# Patient Record
Sex: Male | Born: 1945 | Race: Black or African American | Hispanic: No | Marital: Married | Smoking: Former smoker
Health system: Southern US, Community
[De-identification: ages and names within clinical notes are randomized; demographics above are authoritative.]

## PROBLEM LIST (undated history)

## (undated) ENCOUNTER — Emergency Department (HOSPITAL_COMMUNITY): Admission: EM | Payer: Medicare Other | Source: Home / Self Care

## (undated) DIAGNOSIS — I739 Peripheral vascular disease, unspecified: Secondary | ICD-10-CM

## (undated) DIAGNOSIS — H3530 Unspecified macular degeneration: Secondary | ICD-10-CM

## (undated) DIAGNOSIS — M869 Osteomyelitis, unspecified: Secondary | ICD-10-CM

## (undated) DIAGNOSIS — J189 Pneumonia, unspecified organism: Secondary | ICD-10-CM

## (undated) DIAGNOSIS — I96 Gangrene, not elsewhere classified: Secondary | ICD-10-CM

## (undated) DIAGNOSIS — Z95810 Presence of automatic (implantable) cardiac defibrillator: Secondary | ICD-10-CM

## (undated) DIAGNOSIS — I428 Other cardiomyopathies: Secondary | ICD-10-CM

## (undated) DIAGNOSIS — I10 Essential (primary) hypertension: Secondary | ICD-10-CM

## (undated) DIAGNOSIS — R0602 Shortness of breath: Secondary | ICD-10-CM

## (undated) DIAGNOSIS — D649 Anemia, unspecified: Secondary | ICD-10-CM

## (undated) DIAGNOSIS — N186 End stage renal disease: Secondary | ICD-10-CM

## (undated) DIAGNOSIS — K219 Gastro-esophageal reflux disease without esophagitis: Secondary | ICD-10-CM

## (undated) DIAGNOSIS — M109 Gout, unspecified: Secondary | ICD-10-CM

## (undated) DIAGNOSIS — L97529 Non-pressure chronic ulcer of other part of left foot with unspecified severity: Secondary | ICD-10-CM

## (undated) DIAGNOSIS — I509 Heart failure, unspecified: Secondary | ICD-10-CM

## (undated) DIAGNOSIS — E7800 Pure hypercholesterolemia, unspecified: Secondary | ICD-10-CM

## (undated) DIAGNOSIS — Z992 Dependence on renal dialysis: Secondary | ICD-10-CM

## (undated) DIAGNOSIS — Z973 Presence of spectacles and contact lenses: Secondary | ICD-10-CM

## (undated) DIAGNOSIS — R011 Cardiac murmur, unspecified: Secondary | ICD-10-CM

## (undated) DIAGNOSIS — Z972 Presence of dental prosthetic device (complete) (partial): Secondary | ICD-10-CM

## (undated) DIAGNOSIS — E11621 Type 2 diabetes mellitus with foot ulcer: Secondary | ICD-10-CM

## (undated) DIAGNOSIS — Z950 Presence of cardiac pacemaker: Secondary | ICD-10-CM

## (undated) DIAGNOSIS — M1990 Unspecified osteoarthritis, unspecified site: Secondary | ICD-10-CM

## (undated) HISTORY — DX: Type 2 diabetes mellitus with foot ulcer: E11.621

## (undated) HISTORY — DX: Peripheral vascular disease, unspecified: I73.9

## (undated) HISTORY — PX: MULTIPLE TOOTH EXTRACTIONS: SHX2053

## (undated) HISTORY — DX: Pure hypercholesterolemia, unspecified: E78.00

## (undated) HISTORY — PX: CARDIAC CATHETERIZATION: SHX172

## (undated) HISTORY — PX: INSERT / REPLACE / REMOVE PACEMAKER: SUR710

## (undated) HISTORY — PX: EYE SURGERY: SHX253

## (undated) HISTORY — DX: Anemia, unspecified: D64.9

## (undated) HISTORY — DX: Non-pressure chronic ulcer of other part of left foot with unspecified severity: L97.529

## (undated) HISTORY — DX: Other cardiomyopathies: I42.8

## (undated) HISTORY — DX: Heart failure, unspecified: I50.9

## (undated) HISTORY — PX: CATARACT EXTRACTION W/PHACO: SHX586

## (undated) HISTORY — PX: CARDIAC DEFIBRILLATOR PLACEMENT: SHX171

## (undated) HISTORY — DX: Essential (primary) hypertension: I10

---

## 2002-12-20 ENCOUNTER — Ambulatory Visit (HOSPITAL_BASED_OUTPATIENT_CLINIC_OR_DEPARTMENT_OTHER): Admission: RE | Admit: 2002-12-20 | Discharge: 2002-12-20 | Payer: Self-pay | Admitting: Urology

## 2004-08-22 ENCOUNTER — Emergency Department (HOSPITAL_COMMUNITY): Admission: EM | Admit: 2004-08-22 | Discharge: 2004-08-22 | Payer: Self-pay | Admitting: Family Medicine

## 2004-10-23 ENCOUNTER — Ambulatory Visit (HOSPITAL_COMMUNITY): Admission: RE | Admit: 2004-10-23 | Discharge: 2004-10-23 | Payer: Self-pay | Admitting: Cardiology

## 2004-11-21 ENCOUNTER — Inpatient Hospital Stay (HOSPITAL_BASED_OUTPATIENT_CLINIC_OR_DEPARTMENT_OTHER): Admission: RE | Admit: 2004-11-21 | Discharge: 2004-11-21 | Payer: Self-pay | Admitting: Cardiology

## 2005-01-20 ENCOUNTER — Ambulatory Visit: Payer: Self-pay | Admitting: Internal Medicine

## 2005-04-07 ENCOUNTER — Ambulatory Visit: Payer: Self-pay | Admitting: Infectious Diseases

## 2005-04-07 ENCOUNTER — Inpatient Hospital Stay (HOSPITAL_COMMUNITY): Admission: EM | Admit: 2005-04-07 | Discharge: 2005-04-11 | Payer: Self-pay | Admitting: Emergency Medicine

## 2005-04-16 ENCOUNTER — Encounter (HOSPITAL_COMMUNITY): Admission: RE | Admit: 2005-04-16 | Discharge: 2005-07-15 | Payer: Self-pay | Admitting: Orthopedic Surgery

## 2007-01-08 ENCOUNTER — Ambulatory Visit (HOSPITAL_COMMUNITY): Admission: RE | Admit: 2007-01-08 | Discharge: 2007-01-08 | Payer: Self-pay | Admitting: Cardiology

## 2007-01-08 ENCOUNTER — Ambulatory Visit: Payer: Self-pay | Admitting: Vascular Surgery

## 2009-10-24 ENCOUNTER — Encounter: Payer: Self-pay | Admitting: Internal Medicine

## 2009-11-19 ENCOUNTER — Encounter (HOSPITAL_COMMUNITY): Admission: RE | Admit: 2009-11-19 | Discharge: 2010-01-10 | Payer: Self-pay | Admitting: Cardiology

## 2009-12-25 ENCOUNTER — Encounter: Payer: Self-pay | Admitting: Internal Medicine

## 2010-03-26 ENCOUNTER — Encounter: Payer: Self-pay | Admitting: Internal Medicine

## 2010-03-27 ENCOUNTER — Encounter: Payer: Self-pay | Admitting: Internal Medicine

## 2010-05-02 ENCOUNTER — Encounter: Payer: Self-pay | Admitting: Internal Medicine

## 2010-05-03 DIAGNOSIS — E7800 Pure hypercholesterolemia, unspecified: Secondary | ICD-10-CM | POA: Insufficient documentation

## 2010-05-03 DIAGNOSIS — E119 Type 2 diabetes mellitus without complications: Secondary | ICD-10-CM | POA: Insufficient documentation

## 2010-05-03 DIAGNOSIS — I10 Essential (primary) hypertension: Secondary | ICD-10-CM | POA: Insufficient documentation

## 2010-05-03 DIAGNOSIS — I509 Heart failure, unspecified: Secondary | ICD-10-CM | POA: Insufficient documentation

## 2010-06-05 ENCOUNTER — Ambulatory Visit: Payer: Self-pay | Admitting: Internal Medicine

## 2010-06-06 ENCOUNTER — Telehealth: Payer: Self-pay | Admitting: Internal Medicine

## 2010-06-19 ENCOUNTER — Encounter: Payer: Self-pay | Admitting: Internal Medicine

## 2010-06-21 ENCOUNTER — Ambulatory Visit: Payer: Self-pay | Admitting: Internal Medicine

## 2010-06-21 LAB — CONVERTED CEMR LAB
BUN: 22 mg/dL (ref 6–23)
Basophils Relative: 0.4 % (ref 0.0–3.0)
Chloride: 108 meq/L (ref 96–112)
Creatinine, Ser: 1.7 mg/dL — ABNORMAL HIGH (ref 0.4–1.5)
Eosinophils Relative: 0.9 % (ref 0.0–5.0)
GFR calc non Af Amer: 51.04 mL/min (ref >60)
INR: 0.9 (ref 0.8–1.0)
Lymphocytes Relative: 29.7 % (ref 12.0–46.0)
MCV: 92.9 fL (ref 78.0–100.0)
Monocytes Absolute: 0.4 10*3/uL (ref 0.1–1.0)
Monocytes Relative: 6.2 % (ref 3.0–12.0)
Neutrophils Relative %: 62.8 % (ref 43.0–77.0)
Platelets: 216 10*3/uL (ref 150.0–400.0)
Prothrombin Time: 10.1 s (ref 9.7–11.8)
RBC: 3.65 M/uL — ABNORMAL LOW (ref 4.22–5.81)
WBC: 6.2 10*3/uL (ref 4.5–10.5)
aPTT: 28.7 s (ref 21.7–28.8)

## 2010-06-26 ENCOUNTER — Telehealth: Payer: Self-pay | Admitting: Internal Medicine

## 2010-06-28 ENCOUNTER — Observation Stay (HOSPITAL_COMMUNITY): Admission: RE | Admit: 2010-06-28 | Discharge: 2010-06-29 | Payer: Self-pay | Admitting: Internal Medicine

## 2010-06-28 ENCOUNTER — Ambulatory Visit: Payer: Self-pay | Admitting: Internal Medicine

## 2010-07-03 ENCOUNTER — Encounter: Payer: Self-pay | Admitting: Internal Medicine

## 2010-07-08 ENCOUNTER — Ambulatory Visit: Payer: Self-pay

## 2010-07-08 ENCOUNTER — Encounter: Payer: Self-pay | Admitting: Internal Medicine

## 2010-08-11 HISTORY — PX: INCISION AND DRAINAGE ABSCESS / HEMATOMA OF BURSA / KNEE / THIGH: SUR668

## 2010-09-12 NOTE — Progress Notes (Signed)
Summary: Cardiology/Internal Medicine Office Visit   Cardiology/Internal Medicine Office Visit   Imported By: Sallee Provencal 06/27/2010 09:13:48  _____________________________________________________________________  External Attachment:    Type:   Image     Comment:   External Document

## 2010-09-12 NOTE — Assessment & Plan Note (Signed)
Summary: ec6/hx of cardiomyopathy/last seen in 06   CC:  referal from Dr. Montez Morita.  History of Present Illness: [name] is referred today by Dr. Montez Morita for consideration for prophylactic ICD implant.  The patient has had a longstanding dilated CM.  I saw him initially in 2006 and at that time his EF was 22%.  He has been on maximal medical therapy and his most recent EF by echo was 28%.  He denies c/p.  His CHF remains class 2.  No palpitations or syncope. No c/p.  No significant peripheral edema.  Current Medications (verified): 1)  Eyey Drops .... As Directed 2)  Furosemide 20 Mg Tabs (Furosemide) .... Take One Tablet By Mouth Daily. 3)  Diovan 160 Mg Tabs (Valsartan) .... Take One Tablet By Mouth Daily 4)  Vytorin 10-40 Mg Tabs (Ezetimibe-Simvastatin) .... Take One Tablet By Mouth Dailyat Bedtime 5)  Colcrys 0.6 Mg Tabs (Colchicine) .... As Needed 6)  Bidil 20-37.5 Mg Tabs (Isosorb Dinitrate-Hydralazine) .Marland Kitchen.. 1 Tab By Mouth Two Times A Day 7)  Carvedilol 25 Mg Tabs (Carvedilol) .... Take One Tablet By Mouth Twice A Day 8)  Janumet 50-1000 Mg Tabs (Sitagliptin-Metformin Hcl) .Marland Kitchen.. 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   POSTOPERATIVE DIAGNOSIS:  Left hand deep palmar abscess.   OPERATION PERFORMED:  Incision and drainage of left hand deep palmar  abscess.   SURGEON:  Sheral Apley. Burney Gauze, M.D.    DATE OF PROCEDURE:  04/09/2005                                   OPERATIVE REPORT   PREOPERATIVE DIAGNOSIS:  Deep abscess, left hand.   POSTOPERATIVE DIAGNOSIS:  Deep  abscess, left hand.   PROCEDURE:  Repeat incision and drainage and secondary wound closure, left  hand.   SURGEON:  Sheral Apley. Burney Gauze, M.D.  Family History: Non-contributory.  Social History: Denies ETOH or tobacco abuse.  Review of Systems       All systems reviewed and negative except as noted in the HPI.  Vital Signs:  Patient profile:   65 year old male Height:      68 inches Weight:      209 pounds BMI:     31.89 Pulse rate:   78 / minute Resp:     14 per minute BP sitting:   157 / 85  (left arm)  Vitals Entered By: Burnett Kanaris (June 05, 2010 3:31 PM)  Physical Exam  General:  Obese well developed, well nourished man, in no acute distress.  HEENT: normal Neck: supple. No JVD. Carotids 2+ bilaterally no bruits Cor: RRR no rubs, gallops or murmur. distant heart sounds. Lungs: CTA with no wheezes, rales, or rhonchi. Ab: soft, nontender. nondistended. No HSM. Good bowel sounds Ext: warm. no cyanosis, clubbing or edema Neuro: alert and oriented. Grossly nonfocal. affect pleasant    EKG  Procedure date:  06/05/2010  Findings:      Normal sinus rhythm with rate of: 78. Left ventricular hypertrophy.  Left axis deviation.    Impression & Recommendations:  Problem # 1:  CHF (ICD-428.0) His symptoms remain class 2 despite optimal medical management.  I have discussed the risks/benefits/goals/expectations of ICD implant with the patient and he would like to proceed with device implant. His updated medication list for this problem includes:    Furosemide 20 Mg Tabs (Furosemide) .Marland Kitchen... Take one tablet by mouth daily.    Diovan 160 Mg Tabs (Valsartan) .Marland Kitchen... Take one tablet by mouth daily    Carvedilol 25 Mg Tabs (Carvedilol) .Marland Kitchen... Take one tablet by mouth twice a day  Problem # 2:  HYPERTENSION (ICD-401.9) His blood pressure remains elevated.  We discussed the importance of a low sodium diet and will try to reduce his salt intake. His updated medication list  for this problem includes:    Furosemide 20 Mg Tabs (Furosemide) .Marland Kitchen... Take one tablet by mouth daily.    Diovan 160 Mg Tabs (Valsartan) .Marland Kitchen... Take one tablet by mouth daily    Carvedilol 25 Mg Tabs (Carvedilol) .Marland Kitchen... Take one tablet by mouth twice a day

## 2010-09-12 NOTE — Progress Notes (Signed)
Summary: discuss upcoming surgery  Phone Note Call from Patient Call back at Home Phone [phone]   Caller: Patient- c [phone] Reason for Call: Talk to Nurse Summary of Call: per pt calling, question regarding upcoming surgery.  Initial call taken by: Neil Crouch,  June 26, 2010 10:53 AM  Follow-up for Phone Call        [phone]--# wrong  gave instructions over the phone Janan Halter, RN, BSN  June 26, 2010 2:52 PM

## 2010-09-12 NOTE — Cardiovascular Report (Signed)
Summary: Office Visit   Office Visit   Imported By: Sallee Provencal 07/22/2010 14:45:38  _____________________________________________________________________  External Attachment:    Type:   Image     Comment:   External Document

## 2010-09-12 NOTE — Progress Notes (Signed)
Summary: Cardiology/Internal Medicine Office Visit   Cardiology/Internal Medicine Office Visit   Imported By: Sallee Provencal 06/27/2010 09:15:21  _____________________________________________________________________  External Attachment:    Type:   Image     Comment:   External Document

## 2010-09-12 NOTE — Procedures (Signed)
Summary: wound check.bsx.amber   Current Medications (verified): 1)  Furosemide 20 Mg Tabs (Furosemide) .... Take One Tablet By Mouth Daily. 2)  Diovan 160 Mg Tabs (Valsartan) .... Take One Tablet By Mouth Daily 3)  Vytorin 10-40 Mg Tabs (Ezetimibe-Simvastatin) .... Take One Tablet By Mouth Dailyat Bedtime 4)  Colcrys 0.6 Mg Tabs (Colchicine) .... As Needed 5)  Bidil 20-37.5 Mg Tabs (Isosorb Dinitrate-Hydralazine) .Marland Kitchen.. 1 Tab By Mouth Two Times A Day 6)  Carvedilol 25 Mg Tabs (Carvedilol) .... Take One Tablet By Mouth Twice A Day 7)  Janumet 50-1000 Mg Tabs (Sitagliptin-Metformin Hcl) .Marland Kitchen.. 1 Tab By Mouth Two Times A Day  Allergies (verified): No Known Drug Allergies   ICD Specifications Following MD:  Cristopher Peru, MD     ICD Vendor:  [place] Number:  [id number]     ICD Serial Number:  [id number] ICD DOI:  06/28/2010     ICD Implanting MD:  Cristopher Peru, MD  Lead 1:    Location: RV     DOI: 06/28/2010     Model #: [id number]     Serial #: [id number]     Status: active  Indications::  NICM   ICD Follow Up Battery Voltage:  GOOD V     Charge Time:  8.3 seconds     Battery Est. Longevity:  9 YRS Underlying rhythm:  SR   ICD Device Measurements Right Ventricle:  Amplitude: 25.0 mV, Impedance: 541 ohms, Threshold: 0.9 V at 0.5 msec Shock Impedance: 68 ohms   Episodes MS Episodes:  0     Shock:  0     ATP:  0     Nonsustained:  0     Atrial Therapies:  0 Ventricular Pacing:  <1%  Brady Parameters Mode VVI     Lower Rate Limit:  40      Tachy Zones VF:  190     Next Cardiology Appt Due:  10/01/2010 Tech Comments:  WOUND CHECK---STERI STRIPS REMOVED.  NO REDNESS OR SWELLING AT SITE.  NO EPISODES RECORDED SINCE LAST CHECK.  NO CHANGES MADE. ROV 10-01-10 @ 1520 W/GT. Shelly Bombard  July 08, 2010 4:19 PM

## 2010-09-12 NOTE — Miscellaneous (Signed)
Summary: Device preload  Clinical Lists Changes  Observations: Added new observation of ICD INDICATN: NICM (07/03/2010 10:39) Added new observation of ICDLEADSTAT1: active (07/03/2010 10:39) Added new observation of ICDLEADSER1: [id number]  (07/03/2010 10:39) Added new observation of ICDLEADMOD1: [id number]  (07/03/2010 10:39) Added new observation of ICDLEADLOC1: RV  (07/03/2010 10:39) Added new observation of ICD IMP MD: Cristopher Peru, MD  (07/03/2010 10:39) Added new observation of ICDLEADDOI1: 06/28/2010  (07/03/2010 10:39) Added new observation of ICD IMPL DTE: 06/28/2010  (07/03/2010 10:39) Added new observation of ICD SERL#: [id number]  (07/03/2010 10:39) Added new observation of ICD MODL#: [id number]  (07/03/2010 10:39) Added new observation of ICDMANUFACTR: [employer]  (07/03/2010 10:39) Added new observation of ICD MD: Cristopher Peru, MD  (07/03/2010 10:39)       ICD Specifications Following MD:  Cristopher Peru, MD     ICD Vendor:  [place] Number:  [id number]     ICD Serial Number:  [id number] ICD DOI:  06/28/2010     ICD Implanting MD:  Cristopher Peru, MD  Lead 1:    Location: RV     DOI: 06/28/2010     Model #: [id number]     Serial #: [id number]     Status: active  Indications::  NICM

## 2010-09-12 NOTE — Progress Notes (Signed)
Summary: pt rtn your call  Phone Note Call from Patient Call back at [phone]   Caller: Patient Reason for Call: Talk to Nurse, Talk to Doctor Summary of Call: rtn your call to set up procedure Initial call taken by: Shelda Pal,  June 06, 2010 9:26 AM  Follow-up for Phone Call        returned call to pt and left message to call back to give date for procedure Janan Halter, RN, BSN  June 06, 2010 1:44 PM pt will have ICD implant on 06/28/10 and have labs on 06/21/10 at 9:30am  Pt aware Janan Halter, RN, BSN  June 06, 2010 2:02 PM

## 2010-09-12 NOTE — Cardiovascular Report (Signed)
Summary: Pre Op Orders   Pre Op Orders   Imported By: Sallee Provencal 07/01/2010 16:43:05  _____________________________________________________________________  External Attachment:    Type:   Image     Comment:   External Document

## 2010-09-12 NOTE — Progress Notes (Signed)
Summary: Cardiology/Internal Medicine Office Visit   Cardiology/Internal Medicine Office Visit   Imported By: Sallee Provencal 06/27/2010 09:14:56  _____________________________________________________________________  External Attachment:    Type:   Image     Comment:   External Document

## 2010-09-12 NOTE — Letter (Signed)
Summary: Implantable Device Instructions  Press photographer, [place]  [id number] [place]   [place], [place] [address]   Phone: [phone]  Fax: [phone]      Implantable Device Instructions  You are scheduled for:  _____ Implantable Cardioverter Defibrillator   on 06/28/10 with Dr. Lovena Le.  1.  Please arrive at the [facility] at 5:30am on the day of your procedure.  2.  Do not eat or drink after midnight the night before your procedure.  3.  Complete lab work on 06/21/10 at 9:30am.   You do not have to be fasting.  4.  Do NOT take these medications for the morning of your procedure:  Furosemide and Janumet.    5.  Plan for an overnight stay.  Bring your insurance cards and a list of your medications.  6.  Wash your chest and neck with antibacterial soap (any brand) the evening before and the morning of your procedure.  Rinse well.  7.  Education material received:               ICD           *If you have ANY questions after you get home, please call the office [phone].  Leonia Reader  *Every attempt is made to prevent procedures from being rescheduled.  Due to the nauture of Electrophysiology, rescheduling can happen.  The physician is always aware and directs the staff when this occurs.

## 2010-09-12 NOTE — Progress Notes (Signed)
Summary: Cardiology/Internal Medicine Office Visit   Cardiology/Internal Medicine Office Visit   Imported By: Sallee Provencal 06/27/2010 09:14:40  _____________________________________________________________________  External Attachment:    Type:   Image     Comment:   External Document

## 2010-10-01 ENCOUNTER — Encounter: Payer: Self-pay | Admitting: Internal Medicine

## 2010-10-01 ENCOUNTER — Encounter (INDEPENDENT_AMBULATORY_CARE_PROVIDER_SITE_OTHER): Payer: Medicare Other | Admitting: Internal Medicine

## 2010-10-01 DIAGNOSIS — Z95810 Presence of automatic (implantable) cardiac defibrillator: Secondary | ICD-10-CM | POA: Insufficient documentation

## 2010-10-01 DIAGNOSIS — I428 Other cardiomyopathies: Secondary | ICD-10-CM

## 2010-10-01 DIAGNOSIS — I5022 Chronic systolic (congestive) heart failure: Secondary | ICD-10-CM

## 2010-10-08 NOTE — Assessment & Plan Note (Signed)
Summary: defib/check/gdt.amber   Visit Type:  Follow-up   History of Present Illness: Timothy Faulkner returns today for followup. He is a pleasant 65 yo man with a h/o DCM, CHF, HTN, and is s/p ICD implant. He denies c/p, sob, or peripheral edema. He wants to start back exercising and playing golf. His CHF is class 2. No ICD shocks.  Current Medications (verified): 1)  Furosemide 20 Mg Tabs (Furosemide) .... Take One Tablet By Mouth Daily. 2)  Diovan 160 Mg Tabs (Valsartan) .... Take One Tablet By Mouth Daily 3)  Vytorin 10-40 Mg Tabs (Ezetimibe-Simvastatin) .... Take One Tablet By Mouth Dailyat Bedtime 4)  Colcrys 0.6 Mg Tabs (Colchicine) .... As Needed 5)  Bidil 20-37.5 Mg Tabs (Isosorb Dinitrate-Hydralazine) .Marland Kitchen.. 1 Tab By Mouth Two Times A Day 6)  Carvedilol 25 Mg Tabs (Carvedilol) .... Take One Tablet By Mouth Twice A Day 7)  Janumet 50-1000 Mg Tabs (Sitagliptin-Metformin Hcl) .Marland Kitchen.. 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   POSTOPERATIVE DIAGNOSIS:  Left hand deep palmar abscess.   OPERATION PERFORMED:  Incision and drainage of left hand deep palmar  abscess.   SURGEON:  Sheral Apley. Burney Gauze, M.D.    DATE OF PROCEDURE:  04/09/2005                                   OPERATIVE REPORT   PREOPERATIVE DIAGNOSIS:  Deep abscess, left hand.   POSTOPERATIVE DIAGNOSIS:  Deep abscess, left hand.   PROCEDURE:  Repeat incision and drainage and secondary wound closure, left  hand.   SURGEON:  Sheral Apley. Burney Gauze, M.D.  Review of  Systems  The patient denies chest pain, syncope, dyspnea on exertion, and peripheral edema.    Vital Signs:  Patient profile:   65 year old male Height:      68 inches Weight:      136 pounds BMI:     20.75 Pulse rate:   60 / minute BP sitting:   98 / 60  (right arm)  Vitals Entered By: Margaretmary Bayley CMA (October 01, 2010 3:43 PM)  Physical Exam  General:  Obese well developed, well nourished man, in no acute distress.  HEENT: normal Neck: supple. No JVD. Carotids 2+ bilaterally no bruits Cor: RRR no rubs, gallops or murmur. distant heart sounds. Well healed ICD incision. Lungs: CTA with no wheezes, rales, or rhonchi. Ab: soft, nontender. nondistended. No HSM. Good bowel sounds Ext: warm. no cyanosis, clubbing or edema Neuro: alert and oriented. Grossly nonfocal. affect pleasant     ICD Specifications Following MD:  Cristopher Peru, MD     ICD Vendor:  [employer] Number:  [id number]     ICD Serial Number:  [id number] ICD DOI:  06/28/2010     ICD Implanting MD:  Cristopher Peru, MD  Lead 1:    Location:  RV     DOI: 06/28/2010     Model #: [id number]     Serial #: [id number]     Status: active  Indications::  NICM   Brady Parameters Mode VVI     Lower Rate Limit:  40      Tachy Zones VF:  190     MD Comments:  Normal device function.  Impression & Recommendations:  Problem # 1:  AUTOMATIC IMPLANTABLE CARDIAC DEFIBRILLATOR SITU (ICD-V45.02) His device is working normally. Will recheck in several months.  Problem # 2:  CHF (ICD-428.0) His chronic systolic CHF is class 2. He will maintain a low sodium diet and continue his daily meds. His updated medication list for this problem includes:    Furosemide 20 Mg Tabs (Furosemide) .Marland Kitchen... Take one tablet by mouth daily.    Diovan 160 Mg Tabs (Valsartan) .Marland Kitchen... Take one tablet by mouth daily    Carvedilol 25 Mg Tabs (Carvedilol) .Marland Kitchen... 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 His updated medication list for this problem includes:    Vytorin 10-40 Mg Tabs (Ezetimibe-simvastatin) .Marland Kitchen... Take one tablet by mouth dailyat bedtime  Patient Instructions: 1)  Your physician recommends that you continue on your current medications as directed. Please refer to the Current Medication list given to you today. 2)  Your physician wants you to follow-up in:9 months with Dr. Lovena Le and [place] 01/02/11   You will receive a reminder letter in the mail two months in advance. If you don't receive a letter, please call our office to schedule the follow-up appointment.

## 2010-10-17 NOTE — Cardiovascular Report (Signed)
Summary: Office Visit   Office Visit   Imported By: Sallee Provencal 10/11/2010 12:49:10  _____________________________________________________________________  External Attachment:    Type:   Image     Comment:   External Document

## 2010-10-22 LAB — GLUCOSE, CAPILLARY
Glucose-Capillary: 156 mg/dL — ABNORMAL HIGH (ref 70–99)
Glucose-Capillary: 177 mg/dL — ABNORMAL HIGH (ref 70–99)

## 2010-10-22 LAB — SURGICAL PCR SCREEN
MRSA, PCR: NEGATIVE
Staphylococcus aureus: POSITIVE — AB

## 2010-12-27 NOTE — Op Note (Signed)
NAMELORRY, IZQUIERDO               ACCOUNT NO.:  [id number]   MEDICAL RECORD NO.:  [id number]          PATIENT TYPE:  INP   LOCATION:  [id number]                         FACILITY:  [facility]   PHYSICIAN:  Sheral Apley. Weingold, M.D.DATE OF BIRTH:  1945-09-29   DATE OF PROCEDURE:  04/09/2005  DATE OF DISCHARGE:                                 OPERATIVE REPORT   PREOPERATIVE DIAGNOSIS:  Deep abscess, left hand.   POSTOPERATIVE DIAGNOSIS:  Deep abscess, left hand.   PROCEDURE:  Repeat incision and drainage and secondary wound closure, left  hand.   SURGEON:  Sheral Apley. Burney Gauze, M.D.   ASSISTANT:  None.   ANESTHESIA:  General.   TOURNIQUET TIME:  Was 15 minutes.   COMPLICATIONS:  None.   DRAINS:  None.   DESCRIPTION OF PROCEDURE:  The patient was taken to the operating room and  underwent the induction of adequate general anesthesia.  The left upper  extremity was prepped and draped in the usual sterile fashion.  An Esmarch  was used to exsanguinate the limb.  The tourniquet was then inflated to 250  mmHg.  At this point in time the left hand, which had been previously packed  open with Iodoform gauze was approached.  The Iodoform gauze was removed.  It was irrigated with 3 liters of normal saline using pulse lavage.  It was  debrided.  It was then loosely closed with #4-0 nylon.  A sterile dressing  of Xeroform, 4 x 4's, Kerlix and Kling with a compressive wrap was applied.   The patient tolerated the procedure well and was taken to the PAR in stable  fashion.      Sheral Apley Burney Gauze, M.D.  Electronically Signed     MAW/MEDQ  D:  04/09/2005  T:  04/09/2005  Job:  [id number]

## 2010-12-27 NOTE — Op Note (Signed)
Timothy Faulkner, Timothy Faulkner               ACCOUNT NO.:  [id number]   MEDICAL RECORD NO.:  [id number]          PATIENT TYPE:  INP   LOCATION:  [id number]                         FACILITY:  [facility]   PHYSICIAN:  Sheral Apley. Weingold, M.D.DATE OF BIRTH:  1946/07/10   DATE OF PROCEDURE:  04/07/2005  DATE OF DISCHARGE:                                 OPERATIVE REPORT   PREOPERATIVE DIAGNOSIS:  Left hand deep palmar abscess.   POSTOPERATIVE DIAGNOSIS:  Left hand deep palmar abscess.   OPERATION PERFORMED:  Incision and drainage of left hand deep palmar  abscess.   SURGEON:  Sheral Apley. Burney Gauze, M.D.   ASSISTANT:  None.   ANESTHESIA:  General.   TOURNIQUET TIME:  20 minutes.   COMPLICATIONS:  No.   The wound was packed open with quarter inch iodoform.  Cultures times two  were sent.   DESCRIPTION OF PROCEDURE:  The patient was taken to the operating room and  after the induction of adequate general anesthesia, the left upper extremity  was prepped and draped in the usual sterile fashion.  Esmarch was used to  exsanguinate the limb. Tourniquet inflated to 250 mmHg.  At this time  Brunner's incision was made over the ulnar side of the left hand starting in  the area of the hyperthenar eminence going out to the level of the proximal  phalanx.  Flaps were raised accordingly.  Copious amounts of purulent  material were encountered over the flexor sheath of the fifth digit and also  in the area of the fourth digit metacarpophalangeal joint.  Flaps were  raised accordingly.  Dissection was carried down to the neurovascular  bundles.  These were identified and retracted. The wound was then thoroughly  irrigated with 6 liters of normal saline using pulse lavage.  It was packed  open with quarter inch iodoform packing and placed in a sterile dressing of  4 x 4's, fluffs and compressive wrap.  The patient tolerated the procedure  well, went to recovery room in stable fashion.      Sheral Apley Burney Gauze,  M.D.  Electronically Signed     MAW/MEDQ  D:  04/07/2005  T:  04/08/2005  Job:  [id number]

## 2010-12-27 NOTE — Cardiovascular Report (Signed)
Timothy Faulkner, Timothy Faulkner               ACCOUNT NO.:  [id number]   MEDICAL RECORD NO.:  [id number]          PATIENT TYPE:  OIB   LOCATION:  6501                         FACILITY:  [facility]   PHYSICIAN:  Mohan N. Terrence Dupont, M.D. DATE OF BIRTH:  [birth year]   DATE OF PROCEDURE:  11/21/2004  DATE OF DISCHARGE:                              CARDIAC CATHETERIZATION   DATE OF PROCEDURE:  11/21/2004.   PROCEDURE:  Left cardiac catheterization with selective left and right  coronary angiography via the right groin using Judkins technique.   INDICATIONS FOR PROCEDURE:  Timothy Faulkner is a 65 year old black male with  past medical history significant for hypertension, non-insulin dependent  diabetes mellitus, history of congestive heart failure, history of tobacco  abuse, alcohol abuse and positive family history of coronary artery disease.  Complains of exertional dyspnea with minimal exertion associated with  feeling weak and tired and fatigued. He states lately in last few weeks has  noticed progressive increasing shortness of breath with climbing a few  steps. Denies any chest pain, nausea, vomiting, diaphoresis. The patient  gives history of PND, orthopnea and leg swelling. Denies palpitations,  lightheadedness or syncope. The patient underwent stress Myoview on October 23, 2004 and exercised for 4.17 minutes on Bruce protocol. He complained of  fatigue and shortness of breath. EKG showed normal sinus rhythm with ST  depression in lateral leads at baseline, there were no new acute ischemic  changes noted and Cardiolite scan showed no evidence of reversible ischemia  but due to the recent history of CHF, abnormal EKG and significantly  depressed LV systolic function, the patient is referred for left  catheterization  and possible ICD if catheterization is negative or with  significant coronary artery disease.   PAST MEDICAL HISTORY:  As above. Past surgical history none.   SOCIAL HISTORY:  Married,  has two children. Smoked one pack per day for 25+  years but quit 4 years ago. Drinks whiskey two to three shots per week for  15+ years, quit recently. He works in [employer] and lives in  [place].   FAMILY HISTORY:  Father died of MI at the age of 14. Mother is alive at age  15. She has one brother and sister in good health.   ALLERGIES:  No known drug allergies.   MEDICATIONS:  1  Diovan 320 mg  p.o. daily.  1.  Coreg 12.5 mg p.o. q.12h.  2.  Inspra 25 milligrams p.o. daily.  3.  Fortamet 1000 mg b.i.d.  4.  Baby aspirin  81 milligrams daily.  5.  ____ 1 tablet p.o. t.i.d. most recently which he stopped.   PHYSICAL EXAMINATION:  GENERAL: He is alert and oriented x3 in, no acute  distress.  VITAL SIGNS: Blood pressure was 130/76, pulse was 72, regular.  HEENT:  Conjunctivae was pink.  NECK:  Supple, no jugular venous distension, no bruit.  LUNGS:  Clear to auscultation without rhonchi  CARDIOVASCULAR:  S1, S2 was soft, there was no S3 gallop.  ABDOMEN: Soft, bowel sounds are present, obese, nontender.  EXTREMITIES: There is no  clubbing, cyanosis or edema   IMPRESSION:  Exertional dyspnea with an abnormal EKG. Rule out coronary  insufficiency, dilated cardiomyopathy, compensated congestive heart failure,  hypertensive heart disease and systolic dysfunction. Non-insulin-dependent  diabetes mellitus, history of tobacco abuse and alcohol abuse .Positive  family history of coronary artery disease. Discussed with the patient  regarding left cath, its risks and benefits i.e. death, MI, stroke with  emergency CABG.  Risk of congestive heart failure, localized complications  and patient ,consented for the procedure.   PROCEDURE:  After obtaining informed consent the patient was brought to the  cath lab and was placed on fluoroscopy table. The right groin was prepped  and draped in usual fashion. Two percent Xylocaine was used for local  anesthesia in the right groin. With  a thin-wall needle A 4-French arterial  sheath was placed. The sheath was aspirated and flushed. Next, 4-French left  Judkins catheter was advanced over the wire under fluoroscopic guidance into  the ascending aorta. Wire was pulled out, the catheter was aspirated and  connected to the manifold. Catheter was further advanced and engaged into  left coronary ostium.  Views of the left system were taken. Next the  catheter was disengaged and was pulled out over the wire was replaced with 4-  [place] right Judkins catheter which was advanced over the wire under  fluoroscopic guidance up to the ascending aorta. Wire was pulled out the  catheter was aspirated and connected to the manifold. Catheter was further  advanced and engaged into right coronary ostium. Views of the right system  were taken. Next the catheter was disengaged and was pulled out over the  wire and was replaced with 4-French pigtail catheter which was advanced over  the wire under fluoroscopic guidance up to the ascending aorta. Wire was  pulled out, the catheter was aspirated and connected to the manifold.  Catheter was further advanced across aortic valve into the left ventricle,  LV pressures were recorded. Next,  LV graft was done in 30 degree RAO  position. Post angiographic pressures were recorded from LV and then  pullback pressures were recorded from the aorta. There was no significant  gradient across aortic valve. Next a pigtail catheter was pulled out over  the wire. Sheaths were aspirated and flushed.   FINDINGS:  LV showed LV was mildly enlarged. There was severe global  hypokinesia with an ejection fraction of 15-20% , left main was patent. LAD  has 30-40% mid focal stenosis and 15-20% distal stenosis. Diagonal one and  two were very small which were patent. Left circumflex was patent. OM-1 was  less than 1 mm which was very, very small. OM-2 and OM-3 very small which were patent. RCA has 10-15% proximal stenosis.  The patient tolerated  procedure well. There are no complications. The patient was transferred to  recovery room in stable condition. The patient will be referred as an  outpatient for ICD placement.      MNH/MEDQ  D:  11/21/2004  T:  11/21/2004  Job:  [id number]   cc:   Ardyth Gal. Spruill, M.D.  [address] [address]  Fax: [phone]
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
## 2011-01-02 ENCOUNTER — Ambulatory Visit (INDEPENDENT_AMBULATORY_CARE_PROVIDER_SITE_OTHER): Payer: Medicare Other | Admitting: *Deleted

## 2011-01-02 ENCOUNTER — Other Ambulatory Visit: Payer: Self-pay | Admitting: Internal Medicine

## 2011-01-02 DIAGNOSIS — I428 Other cardiomyopathies: Secondary | ICD-10-CM

## 2011-02-13 NOTE — Progress Notes (Signed)
ICD remote 

## 2011-04-03 ENCOUNTER — Ambulatory Visit (INDEPENDENT_AMBULATORY_CARE_PROVIDER_SITE_OTHER): Payer: Medicare Other | Admitting: *Deleted

## 2011-04-03 ENCOUNTER — Other Ambulatory Visit: Payer: Self-pay | Admitting: Internal Medicine

## 2011-04-03 DIAGNOSIS — Z95810 Presence of automatic (implantable) cardiac defibrillator: Secondary | ICD-10-CM

## 2011-04-03 DIAGNOSIS — I428 Other cardiomyopathies: Secondary | ICD-10-CM

## 2011-04-10 LAB — REMOTE ICD DEVICE
BRDY-0002RV: 40 {beats}/min
DEVICE MODEL ICD: 268731
FVT: 0
HV IMPEDENCE: 73 Ohm
TOT-0006: 20120524000000
VENTRICULAR PACING ICD: 0 pct
VF: 0

## 2011-04-22 NOTE — Progress Notes (Signed)
ICD checked by remote. 

## 2011-04-23 ENCOUNTER — Encounter: Payer: Self-pay | Admitting: *Deleted

## 2011-07-14 ENCOUNTER — Encounter: Payer: Self-pay | Admitting: *Deleted

## 2011-07-15 ENCOUNTER — Encounter: Payer: Self-pay | Admitting: Internal Medicine

## 2011-07-15 ENCOUNTER — Ambulatory Visit (INDEPENDENT_AMBULATORY_CARE_PROVIDER_SITE_OTHER): Payer: Medicare Other | Admitting: Internal Medicine

## 2011-07-15 DIAGNOSIS — I428 Other cardiomyopathies: Secondary | ICD-10-CM

## 2011-07-15 DIAGNOSIS — Z95810 Presence of automatic (implantable) cardiac defibrillator: Secondary | ICD-10-CM

## 2011-07-15 DIAGNOSIS — I10 Essential (primary) hypertension: Secondary | ICD-10-CM

## 2011-07-15 DIAGNOSIS — I509 Heart failure, unspecified: Secondary | ICD-10-CM

## 2011-07-15 LAB — ICD DEVICE OBSERVATION
BRDY-0002RV: 40 {beats}/min
HV IMPEDENCE: 76 Ohm
RV LEAD THRESHOLD: 0.8 V
VENTRICULAR PACING ICD: 1 pct

## 2011-07-15 NOTE — Assessment & Plan Note (Signed)
His blood pressure remains well-controlled. He'll continue a low-sodium diet and his current medical therapy

## 2011-07-15 NOTE — Patient Instructions (Signed)
Your physician wants you to follow-up in: 12 months with Dr Knox Saliva will receive a reminder letter in the mail two months in advance. If you don't receive a letter, please call our office to schedule the follow-up appointment.  Remote monitoring is used to monitor your Pacemaker of ICD from home. This monitoring reduces the number of office visits required to check your device to one time per year. It allows [place] to keep an eye on the functioning of your device to ensure it is working properly. You are scheduled for a device check from home on 10/16/2011. You may send your transmission at any time that day. If you have a wireless device, the transmission will be sent automatically. After your physician reviews your transmission, you will receive a postcard with your next transmission date.

## 2011-07-15 NOTE — Assessment & Plan Note (Signed)
His device is working normally. Plan to recheck in several months. 

## 2011-07-15 NOTE — Progress Notes (Signed)
HPI Timothy Faulkner returns today for followup. He is a very pleasant 65 year old man with an ischemic cardiomyopathy, chronic systolic heart failure, dyslipidemia, and hypertension. He is status post ICD implantation approximately one year ago. He has had no ICD discharges. He denies syncope or peripheral edema. No Known Allergies   Current Outpatient Prescriptions  Medication Sig Dispense Refill  . carvedilol (COREG) 25 MG tablet Take 25 mg by mouth 2 (two) times daily with a meal.        . colchicine (COLCRYS) 0.6 MG tablet Take 0.6 mg by mouth as needed.        . ezetimibe-simvastatin (VYTORIN) 10-40 MG per tablet Take 1 tablet by mouth at bedtime.        . furosemide (LASIX) 20 MG tablet Take 20 mg by mouth daily.        . isosorbide-hydrALAZINE (BIDIL) 20-37.5 MG per tablet Take 1 tablet by mouth 2 (two) times daily.        . sitaGLIPtan-metformin (JANUMET) 50-1000 MG per tablet Take 1 tablet by mouth 2 (two) times daily with a meal.        . valsartan (DIOVAN) 160 MG tablet Take 160 mg by mouth daily.           Past Medical History  Diagnosis Date  . HYPERTENSION   . HYPERCHOLESTEROLEMIA, MIXED   . CHF   . DIAB W/O COMP TYPE II/UNS NOT STATED UNCNTRL   . AUTOMATIC IMPLANTABLE CARDIAC DEFIBRILLATOR SITU   . Other primary cardiomyopathies 07/16/2011    ROS:   All systems reviewed and negative except as noted in the HPI.   Past Surgical History  Procedure Date  . Cardiac defibrillator placement     [employer]  . Abcess drainage      No family history on file.   History   Social History  . Marital Status: Married    Spouse Name: N/A    Number of Children: N/A  . Years of Education: N/A   Occupational History  . Not on file.   Social History Main Topics  . Smoking status: Never Smoker   . Smokeless tobacco: Not on file  . Alcohol Use: No  . Drug Use: No  . Sexually Active: Not on file   Other Topics Concern  . Not on file   Social History Narrative   . No narrative on file     BP 130/74  Pulse 64  Ht 5\' 8"  (1.727 m)  Wt 96.163 kg (212 lb)  BMI 32.23 kg/m2  Physical Exam:  Well appearing middle-aged man, NAD HEENT: Unremarkable Neck:  No JVD, no thyromegally Lungs:  Clear with no wheezes, rales, or rhonchi. Well-healed ICD incision. HEART:  Regular rate rhythm, no murmurs, no rubs, no clicks Abd:  soft, positive bowel sounds, no organomegally, no rebound, no guarding Ext:  2 plus pulses, no edema, no cyanosis, no clubbing Skin:  No rashes no nodules Neuro:  CN II through XII intact, motor grossly intact  DEVICE  Normal device function.  See PaceArt for details.   Assess/Plan:

## 2011-07-15 NOTE — Assessment & Plan Note (Signed)
As can just have heart failure symptoms are class II. He will continue his current medical therapy and maintain a low-sodium diet. I have encouraged him to walk regularly.

## 2011-07-16 DIAGNOSIS — I428 Other cardiomyopathies: Secondary | ICD-10-CM

## 2011-07-16 HISTORY — DX: Other cardiomyopathies: I42.8

## 2011-10-16 ENCOUNTER — Encounter: Payer: Self-pay | Admitting: Internal Medicine

## 2011-10-16 ENCOUNTER — Ambulatory Visit (INDEPENDENT_AMBULATORY_CARE_PROVIDER_SITE_OTHER): Payer: Medicare Other | Admitting: *Deleted

## 2011-10-16 DIAGNOSIS — Z95810 Presence of automatic (implantable) cardiac defibrillator: Secondary | ICD-10-CM

## 2011-10-16 DIAGNOSIS — I428 Other cardiomyopathies: Secondary | ICD-10-CM

## 2011-10-17 LAB — REMOTE ICD DEVICE
BRDY-0002RV: 40 {beats}/min
CHARGE TIME: 8.5 s
DEVICE MODEL ICD: 268731
RV LEAD AMPLITUDE: 25 mv
TOT-0006: 20121204000000
VENTRICULAR PACING ICD: 0 pct

## 2011-10-22 NOTE — Progress Notes (Signed)
Remote icd check  

## 2011-11-14 ENCOUNTER — Encounter: Payer: Self-pay | Admitting: Internal Medicine

## 2012-01-15 ENCOUNTER — Encounter: Payer: Self-pay | Admitting: Internal Medicine

## 2012-01-15 ENCOUNTER — Ambulatory Visit (INDEPENDENT_AMBULATORY_CARE_PROVIDER_SITE_OTHER): Payer: Medicare Other | Admitting: *Deleted

## 2012-01-15 ENCOUNTER — Other Ambulatory Visit: Payer: Self-pay | Admitting: Internal Medicine

## 2012-01-15 DIAGNOSIS — Z95810 Presence of automatic (implantable) cardiac defibrillator: Secondary | ICD-10-CM

## 2012-01-15 DIAGNOSIS — I509 Heart failure, unspecified: Secondary | ICD-10-CM

## 2012-01-20 LAB — REMOTE ICD DEVICE
BRDY-0002RV: 40 {beats}/min
CHARGE TIME: 8.6 s
DEV-0020ICD: NEGATIVE
FVT: 0
RV LEAD AMPLITUDE: 25 mv
TOT-0006: 20130307000000

## 2012-02-02 ENCOUNTER — Encounter: Payer: Self-pay | Admitting: *Deleted

## 2012-04-29 ENCOUNTER — Encounter: Payer: Medicare Other | Admitting: *Deleted

## 2012-05-04 ENCOUNTER — Encounter: Payer: Self-pay | Admitting: *Deleted

## 2012-07-15 ENCOUNTER — Encounter: Payer: Self-pay | Admitting: *Deleted

## 2012-07-20 ENCOUNTER — Encounter: Payer: Medicare Other | Admitting: Internal Medicine

## 2012-07-21 ENCOUNTER — Encounter: Payer: Self-pay | Admitting: Internal Medicine

## 2012-07-21 ENCOUNTER — Ambulatory Visit (INDEPENDENT_AMBULATORY_CARE_PROVIDER_SITE_OTHER): Payer: Medicare Other | Admitting: Internal Medicine

## 2012-07-21 VITALS — BP 132/82 | HR 69 | Ht 68.0 in | Wt 208.0 lb

## 2012-07-21 DIAGNOSIS — Z95810 Presence of automatic (implantable) cardiac defibrillator: Secondary | ICD-10-CM

## 2012-07-21 DIAGNOSIS — I10 Essential (primary) hypertension: Secondary | ICD-10-CM

## 2012-07-21 LAB — ICD DEVICE OBSERVATION
DEV-0020ICD: NEGATIVE
HV IMPEDENCE: 61 Ohm

## 2012-07-21 NOTE — Patient Instructions (Signed)
Your physician wants you to follow-up in: 12 months with Dr Knox Saliva will receive a reminder letter in the mail two months in advance. If you don't receive a letter, please call our office to schedule the follow-up appointment.  Remote monitoring is used to monitor your Pacemaker of ICD from home. This monitoring reduces the number of office visits required to check your device to one time per year. It allows [place] to keep an eye on the functioning of your device to ensure it is working properly. You are scheduled for a device check from home on 10/28/12. You may send your transmission at any time that day. If you have a wireless device, the transmission will be sent automatically. After your physician reviews your transmission, you will receive a postcard with your next transmission date.

## 2012-07-22 ENCOUNTER — Encounter: Payer: Self-pay | Admitting: Internal Medicine

## 2012-07-22 NOTE — Assessment & Plan Note (Signed)
His blood pressure is well controlled. He will continue his current medications and I have asked him to reduce his sodium intake.

## 2012-07-22 NOTE — Progress Notes (Signed)
HPI [name] returns today for followup. He is a pleasant 66 yo man with a h/o chronic systolic heart failure, HTN, and dyslipidemia. He is s/p ICD implant. He denies chest pain or sob. No syncope or ICD shock.  No Known Allergies   Current Outpatient Prescriptions  Medication Sig Dispense Refill  . carvedilol (COREG) 25 MG tablet Take 25 mg by mouth 2 (two) times daily with a meal.        . colchicine (COLCRYS) 0.6 MG tablet Take 0.6 mg by mouth as needed.        . ezetimibe-simvastatin (VYTORIN) 10-40 MG per tablet Take 1 tablet by mouth at bedtime.        . furosemide (LASIX) 20 MG tablet Take 20 mg by mouth daily.        . isosorbide-hydrALAZINE (BIDIL) 20-37.5 MG per tablet Take 1 tablet by mouth 2 (two) times daily.           Past Medical History  Diagnosis Date  . HYPERTENSION   . HYPERCHOLESTEROLEMIA, MIXED   . CHF   . DIAB W/O COMP TYPE II/UNS NOT STATED UNCNTRL   . AUTOMATIC IMPLANTABLE CARDIAC DEFIBRILLATOR SITU   . Other primary cardiomyopathies 07/16/2011    ROS:   All systems reviewed and negative except as noted in the HPI.   Past Surgical History  Procedure Date  . Cardiac defibrillator placement     [employer]  . Abcess drainage      No family history on file.   History   Social History  . Marital Status: Married    Spouse Name: N/A    Number of Children: N/A  . Years of Education: N/A   Occupational History  . Not on file.   Social History Main Topics  . Smoking status: Never Smoker   . Smokeless tobacco: Not on file  . Alcohol Use: No  . Drug Use: No  . Sexually Active: Not on file   Other Topics Concern  . Not on file   Social History Narrative  . No narrative on file     BP 132/82  Pulse 69  Ht 5\' 8"  (1.727 m)  Wt 208 lb (94.348 kg)  BMI 31.63 kg/m2  Physical Exam:  Well appearing middle aged woman, NAD HEENT: Unremarkable Neck:  No JVD, no thyromegally Lungs:  Clear with no wheezes, rales, or rhonchi.   HEART:  Regular rate rhythm, no murmurs, no rubs, no clicks Abd:  soft, positive bowel sounds, no organomegally, no rebound, no guarding Ext:  2 plus pulses, no edema, no cyanosis, no clubbing Skin:  No rashes no nodules Neuro:  CN II through XII intact, motor grossly intact  EKG -  NSR with IVCD  DEVICE  Normal device function.  See PaceArt for details.   Assess/Plan:

## 2012-07-22 NOTE — Assessment & Plan Note (Signed)
His device is working normally. Will recheck in several months. 

## 2012-08-23 ENCOUNTER — Telehealth: Payer: Self-pay | Admitting: Internal Medicine

## 2012-08-23 ENCOUNTER — Inpatient Hospital Stay (HOSPITAL_COMMUNITY)
Admission: EM | Admit: 2012-08-23 | Discharge: 2012-08-26 | DRG: 682 | Disposition: A | Discharge status: Home Health | Payer: Medicare Other | Attending: Cardiology | Admitting: Cardiology

## 2012-08-23 ENCOUNTER — Emergency Department (INDEPENDENT_AMBULATORY_CARE_PROVIDER_SITE_OTHER): Payer: Medicare Other

## 2012-08-23 ENCOUNTER — Emergency Department (HOSPITAL_COMMUNITY)
Admission: EM | Admit: 2012-08-23 | Discharge: 2012-08-23 | Disposition: A | Discharge status: Nursing Home (Includes ICF) | Payer: Medicare Other | Source: Home / Self Care | Attending: Emergency Medicine | Admitting: Emergency Medicine

## 2012-08-23 ENCOUNTER — Encounter (HOSPITAL_COMMUNITY): Payer: Self-pay | Admitting: Emergency Medicine

## 2012-08-23 ENCOUNTER — Encounter (HOSPITAL_COMMUNITY): Payer: Self-pay | Admitting: *Deleted

## 2012-08-23 DIAGNOSIS — M109 Gout, unspecified: Secondary | ICD-10-CM | POA: Diagnosis present

## 2012-08-23 DIAGNOSIS — N289 Disorder of kidney and ureter, unspecified: Secondary | ICD-10-CM

## 2012-08-23 DIAGNOSIS — Z79899 Other long term (current) drug therapy: Secondary | ICD-10-CM

## 2012-08-23 DIAGNOSIS — N179 Acute kidney failure, unspecified: Principal | ICD-10-CM | POA: Diagnosis present

## 2012-08-23 DIAGNOSIS — I2589 Other forms of chronic ischemic heart disease: Secondary | ICD-10-CM | POA: Diagnosis present

## 2012-08-23 DIAGNOSIS — E782 Mixed hyperlipidemia: Secondary | ICD-10-CM | POA: Diagnosis present

## 2012-08-23 DIAGNOSIS — E7800 Pure hypercholesterolemia, unspecified: Secondary | ICD-10-CM | POA: Diagnosis present

## 2012-08-23 DIAGNOSIS — G4730 Sleep apnea, unspecified: Secondary | ICD-10-CM

## 2012-08-23 DIAGNOSIS — Z95810 Presence of automatic (implantable) cardiac defibrillator: Secondary | ICD-10-CM

## 2012-08-23 DIAGNOSIS — I509 Heart failure, unspecified: Secondary | ICD-10-CM | POA: Diagnosis present

## 2012-08-23 DIAGNOSIS — E119 Type 2 diabetes mellitus without complications: Secondary | ICD-10-CM | POA: Diagnosis present

## 2012-08-23 DIAGNOSIS — N184 Chronic kidney disease, stage 4 (severe): Secondary | ICD-10-CM | POA: Diagnosis present

## 2012-08-23 DIAGNOSIS — I5021 Acute systolic (congestive) heart failure: Secondary | ICD-10-CM | POA: Diagnosis present

## 2012-08-23 DIAGNOSIS — Z87891 Personal history of nicotine dependence: Secondary | ICD-10-CM

## 2012-08-23 DIAGNOSIS — I129 Hypertensive chronic kidney disease with stage 1 through stage 4 chronic kidney disease, or unspecified chronic kidney disease: Secondary | ICD-10-CM | POA: Diagnosis present

## 2012-08-23 LAB — POCT I-STAT, CHEM 8
BUN: 40 mg/dL — ABNORMAL HIGH (ref 6–23)
Chloride: 111 mEq/L (ref 96–112)
Sodium: 141 mEq/L (ref 135–145)

## 2012-08-23 LAB — PRO B NATRIURETIC PEPTIDE: Pro B Natriuretic peptide (BNP): 17103 pg/mL — ABNORMAL HIGH (ref 0–125)

## 2012-08-23 MED ORDER — SODIUM CHLORIDE 0.9 % IV SOLN
INTRAVENOUS | Status: DC
  Administered 2012-08-23: 23:00:00 via INTRAVENOUS

## 2012-08-23 MED ORDER — SODIUM CHLORIDE 0.9 % IV BOLUS (SEPSIS)
500.0000 mL | Freq: Once | INTRAVENOUS | Status: AC
  Administered 2012-08-23: 500 mL via INTRAVENOUS

## 2012-08-23 NOTE — ED Provider Notes (Signed)
History     CSN: [id number]  Arrival date & time 08/23/12  1539   First MD Initiated Contact with Patient 08/23/12 2154      Chief Complaint  Patient presents with  . Abnormal Lab  . Cough    (Consider location/radiation/quality/duration/timing/severity/associated sxs/prior treatment) HPI Comments: Timothy Faulkner is a 67 y.o. Male who is here after having an elevated creatinine, today. The patient went to the urgent care Center, today, for evaluation of persistent insomnia, and sleepiness during the day, which has been present for 2 months. One half weeks ago, his cardiologist, increased his Lasix for suspected orthopnea, due to, pulmonary edema. Patient has not noticed, an improvement. At night he cannot sleep longer than 30 minutes, and wakes up gasping for air. During the day he continually falls asleep, for brief periods. He denies chest pain, weakness, dizziness, nausea, or vomiting. There are no modifying factors.  The history is provided by the patient.    Past Medical History  Diagnosis Date  . HYPERTENSION   . HYPERCHOLESTEROLEMIA, MIXED   . CHF   . DIAB W/O COMP TYPE II/UNS NOT STATED UNCNTRL   . AUTOMATIC IMPLANTABLE CARDIAC DEFIBRILLATOR SITU   . Other primary cardiomyopathies 07/16/2011    Past Surgical History  Procedure Date  . Abcess drainage   . Cardiac defibrillator placement     [employer]    No family history on file.  History  Substance Use Topics  . Smoking status: Never Smoker   . Smokeless tobacco: Not on file  . Alcohol Use: No      Review of Systems  All other systems reviewed and are negative.    Allergies  Review of patient's allergies indicates no known allergies.  Home Medications   Current Outpatient Rx  Name  Route  Sig  Dispense  Refill  . CARVEDILOL 25 MG PO TABS   Oral   Take 25 mg by mouth 2 (two) times daily with a meal.           . COLCHICINE 0.6 MG PO TABS   Oral   Take 0.6 mg by mouth daily as needed.  For gout         . EZETIMIBE-SIMVASTATIN 10-40 MG PO TABS   Oral   Take 1 tablet by mouth at bedtime.           . FUROSEMIDE 20 MG PO TABS   Oral   Take 40 mg by mouth daily.          . ISOSORB DINITRATE-HYDRALAZINE 20-37.5 MG PO TABS   Oral   Take 1 tablet by mouth 2 (two) times daily.             BP 177/115  Pulse 89  Temp 97.5 F (36.4 C) (Oral)  Resp 16  SpO2 98%  Physical Exam  Nursing note and vitals reviewed. Constitutional: He is oriented to person, place, and time. He appears well-developed and well-nourished.  HENT:  Head: Normocephalic and atraumatic.  Right Ear: External ear normal.  Left Ear: External ear normal.  Eyes: Conjunctivae normal and EOM are normal. Pupils are equal, round, and reactive to light.  Neck: Normal range of motion and phonation normal. Neck supple.  Cardiovascular: Normal rate, regular rhythm, normal heart sounds and intact distal pulses.   Pulmonary/Chest: Effort normal and breath sounds normal. He exhibits no bony tenderness.  Abdominal: Soft. Normal appearance. There is no tenderness.  Musculoskeletal: Normal range of motion. He exhibits edema (1+,  bilateral leg).  Neurological: He is alert and oriented to person, place, and time. He has normal strength. No cranial nerve deficit or sensory deficit. He exhibits normal muscle tone. Coordination normal.  Skin: Skin is warm, dry and intact.  Psychiatric: He has a normal mood and affect. His behavior is normal. Judgment and thought content normal.    ED Course  Procedures (including critical care time)      Results for Timothy Faulkner, Timothy Faulkner (MRN [id number]) as of 08/23/2012 22:25  Ref. Range 06/21/2010 11:06  Sodium Latest Range: 135-145 mEq/L 141  Potassium Latest Range: 3.5-5.1 mEq/L 4.3  Chloride Latest Range: 96-112 mEq/L 108  CO2 Latest Range: 19-32 meq/L 26  BUN Latest Range: 6-23 mg/dL 22  Creatinine Latest Range: 0.50-1.35 mg/dL 1.7 (H)  Calcium Latest Range: 8.4-10.5  mg/dL 9.1  GFR calc non Af Amer Latest Range: >60 mL/min 51.04  Glucose Latest Range: 70-99 mg/dL 157 (H)  WBC Latest Range: 4.5-10.5 10*3/microliter 6.2  RBC Latest Range: 4.22-5.81 M/uL 3.65 (L)  Hemoglobin Latest Range: 13.0-17.0 g/dL 11.5 (L)  HCT Latest Range: 39.0-52.0 % 33.9 (L)  MCV Latest Range: 78.0-100.0 fL 92.9  MCHC Latest Range: 30.0-36.0 g/dL 34.0  RDW Latest Range: 11.5-14.6 % 12.6  Platelets Latest Range: 150.0-400.0 K/uL 216.0  Neutrophils Relative Latest Range: 43.0-77.0 % 62.8  Lymphocytes Relative Latest Range: 12.0-46.0 % 29.7  Monocytes Relative Latest Range: 3.0-12.0 % 6.2  Eosinophils Relative Latest Range: 0.0-5.0 % 0.9  Basophils Relative Latest Range: 0.0-3.0 % 0.4  NEUT# Latest Range: 1.4-7.7 K/uL 3.9  Lymphocytes Absolute Latest Range: 0.7-4.0 K/uL 1.8  Monocytes Absolute Latest Range: 0.1-1.0 K/uL 0.4  Eosinophils Absolute Latest Range: 0.0-0.7 K/uL 0.1  Basophils Absolute Latest Range: 0.0-0.1 K/uL 0.0   Results for Timothy Faulkner, Timothy Faulkner (MRN [id number]) as of 08/23/2012 22:25  Ref. Range 08/23/2012 14:24 08/23/2012 14:28  TCO2 Latest Range: 0-100 mmol/L 26   Sodium Latest Range: 135-145 mEq/L 141   Potassium Latest Range: 3.5-5.1 mEq/L 4.5   Chloride Latest Range: 96-112 mEq/L 111   BUN Latest Range: 6-23 mg/dL 40 (H)   Creatinine Latest Range: 0.50-1.35 mg/dL 3.80 (H)   Glucose Latest Range: 70-99 mg/dL 110 (H)   Calcium Ionized Latest Range: 1.13-1.30 mmol/L 1.15   Hemoglobin Latest Range: 13.0-17.0 g/dL 12.2 (L)   HCT Latest Range: 39.0-52.0 % 36.0 (L)      Date: 05/28/2012  Rate: 90  Rhythm: normal sinus rhythm  QRS Axis: normal  PR and QT Intervals: normal  ST/T Wave abnormalities: normal  PR and QRS Conduction Disutrbances:left bundle branch block  Narrative Interpretation:   Old EKG Reviewed: unchanged    Labs Reviewed  PRO B NATRIURETIC PEPTIDE - Abnormal; Notable for the following:    Pro B Natriuretic peptide (BNP) 17103.0 (*)      All other components within normal limits   Dg Chest 2 View  08/23/2012  *RADIOLOGY REPORT*  Clinical Data: Shortness of breath for 2 months, CHF  CHEST - 2 VIEW  Comparison: 06/29/2010; 04/07/2005  Findings:  Grossly unchanged enlarged cardiac silhouette and mediastinal contours.  Stable positioning of support apparatus. There is chronic thickening of the pulmonary interstitium most conspicuous within the bilateral lung bases.  There is chronic thickening along the right minor fissure.  No definite evidence of pulmonary edema. No definite pleural effusion or pneumothorax.  Unchanged bones.  IMPRESSION: Unchanged enlarged cardiac silhouette and chronic pulmonary venous congestion without definite evidence of acute edema.   Original Report Authenticated By: Jenny Reichmann  Ivy Lynn, MD      1. Renal insufficiency   2. Sleep apnea       MDM  Renal insufficiency, likely secondary to diuresis. Patient with ongoing sleep apnea symptoms. He is to be admitted for stabilization in a monitored setting.    Plan- admit to cardiology.    Richarda Blade, MD 08/24/12 [phone]

## 2012-08-23 NOTE — Telephone Encounter (Signed)
Pt is having som respiratory issues and he wants to be seen today and maybe get referred to [facility]

## 2012-08-23 NOTE — ED Notes (Addendum)
Reports trouble breathing for past couple of months, saw his MD last week, who doubled his fluid pill. Pt and family concerned about lack of progress in SOB after increase of fluid pill. Di d not call his MD this Am to report his concerns. Palpable edema ABSENT on inspection of lower extremities , no appreciable wheezes or crackles on ascultation of chest ; denies pain , has implanted defib device which has not fired recently

## 2012-08-23 NOTE — ED Provider Notes (Signed)
Medical screening examination/treatment/procedure(s) were performed by non-physician practitioner and as supervising physician I was immediately available for consultation/collaboration.  Philipp Deputy, M.D.   Harden Mo, MD 08/23/12 1600

## 2012-08-23 NOTE — ED Provider Notes (Signed)
History     CSN: [id number]  Arrival date & time 08/23/12  1132   First MD Initiated Contact with Patient 08/23/12 1253      No chief complaint on file.   (Consider location/radiation/quality/duration/timing/severity/associated sxs/prior treatment) Patient is a 67 y.o. male presenting with shortness of breath. The history is provided by the patient. No language interpreter was used.  Shortness of Breath  The current episode started today. The onset was gradual. The problem occurs continuously. The problem has been gradually worsening. The problem is severe. Nothing relieves the symptoms. Nothing aggravates the symptoms. Associated symptoms include shortness of breath. Recently, medical care has been given by the PCP.  Pt reports increasing shortness of breath at night.   Pt reports unable to rest more than 20 minutes. Pt worrried about chf and about his kidney function.   Pt's Md recently doubled his lasix because of fluid   Family reports pt unable to sleep.  Pt reports he hears a rattling in his ches  Past Medical History  Diagnosis Date  . HYPERTENSION   . HYPERCHOLESTEROLEMIA, MIXED   . CHF   . DIAB W/O COMP TYPE II/UNS NOT STATED UNCNTRL   . AUTOMATIC IMPLANTABLE CARDIAC DEFIBRILLATOR SITU   . Other primary cardiomyopathies 07/16/2011    Past Surgical History  Procedure Date  . Cardiac defibrillator placement     [employer]  . Abcess drainage     No family history on file.  History  Substance Use Topics  . Smoking status: Never Smoker   . Smokeless tobacco: Not on file  . Alcohol Use: No      Review of Systems  Respiratory: Positive for shortness of breath.   All other systems reviewed and are negative.    Allergies  Review of patient's allergies indicates no known allergies.  Home Medications   Current Outpatient Rx  Name  Route  Sig  Dispense  Refill  . CARVEDILOL 25 MG PO TABS   Oral   Take 25 mg by mouth 2 (two) times daily with a meal.           . EZETIMIBE-SIMVASTATIN 10-40 MG PO TABS   Oral   Take 1 tablet by mouth at bedtime.           . FUROSEMIDE 20 MG PO TABS   Oral   Take 20 mg by mouth daily.           . ISOSORB DINITRATE-HYDRALAZINE 20-37.5 MG PO TABS   Oral   Take 1 tablet by mouth 2 (two) times daily.           . COLCHICINE 0.6 MG PO TABS   Oral   Take 0.6 mg by mouth as needed.             BP 164/79  Pulse 82  Temp 98.2 F (36.8 C) (Oral)  Resp 20  SpO2 98%  Physical Exam  Nursing note and vitals reviewed. Constitutional: He appears well-developed and well-nourished.  HENT:  Head: Normocephalic and atraumatic.  Eyes: Conjunctivae normal are normal. Pupils are equal, round, and reactive to light.  Neck: Normal range of motion. Neck supple.  Cardiovascular: Normal rate.   Pulmonary/Chest: Effort normal and breath sounds normal.  Abdominal: Soft.  Musculoskeletal: Normal range of motion.  Neurological: He is alert.  Skin: Skin is warm.    ED Course  Procedures (including critical care time)  Labs Reviewed - No data to display No results found.   No  diagnosis found.    MDM  Pt has increased bun and creatine.   Chest xray does not show fluid,   I suspect increase in bun and creatine due to increase in diuresis,  Possible dehydration.   Pt to go to ED for hydration further evaluation       Fransico Meadow, [place] 08/23/12 1453

## 2012-08-23 NOTE — ED Notes (Signed)
Pt reports he has been SOB X 2 months and cant sleep at all because she cant breath. Pt c/o dry cough X 2 months. Pt went to urgent care today and they told him his BUN and Cre levels were high. Pt denies kidney problems, and was told that some of the medication he is on may have cause the spike in the level. Pt in nad, skin warm and dry.

## 2012-08-23 NOTE — ED Notes (Signed)
Pt c/o dry cough and SOB x2 months Also reports not sleeping well; will ave an hour of sleep per night Will wake up gasping for air Denies: chest pain, blurry vision, headaches, edema Saw PCP last week and his furosemide was increased Has a f/u w/PCP in 6 weeks Hx of CHF.  He is alert and responsive w/no signs of acute distress.

## 2012-08-23 NOTE — ED Notes (Addendum)
PT was sent here by UC for elevated creat of 3.8.  He originally went to [facility] after being forced by family after c/o sob for 1.5 months.  CXR and labs completed at [facility] today.  Pt has not taken any of his normal meds today.

## 2012-08-23 NOTE — Telephone Encounter (Signed)
Spoke with patient he has had a cough for the last several months, at night he he will wake up and feel like he can't breath and cough all night.  Saw Dr Terrence Dupont and he increased the fluid pill.  He is now taking Furosemide 40mg  daily.  Feels like he wants to see all [place] doctors and not follow with Dr Terrence Dupont.  I have asked him to call Dr Zenia Resides office as they saw him last week for this until we can work out getting him a PCP here at [facility].  He doesn't feel his symptoms are as bad during the day.  His weight is stable and he does not have any other symptoms.  I have let him know I will discuss with Dr Lovena Le and see about getting him in to see one of our PA's.  Patient aware and knows i will be calling him back

## 2012-08-23 NOTE — H&P (Signed)
Timothy Faulkner is an 67 y.o. male.   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 Last creatinine in With her was approximately 3 years ago was 1.7. Patient denies any chest pain nausea vomiting or diaphoresis. Denies any palpitation lightheadedness or syncope. Denies any ICD discharges. Denies any fever chills sore throat. Denies any urinary complaints. Patient was seen in my office few weeks ago cause of progressive shortness of breath and leg swelling and his Lasix dose was recently increased.  Past Medical History  Diagnosis Date  . HYPERTENSION   . HYPERCHOLESTEROLEMIA, MIXED   . CHF   . DIAB W/O COMP TYPE II/UNS NOT STATED UNCNTRL   . AUTOMATIC IMPLANTABLE CARDIAC DEFIBRILLATOR SITU   . Other primary cardiomyopathies 07/16/2011    Past Surgical History  Procedure Date  . Abcess drainage   . Cardiac defibrillator placement     [employer]    No family history on file. Social History:  reports that he has never smoked. He does not have any smokeless tobacco history on file. He reports that he does not drink alcohol or use illicit drugs.  Allergies: No Known Allergies   (Not in a hospital admission)  Results for orders placed during the hospital encounter of 08/23/12 (from the past 48 hour(s))  PRO B NATRIURETIC PEPTIDE     Status: Abnormal   Collection Time   08/23/12 10:22 PM      Component Value Range Comment   Pro B Natriuretic peptide (BNP)  17103.0 (*) 0 - 125 pg/mL    Dg Chest 2 View  08/23/2012  *RADIOLOGY REPORT*  Clinical Data: Shortness of breath for 2 months, CHF  CHEST - 2 VIEW  Comparison: 06/29/2010; 04/07/2005  Findings:  Grossly unchanged enlarged cardiac silhouette and mediastinal contours.  Stable positioning of support apparatus. There is chronic thickening of the pulmonary interstitium most conspicuous within the bilateral lung bases.  There is chronic thickening along the right minor fissure.  No definite evidence of pulmonary edema. No definite pleural effusion or pneumothorax.  Unchanged bones.  IMPRESSION: Unchanged enlarged cardiac silhouette and chronic pulmonary venous congestion without definite evidence of acute edema.   Original Report Authenticated By: Jake Seats, MD     Review of Systems  Constitutional: Negative for fever and chills.  Respiratory: Positive for cough and shortness of breath. Negative for hemoptysis and sputum production.   Cardiovascular: Positive for orthopnea, leg swelling and PND. Negative for chest pain, palpitations and claudication.  Gastrointestinal: Negative for nausea, vomiting and abdominal pain.  Genitourinary: Negative for dysuria.  Neurological: Positive for dizziness. Negative for headaches.    Blood pressure 177/115, pulse 89, temperature 97.5 F (36.4 C), temperature source Oral, resp. rate 16, SpO2 98.00%. Physical Exam  Constitutional: He is oriented to person, place, and time. He appears well-developed and well-nourished.  HENT:  Head: Normocephalic and atraumatic.  Eyes: Conjunctivae normal are normal. Pupils are equal, round, and reactive to light. Left eye exhibits no discharge. No scleral icterus.  Neck: Normal range of motion. Neck supple. JVD present. No tracheal deviation present.  No thyromegaly present.  Cardiovascular: Normal rate and regular rhythm.   Murmur (Soft systolic murmur noted and faint S3 gallop noted) heard. Respiratory: He has no wheezes.        Decreased breath sound at bases with faint rales  GI: Soft. Bowel sounds are normal. He exhibits no distension. There is no tenderness. There is no rebound and no guarding.  Musculoskeletal:       No clubbing cyanosis 1+ edema noted  Lymphadenopathy:    He has no cervical adenopathy.  Neurological: He is alert and oriented to person, place, and time.     Assessment/Plan Acute on chronic kidney disease stage IV Nonischemic dilated myopathy status post ICD Mild acute systolic heart failure Hypertension Non-insulin-dependent diabetes mellitus Hypercholesteremia History of gouty arthritis Stay of tobacco abuse History of EtOH abuse in past Plan As per orders Add digoxin Continue home meds Check 2-D echo and renal ultrasound Check old records from office Renal consult in a.m. Check labs in a.m.   Bethan Adamek N 08/23/2012, 11:25 PM

## 2012-08-24 ENCOUNTER — Encounter (HOSPITAL_COMMUNITY): Payer: Self-pay | Admitting: *Deleted

## 2012-08-24 ENCOUNTER — Observation Stay (HOSPITAL_COMMUNITY): Payer: Medicare Other

## 2012-08-24 LAB — COMPREHENSIVE METABOLIC PANEL
Alkaline Phosphatase: 127 U/L — ABNORMAL HIGH (ref 39–117)
BUN: 38 mg/dL — ABNORMAL HIGH (ref 6–23)
GFR calc Af Amer: 21 mL/min — ABNORMAL LOW (ref >90)
GFR calc non Af Amer: 18 mL/min — ABNORMAL LOW (ref >90)
Glucose, Bld: 144 mg/dL — ABNORMAL HIGH (ref 70–99)
Potassium: 3.6 mEq/L (ref 3.5–5.1)
Total Bilirubin: 0.5 mg/dL (ref 0.3–1.2)
Total Protein: 5.3 g/dL — ABNORMAL LOW (ref 6.0–8.3)

## 2012-08-24 LAB — CBC WITH DIFFERENTIAL/PLATELET
Eosinophils Absolute: 0.1 10*3/uL (ref 0.0–0.7)
Hemoglobin: 9.9 g/dL — ABNORMAL LOW (ref 13.0–17.0)
Lymphs Abs: 1.1 10*3/uL (ref 0.7–4.0)
MCH: 30.7 pg (ref 26.0–34.0)
MCV: 91.6 fL (ref 78.0–100.0)
Monocytes Relative: 6 % (ref 3–12)
Neutrophils Relative %: 66 % (ref 43–77)
RBC: 3.22 MIL/uL — ABNORMAL LOW (ref 4.22–5.81)

## 2012-08-24 LAB — URINALYSIS, ROUTINE W REFLEX MICROSCOPIC
Glucose, UA: NEGATIVE mg/dL
Ketones, ur: NEGATIVE mg/dL
Leukocytes, UA: NEGATIVE
Nitrite: NEGATIVE
Specific Gravity, Urine: 1.024 (ref 1.005–1.030)
pH: 5 (ref 5.0–8.0)

## 2012-08-24 LAB — TROPONIN I
Troponin I: <0.3 ng/mL (ref <0.30)
Troponin I: 0.35 ng/mL (ref <0.30)

## 2012-08-24 LAB — URINE MICROSCOPIC-ADD ON

## 2012-08-24 LAB — APTT: aPTT: 38 seconds — ABNORMAL HIGH (ref 24–37)

## 2012-08-24 LAB — CK TOTAL AND CKMB (NOT AT ARMC)
CK, MB: 5.8 ng/mL — ABNORMAL HIGH (ref 0.3–4.0)
Relative Index: 4 — ABNORMAL HIGH (ref 0.0–2.5)
Total CK: 144 U/L (ref 7–232)

## 2012-08-24 LAB — HEMOGLOBIN A1C
Hgb A1c MFr Bld: 5.4 % (ref <5.7)
Mean Plasma Glucose: 108 mg/dL (ref <117)

## 2012-08-24 LAB — GLUCOSE, CAPILLARY: Glucose-Capillary: 112 mg/dL — ABNORMAL HIGH (ref 70–99)

## 2012-08-24 LAB — MAGNESIUM: Magnesium: 2.1 mg/dL (ref 1.5–2.5)

## 2012-08-24 MED ORDER — SODIUM CHLORIDE 0.9 % IJ SOLN: 3.0000 mL | INTRAMUSCULAR | Status: DC | PRN

## 2012-08-24 MED ORDER — ONDANSETRON HCL 4 MG/2ML IJ SOLN: 4.0000 mg | Freq: Four times a day (QID) | INTRAMUSCULAR | Status: DC | PRN

## 2012-08-24 MED ORDER — DIGOXIN 125 MCG PO TABS
0.1250 mg | ORAL_TABLET | Freq: Every day | ORAL | Status: DC
  Administered 2012-08-24 – 2012-08-26 (×3): 0.125 mg via ORAL
  Filled 2012-08-24 (×3): qty 1

## 2012-08-24 MED ORDER — HEPARIN SODIUM (PORCINE) 5000 UNIT/ML IJ SOLN
5000.0000 [IU] | Freq: Three times a day (TID) | INTRAMUSCULAR | Status: DC
  Administered 2012-08-24 – 2012-08-26 (×7): 5000 [IU] via SUBCUTANEOUS
  Filled 2012-08-24 (×10): qty 1

## 2012-08-24 MED ORDER — GLIMEPIRIDE 2 MG PO TABS
2.0000 mg | ORAL_TABLET | Freq: Every day | ORAL | Status: DC
  Administered 2012-08-24 – 2012-08-25 (×2): 2 mg via ORAL
  Filled 2012-08-24 (×5): qty 1

## 2012-08-24 MED ORDER — ASPIRIN EC 81 MG PO TBEC
81.0000 mg | DELAYED_RELEASE_TABLET | Freq: Every day | ORAL | Status: DC
  Administered 2012-08-24 – 2012-08-26 (×3): 81 mg via ORAL
  Filled 2012-08-24 (×4): qty 1

## 2012-08-24 MED ORDER — FUROSEMIDE 20 MG PO TABS
20.0000 mg | ORAL_TABLET | Freq: Every day | ORAL | Status: DC
  Administered 2012-08-24: 20 mg via ORAL
  Filled 2012-08-24 (×2): qty 1

## 2012-08-24 MED ORDER — SODIUM CHLORIDE 0.9 % IV SOLN: 250.0000 mL | INTRAVENOUS | Status: DC | PRN

## 2012-08-24 MED ORDER — ISOSORB DINITRATE-HYDRALAZINE 20-37.5 MG PO TABS
1.0000 | ORAL_TABLET | Freq: Two times a day (BID) | ORAL | Status: DC
  Administered 2012-08-24 – 2012-08-26 (×5): 1 via ORAL
  Filled 2012-08-24 (×6): qty 1

## 2012-08-24 MED ORDER — ISOSORB DINITRATE-HYDRALAZINE 20-37.5 MG PO TABS: 1.0000 | ORAL_TABLET | Freq: Two times a day (BID) | ORAL | Status: DC

## 2012-08-24 MED ORDER — EZETIMIBE-SIMVASTATIN 10-40 MG PO TABS
1.0000 | ORAL_TABLET | Freq: Every day | ORAL | Status: DC
  Administered 2012-08-24 – 2012-08-25 (×2): 1 via ORAL
  Filled 2012-08-24 (×3): qty 1

## 2012-08-24 MED ORDER — CARVEDILOL 25 MG PO TABS
25.0000 mg | ORAL_TABLET | Freq: Two times a day (BID) | ORAL | Status: DC
  Administered 2012-08-24 – 2012-08-26 (×5): 25 mg via ORAL
  Filled 2012-08-24 (×7): qty 1

## 2012-08-24 MED ORDER — ACETAMINOPHEN 325 MG PO TABS: 650.0000 mg | ORAL_TABLET | ORAL | Status: DC | PRN

## 2012-08-24 MED ORDER — SODIUM CHLORIDE 0.9 % IJ SOLN
3.0000 mL | Freq: Two times a day (BID) | INTRAMUSCULAR | Status: DC
  Administered 2012-08-24 – 2012-08-26 (×5): 3 mL via INTRAVENOUS

## 2012-08-24 MED ORDER — INSULIN ASPART 100 UNIT/ML ~~LOC~~ SOLN: 0.0000 [IU] | Freq: Three times a day (TID) | SUBCUTANEOUS | Status: DC

## 2012-08-24 MED ORDER — CARVEDILOL 25 MG PO TABS: 25.0000 mg | ORAL_TABLET | Freq: Two times a day (BID) | ORAL | Status: DC

## 2012-08-24 MED ORDER — PANTOPRAZOLE SODIUM 40 MG PO TBEC
40.0000 mg | DELAYED_RELEASE_TABLET | Freq: Every day | ORAL | Status: DC
  Administered 2012-08-24 – 2012-08-26 (×3): 40 mg via ORAL
  Filled 2012-08-24 (×3): qty 1

## 2012-08-24 MED ORDER — LIVING BETTER WITH HEART FAILURE BOOK
Freq: Once | Status: AC
  Administered 2012-08-24: 16:00:00
  Filled 2012-08-24 (×2): qty 1

## 2012-08-24 NOTE — Care Management Note (Signed)
UR completed.  CRoyal RN MPH, case manager, [phone]

## 2012-08-24 NOTE — Progress Notes (Signed)
Admitted  To  [id number]   Alert , oriented x 4.  Denies discomfort,  Placed  On  tele

## 2012-08-24 NOTE — Progress Notes (Signed)
Pt given [employer] booklet and ARF handouts. Pt appears to be not interested in looking/reading information.

## 2012-08-24 NOTE — Consult Note (Signed)
I have personally seen and examined this patient and agree with the assessment/plan as outlined above by Dr.Piloto.  Timothy Faulkner is a 67 year old African American man with past medical history significant for non-ischemic/dilated cardiomyopathy status post ICD (EF about 29% per patient) and what appears to be progressive chronic kidney disease from underlying hypertension/diabetes/chronic cardiorenal syndrome. Presented yesterday as part of one month history of progressively worsening pedal edema/abdominal girth/exertional dyspnea that did not respond well to outpatient adjustment of diuretics. On admission noted to have elevated creatinine of 3.8 (apparently a month ago this was around 3.0 the patient) and today following diuretic therapy has come down to 3.3. It appears that improving cardiac function/CHF is helping with renal perfusion and improvement of his creatinine that has been seen. He is a high risk for progressive chronic kidney disease to ESRD given several comorbidities. We'll set him up for outpatient renal followup after discharge. Would recommend titration of diuretics to alleviate his pedal edema/respiratory/abdominal symptoms. Renal ultrasound reviewed and shows features consistent with chronic kidney disease-negative for obstruction. Urine protein to creatinine ratio is pending-suspicion raised for diabetic nephropathy. No acute electrolyte issues recognized, no acute dialysis needs. No recent nephrotoxins exposure including nonsteroidal anti-inflammatory drugs reported. The patient has been sensitized at length regarding the gravity of his chronic kidney disease and compliance encouraged with medications/low sodium diet-he reports that he has not been adherent to both. Plan to initiate ACE inhibitor or ARB as an outpatient after satisfactory renal recovery is noted. Timothy Vandenheuvel K.,MD 08/24/2012 3:35 PM

## 2012-08-24 NOTE — Progress Notes (Signed)
  Echocardiogram 2D Echocardiogram has been performed.  [name] 08/24/2012, 10:49 AM

## 2012-08-24 NOTE — Progress Notes (Signed)
Subjective:  Patient denies any chest pain states coughing is improved and breathing is improved  Objective:  Vital Signs in the last 24 hours: Temp:  [97.3 F (36.3 C)-98.2 F (36.8 C)] 97.3 F (36.3 C) (01/14 0925) Pulse Rate:  [69-95] 69  (01/14 0925) Resp:  [16-23] 20  (01/14 0925) BP: (120-177)/(78-115) 120/78 mmHg (01/14 0925) SpO2:  [93 %-100 %] 93 % (01/14 0925) Weight:  [88.6 kg (195 lb 5.2 oz)] 88.6 kg (195 lb 5.2 oz) (01/14 0204)  Intake/Output from previous day: 01/13 0701 - 01/14 0700 In: -  Out: 200 [Urine:200] Intake/Output from this shift: Total I/O In: 240 [P.O.:240] Out: -   Physical Exam: Neck: no adenopathy, no carotid bruit, no JVD and supple, symmetrical, trachea midline Lungs: Decreased breath sound at bases air entry improved Heart: regular rate and rhythm, S1, S2 normal and Soft systolic murmur  and faiint S3 gallop noted Abdomen: soft, non-tender; bowel sounds normal; no masses,  no organomegaly Extremities: No clubbing cyanosis trace edema noted  Lab Results:  Basename 08/23/12 1424  WBC --  HGB 12.2*  PLT --    Basename 08/23/12 1424  NA 141  K 4.5  CL 111  CO2 --  GLUCOSE 110*  BUN 40*  CREATININE 3.80*    Basename 08/24/12 0252  TROPONINI <0.30   Hepatic Function Panel No results found for this basename: PROT,ALBUMIN,AST,ALT,ALKPHOS,BILITOT,BILIDIR,IBILI in the last 72 hours No results found for this basename: CHOL in the last 72 hours No results found for this basename: PROTIME in the last 72 hours  Imaging: Imaging results have been reviewed and Dg Chest 2 View  08/23/2012  *RADIOLOGY REPORT*  Clinical Data: Shortness of breath for 2 months, CHF  CHEST - 2 VIEW  Comparison: 06/29/2010; 04/07/2005  Findings:  Grossly unchanged enlarged cardiac silhouette and mediastinal contours.  Stable positioning of support apparatus. There is chronic thickening of the pulmonary interstitium most conspicuous within the bilateral lung bases.   There is chronic thickening along the right minor fissure.  No definite evidence of pulmonary edema. No definite pleural effusion or pneumothorax.  Unchanged bones.  IMPRESSION: Unchanged enlarged cardiac silhouette and chronic pulmonary venous congestion without definite evidence of acute edema.   Original Report Authenticated By: Jake Seats, MD     Cardiac Studies:  Assessment/Plan:  Acute on chronic kidney disease stage IV  Nonischemic dilated myopathy status post ICD  Mild acute systolic heart failure  Hypertension  Non-insulin-dependent diabetes mellitus  Hypercholesteremia  History of gouty arthritis  Stay of tobacco abuse  History of EtOH abuse in past  plan Check 2-D echo and renal ultrasound Renal service consult Home soon  LOS: 1 day    Aahil Fredin N 08/24/2012, 9:59 AM

## 2012-08-24 NOTE — Care Management Note (Signed)
   CARE MANAGEMENT NOTE 08/24/2012  Patient:  Timothy Faulkner, Timothy Faulkner   Account Number:  [id number]  Date Initiated:  08/24/2012  Documentation initiated by:  Larena Ohnemus  Subjective/Objective Assessment:   consult for possible HH needs.     Action/Plan:   1/14 met with pt and list of [facility] agencies left with pt re [facility] CHF program. Pt will discuss with wife.   Anticipated DC Date:     Anticipated DC Plan:  [place]         Choice offered to / List presented to:          [facility] arranged  HH-1 RN      Status of service:  In process, will continue to follow Medicare Important Message given?   (If response is "NO", the following Medicare IM given date fields will be blank) Date Medicare IM given:   Date Additional Medicare IM given:    Discharge Disposition:  [place]  Per UR Regulation:    If discussed at Long Length of Stay Meetings, dates discussed:    Comments:

## 2012-08-24 NOTE — Consult Note (Signed)
[facility] Consult Note    Date: 08/24/2012                  Patient Name:  Timothy Faulkner  MRN: [id number]  DOB: [birth year]  Age / Sex: 67 y.o., male         PCP: Clent Demark, MD                 Service Requesting Consult: Internal medicine                  Reason for Consult: Worsening kidney function            History of Present Illness:  Patient is a 67 y.o. male with a PMHx of non ischemic dilated cardiomyopathy s/p ICD, CHF, HTN, DMT2, HLD and CKD who was admitted to [facility] on 08/23/2012 for evaluation of recent cough, orthopnea and elevated Creatinine. Pt reports was seen 2-3 months ago and informed that his CR was around 3. This was new for him. About 3 weeks ago pt returned to PCP office with dyspnea and was instructed to increase Furosemide, at that visit Cr was still elevated as pt states.    Pt denies: chest pain, palpitations, headaches, dizziness, numbness or weakness. No changes on urinary or BM habits. No unintentional weigh loss/gain.  Nephrotoxic drugs used: Pt has hx of back pain but denies use NSAIDs. No hx of contrast used on this hospitalization.  Pt follows up with Dr Terrence Dupont. Has never been evaluated by Nephrology in the past.   Medications: Outpatient medications: Prescriptions prior to admission  Medication Sig Dispense Refill  . carvedilol (COREG) 25 MG tablet Take 25 mg by mouth 2 (two) times daily with a meal.        . colchicine (COLCRYS) 0.6 MG tablet Take 0.6 mg by mouth daily as needed. For gout      . ezetimibe-simvastatin (VYTORIN) 10-40 MG per tablet Take 1 tablet by mouth at bedtime.        . furosemide (LASIX) 20 MG tablet Take 40 mg by mouth daily.       . isosorbide-hydrALAZINE (BIDIL) 20-37.5 MG per tablet Take 1 tablet by mouth 2 (two) times daily.          Current medications: Current Facility-Administered Medications  Medication Dose Route Frequency Last Dose  . 0.9 %  sodium chloride infusion  250 mL Intravenous  PRN    . acetaminophen (TYLENOL) tablet 650 mg  650 mg Oral Q4H PRN    . aspirin EC tablet 81 mg  81 mg Oral Daily 81 mg at 08/24/12 0953  . carvedilol (COREG) tablet 25 mg  25 mg Oral BID WC 25 mg at 08/24/12 0801  . digoxin (LANOXIN) tablet 0.125 mg  0.125 mg Oral Daily 0.125 mg at 08/24/12 0953  . ezetimibe-simvastatin (VYTORIN) 10-40 MG per tablet 1 tablet  1 tablet Oral QHS    . furosemide (LASIX) tablet 20 mg  20 mg Oral Daily 20 mg at 08/24/12 0953  . glimepiride (AMARYL) tablet 2 mg  2 mg Oral Q breakfast 2 mg at 08/24/12 0801  . heparin injection 5,000 Units  5,000 Units Subcutaneous Q8H 5,000 Units at 08/24/12 0751  . insulin aspart (novoLOG) injection 0-9 Units  0-9 Units Subcutaneous TID WC    . isosorbide-hydrALAZINE (BIDIL) 20-37.5 MG per tablet 1 tablet  1 tablet Oral BID 1 tablet at 08/24/12 0953  . ondansetron (ZOFRAN) injection 4 mg  4 mg Intravenous  Q6H PRN    . pantoprazole (PROTONIX) EC tablet 40 mg  40 mg Oral Q0600 40 mg at 08/24/12 0953  . sodium chloride 0.9 % injection 3 mL  3 mL Intravenous Q12H 3 mL at 08/24/12 1115  . sodium chloride 0.9 % injection 3 mL  3 mL Intravenous PRN       Allergies: No Known Allergies   Past Medical History: Past Medical History  Diagnosis Date  . HYPERTENSION   . HYPERCHOLESTEROLEMIA, MIXED   . CHF   . DIAB W/O COMP TYPE II/UNS NOT STATED UNCNTRL   . AUTOMATIC IMPLANTABLE CARDIAC DEFIBRILLATOR SITU   . Other primary cardiomyopathies 07/16/2011    Past Surgical History: Past Surgical History  Procedure Date  . Abcess drainage   . Cardiac defibrillator placement     [employer]   Family History: History reviewed. No pertinent family history.  Social History: Pt married. Lives with his wife and daughter. Former smoker( quit 20 years ago) and ETOH abuse. Use occasional recreational drugs in the past. (Denies IV drugs)  Review of Systems: As per HPI  Vital Signs: Blood pressure 120/78, pulse 69, temperature 97.3  F (36.3 C), temperature source Oral, resp. rate 20, height 5\' 8"  (1.727 m), weight 195 lb 5.2 oz (88.6 kg), SpO2 93.00%.  Weight trends: Filed Weights   08/24/12 0204  Weight: 195 lb 5.2 oz (88.6 kg)    Physical Exam: General: Vital signs reviewed and noted. Well-developed, well-nourished, in no acute distress; alert, and cooperative throughout examination.  Head: Normocephalic, atraumatic.  Eyes: PERRL, EOMI, No signs of anemia or jaundince.  Nose: Mucous membranes moist, not inflammed, nonerythematous.  Throat: Oropharynx nonerythematous, no exudate appreciated.   Neck: No deformities, masses, or tenderness noted.Supple, No carotid Bruits, no JVD.  Lungs:  Normal respiratory effort. Diminished to auscultation BL without crackles or wheezes.  Heart: RRR. S1 and S2. Systolic murmur II/VI, no gallop.   Abdomen:  BS normoactive. Soft, Nondistended, non-tender.  No masses or organomegaly.  Extremities: 2+ LE edema.  Neurologic: No focalization.   Skin: No visible rashes.    Lab results: Basic Metabolic Panel:  Lab [id number] 08/23/12 1424  NA 139 141  K 3.6 4.5  CL 106 111  CO2 21 --  GLUCOSE 144* 110*  BUN 38* 40*  CREATININE 3.34* 3.80*  CALCIUM 8.1* --  MG 2.1 --  PHOS -- --    Liver Function Tests:  Lab 08/24/12 0959  AST 23  ALT 91*  ALKPHOS 127*  BILITOT 0.5  PROT 5.3*  ALBUMIN 2.2*    CBC:  Lab 08/24/12 0959 08/23/12 1424  WBC 4.2 --  NEUTROABS 2.8 --  HGB 9.9* 12.2*  HCT 29.5* 36.0*  MCV 91.6 --  PLT 173 --    Cardiac Enzymes:  Lab 08/24/12 0939 08/24/12 0252  CKTOTAL -- --  CKMB -- --  CKMBINDEX -- --  TROPONINI <0.30 <0.30    Pro BNP: 13783.0 pg/ml  CBG:  Lab 08/24/12 1106 08/24/12 0721 08/24/12 0201  GLUCAP 103* 112* 152*    Microbiology: Results for orders placed during the hospital encounter of 06/28/10  SURGICAL PCR SCREEN     Status: Abnormal   Collection Time   06/28/10  7:08 AM      Component Value Range Status  Comment   MRSA, PCR NEGATIVE  NEGATIVE Final    Staphylococcus aureus   (*) NEGATIVE Final    Value: POSITIVE  Coagulation Studies:  Basename 08/24/12 0959  LABPROT 15.6*  INR 1.27    Urinalysis: No UA done at this admission.  Imaging: Dg Chest 2 View  08/23/2012 .  IMPRESSION: Unchanged enlarged cardiac silhouette and chronic pulmonary venous congestion without definite evidence of acute edema.   Original Report Authenticated By: Jake Seats, MD      Assessment & Plan: Pt is a 67 y.o. yo male with a PMHX of non ischemic dilated cardiomyopathy s/p ICD, CHF, HTN, DMT2, HLD and CKD , was admitted to [facility] on 08/23/2012 with clinical and laboratory signs of CHF decompensation found to have worsening of renal function.  1. AKI on CKD: Baseline Cr (?) last documented in our system is 1.7 in 2011. Current GFR is 21. No other results documented. Pt reports 2 -3 months ago and Cr was around 3. He was seen recently (last month) and Furosemide increased from 20 to 40 mg daily due to SOB and  increase LE edema. No hx of nephrotoxic drugs reported. Pt reports intermittent  back pain. No PHx or FHx of Cancer.   Cr on admission 3.80 that have improved to 3.34 today. Pt has received since admin total of 20 mg furosemide and UOP is 200 ml.   Corrected Ca 9.5 Plan:  - Will trend Cr and electrolytes.  - Will obtain more recent documentation about baseline renal function and work up done. - SPEP and UPEP - For now U/S ad UA and Urine prot- creat ratio - Renal Panel and PTH  2. Anemia: Hb 9.9 from 12.2 . No acute bleeding reported. Can be secondary to intravascular volume depletion.  - Will check iron storages and fecal occult blood.  3. Hypertension: Uncontrolled. On carvedilol, Bidil and diuretic(Lasix only 20 mg PO daily). Will discuss increase dose meds vs adding another agent.   3. Other conditions managed by primary team: Nonischemic dilated myopathy status post ICD Mild acute  systolic heart failure, Non-insulin-dependent diabetes mellitus, Hypercholesteremia, History of gouty arthritis; tobacco and  EtOH abuse in past.   D. Piloto Philippa Sicks, MD Family Medicine  PGY-2

## 2012-08-24 NOTE — Progress Notes (Signed)
[facility]  Subjective:  Feeling better. Denies SOB.  Objective: Vital signs in last 24 hours: Blood pressure 121/93, pulse 71, temperature 97.9 F (36.6 C), temperature source Oral, resp. rate 18, height 5\' 8"  (1.727 m), weight 197 lb 12 oz (89.7 kg), SpO2 100.00%.   PHYSICAL EXAM  Gen: NAD Neck: no JVD  Chest: Clear breath sounds. No rales Cardio: RRR. S1 and S2. Systolic murmur II/VI, no gallop GI: BS normoactive. Soft, Nondistended, non-tender. Extremities: 1+ LE edema bilaterally.  Pulses: 2+ and symmetric  Neurologic: No focal deficit  Lab Results:   Lab 08/24/12 0959 08/23/12 1424  NA 139 141  K 3.6 4.5  CL 106 111  CO2 21 --  BUN 38* 40*  CREATININE 3.34* 3.80*  ALB -- --  GLUCOSE 144* --  CALCIUM 8.1* --  PHOS -- --     Basename 08/24/12 0959 08/23/12 1424  WBC 4.2 --  HGB 9.9* 12.2*  HCT 29.5* 36.0*  PLT 173 --     Scheduled Meds:    . aspirin EC  81 mg Oral Daily  . carvedilol  25 mg Oral BID WC  . digoxin  0.125 mg Oral Daily  . ezetimibe-simvastatin  1 tablet Oral QHS  . furosemide  20 mg Oral Daily  . glimepiride  2 mg Oral Q breakfast  . heparin  5,000 Units Subcutaneous Q8H  . insulin aspart  0-9 Units Subcutaneous TID WC  . isosorbide-hydrALAZINE  1 tablet Oral BID  . pantoprazole  40 mg Oral Q0600  . sodium chloride  3 mL Intravenous Q12H   Continuous Infusions:  PRN Meds:.sodium chloride, acetaminophen, ondansetron (ZOFRAN) IV, sodium chloride  Assessment/Plan:  1. AKI on CKD: pt reports baseline of 3 for the past 3 months. U/S suggestive of CKD. UA with proteinuria. Prot Creat ratio pending result. Cr slowly improving with diuresis. Normal electrolytes.  - SPEP and UPEP and PTH  pending. - Dr Posey Pronto will f/u as outpatient.  2. Anemia: Hb 9.9 from 12.2 . No acute bleeding reported. Can be secondary to intravascular volume depletion.  - Negative fecal occult blood. Iron storages pending.  3. Hypertension:  Uncontrolled on admission and significantly better after initiating therapy; this raises concerns for compliance. On carvedilol, Bidil and diuretic  4. Other conditions managed by primary team: Nonischemic dilated myopathy status post ICD Mild acute systolic heart failure, Non-insulin-dependent diabetes mellitus, Hypercholesteremia, History of gouty arthritis; tobacco and EtOH abuse in past.    LOS: 1 day   D. Piloto Philippa Sicks, MD  Family Medicine PGY-2

## 2012-08-24 NOTE — Progress Notes (Signed)
Flu and PNu vaccine handout given. Pt states he will read it later. ARF overview video viewed but pt denies watching one at present. States he will watch another one later.

## 2012-08-25 LAB — GLUCOSE, CAPILLARY
Glucose-Capillary: 70 mg/dL (ref 70–99)
Glucose-Capillary: 61 mg/dL — ABNORMAL LOW (ref 70–99)
Glucose-Capillary: 132 mg/dL — ABNORMAL HIGH (ref 70–99)

## 2012-08-25 LAB — IRON AND TIBC
Saturation Ratios: 29 % (ref 20–55)
TIBC: 188 ug/dL — ABNORMAL LOW (ref 215–435)
UIBC: 133 ug/dL (ref 125–400)

## 2012-08-25 LAB — RENAL FUNCTION PANEL
BUN: 43 mg/dL — ABNORMAL HIGH (ref 6–23)
CO2: 25 mEq/L (ref 19–32)
Calcium: 8.4 mg/dL (ref 8.4–10.5)
Chloride: 107 mEq/L (ref 96–112)
Creatinine, Ser: 4.1 mg/dL — ABNORMAL HIGH (ref 0.50–1.35)
Glucose, Bld: 79 mg/dL (ref 70–99)

## 2012-08-25 LAB — CK TOTAL AND CKMB (NOT AT ARMC)
CK, MB: 5 ng/mL — ABNORMAL HIGH (ref 0.3–4.0)
Relative Index: 3.4 — ABNORMAL HIGH (ref 0.0–2.5)

## 2012-08-25 LAB — TROPONIN I: Troponin I: 0.51 ng/mL (ref <0.30)

## 2012-08-25 MED ORDER — FUROSEMIDE 10 MG/ML IJ SOLN
40.0000 mg | Freq: Two times a day (BID) | INTRAMUSCULAR | Status: AC
  Administered 2012-08-25 – 2012-08-26 (×2): 40 mg via INTRAVENOUS
  Filled 2012-08-25 (×2): qty 4

## 2012-08-25 NOTE — Progress Notes (Signed)
I have personally seen and examined this patient and agree with the assessment/plan as outlined above by Piloto MD. Plan to increase diuretics and continue strict I&Os. Renal function slightly worse. Will screen with an ANA and acute hepatitis panel given risky history. Zacharias Ridling K.,MD 08/25/2012 10:03 AM

## 2012-08-25 NOTE — Care Management Note (Addendum)
   CARE MANAGEMENT NOTE 08/25/2012  Patient:  ALLARD, TIMPERLEY   Account Number:  [id number]  Date Initiated:  08/24/2012  Documentation initiated by:  Justino Boze  Subjective/Objective Assessment:   consult for possible HH needs.     Action/Plan:   1/14 met with pt and list of [place] agencies left with pt re [facility] CHF program. Pt will discuss with wife.  08/25/2012 Pt selected [facility] for CHF program.   Anticipated DC Date:  08/27/2012   Anticipated DC Plan:  [place]         Choice offered to / List presented to:          [facility] arranged  HH-1 RN      [place].   Status of service:  Completed, signed off Medicare Important Message given?   (If response is "NO", the following Medicare IM given date fields will be blank) Date Medicare IM given:   Date Additional Medicare IM given:    Discharge Disposition:  [place]  Per UR Regulation:    If discussed at Long Length of Stay Meetings, dates discussed:    Comments:    Services will begin 1-2 days post d/c.  CRoyal RN MPH, case manager, [phone]

## 2012-08-25 NOTE — Progress Notes (Signed)
Hypoglycemic Event  CBG: 61   Treatment: 15 GM carbohydrate snack  Symptoms: None  Follow-up CBG: Time:1839 CBG Result:132  Possible Reasons for Event: Unknown  Comments/MD notified:    [place], Franky Macho  Remember to initiate Hypoglycemia Order Set & complete

## 2012-08-25 NOTE — Progress Notes (Signed)
Pharmacist Heart Failure Core Measure Documentation  Assessment: Timothy Faulkner has an EF documented as 20-25% on 08/24/12 by ECHO.  Rationale: Heart failure patients with left ventricular systolic dysfunction (LVSD) and an EF < 40% should be prescribed an angiotensin converting enzyme inhibitor (ACEI) or angiotensin receptor blocker (ARB) at discharge unless a contraindication is documented in the medical record.  This patient is not currently on an ACEI or ARB for HF.  This note is being placed in the record in order to provide documentation that a contraindication to the use of these agents is present for this encounter.  ACE Inhibitor or Angiotensin Receptor Blocker is contraindicated (specify all that apply)  []   ACEI allergy AND ARB allergy []   Angioedema []   Moderate or severe aortic stenosis []   Hyperkalemia []   Hypotension []   Renal artery stenosis [x]   Worsening renal function, preexisting renal disease or dysfunction   [name] 08/25/2012 9:54 AM

## 2012-08-25 NOTE — Progress Notes (Signed)
Subjective:  Patient denies any chest pain or shortness of breath. Noted to have minimally elevated troponin I which is attributed to heart failure and progressive renal insufficiency doubt any significant MI. Renal workup in progress  Objective:  Vital Signs in the last 24 hours: Temp:  [97 F (36.1 C)-98.5 F (36.9 C)] 97.9 F (36.6 C) (01/15 0512) Pulse Rate:  [71-72] 71  (01/15 0512) Resp:  [18] 18  (01/15 0512) BP: (103-134)/(54-93) 121/93 mmHg (01/15 0512) SpO2:  [95 %-100 %] 100 % (01/15 0512) Weight:  [89.7 kg (197 lb 12 oz)] 89.7 kg (197 lb 12 oz) (01/14 2122)  Intake/Output from previous day: 01/14 0701 - 01/15 0700 In: 958 [P.O.:958] Out: 0  Intake/Output from this shift:    Physical Exam: Neck: no adenopathy, no carotid bruit, no JVD and supple, symmetrical, trachea midline Lungs: clear to auscultation bilaterally Heart: regular rate and rhythm, S1, S2 normal and Systolic murmur noted Abdomen: soft, non-tender; bowel sounds normal; no masses,  no organomegaly Extremities: No clubbing cyanosis trace edema  Lab Results:  [facility] 08/24/12 0959 08/23/12 1424  WBC 4.2 --  HGB 9.9* 12.2*  PLT 173 --    Basename 08/25/12 0747 08/24/12 0959  NA 140 139  K 4.6 3.6  CL 107 106  CO2 25 21  GLUCOSE 79 144*  BUN 43* 38*  CREATININE 4.10* 3.34*    Basename 08/25/12 0747 08/24/12 1823  TROPONINI 0.51* 0.44*   Hepatic Function Panel  Basename 08/25/12 0747 08/24/12 0959  PROT -- 5.3*  ALBUMIN 2.6* --  AST -- 23  ALT -- 91*  ALKPHOS -- 127*  BILITOT -- 0.5  BILIDIR -- --  IBILI -- --   No results found for this basename: CHOL in the last 72 hours No results found for this basename: PROTIME in the last 72 hours  Imaging: Imaging results have been reviewed and Dg Chest 2 View  08/23/2012  *RADIOLOGY REPORT*  Clinical Data: Shortness of breath for 2 months, CHF  CHEST - 2 VIEW  Comparison: 06/29/2010; 04/07/2005  Findings:  Grossly unchanged enlarged  cardiac silhouette and mediastinal contours.  Stable positioning of support apparatus. There is chronic thickening of the pulmonary interstitium most conspicuous within the bilateral lung bases.  There is chronic thickening along the right minor fissure.  No definite evidence of pulmonary edema. No definite pleural effusion or pneumothorax.  Unchanged bones.  IMPRESSION: Unchanged enlarged cardiac silhouette and chronic pulmonary venous congestion without definite evidence of acute edema.   Original Report Authenticated By: Jake Seats, MD    US Renal  08/24/2012  *RADIOLOGY REPORT*  Clinical Data: Acute and chronic kidney disease.  RENAL/URINARY TRACT ULTRASOUND COMPLETE  Comparison:  None.  Findings:  Right Kidney:  Right kidney measures 11.5 cm in length without hydronephrosis.  The renal parenchyma is mildly echogenic without a focal lesion.  Left Kidney:  Normal appearance of left kidney without hydronephrosis.  Left kidney measures 11.3 cm in length.  Bladder:  There is fluid within the urinary bladder.  There may be mild edema within the urinary bladder wall which is nonspecific.  IMPRESSION: Negative for hydronephrosis.  Mild bladder wall thickening is nonspecific.  Findings could be related to incomplete distention.   Original Report Authenticated By: Markus Daft, M.D.     Cardiac Studies:  Assessment/Plan:  Acute on chronic kidney disease stage IV  Nonischemic dilated myopathy status post ICD    compensatedsystolic heart failure  Minimally elevated troponin I. a secondary  to above and progressive renal insufficiency doubt significant MI Hypertension  Non-insulin-dependent diabetes mellitus  Hypercholesteremia  History of gouty arthritis  Stay of tobacco abuse  History of EtOH abuse in past  plan Continue present management Will DC home if okay with renal service in a.m.  LOS: 2 days    Hannie Shoe N 08/25/2012, 1:03 PM

## 2012-08-26 LAB — HEPATITIS PANEL, ACUTE
HCV Ab: NEGATIVE
Hep B C IgM: NEGATIVE
Hepatitis B Surface Ag: NEGATIVE

## 2012-08-26 LAB — ANA: Anti Nuclear Antibody(ANA): NEGATIVE

## 2012-08-26 LAB — RENAL FUNCTION PANEL
CO2: 24 mEq/L (ref 19–32)
GFR calc Af Amer: 16 mL/min — ABNORMAL LOW (ref >90)
Glucose, Bld: 66 mg/dL — ABNORMAL LOW (ref 70–99)
Potassium: 4.2 mEq/L (ref 3.5–5.1)
Sodium: 140 mEq/L (ref 135–145)

## 2012-08-26 MED ORDER — DIGOXIN 125 MCG PO TABS: 0.1250 mg | ORAL_TABLET | Freq: Every day | ORAL | Status: DC

## 2012-08-26 MED ORDER — ASPIRIN 81 MG PO TBEC: 81.0000 mg | DELAYED_RELEASE_TABLET | Freq: Every day | ORAL | Status: DC

## 2012-08-26 NOTE — Progress Notes (Signed)
AVS reviewed with pt; teach back method used. Pt verbalized understanding of AVS and questions answered. IV and tele removed. Pt remains stable. Pt's ride here to take him home. Pt walked to elevator to exit facility. [place], Franky Macho

## 2012-08-26 NOTE — Discharge Summary (Signed)
Timothy Faulkner, Timothy Faulkner NO.:  [id number]  MEDICAL RECORD NO.:  [id number]  LOCATION:  6743                         FACILITY:  [facility]  PHYSICIAN:  Timothy Faulkner, M.D. DATE OF BIRTH:  Jul 01, 1946  DATE OF ADMISSION:  08/23/2012 DATE OF DISCHARGE:  08/26/2012                              DISCHARGE SUMMARY   ADMITTING DIAGNOSES: 1. Acute on chronic kidney disease, stage IV. 2. Nonischemic dilated cardiomyopathy status post biventricular ICD in     the past, mild acute systolic heart failure. 3. Hypertension. 4. Non-insulin-dependent diabetes mellitus. 5. Hypercholesteremia. 6. History of gouty arthritis. 7. History of tobacco abuse. 8. History of alcohol abuse in the past.  DISCHARGE DIAGNOSES: 1. Acute on chronic kidney disease, stage IV, stable. 2. 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 3. Compensated systolic heart failure. 4. Hypertension. 5. Non-insulin-dependent diabetes mellitus. 6. Hypercholesteremia. 7. History of gouty arthritis. 8. History of tobacco abuse. 9. History of alcohol abuse in the past.  DISCHARGE HOME MEDICATIONS: 1. Enteric-coated aspirin 81 mg 1 tablet daily. 2. Digoxin 0.125 mg 1 tablet daily. 3. Carvedilol 25 mg 1 tablet twice daily. 4. Vytorin 10/40 one tablet daily. 5. Lasix 40 mg daily. 6. Glipizide 5 mg daily. 7. BiDil 1 tablet twice daily.  The patient has been advised to stop colchicine.  DIET:  Low salt, low cholesterol, 1800 calories ADA diet/renal diet.  FOLLOWUP:  Follow up with me in 1 week.  Follow up with Dr. Posey Faulkner, [facility] in 2 weeks.  CONDITION AT DISCHARGE:  Stable.  BRIEF HISTORY AND HOSPITAL COURSE:  [name] is a 67 year old male with past medical history significant for nonischemic dilated cardiomyopathy.  EF of 20% to 35% status post ICD in the past, history of recurrent congestive heart failure secondary to systolic  dysfunction, hypertension, non-insulin-dependent diabetes mellitus, hypercholesteremia, chronic kidney disease stage IV, history of tobacco abuse, alcohol abuse in the past.  He went to urgent care center because of persistent coughing associated with PND, orthopnea, and daytime sleepiness, and was noted to have elevated creatinine of 3.8, last creatinine was approximately 3 years ago, which was 1.7.  The patient denies any chest pain, nausea, vomiting, diaphoresis.  Denies palpitation, lightheadedness, or syncope.  Denies any ICD discharges. Denies any fever, chills, sore throat.  Denies urinary complaints.  The patient was seen in my office few weeks ago because of progressive shortness of breath and leg swelling and his Lasix dose was recently increased.  PHYSICAL EXAMINATION:  VITAL SIGNS:  His blood pressure was 177/115, pulse was 89, he was afebrile. HEENT:  Conjunctivae was pink. NECK:  Supple.  Positive JVD. LUNGS:  Decreased breath sounds at bases with faint rales. CARDIOVASCULAR:  S1 and S2 was normal.  There was soft systolic murmur and S3 gallop. ABDOMEN:  Soft.  Bowel sounds were present.  Nontender. EXTREMITIES:  There was no clubbing, cyanosis.  There was 1+ edema.  LABORATORY DATA:  Sodium was 139, potassium 3.6, bicarb was 21, BUN 42, creatinine 3.80.  His BNP was 17,103.  Hemoglobin was 12.2, hematocrit 36.  Repeat BNP was 13,783.  Two sets of troponin-I were negative. Third set  was slightly elevated at 0.3, 0.44, and then 0.51.  His CK total was 144, MB 5.8.  Repeat CK total 145, MB 5.0 which is trending down.  His last sodium 140, potassium 4.2, bicarb is 24, BUN 43, creatinine 4.08, which is stable for last 2 days.  His last hemoglobin was 9.9, hematocrit 29.5, white count of 4.2.  Stool occult blood were negative.  Renal ultrasound showed negative for hydronephrosis.  Mild bladder wall thickening is nonspecific.  EKG showed normal sinus rhythm, left axis  deviation, incomplete left bundle-branch block, and nonspecific ST-T wave changes.  BRIEF HOSPITAL COURSE:  The patient was admitted to telemetry unit. Nephrology consultation was obtained.  The patient was started on IV Lasix and digoxin was added with good diuresis and improvement in his breathing and leg swelling.  The patient did not have any episodes of chest pain or problems breathing during the hospital stay.  The patient is ambulating in the room without any problems.  The patient will be discharged home on above medications and will be followed up by me in 1 week and Renal Service in 2 weeks.     Timothy Faulkner. Terrence Faulkner, M.D.     MNH/MEDQ  D:  08/26/2012  T:  08/26/2012  Job:  [id number]

## 2012-08-26 NOTE — Progress Notes (Signed)
[facility]  Subjective:  Feeling better, reports voiding.   Objective: Vital signs in last 24 hours: Blood pressure 118/70, pulse 73, temperature 97.3 F (36.3 C), temperature source Oral, resp. rate 18, height 5\' 8"  (1.727 m), weight 199 lb (90.266 kg), SpO2 97.00%.   PHYSICAL EXAM  Gen: NAD Neck: no JVD  Chest: Clear breath sounds. No rales Cardio: RRR. S1 and S2. No murmur GI: BS normoactive. Soft, Nondistended, non-tender. Extremities: trace LE edema bilaterally.  Pulses: 2+ and symmetric  Neurologic: No focal deficit  Lab Results:   Lab 08/26/12 0550 08/25/12 0747 08/24/12 0959  NA 140 140 139  K 4.2 4.6 3.6  CL 107 107 106  CO2 24 25 21   BUN 43* 43* 38*  CREATININE 4.08* 4.10* 3.34*  ALB -- -- --  GLUCOSE 66* -- --  CALCIUM 8.1* 8.4 8.1*  PHOS 3.0 2.8 --     Basename 08/24/12 0959 08/23/12 1424  WBC 4.2 --  HGB 9.9* 12.2*  HCT 29.5* 36.0*  PLT 173 --     Scheduled Meds:    . aspirin EC  81 mg Oral Daily  . carvedilol  25 mg Oral BID WC  . digoxin  0.125 mg Oral Daily  . ezetimibe-simvastatin  1 tablet Oral QHS  . furosemide  40 mg Intravenous BID  . glimepiride  2 mg Oral Q breakfast  . heparin  5,000 Units Subcutaneous Q8H  . insulin aspart  0-9 Units Subcutaneous TID WC  . isosorbide-hydrALAZINE  1 tablet Oral BID  . pantoprazole  40 mg Oral Q0600  . sodium chloride  3 mL Intravenous Q12H   Continuous Infusions:  PRN Meds:.sodium chloride, acetaminophen, ondansetron (ZOFRAN) IV, sodium chloride  Assessment/Plan:  1. AKI on CKD: pt reports baseline of 3 for the past 3 months. U/S suggestive of CKD. UA with proteinuria. Prot Creat ratio pending result. Lasix IV started yesterday.  Cr elevated at 4.08.Normal electrolytes.  - SPEP and UPEP pending.  - PTH elevated at 380.3.  - Dr Posey Pronto will f/u as outpatient.  2. Anemia: Hb 9.9 from 12.2 . No acute bleeding reported. Can be secondary to intravascular volume depletion or  chronic kidney disease - Negative fecal occult blood. TIBC low with normal iron.   3. Hypertension: Uncontrolled on admission. Now controled On carvedilol, Bidil and diuretic  4. Other conditions managed by primary team: Nonischemic dilated myopathy status post ICD Mild acute systolic heart failure, Non-insulin-dependent diabetes mellitus, Hypercholesteremia, History of gouty arthritis; tobacco and EtOH abuse in past.    LOS: 3 days   Liam Graham, PGY-2 Family Medicine Resident

## 2012-08-26 NOTE — Telephone Encounter (Signed)
Patient went to the hospital.

## 2012-08-26 NOTE — Progress Notes (Signed)
I have personally seen and examined this patient and agree with the assessment/plan as outlined above by Losq MD (PGY2). Plans for DC today on PO lasix with OP f/u with me on 1/27. Precious Segall K.,MD 08/26/2012 10:09 AM

## 2012-08-26 NOTE — Progress Notes (Signed)
Hypoglycemic Event  CBG: 66  Treatment: 15 GM carbohydrate snack  Symptoms: None  Follow-up CBG: [id number] CBG Result:121  Possible Reasons for Event: Unknown  Comments/MD notified:Dr Harwani; Hold Amaryl 2 mg this am    [place], Franky Macho  Remember to initiate Hypoglycemia Order Set & complete

## 2012-08-26 NOTE — Discharge Summary (Signed)
  Discharge summary dictated on 116 1 for dictation number is [phone]

## 2012-08-27 LAB — PROTEIN ELECTROPHORESIS, SERUM
Albumin ELP: 49.6 % — ABNORMAL LOW (ref 55.8–66.1)
Beta 2: 4.6 % (ref 3.2–6.5)

## 2012-10-28 ENCOUNTER — Other Ambulatory Visit: Payer: Self-pay | Admitting: Internal Medicine

## 2012-10-28 ENCOUNTER — Ambulatory Visit (INDEPENDENT_AMBULATORY_CARE_PROVIDER_SITE_OTHER): Payer: Medicare Other | Admitting: *Deleted

## 2012-10-28 DIAGNOSIS — I428 Other cardiomyopathies: Secondary | ICD-10-CM

## 2012-10-28 DIAGNOSIS — Z95810 Presence of automatic (implantable) cardiac defibrillator: Secondary | ICD-10-CM

## 2012-11-01 LAB — REMOTE ICD DEVICE
BRDY-0002RV: 40 {beats}/min
CHARGE TIME: 8.7 s
DEV-0020ICD: NEGATIVE
RV LEAD AMPLITUDE: 22.7 mv
TOT-0006: 20131211000000

## 2012-11-05 ENCOUNTER — Encounter: Payer: Self-pay | Admitting: *Deleted

## 2012-11-12 ENCOUNTER — Other Ambulatory Visit: Payer: Self-pay

## 2012-11-12 DIAGNOSIS — N186 End stage renal disease: Secondary | ICD-10-CM

## 2012-11-16 ENCOUNTER — Other Ambulatory Visit (HOSPITAL_COMMUNITY): Payer: Self-pay | Admitting: *Deleted

## 2012-11-17 ENCOUNTER — Encounter (HOSPITAL_COMMUNITY)
Admission: RE | Admit: 2012-11-17 | Discharge: 2012-11-17 | Disposition: A | Discharge status: Home / Self Care | Payer: Medicare Other | Source: Ambulatory Visit | Attending: Nephrology | Admitting: Nephrology

## 2012-11-17 DIAGNOSIS — D631 Anemia in chronic kidney disease: Secondary | ICD-10-CM | POA: Insufficient documentation

## 2012-11-17 DIAGNOSIS — N186 End stage renal disease: Secondary | ICD-10-CM | POA: Insufficient documentation

## 2012-11-17 DIAGNOSIS — N039 Chronic nephritic syndrome with unspecified morphologic changes: Secondary | ICD-10-CM | POA: Insufficient documentation

## 2012-11-17 LAB — POCT HEMOGLOBIN-HEMACUE: Hemoglobin: 10 g/dL — ABNORMAL LOW (ref 13.0–17.0)

## 2012-11-17 MED ORDER — EPOETIN ALFA 10000 UNIT/ML IJ SOLN
INTRAMUSCULAR | Status: AC
  Administered 2012-11-17: 10000 [IU] via SUBCUTANEOUS
  Filled 2012-11-17: qty 1

## 2012-11-24 ENCOUNTER — Encounter: Payer: Self-pay | Admitting: Vascular Surgery

## 2012-11-24 ENCOUNTER — Encounter (HOSPITAL_COMMUNITY): Payer: Medicare Other

## 2012-11-25 ENCOUNTER — Encounter (INDEPENDENT_AMBULATORY_CARE_PROVIDER_SITE_OTHER): Payer: Medicare Other | Admitting: Vascular Surgery

## 2012-11-25 ENCOUNTER — Encounter: Payer: Self-pay | Admitting: Vascular Surgery

## 2012-11-25 ENCOUNTER — Other Ambulatory Visit: Payer: Self-pay

## 2012-11-25 ENCOUNTER — Encounter (HOSPITAL_COMMUNITY): Payer: Self-pay | Admitting: Pharmacy Technician

## 2012-11-25 ENCOUNTER — Ambulatory Visit (INDEPENDENT_AMBULATORY_CARE_PROVIDER_SITE_OTHER): Payer: Medicare Other | Admitting: Vascular Surgery

## 2012-11-25 VITALS — BP 153/71 | HR 62 | Ht 68.0 in | Wt 196.9 lb

## 2012-11-25 DIAGNOSIS — N184 Chronic kidney disease, stage 4 (severe): Secondary | ICD-10-CM

## 2012-11-25 DIAGNOSIS — Z01810 Encounter for preprocedural cardiovascular examination: Secondary | ICD-10-CM

## 2012-11-25 DIAGNOSIS — N186 End stage renal disease: Secondary | ICD-10-CM

## 2012-11-25 NOTE — Progress Notes (Signed)
[facility] [place] HISTORY AND PHYSICAL   History of Present Illness:  Patient is a 67 y.o. year old male who presents for placement of a permanent hemodialysis access. The patient is right handed .  The patient is not currently on hemodialysis.  The cause of renal failure is thought to be secondary to diabetes and hypertension.  He does have a left-sided AICD.  Other chronic medical problems include cardiomyopathy, congestive heart failure elevated cholesterol . These are currently controlled. He is on aspirin and statin.  Past Medical History  Diagnosis Date  . HYPERTENSION   . HYPERCHOLESTEROLEMIA, MIXED   . CHF   . DIAB W/O COMP TYPE II/UNS NOT STATED UNCNTRL   . AUTOMATIC IMPLANTABLE CARDIAC DEFIBRILLATOR SITU   . 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 Anemia     Past Surgical History  Procedure Laterality Date  . Abcess drainage    . Cardiac defibrillator placement      [employer]     Social History History  Substance Use Topics  . Smoking status: Former Smoker    Types: Cigarettes    Quit date: 11/25/1992  . Smokeless tobacco: Never Used  . Alcohol Use: No    Family History Family History  Problem Relation Age of Onset  . Heart disease Father     Allergies  No Known Allergies   Current Outpatient Prescriptions  Medication Sig Dispense Refill  . aspirin EC 81 MG EC tablet Take 1 tablet (81 mg total) by mouth daily.  30 tablet  3  . calcitRIOL (ROCALTROL) 0.25 MCG capsule Take 0.25 mcg by mouth 3 (three) times a week.      . carvedilol (COREG) 25 MG tablet Take 25 mg by mouth 2 (two) times daily with a meal.        . digoxin (LANOXIN) 0.125 MG tablet Take 1 tablet (0.125 mg total) by mouth daily.  30 tablet  3  . ezetimibe-simvastatin (VYTORIN) 10-40 MG per tablet Take 1 tablet by mouth at bedtime.        . furosemide (LASIX) 20 MG tablet Take 40 mg by mouth daily.       Marland Kitchen glipiZIDE (GLUCOTROL XL) 5 MG 24 hr tablet Take 5  mg by mouth 2 (two) times daily.      . isosorbide-hydrALAZINE (BIDIL) 20-37.5 MG per tablet Take 1 tablet by mouth 2 (two) times daily.         No current facility-administered medications for this visit.    ROS:   General:  No weight loss, Fever, chills  HEENT: No recent headaches, no nasal bleeding, no visual changes, no sore throat  Neurologic: No dizziness, blackouts, seizures. No recent symptoms of stroke or mini- stroke. No recent episodes of slurred speech, or temporary blindness.  Cardiac: No recent episodes of chest pain/pressure, no shortness of breath at rest.  No shortness of breath with exertion.  Denies history of atrial fibrillation or irregular heartbeat  Vascular: No history of rest pain in feet.  No history of claudication.  No history of non-healing ulcer, No history of DVT   Pulmonary: No home oxygen, no productive cough, no hemoptysis,  No asthma or wheezing  Musculoskeletal:  [ ]  Arthritis, [ ]  Low back pain,  [ ]  Joint pain  Hematologic:No history of hypercoagulable state.  No history of easy bleeding.  No history of anemia  Gastrointestinal: No hematochezia or melena,  No gastroesophageal reflux, no trouble swallowing  Urinary: [ ]  chronic  Kidney disease, [ ]  on HD - [ ]  MWF or [ ]  TTHS, [ ]  Burning with urination, [ ]  Frequent urination, [ ]  Difficulty urinating;   Skin: No rashes  Psychological: No history of anxiety,  No history of depression   Physical Examination  Filed Vitals:   11/25/12 1341  BP: 153/71  Pulse: 62  Height: 5\' 8"  (1.727 m)  Weight: 196 lb 14.4 oz (89.313 kg)  SpO2: 100%    Body mass index is 29.95 kg/(m^2).  General:  Alert and oriented, no acute distress HEENT: Normal Neck: No bruit or JVD Pulmonary: Clear to auscultation bilaterally Cardiac: Regular Rate and Rhythm without murmur Skin: No rash Extremity Pulses:  2+ radial, brachial pulses bilaterally Musculoskeletal: No deformity or edema  Neurologic: Upper and  lower extremity motor 5/5 and symmetric  DATA: He had a vein mapping ultrasound today which shows the cephalic vein is fairly small bilaterally in the forearm. It is of good quality in the upper arm bilaterally. The basilic vein also is of good quality bilaterally in the upper arm. I reviewed and interpreted this study. Vein diameter is 4-6 mm bilaterally.   ASSESSMENT: Needs hemodialysis access.  Since he has defibrillator in the left side we will avoid his left arm and start the right arm access even though he is right-hand dominant.  I believe the risk of venous hypertension in the left side would be significant.   PLAN:  Right brachiocephalic AV fistula Monday, April 21. Risks benefits possible complications and procedure details were explained the patient today including but not limited to bleeding infection non-maturation of fistula ischemic steal  Ruta Hinds, MD [facility] [place]: [phone] Pager: [phone]

## 2012-11-26 ENCOUNTER — Encounter (HOSPITAL_COMMUNITY): Payer: Self-pay | Admitting: *Deleted

## 2012-11-26 NOTE — Progress Notes (Signed)
[employer] rep notified of pt. Surgery time.

## 2012-11-26 NOTE — Progress Notes (Signed)
Peri-operative implanted cardiac device orders faxed to [facility].

## 2012-11-29 ENCOUNTER — Encounter: Payer: Self-pay | Admitting: Internal Medicine

## 2012-11-29 ENCOUNTER — Ambulatory Visit (HOSPITAL_COMMUNITY): Admission: RE | Admit: 2012-11-29 | Payer: Medicare Other | Source: Ambulatory Visit | Admitting: Vascular Surgery

## 2012-11-29 SURGERY — ARTERIOVENOUS (AV) FISTULA CREATION
Anesthesia: Monitor Anesthesia Care | Site: Arm Upper | Laterality: Right

## 2012-12-01 ENCOUNTER — Encounter (HOSPITAL_COMMUNITY)
Admission: RE | Admit: 2012-12-01 | Discharge: 2012-12-01 | Disposition: A | Discharge status: Home / Self Care | Payer: Medicare Other | Source: Ambulatory Visit | Attending: Nephrology | Admitting: Nephrology

## 2012-12-01 ENCOUNTER — Encounter (HOSPITAL_COMMUNITY): Payer: Medicare Other

## 2012-12-01 LAB — POCT HEMOGLOBIN-HEMACUE: Hemoglobin: 8.7 g/dL — ABNORMAL LOW (ref 13.0–17.0)

## 2012-12-01 MED ORDER — EPOETIN ALFA 10000 UNIT/ML IJ SOLN
10000.0000 [IU] | INTRAMUSCULAR | Status: DC
  Administered 2012-12-01: 10000 [IU] via SUBCUTANEOUS

## 2012-12-06 ENCOUNTER — Encounter (HOSPITAL_COMMUNITY): Payer: Self-pay | Admitting: Pharmacy Technician

## 2012-12-07 ENCOUNTER — Other Ambulatory Visit: Payer: Self-pay

## 2012-12-07 ENCOUNTER — Encounter (HOSPITAL_COMMUNITY)
Admission: RE | Admit: 2012-12-07 | Discharge: 2012-12-07 | Disposition: A | Discharge status: Home / Self Care | Payer: Medicare Other | Source: Ambulatory Visit | Attending: Nephrology | Admitting: Nephrology

## 2012-12-07 LAB — RENAL FUNCTION PANEL
Albumin: 3.2 g/dL — ABNORMAL LOW (ref 3.5–5.2)
Calcium: 8.9 mg/dL (ref 8.4–10.5)
GFR calc Af Amer: 11 mL/min — ABNORMAL LOW (ref >90)
Glucose, Bld: 121 mg/dL — ABNORMAL HIGH (ref 70–99)
Phosphorus: 3.7 mg/dL (ref 2.3–4.6)
Potassium: 4.1 mEq/L (ref 3.5–5.1)
Sodium: 140 mEq/L (ref 135–145)

## 2012-12-07 LAB — MAGNESIUM: Magnesium: 2 mg/dL (ref 1.5–2.5)

## 2012-12-07 MED ORDER — EPOETIN ALFA 10000 UNIT/ML IJ SOLN
10000.0000 [IU] | INTRAMUSCULAR | Status: DC
  Administered 2012-12-07: 10000 [IU] via SUBCUTANEOUS

## 2012-12-07 MED ORDER — EPOETIN ALFA 10000 UNIT/ML IJ SOLN
INTRAMUSCULAR | Status: AC
  Filled 2012-12-07: qty 1

## 2012-12-10 NOTE — Progress Notes (Addendum)
Final SDW call made at [facility] after a previous attempt. Pre-op instructions were left on pt voice mail

## 2012-12-12 MED ORDER — DEXTROSE 5 % IV SOLN
1.5000 g | INTRAVENOUS | Status: DC
  Filled 2012-12-12: qty 1.5

## 2012-12-13 ENCOUNTER — Ambulatory Visit (HOSPITAL_COMMUNITY)
Admission: RE | Admit: 2012-12-13 | Discharge: 2012-12-13 | Disposition: A | Discharge status: Home / Self Care | Payer: Medicare Other | Source: Ambulatory Visit | Attending: Vascular Surgery | Admitting: Vascular Surgery

## 2012-12-13 ENCOUNTER — Telehealth: Payer: Self-pay | Admitting: Vascular Surgery

## 2012-12-13 ENCOUNTER — Encounter (HOSPITAL_COMMUNITY)
Admission: RE | Disposition: A | Discharge status: Home / Self Care | Payer: Self-pay | Source: Ambulatory Visit | Attending: Vascular Surgery

## 2012-12-13 ENCOUNTER — Ambulatory Visit (HOSPITAL_COMMUNITY): Payer: Medicare Other | Admitting: Certified Registered"

## 2012-12-13 ENCOUNTER — Encounter (HOSPITAL_COMMUNITY): Payer: Self-pay | Admitting: *Deleted

## 2012-12-13 ENCOUNTER — Encounter (HOSPITAL_COMMUNITY): Payer: Self-pay | Admitting: Certified Registered"

## 2012-12-13 ENCOUNTER — Ambulatory Visit (HOSPITAL_COMMUNITY)
Admission: RE | Admit: 2012-12-13 | Discharge: 2012-12-13 | Disposition: A | Discharge status: Home / Self Care | Payer: Medicare Other | Source: Ambulatory Visit | Attending: Nephrology | Admitting: Nephrology

## 2012-12-13 DIAGNOSIS — E7800 Pure hypercholesterolemia, unspecified: Secondary | ICD-10-CM | POA: Insufficient documentation

## 2012-12-13 DIAGNOSIS — N186 End stage renal disease: Secondary | ICD-10-CM

## 2012-12-13 DIAGNOSIS — Z87891 Personal history of nicotine dependence: Secondary | ICD-10-CM | POA: Insufficient documentation

## 2012-12-13 DIAGNOSIS — I509 Heart failure, unspecified: Secondary | ICD-10-CM | POA: Insufficient documentation

## 2012-12-13 DIAGNOSIS — E119 Type 2 diabetes mellitus without complications: Secondary | ICD-10-CM | POA: Insufficient documentation

## 2012-12-13 DIAGNOSIS — I428 Other cardiomyopathies: Secondary | ICD-10-CM | POA: Insufficient documentation

## 2012-12-13 DIAGNOSIS — I12 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 chronic kidney disease or end stage renal disease: Secondary | ICD-10-CM | POA: Insufficient documentation

## 2012-12-13 HISTORY — PX: AV FISTULA PLACEMENT: SHX1204

## 2012-12-13 LAB — GLUCOSE, CAPILLARY: Glucose-Capillary: 51 mg/dL — ABNORMAL LOW (ref 70–99)

## 2012-12-13 LAB — SURGICAL PCR SCREEN: Staphylococcus aureus: NEGATIVE

## 2012-12-13 LAB — POCT I-STAT 4, (NA,K, GLUC, HGB,HCT): Sodium: 143 mEq/L (ref 135–145)

## 2012-12-13 SURGERY — ARTERIOVENOUS (AV) FISTULA CREATION
Anesthesia: Monitor Anesthesia Care | Site: Arm Upper | Laterality: Right | Wound class: Clean

## 2012-12-13 MED ORDER — FENTANYL CITRATE 0.05 MG/ML IJ SOLN
INTRAMUSCULAR | Status: DC | PRN
  Administered 2012-12-13 (×2): 50 ug via INTRAVENOUS

## 2012-12-13 MED ORDER — EPOETIN ALFA 10000 UNIT/ML IJ SOLN
INTRAMUSCULAR | Status: AC
  Administered 2012-12-13: 10000 [IU]
  Filled 2012-12-13: qty 1

## 2012-12-13 MED ORDER — DEXTROSE 50 % IV SOLN
6.2500 g | Freq: Once | INTRAVENOUS | Status: AC
  Administered 2012-12-13: 6.25 g via INTRAVENOUS

## 2012-12-13 MED ORDER — 0.9 % SODIUM CHLORIDE (POUR BTL) OPTIME
TOPICAL | Status: DC | PRN
  Administered 2012-12-13: 1000 mL

## 2012-12-13 MED ORDER — DEXTROSE 50 % IV SOLN
INTRAVENOUS | Status: AC
  Filled 2012-12-13: qty 50

## 2012-12-13 MED ORDER — HYDROMORPHONE HCL PF 1 MG/ML IJ SOLN: 0.2500 mg | INTRAMUSCULAR | Status: DC | PRN

## 2012-12-13 MED ORDER — LIDOCAINE-EPINEPHRINE 0.5 %-1:200000 IJ SOLN
INTRAMUSCULAR | Status: DC | PRN
  Administered 2012-12-13: 50 mL

## 2012-12-13 MED ORDER — OXYCODONE HCL 5 MG PO TABS: 5.0000 mg | ORAL_TABLET | ORAL | Status: DC | PRN

## 2012-12-13 MED ORDER — LIDOCAINE-EPINEPHRINE 0.5 %-1:200000 IJ SOLN
INTRAMUSCULAR | Status: AC
  Filled 2012-12-13: qty 1

## 2012-12-13 MED ORDER — PROPOFOL INFUSION 10 MG/ML OPTIME
INTRAVENOUS | Status: DC | PRN
  Administered 2012-12-13: 75 ug/kg/min via INTRAVENOUS

## 2012-12-13 MED ORDER — MUPIROCIN 2 % EX OINT: TOPICAL_OINTMENT | Freq: Two times a day (BID) | CUTANEOUS | Status: DC

## 2012-12-13 MED ORDER — LIDOCAINE-EPINEPHRINE (PF) 1 %-1:200000 IJ SOLN
INTRAMUSCULAR | Status: AC
  Filled 2012-12-13: qty 10

## 2012-12-13 MED ORDER — ONDANSETRON HCL 4 MG/2ML IJ SOLN: 4.0000 mg | Freq: Once | INTRAMUSCULAR | Status: DC | PRN

## 2012-12-13 MED ORDER — HEPARIN SODIUM (PORCINE) 5000 UNIT/ML IJ SOLN
INTRAMUSCULAR | Status: DC | PRN
  Administered 2012-12-13: 12:00:00

## 2012-12-13 MED ORDER — DEXTROSE 50 % IV SOLN
INTRAVENOUS | Status: DC | PRN
  Administered 2012-12-13: 25 g via INTRAVENOUS

## 2012-12-13 MED ORDER — SODIUM CHLORIDE 0.9 % IV SOLN
INTRAVENOUS | Status: DC
  Administered 2012-12-13: 10:00:00 via INTRAVENOUS

## 2012-12-13 MED ORDER — MUPIROCIN 2 % EX OINT
TOPICAL_OINTMENT | CUTANEOUS | Status: AC
  Administered 2012-12-13: 1 via NASAL
  Filled 2012-12-13: qty 22

## 2012-12-13 MED ORDER — MIDAZOLAM HCL 5 MG/5ML IJ SOLN
INTRAMUSCULAR | Status: DC | PRN
  Administered 2012-12-13: 2 mg via INTRAVENOUS

## 2012-12-13 MED ORDER — EPOETIN ALFA 10000 UNIT/ML IJ SOLN: 10000.0000 [IU] | INTRAMUSCULAR | Status: DC

## 2012-12-13 MED ORDER — DEXTROSE 50 % IV SOLN
INTRAVENOUS | Status: DC
Start: 2012-12-13 — End: 2012-12-14
  Filled 2012-12-13: qty 50

## 2012-12-13 SURGICAL SUPPLY — 51 items
ADH SKN CLS APL DERMABOND .7 (GAUZE/BANDAGES/DRESSINGS)
APL SKNCLS STERI-STRIP NONHPOA (GAUZE/BANDAGES/DRESSINGS) ×1
BENZOIN TINCTURE PRP APPL 2/3 (GAUZE/BANDAGES/DRESSINGS) ×1 IMPLANT
CANISTER SUCTION 2500CC (MISCELLANEOUS) ×2 IMPLANT
CLIP TI MEDIUM 6 (CLIP) ×1 IMPLANT
CLIP TI WIDE RED SMALL 6 (CLIP) ×1 IMPLANT
CLOTH BEACON ORANGE TIMEOUT ST (SAFETY) ×2 IMPLANT
COVER PROBE W GEL 5X96 (DRAPES) ×2 IMPLANT
COVER SURGICAL LIGHT HANDLE (MISCELLANEOUS) ×2 IMPLANT
DECANTER SPIKE VIAL GLASS SM (MISCELLANEOUS) ×2 IMPLANT
DERMABOND ADVANCED (GAUZE/BANDAGES/DRESSINGS)
DERMABOND ADVANCED .7 DNX12 (GAUZE/BANDAGES/DRESSINGS) ×1 IMPLANT
DRAIN PENROSE 1/4X12 LTX STRL (WOUND CARE) ×1 IMPLANT
ELECT REM PT RETURN 9FT ADLT (ELECTROSURGICAL) ×2
ELECTRODE REM PT RTRN 9FT ADLT (ELECTROSURGICAL) ×1 IMPLANT
GEL ULTRASOUND 20GR AQUASONIC (MISCELLANEOUS) IMPLANT
GLOVE BIO SURGEON STRL SZ7 (GLOVE) ×1 IMPLANT
GLOVE BIO SURGEON STRL SZ7.5 (GLOVE) ×1 IMPLANT
GLOVE BIOGEL PI IND STRL 6.5 (GLOVE) IMPLANT
GLOVE BIOGEL PI IND STRL 7.0 (GLOVE) IMPLANT
GLOVE BIOGEL PI INDICATOR 6.5 (GLOVE) ×1
GLOVE BIOGEL PI INDICATOR 7.0 (GLOVE) ×2
GLOVE ECLIPSE 6.5 STRL STRAW (GLOVE) ×1 IMPLANT
GLOVE SS BIOGEL STRL SZ 6.5 (GLOVE) IMPLANT
GLOVE SS BIOGEL STRL SZ 7.5 (GLOVE) IMPLANT
GLOVE SUPERSENSE BIOGEL SZ 6.5 (GLOVE) ×1
GLOVE SUPERSENSE BIOGEL SZ 7.5 (GLOVE) ×1
GOWN PREVENTION PLUS XLARGE (GOWN DISPOSABLE) ×1 IMPLANT
GOWN STRL NON-REIN LRG LVL3 (GOWN DISPOSABLE) ×4 IMPLANT
GOWN STRL REIN XL XLG (GOWN DISPOSABLE) ×2 IMPLANT
KIT BASIN OR (CUSTOM PROCEDURE TRAY) ×2 IMPLANT
KIT ROOM TURNOVER OR (KITS) ×2 IMPLANT
LIGACLIP MED TITANIUM (CLIP) ×1 IMPLANT
LIGACLIP SM TITANIUM (CLIP) ×1 IMPLANT
LOOP VESSEL MINI RED (MISCELLANEOUS) IMPLANT
NS IRRIG 1000ML POUR BTL (IV SOLUTION) ×2 IMPLANT
PACK CV ACCESS (CUSTOM PROCEDURE TRAY) ×2 IMPLANT
PAD ARMBOARD 7.5X6 YLW CONV (MISCELLANEOUS) ×4 IMPLANT
SPONGE GAUZE 4X4 12PLY (GAUZE/BANDAGES/DRESSINGS) ×1 IMPLANT
SPONGE SURGIFOAM ABS GEL 100 (HEMOSTASIS) IMPLANT
STRIP CLOSURE SKIN 1/2X4 (GAUZE/BANDAGES/DRESSINGS) ×1 IMPLANT
SUT PROLENE 6 0 CC (SUTURE) ×1 IMPLANT
SUT PROLENE 7 0 BV 1 (SUTURE) ×1 IMPLANT
SUT VIC AB 3-0 SH 27 (SUTURE) ×2
SUT VIC AB 3-0 SH 27X BRD (SUTURE) ×1 IMPLANT
SUT VICRYL 4-0 PS2 18IN ABS (SUTURE) ×2 IMPLANT
TAPE CLOTH SURG 4X10 WHT LF (GAUZE/BANDAGES/DRESSINGS) ×1 IMPLANT
TOWEL OR 17X24 6PK STRL BLUE (TOWEL DISPOSABLE) ×2 IMPLANT
TOWEL OR 17X26 10 PK STRL BLUE (TOWEL DISPOSABLE) ×2 IMPLANT
UNDERPAD 30X30 INCONTINENT (UNDERPADS AND DIAPERS) ×2 IMPLANT
WATER STERILE IRR 1000ML POUR (IV SOLUTION) ×2 IMPLANT

## 2012-12-13 NOTE — Anesthesia Postprocedure Evaluation (Signed)
  Anesthesia Post-op Note  Patient: Timothy Faulkner  Procedure(s) Performed: Procedure(s) with comments: ARTERIOVENOUS (AV) FISTULA CREATION (Right) - Ultrasound guided  Patient Location: PACU  Anesthesia Type:MAC  Level of Consciousness: awake, oriented and patient cooperative  Airway and Oxygen Therapy: Patient Spontanous Breathing  Post-op Pain: mild  Post-op Assessment: Post-op Vital signs reviewed, Patient's Cardiovascular Status Stable, Respiratory Function Stable, Patent Airway, No signs of Nausea or vomiting and Pain level controlled  Post-op Vital Signs: stable  Complications: No apparent anesthesia complications

## 2012-12-13 NOTE — Anesthesia Preprocedure Evaluation (Signed)
Anesthesia Evaluation  Patient identified by MRN, date of birth, ID band Patient awake    Reviewed: Allergy & Precautions, H&P , NPO status , Patient's Chart, lab work & pertinent test results  Airway Mallampati: I TM Distance: >3 FB Neck ROM: full    Dental   Pulmonary          Cardiovascular hypertension, +CHF + Cardiac Defibrillator Rhythm:regular Rate:Normal     Neuro/Psych    GI/Hepatic   Endo/Other  diabetes, Type 2, Oral Hypoglycemic Agents  Renal/GU ESRF and DialysisRenal disease     Musculoskeletal   Abdominal   Peds  Hematology   Anesthesia Other Findings   Reproductive/Obstetrics                           Anesthesia Physical Anesthesia Plan  ASA: III  Anesthesia Plan: General and MAC   Post-op Pain Management:    Induction: Intravenous  Airway Management Planned: Simple Face Mask  Additional Equipment:   Intra-op Plan:   Post-operative Plan: Extubation in OR  Informed Consent: I have reviewed the patients History and Physical, chart, labs and discussed the procedure including the risks, benefits and alternatives for the proposed anesthesia with the patient or authorized representative who has indicated his/her understanding and acceptance.     Plan Discussed with: CRNA, Anesthesiologist and Surgeon  Anesthesia Plan Comments:         Anesthesia Quick Evaluation

## 2012-12-13 NOTE — Interval H&P Note (Signed)
History and Physical Interval Note:  12/13/2012 11:55 AM  Timothy Faulkner  has presented today for surgery, with the diagnosis of End Stage Renal Disease  The various methods of treatment have been discussed with the patient and family. After consideration of risks, benefits and other options for treatment, the patient has consented to  Procedure(s) with comments: ARTERIOVENOUS (AV) FISTULA CREATION (Right) - Creation Right Brachiocephalic Arteriovenous Fistula as a surgical intervention .  The patient's history has been reviewed, patient examined, no change in status, stable for surgery.  I have reviewed the patient's chart and labs.  Questions were answered to the patient's satisfaction.     Timothy Faulkner

## 2012-12-13 NOTE — Anesthesia Procedure Notes (Signed)
Procedure Name: MAC Date/Time: 12/13/2012 12:34 PM Performed by: Julian Reil Pre-anesthesia Checklist: Patient identified, Emergency Drugs available, Suction available and Patient being monitored Patient Re-evaluated:Patient Re-evaluated prior to inductionOxygen Delivery Method: Simple face mask Intubation Type: IV induction Placement Confirmation: positive ETCO2

## 2012-12-13 NOTE — Transfer of Care (Signed)
Immediate Anesthesia Transfer of Care Note  Patient: Timothy Faulkner  Procedure(s) Performed: Procedure(s) with comments: ARTERIOVENOUS (AV) FISTULA CREATION (Right) - Ultrasound guided  Patient Location: PACU  Anesthesia Type:MAC  Level of Consciousness: awake, alert , oriented and patient cooperative  Airway & Oxygen Therapy: Patient Spontanous Breathing and Patient connected to nasal cannula oxygen  Post-op Assessment: Report given to PACU RN, Post -op Vital signs reviewed and stable and Patient moving all extremities  Post vital signs: Reviewed and stable  Complications: No apparent anesthesia complications

## 2012-12-13 NOTE — Telephone Encounter (Addendum)
Message copied by Doristine Section on Mon Dec 13, 2012  4:07 PM ------      Message from: Alfonso Patten      Created: Mon Dec 13, 2012  1:41 PM       Be careful 2 pts on here.      ----- Message -----         From: [name], MD         Sent: 12/13/2012   1:37 PM           To: Patrici Ranks, Alfonso Patten, RN            [name] had a right upper arm AV fistula creation by myself. I need to see him in the office in one month. Tourist information centre manager.            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 John for discharge sheet. Assistant Roczniak       ------  notified pt. of fu appt. on 01-11-13 at 8:30 for Timothy Faulkner

## 2012-12-13 NOTE — H&P (View-Only) (Signed)
[facility] [place] HISTORY AND PHYSICAL   History of Present Illness:  Patient is a 67 y.o. year old male who presents for placement of a permanent hemodialysis access. The patient is right handed .  The patient is not currently on hemodialysis.  The cause of renal failure is thought to be secondary to diabetes and hypertension.  He does have a left-sided AICD.  Other chronic medical problems include cardiomyopathy, congestive heart failure elevated cholesterol . These are currently controlled. He is on aspirin and statin.  Past Medical History  Diagnosis Date  . HYPERTENSION   . HYPERCHOLESTEROLEMIA, MIXED   . CHF   . DIAB W/O COMP TYPE II/UNS NOT STATED UNCNTRL   . AUTOMATIC IMPLANTABLE CARDIAC DEFIBRILLATOR SITU   . 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 Anemia     Past Surgical History  Procedure Laterality Date  . Abcess drainage    . Cardiac defibrillator placement      [employer]     Social History History  Substance Use Topics  . Smoking status: Former Smoker    Types: Cigarettes    Quit date: 11/25/1992  . Smokeless tobacco: Never Used  . Alcohol Use: No    Family History Family History  Problem Relation Age of Onset  . Heart disease Father     Allergies  No Known Allergies   Current Outpatient Prescriptions  Medication Sig Dispense Refill  . aspirin EC 81 MG EC tablet Take 1 tablet (81 mg total) by mouth daily.  30 tablet  3  . calcitRIOL (ROCALTROL) 0.25 MCG capsule Take 0.25 mcg by mouth 3 (three) times a week.      . carvedilol (COREG) 25 MG tablet Take 25 mg by mouth 2 (two) times daily with a meal.        . digoxin (LANOXIN) 0.125 MG tablet Take 1 tablet (0.125 mg total) by mouth daily.  30 tablet  3  . ezetimibe-simvastatin (VYTORIN) 10-40 MG per tablet Take 1 tablet by mouth at bedtime.        . furosemide (LASIX) 20 MG tablet Take 40 mg by mouth daily.       Marland Kitchen glipiZIDE (GLUCOTROL XL) 5 MG 24 hr tablet Take 5  mg by mouth 2 (two) times daily.      . isosorbide-hydrALAZINE (BIDIL) 20-37.5 MG per tablet Take 1 tablet by mouth 2 (two) times daily.         No current facility-administered medications for this visit.    ROS:   General:  No weight loss, Fever, chills  HEENT: No recent headaches, no nasal bleeding, no visual changes, no sore throat  Neurologic: No dizziness, blackouts, seizures. No recent symptoms of stroke or mini- stroke. No recent episodes of slurred speech, or temporary blindness.  Cardiac: No recent episodes of chest pain/pressure, no shortness of breath at rest.  No shortness of breath with exertion.  Denies history of atrial fibrillation or irregular heartbeat  Vascular: No history of rest pain in feet.  No history of claudication.  No history of non-healing ulcer, No history of DVT   Pulmonary: No home oxygen, no productive cough, no hemoptysis,  No asthma or wheezing  Musculoskeletal:  [ ]  Arthritis, [ ]  Low back pain,  [ ]  Joint pain  Hematologic:No history of hypercoagulable state.  No history of easy bleeding.  No history of anemia  Gastrointestinal: No hematochezia or melena,  No gastroesophageal reflux, no trouble swallowing  Urinary: [ ]  chronic  Kidney disease, [ ]  on HD - [ ]  MWF or [ ]  TTHS, [ ]  Burning with urination, [ ]  Frequent urination, [ ]  Difficulty urinating;   Skin: No rashes  Psychological: No history of anxiety,  No history of depression   Physical Examination  Filed Vitals:   11/25/12 1341  BP: 153/71  Pulse: 62  Height: 5\' 8"  (1.727 m)  Weight: 196 lb 14.4 oz (89.313 kg)  SpO2: 100%    Body mass index is 29.95 kg/(m^2).  General:  Alert and oriented, no acute distress HEENT: Normal Neck: No bruit or JVD Pulmonary: Clear to auscultation bilaterally Cardiac: Regular Rate and Rhythm without murmur Skin: No rash Extremity Pulses:  2+ radial, brachial pulses bilaterally Musculoskeletal: No deformity or edema  Neurologic: Upper and  lower extremity motor 5/5 and symmetric  DATA: He had a vein mapping ultrasound today which shows the cephalic vein is fairly small bilaterally in the forearm. It is of good quality in the upper arm bilaterally. The basilic vein also is of good quality bilaterally in the upper arm. I reviewed and interpreted this study. Vein diameter is 4-6 mm bilaterally.   ASSESSMENT: Needs hemodialysis access.  Since he has defibrillator in the left side we will avoid his left arm and start the right arm access even though he is right-hand dominant.  I believe the risk of venous hypertension in the left side would be significant.   PLAN:  Right brachiocephalic AV fistula Monday, April 21. Risks benefits possible complications and procedure details were explained the patient today including but not limited to bleeding infection non-maturation of fistula ischemic steal  Ruta Hinds, MD [facility] [place]: [phone] Pager: [phone]

## 2012-12-13 NOTE — Op Note (Signed)
OPERATIVE REPORT  DATE OF SURGERY: 12/13/2012  PATIENT: Timothy Faulkner, 67 y.o. male MRN: [id number]  DOB: 1946/03/24  PRE-OPERATIVE DIAGNOSIS: Chronic renal insufficiency  POST-OPERATIVE DIAGNOSIS:  Same  PROCEDURE: Right brachiocephalic AV fistula  SURGEON:  Curt Jews, M.D.  ASSISTANT: Nurse  ANESTHESIA:  Local with sedation  EBL: Minimal ml  Total I/O In: 300 [I.V.:300] Out: -   BLOOD ADMINISTERED: None  DRAINS: None  SPECIMEN: None  COUNTS CORRECT:  YES  PLAN OF CARE:   PACU  PATIENT DISPOSITION:  PACU - hemodynamically stable  PROCEDURE DETAILS: The patient was taken to the operating room placed supine position where the area of the right arm was prepped and draped in usual sterile fashion. Incision was made over the antecubital space. SonoSite ultrasound was used to visualize this. The patient did have a small forearm cephalic vein but good caliber antecubital and proximal cephalic vein. The cephalic vein was isolated and tributary branches were ligated with 30 and 4-0 silk ties and divided. The vein was mobilized proximally and distally and was ligated distally and divided. The vein was marked to prevent twisting. The brachial artery was exposed through the same incision and was of good caliber. The artery was occluded proximally and distally and was opened with an 11 blade and extended longitudinally with Potts scissors. The vein was spatulated and sewn end-to-side to the artery with a running 6-0 Prolene suture. The usual flushing maneuvers were undertaken prior to completion of the anastomosis. Anastomosis completed and clamps removed. The patient had a good radial pulse and excellent thrill. The wound irrigated with saline. Hemostasis was obtained with electrocautery. The wounds are closed with 3-0 Vicryl suture and the subcutaneous and subcuticular tissue. Benzoin and Steri-Strips were applied.   Curt Jews, M.D. 12/13/2012 1:33 PM

## 2012-12-13 NOTE — Interval H&P Note (Signed)
History and Physical Interval Note:  12/13/2012 7:32 AM  Timothy Faulkner  has presented today for surgery, with the diagnosis of End Stage Renal Disease  The various methods of treatment have been discussed with the patient and family. After consideration of risks, benefits and other options for treatment, the patient has consented to  Procedure(s) with comments: ARTERIOVENOUS (AV) FISTULA CREATION (Right) - Creation Right Brachiocephalic Arteriovenous Fistula as a surgical intervention .  The patient's history has been reviewed, patient examined, no change in status, stable for surgery.  I have reviewed the patient's chart and labs.  Questions were answered to the patient's satisfaction.     [name] E

## 2012-12-13 NOTE — Preoperative (Signed)
Beta Blockers   Reason not to administer Beta Blockers:Not Applicable 

## 2012-12-13 NOTE — Progress Notes (Signed)
Dr. Redmond School called and notified of CBG 69, no new orders.  IVF running.

## 2012-12-14 MED FILL — Mupirocin Oint 2%: CUTANEOUS | Qty: 22 | Status: AC

## 2012-12-15 ENCOUNTER — Encounter (HOSPITAL_COMMUNITY): Payer: Self-pay | Admitting: Vascular Surgery

## 2012-12-28 ENCOUNTER — Other Ambulatory Visit (HOSPITAL_COMMUNITY): Payer: Self-pay | Admitting: *Deleted

## 2012-12-29 ENCOUNTER — Encounter (HOSPITAL_COMMUNITY)
Admission: RE | Admit: 2012-12-29 | Discharge: 2012-12-29 | Disposition: A | Discharge status: Home / Self Care | Payer: Medicare Other | Source: Ambulatory Visit | Attending: Nephrology | Admitting: Nephrology

## 2012-12-29 DIAGNOSIS — D631 Anemia in chronic kidney disease: Secondary | ICD-10-CM | POA: Insufficient documentation

## 2012-12-29 DIAGNOSIS — N186 End stage renal disease: Secondary | ICD-10-CM | POA: Insufficient documentation

## 2012-12-29 LAB — RENAL FUNCTION PANEL
CO2: 21 mEq/L (ref 19–32)
Calcium: 9.2 mg/dL (ref 8.4–10.5)
GFR calc Af Amer: 10 mL/min — ABNORMAL LOW (ref >90)
GFR calc non Af Amer: 8 mL/min — ABNORMAL LOW (ref >90)
Glucose, Bld: 47 mg/dL — ABNORMAL LOW (ref 70–99)
Phosphorus: 4.4 mg/dL (ref 2.3–4.6)
Potassium: 4 mEq/L (ref 3.5–5.1)
Sodium: 142 mEq/L (ref 135–145)

## 2012-12-29 LAB — IRON AND TIBC
Iron: 47 ug/dL (ref 42–135)
UIBC: 201 ug/dL (ref 125–400)

## 2012-12-29 MED ORDER — EPOETIN ALFA 10000 UNIT/ML IJ SOLN
INTRAMUSCULAR | Status: AC
  Filled 2012-12-29: qty 1

## 2012-12-29 MED ORDER — EPOETIN ALFA 10000 UNIT/ML IJ SOLN
10000.0000 [IU] | INTRAMUSCULAR | Status: DC
  Administered 2012-12-29: 10000 [IU] via SUBCUTANEOUS

## 2013-01-05 ENCOUNTER — Encounter (HOSPITAL_COMMUNITY)
Admission: RE | Admit: 2013-01-05 | Discharge: 2013-01-05 | Disposition: A | Discharge status: Home / Self Care | Payer: Medicare Other | Source: Ambulatory Visit | Attending: Nephrology | Admitting: Nephrology

## 2013-01-05 ENCOUNTER — Other Ambulatory Visit (HOSPITAL_COMMUNITY): Payer: Self-pay | Admitting: *Deleted

## 2013-01-05 LAB — POCT HEMOGLOBIN-HEMACUE: Hemoglobin: 11.2 g/dL — ABNORMAL LOW (ref 13.0–17.0)

## 2013-01-05 MED ORDER — EPOETIN ALFA 10000 UNIT/ML IJ SOLN
INTRAMUSCULAR | Status: AC
  Filled 2013-01-05: qty 1

## 2013-01-05 MED ORDER — EPOETIN ALFA 10000 UNIT/ML IJ SOLN
10000.0000 [IU] | INTRAMUSCULAR | Status: DC
  Administered 2013-01-05: 10000 [IU] via SUBCUTANEOUS

## 2013-01-11 ENCOUNTER — Ambulatory Visit: Payer: Medicare Other | Admitting: Vascular Surgery

## 2013-01-11 ENCOUNTER — Other Ambulatory Visit (HOSPITAL_COMMUNITY): Payer: Self-pay | Admitting: *Deleted

## 2013-01-11 DIAGNOSIS — Z01818 Encounter for other preprocedural examination: Secondary | ICD-10-CM | POA: Insufficient documentation

## 2013-01-12 ENCOUNTER — Encounter (HOSPITAL_COMMUNITY)
Admission: RE | Admit: 2013-01-12 | Discharge: 2013-01-12 | Disposition: A | Discharge status: Home / Self Care | Payer: Medicare Other | Source: Ambulatory Visit | Attending: Nephrology | Admitting: Nephrology

## 2013-01-12 DIAGNOSIS — N185 Chronic kidney disease, stage 5: Secondary | ICD-10-CM | POA: Insufficient documentation

## 2013-01-12 DIAGNOSIS — D638 Anemia in other chronic diseases classified elsewhere: Secondary | ICD-10-CM | POA: Insufficient documentation

## 2013-01-12 LAB — POCT HEMOGLOBIN-HEMACUE: Hemoglobin: 9.1 g/dL — ABNORMAL LOW (ref 13.0–17.0)

## 2013-01-12 MED ORDER — EPOETIN ALFA 10000 UNIT/ML IJ SOLN
INTRAMUSCULAR | Status: AC
  Administered 2013-01-12: 10000 [IU] via SUBCUTANEOUS
  Filled 2013-01-12: qty 1

## 2013-01-12 MED ORDER — SODIUM CHLORIDE 0.9 % IV SOLN
1020.0000 mg | Freq: Once | INTRAVENOUS | Status: AC
  Administered 2013-01-12: 1020 mg via INTRAVENOUS
  Filled 2013-01-12: qty 34

## 2013-01-12 MED ORDER — EPOETIN ALFA 10000 UNIT/ML IJ SOLN: 10000.0000 [IU] | INTRAMUSCULAR | Status: DC

## 2013-01-19 ENCOUNTER — Encounter (HOSPITAL_COMMUNITY)
Admission: RE | Admit: 2013-01-19 | Discharge: 2013-01-19 | Disposition: A | Discharge status: Home / Self Care | Payer: Medicare Other | Source: Ambulatory Visit | Attending: Nephrology | Admitting: Nephrology

## 2013-01-19 MED ORDER — EPOETIN ALFA 10000 UNIT/ML IJ SOLN
INTRAMUSCULAR | Status: AC
  Filled 2013-01-19: qty 1

## 2013-01-19 MED ORDER — EPOETIN ALFA 10000 UNIT/ML IJ SOLN
10000.0000 [IU] | INTRAMUSCULAR | Status: DC
  Administered 2013-01-19: 10000 [IU] via SUBCUTANEOUS

## 2013-01-24 ENCOUNTER — Encounter: Payer: Self-pay | Admitting: Vascular Surgery

## 2013-01-25 ENCOUNTER — Encounter: Payer: Self-pay | Admitting: Vascular Surgery

## 2013-01-25 ENCOUNTER — Ambulatory Visit (INDEPENDENT_AMBULATORY_CARE_PROVIDER_SITE_OTHER): Payer: Medicare Other | Admitting: Vascular Surgery

## 2013-01-25 VITALS — BP 154/73 | HR 69 | Resp 18 | Ht 68.0 in | Wt 219.5 lb

## 2013-01-25 DIAGNOSIS — N186 End stage renal disease: Secondary | ICD-10-CM

## 2013-01-25 NOTE — Progress Notes (Signed)
The patient has today for followup of his right upper arm AV fistula creation by myself on 12/13/2012. He did well with this local outpatient procedure. He is done quite well following this. His incisions are well healed and he has no further discomfort related to the incision.  His fistula is maturing quite nicely one month out. He has excellent thrill and excellent size maturation and this does run quite superficial. I feel that he has a very high likelihood this will be successful for him if he doesn't progressed to renal insufficiency and failure. He will follow up with [place] on an as-needed basis

## 2013-01-26 ENCOUNTER — Encounter (HOSPITAL_COMMUNITY): Payer: Medicare Other

## 2013-01-27 ENCOUNTER — Ambulatory Visit (INDEPENDENT_AMBULATORY_CARE_PROVIDER_SITE_OTHER): Payer: Medicare Other | Admitting: *Deleted

## 2013-01-27 DIAGNOSIS — I509 Heart failure, unspecified: Secondary | ICD-10-CM

## 2013-01-27 DIAGNOSIS — Z95810 Presence of automatic (implantable) cardiac defibrillator: Secondary | ICD-10-CM

## 2013-02-02 ENCOUNTER — Encounter (HOSPITAL_COMMUNITY)
Admission: RE | Admit: 2013-02-02 | Discharge: 2013-02-02 | Disposition: A | Discharge status: Home / Self Care | Payer: Medicare Other | Source: Ambulatory Visit | Attending: Nephrology | Admitting: Nephrology

## 2013-02-02 LAB — IRON AND TIBC
Iron: 34 ug/dL — ABNORMAL LOW (ref 42–135)
UIBC: 165 ug/dL (ref 125–400)

## 2013-02-02 LAB — RENAL FUNCTION PANEL
Albumin: 2.9 g/dL — ABNORMAL LOW (ref 3.5–5.2)
Calcium: 8.5 mg/dL (ref 8.4–10.5)
GFR calc Af Amer: 10 mL/min — ABNORMAL LOW (ref >90)
GFR calc non Af Amer: 8 mL/min — ABNORMAL LOW (ref >90)
Phosphorus: 5 mg/dL — ABNORMAL HIGH (ref 2.3–4.6)
Potassium: 3.9 mEq/L (ref 3.5–5.1)
Sodium: 141 mEq/L (ref 135–145)

## 2013-02-02 LAB — POCT HEMOGLOBIN-HEMACUE: Hemoglobin: 9.8 g/dL — ABNORMAL LOW (ref 13.0–17.0)

## 2013-02-02 MED ORDER — EPOETIN ALFA 10000 UNIT/ML IJ SOLN
INTRAMUSCULAR | Status: AC
  Filled 2013-02-02: qty 1

## 2013-02-02 MED ORDER — EPOETIN ALFA 10000 UNIT/ML IJ SOLN
10000.0000 [IU] | INTRAMUSCULAR | Status: DC
  Administered 2013-02-02: 10000 [IU] via SUBCUTANEOUS

## 2013-02-03 ENCOUNTER — Encounter: Payer: Self-pay | Admitting: Internal Medicine

## 2013-02-14 LAB — REMOTE ICD DEVICE
DEV-0020ICD: NEGATIVE
DEVICE MODEL ICD: 268731
FVT: 0
PACEART VT: 0
RV LEAD AMPLITUDE: 18.7 mv
RV LEAD IMPEDENCE ICD: 470 Ohm
VENTRICULAR PACING ICD: 0 pct

## 2013-02-15 ENCOUNTER — Other Ambulatory Visit (HOSPITAL_COMMUNITY): Payer: Self-pay | Admitting: *Deleted

## 2013-02-16 ENCOUNTER — Encounter (HOSPITAL_COMMUNITY)
Admission: RE | Admit: 2013-02-16 | Discharge: 2013-02-16 | Disposition: A | Discharge status: Home / Self Care | Payer: Medicare Other | Source: Ambulatory Visit | Attending: Nephrology | Admitting: Nephrology

## 2013-02-16 DIAGNOSIS — I12 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 chronic kidney disease or end stage renal disease: Secondary | ICD-10-CM | POA: Diagnosis not present

## 2013-02-16 DIAGNOSIS — N039 Chronic nephritic syndrome with unspecified morphologic changes: Secondary | ICD-10-CM | POA: Diagnosis not present

## 2013-02-16 DIAGNOSIS — N186 End stage renal disease: Secondary | ICD-10-CM | POA: Diagnosis not present

## 2013-02-16 DIAGNOSIS — D631 Anemia in chronic kidney disease: Secondary | ICD-10-CM | POA: Diagnosis not present

## 2013-02-16 DIAGNOSIS — D649 Anemia, unspecified: Secondary | ICD-10-CM | POA: Diagnosis present

## 2013-02-16 LAB — POCT HEMOGLOBIN-HEMACUE: Hemoglobin: 11.1 g/dL — ABNORMAL LOW (ref 13.0–17.0)

## 2013-02-16 MED ORDER — SODIUM CHLORIDE 0.9 % IV SOLN
1020.0000 mg | Freq: Once | INTRAVENOUS | Status: AC
  Administered 2013-02-16: 1020 mg via INTRAVENOUS
  Filled 2013-02-16: qty 34

## 2013-02-16 MED ORDER — EPOETIN ALFA 10000 UNIT/ML IJ SOLN: 10000.0000 [IU] | INTRAMUSCULAR | Status: DC

## 2013-02-16 MED ORDER — EPOETIN ALFA 10000 UNIT/ML IJ SOLN
INTRAMUSCULAR | Status: AC
  Administered 2013-02-16: 10000 [IU] via SUBCUTANEOUS
  Filled 2013-02-16: qty 1

## 2013-02-22 ENCOUNTER — Other Ambulatory Visit (HOSPITAL_COMMUNITY): Payer: Self-pay | Admitting: *Deleted

## 2013-02-23 ENCOUNTER — Encounter (HOSPITAL_COMMUNITY)
Admission: RE | Admit: 2013-02-23 | Discharge: 2013-02-23 | Disposition: A | Discharge status: Home / Self Care | Payer: Medicare Other | Source: Ambulatory Visit | Attending: Nephrology | Admitting: Nephrology

## 2013-02-23 DIAGNOSIS — D631 Anemia in chronic kidney disease: Secondary | ICD-10-CM | POA: Diagnosis not present

## 2013-02-23 LAB — POCT HEMOGLOBIN-HEMACUE: Hemoglobin: 11.9 g/dL — ABNORMAL LOW (ref 13.0–17.0)

## 2013-02-23 MED ORDER — EPOETIN ALFA 10000 UNIT/ML IJ SOLN: 10000.0000 [IU] | INTRAMUSCULAR | Status: DC

## 2013-03-09 ENCOUNTER — Encounter (HOSPITAL_COMMUNITY)
Admission: RE | Admit: 2013-03-09 | Discharge: 2013-03-09 | Disposition: A | Discharge status: Home / Self Care | Payer: Medicare Other | Source: Ambulatory Visit | Attending: Nephrology | Admitting: Nephrology

## 2013-03-09 DIAGNOSIS — D631 Anemia in chronic kidney disease: Secondary | ICD-10-CM | POA: Diagnosis not present

## 2013-03-09 LAB — RENAL FUNCTION PANEL
BUN: 76 mg/dL — ABNORMAL HIGH (ref 6–23)
CO2: 19 mEq/L (ref 19–32)
Chloride: 106 mEq/L (ref 96–112)
Creatinine, Ser: 7.28 mg/dL — ABNORMAL HIGH (ref 0.50–1.35)
GFR calc non Af Amer: 7 mL/min — ABNORMAL LOW (ref >90)
Potassium: 4.4 mEq/L (ref 3.5–5.1)

## 2013-03-09 LAB — IRON AND TIBC
Iron: 43 ug/dL (ref 42–135)
Saturation Ratios: 20 % (ref 20–55)
TIBC: 214 ug/dL — ABNORMAL LOW (ref 215–435)

## 2013-03-09 LAB — MAGNESIUM: Magnesium: 2.5 mg/dL (ref 1.5–2.5)

## 2013-03-09 MED ORDER — EPOETIN ALFA 10000 UNIT/ML IJ SOLN
10000.0000 [IU] | INTRAMUSCULAR | Status: DC
  Administered 2013-03-09: 10000 [IU] via SUBCUTANEOUS

## 2013-03-09 MED ORDER — EPOETIN ALFA 10000 UNIT/ML IJ SOLN
INTRAMUSCULAR | Status: AC
  Filled 2013-03-09: qty 1

## 2013-03-11 ENCOUNTER — Emergency Department (HOSPITAL_COMMUNITY): Payer: Medicare Other

## 2013-03-11 ENCOUNTER — Inpatient Hospital Stay (HOSPITAL_COMMUNITY)
Admission: EM | Admit: 2013-03-11 | Discharge: 2013-03-17 | DRG: 637 | Disposition: A | Discharge status: Home / Self Care | Payer: Medicare Other | Attending: Cardiovascular Disease | Admitting: Cardiovascular Disease

## 2013-03-11 ENCOUNTER — Encounter (HOSPITAL_COMMUNITY): Payer: Self-pay | Admitting: Emergency Medicine

## 2013-03-11 DIAGNOSIS — I509 Heart failure, unspecified: Secondary | ICD-10-CM

## 2013-03-11 DIAGNOSIS — N179 Acute kidney failure, unspecified: Secondary | ICD-10-CM | POA: Diagnosis present

## 2013-03-11 DIAGNOSIS — Z7982 Long term (current) use of aspirin: Secondary | ICD-10-CM

## 2013-03-11 DIAGNOSIS — D631 Anemia in chronic kidney disease: Secondary | ICD-10-CM | POA: Diagnosis present

## 2013-03-11 DIAGNOSIS — E7800 Pure hypercholesterolemia, unspecified: Secondary | ICD-10-CM | POA: Diagnosis present

## 2013-03-11 DIAGNOSIS — I428 Other cardiomyopathies: Secondary | ICD-10-CM | POA: Diagnosis present

## 2013-03-11 DIAGNOSIS — E871 Hypo-osmolality and hyponatremia: Secondary | ICD-10-CM | POA: Diagnosis not present

## 2013-03-11 DIAGNOSIS — T383X5A Adverse effect of insulin and oral hypoglycemic [antidiabetic] drugs, initial encounter: Secondary | ICD-10-CM | POA: Diagnosis present

## 2013-03-11 DIAGNOSIS — M109 Gout, unspecified: Secondary | ICD-10-CM | POA: Diagnosis present

## 2013-03-11 DIAGNOSIS — I12 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 chronic kidney disease or end stage renal disease: Secondary | ICD-10-CM | POA: Diagnosis present

## 2013-03-11 DIAGNOSIS — Z95810 Presence of automatic (implantable) cardiac defibrillator: Secondary | ICD-10-CM

## 2013-03-11 DIAGNOSIS — E875 Hyperkalemia: Secondary | ICD-10-CM | POA: Diagnosis not present

## 2013-03-11 DIAGNOSIS — N186 End stage renal disease: Secondary | ICD-10-CM | POA: Diagnosis present

## 2013-03-11 DIAGNOSIS — Z79899 Other long term (current) drug therapy: Secondary | ICD-10-CM

## 2013-03-11 DIAGNOSIS — E162 Hypoglycemia, unspecified: Secondary | ICD-10-CM

## 2013-03-11 DIAGNOSIS — E1169 Type 2 diabetes mellitus with other specified complication: Principal | ICD-10-CM | POA: Diagnosis present

## 2013-03-11 DIAGNOSIS — I5023 Acute on chronic systolic (congestive) heart failure: Secondary | ICD-10-CM | POA: Diagnosis present

## 2013-03-11 DIAGNOSIS — R188 Other ascites: Secondary | ICD-10-CM | POA: Diagnosis present

## 2013-03-11 DIAGNOSIS — Z87891 Personal history of nicotine dependence: Secondary | ICD-10-CM

## 2013-03-11 HISTORY — DX: Cardiac murmur, unspecified: R01.1

## 2013-03-11 HISTORY — DX: Presence of automatic (implantable) cardiac defibrillator: Z95.810

## 2013-03-11 LAB — URINE MICROSCOPIC-ADD ON

## 2013-03-11 LAB — CBC WITH DIFFERENTIAL/PLATELET
Basophils Absolute: 0 10*3/uL (ref 0.0–0.1)
Basophils Relative: 0 % (ref 0–1)
Eosinophils Absolute: 0 10*3/uL (ref 0.0–0.7)
Eosinophils Relative: 0 % (ref 0–5)
HCT: 35.7 % — ABNORMAL LOW (ref 39.0–52.0)
Hemoglobin: 11.7 g/dL — ABNORMAL LOW (ref 13.0–17.0)
Lymphocytes Relative: 13 % (ref 12–46)
Lymphs Abs: 0.6 10*3/uL — ABNORMAL LOW (ref 0.7–4.0)
MCH: 31.4 pg (ref 26.0–34.0)
MCHC: 32.8 g/dL (ref 30.0–36.0)
MCV: 95.7 fL (ref 78.0–100.0)
Monocytes Absolute: 0.6 10*3/uL (ref 0.1–1.0)
Monocytes Relative: 14 % — ABNORMAL HIGH (ref 3–12)
Neutro Abs: 3.3 10*3/uL (ref 1.7–7.7)
Neutrophils Relative %: 72 % (ref 43–77)
Platelets: 122 10*3/uL — ABNORMAL LOW (ref 150–400)
RBC: 3.73 MIL/uL — ABNORMAL LOW (ref 4.22–5.81)
RDW: 16 % — ABNORMAL HIGH (ref 11.5–15.5)
WBC: 4.5 10*3/uL (ref 4.0–10.5)

## 2013-03-11 LAB — COMPREHENSIVE METABOLIC PANEL
ALT: 24 U/L (ref 0–53)
AST: 19 U/L (ref 0–37)
Albumin: 3.1 g/dL — ABNORMAL LOW (ref 3.5–5.2)
Alkaline Phosphatase: 74 U/L (ref 39–117)
BUN: 79 mg/dL — ABNORMAL HIGH (ref 6–23)
CO2: 17 mEq/L — ABNORMAL LOW (ref 19–32)
Calcium: 8.6 mg/dL (ref 8.4–10.5)
Chloride: 103 mEq/L (ref 96–112)
Creatinine, Ser: 7.08 mg/dL — ABNORMAL HIGH (ref 0.50–1.35)
GFR calc Af Amer: 8 mL/min — ABNORMAL LOW (ref >90)
GFR calc non Af Amer: 7 mL/min — ABNORMAL LOW (ref >90)
Glucose, Bld: 32 mg/dL — CL (ref 70–99)
Potassium: 4.9 mEq/L (ref 3.5–5.1)
Sodium: 132 mEq/L — ABNORMAL LOW (ref 135–145)
Total Bilirubin: 0.4 mg/dL (ref 0.3–1.2)
Total Protein: 6.6 g/dL (ref 6.0–8.3)

## 2013-03-11 LAB — URINALYSIS, ROUTINE W REFLEX MICROSCOPIC
Bilirubin Urine: NEGATIVE
Glucose, UA: NEGATIVE mg/dL
Hgb urine dipstick: NEGATIVE
Ketones, ur: NEGATIVE mg/dL
Nitrite: NEGATIVE
Protein, ur: >300 mg/dL — AB
Specific Gravity, Urine: 1.017 (ref 1.005–1.030)
Urobilinogen, UA: 1 mg/dL (ref 0.0–1.0)
pH: 5 (ref 5.0–8.0)

## 2013-03-11 LAB — GLUCOSE, CAPILLARY
Glucose-Capillary: 82 mg/dL (ref 70–99)
Glucose-Capillary: 16 mg/dL — CL (ref 70–99)
Glucose-Capillary: 52 mg/dL — ABNORMAL LOW (ref 70–99)
Glucose-Capillary: 60 mg/dL — ABNORMAL LOW (ref 70–99)

## 2013-03-11 LAB — DIGOXIN LEVEL: Digoxin Level: 0.5 ng/mL — ABNORMAL LOW (ref 0.8–2.0)

## 2013-03-11 MED ORDER — DEXTROSE 5 % IV SOLN
Freq: Once | INTRAVENOUS | Status: AC
  Administered 2013-03-11: 18:00:00 via INTRAVENOUS

## 2013-03-11 MED ORDER — ISOSORB DINITRATE-HYDRALAZINE 20-37.5 MG PO TABS
1.0000 | ORAL_TABLET | Freq: Two times a day (BID) | ORAL | Status: DC
  Administered 2013-03-11 – 2013-03-17 (×12): 1 via ORAL
  Filled 2013-03-11 (×13): qty 1

## 2013-03-11 MED ORDER — ONDANSETRON HCL 4 MG/2ML IJ SOLN: 4.0000 mg | Freq: Four times a day (QID) | INTRAMUSCULAR | Status: DC | PRN

## 2013-03-11 MED ORDER — DEXTROSE 50 % IV SOLN
INTRAVENOUS | Status: AC
  Filled 2013-03-11: qty 50

## 2013-03-11 MED ORDER — GLUCOSE 40 % PO GEL: 1.0000 | Freq: Once | ORAL | Status: AC

## 2013-03-11 MED ORDER — DEXTROSE 50 % IV SOLN
1.0000 | Freq: Once | INTRAVENOUS | Status: AC
  Administered 2013-03-11: 50 mL via INTRAVENOUS

## 2013-03-11 MED ORDER — GLUCOSE 40 % PO GEL
ORAL | Status: AC
  Administered 2013-03-11: 37.5 g
  Filled 2013-03-11: qty 1

## 2013-03-11 MED ORDER — ONDANSETRON HCL 4 MG PO TABS
4.0000 mg | ORAL_TABLET | Freq: Four times a day (QID) | ORAL | Status: DC | PRN
  Administered 2013-03-13: 4 mg via ORAL
  Filled 2013-03-11: qty 1

## 2013-03-11 MED ORDER — CALCITRIOL 0.25 MCG PO CAPS
0.2500 ug | ORAL_CAPSULE | ORAL | Status: DC
  Administered 2013-03-11 – 2013-03-14 (×2): 0.25 ug via ORAL
  Filled 2013-03-11 (×4): qty 1

## 2013-03-11 MED ORDER — ASPIRIN EC 81 MG PO TBEC
81.0000 mg | DELAYED_RELEASE_TABLET | Freq: Every day | ORAL | Status: DC
  Administered 2013-03-11 – 2013-03-17 (×7): 81 mg via ORAL
  Filled 2013-03-11 (×7): qty 1

## 2013-03-11 MED ORDER — INSULIN ASPART 100 UNIT/ML ~~LOC~~ SOLN
0.0000 [IU] | Freq: Three times a day (TID) | SUBCUTANEOUS | Status: DC
  Administered 2013-03-14: 1 [IU] via SUBCUTANEOUS
  Administered 2013-03-16 – 2013-03-17 (×2): 2 [IU] via SUBCUTANEOUS

## 2013-03-11 MED ORDER — SODIUM CHLORIDE 0.9 % IJ SOLN
3.0000 mL | Freq: Two times a day (BID) | INTRAMUSCULAR | Status: DC
  Administered 2013-03-12 – 2013-03-17 (×8): 3 mL via INTRAVENOUS

## 2013-03-11 MED ORDER — HEPARIN SODIUM (PORCINE) 5000 UNIT/ML IJ SOLN
5000.0000 [IU] | Freq: Three times a day (TID) | INTRAMUSCULAR | Status: DC
  Administered 2013-03-11 – 2013-03-13 (×6): 5000 [IU] via SUBCUTANEOUS
  Filled 2013-03-11 (×11): qty 1

## 2013-03-11 MED ORDER — DOCUSATE SODIUM 100 MG PO CAPS
100.0000 mg | ORAL_CAPSULE | Freq: Two times a day (BID) | ORAL | Status: DC
  Administered 2013-03-11 – 2013-03-17 (×12): 100 mg via ORAL
  Filled 2013-03-11 (×13): qty 1

## 2013-03-11 MED ORDER — FUROSEMIDE 10 MG/ML IJ SOLN
40.0000 mg | Freq: Two times a day (BID) | INTRAMUSCULAR | Status: DC
  Administered 2013-03-11 – 2013-03-12 (×2): 40 mg via INTRAVENOUS
  Filled 2013-03-11 (×5): qty 4

## 2013-03-11 MED ORDER — DEXTROSE 50 % IV SOLN
INTRAVENOUS | Status: AC
  Administered 2013-03-11: 50 mL
  Filled 2013-03-11: qty 50

## 2013-03-11 MED ORDER — ADULT MULTIVITAMIN W/MINERALS CH
1.0000 | ORAL_TABLET | Freq: Every day | ORAL | Status: DC
  Administered 2013-03-11 – 2013-03-17 (×7): 1 via ORAL
  Filled 2013-03-11 (×7): qty 1

## 2013-03-11 MED ORDER — GLUCOSE 40 % PO GEL
ORAL | Status: AC
  Administered 2013-03-11: 37.5 g via ORAL
  Filled 2013-03-11: qty 2

## 2013-03-11 MED ORDER — DEXTROSE 50 % IV SOLN
1.0000 | Freq: Once | INTRAVENOUS | Status: AC
  Administered 2013-03-11: 50 mL via INTRAVENOUS
  Filled 2013-03-11: qty 50

## 2013-03-11 NOTE — ED Provider Notes (Signed)
CSN: [id number]     Arrival date & time 03/11/13  1405 History     First MD Initiated Contact with Patient 03/11/13 1413     Chief Complaint  Patient presents with  . Hypoglycemia   (Consider location/radiation/quality/duration/timing/severity/associated sxs/prior Treatment) HPI Patient presents to the emergency department with hypoglycemia, that has been occurring over the last few days.  Patient, states, that he recently had an AV fistula placed due to renal failure.  Patient, states, that he's not had any nausea, vomiting, chest pain, shortness of breath, headache, blurred vision, weakness, fever, cough, runny nose, sore throat, dysuria, back pain, or neck pain.  The patient, states, that last night.  He had a hypoglycemic episode, and EMS was called.  The patient, states, that he did not want to go by EMS to the hospital.  Patient, states, that this afternoon.  He had another episode of, hypoglycemia, that's what brought into the emergency department. Past Medical History  Diagnosis Date  . HYPERTENSION   . HYPERCHOLESTEROLEMIA, MIXED   . CHF   . DIAB W/O COMP TYPE II/UNS NOT STATED UNCNTRL   . AUTOMATIC IMPLANTABLE CARDIAC DEFIBRILLATOR SITU   . 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 Anemia   . ICD (implantable cardiac defibrillator) in place     [employer]  . Chronic kidney disease     end stage renal disease   Past Surgical History  Procedure Laterality Date  . Abcess drainage    . Cardiac defibrillator placement      [employer]  . Av fistula placement Right 12/13/2012    Procedure: ARTERIOVENOUS (AV) FISTULA CREATION;  Surgeon: [name], MD;  Location: [facility] OR;  Service: Vascular;  Laterality: Right;  Ultrasound guided   Family History  Problem Relation Age of Onset  . Heart disease Father    History  Substance Use Topics  . Smoking status: Former Smoker    Types: Cigarettes    Quit date: 11/25/1992  . Smokeless tobacco: Never Used  . Alcohol  Use: No    Review of Systems All other systems negative except as documented in the HPI. All pertinent positives and negatives as reviewed in the HPI. Allergies  Review of patient's allergies indicates no known allergies.  Home Medications   Current Outpatient Rx  Name  Route  Sig  Dispense  Refill  . aspirin EC 81 MG EC tablet   Oral   Take 1 tablet (81 mg total) by mouth daily.   30 tablet   3   . calcitRIOL (ROCALTROL) 0.25 MCG capsule   Oral   Take 0.25 mcg by mouth 3 (three) times a week. Monday, Wednesday and Friday         . carvedilol (COREG) 25 MG tablet   Oral   Take 25 mg by mouth 2 (two) times daily with a meal.           . colchicine 0.6 MG tablet   Oral   Take 0.6 mg by mouth daily as needed.         . digoxin (LANOXIN) 0.125 MG tablet   Oral   Take 1 tablet (0.125 mg total) by mouth daily.   30 tablet   3   . ezetimibe-simvastatin (VYTORIN) 10-40 MG per tablet   Oral   Take 1 tablet by mouth at bedtime.           . furosemide (LASIX) 40 MG tablet   Oral   Take 40 mg by  mouth 2 (two) times daily.         Marland Kitchen glipiZIDE (GLUCOTROL XL) 5 MG 24 hr tablet   Oral   Take 5 mg by mouth daily.          . isosorbide-hydrALAZINE (BIDIL) 20-37.5 MG per tablet   Oral   Take 1 tablet by mouth 2 (two) times daily.           . Vitamin D, Ergocalciferol, (DRISDOL) 50000 UNITS CAPS   Oral   Take 50,000 Units by mouth every 7 (seven) days. mondays          BP 131/113  Pulse 63  Temp(Src) 97.5 F (36.4 C) (Oral)  Resp 14  SpO2 100% Physical Exam  Nursing note and vitals reviewed. Constitutional: He is oriented to person, place, and time. He appears well-developed and well-nourished.  HENT:  Head: Normocephalic and atraumatic.  Mouth/Throat: Oropharynx is clear and moist.  Eyes: Pupils are equal, round, and reactive to light.  Neck: Normal range of motion. Neck supple.  Cardiovascular: Normal rate, regular rhythm and normal heart sounds.   Exam reveals no gallop and no friction rub.   No murmur heard. Pulmonary/Chest: Effort normal and breath sounds normal. No respiratory distress.  Neurological: He is alert and oriented to person, place, and time.  Skin: Skin is warm and dry.    ED Course   Procedures (including critical care time)  Labs Reviewed  CBC WITH DIFFERENTIAL - Abnormal; Notable for the following:    RBC 3.73 (*)    Hemoglobin 11.7 (*)    HCT 35.7 (*)    RDW 16.0 (*)    Platelets 122 (*)    Lymphs Abs 0.6 (*)    Monocytes Relative 14 (*)    All other components within normal limits  COMPREHENSIVE METABOLIC PANEL - Abnormal; Notable for the following:    Sodium 132 (*)    CO2 17 (*)    Glucose, Bld 32 (*)    BUN 79 (*)    Creatinine, Ser 7.08 (*)    Albumin 3.1 (*)    GFR calc non Af Amer 7 (*)    GFR calc Af Amer 8 (*)    All other components within normal limits  URINALYSIS, ROUTINE W REFLEX MICROSCOPIC - Abnormal; Notable for the following:    APPearance CLOUDY (*)    Protein, ur >300 (*)    Leukocytes, UA SMALL (*)    All other components within normal limits  URINE MICROSCOPIC-ADD ON - Abnormal; Notable for the following:    Casts GRANULAR CAST (*)    All other components within normal limits  DIGOXIN LEVEL - Abnormal; Notable for the following:    Digoxin Level 0.5 (*)    All other components within normal limits  GLUCOSE, CAPILLARY - Abnormal; Notable for the following:    Glucose-Capillary 16 (*)    All other components within normal limits  GLUCOSE, CAPILLARY - Abnormal; Notable for the following:    Glucose-Capillary 257 (*)    All other components within normal limits  GLUCOSE, CAPILLARY   Dg Chest 2 View  03/11/2013   *RADIOLOGY REPORT*  Clinical Data: The patient states he passed out.  Low blood sugar.  CHEST - 2 VIEW  Comparison: Chest radiograph 08/23/2012  Findings: Stable cardiomegaly.  Stable support apparatus.  Mild pulmonary venous congestion without overt pulmonary edema.   No large consolidative pulmonary opacity.  No pleural effusion or pneumothorax.  IMPRESSION: Cardiomegaly, mild pulmonary venous hypertension.  No overt pulmonary edema.   Original Report Authenticated By: Lovey Newcomer, M.D   Patient continues to have episodic hypoglycemia, here in the emergency department.  He will be admitted for further monitoring and care of his hypoglycemia.  I feel it is most likely associated with his glipizide and renal failure.  I spoke with Dr. Lorrene Reid of nephrology, who agreed that this could be related to his hypoglycemic episodes.  I spoke with Dr. Doylene Canard, who will be in to admit the patient to the hospital MDM  MDM Reviewed: nursing note, vitals and previous chart Reviewed previous: labs Interpretation: labs and x-ray Consults: admitting MD     Brent General, PA-C 03/11/13 1916

## 2013-03-11 NOTE — ED Notes (Addendum)
Pt from home, lives with wife. Upon [employer] arrival, pt's cbg 35, pt confused and mumbling. EMS gave 25g glucose IV (d10 mini bag). CBG then 95. Pt takes glipizide. Pt now A&O x4.  Pt hx CHF, +3 pitting edema bilateral LE, prolonged exhalation, expiratory wheeze, abd distention.

## 2013-03-11 NOTE — H&P (Signed)
Timothy Faulkner is an 67 y.o. male.   Chief Complaint: Hypoglycemic attack HPI: 67 years old male with acute on chronic renal failure has recurrent hypoglycemic attacks from chronic glipizide use. No chest pain or fever or nausea or vomiting.  Past Medical History  Diagnosis Date  . HYPERTENSION   . HYPERCHOLESTEROLEMIA, MIXED   . CHF   . DIAB W/O COMP TYPE II/UNS NOT STATED UNCNTRL   . AUTOMATIC IMPLANTABLE CARDIAC DEFIBRILLATOR SITU   . 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 Anemia   . ICD (implantable cardiac defibrillator) in place     [employer]  . Chronic kidney disease     end stage renal disease      Past Surgical History  Procedure Laterality Date  . Abcess drainage    . Cardiac defibrillator placement      [employer]  . Av fistula placement Right 12/13/2012    Procedure: ARTERIOVENOUS (AV) FISTULA CREATION;  Surgeon: [name], MD;  Location: [facility] OR;  Service: Vascular;  Laterality: Right;  Ultrasound guided    Family History  Problem Relation Age of Onset  . Heart disease Father    Social History:  reports that he quit smoking about 20 years ago. His smoking use included Cigarettes. He smoked 0.00 packs per day. He has never used smokeless tobacco. He reports that he does not drink alcohol or use illicit drugs.  Allergies: No Known Allergies   (Not in a hospital admission)  Results for orders placed during the hospital encounter of 03/11/13 (from the past 48 hour(s))  CBC WITH DIFFERENTIAL     Status: Abnormal   Collection Time    03/11/13  2:56 PM      Result Value Range   WBC 4.5  4.0 - 10.5 K/uL   RBC 3.73 (*) 4.22 - 5.81 MIL/uL   Hemoglobin 11.7 (*) 13.0 - 17.0 g/dL   HCT 35.7 (*) 39.0 - 52.0 %   MCV 95.7  78.0 - 100.0 fL   MCH 31.4  26.0 - 34.0 pg   MCHC 32.8  30.0 - 36.0 g/dL   RDW 16.0 (*) 11.5 - 15.5 %   Platelets 122 (*) 150 - 400 K/uL   Neutrophils Relative % 72  43 - 77 %   Neutro Abs 3.3  1.7 - 7.7 K/uL   Lymphocytes  Relative 13  12 - 46 %   Lymphs Abs 0.6 (*) 0.7 - 4.0 K/uL   Monocytes Relative 14 (*) 3 - 12 %   Monocytes Absolute 0.6  0.1 - 1.0 K/uL   Eosinophils Relative 0  0 - 5 %   Eosinophils Absolute 0.0  0.0 - 0.7 K/uL   Basophils Relative 0  0 - 1 %   Basophils Absolute 0.0  0.0 - 0.1 K/uL  COMPREHENSIVE METABOLIC PANEL     Status: Abnormal   Collection Time    03/11/13  2:56 PM      Result Value Range   Sodium 132 (*) 135 - 145 mEq/L   Potassium 4.9  3.5 - 5.1 mEq/L   Chloride 103  96 - 112 mEq/L   CO2 17 (*) 19 - 32 mEq/L   Glucose, Bld 32 (*) 70 - 99 mg/dL   Comment: CRITICAL RESULT CALLED TO, READ BACK BY AND VERIFIED WITH:     K MUELLER,RN 1533 03/11/13 D BRADLEY   BUN 79 (*) 6 - 23 mg/dL   Creatinine, Ser 7.08 (*) 0.50 - 1.35 mg/dL  Calcium 8.6  8.4 - 10.5 mg/dL   Total Protein 6.6  6.0 - 8.3 g/dL   Albumin 3.1 (*) 3.5 - 5.2 g/dL   AST 19  0 - 37 U/L   ALT 24  0 - 53 U/L   Alkaline Phosphatase 74  39 - 117 U/L   Total Bilirubin 0.4  0.3 - 1.2 mg/dL   GFR calc non Af Amer 7 (*) >90 mL/min   GFR calc Af Amer 8 (*) >90 mL/min   Comment:            The eGFR has been calculated     using the CKD EPI equation.     This calculation has not been     validated in all clinical     situations.     eGFR's persistently     <90 mL/min signify     possible Chronic Kidney Disease.  GLUCOSE, CAPILLARY     Status: None   Collection Time    03/11/13  3:37 PM      Result Value Range   Glucose-Capillary 82  70 - 99 mg/dL  URINALYSIS, ROUTINE W REFLEX MICROSCOPIC     Status: Abnormal   Collection Time    03/11/13  3:48 PM      Result Value Range   Color, Urine YELLOW  YELLOW   APPearance CLOUDY (*) CLEAR   Specific Gravity, Urine 1.017  1.005 - 1.030   pH 5.0  5.0 - 8.0   Glucose, UA NEGATIVE  NEGATIVE mg/dL   Hgb urine dipstick NEGATIVE  NEGATIVE   Bilirubin Urine NEGATIVE  NEGATIVE   Ketones, ur NEGATIVE  NEGATIVE mg/dL   Protein, ur >300 (*) NEGATIVE mg/dL   Urobilinogen, UA  1.0  0.0 - 1.0 mg/dL   Nitrite NEGATIVE  NEGATIVE   Leukocytes, UA SMALL (*) NEGATIVE  URINE MICROSCOPIC-ADD ON     Status: Abnormal   Collection Time    03/11/13  3:48 PM      Result Value Range   Squamous Epithelial / LPF RARE  RARE   WBC, UA 0-2  <3 WBC/hpf   RBC / HPF 0-2  <3 RBC/hpf   Bacteria, UA RARE  RARE   Casts GRANULAR CAST (*) NEGATIVE  DIGOXIN LEVEL     Status: Abnormal   Collection Time    03/11/13  5:14 PM      Result Value Range   Digoxin Level 0.5 (*) 0.8 - 2.0 ng/mL  GLUCOSE, CAPILLARY     Status: Abnormal   Collection Time    03/11/13  6:02 PM      Result Value Range   Glucose-Capillary 16 (*) 70 - 99 mg/dL  GLUCOSE, CAPILLARY     Status: Abnormal   Collection Time    03/11/13  6:13 PM      Result Value Range   Glucose-Capillary 257 (*) 70 - 99 mg/dL   Comment 1 Documented in Chart     Comment 2 Notify RN    GLUCOSE, CAPILLARY     Status: None   Collection Time    03/11/13  6:50 PM      Result Value Range   Glucose-Capillary 71  70 - 99 mg/dL   Dg Chest 2 View  03/11/2013   *RADIOLOGY REPORT*  Clinical Data: The patient states he passed out.  Low blood sugar.  CHEST - 2 VIEW  Comparison: Chest radiograph 08/23/2012  Findings: Stable cardiomegaly.  Stable support apparatus.  Mild pulmonary venous  congestion without overt pulmonary edema.  No large consolidative pulmonary opacity.  No pleural effusion or pneumothorax.  IMPRESSION: Cardiomegaly, mild pulmonary venous hypertension.  No overt pulmonary edema.   Original Report Authenticated By: Lovey Newcomer, M.D    @ROS @ Constitutional: Negative for fever and chills.  Respiratory: Positive for shortness of breath. Negative for hemoptysis and sputum production.  Cardiovascular: Positive for orthopnea, leg swelling and PND. Negative for chest pain, palpitations and claudication.  Gastrointestinal: Negative for nausea, vomiting and abdominal pain.  Genitourinary: Negative for dysuria.  Neurological: Positive for  dizziness. Negative for headaches.   Blood pressure 176/90, pulse 63, temperature 97.5 F (36.4 C), temperature source Oral, resp. rate 22, SpO2 98.00%.  Physical Exam  Constitutional: He is oriented to person, place, and time. He appears well-developed and well-nourished.  HENT: Head: Normocephalic and atraumatic. Eyes: Owens Shark, conjunctivae are normal. Pupils are equal, round, and reactive to light. No scleral icterus.  Neck: Supple. No JVD present. No tracheal deviation present. No thyromegaly present.  Cardiovascular: Normal rate and regular rhythm. Sift systolic murmur noted.  Respiratory: He has no wheezes.   GI: Soft. Bowel sounds are normal. He exhibits mild distension. There is no tenderness. There is no rebound and no guarding.  Musculoskeletal: No clubbing or cyanosis. 2+ edema noted  Lymphadenopathy: He has no cervical adenopathy.  Neurological: He is alert and oriented to person, place, and time. Moves all 4 extremity.  Assessment/Plan Acute on chronic kidney failure  Hypoglycemic attacks from medication use Glipizide adverse effect Nonischemic dilated cardiomyopathy status post ICD  Hypertension  Non-insulin-dependent diabetes mellitus  Hypercholesteremia  History of gouty arthritis  S/P of tobacco abuse  History of ETOH abuse in past  Admit/Hold glipizide/IV D 50 as needed./Renal consult.  Karlye Ihrig S 03/11/2013, 7:43 PM

## 2013-03-12 ENCOUNTER — Encounter (HOSPITAL_COMMUNITY): Payer: Self-pay | Admitting: Nephrology

## 2013-03-12 LAB — GLUCOSE, CAPILLARY
Glucose-Capillary: 46 mg/dL — ABNORMAL LOW (ref 70–99)
Glucose-Capillary: 137 mg/dL — ABNORMAL HIGH (ref 70–99)
Glucose-Capillary: 56 mg/dL — ABNORMAL LOW (ref 70–99)
Glucose-Capillary: 68 mg/dL — ABNORMAL LOW (ref 70–99)
Glucose-Capillary: 53 mg/dL — ABNORMAL LOW (ref 70–99)
Glucose-Capillary: 41 mg/dL — CL (ref 70–99)
Glucose-Capillary: 179 mg/dL — ABNORMAL HIGH (ref 70–99)
Glucose-Capillary: 51 mg/dL — ABNORMAL LOW (ref 70–99)
Glucose-Capillary: 55 mg/dL — ABNORMAL LOW (ref 70–99)
Glucose-Capillary: 66 mg/dL — ABNORMAL LOW (ref 70–99)
Glucose-Capillary: 110 mg/dL — ABNORMAL HIGH (ref 70–99)
Glucose-Capillary: 118 mg/dL — ABNORMAL HIGH (ref 70–99)
Glucose-Capillary: 66 mg/dL — ABNORMAL LOW (ref 70–99)
Glucose-Capillary: 109 mg/dL — ABNORMAL HIGH (ref 70–99)
Glucose-Capillary: 97 mg/dL (ref 70–99)

## 2013-03-12 LAB — CBC
HCT: 31.1 % — ABNORMAL LOW (ref 39.0–52.0)
MCHC: 33.1 g/dL (ref 30.0–36.0)
MCV: 94.8 fL (ref 78.0–100.0)
RDW: 15.8 % — ABNORMAL HIGH (ref 11.5–15.5)

## 2013-03-12 LAB — COMPREHENSIVE METABOLIC PANEL
ALT: 20 U/L (ref 0–53)
Albumin: 2.8 g/dL — ABNORMAL LOW (ref 3.5–5.2)
Alkaline Phosphatase: 60 U/L (ref 39–117)
Potassium: 4.6 mEq/L (ref 3.5–5.1)
Sodium: 131 mEq/L — ABNORMAL LOW (ref 135–145)
Total Protein: 5.8 g/dL — ABNORMAL LOW (ref 6.0–8.3)

## 2013-03-12 LAB — GLUCOSE, RANDOM: Glucose, Bld: 39 mg/dL — CL (ref 70–99)

## 2013-03-12 MED ORDER — DEXTROSE 50 % IV SOLN
INTRAVENOUS | Status: AC
  Administered 2013-03-12: 50 mL
  Filled 2013-03-12: qty 50

## 2013-03-12 MED ORDER — FUROSEMIDE 10 MG/ML IJ SOLN
160.0000 mg | Freq: Three times a day (TID) | INTRAVENOUS | Status: DC
  Administered 2013-03-12 – 2013-03-13 (×3): 160 mg via INTRAVENOUS
  Filled 2013-03-12 (×4): qty 16

## 2013-03-12 MED ORDER — FUROSEMIDE 10 MG/ML IJ SOLN
80.0000 mg | Freq: Two times a day (BID) | INTRAMUSCULAR | Status: DC
  Administered 2013-03-12: 80 mg via INTRAVENOUS
  Filled 2013-03-12 (×2): qty 8

## 2013-03-12 MED ORDER — GLUCOSE 40 % PO GEL
ORAL | Status: AC
  Administered 2013-03-12: 37.5 g
  Filled 2013-03-12: qty 1

## 2013-03-12 MED ORDER — DEXTROSE-NACL 5-0.45 % IV SOLN
INTRAVENOUS | Status: DC
  Administered 2013-03-12: 07:00:00 via INTRAVENOUS

## 2013-03-12 MED ORDER — DEXTROSE 10 % IV SOLN
INTRAVENOUS | Status: DC
  Administered 2013-03-12: 10:00:00 via INTRAVENOUS

## 2013-03-12 MED ORDER — DEXTROSE 50 % IV SOLN
1.0000 | Freq: Once | INTRAVENOUS | Status: AC
  Administered 2013-03-12: 50 mL via INTRAVENOUS

## 2013-03-12 MED ORDER — DEXTROSE 10 % IV SOLN
INTRAVENOUS | Status: DC
  Administered 2013-03-12 – 2013-03-13 (×4): via INTRAVENOUS

## 2013-03-12 MED ORDER — GLUCOSE 40 % PO GEL
1.0000 | ORAL | Status: DC | PRN
  Administered 2013-03-12 (×4): 37.5 g via ORAL
  Filled 2013-03-12 (×2): qty 1
  Filled 2013-03-12: qty 3
  Filled 2013-03-12: qty 1

## 2013-03-12 MED ORDER — FUROSEMIDE 10 MG/ML IJ SOLN
120.0000 mg | Freq: Three times a day (TID) | INTRAVENOUS | Status: DC
  Administered 2013-03-12: 120 mg via INTRAVENOUS
  Filled 2013-03-12 (×3): qty 12

## 2013-03-12 MED ORDER — GLUCAGON HCL (RDNA) 1 MG IJ SOLR
INTRAMUSCULAR | Status: AC
  Administered 2013-03-12: 1 mg
  Filled 2013-03-12: qty 1

## 2013-03-12 NOTE — Progress Notes (Signed)
Report called to [place], Therapist, sports. Dorthey Sawyer

## 2013-03-12 NOTE — Progress Notes (Signed)
Hypoglycemic Event  CBG: 34  Treatment: 15 GM carbohydrate snack  Symptoms: None  Follow-up CBG: Time:2238 CBG Result:52  Possible Reasons for Event: Medication regimen: antidiabetic med  Comments/MD notified:    Neomia Glass  Remember to initiate Hypoglycemia Order Set & complete

## 2013-03-12 NOTE — Progress Notes (Signed)
Blood sugar 137, patient more alert, speech clear, sitting up talking and eating breakfast.

## 2013-03-12 NOTE — Progress Notes (Signed)
Bladder scan showed 20 ml, bloos sugar now 80.  Dr Doylene Canard at bedside. New order received.

## 2013-03-12 NOTE — Progress Notes (Signed)
Dr. Doylene Canard into see patient, New order received.

## 2013-03-12 NOTE — Progress Notes (Signed)
Call placed to Dr. Doylene Canard , patient blood sugar is 55, IV team attempted to get second line x 3 unsuccessful.  Awaiting call back.

## 2013-03-12 NOTE — Progress Notes (Signed)
Blood sugar down to 95,

## 2013-03-12 NOTE — Progress Notes (Signed)
CBG: 66  Treatment: D50 IV 50 mL  Symptoms: None  Follow-up CBG: Time:2012 CBG Result:118  Possible Reasons for Event: Unknown  Comments/MD notified:Attempt to call report to [phone] awaiting call back from receiving nurse    Laurance Flatten, Roberts Gaudy

## 2013-03-12 NOTE — Progress Notes (Signed)
House supervisor notified of patient condition, every one hour blood sugars checks and blood sugar below 70 consistently despite treatment.  Informed,  I feel the level of care for this patient has change and he is requiring a higher level of care.  Instructed to call rapid response nurse and have her call Dr. Doylene Canard to evaluate if patient need to transfer to higher level of care.

## 2013-03-12 NOTE — Progress Notes (Signed)
Hypoglycemic Event  CBG: 60  Treatment: 15 GM gel  Symptoms: None  Follow-up CBG: Time:2350 CBG Result:56  Possible Reasons for Event: Inadequate meal intake  Comments/MD notified:Dr Bradd Burner O  Remember to initiate Hypoglycemia Order Set & complete

## 2013-03-12 NOTE — Progress Notes (Signed)
Hypoglycemic Event  CBG: 38  Treatment: D50 IV 50 mL  Symptoms: Nervous/irritable  Follow-up CBG: Time:0610 CBG Result:52  Possible Reasons for Event: Unknown  Comments/MD notified:Dr Bradd Burner O  Remember to initiate Hypoglycemia Order Set & complete

## 2013-03-12 NOTE — Progress Notes (Signed)
Still having problems with hypoglycemia, unclear cause. Does not appear septic. Have increased IV lasix to max, as risk for CHF with volume he is getting from D10. Foley could not be placed, pain on insertion. Bladder scan shows low reading however so bladder outlet obstruction is unlikely. Get renal US in am, have to make sure obstruction is not present.    [name]  MD Pager [phone]    Cell  [phone] 03/12/2013, 7:21 PM

## 2013-03-12 NOTE — Progress Notes (Signed)
Rapid response nurse notified patient blood sugar 53, started on  D 10 per MD order, will continue to monitor.

## 2013-03-12 NOTE — Consult Note (Signed)
Timothy Faulkner 03/12/2013 Timothy Faulkner D Requesting Physician:  Dr Army Chaco  Reason for Consult:  CKD pt admitted with refractory hypoglycemia HPI: The patient is a 67 y.o. year-old with hx of DM2, HTN, ICD , NICM, and CKD stage IV followed at [place] by Dr Posey Pronto.  Pt was seen at [facility] earlier this week with creat ~7 doing well without uremic symptoms.  Then pt became confused at home, EMS came and BS was 35. Pt was taking glipizide.  Pt was admitted but BS's have remained low overnight and today. He is getting IV D10 at 75 cc/hr, glipized is on hold. Interestingly, despite low accuchecks from L hand, all below 50, the serum glucose drawn this am was 95.  And, one accucheck done from access arm hand (R hand) was 135 per pt.    He denies any N/V, diarrhea, slight decrease in appetite, no SOB, occ orthopnea.  +leg swelling bilat.  No nsaid's. ACEI was recently stopped to try and improve GFR so that he could possibly complete the transplant process and get a transplant w/o having to do dialysis.     ROS  denies cough, cp  no rash or itching  Past Medical History  Past Medical History  Diagnosis Date  . HYPERTENSION   . HYPERCHOLESTEROLEMIA, MIXED   . CHF   . DIAB W/O COMP TYPE II/UNS NOT STATED UNCNTRL   . 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 Anemia   . ESRD (end stage renal disease)   . Acute on chronic renal failure     /notes 03/11/2013 (03/11/2013)  . Heart murmur   . Automatic implantable cardioverter-defibrillator in situ     [employer]   Past Surgical History  Past Surgical History  Procedure Laterality Date  . Incision and drainage abscess / hematoma of bursa / knee / thigh Left 2012    "knee" (03/11/2013)  . Cardiac defibrillator placement      [employer]  . Av fistula placement Right 12/13/2012    Procedure: ARTERIOVENOUS (AV) FISTULA CREATION;  Surgeon: [name], MD;  Location: [facility] OR;  Service: Vascular;  Laterality: Right;  Ultrasound guided   Family  History  Family History  Problem Relation Age of Onset  . Heart disease Father    Social History  reports that he quit smoking about 20 years ago. His smoking use included Cigarettes. He has a 22.5 pack-year smoking history. He has never used smokeless tobacco. He reports that  drinks alcohol. He reports that he does not use illicit drugs. Allergies No Known Allergies Home medications Prior to Admission medications   Medication Sig Start Date End Date Taking? Authorizing Provider  aspirin EC 81 MG EC tablet Take 1 tablet (81 mg total) by mouth daily. 08/26/12  Yes Clent Demark, MD  calcitRIOL (ROCALTROL) 0.25 MCG capsule Take 0.25 mcg by mouth 3 (three) times a week. Monday, Wednesday and Friday   Yes Historical Provider, MD  carvedilol (COREG) 25 MG tablet Take 25 mg by mouth 2 (two) times daily with a meal.     Yes Historical Provider, MD  colchicine 0.6 MG tablet Take 0.6 mg by mouth daily as needed.   Yes Historical Provider, MD  digoxin (LANOXIN) 0.125 MG tablet Take 1 tablet (0.125 mg total) by mouth daily. 08/26/12  Yes Clent Demark, MD  ezetimibe-simvastatin (VYTORIN) 10-40 MG per tablet Take 1 tablet by mouth at bedtime.     Yes Historical Provider, MD  furosemide (LASIX) 40 MG tablet  Take 40 mg by mouth 2 (two) times daily.   Yes Historical Provider, MD  glipiZIDE (GLUCOTROL XL) 5 MG 24 hr tablet Take 5 mg by mouth daily.    Yes Historical Provider, MD  isosorbide-hydrALAZINE (BIDIL) 20-37.5 MG per tablet Take 1 tablet by mouth 2 (two) times daily.     Yes Historical Provider, MD  Vitamin D, Ergocalciferol, (DRISDOL) 50000 UNITS CAPS Take 50,000 Units by mouth every 7 (seven) days. mondays   Yes Historical Provider, MD   Liver Function Tests  Recent Labs Lab 03/09/13 1051 03/11/13 1456 03/12/13 0553  AST  --  19 16  ALT  --  24 20  ALKPHOS  --  74 60  BILITOT  --  0.4 0.5  PROT  --  6.6 5.8*  ALBUMIN 2.7* 3.1* 2.8*   No results found for this basename: LIPASE,  AMYLASE,  in the last 168 hours CBC  Recent Labs Lab 03/09/13 1053 03/11/13 1456 03/12/13 0553  WBC  --  4.5 6.2  NEUTROABS  --  3.3  --   HGB 10.5* 11.7* 10.3*  HCT  --  35.7* 31.1*  MCV  --  95.7 94.8  PLT  --  122* [id number]*   Basic Metabolic Panel  Recent Labs Lab 03/09/13 1051 03/11/13 1456 03/12/13 0553  NA 136 132* 131*  K 4.4 4.9 4.6  CL 106 103 99  CO2 19 17* 17*  GLUCOSE 92 32* 95  BUN 76* 79* 79*  CREATININE 7.28* 7.08* 7.25*  CALCIUM 8.5 8.6 8.2*  PHOS 4.9*  --   --     Physical Exam  Blood pressure 132/60, pulse 68, temperature 97.9 F (36.6 C), temperature source Oral, resp. rate 22, height 5\' 8"  (1.727 m), weight 103.5 kg (228 lb 2.8 oz), SpO2 100.00%. Gen: obese pleasant AAM no distress Skin: no rash, cyanosis HEENT:  EOMI, sclera anicteric, throat clear Neck: no JVD, no LAN Chest: some exp wheezes, not severe, no rales or bronchial BS CV: regular, no murmur or rub Abdomen: soft, very obese, distended, liver down 4cm Ext: 2+ pitting edema pretibial and dependent areas, no joint effusion, no gangrene/ulcers Neuro: alert, Ox3, nonfocal, no asterixis   Impression 1. CKD stage V- not overtly uremic, hold on HD for now 2. Volume overload- diurese with IV lasix 3. Hypoglycemia- ?false low readings from L hand- check serum glucose, he is not symptomatic.  OHG on hold 4. DM2- 1yrs, never took insulin 5. HTN- 7 years, on coreg, bidil and lasix 6. NICM s/p ICD 2011 7. Morbid obesity 8. S/P RUA AVF Dec 13, 2012   Plan-  IV lasix, serum glucose  Timothy Splinter  MD Pager [phone]    Cell  [phone] 03/12/2013, 11:09 AM

## 2013-03-12 NOTE — Progress Notes (Signed)
Call placed to Dr. Doylene Canard, patient  serum glucose was 39, patient again appears restless, mild SOB noted, resp 28. Amp. Of D50 given, call also placed to rapid response nurse and charge nurse.

## 2013-03-12 NOTE — Progress Notes (Signed)
Subjective:  Recurrent low blood sugars. Sitting up and eating food now. Some what more edematous  Objective:  Vital Signs in the last 24 hours: Temp:  [97.6 F (36.4 C)-98 F (36.7 C)] 98 F (36.7 C) (08/02 1000) Pulse Rate:  [63-70] 68 (08/02 1400) Cardiac Rhythm:  [-] Normal sinus rhythm (08/02 1100) Resp:  [14-24] 24 (08/02 1400) BP: (114-176)/(60-113) 128/66 mmHg (08/02 1400) SpO2:  [96 %-100 %] 100 % (08/02 1000) Weight:  [103.5 kg (228 lb 2.8 oz)] 103.5 kg (228 lb 2.8 oz) (08/01 2109)  Physical Exam: BP Readings from Last 1 Encounters:  03/12/13 128/66     Wt Readings from Last 1 Encounters:  03/11/13 103.5 kg (228 lb 2.8 oz)    Weight change:   HEENT: [place]/AT, Moon face, Eyes-Brown, PERL, EOMI, Conjunctiva-Pale pink, Sclera-Non-icteric Neck: No JVD, No bruit, Trachea midline. Lungs:  Clear, Bilateral. Cardiac:  Regular rhythm, normal S1 and S2, no S3.  Abdomen:  Soft, non-tender. Extremities:  2 + edema present. No cyanosis. No clubbing. CNS: AxOx3, Cranial nerves grossly intact, moves all 4 extremities. Right handed. Skin: Warm and dry.   Intake/Output from previous day: 08/01 0701 - 08/02 0700 In: 240 [P.O.:240] Out: -     Lab Results: BMET    Component Value Date/Time   NA 131* 03/12/2013 0553   K 4.6 03/12/2013 0553   CL 99 03/12/2013 0553   CO2 17* 03/12/2013 0553   GLUCOSE 39* 03/12/2013 1147   BUN 79* 03/12/2013 0553   CREATININE 7.25* 03/12/2013 0553   CALCIUM 8.2* 03/12/2013 0553   GFRNONAA 7* 03/12/2013 0553   GFRAA 8* 03/12/2013 0553   CBC    Component Value Date/Time   WBC 6.2 03/12/2013 0553   RBC 3.28* 03/12/2013 0553   HGB 10.3* 03/12/2013 0553   HCT 31.1* 03/12/2013 0553   PLT 108* 03/12/2013 0553   MCV 94.8 03/12/2013 0553   MCH 31.4 03/12/2013 0553   MCHC 33.1 03/12/2013 0553   RDW 15.8* 03/12/2013 0553   LYMPHSABS 0.6* 03/11/2013 1456   MONOABS 0.6 03/11/2013 1456   EOSABS 0.0 03/11/2013 1456   BASOSABS 0.0 03/11/2013 1456   CARDIAC ENZYMES Lab Results  Component  Value Date   CKTOTAL 145 08/25/2012   CKMB 5.0* 08/25/2012   TROPONINI 0.51* 08/25/2012    Scheduled Meds: . aspirin EC  81 mg Oral Daily  . calcitRIOL  0.25 mcg Oral 3 times weekly  . dextrose      . docusate sodium  100 mg Oral BID  . furosemide  120 mg Intravenous Q8H  . furosemide  80 mg Intravenous Q12H  . heparin  5,000 Units Subcutaneous Q8H  . insulin aspart  0-9 Units Subcutaneous TID WC  . isosorbide-hydrALAZINE  1 tablet Oral BID  . multivitamin with minerals  1 tablet Oral Daily  . sodium chloride  3 mL Intravenous Q12H   Continuous Infusions: . dextrose    . dextrose 5 % and 0.45% NaCl 50 mL/hr at 03/12/13 0645   PRN Meds:.dextrose, ondansetron (ZOFRAN) IV, ondansetron  Assessment/Plan: Acute on chronic kidney failure  Hypoglycemic attacks from medication use  Glipizide adverse effect  Nonischemic dilated cardiomyopathy status post ICD  Hypertension  Non-insulin-dependent diabetes mellitus  Hypercholesteremia  History of gouty arthritis  S/P of tobacco abuse  History of ETOH abuse in past  Transfer to Step down for further management. Try glucagon again.post meal if blood sugar drops again. Change sugar checks to every hour.   LOS: 1  day    Dixie Dials  MD  03/12/2013, 5:52 PM

## 2013-03-12 NOTE — Progress Notes (Signed)
Blood sugar 56, one amp. D50 given per rapid response.

## 2013-03-12 NOTE — Progress Notes (Signed)
Hypoglycemic Event  CBG: 56  Treatment: D50 IV 50 mL  Symptoms: Nervous/irritable  Follow-up CBG: Time:0150 CBG Result:55  Possible Reasons for Event: Inadequate meal intake  Comments/MD notified:Dr Bradd Burner O  Remember to initiate Hypoglycemia Order Set & complete

## 2013-03-12 NOTE — Progress Notes (Signed)
Call placed to Dr. Doylene Canard, patient blood sugar is now 29, new orders received.  D 10 at 75 started per MD order.

## 2013-03-12 NOTE — Progress Notes (Signed)
CBG: 62  Treatment: D50 IV 50 mL, oral glucose Symptoms: None  Follow-up CBG: Time:2109  CBG Result:109  Possible Reasons for Event: Unknown

## 2013-03-12 NOTE — Progress Notes (Signed)
Still awaiting call from Dr. Doylene Canard

## 2013-03-12 NOTE — Progress Notes (Signed)
Dr. Doylene Canard return call, informed of patients blood sugar, new orders received.

## 2013-03-12 NOTE — Progress Notes (Signed)
Rapid response nurse and Dr. Doylene Canard into see patient.

## 2013-03-12 NOTE — Progress Notes (Signed)
Rapid response into see patient,  Glucagon IM given per rapid response.

## 2013-03-12 NOTE — Progress Notes (Signed)
Subjective:  Responding well to IV Glucagon. Needed several rounds of D 50 overnight, in spite of reasonable oral intake.  Objective:  Vital Signs in the last 24 hours: Temp:  [97.5 F (36.4 C)-98 F (36.7 C)] 97.9 F (36.6 C) (08/02 0525) Pulse Rate:  [61-70] 68 (08/02 0525) Cardiac Rhythm:  [-] Normal sinus rhythm (08/01 1412) Resp:  [14-32] 22 (08/02 0525) BP: (114-176)/(60-113) 132/60 mmHg (08/02 0525) SpO2:  [96 %-100 %] 100 % (08/02 0525) Weight:  [103.5 kg (228 lb 2.8 oz)] 103.5 kg (228 lb 2.8 oz) (08/01 2109)  Physical Exam: BP Readings from Last 1 Encounters:  03/12/13 132/60     Wt Readings from Last 1 Encounters:  03/11/13 103.5 kg (228 lb 2.8 oz)    Weight change:   HEENT: [place]/AT, Eyes-Brown, PERL, EOMI, Conjunctiva-Pale pink, Sclera-Non-icteric Neck: No JVD, No bruit, Trachea midline. Lungs:  Clear, Bilateral. Cardiac:  Regular rhythm, normal S1 and S2, no S3.  Abdomen:  Soft, non-tender. Extremities:  2 + edema present. No cyanosis. No clubbing. CNS: AxOx3, Cranial nerves grossly intact, moves all 4 extremities. Right handed. Skin: Warm and dry.   Intake/Output from previous day: 08/01 0701 - 08/02 0700 In: 240 [P.O.:240] Out: -     Lab Results: BMET    Component Value Date/Time   NA 131* 03/12/2013 0553   K 4.6 03/12/2013 0553   CL 99 03/12/2013 0553   CO2 17* 03/12/2013 0553   GLUCOSE 95 03/12/2013 0553   BUN 79* 03/12/2013 0553   CREATININE 7.25* 03/12/2013 0553   CALCIUM 8.2* 03/12/2013 0553   GFRNONAA 7* 03/12/2013 0553   GFRAA 8* 03/12/2013 0553   CBC    Component Value Date/Time   WBC 6.2 03/12/2013 0553   RBC 3.28* 03/12/2013 0553   HGB 10.3* 03/12/2013 0553   HCT 31.1* 03/12/2013 0553   PLT 108* 03/12/2013 0553   MCV 94.8 03/12/2013 0553   MCH 31.4 03/12/2013 0553   MCHC 33.1 03/12/2013 0553   RDW 15.8* 03/12/2013 0553   LYMPHSABS 0.6* 03/11/2013 1456   MONOABS 0.6 03/11/2013 1456   EOSABS 0.0 03/11/2013 1456   BASOSABS 0.0 03/11/2013 1456   CARDIAC ENZYMES Lab  Results  Component Value Date   CKTOTAL 145 08/25/2012   CKMB 5.0* 08/25/2012   TROPONINI 0.51* 08/25/2012    Scheduled Meds: . aspirin EC  81 mg Oral Daily  . calcitRIOL  0.25 mcg Oral 3 times weekly  . docusate sodium  100 mg Oral BID  . furosemide  40 mg Intravenous Q12H  . heparin  5,000 Units Subcutaneous Q8H  . insulin aspart  0-9 Units Subcutaneous TID WC  . isosorbide-hydrALAZINE  1 tablet Oral BID  . multivitamin with minerals  1 tablet Oral Daily  . sodium chloride  3 mL Intravenous Q12H   Continuous Infusions: . dextrose 5 % and 0.45% NaCl 50 mL/hr at 03/12/13 0645   PRN Meds:.dextrose, ondansetron (ZOFRAN) IV, ondansetron  Assessment/Plan: Acute on chronic kidney failure  Hypoglycemic attacks from medication use  Glipizide adverse effect  Nonischemic dilated cardiomyopathy status post ICD  Hypertension  Non-insulin-dependent diabetes mellitus  Hypercholesteremia  History of gouty arthritis  S/P of tobacco abuse  History of ETOH abuse in past  Continue dextrose supplement as needed.   LOS: 1 day    Dixie Dials  MD  03/12/2013, 8:30 AM

## 2013-03-12 NOTE — Progress Notes (Signed)
Rapid response nurse notified patient blood sugar 43,  Stat serum glucose order. Dr. Jonnie Finner into see patient.

## 2013-03-12 NOTE — ED Provider Notes (Signed)
Medical screening examination/treatment/procedure(s) were performed by non-physician practitioner and as supervising physician I was immediately available for consultation/collaboration.    Dot Lanes, MD 03/12/13 1115

## 2013-03-12 NOTE — Progress Notes (Signed)
Rapid response call for a sustained blood sugar below 70.  Blood sugar 31 despite treatment.  Patient restless, confused, BP 113/66, 65, 28.

## 2013-03-12 NOTE — Progress Notes (Signed)
Urinary foley attempted x 2 by Seymour Bars, RN and Cathie, RN unsuccessful.  Second attempt small amount of blood noted.  Patient with great amount of pain during procedure.  Call placed to Dr. Doylene Canard, awaiting call back.

## 2013-03-13 ENCOUNTER — Inpatient Hospital Stay (HOSPITAL_COMMUNITY): Payer: Medicare Other

## 2013-03-13 LAB — GLUCOSE, CAPILLARY: Glucose-Capillary: 98 mg/dL (ref 70–99)

## 2013-03-13 LAB — RENAL FUNCTION PANEL
BUN: 83 mg/dL — ABNORMAL HIGH (ref 6–23)
Chloride: 95 mEq/L — ABNORMAL LOW (ref 96–112)
Creatinine, Ser: 7.31 mg/dL — ABNORMAL HIGH (ref 0.50–1.35)
Glucose, Bld: 93 mg/dL (ref 70–99)
Potassium: 5.3 mEq/L — ABNORMAL HIGH (ref 3.5–5.1)

## 2013-03-13 LAB — MRSA PCR SCREENING: MRSA by PCR: NEGATIVE

## 2013-03-13 LAB — HEPATITIS B SURFACE ANTIBODY,QUALITATIVE: Hep B S Ab: NEGATIVE

## 2013-03-13 LAB — HEPATITIS B SURFACE ANTIGEN: Hepatitis B Surface Ag: NEGATIVE

## 2013-03-13 MED ORDER — LIDOCAINE HCL (PF) 1 % IJ SOLN: 5.0000 mL | INTRAMUSCULAR | Status: DC | PRN

## 2013-03-13 MED ORDER — SODIUM CHLORIDE 0.9 % IV SOLN: 100.0000 mL | INTRAVENOUS | Status: DC | PRN

## 2013-03-13 MED ORDER — LIDOCAINE-PRILOCAINE 2.5-2.5 % EX CREA
1.0000 "application " | TOPICAL_CREAM | CUTANEOUS | Status: DC | PRN
  Filled 2013-03-13: qty 5

## 2013-03-13 MED ORDER — PENTAFLUOROPROP-TETRAFLUOROETH EX AERO: 1.0000 "application " | INHALATION_SPRAY | CUTANEOUS | Status: DC | PRN

## 2013-03-13 MED ORDER — ALTEPLASE 2 MG IJ SOLR
2.0000 mg | Freq: Once | INTRAMUSCULAR | Status: AC | PRN
  Filled 2013-03-13: qty 2

## 2013-03-13 MED ORDER — PANTOPRAZOLE SODIUM 40 MG PO TBEC
40.0000 mg | DELAYED_RELEASE_TABLET | Freq: Every day | ORAL | Status: DC
  Administered 2013-03-13 – 2013-03-17 (×5): 40 mg via ORAL
  Filled 2013-03-13 (×5): qty 1

## 2013-03-13 MED ORDER — HEPARIN SODIUM (PORCINE) 1000 UNIT/ML DIALYSIS
1000.0000 [IU] | INTRAMUSCULAR | Status: DC | PRN
  Filled 2013-03-13: qty 1

## 2013-03-13 MED ORDER — NEPRO/CARBSTEADY PO LIQD
237.0000 mL | ORAL | Status: DC | PRN
  Filled 2013-03-13: qty 237

## 2013-03-13 NOTE — Progress Notes (Signed)
  Echocardiogram 2D Echocardiogram has been performed.  Timothy Faulkner 03/13/2013, 10:52 AM

## 2013-03-13 NOTE — Procedures (Signed)
Under sterile conditions and US guidance, a 24-cm 3-lumen Trialysis HD catheter was placed in the R femoral vein without incident.  REady to use.  [name]  MD Pager [phone]    Cell  [phone] 03/13/2013, 4:18 PM

## 2013-03-13 NOTE — Procedures (Signed)
I was present at this dialysis session. I have reviewed the session itself and made appropriate changes.   [name]  MD Pager [phone]    Cell  [phone] 03/13/2013, 4:17 PM

## 2013-03-13 NOTE — Progress Notes (Signed)
Subjective:  Increasing shortness of breath and generalized edema, ascites. Few episodes of hypoglycemia continues. No significant urine out put in 2 days.  Objective:  Vital Signs in the last 24 hours: Temp:  [97.2 F (36.2 C)-98.2 F (36.8 C)] 97.7 F (36.5 C) (08/03 0735) Pulse Rate:  [67-72] 68 (08/03 0735) Cardiac Rhythm:  [-] Normal sinus rhythm (08/02 1100) Resp:  [16-24] 19 (08/03 0735) BP: (117-146)/(66-97) 146/82 mmHg (08/03 0735) SpO2:  [99 %-100 %] 99 % (08/03 0735) Weight:  [111 kg (244 lb 11.4 oz)] 111 kg (244 lb 11.4 oz) (08/03 0348)  Physical Exam: BP Readings from Last 1 Encounters:  03/13/13 146/82     Wt Readings from Last 1 Encounters:  03/13/13 111 kg (244 lb 11.4 oz)    Weight change: 7.5 kg (16 lb 8.6 oz)  HEENT: [place]/AT, Eyes-Brown, PERL, EOMI, Conjunctiva-Pale piink, Sclera-Non-icteric Neck: No JVD, No bruit, Trachea midline. Lungs:  Basal crackles, Bilateral. Cardiac:  Regular rhythm, normal S1 and S2, no S3.  Abdomen:  Soft, non-tender. Distended, dull to percussion on sides. Extremities:  2 + edema present. No cyanosis. No clubbing. CNS: AxOx3, Cranial nerves grossly intact, moves all 4 extremities. Right handed. Skin: Warm and dry.   Intake/Output from previous day: 08/02 0701 - 08/03 0700 In: 2678 [P.O.:1560; I.V.:1118] Out: 222 [Urine:221; Stool:1]    Lab Results: BMET    Component Value Date/Time   NA 124* 03/13/2013 0612   K 5.3* 03/13/2013 0612   CL 95* 03/13/2013 0612   CO2 17* 03/13/2013 0612   GLUCOSE 93 03/13/2013 0612   BUN 83* 03/13/2013 0612   CREATININE 7.31* 03/13/2013 0612   CALCIUM 8.3* 03/13/2013 0612   GFRNONAA 7* 03/13/2013 0612   GFRAA 8* 03/13/2013 0612   CBC    Component Value Date/Time   WBC 6.2 03/12/2013 0553   RBC 3.28* 03/12/2013 0553   HGB 10.3* 03/12/2013 0553   HCT 31.1* 03/12/2013 0553   PLT 108* 03/12/2013 0553   MCV 94.8 03/12/2013 0553   MCH 31.4 03/12/2013 0553   MCHC 33.1 03/12/2013 0553   RDW 15.8* 03/12/2013 0553   LYMPHSABS 0.6* 03/11/2013 1456   MONOABS 0.6 03/11/2013 1456   EOSABS 0.0 03/11/2013 1456   BASOSABS 0.0 03/11/2013 1456   CARDIAC ENZYMES Lab Results  Component Value Date   CKTOTAL 145 08/25/2012   CKMB 5.0* 08/25/2012   TROPONINI 0.51* 08/25/2012    Scheduled Meds: . aspirin EC  81 mg Oral Daily  . calcitRIOL  0.25 mcg Oral 3 times weekly  . docusate sodium  100 mg Oral BID  . furosemide  160 mg Intravenous Q8H  . heparin  5,000 Units Subcutaneous Q8H  . insulin aspart  0-9 Units Subcutaneous TID WC  . isosorbide-hydrALAZINE  1 tablet Oral BID  . multivitamin with minerals  1 tablet Oral Daily  . sodium chloride  3 mL Intravenous Q12H   Continuous Infusions: . dextrose 100 mL/hr at 03/13/13 1017   PRN Meds:.dextrose, ondansetron (ZOFRAN) IV, ondansetron  Assessment/Plan: Acute on chronic kidney failure  Hypoglycemic attacks from medication use  Glipizide adverse effect  Nonischemic dilated cardiomyopathy status post ICD  Hypertension  Non-insulin-dependent diabetes mellitus  Hypercholesteremia  History of gouty arthritis  S/P of tobacco abuse  History of ETOH abuse in past  Awaiting hemodialysis for fluid management and hyperkalemia and hypoglycemia.    LOS: 2 days    Dixie Dials  MD  03/13/2013, 10:26 AM

## 2013-03-13 NOTE — Progress Notes (Addendum)
Subjective: Mild SOB, making some urine about 450 today per pt, not recorded, 220 recorded yest  Physical Exam:  Blood pressure 136/65, pulse 70, temperature 98 F (36.7 C), temperature source Oral, resp. rate 18, height 5\' 8"  (1.727 m), weight 111 kg (244 lb 11.4 oz), SpO2 95.00%. Gen: obese AAM, ?mild asterixis, less alert today but responds appropriately Skin: no rash, cyanosis  HEENT: EOMI, sclera anicteric, throat clear  Neck: no JVD, no LAN  Chest: scattered exp wheeze, occ crackles at bases  CV: regular, no murmur or rub  Abdomen: soft, very obese, distended, liver down 4cm  Ext: 2+ pitting edema pretibial and dependent areas, no joint effusion, no gangrene/ulcers  Neuro: alert, Ox3, nonfocal, no asterixis   Impression/Plan:  1. CKD stage V- up 8kg now w resp compromise and not responding to high dose IV lasix, needs dialysis, this is permanent ESRD. Plan acute HD today w temp cath.  AVF not mature. Have consulted IR for tunneled cath as well when stable enough. Will need CLIP when stable. 2. Hypoglycemia- prolonged episode, off glipizide, finally starting to stabilize 3. DM2- 36yrs, never took insulin 4. HTN- 7 years, on coreg, bidil and lasix 5. NICM s/p ICD 2011 6. Morbid obesity 7. S/P RUA AVF Dec 13, 2012  [name]  MD Pager [phone]    Cell  [phone] 03/13/2013, 12:45 PM   Recent Labs Lab 03/09/13 1051 03/11/13 1456 03/12/13 0553 03/12/13 1147 03/13/13 0612  NA 136 132* 131*  --  124*  K 4.4 4.9 4.6  --  5.3*  CL 106 103 99  --  95*  CO2 19 17* 17*  --  17*  GLUCOSE 92 32* 95 39* 93  BUN 76* 79* 79*  --  83*  CREATININE 7.28* 7.08* 7.25*  --  7.31*  CALCIUM 8.5 8.6 8.2*  --  8.3*  PHOS 4.9*  --   --   --  3.9    Recent Labs Lab 03/09/13 1053 03/11/13 1456 03/12/13 0553  WBC  --  4.5 6.2  NEUTROABS  --  3.3  --   HGB 10.5* 11.7* 10.3*  HCT  --  35.7* 31.1*  MCV  --  95.7 94.8  PLT  --  122* 108*

## 2013-03-14 ENCOUNTER — Encounter (HOSPITAL_COMMUNITY): Payer: Self-pay | Admitting: Radiology

## 2013-03-14 LAB — RENAL FUNCTION PANEL
CO2: 20 mEq/L (ref 19–32)
Calcium: 8.4 mg/dL (ref 8.4–10.5)
Creatinine, Ser: 6 mg/dL — ABNORMAL HIGH (ref 0.50–1.35)
GFR calc Af Amer: 10 mL/min — ABNORMAL LOW (ref >90)
Glucose, Bld: 97 mg/dL (ref 70–99)
Sodium: 128 mEq/L — ABNORMAL LOW (ref 135–145)

## 2013-03-14 LAB — GLUCOSE, CAPILLARY
Glucose-Capillary: 99 mg/dL (ref 70–99)
Glucose-Capillary: 93 mg/dL (ref 70–99)
Glucose-Capillary: 158 mg/dL — ABNORMAL HIGH (ref 70–99)

## 2013-03-14 MED ORDER — HEPARIN SODIUM (PORCINE) 5000 UNIT/ML IJ SOLN
5000.0000 [IU] | Freq: Three times a day (TID) | INTRAMUSCULAR | Status: DC
  Filled 2013-03-14 (×2): qty 1

## 2013-03-14 MED ORDER — LIDOCAINE HCL (PF) 1 % IJ SOLN: 5.0000 mL | INTRAMUSCULAR | Status: DC | PRN

## 2013-03-14 MED ORDER — ALTEPLASE 2 MG IJ SOLR: 2.0000 mg | Freq: Once | INTRAMUSCULAR | Status: DC | PRN

## 2013-03-14 MED ORDER — HEPARIN SODIUM (PORCINE) 5000 UNIT/ML IJ SOLN
5000.0000 [IU] | Freq: Three times a day (TID) | INTRAMUSCULAR | Status: DC
  Administered 2013-03-16 – 2013-03-17 (×5): 5000 [IU] via SUBCUTANEOUS
  Filled 2013-03-14 (×7): qty 1

## 2013-03-14 MED ORDER — HEPARIN SODIUM (PORCINE) 1000 UNIT/ML DIALYSIS: 1000.0000 [IU] | INTRAMUSCULAR | Status: DC | PRN

## 2013-03-14 MED ORDER — NEPRO/CARBSTEADY PO LIQD: 237.0000 mL | ORAL | Status: DC | PRN

## 2013-03-14 MED ORDER — CEFAZOLIN SODIUM-DEXTROSE 2-3 GM-% IV SOLR
2.0000 g | Freq: Once | INTRAVENOUS | Status: AC
  Administered 2013-03-15: 2 g via INTRAVENOUS
  Filled 2013-03-14: qty 50

## 2013-03-14 MED ORDER — LIDOCAINE-PRILOCAINE 2.5-2.5 % EX CREA: 1.0000 "application " | TOPICAL_CREAM | CUTANEOUS | Status: DC | PRN

## 2013-03-14 MED ORDER — HEPARIN SODIUM (PORCINE) 5000 UNIT/ML IJ SOLN
5000.0000 [IU] | Freq: Three times a day (TID) | INTRAMUSCULAR | Status: DC
  Filled 2013-03-14 (×3): qty 1

## 2013-03-14 MED ORDER — SODIUM CHLORIDE 0.9 % IV SOLN: 100.0000 mL | INTRAVENOUS | Status: DC | PRN

## 2013-03-14 MED ORDER — PENTAFLUOROPROP-TETRAFLUOROETH EX AERO: 1.0000 "application " | INHALATION_SPRAY | CUTANEOUS | Status: DC | PRN

## 2013-03-14 NOTE — Progress Notes (Signed)
Nurse contacted HD nurse to inform that pt was experiencing oxygen desaturations, asked nurse to assess pt . Pt transported to HD during 0600 hour.

## 2013-03-14 NOTE — Progress Notes (Signed)
Subjective:  Rec HD 8/3 and 8/4.  Feels well today.  Good appeitite.  No SOB. Lying float.  Physical Exam:  Blood pressure 142/68, pulse 83, temperature 97.8 F (36.6 C), temperature source Oral, resp. rate 20, height 5\' 8"  (1.727 m), weight 105 kg (231 lb 7.7 oz), SpO2 99.00%.  NAD R Femoral Vascath bandaged R UE AVF with thrill, small RRR, nl s1s2. No rub CTAB. No crackles. No rashes 1-2+ LEE No asterixis AAOx3  A/P 1. CKD, volume overloaded and uremic, now on permanent HD; s/p HD yesterday and today; plan for permanent access / CVC tomorrow.  Will work towards finding outpt center.  2. 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 Ca and Phos acceptable.  Not on binder 4. Anemia: follow, will likely need to start ESA.  TSAT low.    Pearson Grippe MD 03/14/2013, 4:00 PM   Recent Labs Lab 03/09/13 1051  03/12/13 0553 03/12/13 1147 03/13/13 0612 03/14/13 0335  NA 136  < > 131*  --  124* 128*  K 4.4  < > 4.6  --  5.3* 5.1  CL 106  < > 99  --  95* 97  CO2 19  < > 17*  --  17* 20  GLUCOSE 92  < > 95 39* 93 97  BUN 76*  < > 79*  --  83* 61*  CREATININE 7.28*  < > 7.25*  --  7.31* 6.00*  CALCIUM 8.5  < > 8.2*  --  8.3* 8.4  PHOS 4.9*  --   --   --  3.9 4.3  < > = values in this interval not displayed.  Recent Labs Lab 03/09/13 1053 03/11/13 1456 03/12/13 0553  WBC  --  4.5 6.2  NEUTROABS  --  3.3  --   HGB 10.5* 11.7* 10.3*  HCT  --  35.7* 31.1*  MCV  --  95.7 94.8  PLT  --  122* 108*   Current Facility-Administered Medications  Medication Dose Route Frequency Provider Last Rate Last Dose  . aspirin EC tablet 81 mg  81 mg Oral Daily Birdie Riddle, MD   81 mg at 03/14/13 1031  . calcitRIOL (ROCALTROL) capsule 0.25 mcg  0.25 mcg Oral 3 times weekly Birdie Riddle, MD   0.25 mcg at 03/14/13 0900  . [START ON 03/15/2013] ceFAZolin (ANCEF) IVPB 2 g/50 mL premix  2 g Intravenous Once Lavonia Drafts, PA-C      . dextrose (GLUTOSE) 40 % oral gel 37.5 g   1 Tube Oral PRN Birdie Riddle, MD   37.5 g at 03/12/13 2051  . dextrose 10 % infusion   Intravenous Continuous Birdie Riddle, MD 20 mL/hr at 03/13/13 1328    . docusate sodium (COLACE) capsule 100 mg  100 mg Oral BID Birdie Riddle, MD   100 mg at 03/14/13 1031  . heparin injection 5,000 Units  5,000 Units Subcutaneous Q8H Lavonia Drafts, PA-C      . [START ON 03/16/2013] heparin injection 5,000 Units  5,000 Units Subcutaneous Q8H Birdie Riddle, MD      . insulin aspart (novoLOG) injection 0-9 Units  0-9 Units Subcutaneous TID WC Birdie Riddle, MD      . isosorbide-hydrALAZINE (BIDIL) 20-37.5 MG per tablet 1 tablet  1 tablet Oral BID Birdie Riddle, MD   1 tablet at 03/14/13 1033  . multivitamin with minerals tablet 1 tablet  1  tablet Oral Daily Birdie Riddle, MD   1 tablet at 03/14/13 1031  . ondansetron (ZOFRAN) tablet 4 mg  4 mg Oral Q6H PRN Birdie Riddle, MD   4 mg at 03/13/13 1326   Or  . ondansetron (ZOFRAN) injection 4 mg  4 mg Intravenous Q6H PRN Birdie Riddle, MD      . pantoprazole (PROTONIX) EC tablet 40 mg  40 mg Oral Daily Birdie Riddle, MD   40 mg at 03/14/13 1031  . sodium chloride 0.9 % injection 3 mL  3 mL Intravenous Q12H Birdie Riddle, MD   3 mL at 03/14/13 1033

## 2013-03-14 NOTE — Progress Notes (Signed)
Utilization Review Completed.  

## 2013-03-14 NOTE — Progress Notes (Signed)
Subjective:  Patient denies any chest pain states breathing and leg swelling has improved. Tolerated hemodialysis well  Objective:  Vital Signs in the last 24 hours: Temp:  [97.8 F (36.6 C)-98.5 F (36.9 C)] 97.8 F (36.6 C) (08/04 0957) Pulse Rate:  [71-87] 83 (08/04 1015) Resp:  [14-22] 20 (08/04 0957) BP: (125-158)/(67-84) 142/68 mmHg (08/04 1015) SpO2:  [92 %-100 %] 99 % (08/04 1015) Weight:  [105 kg (231 lb 7.7 oz)-109.4 kg (241 lb 2.9 oz)] 105 kg (231 lb 7.7 oz) (08/04 0957)  Intake/Output from previous day: 08/03 0701 - 08/04 0700 In: 1022 [P.O.:60; I.V.:962] Out: 3725 [Urine:225] Intake/Output from this shift: Total I/O In: 23 [I.V.:23] Out: 3000 [Other:3000]  Physical Exam: Neck: no adenopathy, no carotid bruit, no JVD and supple, symmetrical, trachea midline Lungs: Bi basilar decreased breath sounds with rales noted Heart: regular rate and rhythm, S1, S2 normal and Soft systolic murmur and S3 gallop noted Abdomen: soft, non-tender; bowel sounds normal; no masses,  no organomegaly Extremities: No clubbing cyanosis trace edema noted  Lab Results:  Recent Labs  03/11/13 1456 03/12/13 0553  WBC 4.5 6.2  HGB 11.7* 10.3*  PLT 122* 108*    Recent Labs  03/13/13 0612 03/14/13 0335  NA 124* 128*  K 5.3* 5.1  CL 95* 97  CO2 17* 20  GLUCOSE 93 97  BUN 83* 61*  CREATININE 7.31* 6.00*   No results found for this basename: TROPONINI, CK, MB,  in the last 72 hours Hepatic Function Panel  Recent Labs  03/12/13 0553  03/14/13 0335  PROT 5.8*  --   --   ALBUMIN 2.8*  < > 2.8*  AST 16  --   --   ALT 20  --   --   ALKPHOS 60  --   --   BILITOT 0.5  --   --   < > = values in this interval not displayed. No results found for this basename: CHOL,  in the last 72 hours No results found for this basename: PROTIME,  in the last 72 hours  Imaging: Imaging results have been reviewed and US Renal  03/13/2013   *RADIOLOGY REPORT*  Clinical Data: Chronic kidney  disease.  Poor urine output.  RENAL/URINARY TRACT ULTRASOUND COMPLETE  Comparison:  08/24/2012  Findings:  Right Kidney:  10.5 cm.  Cortical thinning and increased echotexture.  No focal abnormality or hydronephrosis.  Left Kidney:  11.1 cm.  Mildly increased echotexture.  No focal abnormality.  No hydronephrosis.  Bladder:  Incompletely distended.  Mild diffuse bladder wall thickening.  Mild ascites noted in the abdomen.  IMPRESSION: No evidence of hydronephrosis.  Increased echotexture within the kidneys compatible with chronic medical renal disease.   Original Report Authenticated By: Rolm Baptise, M.D.   Dg Chest Port 1 View  03/13/2013   *RADIOLOGY REPORT*  Clinical Data: Short of breath, evaluate for CHF  PORTABLE CHEST - 1 VIEW  Comparison: Prior chest x-ray 08/30/2012  Findings: Stable cardiomegaly.  Left subclavian approach cardiac rhythm maintenance device.  The lead projects over the right ventricle.  Pulmonary vascular congestion with perhaps mild interstitial edema, stable compared to prior.  No large pleural effusion or new consolidation.  IMPRESSION: Stable cardiomegaly and pulmonary vascular congestion with mild interstitial edema.  No significant interval progression compared to 03/11/2013.   Original Report Authenticated By: Jacqulynn Cadet, M.D.    Cardiac Studies:  Assessment/Plan:  Status post recurrent hypoglycemia Chronic kidney disease stage IV non-hemodialysis Nonischemic dilated cardiomyopathy  status post ICD in the past Resolving decompensated systolic heart failure Hypertension Non-insulin-dependent diabetes mellitus Hypercholesteremia History of gouty arthritis History of alcohol and tobacco abuse Hyponatremia Status post hypokalemia Plan Continue present management as per renal service Check labs in a.m.  LOS: 3 days    Tilla Wilborn N 03/14/2013, 11:44 AM

## 2013-03-14 NOTE — H&P (Signed)
Timothy Faulkner is an 67 y.o. male.   Chief Complaint: AVF placed 12/2012 Not yet mature Worsening renal fxns; SOB; edema; diminished UOP Need dialysis now Temp cath placed Rt froin 8/3- Renal MD Now scheduled for tunneled dialysis catheter in IR  HPI: HTN; HLD; ESRD; CHF; DM; defibrillator  Past Medical History  Diagnosis Date  . HYPERTENSION   . HYPERCHOLESTEROLEMIA, MIXED   . CHF   . DIAB W/O COMP TYPE II/UNS NOT STATED UNCNTRL   . 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 Anemia   . Acute on chronic renal failure     /notes 03/11/2013 (03/11/2013)  . Heart murmur   . Automatic implantable cardioverter-defibrillator in situ     [employer]    Past Surgical History  Procedure Laterality Date  . Incision and drainage abscess / hematoma of bursa / knee / thigh Left 2012    "knee" (03/11/2013)  . Cardiac defibrillator placement      [employer]  . Av fistula placement Right 12/13/2012    Procedure: ARTERIOVENOUS (AV) FISTULA CREATION;  Surgeon: [name], MD;  Location: [facility] OR;  Service: Vascular;  Laterality: Right;  Ultrasound guided    Family History  Problem Relation Age of Onset  . Heart disease Father    Social History:  reports that he quit smoking about 20 years ago. His smoking use included Cigarettes. He has a 22.5 pack-year smoking history. He has never used smokeless tobacco. He reports that  drinks alcohol. He reports that he does not use illicit drugs.  Allergies: No Known Allergies  Medications Prior to Admission  Medication Sig Dispense Refill  . aspirin EC 81 MG EC tablet Take 1 tablet (81 mg total) by mouth daily.  30 tablet  3  . calcitRIOL (ROCALTROL) 0.25 MCG capsule Take 0.25 mcg by mouth 3 (three) times a week. Monday, Wednesday and Friday      . carvedilol (COREG) 25 MG tablet Take 25 mg by mouth 2 (two) times daily with a meal.        . colchicine 0.6 MG tablet Take 0.6 mg by mouth daily as needed.      . digoxin (LANOXIN) 0.125 MG  tablet Take 1 tablet (0.125 mg total) by mouth daily.  30 tablet  3  . ezetimibe-simvastatin (VYTORIN) 10-40 MG per tablet Take 1 tablet by mouth at bedtime.        . furosemide (LASIX) 40 MG tablet Take 40 mg by mouth 2 (two) times daily.      Marland Kitchen glipiZIDE (GLUCOTROL XL) 5 MG 24 hr tablet Take 5 mg by mouth daily.       . isosorbide-hydrALAZINE (BIDIL) 20-37.5 MG per tablet Take 1 tablet by mouth 2 (two) times daily.        . Vitamin D, Ergocalciferol, (DRISDOL) 50000 UNITS CAPS Take 50,000 Units by mouth every 7 (seven) days. mondays        Results for orders placed during the hospital encounter of 03/11/13 (from the past 48 hour(s))  GLUCOSE, CAPILLARY     Status: Abnormal   Collection Time    03/12/13 12:11 PM      Result Value Range   Glucose-Capillary 41 (*) 70 - 99 mg/dL  GLUCOSE, CAPILLARY     Status: Abnormal   Collection Time    03/12/13  1:08 PM      Result Value Range   Glucose-Capillary 179 (*) 70 - 99 mg/dL  GLUCOSE, CAPILLARY     Status:  None   Collection Time    03/12/13  2:01 PM      Result Value Range   Glucose-Capillary 95  70 - 99 mg/dL  GLUCOSE, CAPILLARY     Status: Abnormal   Collection Time    03/12/13  3:07 PM      Result Value Range   Glucose-Capillary 69 (*) 70 - 99 mg/dL  GLUCOSE, CAPILLARY     Status: Abnormal   Collection Time    03/12/13  3:50 PM      Result Value Range   Glucose-Capillary 51 (*) 70 - 99 mg/dL  GLUCOSE, CAPILLARY     Status: None   Collection Time    03/12/13  4:16 PM      Result Value Range   Glucose-Capillary 80  70 - 99 mg/dL  GLUCOSE, CAPILLARY     Status: Abnormal   Collection Time    03/12/13  5:05 PM      Result Value Range   Glucose-Capillary 55 (*) 70 - 99 mg/dL  GLUCOSE, CAPILLARY     Status: Abnormal   Collection Time    03/12/13  6:15 PM      Result Value Range   Glucose-Capillary 66 (*) 70 - 99 mg/dL  GLUCOSE, CAPILLARY     Status: Abnormal   Collection Time    03/12/13  7:01 PM      Result Value Range    Glucose-Capillary 110 (*) 70 - 99 mg/dL  GLUCOSE, CAPILLARY     Status: Abnormal   Collection Time    03/12/13  7:47 PM      Result Value Range   Glucose-Capillary 66 (*) 70 - 99 mg/dL  GLUCOSE, CAPILLARY     Status: Abnormal   Collection Time    03/12/13  8:11 PM      Result Value Range   Glucose-Capillary 118 (*) 70 - 99 mg/dL  GLUCOSE, CAPILLARY     Status: Abnormal   Collection Time    03/12/13  8:44 PM      Result Value Range   Glucose-Capillary 62 (*) 70 - 99 mg/dL  GLUCOSE, CAPILLARY     Status: Abnormal   Collection Time    03/12/13  9:09 PM      Result Value Range   Glucose-Capillary 109 (*) 70 - 99 mg/dL  GLUCOSE, CAPILLARY     Status: None   Collection Time    03/12/13 10:56 PM      Result Value Range   Glucose-Capillary 97  70 - 99 mg/dL  GLUCOSE, CAPILLARY     Status: None   Collection Time    03/13/13 12:58 AM      Result Value Range   Glucose-Capillary 94  70 - 99 mg/dL  GLUCOSE, CAPILLARY     Status: None   Collection Time    03/13/13  3:44 AM      Result Value Range   Glucose-Capillary 86  70 - 99 mg/dL  RENAL FUNCTION PANEL     Status: Abnormal   Collection Time    03/13/13  6:12 AM      Result Value Range   Sodium 124 (*) 135 - 145 mEq/L   Comment: DELTA CHECK NOTED   Potassium 5.3 (*) 3.5 - 5.1 mEq/L   Chloride 95 (*) 96 - 112 mEq/L   CO2 17 (*) 19 - 32 mEq/L   Glucose, Bld 93  70 - 99 mg/dL   BUN 83 (*) 6 - 23 mg/dL  Creatinine, Ser 7.31 (*) 0.50 - 1.35 mg/dL   Calcium 8.3 (*) 8.4 - 10.5 mg/dL   Phosphorus 3.9  2.3 - 4.6 mg/dL   Albumin 2.8 (*) 3.5 - 5.2 g/dL   GFR calc non Af Amer 7 (*) >90 mL/min   GFR calc Af Amer 8 (*) >90 mL/min   Comment:            The eGFR has been calculated     using the CKD EPI equation.     This calculation has not been     validated in all clinical     situations.     eGFR's persistently     <90 mL/min signify     possible Chronic Kidney Disease.  GLUCOSE, CAPILLARY     Status: None   Collection Time     03/13/13  7:02 AM      Result Value Range   Glucose-Capillary 98  70 - 99 mg/dL  GLUCOSE, CAPILLARY     Status: Abnormal   Collection Time    03/13/13  7:29 AM      Result Value Range   Glucose-Capillary 105 (*) 70 - 99 mg/dL  MRSA PCR SCREENING     Status: None   Collection Time    03/13/13  7:48 AM      Result Value Range   MRSA by PCR NEGATIVE  NEGATIVE   Comment:            The GeneXpert MRSA Assay (FDA     approved for NASAL specimens     only), is one component of a     comprehensive MRSA colonization     surveillance program. It is not     intended to diagnose MRSA     infection nor to guide or     monitor treatment for     MRSA infections.  GLUCOSE, CAPILLARY     Status: Abnormal   Collection Time    03/13/13 10:51 AM      Result Value Range   Glucose-Capillary 106 (*) 70 - 99 mg/dL  HEPATITIS B SURFACE ANTIGEN     Status: None   Collection Time    03/13/13  3:20 PM      Result Value Range   Hepatitis B Surface Ag NEGATIVE  NEGATIVE  HEPATITIS B SURFACE ANTIBODY     Status: None   Collection Time    03/13/13  3:20 PM      Result Value Range   Hep B S Ab NEGATIVE  NEGATIVE  HEPATITIS B CORE ANTIBODY, TOTAL     Status: None   Collection Time    03/13/13  3:20 PM      Result Value Range   Hep B Core Total Ab NEGATIVE  NEGATIVE  GLUCOSE, CAPILLARY     Status: None   Collection Time    03/13/13  4:52 PM      Result Value Range   Glucose-Capillary 91  70 - 99 mg/dL   Comment 1 Documented in Chart    GLUCOSE, CAPILLARY     Status: None   Collection Time    03/13/13  6:23 PM      Result Value Range   Glucose-Capillary 92  70 - 99 mg/dL  GLUCOSE, CAPILLARY     Status: None   Collection Time    03/13/13  9:56 PM      Result Value Range   Glucose-Capillary 98  70 - 99 mg/dL   Comment  1 Notify RN    RENAL FUNCTION PANEL     Status: Abnormal   Collection Time    03/14/13  3:35 AM      Result Value Range   Sodium 128 (*) 135 - 145 mEq/L   Potassium 5.1  3.5  - 5.1 mEq/L   Chloride 97  96 - 112 mEq/L   CO2 20  19 - 32 mEq/L   Glucose, Bld 97  70 - 99 mg/dL   BUN 61 (*) 6 - 23 mg/dL   Creatinine, Ser 6.00 (*) 0.50 - 1.35 mg/dL   Calcium 8.4  8.4 - 10.5 mg/dL   Phosphorus 4.3  2.3 - 4.6 mg/dL   Albumin 2.8 (*) 3.5 - 5.2 g/dL   GFR calc non Af Amer 9 (*) >90 mL/min   GFR calc Af Amer 10 (*) >90 mL/min   Comment:            The eGFR has been calculated     using the CKD EPI equation.     This calculation has not been     validated in all clinical     situations.     eGFR's persistently     <90 mL/min signify     possible Chronic Kidney Disease.  GLUCOSE, CAPILLARY     Status: None   Collection Time    03/14/13  5:18 AM      Result Value Range   Glucose-Capillary 99  70 - 99 mg/dL  GLUCOSE, CAPILLARY     Status: Abnormal   Collection Time    03/14/13 10:19 AM      Result Value Range   Glucose-Capillary 67 (*) 70 - 99 mg/dL  GLUCOSE, CAPILLARY     Status: None   Collection Time    03/14/13 10:44 AM      Result Value Range   Glucose-Capillary 90  70 - 99 mg/dL  GLUCOSE, CAPILLARY     Status: None   Collection Time    03/14/13 11:46 AM      Result Value Range   Glucose-Capillary 93  70 - 99 mg/dL   US Renal  03/13/2013   *RADIOLOGY REPORT*  Clinical Data: Chronic kidney disease.  Poor urine output.  RENAL/URINARY TRACT ULTRASOUND COMPLETE  Comparison:  08/24/2012  Findings:  Right Kidney:  10.5 cm.  Cortical thinning and increased echotexture.  No focal abnormality or hydronephrosis.  Left Kidney:  11.1 cm.  Mildly increased echotexture.  No focal abnormality.  No hydronephrosis.  Bladder:  Incompletely distended.  Mild diffuse bladder wall thickening.  Mild ascites noted in the abdomen.  IMPRESSION: No evidence of hydronephrosis.  Increased echotexture within the kidneys compatible with chronic medical renal disease.   Original Report Authenticated By: Rolm Baptise, M.D.   Dg Chest Port 1 View  03/13/2013   *RADIOLOGY REPORT*  Clinical  Data: Short of breath, evaluate for CHF  PORTABLE CHEST - 1 VIEW  Comparison: Prior chest x-ray 08/30/2012  Findings: Stable cardiomegaly.  Left subclavian approach cardiac rhythm maintenance device.  The lead projects over the right ventricle.  Pulmonary vascular congestion with perhaps mild interstitial edema, stable compared to prior.  No large pleural effusion or new consolidation.  IMPRESSION: Stable cardiomegaly and pulmonary vascular congestion with mild interstitial edema.  No significant interval progression compared to 03/11/2013.   Original Report Authenticated By: Jacqulynn Cadet, M.D.    Review of Systems  Constitutional: Negative for fever.  Respiratory: Positive for shortness of breath.  Cardiovascular: Negative for chest pain.  Gastrointestinal: Negative for nausea, vomiting and abdominal pain.  Musculoskeletal: Negative for back pain.  Neurological: Negative for headaches.    Blood pressure 142/68, pulse 83, temperature 97.8 F (36.6 C), temperature source Oral, resp. rate 20, height 5\' 8"  (1.727 m), weight 231 lb 7.7 oz (105 kg), SpO2 99.00%. Physical Exam  Constitutional: He is oriented to person, place, and time. He appears well-nourished.  Cardiovascular: Normal rate, regular rhythm and normal heart sounds.   No murmur heard. Respiratory: Effort normal. He has wheezes.  GI: Soft. Bowel sounds are normal. There is tenderness.  Musculoskeletal: Normal range of motion.  Rt groin temp cath  Neurological: He is alert and oriented to person, place, and time.  Skin: Skin is dry.  Psychiatric: He has a normal mood and affect. His behavior is normal. Judgment and thought content normal.     Assessment/Plan ESRD AVF not mature- placed 12/2012 Decreased UOP; worsening renal fxn; SOB Temp cath in place 8/3 Need Perm Cath Scheduled for tunneled hemodialysis catheter placement in IR 8/5 Pt aware of procedure benefits and risks and agreeable to proceed Consent signed and in  chart  [place] A 03/14/2013, 12:04 PM

## 2013-03-14 NOTE — H&P (Signed)
Agree 

## 2013-03-14 NOTE — Progress Notes (Signed)
Bleeding noted at Right femoral HD cath site.  Sterile gauze and pressure held until bleeding subsided.  Area cleansed with sterile saline and sterile gauze.  New dressing applied. Dr. Doylene Canard notified, he advised to hold heparin sq tonight. Will continue to monitor for further bleeding or other complications.

## 2013-03-14 NOTE — Progress Notes (Signed)
Hypoglycemic Event  CBG: 67  Treatment: 15 GM carbohydrate snack  Symptoms: None  Follow-up CBG: Time:1045 CBG Result:90  Possible Reasons for Event: Unknown  Comments/MD notified:[place]    Timothy Faulkner Timothy Faulkner  Remember to initiate Hypoglycemia Order Set & complete

## 2013-03-15 ENCOUNTER — Inpatient Hospital Stay (HOSPITAL_COMMUNITY): Payer: Medicare Other

## 2013-03-15 LAB — GLUCOSE, CAPILLARY
Glucose-Capillary: 96 mg/dL (ref 70–99)
Glucose-Capillary: 145 mg/dL — ABNORMAL HIGH (ref 70–99)

## 2013-03-15 LAB — RENAL FUNCTION PANEL
CO2: 23 mEq/L (ref 19–32)
Chloride: 98 mEq/L (ref 96–112)
Creatinine, Ser: 5.31 mg/dL — ABNORMAL HIGH (ref 0.50–1.35)
GFR calc Af Amer: 12 mL/min — ABNORMAL LOW (ref >90)
GFR calc non Af Amer: 10 mL/min — ABNORMAL LOW (ref >90)
Glucose, Bld: 112 mg/dL — ABNORMAL HIGH (ref 70–99)
Sodium: 130 mEq/L — ABNORMAL LOW (ref 135–145)

## 2013-03-15 LAB — CBC
Platelets: 101 10*3/uL — ABNORMAL LOW (ref 150–400)
RBC: 2.96 MIL/uL — ABNORMAL LOW (ref 4.22–5.81)
RDW: 15.4 % (ref 11.5–15.5)
WBC: 8 10*3/uL (ref 4.0–10.5)

## 2013-03-15 MED ORDER — HEPARIN SODIUM (PORCINE) 1000 UNIT/ML IJ SOLN
INTRAMUSCULAR | Status: AC
  Filled 2013-03-15: qty 1

## 2013-03-15 MED ORDER — SODIUM CHLORIDE 0.9 % IV SOLN
25.0000 mg | INTRAVENOUS | Status: DC
  Administered 2013-03-16: 25 mg via INTRAVENOUS
  Filled 2013-03-15 (×2): qty 2

## 2013-03-15 MED ORDER — CEFAZOLIN SODIUM-DEXTROSE 2-3 GM-% IV SOLR
2.0000 g | Freq: Once | INTRAVENOUS | Status: AC
  Administered 2013-03-15: 2 g via INTRAVENOUS
  Filled 2013-03-15: qty 50

## 2013-03-15 MED ORDER — DARBEPOETIN ALFA-POLYSORBATE 40 MCG/0.4ML IJ SOLN
40.0000 ug | INTRAMUSCULAR | Status: DC
  Administered 2013-03-16: 40 ug via INTRAVENOUS
  Filled 2013-03-15: qty 0.4

## 2013-03-15 MED ORDER — FENTANYL CITRATE 0.05 MG/ML IJ SOLN
INTRAMUSCULAR | Status: AC | PRN
  Administered 2013-03-15 (×2): 50 ug via INTRAVENOUS

## 2013-03-15 MED ORDER — MIDAZOLAM HCL 2 MG/2ML IJ SOLN
INTRAMUSCULAR | Status: AC | PRN
  Administered 2013-03-15: 2 mg via INTRAVENOUS

## 2013-03-15 NOTE — Progress Notes (Signed)
Subjective:  Patient denies any chest pain or shortness of breath. Scheduled for permanent dialysis catheter tomorrow  2-D echo showed severe global hypokinesia with EF approximately 20-25%  Objective:  Vital Signs in the last 24 hours: Temp:  [97.7 F (36.5 C)-98.5 F (36.9 C)] 98.2 F (36.8 C) (08/05 0832) Pulse Rate:  [78-87] 83 (08/05 0832) Resp:  [15-21] 16 (08/05 0832) BP: (118-147)/(66-97) 143/67 mmHg (08/05 0832) SpO2:  [92 %-99 %] 98 % (08/05 0832) Weight:  [105.3 kg (232 lb 2.3 oz)] 105.3 kg (232 lb 2.3 oz) (08/05 0416)  Intake/Output from previous day: 08/04 0701 - 08/05 0700 In: [id number] [P.O.:240; I.V.:423] Out: 3200 [Urine:200] Intake/Output from this shift: Total I/O In: 20 [I.V.:20] Out: -   Physical Exam: Neck: no adenopathy, no carotid bruit, no JVD and supple, symmetrical, trachea midline Lungs: Decreased breath sound at bases Heart: regular rate and rhythm, S1, S2 normal and Soft systolic murmur and S3 gallop noted Abdomen: soft, non-tender; bowel sounds normal; no masses,  no organomegaly Extremities: No clubbing cyanosis trace edema noted  Lab Results:  Recent Labs  03/15/13 0414  WBC 8.0  HGB 9.5*  PLT 101*    Recent Labs  03/14/13 0335 03/15/13 0414  NA 128* 130*  K 5.1 4.3  CL 97 98  CO2 20 23  GLUCOSE 97 112*  BUN 61* 49*  CREATININE 6.00* 5.31*   No results found for this basename: TROPONINI, CK, MB,  in the last 72 hours Hepatic Function Panel  Recent Labs  03/15/13 0414  ALBUMIN 2.5*   No results found for this basename: CHOL,  in the last 72 hours No results found for this basename: PROTIME,  in the last 72 hours  Imaging: Imaging results have been reviewed and Dg Chest Port 1 View  03/13/2013   *RADIOLOGY REPORT*  Clinical Data: Short of breath, evaluate for CHF  PORTABLE CHEST - 1 VIEW  Comparison: Prior chest x-ray 08/30/2012  Findings: Stable cardiomegaly.  Left subclavian approach cardiac rhythm maintenance device.  The  lead projects over the right ventricle.  Pulmonary vascular congestion with perhaps mild interstitial edema, stable compared to prior.  No large pleural effusion or new consolidation.  IMPRESSION: Stable cardiomegaly and pulmonary vascular congestion with mild interstitial edema.  No significant interval progression compared to 03/11/2013.   Original Report Authenticated By: Jacqulynn Cadet, M.D.    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

## 2013-03-15 NOTE — ED Notes (Signed)
Patient denies pain and is resting comfortably.  

## 2013-03-15 NOTE — Procedures (Signed)
RIJV HD catheter tip RA No comp

## 2013-03-15 NOTE — Progress Notes (Signed)
34M new start HD from CKD5, admitted with hypoglycemia; has immature AVF  Subjective:  No complaints BS have bee nstable, not lows Eating and drinking ok Hasn't yet rec tunneled CVC  Current meds: reviewed and includes calcitriol TIW Current Labs: reviewed Na 130, K 4.3, BUN 49, Ca 8.2, Phos 3.6, Hb 9.5  Physical Exam:  Blood pressure 147/69, pulse 79, temperature 98.3 F (36.8 C), temperature source Oral, resp. rate 17, height 5\' 8"  (1.727 m), weight 105.3 kg (232 lb 2.3 oz), SpO2 100.00%.  NAD RRR CTAB S/NT/ND No rashes AAOx3  Assessment/Plan 1. New Start ESRD: have intiated placement for new start.   2. Anemia of CKD: started ESA (aranesp 39mcg qwk; sounds ilke was on ProCrit as outpt) and Ferlcit 125mg  qMWF w/ Tx x8.   3.  CMDBMD: cont calcitriol. Ca and phos acceptable.  Not on binder now 4.  S/p R UE AVF: will be watched as outpt. Not yet mature.     Pearson Grippe MD 03/15/2013, 1:07 PM   Recent Labs Lab 03/13/13 0612 03/14/13 0335 03/15/13 0414  NA 124* 128* 130*  K 5.3* 5.1 4.3  CL 95* 97 98  CO2 17* 20 23  GLUCOSE 93 97 112*  BUN 83* 61* 49*  CREATININE 7.31* 6.00* 5.31*  CALCIUM 8.3* 8.4 8.2*  PHOS 3.9 4.3 3.6    Recent Labs Lab 03/11/13 1456 03/12/13 0553 03/15/13 0414  WBC 4.5 6.2 8.0  NEUTROABS 3.3  --   --   HGB 11.7* 10.3* 9.5*  HCT 35.7* 31.1* 28.0*  MCV 95.7 94.8 94.6  PLT 122* 108* 101*    Current Facility-Administered Medications  Medication Dose Route Frequency Provider Last Rate Last Dose  . aspirin EC tablet 81 mg  81 mg Oral Daily Birdie Riddle, MD   81 mg at 03/15/13 0947  . calcitRIOL (ROCALTROL) capsule 0.25 mcg  0.25 mcg Oral 3 times weekly Birdie Riddle, MD   0.25 mcg at 03/14/13 0900  . dextrose (GLUTOSE) 40 % oral gel 37.5 g  1 Tube Oral PRN Birdie Riddle, MD   37.5 g at 03/12/13 2051  . dextrose 10 % infusion   Intravenous Continuous Birdie Riddle, MD 20 mL/hr at 03/14/13 2000    . docusate sodium (COLACE) capsule 100  mg  100 mg Oral BID Birdie Riddle, MD   100 mg at 03/15/13 0947  . [START ON 03/16/2013] heparin injection 5,000 Units  5,000 Units Subcutaneous Q8H Birdie Riddle, MD      . insulin aspart (novoLOG) injection 0-9 Units  0-9 Units Subcutaneous TID WC Birdie Riddle, MD   1 Units at 03/14/13 1657  . isosorbide-hydrALAZINE (BIDIL) 20-37.5 MG per tablet 1 tablet  1 tablet Oral BID Birdie Riddle, MD   1 tablet at 03/15/13 0947  . multivitamin with minerals tablet 1 tablet  1 tablet Oral Daily Birdie Riddle, MD   1 tablet at 03/15/13 0947  . ondansetron (ZOFRAN) tablet 4 mg  4 mg Oral Q6H PRN Birdie Riddle, MD   4 mg at 03/13/13 1326   Or  . ondansetron (ZOFRAN) injection 4 mg  4 mg Intravenous Q6H PRN Birdie Riddle, MD      . pantoprazole (PROTONIX) EC tablet 40 mg  40 mg Oral Daily Birdie Riddle, MD   40 mg at 03/15/13 0948  . sodium chloride 0.9 % injection 3 mL  3 mL Intravenous Q12H Birdie Riddle, MD  3 mL at 03/14/13 2120

## 2013-03-16 ENCOUNTER — Encounter (HOSPITAL_COMMUNITY): Payer: Medicare Other

## 2013-03-16 LAB — RENAL FUNCTION PANEL
Albumin: 2.4 g/dL — ABNORMAL LOW (ref 3.5–5.2)
Chloride: 100 mEq/L (ref 96–112)
GFR calc non Af Amer: 9 mL/min — ABNORMAL LOW (ref >90)
Potassium: 4.3 mEq/L (ref 3.5–5.1)
Sodium: 131 mEq/L — ABNORMAL LOW (ref 135–145)

## 2013-03-16 LAB — GLUCOSE, CAPILLARY: Glucose-Capillary: 207 mg/dL — ABNORMAL HIGH (ref 70–99)

## 2013-03-16 MED ORDER — DARBEPOETIN ALFA-POLYSORBATE 40 MCG/0.4ML IJ SOLN
INTRAMUSCULAR | Status: AC
  Filled 2013-03-16: qty 0.4

## 2013-03-16 NOTE — Progress Notes (Signed)
Pt having continous oozing of blood from R IJV HD cathether. Multiple dressing changes. Paged Nephrology team. Dr. Lorrene Leathia Farnell reccommended to continue to change and place occlusive dressing on site. Spoke to [place] at radiology. She stated she would  make a note for the MD at interventional radiology to assess pt site upon arrival to hospital in AM. Will continue to monitor pt.

## 2013-03-16 NOTE — Progress Notes (Signed)
Subjective:  Patient denies any chest pain or shortness of breath. Tolerating hemodialysis  Objective:  Vital Signs in the last 24 hours: Temp:  [97.5 F (36.4 C)-98.5 F (36.9 C)] 98.2 F (36.8 C) (08/06 1143) Pulse Rate:  [79-94] 94 (08/06 1143) Resp:  [10-23] 21 (08/06 1143) BP: (119-165)/(51-95) 161/86 mmHg (08/06 1143) SpO2:  [93 %-100 %] 99 % (08/06 1143) Weight:  [100.8 kg (222 lb 3.6 oz)-104.2 kg (229 lb 11.5 oz)] 100.8 kg (222 lb 3.6 oz) (08/06 1143)  Intake/Output from previous day: 08/05 0701 - 08/06 0700 In: 550 [P.O.:300; I.V.:200; IV Piggyback:50] Out: 675 [Urine:675] Intake/Output from this shift: Total I/O In: -  Out: [id number] [Urine:300; Other:2971]  Physical Exam: Neck: no adenopathy, no carotid bruit, no JVD and supple, symmetrical, trachea midline Lungs: Decreased breath sound at bases Heart: regular rate and rhythm, S1, S2 normal and Soft systolic murmur and S3 gallop noted Abdomen: soft, non-tender; bowel sounds normal; no masses,  no organomegaly Extremities: No clubbing cyanosis 1+ edema noted  Lab Results:  Recent Labs  03/15/13 0414  WBC 8.0  HGB 9.5*  PLT 101*    Recent Labs  03/15/13 0414 03/16/13 0430  NA 130* 131*  K 4.3 4.3  CL 98 100  CO2 23 22  GLUCOSE 112* 156*  BUN 49* 54*  CREATININE 5.31* 5.82*   No results found for this basename: TROPONINI, CK, MB,  in the last 72 hours Hepatic Function Panel  Recent Labs  03/16/13 0430  ALBUMIN 2.4*   No results found for this basename: CHOL,  in the last 72 hours No results found for this basename: PROTIME,  in the last 72 hours  Imaging: Imaging results have been reviewed and Ir Fluoro Guide Cv Line Right  03/15/2013   *RADIOLOGY REPORT*  Clinical Data/Indication: RENAL FAILURE  TUNNELED DIALYSIS CATHETER PLACEMENT, ULTRASOUND GUIDANCE FOR VASCULAR ACCESS  Sedation: Versed 2.0 mg, Fentanyl 100 mcg.  Total Moderate Sedation Time: 15 minutes.  As antibiotic prophylaxis, Ancef  was  ordered pre-procedure and administered intravenously within one hour of incision.  Fluoroscopy Time: 24 seconds.  Procedure: The procedure, risks, benefits, and alternatives were explained to the patient. Questions regarding the procedure were encouraged and answered. The patient understands and consents to the procedure.  The right neck was prepped with betadine in a sterile fashion, and a sterile drape was applied covering the operative field. A sterile gown and sterile gloves were used for the procedure. 1% lidocaine into the skin and subcutaneous tissue.  The right internal jugular vein was noted to be patent initially with ultrasound.  Under sonographic guidance, a micropuncture needle was inserted into the right IJ vein (Ultrasound and fluoroscopic image documentation was performed). It was removed over an 018 wire which was upsized to an Amplatz.  This was advanced into the IVC.  A small incision was made in the right upper chest.  The tunneling device was utilized to advance the 23 centimeter tip to cuff catheter from the chest incision and out the neck incision.  A peel- away sheath was advanced over the Amplatz wire.  The leading edge of the catheter was then advanced through the peel-away sheath. The peel-away sheath was removed.  It was flushed and instilled with heparin.  The chest incision was closed with a 0 Prolene pursestring stitch.  The neck incision was closed with a 4-0 Vicryl subcuticular stitch.  No complications.  FINDINGS: The image demonstrates placement of a tunneled dialysis catheter with its tip  in the right atrium.  IMPRESSION: Successful right IJ vein tunneled dialysis catheter with its tip in the right atrium.   Original Report Authenticated By: Marybelle Killings, M.D.   Ir US Guide Vasc Access Right  03/15/2013   *RADIOLOGY REPORT*  Clinical Data/Indication: RENAL FAILURE  TUNNELED DIALYSIS CATHETER PLACEMENT, ULTRASOUND GUIDANCE FOR VASCULAR ACCESS  Sedation: Versed 2.0 mg, Fentanyl 100  mcg.  Total Moderate Sedation Time: 15 minutes.  As antibiotic prophylaxis, Ancef  was ordered pre-procedure and administered intravenously within one hour of incision.  Fluoroscopy Time: 24 seconds.  Procedure: The procedure, risks, benefits, and alternatives were explained to the patient. Questions regarding the procedure were encouraged and answered. The patient understands and consents to the procedure.  The right neck was prepped with betadine in a sterile fashion, and a sterile drape was applied covering the operative field. A sterile gown and sterile gloves were used for the procedure. 1% lidocaine into the skin and subcutaneous tissue.  The right internal jugular vein was noted to be patent initially with ultrasound.  Under sonographic guidance, a micropuncture needle was inserted into the right IJ vein (Ultrasound and fluoroscopic image documentation was performed). It was removed over an 018 wire which was upsized to an Amplatz.  This was advanced into the IVC.  A small incision was made in the right upper chest.  The tunneling device was utilized to advance the 23 centimeter tip to cuff catheter from the chest incision and out the neck incision.  A peel- away sheath was advanced over the Amplatz wire.  The leading edge of the catheter was then advanced through the peel-away sheath. The peel-away sheath was removed.  It was flushed and instilled with heparin.  The chest incision was closed with a 0 Prolene pursestring stitch.  The neck incision was closed with a 4-0 Vicryl subcuticular stitch.  No complications.  FINDINGS: The image demonstrates placement of a tunneled dialysis catheter with its tip in the right atrium.  IMPRESSION: Successful right IJ vein tunneled dialysis catheter with its tip in the right atrium.   Original Report Authenticated By: Marybelle Killings, M.D.    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

## 2013-03-16 NOTE — Progress Notes (Signed)
70M new start HD from CKD5, admitted with hypoglycemia; has immature AVF   Subjective:  S/p tunneled line yesterday HD planned for today Some oozing at cathter insertion site Pt w/o complaints.  No SOB.  Good appetite  08/05 0701 - 08/06 0700 In: 550 [P.O.:300; I.V.:200; IV Piggyback:50] Out: 675 [Urine:675]  Current meds: reviewed including calcitriol, aranesp, nulecit,  Current Labs: reviewed, stable   Physical Exam:  Blood pressure 156/85, pulse 94, temperature 97.5 F (36.4 C), temperature source Oral, resp. rate 12, height 5\' 8"  (1.727 m), weight 104.2 kg (229 lb 11.5 oz), SpO2 97.00%. NAD, on dialysis RRR, nl s1s2 CTAB, nl wob R IJ tunneled CVC bandaged 2-3+ pitting LEE  Assessment/Plan 1. New Start ESRD: have intiated placement for new start. HD today and keep on MWF schedule for now 2. Anemia of CKD: started Aranesp and Nulecit during hospitalzation 3. CMDBMD: cont calcitriol. Ca and phos acceptable. Not on binder now  4. S/p R UE AVF: will be watched as outpt. Not yet mature.   Pearson Grippe MD 03/16/2013, 9:26 AM   Recent Labs Lab 03/14/13 0335 03/15/13 0414 03/16/13 0430  NA 128* 130* 131*  K 5.1 4.3 4.3  CL 97 98 100  CO2 20 23 22   GLUCOSE 97 112* 156*  BUN 61* 49* 54*  CREATININE 6.00* 5.31* 5.82*  CALCIUM 8.4 8.2* 8.1*  PHOS 4.3 3.6 3.3    Recent Labs Lab 03/11/13 1456 03/12/13 0553 03/15/13 0414  WBC 4.5 6.2 8.0  NEUTROABS 3.3  --   --   HGB 11.7* 10.3* 9.5*  HCT 35.7* 31.1* 28.0*  MCV 95.7 94.8 94.6  PLT 122* 108* 101*    Current Facility-Administered Medications  Medication Dose Route Frequency Provider Last Rate Last Dose  . aspirin EC tablet 81 mg  81 mg Oral Daily Birdie Riddle, MD   81 mg at 03/15/13 0947  . calcitRIOL (ROCALTROL) capsule 0.25 mcg  0.25 mcg Oral 3 times weekly Birdie Riddle, MD   0.25 mcg at 03/14/13 0900  . darbepoetin (ARANESP) injection 40 mcg  40 mcg Intravenous Q Tue-HD Rexene Agent, MD      . dextrose  (GLUTOSE) 40 % oral gel 37.5 g  1 Tube Oral PRN Birdie Riddle, MD   37.5 g at 03/12/13 2051  . dextrose 10 % infusion   Intravenous Continuous Birdie Riddle, MD 20 mL/hr at 03/14/13 2000    . docusate sodium (COLACE) capsule 100 mg  100 mg Oral BID Birdie Riddle, MD   100 mg at 03/15/13 2113  . ferric gluconate (NULECIT) 25 mg in sodium chloride 0.9 % 50 mL IVPB  25 mg Intravenous Q M,W,F-HD Rexene Agent, MD      . heparin injection 5,000 Units  5,000 Units Subcutaneous Q8H Birdie Riddle, MD   5,000 Units at 03/16/13 0605  . insulin aspart (novoLOG) injection 0-9 Units  0-9 Units Subcutaneous TID WC Birdie Riddle, MD   1 Units at 03/14/13 1657  . isosorbide-hydrALAZINE (BIDIL) 20-37.5 MG per tablet 1 tablet  1 tablet Oral BID Birdie Riddle, MD   1 tablet at 03/15/13 2113  . multivitamin with minerals tablet 1 tablet  1 tablet Oral Daily Birdie Riddle, MD   1 tablet at 03/15/13 0947  . ondansetron (ZOFRAN) tablet 4 mg  4 mg Oral Q6H PRN Birdie Riddle, MD   4 mg at 03/13/13 1326   Or  . ondansetron [facility])  injection 4 mg  4 mg Intravenous Q6H PRN Birdie Riddle, MD      . pantoprazole (PROTONIX) EC tablet 40 mg  40 mg Oral Daily Birdie Riddle, MD   40 mg at 03/15/13 0948  . sodium chloride 0.9 % injection 3 mL  3 mL Intravenous Q12H Birdie Riddle, MD   3 mL at 03/15/13 2114

## 2013-03-16 NOTE — Procedures (Signed)
I was present at this dialysis session. I have reviewed the session itself and made appropriate changes.   Qb 400 via CVC.  Goal UF 3L.  Pt tolerating well.  Pearson Grippe  MD 03/16/2013, 10:06 AM

## 2013-03-17 LAB — RENAL FUNCTION PANEL
CO2: 26 mEq/L (ref 19–32)
Calcium: 8 mg/dL — ABNORMAL LOW (ref 8.4–10.5)
Chloride: 103 mEq/L (ref 96–112)
Creatinine, Ser: 3.86 mg/dL — ABNORMAL HIGH (ref 0.50–1.35)
GFR calc Af Amer: 17 mL/min — ABNORMAL LOW (ref >90)
Glucose, Bld: 117 mg/dL — ABNORMAL HIGH (ref 70–99)
Sodium: 138 mEq/L (ref 135–145)

## 2013-03-17 LAB — GLUCOSE, CAPILLARY: Glucose-Capillary: 109 mg/dL — ABNORMAL HIGH (ref 70–99)

## 2013-03-17 NOTE — Plan of Care (Signed)
Problem: Phase III Progression Outcomes Goal: Activity at appropriate level-compared to baseline (UP IN CHAIR FOR HEMODIALYSIS)  Outcome: Progressing Per MD order is already sent to outpatient HD

## 2013-03-17 NOTE — Progress Notes (Signed)
03/17/2013 11:03 AM Hemodialysis Outpatient Note; this patient has been accepted at the [facility] dialysis center on a Mon-Wed-Fri 2nd shift schedule. His chair time is 12pm but will need to arrive at 11:00 AM tomorrow, August 8th to initiate paperwork.The center is located at [place], [place] # [phone]. Thank you. Gordy Savers

## 2013-03-17 NOTE — Progress Notes (Signed)
Pt was discharged home with wife, pt was alert and oriented without questions or concerns regarding discharge teaching.  Belongings were carried home by pt via wheelchair

## 2013-03-17 NOTE — Discharge Summary (Signed)
  D/c [id number]

## 2013-03-17 NOTE — Progress Notes (Addendum)
UPDATE 1030am: Pt with chair at [facility] on MWF.  SHould be there at 11am tomorrow.    43M new start HD from CKD5, admitted with hypoglycemia; has immature AVF   Subjective:  HD yesterday Still has R femoral temp CVC No complaints  08/06 0701 - 08/07 0700 In: 700 [I.V.:700] Out: 3271 [Urine:300]  Current meds: reviewed  Current Labs: reviewed    Physical Exam:  Blood pressure 148/66, pulse 73, temperature 98.1 F (36.7 C), temperature source Oral, resp. rate 12, height 5\' 8"  (1.727 m), weight 101 kg (222 lb 10.6 oz), SpO2 99.00%. NAD RRR Resting comfortably R tunneled IJ CVC in place Re femoral catheter noted  Assessment/Plan 1. New Start ESRD: on MWF schedule.  Await chair.  Remove R femoral CVC today.    2. Anemia of CKD: started Aranesp and Nulecit during hospitalzation  3. CMDBMD: cont calcitriol. Ca and phos acceptable. Not on binder now. Phos low now 4. S/p R UE AVF: will be watched as outpt. Not yet mature.  Pearson Grippe MD 03/17/2013, 9:08 AM   Recent Labs Lab 03/15/13 0414 03/16/13 0430 03/17/13 0550  NA 130* 131* 138  K 4.3 4.3 3.5  CL 98 100 103  CO2 23 22 26   GLUCOSE 112* 156* 117*  BUN 49* 54* 28*  CREATININE 5.31* 5.82* 3.86*  CALCIUM 8.2* 8.1* 8.0*  PHOS 3.6 3.3 2.1*    Recent Labs Lab 03/11/13 1456 03/12/13 0553 03/15/13 0414  WBC 4.5 6.2 8.0  NEUTROABS 3.3  --   --   HGB 11.7* 10.3* 9.5*  HCT 35.7* 31.1* 28.0*  MCV 95.7 94.8 94.6  PLT 122* 108* 101*    Current Facility-Administered Medications  Medication Dose Route Frequency Provider Last Rate Last Dose  . aspirin EC tablet 81 mg  81 mg Oral Daily Birdie Riddle, MD   81 mg at 03/16/13 1312  . calcitRIOL (ROCALTROL) capsule 0.25 mcg  0.25 mcg Oral 3 times weekly Birdie Riddle, MD   0.25 mcg at 03/14/13 0900  . darbepoetin (ARANESP) injection 40 mcg  40 mcg Intravenous Q Tue-HD Rexene Agent, MD   40 mcg at 03/16/13 1126  . dextrose (GLUTOSE) 40 % oral gel 37.5  g  1 Tube Oral PRN Birdie Riddle, MD   37.5 g at 03/12/13 2051  . dextrose 10 % infusion   Intravenous Continuous Birdie Riddle, MD 20 mL/hr at 03/14/13 2000    . docusate sodium (COLACE) capsule 100 mg  100 mg Oral BID Birdie Riddle, MD   100 mg at 03/16/13 2119  . ferric gluconate (NULECIT) 25 mg in sodium chloride 0.9 % 50 mL IVPB  25 mg Intravenous Q M,W,F-HD Rexene Agent, MD   25 mg at 03/16/13 1108  . heparin injection 5,000 Units  5,000 Units Subcutaneous Q8H Birdie Riddle, MD   5,000 Units at 03/17/13 0532  . insulin aspart (novoLOG) injection 0-9 Units  0-9 Units Subcutaneous TID WC Birdie Riddle, MD   2 Units at 03/16/13 1741  . isosorbide-hydrALAZINE (BIDIL) 20-37.5 MG per tablet 1 tablet  1 tablet Oral BID Birdie Riddle, MD   1 tablet at 03/16/13 2119  . multivitamin with minerals tablet 1 tablet  1 tablet Oral Daily Birdie Riddle, MD   1 tablet at 03/16/13 1311  . ondansetron (ZOFRAN) tablet 4 mg  4 mg Oral Q6H PRN Birdie Riddle, MD   4 mg at 03/13/13 1326  Or  . ondansetron (ZOFRAN) injection 4 mg  4 mg Intravenous Q6H PRN Birdie Riddle, MD      . pantoprazole (PROTONIX) EC tablet 40 mg  40 mg Oral Daily Birdie Riddle, MD   40 mg at 03/16/13 1311  . sodium chloride 0.9 % injection 3 mL  3 mL Intravenous Q12H Birdie Riddle, MD   3 mL at 03/16/13 2121

## 2013-03-17 NOTE — Care Management Note (Signed)
    Page 1 of 1   03/17/2013     11:16:00 AM   CARE MANAGEMENT NOTE 03/17/2013  Patient:  Timothy Faulkner, Timothy Faulkner   Account Number:  [id number]  Date Initiated:  03/15/2013  Documentation initiated by:  MAYO,HENRIETTA  Subjective/Objective Assessment:   67 yr-old male adm with dx of hypoglycemia; lives with spouse and dtr, has h/o home health services through [place]     Action/Plan:   Anticipated DC Date:     Anticipated DC Plan:        DC Planning Services  CM consult      Choice offered to / List presented to:             Status of service:   Medicare Important Message given?   (If response is "NO", the following Medicare IM given date fields will be blank) Date Medicare IM given:   Date Additional Medicare IM given:    Discharge Disposition:    Per UR Regulation:  Reviewed for med. necessity/level of care/duration of stay  If discussed at [place] of Stay Meetings, dates discussed:   03/17/2013    Comments:  PCP:  Dr Elmarie Shiley  03/17/13 1112 debbie Odel Schmid rn,bsn received call from dr sanford nephrol who wanted [place] to let dr Terrence Dupont know that pt has chair at [facility] dialysis center on [address] on [address] at [place]. pt needs to be at dailysis at 11am. he is set up for mon-wed-fri. spoke w dr Terrence Dupont to let him know that neph ready for disch whenever cardiol feels stable.

## 2013-03-17 NOTE — Progress Notes (Addendum)
Pharmacist Heart Failure Core Measure Documentation  Assessment: Timothy Faulkner has an EF documented as 20-25 on 8/14 by Echo.  Rationale: Heart failure patients with left ventricular systolic dysfunction (LVSD) and an EF < 40% should be prescribed an angiotensin converting enzyme inhibitor (ACEI) or angiotensin receptor blocker (ARB) at discharge unless a contraindication is documented in the medical record.  This patient is not currently on an ACEI or ARB for HF.  This note is being placed in the record in order to provide documentation that a contraindication to the use of these agents is present for this encounter.  ACE Inhibitor or Angiotensin Receptor Blocker is contraindicated (specify all that apply)  []   ACEI allergy AND ARB allergy []   Angioedema []   Moderate or severe aortic stenosis []   Hyperkalemia []   Hypotension []   Renal artery stenosis [x]   Worsening renal function, preexisting renal disease or dysfunction   Tad Moore 03/17/2013 10:43 AM

## 2013-03-18 DIAGNOSIS — N2581 Secondary hyperparathyroidism of renal origin: Secondary | ICD-10-CM | POA: Insufficient documentation

## 2013-03-18 DIAGNOSIS — I12 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 chronic kidney disease or end stage renal disease: Secondary | ICD-10-CM | POA: Insufficient documentation

## 2013-03-18 DIAGNOSIS — I428 Other cardiomyopathies: Secondary | ICD-10-CM | POA: Insufficient documentation

## 2013-03-18 DIAGNOSIS — E119 Type 2 diabetes mellitus without complications: Secondary | ICD-10-CM | POA: Insufficient documentation

## 2013-03-18 NOTE — Discharge Summary (Signed)
NAMEAMYAS, ERDMANN NO.:  [id number]  MEDICAL RECORD NO.:  [id number]  LOCATION:  2617                         FACILITY:  [facility]  PHYSICIAN:  Allegra Lai. Terrence Dupont, M.D. DATE OF BIRTH:  1946/06/09  DATE OF ADMISSION:  03/11/2013 DATE OF DISCHARGE:  03/17/2013                              DISCHARGE SUMMARY   ADMITTING DIAGNOSES: 1. Acute on chronic kidney failure. 2. Status post recurrent hypoglycemic attacks secondary to glipizide     adverse effect. 3. 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 4. Hypertension. 5. Non-insulin-dependent diabetes mellitus. 6. Hypercholesteremia. 7. History of gouty arthritis. 8. History of tobacco abuse. 9. History of alcohol abuse in the past.  DISCHARGE DIAGNOSES: 1. Status post recurrent hypoglycemia. 2. Acute on chronic kidney disease, now stage V, now on hemodialysis. 3. 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 4. Compensated systolic heart failure. 5. Hypertension. 6. Non-insulin-dependent diabetes mellitus. 7. Hypercholesteremia. 8. History of gouty arthritis. 9. History of alcohol and tobacco abuse in the past. 10.Status post hyponatremia. 11.Status post hyperkalemia.  DISCHARGE HOME MEDICATIONS: 1. Enteric-coated aspirin 81 mg 1 tablet daily. 2. Rocaltrol 0.25 mcg 3 times per week. 3. Carvedilol 25 mg twice daily. 4. Colchicine 0.6 mg daily. 5. Digoxin 0.125 mg daily. 6. Vytorin 40 one tablet daily. 7. Glipizide 5 mg 1 tablet daily. 8. BiDil 1 tablet twice daily. 9. Vitamin D 50,000 units 1 capsule every week.  DIET:  Low salt, low cholesterol 1800 calories ADA/renal diet.  The patient has been scheduled for hemodialysis in [facility] Monday, Wednesday, Friday.  Follow up with me in 1 week.  CONDITION ON DISCHARGE:  Stable.  BRIEF HISTORY AND HOSPITAL COURSE:  Mr. Sluis is a 67 year old male with past medical history significant for multiple medical  problems, i.e., nonischemic dilated cardiomyopathy status post ICD in the past, hypertension, history of recurrent congestive heart failure secondary to systolic dysfunction, hypertension, non-insulin-dependent diabetes mellitus, chronic kidney disease, stage IV, history of alcohol abuse, history of tobacco abuse, history of gouty arthritis, hypercholesteremia who was admitted by Dr. Doylene Canard due to recurrent episodes of hypoglycemia.  The patient denies any chest pain or fever or nausea or vomiting.  PAST MEDICAL HISTORY:  As above.  PHYSICAL EXAMINATION:  VITAL SIGNS:  His blood pressure was 176/90, pulse was 63, he was afebrile. GENERAL:  He was alert, awake, oriented to time and place.  He appears to be well developed, well nourished. HEENT:  Normocephalic, atraumatic.  Conjunctivae pink.  PERRLA. NECK:  Supple.  No JVD. LUNGS:  He has no wheezes. CARDIOVASCULAR:  Normal rate and rhythm.  Soft systolic murmur. ABDOMEN:  Soft.  Bowel sounds were present.  No tenderness, no guarding. EXTREMITIES:  There was no clubbing, cyanosis.  There was 2+ edema.  LABORATORY DATA:  His blood sugar was 82.  Repeat blood sugar was 16 then 257.  Repeat blood sugars 71, 34, 52, 60, 56, 55, 46, 38, 54, 31, 56, and subsequently his sugars stayed between 110-164.  His sodium was 124, potassium 5.3, BUN 83, creatinine 7.31.  Hemoglobin was 9.5, hematocrit 28.0, white count of 8.0.  His proBNP was 14,693.  [place]  COURSE:  The patient was admitted to telemetry unit.  The patient received multiple rounds of D50 and was started on D5.  As the patient had recurrent episodes of hypoglycemia, renal consultation was obtained.  Also the patient underwent hemodialysis multiple rounds.  The patient had dialysis catheter placed in right femoral vein initially, which was removed and was placed another catheter in the right internal jugular vein by Interventional Radiology.  The patient did not have  any episodes of chest pain during the hospital stay.  His leg swelling has gradually resolved.  The patient has been scheduled for hemodialysis in [place] on Monday, Wednesday, and Friday.  The patient will be discharged home on above medications and will be followed up in my office in 1 week.  The patient has been advised to monitor blood sugar. If the blood sugar stays below 120, should stop using glipizide for now.     Allegra Lai. Terrence Dupont, M.D.     MNH/MEDQ  D:  03/17/2013  T:  03/18/2013  Job:  [id number]

## 2013-03-30 ENCOUNTER — Ambulatory Visit: Payer: Medicare Other | Admitting: Endocrinology

## 2013-04-15 DIAGNOSIS — E8809 Other disorders of plasma-protein metabolism, not elsewhere classified: Secondary | ICD-10-CM | POA: Insufficient documentation

## 2013-04-21 ENCOUNTER — Ambulatory Visit: Payer: Medicare Other | Admitting: Endocrinology

## 2013-05-05 ENCOUNTER — Ambulatory Visit (INDEPENDENT_AMBULATORY_CARE_PROVIDER_SITE_OTHER): Payer: Medicare Other | Admitting: *Deleted

## 2013-05-05 DIAGNOSIS — I428 Other cardiomyopathies: Secondary | ICD-10-CM

## 2013-05-06 DIAGNOSIS — D509 Iron deficiency anemia, unspecified: Secondary | ICD-10-CM | POA: Insufficient documentation

## 2013-05-09 LAB — REMOTE ICD DEVICE
BRDY-0002RV: 40 {beats}/min
DEVICE MODEL ICD: 268731
HV IMPEDENCE: 53 Ohm

## 2013-05-10 ENCOUNTER — Encounter: Payer: Self-pay | Admitting: *Deleted

## 2013-05-11 ENCOUNTER — Other Ambulatory Visit: Payer: Self-pay

## 2013-05-11 DIAGNOSIS — T82598A Other mechanical complication of other cardiac and vascular devices and implants, initial encounter: Secondary | ICD-10-CM

## 2013-05-14 ENCOUNTER — Observation Stay (HOSPITAL_COMMUNITY)
Admission: EM | Admit: 2013-05-14 | Discharge: 2013-05-15 | Disposition: A | Discharge status: Home / Self Care | Payer: Medicare Other | Attending: Cardiology | Admitting: Cardiology

## 2013-05-14 ENCOUNTER — Encounter (HOSPITAL_COMMUNITY): Payer: Self-pay | Admitting: Physical Medicine and Rehabilitation

## 2013-05-14 ENCOUNTER — Encounter: Payer: Self-pay | Admitting: Internal Medicine

## 2013-05-14 DIAGNOSIS — I12 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 chronic kidney disease or end stage renal disease: Secondary | ICD-10-CM | POA: Insufficient documentation

## 2013-05-14 DIAGNOSIS — E7800 Pure hypercholesterolemia, unspecified: Secondary | ICD-10-CM | POA: Insufficient documentation

## 2013-05-14 DIAGNOSIS — M109 Gout, unspecified: Secondary | ICD-10-CM | POA: Insufficient documentation

## 2013-05-14 DIAGNOSIS — F1011 Alcohol abuse, in remission: Secondary | ICD-10-CM | POA: Insufficient documentation

## 2013-05-14 DIAGNOSIS — I509 Heart failure, unspecified: Secondary | ICD-10-CM | POA: Insufficient documentation

## 2013-05-14 DIAGNOSIS — I5022 Chronic systolic (congestive) heart failure: Secondary | ICD-10-CM | POA: Insufficient documentation

## 2013-05-14 DIAGNOSIS — Z95810 Presence of automatic (implantable) cardiac defibrillator: Secondary | ICD-10-CM | POA: Insufficient documentation

## 2013-05-14 DIAGNOSIS — Z87891 Personal history of nicotine dependence: Secondary | ICD-10-CM | POA: Insufficient documentation

## 2013-05-14 DIAGNOSIS — I428 Other cardiomyopathies: Secondary | ICD-10-CM | POA: Insufficient documentation

## 2013-05-14 DIAGNOSIS — N186 End stage renal disease: Secondary | ICD-10-CM

## 2013-05-14 DIAGNOSIS — E1169 Type 2 diabetes mellitus with other specified complication: Principal | ICD-10-CM | POA: Insufficient documentation

## 2013-05-14 DIAGNOSIS — E162 Hypoglycemia, unspecified: Secondary | ICD-10-CM

## 2013-05-14 DIAGNOSIS — Z992 Dependence on renal dialysis: Secondary | ICD-10-CM | POA: Insufficient documentation

## 2013-05-14 LAB — BASIC METABOLIC PANEL
BUN: 25 mg/dL — ABNORMAL HIGH (ref 6–23)
CO2: 25 mEq/L (ref 19–32)
Calcium: 8.8 mg/dL (ref 8.4–10.5)
Glucose, Bld: 44 mg/dL — CL (ref 70–99)
Potassium: 2.9 mEq/L — ABNORMAL LOW (ref 3.5–5.1)
Sodium: 133 mEq/L — ABNORMAL LOW (ref 135–145)

## 2013-05-14 LAB — COMPREHENSIVE METABOLIC PANEL
AST: 31 U/L (ref 0–37)
Alkaline Phosphatase: 81 U/L (ref 39–117)
BUN: 30 mg/dL — ABNORMAL HIGH (ref 6–23)
CO2: 27 mEq/L (ref 19–32)
Calcium: 8.8 mg/dL (ref 8.4–10.5)
Chloride: 95 mEq/L — ABNORMAL LOW (ref 96–112)
Creatinine, Ser: 3.98 mg/dL — ABNORMAL HIGH (ref 0.50–1.35)
GFR calc Af Amer: 17 mL/min — ABNORMAL LOW (ref >90)
GFR calc non Af Amer: 14 mL/min — ABNORMAL LOW (ref >90)
Glucose, Bld: 114 mg/dL — ABNORMAL HIGH (ref 70–99)
Sodium: 135 mEq/L (ref 135–145)
Total Bilirubin: 0.6 mg/dL (ref 0.3–1.2)

## 2013-05-14 LAB — CBC
Hemoglobin: 10.8 g/dL — ABNORMAL LOW (ref 13.0–17.0)
MCH: 33.2 pg (ref 26.0–34.0)
MCV: 99.7 fL (ref 78.0–100.0)
RBC: 3.25 MIL/uL — ABNORMAL LOW (ref 4.22–5.81)

## 2013-05-14 LAB — GLUCOSE, CAPILLARY: Glucose-Capillary: 145 mg/dL — ABNORMAL HIGH (ref 70–99)

## 2013-05-14 MED ORDER — DIGOXIN 125 MCG PO TABS
0.1250 mg | ORAL_TABLET | Freq: Every day | ORAL | Status: DC
  Administered 2013-05-14 – 2013-05-15 (×2): 0.125 mg via ORAL
  Filled 2013-05-14 (×2): qty 1

## 2013-05-14 MED ORDER — CALCITRIOL 0.25 MCG PO CAPS
0.2500 ug | ORAL_CAPSULE | ORAL | Status: DC
  Filled 2013-05-14: qty 1

## 2013-05-14 MED ORDER — INSULIN ASPART 100 UNIT/ML ~~LOC~~ SOLN
0.0000 [IU] | SUBCUTANEOUS | Status: DC
  Administered 2013-05-14 – 2013-05-15 (×2): 2 [IU] via SUBCUTANEOUS

## 2013-05-14 MED ORDER — ISOSORB DINITRATE-HYDRALAZINE 20-37.5 MG PO TABS
1.0000 | ORAL_TABLET | Freq: Two times a day (BID) | ORAL | Status: DC
  Administered 2013-05-14 – 2013-05-15 (×2): 1 via ORAL
  Filled 2013-05-14 (×3): qty 1

## 2013-05-14 MED ORDER — HEPARIN SODIUM (PORCINE) 5000 UNIT/ML IJ SOLN
5000.0000 [IU] | Freq: Three times a day (TID) | INTRAMUSCULAR | Status: DC
  Administered 2013-05-14 – 2013-05-15 (×2): 5000 [IU] via SUBCUTANEOUS
  Filled 2013-05-14 (×6): qty 1

## 2013-05-14 MED ORDER — VITAMIN D (ERGOCALCIFEROL) 1.25 MG (50000 UNIT) PO CAPS
50000.0000 [IU] | ORAL_CAPSULE | ORAL | Status: DC
  Administered 2013-05-14: 50000 [IU] via ORAL
  Filled 2013-05-14: qty 1

## 2013-05-14 MED ORDER — CARVEDILOL 25 MG PO TABS
25.0000 mg | ORAL_TABLET | Freq: Two times a day (BID) | ORAL | Status: DC
  Administered 2013-05-14 – 2013-05-15 (×2): 25 mg via ORAL
  Filled 2013-05-14 (×4): qty 1

## 2013-05-14 MED ORDER — DEXTROSE 50 % IV SOLN: 1.0000 | Freq: Once | INTRAVENOUS | Status: DC

## 2013-05-14 MED ORDER — DEXTROSE-NACL 5-0.45 % IV SOLN
INTRAVENOUS | Status: DC
  Administered 2013-05-14: 18:00:00 via INTRAVENOUS

## 2013-05-14 MED ORDER — ASPIRIN EC 81 MG PO TBEC
81.0000 mg | DELAYED_RELEASE_TABLET | Freq: Every day | ORAL | Status: DC
  Administered 2013-05-14 – 2013-05-15 (×2): 81 mg via ORAL
  Filled 2013-05-14 (×2): qty 1

## 2013-05-14 MED ORDER — EZETIMIBE-SIMVASTATIN 10-40 MG PO TABS
1.0000 | ORAL_TABLET | Freq: Every day | ORAL | Status: DC
  Administered 2013-05-14: 1 via ORAL
  Filled 2013-05-14 (×2): qty 1

## 2013-05-14 MED ORDER — POTASSIUM CHLORIDE CRYS ER 20 MEQ PO TBCR
40.0000 meq | EXTENDED_RELEASE_TABLET | Freq: Once | ORAL | Status: AC
  Administered 2013-05-14: 40 meq via ORAL
  Filled 2013-05-14: qty 2

## 2013-05-14 MED ORDER — DEXTROSE-NACL 5-0.45 % IV SOLN
INTRAVENOUS | Status: DC
Start: 2013-05-14 — End: 2013-05-14
  Administered 2013-05-14: 14:00:00 via INTRAVENOUS

## 2013-05-14 NOTE — ED Notes (Signed)
Pt presents to department for evaluation of hypoglycemia. Family reports altered mental status and drooling this morning. CBG 42 per EMS. Upon arrival pt is conscious alert and oriented x4. HD patient, receives treatments Monday, Wednesday and Friday. Denies pain.

## 2013-05-14 NOTE — ED Notes (Signed)
Diet tray ordered 

## 2013-05-14 NOTE — ED Notes (Signed)
Pt states difficulty checking blood sugar and keeping it regulated at home. States CBG meter has been broken. Family states he became confused this morning at home. Pt is alert and answering questions appropriately at the time. Denies pain. HD catheter to R upper chest.

## 2013-05-14 NOTE — Progress Notes (Signed)
05/14/13 nsg Patient placed: 1429 ED called: 1434 9 RN not ready claims she will call back ED RN called back: 1444 Arrival to Unit: 1335 To unit 5W alert and oriented patient with ongoing IV. Placed on telemetry per order. No skin issues noted. Oriented to unit set up. Moderate fall risk. Pt able to get up without assist.

## 2013-05-14 NOTE — ED Provider Notes (Signed)
Medical screening examination/treatment/procedure(s) were conducted as a shared visit with non-physician practitioner(s) and myself.  I personally evaluated the patient during the encounter  Patient with persistent hypoglycemia in the emergency department.  He will be started on a dextrose drip and admitted to the hospital.  Overall well-appearing.  Hoy Morn, MD 05/14/13 1218

## 2013-05-14 NOTE — ED Notes (Signed)
Pt was given a meal and peanut butter and apple juice for low POCT CBG.  The tech reported to the RN in charge.

## 2013-05-14 NOTE — ED Provider Notes (Signed)
CSN: [id number]     Arrival date & time 05/14/13  0941 History   First MD Initiated Contact with Patient 05/14/13 [phone]     Chief Complaint  Patient presents with  . Hypoglycemia   (Consider location/radiation/quality/duration/timing/severity/associated sxs/prior Treatment) HPI Comments: Patient with history of ESRD on dialysis Monday, Wednesday, and Friday -- presents after his family called EMS this morning due to altered mental status thought to be due to low blood sugar. CBG was 42 per EMS. Patient was given a peanut butter and jelly sandwich with improvement into 60's. Patient had a hospitalization for persistent low blood sugar due to glipizide several months ago. He last took his diabetes medications yesterday morning (24 hrs ago). States dialysis yesterday proceeded normally. He felt nauseous but has had no other symptoms of illness. The onset of this condition was acute. The course is improving. Aggravating factors: none. Alleviating factors: none.    Patient is a 67 y.o. male presenting with hypoglycemia. The history is provided by the patient and medical records.  Hypoglycemia Associated symptoms: no vomiting     Past Medical History  Diagnosis Date  . HYPERTENSION   . HYPERCHOLESTEROLEMIA, MIXED   . CHF   . DIAB W/O COMP TYPE II/UNS NOT STATED UNCNTRL   . 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 Anemia   . Acute on chronic renal failure     /notes 03/11/2013 (03/11/2013)  . Heart murmur   . Automatic implantable cardioverter-defibrillator in situ     [employer]   Past Surgical History  Procedure Laterality Date  . Incision and drainage abscess / hematoma of bursa / knee / thigh Left 2012    "knee" (03/11/2013)  . Cardiac defibrillator placement      [employer]  . Av fistula placement Right 12/13/2012    Procedure: ARTERIOVENOUS (AV) FISTULA CREATION;  Surgeon: [name], MD;  Location: [facility] OR;  Service: Vascular;  Laterality: Right;  Ultrasound guided    Family History  Problem Relation Age of Onset  . Heart disease Father    History  Substance Use Topics  . Smoking status: Former Smoker -- 0.75 packs/day for 30 years    Types: Cigarettes    Quit date: 11/25/1992  . Smokeless tobacco: Never Used  . Alcohol Use: Yes    Review of Systems  Constitutional: Negative for fever.  HENT: Negative for sore throat and rhinorrhea.   Eyes: Negative for redness.  Respiratory: Negative for cough.   Cardiovascular: Negative for chest pain.  Gastrointestinal: Negative for nausea, vomiting, abdominal pain and diarrhea.  Genitourinary: Negative for dysuria.  Musculoskeletal: Negative for myalgias.  Skin: Negative for rash.  Neurological: Negative for headaches.  Psychiatric/Behavioral: Positive for confusion.    Allergies  Review of patient's allergies indicates no known allergies.  Home Medications   Current Outpatient Rx  Name  Route  Sig  Dispense  Refill  . aspirin EC 81 MG EC tablet   Oral   Take 1 tablet (81 mg total) by mouth daily.   30 tablet   3   . calcitRIOL (ROCALTROL) 0.25 MCG capsule   Oral   Take 0.25 mcg by mouth 3 (three) times a week. Monday, Wednesday and Friday         . carvedilol (COREG) 25 MG tablet   Oral   Take 25 mg by mouth 2 (two) times daily with a meal.           . colchicine 0.6 MG tablet  Oral   Take 0.6 mg by mouth daily as needed (Gout).          Marland Kitchen digoxin (LANOXIN) 0.125 MG tablet   Oral   Take 1 tablet (0.125 mg total) by mouth daily.   30 tablet   3   . furosemide (LASIX) 40 MG tablet   Oral   Take 40 mg by mouth 2 (two) times daily.         Marland Kitchen glipiZIDE (GLUCOTROL XL) 5 MG 24 hr tablet   Oral   Take 5 mg by mouth daily.          Marland Kitchen ibuprofen (ADVIL,MOTRIN) 200 MG tablet   Oral   Take 200 mg by mouth every 6 (six) hours as needed for pain.         . isosorbide-hydrALAZINE (BIDIL) 20-37.5 MG per tablet   Oral   Take 1 tablet by mouth 2 (two) times daily.            . Vitamin D, Ergocalciferol, (DRISDOL) 50000 UNITS CAPS   Oral   Take 50,000 Units by mouth every 7 (seven) days. mondays         . ezetimibe-simvastatin (VYTORIN) 10-40 MG per tablet   Oral   Take 1 tablet by mouth at bedtime.            BP 144/79  Pulse 84  Temp(Src) 97.9 F (36.6 C) (Oral)  Resp 18  SpO2 100% Physical Exam  Nursing note and vitals reviewed. Constitutional: He appears well-developed and well-nourished.  HENT:  Head: Normocephalic and atraumatic.  Eyes: Conjunctivae are normal. Right eye exhibits no discharge. Left eye exhibits no discharge.  Neck: Normal range of motion. Neck supple.  Cardiovascular: Normal rate, regular rhythm and normal heart sounds.   Pulmonary/Chest: Effort normal and breath sounds normal.  Abdominal: Soft. There is no tenderness.  Neurological: He is alert.  Skin: Skin is warm and dry.  RUE fistula  Psychiatric: He has a normal mood and affect.    ED Course  Procedures (including critical care time) Labs Review Labs Reviewed  GLUCOSE, CAPILLARY - Abnormal; Notable for the following:    Glucose-Capillary 67 (*)    All other components within normal limits   Imaging Review No results found.  10:00 AM Patient seen and examined. Work-up initiated. Will give food and monitor CBG.   Vital signs reviewed and are as follows: Filed Vitals:   05/14/13 1002  BP: 166/82  Pulse: 85  Temp: 98.2 F (36.8 C)  Resp: 18   11:58 AM Meal tray given and patient ate everything. CBG was then found to have decreased into 40's. D50/D5W ordered. Will need admission. Dr. Venora Maples has seen. Slightly slow to respond. Daughter now at bedside.   12:10 PM Spoke with [name] who will see.    MDM   1. Hypoglycemia   2. ESRD (end stage renal disease)    Persistent hypoglycemia despite PO intake in ED. Will need monitoring.     Carlisle Cater, PA-C 05/14/13 [phone]

## 2013-05-14 NOTE — H&P (Signed)
Timothy Faulkner is an 67 y.o. male.   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 Patient states he has not been checking his blood sugar. In the ER patient was noted to have blood sugar of 40 received D50 and with improvement in blood sugar in high 60s and then repeat blood sugar drop in blood sugar to 37. Patient had similar presentation approximately 2 months ago requiring admission and D5W and multiple rounds of D50. Patient states he has been taking glipizide regularly 5 mg daily patient was advised to stop glipizide if his blood sugar stays below 120 but states his meter was not working and has not been checking his blood sugar.  Past Medical History  Diagnosis Date  . HYPERTENSION   . HYPERCHOLESTEROLEMIA, MIXED   . CHF   . DIAB W/O COMP TYPE II/UNS NOT STATED UNCNTRL   . 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 Anemia   . Acute on chronic renal failure     /notes 03/11/2013 (03/11/2013)  . Heart murmur   . Automatic implantable cardioverter-defibrillator in situ     [employer]    Past Surgical History  Procedure Laterality Date  . Incision and drainage abscess / hematoma of bursa / knee / thigh Left 2012    "knee" (03/11/2013)  . Cardiac defibrillator placement      [employer]  . Av fistula placement Right 12/13/2012    Procedure: ARTERIOVENOUS  (AV) FISTULA CREATION;  Surgeon: [name], MD;  Location: [facility] OR;  Service: Vascular;  Laterality: Right;  Ultrasound guided    Family History  Problem Relation Age of Onset  . Heart disease Father    Social History:  reports that he quit smoking about 20 years ago. His smoking use included Cigarettes. He has a 22.5 pack-year smoking history. He has never used smokeless tobacco. He reports that  drinks alcohol. He reports that he does not use illicit drugs.  Allergies: No Known Allergies   (Not in a hospital admission)  Results for orders placed during the hospital encounter of 05/14/13 (from the past 48 hour(s))  GLUCOSE, CAPILLARY     Status: Abnormal   Collection Time    05/14/13  9:50 AM      Result Value Range   Glucose-Capillary 67 (*) 70 - 99 mg/dL   Comment 1 Notify RN     Comment 2 Documented in Chart    BASIC METABOLIC PANEL     Status: Abnormal   Collection Time    05/14/13 11:00 AM      Result Value Range   Sodium 133 (*) 135 - 145 mEq/L   Potassium 2.9 (*) 3.5 - 5.1 mEq/L   Chloride 96  96 - 112 mEq/L   CO2 25  19 - 32 mEq/L   Glucose, Bld 44 (*) 70 - 99 mg/dL   Comment: CRITICAL RESULT CALLED TO, READ BACK BY AND VERIFIED WITH:     MUEHLERKRN [id number]  MCCAULEG   BUN 25 (*) 6 - 23 mg/dL   Creatinine, Ser 3.51 (*) 0.50 - 1.35 mg/dL   Calcium 8.8  8.4 - 10.5 mg/dL   GFR calc non Af Amer 17 (*) >90 mL/min   GFR calc Af Amer 19 (*) >90 mL/min   Comment: (NOTE)     The eGFR has been calculated using the CKD EPI equation.     This calculation has not been validated in all clinical situations.     eGFR's persistently <90 mL/min signify possible Chronic Kidney     Disease.  CBC     Status: Abnormal   Collection Time    05/14/13 11:00 AM      Result Value Range   WBC 6.3  4.0 - 10.5 K/uL   RBC 3.25 (*) 4.22 - 5.81 MIL/uL   Hemoglobin 10.8 (*) 13.0 - 17.0 g/dL   HCT 32.4 (*) 39.0 - 52.0 %   MCV 99.7  78.0 - 100.0 fL   MCH 33.2  26.0 - 34.0 pg   MCHC 33.3   30.0 - 36.0 g/dL   RDW 15.2  11.5 - 15.5 %   Platelets 164  150 - 400 K/uL  GLUCOSE, CAPILLARY     Status: Abnormal   Collection Time    05/14/13 11:48 AM      Result Value Range   Glucose-Capillary 37 (*) 70 - 99 mg/dL  GLUCOSE, CAPILLARY     Status: None   Collection Time    05/14/13 12:42 PM      Result Value Range   Glucose-Capillary 79  70 - 99 mg/dL   No results found.  Review of Systems  Constitutional: Negative for fever and chills.  Eyes: Negative for blurred vision, double vision and photophobia.  Respiratory: Negative for cough, hemoptysis, sputum production and shortness of breath.   Cardiovascular: Negative for chest pain, palpitations and orthopnea.  Gastrointestinal: Negative for heartburn, abdominal pain and diarrhea.  Genitourinary: Negative for dysuria and urgency.  Musculoskeletal: Negative for myalgias.  Skin: Negative for rash.  Neurological: Negative for dizziness, tingling, tremors, focal weakness, seizures and headaches.    Blood pressure 171/77, pulse 91, temperature 97.9 F (36.6 C), temperature source Oral, resp. rate 18, SpO2 100.00%. Physical Exam  Constitutional: He is oriented to person, place, and time.  HENT:  Head: Normocephalic and atraumatic.  Eyes: Conjunctivae are normal. Left eye exhibits no discharge. No scleral icterus.  Neck: Normal range of motion. Neck supple. No JVD present. No tracheal deviation present. No thyromegaly present.  Cardiovascular: Normal rate and regular rhythm.   Murmur (Soft systolic murmur noted no S3 gallop) heard. Respiratory: Effort normal and breath sounds normal. No respiratory distress. He has no wheezes. He has no rales.  GI: Soft. Bowel sounds are normal. He exhibits no distension. There is no tenderness. There is no rebound and no guarding.  Musculoskeletal:  No clubbing cyanosis trace edema noted  Neurological: He is alert and oriented to person, place, and time.     Assessment/Plan Status post  mental status secondary to severe hypoglycemia Recurrent persistent hypoglycemia Nonischemic dilated myopathy status post ICD in the past Compensated systolic heart failure Uncontrolled hypertension Non-insulin-dependent diabetes matters Hypercholesteremia History of gouty arthritis End-stage renal disease on hemodialysis History of alcohol and tobacco abuse in the past Plan As per orders Monitor 24 hours if blood sugar stays above 100 discharge home tomorrow. DC glipizide Yaritzy Huser N 05/14/2013, 1:53 PM

## 2013-05-15 LAB — BASIC METABOLIC PANEL
BUN: 40 mg/dL — ABNORMAL HIGH (ref 6–23)
Calcium: 8.4 mg/dL (ref 8.4–10.5)
Creatinine, Ser: 4.86 mg/dL — ABNORMAL HIGH (ref 0.50–1.35)
GFR calc Af Amer: 13 mL/min — ABNORMAL LOW (ref >90)
GFR calc non Af Amer: 11 mL/min — ABNORMAL LOW (ref >90)
Glucose, Bld: 85 mg/dL (ref 70–99)
Potassium: 3.4 mEq/L — ABNORMAL LOW (ref 3.5–5.1)
Sodium: 132 mEq/L — ABNORMAL LOW (ref 135–145)

## 2013-05-15 LAB — HEMOGLOBIN A1C: Mean Plasma Glucose: 94 mg/dL (ref <117)

## 2013-05-15 LAB — GLUCOSE, CAPILLARY
Glucose-Capillary: 81 mg/dL (ref 70–99)
Glucose-Capillary: 139 mg/dL — ABNORMAL HIGH (ref 70–99)
Glucose-Capillary: 121 mg/dL — ABNORMAL HIGH (ref 70–99)

## 2013-05-15 MED ORDER — ALUM & MAG HYDROXIDE-SIMETH 200-200-20 MG/5ML PO SUSP: 30.0000 mL | Freq: Three times a day (TID) | ORAL | Status: DC | PRN

## 2013-05-15 MED ORDER — INSULIN ASPART 100 UNIT/ML ~~LOC~~ SOLN: 0.0000 [IU] | SUBCUTANEOUS | Status: DC

## 2013-05-15 NOTE — Discharge Summary (Signed)
  Discharge summary dictated on 05/15/2013 dictation number is [phone]

## 2013-05-15 NOTE — Discharge Summary (Signed)
NAMERETT, SINGLETON NO.:  [id number]  MEDICAL RECORD NO.:  [id number]  LOCATION:  5W23C                        FACILITY:  [facility]  PHYSICIAN:  Doneen Ollinger N. Terrence Dupont, M.D. DATE OF BIRTH:  Jun 16, 1946  DATE OF ADMISSION:  05/14/2013 DATE OF DISCHARGE:  05/15/2013                              DISCHARGE SUMMARY   ADMITTING DIAGNOSES: 1. Status post acute mental status changes secondary to severe     hypoglycemia. 2. Recurrent persistent hypoglycemia. 3. 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 4. Compensated systolic heart failure. 5. Uncontrolled hypertension. 6. Non-insulin-dependent diabetes mellitus. 7. Hypercholesteremia. 8. History of gouty arthritis. 9. End-stage renal disease, on hemodialysis. 10.History of alcohol and tobacco abuse in the past.  DISCHARGE DIAGNOSES: 1. Status post AMS secondary to severe hypoglycemia status post     recurrent hypoglycemia. 2. 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 3. Compensated systolic heart failure. 4. Hypertension. 5. Non-insulin-dependent diabetes mellitus, off all medications for     now. 6. Hypercholesteremia. 7. History of gouty arthritis. 8. End-stage renal disease, on hemodialysis. 9. History of alcohol and tobacco abuse.  DISCHARGE HOME MEDICATIONS:  Aspirin 81 mg 1 tablet daily, calcitriol 0.25 mcg 1 capsule 3 times daily 3 times per week as before, carvedilol 25 mg 1 tablet twice daily, colchicine 0.6 mg daily, digoxin 0.125 mg 1 tablet daily, Vytorin 10/40 one tablet daily at bedtime, BiDil 1 tablet twice daily, Vitamin D 50,000 units capsule every week as before.  The patient has been advised to stop furosemide, stop glipizide, and stop ibuprofen.  The patient has been advised to monitor blood sugar twice daily and chart.  Follow up with me in 1 week.  Continue hemodialysis as before on Monday, Wednesday, Friday.  CONDITION AT DISCHARGE:   Stable.  Blood sugar has been ranged between 80-140.  The patient is off D5W for last 2 hours.  BRIEF HISTORY AND HOSPITAL COURSE:  Mr. 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  He came to the ER via EMS as patient's family noted him to be confused, they call 911 as was noted to have blood sugar of 42.  The patient states he has been feeling weak and sweaty for last 2 days off and on.  The patient states he has not been checking his blood sugars.  In the ER, the patient was noted to have blood sugar of 40, received D50 with improvement in blood sugar in high 60s and then repeat blood sugar dropped to 37.  The patient had similar presentation approximately 2 months ago requiring admission and D5W and multiple rounds of D50.  The patient states he has been taking his glipizide regularly 5 mg daily but not monitoring his blood sugar.  The patient was advised to stop glipizide if his blood sugar stays below 120 but states his meter was  not working and was not checking his blood sugar.  PAST MEDICAL HISTORY:  As above.  PHYSICAL EXAMINATION:  GENERAL:  He was alert, awake, oriented. VITAL SIGNS:  When seen in the ER, blood pressure was 171/77, pulse was 91.  He was afebrile. HEENT:  Conjunctivae was pink. NECK:  Supple.  No JVD.  No bruit. LUNGS:  Clear to auscultation without rhonchi or rales. CARDIOVASCULAR:  S1, S2 was normal.  There was soft systolic murmur.  No S3 gallop. ABDOMEN:  Soft.  Bowel sounds were present.  Nontender. EXTREMITIES:  There is no clubbing,  cyanosis.  There was trace edema. NEUROLOGIC:  Grossly intact.  LABORATORY DATA:  Sodium was 133, potassium 2.9, BUN 25, creatinine 3.51.  Blood sugar was 44.  Repeat blood sugar was 114, then 85. Electrolytes this morning, sodium 132, potassium 3.4, BUN 40, creatinine 4.86.  Hemoglobin was 10.8, hematocrit 32.4, white count of 6.3.  The patient had CBGs since yesterday, CBGs 37, 79, 110, 148, 145, 134, 92, 81, 139, and repeat now is 121.  The patient is off D5 for last 2 hours and patient is eating well.  BRIEF HOSPITAL COURSE:  The patient was admitted to telemetry unit.  The patient was started on half-normal D5W at 50 mL/h.  His potassium was replaced and his blood sugar was monitored initially q.2 hours and then every 4 hours.  The patient did not have any further episodes of hypoglycemia in last 12 hours.  The patient is up about walking in the room and is eager to go home.  The patient will be discharged home on above medications and will be followed up in my office in 1 week.     Allegra Lai. Terrence Dupont, M.D.     MNH/MEDQ  D:  05/15/2013  T:  05/15/2013  Job:  [id number]

## 2013-05-15 NOTE — Progress Notes (Signed)
1249 05/15/13 nsg Patient discharge to home; understood discharge instructions.

## 2013-05-16 DIAGNOSIS — D689 Coagulation defect, unspecified: Secondary | ICD-10-CM | POA: Insufficient documentation

## 2013-05-23 ENCOUNTER — Encounter: Payer: Self-pay | Admitting: Vascular Surgery

## 2013-05-24 ENCOUNTER — Ambulatory Visit (INDEPENDENT_AMBULATORY_CARE_PROVIDER_SITE_OTHER): Payer: Medicare Other | Admitting: Vascular Surgery

## 2013-05-24 ENCOUNTER — Ambulatory Visit (HOSPITAL_COMMUNITY)
Admission: RE | Admit: 2013-05-24 | Discharge: 2013-05-24 | Disposition: A | Discharge status: Home / Self Care | Payer: Medicare Other | Source: Ambulatory Visit | Attending: Vascular Surgery | Admitting: Vascular Surgery

## 2013-05-24 ENCOUNTER — Other Ambulatory Visit: Payer: Self-pay

## 2013-05-24 ENCOUNTER — Encounter: Payer: Self-pay | Admitting: Vascular Surgery

## 2013-05-24 VITALS — BP 158/86 | HR 69 | Resp 18 | Ht 68.0 in | Wt 186.0 lb

## 2013-05-24 DIAGNOSIS — Y849 Medical procedure, unspecified as the cause of abnormal reaction of the patient, or of later complication, without mention of misadventure at the time of the procedure: Secondary | ICD-10-CM | POA: Insufficient documentation

## 2013-05-24 DIAGNOSIS — T82598A Other mechanical complication of other cardiac and vascular devices and implants, initial encounter: Secondary | ICD-10-CM | POA: Insufficient documentation

## 2013-05-24 DIAGNOSIS — N186 End stage renal disease: Secondary | ICD-10-CM

## 2013-05-24 NOTE — Progress Notes (Signed)
The patient presents today for evaluation of his right upper arm AV fistula. He is well known to me from prior AV fistula creation by myself on 12/13/2012. He is currently being dialyzed via a subclavian hemodialysis catheter. He did have attempted cannulization of his fistula and had failure with some clotting of the line. He is here today for further evaluation.  Past Medical History  Diagnosis Date  . HYPERTENSION   . HYPERCHOLESTEROLEMIA, MIXED   . CHF   . DIAB W/O COMP TYPE II/UNS NOT STATED UNCNTRL   . 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 Anemia   . Acute on chronic renal failure     /notes 03/11/2013 (03/11/2013)  . Heart murmur   . Automatic implantable cardioverter-defibrillator in situ     [employer]    History  Substance Use Topics  . Smoking status: Former Smoker -- 0.75 packs/day for 30 years    Types: Cigarettes    Quit date: 11/25/1992  . Smokeless tobacco: Never Used  . Alcohol Use: Yes    Family History  Problem Relation Age of Onset  . Heart disease Father     No Known Allergies  Current outpatient prescriptions:aspirin EC 81 MG EC tablet, Take 1 tablet (81 mg total) by mouth daily., Disp: 30 tablet, Rfl: 3;  carvedilol (COREG) 25 MG tablet, Take 25 mg by mouth 2 (two) times daily with a meal.  , Disp: , Rfl: ;  colchicine 0.6 MG tablet, Take 0.6 mg by mouth daily as needed (Gout). , Disp: , Rfl: ;  digoxin (LANOXIN) 0.125 MG tablet, Take 1 tablet (0.125 mg total) by mouth daily., Disp: 30 tablet, Rfl: 3 ezetimibe-simvastatin (VYTORIN) 10-40 MG per tablet, Take 1 tablet by mouth at bedtime.  , Disp: , Rfl: ;  isosorbide-hydrALAZINE (BIDIL) 20-37.5 MG per tablet, Take 1 tablet by mouth 2 (two) times daily.  , Disp: , Rfl: ;  Vitamin D, Ergocalciferol, (DRISDOL) 50000 UNITS CAPS, Take 50,000 Units by mouth every 7 (seven) days. mondays, Disp: , Rfl:  calcitRIOL (ROCALTROL) 0.25 MCG capsule, Take 0.25 mcg by mouth 3 (three) times a week. Monday, Wednesday  and Friday, Disp: , Rfl:   BP 158/86  Pulse 69  Resp 18  Ht 5\' 8"  (1.727 m)  Wt 186 lb (84.369 kg)  BMI 28.29 kg/m2  Body mass index is 28.29 kg/(m^2).       On physical exam he does have very nicely develop fistulas right upper arm. It is more pulsatile above the antecubital space and then significant better thrill above this area in the upper arm. The size appears to be quite good ranging from the 0.75-1 cm throughout its course. Areas some infiltration and thickening in the mid upper arm.  Venous duplex today was ordered and reviewed by myself. This does show some elevated velocities at the anastomosis which may be related to the angle of the anastomosis. Of more concern there is an area of webbing and either hematoma or vein cusp in the mid upper arm with velocities in 585 cm/s. This corresponds with the change in caliber from the pulse below and a thrill of  Impression and plan: Poorly functioning right upper arm AV fistula. Duplex imaging shows excellent size maturation and the vein is very superficial. I feel that this has a very good chance of eventually becoming adequate for dialysis. I have recommended a shuntogram and possible angioplasty to improve flow. He dialyzes on Monday Wednesday and Friday and we will schedule this for  Tuesday at his convenience as an outpatient

## 2013-05-31 ENCOUNTER — Ambulatory Visit (HOSPITAL_COMMUNITY)
Admission: RE | Admit: 2013-05-31 | Discharge: 2013-05-31 | Disposition: A | Discharge status: Home / Self Care | Payer: Medicare Other | Source: Ambulatory Visit | Attending: Surgery | Admitting: Surgery

## 2013-05-31 ENCOUNTER — Encounter (HOSPITAL_COMMUNITY)
Admission: RE | Disposition: A | Discharge status: Home / Self Care | Payer: Self-pay | Source: Ambulatory Visit | Attending: Surgery

## 2013-05-31 DIAGNOSIS — Z79899 Other long term (current) drug therapy: Secondary | ICD-10-CM | POA: Insufficient documentation

## 2013-05-31 DIAGNOSIS — I12 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 chronic kidney disease or end stage renal disease: Secondary | ICD-10-CM | POA: Insufficient documentation

## 2013-05-31 DIAGNOSIS — N189 Chronic kidney disease, unspecified: Secondary | ICD-10-CM | POA: Insufficient documentation

## 2013-05-31 DIAGNOSIS — I509 Heart failure, unspecified: Secondary | ICD-10-CM | POA: Insufficient documentation

## 2013-05-31 DIAGNOSIS — D649 Anemia, unspecified: Secondary | ICD-10-CM | POA: Insufficient documentation

## 2013-05-31 DIAGNOSIS — Z7982 Long term (current) use of aspirin: Secondary | ICD-10-CM | POA: Insufficient documentation

## 2013-05-31 DIAGNOSIS — Z992 Dependence on renal dialysis: Secondary | ICD-10-CM | POA: Insufficient documentation

## 2013-05-31 DIAGNOSIS — I428 Other cardiomyopathies: Secondary | ICD-10-CM | POA: Insufficient documentation

## 2013-05-31 DIAGNOSIS — T82898A Other specified complication of vascular prosthetic devices, implants and grafts, initial encounter: Secondary | ICD-10-CM

## 2013-05-31 DIAGNOSIS — Z87891 Personal history of nicotine dependence: Secondary | ICD-10-CM | POA: Insufficient documentation

## 2013-05-31 DIAGNOSIS — Y832 Surgical operation with anastomosis, bypass or graft as the cause of abnormal reaction of the patient, or of later complication, without mention of misadventure at the time of the procedure: Secondary | ICD-10-CM | POA: Insufficient documentation

## 2013-05-31 DIAGNOSIS — Z95810 Presence of automatic (implantable) cardiac defibrillator: Secondary | ICD-10-CM | POA: Insufficient documentation

## 2013-05-31 DIAGNOSIS — N186 End stage renal disease: Secondary | ICD-10-CM | POA: Insufficient documentation

## 2013-05-31 DIAGNOSIS — E119 Type 2 diabetes mellitus without complications: Secondary | ICD-10-CM | POA: Insufficient documentation

## 2013-05-31 DIAGNOSIS — N179 Acute kidney failure, unspecified: Secondary | ICD-10-CM | POA: Insufficient documentation

## 2013-05-31 DIAGNOSIS — E7800 Pure hypercholesterolemia, unspecified: Secondary | ICD-10-CM | POA: Insufficient documentation

## 2013-05-31 HISTORY — PX: SHUNTOGRAM: SHX5491

## 2013-05-31 LAB — POCT I-STAT, CHEM 8
BUN: 24 mg/dL — ABNORMAL HIGH (ref 6–23)
BUN: 25 mg/dL — ABNORMAL HIGH (ref 6–23)
Chloride: 101 mEq/L (ref 96–112)
Chloride: 101 mEq/L (ref 96–112)
Creatinine, Ser: 5.1 mg/dL — ABNORMAL HIGH (ref 0.50–1.35)
Creatinine, Ser: 5.1 mg/dL — ABNORMAL HIGH (ref 0.50–1.35)
Glucose, Bld: 76 mg/dL (ref 70–99)
Hemoglobin: 10.2 g/dL — ABNORMAL LOW (ref 13.0–17.0)
Potassium: 2.9 mEq/L — ABNORMAL LOW (ref 3.5–5.1)
Potassium: 2.9 mEq/L — ABNORMAL LOW (ref 3.5–5.1)
Sodium: 140 mEq/L (ref 135–145)
Sodium: 141 mEq/L (ref 135–145)
TCO2: 26 mmol/L (ref 0–100)
TCO2: 27 mmol/L (ref 0–100)

## 2013-05-31 SURGERY — ASSESSMENT, SHUNT FUNCTION, WITH CONTRAST RADIOGRAPHIC STUDY
Anesthesia: LOCAL

## 2013-05-31 MED ORDER — ONDANSETRON HCL 4 MG/2ML IJ SOLN: 4.0000 mg | Freq: Four times a day (QID) | INTRAMUSCULAR | Status: DC | PRN

## 2013-05-31 MED ORDER — HYDRALAZINE HCL 20 MG/ML IJ SOLN: 10.0000 mg | INTRAMUSCULAR | Status: DC | PRN

## 2013-05-31 MED ORDER — METOPROLOL TARTRATE 1 MG/ML IV SOLN: 2.0000 mg | INTRAVENOUS | Status: DC | PRN

## 2013-05-31 MED ORDER — OXYCODONE HCL 5 MG PO TABS: 5.0000 mg | ORAL_TABLET | ORAL | Status: DC | PRN

## 2013-05-31 MED ORDER — LIDOCAINE HCL (PF) 1 % IJ SOLN
INTRAMUSCULAR | Status: AC
  Filled 2013-05-31: qty 30

## 2013-05-31 MED ORDER — PHENOL 1.4 % MT LIQD: 1.0000 | OROMUCOSAL | Status: DC | PRN

## 2013-05-31 MED ORDER — GUAIFENESIN-DM 100-10 MG/5ML PO SYRP: 15.0000 mL | ORAL_SOLUTION | ORAL | Status: DC | PRN

## 2013-05-31 MED ORDER — SODIUM CHLORIDE 0.9 % IJ SOLN: 3.0000 mL | INTRAMUSCULAR | Status: DC | PRN

## 2013-05-31 MED ORDER — LABETALOL HCL 5 MG/ML IV SOLN: 10.0000 mg | INTRAVENOUS | Status: DC | PRN

## 2013-05-31 MED ORDER — ACETAMINOPHEN 325 MG PO TABS: 325.0000 mg | ORAL_TABLET | ORAL | Status: DC | PRN

## 2013-05-31 MED ORDER — ACETAMINOPHEN 325 MG RE SUPP: 325.0000 mg | RECTAL | Status: DC | PRN

## 2013-05-31 NOTE — Progress Notes (Signed)
Notified Dr Trula Slade that K was 2.9. No new orders

## 2013-05-31 NOTE — Op Note (Signed)
[facility]  Patient name: Timothy Faulkner MRN: [id number] DOB: 16-May-1946 Sex: male  05/31/2013 Pre-operative Diagnosis: Poorly functioning right upper arm fistula Post-operative diagnosis:  Same Surgeon:  Eldridge Abrahams Procedure Performed:  1.  ultrasound-guided access, right cephalic  2.   fistulogram  3.  angioplasty x2, right cephalic vein  4.  followup x1    Indications:  The patient has a right upper arm fistula which has been difficult to cannulate.  Ultrasound identified an area in the midportion that appeared to be stenotic.  He comes in for further evaluation, intervention.  Procedure:  The patient was identified in the holding area and taken to room 8.  The patient was then placed supine on the table and prepped and draped in the usual sterile fashion.  A time out was called.  Ultrasound was used to evaluate the fistula.  The vein was patent and compressible.  A digital ultrasound image was acquired.  The fistula was then accessed under ultrasound guidance using a micropuncture needle.  An 018 wire was then asvanced without resistance and a micropuncture sheath was placed.  Contrast injections were then performed through the sheath.  Findings:  The arterial venous anastomosis is widely patent.  The central venous system is widely patent.  There is an area in the proximal fistula which correlates to approximately an 80% stenosis.  This is associated with a luminal outpouching.   Intervention:  After the above images were acquired, the decision was made to proceed with intervention.  A 7 French sheath was inserted.  I selected initially a 7 x 40 Mustang balloon.  Primary balloon angioplasty was performed at the area of stenosis.  Completion arteriogram revealed improved a suboptimal result, therefore I elected to up size to a 9 x 40 Mustang balloon.  The balloon was taken to 20 atmospheres for 1 minute.  Completion arteriogram revealed significantly  improved results across the area of luminal narrowing.  At this time I observed an area in the morning distal fistula which appeared to have narrowed down.  I elected to use the 9 x 40 balloon to treat this area in the more proximal cephalic vein.  The balloon was taken to 8 atmospheres and held up for 1 minute.  Completion arteriogram revealed significantly improved blood flow across this area.  At the conclusion of the procedure there was an excellent thrill within the fistula.  The patient's cannulation site was closed using a 2-0 nylon suture and a plastic bumper.  Impression:  #1  successful balloon angioplasty of an area of high-grade stenosis using a 9 x 40 balloon.  #2  a second area within the more proximal fistula appeared to be significant at the end of the procedure.  This area was also treated with a 9 x 40 balloon.  #3  the arteriovenous anastomosis is widely patent.  #4  there is no evidence of central venous stenosis.  #5  the access site should be ready for utilization   V. Annamarie Major, M.D. [facility] Office: [phone] Pager:  [phone]

## 2013-05-31 NOTE — Interval H&P Note (Signed)
History and Physical Interval Note:  05/31/2013 9:21 AM  Timothy Faulkner  has presented today for surgery, with the diagnosis of End stage renal  The various methods of treatment have been discussed with the patient and family. After consideration of risks, benefits and other options for treatment, the patient has consented to  Procedure(s): SHUNTOGRAM (N/A) as a surgical intervention .  The patient's history has been reviewed, patient examined, no change in status, stable for surgery.  I have reviewed the patient's chart and labs.  Questions were answered to the patient's satisfaction.     BRABHAM IV, V. WELLS

## 2013-05-31 NOTE — H&P (View-Only) (Signed)
The patient presents today for evaluation of his right upper arm AV fistula. He is well known to me from prior AV fistula creation by myself on 12/13/2012. He is currently being dialyzed via a subclavian hemodialysis catheter. He did have attempted cannulization of his fistula and had failure with some clotting of the line. He is here today for further evaluation.  Past Medical History  Diagnosis Date  . HYPERTENSION   . HYPERCHOLESTEROLEMIA, MIXED   . CHF   . DIAB W/O COMP TYPE II/UNS NOT STATED UNCNTRL   . 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 Anemia   . Acute on chronic renal failure     /notes 03/11/2013 (03/11/2013)  . Heart murmur   . Automatic implantable cardioverter-defibrillator in situ     [employer]    History  Substance Use Topics  . Smoking status: Former Smoker -- 0.75 packs/day for 30 years    Types: Cigarettes    Quit date: 11/25/1992  . Smokeless tobacco: Never Used  . Alcohol Use: Yes    Family History  Problem Relation Age of Onset  . Heart disease Father     No Known Allergies  Current outpatient prescriptions:aspirin EC 81 MG EC tablet, Take 1 tablet (81 mg total) by mouth daily., Disp: 30 tablet, Rfl: 3;  carvedilol (COREG) 25 MG tablet, Take 25 mg by mouth 2 (two) times daily with a meal.  , Disp: , Rfl: ;  colchicine 0.6 MG tablet, Take 0.6 mg by mouth daily as needed (Gout). , Disp: , Rfl: ;  digoxin (LANOXIN) 0.125 MG tablet, Take 1 tablet (0.125 mg total) by mouth daily., Disp: 30 tablet, Rfl: 3 ezetimibe-simvastatin (VYTORIN) 10-40 MG per tablet, Take 1 tablet by mouth at bedtime.  , Disp: , Rfl: ;  isosorbide-hydrALAZINE (BIDIL) 20-37.5 MG per tablet, Take 1 tablet by mouth 2 (two) times daily.  , Disp: , Rfl: ;  Vitamin D, Ergocalciferol, (DRISDOL) 50000 UNITS CAPS, Take 50,000 Units by mouth every 7 (seven) days. mondays, Disp: , Rfl:  calcitRIOL (ROCALTROL) 0.25 MCG capsule, Take 0.25 mcg by mouth 3 (three) times a week. Monday, Wednesday  and Friday, Disp: , Rfl:   BP 158/86  Pulse 69  Resp 18  Ht 5\' 8"  (1.727 m)  Wt 186 lb (84.369 kg)  BMI 28.29 kg/m2  Body mass index is 28.29 kg/(m^2).       On physical exam he does have very nicely develop fistulas right upper arm. It is more pulsatile above the antecubital space and then significant better thrill above this area in the upper arm. The size appears to be quite good ranging from the 0.75-1 cm throughout its course. Areas some infiltration and thickening in the mid upper arm.  Venous duplex today was ordered and reviewed by myself. This does show some elevated velocities at the anastomosis which may be related to the angle of the anastomosis. Of more concern there is an area of webbing and either hematoma or vein cusp in the mid upper arm with velocities in 585 cm/s. This corresponds with the change in caliber from the pulse below and a thrill of  Impression and plan: Poorly functioning right upper arm AV fistula. Duplex imaging shows excellent size maturation and the vein is very superficial. I feel that this has a very good chance of eventually becoming adequate for dialysis. I have recommended a shuntogram and possible angioplasty to improve flow. He dialyzes on Monday Wednesday and Friday and we will schedule this for  Tuesday at his convenience as an outpatient

## 2013-06-30 ENCOUNTER — Other Ambulatory Visit (HOSPITAL_COMMUNITY): Payer: Self-pay | Admitting: Cardiology

## 2013-06-30 DIAGNOSIS — R079 Chest pain, unspecified: Secondary | ICD-10-CM

## 2013-07-11 ENCOUNTER — Encounter: Payer: Medicare Other | Admitting: Internal Medicine

## 2013-07-12 ENCOUNTER — Encounter (HOSPITAL_COMMUNITY)
Admission: RE | Admit: 2013-07-12 | Discharge: 2013-07-12 | Disposition: A | Discharge status: Home / Self Care | Payer: Medicare Other | Source: Ambulatory Visit | Attending: Cardiology | Admitting: Cardiology

## 2013-07-12 ENCOUNTER — Other Ambulatory Visit: Payer: Self-pay

## 2013-07-12 DIAGNOSIS — R079 Chest pain, unspecified: Secondary | ICD-10-CM

## 2013-07-12 DIAGNOSIS — Z87891 Personal history of nicotine dependence: Secondary | ICD-10-CM | POA: Insufficient documentation

## 2013-07-12 MED ORDER — REGADENOSON 0.4 MG/5ML IV SOLN
0.4000 mg | Freq: Once | INTRAVENOUS | Status: AC
  Administered 2013-07-12: 0.4 mg via INTRAVENOUS

## 2013-07-12 MED ORDER — REGADENOSON 0.4 MG/5ML IV SOLN
INTRAVENOUS | Status: AC
  Administered 2013-07-12: 0.4 mg via INTRAVENOUS
  Filled 2013-07-12: qty 5

## 2013-07-12 MED ORDER — TECHNETIUM TC 99M SESTAMIBI GENERIC - CARDIOLITE
10.0000 | Freq: Once | INTRAVENOUS | Status: AC | PRN
  Administered 2013-07-12: 10 via INTRAVENOUS

## 2013-07-12 MED ORDER — TECHNETIUM TC 99M SESTAMIBI GENERIC - CARDIOLITE
30.0000 | Freq: Once | INTRAVENOUS | Status: AC | PRN
  Administered 2013-07-12: 30 via INTRAVENOUS

## 2013-07-19 ENCOUNTER — Ambulatory Visit (INDEPENDENT_AMBULATORY_CARE_PROVIDER_SITE_OTHER): Payer: Medicare Other | Admitting: Cardiology

## 2013-07-19 ENCOUNTER — Encounter: Payer: Self-pay | Admitting: Internal Medicine

## 2013-07-19 ENCOUNTER — Encounter: Payer: Self-pay | Admitting: Cardiology

## 2013-07-19 VITALS — BP 118/70 | HR 67 | Wt 189.0 lb

## 2013-07-19 DIAGNOSIS — I428 Other cardiomyopathies: Secondary | ICD-10-CM

## 2013-07-19 DIAGNOSIS — Z95810 Presence of automatic (implantable) cardiac defibrillator: Secondary | ICD-10-CM

## 2013-07-19 DIAGNOSIS — I5022 Chronic systolic (congestive) heart failure: Secondary | ICD-10-CM

## 2013-07-19 DIAGNOSIS — I509 Heart failure, unspecified: Secondary | ICD-10-CM

## 2013-07-19 DIAGNOSIS — I10 Essential (primary) hypertension: Secondary | ICD-10-CM

## 2013-07-19 NOTE — Progress Notes (Signed)
ELECTROPHYSIOLOGY OFFICE NOTE  Patient ID: KHAOS LADUCER MRN: [id number], DOB/AGE: Jun 23, 1946   Date of Visit: 07/19/2013  Primary Physician: Ulla Potash., MD Primary Cardiologist: Terrence Dupont, MD Primary EP: Lovena Le, MD Reason for Visit: EP/device follow-up  History of Present Illness  Timothy Faulkner is a 67 y.o. male with NICM s/p ICD implant, chronic systolic HF, HTN, DM and ESRD on HD who presents today for routine electrophysiology followup. Since last being seen in our clinic, he reports he is doing well and has no complaints. He denies chest pain or shortness of breath. He denies palpitations, dizziness, near syncope or syncope. He denies LE swelling, orthopnea, PND or recent weight gain. He is compliant and tolerating medications without difficulty. Of note, he is currently undergoing work-up at [facility] for renal transplant.  Past Medical History Past Medical History  Diagnosis Date  . HYPERTENSION   . HYPERCHOLESTEROLEMIA, MIXED   . CHF   . DIAB W/O COMP TYPE II/UNS NOT STATED UNCNTRL   . 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 Anemia   . Acute on chronic renal failure     /notes 03/11/2013 (03/11/2013)  . Heart murmur   . Automatic implantable cardioverter-defibrillator in situ     [employer]    Past Surgical History Past Surgical History  Procedure Laterality Date  . Incision and drainage abscess / hematoma of bursa / knee / thigh Left 2012    "knee" (03/11/2013)  . Cardiac defibrillator placement      [employer]  . Av fistula placement Right 12/13/2012    Procedure: ARTERIOVENOUS (AV) FISTULA CREATION;  Surgeon: [name], MD;  Location: [facility] OR;  Service: Vascular;  Laterality: Right;  Ultrasound guided    Allergies/Intolerances No Known Allergies  Current Home Medications Current Outpatient Prescriptions  Medication Sig Dispense Refill  . aspirin EC 81 MG EC tablet Take 1 tablet (81 mg total) by mouth daily.  30 tablet  3  . carvedilol (COREG) 25 MG  tablet Take 25 mg by mouth 2 (two) times daily with a meal.        . colchicine 0.6 MG tablet Take 0.6 mg by mouth daily as needed (Gout).       Marland Kitchen digoxin (LANOXIN) 0.125 MG tablet Take 1 tablet (0.125 mg total) by mouth daily.  30 tablet  3  . ezetimibe-simvastatin (VYTORIN) 10-40 MG per tablet Take 1 tablet by mouth at bedtime.        . isosorbide-hydrALAZINE (BIDIL) 20-37.5 MG per tablet Take 1 tablet by mouth 2 (two) times daily.        . Vitamin D, Ergocalciferol, (DRISDOL) 50000 UNITS CAPS Take 50,000 Units by mouth every 7 (seven) days. mondays       No current facility-administered medications for this visit.    Social History History   Social History  . Marital Status: Married    Spouse Name: N/A    Number of Children: N/A  . Years of Education: N/A   Occupational History  . Not on file.   Social History Main Topics  . Smoking status: Former Smoker -- 0.75 packs/day for 30 years    Types: Cigarettes    Quit date: 11/25/1992  . Smokeless tobacco: Never Used  . Alcohol Use: Yes  . Drug Use: No  . Sexual Activity: Yes   Other Topics Concern  . Not on file   Social History Narrative  . No narrative on file     Review of Systems General: No  chills, fever, night sweats or weight changes Cardiovascular: No chest pain, dyspnea on exertion, edema, orthopnea, palpitations, paroxysmal nocturnal dyspnea Dermatological: No rash, lesions or masses Respiratory: No cough, dyspnea Urologic: No hematuria, dysuria Abdominal: No nausea, vomiting, diarrhea, bright red blood per rectum, melena, or hematemesis Neurologic: No visual changes, weakness, changes in mental status All other systems reviewed and are otherwise negative except as noted above.  Physical Exam Vitals: Blood pressure 118/70, pulse 67, weight 189 lb (85.73 kg).  General: Well developed, well appearing 67 y.o. male in no acute distress. HEENT: Normocephalic, atraumatic. EOMs intact. Sclera nonicteric.  Oropharynx clear.  Neck: Supple without bruits. No JVD. Lungs: Respirations regular and unlabored, CTA bilaterally. No wheezes, rales or rhonchi. Heart: RRR. S1, S2 present. No murmurs, rub, S3 or S4. Abdomen: Soft, non-tender, non-distended. BS present x 4 quadrants. No hepatosplenomegaly.  Extremities: No clubbing, cyanosis or edema. DP/PT/Radials 2+ and equal bilaterally. Psych: Normal affect. Neuro: Alert and oriented X 3. Moves all extremities spontaneously. Skin: ICD implant site intact and well healed.   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 NICM s/p ICD implant - normal device function - no programming changes made - see Pace[employer] report for full details - continue routine remote ICD follow-up every 3 months - return for follow-up with Dr. Lovena Le in one year 2. Chronic systolic HF - stable without HF symptoms - euvolemic by exam - continue medical therapy and keep scheduled follow-up with [name]  Signed, Leith Szafranski, PA-C 07/19/2013, 2:53 PM

## 2013-07-19 NOTE — Patient Instructions (Addendum)
Remote monitoring is used to monitor your Pacemaker of ICD from home. This monitoring reduces the number of office visits required to check your device to one time per year. It allows [place] to keep an eye on the functioning of your device to ensure it is working properly. You are scheduled for a device check from home on 10/19/13  You may send your transmission at any time that day. If you have a wireless device, the transmission will be sent automatically. After your physician reviews your transmission, you will receive a postcard with your next transmission date.  Your physician wants you to follow-up in: 1 year with Dr. Knox Saliva will receive a reminder letter in the mail two months in advance. If you don't receive a letter, please call our office to schedule the follow-up appointment.

## 2013-07-21 LAB — MDC_IDC_ENUM_SESS_TYPE_INCLINIC
Implantable Pulse Generator Serial Number: 268731
Lead Channel Pacing Threshold Amplitude: 0.9 V
Lead Channel Setting Pacing Pulse Width: 0.5 ms
Zone Setting Detection Interval: 315.8 ms

## 2013-09-19 ENCOUNTER — Inpatient Hospital Stay (HOSPITAL_COMMUNITY)
Admission: EM | Admit: 2013-09-19 | Discharge: 2013-09-22 | DRG: 193 | Disposition: A | Discharge status: Home / Self Care | Payer: Medicare Other | Attending: Internal Medicine | Admitting: Internal Medicine

## 2013-09-19 ENCOUNTER — Emergency Department (HOSPITAL_COMMUNITY): Payer: Medicare Other

## 2013-09-19 ENCOUNTER — Encounter (HOSPITAL_COMMUNITY): Payer: Self-pay | Admitting: Emergency Medicine

## 2013-09-19 DIAGNOSIS — I428 Other cardiomyopathies: Secondary | ICD-10-CM | POA: Diagnosis present

## 2013-09-19 DIAGNOSIS — D61818 Other pancytopenia: Secondary | ICD-10-CM | POA: Diagnosis present

## 2013-09-19 DIAGNOSIS — I10 Essential (primary) hypertension: Secondary | ICD-10-CM | POA: Diagnosis present

## 2013-09-19 DIAGNOSIS — N186 End stage renal disease: Secondary | ICD-10-CM | POA: Diagnosis present

## 2013-09-19 DIAGNOSIS — I509 Heart failure, unspecified: Secondary | ICD-10-CM | POA: Diagnosis present

## 2013-09-19 DIAGNOSIS — Z79899 Other long term (current) drug therapy: Secondary | ICD-10-CM

## 2013-09-19 DIAGNOSIS — Z992 Dependence on renal dialysis: Secondary | ICD-10-CM

## 2013-09-19 DIAGNOSIS — I12 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 chronic kidney disease or end stage renal disease: Secondary | ICD-10-CM | POA: Diagnosis present

## 2013-09-19 DIAGNOSIS — E119 Type 2 diabetes mellitus without complications: Secondary | ICD-10-CM | POA: Diagnosis present

## 2013-09-19 DIAGNOSIS — Z87891 Personal history of nicotine dependence: Secondary | ICD-10-CM

## 2013-09-19 DIAGNOSIS — J189 Pneumonia, unspecified organism: Principal | ICD-10-CM | POA: Diagnosis present

## 2013-09-19 DIAGNOSIS — R042 Hemoptysis: Secondary | ICD-10-CM | POA: Diagnosis present

## 2013-09-19 DIAGNOSIS — Z7982 Long term (current) use of aspirin: Secondary | ICD-10-CM

## 2013-09-19 DIAGNOSIS — Z95810 Presence of automatic (implantable) cardiac defibrillator: Secondary | ICD-10-CM | POA: Diagnosis present

## 2013-09-19 DIAGNOSIS — E7800 Pure hypercholesterolemia, unspecified: Secondary | ICD-10-CM | POA: Diagnosis present

## 2013-09-19 DIAGNOSIS — I5022 Chronic systolic (congestive) heart failure: Secondary | ICD-10-CM | POA: Diagnosis present

## 2013-09-19 DIAGNOSIS — N2581 Secondary hyperparathyroidism of renal origin: Secondary | ICD-10-CM | POA: Diagnosis present

## 2013-09-19 HISTORY — DX: Shortness of breath: R06.02

## 2013-09-19 HISTORY — DX: Gastro-esophageal reflux disease without esophagitis: K21.9

## 2013-09-19 HISTORY — DX: Presence of cardiac pacemaker: Z95.0

## 2013-09-19 LAB — POCT I-STAT, CHEM 8
BUN: 33 mg/dL — ABNORMAL HIGH (ref 6–23)
CALCIUM ION: 0.96 mmol/L — AB (ref 1.13–1.30)
Chloride: 95 mEq/L — ABNORMAL LOW (ref 96–112)
Creatinine, Ser: 4.5 mg/dL — ABNORMAL HIGH (ref 0.50–1.35)
GLUCOSE: 75 mg/dL (ref 70–99)
HEMATOCRIT: 49 % (ref 39.0–52.0)
HEMOGLOBIN: 16.7 g/dL (ref 13.0–17.0)
Potassium: 3.5 mEq/L — ABNORMAL LOW (ref 3.7–5.3)
Sodium: 137 mEq/L (ref 137–147)
TCO2: 26 mmol/L (ref 0–100)

## 2013-09-19 LAB — CBC
HEMATOCRIT: 41.2 % (ref 39.0–52.0)
Hemoglobin: 14.4 g/dL (ref 13.0–17.0)
MCH: 35.1 pg — AB (ref 26.0–34.0)
MCHC: 35 g/dL (ref 30.0–36.0)
MCV: 100.5 fL — AB (ref 78.0–100.0)
PLATELETS: 113 10*3/uL — AB (ref 150–400)
RBC: 4.1 MIL/uL — ABNORMAL LOW (ref 4.22–5.81)
RDW: 16.3 % — AB (ref 11.5–15.5)
WBC: 5.1 10*3/uL (ref 4.0–10.5)

## 2013-09-19 NOTE — ED Notes (Signed)
Pt states dry cough x 2 months.  MD gave protonix Wed and pt states he has had some relief with the medication.  However, since Friday pt has had a decreased appetite and has started coughing of redish-black sputum.  Denies any pain or nausea.  Dialysis M-W-F.

## 2013-09-19 NOTE — ED Provider Notes (Signed)
CSN: [id number]     Arrival date & time 09/19/13  1810 History   First MD Initiated Contact with Patient 09/19/13 2304     Chief Complaint  Patient presents with  . Hemoptysis     (Consider location/radiation/quality/duration/timing/severity/associated sxs/prior Treatment) Patient is a 68 y.o. male presenting with cough. The history is provided by the patient.  Cough Cough characteristics:  Non-productive Severity:  Moderate Onset quality:  Gradual Duration:  16 weeks Timing:  Intermittent Progression:  Unchanged Chronicity:  New Smoker: no   Context: not animal exposure   Relieved by:  Nothing Worsened by:  Nothing tried Ineffective treatments:  None tried Associated symptoms: no chest pain, no chills, no diaphoresis, no fever, no weight loss and no wheezing   Risk factors: no recent travel   Now with black or maroon sputum  Past Medical History  Diagnosis Date  . HYPERTENSION   . HYPERCHOLESTEROLEMIA, MIXED   . CHF   . DIAB W/O COMP TYPE II/UNS NOT STATED UNCNTRL   . 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 Anemia   . Acute on chronic renal failure     /notes 03/11/2013 (03/11/2013)  . Heart murmur   . Automatic implantable cardioverter-defibrillator in situ     [employer]   Past Surgical History  Procedure Laterality Date  . Incision and drainage abscess / hematoma of bursa / knee / thigh Left 2012    "knee" (03/11/2013)  . Av fistula placement Right 12/13/2012    Procedure: ARTERIOVENOUS (AV) FISTULA CREATION;  Surgeon: [name], MD;  Location: [place];  Service: Vascular;  Laterality: Right;  Ultrasound guided  . Cardiac defibrillator placement      [employer]   Family History  Problem Relation Age of Onset  . Heart disease Father    History  Substance Use Topics  . Smoking status: Former Smoker -- 0.75 packs/day for 30 years    Types: Cigarettes    Quit date: 11/25/1992  . Smokeless tobacco: Never Used  . Alcohol Use: Yes    Review of  Systems  Constitutional: Negative for fever, chills, weight loss and diaphoresis.  Respiratory: Positive for cough. Negative for choking, chest tightness and wheezing.   Cardiovascular: Negative for chest pain.  All other systems reviewed and are negative.      Allergies  Review of patient's allergies indicates no known allergies.  Home Medications   Current Outpatient Rx  Name  Route  Sig  Dispense  Refill  . aspirin EC 81 MG EC tablet   Oral   Take 1 tablet (81 mg total) by mouth daily.   30 tablet   3   . carvedilol (COREG) 25 MG tablet   Oral   Take 25 mg by mouth 2 (two) times daily with a meal.           . digoxin (LANOXIN) 0.125 MG tablet   Oral   Take 1 tablet (0.125 mg total) by mouth daily.   30 tablet   3   . ezetimibe-simvastatin (VYTORIN) 10-40 MG per tablet   Oral   Take 1 tablet by mouth at bedtime.           . isosorbide-hydrALAZINE (BIDIL) 20-37.5 MG per tablet   Oral   Take 1 tablet by mouth 2 (two) times daily.           . pantoprazole (PROTONIX) 40 MG tablet   Oral   Take 40 mg by mouth at bedtime.         Marland Kitchen  Vitamin D, Ergocalciferol, (DRISDOL) 50000 UNITS CAPS   Oral   Take 50,000 Units by mouth every 7 (seven) days. mondays          BP 141/82  Pulse 81  Temp(Src) 98.4 F (36.9 C) (Oral)  Resp 80  Ht 5\' 8"  (1.727 m)  Wt 184 lb 4.8 oz (83.598 kg)  BMI 28.03 kg/m2  SpO2 99% Physical Exam  Constitutional: He is oriented to person, place, and time. He appears well-developed and well-nourished. No distress.  HENT:  Head: Normocephalic and atraumatic.  Mouth/Throat: Oropharynx is clear and moist.  Eyes: Conjunctivae are normal. Pupils are equal, round, and reactive to light.  Neck: Normal range of motion. Neck supple.  Cardiovascular: Normal rate, regular rhythm and intact distal pulses.   Pulmonary/Chest: Effort normal and breath sounds normal. He has no wheezes. He has no rales.  Abdominal: Soft. Bowel sounds are normal.  There is no tenderness. There is no rebound and no guarding.  Musculoskeletal: Normal range of motion.  Neurological: He is alert and oriented to person, place, and time.  Skin: Skin is warm and dry.  Psychiatric: He has a normal mood and affect.    ED Course  Procedures (including critical care time) Labs Review Labs Reviewed  CBC - Abnormal; Notable for the following:    RBC 4.10 (*)    MCV 100.5 (*)    MCH 35.1 (*)    RDW 16.3 (*)    Platelets 113 (*)    All other components within normal limits  POCT I-STAT, CHEM 8 - Abnormal; Notable for the following:    Potassium 3.5 (*)    Chloride 95 (*)    BUN 33 (*)    Creatinine, Ser 4.50 (*)    Calcium, Ion 0.96 (*)    All other components within normal limits   Imaging Review Dg Chest 2 View  09/19/2013   CLINICAL DATA:  Cough, hemoptysis  EXAM: CHEST  2 VIEW  COMPARISON:  March 13, 2013  FINDINGS: The heart size and mediastinal contours are stable. The heart size is enlarged. Single lead cardiac pacemaker is unchanged. There is no focal infiltrate, pulmonary edema, or pleural effusion. There is probable mild atelectasis of both lung bases. The visualized skeletal structures are stable.  IMPRESSION: No active cardiopulmonary disease.   Electronically Signed   By: Abelardo Diesel M.D.   On: 09/19/2013 20:46    EKG Interpretation   None       MDM   Final diagnoses:  None     Date: 09/20/2013  Rate: 86  Rhythm: normal sinus rhythm  QRS Axis: left  Intervals: QT prolonged  ST/T Wave abnormalities: normal  Conduction Disutrbances:none  Narrative Interpretation:   Old EKG Reviewed: none available      Devaney Segers K Sargon Scouten-Rasch, MD 09/20/13 [phone]

## 2013-09-20 ENCOUNTER — Encounter (HOSPITAL_COMMUNITY): Payer: Self-pay | Admitting: Internal Medicine

## 2013-09-20 ENCOUNTER — Emergency Department (HOSPITAL_COMMUNITY): Payer: Medicare Other

## 2013-09-20 DIAGNOSIS — R042 Hemoptysis: Secondary | ICD-10-CM | POA: Diagnosis present

## 2013-09-20 DIAGNOSIS — N186 End stage renal disease: Secondary | ICD-10-CM

## 2013-09-20 DIAGNOSIS — I10 Essential (primary) hypertension: Secondary | ICD-10-CM

## 2013-09-20 DIAGNOSIS — E119 Type 2 diabetes mellitus without complications: Secondary | ICD-10-CM

## 2013-09-20 LAB — HEMOGLOBIN A1C
HEMOGLOBIN A1C: 5.2 % (ref <5.7)
MEAN PLASMA GLUCOSE: 103 mg/dL (ref <117)

## 2013-09-20 LAB — COMPREHENSIVE METABOLIC PANEL
ALBUMIN: 2.9 g/dL — AB (ref 3.5–5.2)
ALK PHOS: 161 U/L — AB (ref 39–117)
ALT: 69 U/L — AB (ref 0–53)
AST: 77 U/L — ABNORMAL HIGH (ref 0–37)
BUN: 41 mg/dL — ABNORMAL HIGH (ref 6–23)
CO2: 23 mEq/L (ref 19–32)
Calcium: 8.7 mg/dL (ref 8.4–10.5)
Chloride: 96 mEq/L (ref 96–112)
Creatinine, Ser: 4.78 mg/dL — ABNORMAL HIGH (ref 0.50–1.35)
GFR calc Af Amer: 13 mL/min — ABNORMAL LOW (ref >90)
GFR calc non Af Amer: 11 mL/min — ABNORMAL LOW (ref >90)
Glucose, Bld: 77 mg/dL (ref 70–99)
POTASSIUM: 3.8 meq/L (ref 3.7–5.3)
SODIUM: 139 meq/L (ref 137–147)
TOTAL PROTEIN: 6.6 g/dL (ref 6.0–8.3)
Total Bilirubin: 3 mg/dL — ABNORMAL HIGH (ref 0.3–1.2)

## 2013-09-20 LAB — TSH: TSH: 2.245 u[IU]/mL (ref 0.350–4.500)

## 2013-09-20 LAB — CBC
HCT: 37.8 % — ABNORMAL LOW (ref 39.0–52.0)
Hemoglobin: 13.2 g/dL (ref 13.0–17.0)
MCH: 34.8 pg — AB (ref 26.0–34.0)
MCHC: 34.9 g/dL (ref 30.0–36.0)
MCV: 99.7 fL (ref 78.0–100.0)
PLATELETS: 101 10*3/uL — AB (ref 150–400)
RBC: 3.79 MIL/uL — ABNORMAL LOW (ref 4.22–5.81)
RDW: 16.2 % — ABNORMAL HIGH (ref 11.5–15.5)
WBC: 4.5 10*3/uL (ref 4.0–10.5)

## 2013-09-20 LAB — MAGNESIUM: MAGNESIUM: 2.3 mg/dL (ref 1.5–2.5)

## 2013-09-20 LAB — GLUCOSE, CAPILLARY
GLUCOSE-CAPILLARY: 122 mg/dL — AB (ref 70–99)
Glucose-Capillary: 88 mg/dL (ref 70–99)
Glucose-Capillary: 89 mg/dL (ref 70–99)
Glucose-Capillary: 108 mg/dL — ABNORMAL HIGH (ref 70–99)
Glucose-Capillary: 122 mg/dL — ABNORMAL HIGH (ref 70–99)

## 2013-09-20 LAB — PHOSPHORUS: Phosphorus: 4.7 mg/dL — ABNORMAL HIGH (ref 2.3–4.6)

## 2013-09-20 LAB — EXPECTORATED SPUTUM ASSESSMENT W GRAM STAIN, RFLX TO RESP C: Special Requests: NORMAL

## 2013-09-20 LAB — DIGOXIN LEVEL

## 2013-09-20 LAB — PROCALCITONIN: Procalcitonin: 1.65 ng/mL

## 2013-09-20 LAB — STREP PNEUMONIAE URINARY ANTIGEN: Strep Pneumo Urinary Antigen: NEGATIVE

## 2013-09-20 MED ORDER — CARVEDILOL 25 MG PO TABS
25.0000 mg | ORAL_TABLET | Freq: Two times a day (BID) | ORAL | Status: DC
  Administered 2013-09-20 – 2013-09-22 (×5): 25 mg via ORAL
  Filled 2013-09-20 (×7): qty 1

## 2013-09-20 MED ORDER — ACETAMINOPHEN 650 MG RE SUPP: 650.0000 mg | Freq: Four times a day (QID) | RECTAL | Status: DC | PRN

## 2013-09-20 MED ORDER — DOCUSATE SODIUM 100 MG PO CAPS
100.0000 mg | ORAL_CAPSULE | Freq: Two times a day (BID) | ORAL | Status: DC
  Administered 2013-09-20 – 2013-09-21 (×3): 100 mg via ORAL
  Filled 2013-09-20 (×6): qty 1

## 2013-09-20 MED ORDER — LANTHANUM CARBONATE 500 MG PO CHEW
1000.0000 mg | CHEWABLE_TABLET | Freq: Three times a day (TID) | ORAL | Status: DC
  Filled 2013-09-20 (×2): qty 2

## 2013-09-20 MED ORDER — PIPERACILLIN-TAZOBACTAM 3.375 G IVPB 30 MIN
3.3750 g | Freq: Once | INTRAVENOUS | Status: AC
  Administered 2013-09-20: 3.375 g via INTRAVENOUS
  Filled 2013-09-20: qty 50

## 2013-09-20 MED ORDER — ISOSORB DINITRATE-HYDRALAZINE 20-37.5 MG PO TABS
1.0000 | ORAL_TABLET | Freq: Two times a day (BID) | ORAL | Status: DC
  Administered 2013-09-20 – 2013-09-22 (×5): 1 via ORAL
  Filled 2013-09-20 (×6): qty 1

## 2013-09-20 MED ORDER — LEVOFLOXACIN IN D5W 500 MG/100ML IV SOLN: 500.0000 mg | INTRAVENOUS | Status: DC

## 2013-09-20 MED ORDER — IOHEXOL 300 MG/ML  SOLN
80.0000 mL | Freq: Once | INTRAMUSCULAR | Status: AC | PRN
  Administered 2013-09-20: 80 mL via INTRAVENOUS

## 2013-09-20 MED ORDER — SODIUM CHLORIDE 0.9 % IJ SOLN
3.0000 mL | Freq: Two times a day (BID) | INTRAMUSCULAR | Status: DC
  Administered 2013-09-20 – 2013-09-21 (×3): 3 mL via INTRAVENOUS

## 2013-09-20 MED ORDER — DOXERCALCIFEROL 4 MCG/2ML IV SOLN
12.0000 ug | INTRAVENOUS | Status: DC
  Administered 2013-09-21: 12 ug via INTRAVENOUS
  Filled 2013-09-20: qty 6

## 2013-09-20 MED ORDER — EZETIMIBE-SIMVASTATIN 10-40 MG PO TABS
1.0000 | ORAL_TABLET | Freq: Every day | ORAL | Status: DC
  Administered 2013-09-20 – 2013-09-21 (×2): 1 via ORAL
  Filled 2013-09-20 (×3): qty 1

## 2013-09-20 MED ORDER — VANCOMYCIN HCL IN DEXTROSE 1-5 GM/200ML-% IV SOLN
1000.0000 mg | Freq: Once | INTRAVENOUS | Status: AC
  Administered 2013-09-20: 1000 mg via INTRAVENOUS
  Filled 2013-09-20: qty 200

## 2013-09-20 MED ORDER — CALCIUM ACETATE 667 MG PO CAPS
1334.0000 mg | ORAL_CAPSULE | Freq: Three times a day (TID) | ORAL | Status: DC
  Administered 2013-09-20 – 2013-09-22 (×6): 1334 mg via ORAL
  Filled 2013-09-20 (×8): qty 2

## 2013-09-20 MED ORDER — LEVOFLOXACIN IN D5W 750 MG/150ML IV SOLN
750.0000 mg | Freq: Once | INTRAVENOUS | Status: AC
  Administered 2013-09-20: 750 mg via INTRAVENOUS
  Filled 2013-09-20: qty 150

## 2013-09-20 MED ORDER — SODIUM CHLORIDE 0.9 % IJ SOLN: 3.0000 mL | INTRAMUSCULAR | Status: DC | PRN

## 2013-09-20 MED ORDER — HYDROCODONE-ACETAMINOPHEN 5-325 MG PO TABS: 1.0000 | ORAL_TABLET | ORAL | Status: DC | PRN

## 2013-09-20 MED ORDER — ONDANSETRON HCL 4 MG/2ML IJ SOLN: 4.0000 mg | Freq: Four times a day (QID) | INTRAMUSCULAR | Status: DC | PRN

## 2013-09-20 MED ORDER — ACETAMINOPHEN 325 MG PO TABS
650.0000 mg | ORAL_TABLET | Freq: Four times a day (QID) | ORAL | Status: DC | PRN
Start: 2013-09-20 — End: 2013-09-22

## 2013-09-20 MED ORDER — ONDANSETRON HCL 4 MG PO TABS: 4.0000 mg | ORAL_TABLET | Freq: Four times a day (QID) | ORAL | Status: DC | PRN

## 2013-09-20 MED ORDER — INSULIN ASPART 100 UNIT/ML ~~LOC~~ SOLN
0.0000 [IU] | SUBCUTANEOUS | Status: DC
  Administered 2013-09-20 – 2013-09-22 (×4): 1 [IU] via SUBCUTANEOUS

## 2013-09-20 MED ORDER — SODIUM CHLORIDE 0.9 % IV SOLN: 250.0000 mL | INTRAVENOUS | Status: DC | PRN

## 2013-09-20 MED ORDER — DIGOXIN 125 MCG PO TABS
0.1250 mg | ORAL_TABLET | Freq: Every day | ORAL | Status: DC
  Administered 2013-09-20 – 2013-09-22 (×3): 0.125 mg via ORAL
  Filled 2013-09-20 (×3): qty 1

## 2013-09-20 MED ORDER — SODIUM CHLORIDE 0.9 % IJ SOLN
3.0000 mL | Freq: Two times a day (BID) | INTRAMUSCULAR | Status: DC
  Administered 2013-09-20 – 2013-09-21 (×2): 3 mL via INTRAVENOUS

## 2013-09-20 MED ORDER — PANTOPRAZOLE SODIUM 40 MG PO TBEC
40.0000 mg | DELAYED_RELEASE_TABLET | Freq: Every day | ORAL | Status: DC
  Administered 2013-09-20 – 2013-09-21 (×2): 40 mg via ORAL
  Filled 2013-09-20 (×2): qty 1

## 2013-09-20 NOTE — Progress Notes (Signed)
BP 105/54  Pulse 57  Temp(Src) 97.8 F (36.6 C) (Oral)  Resp 18  Ht 5\' 8"  (1.727 m)  Wt 81.8 kg (180 lb 5.4 oz)  BMI 27.43 kg/m2  SpO2 100% - agree with plan hold ASA. - cont antibiotic regimen.

## 2013-09-20 NOTE — Consult Note (Signed)
[facility] Renal Consultation Note    Indication for Consultation:  Management of ESRD/hemodialysis; anemia, hypertension/volume and secondary hyperparathyroidism  HPI: Timothy Faulkner is a 68 y.o. male ESRD patient (MWF HD) who presented to the ED after completion of his dialysis session yesterday after nursing staff at the center found his cough to productive of dark burgundy/black sputum. He endorsed a dry cough and hiccups for about 2 months unrelieved by Protonix.  Sputum production worsened over the past week but has stared to return to a normal color.  Family is present assisting with history and discloses that he has also been experiencing orthopnea and vivid dreams that have been interfering with his sleep to the extent that he may get only an hour of rest. CT chest is remarkable for ascites, trace left pleural effusion, cardiomyopathy and asymmetric pulmonary infiltrates. Pulmonology has been consulted. At the time of this encounter, he is resting in bed and without complaints.  He states that his appetite has been poor of late but that he did eat a good breakfast today. He is due for dialysis tomorrow.  Past Medical History  Diagnosis Date  . HYPERTENSION   . HYPERCHOLESTEROLEMIA, MIXED   . CHF   . DIAB W/O COMP TYPE II/UNS NOT STATED UNCNTRL   . 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 Anemia   . Acute on chronic renal failure     /notes 03/11/2013 (03/11/2013)  . Heart murmur   . Automatic implantable cardioverter-defibrillator in situ     [employer]  . Pacemaker   . Shortness of breath   . GERD (gastroesophageal reflux disease)    Past Surgical History  Procedure Laterality Date  . Incision and drainage abscess / hematoma of bursa / knee / thigh Left 2012    "knee" (03/11/2013)  . Av fistula placement Right 12/13/2012    Procedure: ARTERIOVENOUS (AV) FISTULA CREATION;  Surgeon: [name], MD;  Location: [place];  Service: Vascular;  Laterality: Right;   Ultrasound guided  . Cardiac defibrillator placement      [employer]  . Cardiac catheterization    . Insert / replace / remove pacemaker     Family History  Problem Relation Age of Onset  . Heart disease Father   . CAD Father    Social History:  reports that he quit smoking about 20 years ago. His smoking use included Cigarettes. He has a 22.5 pack-year smoking history. He has never used smokeless tobacco. He reports that he drinks alcohol. He reports that he does not use illicit drugs. No Known Allergies Prior to Admission medications   Medication Sig Start Date End Date Taking? Authorizing Provider  aspirin EC 81 MG EC tablet Take 1 tablet (81 mg total) by mouth daily. 08/26/12  Yes Clent Demark, MD  carvedilol (COREG) 25 MG tablet Take 25 mg by mouth 2 (two) times daily with a meal.     Yes Historical Provider, MD  digoxin (LANOXIN) 0.125 MG tablet Take 1 tablet (0.125 mg total) by mouth daily. 08/26/12  Yes Clent Demark, MD  ezetimibe-simvastatin (VYTORIN) 10-40 MG per tablet Take 1 tablet by mouth at bedtime.     Yes Historical Provider, MD  isosorbide-hydrALAZINE (BIDIL) 20-37.5 MG per tablet Take 1 tablet by mouth 2 (two) times daily.     Yes Historical Provider, MD  pantoprazole (PROTONIX) 40 MG tablet Take 40 mg by mouth at bedtime.   Yes Historical Provider, MD  Vitamin D, Ergocalciferol, (DRISDOL)  50000 UNITS CAPS Take 50,000 Units by mouth every 7 (seven) days. mondays   Yes Historical Provider, MD   Current Facility-Administered Medications  Medication Dose Route Frequency Provider Last Rate Last Dose  . 0.9 %  sodium chloride infusion  250 mL Intravenous PRN Toy Baker, MD      . acetaminophen (TYLENOL) tablet 650 mg  650 mg Oral Q6H PRN Toy Baker, MD       Or  . acetaminophen (TYLENOL) suppository 650 mg  650 mg Rectal Q6H PRN Toy Baker, MD      . carvedilol (COREG) tablet 25 mg  25 mg Oral BID WC Toy Baker, MD   25 mg at  09/20/13 [id number]  . digoxin (LANOXIN) tablet 0.125 mg  0.125 mg Oral Daily Toy Baker, MD   0.125 mg at 09/20/13 1017  . docusate sodium (COLACE) capsule 100 mg  100 mg Oral BID Toy Baker, MD   100 mg at 09/20/13 0943  . ezetimibe-simvastatin (VYTORIN) 10-40 MG per tablet 1 tablet  1 tablet Oral QHS Toy Baker, MD      . HYDROcodone-acetaminophen (NORCO/VICODIN) 5-325 MG per tablet 1-2 tablet  1-2 tablet Oral Q4H PRN Toy Baker, MD      . insulin aspart (novoLOG) injection 0-9 Units  0-9 Units Subcutaneous Q4H Toy Baker, MD   1 Units at 09/20/13 1213  . isosorbide-hydrALAZINE (BIDIL) 20-37.5 MG per tablet 1 tablet  1 tablet Oral BID Toy Baker, MD   1 tablet at 09/20/13 1017  . lanthanum (FOSRENOL) chewable tablet 1,000 mg  1,000 mg Oral TID WC Alvia Grove, PA-C      . [START ON 09/22/2013] levofloxacin (LEVAQUIN) IVPB 500 mg  500 mg Intravenous Q48H Narda Bonds, RPH      . ondansetron (ZOFRAN) tablet 4 mg  4 mg Oral Q6H PRN Toy Baker, MD       Or  . ondansetron (ZOFRAN) injection 4 mg  4 mg Intravenous Q6H PRN Toy Baker, MD      . pantoprazole (PROTONIX) EC tablet 40 mg  40 mg Oral QHS Anastassia Doutova, MD      . sodium chloride 0.9 % injection 3 mL  3 mL Intravenous Q12H Anastassia Doutova, MD      . sodium chloride 0.9 % injection 3 mL  3 mL Intravenous Q12H Toy Baker, MD   3 mL at 09/20/13 0943  . sodium chloride 0.9 % injection 3 mL  3 mL Intravenous PRN Toy Baker, MD       Labs: Basic Metabolic Panel:  Recent Labs Lab 09/19/13 2027 09/20/13 0700  NA 137 139  K 3.5* 3.8  CL 95* 96  CO2  --  23  GLUCOSE 75 77  BUN 33* 41*  CREATININE 4.50* 4.78*  CALCIUM  --  8.7  PHOS  --  4.7*   Liver Function Tests:  Recent Labs Lab 09/20/13 0700  AST 77*  ALT 69*  ALKPHOS 161*  BILITOT 3.0*  PROT 6.6  ALBUMIN 2.9*   CBC:  Recent Labs Lab 09/19/13 1905 09/19/13 2027 09/20/13 0700  WBC  5.1  --  4.5  HGB 14.4 16.7 13.2  HCT 41.2 49.0 37.8*  MCV 100.5*  --  99.7  PLT 113*  --  101*   CBG:  Recent Labs Lab 09/20/13 0550 09/20/13 0744 09/20/13 1056  GLUCAP 88 89 122*   Studies/Results: Dg Chest 2 View  09/19/2013   CLINICAL DATA:  Cough, hemoptysis  EXAM: CHEST  2 VIEW  COMPARISON:  March 13, 2013  FINDINGS: The heart size and mediastinal contours are stable. The heart size is enlarged. Single lead cardiac pacemaker is unchanged. There is no focal infiltrate, pulmonary edema, or pleural effusion. There is probable mild atelectasis of both lung bases. The visualized skeletal structures are stable.  IMPRESSION: No active cardiopulmonary disease.   Electronically Signed   By: Abelardo Diesel M.D.   On: 09/19/2013 20:46   Ct Chest W Contrast  09/20/2013   CLINICAL DATA:  Dry cough for 2 months. Cough productive of sputum. Hemoptysis.  EXAM: CT CHEST WITH CONTRAST  TECHNIQUE: Multidetector CT imaging of the chest was performed during intravenous contrast administration.  CONTRAST:  74mL OMNIPAQUE IOHEXOL 300 MG/ML  SOLN  COMPARISON:  DG CHEST 2 VIEW dated 09/19/2013  FINDINGS: Mediastinal lipomatosis is present. Three vessel aortic arch with atherosclerosis. No aortic aneurysm. Enlargement of the main pulmonary artery, left and right pulmonary arteries compatible with pulmonary arterial hypertension. Cardiomegaly is present. Left chest pacemaker power pack is present with left subclavian pacemaker leads. Coronary artery atherosclerosis is present. If office based assessment of coronary risk factors has not been performed, it is now recommended.  Lung windows demonstrate airspace disease in the posterior lingula, left lower lobe and right lower lobe. The distribution is most compatible with pneumonia, multifocal. Tiny left pleural effusion is present. No pericardial effusion. There is no mediastinal or axillary adenopathy. No hilar adenopathy.  Incidental imaging the upper abdomen  demonstrates ascites. There are no aggressive osseous lesions. Thoracic spine DISH. Bilateral gynecomastia.  IMPRESSION: 1. Multifocal airspace disease involving both lower lobes in the lingula, most compatible with multifocal pneumonia. Asymmetric/atypical pulmonary edema is considered less likely. A combination of pneumonia and CHF may be present. In a patient with hemoptysis, pulmonary hemorrhage merits consideration. 2. Cardiomegaly with enlargement of the pulmonary arteries suggesting pulmonary arterial hypertension. 3. Ascites. 4. Trace left pleural effusion which may be due to volume overload or parapneumonic.   Electronically Signed   By: Dereck Ligas M.D.   On: 09/20/2013 01:21    ROS: Productive cough. 10 pt ROS asked and answered. All systems negative except as in the HPI above   Physical Exam: Filed Vitals:   09/19/13 2315 09/20/13 0218 09/20/13 0432 09/20/13 0935  BP: 127/80 144/86 138/88 105/54  Pulse: 78 77 81 57  Temp:  98 F (36.7 C) 97.8 F (36.6 C)   TempSrc:  Oral Oral   Resp: 24 25 18    Height:      Weight:   81.8 kg (180 lb 5.4 oz)   SpO2: 96% 96% 100% 100%     General: Well developed, well nourished, slow mentation, in no acute distress. Head: Normocephalic, atraumatic, sclera non-icteric, mucus membranes are moist Neck: Supple. Trace JVD  Lungs: Diminished at bases. Diffuse wheezes. No rales, or rhonchi. Breathing is unlabored. Heart: RRR with S1 S2. No murmurs, rubs, or gallops appreciated. Abdomen: Distended, non-tender,  normoactive bowel sounds. No rebound/guarding. No obvious abdominal masses. M-S:  Strength and tone appear normal for age. Lower extremities:without edema or ischemic changes, no open wounds  Neuro: Alert and oriented X 3. Moves all extremities spontaneously. Psych:  Responds to questions appropriately with a normal affect. Dialysis Access: R AVF +bruit  Dialysis Orders: Center: [place]  on MWF . EDW 85kg HD Bath 3K/2.25 (K+ low only  recently) Time 4:00 Heparin 3000. Access R AVF BFR 450 DFR 800    Hectorol 12 mcg IV/HD Epogen  0 (d/c'd on 2/6)   Units IV/HD  Venofer  0 Recent labs: Hgb 12.8, Tsat 27%, Ferr 840, P 5.2, PTH 481, Alb 3.8  Assessment/Plan: 1. Hemoptysis/ Pulmonary infiltrates - Mgmt per primary. On levaquin. Pulm consulted, recommends continuing abx and f/u CXR. Bronch deferred given risk of conscious sedation with CM. Holding ASA. No heparin HD for now. 2. ESRD -  MWF, K+3.8. HD tomorrow with added K+ bath 3. Hypertension/volume  - SBPs 130s on Corg and Bidil. Under EDW here with ascites and trace left pleural effusion on CT and orthopnea not previously disclosed outpatient. Try for UF 3L and titrate EDW down as tolerated. 4. Anemia  - Hgb at goal. Op ESA's d/c'd on 2/6. No Fe for now. Follow CBC 5. Metabolic bone disease -  Ca 8.7 (9.6 corrected) P controlled on velphoro (samples)  which is not on formulary here. Labs support use of phoslo while admitted. Will follow labs and resume velphoro at d/c. Last PTH 481. Continue Vit D and Sensipar 6. Nutrition - Alb 2.9. Poor appetite recently. Renal diet, multivitamin, ONS 7. Systolic CHF - EF 0000000 per echo 03/2013. 8. Sleep disturbance - ? Underlying OSA . Might benefit from a sleep eval  Sonnie Alamo, PA-C [place] Pager [phone] 09/20/2013, 1:42 PM   I have seen and examined patient, discussed with PA and agree with assessment and plan as outlined above. [name] MD pager [phone]    cell [phone] 09/20/2013, 4:35 PM

## 2013-09-20 NOTE — Progress Notes (Signed)
ANTIBIOTIC CONSULT NOTE - INITIAL  Pharmacy Consult for Levaquin  Indication: rule out pneumonia  No Known Allergies  Patient Measurements: Height: 5\' 8"  (172.7 cm) Weight: 180 lb 5.4 oz (81.8 kg) IBW/kg (Calculated) : 68.4  Vital Signs: Temp: 97.8 F (36.6 C) (02/10 0432) Temp src: Oral (02/10 0432) BP: 138/88 mmHg (02/10 0432) Pulse Rate: 81 (02/10 0432)  Labs:  Recent Labs  09/19/13 1905 09/19/13 2027  WBC 5.1  --   HGB 14.4 16.7  PLT 113*  --   CREATININE  --  4.50*   Estimated Creatinine Clearance: 15.4 ml/min (by C-G formula based on Cr of 4.5). No results found for this basename: VANCOTROUGH, VANCOPEAK, VANCORANDOM, GENTTROUGH, GENTPEAK, GENTRANDOM, TOBRATROUGH, TOBRAPEAK, TOBRARND, AMIKACINPEAK, AMIKACINTROU, AMIKACIN,  in the last 72 hours   Microbiology: No results found for this or any previous visit (from the past 720 hour(s)).  Medical History: Past Medical History  Diagnosis Date  . HYPERTENSION   . HYPERCHOLESTEROLEMIA, MIXED   . CHF   . DIAB W/O COMP TYPE II/UNS NOT STATED UNCNTRL   . 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 Anemia   . Acute on chronic renal failure     /notes 03/11/2013 (03/11/2013)  . Heart murmur   . Automatic implantable cardioverter-defibrillator in situ     [employer]  . Pacemaker    Assessment: 68 y/o M with hemoptysis/pulmonary infiltrates to start Levaquin per pharmacy. Noted ESRD on HD, discussed with MD about HCAP coverage (MD had spoken with CCM and decided to cover with Levaquin monotherapy). WBC wnl, other labs as above.   Goal of Therapy:  Clinical resolution   Plan:  -Levaquin 750 mg IV x 1 now, then 500 mg IV q48h -Trend WBC, temp -F/U additional plans  Narda Bonds 09/20/2013,5:36 AM

## 2013-09-20 NOTE — Progress Notes (Signed)
Patient alert and oriented x4 throughout shift.  Patient denies any pain or shortness of breath at this time.  Phone and call bell within reach.  Family at bedside this afternoon.  Patient states he has no questions or concerns at this time.  Will continue to monitor.

## 2013-09-20 NOTE — H&P (Signed)
PCP:  Ulla Potash., MD Harw[place]   Chief Complaint:  Coughing up  HPI: Timothy Faulkner is a 68 y.o. male   has a past medical history of HYPERTENSION; HYPERCHOLESTEROLEMIA, MIXED; CHF; DIAB W/O COMP TYPE II/UNS NOT STATED UNCNTRL; Other primary cardiomyopathies (07/16/2011); Anemia; Acute on chronic renal failure; Heart murmur; Automatic implantable cardioverter-defibrillator in situ; and Pacemaker.   Presented with  2 month hx of dry cough. 5 days ago it became productive of black sputum followed by burgundy colored sputum for the past 2 days. He has had poor appetite. He has drank some water but have not had any food for the past 2 days.  Denies any fever, chills or chest pain. Amount of sputum about a teaspoon up to 10 times a day. The last time he was coughing it looked more like regular mucus. Patient was given prescription for protonix recently in hopes to treat it as a potential acid reflux.  Patient has hx of ESRD on HD on Mondays, Wednesdays and Fridays [facility] [address]) last HD was 09/19/2013 Hospitalist called for admission   Review of Systems:     Pertinent positives include:  productive cough, coughing up of blood, change in color of mucus. Mild weight loss   Constitutional:  No weight loss, night sweats, Fevers, chills, fatigue,  HEENT:  No headaches, Difficulty swallowing,Tooth/dental problems,Sore throat,  No sneezing, itching, ear ache, nasal congestion, post nasal drip,  Cardio-vascular:  No chest pain, Orthopnea, PND, anasarca, dizziness, palpitations.no Bilateral lower extremity swelling  GI:  No heartburn, indigestion, abdominal pain, nausea, vomiting, diarrhea, change in bowel habits, loss of appetite, melena, blood in stool, hematemesis Resp:  no shortness of breath at rest. No dyspnea on exertion, No excess mucus, no, No non-productive cough, No No wheezing. Skin:  no rash or lesions. No jaundice GU:  no dysuria, change in color of urine, no urgency or  frequency. No straining to urinate.  No flank pain.  Musculoskeletal:  No joint pain or no joint swelling. No decreased range of motion. No back pain.  Psych:  No change in mood or affect. No depression or anxiety. No memory loss.  Neuro: no localizing neurological complaints, no tingling, no weakness, no double vision, no gait abnormality, no slurred speech, no confusion  Otherwise ROS are negative except for above, 10 systems were reviewed  Past Medical History: Past Medical History  Diagnosis Date  . HYPERTENSION   . HYPERCHOLESTEROLEMIA, MIXED   . CHF   . DIAB W/O COMP TYPE II/UNS NOT STATED UNCNTRL   . 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 Anemia   . Acute on chronic renal failure     /notes 03/11/2013 (03/11/2013)  . Heart murmur   . Automatic implantable cardioverter-defibrillator in situ     [employer]  . Pacemaker    Past Surgical History  Procedure Laterality Date  . Incision and drainage abscess / hematoma of bursa / knee / thigh Left 2012    "knee" (03/11/2013)  . Av fistula placement Right 12/13/2012    Procedure: ARTERIOVENOUS (AV) FISTULA CREATION;  Surgeon: [name], MD;  Location: [place];  Service: Vascular;  Laterality: Right;  Ultrasound guided  . Cardiac defibrillator placement      [employer]  . Cardiac catheterization    . Insert / replace / remove pacemaker       Medications: Prior to Admission medications   Medication Sig Start Date End Date Taking? Authorizing Provider  aspirin EC 81 MG EC  tablet Take 1 tablet (81 mg total) by mouth daily. 08/26/12  Yes Clent Demark, MD  carvedilol (COREG) 25 MG tablet Take 25 mg by mouth 2 (two) times daily with a meal.     Yes Historical Provider, MD  digoxin (LANOXIN) 0.125 MG tablet Take 1 tablet (0.125 mg total) by mouth daily. 08/26/12  Yes Clent Demark, MD  ezetimibe-simvastatin (VYTORIN) 10-40 MG per tablet Take 1 tablet by mouth at bedtime.     Yes Historical Provider, MD   isosorbide-hydrALAZINE (BIDIL) 20-37.5 MG per tablet Take 1 tablet by mouth 2 (two) times daily.     Yes Historical Provider, MD  pantoprazole (PROTONIX) 40 MG tablet Take 40 mg by mouth at bedtime.   Yes Historical Provider, MD  Vitamin D, Ergocalciferol, (DRISDOL) 50000 UNITS CAPS Take 50,000 Units by mouth every 7 (seven) days. mondays   Yes Historical Provider, MD    Allergies:  No Known Allergies  Social History:  Ambulatory  independently   Lives at  Home with family   reports that he quit smoking about 20 years ago. His smoking use included Cigarettes. He has a 22.5 pack-year smoking history. He has never used smokeless tobacco. He reports that he drinks alcohol. He reports that he does not use illicit drugs.   Family History: family history includes CAD in his father; Heart disease in his father.    Physical Exam: Patient Vitals for the past 24 hrs:  BP Temp Temp src Pulse Resp SpO2 Height Weight  09/20/13 0218 144/86 mmHg 98 F (36.7 C) Oral 77 25 96 % - -  09/19/13 2315 127/80 mmHg - - 78 24 96 % - -  09/19/13 2247 141/82 mmHg - - 81 80 99 % - -  09/19/13 1902 134/84 mmHg 98.4 F (36.9 C) Oral 89 16 95 % 5\' 8"  (1.727 m) 83.598 kg (184 lb 4.8 oz)    1. General:  in No Acute distress 2. Psychological: Alert and  Oriented 3. Head/ENT:   Moist   Mucous Membranes                          Head Non traumatic, neck supple                          Normal  Dentition 4. SKIN: normal   Skin turgor,  Skin clean Dry and intact no rash 5. Heart: Regular rate and rhythm no Murmur, Rub or gallop 6. Lungs: no wheezes but occasional crackles   7. Abdomen: Soft, non-tender, Non distended 8. Lower extremities: no clubbing, cyanosis, or edema 9. Neurologically Grossly intact, moving all 4 extremities equally 10. MSK: Normal range of motion  body mass index is 28.03 kg/(m^2).   Labs on Admission:   Recent Labs  09/19/13 2027  NA 137  K 3.5*  CL 95*  GLUCOSE 75  BUN 33*   CREATININE 4.50*   No results found for this basename: AST, ALT, ALKPHOS, BILITOT, PROT, ALBUMIN,  in the last 72 hours No results found for this basename: LIPASE, AMYLASE,  in the last 72 hours  Recent Labs  09/19/13 1905 09/19/13 2027  WBC 5.1  --   HGB 14.4 16.7  HCT 41.2 49.0  MCV 100.5*  --   PLT 113*  --    No results found for this basename: CKTOTAL, CKMB, CKMBINDEX, TROPONINI,  in the last 72 hours No results found  for this basename: TSH, T4TOTAL, FREET3, T3FREE, THYROIDAB,  in the last 72 hours No results found for this basename: VITAMINB12, FOLATE, FERRITIN, TIBC, IRON, RETICCTPCT,  in the last 72 hours Lab Results  Component Value Date   HGBA1C 4.9 05/14/2013    Estimated Creatinine Clearance: 16.8 ml/min (by C-G formula based on Cr of 4.5). ABG    Component Value Date/Time   TCO2 26 09/19/2013 2027     No results found for this basename: DDIMER     Other results:  I have pearsonaly reviewed this: ECG REPORT  Rate: 86  Rhythm: SR with PAC ST&T Change: no ischemic   Cultures: No results found for this basename: sdes, specrequest, cult, reptstatus       Radiological Exams on Admission: Dg Chest 2 View  09/19/2013   CLINICAL DATA:  Cough, hemoptysis  EXAM: CHEST  2 VIEW  COMPARISON:  March 13, 2013  FINDINGS: The heart size and mediastinal contours are stable. The heart size is enlarged. Single lead cardiac pacemaker is unchanged. There is no focal infiltrate, pulmonary edema, or pleural effusion. There is probable mild atelectasis of both lung bases. The visualized skeletal structures are stable.  IMPRESSION: No active cardiopulmonary disease.   Electronically Signed   By: Abelardo Diesel M.D.   On: 09/19/2013 20:46   Ct Chest W Contrast  09/20/2013   CLINICAL DATA:  Dry cough for 2 months. Cough productive of sputum. Hemoptysis.  EXAM: CT CHEST WITH CONTRAST  TECHNIQUE: Multidetector CT imaging of the chest was performed during intravenous contrast  administration.  CONTRAST:  70mL OMNIPAQUE IOHEXOL 300 MG/ML  SOLN  COMPARISON:  DG CHEST 2 VIEW dated 09/19/2013  FINDINGS: Mediastinal lipomatosis is present. Three vessel aortic arch with atherosclerosis. No aortic aneurysm. Enlargement of the main pulmonary artery, left and right pulmonary arteries compatible with pulmonary arterial hypertension. Cardiomegaly is present. Left chest pacemaker power pack is present with left subclavian pacemaker leads. Coronary artery atherosclerosis is present. If office based assessment of coronary risk factors has not been performed, it is now recommended.  Lung windows demonstrate airspace disease in the posterior lingula, left lower lobe and right lower lobe. The distribution is most compatible with pneumonia, multifocal. Tiny left pleural effusion is present. No pericardial effusion. There is no mediastinal or axillary adenopathy. No hilar adenopathy.  Incidental imaging the upper abdomen demonstrates ascites. There are no aggressive osseous lesions. Thoracic spine DISH. Bilateral gynecomastia.  IMPRESSION: 1. Multifocal airspace disease involving both lower lobes in the lingula, most compatible with multifocal pneumonia. Asymmetric/atypical pulmonary edema is considered less likely. A combination of pneumonia and CHF may be present. In a patient with hemoptysis, pulmonary hemorrhage merits consideration. 2. Cardiomegaly with enlargement of the pulmonary arteries suggesting pulmonary arterial hypertension. 3. Ascites. 4. Trace left pleural effusion which may be due to volume overload or parapneumonic.   Electronically Signed   By: Dereck Ligas M.D.   On: 09/20/2013 01:21    Chart has been reviewed  Assessment/Plan 68 yo M with hx of CAD,  ESRD and DM here with hemoptysis.   Present on Admission:  . Hemoptysis - spoke to pulmonology who will see in Am and will likely do a bronchoscopy, NPO after 5 AM.                 Cover with levaquin, already got vancomycin .  HYPERTENSION - continue home meds . End stage renal disease - If still here on Wednesday will need HD, left a  msg for Renal . DIAB W/O COMP TYPE II/UNS NOT STATED UNCNTRL - SSI sensative while NPO . CHF - currently euvolemic    Prophylaxis: SCD, Protonix  CODE STATUS: FULL CODE  Other plan as per orders.  I have spent a total of 65 min on this admission, PCCM has been consulted  [place] 09/20/2013, 2:23 AM

## 2013-09-20 NOTE — ED Notes (Signed)
Pt taken to CT.

## 2013-09-20 NOTE — Consult Note (Signed)
Name: Timothy Faulkner MRN: [id number] DOB: 1945/08/22    ADMISSION DATE:  09/19/2013 CONSULTATION DATE:  2/10  REFERRING MD :  Roel Cluck  PRIMARY SERVICE:  [facility]   CHIEF COMPLAINT: hemoptysis and pulmonary infiltrate   BRIEF PATIENT DESCRIPTION:  68 year old male who was admitted on 2/10 w/ hemoptysis  and asymmetric pulmonary infiltrates. PCCM asked to assess re: hemoptysis.   SIGNIFICANT EVENTS / STUDIES:  8/14 ECHO: EF 20-25% CT chest 2/10: bilateral asymmetric airspace disease Left lingula/ RLL. CM, enlarged PAs  CULTURES: Sputum 2/10 >>   ANTIBIOTICS: vanc 2/9>>> Zosyn 2/9>>  SUBJECTIVE:  Reports that his sputum is now clear to yellow with only blood streaks.  He denies chest pain.  Breathing is better.  VITAL SIGNS: Temp:  [97.8 F (36.6 C)-98.4 F (36.9 C)] 97.8 F (36.6 C) (02/10 0432) Pulse Rate:  [57-89] 57 (02/10 0935) Resp:  [16-80] 18 (02/10 0432) BP: (105-144)/(54-88) 105/54 mmHg (02/10 0935) SpO2:  [95 %-100 %] 100 % (02/10 0935) Weight:  [180 lb 5.4 oz (81.8 kg)-184 lb 4.8 oz (83.598 kg)] 180 lb 5.4 oz (81.8 kg) (02/10 0432) Room air  PHYSICAL EXAMINATION: General: no distress Neuro: alert, follows commands  HEENT: no sinus tenderness Cardiovascular: regular Lungs: basilar rales Lt > Rt, no wheeze Abdomen: soft, non tender Musculoskeletal: no edema Skin: no rashes  CBC Recent Labs     09/19/13  1905  09/19/13  2027  09/20/13  0700  WBC  5.1   --   4.5  HGB  14.4  16.7  13.2  HCT  41.2  49.0  37.8*  PLT  113*   --   101*   BMET Recent Labs     09/19/13  2027  09/20/13  0700  NA  137  139  K  3.5*  3.8  CL  95*  96  CO2   --   23  BUN  33*  41*  CREATININE  4.50*  4.78*  GLUCOSE  75  77    Electrolytes Recent Labs     09/20/13  0700  CALCIUM  8.7  MG  2.3  PHOS  4.7*    Sepsis Markers Recent Labs     09/20/13  0700  PROCALCITON  1.65   Liver Enzymes Recent Labs     09/20/13  0700  AST  77*  ALT  69*  ALKPHOS   161*  BILITOT  3.0*  ALBUMIN  2.9*   Glucose Recent Labs     09/20/13  0550  09/20/13  0744  GLUCAP  88  89    Imaging Dg Chest 2 View  09/19/2013   CLINICAL DATA:  Cough, hemoptysis  EXAM: CHEST  2 VIEW  COMPARISON:  March 13, 2013  FINDINGS: The heart size and mediastinal contours are stable. The heart size is enlarged. Single lead cardiac pacemaker is unchanged. There is no focal infiltrate, pulmonary edema, or pleural effusion. There is probable mild atelectasis of both lung bases. The visualized skeletal structures are stable.  IMPRESSION: No active cardiopulmonary disease.   Electronically Signed   By: Abelardo Diesel M.D.   On: 09/19/2013 20:46   Ct Chest W Contrast  09/20/2013   CLINICAL DATA:  Dry cough for 2 months. Cough productive of sputum. Hemoptysis.  EXAM: CT CHEST WITH CONTRAST  TECHNIQUE: Multidetector CT imaging of the chest was performed during intravenous contrast administration.  CONTRAST:  58mL OMNIPAQUE IOHEXOL 300 MG/ML  SOLN  COMPARISON:  DG  CHEST 2 VIEW dated 09/19/2013  FINDINGS: Mediastinal lipomatosis is present. Three vessel aortic arch with atherosclerosis. No aortic aneurysm. Enlargement of the main pulmonary artery, left and right pulmonary arteries compatible with pulmonary arterial hypertension. Cardiomegaly is present. Left chest pacemaker power pack is present with left subclavian pacemaker leads. Coronary artery atherosclerosis is present. If office based assessment of coronary risk factors has not been performed, it is now recommended.  Lung windows demonstrate airspace disease in the posterior lingula, left lower lobe and right lower lobe. The distribution is most compatible with pneumonia, multifocal. Tiny left pleural effusion is present. No pericardial effusion. There is no mediastinal or axillary adenopathy. No hilar adenopathy.  Incidental imaging the upper abdomen demonstrates ascites. There are no aggressive osseous lesions. Thoracic spine DISH. Bilateral  gynecomastia.  IMPRESSION: 1. Multifocal airspace disease involving both lower lobes in the lingula, most compatible with multifocal pneumonia. Asymmetric/atypical pulmonary edema is considered less likely. A combination of pneumonia and CHF may be present. In a patient with hemoptysis, pulmonary hemorrhage merits consideration. 2. Cardiomegaly with enlargement of the pulmonary arteries suggesting pulmonary arterial hypertension. 3. Ascites. 4. Trace left pleural effusion which may be due to volume overload or parapneumonic.   Electronically Signed   By: Dereck Ligas M.D.   On: 09/20/2013 01:21    ASSESSMENT / PLAN:  A: Hemoptysis likely from PNA >> improving. P: -continue Abx per primary team -f/u CXR intermittently to document clearing of infiltrate -defer bronchoscopy for now given clinical improvement >> he would be increased risk for procedure with systolic CHF and ESRD -continue to hold aspirin until ensured that hemoptysis fully resolved  PCCM will sign off.  If his hemoptysis progress, then please call for PCCM to re-evaluate.  Chesley Mires, MD [facility] Pulmonary/Critical Care 09/20/2013, 10:10 AM Pager:  [phone] After 3pm call: [phone]

## 2013-09-20 NOTE — Consult Note (Signed)
Name: Timothy Faulkner MRN: [id number] DOB: [birth year]    ADMISSION DATE:  09/19/2013 CONSULTATION DATE:  2/10  REFERRING MD :  Roel Cluck  PRIMARY SERVICE:  [facility]   CHIEF COMPLAINT/REASON FOR CONSULT: hemoptysis and pulmonary infiltrate   BRIEF PATIENT DESCRIPTION:  68 year old male who was admitted on 2/10 w/ hemoptysis  and asymmetric pulmonary infiltrates. PCCM asked to assess re: hemoptysis.   SIGNIFICANT EVENTS / STUDIES:  8/14: ECHO: EF 20-25% CT chest 2/10: bilateral asymmetric airspace disease Left lingula/ RLL. CM, enlarged PAs  LINES / TUBES:   CULTURES:   ANTIBIOTICS: vanc 2/9>>> Zosyn 2/9>>  HISTORY OF PRESENT ILLNESS:   This is a 68 year old male w/ multiple medical co-morbids: ESRD, HTN, systolic/diastolic HF, and anemia. Presented to the ER on 2/9 w/ cc: coughing up blood. PT reports approx 2 mo h/o dry cough. Became productive about 5d prior to admit with black colored sputum, this became more burgundy over the last 2 days. He States usually a teaspoon volume at a time up to 10 times a day. He denied fever, chills, chest pain, dyspnea or sick exposure.  Decided to come in after HD on 2/9 for further evaluation. Was in CT of chest was obtained to further eval. PCCM asked to see in the setting of hemoptysis and pulmonary infiltrates.   PAST MEDICAL HISTORY :  Past Medical History  Diagnosis Date  . HYPERTENSION   . HYPERCHOLESTEROLEMIA, MIXED   . CHF   . DIAB W/O COMP TYPE II/UNS NOT STATED UNCNTRL   . 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 Anemia   . Acute on chronic renal failure     /notes 03/11/2013 (03/11/2013)  . Heart murmur   . Automatic implantable cardioverter-defibrillator in situ     [employer]  . Pacemaker    Past Surgical History  Procedure Laterality Date  . Incision and drainage abscess / hematoma of bursa / knee / thigh Left 2012    "knee" (03/11/2013)  . Av fistula placement Right 12/13/2012    Procedure: ARTERIOVENOUS (AV)  FISTULA CREATION;  Surgeon: [name], MD;  Location: [facility];  Service: Vascular;  Laterality: Right;  Ultrasound guided  . Cardiac defibrillator placement      [employer]  . Cardiac catheterization    . Insert / replace / remove pacemaker     Prior to Admission medications   Medication Sig Start Date End Date Taking? Authorizing Provider  aspirin EC 81 MG EC tablet Take 1 tablet (81 mg total) by mouth daily. 08/26/12  Yes Clent Demark, MD  carvedilol (COREG) 25 MG tablet Take 25 mg by mouth 2 (two) times daily with a meal.     Yes Historical Provider, MD  digoxin (LANOXIN) 0.125 MG tablet Take 1 tablet (0.125 mg total) by mouth daily. 08/26/12  Yes Clent Demark, MD  ezetimibe-simvastatin (VYTORIN) 10-40 MG per tablet Take 1 tablet by mouth at bedtime.     Yes Historical Provider, MD  isosorbide-hydrALAZINE (BIDIL) 20-37.5 MG per tablet Take 1 tablet by mouth 2 (two) times daily.     Yes Historical Provider, MD  pantoprazole (PROTONIX) 40 MG tablet Take 40 mg by mouth at bedtime.   Yes Historical Provider, MD  Vitamin D, Ergocalciferol, (DRISDOL) 50000 UNITS CAPS Take 50,000 Units by mouth every 7 (seven) days. mondays   Yes Historical Provider, MD   No Known Allergies  FAMILY HISTORY:  Family History  Problem Relation Age of Onset  .  Heart disease Father   . CAD Father    SOCIAL HISTORY:  reports that he quit smoking about 20 years ago. His smoking use included Cigarettes. He has a 22.5 pack-year smoking history. He has never used smokeless tobacco. He reports that he drinks alcohol. He reports that he does not use illicit drugs.  Review of Systems:   Bolds are positive  Constitutional: weight loss, gain, night sweats, Fevers, chills, fatigue .  HEENT: headaches, Sore throat, sneezing, nasal congestion, post nasal drip, Difficulty swallowing, Tooth/dental problems, visual complaints visual changes, ear ache CV:  chest pain, radiates: ,Orthopnea, PND, swelling in lower  extremities, dizziness, palpitations, syncope.  GI  heartburn, indigestion, abdominal pain, nausea, vomiting, diarrhea, change in bowel habits, loss of appetite, bloody stools.  Resp: cough, productive: , hemoptysis, dyspnea, chest pain, pleuritic.  Skin: rash or itching or icterus GU: dysuria, change in color of urine, urgency or frequency. flank pain, hematuria  MS: joint pain or swelling. decreased range of motion  Psych: change in mood or affect. depression or anxiety.  Neuro: difficulty with speech, weakness, numbness, ataxia    SUBJECTIVE:  No acute distress VITAL SIGNS: Temp:  [98 F (36.7 C)-98.4 F (36.9 C)] 98 F (36.7 C) (02/10 0218) Pulse Rate:  [77-89] 77 (02/10 0218) Resp:  [16-80] 25 (02/10 0218) BP: (127-144)/(80-86) 144/86 mmHg (02/10 0218) SpO2:  [95 %-99 %] 96 % (02/10 0218) Weight:  [83.598 kg (184 lb 4.8 oz)] 83.598 kg (184 lb 4.8 oz) (02/09 1902)  PHYSICAL EXAMINATION: General:  Awake, alert, no focal def.  Neuro:  No focal def  HEENT:  [place]/ no JVD  Cardiovascular:  rrr Lungs:posterior rales, no accessory muscle use  Abdomen:  NT + bowel sounds Musculoskeletal:  Intact  Skin:  Dry intact    Recent Labs Lab 09/19/13 2027  NA 137  K 3.5*  CL 95*  BUN 33*  CREATININE 4.50*  GLUCOSE 75    Recent Labs Lab 09/19/13 1905 09/19/13 2027  HGB 14.4 16.7  HCT 41.2 49.0  WBC 5.1  --   PLT 113*  --    Dg Chest 2 View  09/19/2013   CLINICAL DATA:  Cough, hemoptysis  EXAM: CHEST  2 VIEW  COMPARISON:  March 13, 2013  FINDINGS: The heart size and mediastinal contours are stable. The heart size is enlarged. Single lead cardiac pacemaker is unchanged. There is no focal infiltrate, pulmonary edema, or pleural effusion. There is probable mild atelectasis of both lung bases. The visualized skeletal structures are stable.  IMPRESSION: No active cardiopulmonary disease.   Electronically Signed   By: Abelardo Diesel M.D.   On: 09/19/2013 20:46   Ct Chest W  Contrast  09/20/2013   CLINICAL DATA:  Dry cough for 2 months. Cough productive of sputum. Hemoptysis.  EXAM: CT CHEST WITH CONTRAST  TECHNIQUE: Multidetector CT imaging of the chest was performed during intravenous contrast administration.  CONTRAST:  41mL OMNIPAQUE IOHEXOL 300 MG/ML  SOLN  COMPARISON:  DG CHEST 2 VIEW dated 09/19/2013  FINDINGS: Mediastinal lipomatosis is present. Three vessel aortic arch with atherosclerosis. No aortic aneurysm. Enlargement of the main pulmonary artery, left and right pulmonary arteries compatible with pulmonary arterial hypertension. Cardiomegaly is present. Left chest pacemaker power pack is present with left subclavian pacemaker leads. Coronary artery atherosclerosis is present. If office based assessment of coronary risk factors has not been performed, it is now recommended.  Lung windows demonstrate airspace disease in the posterior lingula, left lower lobe and  right lower lobe. The distribution is most compatible with pneumonia, multifocal. Tiny left pleural effusion is present. No pericardial effusion. There is no mediastinal or axillary adenopathy. No hilar adenopathy.  Incidental imaging the upper abdomen demonstrates ascites. There are no aggressive osseous lesions. Thoracic spine DISH. Bilateral gynecomastia.  IMPRESSION: 1. Multifocal airspace disease involving both lower lobes in the lingula, most compatible with multifocal pneumonia. Asymmetric/atypical pulmonary edema is considered less likely. A combination of pneumonia and CHF may be present. In a patient with hemoptysis, pulmonary hemorrhage merits consideration. 2. Cardiomegaly with enlargement of the pulmonary arteries suggesting pulmonary arterial hypertension. 3. Ascites. 4. Trace left pleural effusion which may be due to volume overload or parapneumonic.   Electronically Signed   By: Dereck Ligas M.D.   On: 09/20/2013 01:21    ASSESSMENT / PLAN:  Hemoptysis w/ asymmetric pulmonary infiltrates.  Diff  dx include PNA (although prodrome not consistent with a pneumonia), asymmetric edema (EF 20-25%), also consider airway lesion.  Plan Get drug abuse screen. Cocaine also on consideration for cause of hemoptysis  Send PCT Cont empiric abx for now Keep NPO Defer FOB for now since hemoptysis seems to be resolving, his underlying CM and ESRD present potential concern for conscious sedation , If persists or recurs, would certianly inspect Will resume diet  ESRD/CM w/ EF 20-25%, DM Plan Per primary service.    Rigoberto Noel  Pulmonary and [place] Pager: [phone]  09/20/2013, 3:04 AM

## 2013-09-21 LAB — LEGIONELLA ANTIGEN, URINE: Legionella Antigen, Urine: NEGATIVE

## 2013-09-21 LAB — GLUCOSE, CAPILLARY
GLUCOSE-CAPILLARY: 102 mg/dL — AB (ref 70–99)
GLUCOSE-CAPILLARY: 139 mg/dL — AB (ref 70–99)
GLUCOSE-CAPILLARY: 90 mg/dL (ref 70–99)
Glucose-Capillary: 88 mg/dL (ref 70–99)
Glucose-Capillary: 119 mg/dL — ABNORMAL HIGH (ref 70–99)

## 2013-09-21 LAB — RENAL FUNCTION PANEL
Albumin: 2.5 g/dL — ABNORMAL LOW (ref 3.5–5.2)
BUN: 54 mg/dL — ABNORMAL HIGH (ref 6–23)
CALCIUM: 8.4 mg/dL (ref 8.4–10.5)
CO2: 23 meq/L (ref 19–32)
CREATININE: 6.04 mg/dL — AB (ref 0.50–1.35)
Chloride: 96 mEq/L (ref 96–112)
GFR calc non Af Amer: 9 mL/min — ABNORMAL LOW (ref >90)
GFR, EST AFRICAN AMERICAN: 10 mL/min — AB (ref >90)
Glucose, Bld: 91 mg/dL (ref 70–99)
Phosphorus: 4.6 mg/dL (ref 2.3–4.6)
Potassium: 3.6 mEq/L — ABNORMAL LOW (ref 3.7–5.3)
SODIUM: 136 meq/L — AB (ref 137–147)

## 2013-09-21 LAB — CBC
HEMATOCRIT: 33 % — AB (ref 39.0–52.0)
HEMOGLOBIN: 11.5 g/dL — AB (ref 13.0–17.0)
MCH: 34.8 pg — AB (ref 26.0–34.0)
MCHC: 34.8 g/dL (ref 30.0–36.0)
MCV: 100 fL (ref 78.0–100.0)
Platelets: 83 10*3/uL — ABNORMAL LOW (ref 150–400)
RBC: 3.3 MIL/uL — AB (ref 4.22–5.81)
RDW: 15.9 % — ABNORMAL HIGH (ref 11.5–15.5)
WBC: 3.6 10*3/uL — ABNORMAL LOW (ref 4.0–10.5)

## 2013-09-21 LAB — HEPATITIS B SURFACE ANTIGEN: HEP B S AG: NEGATIVE

## 2013-09-21 MED ORDER — DOXERCALCIFEROL 4 MCG/2ML IV SOLN
INTRAVENOUS | Status: AC
  Filled 2013-09-21: qty 6

## 2013-09-21 MED ORDER — LEVOFLOXACIN 500 MG PO TABS
500.0000 mg | ORAL_TABLET | ORAL | Status: DC
  Administered 2013-09-22: 500 mg via ORAL
  Filled 2013-09-21: qty 1

## 2013-09-21 NOTE — Progress Notes (Signed)
[facility] PROGRESS NOTE  Timothy Faulkner [id number] DOB: 1946/07/31 DOA: 09/19/2013 PCP: Timothy Faulkner., MD  Assessment/Plan: 1-hemoptysis: appears to be secondary to PNa. -no bronchoscopy needed at this moment -continue holding heparin and ASA -continue ABX's -no further hemoptysic episodes since admission  2-ESRD: continue HD per renal service  3-HTN: stable. Continue current medication regimen  4-DM type : continue SSI and low carb diet  5-hx of CHF (systolic in nature): last EF 20-25% on 03/2013 -continue volume control with HD -strict I's and O's -status post ICD  DVT: SCD's   Code Status: Full Family Communication: no family at bedside (seen during HD) Disposition Plan: most likely home tomorrow 2/12   Consultants:  Renal service  PCCM  Procedures:  See below for x-ray reports   Antibiotics: levaquin  HPI/Subjective: Feeling better; no further episodes of hemoptysis, afebrile.  Objective: Filed Vitals:   09/21/13 1430  BP: 112/69  Pulse: 59  Temp:   Resp:     Intake/Output Summary (Last 24 hours) at 09/21/13 1501 Last data filed at 09/21/13 0900  Gross per 24 hour  Intake   1186 ml  Output     50 ml  Net   1136 ml   Filed Weights   09/20/13 0432 09/21/13 0414 09/21/13 1118  Weight: 81.8 kg (180 lb 5.4 oz) 83.553 kg (184 lb 3.2 oz) 83.8 kg (184 lb 11.9 oz)    Exam:   General:  Feeling better, no further episodes of hemoptysis reported; afebrile   Cardiovascular: sinus bradycardia, no rubs or gallops  Respiratory: decrease BS at bases, scattered exp wheezing  Abdomen: soft, NT, ND, positive BS  Musculoskeletal: no edema  Data Reviewed: Basic Metabolic Panel:  Recent Labs Lab 09/19/13 2027 09/20/13 0700 09/21/13 0344  NA 137 139 136*  K 3.5* 3.8 3.6*  CL 95* 96 96  CO2  --  23 23  GLUCOSE 75 77 91  BUN 33* 41* 54*  CREATININE 4.50* 4.78* 6.04*  CALCIUM  --  8.7 8.4  MG  --  2.3  --   PHOS  --  4.7* 4.6    Liver Function Tests:  Recent Labs Lab 09/20/13 0700 09/21/13 0344  AST 77*  --   ALT 69*  --   ALKPHOS 161*  --   BILITOT 3.0*  --   PROT 6.6  --   ALBUMIN 2.9* 2.5*   CBC:  Recent Labs Lab 09/19/13 1905 09/19/13 2027 09/20/13 0700 09/21/13 0344  WBC 5.1  --  4.5 3.6*  HGB 14.4 16.7 13.2 11.5*  HCT 41.2 49.0 37.8* 33.0*  MCV 100.5*  --  99.7 100.0  PLT 113*  --  101* 83*   BNP (last 3 results)  Recent Labs  03/15/13 0414  PROBNP 14693.0*   CBG:  Recent Labs Lab 09/20/13 1617 09/20/13 2103 09/21/13 0004 09/21/13 0409 09/21/13 0840  GLUCAP 108* 122* 102* 90 88    Recent Results (from the past 240 hour(s))  CULTURE, EXPECTORATED SPUTUM-ASSESSMENT     Status: None   Collection Time    09/20/13  9:49 AM      Result Value Ref Range Status   Specimen Description SPUTUM   Final   Special Requests Normal   Final   Sputum evaluation     Final   Value: MICROSCOPIC FINDINGS SUGGEST THAT THIS SPECIMEN IS NOT REPRESENTATIVE OF LOWER RESPIRATORY SECRETIONS. PLEASE RECOLLECT.     CALLED TO E MILIANO,RN 09/20/13 1105 BY K SCHULTZ  Report Status 09/20/2013 FINAL   Final     Studies: Dg Chest 2 View  09/19/2013   CLINICAL DATA:  Cough, hemoptysis  EXAM: CHEST  2 VIEW  COMPARISON:  March 13, 2013  FINDINGS: The heart size and mediastinal contours are stable. The heart size is enlarged. Single lead cardiac pacemaker is unchanged. There is no focal infiltrate, pulmonary edema, or pleural effusion. There is probable mild atelectasis of both lung bases. The visualized skeletal structures are stable.  IMPRESSION: No active cardiopulmonary disease.   Electronically Signed   By: Abelardo Diesel M.D.   On: 09/19/2013 20:46   Ct Chest W Contrast  09/20/2013   CLINICAL DATA:  Dry cough for 2 months. Cough productive of sputum. Hemoptysis.  EXAM: CT CHEST WITH CONTRAST  TECHNIQUE: Multidetector CT imaging of the chest was performed during intravenous contrast administration.   CONTRAST:  40mL OMNIPAQUE IOHEXOL 300 MG/ML  SOLN  COMPARISON:  DG CHEST 2 VIEW dated 09/19/2013  FINDINGS: Mediastinal lipomatosis is present. Three vessel aortic arch with atherosclerosis. No aortic aneurysm. Enlargement of the main pulmonary artery, left and right pulmonary arteries compatible with pulmonary arterial hypertension. Cardiomegaly is present. Left chest pacemaker power pack is present with left subclavian pacemaker leads. Coronary artery atherosclerosis is present. If office based assessment of coronary risk factors has not been performed, it is now recommended.  Lung windows demonstrate airspace disease in the posterior lingula, left lower lobe and right lower lobe. The distribution is most compatible with pneumonia, multifocal. Tiny left pleural effusion is present. No pericardial effusion. There is no mediastinal or axillary adenopathy. No hilar adenopathy.  Incidental imaging the upper abdomen demonstrates ascites. There are no aggressive osseous lesions. Thoracic spine DISH. Bilateral gynecomastia.  IMPRESSION: 1. Multifocal airspace disease involving both lower lobes in the lingula, most compatible with multifocal pneumonia. Asymmetric/atypical pulmonary edema is considered less likely. A combination of pneumonia and CHF may be present. In a patient with hemoptysis, pulmonary hemorrhage merits consideration. 2. Cardiomegaly with enlargement of the pulmonary arteries suggesting pulmonary arterial hypertension. 3. Ascites. 4. Trace left pleural effusion which may be due to volume overload or parapneumonic.   Electronically Signed   By: Dereck Ligas M.D.   On: 09/20/2013 01:21    Scheduled Meds: . calcium acetate  1,334 mg Oral TID WC  . carvedilol  25 mg Oral BID WC  . digoxin  0.125 mg Oral Daily  . docusate sodium  100 mg Oral BID  . doxercalciferol      . doxercalciferol  12 mcg Intravenous Q M,W,F-HD  . ezetimibe-simvastatin  1 tablet Oral QHS  . insulin aspart  0-9 Units  Subcutaneous 6 times per day  . isosorbide-hydrALAZINE  1 tablet Oral BID  . [START ON 09/22/2013] levofloxacin  500 mg Oral Q48H  . pantoprazole  40 mg Oral QHS  . sodium chloride  3 mL Intravenous Q12H  . sodium chloride  3 mL Intravenous Q12H    Time spent: >30 minutes    [name]  [facility] Pager [phone]. If 7PM-7AM, please contact night-coverage at [email or web address], password [facility] 09/21/2013, 3:01 PM  LOS: 2 days

## 2013-09-21 NOTE — Clinical Documentation Improvement (Signed)
THIS DOCUMENT IS NOT A PERMANENT PART OF THE MEDICAL RECORD  Please update your documentation within the medical record to reflect your response to this query. If you need help knowing how to do this please call [phone].  09/21/13  Dear Dr. Dyann Kief,   In a better effort to capture your patient's severity of illness, reflect appropriate length of stay and utilization of resources, a review of the medical record has revealed the following indicators.   Based on your clinical judgment, please clarify and document in a progress note and/or discharge summary the clinical condition associated with the following supporting information: In responding to this query please exercise your independent judgment.  The fact that a query is asked, does not imply that any particular answer is desired or expected.  Hello Dr. Dyann Kief!  Abnormal findings (laboratory, x-ray, pathologic, and other diagnostic results) are not coded and reported unless the physician indicates their clinical significance.  The medical record reflects the following clinical findings, please clarify the diagnostic and/or clinical significance:      Component      WBC RBC  Latest Ref Rng      4.0 - 10.5 K/uL 4.22 - 5.81 MIL/uL  09/21/2013     3:44 AM 3.6 (L) 3.30 (L)   Component      Platelets  Latest Ref Rng      150 - 400 K/uL  09/21/2013     3:44 AM 83 (L)    Possible Clinical Conditions?  - Pancytopenia  - Other condition (please document in the progress notes and/or discharge summary)     Reviewed: chart reviewed. 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 After discussing with patient he endorses that PCP has been evaluating him for that. Heparin products and ASA will be avoided meanwhile. This will be placed on patient notes and discharge summary.  Thanks..   Thank You,  Hartley Barefoot  Clinical Documentation  Specialist: [place]

## 2013-09-21 NOTE — Progress Notes (Signed)
  [facility] Progress Note   Subjective: feels a little better, no hemoptysis overnight  Exam  Blood pressure 112/64, pulse 59, temperature 97.5 F (36.4 C), temperature source Oral, resp. rate 20, height 5\' 8"  (1.727 m), weight 83.8 kg (184 lb 11.9 oz), SpO2 98.00%. 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, PTH 481, cont vit D, binders and sensipar 6 Systolic CHF- EF 0000000  Plan- HD today, lower dry wt at tolerated    [name] MD  pager [phone]    cell [phone]  09/21/2013, 1:59 PM     Recent Labs Lab 09/19/13 2027 09/20/13 0700 09/21/13 0344  NA 137 139 136*  K 3.5* 3.8 3.6*  CL 95* 96 96  CO2  --  23 23  GLUCOSE 75 77 91  BUN 33* 41* 54*  CREATININE 4.50* 4.78* 6.04*  CALCIUM  --  8.7 8.4  PHOS  --  4.7* 4.6    Recent Labs Lab 09/20/13 0700 09/21/13 0344  AST 77*  --   ALT 69*  --   ALKPHOS 161*  --   BILITOT 3.0*  --   PROT 6.6  --   ALBUMIN 2.9* 2.5*    Recent Labs Lab 09/19/13 1905 09/19/13 2027 09/20/13 0700 09/21/13 0344  WBC 5.1  --  4.5 3.6*  HGB 14.4 16.7 13.2 11.5*  HCT 41.2 49.0 37.8* 33.0*  MCV 100.5*  --  99.7 100.0  PLT 113*  --  101* 83*   . calcium acetate  1,334 mg Oral TID WC  . carvedilol  25 mg Oral BID WC  . digoxin  0.125 mg Oral Daily  . docusate sodium  100 mg Oral BID  . doxercalciferol      . doxercalciferol  12 mcg Intravenous Q M,W,F-HD  . ezetimibe-simvastatin  1 tablet Oral QHS  . insulin  aspart  0-9 Units Subcutaneous 6 times per day  . isosorbide-hydrALAZINE  1 tablet Oral BID  . [START ON 09/22/2013] levofloxacin  500 mg Oral Q48H  . pantoprazole  40 mg Oral QHS  . sodium chloride  3 mL Intravenous Q12H  . sodium chloride  3 mL Intravenous Q12H     sodium chloride, acetaminophen, acetaminophen, HYDROcodone-acetaminophen, ondansetron (ZOFRAN) IV, ondansetron, sodium chloride

## 2013-09-22 DIAGNOSIS — Z992 Dependence on renal dialysis: Secondary | ICD-10-CM

## 2013-09-22 DIAGNOSIS — D61818 Other pancytopenia: Secondary | ICD-10-CM

## 2013-09-22 DIAGNOSIS — I509 Heart failure, unspecified: Secondary | ICD-10-CM

## 2013-09-22 DIAGNOSIS — E7800 Pure hypercholesterolemia, unspecified: Secondary | ICD-10-CM

## 2013-09-22 DIAGNOSIS — N186 End stage renal disease: Secondary | ICD-10-CM

## 2013-09-22 LAB — GLUCOSE, CAPILLARY
GLUCOSE-CAPILLARY: 112 mg/dL — AB (ref 70–99)
Glucose-Capillary: 118 mg/dL — ABNORMAL HIGH (ref 70–99)
Glucose-Capillary: 124 mg/dL — ABNORMAL HIGH (ref 70–99)
Glucose-Capillary: 97 mg/dL (ref 70–99)

## 2013-09-22 LAB — PROCALCITONIN: Procalcitonin: 1.26 ng/mL

## 2013-09-22 MED ORDER — LEVOFLOXACIN 500 MG PO TABS: 500.0000 mg | ORAL_TABLET | ORAL | Status: DC

## 2013-09-22 MED ORDER — CARVEDILOL 12.5 MG PO TABS
12.5000 mg | ORAL_TABLET | Freq: Two times a day (BID) | ORAL | Status: DC
  Filled 2013-09-22 (×2): qty 1

## 2013-09-22 MED ORDER — ASPIRIN 81 MG PO TBEC: 81.0000 mg | DELAYED_RELEASE_TABLET | Freq: Every day | ORAL | Status: DC

## 2013-09-22 MED ORDER — CARVEDILOL 25 MG PO TABS: 12.5000 mg | ORAL_TABLET | Freq: Two times a day (BID) | ORAL | Status: DC

## 2013-09-22 NOTE — Progress Notes (Signed)
Pt being dc to home, dc instructions given to pt, medications and follow up appointments reviewed, pt stable

## 2013-09-22 NOTE — Discharge Summary (Signed)
Physician Discharge Summary  Timothy Faulkner [id number] DOB: 06/14/1946 DOA: 09/19/2013  PCP: Ulla Potash., MD  Admit date: 09/19/2013 Discharge date: 09/22/2013  Time spent: >30 minutes  Recommendations for Outpatient Follow-up:  1. CBC to follow hemoglobin, WBCs and platelets level. The case patient's numbers remains low he will require to be seen by oncology for further evaluation and treatment of pancytopenia 2. Reassess blood pressure and adjust medications as needed 3. Chest x-ray to assess resolution of infiltrates (plan is to repeat daughter and followup appointment in one week and 4 weeks after)  Discharge Diagnoses:  Active Problems:   DIAB W/O COMP TYPE II/UNS NOT STATED UNCNTRL   HYPERCHOLESTEROLEMIA, MIXED   HYPERTENSION   CHF   ICD-[employer]   End stage renal disease   Hemoptysis   ESRD on hemodialysis   Other pancytopenia   Discharge Condition: A stable and improved. No fever, no further episodes of hemoptysis.  Diet recommendation: Low-sodium diet and low carbohydrates diet  Filed Weights   09/21/13 1118 09/21/13 1448 09/22/13 0230  Weight: 83.8 kg (184 lb 11.9 oz) 78.1 kg (172 lb 2.9 oz) 81.421 kg (179 lb 8 oz)    History of present illness:  68 y.o. 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  Presented with 2 month hx of dry cough. 5 days ago it became productive of black sputum followed by burgundy colored sputum for the past 2 days. He has had poor appetite. He has drank some water but have not had any food for the past 2 days.  Denies any fever, chills or chest pain. Amount of sputum about a teaspoon up to 10 times a day. The last time he was coughing it looked more like regular mucus. Patient was  given prescription for protonix recently in hopes to treat it as a potential acid reflux.  Patient has hx of ESRD on HD on Mondays, Wednesdays and Fridays [facility] [address]) last HD was 09/19/2013  Hospitalist called for admission due to hemoptysis.  Hospital Course:  1-hemoptysis: appears to be secondary to PNA (HCAP).  -no bronchoscopy needed at this moment  -continue holding heparin and ASA for 10 more days -continue ABX's (Levaquin) as instructed to complete antibiotic therapy. -no further hemoptysic episodes since admission. Hemoglobin stable at discharge.  2-ESRD: continue HD per renal service. Hemodialysis treatments on Monday-Wednesday-Friday  3-HTN: stable. Continue current medication regimen -Advised to follow low sodium diet.   4-DM type : continue low carb diet. Close followup with PCP for further decisions regarding hypoglycemic regimen.  5-hx of CHF (systolic in nature): last EF 20-25% on 03/2013  -continue volume control with HD  -low-sodium diet and daily weights -status post ICD -Continue beta blockers.  6-pancytopenia: Appears to be chronic and intermittent in nature. Concerns for heparin exposure and increased i destructions during hemodialysis. After discussing with patient he reports that primary care Dr. is evaluating him for this problem, so no further workup will be initiated as inpatient. The patient's blood cell count remained low, my recommendation is for outpatient oncology referral   7-hyperlipidemia: Continue statins  Procedures: See below for x-ray reports  Consultations: Renal service  Discharge Exam: Filed Vitals:   09/22/13 1355  BP: 102/56  Pulse: 63  Temp: 97.4 F (36.3 C)  Resp: 18   General: Feeling better,  no further episodes of hemoptysis reported; afebrile  Cardiovascular: sinus bradycardia, no rubs or gallops Respiratory: decrease BS at bases, scattered exp wheezing Abdomen: soft, NT, ND, positive BS Musculoskeletal: no  edema   Discharge Instructions  Discharge Orders   Future Appointments Provider Department Dept Phone   10/19/2013 10:00 AM [facility] [phone]   Future Orders Complete By Expires   Diet - low sodium heart healthy  As directed    Discharge instructions  As directed    Comments:     Take medications as prescribed Follow with PCP in 1 week Follow a low sodium diet (less than 2000 mg daily) Be compliant with your HD sessions       Medication List         aspirin 81 MG EC tablet  Take 1 tablet (81 mg total) by mouth daily. On hold for 10 days  Start taking on:  10/02/2013     carvedilol 25 MG tablet  Commonly known as:  COREG  Take 0.5 tablets (12.5 mg total) by mouth 2 (two) times daily with a meal.     digoxin 0.125 MG tablet  Commonly known as:  LANOXIN  Take 1 tablet (0.125 mg total) by mouth daily.     ezetimibe-simvastatin 10-40 MG per tablet  Commonly known as:  VYTORIN  Take 1 tablet by mouth at bedtime.     isosorbide-hydrALAZINE 20-37.5 MG per tablet  Commonly known as:  BIDIL  Take 1 tablet by mouth 2 (two) times daily.     levofloxacin 500 MG tablet  Commonly known as:  LEVAQUIN  Take 1 tablet (500 mg total) by mouth every other day.     pantoprazole 40 MG tablet  Commonly known as:  PROTONIX  Take 40 mg by mouth at bedtime.     Vitamin D (Ergocalciferol) 50000 UNITS Caps capsule  Commonly known as:  DRISDOL  Take 50,000 Units by mouth every 7 (seven) days. mondays       No Known Allergies     Follow-up Information   Follow up with PATEL,JAY K., MD In 1 week.   Specialty:  Nephrology   Contact information:   [address] [place] [address] [phone]        The results of significant diagnostics from this hospitalization (including imaging, microbiology, ancillary and laboratory) are listed below for reference.    Significant Diagnostic Studies: Dg Chest 2  View  09/19/2013   CLINICAL DATA:  Cough, hemoptysis  EXAM: CHEST  2 VIEW  COMPARISON:  March 13, 2013  FINDINGS: The heart size and mediastinal contours are stable. The heart size is enlarged. Single lead cardiac pacemaker is unchanged. There is no focal infiltrate, pulmonary edema, or pleural effusion. There is probable mild atelectasis of both lung bases. The visualized skeletal structures are stable.  IMPRESSION: No active cardiopulmonary disease.   Electronically Signed   By: Abelardo Diesel M.D.   On: 09/19/2013 20:46   Ct Chest W Contrast  09/20/2013   CLINICAL DATA:  Dry cough for 2 months. Cough productive of sputum. Hemoptysis.  EXAM: CT CHEST WITH CONTRAST  TECHNIQUE: Multidetector CT imaging of the chest was performed during intravenous contrast administration.  CONTRAST:  49mL OMNIPAQUE IOHEXOL 300 MG/ML  SOLN  COMPARISON:  DG CHEST 2 VIEW dated 09/19/2013  FINDINGS: Mediastinal lipomatosis is present. Three vessel aortic arch with atherosclerosis. No aortic aneurysm. Enlargement of the main pulmonary artery, left and  right pulmonary arteries compatible with pulmonary arterial hypertension. Cardiomegaly is present. Left chest pacemaker power pack is present with left subclavian pacemaker leads. Coronary artery atherosclerosis is present. If office based assessment of coronary risk factors has not been performed, it is now recommended.  Lung windows demonstrate airspace disease in the posterior lingula, left lower lobe and right lower lobe. The distribution is most compatible with pneumonia, multifocal. Tiny left pleural effusion is present. No pericardial effusion. There is no mediastinal or axillary adenopathy. No hilar adenopathy.  Incidental imaging the upper abdomen demonstrates ascites. There are no aggressive osseous lesions. Thoracic spine DISH. Bilateral gynecomastia.  IMPRESSION: 1. Multifocal airspace disease involving both lower lobes in the lingula, most compatible with multifocal pneumonia.  Asymmetric/atypical pulmonary edema is considered less likely. A combination of pneumonia and CHF may be present. In a patient with hemoptysis, pulmonary hemorrhage merits consideration. 2. Cardiomegaly with enlargement of the pulmonary arteries suggesting pulmonary arterial hypertension. 3. Ascites. 4. Trace left pleural effusion which may be due to volume overload or parapneumonic.   Electronically Signed   By: Dereck Ligas M.D.   On: 09/20/2013 01:21    Microbiology: Recent Results (from the past 240 hour(s))  CULTURE, EXPECTORATED SPUTUM-ASSESSMENT     Status: None   Collection Time    09/20/13  9:49 AM      Result Value Ref Range Status   Specimen Description SPUTUM   Final   Special Requests Normal   Final   Sputum evaluation     Final   Value: MICROSCOPIC FINDINGS SUGGEST THAT THIS SPECIMEN IS NOT REPRESENTATIVE OF LOWER RESPIRATORY SECRETIONS. PLEASE RECOLLECT.     CALLED TO E MILIANO,RN 09/20/13 1105 BY K SCHULTZ   Report Status 09/20/2013 FINAL   Final     Labs: Basic Metabolic Panel:  Recent Labs Lab 09/19/13 2027 09/20/13 0700 09/21/13 0344  NA 137 139 136*  K 3.5* 3.8 3.6*  CL 95* 96 96  CO2  --  23 23  GLUCOSE 75 77 91  BUN 33* 41* 54*  CREATININE 4.50* 4.78* 6.04*  CALCIUM  --  8.7 8.4  MG  --  2.3  --   PHOS  --  4.7* 4.6   Liver Function Tests:  Recent Labs Lab 09/20/13 0700 09/21/13 0344  AST 77*  --   ALT 69*  --   ALKPHOS 161*  --   BILITOT 3.0*  --   PROT 6.6  --   ALBUMIN 2.9* 2.5*   CBC:  Recent Labs Lab 09/19/13 1905 09/19/13 2027 09/20/13 0700 09/21/13 0344  WBC 5.1  --  4.5 3.6*  HGB 14.4 16.7 13.2 11.5*  HCT 41.2 49.0 37.8* 33.0*  MCV 100.5*  --  99.7 100.0  PLT 113*  --  101* 83*   BNP: BNP (last 3 results)  Recent Labs  03/15/13 0414  PROBNP 14693.0*   CBG:  Recent Labs Lab 09/21/13 2002 09/22/13 09/22/13 0410 09/22/13 0801 09/22/13 1052  GLUCAP 139* 124* 97 112* 118*    Signed:  Debbora Ang  [facility] 09/22/2013, 2:07 PM

## 2013-09-22 NOTE — Progress Notes (Signed)
[facility] Progress Note  Subjective:   No c/o's. Denies further hemoptysis. Anxious for discharge. Needs to get out of bed.   Objective Filed Vitals:   09/21/13 2022 09/22/13 0230 09/22/13 0413 09/22/13 1008  BP: 110/58  103/57 107/58  Pulse: 62  63 63  Temp: 97.8 F (36.6 C)  97.7 F (36.5 C) 97.8 F (36.6 C)  TempSrc: Oral  Oral Oral  Resp: 18  18 18   Height:      Weight:  81.421 kg (179 lb 8 oz)    SpO2: 97%  97% 99%   Physical Exam General: alert, cooperative, NAD Heart: RRR Lungs:Decreased at bases, no appreciable rales or wheezes Abdomen: Soft, non-distended, + BS Extremities: No LE edema Dialysis Access: R AVF + bruit  Dialysis: [facility]WF [facility]  4h 85kg 3K/2.25Bath Heparin 3000 RUA AVF  Hectorol 12 Epo none Venofer none  Last labs: Hb 12.8 tsat 27% ferritin 840 phos 5.2 pth 481   Assessment/Plan: 1. PNA / hemoptysis  - Mgmt per primary. Abx narrowed to Levaquin. No further hemoptysis  2. ESRD - MWF, HD tomorrow with added K+ bath, no heparin 3. HTN/volume - SBPs 100s on Coreg and Bidil. Below dry wt by ~3.5kg with soft BPs. Will lower Coreg dose to 12.5 BID and monitor. UF as tolerated. Lower EDW at d/c 4. Anemia - Hgb 11.5. No esa or Fe for now  5. HPTH- Ca/phos in range, PTH 481, cont vit D, binders and sensipar  6. Systolic CHF- EF 0000000 7. Dispo - per primary. Stable from renal standpoint. Orders to sit up in the chair today. Plan for sleep study as outpatient.  Collene Leyden. Cletus Gash, PA-C [facility] Pager [phone] 09/22/2013,11:17 AM  LOS: 3 days   I have seen and examined patient, discussed with PA and agree with assessment and plan as outlined above. [name] MD pager [phone]    cell [phone] 09/22/2013, 1:55 PM     Additional Objective Labs: Basic Metabolic Panel:  Recent Labs Lab 09/19/13 2027 09/20/13 0700 09/21/13 0344  NA 137 139 136*  K 3.5* 3.8 3.6*  CL 95* 96 96  CO2  --  23 23  GLUCOSE 75 77 91   BUN 33* 41* 54*  CREATININE 4.50* 4.78* 6.04*  CALCIUM  --  8.7 8.4  PHOS  --  4.7* 4.6   Liver Function Tests:  Recent Labs Lab 09/20/13 0700 09/21/13 0344  AST 77*  --   ALT 69*  --   ALKPHOS 161*  --   BILITOT 3.0*  --   PROT 6.6  --   ALBUMIN 2.9* 2.5*   CBC:  Recent Labs Lab 09/19/13 1905 09/19/13 2027 09/20/13 0700 09/21/13 0344  WBC 5.1  --  4.5 3.6*  HGB 14.4 16.7 13.2 11.5*  HCT 41.2 49.0 37.8* 33.0*  MCV 100.5*  --  99.7 100.0  PLT 113*  --  101* 83*   Blood Culture    Component Value Date/Time   SDES URINE, CLEAN CATCH 09/20/2013 0950   SPECREQUEST NONE 09/20/2013 0950   REPTSTATUS 09/21/2013 FINAL 09/20/2013 0950    CBG:  Recent Labs Lab 09/21/13 2002 09/22/13 09/22/13 0410 09/22/13 0801 09/22/13 1052  GLUCAP 139* 124* 97 112* 118*   Studies/Results: No results found. Medications:   . calcium acetate  1,334 mg Oral TID WC  . carvedilol  25 mg Oral BID WC  . digoxin  0.125 mg Oral Daily  . docusate sodium  100 mg  Oral BID  . doxercalciferol  12 mcg Intravenous Q M,W,F-HD  . ezetimibe-simvastatin  1 tablet Oral QHS  . insulin aspart  0-9 Units Subcutaneous 6 times per day  . isosorbide-hydrALAZINE  1 tablet Oral BID  . levofloxacin  500 mg Oral Q48H  . pantoprazole  40 mg Oral QHS  . sodium chloride  3 mL Intravenous Q12H

## 2013-09-23 LAB — DRUG SCREEN PANEL (SERUM)

## 2013-10-19 ENCOUNTER — Ambulatory Visit (INDEPENDENT_AMBULATORY_CARE_PROVIDER_SITE_OTHER): Payer: Medicare Other | Admitting: *Deleted

## 2013-10-19 DIAGNOSIS — I428 Other cardiomyopathies: Secondary | ICD-10-CM

## 2013-10-19 DIAGNOSIS — Z95810 Presence of automatic (implantable) cardiac defibrillator: Secondary | ICD-10-CM

## 2013-10-20 ENCOUNTER — Encounter: Payer: Self-pay | Admitting: *Deleted

## 2013-10-26 LAB — MDC_IDC_ENUM_SESS_TYPE_REMOTE
HIGH POWER IMPEDANCE MEASURED VALUE: 55 Ohm
Lead Channel Impedance Value: 485 Ohm
Lead Channel Sensing Intrinsic Amplitude: 17.5 mV
Lead Channel Setting Pacing Amplitude: 2.4 V
Lead Channel Setting Pacing Pulse Width: 0.5 ms
MDC IDC PG SERIAL: 268731
MDC IDC STAT BRADY RV PERCENT PACED: 0 %
Zone Setting Detection Interval: 315.8 ms

## 2013-10-27 ENCOUNTER — Encounter: Payer: Self-pay | Admitting: *Deleted

## 2013-10-31 ENCOUNTER — Encounter: Payer: Self-pay | Admitting: *Deleted

## 2013-11-07 ENCOUNTER — Encounter: Payer: Self-pay | Admitting: Internal Medicine

## 2013-11-11 ENCOUNTER — Encounter (INDEPENDENT_AMBULATORY_CARE_PROVIDER_SITE_OTHER): Payer: Self-pay

## 2013-11-11 ENCOUNTER — Encounter: Payer: Self-pay | Admitting: Neurology

## 2013-11-11 ENCOUNTER — Ambulatory Visit (INDEPENDENT_AMBULATORY_CARE_PROVIDER_SITE_OTHER): Payer: Medicare Other | Admitting: Neurology

## 2013-11-11 VITALS — BP 101/58 | HR 62 | Resp 16 | Ht 67.0 in | Wt 180.0 lb

## 2013-11-11 DIAGNOSIS — Z992 Dependence on renal dialysis: Secondary | ICD-10-CM

## 2013-11-11 DIAGNOSIS — R0683 Snoring: Secondary | ICD-10-CM | POA: Insufficient documentation

## 2013-11-11 DIAGNOSIS — G4730 Sleep apnea, unspecified: Secondary | ICD-10-CM

## 2013-11-11 DIAGNOSIS — N186 End stage renal disease: Secondary | ICD-10-CM

## 2013-11-11 DIAGNOSIS — R0609 Other forms of dyspnea: Secondary | ICD-10-CM

## 2013-11-11 DIAGNOSIS — G4700 Insomnia, unspecified: Secondary | ICD-10-CM

## 2013-11-11 DIAGNOSIS — R0989 Other specified symptoms and signs involving the circulatory and respiratory systems: Secondary | ICD-10-CM

## 2013-11-11 NOTE — Progress Notes (Signed)
[facility]  Provider:  Larey Seat, M D  Referring Provider: Ulla Potash., MD Primary Care Physician:  Clent Demark, MD  Chief Complaint  Patient presents with  . New Evaluation    Room 10  . Sleep consult    HPI:  Timothy Faulkner is a 68 y.o. african [place] male,  is seen here as a referral  from [place] for a sleep evaluation.  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  He has been on hemodialysis since last September and has reportedly developed insomnia over the last couple of months. This is now a chronic finding - he falls asleep promptly while in a recliner transfers to bed and wakes up every 2 hours. waking up "jumpy" , sometimes appearing to choke , sometimes from limb jerks, and reported vivid dreams.  His daughter, Timothy Faulkner gives me additional information. Timothy Faulkner had lost consciousness in August 2014 and was diagnosed with very low blood sugars and reduced kidney function. He developed polyuria. Strong headaches once he started dialysis. He has first had a femoral cath, later subclavian and now has a matured AV Shunt in the right arm for access.  He developed congestive heart failure before renal failure.  She added that her father falls asleep more a often as he admitted and she estimated his Epworth at 20 points. He mainly watches TV and falls asleep in his recliner.   Patient goes to bed at 11.30 Pm but may have has 2-3 hours of sleep in front of the TV. He will sleep for another 2 hours, supported, seated  up, leaning on several pillows. He will wake up regularly every hour . He does not wake up regularly at one time.  Does not use an alarm, Mon and Wed, Fri he  is often woken by his daughter to get ready for dialysis.   Review of Systems: Out of a complete 14 system review, the patient complains of only the following symptoms, and all other reviewed systems are negative. Fatigue severity questioning at at 51  points, the Epworth sleepiness score at 18 points in the geriatric depression score at one point.    History   Social History  . Marital Status: Married    Spouse Name: Timothy Faulkner    Number of Children: 2  . Years of Education: 13   Occupational History  . Not on file.   Social History Main Topics  . Smoking status: Former Smoker -- 0.75 packs/day for 30 years    Types: Cigarettes    Quit date: 11/25/1992  . Smokeless tobacco: Never Used  . Alcohol Use: No     Comment: have not had a drink 2012  . Drug Use: No  . Sexual Activity: Yes   Other Topics Concern  . Not on file   Social History Narrative   Patient is married Timothy Faulkner and lives with his wife and child   Patient has two children.   Patient is retired.   Patient has a college education.   Patient is right-handed.   Patient drinks very little caffeine.          Family History  Problem Relation Age of Onset  . Heart disease Father   . CAD Father     Past Medical History  Diagnosis Date  . HYPERTENSION   . HYPERCHOLESTEROLEMIA, MIXED   . CHF   . DIAB W/O COMP TYPE II/UNS NOT STATED UNCNTRL   . Other  primary cardiomyopathies 07/16/2011  . Anemia   . Acute on chronic renal failure     /notes 03/11/2013 (03/11/2013)  . Heart murmur   . Automatic implantable cardioverter-defibrillator in situ     [employer]  . Pacemaker   . Shortness of breath   . GERD (gastroesophageal reflux disease)     Past Surgical History  Procedure Laterality Date  . Incision and drainage abscess / hematoma of bursa / knee / thigh Left 2012    "knee" (03/11/2013)  . Av fistula placement Right 12/13/2012    Procedure: ARTERIOVENOUS (AV) FISTULA CREATION;  Surgeon: [name], MD;  Location: [facility];  Service: Vascular;  Laterality: Right;  Ultrasound guided  . Cardiac defibrillator placement      [employer]  . Cardiac catheterization    . Insert / replace / remove pacemaker      Current Outpatient Prescriptions   Medication Sig Dispense Refill  . aspirin 81 MG EC tablet Take 1 tablet (81 mg total) by mouth daily. On hold for 10 days      . carvedilol (COREG) 25 MG tablet Take 0.5 tablets (12.5 mg total) by mouth 2 (two) times daily with a meal.      . digoxin (LANOXIN) 0.125 MG tablet Take 1 tablet (0.125 mg total) by mouth daily.  30 tablet  3  . ezetimibe-simvastatin (VYTORIN) 10-40 MG per tablet Take 1 tablet by mouth at bedtime.        . isosorbide-hydrALAZINE (BIDIL) 20-37.5 MG per tablet Take 1 tablet by mouth 2 (two) times daily.        . Vitamin D, Ergocalciferol, (DRISDOL) 50000 UNITS CAPS Take 50,000 Units by mouth every 7 (seven) days. mondays       No current facility-administered medications for this visit.    Allergies as of 11/11/2013  . (No Known Allergies)    Vitals: BP 101/58  Pulse 62  Resp 16  Ht 5\' 7"  (1.702 m)  Wt 180 lb (81.647 kg)  BMI 28.19 kg/m2 Last Weight:  Wt Readings from Last 1 Encounters:  11/11/13 180 lb (81.647 kg)   Last Height:   Ht Readings from Last 1 Encounters:  11/11/13 5\' 7"  (1.702 m)    Physical exam:  General: The patient is awake, alert and appears not in acute distress. The patient is well groomed. Head: Normocephalic, atraumatic. Neck is supple. Mallampati 4 neck circumference: 17.5 ,  no TMJ, delayed swollowing. Cardiovascular:  Regular rate and rhythm, without  murmurs or carotid bruit, and without distended neck veins. Respiratory: Lungs are clear to auscultation. Skin:  Without evidence of edema, or rash Trunk: BMI is  elevated and patient  has normal posture.  Neurologic exam : The patient is awake and alert, oriented to place and time.   Memory subjective described as intact. There is a normal attention span & concentration ability.  Speech is fluent without  dysarthria, dysphonia or aphasia.  Mood and affect are appropriate.  Cranial nerves: Pupils are equal and briskly reactive to light. Funduscopic exam without  Edema.   Extraocular movements  in vertical and horizontal planes intact and without nystagmus. Visual fields by finger perimetry are intact. Hearing to finger rub intact.  Facial sensation intact to fine touch. Facial motor strength is symmetric and tongue and uvula move midline.  Motor exam:  Normal tone, muscle bulk and symmetric normal strength in all extremities.  Sensory:  Fine touch, pinprick and vibration were tested in all extremities. Proprioception  is reduced , grip strength is reduced.  Coordination: Rapid alternating movements in the fingers/hands is tested and normal. Finger-to-nose maneuver tested and normal without evidence of ataxia, dysmetria or tremor.  Gait and station: Patient walks without assistive device .  Deep tendon reflexes: in the upper and lower extremities are symmetric and intact. Babinski  downgoing.   Assessment:  After physical and neurologic examination, review of laboratory studies, imaging, neurophysiology testing and pre-existing records, assessment is   ESRD on Haemodialysis, with CHF,  and he has been increasingly fatigued and excessively daytime sleepy.   Plan:  Treatment plan and additional workup :  SPLIT study , patient will need 3 pillows.  Split at AHI 15 and 45 score.  Get CO2 and suplement oxygen if patient is hypoxic without significant apnea.  Document hypoxemia response to 02.

## 2013-11-11 NOTE — Patient Instructions (Signed)

## 2013-12-26 DIAGNOSIS — E441 Mild protein-calorie malnutrition: Secondary | ICD-10-CM | POA: Insufficient documentation

## 2014-01-26 ENCOUNTER — Ambulatory Visit (INDEPENDENT_AMBULATORY_CARE_PROVIDER_SITE_OTHER): Payer: Medicare Other | Admitting: *Deleted

## 2014-01-26 DIAGNOSIS — Z95810 Presence of automatic (implantable) cardiac defibrillator: Secondary | ICD-10-CM

## 2014-01-26 DIAGNOSIS — I428 Other cardiomyopathies: Secondary | ICD-10-CM

## 2014-01-26 LAB — MDC_IDC_ENUM_SESS_TYPE_REMOTE
Brady Statistic RV Percent Paced: 0 %
Implantable Pulse Generator Serial Number: 268731
MDC IDC MSMT LEADCHNL RV IMPEDANCE VALUE: 526 Ohm
MDC IDC MSMT LEADCHNL RV SENSING INTR AMPL: 25 mV
MDC IDC SET LEADCHNL RV PACING AMPLITUDE: 2.4 V
MDC IDC SET LEADCHNL RV PACING PULSEWIDTH: 0.5 ms
Zone Setting Detection Interval: 315.8 ms

## 2014-01-26 NOTE — Progress Notes (Signed)
Remote ICD transmission.   

## 2014-02-15 ENCOUNTER — Encounter: Payer: Self-pay | Admitting: Cardiology

## 2014-02-21 ENCOUNTER — Encounter: Payer: Self-pay | Admitting: Internal Medicine

## 2014-02-23 DIAGNOSIS — F419 Anxiety disorder, unspecified: Secondary | ICD-10-CM | POA: Insufficient documentation

## 2014-02-23 DIAGNOSIS — R52 Pain, unspecified: Secondary | ICD-10-CM | POA: Insufficient documentation

## 2014-02-23 DIAGNOSIS — L299 Pruritus, unspecified: Secondary | ICD-10-CM | POA: Insufficient documentation

## 2014-02-23 DIAGNOSIS — R509 Fever, unspecified: Secondary | ICD-10-CM | POA: Insufficient documentation

## 2014-02-23 DIAGNOSIS — R519 Headache, unspecified: Secondary | ICD-10-CM | POA: Insufficient documentation

## 2014-03-18 DIAGNOSIS — E1151 Type 2 diabetes mellitus with diabetic peripheral angiopathy without gangrene: Secondary | ICD-10-CM | POA: Insufficient documentation

## 2014-05-01 ENCOUNTER — Telehealth: Payer: Self-pay | Admitting: Cardiology

## 2014-05-01 ENCOUNTER — Encounter: Payer: Medicare Other | Admitting: *Deleted

## 2014-05-01 NOTE — Telephone Encounter (Signed)
LMOVM reminding pt to send remote transmission.   

## 2014-05-02 ENCOUNTER — Encounter: Payer: Self-pay | Admitting: Cardiology

## 2014-07-10 ENCOUNTER — Telehealth: Payer: Self-pay | Admitting: Cardiovascular Disease

## 2014-07-10 NOTE — Telephone Encounter (Signed)
Received records from [facility] [place] (Dr Jed Limerick) for appointment with Dr Gwenlyn Found 07/13/14.  Records given to [facility] (medical records) for Dr Kennon Holter schedule on 07/13/14. lp

## 2014-07-11 ENCOUNTER — Encounter: Payer: Self-pay | Admitting: *Deleted

## 2014-07-12 NOTE — Telephone Encounter (Signed)
Closed encounter °

## 2014-07-13 ENCOUNTER — Ambulatory Visit (INDEPENDENT_AMBULATORY_CARE_PROVIDER_SITE_OTHER): Payer: Medicare Other | Admitting: Cardiovascular Disease

## 2014-07-13 ENCOUNTER — Encounter: Payer: Self-pay | Admitting: Cardiovascular Disease

## 2014-07-13 VITALS — BP 119/77 | HR 78 | Ht 68.0 in | Wt 188.5 lb

## 2014-07-13 DIAGNOSIS — L089 Local infection of the skin and subcutaneous tissue, unspecified: Secondary | ICD-10-CM

## 2014-07-13 DIAGNOSIS — L97529 Non-pressure chronic ulcer of other part of left foot with unspecified severity: Secondary | ICD-10-CM

## 2014-07-13 DIAGNOSIS — Z95810 Presence of automatic (implantable) cardiac defibrillator: Secondary | ICD-10-CM

## 2014-07-13 DIAGNOSIS — E11628 Type 2 diabetes mellitus with other skin complications: Secondary | ICD-10-CM | POA: Insufficient documentation

## 2014-07-13 DIAGNOSIS — E11621 Type 2 diabetes mellitus with foot ulcer: Secondary | ICD-10-CM

## 2014-07-13 DIAGNOSIS — I10 Essential (primary) hypertension: Secondary | ICD-10-CM

## 2014-07-13 NOTE — Assessment & Plan Note (Signed)
The patient was referred by Dr. Trellis Paganini at [facility] for peripheral vascular evaluation due to a slowly healing ulcer on left fifth toe as a result of self-inflicted trauma.Timothy Faulkner has been a diabetic for years. He is also on hemodialysis. He caused some trauma to his left foot about a month ago and then went to see his podiatrist. The ulcer has been slowly healing. The patient denies claudication but does have what sounds like diabetic peripheral neuropathy. He was referred here for peripheral vascular evaluation. The wound appears dry and shallow. Does not appear infected. He has diminished pedal pulses. I'm going to obtain arterial Doppler studies and we'll see him back in one month for follow-up.

## 2014-07-13 NOTE — Progress Notes (Signed)
07/13/2014 Timothy Faulkner   Dec 21, 1945  [id number]  Primary Physician Clent Demark, MD Primary Cardiologist: Lorretta Harp MD Renae Gloss   HPI:  Timothy Faulkner is a 68 year old mildly overweight married African-American male father of 2 children, grandfather acute grandchildren referred by Dr. Trellis Paganini at [facility] for peripheral vascular evaluation because of a slowly healing ulcer on his left fifth toe. His primary care physician is [name] and his cardiologist Dr. Crissie Sickles who follows him for his ICD. His other problems include hypertension, hyperlipidemia, diabetes and chronic renal insufficiency on hemodialysis for the last year. 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 He was manipulating his own feet prior to appointment with his podiatrist and ended up injuring his left fifth toe which has been slow to heal. He was referred back to [place] for peripheral vascular evaluation to assess potential for healing and his lower extremity circulation. He denies claudication. He does have what sounds like diabetic peripheral neuropathy.  Current Outpatient Prescriptions  Medication Sig Dispense Refill  . aspirin 81 MG EC tablet Take 1 tablet (81 mg total) by mouth daily. On hold for 10 days    . carvedilol (COREG) 25 MG tablet Take 0.5 tablets (12.5 mg total) by mouth 2 (two) times daily with a meal.    . digoxin (LANOXIN) 0.125 MG tablet Take 1 tablet (0.125 mg total) by mouth daily. 30 tablet 3  . ezetimibe-simvastatin (VYTORIN) 10-40 MG per tablet Take 1 tablet by mouth at bedtime.      . isosorbide-hydrALAZINE (BIDIL) 20-37.5 MG per tablet Take 1 tablet by mouth 2 (two) times daily.      . Vitamin D, Ergocalciferol, (DRISDOL) 50000 UNITS CAPS Take 50,000 Units by mouth every 7 (seven) days. mondays     No current facility-administered medications for this visit.    No Known Allergies  History   Social History  . Marital Status:  Married    Spouse Name: Timothy Faulkner    Number of Children: 2  . Years of Education: 13   Occupational History  . Not on file.   Social History Main Topics  . Smoking status: Former Smoker -- 0.75 packs/day for 30 years    Types: Cigarettes    Quit date: 11/25/1992  . Smokeless tobacco: Never Used  . Alcohol Use: No     Comment: have not had a drink 2012  . Drug Use: No  . Sexual Activity: Yes   Other Topics Concern  . Not on file   Social History Narrative   Patient is married Timothy Faulkner and lives with his wife and child   Patient has two children.   Patient is retired.   Patient has a college education.   Patient is right-handed.   Patient drinks very little caffeine.           Review of Systems: General: negative for chills, fever, night sweats or weight changes.  Cardiovascular: negative for chest pain, dyspnea on exertion, edema, orthopnea, palpitations, paroxysmal nocturnal dyspnea or shortness of breath Dermatological: negative for rash Respiratory: negative for cough or wheezing Urologic: negative for hematuria Abdominal: negative for nausea, vomiting, diarrhea, bright red blood per rectum, melena, or hematemesis Neurologic: negative for visual changes, syncope, or dizziness All other systems reviewed and are otherwise negative except as noted above.    Blood pressure 119/77, pulse 78, height 5\' 8"  (1.727 m), weight 188 lb 8 oz (85.503 kg).  General appearance: alert and no  distress Neck: no adenopathy, no carotid bruit, no JVD, supple, symmetrical, trachea midline and thyroid not enlarged, symmetric, no tenderness/mass/nodules Lungs: clear to auscultation bilaterally Heart: regular rate and rhythm, S1, S2 normal, no murmur, click, rub or gallop Extremities: extremities normal, atraumatic, no cyanosis or edema and absent pedal pulses, shallow dry appearing slowly healing ulcer left fifth toe  EKG sinus rhythm at 78 with nonspecific IVCD and left axis deviation. 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 Trellis Paganini at [facility] for peripheral vascular evaluation due to a slowly healing ulcer on left fifth toe as a result of self-inflicted trauma.Mr. Dada has been a diabetic for years. He is also on hemodialysis. He caused some trauma to his left foot about a month ago and then went to see his podiatrist. The ulcer has been slowly healing. The patient denies claudication but does have what sounds like diabetic peripheral neuropathy. He was referred here for peripheral vascular evaluation. The wound appears dry and shallow. Does not appear infected. He has diminished pedal pulses. I'm going to obtain arterial Doppler studies and we'll see him back in one month for follow-up.      Lorretta Harp MD FACP,FACC,FAHA, [facility] 07/13/2014 2:20 PM

## 2014-07-13 NOTE — Patient Instructions (Signed)
  We will see you back in follow up in 1 month with Dr Gwenlyn Found.   Dr Gwenlyn Found has ordered: lower extremity arterial doppler- During this test, ultrasound is used to evaluate arterial blood flow in the legs. Allow approximately one hour for this exam.

## 2014-07-20 ENCOUNTER — Encounter (HOSPITAL_COMMUNITY): Payer: Self-pay | Admitting: Surgery

## 2014-07-27 ENCOUNTER — Ambulatory Visit (HOSPITAL_COMMUNITY)
Admission: RE | Admit: 2014-07-27 | Discharge: 2014-07-27 | Disposition: A | Discharge status: Home / Self Care | Payer: Medicare Other | Source: Ambulatory Visit | Attending: Cardiovascular Disease | Admitting: Cardiovascular Disease

## 2014-07-27 DIAGNOSIS — L97529 Non-pressure chronic ulcer of other part of left foot with unspecified severity: Secondary | ICD-10-CM | POA: Diagnosis present

## 2014-07-27 NOTE — Progress Notes (Signed)
Arterial Duplex Lower Ext. Completed. Deegan Valentino, BS, RDMS, RVT  

## 2014-08-08 ENCOUNTER — Ambulatory Visit (INDEPENDENT_AMBULATORY_CARE_PROVIDER_SITE_OTHER): Payer: Medicare Other | Admitting: Cardiovascular Disease

## 2014-08-08 ENCOUNTER — Encounter: Payer: Self-pay | Admitting: Cardiovascular Disease

## 2014-08-08 VITALS — BP 122/78 | HR 72 | Ht 68.0 in | Wt 195.3 lb

## 2014-08-08 DIAGNOSIS — L97529 Non-pressure chronic ulcer of other part of left foot with unspecified severity: Secondary | ICD-10-CM

## 2014-08-08 DIAGNOSIS — E11621 Type 2 diabetes mellitus with foot ulcer: Secondary | ICD-10-CM

## 2014-08-08 NOTE — Assessment & Plan Note (Signed)
I saw [name] on 07/13/14. He had a slowly healing left fifth toe ulcer. Over the last month this is continued to heal. His lower extremity arterial Doppler studies performed 07/27/14 revealed bilateral ABIs in the mid 0.7 range with a occluded left dorsalis pedis. He scheduled to see Dr. Geroge Baseman back next week. I will see him back when necessary.

## 2014-08-08 NOTE — Patient Instructions (Signed)
Please follow up with Dr. Gwenlyn Found as needed.

## 2014-08-08 NOTE — Progress Notes (Signed)
Timothy Faulkner returns for follow-up. I last saw Timothy Faulkner approximately one month ago for slowly healing left fifth toe ulcer. His Dopplers performed 07/27/14 revealed a left ABI 0.75 with a cold with an occluded left dorsalis pedis. The ulcer has continued to heal. He is scheduled to see Dr. Bernette Mayers back next week. At this point, there is no indication that he has compromised circulation requires any additional workup. I will see Timothy Faulkner back when necessary.  Lorretta Harp, M.D., [place], [facility], [place] [address] [place], [place]  [id number]  [phone] 08/08/2014 3:01 PM

## 2014-08-22 ENCOUNTER — Encounter: Payer: Self-pay | Admitting: Internal Medicine

## 2014-08-22 ENCOUNTER — Ambulatory Visit (INDEPENDENT_AMBULATORY_CARE_PROVIDER_SITE_OTHER): Payer: Medicare Other | Admitting: Internal Medicine

## 2014-08-22 VITALS — BP 88/52 | HR 70 | Ht 68.0 in | Wt 185.0 lb

## 2014-08-22 DIAGNOSIS — E7800 Pure hypercholesterolemia, unspecified: Secondary | ICD-10-CM

## 2014-08-22 DIAGNOSIS — I5022 Chronic systolic (congestive) heart failure: Secondary | ICD-10-CM

## 2014-08-22 DIAGNOSIS — I10 Essential (primary) hypertension: Secondary | ICD-10-CM

## 2014-08-22 DIAGNOSIS — I259 Chronic ischemic heart disease, unspecified: Secondary | ICD-10-CM

## 2014-08-22 DIAGNOSIS — Z95810 Presence of automatic (implantable) cardiac defibrillator: Secondary | ICD-10-CM

## 2014-08-22 LAB — MDC_IDC_ENUM_SESS_TYPE_INCLINIC
Date Time Interrogation Session: 20160112050000
HIGH POWER IMPEDANCE MEASURED VALUE: 84 Ohm
HighPow Impedance: 58 Ohm
Implantable Pulse Generator Serial Number: 268731
Lead Channel Impedance Value: 638 Ohm
Lead Channel Pacing Threshold Amplitude: 0.7 V
Lead Channel Pacing Threshold Pulse Width: 0.5 ms
Lead Channel Sensing Intrinsic Amplitude: 25 mV
Lead Channel Setting Pacing Amplitude: 2.4 V
Lead Channel Setting Sensing Sensitivity: 0.5 mV
MDC IDC SET LEADCHNL RV PACING PULSEWIDTH: 0.5 ms
MDC IDC SET ZONE DETECTION INTERVAL: 316 ms

## 2014-08-22 MED ORDER — DIGOXIN 125 MCG PO TABS: 0.1250 mg | ORAL_TABLET | Freq: Every day | ORAL | Status: DC

## 2014-08-22 MED ORDER — CARVEDILOL 3.125 MG PO TABS: 3.1250 mg | ORAL_TABLET | Freq: Two times a day (BID) | ORAL | Status: DC

## 2014-08-22 MED ORDER — ISOSORB DINITRATE-HYDRALAZINE 20-37.5 MG PO TABS: 1.0000 | ORAL_TABLET | Freq: Every day | ORAL | Status: DC

## 2014-08-22 MED ORDER — EZETIMIBE-SIMVASTATIN 10-40 MG PO TABS: 1.0000 | ORAL_TABLET | Freq: Every day | ORAL | Status: DC

## 2014-08-22 NOTE — Assessment & Plan Note (Signed)
Now that his volume status is controled with HD and he has lost weight, he has had lower blood pressure. I have asked him to reduce his coreg to 3.125 mg twice daily and reduce his bidil to once daily. Will check his digoxin level. I'll see him back in a year. He will f/u with his primary cardiologist.

## 2014-08-22 NOTE — Patient Instructions (Addendum)
Your physician wants you to follow-up in: 12 months with Dr. Knox Saliva will receive a reminder letter in the mail two months in advance. If you don't receive a letter, please call our office to schedule the follow-up appointment.  Remote monitoring is used to monitor your Pacemaker or ICD from home. This monitoring reduces the number of office visits required to check your device to one time per year. It allows [place] to keep an eye on the functioning of your device to ensure it is working properly. You are scheduled for a device check from home on 11/21/14. You may send your transmission at any time that day. If you have a wireless device, the transmission will be sent automatically. After your physician reviews your transmission, you will receive a postcard with your next transmission date.   Your physician has recommended you make the following change in your medication:  1) decrease Carvedilol to 3.125mg  twice daily 2) Only take your Bidil once daily  Your physician recommends that you return for lab work today

## 2014-08-22 NOTE — Assessment & Plan Note (Signed)
He will continue vytorin. He will continue a low fat diet and weight loss.

## 2014-08-22 NOTE — Assessment & Plan Note (Signed)
His [employer] ICD is working normally. Will recheck in several months. 

## 2014-08-22 NOTE — Assessment & Plan Note (Signed)
His symptoms are class 2 but more in the way of low output. I have asked him to reduce his coreg and bidil.

## 2014-08-22 NOTE — Progress Notes (Signed)
HPI [name] returns today for followup. He is a pleasant 69 yo man with ESRD on HD, chronic systolic heart failure and s/p ICD implant. He continues to lose weight by watching his diet. He denies chest pain, sob, or peripheral edema. He does have problems with dizziness and orthostasis. No ICD shock. No Known Allergies   Current Outpatient Prescriptions  Medication Sig Dispense Refill  . aspirin 81 MG EC tablet Take 1 tablet (81 mg total) by mouth daily. On hold for 10 days (Patient taking differently: Take 81 mg by mouth daily. )    . carvedilol (COREG) 25 MG tablet Take 0.5 tablets (12.5 mg total) by mouth 2 (two) times daily with a meal.    . Cholecalciferol (VITAMIN D PO) Take 2 tablets by mouth 3 (three) times a week.    . digoxin (LANOXIN) 0.125 MG tablet Take 1 tablet (0.125 mg total) by mouth daily. 30 tablet 3  . ezetimibe-simvastatin (VYTORIN) 10-40 MG per tablet Take 1 tablet by mouth at bedtime.      . isosorbide-hydrALAZINE (BIDIL) 20-37.5 MG per tablet Take 1 tablet by mouth 2 (two) times daily.       No current facility-administered medications for this visit.     Past Medical History  Diagnosis Date  . HYPERTENSION   . HYPERCHOLESTEROLEMIA, MIXED   . CHF   . DIAB W/O COMP TYPE II/UNS NOT STATED UNCNTRL   . 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 Anemia   . Acute on chronic renal failure     /notes 03/11/2013 (03/11/2013)  . Heart murmur   . Automatic implantable cardioverter-defibrillator in situ     [employer]  . Pacemaker   . Shortness of breath   . GERD (gastroesophageal reflux disease)   . 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 Peripheral arterial disease     left fifth toe ulcer, healing    ROS:   All systems reviewed and negative except as noted in the HPI.   Past Surgical History  Procedure Laterality Date  . Incision and drainage abscess / hematoma of bursa / knee / thigh Left 2012   "knee" (03/11/2013)  . Av fistula placement Right 12/13/2012    Procedure: ARTERIOVENOUS (AV) FISTULA CREATION;  Surgeon: [name], MD;  Location: [place];  Service: Vascular;  Laterality: Right;  Ultrasound guided  . Cardiac defibrillator placement      [employer]  . Cardiac catheterization    . Insert / replace / remove pacemaker    . Shuntogram N/A 05/31/2013    Procedure: Earney Mallet;  Surgeon: [name], MD;  Location: [facility] CATH LAB;  Service: Cardiovascular;  Laterality: N/A;     Family History  Problem Relation Age of Onset  . Heart disease Father   . CAD Father      History   Social History  . Marital Status: Married    Spouse Name: [name]    Number of Children: 2  . Years of Education: 13   Occupational History  . Not on file.   Social History Main Topics  . Smoking status: Former Smoker -- 0.75 packs/day for 30 years    Types: Cigarettes    Quit date: 11/25/1992  . Smokeless tobacco: Never Used  . Alcohol Use: No     Comment: have not had a drink 2012  . Drug Use: No  . Sexual Activity: Yes  Other Topics Concern  . Not on file   Social History Narrative   Patient is married [name] and lives with his wife and child   Patient has two children.   Patient is retired.   Patient has a college education.   Patient is right-handed.   Patient drinks very little caffeine.           BP 88/52 mmHg  Pulse 70  Ht 5\' 8"  (1.727 m)  Wt 185 lb (83.915 kg)  BMI 28.14 kg/m2  Physical Exam:  Well appearing 69 yo man, NAD HEENT: Unremarkable Neck:  No JVD, no thyromegally Lymphatics:  No adenopathy Back:  No CVA tenderness Lungs:  Clear with no wheezes HEART:  Regular rate rhythm, no murmurs, no rubs, no clicks Abd:  soft, positive bowel sounds, no organomegally, no rebound, no guarding Ext:  2 plus pulses, no edema, no cyanosis, no clubbing Skin:  No rashes no nodules Neuro:  CN II through XII intact, motor grossly intact   DEVICE  Normal  device function.  See PaceArt for details.   Assess/Plan:

## 2014-08-23 LAB — DIGOXIN LEVEL: Digoxin Level: 0.6 ng/mL — ABNORMAL LOW (ref 0.8–2.0)

## 2014-08-31 ENCOUNTER — Encounter: Payer: Self-pay | Admitting: Internal Medicine

## 2014-11-21 ENCOUNTER — Ambulatory Visit (INDEPENDENT_AMBULATORY_CARE_PROVIDER_SITE_OTHER): Payer: Medicare Other | Admitting: *Deleted

## 2014-11-21 DIAGNOSIS — I428 Other cardiomyopathies: Secondary | ICD-10-CM

## 2014-11-21 DIAGNOSIS — I429 Cardiomyopathy, unspecified: Secondary | ICD-10-CM

## 2014-11-21 LAB — MDC_IDC_ENUM_SESS_TYPE_REMOTE
Battery Remaining Longevity: 108 mo
Brady Statistic RV Percent Paced: 0 %
Date Time Interrogation Session: 20160412053200
HighPow Impedance: 72 Ohm
Lead Channel Setting Sensing Sensitivity: 0.5 mV
MDC IDC MSMT BATTERY REMAINING PERCENTAGE: 100 %
MDC IDC MSMT LEADCHNL RV IMPEDANCE VALUE: 562 Ohm
MDC IDC PG SERIAL: 268731
MDC IDC SET LEADCHNL RV PACING AMPLITUDE: 2.4 V
MDC IDC SET LEADCHNL RV PACING PULSEWIDTH: 0.5 ms
Zone Setting Detection Interval: 316 ms

## 2014-11-21 NOTE — Progress Notes (Signed)
Remote ICD transmission.   

## 2014-12-04 ENCOUNTER — Encounter: Payer: Self-pay | Admitting: Cardiology

## 2014-12-06 ENCOUNTER — Encounter: Payer: Self-pay | Admitting: Internal Medicine

## 2015-02-05 DIAGNOSIS — R0602 Shortness of breath: Secondary | ICD-10-CM | POA: Insufficient documentation

## 2015-02-21 ENCOUNTER — Ambulatory Visit (INDEPENDENT_AMBULATORY_CARE_PROVIDER_SITE_OTHER): Payer: Medicare Other | Admitting: *Deleted

## 2015-02-21 DIAGNOSIS — I429 Cardiomyopathy, unspecified: Secondary | ICD-10-CM

## 2015-02-21 DIAGNOSIS — I428 Other cardiomyopathies: Secondary | ICD-10-CM

## 2015-02-21 NOTE — Progress Notes (Signed)
Remote ICD transmission.   

## 2015-02-27 LAB — CUP PACEART REMOTE DEVICE CHECK
Date Time Interrogation Session: 20160713053100
HIGH POWER IMPEDANCE MEASURED VALUE: 95 Ohm
Lead Channel Setting Pacing Pulse Width: 0.5 ms
Lead Channel Setting Sensing Sensitivity: 0.5 mV
MDC IDC MSMT BATTERY REMAINING LONGEVITY: 108 mo
MDC IDC MSMT BATTERY REMAINING PERCENTAGE: 100 %
MDC IDC MSMT LEADCHNL RV IMPEDANCE VALUE: 626 Ohm
MDC IDC PG SERIAL: 268731
MDC IDC SET LEADCHNL RV PACING AMPLITUDE: 2.4 V
MDC IDC STAT BRADY RV PERCENT PACED: 0 %
Zone Setting Detection Interval: 316 ms

## 2015-03-16 ENCOUNTER — Encounter: Payer: Self-pay | Admitting: Cardiology

## 2015-03-21 ENCOUNTER — Encounter: Payer: Self-pay | Admitting: Internal Medicine

## 2015-05-22 ENCOUNTER — Telehealth: Payer: Self-pay | Admitting: Internal Medicine

## 2015-05-22 NOTE — Telephone Encounter (Signed)
New message     Daughter calling and recommendation on primary care MD.

## 2015-05-22 NOTE — Telephone Encounter (Signed)
I have asked her to contact Dr Posey Pronto to discuss.  She says he is currently following with Dr Terrence Dupont but not satisfied.  She is going to call Dr Posey Pronto to discuss

## 2015-05-24 ENCOUNTER — Ambulatory Visit (INDEPENDENT_AMBULATORY_CARE_PROVIDER_SITE_OTHER): Payer: Medicare Other | Admitting: *Deleted

## 2015-05-24 DIAGNOSIS — I429 Cardiomyopathy, unspecified: Secondary | ICD-10-CM | POA: Diagnosis not present

## 2015-05-24 DIAGNOSIS — I428 Other cardiomyopathies: Secondary | ICD-10-CM

## 2015-05-24 NOTE — Progress Notes (Signed)
Remote ICD transmission.   

## 2015-05-30 DIAGNOSIS — Z992 Dependence on renal dialysis: Secondary | ICD-10-CM | POA: Insufficient documentation

## 2015-06-14 LAB — CUP PACEART REMOTE DEVICE CHECK
Battery Remaining Longevity: 102 mo
Battery Remaining Percentage: 100 %
Brady Statistic RV Percent Paced: 0 %
HighPow Impedance: 91 Ohm
Implantable Lead Implant Date: 20111118
Implantable Lead Location: 753860
Implantable Lead Model: 181
Implantable Lead Serial Number: 313758
Lead Channel Impedance Value: 606 Ohm
Lead Channel Setting Pacing Amplitude: 2.4 V
Lead Channel Setting Sensing Sensitivity: 0.5 mV
MDC IDC MSMT LEADCHNL RV SENSING INTR AMPL: 24.7 mV
MDC IDC SESS DTM: 20161013053200
MDC IDC SET LEADCHNL RV PACING PULSEWIDTH: 0.5 ms
Pulse Gen Serial Number: 268731

## 2015-06-15 ENCOUNTER — Encounter: Payer: Self-pay | Admitting: Cardiology

## 2015-08-28 ENCOUNTER — Encounter: Payer: Medicare Other | Admitting: Internal Medicine

## 2015-09-27 ENCOUNTER — Encounter: Payer: Medicare Other | Admitting: Internal Medicine

## 2015-10-18 ENCOUNTER — Ambulatory Visit (INDEPENDENT_AMBULATORY_CARE_PROVIDER_SITE_OTHER): Payer: Medicare Other | Admitting: Vascular Surgery

## 2015-10-18 ENCOUNTER — Other Ambulatory Visit: Payer: Self-pay

## 2015-10-18 ENCOUNTER — Encounter: Payer: Self-pay | Admitting: Vascular Surgery

## 2015-10-18 VITALS — BP 144/86 | HR 75 | Temp 97.8°F | Resp 18 | Ht 68.0 in | Wt 196.1 lb

## 2015-10-18 DIAGNOSIS — T82590A Other mechanical complication of surgically created arteriovenous fistula, initial encounter: Secondary | ICD-10-CM | POA: Diagnosis not present

## 2015-10-18 NOTE — Progress Notes (Signed)
Patient is a 70 year old male referred by Dr. 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   The patient denies any prior bleeding episodes. He is scheduled for a fistulogram possible intervention due to outflow stenosis on next Tuesday at [facility]. Fistula was placed in may of 2014. Chronic medical problems include elevated cholesterol, hypertension, congestive failure, diabetes all of which are currently stable. He also has an AICD.  Past Medical History  Diagnosis Date  . HYPERTENSION   . HYPERCHOLESTEROLEMIA, MIXED   . CHF   . DIAB W/O COMP TYPE II/UNS NOT STATED UNCNTRL   . 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 Anemia   . Acute on chronic renal failure ([place])     [name] 03/11/2013 (03/11/2013)  . Heart murmur   . Automatic implantable cardioverter-defibrillator in situ     [employer]  . Pacemaker   . Shortness of breath   . GERD (gastroesophageal reflux disease)   . 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 Peripheral arterial disease (HCC)     left fifth toe ulcer, healing   Past Surgical History  Procedure Laterality Date  . Incision and drainage abscess / hematoma of bursa / knee / thigh Left 2012    "knee" (03/11/2013)  . Av fistula placement Right 12/13/2012    Procedure: ARTERIOVENOUS (AV) FISTULA CREATION;  Surgeon: [name], MD;  Location: [place];  Service: Vascular;  Laterality: Right;  Ultrasound guided  . Cardiac defibrillator placement      [employer]  . Cardiac catheterization    . Insert / replace / remove pacemaker    . Shuntogram N/A 05/31/2013    Procedure: Earney Mallet;  Surgeon: [name], MD;  Location: [facility] CATH LAB;  Service: Cardiovascular;  Laterality: N/A;      Review of systems: He denies shortness of breath. He denies chest pain.  Current Outpatient Prescriptions on File Prior to Visit  Medication Sig Dispense Refill  . aspirin 81 MG EC tablet Take  1 tablet (81 mg total) by mouth daily. On hold for 10 days (Patient taking differently: Take 81 mg by mouth daily. )    . carvedilol (COREG) 3.125 MG tablet Take 1 tablet (3.125 mg total) by mouth 2 (two) times daily. 180 tablet 3  . Cholecalciferol (VITAMIN D PO) Take 2 tablets by mouth 3 (three) times a week. Reported on 10/18/2015    . digoxin (LANOXIN) 0.125 MG tablet Take 1 tablet (0.125 mg total) by mouth daily. (Patient not taking: Reported on 10/18/2015) 90 tablet 3  . ezetimibe-simvastatin (VYTORIN) 10-40 MG per tablet Take 1 tablet by mouth at bedtime. (Patient not taking: Reported on 10/18/2015) 90 tablet 3  . isosorbide-hydrALAZINE (BIDIL) 20-37.5 MG per tablet Take 1 tablet by mouth daily. (Patient not taking: Reported on 10/18/2015)     No current facility-administered medications on file prior to visit.    Physical exam:  Filed Vitals:   10/18/15 1239 10/18/15 1242  BP: 165/103 144/86  Pulse: 75   Temp: 97.8 F (36.6 C)   TempSrc: Oral   Resp: 18   Height: 5\' 8"  (1.727 m)   Weight: 196 lb 1.6 oz (88.95 kg)   SpO2: 99%      Chest: Clear to auscultation bilaterally. Cardiac: Regular rate and rhythm  Extremities: Right upper extremity pulsatile right upper arm AV fistula with 2 areas of aneurysmal degeneration 3  cm diameter the more distal aneurysm near the antecubital fossa has an area of whitish thinned out skin with a small area of erosion   Assessment: fistula with erosion will need revision and repair of this. The patient is scheduled for a shuntogram and possible intervention next week. 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 The patient will be scheduled for revision of his fistula following Tuesday March 21   Plan: Revision right arm AV fistula March 21 area risk benefits possible claudication and procedure details were discussed patient today including but limited to bleeding infection most of fistula. He understands  agrees to proceed  Ruta Hinds, MD [facility] [place] Office: [phone] Pager: [phone]

## 2015-10-18 NOTE — Progress Notes (Signed)
Filed Vitals:   10/18/15 1239 10/18/15 1242  BP: 165/103 144/86  Pulse: 75   Temp: 97.8 F (36.6 C)   TempSrc: Oral   Resp: 18   Height: 5\' 8"  (1.727 m)   Weight: 196 lb 1.6 oz (88.95 kg)   SpO2: 99%

## 2015-10-23 ENCOUNTER — Ambulatory Visit (INDEPENDENT_AMBULATORY_CARE_PROVIDER_SITE_OTHER): Payer: Medicare Other | Admitting: Internal Medicine

## 2015-10-23 ENCOUNTER — Encounter: Payer: Self-pay | Admitting: Internal Medicine

## 2015-10-23 VITALS — BP 130/80 | HR 84 | Ht 68.0 in | Wt 191.8 lb

## 2015-10-23 DIAGNOSIS — I5022 Chronic systolic (congestive) heart failure: Secondary | ICD-10-CM

## 2015-10-23 LAB — CUP PACEART INCLINIC DEVICE CHECK
HIGH POWER IMPEDANCE MEASURED VALUE: 95 Ohm
HighPow Impedance: 58 Ohm
Implantable Lead Location: 753860
Lead Channel Impedance Value: 615 Ohm
Lead Channel Pacing Threshold Amplitude: 0.8 V
Lead Channel Setting Pacing Amplitude: 2.4 V
Lead Channel Setting Pacing Pulse Width: 0.5 ms
Lead Channel Setting Sensing Sensitivity: 0.5 mV
MDC IDC LEAD IMPLANT DT: 20111118
MDC IDC LEAD MODEL: 181
MDC IDC LEAD SERIAL: 313758
MDC IDC MSMT LEADCHNL RV PACING THRESHOLD PULSEWIDTH: 0.5 ms
MDC IDC MSMT LEADCHNL RV SENSING INTR AMPL: 25 mV — AB
MDC IDC SESS DTM: 20170314040000
Pulse Gen Serial Number: 268731

## 2015-10-23 NOTE — Progress Notes (Signed)
HPI [name] returns today for followup. He is a pleasant 70 yo man with ESRD on HD, chronic systolic heart failure and s/p ICD implant. He continues to lose weight by watching his diet. He denies chest pain, sob, or peripheral edema. He does have problems with dizziness and orthostasis. No ICD shock. No Known Allergies   Current Outpatient Prescriptions  Medication Sig Dispense Refill  . aspirin EC 81 MG tablet Take 81 mg by mouth daily.    . carvedilol (COREG) 3.125 MG tablet Take 1 tablet (3.125 mg total) by mouth 2 (two) times daily. 180 tablet 3   No current facility-administered medications for this visit.     Past Medical History  Diagnosis Date  . HYPERTENSION   . HYPERCHOLESTEROLEMIA, MIXED   . CHF   . DIAB W/O COMP TYPE II/UNS NOT STATED UNCNTRL   . 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 Anemia   . Acute on chronic renal failure ([place])     [name] 03/11/2013 (03/11/2013)  . Heart murmur   . Automatic implantable cardioverter-defibrillator in situ     [employer]  . Pacemaker   . Shortness of breath   . GERD (gastroesophageal reflux disease)   . 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 Peripheral arterial disease (HCC)     left fifth toe ulcer, healing    ROS:   All systems reviewed and negative except as noted in the HPI.   Past Surgical History  Procedure Laterality Date  . Incision and drainage abscess / hematoma of bursa / knee / thigh Left 2012    "knee" (03/11/2013)  . Av fistula placement Right 12/13/2012    Procedure: ARTERIOVENOUS (AV) FISTULA CREATION;  Surgeon: [name], MD;  Location: [place];  Service: Vascular;  Laterality: Right;  Ultrasound guided  . Cardiac defibrillator placement      [employer]  . Cardiac catheterization    . Insert / replace / remove pacemaker    . Shuntogram N/A 05/31/2013    Procedure: Earney Mallet;  Surgeon: [name], MD;  [facility]: [facility] CATH LAB;   Service: Cardiovascular;  Laterality: N/A;     Family History  Problem Relation Age of Onset  . Heart disease Father   . CAD Father      Social History   Social History  . Marital Status: Married    Spouse Name: [name]  . Number of Children: 2  . Years of Education: 13   Occupational History  . Not on file.   Social History Main Topics  . Smoking status: Former Smoker -- 0.75 packs/day for 30 years    Types: Cigarettes    Quit date: 11/25/1992  . Smokeless tobacco: Never Used  . Alcohol Use: No     Comment: have not had a drink 2012  . Drug Use: No  . Sexual Activity: Yes   Other Topics Concern  . Not on file   Social History Narrative   Patient is married [name] and lives with his wife and child   Patient has two children.   Patient is retired.   Patient has a college education.   Patient is right-handed.   Patient drinks very little caffeine.           BP 130/80 mmHg  Pulse 84  Ht 5\' 8"  (1.727 m)  Wt 191 lb 12.8 oz (87 kg)  BMI 29.17 kg/m2  Physical Exam:  Well appearing 70 yo man, NAD HEENT: Unremarkable Neck:  6 cm JVD, no thyromegally Lymphatics:  No adenopathy Back:  No CVA tenderness Lungs:  Clear with no wheezes HEART:  Regular rate rhythm, no murmurs, no rubs, no clicks Abd:  soft, positive bowel sounds, no organomegally, no rebound, no guarding Ext:  2 plus pulses, no edema, no cyanosis, no clubbing Skin:  No rashes no nodules Neuro:  CN II through XII intact, motor grossly intact  ECG - NSR with IVCD  DEVICE  Normal device function.  See PaceArt for details.   Assess/Plan: 1. Chronic systolic heart failure - his symptoms are class 2. He will continue his current meds. He is encouraged to lose weight and to maintain a low sodium diet. 2. CAD - he denies anginal symptoms. No change in meds. I encouraged him to increase his physical activity 3. ICD - his [employer] device is working normally. Will recheck in several  months.  Mikle Bosworth.D.

## 2015-10-23 NOTE — Patient Instructions (Signed)
Medication Instructions:  Your physician recommends that you continue on your current medications as directed. Please refer to the Current Medication list given to you today.   Labwork: None ordered   Testing/Procedures: None ordered   Follow-Up: Your physician wants you to follow-up in: 12 months with Dr Taylor You will receive a reminder letter in the mail two months in advance. If you don't receive a letter, please call our office to schedule the follow-up appointment.   Remote monitoring is used to monitor your  ICD from home. This monitoring reduces the number of office visits required to check your device to one time per year. It allows us to keep an eye on the functioning of your device to ensure it is working properly. You are scheduled for a device check from home on 01/22/16. You may send your transmission at any time that day. If you have a wireless device, the transmission will be sent automatically. After your physician reviews your transmission, you will receive a postcard with your next transmission date.    Any Other Special Instructions Will Be Listed Below (If Applicable).     If you need a refill on your cardiac medications before your next appointment, please call your pharmacy.   

## 2015-10-25 ENCOUNTER — Ambulatory Visit: Payer: Medicare Other | Admitting: Vascular Surgery

## 2015-10-29 ENCOUNTER — Encounter (HOSPITAL_COMMUNITY): Payer: Self-pay | Admitting: *Deleted

## 2015-10-29 MED ORDER — CEFUROXIME SODIUM 1.5 G IJ SOLR
1.5000 g | INTRAMUSCULAR | Status: AC
  Administered 2015-10-30: 1.5 g via INTRAVENOUS
  Filled 2015-10-29: qty 1.5

## 2015-10-29 MED ORDER — SODIUM CHLORIDE 0.9 % IV SOLN
INTRAVENOUS | Status: DC
  Administered 2015-10-30 (×2): via INTRAVENOUS

## 2015-10-29 NOTE — Progress Notes (Signed)
Pt has an ICD and is followed by Dr. Cristopher Peru. Last office visit was 10/23/15. Pt is diabetic, but does not take medication.   Echo - 03/13/13 - in [place] - 07/12/13 - in [facility] EKG - 10/23/15 - in [facility]

## 2015-10-30 ENCOUNTER — Ambulatory Visit (HOSPITAL_COMMUNITY): Payer: Medicare Other | Admitting: Certified Registered"

## 2015-10-30 ENCOUNTER — Encounter (HOSPITAL_COMMUNITY)
Admission: RE | Disposition: A | Discharge status: Home / Self Care | Payer: Self-pay | Source: Ambulatory Visit | Attending: Vascular Surgery

## 2015-10-30 ENCOUNTER — Ambulatory Visit (HOSPITAL_COMMUNITY)
Admission: RE | Admit: 2015-10-30 | Discharge: 2015-10-30 | Disposition: A | Discharge status: Home / Self Care | Payer: Medicare Other | Source: Ambulatory Visit | Attending: Vascular Surgery | Admitting: Vascular Surgery

## 2015-10-30 DIAGNOSIS — E7800 Pure hypercholesterolemia, unspecified: Secondary | ICD-10-CM | POA: Insufficient documentation

## 2015-10-30 DIAGNOSIS — E1122 Type 2 diabetes mellitus with diabetic chronic kidney disease: Secondary | ICD-10-CM | POA: Diagnosis not present

## 2015-10-30 DIAGNOSIS — T82898A Other specified complication of vascular prosthetic devices, implants and grafts, initial encounter: Secondary | ICD-10-CM

## 2015-10-30 DIAGNOSIS — I130 Hypertensive heart and chronic kidney disease with heart failure and stage 1 through stage 4 chronic kidney disease, or unspecified chronic kidney disease: Secondary | ICD-10-CM | POA: Diagnosis not present

## 2015-10-30 DIAGNOSIS — Y832 Surgical operation with anastomosis, bypass or graft as the cause of abnormal reaction of the patient, or of later complication, without mention of misadventure at the time of the procedure: Secondary | ICD-10-CM | POA: Diagnosis not present

## 2015-10-30 DIAGNOSIS — Z7982 Long term (current) use of aspirin: Secondary | ICD-10-CM | POA: Diagnosis not present

## 2015-10-30 DIAGNOSIS — I509 Heart failure, unspecified: Secondary | ICD-10-CM | POA: Diagnosis not present

## 2015-10-30 DIAGNOSIS — Z992 Dependence on renal dialysis: Secondary | ICD-10-CM | POA: Insufficient documentation

## 2015-10-30 DIAGNOSIS — Z95810 Presence of automatic (implantable) cardiac defibrillator: Secondary | ICD-10-CM | POA: Insufficient documentation

## 2015-10-30 DIAGNOSIS — N186 End stage renal disease: Secondary | ICD-10-CM | POA: Insufficient documentation

## 2015-10-30 DIAGNOSIS — E1151 Type 2 diabetes mellitus with diabetic peripheral angiopathy without gangrene: Secondary | ICD-10-CM | POA: Diagnosis not present

## 2015-10-30 DIAGNOSIS — Z79899 Other long term (current) drug therapy: Secondary | ICD-10-CM | POA: Insufficient documentation

## 2015-10-30 DIAGNOSIS — K219 Gastro-esophageal reflux disease without esophagitis: Secondary | ICD-10-CM | POA: Diagnosis not present

## 2015-10-30 DIAGNOSIS — Z87891 Personal history of nicotine dependence: Secondary | ICD-10-CM | POA: Diagnosis not present

## 2015-10-30 DIAGNOSIS — I429 Cardiomyopathy, unspecified: Secondary | ICD-10-CM | POA: Diagnosis not present

## 2015-10-30 DIAGNOSIS — Z7984 Long term (current) use of oral hypoglycemic drugs: Secondary | ICD-10-CM | POA: Insufficient documentation

## 2015-10-30 HISTORY — DX: Unspecified macular degeneration: H35.30

## 2015-10-30 HISTORY — PX: REVISON OF ARTERIOVENOUS FISTULA: SHX6074

## 2015-10-30 HISTORY — DX: Pneumonia, unspecified organism: J18.9

## 2015-10-30 HISTORY — DX: Unspecified osteoarthritis, unspecified site: M19.90

## 2015-10-30 LAB — GLUCOSE, CAPILLARY
GLUCOSE-CAPILLARY: 95 mg/dL (ref 65–99)
Glucose-Capillary: 99 mg/dL (ref 65–99)

## 2015-10-30 LAB — POCT I-STAT 4, (NA,K, GLUC, HGB,HCT)
Glucose, Bld: 85 mg/dL (ref 65–99)
HEMATOCRIT: 46 % (ref 39.0–52.0)
Hemoglobin: 15.6 g/dL (ref 13.0–17.0)
Potassium: 3.9 mmol/L (ref 3.5–5.1)
Sodium: 139 mmol/L (ref 135–145)

## 2015-10-30 SURGERY — REVISON OF ARTERIOVENOUS FISTULA
Anesthesia: Monitor Anesthesia Care | Site: Arm Upper | Laterality: Right

## 2015-10-30 MED ORDER — 0.9 % SODIUM CHLORIDE (POUR BTL) OPTIME
TOPICAL | Status: DC | PRN
  Administered 2015-10-30: 1000 mL

## 2015-10-30 MED ORDER — LIDOCAINE HCL (PF) 1 % IJ SOLN
INTRAMUSCULAR | Status: AC
  Filled 2015-10-30: qty 30

## 2015-10-30 MED ORDER — CHLORHEXIDINE GLUCONATE CLOTH 2 % EX PADS: 6.0000 | MEDICATED_PAD | Freq: Once | CUTANEOUS | Status: DC

## 2015-10-30 MED ORDER — LIDOCAINE HCL (PF) 1 % IJ SOLN
INTRAMUSCULAR | Status: DC | PRN
  Administered 2015-10-30: 30 mL

## 2015-10-30 MED ORDER — DEXMEDETOMIDINE HCL 200 MCG/2ML IV SOLN
INTRAVENOUS | Status: DC | PRN
  Administered 2015-10-30: 45 ug via INTRAVENOUS

## 2015-10-30 MED ORDER — MIDAZOLAM HCL 2 MG/2ML IJ SOLN
INTRAMUSCULAR | Status: AC
  Filled 2015-10-30: qty 2

## 2015-10-30 MED ORDER — DEXMEDETOMIDINE HCL IN NACL 200 MCG/50ML IV SOLN
INTRAVENOUS | Status: DC | PRN
  Administered 2015-10-30: .6 ug/kg/h via INTRAVENOUS

## 2015-10-30 MED ORDER — MEPERIDINE HCL 25 MG/ML IJ SOLN: 6.2500 mg | INTRAMUSCULAR | Status: DC | PRN

## 2015-10-30 MED ORDER — PROMETHAZINE HCL 25 MG/ML IJ SOLN: 6.2500 mg | INTRAMUSCULAR | Status: DC | PRN

## 2015-10-30 MED ORDER — HEPARIN SODIUM (PORCINE) 1000 UNIT/ML IJ SOLN
INTRAMUSCULAR | Status: DC | PRN
  Administered 2015-10-30: 5000 [IU] via INTRAVENOUS

## 2015-10-30 MED ORDER — SODIUM CHLORIDE 0.9 % IV SOLN
INTRAVENOUS | Status: DC | PRN
  Administered 2015-10-30: 500 mL

## 2015-10-30 MED ORDER — MIDAZOLAM HCL 2 MG/2ML IJ SOLN: 0.5000 mg | Freq: Once | INTRAMUSCULAR | Status: DC | PRN

## 2015-10-30 MED ORDER — LIDOCAINE HCL (CARDIAC) 20 MG/ML IV SOLN
INTRAVENOUS | Status: AC
  Filled 2015-10-30: qty 5

## 2015-10-30 MED ORDER — FENTANYL CITRATE (PF) 250 MCG/5ML IJ SOLN
INTRAMUSCULAR | Status: DC | PRN
  Administered 2015-10-30: 50 ug via INTRAVENOUS

## 2015-10-30 MED ORDER — MIDAZOLAM HCL 5 MG/5ML IJ SOLN
INTRAMUSCULAR | Status: DC | PRN
  Administered 2015-10-30 (×2): 1 mg via INTRAVENOUS

## 2015-10-30 MED ORDER — OXYCODONE HCL 5 MG PO TABS: 5.0000 mg | ORAL_TABLET | Freq: Four times a day (QID) | ORAL | Status: DC | PRN

## 2015-10-30 MED ORDER — FENTANYL CITRATE (PF) 100 MCG/2ML IJ SOLN: 25.0000 ug | INTRAMUSCULAR | Status: DC | PRN

## 2015-10-30 MED ORDER — HEPARIN SODIUM (PORCINE) 1000 UNIT/ML IJ SOLN
INTRAMUSCULAR | Status: AC
  Filled 2015-10-30: qty 1

## 2015-10-30 MED ORDER — SODIUM CHLORIDE 0.9 % IV SOLN: INTRAVENOUS | Status: DC

## 2015-10-30 MED ORDER — PROTAMINE SULFATE 10 MG/ML IV SOLN
INTRAVENOUS | Status: AC
  Filled 2015-10-30: qty 5

## 2015-10-30 MED ORDER — FENTANYL CITRATE (PF) 250 MCG/5ML IJ SOLN
INTRAMUSCULAR | Status: AC
  Filled 2015-10-30: qty 5

## 2015-10-30 SURGICAL SUPPLY — 34 items
CANISTER SUCTION 2500CC (MISCELLANEOUS) ×2 IMPLANT
CANNULA VESSEL 3MM 2 BLNT TIP (CANNULA) IMPLANT
CLIP TI MEDIUM 6 (CLIP) ×2 IMPLANT
CLIP TI WIDE RED SMALL 6 (CLIP) ×2 IMPLANT
COVER PROBE W GEL 5X96 (DRAPES) ×2 IMPLANT
DECANTER SPIKE VIAL GLASS SM (MISCELLANEOUS) ×2 IMPLANT
DRAIN PENROSE 1/4X12 LTX STRL (WOUND CARE) ×2 IMPLANT
ELECT REM PT RETURN 9FT ADLT (ELECTROSURGICAL) ×2
ELECTRODE REM PT RTRN 9FT ADLT (ELECTROSURGICAL) ×1 IMPLANT
GEL ULTRASOUND 20GR AQUASONIC (MISCELLANEOUS) IMPLANT
GLOVE BIO SURGEON STRL SZ 6.5 (GLOVE) ×2 IMPLANT
GLOVE BIO SURGEON STRL SZ7.5 (GLOVE) ×2 IMPLANT
GLOVE BIOGEL PI IND STRL 6.5 (GLOVE) IMPLANT
GLOVE BIOGEL PI INDICATOR 6.5 (GLOVE) ×2
GLOVE SKINSENSE NS SZ7.0 (GLOVE) ×1
GLOVE SKINSENSE STRL SZ7.0 (GLOVE) IMPLANT
GOWN BRE IMP SLV AUR XL STRL (GOWN DISPOSABLE) ×1 IMPLANT
GOWN STRL REUS W/ TWL LRG LVL3 (GOWN DISPOSABLE) ×3 IMPLANT
GOWN STRL REUS W/TWL LRG LVL3 (GOWN DISPOSABLE) ×6
KIT BASIN OR (CUSTOM PROCEDURE TRAY) ×2 IMPLANT
KIT ROOM TURNOVER OR (KITS) ×2 IMPLANT
LIQUID BAND (GAUZE/BANDAGES/DRESSINGS) ×2 IMPLANT
LOOP VESSEL MINI RED (MISCELLANEOUS) IMPLANT
NS IRRIG 1000ML POUR BTL (IV SOLUTION) ×2 IMPLANT
PACK CV ACCESS (CUSTOM PROCEDURE TRAY) ×2 IMPLANT
PAD ARMBOARD 7.5X6 YLW CONV (MISCELLANEOUS) ×4 IMPLANT
SPONGE SURGIFOAM ABS GEL 100 (HEMOSTASIS) IMPLANT
SUT PROLENE 5 0 C 1 24 (SUTURE) ×1 IMPLANT
SUT PROLENE 7 0 BV 1 (SUTURE) ×2 IMPLANT
SUT VIC AB 3-0 SH 27 (SUTURE) ×2
SUT VIC AB 3-0 SH 27X BRD (SUTURE) ×1 IMPLANT
SUT VICRYL 4-0 PS2 18IN ABS (SUTURE) ×2 IMPLANT
UNDERPAD 30X30 INCONTINENT (UNDERPADS AND DIAPERS) ×2 IMPLANT
WATER STERILE IRR 1000ML POUR (IV SOLUTION) ×2 IMPLANT

## 2015-10-30 NOTE — Interval H&P Note (Signed)
History and Physical Interval Note:  10/30/2015 1:56 PM  Timothy Faulkner  has presented today for surgery, with the diagnosis of End Stage Renal Disease N18.6  The various methods of treatment have been discussed with the patient and family. After consideration of risks, benefits and other options for treatment, the patient has consented to  Procedure(s): Plication OF Right Arm ARTERIOVENOUS FISTULA (Right) as a surgical intervention .  The patient's history has been reviewed, patient examined, no change in status, stable for surgery.  I have reviewed the patient's chart and labs.  Questions were answered to the patient's satisfaction.     Ruta Hinds

## 2015-10-30 NOTE — Op Note (Addendum)
Procedure: Plication of right upper extremity AV fistula  Preoperative diagnosis: Aneurysmal degeneration right arm AV fistula   Postoperative diagnosis: Same  Anesthesia: Local with sedation  Assistant: Leontine Locket PA-C  Operative findings: Plication of proximal third of right upper extremity AV fistula          Operative details: After obtaining informed consent, the patient was taken to the operating room. The patient was placed in supine position on the operating room table. After adequate sedation, the right upper extremity was prepped and draped in usual sterile fashion. A longitudinal incision was made over the central third of the fistula incorporating an area of aneurysmal degeneration. The fistula was dissected free circumferentially throughout its course. The patient was given 5000 units of intravenous heparin. The fistula was clamped proximally and distally above and below the aneurysm. The aneurysm was opened longitudinally and the redundant segment fistula excised. The area of resection was then reapproximated using a running 5-0 Prolene suture. The clamps were removed and flow restored to the fistula. Hemostasis was obtained. Some of the redundant skin was then resected. The subcutaneous tissues were reapproximated using a running 3-0 Vicryl suture. The skin was closed with a 4 0 Vicryl subcuticular stitch. Dermabond was applied to the skin incision.  The patient tolerated procedure well and there were no complications. Instrument sponge and needle counts were correct at the end of the case. The patient was taken to the recovery room in stable condition.   Ruta Hinds, MD [facility] [place] Office: [phone] Pager: [phone]

## 2015-10-30 NOTE — H&P (View-Only) (Signed)
Patient is a 70 year old male referred by Dr. 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   The patient denies any prior bleeding episodes. He is scheduled for a fistulogram possible intervention due to outflow stenosis on next Tuesday at [facility]. Fistula was placed in may of 2014. Chronic medical problems include elevated cholesterol, hypertension, congestive failure, diabetes all of which are currently stable. He also has an AICD.  Past Medical History  Diagnosis Date  . HYPERTENSION   . HYPERCHOLESTEROLEMIA, MIXED   . CHF   . DIAB W/O COMP TYPE II/UNS NOT STATED UNCNTRL   . 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 Anemia   . Acute on chronic renal failure ([place])     [name] 03/11/2013 (03/11/2013)  . Heart murmur   . Automatic implantable cardioverter-defibrillator in situ     [employer]  . Pacemaker   . Shortness of breath   . GERD (gastroesophageal reflux disease)   . 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 Peripheral arterial disease (HCC)     left fifth toe ulcer, healing   Past Surgical History  Procedure Laterality Date  . Incision and drainage abscess / hematoma of bursa / knee / thigh Left 2012    "knee" (03/11/2013)  . Av fistula placement Right 12/13/2012    Procedure: ARTERIOVENOUS (AV) FISTULA CREATION;  Surgeon: [name], MD;  Location: [facility];  Service: Vascular;  Laterality: Right;  Ultrasound guided  . Cardiac defibrillator placement      [employer]  . Cardiac catheterization    . Insert / replace / remove pacemaker    . Shuntogram N/A 05/31/2013    Procedure: Earney Mallet;  Surgeon: [name], MD;  Location: [facility] CATH LAB;  Service: Cardiovascular;  Laterality: N/A;      Review of systems: He denies shortness of breath. He denies chest pain.  Current Outpatient Prescriptions on File Prior to Visit  Medication Sig Dispense Refill  . aspirin 81 MG EC tablet Take  1 tablet (81 mg total) by mouth daily. On hold for 10 days (Patient taking differently: Take 81 mg by mouth daily. )    . carvedilol (COREG) 3.125 MG tablet Take 1 tablet (3.125 mg total) by mouth 2 (two) times daily. 180 tablet 3  . Cholecalciferol (VITAMIN D PO) Take 2 tablets by mouth 3 (three) times a week. Reported on 10/18/2015    . digoxin (LANOXIN) 0.125 MG tablet Take 1 tablet (0.125 mg total) by mouth daily. (Patient not taking: Reported on 10/18/2015) 90 tablet 3  . ezetimibe-simvastatin (VYTORIN) 10-40 MG per tablet Take 1 tablet by mouth at bedtime. (Patient not taking: Reported on 10/18/2015) 90 tablet 3  . isosorbide-hydrALAZINE (BIDIL) 20-37.5 MG per tablet Take 1 tablet by mouth daily. (Patient not taking: Reported on 10/18/2015)     No current facility-administered medications on file prior to visit.    Physical exam:  Filed Vitals:   10/18/15 1239 10/18/15 1242  BP: 165/103 144/86  Pulse: 75   Temp: 97.8 F (36.6 C)   TempSrc: Oral   Resp: 18   Height: 5\' 8"  (1.727 m)   Weight: 196 lb 1.6 oz (88.95 kg)   SpO2: 99%      Chest: Clear to auscultation bilaterally. Cardiac: Regular rate and rhythm  Extremities: Right upper extremity pulsatile right upper arm AV fistula with 2 areas of aneurysmal degeneration 3  cm diameter the more distal aneurysm near the antecubital fossa has an area of whitish thinned out skin with a small area of erosion   Assessment: fistula with erosion will need revision and repair of this. The patient is scheduled for a shuntogram and possible intervention next week. 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 The patient will be scheduled for revision of his fistula following Tuesday March 21   Plan: Revision right arm AV fistula March 21 area risk benefits possible claudication and procedure details were discussed patient today including but limited to bleeding infection most of fistula. He understands  agrees to proceed  Ruta Hinds, MD [facility] [place] Office: [phone] Pager: [phone]

## 2015-10-30 NOTE — Discharge Instructions (Signed)
° ° °  10/30/2015 Timothy Faulkner [id number] 12-20-1945  Surgeon(s): Elam Dutch, MD  Procedure(s): Plication OF Right Arm ARTERIOVENOUS FISTULA  x May stick graft on designated area only:  Do NOT stick over incision for 8 weeks. May stick above and below incision now. SEE DIAGRAM.

## 2015-10-30 NOTE — Transfer of Care (Signed)
Immediate Anesthesia Transfer of Care Note  Patient: Timothy Faulkner  Procedure(s) Performed: Procedure(s): Plication OF Right Arm ARTERIOVENOUS FISTULA (Right)  Patient Location: PACU  Anesthesia Type:MAC  Level of Consciousness: awake, alert , oriented and patient cooperative  Airway & Oxygen Therapy: Patient Spontanous Breathing  Post-op Assessment: Report given to RN, Post -op Vital signs reviewed and stable and Patient moving all extremities  Post vital signs: Reviewed and stable  Last Vitals:  Filed Vitals:   10/30/15 1038  BP: 139/74  Pulse: 73  Temp: 36.8 C  Resp: 18    Complications: No apparent anesthesia complications

## 2015-10-30 NOTE — Anesthesia Postprocedure Evaluation (Signed)
Anesthesia Post Note  Patient: Timothy Faulkner  Procedure(s) Performed: Procedure(s) (LRB): Plication OF Right Arm ARTERIOVENOUS FISTULA (Right)  Patient location during evaluation: PACU Anesthesia Type: MAC Level of consciousness: awake and alert, oriented and patient cooperative Pain management: pain level controlled Vital Signs Assessment: post-procedure vital signs reviewed and stable Respiratory status: spontaneous breathing, nonlabored ventilation and respiratory function stable Cardiovascular status: blood pressure returned to baseline and stable Postop Assessment: no signs of nausea or vomiting and adequate PO intake Anesthetic complications: no    Last Vitals:  Filed Vitals:   10/30/15 1550 10/30/15 1555  BP: 96/69   Pulse: 60   Temp:  36.6 C  Resp: 18     Last Pain:  Filed Vitals:   10/30/15 1616  PainSc: 0-No pain                 Stratton Villwock,E. Nygel Prokop

## 2015-10-30 NOTE — Anesthesia Preprocedure Evaluation (Addendum)
Anesthesia Evaluation  Patient identified by MRN, date of birth, ID band Patient awake    Reviewed: Allergy & Precautions, NPO status , Patient's Chart, lab work & pertinent test results, reviewed documented beta blocker date and time   History of Anesthesia Complications Negative for: history of anesthetic complications  Airway Mallampati: I  TM Distance: >3 FB Neck ROM: Full    Dental  (+) Edentulous Upper, Poor Dentition, Loose, Dental Advisory Given, Missing   Pulmonary shortness of breath, sleep apnea (no CPAP) , former smoker,    breath sounds clear to auscultation       Cardiovascular hypertension, Pt. on medications and Pt. on home beta blockers + Peripheral Vascular Disease  + Cardiac Defibrillator  Rhythm:Regular Rate:Normal  '14 stress: LV dilated. No fixed or reversible defects to suggest infarction or ischemia, global hypokinesis, EF 28%.   Neuro/Psych negative neurological ROS     GI/Hepatic Neg liver ROS, GERD  Controlled,  Endo/Other  diabetes (glu 95), Oral Hypoglycemic Agents  Renal/GU Dialysis and ESRFRenal disease (K+ 3.9)     Musculoskeletal   Abdominal (+) + obese,   Peds  Hematology negative hematology ROS (+)   Anesthesia Other Findings   Reproductive/Obstetrics                           Anesthesia Physical Anesthesia Plan  ASA: III  Anesthesia Plan: MAC   Post-op Pain Management:    Induction:   Airway Management Planned:   Additional Equipment:   Intra-op Plan:   Post-operative Plan:   Informed Consent: I have reviewed the patients History and Physical, chart, labs and discussed the procedure including the risks, benefits and alternatives for the proposed anesthesia with the patient or authorized representative who has indicated his/her understanding and acceptance.   Dental advisory given  Plan Discussed with: CRNA and Surgeon  Anesthesia Plan  Comments: (Plan routine monitors, MAC)        Anesthesia Quick Evaluation

## 2015-10-31 ENCOUNTER — Encounter (HOSPITAL_COMMUNITY): Payer: Self-pay | Admitting: Vascular Surgery

## 2015-10-31 ENCOUNTER — Telehealth: Payer: Self-pay | Admitting: Vascular Surgery

## 2015-10-31 NOTE — Telephone Encounter (Signed)
LM for pt re appt, dpm °

## 2015-10-31 NOTE — Telephone Encounter (Signed)
-----   Message from Mena Goes, RN sent at 10/30/2015  2:37 PM EDT ----- Regarding: schedule   ----- Message -----    From: Gabriel Earing, PA-C    Sent: 10/30/2015   1:55 PM      To: Vvs Charge Pool  S/p RUA AVF revision 10/30/15.  F/u with [facility] in 2 weeks.  Thanks, Aldona Bar

## 2015-11-06 ENCOUNTER — Encounter: Payer: Self-pay | Admitting: Vascular Surgery

## 2015-11-14 ENCOUNTER — Encounter: Payer: Self-pay | Admitting: Vascular Surgery

## 2015-11-14 ENCOUNTER — Ambulatory Visit (INDEPENDENT_AMBULATORY_CARE_PROVIDER_SITE_OTHER): Payer: Medicare Other | Admitting: Vascular Surgery

## 2015-11-14 VITALS — BP 100/62 | HR 90 | Temp 97.6°F | Resp 16 | Ht 68.0 in | Wt 190.0 lb

## 2015-11-14 DIAGNOSIS — Z992 Dependence on renal dialysis: Secondary | ICD-10-CM

## 2015-11-14 DIAGNOSIS — N186 End stage renal disease: Secondary | ICD-10-CM

## 2015-11-14 NOTE — Progress Notes (Signed)
Patient is a 70 year old male status post revision of his right upper arm AV fistula for aneurysmal degeneration. On exam today his incision is healing well there is no drainage. They're currently using the fistula above and below the area of repair without difficulty. The patient will follow-up with [place] on an as-needed basis.  Filed Vitals:   11/14/15 1326  BP: 100/62  Pulse: 90  Temp: 97.6 F (36.4 C)  TempSrc: Oral  Resp: 16  Height: 5\' 8"  (1.727 m)  Weight: 190 lb (86.183 kg)  SpO2: 98%     Ruta Hinds, MD [facility] [place] Office: [phone] Pager: [phone]

## 2015-12-01 ENCOUNTER — Emergency Department (HOSPITAL_COMMUNITY)
Admission: EM | Admit: 2015-12-01 | Discharge: 2015-12-01 | Disposition: A | Discharge status: Home / Self Care | Payer: Medicare Other | Attending: Emergency Medicine | Admitting: Emergency Medicine

## 2015-12-01 ENCOUNTER — Encounter (HOSPITAL_COMMUNITY): Payer: Self-pay | Admitting: Family Medicine

## 2015-12-01 DIAGNOSIS — Z79899 Other long term (current) drug therapy: Secondary | ICD-10-CM | POA: Diagnosis not present

## 2015-12-01 DIAGNOSIS — L97529 Non-pressure chronic ulcer of other part of left foot with unspecified severity: Secondary | ICD-10-CM | POA: Insufficient documentation

## 2015-12-01 DIAGNOSIS — Z862 Personal history of diseases of the blood and blood-forming organs and certain disorders involving the immune mechanism: Secondary | ICD-10-CM | POA: Diagnosis not present

## 2015-12-01 DIAGNOSIS — Z87448 Personal history of other diseases of urinary system: Secondary | ICD-10-CM | POA: Diagnosis not present

## 2015-12-01 DIAGNOSIS — M1990 Unspecified osteoarthritis, unspecified site: Secondary | ICD-10-CM | POA: Diagnosis not present

## 2015-12-01 DIAGNOSIS — T829XXA Unspecified complication of cardiac and vascular prosthetic device, implant and graft, initial encounter: Secondary | ICD-10-CM

## 2015-12-01 DIAGNOSIS — I509 Heart failure, unspecified: Secondary | ICD-10-CM | POA: Diagnosis not present

## 2015-12-01 DIAGNOSIS — Z992 Dependence on renal dialysis: Secondary | ICD-10-CM | POA: Diagnosis not present

## 2015-12-01 DIAGNOSIS — I12 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 chronic kidney disease or end stage renal disease: Secondary | ICD-10-CM | POA: Insufficient documentation

## 2015-12-01 DIAGNOSIS — Z9889 Other specified postprocedural states: Secondary | ICD-10-CM | POA: Insufficient documentation

## 2015-12-01 DIAGNOSIS — N186 End stage renal disease: Secondary | ICD-10-CM | POA: Diagnosis not present

## 2015-12-01 DIAGNOSIS — Z8639 Personal history of other endocrine, nutritional and metabolic disease: Secondary | ICD-10-CM | POA: Diagnosis not present

## 2015-12-01 DIAGNOSIS — Y658 Other specified misadventures during surgical and medical care: Secondary | ICD-10-CM | POA: Insufficient documentation

## 2015-12-01 DIAGNOSIS — Z87891 Personal history of nicotine dependence: Secondary | ICD-10-CM | POA: Diagnosis not present

## 2015-12-01 DIAGNOSIS — Z8701 Personal history of pneumonia (recurrent): Secondary | ICD-10-CM | POA: Insufficient documentation

## 2015-12-01 DIAGNOSIS — E11621 Type 2 diabetes mellitus with foot ulcer: Secondary | ICD-10-CM | POA: Diagnosis not present

## 2015-12-01 DIAGNOSIS — Z7982 Long term (current) use of aspirin: Secondary | ICD-10-CM | POA: Insufficient documentation

## 2015-12-01 DIAGNOSIS — R011 Cardiac murmur, unspecified: Secondary | ICD-10-CM | POA: Diagnosis not present

## 2015-12-01 LAB — BASIC METABOLIC PANEL
ANION GAP: 16 — AB (ref 5–15)
BUN: 46 mg/dL — ABNORMAL HIGH (ref 6–20)
CALCIUM: 8.3 mg/dL — AB (ref 8.9–10.3)
CO2: 22 mmol/L (ref 22–32)
Chloride: 99 mmol/L — ABNORMAL LOW (ref 101–111)
Creatinine, Ser: 11.21 mg/dL — ABNORMAL HIGH (ref 0.61–1.24)
GFR, EST AFRICAN AMERICAN: 5 mL/min — AB (ref >60)
GFR, EST NON AFRICAN AMERICAN: 4 mL/min — AB (ref >60)
Glucose, Bld: 107 mg/dL — ABNORMAL HIGH (ref 65–99)
Potassium: 4.3 mmol/L (ref 3.5–5.1)
Sodium: 137 mmol/L (ref 135–145)

## 2015-12-01 NOTE — Discharge Instructions (Signed)
Return to the Emergency Department if you develop shortness of breath, chest pain, or increasing lower extremity edema

## 2015-12-01 NOTE — ED Notes (Signed)
Pt here with dialysis graft problems. sts clotting and unabe to get dialysis yesterday. sts last one was Wednesday,

## 2015-12-01 NOTE — ED Provider Notes (Signed)
CSN: [id number]     Arrival date & time 12/01/15  [id number] History   First MD Initiated Contact with Patient 12/01/15 [phone]     Chief Complaint  Patient presents with  . 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  He reports initially having a problem with his AV fistula on Wednesday, which he states was corrected.  He then had dialysis Thursday, 2 days ago.  He states that when he went for dialysis Friday he was told that it was infiltrated.  He then went and saw [facility] yesterday and had a stent placed and was discharged home.   He was told to follow up for dialysis today.  When he went to dialysis he was told that his fistula was clotted.  Therefore, he was unable to have dialysis today.  He states that the staff at the dialysis center called [facility] and was told to have the patient come to the ED to have Potassium checked.  Patient denies any symptoms at this time.  He denies SOB, chest pain, LE edema, or any other symptoms at this time.    The history is provided by the patient.    Past Medical History  Diagnosis Date  . HYPERTENSION   . HYPERCHOLESTEROLEMIA, MIXED   . CHF   . DIAB W/O COMP TYPE II/UNS NOT STATED UNCNTRL   . 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 Anemia   . Heart murmur   . Automatic implantable cardioverter-defibrillator in situ     [employer]  . Pacemaker   . Shortness of breath   . GERD (gastroesophageal reflux disease)   . 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 Peripheral arterial disease (HCC)     left fifth toe ulcer, healing  . Pneumonia   . Acute on chronic renal failure ([place])     [name] 03/11/2013 (03/11/2013) dialysis M/W/F  . Arthritis     Gout  . Macular  degeneration    Past Surgical History  Procedure Laterality Date  . Incision and drainage abscess / hematoma of bursa / knee / thigh Left 2012    "knee" (03/11/2013)  . Av fistula placement Right 12/13/2012    Procedure: ARTERIOVENOUS (AV) FISTULA CREATION;  Surgeon: [name], MD;  Location: [facility];  Service: Vascular;  Laterality: Right;  Ultrasound guided  . Cardiac defibrillator placement      [employer]  . Cardiac catheterization    . Insert / replace / remove pacemaker    . Shuntogram N/A 05/31/2013    Procedure: Earney Mallet;  Surgeon: [name], MD;  Location: [facility] CATH LAB;  Service: Cardiovascular;  Laterality: N/A;  . Revison of arteriovenous fistula Right [id number]    Procedure: Plication OF Right Arm ARTERIOVENOUS FISTULA;  Surgeon: Elam Dutch, MD;  Location: [facility] OR;  Service: Vascular;  Laterality: Right;   Family History  Problem Relation Age of Onset  . Heart disease Father   . CAD Father    Social History  Substance Use Topics  . Smoking status: Former Smoker -- 0.75 packs/day for 30 years    Types: Cigarettes    Quit date: 11/25/1992  . Smokeless tobacco: Never Used  . Alcohol Use: No     Comment: have not had  a drink 2012    Review of Systems  All other systems reviewed and are negative.     Allergies  Review of patient's allergies indicates no known allergies.  Home Medications   Prior to Admission medications   Medication Sig Start Date End Date Taking? Authorizing Provider  aspirin EC 81 MG tablet Take 81 mg by mouth daily.    Historical Provider, MD  carvedilol (COREG) 3.125 MG tablet Take 1 tablet (3.125 mg total) by mouth 2 (two) times daily. 08/22/14   Evans Lance, MD   BP 130/77 mmHg  Pulse 92  Temp(Src) 98.3 F (36.8 C) (Oral)  Resp 16  SpO2 98% Physical Exam  Constitutional: He appears well-developed and well-nourished.  HENT:  Head: Normocephalic and atraumatic.  Mouth/Throat: Oropharynx is clear and moist.  Neck:  Normal range of motion. Neck supple.  Cardiovascular: Normal rate, regular rhythm and normal heart sounds.   Pulmonary/Chest: Effort normal and breath sounds normal.  Musculoskeletal: Normal range of motion.  Palpable thrill of the RUE fistula.  No surrounding erythema, edema, or warmth.   No LE edema  Neurological: He is alert.  Skin: Skin is warm and dry.  Psychiatric: He has a normal mood and affect.  Nursing note and vitals reviewed.   ED Course  Procedures (including critical care time) Labs Review Labs Reviewed - No data to display  Imaging Review No results found. I have personally reviewed and evaluated these images and lab results as part of my medical decision-making.   EKG Interpretation None     11:29 AM Discussed with [facility].  They report that they will discuss with PA and call back. 1:11 PM Discussed with [facility].  They report that the patient can go to [facility] at 7:15 AM on Monday and then follow up with dialysis after. MDM   Final diagnoses:  None   Patient with a history of ESRD on dialysis presents today due to clotting of the AV fistula in the RUE.  Last dialysis was 2 days ago.  Patient is asymptomatic in the ED.  Labs unremarkable, Potassium WNL.  Discussed patient with [facility].  They set patient up to be seen in the [place] on Monday morning April 24th.  He is then scheduled to have dialysis after this.  Do not feel that the patient needs emergent dialysis today.  Patient stable for discharge.  Return precautions given.    Hyman Bible, PA-C 12/01/15 [place], MD 12/02/15 [phone]

## 2015-12-01 NOTE — ED Notes (Signed)
Pt verbalized understanding of d/c instructionsand follow-up care. No further questions/concerns, VSS, assisted to lobby in wheelchair. 

## 2016-01-22 ENCOUNTER — Ambulatory Visit (INDEPENDENT_AMBULATORY_CARE_PROVIDER_SITE_OTHER): Payer: Medicare Other | Admitting: *Deleted

## 2016-01-22 DIAGNOSIS — I429 Cardiomyopathy, unspecified: Secondary | ICD-10-CM | POA: Diagnosis not present

## 2016-01-22 DIAGNOSIS — I428 Other cardiomyopathies: Secondary | ICD-10-CM

## 2016-01-22 NOTE — Progress Notes (Signed)
Remote ICD transmission.   

## 2016-01-28 ENCOUNTER — Other Ambulatory Visit: Payer: Self-pay | Admitting: *Deleted

## 2016-01-28 DIAGNOSIS — Z01810 Encounter for preprocedural cardiovascular examination: Secondary | ICD-10-CM

## 2016-01-28 DIAGNOSIS — N186 End stage renal disease: Secondary | ICD-10-CM

## 2016-01-29 LAB — CUP PACEART REMOTE DEVICE CHECK
Date Time Interrogation Session: 20170613053200
HIGH POWER IMPEDANCE MEASURED VALUE: 93 Ohm
Implantable Lead Implant Date: 20111118
Implantable Lead Serial Number: 313758
Lead Channel Pacing Threshold Amplitude: 0.8 V
Lead Channel Pacing Threshold Pulse Width: 0.5 ms
MDC IDC LEAD LOCATION: 753860
MDC IDC LEAD MODEL: 181
MDC IDC MSMT BATTERY REMAINING LONGEVITY: 96 mo
MDC IDC MSMT BATTERY REMAINING PERCENTAGE: 98 %
MDC IDC MSMT LEADCHNL RV IMPEDANCE VALUE: 660 Ohm
MDC IDC PG SERIAL: 268731
MDC IDC SET LEADCHNL RV PACING AMPLITUDE: 2.4 V
MDC IDC SET LEADCHNL RV PACING PULSEWIDTH: 0.5 ms
MDC IDC SET LEADCHNL RV SENSING SENSITIVITY: 0.5 mV
MDC IDC STAT BRADY RV PERCENT PACED: 0 %

## 2016-01-30 ENCOUNTER — Encounter: Payer: Self-pay | Admitting: Cardiology

## 2016-02-04 DIAGNOSIS — T82818A Embolism of vascular prosthetic devices, implants and grafts, initial encounter: Secondary | ICD-10-CM | POA: Insufficient documentation

## 2016-02-21 ENCOUNTER — Ambulatory Visit (INDEPENDENT_AMBULATORY_CARE_PROVIDER_SITE_OTHER)
Admission: RE | Admit: 2016-02-21 | Discharge: 2016-02-21 | Disposition: A | Discharge status: Home / Self Care | Payer: Medicare Other | Source: Ambulatory Visit | Attending: Vascular Surgery | Admitting: Vascular Surgery

## 2016-02-21 ENCOUNTER — Ambulatory Visit (HOSPITAL_COMMUNITY)
Admission: RE | Admit: 2016-02-21 | Discharge: 2016-02-21 | Disposition: A | Discharge status: Home / Self Care | Payer: Medicare Other | Source: Ambulatory Visit | Attending: Vascular Surgery | Admitting: Vascular Surgery

## 2016-02-21 ENCOUNTER — Encounter: Payer: Self-pay | Admitting: Vascular Surgery

## 2016-02-21 DIAGNOSIS — E11621 Type 2 diabetes mellitus with foot ulcer: Secondary | ICD-10-CM | POA: Insufficient documentation

## 2016-02-21 DIAGNOSIS — N186 End stage renal disease: Secondary | ICD-10-CM

## 2016-02-21 DIAGNOSIS — K219 Gastro-esophageal reflux disease without esophagitis: Secondary | ICD-10-CM | POA: Diagnosis not present

## 2016-02-21 DIAGNOSIS — L97529 Non-pressure chronic ulcer of other part of left foot with unspecified severity: Secondary | ICD-10-CM | POA: Diagnosis not present

## 2016-02-21 DIAGNOSIS — Z01810 Encounter for preprocedural cardiovascular examination: Secondary | ICD-10-CM | POA: Insufficient documentation

## 2016-02-21 DIAGNOSIS — E782 Mixed hyperlipidemia: Secondary | ICD-10-CM | POA: Diagnosis not present

## 2016-02-21 DIAGNOSIS — I509 Heart failure, unspecified: Secondary | ICD-10-CM | POA: Diagnosis not present

## 2016-02-21 DIAGNOSIS — I110 Hypertensive heart disease with heart failure: Secondary | ICD-10-CM | POA: Insufficient documentation

## 2016-02-26 ENCOUNTER — Ambulatory Visit (INDEPENDENT_AMBULATORY_CARE_PROVIDER_SITE_OTHER): Payer: Medicare Other | Admitting: Vascular Surgery

## 2016-02-26 ENCOUNTER — Other Ambulatory Visit: Payer: Self-pay

## 2016-02-26 ENCOUNTER — Encounter: Payer: Self-pay | Admitting: Vascular Surgery

## 2016-02-26 VITALS — BP 92/66 | HR 91 | Temp 97.9°F | Resp 20 | Ht 68.0 in | Wt 191.0 lb

## 2016-02-26 DIAGNOSIS — Z992 Dependence on renal dialysis: Secondary | ICD-10-CM | POA: Diagnosis not present

## 2016-02-26 DIAGNOSIS — N186 End stage renal disease: Secondary | ICD-10-CM

## 2016-02-26 NOTE — Progress Notes (Signed)
[facility] [place]  Patient name: Timothy Faulkner MRN: [id number] DOB: 01/09/1946 Sex: male  REASON FOR VISIT: Discuss future access options.  HPI: Timothy Faulkner is a 70 y.o. male her today for discussion of future access options. He is a very pleasant 70 year old gentleman known to me from prior right upper arm brachiocephalic fistula creating in 2014. He had good access for several years but begin to have difficulty with degeneration of his fistula. He had had attempts at plication of the surgically and also apparently had several stents placed in this at [facility]. Has had thromboses of his upper arm fistula. He does have a tunneled catheter reports this is had to be replaced and is now on his third catheter in the last several weeks.  Past Medical History  Diagnosis Date  . HYPERTENSION   . HYPERCHOLESTEROLEMIA, MIXED   . CHF   . DIAB W/O COMP TYPE II/UNS NOT STATED UNCNTRL   . 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 Anemia   . Heart murmur   . Automatic implantable cardioverter-defibrillator in situ     [employer]  . Pacemaker   . Shortness of breath   . GERD (gastroesophageal reflux disease)   . 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 Peripheral arterial disease (HCC)     left fifth toe ulcer, healing  . Pneumonia   . Acute on chronic renal failure ([place])     [name] 03/11/2013 (03/11/2013) dialysis M/W/F  . Arthritis     Gout  . Macular degeneration     Family History  Problem Relation Age of Onset  . Heart disease Father   . CAD Father     SOCIAL HISTORY: Social History  Substance Use Topics  . Smoking status: Former Smoker -- 0.75 packs/day for 30 years    Types: Cigarettes    Quit date: 11/25/1992  . Smokeless tobacco: Never Used  . Alcohol Use: No     Comment: have not had a drink 2012    No Known Allergies  Current Outpatient  Prescriptions  Medication Sig Dispense Refill  . aspirin EC 81 MG tablet Take 81 mg by mouth daily.    . carvedilol (COREG) 3.125 MG tablet Take 1 tablet (3.125 mg total) by mouth 2 (two) times daily. 180 tablet 3   No current facility-administered medications for this visit.    REVIEW OF SYSTEMS:  [X]  denotes positive finding, [ ]  denotes negative finding Cardiac  Comments:  Chest pain or chest pressure:    Shortness of breath upon exertion:    Short of breath when lying flat:    Irregular heart rhythm:        Vascular    Pain in calf, thigh, or hip brought on by ambulation:    Pain in feet at night that wakes you up from your sleep:     Blood clot in your veins:    Leg swelling:         Pulmonary    Oxygen at home:    Productive cough:     Wheezing:         Neurologic    Sudden weakness in arms or legs:     Sudden numbness in arms or legs:     Sudden onset of difficulty speaking or slurred speech:    Temporary loss of vision in one eye:     Problems with  dizziness:         Gastrointestinal    Blood in stool:     Vomited blood:         Genitourinary    Burning when urinating:     Blood in urine:        Psychiatric    Major depression:         Hematologic    Bleeding problems:    Problems with blood clotting too easily:        Skin    Rashes or ulcers:        Constitutional    Fever or chills:      PHYSICAL EXAM: Filed Vitals:   02/26/16 0935  BP: 92/66  Pulse: 91  Temp: 97.9 F (36.6 C)  TempSrc: Oral  Resp: 20  Height: 5\' 8"  (1.727 m)  Weight: 191 lb (86.637 kg)  SpO2: 98%    GENERAL: The patient is a well-nourished male, in no acute distress. The vital signs are documented above. CARDIAC: There is a regular rate and rhythm.  VASCULAR: 2+ radial pulses bilaterally. Thrombosed right upper arm AV fistula. Good caliber cephalic vein at his forearms bilaterally PULMONARY: There is good air exchange  MUSCULOSKELETAL: There are no major  deformities or cyanosis. NEUROLOGIC: No focal weakness or paresthesias are detected. SKIN: There are no ulcers or rashes noted. PSYCHIATRIC: The patient has a normal affect.  DATA:  Recent outpatient duplex showed triphasic waveforms in his right radial arteries bilaterally. Vein map on the left showed 2-3 mm cephalic vein. This does appear to be larger by physical exam  MEDICAL ISSUES: I imaged his right basilic vein with SonoSite ultrasound. This does show patency of his basilic vein with good caliber throughout its course. He does have a implantable defibrillator in his left chest and would prefer to avoid left arm and possible. I did explain that there is no specific contraindication to this. We will schedule him for right basilic vein transposition fistula by myself. He does dialyze on Monday Wednesday and Friday so we will alter his dialysis today for his surgery.    [name], MD FACS [facility] Tel [phone] Pager [phone]

## 2016-02-28 ENCOUNTER — Encounter: Payer: Self-pay | Admitting: Nephrology

## 2016-03-03 ENCOUNTER — Other Ambulatory Visit: Payer: Self-pay | Admitting: *Deleted

## 2016-03-07 ENCOUNTER — Encounter (HOSPITAL_COMMUNITY): Payer: Self-pay | Admitting: *Deleted

## 2016-03-09 MED ORDER — DEXTROSE 5 % IV SOLN
1.5000 g | INTRAVENOUS | Status: DC
  Filled 2016-03-09: qty 1.5

## 2016-03-10 ENCOUNTER — Ambulatory Visit (HOSPITAL_COMMUNITY)
Admission: RE | Admit: 2016-03-10 | Discharge: 2016-03-10 | Disposition: A | Discharge status: Home / Self Care | Payer: Medicare Other | Source: Ambulatory Visit | Attending: Vascular Surgery | Admitting: Vascular Surgery

## 2016-03-10 ENCOUNTER — Encounter (HOSPITAL_COMMUNITY): Payer: Self-pay | Admitting: Certified Registered Nurse Anesthetist

## 2016-03-10 ENCOUNTER — Encounter (HOSPITAL_COMMUNITY)
Admission: RE | Disposition: A | Discharge status: Home / Self Care | Payer: Self-pay | Source: Ambulatory Visit | Attending: Vascular Surgery

## 2016-03-10 ENCOUNTER — Other Ambulatory Visit: Payer: Self-pay | Admitting: *Deleted

## 2016-03-10 DIAGNOSIS — Z950 Presence of cardiac pacemaker: Secondary | ICD-10-CM | POA: Diagnosis not present

## 2016-03-10 DIAGNOSIS — N186 End stage renal disease: Secondary | ICD-10-CM | POA: Diagnosis not present

## 2016-03-10 DIAGNOSIS — Z992 Dependence on renal dialysis: Secondary | ICD-10-CM | POA: Insufficient documentation

## 2016-03-10 DIAGNOSIS — E782 Mixed hyperlipidemia: Secondary | ICD-10-CM | POA: Insufficient documentation

## 2016-03-10 DIAGNOSIS — I132 Hypertensive heart and chronic kidney disease with heart failure and with stage 5 chronic kidney disease, or end stage renal disease: Secondary | ICD-10-CM | POA: Diagnosis not present

## 2016-03-10 DIAGNOSIS — Z8249 Family history of ischemic heart disease and other diseases of the circulatory system: Secondary | ICD-10-CM | POA: Insufficient documentation

## 2016-03-10 DIAGNOSIS — E1122 Type 2 diabetes mellitus with diabetic chronic kidney disease: Secondary | ICD-10-CM | POA: Insufficient documentation

## 2016-03-10 DIAGNOSIS — Z538 Procedure and treatment not carried out for other reasons: Secondary | ICD-10-CM | POA: Diagnosis not present

## 2016-03-10 DIAGNOSIS — I509 Heart failure, unspecified: Secondary | ICD-10-CM | POA: Diagnosis not present

## 2016-03-10 DIAGNOSIS — I12 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 chronic kidney disease or end stage renal disease: Secondary | ICD-10-CM | POA: Diagnosis present

## 2016-03-10 HISTORY — DX: Gout, unspecified: M10.9

## 2016-03-10 HISTORY — DX: Dependence on renal dialysis: N18.6

## 2016-03-10 HISTORY — DX: Dependence on renal dialysis: Z99.2

## 2016-03-10 LAB — POCT I-STAT 4, (NA,K, GLUC, HGB,HCT)
Glucose, Bld: 80 mg/dL (ref 65–99)
HCT: 37 % — ABNORMAL LOW (ref 39.0–52.0)
Hemoglobin: 12.6 g/dL — ABNORMAL LOW (ref 13.0–17.0)
POTASSIUM: 4.3 mmol/L (ref 3.5–5.1)
Sodium: 140 mmol/L (ref 135–145)

## 2016-03-10 LAB — GLUCOSE, CAPILLARY: GLUCOSE-CAPILLARY: 81 mg/dL (ref 65–99)

## 2016-03-10 SURGERY — TRANSPOSITION, VEIN, BASILIC
Anesthesia: Choice | Laterality: Right

## 2016-03-10 MED ORDER — ROCURONIUM BROMIDE 50 MG/5ML IV SOLN
INTRAVENOUS | Status: AC
  Filled 2016-03-10: qty 1

## 2016-03-10 MED ORDER — ONDANSETRON HCL 4 MG/2ML IJ SOLN
INTRAMUSCULAR | Status: AC
  Filled 2016-03-10: qty 2

## 2016-03-10 MED ORDER — FENTANYL CITRATE (PF) 250 MCG/5ML IJ SOLN
INTRAMUSCULAR | Status: AC
  Filled 2016-03-10: qty 5

## 2016-03-10 MED ORDER — MIDAZOLAM HCL 2 MG/2ML IJ SOLN
INTRAMUSCULAR | Status: AC
  Filled 2016-03-10: qty 2

## 2016-03-10 MED ORDER — CHLORHEXIDINE GLUCONATE CLOTH 2 % EX PADS: 6.0000 | MEDICATED_PAD | Freq: Once | CUTANEOUS | Status: DC

## 2016-03-10 MED ORDER — PROPOFOL 10 MG/ML IV BOLUS
INTRAVENOUS | Status: AC
  Filled 2016-03-10: qty 20

## 2016-03-10 MED ORDER — SODIUM CHLORIDE 0.9 % IV SOLN: INTRAVENOUS | Status: DC

## 2016-03-25 ENCOUNTER — Encounter (HOSPITAL_COMMUNITY): Payer: Self-pay | Admitting: *Deleted

## 2016-03-25 NOTE — Anesthesia Preprocedure Evaluation (Addendum)
Anesthesia Evaluation  Patient identified by MRN, date of birth, ID band Patient awake    Reviewed: Allergy & Precautions, NPO status , Patient's Chart, lab work & pertinent test results, reviewed documented beta blocker date and time   History of Anesthesia Complications Negative for: history of anesthetic complications  Airway Mallampati: II  TM Distance: >3 FB Neck ROM: Full    Dental  (+) Edentulous Upper, Poor Dentition, Missing, Dental Advisory Given   Pulmonary COPD, former smoker,    breath sounds clear to auscultation       Cardiovascular hypertension, Pt. on medications and Pt. on home beta blockers (-) angina+ Peripheral Vascular Disease  + pacemaker + Cardiac Defibrillator  Rhythm:Regular Rate:Normal  '14 ECHO: EF 20-25%, mild MR, mod TR '14 stress: no ischemia, EF 28%   Neuro/Psych negative neurological ROS     GI/Hepatic Neg liver ROS, GERD  Poorly Controlled,  Endo/Other  diabetes (glu 73)  Renal/GU ESRF and DialysisRenal disease (MWF, K+ 4.4)     Musculoskeletal   Abdominal (+) - obese,   Peds  Hematology negative hematology ROS (+)   Anesthesia Other Findings   Reproductive/Obstetrics                            Anesthesia Physical Anesthesia Plan  ASA: III  Anesthesia Plan: General   Post-op Pain Management:    Induction: Intravenous  Airway Management Planned: Oral ETT  Additional Equipment:   Intra-op Plan:   Post-operative Plan: Extubation in OR  Informed Consent: I have reviewed the patients History and Physical, chart, labs and discussed the procedure including the risks, benefits and alternatives for the proposed anesthesia with the patient or authorized representative who has indicated his/her understanding and acceptance.   Dental advisory given  Plan Discussed with: CRNA and Surgeon  Anesthesia Plan Comments: (Plan routine monitors, GETA)         Anesthesia Quick Evaluation

## 2016-03-26 ENCOUNTER — Encounter (HOSPITAL_COMMUNITY): Payer: Self-pay | Admitting: *Deleted

## 2016-03-26 ENCOUNTER — Ambulatory Visit (HOSPITAL_COMMUNITY)
Admission: RE | Admit: 2016-03-26 | Discharge: 2016-03-26 | Disposition: A | Discharge status: Home / Self Care | Payer: Medicare Other | Source: Ambulatory Visit | Attending: Vascular Surgery | Admitting: Vascular Surgery

## 2016-03-26 ENCOUNTER — Encounter (HOSPITAL_COMMUNITY)
Admission: RE | Disposition: A | Discharge status: Home / Self Care | Payer: Self-pay | Source: Ambulatory Visit | Attending: Vascular Surgery

## 2016-03-26 ENCOUNTER — Ambulatory Visit (HOSPITAL_COMMUNITY): Payer: Medicare Other | Admitting: Certified Registered"

## 2016-03-26 ENCOUNTER — Telehealth: Payer: Self-pay | Admitting: Vascular Surgery

## 2016-03-26 ENCOUNTER — Emergency Department (HOSPITAL_COMMUNITY)
Admission: EM | Admit: 2016-03-26 | Discharge: 2016-03-27 | Disposition: A | Discharge status: Home / Self Care | Payer: Medicare Other | Source: Home / Self Care | Attending: Emergency Medicine | Admitting: Emergency Medicine

## 2016-03-26 DIAGNOSIS — Z87891 Personal history of nicotine dependence: Secondary | ICD-10-CM | POA: Insufficient documentation

## 2016-03-26 DIAGNOSIS — J449 Chronic obstructive pulmonary disease, unspecified: Secondary | ICD-10-CM | POA: Insufficient documentation

## 2016-03-26 DIAGNOSIS — E1122 Type 2 diabetes mellitus with diabetic chronic kidney disease: Secondary | ICD-10-CM | POA: Insufficient documentation

## 2016-03-26 DIAGNOSIS — E669 Obesity, unspecified: Secondary | ICD-10-CM | POA: Diagnosis not present

## 2016-03-26 DIAGNOSIS — I132 Hypertensive heart and chronic kidney disease with heart failure and with stage 5 chronic kidney disease, or end stage renal disease: Secondary | ICD-10-CM | POA: Insufficient documentation

## 2016-03-26 DIAGNOSIS — I429 Cardiomyopathy, unspecified: Secondary | ICD-10-CM | POA: Insufficient documentation

## 2016-03-26 DIAGNOSIS — I509 Heart failure, unspecified: Secondary | ICD-10-CM | POA: Diagnosis not present

## 2016-03-26 DIAGNOSIS — T82838A Hemorrhage of vascular prosthetic devices, implants and grafts, initial encounter: Secondary | ICD-10-CM

## 2016-03-26 DIAGNOSIS — Z79899 Other long term (current) drug therapy: Secondary | ICD-10-CM | POA: Diagnosis not present

## 2016-03-26 DIAGNOSIS — Z7982 Long term (current) use of aspirin: Secondary | ICD-10-CM | POA: Diagnosis not present

## 2016-03-26 DIAGNOSIS — E1151 Type 2 diabetes mellitus with diabetic peripheral angiopathy without gangrene: Secondary | ICD-10-CM | POA: Insufficient documentation

## 2016-03-26 DIAGNOSIS — N185 Chronic kidney disease, stage 5: Secondary | ICD-10-CM | POA: Diagnosis not present

## 2016-03-26 DIAGNOSIS — Z6829 Body mass index (BMI) 29.0-29.9, adult: Secondary | ICD-10-CM | POA: Insufficient documentation

## 2016-03-26 DIAGNOSIS — N186 End stage renal disease: Secondary | ICD-10-CM | POA: Diagnosis not present

## 2016-03-26 DIAGNOSIS — Z95810 Presence of automatic (implantable) cardiac defibrillator: Secondary | ICD-10-CM | POA: Diagnosis not present

## 2016-03-26 HISTORY — PX: BASCILIC VEIN TRANSPOSITION: SHX5742

## 2016-03-26 LAB — POCT I-STAT 4, (NA,K, GLUC, HGB,HCT)
GLUCOSE: 73 mg/dL (ref 65–99)
HEMATOCRIT: 40 % (ref 39.0–52.0)
Hemoglobin: 13.6 g/dL (ref 13.0–17.0)
POTASSIUM: 4.4 mmol/L (ref 3.5–5.1)
SODIUM: 139 mmol/L (ref 135–145)

## 2016-03-26 LAB — GLUCOSE, CAPILLARY
GLUCOSE-CAPILLARY: 80 mg/dL (ref 65–99)
Glucose-Capillary: 117 mg/dL — ABNORMAL HIGH (ref 65–99)

## 2016-03-26 SURGERY — TRANSPOSITION, VEIN, BASILIC
Anesthesia: General | Site: Arm Upper | Laterality: Right

## 2016-03-26 MED ORDER — FENTANYL CITRATE (PF) 100 MCG/2ML IJ SOLN
INTRAMUSCULAR | Status: AC
  Filled 2016-03-26: qty 2

## 2016-03-26 MED ORDER — NEOSTIGMINE METHYLSULFATE 10 MG/10ML IV SOLN
INTRAVENOUS | Status: DC | PRN
  Administered 2016-03-26: 4 mg via INTRAVENOUS

## 2016-03-26 MED ORDER — SODIUM CHLORIDE 0.9 % IV SOLN
INTRAVENOUS | Status: DC | PRN
  Administered 2016-03-26: 500 mL

## 2016-03-26 MED ORDER — PHENYLEPHRINE HCL 10 MG/ML IJ SOLN
INTRAMUSCULAR | Status: DC | PRN
  Administered 2016-03-26 (×2): 40 ug via INTRAVENOUS
  Administered 2016-03-26: 120 ug via INTRAVENOUS
  Administered 2016-03-26: 80 ug via INTRAVENOUS
  Administered 2016-03-26: 40 ug via INTRAVENOUS
  Administered 2016-03-26 (×3): 80 ug via INTRAVENOUS

## 2016-03-26 MED ORDER — PROPOFOL 10 MG/ML IV BOLUS
INTRAVENOUS | Status: AC
  Filled 2016-03-26: qty 20

## 2016-03-26 MED ORDER — PHENYLEPHRINE HCL 10 MG/ML IJ SOLN
INTRAVENOUS | Status: DC | PRN
  Administered 2016-03-26: 30 ug/min via INTRAVENOUS
  Administered 2016-03-26: 50 ug/min via INTRAVENOUS

## 2016-03-26 MED ORDER — GLYCOPYRROLATE 0.2 MG/ML IV SOSY
PREFILLED_SYRINGE | INTRAVENOUS | Status: AC
  Filled 2016-03-26: qty 3

## 2016-03-26 MED ORDER — PROPOFOL 1000 MG/100ML IV EMUL
INTRAVENOUS | Status: AC
  Filled 2016-03-26: qty 300

## 2016-03-26 MED ORDER — CHLORHEXIDINE GLUCONATE CLOTH 2 % EX PADS: 6.0000 | MEDICATED_PAD | Freq: Once | CUTANEOUS | Status: DC

## 2016-03-26 MED ORDER — ROCURONIUM BROMIDE 100 MG/10ML IV SOLN
INTRAVENOUS | Status: DC | PRN
  Administered 2016-03-26: 30 mg via INTRAVENOUS
  Administered 2016-03-26: 20 mg via INTRAVENOUS

## 2016-03-26 MED ORDER — FENTANYL CITRATE (PF) 100 MCG/2ML IJ SOLN
25.0000 ug | INTRAMUSCULAR | Status: DC | PRN
  Administered 2016-03-26: 25 ug via INTRAVENOUS

## 2016-03-26 MED ORDER — FENTANYL CITRATE (PF) 100 MCG/2ML IJ SOLN
INTRAMUSCULAR | Status: DC | PRN
  Administered 2016-03-26 (×2): 50 ug via INTRAVENOUS

## 2016-03-26 MED ORDER — SUCCINYLCHOLINE CHLORIDE 200 MG/10ML IV SOSY
PREFILLED_SYRINGE | INTRAVENOUS | Status: AC
  Filled 2016-03-26: qty 10

## 2016-03-26 MED ORDER — MIDAZOLAM HCL 2 MG/2ML IJ SOLN: 0.5000 mg | Freq: Once | INTRAMUSCULAR | Status: DC | PRN

## 2016-03-26 MED ORDER — NEOSTIGMINE METHYLSULFATE 5 MG/5ML IV SOSY
PREFILLED_SYRINGE | INTRAVENOUS | Status: AC
  Filled 2016-03-26: qty 5

## 2016-03-26 MED ORDER — DEXTROSE 5 % IV SOLN
1.5000 g | INTRAVENOUS | Status: AC
  Administered 2016-03-26: 1.5 g via INTRAVENOUS
  Filled 2016-03-26: qty 1.5

## 2016-03-26 MED ORDER — GLYCOPYRROLATE 0.2 MG/ML IJ SOLN
INTRAMUSCULAR | Status: DC | PRN
  Administered 2016-03-26: .6 mg via INTRAVENOUS

## 2016-03-26 MED ORDER — EPHEDRINE SULFATE 50 MG/ML IJ SOLN
INTRAMUSCULAR | Status: DC | PRN
  Administered 2016-03-26: 5 mg via INTRAVENOUS
  Administered 2016-03-26: 10 mg via INTRAVENOUS

## 2016-03-26 MED ORDER — SUCCINYLCHOLINE CHLORIDE 20 MG/ML IJ SOLN
INTRAMUSCULAR | Status: DC | PRN
  Administered 2016-03-26: 100 mg via INTRAVENOUS

## 2016-03-26 MED ORDER — EPHEDRINE SULFATE 50 MG/ML IJ SOLN
INTRAMUSCULAR | Status: AC
  Filled 2016-03-26: qty 2

## 2016-03-26 MED ORDER — PHENYLEPHRINE 40 MCG/ML (10ML) SYRINGE FOR IV PUSH (FOR BLOOD PRESSURE SUPPORT)
PREFILLED_SYRINGE | INTRAVENOUS | Status: AC
  Filled 2016-03-26: qty 10

## 2016-03-26 MED ORDER — 0.9 % SODIUM CHLORIDE (POUR BTL) OPTIME
TOPICAL | Status: DC | PRN
  Administered 2016-03-26: 1000 mL

## 2016-03-26 MED ORDER — SODIUM CHLORIDE 0.9 % IV SOLN
INTRAVENOUS | Status: DC
  Administered 2016-03-26 (×2): via INTRAVENOUS

## 2016-03-26 MED ORDER — MEPERIDINE HCL 25 MG/ML IJ SOLN: 6.2500 mg | INTRAMUSCULAR | Status: DC | PRN

## 2016-03-26 MED ORDER — ETOMIDATE 2 MG/ML IV SOLN
INTRAVENOUS | Status: AC
  Filled 2016-03-26: qty 10

## 2016-03-26 MED ORDER — PROMETHAZINE HCL 25 MG/ML IJ SOLN: 6.2500 mg | INTRAMUSCULAR | Status: DC | PRN

## 2016-03-26 MED ORDER — ETOMIDATE 2 MG/ML IV SOLN
INTRAVENOUS | Status: DC | PRN
  Administered 2016-03-26: 34 mg via INTRAVENOUS

## 2016-03-26 MED ORDER — ONDANSETRON HCL 4 MG/2ML IJ SOLN
INTRAMUSCULAR | Status: AC
  Filled 2016-03-26: qty 2

## 2016-03-26 MED ORDER — DEXAMETHASONE SODIUM PHOSPHATE 10 MG/ML IJ SOLN
INTRAMUSCULAR | Status: DC | PRN
  Administered 2016-03-26: 5 mg via INTRAVENOUS

## 2016-03-26 MED ORDER — LIDOCAINE HCL (CARDIAC) 20 MG/ML IV SOLN
INTRAVENOUS | Status: DC | PRN
  Administered 2016-03-26: 60 mg via INTRAVENOUS

## 2016-03-26 MED ORDER — DEXAMETHASONE SODIUM PHOSPHATE 10 MG/ML IJ SOLN
INTRAMUSCULAR | Status: AC
  Filled 2016-03-26: qty 1

## 2016-03-26 MED ORDER — OXYCODONE-ACETAMINOPHEN 5-325 MG PO TABS: 1.0000 | ORAL_TABLET | Freq: Four times a day (QID) | ORAL | 0 refills | Status: DC | PRN

## 2016-03-26 MED ORDER — LIDOCAINE-EPINEPHRINE (PF) 1 %-1:200000 IJ SOLN
INTRAMUSCULAR | Status: AC
  Filled 2016-03-26: qty 30

## 2016-03-26 MED ORDER — ONDANSETRON HCL 4 MG/2ML IJ SOLN
INTRAMUSCULAR | Status: DC | PRN
  Administered 2016-03-26: 4 mg via INTRAVENOUS

## 2016-03-26 MED ORDER — SODIUM CHLORIDE 0.9 % IJ SOLN
INTRAMUSCULAR | Status: AC
  Filled 2016-03-26: qty 10

## 2016-03-26 MED ORDER — MIDAZOLAM HCL 2 MG/2ML IJ SOLN
INTRAMUSCULAR | Status: AC
  Filled 2016-03-26: qty 2

## 2016-03-26 MED ORDER — ROCURONIUM BROMIDE 10 MG/ML (PF) SYRINGE
PREFILLED_SYRINGE | INTRAVENOUS | Status: AC
  Filled 2016-03-26: qty 10

## 2016-03-26 MED ORDER — LIDOCAINE 2% (20 MG/ML) 5 ML SYRINGE
INTRAMUSCULAR | Status: AC
  Filled 2016-03-26: qty 5

## 2016-03-26 SURGICAL SUPPLY — 40 items
APL SKNCLS STERI-STRIP NONHPOA (GAUZE/BANDAGES/DRESSINGS) ×1
ARMBAND PINK RESTRICT EXTREMIT (MISCELLANEOUS) ×2 IMPLANT
BENZOIN TINCTURE PRP APPL 2/3 (GAUZE/BANDAGES/DRESSINGS) ×2 IMPLANT
CANISTER SUCTION 2500CC (MISCELLANEOUS) ×2 IMPLANT
CANNULA VESSEL 3MM 2 BLNT TIP (CANNULA) ×2 IMPLANT
CLIP LIGATING EXTRA MED SLVR (CLIP) ×2 IMPLANT
CLIP LIGATING EXTRA SM BLUE (MISCELLANEOUS) ×2 IMPLANT
CLSR STERI-STRIP ANTIMIC 1/2X4 (GAUZE/BANDAGES/DRESSINGS) ×1 IMPLANT
COVER PROBE W GEL 5X96 (DRAPES) ×2 IMPLANT
DECANTER SPIKE VIAL GLASS SM (MISCELLANEOUS) ×1 IMPLANT
ELECT REM PT RETURN 9FT ADLT (ELECTROSURGICAL) ×2
ELECTRODE REM PT RTRN 9FT ADLT (ELECTROSURGICAL) ×1 IMPLANT
GAUZE SPONGE 4X4 12PLY STRL (GAUZE/BANDAGES/DRESSINGS) ×2 IMPLANT
GEL ULTRASOUND 20GR AQUASONIC (MISCELLANEOUS) IMPLANT
GLOVE BIO SURGEON STRL SZ 6.5 (GLOVE) ×1 IMPLANT
GLOVE BIOGEL PI IND STRL 6.5 (GLOVE) IMPLANT
GLOVE BIOGEL PI IND STRL 7.0 (GLOVE) IMPLANT
GLOVE BIOGEL PI INDICATOR 6.5 (GLOVE) ×3
GLOVE BIOGEL PI INDICATOR 7.0 (GLOVE) ×1
GLOVE ECLIPSE 6.5 STRL STRAW (GLOVE) ×1 IMPLANT
GLOVE SS BIOGEL STRL SZ 7.5 (GLOVE) ×1 IMPLANT
GLOVE SUPERSENSE BIOGEL SZ 7.5 (GLOVE) ×1
GOWN STRL REUS W/ TWL LRG LVL3 (GOWN DISPOSABLE) ×3 IMPLANT
GOWN STRL REUS W/TWL LRG LVL3 (GOWN DISPOSABLE) ×6
KIT BASIN OR (CUSTOM PROCEDURE TRAY) ×2 IMPLANT
KIT ROOM TURNOVER OR (KITS) ×2 IMPLANT
NS IRRIG 1000ML POUR BTL (IV SOLUTION) ×2 IMPLANT
PACK CV ACCESS (CUSTOM PROCEDURE TRAY) ×2 IMPLANT
PAD ARMBOARD 7.5X6 YLW CONV (MISCELLANEOUS) ×4 IMPLANT
SPONGE GAUZE 4X4 12PLY STER LF (GAUZE/BANDAGES/DRESSINGS) ×1 IMPLANT
STRIP CLOSURE SKIN 1/2X4 (GAUZE/BANDAGES/DRESSINGS) ×1 IMPLANT
SUT PROLENE 6 0 CC (SUTURE) ×3 IMPLANT
SUT SILK 2 0 SH (SUTURE) ×2 IMPLANT
SUT SILK 3 0 (SUTURE) ×2
SUT SILK 3-0 18XBRD TIE 12 (SUTURE) IMPLANT
SUT VIC AB 3-0 SH 27 (SUTURE) ×4
SUT VIC AB 3-0 SH 27X BRD (SUTURE) ×1 IMPLANT
TAPE CLOTH SURG 4X10 WHT LF (GAUZE/BANDAGES/DRESSINGS) ×1 IMPLANT
UNDERPAD 30X30 INCONTINENT (UNDERPADS AND DIAPERS) ×2 IMPLANT
WATER STERILE IRR 1000ML POUR (IV SOLUTION) ×2 IMPLANT

## 2016-03-26 NOTE — H&P (View-Only) (Signed)
V[facility]ist of [place]  Patient name: Timothy Faulkner MRN: [id number] DOB: April 28, 1946 Sex: male  REASON FOR VISIT: Discuss future access options.  HPI: Timothy Faulkner is a 70 y.o. male her today for discussion of future access options. He is a very pleasant 70 year old gentleman known to me from prior right upper arm brachiocephalic fistula creating in 2014. He had good access for several years but begin to have difficulty with degeneration of his fistula. He had had attempts at plication of the surgically and also apparently had several stents placed in this at [facility]. Has had thromboses of his upper arm fistula. He does have a tunneled catheter reports this is had to be replaced and is now on his third catheter in the last several weeks.  Past Medical History  Diagnosis Date  . HYPERTENSION   . HYPERCHOLESTEROLEMIA, MIXED   . CHF   . DIAB W/O COMP TYPE II/UNS NOT STATED UNCNTRL   . 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 Anemia   . Heart murmur   . Automatic implantable cardioverter-defibrillator in situ     [employer]  . Pacemaker   . Shortness of breath   . GERD (gastroesophageal reflux disease)   . 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 Peripheral arterial disease (HCC)     left fifth toe ulcer, healing  . Pneumonia   . Acute on chronic renal failure ([place])     [name] 03/11/2013 (03/11/2013) dialysis M/W/F  . Arthritis     Gout  . Macular degeneration     Family History  Problem Relation Age of Onset  . Heart disease Father   . CAD Father     SOCIAL HISTORY: Social History  Substance Use Topics  . Smoking status: Former Smoker -- 0.75 packs/day for 30 years    Types: Cigarettes    Quit date: 11/25/1992  . Smokeless tobacco: Never Used  . Alcohol Use: No     Comment: have not had a drink 2012    No Known Allergies  Current Outpatient  Prescriptions  Medication Sig Dispense Refill  . aspirin EC 81 MG tablet Take 81 mg by mouth daily.    . carvedilol (COREG) 3.125 MG tablet Take 1 tablet (3.125 mg total) by mouth 2 (two) times daily. 180 tablet 3   No current facility-administered medications for this visit.    REVIEW OF SYSTEMS:  [X]  denotes positive finding, [ ]  denotes negative finding Cardiac  Comments:  Chest pain or chest pressure:    Shortness of breath upon exertion:    Short of breath when lying flat:    Irregular heart rhythm:        Vascular    Pain in calf, thigh, or hip brought on by ambulation:    Pain in feet at night that wakes you up from your sleep:     Blood clot in your veins:    Leg swelling:         Pulmonary    Oxygen at home:    Productive cough:     Wheezing:         Neurologic    Sudden weakness in arms or legs:     Sudden numbness in arms or legs:     Sudden onset of difficulty speaking or slurred speech:    Temporary loss of vision in one eye:     Problems with  dizziness:         Gastrointestinal    Blood in stool:     Vomited blood:         Genitourinary    Burning when urinating:     Blood in urine:        Psychiatric    Major depression:         Hematologic    Bleeding problems:    Problems with blood clotting too easily:        Skin    Rashes or ulcers:        Constitutional    Fever or chills:      PHYSICAL EXAM: Filed Vitals:   02/26/16 0935  BP: 92/66  Pulse: 91  Temp: 97.9 F (36.6 C)  TempSrc: Oral  Resp: 20  Height: 5\' 8"  (1.727 m)  Weight: 191 lb (86.637 kg)  SpO2: 98%    GENERAL: The patient is a well-nourished male, in no acute distress. The vital signs are documented above. CARDIAC: There is a regular rate and rhythm.  VASCULAR: 2+ radial pulses bilaterally. Thrombosed right upper arm AV fistula. Good caliber cephalic vein at his forearms bilaterally PULMONARY: There is good air exchange  MUSCULOSKELETAL: There are no major  deformities or cyanosis. NEUROLOGIC: No focal weakness or paresthesias are detected. SKIN: There are no ulcers or rashes noted. PSYCHIATRIC: The patient has a normal affect.  DATA:  Recent outpatient duplex showed triphasic waveforms in his right radial arteries bilaterally. Vein map on the left showed 2-3 mm cephalic vein. This does appear to be larger by physical exam  MEDICAL ISSUES: I imaged his right basilic vein with SonoSite ultrasound. This does show patency of his basilic vein with good caliber throughout its course. He does have a implantable defibrillator in his left chest and would prefer to avoid left arm and possible. I did explain that there is no specific contraindication to this. We will schedule him for right basilic vein transposition fistula by myself. He does dialyze on Monday Wednesday and Friday so we will alter his dialysis today for his surgery.    [name], MD FACS [facility] of [facility] Tel [phone] Pager [phone]

## 2016-03-26 NOTE — Anesthesia Procedure Notes (Signed)
Procedure Name: Intubation Date/Time: 03/26/2016 8:48 AM Performed by: Freddie Breech Pre-anesthesia Checklist: Patient identified, Emergency Drugs available, Suction available and Patient being monitored Patient Re-evaluated:Patient Re-evaluated prior to inductionOxygen Delivery Method: Circle System Utilized Preoxygenation: Pre-oxygenation with 100% oxygen Intubation Type: IV induction Ventilation: Mask ventilation without difficulty Laryngoscope Size: Mac and 4 Grade View: Grade I Tube type: Oral Tube size: 7.0 mm Number of attempts: 1 Airway Equipment and Method: Stylet and Oral airway Placement Confirmation: ETT inserted through vocal cords under direct vision,  positive ETCO2 and breath sounds checked- equal and bilateral Secured at: 23 cm Tube secured with: Tape Dental Injury: Teeth and Oropharynx as per pre-operative assessment

## 2016-03-26 NOTE — Interval H&P Note (Signed)
History and Physical Interval Note:  03/26/2016 8:22 AM  Timothy Faulkner  has presented today for surgery, with the diagnosis of Chronic kidney disease  The various methods of treatment have been discussed with the patient and family. After consideration of risks, benefits and other options for treatment, the patient has consented to  Procedure(s): BASILIC VEIN TRANSPOSITION (Right) as a surgical intervention .  The patient's history has been reviewed, patient examined, no change in status, stable for surgery.  I have reviewed the patient's chart and labs.  Questions were answered to the patient's satisfaction.     Curt Jews

## 2016-03-26 NOTE — ED Triage Notes (Signed)
Pt reports having HD access placed today @ 6am, pt reports discharge from Hospital today @ 14:00, pt presents today d/t increased bleeding at site, bleeding controlled upon arrival to ED, dressing changed, pt has x3 incisions to R inner arm, A&O x4, denies dizziness

## 2016-03-26 NOTE — Op Note (Signed)
    OPERATIVE REPORT  DATE OF SURGERY: 03/26/2016  PATIENT: Timothy Faulkner, 70 y.o. male MRN: [id number]  DOB: January 13, 1946  PRE-OPERATIVE DIAGNOSIS: End-stage renal disease  POST-OPERATIVE DIAGNOSIS:  Same  PROCEDURE: Right arm basilic vein transposition fistula all through that with the same procedure  SURGEON:  Curt Jews, M.D.  PHYSICIAN ASSISTANT: Silva Bandy PA-C  ANESTHESIA:  Gen.  EBL: Minimal ml  Total I/O In: 600 [I.V.:600] Out: 50 [Blood:50]  BLOOD ADMINISTERED: None  DRAINS: None  SPECIMEN: None  COUNTS CORRECT:  YES  PLAN OF CARE: PACU   PATIENT DISPOSITION:  PACU - hemodynamically stable  PROCEDURE DETAILS: The patient was taken to the operating placed supine position where the area of the right arm right axillar prepped and draped in sterile fashion. SonoSite she is a visualize the basilic vein which was intact throughout its course. It was of moderate size. Incision was made over the basilic vein on the medial aspect below the level of the elbow separate incision was made in the mid upper arm and a third incision was made at the axilla. The vein was mobilized through the Delano 3 incisions. Tributary branches were ligated with 3 or 4-0 silk ties and divided. The vein was marked while in place to reduce her risk for twisting with tunneling. The vein was ligated distally and divided and was gently dilated with heparinized saline was felt to be approximate 5 mm in diameter. The vein was brought out through the axillary incision. A tunneler was used to create a tunnel from the level of the antecubital space to the axilla. The brachial artery been exposed through a separate incision at the antecubital space. The vein was brought to the subcuticular tunnel taking care not to twist the vein. The brachial artery was occluded proximal and distally was opened with 11 blade some ulcerative with Potts scissors. The vein was cut to appropriate length and was spatulated and sewn  end-to-side to the artery with a running 6-0 Prolene suture. Clamps removed and excellent thrill was noted. The wound irrigated with saline. Hemostasis tablet cautery and wounds were closed with 3-0 Vicryl in the subcutaneous and subcuticular tissue. Benzoin and Steri-Strips were applied   Curt Jews, M.D. 03/26/2016 2:34 PM

## 2016-03-26 NOTE — Telephone Encounter (Signed)
Mr. Nivens daughter called to say that his surgery site was bleeding through the bandages applied at the hospital and the additional bandages that she had added on top of them.  On the advice of Estanislado Pandy, RN, Venida Jarvis was instructed to take the patient to the nearest emergency room for treatment.  She said that the nearest ER was [facility].  Sherri was reassured that vascular surgery could be consulted (and was on call at [facility]) if the ER treatment team felt that was needed.  Ovidio Hanger

## 2016-03-26 NOTE — Anesthesia Postprocedure Evaluation (Signed)
Anesthesia Post Note  Patient: Timothy Faulkner  Procedure(s) Performed: Procedure(s) (LRB): RIGHT BASILIC VEIN TRANSPOSITION (Right)  Patient location during evaluation: PACU Anesthesia Type: General Level of consciousness: awake and alert, patient cooperative and oriented Pain management: pain level controlled Vital Signs Assessment: post-procedure vital signs reviewed and stable Respiratory status: spontaneous breathing, nonlabored ventilation and respiratory function stable Cardiovascular status: blood pressure returned to baseline and stable Postop Assessment: no signs of nausea or vomiting Anesthetic complications: no    Last Vitals:  Vitals:   03/26/16 1230 03/26/16 1238  BP:  114/67  Pulse: 75 76  Resp: 15 17  Temp:  36.6 C    Last Pain:  Vitals:   03/26/16 1200  PainSc: Asleep                 Kalep Full,E. [place]

## 2016-03-26 NOTE — Transfer of Care (Signed)
Immediate Anesthesia Transfer of Care Note  Patient: Timothy Faulkner  Procedure(s) Performed: Procedure(s): RIGHT BASILIC VEIN TRANSPOSITION (Right)  Patient Location: PACU  Anesthesia Type:General  Level of Consciousness:  sedated, patient cooperative and responds to stimulation  Airway & Oxygen Therapy:Patient Spontanous Breathing and Patient connected to face mask oxgen  Post-op Assessment:  Report given to PACU RN and Post -op Vital signs reviewed and stable  Post vital signs:  Reviewed and stable  Last Vitals:  Vitals:   03/26/16 0729 03/26/16 1124  BP: (!) 91/44 (!) 176/99  Pulse: 73 87  Resp:  20  Temp:  123XX123 C    Complications: No apparent anesthesia complications

## 2016-03-27 ENCOUNTER — Encounter (HOSPITAL_COMMUNITY): Payer: Self-pay | Admitting: Vascular Surgery

## 2016-03-27 NOTE — Discharge Instructions (Signed)
You have bleeding from your newly  place vascular access  The dressing has been changed and reinforced.  If you do develop slight bleeding from the area.  Hold pressure directly over the suture line rather than diffusely over the area.  Return to the emergency department if needed.  Please call Dr. early in the morning to discuss today's visit

## 2016-03-27 NOTE — ED Provider Notes (Signed)
Timothy Faulkner   CSN: [id number] Arrival date & time: 03/26/16  1736     History   Chief Complaint Chief Complaint  Patient presents with  . Vascular Access Problem    HPI Timothy Faulkner is a 70 y.o. male.  This is a 70 year old male who had vascular access revision earlier in the day by Dr. Sherren Mocha early on his way home he noticed some bleeding from the site.  It was dripping.  He panicked.  He came to the emergency department for evaluation.  He sat in the waiting for.  Of time.  By the time I examined him bleeding had stopped      Past Medical History:  Diagnosis Date  . Anemia   . Arthritis    Gout  . Automatic implantable cardioverter-defibrillator in situ    [employer]  . CHF   . DIAB W/O COMP TYPE II/UNS NOT STATED UNCNTRL    No medication  . ESRD on dialysis [facility])    [name] 03/11/2013 (03/11/2013) dialysis M/W/F  . ESRD on dialysis ([place])   . GERD (gastroesophageal reflux disease)   . Gout    "once a year"  . Heart murmur   . HYPERCHOLESTEROLEMIA, MIXED   . HYPERTENSION   . Macular degeneration   . 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 Pacemaker   . Peripheral arterial disease (HCC)    left fifth toe ulcer, healing  . Pneumonia   . Shortness of breath   . 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 Chronic systolic heart failure ([place]) 08/22/2014  . 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 ([place]) 07/13/2014  . ESRD on dialysis ([place]) 11/11/2013  . Snoring 11/11/2013  . Unspecified sleep apnea 11/11/2013  . ESRD on hemodialysis ([place]) 09/22/2013  . Other pancytopenia ([place]) 09/22/2013  . Hemoptysis 09/20/2013  . End stage renal disease ([place]) 05/24/2013  . Chronic kidney disease (CKD), stage IV (severe) 11/25/2012  . Automatic implantable cardioverter-defibrillator in situ 10/01/2010  . DIAB W/O COMP TYPE II/UNS NOT  STATED UNCNTRL 05/03/2010  . HYPERCHOLESTEROLEMIA, MIXED 05/03/2010  . Essential hypertension 05/03/2010  . CHF 05/03/2010    Past Surgical History:  Procedure Laterality Date  . AV FISTULA PLACEMENT Right 12/13/2012   Procedure: ARTERIOVENOUS (AV) FISTULA CREATION;  Surgeon: [name], MD;  Location: [place];  Service: Vascular;  Laterality: Right;  Ultrasound guided  . CARDIAC CATHETERIZATION    . CARDIAC DEFIBRILLATOR PLACEMENT     Chemical engineer  . EYE SURGERY Bilateral    Cataract  . INCISION AND DRAINAGE ABSCESS / HEMATOMA OF BURSA / KNEE / THIGH Left 2012   "knee" (03/11/2013)  . INSERT / REPLACE / REMOVE PACEMAKER    . REVISON OF ARTERIOVENOUS FISTULA Right [id number]   Procedure: Plication OF Right Arm ARTERIOVENOUS FISTULA;  Surgeon: Elam Dutch, MD;  Location: [facility];  Service: Vascular;  Laterality: Right;  . SHUNTOGRAM N/A 05/31/2013   Procedure: Earney Mallet;  Surgeon: [name], MD;  Location[facility] C[facility] LAB;  Service: Cardiovascular;  Laterality: N/A;       Home Medications    Prior to Admission medications   Medication Sig Start Date End Date Taking? Authorizing Provider  aspirin EC 81 MG tablet Take 81 mg by mouth daily.    Historical Provider, MD  carvedilol (COREG) 3.125 MG  tablet Take 1 tablet (3.125 mg total) by mouth 2 (two) times daily. 08/22/14   Evans Lance, MD  oxyCODONE-acetaminophen (ROXICET) 5-325 MG tablet Take 1-2 tablets by mouth every 6 (six) hours as needed for severe pain. 03/26/16   Alvia Grove, PA-C    Family History Family History  Problem Relation Age of Onset  . Heart disease Father   . CAD Father     Social History Social History  Substance Use Topics  . Smoking status: Former Smoker    Packs/day: 0.75    Years: 30.00    Types: Cigarettes    Quit date: 11/25/1992  . Smokeless tobacco: Never Used  . Alcohol use No     Comment: have not had a drink 2012     Allergies   No known allergies   Review of  Systems Review of Systems  Skin: Positive for wound.  All other systems reviewed and are negative.    Physical Exam Updated Vital Signs BP 112/65 (BP Location: Left Arm)   Pulse 71   Temp 98.2 F (36.8 C) (Oral)   Resp 18   SpO2 100%   Physical Exam  Constitutional: He appears well-developed and well-nourished.  Eyes: Pupils are equal, round, and reactive to light.  Neck: Normal range of motion.  Cardiovascular: Normal rate.   Pulmonary/Chest: Effort normal.  Musculoskeletal: Normal range of motion. He exhibits tenderness.       Arms: Patient has full range of motion of the right arm.  Positive distal pulses, cap refill less than 3 seconds.  Neurological: He is alert.  Nursing Faulkner and vitals reviewed.    ED Treatments / Results  Labs (all labs ordered are listed, but only abnormal results are displayed) Labs Reviewed - No data to display  EKG  EKG Interpretation None       Radiology No results found.  Procedures Procedures (including critical care time)  Medications Ordered in ED Medications - No data to display   Initial Impression / Assessment and Plan / ED Course  I have reviewed the triage vital signs and the nursing notes.  Pertinent labs & imaging results that were available during my care of the patient were reviewed by me and considered in my medical decision making (see chart for details).  Clinical Course     Suture lines cleaned and redressed with Steri-Strips and gauze dressing.  Patient has been instructed to keep this in place for 24 hours.  He is to call Dr. early in the morning to discuss tonight complication  Final Clinical Impressions(s) / ED Diagnoses   Final diagnoses:  Bleeding from dialysis shunt, initial encounter [facility])    New Prescriptions New Prescriptions   No medications on file     Junius Creamer, NP 03/27/16 [place], DO 03/27/16 [id number]

## 2016-03-28 ENCOUNTER — Telehealth: Payer: Self-pay | Admitting: Vascular Surgery

## 2016-03-28 NOTE — Telephone Encounter (Signed)
-----   Message from Mena Goes, RN sent at 03/27/2016  9:37 AM EDT ----- Regarding: schedule   ----- Message ----- From: Alvia Grove, PA-C Sent: 03/26/2016  11:06 AM To: Vvs Charge Pool  S/p right BVT 03/26/16  F/u in 4 weeks with Dr. Donnetta Hutching and duplex  Thanks Maudie Mercury

## 2016-03-28 NOTE — Telephone Encounter (Signed)
Sched lab 9/11 at 4:00 and MD 9/13 at 1:30. Hm# has no vm, lm on cell# to inform pt.

## 2016-04-17 ENCOUNTER — Other Ambulatory Visit: Payer: Self-pay | Admitting: *Deleted

## 2016-04-17 ENCOUNTER — Encounter: Payer: Self-pay | Admitting: Vascular Surgery

## 2016-04-17 DIAGNOSIS — N186 End stage renal disease: Secondary | ICD-10-CM

## 2016-04-21 ENCOUNTER — Ambulatory Visit (HOSPITAL_COMMUNITY)
Admission: RE | Admit: 2016-04-21 | Discharge: 2016-04-21 | Disposition: A | Discharge status: Home / Self Care | Payer: Medicare Other | Source: Ambulatory Visit | Attending: Vascular Surgery | Admitting: Vascular Surgery

## 2016-04-21 ENCOUNTER — Inpatient Hospital Stay (HOSPITAL_COMMUNITY): Admission: RE | Admit: 2016-04-21 | Payer: Medicare Other | Source: Ambulatory Visit

## 2016-04-21 DIAGNOSIS — I82890 Acute embolism and thrombosis of other specified veins: Secondary | ICD-10-CM | POA: Insufficient documentation

## 2016-04-21 DIAGNOSIS — N186 End stage renal disease: Secondary | ICD-10-CM | POA: Diagnosis present

## 2016-04-22 ENCOUNTER — Ambulatory Visit (INDEPENDENT_AMBULATORY_CARE_PROVIDER_SITE_OTHER): Payer: Medicare Other | Admitting: *Deleted

## 2016-04-22 DIAGNOSIS — I429 Cardiomyopathy, unspecified: Secondary | ICD-10-CM

## 2016-04-22 DIAGNOSIS — I428 Other cardiomyopathies: Secondary | ICD-10-CM

## 2016-04-22 NOTE — Progress Notes (Signed)
Remote ICD transmission.   

## 2016-04-23 ENCOUNTER — Encounter: Payer: Self-pay | Admitting: Vascular Surgery

## 2016-04-23 ENCOUNTER — Ambulatory Visit (INDEPENDENT_AMBULATORY_CARE_PROVIDER_SITE_OTHER): Payer: Medicare Other | Admitting: Vascular Surgery

## 2016-04-23 ENCOUNTER — Other Ambulatory Visit: Payer: Self-pay

## 2016-04-23 VITALS — BP 107/75 | HR 97 | Ht 68.0 in | Wt 188.0 lb

## 2016-04-23 DIAGNOSIS — Z992 Dependence on renal dialysis: Secondary | ICD-10-CM

## 2016-04-23 DIAGNOSIS — N186 End stage renal disease: Secondary | ICD-10-CM

## 2016-04-23 NOTE — Progress Notes (Signed)
   Patient name: Timothy Faulkner MRN: [id number] DOB: August 21, 1945 Sex: male  REASON FOR VISIT: Follow-up recent right basilic vein transposition on 03/26/2016  HPI: Timothy Faulkner is a 70 y.o. male here today for follow-up. Had the new right basilic vein transposition on 03/26/2016. He is dialyzing via a right IJ catheter with no difficulty currently  Current Outpatient Prescriptions  Medication Sig Dispense Refill  . aspirin EC 81 MG tablet Take 81 mg by mouth daily.    . carvedilol (COREG) 3.125 MG tablet Take 1 tablet (3.125 mg total) by mouth 2 (two) times daily. 180 tablet 3  . oxyCODONE-acetaminophen (ROXICET) 5-325 MG tablet Take 1-2 tablets by mouth every 6 (six) hours as needed for severe pain. (Patient not taking: Reported on 04/23/2016) 20 tablet 0   No current facility-administered medications for this visit.      PHYSICAL EXAM: Vitals:   04/23/16 1322  BP: 107/75  Pulse: 97  SpO2: 97%  Weight: 188 lb (85.3 kg)  Height: 5\' 8"  (1.727 m)    GENERAL: The patient is a well-nourished male, in no acute distress. The vital signs are documented above. Right arm incisions are healed. His fistula is obviously clotted by physical exam with no thrill or no pulsation  He did undergo duplex which confirmed occlusion of his right upper arm AV basilic vein transposition fistula  MEDICAL ISSUES: Discuss options for next access. He does have an AICD on the left but has had no history of swelling. He has a very large cephalic vein which is superficial and straight on his left forearm. I imaged this with SonoSite ultrasound was patent throughout its course extending into the upper arm cephalic vein. I have recommended a left radiocephalic fistula. Splane the slight risk of arm swelling if he has a central venous occlusion related to this. Explained that he might require ligation if this occurred. Other option would be prosthetic graft in his right arm which  would prefer avoiding if at all possible. He understands we will schedule this as an outpatient in his or left radiocephalic fistula   [name], MD [facility] Special[facility] Tel [phone] Pager [phone]

## 2016-04-24 ENCOUNTER — Encounter: Payer: Self-pay | Admitting: Cardiology

## 2016-05-04 ENCOUNTER — Encounter (HOSPITAL_COMMUNITY): Payer: Self-pay | Admitting: *Deleted

## 2016-05-04 ENCOUNTER — Emergency Department (HOSPITAL_COMMUNITY)
Admission: EM | Admit: 2016-05-04 | Discharge: 2016-05-04 | Disposition: A | Discharge status: Home / Self Care | Payer: Medicare Other | Attending: Dermatology | Admitting: Dermatology

## 2016-05-04 DIAGNOSIS — N186 End stage renal disease: Secondary | ICD-10-CM | POA: Diagnosis not present

## 2016-05-04 DIAGNOSIS — T8249XA Other complication of vascular dialysis catheter, initial encounter: Secondary | ICD-10-CM | POA: Insufficient documentation

## 2016-05-04 DIAGNOSIS — E11621 Type 2 diabetes mellitus with foot ulcer: Secondary | ICD-10-CM | POA: Insufficient documentation

## 2016-05-04 DIAGNOSIS — I132 Hypertensive heart and chronic kidney disease with heart failure and with stage 5 chronic kidney disease, or end stage renal disease: Secondary | ICD-10-CM | POA: Insufficient documentation

## 2016-05-04 DIAGNOSIS — L97529 Non-pressure chronic ulcer of other part of left foot with unspecified severity: Secondary | ICD-10-CM | POA: Diagnosis not present

## 2016-05-04 DIAGNOSIS — Y731 Therapeutic (nonsurgical) and rehabilitative gastroenterology and urology devices associated with adverse incidents: Secondary | ICD-10-CM | POA: Diagnosis not present

## 2016-05-04 DIAGNOSIS — Z7982 Long term (current) use of aspirin: Secondary | ICD-10-CM | POA: Diagnosis not present

## 2016-05-04 DIAGNOSIS — Z5321 Procedure and treatment not carried out due to patient leaving prior to being seen by health care provider: Secondary | ICD-10-CM | POA: Diagnosis not present

## 2016-05-04 DIAGNOSIS — Z87891 Personal history of nicotine dependence: Secondary | ICD-10-CM | POA: Diagnosis not present

## 2016-05-04 DIAGNOSIS — Z992 Dependence on renal dialysis: Secondary | ICD-10-CM | POA: Diagnosis not present

## 2016-05-04 DIAGNOSIS — E1122 Type 2 diabetes mellitus with diabetic chronic kidney disease: Secondary | ICD-10-CM | POA: Insufficient documentation

## 2016-05-04 DIAGNOSIS — I509 Heart failure, unspecified: Secondary | ICD-10-CM | POA: Insufficient documentation

## 2016-05-04 NOTE — ED Triage Notes (Signed)
Pt has dialysis cath right chest. Reports he was sitting down watching tv and the cath completely fell out of his chest. Has it in a bag with him. No bleeding noted at site. Last dialysis treatment was Friday. Pt is scheduled for fistula surgery on wed.

## 2016-05-04 NOTE — ED Notes (Addendum)
Spoke with dr Melvia Heaps while pt in triage. He said pt does not need to be admitted to the ED. Pt can go home and will be scheduled for vascular procedure in am at dr office. Pt in no acute distress and vss. No bleeding noted from site, has clean dressing on it.

## 2016-05-06 ENCOUNTER — Encounter (HOSPITAL_COMMUNITY): Payer: Self-pay | Admitting: *Deleted

## 2016-05-06 LAB — CUP PACEART REMOTE DEVICE CHECK
Battery Remaining Percentage: 96 %
Brady Statistic RV Percent Paced: 0 %
HIGH POWER IMPEDANCE MEASURED VALUE: 96 Ohm
Implantable Lead Model: 181
Lead Channel Impedance Value: 584 Ohm
Lead Channel Setting Pacing Amplitude: 2.4 V
Lead Channel Setting Pacing Pulse Width: 0.5 ms
Lead Channel Setting Sensing Sensitivity: 0.5 mV
MDC IDC LEAD IMPLANT DT: 20111118
MDC IDC LEAD LOCATION: 753860
MDC IDC LEAD SERIAL: 313758
MDC IDC MSMT BATTERY REMAINING LONGEVITY: 90 mo
MDC IDC MSMT LEADCHNL RV PACING THRESHOLD AMPLITUDE: 0.8 V
MDC IDC MSMT LEADCHNL RV PACING THRESHOLD PULSEWIDTH: 0.5 ms
MDC IDC SESS DTM: 20170912054200
Pulse Gen Serial Number: 268731

## 2016-05-06 NOTE — Progress Notes (Signed)
Pt denies any acute cardiopulmonary issues. Pt is under the care of Dr. Cristopher Peru, Cardiology. Pt made aware to stop vitamins, fish oil, herbal medications and NSAID's. Pt verbalized understanding of all pre-op instructions.

## 2016-05-07 ENCOUNTER — Telehealth: Payer: Self-pay | Admitting: Vascular Surgery

## 2016-05-07 ENCOUNTER — Encounter (HOSPITAL_COMMUNITY)
Admission: RE | Disposition: A | Discharge status: Home / Self Care | Payer: Self-pay | Source: Ambulatory Visit | Attending: Vascular Surgery

## 2016-05-07 ENCOUNTER — Ambulatory Visit (HOSPITAL_COMMUNITY): Payer: Medicare Other | Admitting: Vascular Surgery

## 2016-05-07 ENCOUNTER — Encounter (HOSPITAL_COMMUNITY): Payer: Self-pay | Admitting: *Deleted

## 2016-05-07 ENCOUNTER — Other Ambulatory Visit: Payer: Self-pay | Admitting: *Deleted

## 2016-05-07 ENCOUNTER — Ambulatory Visit (HOSPITAL_COMMUNITY)
Admission: RE | Admit: 2016-05-07 | Discharge: 2016-05-07 | Disposition: A | Discharge status: Home / Self Care | Payer: Medicare Other | Source: Ambulatory Visit | Attending: Vascular Surgery | Admitting: Vascular Surgery

## 2016-05-07 DIAGNOSIS — J449 Chronic obstructive pulmonary disease, unspecified: Secondary | ICD-10-CM | POA: Insufficient documentation

## 2016-05-07 DIAGNOSIS — Z79899 Other long term (current) drug therapy: Secondary | ICD-10-CM | POA: Insufficient documentation

## 2016-05-07 DIAGNOSIS — Z4931 Encounter for adequacy testing for hemodialysis: Secondary | ICD-10-CM

## 2016-05-07 DIAGNOSIS — I12 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 chronic kidney disease or end stage renal disease: Secondary | ICD-10-CM | POA: Diagnosis not present

## 2016-05-07 DIAGNOSIS — Z992 Dependence on renal dialysis: Secondary | ICD-10-CM | POA: Insufficient documentation

## 2016-05-07 DIAGNOSIS — N185 Chronic kidney disease, stage 5: Secondary | ICD-10-CM | POA: Diagnosis not present

## 2016-05-07 DIAGNOSIS — E1122 Type 2 diabetes mellitus with diabetic chronic kidney disease: Secondary | ICD-10-CM | POA: Insufficient documentation

## 2016-05-07 DIAGNOSIS — N186 End stage renal disease: Secondary | ICD-10-CM | POA: Diagnosis present

## 2016-05-07 DIAGNOSIS — Z87891 Personal history of nicotine dependence: Secondary | ICD-10-CM | POA: Diagnosis not present

## 2016-05-07 DIAGNOSIS — E1151 Type 2 diabetes mellitus with diabetic peripheral angiopathy without gangrene: Secondary | ICD-10-CM | POA: Insufficient documentation

## 2016-05-07 DIAGNOSIS — Z95810 Presence of automatic (implantable) cardiac defibrillator: Secondary | ICD-10-CM | POA: Insufficient documentation

## 2016-05-07 DIAGNOSIS — Z7982 Long term (current) use of aspirin: Secondary | ICD-10-CM | POA: Insufficient documentation

## 2016-05-07 HISTORY — PX: AV FISTULA PLACEMENT: SHX1204

## 2016-05-07 LAB — POCT I-STAT 4, (NA,K, GLUC, HGB,HCT)
GLUCOSE: 85 mg/dL (ref 65–99)
HEMATOCRIT: 42 % (ref 39.0–52.0)
HEMOGLOBIN: 14.3 g/dL (ref 13.0–17.0)
POTASSIUM: 4.2 mmol/L (ref 3.5–5.1)
SODIUM: 139 mmol/L (ref 135–145)

## 2016-05-07 LAB — GLUCOSE, CAPILLARY
Glucose-Capillary: 103 mg/dL — ABNORMAL HIGH (ref 65–99)
Glucose-Capillary: 63 mg/dL — ABNORMAL LOW (ref 65–99)

## 2016-05-07 SURGERY — ARTERIOVENOUS (AV) FISTULA CREATION
Anesthesia: Monitor Anesthesia Care | Site: Arm Lower | Laterality: Left

## 2016-05-07 MED ORDER — FENTANYL CITRATE (PF) 250 MCG/5ML IJ SOLN
INTRAMUSCULAR | Status: AC
  Filled 2016-05-07: qty 5

## 2016-05-07 MED ORDER — DEXTROSE 5 % IV SOLN
1.5000 g | INTRAVENOUS | Status: AC
  Administered 2016-05-07: 1.5 g via INTRAVENOUS
  Filled 2016-05-07: qty 1.5

## 2016-05-07 MED ORDER — 0.9 % SODIUM CHLORIDE (POUR BTL) OPTIME
TOPICAL | Status: DC | PRN
  Administered 2016-05-07: 1000 mL

## 2016-05-07 MED ORDER — OXYCODONE-ACETAMINOPHEN 5-325 MG PO TABS: 1.0000 | ORAL_TABLET | Freq: Four times a day (QID) | ORAL | 0 refills | Status: DC | PRN

## 2016-05-07 MED ORDER — PROPOFOL 500 MG/50ML IV EMUL
INTRAVENOUS | Status: AC
  Filled 2016-05-07: qty 150

## 2016-05-07 MED ORDER — MIDAZOLAM HCL 5 MG/5ML IJ SOLN
INTRAMUSCULAR | Status: DC | PRN
  Administered 2016-05-07: 1 mg via INTRAVENOUS

## 2016-05-07 MED ORDER — MIDAZOLAM HCL 2 MG/2ML IJ SOLN
INTRAMUSCULAR | Status: AC
  Filled 2016-05-07: qty 2

## 2016-05-07 MED ORDER — LIDOCAINE-EPINEPHRINE (PF) 1 %-1:200000 IJ SOLN
INTRAMUSCULAR | Status: AC
  Filled 2016-05-07: qty 30

## 2016-05-07 MED ORDER — PROPOFOL 10 MG/ML IV BOLUS
INTRAVENOUS | Status: AC
  Filled 2016-05-07: qty 40

## 2016-05-07 MED ORDER — LIDOCAINE HCL (CARDIAC) 20 MG/ML IV SOLN
INTRAVENOUS | Status: DC | PRN
  Administered 2016-05-07 (×2): 30 mg via INTRAVENOUS

## 2016-05-07 MED ORDER — SODIUM CHLORIDE 0.9 % IV SOLN
INTRAVENOUS | Status: DC
  Administered 2016-05-07 (×2): via INTRAVENOUS

## 2016-05-07 MED ORDER — CHLORHEXIDINE GLUCONATE CLOTH 2 % EX PADS: 6.0000 | MEDICATED_PAD | Freq: Once | CUTANEOUS | Status: DC

## 2016-05-07 MED ORDER — FENTANYL CITRATE (PF) 100 MCG/2ML IJ SOLN
INTRAMUSCULAR | Status: DC | PRN
  Administered 2016-05-07: 50 ug via INTRAVENOUS

## 2016-05-07 MED ORDER — SODIUM CHLORIDE 0.9 % IV SOLN
INTRAVENOUS | Status: DC | PRN
  Administered 2016-05-07: 500 mL

## 2016-05-07 MED ORDER — SODIUM CHLORIDE 0.9 % IV SOLN: INTRAVENOUS | Status: DC

## 2016-05-07 MED ORDER — PROPOFOL 1000 MG/100ML IV EMUL
INTRAVENOUS | Status: AC
  Filled 2016-05-07: qty 300

## 2016-05-07 MED ORDER — LIDOCAINE-EPINEPHRINE 0.5 %-1:200000 IJ SOLN
INTRAMUSCULAR | Status: DC | PRN
  Administered 2016-05-07: 5 mL

## 2016-05-07 SURGICAL SUPPLY — 41 items
APL SKNCLS STERI-STRIP NONHPOA (GAUZE/BANDAGES/DRESSINGS) ×1
ARMBAND PINK RESTRICT EXTREMIT (MISCELLANEOUS) ×6 IMPLANT
BENZOIN TINCTURE PRP APPL 2/3 (GAUZE/BANDAGES/DRESSINGS) ×3 IMPLANT
CANISTER SUCTION 2500CC (MISCELLANEOUS) ×3 IMPLANT
CANNULA VESSEL 3MM 2 BLNT TIP (CANNULA) ×3 IMPLANT
CLIP LIGATING EXTRA MED SLVR (CLIP) ×3 IMPLANT
CLIP LIGATING EXTRA SM BLUE (MISCELLANEOUS) ×3 IMPLANT
CLOSURE STERI-STRIP 1/2X4 (GAUZE/BANDAGES/DRESSINGS) ×1
CLOSURE WOUND 1/2 X4 (GAUZE/BANDAGES/DRESSINGS) ×1
CLSR STERI-STRIP ANTIMIC 1/2X4 (GAUZE/BANDAGES/DRESSINGS) ×1 IMPLANT
COVER PROBE W GEL 5X96 (DRAPES) ×3 IMPLANT
DECANTER SPIKE VIAL GLASS SM (MISCELLANEOUS) ×1 IMPLANT
ELECT REM PT RETURN 9FT ADLT (ELECTROSURGICAL) ×3
ELECTRODE REM PT RTRN 9FT ADLT (ELECTROSURGICAL) ×1 IMPLANT
GAUZE SPONGE 4X4 12PLY STRL (GAUZE/BANDAGES/DRESSINGS) ×3 IMPLANT
GEL ULTRASOUND 20GR AQUASONIC (MISCELLANEOUS) IMPLANT
GLOVE BIOGEL PI IND STRL 6.5 (GLOVE) IMPLANT
GLOVE BIOGEL PI IND STRL 7.0 (GLOVE) IMPLANT
GLOVE BIOGEL PI IND STRL 7.5 (GLOVE) IMPLANT
GLOVE BIOGEL PI INDICATOR 6.5 (GLOVE) ×2
GLOVE BIOGEL PI INDICATOR 7.0 (GLOVE) ×4
GLOVE BIOGEL PI INDICATOR 7.5 (GLOVE) ×2
GLOVE ECLIPSE 6.5 STRL STRAW (GLOVE) ×4 IMPLANT
GLOVE ECLIPSE 7.0 STRL STRAW (GLOVE) ×2 IMPLANT
GLOVE SS BIOGEL STRL SZ 7.5 (GLOVE) ×1 IMPLANT
GLOVE SUPERSENSE BIOGEL SZ 7.5 (GLOVE) ×2
GOWN STRL REUS W/ TWL LRG LVL3 (GOWN DISPOSABLE) ×3 IMPLANT
GOWN STRL REUS W/TWL LRG LVL3 (GOWN DISPOSABLE) ×9
KIT BASIN OR (CUSTOM PROCEDURE TRAY) ×3 IMPLANT
KIT ROOM TURNOVER OR (KITS) ×3 IMPLANT
NS IRRIG 1000ML POUR BTL (IV SOLUTION) ×3 IMPLANT
PACK CV ACCESS (CUSTOM PROCEDURE TRAY) ×3 IMPLANT
PAD ARMBOARD 7.5X6 YLW CONV (MISCELLANEOUS) ×6 IMPLANT
SPONGE GAUZE 4X4 12PLY STER LF (GAUZE/BANDAGES/DRESSINGS) ×2 IMPLANT
STRIP CLOSURE SKIN 1/2X4 (GAUZE/BANDAGES/DRESSINGS) ×2 IMPLANT
SUT PROLENE 6 0 CC (SUTURE) ×3 IMPLANT
SUT VIC AB 3-0 SH 27 (SUTURE) ×3
SUT VIC AB 3-0 SH 27X BRD (SUTURE) ×1 IMPLANT
TAPE CLOTH SURG 4X10 WHT LF (GAUZE/BANDAGES/DRESSINGS) ×2 IMPLANT
UNDERPAD 30X30 (UNDERPADS AND DIAPERS) ×3 IMPLANT
WATER STERILE IRR 1000ML POUR (IV SOLUTION) ×3 IMPLANT

## 2016-05-07 NOTE — Op Note (Signed)
    OPERATIVE REPORT  DATE OF SURGERY: 05/07/2016  PATIENT: Timothy Faulkner, 70 y.o. male MRN: [id number]  DOB: 11/28/1945  PRE-OPERATIVE DIAGNOSIS: Chronic renal insufficiency  POST-OPERATIVE DIAGNOSIS:  Same  PROCEDURE: Left radiocephalic AV fistula  SURGEON:  Curt Jews, M.D.  PHYSICIAN ASSISTANT: Dyanne Iha PA-C  ANESTHESIA:  Local with sedation  EBL: Minimal ml  Total I/O In: 550 [I.V.:550] Out: 25 [Blood:25]  BLOOD ADMINISTERED: None  DRAINS: None  SPECIMEN: None  COUNTS CORRECT:  YES  PLAN OF CARE: PACU stable   PATIENT DISPOSITION:  PACU - hemodynamically stable  PROCEDURE DETAILS: Patient was taken up room placed supine position the area of the left arm prepped in sterile fashion incision was made to the level of the cephalic vein and the radial artery at the wrist. The cephalic vein was mobilized proximally and distally with return branches being ligated with 4-0 silk ties and divided. The vein was ligated distally and divided. The radial artery was exposed through the same incision. The artery was of normal caliber did have significant atherosclerotic change. The artery was controlled proximally and distally with red Vesseloops. The artery was occluded proximally and distally was opened with 11 blade some mulch any with Potts scissors. The vein was brought into approximation with the artery and was cut to appropriate length and spatulated and sewn end-to-side to the artery with a running 6-0 Prolene suture. Clamps were removed and good flow was noted through the vein. The vein was very nice caliber throughout the forearm. Did branch in the upper arm with the majority flow going to the more lateral branch. The wound irrigated with saline. Hemostasis tablet cautery. Wound was closed with 3-0 Vicryl in the subcutaneous and subcuticular tissue. Benzoin Steri-Strips were applied the patient was transferred to the recovery room in stable condition   [name],  M.D., [facility] 05/07/2016 2:28 PM

## 2016-05-07 NOTE — Anesthesia Postprocedure Evaluation (Signed)
Anesthesia Post Note  Patient: Timothy Faulkner  Procedure(s) Performed: Procedure(s) (LRB): LEFT RADIOCEPHALIC ARTERIOVENOUS (AV) FISTULA CREATION (Left)  Patient location during evaluation: PACU Anesthesia Type: MAC Level of consciousness: awake and alert Pain management: pain level controlled Vital Signs Assessment: post-procedure vital signs reviewed and stable Respiratory status: spontaneous breathing, nonlabored ventilation, respiratory function stable and patient connected to nasal cannula oxygen Cardiovascular status: stable and blood pressure returned to baseline Anesthetic complications: no    Last Vitals:  Vitals:   05/07/16 1230 05/07/16 1235  BP:  (!) 152/97  Pulse: 71 69  Resp: 11 19  Temp:      Last Pain:  Vitals:   05/07/16 0857  TempSrc: Oral                 Reginal Lutes

## 2016-05-07 NOTE — H&P (View-Only) (Signed)
   Patient name: Timothy Faulkner MRN: [id number] DOB: [birth year] Sex: male  REASON FOR VISIT: Follow-up recent right basilic vein transposition on 03/26/2016  HPI: Timothy Faulkner is a 70 y.o. male here today for follow-up. Had the new right basilic vein transposition on 03/26/2016. He is dialyzing via a right IJ catheter with no difficulty currently  Current Outpatient Prescriptions  Medication Sig Dispense Refill  . aspirin EC 81 MG tablet Take 81 mg by mouth daily.    . carvedilol (COREG) 3.125 MG tablet Take 1 tablet (3.125 mg total) by mouth 2 (two) times daily. 180 tablet 3  . oxyCODONE-acetaminophen (ROXICET) 5-325 MG tablet Take 1-2 tablets by mouth every 6 (six) hours as needed for severe pain. (Patient not taking: Reported on 04/23/2016) 20 tablet 0   No current facility-administered medications for this visit.      PHYSICAL EXAM: Vitals:   04/23/16 1322  BP: 107/75  Pulse: 97  SpO2: 97%  Weight: 188 lb (85.3 kg)  Height: 5\' 8"  (1.727 m)    GENERAL: The patient is a well-nourished male, in no acute distress. The vital signs are documented above. Right arm incisions are healed. His fistula is obviously clotted by physical exam with no thrill or no pulsation  He did undergo duplex which confirmed occlusion of his right upper arm AV basilic vein transposition fistula  MEDICAL ISSUES: Discuss options for next access. He does have an AICD on the left but has had no history of swelling. He has a very large cephalic vein which is superficial and straight on his left forearm. I imaged this with SonoSite ultrasound was patent throughout its course extending into the upper arm cephalic vein. I have recommended a left radiocephalic fistula. Splane the slight risk of arm swelling if he has a central venous occlusion related to this. Explained that he might require ligation if this occurred. Other option would be prosthetic graft in his right arm which  would prefer avoiding if at all possible. He understands we will schedule this as an outpatient in his or left radiocephalic fistula   [name], MD [facility] Tel [phone] Pager [phone]

## 2016-05-07 NOTE — Transfer of Care (Signed)
Immediate Anesthesia Transfer of Care Note  Patient: Timothy Faulkner  Procedure(s) Performed: Procedure(s): LEFT RADIOCEPHALIC ARTERIOVENOUS (AV) FISTULA CREATION (Left)  Patient Location: PACU  Anesthesia Type:MAC  Level of Consciousness: awake, alert , oriented and sedated  Airway & Oxygen Therapy: Patient Spontanous Breathing and Patient connected to nasal cannula oxygen  Post-op Assessment: Report given to RN, Post -op Vital signs reviewed and stable and Patient moving all extremities  Post vital signs: Reviewed and stable  Last Vitals:  Vitals:   05/07/16 0857 05/07/16 1206  BP: (!) 103/56   Pulse: 73   Resp: 18   Temp: 36.9 C (P) 36.5 C    Last Pain:  Vitals:   05/07/16 0857  TempSrc: Oral      Patients Stated Pain Goal: 3 (34/96/11 6435)  Complications: No apparent anesthesia complications

## 2016-05-07 NOTE — Anesthesia Preprocedure Evaluation (Addendum)
Anesthesia Evaluation  Patient identified by MRN, date of birth, ID band Patient awake    Reviewed: Allergy & Precautions, NPO status , Patient's Chart, lab work & pertinent test results, reviewed documented beta blocker date and time   History of Anesthesia Complications Negative for: history of anesthetic complications  Airway Mallampati: II  TM Distance: >3 FB Neck ROM: Full    Dental  (+) Edentulous Upper, Poor Dentition, Missing, Dental Advisory Given   Pulmonary COPD, former smoker,    breath sounds clear to auscultation       Cardiovascular hypertension, Pt. on medications and Pt. on home beta blockers (-) angina+ Peripheral Vascular Disease  + pacemaker + Cardiac Defibrillator  Rhythm:Regular Rate:Normal  '14 ECHO: EF 20-25%, mild MR, mod TR '14 stress: no ischemia, EF 28%   Neuro/Psych negative neurological ROS     GI/Hepatic Neg liver ROS, GERD  Poorly Controlled,  Endo/Other  diabetes  Renal/GU ESRF and DialysisRenal disease (MWF, K+ 4.4)     Musculoskeletal   Abdominal (+) - obese,   Peds  Hematology negative hematology ROS (+)   Anesthesia Other Findings   Reproductive/Obstetrics                             Anesthesia Physical  Anesthesia Plan  ASA: III  Anesthesia Plan: MAC   Post-op Pain Management:    Induction: Intravenous  Airway Management Planned: Simple Face Mask  Additional Equipment:   Intra-op Plan:   Post-operative Plan:   Informed Consent: I have reviewed the patients History and Physical, chart, labs and discussed the procedure including the risks, benefits and alternatives for the proposed anesthesia with the patient or authorized representative who has indicated his/her understanding and acceptance.   Dental advisory given  Plan Discussed with: CRNA and Surgeon  Anesthesia Plan Comments:        Anesthesia Quick Evaluation

## 2016-05-07 NOTE — Telephone Encounter (Signed)
Spoke to pt on mobile number for appt on 10/31 with [place] starting at 2:30

## 2016-05-07 NOTE — Telephone Encounter (Signed)
-----   Message from Mena Goes, RN sent at 05/07/2016 12:34 PM EDT ----- Regarding: postop w/ lab   ----- Message ----- From: Ulyses Amor, PA-C Sent: 05/07/2016  11:49 AM To: V[facility] Charge Pool  Left AV fistula creation f/u in 4 weeks with duplex.  Dr. Donnetta Hutching

## 2016-05-07 NOTE — Interval H&P Note (Signed)
History and Physical Interval Note:  05/07/2016 9:51 AM  Timothy Faulkner  has presented today for surgery, with the diagnosis of End Stage Renal Disease N18.6  The various methods of treatment have been discussed with the patient and family. After consideration of risks, benefits and other options for treatment, the patient has consented to  Procedure(s): RADIOCEPHALIC ARTERIOVENOUS (AV) FISTULA CREATION (Left) as a surgical intervention .  The patient's history has been reviewed, patient examined, no change in status, stable for surgery.  I have reviewed the patient's chart and labs.  Questions were answered to the patient's satisfaction.     Curt Jews

## 2016-05-08 ENCOUNTER — Encounter (HOSPITAL_COMMUNITY): Payer: Self-pay | Admitting: Vascular Surgery

## 2016-06-05 ENCOUNTER — Encounter: Payer: Self-pay | Admitting: Vascular Surgery

## 2016-06-10 ENCOUNTER — Ambulatory Visit (HOSPITAL_COMMUNITY)
Admission: RE | Admit: 2016-06-10 | Discharge: 2016-06-10 | Disposition: A | Discharge status: Home / Self Care | Payer: Medicare Other | Source: Ambulatory Visit | Attending: Vascular Surgery | Admitting: Vascular Surgery

## 2016-06-10 ENCOUNTER — Ambulatory Visit (INDEPENDENT_AMBULATORY_CARE_PROVIDER_SITE_OTHER): Payer: Medicare Other | Admitting: Vascular Surgery

## 2016-06-10 ENCOUNTER — Encounter: Payer: Self-pay | Admitting: Vascular Surgery

## 2016-06-10 VITALS — BP 149/92 | HR 79 | Temp 98.2°F | Resp 18 | Ht 68.0 in | Wt 190.0 lb

## 2016-06-10 DIAGNOSIS — Z992 Dependence on renal dialysis: Secondary | ICD-10-CM

## 2016-06-10 DIAGNOSIS — N186 End stage renal disease: Secondary | ICD-10-CM | POA: Diagnosis present

## 2016-06-10 DIAGNOSIS — Z4931 Encounter for adequacy testing for hemodialysis: Secondary | ICD-10-CM | POA: Diagnosis not present

## 2016-06-10 NOTE — Progress Notes (Signed)
  POST OPERATIVE OFFICE NOTE    CC:  F/u for surgery  HPI:  This is a 70 y.o. male who is s/p left radial cephalic AV fistula on 02/16/61 by Dr. Donnetta Hutching.  He returns today for follow up.  He states he is doing well and denies any steal symptoms.  He is currently dialyzing via a TDC.    Allergies  Allergen Reactions  . No Known Allergies     Current Outpatient Prescriptions  Medication Sig Dispense Refill  . aspirin EC 81 MG tablet Take 81 mg by mouth daily.    . carvedilol (COREG) 3.125 MG tablet Take 1 tablet (3.125 mg total) by mouth 2 (two) times daily. 180 tablet 3  . oxyCODONE-acetaminophen (PERCOCET/ROXICET) 5-325 MG tablet Take 1 tablet by mouth every 6 (six) hours as needed. 6 tablet 0  . SENSIPAR 30 MG tablet Take 30 mg by mouth daily.     No current facility-administered medications for this visit.      ROS:  See HPI  Physical Exam:  Vitals:   06/10/16 1552 06/10/16 1555  BP: (!) 145/95 (!) 149/92  Pulse: 79   Resp: 18   Temp: 98.2 F (36.8 C)     Incision:  Well healed Extremities:  +thrill/bruit within the fistula; visible side branch laterally   Assessment/Plan:  This is a 70 y.o. male who is s/p:left  Radial cephalic AVF on 8/36/62 by Dr. Donnetta Hutching  -pt's fistula is patent and maturing nicely.  Dr. Donnetta Hutching looked at the fistula with the u/s.  He does have one competing side branch that is proximal/lateral.  Will monitor this as it potentially could need ligating in the future. -may start to stick the fistula on 08/06/16.   After a couple of successful HD treatments, TDC may be removed.     Leontine Locket, PA-C [facility] [phone]  Clinic MD:  Pt seen and examined with Dr. Donnetta Hutching

## 2016-06-10 NOTE — Progress Notes (Signed)
Vitals:   06/10/16 1552 06/10/16 1555  BP: (!) 145/95 (!) 149/92  Pulse: 79   Resp: 18   Temp: 98.2 F (36.8 C)   TempSrc: Oral   SpO2: 99%   Weight: 190 lb (86.2 kg)   Height: 5\' 8"  (1.727 m)

## 2016-07-22 ENCOUNTER — Ambulatory Visit (INDEPENDENT_AMBULATORY_CARE_PROVIDER_SITE_OTHER): Payer: Medicare Other | Admitting: *Deleted

## 2016-07-22 DIAGNOSIS — I428 Other cardiomyopathies: Secondary | ICD-10-CM | POA: Diagnosis not present

## 2016-07-22 NOTE — Progress Notes (Signed)
Remote ICD transmission.   

## 2016-07-30 ENCOUNTER — Encounter: Payer: Self-pay | Admitting: Cardiology

## 2016-08-18 LAB — CUP PACEART REMOTE DEVICE CHECK
Battery Remaining Percentage: 93 %
Date Time Interrogation Session: 20171212063100
HIGH POWER IMPEDANCE MEASURED VALUE: 98 Ohm
Implantable Lead Serial Number: 313758
Implantable Pulse Generator Implant Date: 20111118
Lead Channel Impedance Value: 635 Ohm
Lead Channel Pacing Threshold Pulse Width: 0.5 ms
Lead Channel Setting Pacing Amplitude: 2.4 V
Lead Channel Setting Pacing Pulse Width: 0.5 ms
MDC IDC LEAD IMPLANT DT: 20111118
MDC IDC LEAD LOCATION: 753860
MDC IDC LEAD MODEL: 181
MDC IDC MSMT BATTERY REMAINING LONGEVITY: 90 mo
MDC IDC MSMT LEADCHNL RV PACING THRESHOLD AMPLITUDE: 0.8 V
MDC IDC SET LEADCHNL RV SENSING SENSITIVITY: 0.5 mV
MDC IDC STAT BRADY RV PERCENT PACED: 0 %
Pulse Gen Serial Number: 268731

## 2016-09-16 ENCOUNTER — Ambulatory Visit (INDEPENDENT_AMBULATORY_CARE_PROVIDER_SITE_OTHER): Payer: Medicare Other | Admitting: Vascular Surgery

## 2016-09-16 ENCOUNTER — Encounter: Payer: Self-pay | Admitting: Vascular Surgery

## 2016-09-16 VITALS — BP 152/93 | HR 78 | Temp 97.6°F | Resp 18 | Ht 68.0 in | Wt 196.0 lb

## 2016-09-16 DIAGNOSIS — N186 End stage renal disease: Secondary | ICD-10-CM | POA: Diagnosis not present

## 2016-09-16 DIAGNOSIS — Z992 Dependence on renal dialysis: Secondary | ICD-10-CM

## 2016-09-16 NOTE — Progress Notes (Signed)
[facility] [place]  Patient name: Timothy Faulkner MRN: [id number] DOB: 12/26/1945 Sex: male  REASON FOR VISIT: Follow-up left radiocephalic fistula  HPI: Timothy Faulkner is a 71 y.o. male is now being dialyzed via left radiocephalic fistula. He reports that he is having difficulty with this. He reports that the some of the technicians are having difficulty axis was this and it looks like he has had an infiltration in the upper portion of this. There is also an eschar present over the lower portion which is somewhat unusual since he has not been using this fistula more than one month. He did have a recent fistulogram at [facility] and I have reviewed these actual images. This does show a small inflow artery but no evidence of stenosis at the artery to vein anastomosis. The vein is patent throughout its course.  Past Medical History:  Diagnosis Date  . Anemia   . Arthritis    Gout  . Automatic implantable cardioverter-defibrillator in situ    [employer]  . CHF   . DIAB W/O COMP TYPE II/UNS NOT STATED UNCNTRL    No medication  . ESRD on dialysis [facility])    [name] 03/11/2013 (03/11/2013) dialysis M/W/F  . ESRD on dialysis ([place])   . GERD (gastroesophageal reflux disease)   . Gout    "once a year"  . Heart murmur   . HYPERCHOLESTEROLEMIA, MIXED   . HYPERTENSION   . Macular degeneration   . 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 Pacemaker   . Peripheral arterial disease (HCC)    left fifth toe ulcer, healing  . Pneumonia   . Shortness of breath   . 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 Heart disease Father   . CAD Father     SOCIAL HISTORY: Social History  Substance Use Topics  . Smoking status: Former Smoker    Packs/day: 0.75    Years: 30.00    Types: Cigarettes    Quit date: 11/25/1992  . Smokeless tobacco:  Never Used  . Alcohol use No     Comment: have not had a drink 2012    Allergies  Allergen Reactions  . No Known Allergies     Current Outpatient Prescriptions  Medication Sig Dispense Refill  . aspirin EC 81 MG tablet Take 81 mg by mouth daily.    . carvedilol (COREG) 3.125 MG tablet Take 1 tablet (3.125 mg total) by mouth 2 (two) times daily. 180 tablet 3  . oxyCODONE-acetaminophen (PERCOCET/ROXICET) 5-325 MG tablet Take 1 tablet by mouth every 6 (six) hours as needed. 6 tablet 0  . SENSIPAR 30 MG tablet Take 30 mg by mouth daily.     No current facility-administered medications for this visit.     REVIEW OF SYSTEMS:  [X]  denotes positive finding, [ ]  denotes negative finding Cardiac  Comments:  Chest pain or chest pressure:    Shortness of breath upon exertion:    Short of breath when lying flat:    Irregular heart rhythm:        Vascular    Pain in calf, thigh, or hip brought on by ambulation:    Pain in feet at night that wakes you up from your sleep:     Blood clot in your veins:    Leg swelling:  PHYSICAL EXAM: Vitals:   09/16/16 1215 09/16/16 1218  BP: (!) 152/92 (!) 152/93  Pulse: 78   Resp: 18   Temp: 97.6 F (36.4 C)   TempSrc: Oral   SpO2: 100%   Weight: 196 lb (88.9 kg)   Height: 5\' 8"  (1.727 m)     GENERAL: The patient is a well-nourished male, in no acute distress. The vital signs are documented above. CARDIOVASCULAR: Good thrill in the fistula. Does have an eschar on the more distal access area towards his wrist. There is an infiltration in the upper forearm. PULMONARY: There is good air exchange  MUSCULOSKELETAL: There are no major deformities or cyanosis. NEUROLOGIC: No focal weakness or paresthesias are detected. SKIN: There are no ulcers or rashes noted. PSYCHIATRIC: The patient has a normal affect.  DATA:  I imaged these areas with SonoSite ultrasound. He does have a large branch that begins in the upper third of his forearm.  Otherwise no major competing branches  MEDICAL ISSUES: Had long discussion with patient. I do not see any correctable issues regarding his fistula. I feel that he would have very little benefit from ligating the more proximal competing branches since this would only give another 1-2 inches of access post to the antecubital space. The vein is of moderate size only. I have recommended that he continue use of the fistula. Explained that if this is proves unsatisfactory will need a catheter and new fistula placement. Does appear that he would be a candidate for either a left upper arm or basilic vein transposition fistula. He does have an AICD on the left but has no evidence that he is having any venous outflow issues.    [name], MD FACS [facility] Tel [phone] Pager [phone]

## 2016-12-23 ENCOUNTER — Encounter (HOSPITAL_COMMUNITY): Payer: Self-pay

## 2016-12-23 ENCOUNTER — Emergency Department (HOSPITAL_COMMUNITY)
Admission: EM | Admit: 2016-12-23 | Discharge: 2016-12-23 | Disposition: A | Discharge status: Home / Self Care | Payer: Medicare Other | Attending: Emergency Medicine | Admitting: Emergency Medicine

## 2016-12-23 ENCOUNTER — Ambulatory Visit (INDEPENDENT_AMBULATORY_CARE_PROVIDER_SITE_OTHER): Payer: Medicare Other | Admitting: *Deleted

## 2016-12-23 ENCOUNTER — Emergency Department (HOSPITAL_COMMUNITY): Payer: Medicare Other

## 2016-12-23 DIAGNOSIS — Z79899 Other long term (current) drug therapy: Secondary | ICD-10-CM | POA: Diagnosis not present

## 2016-12-23 DIAGNOSIS — R0789 Other chest pain: Secondary | ICD-10-CM | POA: Diagnosis present

## 2016-12-23 DIAGNOSIS — E1122 Type 2 diabetes mellitus with diabetic chronic kidney disease: Secondary | ICD-10-CM | POA: Insufficient documentation

## 2016-12-23 DIAGNOSIS — N186 End stage renal disease: Secondary | ICD-10-CM | POA: Insufficient documentation

## 2016-12-23 DIAGNOSIS — I132 Hypertensive heart and chronic kidney disease with heart failure and with stage 5 chronic kidney disease, or end stage renal disease: Secondary | ICD-10-CM | POA: Insufficient documentation

## 2016-12-23 DIAGNOSIS — Z7982 Long term (current) use of aspirin: Secondary | ICD-10-CM | POA: Diagnosis not present

## 2016-12-23 DIAGNOSIS — Z992 Dependence on renal dialysis: Secondary | ICD-10-CM | POA: Insufficient documentation

## 2016-12-23 DIAGNOSIS — Z87891 Personal history of nicotine dependence: Secondary | ICD-10-CM | POA: Insufficient documentation

## 2016-12-23 DIAGNOSIS — I5022 Chronic systolic (congestive) heart failure: Secondary | ICD-10-CM | POA: Insufficient documentation

## 2016-12-23 DIAGNOSIS — I428 Other cardiomyopathies: Secondary | ICD-10-CM | POA: Diagnosis not present

## 2016-12-23 DIAGNOSIS — R079 Chest pain, unspecified: Secondary | ICD-10-CM

## 2016-12-23 LAB — BASIC METABOLIC PANEL
Anion gap: 13 (ref 5–15)
BUN: 31 mg/dL — ABNORMAL HIGH (ref 6–20)
CALCIUM: 9.3 mg/dL (ref 8.9–10.3)
CO2: 28 mmol/L (ref 22–32)
CREATININE: 8.47 mg/dL — AB (ref 0.61–1.24)
Chloride: 98 mmol/L — ABNORMAL LOW (ref 101–111)
GFR, EST AFRICAN AMERICAN: 6 mL/min — AB (ref >60)
GFR, EST NON AFRICAN AMERICAN: 6 mL/min — AB (ref >60)
Glucose, Bld: 86 mg/dL (ref 65–99)
Potassium: 4 mmol/L (ref 3.5–5.1)
SODIUM: 139 mmol/L (ref 135–145)

## 2016-12-23 LAB — CBC
HCT: 43.2 % (ref 39.0–52.0)
Hemoglobin: 14.2 g/dL (ref 13.0–17.0)
MCH: 34.3 pg — ABNORMAL HIGH (ref 26.0–34.0)
MCHC: 32.9 g/dL (ref 30.0–36.0)
MCV: 104.3 fL — ABNORMAL HIGH (ref 78.0–100.0)
PLATELETS: 238 10*3/uL (ref 150–400)
RBC: 4.14 MIL/uL — AB (ref 4.22–5.81)
RDW: 15.2 % (ref 11.5–15.5)
WBC: 6.2 10*3/uL (ref 4.0–10.5)

## 2016-12-23 LAB — TROPONIN I
TROPONIN I: 0.07 ng/mL — AB (ref <0.03)
TROPONIN I: 0.07 ng/mL — AB (ref <0.03)

## 2016-12-23 MED ORDER — IOPAMIDOL (ISOVUE-370) INJECTION 76%
INTRAVENOUS | Status: AC
  Administered 2016-12-23: 100 mL
  Filled 2016-12-23: qty 100

## 2016-12-23 MED ORDER — HYDROCODONE-ACETAMINOPHEN 5-325 MG PO TABS
1.0000 | ORAL_TABLET | Freq: Once | ORAL | Status: AC
  Administered 2016-12-23: 1 via ORAL
  Filled 2016-12-23: qty 1

## 2016-12-23 NOTE — ED Notes (Signed)
Pt ambulatory at departure, NAD. Voices understanding of discharge instructions and follow up information. A/o x4. Family transporting patient home.

## 2016-12-23 NOTE — ED Triage Notes (Signed)
Pt states that tonight he started having sharp chest pain, R sided that shoot across his chest, denies SOB, or n/v. Pt is a dialysis pt, last received yesterday.

## 2016-12-23 NOTE — ED Notes (Signed)
Patient transported to X-ray 

## 2016-12-23 NOTE — Discharge Instructions (Signed)
Return to the ED with any concerns including worsening chest pain, difficulty breathing, fainting, or any other alarming symptoms

## 2016-12-23 NOTE — ED Provider Notes (Signed)
[facility] DEPT Provider Note   CSN: [id number] Arrival date & time: 12/23/16  3154     History   Chief Complaint Chief Complaint  Patient presents with  . Chest Pain    HPI ABIEL ANTRIM is a 71 y.o. male.  HPI  This is a 71 year old male who has a history of chronic kidney disease on dialysis Monday, Wednesday, and Friday, CHF, hypertension, hyperlipidemia who presents with chest pain. Patient reports 2 to three-day history of intermittent pleuritic chest pain. It is right-sided. It is nonradiating. It is worse with deep breaths and sometimes comes on all eye itself. Not exacerbated with activity. When it comes it is 8 out of 10. He has not taken anything for his symptoms. Denies any history of blood clots, recent travel, recent hospitalization. Denies any recent fevers or cough.  Past Medical History:  Diagnosis Date  . Anemia   . Arthritis    Gout  . Automatic implantable cardioverter-defibrillator in situ    [employer]  . CHF   . DIAB W/O COMP TYPE II/UNS NOT STATED UNCNTRL    No medication  . ESRD on dialysis [facility])    [name] 03/11/2013 (03/11/2013) dialysis M/W/F  . ESRD on dialysis ([place])   . GERD (gastroesophageal reflux disease)   . Gout    "once a year"  . Heart murmur   . HYPERCHOLESTEROLEMIA, MIXED   . HYPERTENSION   . Macular degeneration   . 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 Pacemaker   . Peripheral arterial disease (HCC)    left fifth toe ulcer, healing  . Pneumonia   . Shortness of breath   . 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 Chronic systolic heart failure ([place]) 08/22/2014  . 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 ([place]) 07/13/2014  . ESRD on dialysis ([place]) 11/11/2013  . Snoring 11/11/2013  . Unspecified sleep apnea 11/11/2013  . ESRD on hemodialysis ([place]) 09/22/2013  . Other pancytopenia ([place]) 09/22/2013    . Hemoptysis 09/20/2013  . End stage renal disease ([place]) 05/24/2013  . Chronic kidney disease (CKD), stage IV (severe) 11/25/2012  . Automatic implantable cardioverter-defibrillator in situ 10/01/2010  . DIAB W/O COMP TYPE II/UNS NOT STATED UNCNTRL 05/03/2010  . HYPERCHOLESTEROLEMIA, MIXED 05/03/2010  . Essential hypertension 05/03/2010  . CHF 05/03/2010    Past Surgical History:  Procedure Laterality Date  . AV FISTULA PLACEMENT Right 12/13/2012   Procedure: ARTERIOVENOUS (AV) FISTULA CREATION;  Surgeon: [name], MD;  Location: [place];  Service: Vascular;  Laterality: Right;  Ultrasound guided  . AV FISTULA PLACEMENT Left 05/07/2016   Procedure: LEFT RADIOCEPHALIC ARTERIOVENOUS (AV) FISTULA CREATION;  Surgeon: [name], MD;  Location: [place];  Service: Vascular;  Laterality: Left;  . BASCILIC VEIN TRANSPOSITION Right 03/26/2016   Procedure: RIGHT BASILIC VEIN TRANSPOSITION;  Surgeon: [name], MD;  Location: [place];  Service: Vascular;  Laterality: Right;  . CARDIAC CATHETERIZATION    . CARDIAC DEFIBRILLATOR PLACEMENT     Chemical engineer  . EYE SURGERY Bilateral    Cataract  . INCISION AND DRAINAGE ABSCESS / HEMATOMA OF BURSA / KNEE / THIGH Left 2012   "knee" (03/11/2013)  . INSERT / REPLACE / REMOVE PACEMAKER    . REVISON OF ARTERIOVENOUS FISTULA Right 0/03/6760   Procedure: Plication OF Right Arm ARTERIOVENOUS FISTULA;  Surgeon:  Elam Dutch, MD;  Location: [place];  Service: Vascular;  Laterality: Right;  . SHUNTOGRAM N/A 05/31/2013   Procedure: Earney Mallet;  Surgeon: [name], MD;  Location: [facility] CATH LAB;  Service: Cardiovascular;  Laterality: N/A;       Home Medications    Prior to Admission medications   Medication Sig Start Date End Date Taking? Authorizing Provider  aspirin EC 81 MG tablet Take 81 mg by mouth daily.    [provider]  carvedilol (COREG) 3.125 MG tablet Take 1 tablet (3.125 mg total) by mouth 2 (two) times daily. 08/22/14    Evans Lance, MD  oxyCODONE-acetaminophen (PERCOCET/ROXICET) 5-325 MG tablet Take 1 tablet by mouth every 6 (six) hours as needed. 05/07/16   Ulyses Amor, PA-C  SENSIPAR 30 MG tablet Take 30 mg by mouth daily. 04/21/16   [provider]    Family History Family History  Problem Relation Age of Onset  . Heart disease Father   . CAD Father     Social History Social History  Substance Use Topics  . Smoking status: Former Smoker    Packs/day: 0.75    Years: 30.00    Types: Cigarettes    Quit date: 11/25/1992  . Smokeless tobacco: Never Used  . Alcohol use No     Comment: have not had a drink 2012     Allergies   No known allergies   Review of Systems Review of Systems  Constitutional: Negative for fever.  Respiratory: Negative for cough and shortness of breath.   Cardiovascular: Positive for chest pain. Negative for palpitations and leg swelling.  Gastrointestinal: Negative for abdominal pain, nausea and vomiting.  All other systems reviewed and are negative.    Physical Exam Updated Vital Signs BP 137/86   Pulse 78   Temp 98.1 F (36.7 C) (Oral)   Resp (!) 23   Ht 5\' 7"  (1.702 m)   Wt 193 lb (87.5 kg)   SpO2 98%   BMI 30.23 kg/m   Physical Exam  Constitutional: He is oriented to person, place, and time. He appears well-developed and well-nourished. No distress.  HENT:  Head: Normocephalic and atraumatic.  Cardiovascular: Normal rate, regular rhythm and normal heart sounds.   No murmur heard. Fistula with positive thrill right upper extremity  Pulmonary/Chest: Effort normal and breath sounds normal. No respiratory distress. He has no wheezes. He exhibits no tenderness.  Abdominal: Soft. Bowel sounds are normal. There is no tenderness. There is no rebound.  Musculoskeletal: He exhibits no edema.  Neurological: He is alert and oriented to person, place, and time.  Skin: Skin is warm and dry.  Psychiatric: He has a normal mood and affect.    Nursing note and vitals reviewed.    ED Treatments / Results  Labs (all labs ordered are listed, but only abnormal results are displayed) Labs Reviewed  BASIC METABOLIC PANEL - Abnormal; Notable for the following:       Result Value   Chloride 98 (*)    BUN 31 (*)    Creatinine, Ser 8.47 (*)    GFR calc non Af Amer 6 (*)    GFR calc Af Amer 6 (*)    All other components within normal limits  CBC - Abnormal; Notable for the following:    RBC 4.14 (*)    MCV 104.3 (*)    MCH 34.3 (*)    All other components within normal limits  TROPONIN I - Abnormal; Notable  for the following:    Troponin I 0.07 (*)    All other components within normal limits  TROPONIN I    EKG  EKG Interpretation  Date/Time:  Tuesday Dec 23 2016 04:35:46 EDT Ventricular Rate:  92 PR Interval:  186 QRS Duration: 132 QT Interval:  390 QTC Calculation: 482 R Axis:   -65 Text Interpretation:  Normal sinus rhythm Left axis deviation Non-specific intra-ventricular conduction block Abnormal ECG No significant change since last tracing Confirmed by Thayer Jew [phone]) on 12/23/2016 4:56:34 AM Also confirmed by Thayer Jew [id number]), editor Hattie Perch ([id number])  on 12/23/2016 7:08:42 AM       Radiology Dg Chest 2 View  Result Date: 12/23/2016 CLINICAL DATA:  Right-sided chest pain for 1 day. EXAM: CHEST  2 VIEW COMPARISON:  Radiographs and CT 09/20/2013 FINDINGS: Single lead left-sided pacemaker remains in place, lead is intact. Slight decrease in cardiomegaly. Mediastinal contours are unchanged. No pulmonary edema. No consolidation or pleural effusion. Right axillary stent. There is degenerative change in the spine. IMPRESSION: No acute abnormality.  Slight decrease in cardiomegaly from prior. Electronically Signed   By: Jeb Levering M.D.   On: 12/23/2016 05:31   Ct Angio Chest Pe W Or Wo Contrast  Result Date: 12/23/2016 CLINICAL DATA:  Mid and right-sided chest pain for 6 days. EXAM: CT  ANGIOGRAPHY CHEST WITH CONTRAST TECHNIQUE: Multidetector CT imaging of the chest was performed using the standard protocol during bolus administration of intravenous contrast. Multiplanar CT image reconstructions and MIPs were obtained to evaluate the vascular anatomy. CONTRAST:  50 cc Isovue 370 IV COMPARISON:  Radiographs earlier this day.  Chest CT 09/20/2013 FINDINGS: Cardiovascular: Thoracic aorta is normal in caliber. Cannot assess for dissection given phase of contrast. Multi chamber cardiomegaly with primary left ventricular hypertrophy. Coronary artery calcifications are seen. Left-sided pacemaker in place. Mediastinum/Nodes: Calcified left hilar and low mediastinal nodes, sequela of prior granulomatous disease. No noncalcified mediastinal or hilar adenopathy. No axillary adenopathy. No pericardial fluid. Lungs/Pleura: Bronchial thickening is most prominent in the lower lobes. No consolidation. No evidence of pulmonary edema. Minimal dependent atelectasis at the bases. No pleural fluid. Upper Abdomen: No acute abnormality.  Tiny hiatal hernia. Musculoskeletal: Flowing anterior osteophytes throughout the thoracic spine, consistent with DISH. There are no acute or suspicious osseous abnormalities. Review of the MIP images confirms the above findings. IMPRESSION: 1. No pulmonary embolus. 2. Multi chamber cardiomegaly with left ventricular hypertrophy. Coronary artery calcifications. 3. Bronchial thickening which appears chronic. Electronically Signed   By: Jeb Levering M.D.   On: 12/23/2016 06:36    Procedures Procedures (including critical care time)  Medications Ordered in ED Medications  iopamidol (ISOVUE-370) 76 % injection (100 mLs  Contrast Given 12/23/16 0612)  HYDROcodone-acetaminophen (NORCO/VICODIN) 5-325 MG per tablet 1 tablet (1 tablet Oral Given 12/23/16 0707)     Initial Impression / Assessment and Plan / ED Course  I have reviewed the triage vital signs and the nursing  notes.  Pertinent labs & imaging results that were available during my care of the patient were reviewed by me and considered in my medical decision making (see chart for details).  Clinical Course as of Dec 23 716  Tue Dec 23, 2016  0713 Discussed with [name] patient's primary cardiologist. If repeat troponin stable, patient can follow-up very closely as an outpatient given otherwise reassuring workup.  [CH]    Clinical Course User Index [CH] Jolita Haefner, Barbette Hair, MD    Patient presents with chest pain.  Intermittent in nature. Pleuritic. Nontoxic-appearing. Vital signs reassuring. History of cardiomyopathy. Given pleuritic component, PE would be consideration. EKG shows no acute ischemia. Initial troponin is 0.07. This is in the setting of end-stage renal disease. It is down from his prior troponins. CT shows no evidence of PE. Discussed with [name]. He requests repeat troponin. If stable, close follow-up in the office recommended.  Final Clinical Impressions(s) / ED Diagnoses   Final diagnoses:  Chest pain    New Prescriptions New Prescriptions   No medications on file     Merryl Hacker, MD 12/23/16 [phone]

## 2016-12-23 NOTE — Progress Notes (Signed)
Remote ICD transmission.   

## 2016-12-24 ENCOUNTER — Encounter: Payer: Self-pay | Admitting: Cardiology

## 2016-12-24 LAB — CUP PACEART REMOTE DEVICE CHECK
Battery Remaining Longevity: 84 mo
Battery Remaining Percentage: 87 %
Date Time Interrogation Session: 20180515102200
HighPow Impedance: 108 Ohm
Implantable Lead Location: 753860
Implantable Lead Model: 181
Implantable Lead Serial Number: 313758
Implantable Pulse Generator Implant Date: 20111118
Lead Channel Pacing Threshold Pulse Width: 0.5 ms
MDC IDC LEAD IMPLANT DT: 20111118
MDC IDC MSMT LEADCHNL RV IMPEDANCE VALUE: 610 Ohm
MDC IDC MSMT LEADCHNL RV PACING THRESHOLD AMPLITUDE: 0.8 V
MDC IDC SET LEADCHNL RV PACING AMPLITUDE: 2.4 V
MDC IDC SET LEADCHNL RV PACING PULSEWIDTH: 0.5 ms
MDC IDC SET LEADCHNL RV SENSING SENSITIVITY: 0.5 mV
MDC IDC STAT BRADY RV PERCENT PACED: 0 %
Pulse Gen Serial Number: 268731

## 2017-01-06 ENCOUNTER — Ambulatory Visit: Payer: Medicare Other | Admitting: Cardiovascular Disease

## 2017-01-07 ENCOUNTER — Encounter: Payer: Self-pay | Admitting: *Deleted

## 2017-01-22 ENCOUNTER — Encounter (INDEPENDENT_AMBULATORY_CARE_PROVIDER_SITE_OTHER): Payer: Self-pay

## 2017-01-22 ENCOUNTER — Encounter: Payer: Self-pay | Admitting: Internal Medicine

## 2017-01-22 ENCOUNTER — Ambulatory Visit (INDEPENDENT_AMBULATORY_CARE_PROVIDER_SITE_OTHER): Payer: Medicare Other | Admitting: Internal Medicine

## 2017-01-22 VITALS — BP 169/106 | HR 91 | Ht 68.0 in | Wt 194.8 lb

## 2017-01-22 DIAGNOSIS — I5022 Chronic systolic (congestive) heart failure: Secondary | ICD-10-CM

## 2017-01-22 DIAGNOSIS — Z95810 Presence of automatic (implantable) cardiac defibrillator: Secondary | ICD-10-CM | POA: Diagnosis not present

## 2017-01-22 DIAGNOSIS — I428 Other cardiomyopathies: Secondary | ICD-10-CM

## 2017-01-22 NOTE — Progress Notes (Signed)
HPI Mr. Bozzo returns today for followup. He is a pleasant 71 yo man with ESRD on HD, chronic systolic heart failure and s/p ICD implant. His weighthas been stable. He denies sob or peripheral edema. He does have problems with dizziness and orthostasis. No ICD shock. He has had to undergo fistula revision and his current HD site is in the left forearm.  Allergies  Allergen Reactions  . No Known Allergies      Current Outpatient Prescriptions  Medication Sig Dispense Refill  . aspirin EC 81 MG tablet Take 81 mg by mouth daily.    Lorin Picket 1 GM 210 MG(Fe) tablet Take 420 mg by mouth 2 (two) times daily.     . clopidogrel (PLAVIX) 75 MG tablet Take 75 mg by mouth daily.    . SENSIPAR 30 MG tablet Take 30 mg by mouth daily.     No current facility-administered medications for this visit.      Past Medical History:  Diagnosis Date  . Anemia   . Arthritis    Gout  . Automatic implantable cardioverter-defibrillator in situ    [employer]  . CHF   . DIAB W/O COMP TYPE II/UNS NOT STATED UNCNTRL    No medication  . ESRD on dialysis [facility])    [name] 03/11/2013 (03/11/2013) dialysis M/W/F  . ESRD on dialysis ([place])   . GERD (gastroesophageal reflux disease)   . Gout    "once a year"  . Heart murmur   . HYPERCHOLESTEROLEMIA, MIXED   . HYPERTENSION   . Macular degeneration   . 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 Pacemaker   . Peripheral arterial disease (HCC)    left fifth toe ulcer, healing  . Pneumonia   . Shortness of breath   . 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   Past Surgical History:  Procedure Laterality Date  . AV FISTULA PLACEMENT Right 12/13/2012   Procedure: ARTERIOVENOUS (AV) FISTULA CREATION;  Surgeon: [name], MD;  Location: [facility];  Service: Vascular;  Laterality: Right;  Ultrasound guided  . AV FISTULA PLACEMENT Left  05/07/2016   Procedure: LEFT RADIOCEPHALIC ARTERIOVENOUS (AV) FISTULA CREATION;  Surgeon: [name], MD;  Location: [place];  Service: Vascular;  Laterality: Left;  . BASCILIC VEIN TRANSPOSITION Right 03/26/2016   Procedure: RIGHT BASILIC VEIN TRANSPOSITION;  Surgeon: [name], MD;  Location: [place];  Service: Vascular;  Laterality: Right;  . CARDIAC CATHETERIZATION    . CARDIAC DEFIBRILLATOR PLACEMENT     Chemical engineer  . EYE SURGERY Bilateral    Cataract  . INCISION AND DRAINAGE ABSCESS / HEMATOMA OF BURSA / KNEE / THIGH Left 2012   "knee" (03/11/2013)  . INSERT / REPLACE / REMOVE PACEMAKER    . REVISON OF ARTERIOVENOUS FISTULA Right 3/87/5643   Procedure: Plication OF Right Arm ARTERIOVENOUS FISTULA;  Surgeon: Elam Dutch, MD;  Location: [place];  Service: Vascular;  Laterality: Right;  . SHUNTOGRAM N/A 05/31/2013   Procedure: Earney Mallet;  Surgeon: [name], MD;  Location: [facility] CATH LAB;  Service: Cardiovascular;  Laterality: N/A;     Family History  Problem Relation Age of Onset  . Heart disease Father   . CAD Father      Social History   Social History  . Marital status: Married  Spouse name: [name]  . Number of children: 2  . Years of education: 13   Occupational History  . Not on file.   Social History Main Topics  . Smoking status: Former Smoker    Packs/day: 0.75    Years: 30.00    Types: Cigarettes    Quit date: 11/25/1992  . Smokeless tobacco: Never Used  . Alcohol use No     Comment: have not had a drink 2012  . Drug use: No  . Sexual activity: Yes   Other Topics Concern  . Not on file   Social History Narrative   Patient is married [name] and lives with his wife and child   Patient has two children.   Patient is retired.   Patient has a college education.   Patient is right-handed.   Patient drinks very little caffeine.           BP (!) 169/106 Comment: dynamap  Pulse 91   Ht 5\' 8"  (1.727 m)   Wt 194 lb 12.8 oz (88.4 kg)    SpO2 98%   BMI 29.62 kg/m   Physical Exam:  Well appearing 71 yo man, NAD HEENT: Unremarkable Neck:  7 cm JVD, no thyromegally Lymphatics:  No adenopathy Back:  No CVA tenderness Lungs:  Clear with no wheezes HEART:  Regular rate rhythm, no murmurs, no rubs, no clicks Abd:  soft, positive bowel sounds, no organomegally, no rebound, no guarding Ext:  2 plus pulses, no edema, no cyanosis, no clubbing Skin:  No rashes no nodules Neuro:  CN II through XII intact, motor grossly intact   DEVICE  Normal device function.  See PaceArt for details.   Assess/Plan: 1. VT - he has only had non-sustained episodes. Asymptomatic. 2. Chronic systolic heart failure - his volume status is reasonably well controlled with HD. He will continue a low salt diet.  3. ICD - his [employer] single chamber ICD is working normally. Will recheck in several months. 4. HTN - his blood pressure is elevated today. I have encouraged him to start walking, reduce his salt intake and watch his intake. I would like him to lose some weight.  Timothy Bosworth.D.

## 2017-01-22 NOTE — Patient Instructions (Signed)
Medication Instructions:  Your physician recommends that you continue on your current medications as directed. Please refer to the Current Medication list given to you today.   Labwork: None Ordered   Testing/Procedures: None Ordered   Follow-Up: Your physician wants you to follow-up in: 1 year with Dr. Lovena Le. You will receive a reminder letter in the mail two months in advance. If you don't receive a letter, please call our office to schedule the follow-up appointment.  Remote monitoring is used to monitor your  ICD from home. This monitoring reduces the number of office visits required to check your device to one time per year. It allows [place] to keep an eye on the functioning of your device to ensure it is working properly. You are scheduled for a device check from home on  04/23/17 . You may send your transmission at any time that day. If you have a wireless device, the transmission will be sent automatically. After your physician reviews your transmission, you will receive a postcard with your next transmission date.    Any Other Special Instructions Will Be Listed Below (If Applicable).     If you need a refill on your cardiac medications before your next appointment, please call your pharmacy.

## 2017-01-25 LAB — CUP PACEART INCLINIC DEVICE CHECK
Brady Statistic RV Percent Paced: 1 % — CL
Date Time Interrogation Session: 20180614040000
HIGH POWER IMPEDANCE MEASURED VALUE: 95 Ohm
HighPow Impedance: 58 Ohm
Implantable Lead Location: 753860
Implantable Lead Model: 181
Lead Channel Impedance Value: 521 Ohm
Lead Channel Setting Pacing Amplitude: 2.4 V
Lead Channel Setting Pacing Pulse Width: 0.5 ms
MDC IDC LEAD IMPLANT DT: 20111118
MDC IDC LEAD SERIAL: 313758
MDC IDC MSMT LEADCHNL RV PACING THRESHOLD AMPLITUDE: 0.8 V
MDC IDC MSMT LEADCHNL RV PACING THRESHOLD PULSEWIDTH: 0.5 ms
MDC IDC MSMT LEADCHNL RV SENSING INTR AMPL: 23.2 mV
MDC IDC PG IMPLANT DT: 20111118
MDC IDC PG SERIAL: 268731
MDC IDC SET LEADCHNL RV SENSING SENSITIVITY: 0.5 mV

## 2017-03-23 ENCOUNTER — Telehealth: Payer: Self-pay | Admitting: *Deleted

## 2017-03-23 NOTE — Telephone Encounter (Signed)
F/u Message ° °Pt returning RN call. Please call back to discuss  °

## 2017-03-23 NOTE — Telephone Encounter (Signed)
Remote alert from ICD monitor- VT episode terminated with a shock 03/19/17.  LMOM to call back to [facility].

## 2017-03-23 NOTE — Telephone Encounter (Signed)
Mr. Frick does not recall any symptoms or what he was doing at the time of the episode. He is aware of [place] driving restrictions x 6 months. I let him know that I would call back with any recommendations. He verbalizes understanding.

## 2017-03-24 MED ORDER — CARVEDILOL 3.125 MG PO TABS: ORAL_TABLET | ORAL | 3 refills | Status: DC

## 2017-03-24 NOTE — Telephone Encounter (Signed)
Timothy Faulkner reviewed episode with Dr.Klein. Dr.Klein recommended that patient start Coreg 3.125mg  BID on non dialysis days, have orthostatics performed at dialysis, and f/u with Timothy Standard, PA.  Called patient and informed him of Dr.Klein's recommendations. Patient verbalized understanding and asked that his Rx be sent to the [facility] on [address].   Will defer scheduling to [facility].  Spoke with Quita Skye at [facility] to ensure that patient receives orthostatics at dialysis. Adam states that patient gets orthostatic BP checks before and after dialysis. Informed Adam of new Rx and the importance of getting the BP readings. Adam verbalized understanding.

## 2017-03-26 ENCOUNTER — Other Ambulatory Visit: Payer: Self-pay | Admitting: Internal Medicine

## 2017-04-03 ENCOUNTER — Encounter: Payer: Medicare Other | Admitting: Physician Assistant

## 2017-04-12 NOTE — Progress Notes (Signed)
Cardiology Office Note Date:  04/14/2017  Patient ID:  Timothy Faulkner, Timothy Faulkner 14-Mar-1946, MRN [id number] PCP:  [name], MD  Cardiologist:  [name] Electrophysiologist: Dr. Lovena Le Nephrologist: Dr. Posey Pronto   Chief Complaint: s/p ICD shock  History of Present Illness: Timothy Faulkner is a 71 y.o. male with history of ESRF on HD, DM, chronic CHF (systolic), NICM, VT w/ICD,  HTN, HLD.  He comes in today to be seen for Dr. Lovena Le, last seen by him in June, at that time doing well.    He received ICD therapy Aug 8, RN noted no symptoms or awraeness of the event, he was made aware of [place] law no driving for 6 months post device therapy, Dr. Caryl Comes recommended cored 3.125mg  on non-dialysis days with orthostatic VS to follow.  He reports in the last couple months having at night some SOB that improves with sitting up, he mentioned it to his dialysis MD and they decreased his dry weight he thinks there has been some improvement but not resolution, he has not noticed if it occurs on his "Off" dialysis nghts.  He denies DOE.  He does have a dry cough associated with bitter taste in his mouth with known GERD.  No cough otherwise, no fever or symptoms of illness.  He denies any kind of CP, palpitations he did feel weak the day he was told he got device therapy, thought he had lost hisbalance, did not faint.  No recurrent episodes.  He continues to see [name] regularly, due for a visit.  He denies any orthostatic symptoms, states at the beginning and end of every HD session his BP is checked standing and has been OK.  He is tolerating the coreg at the current regime (taking on days without HD only, T,TH, Sat, Sun only).  No CP, palpitations, no syncope.  Device information: BSCi single chamber ICD implanted 06/28/10, primary prevention, Dr. Lovena Le + hx of appropriate therapy for VT (ATP) 03/19/17  Past Medical History:  Diagnosis Date  . Anemia   . Arthritis    Gout  . Automatic  implantable cardioverter-defibrillator in situ    [employer]  . CHF   . DIAB W/O COMP TYPE II/UNS NOT STATED UNCNTRL    No medication  . ESRD on dialysis [facility])    [name] 03/11/2013 (03/11/2013) dialysis M/W/F  . ESRD on dialysis ([place])   . GERD (gastroesophageal reflux disease)   . Gout    "once a year"  . Heart murmur   . HYPERCHOLESTEROLEMIA, MIXED   . HYPERTENSION   . Macular degeneration   . 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 Pacemaker   . Peripheral arterial disease (HCC)    left fifth toe ulcer, healing  . Pneumonia   . Shortness of breath   . 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 AV FISTULA PLACEMENT Right 12/13/2012   Procedure: ARTERIOVENOUS (AV) FISTULA CREATION;  Surgeon: [name], MD;  Location: [place];  Service: Vascular;  Laterality: Right;  Ultrasound guided  . AV FISTULA PLACEMENT Left 05/07/2016   Procedure: LEFT RADIOCEPHALIC ARTERIOVENOUS (AV) FISTULA CREATION;  Surgeon: [name], MD;  Location: [place];  Service: Vascular;  Laterality: Left;  . BASCILIC VEIN TRANSPOSITION Right 03/26/2016   Procedure: RIGHT BASILIC VEIN TRANSPOSITION;  Surgeon: [name], MD;  Location: [place];  Service:  Vascular;  Laterality: Right;  . CARDIAC CATHETERIZATION    . CARDIAC DEFIBRILLATOR PLACEMENT     Chemical engineer  . EYE SURGERY Bilateral    Cataract  . INCISION AND DRAINAGE ABSCESS / HEMATOMA OF BURSA / KNEE / THIGH Left 2012   "knee" (03/11/2013)  . INSERT / REPLACE / REMOVE PACEMAKER    . REVISON OF ARTERIOVENOUS FISTULA Right 11/11/4740   Procedure: Plication OF Right Arm ARTERIOVENOUS FISTULA;  Surgeon: Elam Dutch, MD;  Location: [place];  Service: Vascular;  Laterality: Right;  . SHUNTOGRAM N/A 05/31/2013   Procedure: Earney Mallet;  Surgeon: [name], MD;  Location: [facility] CATH LAB;  Service: Cardiovascular;  Laterality: N/A;    Current  Outpatient Prescriptions  Medication Sig Dispense Refill  . aspirin EC 81 MG tablet Take 81 mg by mouth daily.    Lorin Picket 1 GM 210 MG(Fe) tablet Take 420 mg by mouth 2 (two) times daily.     . carvedilol (COREG) 3.125 MG tablet Take one tablet (3.125mg ) by mouth twice daily on non dialysis days 40 tablet 3  . clopidogrel (PLAVIX) 75 MG tablet Take 75 mg by mouth daily.    . SENSIPAR 30 MG tablet Take 30 mg by mouth daily.     No current facility-administered medications for this visit.     Allergies:   No known allergies   Social History:  The patient  reports that he quit smoking about 24 years ago. His smoking use included Cigarettes. He has a 22.50 pack-year smoking history. He has never used smokeless tobacco. He reports that he does not drink alcohol or use drugs.   Family History:  The patient's family history includes CAD in his father; Heart disease in his father.  ROS:  Please see the history of present illness.  All other systems are reviewed and otherwise negative.   PHYSICAL EXAM:  VS:  BP 108/60   Pulse 82   Ht 5\' 8"  (1.727 m)   Wt 182 lb (82.6 kg)   BMI 27.67 kg/m  BMI: Body mass index is 27.67 kg/m. Well nourished, well developed, in no acute distress  HEENT: normocephalic, atraumatic  Neck: no JVD, carotid bruits or masses Cardiac:  RR; no significant murmurs, no rubs, or gallops Lungs:  CTA b/l, no wheezing, rhonchi or rales  Abd: soft, nontender MS: no deformity or atrophy Ext: no edema  Skin: warm and dry, no rash Neuro:  No gross deficits appreciated Psych: euthymic mood, full affect  ICD site is stable, no tethering or discomfort   EKG:  Done today and reviewed by myself is SR, appears similar to prior ICD interrogation done today by industry and reviewed by myself: battery and lead measurements are good, 2 NSVT and the known VT episodes that was successfully terminated with ATP (no shock)  03/13/13: TTE Study Conclusions - Left ventricle: The  cavity size was moderately dilated. Systolic function was severely reduced. The estimated ejection fraction was in the range of 20% to 25%. Wall motion was normal; there were no regional wall motion abnormalities. - Mitral valve: Mild regurgitation. - Left atrium: The atrium was mildly dilated. - Atrial septum: No defect or patent foramen ovale was identified. - Tricuspid valve: Moderate regurgitation. - Pulmonary arteries: Systolic pressure was moderately increased. PA peak pressure: 47mm Hg (S)  07/12/13: Lexiscan stress test FINDINGS: The left ventricle is dilated. No fixed or reversible defects to suggest infarction or ischemia. There is global hypokinesis with an ejection fraction  of 28%. IMPRESSION: Dilated left ventricle with global hypokinesis and estimated ejection fraction of 28%.  No findings for infarction or ischemia.  Recent Labs: 12/23/2016: BUN 31; Creatinine, Ser 8.47; Hemoglobin 14.2; Platelets 238; Potassium 4.0; Sodium 139  No results found for requested labs within last 8760 hours.   CrCl cannot be calculated (Patient's most recent lab result is older than the maximum 21 days allowed.).   Wt Readings from Last 3 Encounters:  04/14/17 182 lb (82.6 kg)  01/22/17 194 lb 12.8 oz (88.4 kg)  12/23/16 193 lb (87.5 kg)     Other studies reviewed: Additional studies/records reviewed today include: summarized above  ASSESSMENT AND PLAN:  1. ICD     Intact function, no changes made  2. NICM     His exam appears euvolemic     Fluid management is with dialysis     He describes symptoms of orthopnea intermittently, he will revisit with his nephrologist  3. HTN     No symptoms of orthostasis with the coreg, BP tolerating, he reports generally about 110-130 SBP at dialysis  4. VT     None further with coreg on board.     The patient was made aware previously of [place] law no driving for 6 months (Placed a call to the patient after he left to  remind him of this)  Disposition: Dr. Lovena Le in 6 months, device remotes Q 3 months.   He will be seeing Der. Harwani in the next couple weeks.  Current medicines are reviewed at length with the patient today.  The patient did not have any concerns regarding medicines.  Haywood Lasso, PA-C 04/14/2017 9:56 AM     [facility] [address] [place] [address] [phone] (office)  [phone] (fax)

## 2017-04-14 ENCOUNTER — Ambulatory Visit (INDEPENDENT_AMBULATORY_CARE_PROVIDER_SITE_OTHER): Payer: Medicare Other | Admitting: Physician Assistant

## 2017-04-14 ENCOUNTER — Telehealth: Payer: Self-pay | Admitting: *Deleted

## 2017-04-14 ENCOUNTER — Encounter (INDEPENDENT_AMBULATORY_CARE_PROVIDER_SITE_OTHER): Payer: Self-pay

## 2017-04-14 ENCOUNTER — Telehealth: Payer: Self-pay | Admitting: Physician Assistant

## 2017-04-14 VITALS — BP 108/60 | HR 82 | Ht 68.0 in | Wt 182.0 lb

## 2017-04-14 DIAGNOSIS — I472 Ventricular tachycardia, unspecified: Secondary | ICD-10-CM

## 2017-04-14 DIAGNOSIS — I428 Other cardiomyopathies: Secondary | ICD-10-CM

## 2017-04-14 DIAGNOSIS — Z95810 Presence of automatic (implantable) cardiac defibrillator: Secondary | ICD-10-CM | POA: Diagnosis not present

## 2017-04-14 NOTE — Telephone Encounter (Signed)
SPOKE TO PT AND IS AWARE NO DRIVING HISSELF FOR THE NEXT 6 MONTHS

## 2017-04-14 NOTE — Telephone Encounter (Signed)
Follow Up:; ° ° °Returning your call. °

## 2017-04-14 NOTE — Patient Instructions (Addendum)
.  Medication Instructions:   Your physician recommends that you continue on your current medications as directed. Please refer to the Current Medication list given to you today.   If you need a refill on your cardiac medications before your next appointment, please call your pharmacy.  Labwork: NONE ORDERED  TODAY    Testing/Procedures: NONE ORDERED  TODAY    Follow-Up: Your physician wants you to follow-up in:  [place] will receive a reminder letter in the mail two months in advance. If you don't receive a letter, please call our office to schedule the follow-up appointment.  Remote monitoring is used to monitor your Pacemaker of ICD from home. This monitoring reduces the number of office visits required to check your device to one time per year. It allows [place] to keep an eye on the functioning of your device to ensure it is working properly. You are scheduled for a device check from home on . 04-23-2017 You may send your transmission at any time that day. If you have a wireless device, the transmission will be sent automatically. After your physician reviews your transmission, you will receive a postcard with your next transmission date.     Any Other Special Instructions Will Be Listed Below (If Applicable).

## 2017-04-14 NOTE — Telephone Encounter (Signed)
LMOVM TO CALL OFFICE BACK  FOR DRIVING INSTRUCTIONS AS SOON AS POSSIBLE

## 2017-04-23 ENCOUNTER — Ambulatory Visit (INDEPENDENT_AMBULATORY_CARE_PROVIDER_SITE_OTHER): Payer: Medicare Other | Admitting: *Deleted

## 2017-04-23 DIAGNOSIS — I428 Other cardiomyopathies: Secondary | ICD-10-CM | POA: Diagnosis not present

## 2017-04-23 NOTE — Progress Notes (Signed)
Remote ICD transmission.   

## 2017-04-24 ENCOUNTER — Encounter: Payer: Self-pay | Admitting: Cardiology

## 2017-04-30 LAB — CUP PACEART REMOTE DEVICE CHECK
Battery Remaining Longevity: 78 mo
Date Time Interrogation Session: 20180913054400
HighPow Impedance: 88 Ohm
Implantable Lead Implant Date: 20111118
Implantable Lead Location: 753860
Implantable Lead Serial Number: 313758
Implantable Pulse Generator Implant Date: 20111118
Lead Channel Pacing Threshold Pulse Width: 0.5 ms
Lead Channel Setting Pacing Amplitude: 2.4 V
Lead Channel Setting Pacing Pulse Width: 0.5 ms
MDC IDC MSMT BATTERY REMAINING PERCENTAGE: 83 %
MDC IDC MSMT LEADCHNL RV IMPEDANCE VALUE: 472 Ohm
MDC IDC MSMT LEADCHNL RV PACING THRESHOLD AMPLITUDE: 0.8 V
MDC IDC SET LEADCHNL RV SENSING SENSITIVITY: 0.5 mV
MDC IDC STAT BRADY RV PERCENT PACED: 0 %
Pulse Gen Serial Number: 268731

## 2017-05-11 ENCOUNTER — Telehealth: Payer: Self-pay | Admitting: *Deleted

## 2017-05-11 NOTE — Telephone Encounter (Signed)
[email or web address]  VT treated successfully with ATP on 05/07/17 @ 00:34.

## 2017-05-14 NOTE — Telephone Encounter (Signed)
New Message ° ° pt verbalized that he is returning call for rn  °

## 2017-05-14 NOTE — Telephone Encounter (Signed)
Spoke with pt regarding ATP episode from 05/07/17 pt couldn't recall any symptoms from that date pt aware of driving restrictions x 6 months.

## 2017-05-15 ENCOUNTER — Telehealth: Payer: Self-pay

## 2017-05-15 NOTE — Telephone Encounter (Signed)
Follow Up:    Daughter calling to follow up on your call from yesterday.

## 2017-06-24 ENCOUNTER — Encounter: Payer: Self-pay | Admitting: Nephrology

## 2017-06-24 DIAGNOSIS — I472 Ventricular tachycardia, unspecified: Secondary | ICD-10-CM | POA: Insufficient documentation

## 2017-07-23 ENCOUNTER — Ambulatory Visit (INDEPENDENT_AMBULATORY_CARE_PROVIDER_SITE_OTHER): Payer: Medicare Other | Admitting: *Deleted

## 2017-07-23 DIAGNOSIS — I428 Other cardiomyopathies: Secondary | ICD-10-CM | POA: Diagnosis not present

## 2017-07-23 NOTE — Progress Notes (Signed)
Remote ICD transmission.   

## 2017-07-28 ENCOUNTER — Encounter: Payer: Self-pay | Admitting: Cardiology

## 2017-07-28 NOTE — Progress Notes (Signed)
Letter  

## 2017-08-05 LAB — CUP PACEART REMOTE DEVICE CHECK
Battery Remaining Percentage: 81 %
Brady Statistic RV Percent Paced: 0 %
HIGH POWER IMPEDANCE MEASURED VALUE: 72 Ohm
Implantable Lead Location: 753860
Implantable Lead Serial Number: 313758
Lead Channel Impedance Value: 479 Ohm
Lead Channel Pacing Threshold Amplitude: 0.8 V
Lead Channel Pacing Threshold Pulse Width: 0.5 ms
Lead Channel Setting Pacing Amplitude: 2.4 V
Lead Channel Setting Sensing Sensitivity: 0.5 mV
MDC IDC LEAD IMPLANT DT: 20111118
MDC IDC MSMT BATTERY REMAINING LONGEVITY: 78 mo
MDC IDC PG IMPLANT DT: 20111118
MDC IDC SESS DTM: 20181213063200
MDC IDC SET LEADCHNL RV PACING PULSEWIDTH: 0.5 ms
Pulse Gen Serial Number: 268731

## 2017-10-22 ENCOUNTER — Ambulatory Visit (INDEPENDENT_AMBULATORY_CARE_PROVIDER_SITE_OTHER): Payer: Medicare Other | Admitting: *Deleted

## 2017-10-22 DIAGNOSIS — I428 Other cardiomyopathies: Secondary | ICD-10-CM | POA: Diagnosis not present

## 2017-10-22 NOTE — Progress Notes (Signed)
Remote ICD transmission.   

## 2017-10-23 ENCOUNTER — Encounter: Payer: Self-pay | Admitting: Cardiology

## 2017-11-06 LAB — CUP PACEART REMOTE DEVICE CHECK
Battery Remaining Percentage: 78 %
Brady Statistic RV Percent Paced: 0 %
HIGH POWER IMPEDANCE MEASURED VALUE: 105 Ohm
Implantable Lead Implant Date: 20111118
Implantable Lead Serial Number: 313758
Lead Channel Impedance Value: 539 Ohm
Lead Channel Pacing Threshold Amplitude: 0.8 V
Lead Channel Pacing Threshold Pulse Width: 0.5 ms
Lead Channel Setting Pacing Amplitude: 2.4 V
Lead Channel Setting Sensing Sensitivity: 0.5 mV
MDC IDC LEAD LOCATION: 753860
MDC IDC MSMT BATTERY REMAINING LONGEVITY: 72 mo
MDC IDC PG IMPLANT DT: 20111118
MDC IDC SESS DTM: 20190314053100
MDC IDC SET LEADCHNL RV PACING PULSEWIDTH: 0.5 ms
Pulse Gen Serial Number: 268731

## 2018-01-21 ENCOUNTER — Ambulatory Visit (INDEPENDENT_AMBULATORY_CARE_PROVIDER_SITE_OTHER): Payer: Medicare Other | Admitting: *Deleted

## 2018-01-21 DIAGNOSIS — I428 Other cardiomyopathies: Secondary | ICD-10-CM

## 2018-01-21 NOTE — Progress Notes (Signed)
Remote ICD transmission.   

## 2018-02-12 LAB — CUP PACEART REMOTE DEVICE CHECK
Date Time Interrogation Session: 20190613054300
HIGH POWER IMPEDANCE MEASURED VALUE: 118 Ohm
Implantable Lead Location: 753860
Implantable Lead Serial Number: 313758
Implantable Pulse Generator Implant Date: 20111118
Lead Channel Impedance Value: 695 Ohm
Lead Channel Pacing Threshold Amplitude: 0.8 V
Lead Channel Pacing Threshold Pulse Width: 0.5 ms
Lead Channel Setting Pacing Amplitude: 2.4 V
MDC IDC LEAD IMPLANT DT: 20111118
MDC IDC MSMT BATTERY REMAINING LONGEVITY: 72 mo
MDC IDC MSMT BATTERY REMAINING PERCENTAGE: 75 %
MDC IDC SET LEADCHNL RV PACING PULSEWIDTH: 0.5 ms
MDC IDC SET LEADCHNL RV SENSING SENSITIVITY: 0.5 mV
MDC IDC STAT BRADY RV PERCENT PACED: 0 %
Pulse Gen Serial Number: 268731

## 2018-03-09 ENCOUNTER — Ambulatory Visit (INDEPENDENT_AMBULATORY_CARE_PROVIDER_SITE_OTHER): Payer: Medicare Other | Admitting: Internal Medicine

## 2018-03-09 ENCOUNTER — Encounter: Payer: Self-pay | Admitting: Internal Medicine

## 2018-03-09 VITALS — BP 110/70 | HR 86 | Ht 68.0 in | Wt 176.0 lb

## 2018-03-09 DIAGNOSIS — I5022 Chronic systolic (congestive) heart failure: Secondary | ICD-10-CM | POA: Diagnosis not present

## 2018-03-09 DIAGNOSIS — I472 Ventricular tachycardia, unspecified: Secondary | ICD-10-CM

## 2018-03-09 DIAGNOSIS — I10 Essential (primary) hypertension: Secondary | ICD-10-CM

## 2018-03-09 DIAGNOSIS — Z95810 Presence of automatic (implantable) cardiac defibrillator: Secondary | ICD-10-CM

## 2018-03-09 NOTE — Patient Instructions (Signed)
Medication Instructions:  Your physician recommends that you continue on your current medications as directed. Please refer to the Current Medication list given to you today.  Labwork: None ordered.  Testing/Procedures: None ordered.  Follow-Up: Your physician wants you to follow-up in: one year with Dr. Lovena Le.   You will receive a reminder letter in the mail two months in advance. If you don't receive a letter, please call our office to schedule the follow-up appointment.  Remote monitoring is used to monitor your ICD from home. This monitoring reduces the number of office visits required to check your device to one time per year. It allows [place] to keep an eye on the functioning of your device to ensure it is working properly. You are scheduled for a device check from home on 04/22/2018. You may send your transmission at any time that day. If you have a wireless device, the transmission will be sent automatically. After your physician reviews your transmission, you will receive a postcard with your next transmission date.  Any Other Special Instructions Will Be Listed Below (If Applicable).  If you need a refill on your cardiac medications before your next appointment, please call your pharmacy.

## 2018-03-09 NOTE — Progress Notes (Signed)
HPI Timothy Faulkner returns today for followup. He is a pleasant 72 yo man with ESRD on HD, chronic systolic heart failure and s/p ICD implant. His weight has been stable. He denies sob or peripheral edema. He does have problems with dizziness and orthostasis. He received ICD therapy for VT back in September. He was having some shaking spells followed by falling out and was having hypotension. He has stopped taking coreg and his symptoms have improved/resolved.  Allergies  Allergen Reactions  . No Known Allergies      Current Outpatient Medications  Medication Sig Dispense Refill  . aspirin EC 81 MG tablet Take 81 mg by mouth daily.    Lorin Picket 1 GM 210 MG(Fe) tablet Take 420 mg by mouth 2 (two) times daily.     . carvedilol (COREG) 3.125 MG tablet Take one tablet (3.125mg ) by mouth twice daily on non dialysis days (Patient taking differently: Take one tablet (3.125mg ) by mouth as needed daily) 40 tablet 3  . clopidogrel (PLAVIX) 75 MG tablet Take 75 mg by mouth daily.    . colchicine 0.6 MG tablet Take 0.6 mg by mouth daily as needed.  1  . SENSIPAR 30 MG tablet Take 30 mg by mouth daily.     No current facility-administered medications for this visit.      Past Medical History:  Diagnosis Date  . Anemia   . Arthritis    Gout  . Automatic implantable cardioverter-defibrillator in situ    [employer]  . CHF   . DIAB W/O COMP TYPE II/UNS NOT STATED UNCNTRL    No medication  . ESRD on dialysis [facility])    [name] 03/11/2013 (03/11/2013) dialysis M/W/F  . ESRD on dialysis ([place])   . GERD (gastroesophageal reflux disease)   . Gout    "once a year"  . Heart murmur   . HYPERCHOLESTEROLEMIA, MIXED   . HYPERTENSION   . Macular degeneration   . 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 Pacemaker   . Peripheral arterial disease (HCC)    left fifth toe ulcer, healing  . Pneumonia   . Shortness of breath   . 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   Past Surgical History:  Procedure Laterality Date  . AV FISTULA PLACEMENT Right 12/13/2012   Procedure: ARTERIOVENOUS (AV) FISTULA CREATION;  Surgeon: [name], MD;  Location: [place];  Service: Vascular;  Laterality: Right;  Ultrasound guided  . AV FISTULA PLACEMENT Left 05/07/2016   Procedure: LEFT RADIOCEPHALIC ARTERIOVENOUS (AV) FISTULA CREATION;  Surgeon: [name], MD;  Location: [place];  Service: Vascular;  Laterality: Left;  . BASCILIC VEIN TRANSPOSITION Right 03/26/2016   Procedure: RIGHT BASILIC VEIN TRANSPOSITION;  Surgeon: [name], MD;  Location: [place];  Service: Vascular;  Laterality: Right;  . CARDIAC CATHETERIZATION    . CARDIAC DEFIBRILLATOR PLACEMENT     Chemical engineer  . EYE SURGERY Bilateral    Cataract  . INCISION AND DRAINAGE ABSCESS / HEMATOMA OF BURSA / KNEE / THIGH Left 2012   "knee" (03/11/2013)  . INSERT / REPLACE / REMOVE PACEMAKER    . REVISON OF ARTERIOVENOUS FISTULA Right 8/56/3149   Procedure: Plication OF Right Arm ARTERIOVENOUS FISTULA;  Surgeon: Elam Dutch, MD;  Location: [place];  Service: Vascular;  Laterality: Right;  .  SHUNTOGRAM N/A 05/31/2013   Procedure: Earney Mallet;  Surgeon: [name], MD;  Location: [facility] CATH LAB;  Service: Cardiovascular;  Laterality: N/A;     Family History  Problem Relation Age of Onset  . Heart disease Father   . CAD Father      Social History   Socioeconomic History  . Marital status: Married    Spouse name: [name]  . Number of children: 2  . Years of education: 32  . Highest education level: Not on file  Occupational History  . Not on file  Social Needs  . Financial resource strain: Not on file  . Food insecurity:    Worry: Not on file    Inability: Not on file  . Transportation needs:    Medical: Not on file    Non-medical: Not on file  Tobacco Use  . Smoking status: Former Smoker     Packs/day: 0.75    Years: 30.00    Pack years: 22.50    Types: Cigarettes    Last attempt to quit: 11/25/1992    Years since quitting: 25.3  . Smokeless tobacco: Never Used  Substance and Sexual Activity  . Alcohol use: No    Comment: have not had a drink 2012  . Drug use: No  . Sexual activity: Yes  Lifestyle  . Physical activity:    Days per week: Not on file    Minutes per session: Not on file  . Stress: Not on file  Relationships  . Social connections:    Talks on phone: Not on file    Gets together: Not on file    Attends religious service: Not on file    Active member of club or organization: Not on file    Attends meetings of clubs or organizations: Not on file    Relationship status: Not on file  . Intimate partner violence:    Fear of current or ex partner: Not on file    Emotionally abused: Not on file    Physically abused: Not on file    Forced sexual activity: Not on file  Other Topics Concern  . Not on file  Social History Narrative   Patient is married [name] and lives with his wife and child   Patient has two children.   Patient is retired.   Patient has a college education.   Patient is right-handed.   Patient drinks very little caffeine.           BP 110/70   Pulse 86   Ht 5\' 8"  (1.727 m)   Wt 176 lb (79.8 kg)   BMI 26.76 kg/m   Physical Exam:  Well appearing 72 yo man, NAD HEENT: Unremarkable Neck:  6 cm JVD, no thyromegally Lymphatics:  No adenopathy Back:  No CVA tenderness Lungs:  Clear with no wheezes HEART:  Regular rate rhythm, no murmurs, no rubs, no clicks Abd:  soft, positive bowel sounds, no organomegally, no rebound, no guarding Ext:  2 plus pulses, no edema, no cyanosis, no clubbing, left arm with HD fistula in place Skin:  No rashes no nodules Neuro:  CN II through XII intact, motor grossly intact  EKG - nsr with LBBB  DEVICE  Normal device function.  See PaceArt for details.   Assess/Plan: 1. ICD - his [facility] [employer]  device is working normally. His shocking impedence is up a bit and we will need to follow. Will recheck in several months. Discuss possibility of needing a new lead.  2. Chronic systolic heart failure - his symptoms are well controlled. He is off of coreg. 3. HTN - his blood pressure is well controlled.  4. VT - he has had only NSVT in the past few months. No sustained episodes.  Mikle Bosworth.D.

## 2018-03-10 ENCOUNTER — Other Ambulatory Visit: Payer: Self-pay | Admitting: Nephrology

## 2018-03-10 DIAGNOSIS — N186 End stage renal disease: Secondary | ICD-10-CM

## 2018-03-23 ENCOUNTER — Ambulatory Visit
Admission: RE | Admit: 2018-03-23 | Discharge: 2018-03-23 | Disposition: A | Discharge status: Home / Self Care | Payer: Medicare Other | Source: Ambulatory Visit | Attending: Nephrology | Admitting: Nephrology

## 2018-03-23 DIAGNOSIS — N186 End stage renal disease: Secondary | ICD-10-CM

## 2018-03-26 LAB — CUP PACEART INCLINIC DEVICE CHECK
Implantable Lead Implant Date: 20111118
Implantable Lead Serial Number: 313758
MDC IDC LEAD LOCATION: 753860
MDC IDC PG IMPLANT DT: 20111118
MDC IDC SESS DTM: 20190816151727
Pulse Gen Serial Number: 268731

## 2018-04-07 ENCOUNTER — Encounter: Payer: Self-pay | Admitting: Neurology

## 2018-04-13 ENCOUNTER — Ambulatory Visit (INDEPENDENT_AMBULATORY_CARE_PROVIDER_SITE_OTHER): Payer: Medicare Other | Admitting: Vascular Surgery

## 2018-04-13 ENCOUNTER — Encounter: Payer: Self-pay | Admitting: *Deleted

## 2018-04-13 ENCOUNTER — Other Ambulatory Visit: Payer: Self-pay | Admitting: *Deleted

## 2018-04-13 ENCOUNTER — Encounter: Payer: Self-pay | Admitting: Vascular Surgery

## 2018-04-13 ENCOUNTER — Other Ambulatory Visit: Payer: Self-pay

## 2018-04-13 VITALS — BP 78/70 | HR 84 | Temp 97.0°F | Resp 16 | Ht 68.0 in | Wt 173.0 lb

## 2018-04-13 DIAGNOSIS — Z992 Dependence on renal dialysis: Secondary | ICD-10-CM | POA: Diagnosis not present

## 2018-04-13 DIAGNOSIS — N186 End stage renal disease: Secondary | ICD-10-CM

## 2018-04-13 NOTE — Progress Notes (Addendum)
Patient name: Timothy Faulkner MRN: [id number] DOB: 23-Nov-1945 Sex: male  REASON FOR CONSULT: Ulcerated segment over left radiocephalic fistula with concern for underlying stenosis  HPI: Timothy Faulkner is a 72 y.o. male with end-stage renal disease that presents for evaluation of a left radiocephalic fistula with overlying ulcerated skin.  Patient currently dialyzes Monday Wednesday and Friday.  He was dialyzed on Monday without any issues.  He was seen by Dr. Posey Pronto earlier today at [facility] and there was concern for an ulcerated segment over his radiocephalic fistula.  As a result he was referred to [facility] for referral.  Patient states that this has been a working fistula for him since Dr. early placed in 2017.  Although he has a ulcerated segment over his skin he said no bleeding episodes to date.  Past Medical History:  Diagnosis Date  . Anemia   . Arthritis    Gout  . Automatic implantable cardioverter-defibrillator in situ    [employer]  . CHF   . DIAB W/O COMP TYPE II/UNS NOT STATED UNCNTRL    No medication  . ESRD on dialysis [facility])    [name] 03/11/2013 (03/11/2013) dialysis M/W/F  . ESRD on dialysis ([place])   . GERD (gastroesophageal reflux disease)   . Gout    "once a year"  . Heart murmur   . HYPERCHOLESTEROLEMIA, MIXED   . HYPERTENSION   . Macular degeneration   . 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 Pacemaker   . Peripheral arterial disease (HCC)    left fifth toe ulcer, healing  . Pneumonia   . Shortness of breath   . 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 AV FISTULA PLACEMENT Right 12/13/2012   Procedure: ARTERIOVENOUS (AV) FISTULA CREATION;  Surgeon: [name], MD;  Location: [place];  Service: Vascular;  Laterality: Right;  Ultrasound guided  . AV FISTULA PLACEMENT Left 05/07/2016   Procedure: LEFT RADIOCEPHALIC ARTERIOVENOUS (AV) FISTULA CREATION;   Surgeon: [name], MD;  Location: [place];  Service: Vascular;  Laterality: Left;  . BASCILIC VEIN TRANSPOSITION Right 03/26/2016   Procedure: RIGHT BASILIC VEIN TRANSPOSITION;  Surgeon: [name], MD;  Location: [place];  Service: Vascular;  Laterality: Right;  . CARDIAC CATHETERIZATION    . CARDIAC DEFIBRILLATOR PLACEMENT     Chemical engineer  . EYE SURGERY Bilateral    Cataract  . INCISION AND DRAINAGE ABSCESS / HEMATOMA OF BURSA / KNEE / THIGH Left 2012   "knee" (03/11/2013)  . INSERT / REPLACE / REMOVE PACEMAKER    . REVISON OF ARTERIOVENOUS FISTULA Right 8/76/8115   Procedure: Plication OF Right Arm ARTERIOVENOUS FISTULA;  Surgeon: Elam Dutch, MD;  Location: [place];  Service: Vascular;  Laterality: Right;  . SHUNTOGRAM N/A 05/31/2013   Procedure: Earney Mallet;  Surgeon: [name], MD;  Location: [facility] CATH LAB;  Service: Cardiovascular;  Laterality: N/A;    Family History  Problem Relation Age of Onset  . Heart disease Father   . CAD Father     SOCIAL HISTORY: Social History   Socioeconomic History  . Marital status: Married    Spouse name: Timothy Faulkner  . Number of children: 2  . Years of education: 38  . Highest education level: Not on file  Occupational History  . Not on file  Social Needs  . Financial resource strain:  Not on file  . Food insecurity:    Worry: Not on file    Inability: Not on file  . Transportation needs:    Medical: Not on file    Non-medical: Not on file  Tobacco Use  . Smoking status: Former Smoker    Packs/day: 0.75    Years: 30.00    Pack years: 22.50    Types: Cigarettes    Last attempt to quit: 11/25/1992    Years since quitting: 25.3  . Smokeless tobacco: Never Used  Substance and Sexual Activity  . Alcohol use: No    Comment: have not had a drink 2012  . Drug use: No  . Sexual activity: Yes  Lifestyle  . Physical activity:    Days per week: Not on file    Minutes per session: Not on file  . Stress: Not on file    Relationships  . Social connections:    Talks on phone: Not on file    Gets together: Not on file    Attends religious service: Not on file    Active member of club or organization: Not on file    Attends meetings of clubs or organizations: Not on file    Relationship status: Not on file  . Intimate partner violence:    Fear of current or ex partner: Not on file    Emotionally abused: Not on file    Physically abused: Not on file    Forced sexual activity: Not on file  Other Topics Concern  . Not on file  Social History Narrative   Patient is married Timothy Faulkner and lives with his wife and child   Patient has two children.   Patient is retired.   Patient has a college education.   Patient is right-handed.   Patient drinks very little caffeine.          Allergies  Allergen Reactions  . No Known Allergies     Current Outpatient Medications  Medication Sig Dispense Refill  . aspirin EC 81 MG tablet Take 81 mg by mouth daily.    Lorin Picket 1 GM 210 MG(Fe) tablet Take 420 mg by mouth 2 (two) times daily.     . carvedilol (COREG) 3.125 MG tablet Take one tablet (3.125mg ) by mouth twice daily on non dialysis days (Patient taking differently: Take one tablet (3.125mg ) by mouth as needed daily) 40 tablet 3  . clopidogrel (PLAVIX) 75 MG tablet Take 75 mg by mouth daily.    . colchicine 0.6 MG tablet Take 0.6 mg by mouth daily as needed.  1  . SENSIPAR 30 MG tablet Take 30 mg by mouth daily.     No current facility-administered medications for this visit.     REVIEW OF SYSTEMS:  [X]  denotes positive finding, [ ]  denotes negative finding Cardiac  Comments:  Chest pain or chest pressure:    Shortness of breath upon exertion:    Short of breath when lying flat:    Irregular heart rhythm:        Vascular    Pain in calf, thigh, or hip brought on by ambulation:    Pain in feet at night that wakes you up from your sleep:     Blood clot in your veins:    Leg swelling:          Pulmonary    Oxygen at home:    Productive cough:     Wheezing:  Neurologic    Sudden weakness in arms or legs:     Sudden numbness in arms or legs:     Sudden onset of difficulty speaking or slurred speech:    Temporary loss of vision in one eye:     Problems with dizziness:         Gastrointestinal    Blood in stool:     Vomited blood:         Genitourinary    Burning when urinating:     Blood in urine:        Psychiatric    Major depression:         Hematologic    Bleeding problems:    Problems with blood clotting too easily:        Skin    Rashes or ulcers: x Over radiocephalic AVF      Constitutional    Fever or chills:      PHYSICAL EXAM: Vitals:   04/13/18 1013  BP: (!) 78/70  Pulse: 84  Resp: 16  Temp: (!) 97 F (36.1 C)  TempSrc: Oral  SpO2: 99%  Weight: 78.5 kg  Height: 5\' 8"  (1.727 m)    GENERAL: The patient is a well-nourished male, in no acute distress. The vital signs are documented above. CARDIAC: There is a regular rate and rhythm.  VASCULAR:  Left radiocephalic fistula. There is a palpable thrill in the distal forearm distal to the ulcerated segment of overlying skin. There is no thrill in the proximal forearm but able to appreciate a bruit. PULMONARY: There is good air exchange bilaterally without wheezing or rales. ABDOMEN: Soft and non-tender with normal pitched bowel sounds.  MUSCULOSKELETAL: There are no major deformities or cyanosis. NEUROLOGIC: No focal weakness or paresthesias are detected. SKIN: There are no ulcers or rashes noted. PSYCHIATRIC: The patient has a normal affect.  Assessment/Plan:  I discussed Mr. Fillinger with Dr. Posey Pronto on the phone.  He feels that his fistula has otherwise been working well and likely has been more time if we can salvage it.  Discussed having dialysis access his fistula away from the ulcerated segment for now.  If there are issues with access discussed with Dr. Posey Pronto having [facility]  place a catheter temporarily until we can get him on our schedule for revision.  Will plan for fistulogram with revision at the same time given the ulcerated segment.  He's had a history of inflow stenosis in the past and can appreciate thrill distally but not proximally so suspect this has recurred as well.  He has done well every 6 months after angioplasty.  If this fails will need conversion to left upper arm fistula - has good cephalic vein on old mapping.  Will have him hold Plavix 3 days before.   [name], MD [facility] Office: [phone] Pager: [place]

## 2018-04-20 ENCOUNTER — Ambulatory Visit: Payer: Medicare Other | Admitting: Vascular Surgery

## 2018-04-21 ENCOUNTER — Other Ambulatory Visit: Payer: Self-pay

## 2018-04-21 ENCOUNTER — Encounter (HOSPITAL_COMMUNITY): Payer: Self-pay | Admitting: *Deleted

## 2018-04-21 NOTE — Progress Notes (Signed)
Anesthesia Chart Review: SAME DAY WORKUP   Case:  [id number] Date/Time:  04/22/18 1240   Procedures:      FISTULOGRAM UPPER EXTREMITY (Left )     REVISION OF RADIOCEPHALIC ARTERIOVENOUS FISTULA (Left )     INSERTION OF DIALYSIS CATHETER (N/A )   Anesthesia type:  Choice   Pre-op diagnosis:  end stage renal disease   Location:  [facility] OR ROOM 16 / [place] [facility]   Surgeon:  [name], MD      DISCUSSION: 72 yo male former smoker. Pertinent hx includes CHF, HTN, Anemia, [place] [facility] AICD, SOB, Systolic CHF, PAD (healing left fifth toe ulcer), Gout, ESRD on HD MWF, GERD, DMII.  Pt last seen by cardiology Dr. Lovena Le 7/30/209. Per his notes the pt's CHF was well controlled, BP well controlled. He did have some episodes of NSVT in the past few months.   Per Dr. Ainsley Spinner notes pt to stop Plavix 3d prior to surgery.  Anticipate he can proceed with surgery as planned barring acute status change and DOS lab acceptable.  VS: There were no vitals taken for this visit.  PROVIDERS: [name], MD is PCP  Cristopher Peru, MD is Cardiologist last seen 03/09/2018  LABS: Will need DOS labs  Labs Reviewed - No data to display   IMAGES: CTA Chest 12/23/2016: IMPRESSION: 1. No pulmonary embolus. 2. Multi chamber cardiomegaly with left ventricular hypertrophy. Coronary artery calcifications. 3. Bronchial thickening which appears chronic.  EKG: 03/09/2018: NSR, LAD, LBBB. Review of previous tracings shows widened QRS although LBBB was not called. Dr. Lovena Le noted LBBB in most recent office note.   CV: TTE 03/13/2013: Study Conclusions  - Left ventricle: The cavity size was moderately dilated. Systolic function was severely reduced. The estimated ejection fraction was in the range of 20% to 25%. Wall motion was normal; there were no regional wall motion abnormalities. - Mitral valve: Mild regurgitation. - Left atrium: The atrium was mildly dilated. - Atrial septum: No defect or patent  foramen ovale was identified. - Tricuspid valve: Moderate regurgitation. - Pulmonary arteries: Systolic pressure was moderately increased. PA peak pressure: 44mm Hg (S).  Past Medical History:  Diagnosis Date  . Anemia   . Arthritis    Gout  . Automatic implantable cardioverter-defibrillator in situ    [employer]  . CHF   . DIAB W/O COMP TYPE II/UNS NOT STATED UNCNTRL    No medication  . ESRD on dialysis [facility])    [name] 03/11/2013 (03/11/2013) dialysis M/W/F  . ESRD on dialysis ([place])   . GERD (gastroesophageal reflux disease)   . Gout    "once a year"  . Heart murmur   . HYPERCHOLESTEROLEMIA, MIXED   . HYPERTENSION   . Macular degeneration   . 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 Pacemaker   . Peripheral arterial disease (HCC)    left fifth toe ulcer, healing  . Pneumonia   . Shortness of breath   . 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 AV FISTULA PLACEMENT Right 12/13/2012   Procedure: ARTERIOVENOUS (AV) FISTULA CREATION;  Surgeon: [name], MD;  Location: [place];  Service: Vascular;  Laterality: Right;  Ultrasound guided  . AV FISTULA PLACEMENT Left 05/07/2016   Procedure: LEFT RADIOCEPHALIC ARTERIOVENOUS (AV) FISTULA CREATION;  Surgeon: [name], MD;  Location: [place];  Service: Vascular;  Laterality: Left;  .  BASCILIC VEIN TRANSPOSITION Right 03/26/2016   Procedure: RIGHT BASILIC VEIN TRANSPOSITION;  Surgeon: [name], MD;  Location: [place];  Service: Vascular;  Laterality: Right;  . CARDIAC CATHETERIZATION    . CARDIAC DEFIBRILLATOR PLACEMENT     Chemical engineer  . EYE SURGERY Bilateral    Cataract  . INCISION AND DRAINAGE ABSCESS / HEMATOMA OF BURSA / KNEE / THIGH Left 2012   "knee" (03/11/2013)  . INSERT / REPLACE / REMOVE PACEMAKER    . REVISON OF ARTERIOVENOUS FISTULA Right 8/61/6837   Procedure: Plication OF Right Arm ARTERIOVENOUS  FISTULA;  Surgeon: Elam Dutch, MD;  Location: [place];  Service: Vascular;  Laterality: Right;  . SHUNTOGRAM N/A 05/31/2013   Procedure: Earney Mallet;  Surgeon: [name], MD;  Location: [facility] CATH LAB;  Service: Cardiovascular;  Laterality: N/A;    MEDICATIONS: No current facility-administered medications for this encounter.    Marland Kitchen aspirin EC 81 MG tablet  . AURYXIA 1 GM 210 MG(Fe) tablet  . clopidogrel (PLAVIX) 75 MG tablet  . colchicine 0.6 MG tablet  . lidocaine-prilocaine (EMLA) cream  . SENSIPAR 30 MG tablet  . carvedilol (COREG) 3.125 MG tablet    Wynonia Musty [facility] Short Stay Center/Anesthesiology Phone [phone] 04/21/2018 11:16 AM

## 2018-04-21 NOTE — Progress Notes (Signed)
Pt denies any acute cardiopulmonary issues. Pt under the care of Dr. Cristopher Peru, Cardiology. Peri-op Prescription for ICD [employer] ) was initiated; awaiting a response. Pt denies having a chest x ray within the last year. Pt stated that last dose of Plavix was Saturday. Pt made aware to stop taking vitamins, fish oil and herbal medications. Do not take any NSAIDs ie: Ibuprofen, Advil, Naproxen Aleve, Motrin, BC and Goody Powder. Pt diabetes is diet controlled. Pt made aware to check BG every 2 hours prior to arrival to hospital on DOS. Pt made aware to treat a BG < 70 with 4  ounces of apple or cranberry juice, wait 15 minutes after intervention to recheck BG, if BG remains < 70, call Short Stay unit to speak with a nurse. Pt verbalized understanding of all pre-op instructions. Anesthesia asked to review pt history (see note).

## 2018-04-22 ENCOUNTER — Ambulatory Visit (HOSPITAL_COMMUNITY): Payer: Medicare Other | Admitting: Physician Assistant

## 2018-04-22 ENCOUNTER — Ambulatory Visit (HOSPITAL_COMMUNITY)
Admission: RE | Admit: 2018-04-22 | Discharge: 2018-04-22 | Disposition: A | Discharge status: Home / Self Care | Payer: Medicare Other | Source: Ambulatory Visit | Attending: Vascular Surgery | Admitting: Vascular Surgery

## 2018-04-22 ENCOUNTER — Encounter (HOSPITAL_COMMUNITY): Payer: Self-pay

## 2018-04-22 ENCOUNTER — Ambulatory Visit (INDEPENDENT_AMBULATORY_CARE_PROVIDER_SITE_OTHER): Payer: Medicare Other | Admitting: *Deleted

## 2018-04-22 ENCOUNTER — Ambulatory Visit (HOSPITAL_COMMUNITY): Payer: Medicare Other

## 2018-04-22 ENCOUNTER — Telehealth: Payer: Self-pay | Admitting: Vascular Surgery

## 2018-04-22 ENCOUNTER — Encounter (HOSPITAL_COMMUNITY)
Admission: RE | Disposition: A | Discharge status: Home / Self Care | Payer: Self-pay | Source: Ambulatory Visit | Attending: Vascular Surgery

## 2018-04-22 DIAGNOSIS — E1151 Type 2 diabetes mellitus with diabetic peripheral angiopathy without gangrene: Secondary | ICD-10-CM | POA: Insufficient documentation

## 2018-04-22 DIAGNOSIS — Y832 Surgical operation with anastomosis, bypass or graft as the cause of abnormal reaction of the patient, or of later complication, without mention of misadventure at the time of the procedure: Secondary | ICD-10-CM | POA: Insufficient documentation

## 2018-04-22 DIAGNOSIS — J449 Chronic obstructive pulmonary disease, unspecified: Secondary | ICD-10-CM | POA: Insufficient documentation

## 2018-04-22 DIAGNOSIS — L97529 Non-pressure chronic ulcer of other part of left foot with unspecified severity: Secondary | ICD-10-CM | POA: Diagnosis not present

## 2018-04-22 DIAGNOSIS — N186 End stage renal disease: Secondary | ICD-10-CM | POA: Insufficient documentation

## 2018-04-22 DIAGNOSIS — E11621 Type 2 diabetes mellitus with foot ulcer: Secondary | ICD-10-CM | POA: Diagnosis not present

## 2018-04-22 DIAGNOSIS — E7800 Pure hypercholesterolemia, unspecified: Secondary | ICD-10-CM | POA: Diagnosis not present

## 2018-04-22 DIAGNOSIS — I2510 Atherosclerotic heart disease of native coronary artery without angina pectoris: Secondary | ICD-10-CM | POA: Insufficient documentation

## 2018-04-22 DIAGNOSIS — I132 Hypertensive heart and chronic kidney disease with heart failure and with stage 5 chronic kidney disease, or end stage renal disease: Secondary | ICD-10-CM | POA: Diagnosis not present

## 2018-04-22 DIAGNOSIS — Z992 Dependence on renal dialysis: Secondary | ICD-10-CM

## 2018-04-22 DIAGNOSIS — I447 Left bundle-branch block, unspecified: Secondary | ICD-10-CM | POA: Insufficient documentation

## 2018-04-22 DIAGNOSIS — M109 Gout, unspecified: Secondary | ICD-10-CM | POA: Insufficient documentation

## 2018-04-22 DIAGNOSIS — Z95810 Presence of automatic (implantable) cardiac defibrillator: Secondary | ICD-10-CM | POA: Diagnosis not present

## 2018-04-22 DIAGNOSIS — M1990 Unspecified osteoarthritis, unspecified site: Secondary | ICD-10-CM | POA: Diagnosis not present

## 2018-04-22 DIAGNOSIS — Z7982 Long term (current) use of aspirin: Secondary | ICD-10-CM | POA: Diagnosis not present

## 2018-04-22 DIAGNOSIS — Z87891 Personal history of nicotine dependence: Secondary | ICD-10-CM | POA: Insufficient documentation

## 2018-04-22 DIAGNOSIS — K219 Gastro-esophageal reflux disease without esophagitis: Secondary | ICD-10-CM | POA: Diagnosis not present

## 2018-04-22 DIAGNOSIS — E1122 Type 2 diabetes mellitus with diabetic chronic kidney disease: Secondary | ICD-10-CM | POA: Diagnosis not present

## 2018-04-22 DIAGNOSIS — D631 Anemia in chronic kidney disease: Secondary | ICD-10-CM | POA: Diagnosis not present

## 2018-04-22 DIAGNOSIS — I428 Other cardiomyopathies: Secondary | ICD-10-CM | POA: Diagnosis not present

## 2018-04-22 DIAGNOSIS — I5020 Unspecified systolic (congestive) heart failure: Secondary | ICD-10-CM | POA: Insufficient documentation

## 2018-04-22 DIAGNOSIS — L98499 Non-pressure chronic ulcer of skin of other sites with unspecified severity: Secondary | ICD-10-CM | POA: Insufficient documentation

## 2018-04-22 DIAGNOSIS — Z7902 Long term (current) use of antithrombotics/antiplatelets: Secondary | ICD-10-CM | POA: Diagnosis not present

## 2018-04-22 DIAGNOSIS — T82898A Other specified complication of vascular prosthetic devices, implants and grafts, initial encounter: Secondary | ICD-10-CM | POA: Diagnosis present

## 2018-04-22 HISTORY — PX: REVISON OF ARTERIOVENOUS FISTULA: SHX6074

## 2018-04-22 HISTORY — PX: FISTULOGRAM: SHX5832

## 2018-04-22 HISTORY — PX: INSERTION OF DIALYSIS CATHETER: SHX1324

## 2018-04-22 LAB — POCT I-STAT 4, (NA,K, GLUC, HGB,HCT)
Glucose, Bld: 78 mg/dL (ref 70–99)
HEMATOCRIT: 52 % (ref 39.0–52.0)
HEMOGLOBIN: 17.7 g/dL — AB (ref 13.0–17.0)
POTASSIUM: 4.3 mmol/L (ref 3.5–5.1)
SODIUM: 132 mmol/L — AB (ref 135–145)

## 2018-04-22 LAB — GLUCOSE, CAPILLARY
GLUCOSE-CAPILLARY: 77 mg/dL (ref 70–99)
Glucose-Capillary: 77 mg/dL (ref 70–99)
Glucose-Capillary: 71 mg/dL (ref 70–99)
Glucose-Capillary: 71 mg/dL (ref 70–99)

## 2018-04-22 SURGERY — FISTULOGRAM
Anesthesia: Monitor Anesthesia Care | Laterality: Left

## 2018-04-22 MED ORDER — SODIUM CHLORIDE 0.9 % IV SOLN
INTRAVENOUS | Status: DC
  Administered 2018-04-22 (×3): via INTRAVENOUS

## 2018-04-22 MED ORDER — ONDANSETRON HCL 4 MG/2ML IJ SOLN
INTRAMUSCULAR | Status: DC | PRN
  Administered 2018-04-22: 4 mg via INTRAVENOUS

## 2018-04-22 MED ORDER — OXYCODONE-ACETAMINOPHEN 5-325 MG PO TABS: 1.0000 | ORAL_TABLET | Freq: Four times a day (QID) | ORAL | 0 refills | Status: DC | PRN

## 2018-04-22 MED ORDER — IODIXANOL 320 MG/ML IV SOLN
INTRAVENOUS | Status: DC | PRN
  Administered 2018-04-22: 35 mL via INTRA_ARTERIAL

## 2018-04-22 MED ORDER — 0.9 % SODIUM CHLORIDE (POUR BTL) OPTIME
TOPICAL | Status: DC | PRN
  Administered 2018-04-22: 1000 mL

## 2018-04-22 MED ORDER — PROMETHAZINE HCL 25 MG/ML IJ SOLN: 6.2500 mg | INTRAMUSCULAR | Status: DC | PRN

## 2018-04-22 MED ORDER — MIDAZOLAM HCL 2 MG/2ML IJ SOLN: 0.5000 mg | Freq: Once | INTRAMUSCULAR | Status: DC | PRN

## 2018-04-22 MED ORDER — SODIUM CHLORIDE 0.9 % IV SOLN
INTRAVENOUS | Status: AC
  Filled 2018-04-22: qty 1.2

## 2018-04-22 MED ORDER — HEPARIN SODIUM (PORCINE) 1000 UNIT/ML IJ SOLN
INTRAMUSCULAR | Status: DC | PRN
  Administered 2018-04-22: 3400 [IU]

## 2018-04-22 MED ORDER — FENTANYL CITRATE (PF) 100 MCG/2ML IJ SOLN: 25.0000 ug | INTRAMUSCULAR | Status: DC | PRN

## 2018-04-22 MED ORDER — FENTANYL CITRATE (PF) 250 MCG/5ML IJ SOLN
INTRAMUSCULAR | Status: AC
  Filled 2018-04-22: qty 5

## 2018-04-22 MED ORDER — CEFAZOLIN SODIUM-DEXTROSE 2-4 GM/100ML-% IV SOLN
INTRAVENOUS | Status: AC
  Filled 2018-04-22: qty 100

## 2018-04-22 MED ORDER — MIDAZOLAM HCL 5 MG/5ML IJ SOLN
INTRAMUSCULAR | Status: DC | PRN
  Administered 2018-04-22 (×2): 1 mg via INTRAVENOUS

## 2018-04-22 MED ORDER — HEPARIN SODIUM (PORCINE) 1000 UNIT/ML IJ SOLN
INTRAMUSCULAR | Status: DC | PRN
  Administered 2018-04-22: 3000 [IU] via INTRAVENOUS

## 2018-04-22 MED ORDER — PROTAMINE SULFATE 10 MG/ML IV SOLN
INTRAVENOUS | Status: DC | PRN
  Administered 2018-04-22: 10 mg via INTRAVENOUS

## 2018-04-22 MED ORDER — PROPOFOL 500 MG/50ML IV EMUL
INTRAVENOUS | Status: DC | PRN
  Administered 2018-04-22: 15 ug/kg/min via INTRAVENOUS

## 2018-04-22 MED ORDER — SODIUM CHLORIDE 0.9 % IV SOLN
INTRAVENOUS | Status: DC | PRN
  Administered 2018-04-22: 14:00:00

## 2018-04-22 MED ORDER — FENTANYL CITRATE (PF) 100 MCG/2ML IJ SOLN
INTRAMUSCULAR | Status: DC | PRN
  Administered 2018-04-22 (×5): 25 ug via INTRAVENOUS
  Administered 2018-04-22: 50 ug via INTRAVENOUS
  Administered 2018-04-22 (×2): 25 ug via INTRAVENOUS

## 2018-04-22 MED ORDER — ETOMIDATE 2 MG/ML IV SOLN
INTRAVENOUS | Status: DC | PRN
  Administered 2018-04-22 (×2): 4 mg via INTRAVENOUS

## 2018-04-22 MED ORDER — CEFAZOLIN SODIUM-DEXTROSE 2-4 GM/100ML-% IV SOLN
2.0000 g | INTRAVENOUS | Status: AC
  Administered 2018-04-22: 2 g via INTRAVENOUS

## 2018-04-22 MED ORDER — LIDOCAINE-EPINEPHRINE (PF) 1 %-1:200000 IJ SOLN
INTRAMUSCULAR | Status: AC
  Filled 2018-04-22: qty 30

## 2018-04-22 MED ORDER — MEPERIDINE HCL 50 MG/ML IJ SOLN: 6.2500 mg | INTRAMUSCULAR | Status: DC | PRN

## 2018-04-22 MED ORDER — SODIUM CHLORIDE 0.9 % IV SOLN
INTRAVENOUS | Status: DC | PRN
  Administered 2018-04-22: 25 ug/min via INTRAVENOUS

## 2018-04-22 MED ORDER — PROPOFOL 10 MG/ML IV BOLUS
INTRAVENOUS | Status: AC
  Filled 2018-04-22: qty 20

## 2018-04-22 MED ORDER — SODIUM CHLORIDE 0.9 % IV SOLN: INTRAVENOUS | Status: DC

## 2018-04-22 MED ORDER — MIDAZOLAM HCL 2 MG/2ML IJ SOLN
INTRAMUSCULAR | Status: AC
  Filled 2018-04-22: qty 2

## 2018-04-22 MED ORDER — LIDOCAINE-EPINEPHRINE (PF) 1 %-1:200000 IJ SOLN
INTRAMUSCULAR | Status: DC | PRN
  Administered 2018-04-22: 20 mL

## 2018-04-22 MED ORDER — ONDANSETRON HCL 4 MG/2ML IJ SOLN
INTRAMUSCULAR | Status: AC
  Filled 2018-04-22: qty 2

## 2018-04-22 SURGICAL SUPPLY — 79 items
ADH SKN CLS APL DERMABOND .7 (GAUZE/BANDAGES/DRESSINGS) ×2
AGENT HMST SPONGE THK3/8 (HEMOSTASIS)
ARMBAND PINK RESTRICT EXTREMIT (MISCELLANEOUS) ×3 IMPLANT
BAG BANDED W/RUBBER/TAPE 36X54 (MISCELLANEOUS) ×3 IMPLANT
BAG DECANTER FOR FLEXI CONT (MISCELLANEOUS) ×3 IMPLANT
BAG EQP BAND 135X91 W/RBR TAPE (MISCELLANEOUS) ×1
BAG SNAP BAND KOVER 36X36 (MISCELLANEOUS) ×2 IMPLANT
BIOPATCH RED 1 DISK 7.0 (GAUZE/BANDAGES/DRESSINGS) ×2 IMPLANT
BIOPATCH RED 1IN DISK 7.0MM (GAUZE/BANDAGES/DRESSINGS) ×1
BLADE SURG 11 STRL SS (BLADE) ×3 IMPLANT
CANISTER SUCT 3000ML PPV (MISCELLANEOUS) ×3 IMPLANT
CATH PALINDROME RT-P 15FX19CM (CATHETERS) IMPLANT
CATH PALINDROME RT-P 15FX23CM (CATHETERS) ×2 IMPLANT
CATH PALINDROME RT-P 15FX28CM (CATHETERS) IMPLANT
CATH PALINDROME RT-P 15FX55CM (CATHETERS) IMPLANT
CATH STRAIGHT 5FR 65CM (CATHETERS) IMPLANT
CLIP VESOCCLUDE MED 6/CT (CLIP) ×3 IMPLANT
CLIP VESOCCLUDE SM WIDE 6/CT (CLIP) ×5 IMPLANT
COVER DOME SNAP 22 D (MISCELLANEOUS) ×7 IMPLANT
COVER MAYO STAND STRL (DRAPES) ×2 IMPLANT
COVER PROBE W GEL 5X96 (DRAPES) ×3 IMPLANT
COVER SURGICAL LIGHT HANDLE (MISCELLANEOUS) ×3 IMPLANT
DECANTER SPIKE VIAL GLASS SM (MISCELLANEOUS) ×3 IMPLANT
DERMABOND ADVANCED (GAUZE/BANDAGES/DRESSINGS) ×4
DERMABOND ADVANCED .7 DNX12 (GAUZE/BANDAGES/DRESSINGS) ×1 IMPLANT
DRAPE C-ARM 42X72 X-RAY (DRAPES) ×3 IMPLANT
DRAPE CHEST BREAST 15X10 FENES (DRAPES) ×3 IMPLANT
DRSG COVADERM 4X6 (GAUZE/BANDAGES/DRESSINGS) ×2 IMPLANT
DRSG TEGADERM 4X4.75 (GAUZE/BANDAGES/DRESSINGS) ×2 IMPLANT
ELECT REM PT RETURN 9FT ADLT (ELECTROSURGICAL) ×3
ELECTRODE REM PT RTRN 9FT ADLT (ELECTROSURGICAL) ×1 IMPLANT
GAUZE 4X4 16PLY RFD (DISPOSABLE) ×3 IMPLANT
GLOVE BIO SURGEON STRL SZ7.5 (GLOVE) ×3 IMPLANT
GLOVE BIOGEL PI IND STRL 8 (GLOVE) ×1 IMPLANT
GLOVE BIOGEL PI INDICATOR 8 (GLOVE) ×2
GOWN STRL REUS W/ TWL LRG LVL3 (GOWN DISPOSABLE) ×3 IMPLANT
GOWN STRL REUS W/ TWL XL LVL3 (GOWN DISPOSABLE) ×2 IMPLANT
GOWN STRL REUS W/TWL LRG LVL3 (GOWN DISPOSABLE) ×9
GOWN STRL REUS W/TWL XL LVL3 (GOWN DISPOSABLE) ×6
HEMOSTAT SPONGE AVITENE ULTRA (HEMOSTASIS) IMPLANT
KIT BASIN OR (CUSTOM PROCEDURE TRAY) ×3 IMPLANT
KIT ENCORE 26 ADVANTAGE (KITS) IMPLANT
KIT TURNOVER KIT B (KITS) ×3 IMPLANT
NDL 18GX1X1/2 (RX/OR ONLY) (NEEDLE) ×1 IMPLANT
NDL HYPO 25GX1X1/2 BEV (NEEDLE) ×1 IMPLANT
NDL PERC 18GX7CM (NEEDLE) ×1 IMPLANT
NEEDLE 18GX1X1/2 (RX/OR ONLY) (NEEDLE) ×3 IMPLANT
NEEDLE HYPO 25GX1X1/2 BEV (NEEDLE) ×3 IMPLANT
NEEDLE PERC 18GX7CM (NEEDLE) ×3 IMPLANT
NS IRRIG 1000ML POUR BTL (IV SOLUTION) ×3 IMPLANT
PACK CV ACCESS (CUSTOM PROCEDURE TRAY) ×3 IMPLANT
PACK SURGICAL SETUP 50X90 (CUSTOM PROCEDURE TRAY) ×3 IMPLANT
PAD ARMBOARD 7.5X6 YLW CONV (MISCELLANEOUS) ×6 IMPLANT
PROTECTION STATION PRESSURIZED (MISCELLANEOUS) ×3
SET MICROPUNCTURE 5F STIFF (MISCELLANEOUS) ×5 IMPLANT
SHEATH AVANTI 11CM 5FR (SHEATH) IMPLANT
SOAP 2 % CHG 4 OZ (WOUND CARE) ×3 IMPLANT
STAPLER VISISTAT 35W (STAPLE) IMPLANT
STATION PROTECTION PRESSURIZED (MISCELLANEOUS) IMPLANT
STOPCOCK 4 WAY LG BORE MALE ST (IV SETS) ×2 IMPLANT
STOPCOCK MORSE 400PSI 3WAY (MISCELLANEOUS) ×3 IMPLANT
SUT ETHILON 3 0 PS 1 (SUTURE) ×9 IMPLANT
SUT MNCRL AB 4-0 PS2 18 (SUTURE) ×7 IMPLANT
SUT PROLENE 5 0 C 1 24 (SUTURE) IMPLANT
SUT PROLENE 6 0 BV (SUTURE) IMPLANT
SUT PROLENE 7 0 BV 1 (SUTURE) IMPLANT
SUT VIC AB 3-0 SH 27 (SUTURE) ×6
SUT VIC AB 3-0 SH 27X BRD (SUTURE) ×1 IMPLANT
SYR 10ML LL (SYRINGE) ×5 IMPLANT
SYR 20CC LL (SYRINGE) ×6 IMPLANT
SYR 5ML LL (SYRINGE) ×3 IMPLANT
SYR CONTROL 10ML LL (SYRINGE) ×5 IMPLANT
TOWEL GREEN STERILE (TOWEL DISPOSABLE) ×3 IMPLANT
TOWEL GREEN STERILE FF (TOWEL DISPOSABLE) ×3 IMPLANT
TUBING CIL FLEX 10 FLL-RA (TUBING) ×5 IMPLANT
UNDERPAD 30X30 (UNDERPADS AND DIAPERS) ×3 IMPLANT
WATER STERILE IRR 1000ML POUR (IV SOLUTION) ×3 IMPLANT
WIRE AMPLATZ SS-J .035X180CM (WIRE) IMPLANT
WIRE BENTSON .035X145CM (WIRE) ×2 IMPLANT

## 2018-04-22 NOTE — H&P (Signed)
History and Physical Interval Note:  04/22/2018 12:17 PM  Timothy Faulkner  has presented today for surgery, with the diagnosis of end stage renal disease  The various methods of treatment have been discussed with the patient and family. After consideration of risks, benefits and other options for treatment, the patient has consented to  Procedure(s): FISTULOGRAM UPPER EXTREMITY (Left) REVISION OF RADIOCEPHALIC ARTERIOVENOUS FISTULA (Left) INSERTION OF DIALYSIS CATHETER (N/A) as a surgical intervention .  The patient's history has been reviewed, patient examined, no change in status, stable for surgery.  I have reviewed the patient's chart and labs.  Questions were answered to the patient's satisfaction.    Left upper extremity fistulogram, possible revision, possible catheter.  Timothy Faulkner  Patient name: Timothy Faulkner         MRN: [id number]        DOB: 19-Feb-1946          Sex: male  REASON FOR CONSULT: Ulcerated segment over left radiocephalic fistula with concern for underlying stenosis  HPI: Timothy Faulkner is a 72 y.o. male with end-stage renal disease that presents for evaluation of a left radiocephalic fistula with overlying ulcerated skin.  Patient currently dialyzes Monday Wednesday and Friday.  He was dialyzed on Monday without any issues.  He was seen by Timothy Faulkner earlier today at [facility] and there was concern for an ulcerated segment over his radiocephalic fistula.  As a result he was referred to VVS for referral.  Patient states that this has been a working fistula for him since Dr. early placed in 2017.  Although he has a ulcerated segment over his skin he said no bleeding episodes to date.      Past Medical History:  Diagnosis Date  . Anemia   . Arthritis    Gout  . Automatic implantable cardioverter-defibrillator in situ    [employer]  . CHF   . DIAB W/O COMP TYPE II/UNS NOT STATED UNCNTRL    No medication  . ESRD on dialysis [facility])     Timothy Faulkner 03/11/2013 (03/11/2013) dialysis M/W/F  . ESRD on dialysis ([place])   . GERD (gastroesophageal reflux disease)   . Gout    "once a year"  . Heart murmur   . HYPERCHOLESTEROLEMIA, MIXED   . HYPERTENSION   . Macular degeneration   . 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 Pacemaker   . Peripheral arterial disease (HCC)    left fifth toe ulcer, healing  . Pneumonia   . Shortness of breath   . 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 AV FISTULA PLACEMENT Right 12/13/2012   Procedure: ARTERIOVENOUS (AV) FISTULA CREATION;  Surgeon: Timothy Posner, MD;  Location: [place];  Service: Vascular;  Laterality: Right;  Ultrasound guided  . AV FISTULA PLACEMENT Left 05/07/2016   Procedure: LEFT RADIOCEPHALIC ARTERIOVENOUS (AV) FISTULA CREATION;  Surgeon: Timothy Posner, MD;  Location: [place];  Service: Vascular;  Laterality: Left;  . BASCILIC VEIN TRANSPOSITION Right 03/26/2016   Procedure: RIGHT BASILIC VEIN TRANSPOSITION;  Surgeon: Timothy Posner, MD;  Location: [place];  Service: Vascular;  Laterality: Right;  . CARDIAC CATHETERIZATION    . CARDIAC DEFIBRILLATOR PLACEMENT     Timothy Faulkner  . EYE SURGERY Bilateral    Cataract  . INCISION AND DRAINAGE ABSCESS / HEMATOMA OF BURSA /  KNEE / THIGH Left 2012   "knee" (03/11/2013)  . INSERT / REPLACE / REMOVE PACEMAKER    . REVISON OF ARTERIOVENOUS FISTULA Right 11/20/8784   Procedure: Plication OF Right Arm ARTERIOVENOUS FISTULA;  Surgeon: [place], MD;  Location: [place];  Service: Vascular;  Laterality: Right;  . SHUNTOGRAM N/A 05/31/2013   Procedure: Timothy Faulkner;  Surgeon: [place], MD;  Location: [facility] CATH LAB;  Service: Cardiovascular;  Laterality: N/A;         Family History  Problem Relation Age of Onset  . Heart disease Father   . CAD Father     SOCIAL HISTORY: Social History         Socioeconomic History  . Marital status: Married    Spouse name: Timothy Faulkner  . Number of children: 2  . Years of education: 65  . Highest education level: Not on file  Occupational History  . Not on file  Social Needs  . Financial resource strain: Not on file  . Food insecurity:    Worry: Not on file    Inability: Not on file  . Transportation needs:    Medical: Not on file    Non-medical: Not on file  Tobacco Use  . Smoking status: Former Smoker    Packs/day: 0.75    Years: 30.00    Pack years: 22.50    Types: Cigarettes    Last attempt to quit: 11/25/1992    Years since quitting: 25.3  . Smokeless tobacco: Never Used  Substance and Sexual Activity  . Alcohol use: No    Comment: have not had a drink 2012  . Drug use: No  . Sexual activity: Yes  Lifestyle  . Physical activity:    Days per week: Not on file    Minutes per session: Not on file  . Stress: Not on file  Relationships  . Social connections:    Talks on phone: Not on file    Gets together: Not on file    Attends religious service: Not on file    Active member of club or organization: Not on file    Attends meetings of clubs or organizations: Not on file    Relationship status: Not on file  . Intimate partner violence:    Fear of current or ex partner: Not on file    Emotionally abused: Not on file    Physically abused: Not on file    Forced sexual activity: Not on file  Other Topics Concern  . Not on file  Social History Narrative   Patient is married Timothy Faulkner and lives with his wife and child   Patient has two children.   Patient is retired.   Patient has a college education.   Patient is right-handed.   Patient drinks very little caffeine.              Allergies  Allergen Reactions  . No Known Allergies           Current Outpatient Medications  Medication Sig Dispense Refill  . aspirin EC 81 MG tablet Take 81 mg by mouth  daily.    Timothy Faulkner 1 GM 210 MG(Fe) tablet Take 420 mg by mouth 2 (two) times daily.     . carvedilol (COREG) 3.125 MG tablet Take one tablet (3.125mg ) by mouth twice daily on non dialysis days (Patient taking differently: Take one tablet (3.125mg ) by mouth as needed daily) 40 tablet 3  . clopidogrel (PLAVIX) 75 MG tablet Take 75  mg by mouth daily.    . colchicine 0.6 MG tablet Take 0.6 mg by mouth daily as needed.  1  . SENSIPAR 30 MG tablet Take 30 mg by mouth daily.     No current facility-administered medications for this visit.     REVIEW OF SYSTEMS:  [X]  denotes positive finding, [ ]  denotes negative finding Cardiac  Comments:  Chest pain or chest pressure:    Shortness of breath upon exertion:    Short of breath when lying flat:    Irregular heart rhythm:        Vascular    Pain in calf, thigh, or hip brought on by ambulation:    Pain in feet at night that wakes you up from your sleep:     Blood clot in your veins:    Leg swelling:         Pulmonary    Oxygen at home:    Productive cough:     Wheezing:         Neurologic    Sudden weakness in arms or legs:     Sudden numbness in arms or legs:     Sudden onset of difficulty speaking or slurred speech:    Temporary loss of vision in one eye:     Problems with dizziness:         Gastrointestinal    Blood in stool:     Vomited blood:         Genitourinary    Burning when urinating:     Blood in urine:        Psychiatric    Major depression:         Hematologic    Bleeding problems:    Problems with blood clotting too easily:        Skin    Rashes or ulcers: x Over radiocephalic AVF      Constitutional    Fever or chills:      PHYSICAL EXAM:    Vitals:   04/13/18 1013  BP: (!) 78/70  Pulse: 84  Resp: 16  Temp: (!) 97 F (36.1 C)  TempSrc: Oral  SpO2: 99%  Weight: 78.5 kg  Height: 5\' 8"   (1.727 m)    GENERAL: The patient is a well-nourished male, in no acute distress. The vital signs are documented above. CARDIAC: There is a regular rate and rhythm.  VASCULAR:  Left radiocephalic fistula. There is a palpable thrill in the distal forearm distal to the ulcerated segment of overlying skin. There is no thrill in the proximal forearm but able to appreciate a bruit. PULMONARY: There is good air exchange bilaterally without wheezing or rales. ABDOMEN: Soft and non-tender with normal pitched bowel sounds.  MUSCULOSKELETAL: There are no major deformities or cyanosis. NEUROLOGIC: No focal weakness or paresthesias are detected. SKIN: There are no ulcers or rashes noted. PSYCHIATRIC: The patient has a normal affect.  Assessment/Plan:  I discussed Mr. Spiller with Timothy Faulkner on the phone.  He feels that his fistula has otherwise been working well and likely has been more time if we can salvage it.  Discussed having dialysis access his fistula away from the ulcerated segment for now.  If there are issues with access discussed with Timothy Faulkner having [facility] place a catheter temporarily until we can get him on our schedule for revision.  Will plan for fistulogram with revision at the same time given the ulcerated segment.  He's had a history of inflow  stenosis in the past and can appreciate thrill distally but not proximally so suspect this has recurred as well.  He has done well every 6 months after angioplasty.  If this fails will need conversion to left upper arm fistula - has good cephalic vein on old mapping.  Will have him hold Plavix 3 days before.   Timothy Heck, MD [facility] [place] Office: [phone] Pager: [phone]

## 2018-04-22 NOTE — Op Note (Signed)
Date: April 22, 2018  Preoperative diagnosis: Ulceration over left radiocephalic fistula in the forearm with high bleeding risk  Postoperative diagnosis: Same  Procedure: 1.  Ultrasound guided access of the left radiocephalic fistula with left upper extremity fistulogram and interpretation 2.  Ultrasound-guided access of the left internal jugular vein 3.  Left IJ tunneled dialysis catheter placement (23 cm Palindrome) 4.  Left brachiocephalic arteriovenous fistula placement 5.  Ligation of left radiocephalic fistula at the wrist  Surgeon: Dr. [name], MD  Assistant: Liana Crocker, PA  Indications: Patient is a 72 year old male with end-stage renal disease that is currently dialyzing via a left radiocephalic fistula.  He was recently referred to clinic by nephrology given concern for a new ulcerated segment overlying his radiocephalic fistula in the left forearm.  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  Findings: Left upper extremity fistulogram shows a dilated cephalic vein distally up to the mid forearm but then the cephalic vein become small in the proximal forearm and there is a medial branch to the deep brachial system that appears to be the dominant outflow of the fistula.  Cephalic vein was patent in the upper arm and emptied into a patent axillary vein with no evidence of central stenosis.  Complications: None  Details: The patient was taken to the operating room after informed consent was obtained.  He was placed on operative table in supine position.  Timeout was performed to identify patient, procedure, site.  We prepped and draped his neck as well as his left arm in standard sterile fashion.  We initially accessed his left radiocephalic fistula at the wrist under ultrasound guidance with a micro access sheath.  We then obtained  hand injected fistulogram of the left upper extremity including evaluating the central runoff.  Most notably the patient had a nice cephalic vein in the distal forearm but then it became much smaller when there was a competing branch that drained medially into the deep system.  There was no evidence of central stenosis.  My concern was that an open revision would require a Artegraft to be tunneled around the ulcerated segment of skin and I would be tunneling this to a small cephalic vein.  As a result I elected to ligate his radiocephalic fistula after placing a central dialysis catheter with plans for new fistula placement. I initially evaluated his right IJ with ultrasound guidance and placed a 18-gauge needle.  Ultimately I could not get the J-wire to thread centrally and met resistance suggesting a central venous occlusion moreover the IJ was small when accessed with ultrasound.  I then accessed the left IJ under ultrasound guidance using an 18 gauge needle and threaded a 3 J-wire into his right atrium.  I then made a counterincision on his chest wall just medial to his existing pacemaker.  I then tunneled from the incision on the chest wall to the IJ stick site in his neck and then tunneled our 23 cm palindrome catheter that had been flushed.  I then used serial dilators and placed a large peel away sheath in the left IJ.  Wires were then removed and the Palindrome catheter was placed through the sheath under fluoroscopic guidance and the sheath peeled away.  All the ports were then flushed and irrigated appropriately and then filled with heparin.  I then closed the IJ  stick site with a interrupted 4-0 subcuticular Monocryl.  The catheter was secured in place with a 3-0 nylon and Dermabond. I then turned my attention to the left arm where initially using ultrasound guidance I identified the brachial artery below the antecubitum as well as the cephalic vein.  Ultimately I made a transverse incision in the  proximal forearm below the antecubitum and dissected out both the cephalic vein and the brachial artery.  Once we had enough length of vein mobilized the vein was then marked for orientation and transposed across the forearm.  I reset my exposure on the brachial artery and after the patient was given 3000 units IV heparin pulled up on the vessel loops for proximal and distal control.  The artery was opened in longitudinal fashion with 11 blade scalpel and Potts scissors.  I then performed an end-to-side cephalic vein to brachial artery anastomosis with a running 6-0 Prolene.  I did de-air the anastomosis prior to completing the anastomosis.  There was a nice thrill in the fistula.  I went down and checked the ulcerated segment in his forearm and there was still a pulse in the segment of ulcerated skin.  As result I cut down on the distal left forearm and found the cephalic vein and tied it off with a 2-0 silk.  At this point in time there was no thrill or pulse underneath the ulcerated segment in the forearm.  All the incisions were then irrigated with saline until the effluent was clear.  Subcutaneous tissue was closed with 3-0 Vicryl.  Skin was closed with 4-0 Monocryl.  He was awakened from anesthesia and taken to PACU in stable condition.  Anesthesia: Monitored anesthesia care  Condition: Stable   [name], MD [facility] Office: [phone] Pager: [place]

## 2018-04-22 NOTE — Anesthesia Preprocedure Evaluation (Addendum)
Anesthesia Evaluation  Patient identified by MRN, date of birth, ID band Patient awake    Reviewed: Allergy & Precautions, NPO status , Patient's Chart, lab work & pertinent test results  History of Anesthesia Complications Negative for: history of anesthetic complications  Airway Mallampati: II  TM Distance: >3 FB Neck ROM: Full    Dental  (+) Upper Dentures, Edentulous Upper, Poor Dentition, Missing, Chipped, Dental Advisory Given   Pulmonary COPD, former smoker,    breath sounds clear to auscultation       Cardiovascular hypertension, (-) angina+ Peripheral Vascular Disease  + pacemaker + Cardiac Defibrillator (last shock a year ago)  Rhythm:Regular Rate:Normal     Neuro/Psych negative neurological ROS     GI/Hepatic negative GI ROS, Neg liver ROS,   Endo/Other  diabetes (glu 71)  Renal/GU Dialysis and ESRFRenal disease (MWF, K+ 4.3)     Musculoskeletal  (+) Arthritis ,   Abdominal   Peds  Hematology negative hematology ROS (+)   Anesthesia Other Findings   Reproductive/Obstetrics                             Anesthesia Physical Anesthesia Plan  ASA: III  Anesthesia Plan: MAC   Post-op Pain Management:    Induction:   PONV Risk Score and Plan: 1 and Ondansetron and Treatment may vary due to age or medical condition  Airway Management Planned: Natural Airway and Nasal Cannula  Additional Equipment:   Intra-op Plan:   Post-operative Plan:   Informed Consent: I have reviewed the patients History and Physical, chart, labs and discussed the procedure including the risks, benefits and alternatives for the proposed anesthesia with the patient or authorized representative who has indicated his/her understanding and acceptance.   Dental advisory given  Plan Discussed with: CRNA and Surgeon  Anesthesia Plan Comments: (Plan routine monitors, MAC)        Anesthesia Quick  Evaluation

## 2018-04-22 NOTE — Discharge Instructions (Signed)
° °  [facility]  Discharge Instructions  AV Fistula or Graft Surgery for Dialysis Access  Please refer to the following instructions for your post-procedure care. Your surgeon or physician assistant will discuss any changes with you.  Activity  You may drive the day following your surgery, if you are comfortable and no longer taking prescription pain medication. Resume full activity as the soreness in your incision resolves.  Bathing/Showering  You may shower after you go home. Keep your incision dry for 48 hours. Do not soak in a bathtub, hot tub, or swim until the incision heals completely. You may not shower if you have a hemodialysis catheter.  Incision Care  Clean your incision with mild soap and water after 48 hours. Pat the area dry with a clean towel. You do not need a bandage unless otherwise instructed. Do not apply any ointments or creams to your incision. You may have skin glue on your incision. Do not peel it off. It will come off on its own in about one week. Your arm may swell a bit after surgery. To reduce swelling use pillows to elevate your arm so it is above your heart. Your doctor will tell you if you need to lightly wrap your arm with an ACE bandage.  Diet  Resume your normal diet. There are not special food restrictions following this procedure. In order to heal from your surgery, it is CRITICAL to get adequate nutrition. Your body requires vitamins, minerals, and protein. Vegetables are the best source of vitamins and minerals. Vegetables also provide the perfect balance of protein. Processed food has little nutritional value, so try to avoid this.  Medications  Resume taking all of your medications. If your incision is causing pain, you may take over-the counter pain relievers such as acetaminophen (Tylenol). If you were prescribed a stronger pain medication, please be aware these medications can cause nausea and constipation. Prevent  nausea by taking the medication with a snack or meal. Avoid constipation by drinking plenty of fluids and eating foods with high amount of fiber, such as fruits, vegetables, and grains.  Do not take Tylenol if you are taking prescription pain medications.  Follow up Your surgeon may want to see you in the office following your access surgery. If so, this will be arranged at the time of your surgery.  Please call us immediately for any of the following conditions:  Increased pain, redness, drainage (pus) from your incision site Fever of 101 degrees or higher Severe or worsening pain at your incision site Hand pain or numbness.  Reduce your risk of vascular disease:  Stop smoking. If you would like help, call [facility] at 1-800-QUIT-NOW 9125983825) or [place] at [place] your cholesterol Maintain a desired weight Control your diabetes Keep your blood pressure down  Dialysis  It will take several weeks to several months for your new dialysis access to be ready for use. Your surgeon will determine when it is okay to use it. Your nephrologist will continue to direct your dialysis. You can continue to use your Permcath until your new access is ready for use.   04/22/2018 Timothy Faulkner [id number] 1946-06-22  Surgeon(s): [name], MD  Procedure(s): FISTULOGRAM UPPER EXTREMITY INSERTION OF DIALYSIS CATHETER Creation Left brachial cephalic AV fistula  x Do not stick fistula for 12 weeks    If you have any questions, please call the office at [phone].

## 2018-04-22 NOTE — Anesthesia Postprocedure Evaluation (Signed)
Anesthesia Post Note  Patient: Timothy Faulkner  Procedure(s) Performed: FISTULOGRAM UPPER EXTREMITY (Left ) REVISION OF RADIOCEPHALIC ARTERIOVENOUS FISTULA (Left ) INSERTION OF DIALYSIS CATHETER (Left )     Patient location during evaluation: PACU Anesthesia Type: MAC Level of consciousness: awake and alert and oriented Pain management: pain level controlled Vital Signs Assessment: post-procedure vital signs reviewed and stable Respiratory status: spontaneous breathing, nonlabored ventilation and respiratory function stable Cardiovascular status: stable and blood pressure returned to baseline Postop Assessment: no apparent nausea or vomiting Anesthetic complications: no    Last Vitals:  Vitals:   04/22/18 1600 04/22/18 1615  BP: 124/78 117/79  Pulse: 80 83  Resp: 20 20  Temp:    SpO2: 100% 100%    Last Pain:  Vitals:   04/22/18 1615  TempSrc:   PainSc: 0-No pain                 Cylan Borum A.

## 2018-04-22 NOTE — Telephone Encounter (Signed)
sch appt spk to to pt wife 05/18/18 9am Dialysis Duplex 945am p/o MD

## 2018-04-22 NOTE — Progress Notes (Signed)
Call to Dr. Jenita Seashore, anesth., reported blood sugar on fingerstick & Istat. Pt. Reports feeling fine & denies all S&S of hypoglycemia. No new orders given.

## 2018-04-22 NOTE — Progress Notes (Signed)
Remote ICD transmission.   

## 2018-04-22 NOTE — Transfer of Care (Signed)
Immediate Anesthesia Transfer of Care Note  Patient: Timothy Faulkner  Procedure(s) Performed: FISTULOGRAM UPPER EXTREMITY (Left ) REVISION OF RADIOCEPHALIC ARTERIOVENOUS FISTULA (Left ) INSERTION OF DIALYSIS CATHETER (Left )  Patient Location: PACU  Anesthesia Type:MAC  Level of Consciousness: awake, alert  and oriented  Airway & Oxygen Therapy: Patient connected to nasal cannula oxygen  Post-op Assessment: Post -op Vital signs reviewed and stable  Post vital signs: stable  Last Vitals:  Vitals Value Taken Time  BP 123/88 04/22/2018  3:28 PM  Temp    Pulse 83 04/22/2018  3:29 PM  Resp 17 04/22/2018  3:29 PM  SpO2 100 % 04/22/2018  3:29 PM  Vitals shown include unvalidated device data.  Last Pain:  Vitals:   04/22/18 1105  TempSrc:   PainSc: 0-No pain         Complications: No apparent anesthesia complications

## 2018-04-23 ENCOUNTER — Encounter (HOSPITAL_COMMUNITY): Payer: Self-pay | Admitting: Vascular Surgery

## 2018-04-23 ENCOUNTER — Other Ambulatory Visit: Payer: Self-pay

## 2018-04-23 DIAGNOSIS — N186 End stage renal disease: Secondary | ICD-10-CM

## 2018-04-26 ENCOUNTER — Telehealth: Payer: Self-pay | Admitting: Internal Medicine

## 2018-04-26 ENCOUNTER — Encounter: Payer: Self-pay | Admitting: Internal Medicine

## 2018-04-26 NOTE — Telephone Encounter (Signed)
New referral received from Dr. Joelyn Oms from [facility]. Pt has been scheduled to see Dr. Julien Nordmann on 10/1 at 1130am. Referring office will notify the pt. Letter mailed.

## 2018-05-10 ENCOUNTER — Other Ambulatory Visit: Payer: Self-pay | Admitting: Internal Medicine

## 2018-05-11 ENCOUNTER — Encounter: Payer: Self-pay | Admitting: Internal Medicine

## 2018-05-11 ENCOUNTER — Inpatient Hospital Stay: Payer: Medicare Other

## 2018-05-11 ENCOUNTER — Inpatient Hospital Stay: Payer: Medicare Other | Attending: Internal Medicine | Admitting: Internal Medicine

## 2018-05-11 VITALS — BP 66/40 | HR 93 | Resp 20 | Ht 68.0 in | Wt 177.5 lb

## 2018-05-11 DIAGNOSIS — I132 Hypertensive heart and chronic kidney disease with heart failure and with stage 5 chronic kidney disease, or end stage renal disease: Secondary | ICD-10-CM | POA: Diagnosis not present

## 2018-05-11 DIAGNOSIS — E1122 Type 2 diabetes mellitus with diabetic chronic kidney disease: Secondary | ICD-10-CM | POA: Diagnosis not present

## 2018-05-11 DIAGNOSIS — N186 End stage renal disease: Secondary | ICD-10-CM | POA: Diagnosis not present

## 2018-05-11 DIAGNOSIS — D45 Polycythemia vera: Secondary | ICD-10-CM

## 2018-05-11 DIAGNOSIS — Z992 Dependence on renal dialysis: Secondary | ICD-10-CM | POA: Insufficient documentation

## 2018-05-11 DIAGNOSIS — I509 Heart failure, unspecified: Secondary | ICD-10-CM | POA: Diagnosis not present

## 2018-05-11 DIAGNOSIS — Z7982 Long term (current) use of aspirin: Secondary | ICD-10-CM | POA: Diagnosis not present

## 2018-05-11 DIAGNOSIS — M109 Gout, unspecified: Secondary | ICD-10-CM | POA: Insufficient documentation

## 2018-05-11 DIAGNOSIS — Z950 Presence of cardiac pacemaker: Secondary | ICD-10-CM | POA: Insufficient documentation

## 2018-05-11 DIAGNOSIS — Z87891 Personal history of nicotine dependence: Secondary | ICD-10-CM | POA: Insufficient documentation

## 2018-05-11 DIAGNOSIS — D751 Secondary polycythemia: Secondary | ICD-10-CM | POA: Insufficient documentation

## 2018-05-11 DIAGNOSIS — F1011 Alcohol abuse, in remission: Secondary | ICD-10-CM

## 2018-05-11 LAB — CBC WITH DIFFERENTIAL (CANCER CENTER ONLY)
Basophils Absolute: 0.1 10*3/uL (ref 0.0–0.1)
Basophils Relative: 1 %
EOS PCT: 6 %
Eosinophils Absolute: 0.4 10*3/uL (ref 0.0–0.5)
HCT: 47 % (ref 38.4–49.9)
Hemoglobin: 15.2 g/dL (ref 13.0–17.1)
Lymphocytes Relative: 18 %
Lymphs Abs: 1 10*3/uL (ref 0.9–3.3)
MCH: 35.2 pg — AB (ref 27.2–33.4)
MCHC: 32.4 g/dL (ref 32.0–36.0)
MCV: 108.6 fL — AB (ref 79.3–98.0)
MONOS PCT: 8 %
Monocytes Absolute: 0.5 10*3/uL (ref 0.1–0.9)
Neutro Abs: 4 10*3/uL (ref 1.5–6.5)
Neutrophils Relative %: 67 %
Platelet Count: 245 10*3/uL (ref 140–400)
RBC: 4.33 MIL/uL (ref 4.20–5.82)
RDW: 16.9 % — ABNORMAL HIGH (ref 11.0–14.6)
WBC Count: 6 10*3/uL (ref 4.0–10.3)

## 2018-05-11 LAB — CMP (CANCER CENTER ONLY)
ALT: 8 U/L (ref 0–44)
AST: 11 U/L — ABNORMAL LOW (ref 15–41)
Albumin: 3.7 g/dL (ref 3.5–5.0)
Alkaline Phosphatase: 67 U/L (ref 38–126)
Anion gap: 14 (ref 5–15)
BILIRUBIN TOTAL: 0.7 mg/dL (ref 0.3–1.2)
BUN: 38 mg/dL — ABNORMAL HIGH (ref 8–23)
CO2: 26 mmol/L (ref 22–32)
CREATININE: 9.49 mg/dL — AB (ref 0.61–1.24)
Calcium: 9.5 mg/dL (ref 8.9–10.3)
Chloride: 98 mmol/L (ref 98–111)
GFR, EST AFRICAN AMERICAN: 6 mL/min — AB (ref >60)
GFR, EST NON AFRICAN AMERICAN: 5 mL/min — AB (ref >60)
Glucose, Bld: 115 mg/dL — ABNORMAL HIGH (ref 70–99)
POTASSIUM: 5 mmol/L (ref 3.5–5.1)
Sodium: 138 mmol/L (ref 135–145)
TOTAL PROTEIN: 7.7 g/dL (ref 6.5–8.1)

## 2018-05-11 LAB — IRON AND TIBC
IRON: 107 ug/dL (ref 42–163)
Saturation Ratios: 51 % (ref 42–163)
TIBC: 211 ug/dL (ref 202–409)
UIBC: 105 ug/dL

## 2018-05-11 LAB — LACTATE DEHYDROGENASE: LDH: 221 U/L — ABNORMAL HIGH (ref 98–192)

## 2018-05-11 LAB — FERRITIN: Ferritin: 710 ng/mL — ABNORMAL HIGH (ref 24–336)

## 2018-05-11 NOTE — Progress Notes (Signed)
[place] [phone]   [phone]  CONSULT NOTE  REFERRING PHYSICIAN: Dr. Pearson Grippe  REASON FOR CONSULTATION:  72 years old white male with elevated hemoglobin and hematocrit  HPI Timothy Faulkner is a 72 y.o. male with past medical history significant for multiple medical problems including history of congestive heart failure, diabetes mellitus, gout, end-stage renal disease, GERD, hypertension, dyslipidemia, peripheral arterial disease as well as macular degeneration.  The patient was seen by his nephrologist at regular basis for evaluation and he is currently on hemodialysis on Monday Wednesday and Friday of every week.  He always have blood work on Wednesdays during his treatment.  He was noted on several of these studies to have elevated hemoglobin and hematocrit.  His hemoglobin on 04/01/2018 was 18.0 and hematocrit 54%.  Repeat CBC on 04/08/2018 showed hemoglobin of 17.3 and hematocrit 51.9%.  The patient was referred to me today for evaluation and recommendation regarding his persistent polycythemia. When seen today he is feeling fine with no concerning complaints except for occasional episodes of shaking all over his body starting from his legs.  He is referred to neurology for evaluation of this condition and is scheduled to see them in few weeks.  His diet is regular and he does not consume a lot of red meat.  He is not on any iron supplements. The patient denied having any chest pain, shortness of breath, cough or hemoptysis.  He denied having any nausea, vomiting, diarrhea or constipation.  He denied having any fever or chills.  He has no headache or visual changes. Family history significant for father with heart disease and mother age 30 and still healthy. The patient is married and has 2 children.  He is to work and [place].  The patient has a history for smoking for around 30 years but quit 25 years ago.  He also has  a history of alcohol abuse in the past but quit to 3 years ago.  No history of drug abuse.  HPI  Past Medical History:  Diagnosis Date  . Anemia   . Arthritis    Gout  . Automatic implantable cardioverter-defibrillator in situ    [employer]  . CHF   . DIAB W/O COMP TYPE II/UNS NOT STATED UNCNTRL    No medication  . ESRD on dialysis [facility])    [name] 03/11/2013 (03/11/2013) dialysis M/W/F  . ESRD on dialysis ([place])   . GERD (gastroesophageal reflux disease)   . Gout    "once a year"  . Heart murmur   . HYPERCHOLESTEROLEMIA, MIXED   . HYPERTENSION   . Macular degeneration   . 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 Pacemaker   . Peripheral arterial disease (HCC)    left fifth toe ulcer, healing  . Pneumonia   . Shortness of breath   . 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 AV FISTULA PLACEMENT Right 12/13/2012   Procedure: ARTERIOVENOUS (AV) FISTULA CREATION;  Surgeon: [name], MD;  Location: [facility];  Service: Vascular;  Laterality: Right;  Ultrasound guided  . AV FISTULA PLACEMENT Left 05/07/2016   Procedure: LEFT RADIOCEPHALIC ARTERIOVENOUS (AV) FISTULA CREATION;  Surgeon: [name], MD;  Location: [facility];  Service: Vascular;  Laterality: Left;  . BASCILIC VEIN TRANSPOSITION Right 03/26/2016   Procedure: RIGHT BASILIC VEIN TRANSPOSITION;  Surgeon: [name], MD;  Location: [facility];  Service: Vascular;  Laterality: Right;  . CARDIAC CATHETERIZATION    . CARDIAC DEFIBRILLATOR PLACEMENT     Chemical engineer  . EYE SURGERY Bilateral    Cataract  . FISTULOGRAM Left 04/22/2018   Procedure: FISTULOGRAM UPPER EXTREMITY;  Surgeon: [name], MD;  Location: [facility];  Service: Vascular;  Laterality: Left;  . INCISION AND DRAINAGE ABSCESS / HEMATOMA OF BURSA / KNEE / THIGH Left 2012   "knee" (03/11/2013)  . INSERT / REPLACE / REMOVE PACEMAKER    . INSERTION OF  DIALYSIS CATHETER Left 04/22/2018   Procedure: INSERTION OF DIALYSIS CATHETER;  Surgeon: [name], MD;  Location: [facility];  Service: Vascular;  Laterality: Left;  . REVISON OF ARTERIOVENOUS FISTULA Right 7/51/7001   Procedure: Plication OF Right Arm ARTERIOVENOUS FISTULA;  Surgeon: Elam Dutch, MD;  [facility];  Service: Vascular;  Laterality: Right;  . REVISON OF ARTERIOVENOUS FISTULA Left 04/22/2018   Procedure: REVISION OF RADIOCEPHALIC ARTERIOVENOUS FISTULA;  Surgeon: [name], MD;  Location: [facility];  Service: Vascular;  Laterality: Left;  . SHUNTOGRAM N/A 05/31/2013   Procedure: Earney Mallet;  Surgeon: [name], MD;  Location: [facility] CATH LAB;  Service: Cardiovascular;  Laterality: N/A;    Family History  Problem Relation Age of Onset  . Heart disease Father   . CAD Father     Social History Social History   Tobacco Use  . Smoking status: Former Smoker    Packs/day: 0.75    Years: 30.00    Pack years: 22.50    Types: Cigarettes    Last attempt to quit: 11/25/1992    Years since quitting: 25.4  . Smokeless tobacco: Never Used  Substance Use Topics  . Alcohol use: Not Currently  . Drug use: No    Allergies  Allergen Reactions  . No Known Allergies     Current Outpatient Medications  Medication Sig Dispense Refill  . aspirin EC 81 MG tablet Take 81 mg by mouth daily.    Lorin Picket 1 GM 210 MG(Fe) tablet Take 630 mg by mouth 3 (three) times daily with meals.     . carvedilol (COREG) 3.125 MG tablet Take one tablet (3.125mg ) by mouth twice daily on non dialysis days (Patient taking differently: Take 3.125 mg by mouth See admin instructions. Take one tablet (3.125mg ) by mouth as needed daily) 40 tablet 3  . clopidogrel (PLAVIX) 75 MG tablet Take 75 mg by mouth daily.    . colchicine 0.6 MG tablet Take 0.6 mg by mouth daily as needed.  1  . lidocaine-prilocaine (EMLA) cream Apply 1 application topically See admin instructions. Apply small amount  to access site 1 to 2 hours before dialysis.  12  . oxyCODONE-acetaminophen (PERCOCET) 5-325 MG tablet Take 1 tablet by mouth every 6 (six) hours as needed for severe pain. 10 tablet 0  . SENSIPAR 30 MG tablet Take 30 mg by mouth daily.     No current facility-administered medications for this visit.     Review of Systems  Constitutional: positive for fatigue Eyes: negative Ears, nose, mouth, throat, and face: negative Respiratory: negative Cardiovascular: negative Gastrointestinal: negative Genitourinary:negative Integument/breast: negative Hematologic/lymphatic: negative Musculoskeletal:positive for muscle weakness Neurological: negative Behavioral/Psych: negative Endocrine: negative Allergic/Immunologic: negative  Physical Exam  VCB:SWHQP, healthy, no distress, well nourished and well developed SKIN: skin color, texture, turgor are normal, no rashes or significant lesions HEAD: Normocephalic, No masses, lesions, tenderness  or abnormalities EYES: normal, PERRLA, Conjunctiva are pink and non-injected EARS: External ears normal, Canals clear OROPHARYNX:no exudate, no erythema and lips, buccal mucosa, and tongue normal  NECK: supple, no adenopathy, no JVD LYMPH:  no palpable lymphadenopathy, no hepatosplenomegaly LUNGS: clear to auscultation , and palpation HEART: regular rate & rhythm, no murmurs and no gallops ABDOMEN:abdomen soft, non-tender, normal bowel sounds and no masses or organomegaly BACK: Back symmetric, no curvature., No CVA tenderness EXTREMITIES:no joint deformities, effusion, or inflammation, no edema  NEURO: alert & oriented x 3 with fluent speech, no focal motor/sensory deficits  PERFORMANCE STATUS: ECOG 1  LABORATORY DATA: Lab Results  Component Value Date   WBC 6.0 05/11/2018   HGB 15.2 05/11/2018   HCT 47.0 05/11/2018   MCV 108.6 (H) 05/11/2018   PLT 245 05/11/2018      Chemistry      Component Value Date/Time   NA 138 05/11/2018 1219   K  5.0 05/11/2018 1219   CL 98 05/11/2018 1219   CO2 26 05/11/2018 1219   BUN 38 (H) 05/11/2018 1219   CREATININE 9.49 (HH) 05/11/2018 1219      Component Value Date/Time   CALCIUM 9.5 05/11/2018 1219   ALKPHOS 67 05/11/2018 1219   AST 11 (L) 05/11/2018 1219   ALT 8 05/11/2018 1219   BILITOT 0.7 05/11/2018 1219       RADIOGRAPHIC STUDIES: Dg Chest Port 1 View  Result Date: 04/22/2018 CLINICAL DATA:  Status post dialysis catheter placement EXAM: PORTABLE CHEST 1 VIEW COMPARISON:  12/23/2016 FINDINGS: Left-sided central venous catheter tips overlie the SVC origin. No left pneumothorax. Left-sided pacing device as before. Mild cardiomegaly. No pleural effusion or pneumothorax. Vascular stent in the right axilla. IMPRESSION: 1. Left-sided central venous catheter tip overlies the SVC confluence. No left pneumothorax 2. Mild cardiomegaly Electronically Signed   By: Donavan Foil M.D.   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   PLAN: I had a lengthy discussion with the patient today about his condition.  I order several studies to rule out polycythemia vera including Jak 2 mutation.  I also order repeat CBC which showed normal hemoglobin and hematocrit today. His iron study are normal and the patient had elevated ferritin level. 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  I will call the patient with recommendation if the Jak 2 mutation is positive The patient agreed to the current plan.  He was advised to call if he has any concerning symptoms. The patient voices understanding of current disease status and treatment options and is in agreement with the current care plan.  All  questions were answered. The patient knows to call the clinic with any problems, questions or concerns. We can certainly see the patient much sooner if necessary.  Thank you so much for allowing me to participate in the care of Nelva Nay. I will continue to follow up the patient with you and assist in his care.  I spent 40 minutes counseling the patient face to face. The total time spent in the appointment was 60 minutes.  Disclaimer: This note was dictated with voice recognition software. Similar sounding words can inadvertently be transcribed and may not  be corrected upon review.   Eilleen Kempf May 11, 2018, 11:56 AM

## 2018-05-12 LAB — CUP PACEART REMOTE DEVICE CHECK
Date Time Interrogation Session: 20191002094236
Implantable Lead Implant Date: 20111118
Implantable Lead Location: 753860
Implantable Pulse Generator Implant Date: 20111118
MDC IDC LEAD SERIAL: 313758
MDC IDC PG SERIAL: 268731

## 2018-05-12 LAB — ERYTHROPOIETIN: Erythropoietin: 101.2 m[IU]/mL — ABNORMAL HIGH (ref 2.6–18.5)

## 2018-05-18 ENCOUNTER — Encounter: Payer: Self-pay | Admitting: Vascular Surgery

## 2018-05-18 ENCOUNTER — Ambulatory Visit (INDEPENDENT_AMBULATORY_CARE_PROVIDER_SITE_OTHER): Payer: Self-pay | Admitting: Vascular Surgery

## 2018-05-18 ENCOUNTER — Ambulatory Visit (HOSPITAL_COMMUNITY)
Admission: RE | Admit: 2018-05-18 | Discharge: 2018-05-18 | Disposition: A | Discharge status: Home / Self Care | Payer: Medicare Other | Source: Ambulatory Visit | Attending: Vascular Surgery | Admitting: Vascular Surgery

## 2018-05-18 ENCOUNTER — Other Ambulatory Visit: Payer: Self-pay

## 2018-05-18 VITALS — BP 123/81 | HR 64 | Temp 97.3°F | Resp 18 | Ht 68.0 in | Wt 176.0 lb

## 2018-05-18 DIAGNOSIS — N186 End stage renal disease: Secondary | ICD-10-CM | POA: Diagnosis present

## 2018-05-18 DIAGNOSIS — Z992 Dependence on renal dialysis: Secondary | ICD-10-CM

## 2018-05-18 NOTE — Progress Notes (Signed)
Patient name: Timothy Faulkner MRN: [id number] DOB: September 01, 1945 Sex: male  REASON FOR VISIT: Postop   HPI: Timothy Faulkner is a 72 y.o. male with end-stage renal disease that presents for postop check.  Patient previously presented with a ulcerated segment over his left radiocephalic AV fistula.  We took him to the OR for a fistulogram and did not feel this was salvageable and ultimately he underwent a left brachiocephalic fistula placement with interval left IJ tunnel catheter.  Patient has done well in the interim.  He denies any new complaints.  Denies any weakness or numbness in his left hand.  No fevers or chills at home.  He has no immediate concerns.  Surgery was on 04/22/2018.  Past Medical History:  Diagnosis Date  . Anemia   . Arthritis    Gout  . Automatic implantable cardioverter-defibrillator in situ    [employer]  . CHF   . DIAB W/O COMP TYPE II/UNS NOT STATED UNCNTRL    No medication  . ESRD on dialysis [facility])    [name] 03/11/2013 (03/11/2013) dialysis M/W/F  . ESRD on dialysis ([place])   . GERD (gastroesophageal reflux disease)   . Gout    "once a year"  . Heart murmur   . HYPERCHOLESTEROLEMIA, MIXED   . HYPERTENSION   . Macular degeneration   . 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 Pacemaker   . Peripheral arterial disease (HCC)    left fifth toe ulcer, healing  . Pneumonia   . Shortness of breath   . 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 AV FISTULA PLACEMENT Right 12/13/2012   Procedure: ARTERIOVENOUS (AV) FISTULA CREATION;  Surgeon: [name], MD;  Location: [place];  Service: Vascular;  Laterality: Right;  Ultrasound guided  . AV FISTULA PLACEMENT Left 05/07/2016   Procedure: LEFT RADIOCEPHALIC ARTERIOVENOUS (AV) FISTULA CREATION;  Surgeon: [name], MD;  Location: [place];  Service: Vascular;  Laterality: Left;  . BASCILIC VEIN TRANSPOSITION Right  03/26/2016   Procedure: RIGHT BASILIC VEIN TRANSPOSITION;  Surgeon: [name], MD;  Location: [place];  Service: Vascular;  Laterality: Right;  . CARDIAC CATHETERIZATION    . CARDIAC DEFIBRILLATOR PLACEMENT     Chemical engineer  . EYE SURGERY Bilateral    Cataract  . FISTULOGRAM Left 04/22/2018   Procedure: FISTULOGRAM UPPER EXTREMITY;  Surgeon: [name], MD;  Location: [place];  Service: Vascular;  Laterality: Left;  . INCISION AND DRAINAGE ABSCESS / HEMATOMA OF BURSA / KNEE / THIGH Left 2012   "knee" (03/11/2013)  . INSERT / REPLACE / REMOVE PACEMAKER    . INSERTION OF DIALYSIS CATHETER Left 04/22/2018   Procedure: INSERTION OF DIALYSIS CATHETER;  Surgeon: [name], MD;  Location: [place];  Service: Vascular;  Laterality: Left;  . REVISON OF ARTERIOVENOUS FISTULA Right 2/97/9892   Procedure: Plication OF Right Arm ARTERIOVENOUS FISTULA;  Surgeon: Elam Dutch, MD;  Location: [facility] OR;  Service: Vascular;  Laterality: Right;  . REVISON OF ARTERIOVENOUS FISTULA Left 04/22/2018   Procedure: REVISION OF RADIOCEPHALIC ARTERIOVENOUS FISTULA;  Surgeon: [name], MD;  Location: [place];  Service: Vascular;  Laterality: Left;  . SHUNTOGRAM N/A 05/31/2013   Procedure: Earney Mallet;  Surgeon: [name], MD;  Location: [facility] CATH LAB;  Service: Cardiovascular;  Laterality: N/A;    Family History  Problem  Relation Age of Onset  . Heart disease Father   . CAD Father     SOCIAL HISTORY: Social History   Tobacco Use  . Smoking status: Former Smoker    Packs/day: 0.75    Years: 30.00    Pack years: 22.50    Types: Cigarettes    Last attempt to quit: 11/25/1992    Years since quitting: 25.4  . Smokeless tobacco: Never Used  Substance Use Topics  . Alcohol use: Not Currently    Allergies  Allergen Reactions  . No Known Allergies     Current Outpatient Medications  Medication Sig Dispense Refill  . aspirin EC 81 MG tablet Take 81 mg by mouth daily.    Lorin Picket 1 GM 210 MG(Fe) tablet Take 630 mg by mouth 3 (three) times daily with meals.     . carvedilol (COREG) 3.125 MG tablet Take one tablet (3.125mg ) by mouth twice daily on non dialysis days 40 tablet 3  . clopidogrel (PLAVIX) 75 MG tablet Take 75 mg by mouth daily.    . colchicine 0.6 MG tablet Take 0.6 mg by mouth daily as needed.  1  . lidocaine-prilocaine (EMLA) cream Apply 1 application topically See admin instructions. Apply small amount to access site 1 to 2 hours before dialysis.  12  . midodrine (PROAMATINE) 10 MG tablet Take 1 tablet by mouth 3 (three) times a week.  6  . SENSIPAR 30 MG tablet Take 30 mg by mouth daily.    Marland Kitchen oxyCODONE-acetaminophen (PERCOCET) 5-325 MG tablet Take 1 tablet by mouth every 6 (six) hours as needed for severe pain. (Patient not taking: Reported on 05/18/2018) 10 tablet 0   No current facility-administered medications for this visit.     REVIEW OF SYSTEMS:  [X]  denotes positive finding, [ ]  denotes negative finding Cardiac  Comments:  Chest pain or chest pressure:    Shortness of breath upon exertion:    Short of breath when lying flat:    Irregular heart rhythm:        Vascular    Pain in calf, thigh, or hip brought on by ambulation:    Pain in feet at night that wakes you up from your sleep:     Blood clot in your veins:    Leg swelling:         Pulmonary    Oxygen at home:    Productive cough:     Wheezing:         Neurologic    Sudden weakness in arms or legs:     Sudden numbness in arms or legs:     Sudden onset of difficulty speaking or slurred speech:    Temporary loss of vision in one eye:     Problems with dizziness:         Gastrointestinal    Blood in stool:     Vomited blood:         Genitourinary    Burning when urinating:     Blood in urine:        Psychiatric    Major depression:         Hematologic    Bleeding problems:    Problems with blood clotting too easily:        Skin    Rashes or ulcers:          Constitutional    Fever or chills:      PHYSICAL EXAM: Vitals:   05/18/18 7062  BP: 123/81  Pulse: 64  Resp: 18  Temp: (!) 97.3 F (36.3 C)  TempSrc: Oral  SpO2: 100%  Weight: 79.8 kg  Height: 5\' 8"  (1.727 m)    GENERAL: The patient is a well-nourished male, in no acute distress. The vital signs are documented above. CARDIAC: There is a regular rate and rhythm.  VASCULAR:  Left forearm ulcer healing Left transverse incision below AC crease healing - some fibrinous exudate here, no overt signs of infection. Thrill in LUE brachiocephalic AVF Weak left radial pulse    DATA:   I independently reviewed his fistula duplex that shows a widely patent left brachiocephalic fistula that appears to be maturing nicely with a near 6 mm outflow vein now and good flow volume.  Assessment/Plan:  Overall Mr. Hemmelgarn appears to be doing very well.  Discussed that dialysis can start accessing his new brachiocephalic fistula at approximately 3 months postop.  Duplex shows this is maturing with nice dilation and flow volumes.  Assuming his fistula works fine we can then get his tunneled catheter removed.  I will not schedule follow-up at this time and his dialysis center can reach out when they are ready to have his catheter removed.   [name], MD [facility] Office: [phone] Pager: [place]

## 2018-05-21 ENCOUNTER — Encounter: Payer: Medicare Other | Admitting: Internal Medicine

## 2018-05-27 ENCOUNTER — Other Ambulatory Visit: Payer: Self-pay | Admitting: *Deleted

## 2018-05-27 ENCOUNTER — Other Ambulatory Visit: Payer: Self-pay

## 2018-05-27 ENCOUNTER — Encounter: Payer: Self-pay | Admitting: Neurology

## 2018-05-27 ENCOUNTER — Ambulatory Visit (INDEPENDENT_AMBULATORY_CARE_PROVIDER_SITE_OTHER): Payer: Medicare Other | Admitting: Neurology

## 2018-05-27 ENCOUNTER — Telehealth: Payer: Self-pay

## 2018-05-27 VITALS — BP 90/58 | Ht 68.0 in | Wt 176.0 lb

## 2018-05-27 DIAGNOSIS — G252 Other specified forms of tremor: Secondary | ICD-10-CM

## 2018-05-27 DIAGNOSIS — R251 Tremor, unspecified: Secondary | ICD-10-CM | POA: Diagnosis not present

## 2018-05-27 NOTE — Progress Notes (Signed)
NEUROLOGY CONSULTATION NOTE  Timothy Faulkner MRN: [id number] DOB: 03-17-1946  Referring provider: Dr. Pearson Grippe Primary care provider: Dr. [name]  Reason for consult:  Shaking episodes  Dear Dr Joelyn Oms:  Thank you for your kind referral of Timothy Faulkner for consultation of the above symptoms. Although his history is well known to you, please allow me to reiterate it for the purpose of our medical record. He is alone in the office today. Records and images were personally reviewed where available.   HISTORY OF PRESENT ILLNESS: This is a very pleasant 72 year old right-handed man with a history of hypertension, hyperlipidemia, diabetes, ESRD on hemodialysis, orthostatic hypotension on midodrine, cardiomyopathy s/p pacemaker placement, presenting for evaluation of shaking episodes. Symptoms started around 4 months ago where he reports he would get up and "it may hit me," his head would start shaking, all the way down to his legs and ankles, lasting only 10 seconds. He would have to grab a doorknob for balance, and has fallen 4 times because he was unable to catch himself. They sometimes occur while sitting down, he had one while looking at the TV, another time while in dialysis. Sometimes when he passes gas or has a bowel movement, he notices the shaking (demonstrating leg shaking). One time he noticed he could not speak for the 10 seconds it was happening. No associated confusion or loss of consciousness/awareness. No nocturnal episodes. He denies any headaches, dizziness, olfactory/gustatory hallucinations, rising epigastric sensation, focal numbness/tingling/weakness. He has some numbness and tingling in his fingers and toes. He denies any diplopia, dysarthria/dysphagia, neck/back pain. He has had some constipation recently. His wife has told his his arm is jumping, and states it is "doing it right now," showing fasciculations on the left thenar eminence. He had a normal birth and  early development.  There is no history of febrile convulsions, CNS infections such as meningitis/encephalitis, significant traumatic brain injury, neurosurgical procedures, or family history of seizures.  Laboratory Data:  Lab Results  Component Value Date   HGBA1C 5.2 09/20/2013    PAST MEDICAL HISTORY: Past Medical History:  Diagnosis Date  . Anemia   . Arthritis    Gout  . Automatic implantable cardioverter-defibrillator in situ    [employer]  . CHF   . DIAB W/O COMP TYPE II/UNS NOT STATED UNCNTRL    No medication  . ESRD on dialysis [facility])    [name] 03/11/2013 (03/11/2013) dialysis M/W/F  . ESRD on dialysis ([place])   . GERD (gastroesophageal reflux disease)   . Gout    "once a year"  . Heart murmur   . HYPERCHOLESTEROLEMIA, MIXED   . HYPERTENSION   . Macular degeneration   . 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 Pacemaker   . Peripheral arterial disease (HCC)    left fifth toe ulcer, healing  . Pneumonia   . Shortness of breath   . 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)    left fifth toe    PAST SURGICAL HISTORY: Past Surgical History:  Procedure Laterality Date  . AV FISTULA PLACEMENT Right 12/13/2012   Procedure: ARTERIOVENOUS (AV) FISTULA CREATION;  Surgeon: [name], MD;  Location: [place];  Service: Vascular;  Laterality: Right;  Ultrasound guided  . AV FISTULA PLACEMENT Left 05/07/2016   Procedure: LEFT RADIOCEPHALIC ARTERIOVENOUS (AV) FISTULA CREATION;  Surgeon: [name], MD;  Location: [place];  Service: Vascular;  Laterality: Left;  . BASCILIC VEIN TRANSPOSITION Right  03/26/2016   Procedure: RIGHT BASILIC VEIN TRANSPOSITION;  Surgeon: [name], MD;  Location: [place];  Service: Vascular;  Laterality: Right;  . CARDIAC CATHETERIZATION    . CARDIAC DEFIBRILLATOR PLACEMENT     Chemical engineer  . EYE SURGERY Bilateral    Cataract  . FISTULOGRAM Left 04/22/2018   Procedure: FISTULOGRAM UPPER EXTREMITY;  Surgeon: [name], MD;  Location: [place];  Service: Vascular;  Laterality: Left;  . INCISION AND DRAINAGE ABSCESS / HEMATOMA OF BURSA / KNEE / THIGH Left 2012   "knee" (03/11/2013)  . INSERT / REPLACE / REMOVE PACEMAKER    . INSERTION OF DIALYSIS CATHETER Left 04/22/2018   Procedure: INSERTION OF DIALYSIS CATHETER;  Surgeon: [name], MD;  Location: [place];  Service: Vascular;  Laterality: Left;  . REVISON OF ARTERIOVENOUS FISTULA Right 0/93/2355   Procedure: Plication OF Right Arm ARTERIOVENOUS FISTULA;  Surgeon: Elam Dutch, MD;  Location: [facility] OR;  Service: Vascular;  Laterality: Right;  . REVISON OF ARTERIOVENOUS FISTULA Left 04/22/2018   Procedure: REVISION OF RADIOCEPHALIC ARTERIOVENOUS FISTULA;  Surgeon: [name], MD;  Location: [place];  Service: Vascular;  Laterality: Left;  . SHUNTOGRAM N/A 05/31/2013   Procedure: Earney Mallet;  Surgeon: [name], MD;  Location: [facility] CATH LAB;  Service: Cardiovascular;  Laterality: N/A;    MEDICATIONS: Current Outpatient Medications on File Prior to Visit  Medication Sig Dispense Refill  . aspirin EC 81 MG tablet Take 81 mg by mouth daily.    Lorin Picket 1 GM 210 MG(Fe) tablet Take 630 mg by mouth 3 (three) times daily with meals.     . carvedilol (COREG) 3.125 MG tablet Take one tablet (3.125mg ) by mouth twice daily on non dialysis days 40 tablet 3  . clopidogrel (PLAVIX) 75 MG tablet Take 75 mg by mouth daily.    . colchicine 0.6 MG tablet Take 0.6 mg by mouth daily as needed.  1  . lidocaine-prilocaine (EMLA) cream Apply 1 application topically See admin instructions. Apply small amount to access site 1 to 2 hours before dialysis.  12  . midodrine (PROAMATINE) 10 MG tablet Take 1 tablet by mouth 3 (three) times a week.  6  . oxyCODONE-acetaminophen (PERCOCET) 5-325 MG tablet Take 1 tablet by mouth every 6 (six) hours as needed for severe pain. 10 tablet 0  . SENSIPAR 30 MG tablet Take 30 mg by mouth daily.     No current  facility-administered medications on file prior to visit.     ALLERGIES: Allergies  Allergen Reactions  . No Known Allergies     FAMILY HISTORY: Family History  Problem Relation Age of Onset  . Heart disease Father   . CAD Father     SOCIAL HISTORY: Social History   Socioeconomic History  . Marital status: Married    Spouse name: [name]  . Number of children: 2  . Years of education: 42  . Highest education level: Not on file  Occupational History  . Not on file  Social Needs  . Financial resource strain: Not on file  . Food insecurity:    Worry: Not on file    Inability: Not on file  . Transportation needs:    Medical: Not on file    Non-medical: Not on file  Tobacco Use  . Smoking status: Former Smoker    Packs/day: 0.75    Years: 30.00    Pack years: 22.50    Types: Cigarettes  Last attempt to quit: 11/25/1992    Years since quitting: 25.5  . Smokeless tobacco: Never Used  Substance and Sexual Activity  . Alcohol use: Not Currently  . Drug use: No  . Sexual activity: Yes  Lifestyle  . Physical activity:    Days per week: Not on file    Minutes per session: Not on file  . Stress: Not on file  Relationships  . Social connections:    Talks on phone: Not on file    Gets together: Not on file    Attends religious service: Not on file    Active member of club or organization: Not on file    Attends meetings of clubs or organizations: Not on file    Relationship status: Not on file  . Intimate partner violence:    Fear of current or ex partner: Not on file    Emotionally abused: Not on file    Physically abused: Not on file    Forced sexual activity: Not on file  Other Topics Concern  . Not on file  Social History Narrative   Patient is married [name] and lives with his wife and child   Patient has two children.   Patient is retired.   Patient has a college education.   Patient is right-handed.   Patient drinks very little caffeine.           REVIEW OF SYSTEMS: Constitutional: No fevers, chills, or sweats, no generalized fatigue, change in appetite Eyes: No visual changes, double vision, eye pain Ear, nose and throat: No hearing loss, ear pain, nasal congestion, sore throat Cardiovascular: No chest pain, palpitations Respiratory:  No shortness of breath at rest or with exertion, wheezes GastrointestinaI: No nausea, vomiting, diarrhea, abdominal pain, fecal incontinence Genitourinary:  No dysuria, urinary retention or frequency Musculoskeletal:  No neck pain, back pain Integumentary: No rash, pruritus, skin lesions Neurological: as above Psychiatric: No depression, insomnia, anxiety Endocrine: No palpitations, fatigue, diaphoresis, mood swings, change in appetite, change in weight, increased thirst Hematologic/Lymphatic:  No anemia, purpura, petechiae. Allergic/Immunologic: no itchy/runny eyes, nasal congestion, recent allergic reactions, rashes  PHYSICAL EXAM: Vitals:   05/27/18 0908  BP: (!) 90/58   General: No acute distress Head:  Normocephalic/atraumatic Eyes: Fundoscopic exam shows bilateral sharp discs, no vessel changes, exudates, or hemorrhages Neck: supple, no paraspinal tenderness, full range of motion Back: No paraspinal tenderness Heart: regular rate and rhythm Lungs: Clear to auscultation bilaterally. Vascular: No carotid bruits. Skin/Extremities: No rash, no edema. There are several scars from prior AV fistulas, he has a left chest port Neurological Exam: Mental status: alert and oriented to person, place, and time, no dysarthria or aphasia, Fund of knowledge is appropriate.  Recent and remote memory are intact.  Attention and concentration are normal.    Able to name objects and repeat phrases. Cranial nerves: CN I: not tested CN II: pupils equal, round and reactive to light, visual fields intact, fundi unremarkable. CN III, IV, VI:  full range of motion, no nystagmus, no ptosis CN V: facial  sensation intact CN VII: upper and lower face symmetric CN VIII: hearing intact to finger rub CN IX, X: gag intact, uvula midline CN XI: sternocleidomastoid and trapezius muscles intact CN XII: tongue midline Bulk & Tone: normal, brief fasciculations noted on left thenar eminence Motor: 5/5 throughout with no pronator drift. Sensation: decreased cold in a glove and stocking distribution to wrists and ankles bilaterally, intact pin on both UE, decreased pin, cold, vibration sense  to ankles bilaterally. No extinction to double simultaneous stimulation.  Romberg test slight sway Deep Tendon Reflexes: +2 on right patella, otherwise unable to elicit throughout, no ankle clonus Plantar responses: downgoing bilaterally Cerebellar: no incoordination on finger to nose testing Gait: narrow-based and steady, difficulty with tandem walk Tremor: none in office today  IMPRESSION: This is a very pleasant 72 year old right-handed man with a history of  hypertension, hyperlipidemia, diabetes, ESRD on hemodialysis, orthostatic hypotension on midodrine, cardiomyopathy s/p pacemaker placement, presenting for evaluation of shaking episodes. His neurological exam is non-focal, with evidence of a length-dependent neuropathy. There has been no loss of consciousness with the whole body shaking, which is unusual for an epileptic seizure. He has had symptoms on standing and while having a BM, but has also had them while sitting. His BP today is low, he is on midodrine for orthostatic hypotension. Symptoms suggestive of orthostatic tremor. Head CT without contrast and a 1-hour EEG will be ordered to further evaluate his symptoms. We will plan to start renal-based dosing of gabapentin after the tests, which can help with orthostatic tremor. Side effects discussed. We discussed neuropathy, if falls increase, would do balance therapy. He will follow-up in 6 months and knows to call for any changes.   Thank you for allowing me to  participate in the care of this patient. Please do not hesitate to call for any questions or concerns.   Ellouise Newer, M.D.  CC: Dr. Joelyn Oms, [name]

## 2018-05-27 NOTE — Patient Instructions (Addendum)
1. Schedule head CT without contrast  We have sent a referral to [facility] for your CT and they will call you directly to schedule your appt. They are located at [place] [address] (first floor of this building). If you need to contact them directly please call [phone].   2. Schedule 1-hour EEG 3. We will call you with results, and plan to start the gabapentin after the tests 4. If balance and falls worsen, we can do Balance therapy 5. Follow-up in 6 months, call for any changes

## 2018-05-27 NOTE — Addendum Note (Signed)
Addended by: Chester Holstein on: 05/27/2018 10:59 AM   Modules accepted: Orders

## 2018-05-27 NOTE — Telephone Encounter (Signed)
lab orders for TSH and B12 placed

## 2018-06-10 ENCOUNTER — Ambulatory Visit
Admission: RE | Admit: 2018-06-10 | Discharge: 2018-06-10 | Disposition: A | Discharge status: Home / Self Care | Payer: Medicare Other | Source: Ambulatory Visit | Attending: Neurology | Admitting: Neurology

## 2018-06-10 ENCOUNTER — Ambulatory Visit (INDEPENDENT_AMBULATORY_CARE_PROVIDER_SITE_OTHER): Payer: Medicare Other | Admitting: Neurology

## 2018-06-10 ENCOUNTER — Other Ambulatory Visit: Payer: Medicare Other

## 2018-06-10 DIAGNOSIS — R251 Tremor, unspecified: Secondary | ICD-10-CM

## 2018-06-10 DIAGNOSIS — G252 Other specified forms of tremor: Secondary | ICD-10-CM

## 2018-06-14 NOTE — Procedures (Signed)
ELECTROENCEPHALOGRAM REPORT  Date of Study: 06/10/2018  Patient's Name: Timothy Faulkner MRN: [id number] Date of Birth: [birth year]  Referring Provider: Dr. Ellouise Newer  Clinical History: This is a 72 year old man with recurrent shaking episodes with no loss of consciousness.   Medications: aspirin EC 81 MG tablet   AURYXIA 1 GM 210 MG(Fe) tablet  COREG 3.125 MG tablet  PLAVIX 75 MG tablet  colchicine 0.6 MG tablet  EMLA cream  PROAMATINE 10 MG tablet  PERCOCET 5-325 MG tablet  SENSIPAR 30 MG tablet  Technical Summary: A multichannel digital 1-hour EEG recording measured by the international 10-20 system with electrodes applied with paste and impedances below 5000 ohms performed in our laboratory with EKG monitoring in an awake and asleep patient.  Hyperventilation was not performed. Photic stimulation was performed.  The digital EEG was referentially recorded, reformatted, and digitally filtered in a variety of bipolar and referential montages for optimal display.    Description: The patient is awake and asleep during the recording.  During maximal wakefulness, there is a symmetric, medium voltage 8 Hz posterior dominant rhythm that attenuates with eye opening.  The record is symmetric.  During drowsiness and stage I sleep, there is an increase in theta slowing of the background with vertex waves seen. Photic stimulation did not elicit any abnormalities.  There were no epileptiform discharges or electrographic seizures seen.    EKG lead was unremarkable.  Impression: This 1-hour awake and asleep EEG is normal.    Clinical Correlation: A normal EEG does not exclude a clinical diagnosis of epilepsy.  If further clinical questions remain, prolonged EEG may be helpful.  Clinical correlation is advised.   Ellouise Newer, M.D.

## 2018-06-17 ENCOUNTER — Telehealth: Payer: Self-pay

## 2018-06-17 MED ORDER — GABAPENTIN 100 MG PO CAPS: 100.0000 mg | ORAL_CAPSULE | Freq: Every day | ORAL | 5 refills | Status: DC

## 2018-06-17 NOTE — Telephone Encounter (Signed)
-----   Message from Cameron Sprang, MD sent at 06/15/2018  8:39 AM EST ----- Pls let him know the brain wave test and head CT were normal. We had discussed starting a medication to potentially help with the tremor, gabapentin. Main side effect is drowsiness, so start at bedtime. Take gabapentin 100mg  qhs, pls send in Rx. Thanks

## 2018-06-17 NOTE — Telephone Encounter (Signed)
Spoke with pt relaying message below.  He states that he would like to try Gabapentin.  States that he would like it sent to [facility] on [address].  Gave instructions over the phone.  Advised that this is a very low dose and there is plenty of room to increase.  Asked that if he did not see improvement over the next week to give office a call and we may increase Gabapentin at that time.

## 2018-06-17 NOTE — Telephone Encounter (Signed)
Gabapentin 100mg  #30 with 5 refills Sig = take 1 tab QHS  Sent to [facility] on [address]

## 2018-06-21 ENCOUNTER — Encounter: Payer: Medicare Other | Admitting: Internal Medicine

## 2018-06-29 ENCOUNTER — Ambulatory Visit (INDEPENDENT_AMBULATORY_CARE_PROVIDER_SITE_OTHER): Payer: Medicare Other | Admitting: Internal Medicine

## 2018-06-29 ENCOUNTER — Encounter: Payer: Self-pay | Admitting: Internal Medicine

## 2018-06-29 VITALS — HR 92 | Ht 68.0 in | Wt 176.2 lb

## 2018-06-29 DIAGNOSIS — I5022 Chronic systolic (congestive) heart failure: Secondary | ICD-10-CM

## 2018-06-29 DIAGNOSIS — I472 Ventricular tachycardia, unspecified: Secondary | ICD-10-CM

## 2018-06-29 DIAGNOSIS — I10 Essential (primary) hypertension: Secondary | ICD-10-CM | POA: Diagnosis not present

## 2018-06-29 DIAGNOSIS — Z95810 Presence of automatic (implantable) cardiac defibrillator: Secondary | ICD-10-CM | POA: Diagnosis not present

## 2018-06-29 NOTE — Progress Notes (Signed)
HPI Mr. Bitter returns today for followup of his ICD. He is a pleasant 72 yo man with a h/o chronic systolic heart failure, HTN, ESRD on HD. He has had problems with hypotension and his beta blocker has been stopped. 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 He had a fistula revision a couple of months ago and we noted noise on his device. He states that a magnet had been placed.  Allergies  Allergen Reactions  . No Known Allergies      Current Outpatient Medications  Medication Sig Dispense Refill  . aspirin EC 81 MG tablet Take 81 mg by mouth daily.    Lorin Picket 1 GM 210 MG(Fe) tablet Take 630 mg by mouth 3 (three) times daily with meals.     . carvedilol (COREG) 3.125 MG tablet Take one tablet (3.125mg ) by mouth twice daily on non dialysis days 40 tablet 3  . clopidogrel (PLAVIX) 75 MG tablet Take 75 mg by mouth daily.    . colchicine 0.6 MG tablet Take 0.6 mg by mouth daily as needed.  1  . gabapentin (NEURONTIN) 100 MG capsule Take 1 capsule (100 mg total) by mouth at bedtime. 30 capsule 5  . lactulose, encephalopathy, (CHRONULAC) 10 GM/15ML SOLN     . lidocaine-prilocaine (EMLA) cream Apply 1 application topically See admin instructions. Apply small amount to access site 1 to 2 hours before dialysis.  12  . midodrine (PROAMATINE) 10 MG tablet Take 1 tablet by mouth 3 (three) times a week.  6  . oxyCODONE-acetaminophen (PERCOCET) 5-325 MG tablet Take 1 tablet by mouth every 6 (six) hours as needed for severe pain. 10 tablet 0  . SENSIPAR 30 MG tablet Take 30 mg by mouth daily.     No current facility-administered medications for this visit.      Past Medical History:  Diagnosis Date  . Anemia   . Arthritis    Gout  . Automatic implantable cardioverter-defibrillator in situ    [employer]  . CHF   . DIAB W/O COMP TYPE II/UNS NOT STATED UNCNTRL    No medication  . ESRD on dialysis [facility])    [name] 03/11/2013 (03/11/2013) dialysis M/W/F  . ESRD on dialysis ([place])     . GERD (gastroesophageal reflux disease)   . Gout    "once a year"  . Heart murmur   . HYPERCHOLESTEROLEMIA, MIXED   . HYPERTENSION   . Macular degeneration   . 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 Pacemaker   . Peripheral arterial disease (HCC)    left fifth toe ulcer, healing  . Pneumonia   . Shortness of breath   . 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   Past Surgical History:  Procedure Laterality Date  . AV FISTULA PLACEMENT Right 12/13/2012   Procedure: ARTERIOVENOUS (AV) FISTULA CREATION;  Surgeon: [name], MD;  Location: [place];  Service: Vascular;  Laterality: Right;  Ultrasound guided  . AV FISTULA PLACEMENT Left 05/07/2016   Procedure: LEFT RADIOCEPHALIC ARTERIOVENOUS (AV) FISTULA CREATION;  Surgeon: [name], MD;  Location: [place];  Service: Vascular;  Laterality: Left;  . BASCILIC VEIN TRANSPOSITION Right 03/26/2016   Procedure: RIGHT BASILIC VEIN TRANSPOSITION;  Surgeon: [name], MD;  Location: [place];  Service: Vascular;  Laterality: Right;  .  CARDIAC CATHETERIZATION    . CARDIAC DEFIBRILLATOR PLACEMENT     Chemical engineer  . EYE SURGERY Bilateral    Cataract  . FISTULOGRAM Left 04/22/2018   Procedure: FISTULOGRAM UPPER EXTREMITY;  Surgeon: [name], MD;  Location: [place];  Service: Vascular;  Laterality: Left;  . INCISION AND DRAINAGE ABSCESS / HEMATOMA OF BURSA / KNEE / THIGH Left 2012   "knee" (03/11/2013)  . INSERT / REPLACE / REMOVE PACEMAKER    . INSERTION OF DIALYSIS CATHETER Left 04/22/2018   Procedure: INSERTION OF DIALYSIS CATHETER;  Surgeon: [name], MD;  Location: [place];  Service: Vascular;  Laterality: Left;  . REVISON OF ARTERIOVENOUS FISTULA Right 1/61/0960   Procedure: Plication OF Right Arm ARTERIOVENOUS FISTULA;  Surgeon: Elam Dutch, MD;  Location: [facility] OR;  Service: Vascular;   Laterality: Right;  . REVISON OF ARTERIOVENOUS FISTULA Left 04/22/2018   Procedure: REVISION OF RADIOCEPHALIC ARTERIOVENOUS FISTULA;  Surgeon: [name], MD;  Location: [place];  Service: Vascular;  Laterality: Left;  . SHUNTOGRAM N/A 05/31/2013   Procedure: Earney Mallet;  Surgeon: [name], MD;  Location: [facility] CATH LAB;  Service: Cardiovascular;  Laterality: N/A;     Family History  Problem Relation Age of Onset  . Heart disease Father   . CAD Father      Social History   Socioeconomic History  . Marital status: Married    Spouse name: [name]  . Number of children: 2  . Years of education: 48  . Highest education level: Not on file  Occupational History  . Not on file  Social Needs  . Financial resource strain: Not on file  . Food insecurity:    Worry: Not on file    Inability: Not on file  . Transportation needs:    Medical: Not on file    Non-medical: Not on file  Tobacco Use  . Smoking status: Former Smoker    Packs/day: 0.75    Years: 30.00    Pack years: 22.50    Types: Cigarettes    Last attempt to quit: 11/25/1992    Years since quitting: 25.6  . Smokeless tobacco: Never Used  Substance and Sexual Activity  . Alcohol use: Not Currently  . Drug use: No  . Sexual activity: Yes  Lifestyle  . Physical activity:    Days per week: Not on file    Minutes per session: Not on file  . Stress: Not on file  Relationships  . Social connections:    Talks on phone: Not on file    Gets together: Not on file    Attends religious service: Not on file    Active member of club or organization: Not on file    Attends meetings of clubs or organizations: Not on file    Relationship status: Not on file  . Intimate partner violence:    Fear of current or ex partner: Not on file    Emotionally abused: Not on file    Physically abused: Not on file    Forced sexual activity: Not on file  Other Topics Concern  . 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"        Pulse 92   Ht 5\' 8"  (1.727 m)   Wt 176 lb 3.2 oz (79.9 kg)  SpO2 98%   BMI 26.79 kg/m   Physical Exam:  Well appearing NAD HEENT: Unremarkable Neck:  No JVD, no thyromegally Lymphatics:  No adenopathy Back:  No CVA tenderness Lungs:  Clear HEART:  Regular rate rhythm, no murmurs, no rubs, no clicks Abd:  soft, positive bowel sounds, no organomegally, no rebound, no guarding Ext:  2 plus pulses, no edema, no cyanosis, no clubbing Skin:  No rashes no nodules Neuro:  CN II through XII intact, motor grossly intact  EKG - nsr with inferior and anterior MI  DEVICE  Normal device function.  See PaceArt for details.   Assess/Plan: 1. Chronic systolic heart failure - he mostly has low output symptoms. I have asked that he stop his coreg and increase his salt intake. 2. 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 He will continue his current meds. 3. ICD - his [place] [employer] single chamber device is working normally. 4. Hypotension - his bp is low. I got 84/56 today. He will avoid bp lowering meds.  Mikle Bosworth.D.

## 2018-06-29 NOTE — Patient Instructions (Addendum)
Medication Instructions:  Your physician recommends that you continue on your current medications as directed. Please refer to the Current Medication list given to you today.  INCREASE THE SALT IN YOUR DIET  Labwork: None ordered.  Testing/Procedures: None ordered.  Follow-Up: Your physician wants you to follow-up in: 6 months with Dr. Lovena Le.   You will receive a reminder letter in the mail two months in advance. If you don't receive a letter, please call our office to schedule the follow-up appointment.  Remote monitoring is used to monitor your ICD from home. This monitoring reduces the number of office visits required to check your device to one time per year. It allows [place] to keep an eye on the functioning of your device to ensure it is working properly. You are scheduled for a device check from home on 07/22/2018. You may send your transmission at any time that day. If you have a wireless device, the transmission will be sent automatically. After your physician reviews your transmission, you will receive a postcard with your next transmission date.  Any Other Special Instructions Will Be Listed Below (If Applicable).  If you need a refill on your cardiac medications before your next appointment, please call your pharmacy.

## 2018-07-11 ENCOUNTER — Other Ambulatory Visit: Payer: Self-pay | Admitting: Neurology

## 2018-07-13 ENCOUNTER — Other Ambulatory Visit: Payer: Self-pay | Admitting: Podiatry

## 2018-07-13 ENCOUNTER — Telehealth: Payer: Self-pay | Admitting: *Deleted

## 2018-07-13 ENCOUNTER — Encounter: Payer: Self-pay | Admitting: Podiatry

## 2018-07-13 ENCOUNTER — Inpatient Hospital Stay (HOSPITAL_COMMUNITY)
Admission: EM | Admit: 2018-07-13 | Discharge: 2018-07-23 | DRG: 617 | Disposition: A | Discharge status: Home Health | Payer: Medicare Other | Attending: Internal Medicine | Admitting: Internal Medicine

## 2018-07-13 ENCOUNTER — Ambulatory Visit: Payer: Medicare Other | Admitting: Podiatry

## 2018-07-13 ENCOUNTER — Emergency Department (HOSPITAL_COMMUNITY): Payer: Medicare Other

## 2018-07-13 ENCOUNTER — Ambulatory Visit (INDEPENDENT_AMBULATORY_CARE_PROVIDER_SITE_OTHER): Payer: Medicare Other

## 2018-07-13 ENCOUNTER — Encounter (HOSPITAL_COMMUNITY): Payer: Self-pay

## 2018-07-13 ENCOUNTER — Encounter (HOSPITAL_COMMUNITY): Payer: Self-pay | Admitting: Internal Medicine

## 2018-07-13 VITALS — BP 118/77 | HR 96

## 2018-07-13 DIAGNOSIS — I739 Peripheral vascular disease, unspecified: Secondary | ICD-10-CM | POA: Diagnosis present

## 2018-07-13 DIAGNOSIS — I871 Compression of vein: Secondary | ICD-10-CM | POA: Diagnosis present

## 2018-07-13 DIAGNOSIS — I420 Dilated cardiomyopathy: Secondary | ICD-10-CM | POA: Insufficient documentation

## 2018-07-13 DIAGNOSIS — K0889 Other specified disorders of teeth and supporting structures: Secondary | ICD-10-CM | POA: Diagnosis not present

## 2018-07-13 DIAGNOSIS — E08621 Diabetes mellitus due to underlying condition with foot ulcer: Secondary | ICD-10-CM

## 2018-07-13 DIAGNOSIS — E11621 Type 2 diabetes mellitus with foot ulcer: Secondary | ICD-10-CM

## 2018-07-13 DIAGNOSIS — L03115 Cellulitis of right lower limb: Secondary | ICD-10-CM | POA: Diagnosis present

## 2018-07-13 DIAGNOSIS — L97511 Non-pressure chronic ulcer of other part of right foot limited to breakdown of skin: Secondary | ICD-10-CM

## 2018-07-13 DIAGNOSIS — Z8249 Family history of ischemic heart disease and other diseases of the circulatory system: Secondary | ICD-10-CM

## 2018-07-13 DIAGNOSIS — E1152 Type 2 diabetes mellitus with diabetic peripheral angiopathy with gangrene: Secondary | ICD-10-CM | POA: Diagnosis present

## 2018-07-13 DIAGNOSIS — R5381 Other malaise: Secondary | ICD-10-CM | POA: Diagnosis present

## 2018-07-13 DIAGNOSIS — L97514 Non-pressure chronic ulcer of other part of right foot with necrosis of bone: Secondary | ICD-10-CM | POA: Diagnosis present

## 2018-07-13 DIAGNOSIS — B952 Enterococcus as the cause of diseases classified elsewhere: Secondary | ICD-10-CM | POA: Diagnosis present

## 2018-07-13 DIAGNOSIS — I5022 Chronic systolic (congestive) heart failure: Secondary | ICD-10-CM | POA: Diagnosis present

## 2018-07-13 DIAGNOSIS — M1990 Unspecified osteoarthritis, unspecified site: Secondary | ICD-10-CM | POA: Diagnosis present

## 2018-07-13 DIAGNOSIS — N186 End stage renal disease: Secondary | ICD-10-CM

## 2018-07-13 DIAGNOSIS — L02619 Cutaneous abscess of unspecified foot: Secondary | ICD-10-CM | POA: Diagnosis not present

## 2018-07-13 DIAGNOSIS — Z992 Dependence on renal dialysis: Secondary | ICD-10-CM | POA: Diagnosis not present

## 2018-07-13 DIAGNOSIS — B351 Tinea unguium: Secondary | ICD-10-CM | POA: Diagnosis not present

## 2018-07-13 DIAGNOSIS — E785 Hyperlipidemia, unspecified: Secondary | ICD-10-CM | POA: Diagnosis present

## 2018-07-13 DIAGNOSIS — M86171 Other acute osteomyelitis, right ankle and foot: Secondary | ICD-10-CM | POA: Diagnosis present

## 2018-07-13 DIAGNOSIS — L97404 Non-pressure chronic ulcer of unspecified heel and midfoot with necrosis of bone: Secondary | ICD-10-CM

## 2018-07-13 DIAGNOSIS — S9031XA Contusion of right foot, initial encounter: Secondary | ICD-10-CM

## 2018-07-13 DIAGNOSIS — Z481 Encounter for planned postprocedural wound closure: Secondary | ICD-10-CM | POA: Diagnosis not present

## 2018-07-13 DIAGNOSIS — M86072 Acute hematogenous osteomyelitis, left ankle and foot: Secondary | ICD-10-CM | POA: Diagnosis not present

## 2018-07-13 DIAGNOSIS — L089 Local infection of the skin and subcutaneous tissue, unspecified: Secondary | ICD-10-CM | POA: Diagnosis not present

## 2018-07-13 DIAGNOSIS — M869 Osteomyelitis, unspecified: Secondary | ICD-10-CM

## 2018-07-13 DIAGNOSIS — Z95810 Presence of automatic (implantable) cardiac defibrillator: Secondary | ICD-10-CM | POA: Diagnosis present

## 2018-07-13 DIAGNOSIS — T82898A Other specified complication of vascular prosthetic devices, implants and grafts, initial encounter: Secondary | ICD-10-CM | POA: Diagnosis not present

## 2018-07-13 DIAGNOSIS — Z7982 Long term (current) use of aspirin: Secondary | ICD-10-CM

## 2018-07-13 DIAGNOSIS — I10 Essential (primary) hypertension: Secondary | ICD-10-CM | POA: Diagnosis not present

## 2018-07-13 DIAGNOSIS — L97519 Non-pressure chronic ulcer of other part of right foot with unspecified severity: Secondary | ICD-10-CM | POA: Diagnosis not present

## 2018-07-13 DIAGNOSIS — E875 Hyperkalemia: Secondary | ICD-10-CM | POA: Diagnosis present

## 2018-07-13 DIAGNOSIS — E1142 Type 2 diabetes mellitus with diabetic polyneuropathy: Secondary | ICD-10-CM | POA: Diagnosis present

## 2018-07-13 DIAGNOSIS — M79675 Pain in left toe(s): Secondary | ICD-10-CM

## 2018-07-13 DIAGNOSIS — L97529 Non-pressure chronic ulcer of other part of left foot with unspecified severity: Secondary | ICD-10-CM | POA: Diagnosis present

## 2018-07-13 DIAGNOSIS — D631 Anemia in chronic kidney disease: Secondary | ICD-10-CM | POA: Diagnosis present

## 2018-07-13 DIAGNOSIS — I998 Other disorder of circulatory system: Secondary | ICD-10-CM | POA: Diagnosis not present

## 2018-07-13 DIAGNOSIS — E1122 Type 2 diabetes mellitus with diabetic chronic kidney disease: Secondary | ICD-10-CM | POA: Diagnosis present

## 2018-07-13 DIAGNOSIS — E1169 Type 2 diabetes mellitus with other specified complication: Secondary | ICD-10-CM | POA: Diagnosis present

## 2018-07-13 DIAGNOSIS — L97528 Non-pressure chronic ulcer of other part of left foot with other specified severity: Secondary | ICD-10-CM | POA: Diagnosis not present

## 2018-07-13 DIAGNOSIS — K219 Gastro-esophageal reflux disease without esophagitis: Secondary | ICD-10-CM | POA: Diagnosis present

## 2018-07-13 DIAGNOSIS — R269 Unspecified abnormalities of gait and mobility: Secondary | ICD-10-CM | POA: Diagnosis present

## 2018-07-13 DIAGNOSIS — Z978 Presence of other specified devices: Secondary | ICD-10-CM | POA: Diagnosis not present

## 2018-07-13 DIAGNOSIS — I132 Hypertensive heart and chronic kidney disease with heart failure and with stage 5 chronic kidney disease, or end stage renal disease: Secondary | ICD-10-CM | POA: Diagnosis present

## 2018-07-13 DIAGNOSIS — M79671 Pain in right foot: Secondary | ICD-10-CM | POA: Diagnosis present

## 2018-07-13 DIAGNOSIS — L97419 Non-pressure chronic ulcer of right heel and midfoot with unspecified severity: Secondary | ICD-10-CM | POA: Diagnosis present

## 2018-07-13 DIAGNOSIS — M79674 Pain in right toe(s): Secondary | ICD-10-CM

## 2018-07-13 DIAGNOSIS — T82598A Other mechanical complication of other cardiac and vascular devices and implants, initial encounter: Secondary | ICD-10-CM | POA: Diagnosis not present

## 2018-07-13 DIAGNOSIS — E1151 Type 2 diabetes mellitus with diabetic peripheral angiopathy without gangrene: Secondary | ICD-10-CM | POA: Diagnosis not present

## 2018-07-13 DIAGNOSIS — B961 Klebsiella pneumoniae [K. pneumoniae] as the cause of diseases classified elsewhere: Secondary | ICD-10-CM | POA: Diagnosis present

## 2018-07-13 DIAGNOSIS — Z7902 Long term (current) use of antithrombotics/antiplatelets: Secondary | ICD-10-CM

## 2018-07-13 DIAGNOSIS — I2510 Atherosclerotic heart disease of native coronary artery without angina pectoris: Secondary | ICD-10-CM | POA: Diagnosis present

## 2018-07-13 DIAGNOSIS — Z79899 Other long term (current) drug therapy: Secondary | ICD-10-CM

## 2018-07-13 DIAGNOSIS — R011 Cardiac murmur, unspecified: Secondary | ICD-10-CM | POA: Diagnosis present

## 2018-07-13 DIAGNOSIS — L97414 Non-pressure chronic ulcer of right heel and midfoot with necrosis of bone: Secondary | ICD-10-CM | POA: Diagnosis not present

## 2018-07-13 DIAGNOSIS — Z01810 Encounter for preprocedural cardiovascular examination: Secondary | ICD-10-CM | POA: Diagnosis not present

## 2018-07-13 DIAGNOSIS — H3530 Unspecified macular degeneration: Secondary | ICD-10-CM | POA: Insufficient documentation

## 2018-07-13 DIAGNOSIS — E11628 Type 2 diabetes mellitus with other skin complications: Secondary | ICD-10-CM | POA: Diagnosis present

## 2018-07-13 DIAGNOSIS — L03119 Cellulitis of unspecified part of limb: Secondary | ICD-10-CM

## 2018-07-13 DIAGNOSIS — Z87891 Personal history of nicotine dependence: Secondary | ICD-10-CM

## 2018-07-13 DIAGNOSIS — L02611 Cutaneous abscess of right foot: Secondary | ICD-10-CM | POA: Diagnosis present

## 2018-07-13 DIAGNOSIS — N2581 Secondary hyperparathyroidism of renal origin: Secondary | ICD-10-CM | POA: Diagnosis present

## 2018-07-13 DIAGNOSIS — S9032XA Contusion of left foot, initial encounter: Secondary | ICD-10-CM

## 2018-07-13 DIAGNOSIS — N184 Chronic kidney disease, stage 4 (severe): Secondary | ICD-10-CM | POA: Diagnosis present

## 2018-07-13 DIAGNOSIS — M109 Gout, unspecified: Secondary | ICD-10-CM | POA: Diagnosis present

## 2018-07-13 DIAGNOSIS — Z9889 Other specified postprocedural states: Secondary | ICD-10-CM

## 2018-07-13 DIAGNOSIS — B9689 Other specified bacterial agents as the cause of diseases classified elsewhere: Secondary | ICD-10-CM | POA: Diagnosis not present

## 2018-07-13 DIAGNOSIS — I959 Hypotension, unspecified: Secondary | ICD-10-CM | POA: Diagnosis not present

## 2018-07-13 DIAGNOSIS — N189 Chronic kidney disease, unspecified: Secondary | ICD-10-CM | POA: Insufficient documentation

## 2018-07-13 LAB — COMPREHENSIVE METABOLIC PANEL
ALK PHOS: 56 U/L (ref 38–126)
ALT: 11 U/L (ref 0–44)
AST: 19 U/L (ref 15–41)
Albumin: 3.1 g/dL — ABNORMAL LOW (ref 3.5–5.0)
Anion gap: 16 — ABNORMAL HIGH (ref 5–15)
BUN: 41 mg/dL — ABNORMAL HIGH (ref 8–23)
CALCIUM: 9 mg/dL (ref 8.9–10.3)
CHLORIDE: 97 mmol/L — AB (ref 98–111)
CO2: 23 mmol/L (ref 22–32)
Creatinine, Ser: 9.49 mg/dL — ABNORMAL HIGH (ref 0.61–1.24)
GFR calc Af Amer: 6 mL/min — ABNORMAL LOW (ref >60)
GFR calc non Af Amer: 5 mL/min — ABNORMAL LOW (ref >60)
Glucose, Bld: 104 mg/dL — ABNORMAL HIGH (ref 70–99)
Potassium: 5.1 mmol/L (ref 3.5–5.1)
SODIUM: 136 mmol/L (ref 135–145)
Total Bilirubin: 0.8 mg/dL (ref 0.3–1.2)
Total Protein: 8.1 g/dL (ref 6.5–8.1)

## 2018-07-13 LAB — CBC WITH DIFFERENTIAL/PLATELET
Abs Immature Granulocytes: 0.08 10*3/uL — ABNORMAL HIGH (ref 0.00–0.07)
BASOS PCT: 1 %
Basophils Absolute: 0 10*3/uL (ref 0.0–0.1)
Eosinophils Absolute: 0.1 10*3/uL (ref 0.0–0.5)
Eosinophils Relative: 1 %
HCT: 43.2 % (ref 39.0–52.0)
Hemoglobin: 13.4 g/dL (ref 13.0–17.0)
Immature Granulocytes: 1 %
Lymphocytes Relative: 11 %
Lymphs Abs: 0.8 10*3/uL (ref 0.7–4.0)
MCH: 33.5 pg (ref 26.0–34.0)
MCHC: 31 g/dL (ref 30.0–36.0)
MCV: 108 fL — ABNORMAL HIGH (ref 80.0–100.0)
MONO ABS: 0.6 10*3/uL (ref 0.1–1.0)
Monocytes Relative: 8 %
Neutro Abs: 5.7 10*3/uL (ref 1.7–7.7)
Neutrophils Relative %: 78 %
Platelets: 308 10*3/uL (ref 150–400)
RBC: 4 MIL/uL — AB (ref 4.22–5.81)
RDW: 15.9 % — ABNORMAL HIGH (ref 11.5–15.5)
WBC: 7.3 10*3/uL (ref 4.0–10.5)
nRBC: 0 % (ref 0.0–0.2)

## 2018-07-13 LAB — C-REACTIVE PROTEIN: CRP: 18.2 mg/dL — ABNORMAL HIGH (ref <1.0)

## 2018-07-13 LAB — SURGICAL PCR SCREEN
MRSA, PCR: NEGATIVE
Staphylococcus aureus: NEGATIVE

## 2018-07-13 LAB — I-STAT CG4 LACTIC ACID, ED
Lactic Acid, Venous: 2.75 mmol/L (ref 0.5–1.9)
Lactic Acid, Venous: 2.33 mmol/L (ref 0.5–1.9)
Lactic Acid, Venous: 2.58 mmol/L (ref 0.5–1.9)

## 2018-07-13 LAB — GLUCOSE, CAPILLARY: Glucose-Capillary: 114 mg/dL — ABNORMAL HIGH (ref 70–99)

## 2018-07-13 LAB — CBG MONITORING, ED: Glucose-Capillary: 122 mg/dL — ABNORMAL HIGH (ref 70–99)

## 2018-07-13 LAB — HEMOGLOBIN A1C
Hgb A1c MFr Bld: 4.7 % — ABNORMAL LOW (ref 4.8–5.6)
Mean Plasma Glucose: 88.19 mg/dL

## 2018-07-13 LAB — PREALBUMIN: Prealbumin: 18.4 mg/dL (ref 18–38)

## 2018-07-13 LAB — SEDIMENTATION RATE: SED RATE: 93 mm/h — AB (ref 0–16)

## 2018-07-13 MED ORDER — ASPIRIN EC 81 MG PO TBEC
81.0000 mg | DELAYED_RELEASE_TABLET | Freq: Every day | ORAL | Status: DC
  Administered 2018-07-16 – 2018-07-23 (×6): 81 mg via ORAL
  Filled 2018-07-13 (×6): qty 1

## 2018-07-13 MED ORDER — SODIUM CHLORIDE 0.9 % IV SOLN
2.0000 g | INTRAVENOUS | Status: DC
  Administered 2018-07-13 – 2018-07-19 (×5): 2 g via INTRAVENOUS
  Filled 2018-07-13 (×8): qty 20

## 2018-07-13 MED ORDER — CHLORHEXIDINE GLUCONATE CLOTH 2 % EX PADS
6.0000 | MEDICATED_PAD | Freq: Every day | CUTANEOUS | Status: DC
  Administered 2018-07-15 – 2018-07-18 (×3): 6 via TOPICAL

## 2018-07-13 MED ORDER — SORBITOL 70 % SOLN
30.0000 mL | Status: DC | PRN
  Filled 2018-07-13: qty 30

## 2018-07-13 MED ORDER — HEPARIN SODIUM (PORCINE) 5000 UNIT/ML IJ SOLN
5000.0000 [IU] | Freq: Three times a day (TID) | INTRAMUSCULAR | Status: AC
  Administered 2018-07-13 – 2018-07-14 (×4): 5000 [IU] via SUBCUTANEOUS
  Filled 2018-07-13 (×4): qty 1

## 2018-07-13 MED ORDER — ACETAMINOPHEN 650 MG RE SUPP: 650.0000 mg | Freq: Four times a day (QID) | RECTAL | Status: DC | PRN

## 2018-07-13 MED ORDER — ALTEPLASE 2 MG IJ SOLR: 2.0000 mg | Freq: Once | INTRAMUSCULAR | Status: DC | PRN

## 2018-07-13 MED ORDER — LIDOCAINE-PRILOCAINE 2.5-2.5 % EX CREA: 1.0000 "application " | TOPICAL_CREAM | CUTANEOUS | Status: DC | PRN

## 2018-07-13 MED ORDER — VANCOMYCIN HCL 10 G IV SOLR
1500.0000 mg | Freq: Once | INTRAVENOUS | Status: AC
  Administered 2018-07-13: 1500 mg via INTRAVENOUS
  Filled 2018-07-13: qty 1500

## 2018-07-13 MED ORDER — HEPARIN SODIUM (PORCINE) 1000 UNIT/ML DIALYSIS: 5000.0000 [IU] | INTRAMUSCULAR | Status: DC | PRN

## 2018-07-13 MED ORDER — CINACALCET HCL 30 MG PO TABS
30.0000 mg | ORAL_TABLET | Freq: Every day | ORAL | Status: DC
  Administered 2018-07-16 – 2018-07-23 (×6): 30 mg via ORAL
  Filled 2018-07-13 (×6): qty 1

## 2018-07-13 MED ORDER — ZOLPIDEM TARTRATE 5 MG PO TABS: 5.0000 mg | ORAL_TABLET | Freq: Every evening | ORAL | Status: DC | PRN

## 2018-07-13 MED ORDER — CAMPHOR-MENTHOL 0.5-0.5 % EX LOTN
1.0000 "application " | TOPICAL_LOTION | Freq: Three times a day (TID) | CUTANEOUS | Status: DC | PRN
  Filled 2018-07-13: qty 222

## 2018-07-13 MED ORDER — DOCUSATE SODIUM 283 MG RE ENEM
1.0000 | ENEMA | RECTAL | Status: DC | PRN
  Filled 2018-07-13: qty 1

## 2018-07-13 MED ORDER — GABAPENTIN 100 MG PO CAPS
100.0000 mg | ORAL_CAPSULE | Freq: Every day | ORAL | Status: DC
  Administered 2018-07-13 – 2018-07-22 (×10): 100 mg via ORAL
  Filled 2018-07-13 (×10): qty 1

## 2018-07-13 MED ORDER — ACETAMINOPHEN 325 MG PO TABS: 650.0000 mg | ORAL_TABLET | Freq: Four times a day (QID) | ORAL | Status: DC | PRN

## 2018-07-13 MED ORDER — HEPARIN SODIUM (PORCINE) 1000 UNIT/ML DIALYSIS
1000.0000 [IU] | INTRAMUSCULAR | Status: DC | PRN
  Administered 2018-07-14: 1000 [IU] via INTRAVENOUS_CENTRAL

## 2018-07-13 MED ORDER — FERRIC CITRATE 1 GM 210 MG(FE) PO TABS
630.0000 mg | ORAL_TABLET | Freq: Three times a day (TID) | ORAL | Status: DC
  Administered 2018-07-16 – 2018-07-23 (×12): 630 mg via ORAL
  Filled 2018-07-13 (×31): qty 3

## 2018-07-13 MED ORDER — OXYCODONE-ACETAMINOPHEN 5-325 MG PO TABS
1.0000 | ORAL_TABLET | Freq: Four times a day (QID) | ORAL | Status: DC | PRN
  Administered 2018-07-14 – 2018-07-20 (×7): 1 via ORAL
  Filled 2018-07-13 (×7): qty 1

## 2018-07-13 MED ORDER — HYDROXYZINE HCL 25 MG PO TABS: 25.0000 mg | ORAL_TABLET | Freq: Three times a day (TID) | ORAL | Status: DC | PRN

## 2018-07-13 MED ORDER — MIDODRINE HCL 5 MG PO TABS
10.0000 mg | ORAL_TABLET | ORAL | Status: DC
  Administered 2018-07-16 – 2018-07-21 (×4): 10 mg via ORAL
  Filled 2018-07-13 (×3): qty 2

## 2018-07-13 MED ORDER — PENTAFLUOROPROP-TETRAFLUOROETH EX AERO: 1.0000 "application " | INHALATION_SPRAY | CUTANEOUS | Status: DC | PRN

## 2018-07-13 MED ORDER — SODIUM CHLORIDE 0.9 % IV SOLN: 100.0000 mL | INTRAVENOUS | Status: DC | PRN

## 2018-07-13 MED ORDER — INSULIN ASPART 100 UNIT/ML ~~LOC~~ SOLN
0.0000 [IU] | Freq: Three times a day (TID) | SUBCUTANEOUS | Status: DC
  Administered 2018-07-13 – 2018-07-19 (×7): 2 [IU] via SUBCUTANEOUS

## 2018-07-13 MED ORDER — CALCIUM CARBONATE ANTACID 1250 MG/5ML PO SUSP
500.0000 mg | Freq: Four times a day (QID) | ORAL | Status: DC | PRN
  Administered 2018-07-18 – 2018-07-20 (×3): 500 mg via ORAL
  Filled 2018-07-13 (×8): qty 5

## 2018-07-13 MED ORDER — ONDANSETRON HCL 4 MG/2ML IJ SOLN
4.0000 mg | Freq: Four times a day (QID) | INTRAMUSCULAR | Status: DC | PRN
  Administered 2018-07-15: 4 mg via INTRAVENOUS

## 2018-07-13 MED ORDER — METRONIDAZOLE IN NACL 5-0.79 MG/ML-% IV SOLN
500.0000 mg | Freq: Three times a day (TID) | INTRAVENOUS | Status: DC
  Administered 2018-07-13 – 2018-07-14 (×4): 500 mg via INTRAVENOUS
  Filled 2018-07-13 (×6): qty 100

## 2018-07-13 MED ORDER — VANCOMYCIN HCL IN DEXTROSE 750-5 MG/150ML-% IV SOLN
750.0000 mg | INTRAVENOUS | Status: DC
  Administered 2018-07-14 – 2018-07-19 (×3): 750 mg via INTRAVENOUS
  Filled 2018-07-13 (×3): qty 150

## 2018-07-13 MED ORDER — LIDOCAINE HCL (PF) 1 % IJ SOLN: 5.0000 mL | INTRAMUSCULAR | Status: DC | PRN

## 2018-07-13 MED ORDER — ONDANSETRON HCL 4 MG PO TABS: 4.0000 mg | ORAL_TABLET | Freq: Four times a day (QID) | ORAL | Status: DC | PRN

## 2018-07-13 MED ORDER — NEPRO/CARBSTEADY PO LIQD
237.0000 mL | Freq: Three times a day (TID) | ORAL | Status: DC | PRN
  Filled 2018-07-13: qty 237

## 2018-07-13 MED ORDER — INSULIN ASPART 100 UNIT/ML ~~LOC~~ SOLN: 0.0000 [IU] | Freq: Every day | SUBCUTANEOUS | Status: DC

## 2018-07-13 NOTE — Telephone Encounter (Signed)
I informed [place] [facility] ED pt would be coming to their facility by private vehicle with right foot abscess, and is diabetic.

## 2018-07-13 NOTE — Progress Notes (Signed)
Pharmacy Antibiotic Note  Timothy Faulkner is a 72 y.o. male admitted on 07/13/2018 with DFI.  Pharmacy has been consulted for vancomycin dosing.  Xray shows soft tissue ulceration. No evidence to suggest osteo. ESRD. Afebrile, WBC wnl.  Plan: Give vancomycin 1,500mg  IV x 1, then start vancomycin 750mg  IV QHD Start ceftriaxone 1g IV Q24h Start Flagyl 500mg  IV Q8h Monitor clinical picture, renal function, VT prn F/U C&S, abx deescalation / LOT    Temp (24hrs), Avg:98.4 F (36.9 C), Min:98.4 F (36.9 C), Max:98.4 F (36.9 C)  Recent Labs  Lab 07/13/18 1252 07/13/18 1331  WBC 7.3  --   LATICACIDVEN  --  2.75*    CrCl cannot be calculated (Patient's most recent lab result is older than the maximum 21 days allowed.).    Allergies  Allergen Reactions  . No Known Allergies     Thank you for allowing pharmacy to be a part of this patient's care.  Reginia Naas 07/13/2018 2:03 PM

## 2018-07-13 NOTE — Progress Notes (Signed)
Subjective:   72 y.o. AAM presents to clinic today with cc of elongated, thick, painful toenails b/l feet. Pt also has concern of right foot which he states was blistered and opened up over the weekend. He states he has cleaned it with peroxide and has been wearing a surgical shoe which he had at home in a closet. He denies any fever, chills, nightsweats or vomiting.  Patient is usually seen at [facility], but states he hasn't been in 6-8 months.  Upon speaking to his daughter, she states Timothy Faulkner hasn't been feeling well for the past 2-3 weeks.  She and her mother encouraged him to see someone about his foot, but he did not.  Past Medical History:  Diagnosis Date  . Anemia   . Arthritis    Gout  . Automatic implantable cardioverter-defibrillator in situ    [employer]  . CHF   . DIAB W/O COMP TYPE II/UNS NOT STATED UNCNTRL    No medication  . ESRD on dialysis [facility])    [name] 03/11/2013 (03/11/2013) dialysis M/W/F  . ESRD on dialysis ([place])   . GERD (gastroesophageal reflux disease)   . Gout    "once a year"  . Heart murmur   . HYPERCHOLESTEROLEMIA, MIXED   . HYPERTENSION   . Macular degeneration   . 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 Pacemaker   . Peripheral arterial disease (HCC)    left fifth toe ulcer, healing  . Pneumonia   . Shortness of breath   . 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 Anemia associated with chronic renal failure 07/13/2018  . Cardiomyopathy, dilated, nonischemic ([place]) 07/13/2018  . Chronic systolic heart failure ([place]) 08/22/2014  . 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 ([place]) 07/13/2014  . ESRD on dialysis ([place]) 11/11/2013  . Snoring 11/11/2013  . Unspecified sleep apnea 11/11/2013  . ESRD on hemodialysis ([place]) 09/22/2013  . Other pancytopenia ([place]) 09/22/2013  . Hemoptysis 09/20/2013  . End stage renal disease  ([place]) 05/24/2013  . Pre-transplant evaluation for kidney transplant 01/11/2013  . Chronic kidney disease (CKD), stage IV (severe) 11/25/2012  . Automatic implantable cardioverter-defibrillator in situ 10/01/2010  . DIAB W/O COMP TYPE II/UNS NOT STATED UNCNTRL 05/03/2010  . HYPERCHOLESTEROLEMIA, MIXED 05/03/2010  . Essential hypertension 05/03/2010  . CHF 05/03/2010   Past Surgical History:  Procedure Laterality Date  . AV FISTULA PLACEMENT Right 12/13/2012   Procedure: ARTERIOVENOUS (AV) FISTULA CREATION;  Surgeon: [name], MD;  Location: [place];  Service: Vascular;  Laterality: Right;  Ultrasound guided  . AV FISTULA PLACEMENT Left 05/07/2016   Procedure: LEFT RADIOCEPHALIC ARTERIOVENOUS (AV) FISTULA CREATION;  Surgeon: [name], MD;  Location: [place];  Service: Vascular;  Laterality: Left;  . BASCILIC VEIN TRANSPOSITION Right 03/26/2016   Procedure: RIGHT BASILIC VEIN TRANSPOSITION;  Surgeon: [name], MD;  Location: [place];  Service: Vascular;  Laterality: Right;  . CARDIAC CATHETERIZATION    . CARDIAC DEFIBRILLATOR PLACEMENT     Chemical engineer  . EYE SURGERY Bilateral    Cataract  . FISTULOGRAM Left 04/22/2018   Procedure: FISTULOGRAM UPPER EXTREMITY;  Surgeon: [name], MD;  Location: [place];  Service: Vascular;  Laterality: Left;  . INCISION AND DRAINAGE ABSCESS / HEMATOMA OF BURSA / KNEE / THIGH Left 2012   "knee" (03/11/2013)  . INSERT /  REPLACE / REMOVE PACEMAKER    . INSERTION OF DIALYSIS CATHETER Left 04/22/2018   Procedure: INSERTION OF DIALYSIS CATHETER;  Surgeon: [name], MD;  Location: [place];  Service: Vascular;  Laterality: Left;  . REVISON OF ARTERIOVENOUS FISTULA Right 1/82/9937   Procedure: Plication OF Right Arm ARTERIOVENOUS FISTULA;  Surgeon: Elam Dutch, MD;  Location: [facility] OR;  Service: Vascular;  Laterality: Right;  . REVISON OF ARTERIOVENOUS FISTULA Left 04/22/2018   Procedure: REVISION OF RADIOCEPHALIC ARTERIOVENOUS FISTULA;   Surgeon: [name], MD;  Location: [place];  Service: Vascular;  Laterality: Left;  . SHUNTOGRAM N/A 05/31/2013   Procedure: Earney Mallet;  Surgeon: [name], MD;  Location: [facility] CATH LAB;  Service: Cardiovascular;  Laterality: N/A;    Current Outpatient Medications:  .  aspirin EC 81 MG tablet, Take 81 mg by mouth daily., Disp: , Rfl:  .  AURYXIA 1 GM 210 MG(Fe) tablet, Take 630 mg by mouth 3 (three) times daily with meals. , Disp: , Rfl:  .  clopidogrel (PLAVIX) 75 MG tablet, Take 75 mg by mouth daily., Disp: , Rfl:  .  colchicine 0.6 MG tablet, Take 0.6 mg by mouth daily as needed., Disp: , Rfl: 1 .  gabapentin (NEURONTIN) 100 MG capsule, TAKE 1 CAPSULE (100 MG TOTAL) BY MOUTH AT BEDTIME., Disp: 90 capsule, Rfl: 2 .  lactulose, encephalopathy, (CHRONULAC) 10 GM/15ML SOLN, , Disp: , Rfl:  .  lidocaine-prilocaine (EMLA) cream, Apply 1 application topically See admin instructions. Apply small amount to access site 1 to 2 hours before dialysis., Disp: , Rfl: 12 .  midodrine (PROAMATINE) 10 MG tablet, Take 1 tablet by mouth 3 (three) times a week., Disp: , Rfl: 6 .  oxyCODONE-acetaminophen (PERCOCET) 5-325 MG tablet, Take 1 tablet by mouth every 6 (six) hours as needed for severe pain., Disp: 10 tablet, Rfl: 0 .  SENSIPAR 30 MG tablet, Take 30 mg by mouth daily., Disp: , Rfl:    Allergies  Allergen Reactions  . No Known Allergies    Social History   Tobacco Use  Smoking Status Former Smoker  . Packs/day: 0.75  . Years: 30.00  . Pack years: 22.50  . Types: Cigarettes  . Last attempt to quit: 11/25/1992  . Years since quitting: 25.6  Smokeless Tobacco Never Used   Family History  Problem Relation Age of Onset  . Heart disease Father   . CAD Father    Review of systems: Positive findings in bold.  Constitutional: +malaise, Denies chills, fatigue, fever, sweats, weight change Communication: Optometrist, sign Ecologist, hand writing, iPad/Android device Eyes:  +decreased vision secondary to macular degeneration, Denies diplopia, glare,  light sensitivity, eyeglasses Ears nose mouth throat: Hard of hearing, deaf, sign language, Denies vertigo,  denies bloody nose, denies rhinitis, denies cold sores, denies snoring Cardiovascular: HTN, edema, arrhythmia, pacemaker in place, defibrillator in place, Denies chest pain/tightness, chronic anticoagulation, blood clot Respiratory: Denies difficulty breathing, denies congestion Gastrointestinal: Denies abdominal pain, denies diarrhea, denies nausea, denies vomiting Genitourinary: +ESRD on hemodialysis, Denies nocturia, denies pain on urination, denies blood in urine Musculoskeletal: Uses cane for mobility aid, Denies cramping, stiff joints, painful joints,  Skin: +changes in toenails,+right foot wound, denies color change dryness, itchy skin, mole changes, or rash  Neurological:  numbness, paresthesias, burning in feet, denies fainting, denies seizure, denies change in speech. denies headaches, memory problems/poor historian, cerebral palsy Endocrine: Denies dry mouth, denies flushing, denies heat intolerance, denies cold intolerance, denies excessive thirst, denies  polyuria, denies nocturia, diabetes Hematological: Denies easy bleeding, denies excessive bleeding, denies easy bruising, denies enlarged lymph nodes Allergy/immunological: Denies hives denies frequent infections Psychiatric:  anxiety, depression, mood disorder, suicidal ideations, hallucinations  Physical Examination:  Objective:  Vitals: Oral Temp: 96.3 BP: 118/77 Pulse: 96  Vascular: Diminished pedal pulses b/l No digital hair b/l No pedal edema Temperature gradient warm to cool b/l  Dermatological Foul smelling full thickness ulceration located submetatarsal head 5 right foot tracking dorsally. Necrotic tissue noted dorsally and plantarly.  Foul smelling purulent drainage noted.  Left 3rd digit with preulcerative lesion noted dorsal  PIPJ. No erythema, no edema, no drainage.  Toenails 1-5 b/l elongated, thick, dystrophic with subungual debris  Musculoskeletal: Muscle strength 5/5 to all muscle groups b/l  Uses cane for mobility assistance  Xrays right foot:  radiolucency to 4th and 5th metatarsal heads  Assessment:  1. Diabetic Ulceration, infected, right foot 2. Preulcerative lesion left 3rd digit 3. Onychomycosis 1-5 b/l 4. NIDDM with PAD  Plan: Discussed examination and findings today. Xray b/l feet. Wound right foot cultured on today.  Consulted Dr. Celesta Gentile, who recommended admission to hospital for IV antibiotics, MRI and vascular consultation. Mr. Nehring will be followed in hospital by either Dr. Celesta Gentile or Dr. Hardie Pulley.  Toenails 1-5 b/l debrided in length and girth.   Follow up 3 months for routine foot care.

## 2018-07-13 NOTE — ED Notes (Signed)
Per nuclear med, bone scan not able to be done till tomorrow morning.

## 2018-07-13 NOTE — ED Notes (Signed)
Lab to add on A1c

## 2018-07-13 NOTE — ED Provider Notes (Addendum)
[facility] EMERGENCY DEPARTMENT Provider Note   CSN: [id number] Arrival date & time: 07/13/18  1146     History   Chief Complaint Chief Complaint  Patient presents with  . Foot Pain    HPI Timothy Faulkner is a 72 y.o. male.  Patient sent from podiatry office for diabetic foot infection.  Patient with ulcer to the right side of his right foot that has had drainage.  Patient has vascular disease and is also a dialysis patient.  Denies any chest pain, shortness of breath.  Has not missed dialysis.  The history is provided by the patient.  Foot Pain  This is a new problem. The current episode started more than 1 week ago. The problem occurs daily. The problem has been gradually worsening. Pertinent negatives include no chest pain, no abdominal pain, no headaches and no shortness of breath. Nothing aggravates the symptoms. Nothing relieves the symptoms. He has tried nothing for the symptoms. The treatment provided no relief.    Past Medical History:  Diagnosis Date  . Anemia   . Arthritis    Gout  . Automatic implantable cardioverter-defibrillator in situ    [employer]  . CHF   . ESRD on dialysis [facility])    [name] 03/11/2013 (03/11/2013) dialysis M/W/F  . GERD (gastroesophageal reflux disease)   . Gout    "once a year"  . Heart murmur   . HYPERCHOLESTEROLEMIA, MIXED   . HYPERTENSION   . Macular degeneration   . 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 Pacemaker   . Peripheral arterial disease (HCC)    left fifth toe ulcer, healing  . Pneumonia   . Shortness of breath   . 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 Anemia associated with chronic renal failure 07/13/2018  . Cardiomyopathy, dilated, nonischemic ([place]) 07/13/2018  . Diabetic foot infection ([place]) 07/13/2018  . Peripheral arterial disease ([place])   . Macular degeneration   . Chronic  systolic heart failure ([place]) 08/22/2014  . 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 ([place]) 07/13/2014  . ESRD on dialysis ([place]) 11/11/2013  . Snoring 11/11/2013  . Unspecified sleep apnea 11/11/2013  . ESRD on hemodialysis ([place]) 09/22/2013  . Other pancytopenia ([place]) 09/22/2013  . Hemoptysis 09/20/2013  . End stage renal disease ([place]) 05/24/2013  . Pre-transplant evaluation for kidney transplant 01/11/2013  . Chronic kidney disease (CKD), stage IV (severe) 11/25/2012  . Automatic implantable cardioverter-defibrillator in situ 10/01/2010  . DIAB W/O COMP TYPE II/UNS NOT STATED UNCNTRL 05/03/2010  . HYPERCHOLESTEROLEMIA, MIXED 05/03/2010  . Essential hypertension 05/03/2010  . CHF 05/03/2010    Past Surgical History:  Procedure Laterality Date  . AV FISTULA PLACEMENT Right 12/13/2012   Procedure: ARTERIOVENOUS (AV) FISTULA CREATION;  Surgeon: [name], MD;  Location: [facility];  Service: Vascular;  Laterality: Right;  Ultrasound guided  . AV FISTULA PLACEMENT Left 05/07/2016   Procedure: LEFT RADIOCEPHALIC ARTERIOVENOUS (AV) FISTULA CREATION;  Surgeon: [name], MD;  Location: [facility];  Service: Vascular;  Laterality: Left;  . BASCILIC VEIN TRANSPOSITION Right 03/26/2016   Procedure: RIGHT BASILIC VEIN TRANSPOSITION;  Surgeon: [name], MD;  Location: [facility];  Service: Vascular;  Laterality: Right;  . CARDIAC CATHETERIZATION    . CARDIAC DEFIBRILLATOR PLACEMENT     Chemical engineer  . EYE SURGERY Bilateral  Cataract  . FISTULOGRAM Left 04/22/2018   Procedure: FISTULOGRAM UPPER EXTREMITY;  Surgeon: [name], MD;  Location: [facility];  Service: Vascular;  Laterality: Left;  . INCISION AND DRAINAGE ABSCESS / HEMATOMA OF BURSA / KNEE / THIGH Left 2012   "knee" (03/11/2013)  . INSERT / REPLACE / REMOVE PACEMAKER    . INSERTION OF DIALYSIS CATHETER Left 04/22/2018   Procedure: INSERTION OF DIALYSIS CATHETER;  Surgeon: [name], MD;  Location: [facility];   Service: Vascular;  Laterality: Left;  . REVISON OF ARTERIOVENOUS FISTULA Right 3/61/4431   Procedure: Plication OF Right Arm ARTERIOVENOUS FISTULA;  Surgeon: Elam Dutch, MD;  Location: [facility] OR;  Service: Vascular;  Laterality: Right;  . REVISON OF ARTERIOVENOUS FISTULA Left 04/22/2018   Procedure: REVISION OF RADIOCEPHALIC ARTERIOVENOUS FISTULA;  Surgeon: [name], MD;  Location: [facility];  Service: Vascular;  Laterality: Left;  . SHUNTOGRAM N/A 05/31/2013   Procedure: Earney Mallet;  Surgeon: [name], MD;  Location: [facility] CATH LAB;  Service: Cardiovascular;  Laterality: N/A;        Home Medications    Prior to Admission medications   Medication Sig Start Date End Date Taking? Authorizing Provider  aspirin EC 81 MG tablet Take 81 mg by mouth daily.   Yes [provider]  AURYXIA 1 GM 210 MG(Fe) tablet Take 630 mg by mouth 3 (three) times daily with meals.  11/12/16  Yes [provider]  clopidogrel (PLAVIX) 75 MG tablet Take 75 mg by mouth daily.   Yes [provider]  colchicine 0.6 MG tablet Take 0.6 mg by mouth daily as needed (for gout flare ups).  02/26/18  Yes [provider]  gabapentin (NEURONTIN) 100 MG capsule TAKE 1 CAPSULE (100 MG TOTAL) BY MOUTH AT BEDTIME. 07/12/18  Yes Cameron Sprang, MD  lactulose, encephalopathy, (CHRONULAC) 10 GM/15ML SOLN Take 20 g by mouth daily as needed (constipation).  05/26/18  Yes [provider]  lidocaine-prilocaine (EMLA) cream Apply 1 application topically See admin instructions. Apply small amount to access site 1 to 2 hours before dialysis. 04/12/18  Yes [provider]  midodrine (PROAMATINE) 10 MG tablet Take 10 mg by mouth 3 (three) times a week.  04/26/18  Yes [provider]  oxyCODONE-acetaminophen (PERCOCET) 5-325 MG tablet Take 1 tablet by mouth every 6 (six) hours as needed for severe pain. 04/22/18  Yes Rhyne, Samantha J, PA-C  SENSIPAR 30 MG tablet Take 30 mg by  mouth daily. 04/21/16  Yes [provider]    Family History Family History  Problem Relation Age of Onset  . Heart disease Father   . CAD Father     Social History Social History   Tobacco Use  . Smoking status: Former Smoker    Packs/day: 0.75    Years: 30.00    Pack years: 22.50    Types: Cigarettes    Last attempt to quit: 11/25/1992    Years since quitting: 25.6  . Smokeless tobacco: Never Used  Substance Use Topics  . Alcohol use: Not Currently  . Drug use: Not Currently    Types: Marijuana     Allergies   No known allergies   Review of Systems Review of Systems  Constitutional: Negative for chills and fever.  HENT: Negative for ear pain and sore throat.   Eyes: Negative for pain and visual disturbance.  Respiratory: Negative for cough and shortness of breath.   Cardiovascular: Negative for chest pain and  palpitations.  Gastrointestinal: Negative for abdominal pain and vomiting.  Genitourinary: Negative for dysuria and hematuria.  Musculoskeletal: Negative for arthralgias and back pain.  Skin: Positive for wound. Negative for color change and rash.  Neurological: Negative for seizures, syncope and headaches.  All other systems reviewed and are negative.    Physical Exam Updated Vital Signs  ED Triage Vitals  Enc Vitals Group     BP 07/13/18 1212 114/82     Pulse Rate 07/13/18 1212 (!) 104     Resp 07/13/18 1212 16     Temp 07/13/18 1212 98.4 F (36.9 C)     Temp Source 07/13/18 1212 Oral     SpO2 07/13/18 1212 98 %     Weight --      Height --      Head Circumference --      Peak Flow --      Pain Score 07/13/18 1213 0     Pain Loc --      Pain Edu? --      Excl. in [place]? --     Physical Exam  Constitutional: He appears well-developed and well-nourished.  HENT:  Head: Normocephalic and atraumatic.  Eyes: Pupils are equal, round, and reactive to light. Conjunctivae and EOM are normal.  Neck: Normal range of motion. Neck supple.    Cardiovascular: Normal rate, regular rhythm and normal heart sounds.  No murmur heard. Doppler pulses to b/l DP  Pulmonary/Chest: Effort normal and breath sounds normal. No respiratory distress.  Abdominal: Soft. There is no tenderness.  Musculoskeletal: He exhibits no edema.  Neurological: He is alert.  Skin: Skin is warm and dry.  Ulcerated foul-smelling wound to the top of the right foot over the midfoot, some drainage that appears serosanguineous  Psychiatric: He has a normal mood and affect.  Nursing note and vitals reviewed.    ED Treatments / Results  Labs (all labs ordered are listed, but only abnormal results are displayed) Labs Reviewed  CBC WITH DIFFERENTIAL/PLATELET - Abnormal; Notable for the following components:      Result Value   RBC 4.00 (*)    MCV 108.0 (*)    RDW 15.9 (*)    Abs Immature Granulocytes 0.08 (*)    All other components within normal limits  COMPREHENSIVE METABOLIC PANEL - Abnormal; Notable for the following components:   Chloride 97 (*)    Glucose, Bld 104 (*)    BUN 41 (*)    Creatinine, Ser 9.49 (*)    Albumin 3.1 (*)    GFR calc non Af Amer 5 (*)    GFR calc Af Amer 6 (*)    Anion gap 16 (*)    All other components within normal limits  I-STAT CG4 LACTIC ACID, ED - Abnormal; Notable for the following components:   Lactic Acid, Venous 2.75 (*)    All other components within normal limits  I-STAT CG4 LACTIC ACID, ED - Abnormal; Notable for the following components:   Lactic Acid, Venous 2.33 (*)    All other components within normal limits  CULTURE, BLOOD (ROUTINE X 2)  CULTURE, BLOOD (ROUTINE X 2)  HEMOGLOBIN A1C  HIV ANTIBODY (ROUTINE TESTING W REFLEX)  SEDIMENTATION RATE  C-REACTIVE PROTEIN  PREALBUMIN    EKG None  Radiology Dg Foot Complete Left  Result Date: 07/13/2018 Please see detailed radiograph report in office note.  Dg Foot Complete Right  Result Date: 07/13/2018 CLINICAL DATA:  Right foot pain after blister  popped  on right foot above little toe 3 days ago. History of diabetes. EXAM: RIGHT FOOT COMPLETE - 3+ VIEW COMPARISON:  Earlier same day radiographs of the foot. FINDINGS: Soft tissue ulceration is seen along the lateral aspect of the forefoot adjacent to the head of the fifth metatarsal. No radiographic evidence of osteomyelitis. No cortical bone loss deep to the ulceration is identified. Comminuted intra-articular fracture of the head of the first proximal phalanx extending into the interphalangeal joint is noted. Mild degenerative joint space narrowing of the DIP and PIP joints of the second through fifth digits. Extra-articular erosive change along the medial aspect of the first metatarsal head. Midfoot articulations are maintained with tiny accessory ossicle adjacent to the tarsal navicular. Vascular calcifications crossing the ankle joint are identified compatible with history of diabetes. Small calcaneal enthesophytes are noted. Soft tissue calcifications along the course of the Achilles tendon is identified possibly related to old trauma. IMPRESSION: 1. Soft tissue ulceration along the lateral aspect of the forefoot adjacent to the fifth metatarsal head. No cortical bone loss to suggest radiographic evidence of osteomyelitis. 2. Comminuted intra-articular fracture of the first proximal phalangeal head extending into the interphalangeal joint. No significant callus formation is identified suggesting a recent fracture. 3. Ancillary findings as above. Electronically Signed   By: Ashley Royalty M.D.   On: 07/13/2018 13:37   Dg Foot Complete Right  Result Date: 07/13/2018 Please see detailed radiograph report in office note.   Procedures .Critical Care Performed by: Lennice Sites, DO Authorized by: Lennice Sites, DO   Critical care provider statement:    Critical care time (minutes):  35   Critical care time was exclusive of:  Separately billable procedures and treating other patients and teaching  time   Critical care was necessary to treat or prevent imminent or life-threatening deterioration of the following conditions: infection.   Critical care was time spent personally by me on the following activities:  Development of treatment plan with patient or surrogate, discussions with consultants, discussions with primary provider, examination of patient, ordering and performing treatments and interventions, ordering and review of laboratory studies, ordering and review of radiographic studies, pulse oximetry, re-evaluation of patient's condition and review of old charts   I assumed direction of critical care for this patient from another provider in my specialty: no     (including critical care time)  Medications Ordered in ED Medications  vancomycin (VANCOCIN) 1,500 mg in sodium chloride 0.9 % 500 mL IVPB (1,500 mg Intravenous New Bag/Given 07/13/18 1507)  cefTRIAXone (ROCEPHIN) 2 g in sodium chloride 0.9 % 100 mL IVPB (has no administration in time range)  metroNIDAZOLE (FLAGYL) IVPB 500 mg (has no administration in time range)  vancomycin (VANCOCIN) IVPB 750 mg/150 ml premix (has no administration in time range)  aspirin EC tablet 81 mg (has no administration in time range)  oxyCODONE-acetaminophen (PERCOCET/ROXICET) 5-325 MG per tablet 1 tablet (has no administration in time range)  midodrine (PROAMATINE) tablet 10 mg (has no administration in time range)  cinacalcet (SENSIPAR) tablet 30 mg (has no administration in time range)  ferric citrate (AURYXIA) tablet 630 mg (has no administration in time range)  gabapentin (NEURONTIN) capsule 100 mg (has no administration in time range)  acetaminophen (TYLENOL) tablet 650 mg (has no administration in time range)    Or  acetaminophen (TYLENOL) suppository 650 mg (has no administration in time range)  ondansetron (ZOFRAN) tablet 4 mg (has no administration in time range)    Or  ondansetron (ZOFRAN)  injection 4 mg (has no administration in time  range)  insulin aspart (novoLOG) injection 0-15 Units (has no administration in time range)  zolpidem (AMBIEN) tablet 5 mg (has no administration in time range)  sorbitol 70 % solution 30 mL (has no administration in time range)  docusate sodium (ENEMEEZ) enema 283 mg (has no administration in time range)  camphor-menthol (SARNA) lotion 1 application (has no administration in time range)    And  hydrOXYzine (ATARAX/VISTARIL) tablet 25 mg (has no administration in time range)  calcium carbonate (dosed in mg elemental calcium) suspension 500 mg of elemental calcium (has no administration in time range)  feeding supplement (NEPRO CARB STEADY) liquid 237 mL (has no administration in time range)  heparin injection 5,000 Units (has no administration in time range)  insulin aspart (novoLOG) injection 0-5 Units (has no administration in time range)     Initial Impression / Assessment and Plan / ED Course  I have reviewed the triage vital signs and the nursing notes.  Pertinent labs & imaging results that were available during my care of the patient were reviewed by me and considered in my medical decision making (see chart for details).     TEDD COTTRILL is a 72 year old male with history of hypertension, high cholesterol, end-stage renal disease on hemodialysis, diabetes who presents to the ED with right foot infection from podiatry office.  Patient with normal vitals.  No fever.  Patient with right foot wound for the last several weeks.  Got worse over the weekend.  Appeared to be draining over the weekend with purulent drainage and foul smell.  Patient also with peripheral vascular disease.  Had dialysis yesterday.  Contacted podiatry office and they recommend admission for wound care, antibiotics, MRI  Podiatry will see the patient tomorrow morning for further recommendations.  Patient had Doppler pulses in bilateral feet.  But suspect peripheral vascular disease also cause an issue for wound  care.  Patient with no significant leukocytosis.  Mild lactic acidosis. Blood cultures collected due to concern for sepsis however HDS.  Patient started on IV vancomycin, IV Rocephin, IV Flagyl for osteo/sepsis/cellutitis treatment.  X-ray showed fracture of the big toe on the right side and showed no signs of osteomyelitis.  Patient admitted to hospitalist service for further care.  Hemodynamically stable throughout my care.  Patient with foot infection likely secondary to diabetes and peripheral vascular disease.   This chart was dictated using voice recognition software.  Despite best efforts to proofread,  errors can occur which can change the documentation meaning.  Final Clinical Impressions(s) / ED Diagnoses   Final diagnoses:  Diabetic foot infection [facility])    ED Discharge Orders    None       Lennice Sites, DO 07/13/18 [place], [place], DO 07/13/18 1539

## 2018-07-13 NOTE — H&P (Signed)
History and Physical    Timothy Faulkner DXI:[id number] DOB: Sep 26, 1945 DOA: 07/13/2018  PCP: [name], MD Consultants:  Elisha Ponder - podiatry; Lovena Le - EP; Delice Lesch - neurology; Carlis Abbott - vascular; [place] - oncology; [place] - nephrology Patient coming from:  Home - lives with wife and daughter; NOK: Daughter, [phone], Timothy Faulkner  Chief Complaint: foot ulcer  HPI: Timothy Faulkner is a 72 y.o. male with medical history significant of DM; diabetic foot ulcer/PAD; HTN; HLD; ESRD on MWF HD; and CHF with AICD presenting with foot pain.  He started with an ulcer that opened on Saturday, although he had a sore there for a few weeks.  No fever.  It has been draining pus.  His daughter says he has been loopy.  He denies h/o similar ulcer.  Drainage is foul smelling.  He has periodic episodes of shaking for maybe 10 seconds, resolving spontaneously.  It puts him at risk for falls.  He has been evaluated for this.  ED Course:  Seen at [facility], sent to ER for diabetic foot infection on the right.  No osteo on xray.  ESRD on MWF HD, does not appear to need HD today.  Talked with podiatry, they will see in AM.  He does need an MRI to assess for osteo.  He has doppler pulses.  Lactate is up but he doesn't look septic - pharmacy started Vanc, Rocephin, Flagyl.    Review of Systems: As per HPI; otherwise review of systems reviewed and negative.   Ambulatory Status:  Ambulates without assistance  Past Medical History:  Diagnosis Date  . Anemia   . Arthritis    Gout  . Automatic implantable cardioverter-defibrillator in situ    [employer]  . CHF   . ESRD on dialysis [facility])    [name] 03/11/2013 (03/11/2013) dialysis M/W/F  . GERD (gastroesophageal reflux disease)   . Gout    "once a year"  . Heart murmur   . HYPERCHOLESTEROLEMIA, MIXED   . HYPERTENSION   . Macular degeneration   . 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 Pacemaker   . Peripheral arterial disease (HCC)    left  fifth toe ulcer, healing  . Pneumonia   . Shortness of breath   . 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 AV FISTULA PLACEMENT Right 12/13/2012   Procedure: ARTERIOVENOUS (AV) FISTULA CREATION;  Surgeon: [name], MD;  Location: [place];  Service: Vascular;  Laterality: Right;  Ultrasound guided  . AV FISTULA PLACEMENT Left 05/07/2016   Procedure: LEFT RADIOCEPHALIC ARTERIOVENOUS (AV) FISTULA CREATION;  Surgeon: [name], MD;  Location: [place];  Service: Vascular;  Laterality: Left;  . BASCILIC VEIN TRANSPOSITION Right 03/26/2016   Procedure: RIGHT BASILIC VEIN TRANSPOSITION;  Surgeon: [name], MD;  Location: [place];  Service: Vascular;  Laterality: Right;  . CARDIAC CATHETERIZATION    . CARDIAC DEFIBRILLATOR PLACEMENT     Chemical engineer  . EYE SURGERY Bilateral    Cataract  . FISTULOGRAM Left 04/22/2018   Procedure: FISTULOGRAM UPPER EXTREMITY;  Surgeon: [name], MD;  Location: [place];  Service: Vascular;  Laterality: Left;  . INCISION AND DRAINAGE ABSCESS / HEMATOMA OF BURSA / KNEE / THIGH Left 2012   "knee" (03/11/2013)  . INSERT / REPLACE / REMOVE PACEMAKER    . INSERTION OF DIALYSIS CATHETER Left 04/22/2018  Procedure: INSERTION OF DIALYSIS CATHETER;  Surgeon: [name], MD;  Location: [place];  Service: Vascular;  Laterality: Left;  . REVISON OF ARTERIOVENOUS FISTULA Right 2/72/5366   Procedure: Plication OF Right Arm ARTERIOVENOUS FISTULA;  Surgeon: Elam Dutch, MD;  Location: [facility] OR;  Service: Vascular;  Laterality: Right;  . REVISON OF ARTERIOVENOUS FISTULA Left 04/22/2018   Procedure: REVISION OF RADIOCEPHALIC ARTERIOVENOUS FISTULA;  Surgeon: [name], MD;  Location: [place];  Service: Vascular;  Laterality: Left;  . SHUNTOGRAM N/A 05/31/2013   Procedure: Earney Mallet;  Surgeon: [name], MD;  Location: [facility] CATH LAB;  Service:  Cardiovascular;  Laterality: N/A;    Social History   Socioeconomic History  . Marital status: Married    Spouse name: [name]  . Number of children: 2  . Years of education: 24  . Highest education level: Not on file  Occupational History  . Occupation: diasbled  Social Needs  . Financial resource strain: Not on file  . Food insecurity:    Worry: Not on file    Inability: Not on file  . Transportation needs:    Medical: Not on file    Non-medical: Not on file  Tobacco Use  . Smoking status: Former Smoker    Packs/day: 0.75    Years: 30.00    Pack years: 22.50    Types: Cigarettes    Last attempt to quit: 11/25/1992    Years since quitting: 25.6  . Smokeless tobacco: Never Used  Substance and Sexual Activity  . Alcohol use: Not Currently  . Drug use: Not Currently    Types: Marijuana  . Sexual activity: Yes  Lifestyle  . Physical activity:    Days per week: Not on file    Minutes per session: Not on file  . Stress: Not on file  Relationships  . Social connections:    Talks on phone: Not on file    Gets together: Not on file    Attends religious service: Not on file    Active member of club or organization: Not on file    Attends meetings of clubs or organizations: Not on file    Relationship status: Not on file  . Intimate partner violence:    Fear of current or ex partner: Not on file    Emotionally abused: Not on file    Physically abused: Not on file    Forced sexual activity: Not on file  Other Topics Concern  . 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 No Known Allergies     Family History  Problem Relation Age of Onset  . Heart disease Father   . CAD Father     Prior to Admission medications   Medication Sig Start Date End Date Taking? Authorizing Provider  aspirin  EC 81 MG tablet Take 81 mg by mouth daily.    [provider]  AURYXIA 1 GM 210 MG(Fe) tablet Take 630 mg by mouth 3 (three) times daily with meals.  11/12/16   [provider]  clopidogrel (PLAVIX) 75 MG tablet Take 75 mg by mouth daily.    [provider]  colchicine 0.6 MG tablet Take 0.6 mg by mouth daily as needed (for gout flare ups).  02/26/18  [provider]  gabapentin (NEURONTIN) 100 MG capsule TAKE 1 CAPSULE (100 MG TOTAL) BY MOUTH AT BEDTIME. 07/12/18   Cameron Sprang, MD  lactulose, encephalopathy, ([place]) 10 GM/15ML SOLN  05/26/18   [provider]  lidocaine-prilocaine (EMLA) cream Apply 1 application topically See admin instructions. Apply small amount to access site 1 to 2 hours before dialysis. 04/12/18   [provider]  midodrine (PROAMATINE) 10 MG tablet Take 10 mg by mouth 3 (three) times a week.  04/26/18   [provider]  oxyCODONE-acetaminophen (PERCOCET) 5-325 MG tablet Take 1 tablet by mouth every 6 (six) hours as needed for severe pain. 04/22/18   Rhyne, Samantha J, PA-C  SENSIPAR 30 MG tablet Take 30 mg by mouth daily. 04/21/16   [provider]    Physical Exam: Vitals:   07/13/18 1212  BP: 114/82  Pulse: (!) 104  Resp: 16  Temp: 98.4 F (36.9 C)  TempSrc: Oral  SpO2: 98%     General:  Appears calm and comfortable and is NAD Eyes:  PERRL, EOMI, normal lids, iris ENT:  grossly normal hearing, lips & tongue, mmm; poor dentition Neck:  no LAD, masses or thyromegaly Cardiovascular:  Mild tachycardia, no m/r/g. No LE edema.  Respiratory:   CTA bilaterally with no wheezes/rales/rhonchi.  Normal respiratory effort. Abdomen:  soft, NT, ND, NABS Skin: plantar ulcer on the lateral right foot with ulceration on the dorsal surface adjacent, purulent and foul-smelling drainage without obvious surrounding erythema       Musculoskeletal:  grossly normal tone BUE/BLE, good ROM, no bony  abnormality Lower extremity:  No LE edema.   1+ distal pulses. Psychiatric:  grossly normal mood and affect, speech fluent and appropriate, AOx3 Neurologic:  CN 2-12 grossly intact, moves all extremities in coordinated fashion    Radiological Exams on Admission: Dg Foot Complete Left  Result Date: 07/13/2018 Please see detailed radiograph report in office note.  Dg Foot Complete Right  Result Date: 07/13/2018 CLINICAL DATA:  Right foot pain after blister popped on right foot above little toe 3 days ago. History of diabetes. EXAM: RIGHT FOOT COMPLETE - 3+ VIEW COMPARISON:  Earlier same day radiographs of the foot. FINDINGS: Soft tissue ulceration is seen along the lateral aspect of the forefoot adjacent to the head of the fifth metatarsal. No radiographic evidence of osteomyelitis. No cortical bone loss deep to the ulceration is identified. Comminuted intra-articular fracture of the head of the first proximal phalanx extending into the interphalangeal joint is noted. Mild degenerative joint space narrowing of the DIP and PIP joints of the second through fifth digits. Extra-articular erosive change along the medial aspect of the first metatarsal head. Midfoot articulations are maintained with tiny accessory ossicle adjacent to the tarsal navicular. Vascular calcifications crossing the ankle joint are identified compatible with history of diabetes. Small calcaneal enthesophytes are noted. Soft tissue calcifications along the course of the Achilles tendon is identified possibly related to old trauma. IMPRESSION: 1. Soft tissue ulceration along the lateral aspect of the forefoot adjacent to the fifth metatarsal head. No cortical bone loss to suggest radiographic evidence of osteomyelitis. 2. Comminuted intra-articular fracture of the first proximal phalangeal head extending into the interphalangeal joint. No significant callus formation is identified suggesting a recent fracture. 3. Ancillary findings as  above. Electronically Signed   By: Ashley Royalty M.D.   On: 07/13/2018 13:37   Dg Foot Complete Right  Result Date: 07/13/2018 Please see detailed radiograph report in office  note.   EKG: not done   Labs on Admission: I have personally reviewed the available labs and imaging studies at the time of the admission.  Pertinent labs:   Glucose 104 BUN 41/Creatinine 9.49/GFR 5 Anion gap 16 Albumin 3.1 Lactate 2.75 WBC 7.3 Blood cultures x 1 so far  Assessment/Plan Principal Problem:   Diabetic foot infection (HCC) Active Problems:   Essential hypertension   Automatic implantable cardioverter-defibrillator in situ   ESRD on dialysis ([place])   Cardiomyopathy, dilated, nonischemic (HCC)   Peripheral arterial disease (HCC)   Diabetic foot infection -Patient reports only recent ulceration, but it has obviously been there on his plantar foot much longer and only recently erupted on the dorsolateral foot -This appears to be a deep infection and is concerning for osteomyelitis -Podiatry is planning to see the patient in the AM to determine if I&D is needed -Will admit, Med Surg -NPO after MN in case of need for surgery -Continue 81 mg ASA but hold Plavix -Antibiotics with Rocephin, Flagyl, and Vancomycin per diabetic foot ulcer order set -Patient is unable to have MRI to assess for osteomyelitis due to AICD; will order nuc med bone scan -Will order ABIs -Diabetic foot infection order set utilized, including orders for CM, SW, DM coordinator, wound care, and nutrition consult. -Goal would be for glucose <150 to facilitate wound healing. -Patient should be on bed rest, non-weight bearing. -Excellent BP control is needed   ESRD on HD -Patient on chronic MWF HD -Nephrology prn order set utilized -He does not appear to be volume overloaded or otherwise in need of acute HD -Dr. Posey Pronto has been notified of need for HD in AM  HTN -No longer taking HTN medications -Continue Midodrine on HD  days  NICM, with AICD -Remote echo, last in 2014, with EF 20-25% -Has AICD, not MRI-compatible -He does not appear to have volume overload at this time   DVT prophylaxis: Heparin SQ Code Status:  Full - confirmed with patient Family Communication: None present Disposition Plan:  Home once clinically improved Consults called: Nephrology; podiatry; CM, SW, DM coordinator, wound care, and nutrition consult  Admission status: Admit - It is my clinical opinion that admission to INPATIENT is reasonable and necessary because of the expectation that this patient will require hospital care that crosses at least 2 midnights to treat this condition based on the medical complexity of the problems presented.  Given the aforementioned information, the predictability of an adverse outcome is felt to be significant.    Karmen Bongo MD [facility]  If note is complete, please contact covering daytime or nighttime physician. [email or web address] Password [email or web address]  07/13/2018, 5:03 PM

## 2018-07-13 NOTE — Patient Instructions (Addendum)
PLEASE REPORT TO [place]. DR PRICE, OR DR Jacqualyn Posey WILL SEE YOU IN THE HOSPITAL.

## 2018-07-13 NOTE — ED Triage Notes (Signed)
Pt presents with 2 week h/o infection to R foot, pt reports it began with callus that burst and began bleeding on Saturday.

## 2018-07-14 ENCOUNTER — Inpatient Hospital Stay (HOSPITAL_COMMUNITY): Payer: Medicare Other

## 2018-07-14 ENCOUNTER — Other Ambulatory Visit: Payer: Self-pay

## 2018-07-14 DIAGNOSIS — Z992 Dependence on renal dialysis: Secondary | ICD-10-CM

## 2018-07-14 DIAGNOSIS — I739 Peripheral vascular disease, unspecified: Secondary | ICD-10-CM

## 2018-07-14 DIAGNOSIS — L089 Local infection of the skin and subcutaneous tissue, unspecified: Secondary | ICD-10-CM

## 2018-07-14 DIAGNOSIS — N186 End stage renal disease: Secondary | ICD-10-CM

## 2018-07-14 DIAGNOSIS — M869 Osteomyelitis, unspecified: Secondary | ICD-10-CM

## 2018-07-14 DIAGNOSIS — L03119 Cellulitis of unspecified part of limb: Secondary | ICD-10-CM

## 2018-07-14 DIAGNOSIS — I998 Other disorder of circulatory system: Secondary | ICD-10-CM

## 2018-07-14 DIAGNOSIS — I10 Essential (primary) hypertension: Secondary | ICD-10-CM

## 2018-07-14 DIAGNOSIS — I420 Dilated cardiomyopathy: Secondary | ICD-10-CM

## 2018-07-14 DIAGNOSIS — E11628 Type 2 diabetes mellitus with other skin complications: Secondary | ICD-10-CM

## 2018-07-14 DIAGNOSIS — L02619 Cutaneous abscess of unspecified foot: Secondary | ICD-10-CM

## 2018-07-14 LAB — BASIC METABOLIC PANEL
Anion gap: 18 — ABNORMAL HIGH (ref 5–15)
BUN: 47 mg/dL — ABNORMAL HIGH (ref 8–23)
CO2: 24 mmol/L (ref 22–32)
Calcium: 8.9 mg/dL (ref 8.9–10.3)
Chloride: 97 mmol/L — ABNORMAL LOW (ref 98–111)
Creatinine, Ser: 9.66 mg/dL — ABNORMAL HIGH (ref 0.61–1.24)
GFR calc Af Amer: 6 mL/min — ABNORMAL LOW (ref >60)
GFR calc non Af Amer: 5 mL/min — ABNORMAL LOW (ref >60)
Glucose, Bld: 93 mg/dL (ref 70–99)
Potassium: 4.7 mmol/L (ref 3.5–5.1)
SODIUM: 139 mmol/L (ref 135–145)

## 2018-07-14 LAB — GLUCOSE, CAPILLARY
Glucose-Capillary: 72 mg/dL (ref 70–99)
Glucose-Capillary: 67 mg/dL — ABNORMAL LOW (ref 70–99)
Glucose-Capillary: 71 mg/dL (ref 70–99)
Glucose-Capillary: 113 mg/dL — ABNORMAL HIGH (ref 70–99)
Glucose-Capillary: 127 mg/dL — ABNORMAL HIGH (ref 70–99)

## 2018-07-14 LAB — CBC
HCT: 41.5 % (ref 39.0–52.0)
Hemoglobin: 12.5 g/dL — ABNORMAL LOW (ref 13.0–17.0)
MCH: 32.1 pg (ref 26.0–34.0)
MCHC: 30.1 g/dL (ref 30.0–36.0)
MCV: 106.4 fL — ABNORMAL HIGH (ref 80.0–100.0)
Platelets: 337 10*3/uL (ref 150–400)
RBC: 3.9 MIL/uL — ABNORMAL LOW (ref 4.22–5.81)
RDW: 15.8 % — ABNORMAL HIGH (ref 11.5–15.5)
WBC: 6.7 10*3/uL (ref 4.0–10.5)
nRBC: 0 % (ref 0.0–0.2)

## 2018-07-14 LAB — HIV ANTIBODY (ROUTINE TESTING W REFLEX): HIV Screen 4th Generation wRfx: NONREACTIVE

## 2018-07-14 MED ORDER — RENA-VITE PO TABS
1.0000 | ORAL_TABLET | Freq: Every day | ORAL | Status: DC
  Administered 2018-07-14 – 2018-07-22 (×9): 1 via ORAL
  Filled 2018-07-14 (×9): qty 1

## 2018-07-14 MED ORDER — LIDOCAINE HCL (PF) 1 % IJ SOLN
INTRAMUSCULAR | Status: AC
  Filled 2018-07-14: qty 30

## 2018-07-14 MED ORDER — LIDOCAINE HCL 1 % IJ SOLN
20.0000 mL | Freq: Once | INTRAMUSCULAR | Status: DC
  Filled 2018-07-14: qty 20

## 2018-07-14 MED ORDER — ALTEPLASE 2 MG IJ SOLR
INTRAMUSCULAR | Status: AC
  Administered 2018-07-14: 2 mg
  Filled 2018-07-14: qty 4

## 2018-07-14 MED ORDER — METRONIDAZOLE IN NACL 5-0.79 MG/ML-% IV SOLN
500.0000 mg | Freq: Three times a day (TID) | INTRAVENOUS | Status: DC
  Administered 2018-07-15 – 2018-07-19 (×12): 500 mg via INTRAVENOUS
  Filled 2018-07-14 (×15): qty 100

## 2018-07-14 MED ORDER — ALTEPLASE 2 MG IJ SOLR
2.0000 mg | Freq: Once | INTRAMUSCULAR | Status: AC
  Administered 2018-07-14: 2 mg

## 2018-07-14 MED ORDER — HEPARIN SODIUM (PORCINE) 1000 UNIT/ML IJ SOLN
INTRAMUSCULAR | Status: AC
  Administered 2018-07-14: 1000 [IU] via INTRAVENOUS_CENTRAL
  Filled 2018-07-14: qty 2

## 2018-07-14 MED ORDER — VANCOMYCIN HCL IN DEXTROSE 750-5 MG/150ML-% IV SOLN
INTRAVENOUS | Status: AC
  Administered 2018-07-14: 750 mg via INTRAVENOUS
  Filled 2018-07-14: qty 150

## 2018-07-14 MED ORDER — HEPARIN SODIUM (PORCINE) 1000 UNIT/ML IJ SOLN
INTRAMUSCULAR | Status: AC
  Filled 2018-07-14: qty 5

## 2018-07-14 MED ORDER — PRO-STAT SUGAR FREE PO LIQD
30.0000 mL | Freq: Two times a day (BID) | ORAL | Status: DC
  Administered 2018-07-14 – 2018-07-21 (×2): 30 mL via ORAL
  Filled 2018-07-14 (×7): qty 30

## 2018-07-14 MED ORDER — NEPRO/CARBSTEADY PO LIQD
237.0000 mL | ORAL | Status: DC
  Administered 2018-07-14 – 2018-07-20 (×2): 237 mL via ORAL
  Filled 2018-07-14 (×6): qty 237

## 2018-07-14 NOTE — Progress Notes (Signed)
returned from HD.

## 2018-07-14 NOTE — Procedures (Addendum)
Patient seen on Hemodialysis-appears comfortable and denies any complaints. QB 250, UF goal 1.7L Catheter malfunction persists in spite of TPA 2 ports/repositioning..will request IR to replace  Elmarie Shiley MD [facility]. Office # [phone] Pager # [phone] 9:56 AM

## 2018-07-14 NOTE — Consult Note (Signed)
Reason for Consult: R foot infection Referring Physician: Clovis Mankins is an 72 y.o. male.  HPI: 72 year old male seen yesterday in the office, sent for concern due to infected blistered area to the right foot. Sore has been present for weeks but the ulceration worsened on Saturday. Has not been feeling well for several weeks and has been attempting self care for the ulceration.   Attempted to see patient earlier today, was at HD.   Past Medical History:  Diagnosis Date  . Anemia   . Arthritis    Gout  . Automatic implantable cardioverter-defibrillator in situ    [employer]  . CHF   . ESRD on dialysis [facility])    [name] 03/11/2013 (03/11/2013) dialysis M/W/F  . GERD (gastroesophageal reflux disease)   . Gout    "once a year"  . Heart murmur   . HYPERCHOLESTEROLEMIA, MIXED   . HYPERTENSION   . Macular degeneration   . 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 Pacemaker   . Peripheral arterial disease (HCC)    left fifth toe ulcer, healing  . Pneumonia   . Shortness of breath   . 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 AV FISTULA PLACEMENT Right 12/13/2012   Procedure: ARTERIOVENOUS (AV) FISTULA CREATION;  Surgeon: [name], MD;  Location: [place];  Service: Vascular;  Laterality: Right;  Ultrasound guided  . AV FISTULA PLACEMENT Left 05/07/2016   Procedure: LEFT RADIOCEPHALIC ARTERIOVENOUS (AV) FISTULA CREATION;  Surgeon: [name], MD;  Location: [place];  Service: Vascular;  Laterality: Left;  . BASCILIC VEIN TRANSPOSITION Right 03/26/2016   Procedure: RIGHT BASILIC VEIN TRANSPOSITION;  Surgeon: [name], MD;  Location: [place];  Service: Vascular;  Laterality: Right;  . CARDIAC CATHETERIZATION    . CARDIAC DEFIBRILLATOR PLACEMENT     Chemical [employer]  . EYE SURGERY Bilateral    Cataract  . FISTULOGRAM Left 04/22/2018   Procedure: FISTULOGRAM UPPER EXTREMITY;   Surgeon: [name], MD;  Location: [place];  Service: Vascular;  Laterality: Left;  . INCISION AND DRAINAGE ABSCESS / HEMATOMA OF BURSA / KNEE / THIGH Left 2012   "knee" (03/11/2013)  . INSERT / REPLACE / REMOVE PACEMAKER    . INSERTION OF DIALYSIS CATHETER Left 04/22/2018   Procedure: INSERTION OF DIALYSIS CATHETER;  Surgeon: [name], MD;  Location: [place];  Service: Vascular;  Laterality: Left;  . REVISON OF ARTERIOVENOUS FISTULA Right 5/68/1275   Procedure: Plication OF Right Arm ARTERIOVENOUS FISTULA;  Surgeon: Elam Dutch, MD;  Location: [facility] OR;  Service: Vascular;  Laterality: Right;  . REVISON OF ARTERIOVENOUS FISTULA Left 04/22/2018   Procedure: REVISION OF RADIOCEPHALIC ARTERIOVENOUS FISTULA;  Surgeon: [name], MD;  Location: [place];  Service: Vascular;  Laterality: Left;  . SHUNTOGRAM N/A 05/31/2013   Procedure: Earney Mallet;  Surgeon: [name], MD;  Location: [facility] CATH LAB;  Service: Cardiovascular;  Laterality: N/A;    Family History  Problem Relation Age of Onset  . Heart disease Father   . CAD Father     Social History:  reports that he quit smoking about 25 years ago. His smoking use included cigarettes. He has a 22.50 pack-year smoking history. He has never used smokeless tobacco. He reports that he drank alcohol. He reports that he has current or past drug history. Drug:  Marijuana.  Allergies:  Allergies  Allergen Reactions  . No Known Allergies     Medications: I have reviewed the patient's current medications.  Results for orders placed or performed during the hospital encounter of 07/13/18 (from the past 48 hour(s))  CBC with Differential     Status: Abnormal   Collection Time: 07/13/18 12:52 PM  Result Value Ref Range   WBC 7.3 4.0 - 10.5 K/uL   RBC 4.00 (L) 4.22 - 5.81 MIL/uL   Hemoglobin 13.4 13.0 - 17.0 g/dL   HCT 43.2 39.0 - 52.0 %   MCV 108.0 (H) 80.0 - 100.0 fL   MCH 33.5 26.0 - 34.0 pg   MCHC 31.0 30.0 - 36.0 g/dL    RDW 15.9 (H) 11.5 - 15.5 %   Platelets 308 150 - 400 K/uL   nRBC 0.0 0.0 - 0.2 %   Neutrophils Relative % 78 %   Neutro Abs 5.7 1.7 - 7.7 K/uL   Lymphocytes Relative 11 %   Lymphs Abs 0.8 0.7 - 4.0 K/uL   Monocytes Relative 8 %   Monocytes Absolute 0.6 0.1 - 1.0 K/uL   Eosinophils Relative 1 %   Eosinophils Absolute 0.1 0.0 - 0.5 K/uL   Basophils Relative 1 %   Basophils Absolute 0.0 0.0 - 0.1 K/uL   Immature Granulocytes 1 %   Abs Immature Granulocytes 0.08 (H) 0.00 - 0.07 K/uL    Comment: Performed at [facility], [address], [place], [place] [address]  Comprehensive metabolic panel     Status: Abnormal   Collection Time: 07/13/18 12:52 PM  Result Value Ref Range   Sodium 136 135 - 145 mmol/L   Potassium 5.1 3.5 - 5.1 mmol/L   Chloride 97 (L) 98 - 111 mmol/L   CO2 23 22 - 32 mmol/L   Glucose, Bld 104 (H) 70 - 99 mg/dL   BUN 41 (H) 8 - 23 mg/dL   Creatinine, Ser 9.49 (H) 0.61 - 1.24 mg/dL   Calcium 9.0 8.9 - 10.3 mg/dL   Total Protein 8.1 6.5 - 8.1 g/dL   Albumin 3.1 (L) 3.5 - 5.0 g/dL   AST 19 15 - 41 U/L   ALT 11 0 - 44 U/L   Alkaline Phosphatase 56 38 - 126 U/L   Total Bilirubin 0.8 0.3 - 1.2 mg/dL   GFR calc non Af Amer 5 (L) >60 mL/min   GFR calc Af Amer 6 (L) >60 mL/min   Anion gap 16 (H) 5 - 15    Comment: Performed at [facility], [place] [address], [place], [place] [address]  Blood culture (routine x 2)     Status: None (Preliminary result)   Collection Time: 07/13/18 12:52 PM  Result Value Ref Range   Specimen Description BLOOD RIGHT HAND    Special Requests      BOTTLES DRAWN AEROBIC ONLY Blood Culture results may not be optimal due to an inadequate volume of blood received in culture bottles   Culture      NO GROWTH < 24 HOURS Performed at [facility] [address] [place], [place] [address]    Report Status PENDING   Hemoglobin A1c     Status: Abnormal   Collection Time: 07/13/18 12:52 PM  Result Value Ref Range   Hgb A1c  MFr Bld 4.7 (L) 4.8 - 5.6 %    Comment: (NOTE) Pre diabetes:          5.7%-6.4% Diabetes:              >  6.4% Glycemic control for   <7.0% adults with diabetes    Mean Plasma Glucose 88.19 mg/dL    Comment: Performed at [facility] [address] [place], [place] [address]  Blood culture (routine x 2)     Status: None (Preliminary result)   Collection Time: 07/13/18 12:57 PM  Result Value Ref Range   Specimen Description BLOOD BLOOD RIGHT FOREARM    Special Requests      BOTTLES DRAWN AEROBIC AND ANAEROBIC Blood Culture results may not be optimal due to an inadequate volume of blood received in culture bottles   Culture      NO GROWTH < 24 HOURS Performed at [facility] [address], [place], [place] [address]    Report Status PENDING   I-Stat CG4 Lactic Acid, ED     Status: Abnormal   Collection Time: 07/13/18  1:31 PM  Result Value Ref Range   Lactic Acid, Venous 2.75 (HH) 0.5 - 1.9 mmol/L   Comment NOTIFIED PHYSICIAN   I-Stat CG4 Lactic Acid, ED     Status: Abnormal   Collection Time: 07/13/18  3:03 PM  Result Value Ref Range   Lactic Acid, Venous 2.33 (HH) 0.5 - 1.9 mmol/L   Comment NOTIFIED PHYSICIAN   HIV antibody     Status: None   Collection Time: 07/13/18  4:30 PM  Result Value Ref Range   HIV Screen 4th Generation wRfx Non Reactive Non Reactive    Comment: (NOTE) Performed At: [facility] [place], [place] [id number] Rush Farmer MD [id number]   Sedimentation rate     Status: Abnormal   Collection Time: 07/13/18  4:30 PM  Result Value Ref Range   Sed Rate 93 (H) 0 - 16 mm/hr    Comment: Performed at [facility] [address], [place], [place] [address]  C-reactive protein     Status: Abnormal   Collection Time: 07/13/18  4:30 PM  Result Value Ref Range   CRP 18.2 (H) <1.0 mg/dL    Comment: Performed at [facility] [address] [place], [place] [address]  Prealbumin     Status: None    Collection Time: 07/13/18  4:30 PM  Result Value Ref Range   Prealbumin 18.4 18 - 38 mg/dL    Comment: Performed at [facility] [address], [place], [place] [address]  CBG monitoring, ED     Status: Abnormal   Collection Time: 07/13/18  4:59 PM  Result Value Ref Range   Glucose-Capillary 122 (H) 70 - 99 mg/dL  I-Stat CG4 Lactic Acid, ED     Status: Abnormal   Collection Time: 07/13/18  6:19 PM  Result Value Ref Range   Lactic Acid, Venous 2.58 (HH) 0.5 - 1.9 mmol/L   Comment NOTIFIED PHYSICIAN   Surgical pcr screen     Status: None   Collection Time: 07/13/18  9:11 PM  Result Value Ref Range   MRSA, PCR NEGATIVE NEGATIVE   Staphylococcus aureus NEGATIVE NEGATIVE    Comment: (NOTE) The Xpert SA Assay (FDA approved for NASAL specimens in patients 73 years of age and older), is one component of a comprehensive surveillance program. It is not intended to diagnose infection nor to guide or monitor treatment. Performed at [facility], [place] [address], [place], [place] [address]   Glucose, capillary     Status: Abnormal   Collection Time: 07/13/18  9:52 PM  Result  Value Ref Range   Glucose-Capillary 114 (H) 70 - 99 mg/dL  Basic metabolic panel     Status: Abnormal   Collection Time: 07/14/18  1:56 AM  Result Value Ref Range   Sodium 139 135 - 145 mmol/L   Potassium 4.7 3.5 - 5.1 mmol/L   Chloride 97 (L) 98 - 111 mmol/L   CO2 24 22 - 32 mmol/L   Glucose, Bld 93 70 - 99 mg/dL   BUN 47 (H) 8 - 23 mg/dL   Creatinine, Ser 9.66 (H) 0.61 - 1.24 mg/dL   Calcium 8.9 8.9 - 10.3 mg/dL   GFR calc non Af Amer 5 (L) >60 mL/min   GFR calc Af Amer 6 (L) >60 mL/min   Anion gap 18 (H) 5 - 15    Comment: Performed at [facility], [place] [address], [place], [place] [address]  CBC     Status: Abnormal   Collection Time: 07/14/18  1:56 AM  Result Value Ref Range   WBC 6.7 4.0 - 10.5 K/uL   RBC 3.90 (L) 4.22 - 5.81 MIL/uL   Hemoglobin 12.5 (L) 13.0 - 17.0 g/dL   HCT 41.5  39.0 - 52.0 %   MCV 106.4 (H) 80.0 - 100.0 fL   MCH 32.1 26.0 - 34.0 pg   MCHC 30.1 30.0 - 36.0 g/dL   RDW 15.8 (H) 11.5 - 15.5 %   Platelets 337 150 - 400 K/uL   nRBC 0.0 0.0 - 0.2 %    Comment: Performed at [facility], [place] [address], [place], [place] [address]  Glucose, capillary     Status: None   Collection Time: 07/14/18  6:25 AM  Result Value Ref Range   Glucose-Capillary 72 70 - 99 mg/dL  Glucose, capillary     Status: Abnormal   Collection Time: 07/14/18 10:27 AM  Result Value Ref Range   Glucose-Capillary 67 (L) 70 - 99 mg/dL   Comment 1 Notify RN     Dg Foot Complete Left  Result Date: 07/13/2018 Please see detailed radiograph report in office note.  Dg Foot Complete Right  Result Date: 07/13/2018 CLINICAL DATA:  Right foot pain after blister popped on right foot above little toe 3 days ago. History of diabetes. EXAM: RIGHT FOOT COMPLETE - 3+ VIEW COMPARISON:  Earlier same day radiographs of the foot. FINDINGS: Soft tissue ulceration is seen along the lateral aspect of the forefoot adjacent to the head of the fifth metatarsal. No radiographic evidence of osteomyelitis. No cortical bone loss deep to the ulceration is identified. Comminuted intra-articular fracture of the head of the first proximal phalanx extending into the interphalangeal joint is noted. Mild degenerative joint space narrowing of the DIP and PIP joints of the second through fifth digits. Extra-articular erosive change along the medial aspect of the first metatarsal head. Midfoot articulations are maintained with tiny accessory ossicle adjacent to the tarsal navicular. Vascular calcifications crossing the ankle joint are identified compatible with history of diabetes. Small calcaneal enthesophytes are noted. Soft tissue calcifications along the course of the Achilles tendon is identified possibly related to old trauma. IMPRESSION: 1. Soft tissue ulceration along the lateral aspect of the forefoot adjacent  to the fifth metatarsal head. No cortical bone loss to suggest radiographic evidence of osteomyelitis. 2. Comminuted intra-articular fracture of the first proximal phalangeal head extending into the interphalangeal joint. No significant callus formation is identified suggesting a recent fracture. 3. Ancillary findings as above. Electronically Signed  By: Ashley Royalty M.D.   On: 07/13/2018 13:37   Dg Foot Complete Right  Result Date: 07/13/2018 Please see detailed radiograph report in office note.   ROS Blood pressure 119/84, pulse 97, temperature 97.9 F (36.6 C), resp. rate 19, weight 81 kg, SpO2 100 %. Physical Exam   Vitals:   07/14/18 1200 07/14/18 1337  BP: (P) 134/88 119/84  Pulse: (P) 98 97  Resp:  19  Temp:  97.9 F (36.6 C)  SpO2:  100%   General AA&O x3. Normal mood and affect. Non toxic appearing.  Vascular Dorsalis pedis and posterior tibial pulses non-palpable Pedal hair growth absent.  Neurologic Epicritic sensation grossly present but diminished  Dermatologic (Wound) Wound Location: Right 5th metatararsal Wound Base: Necrotic eschar Peri-wound: Reddened, Macerated Exudate: Moderate amount Purulent exudate Deep probing but not to bone.  Orthopedic: Motor intact distally.   Assessment/Plan:  R Diabetic Foot Infection complicated by PAD -XR reviewed. No SQ air. No definite osteomyelitis. Vascular calcifications noted. -Bone scan pending, unable to get MRI d/t pacemaker -Labs reviewed. WBC normal, ESR CRP elevated -ABIs pending. Likely superimposed wet gangrene complicating the infection. -Continue empiric abx -Heel WB RLE in surgical shoe. -Surgery canceled today due to HD and pending bone scan. Planned for tomorrow -Bedside Incision and drainage performed. See separate procedure note.. -Would benefit from operative intervention. To OR tomorrow for Debridement and Irrigation, possible 5th metatarsal and or toe amputation, possible wound VAC application. -NPO  lifted today, resume midnight. -I am concerned that pending on arterial status and comorbidities he is at high risk for further amputation but will attempt salvage procedures for now.   Will continue to follow.  [place] 07/14/2018, 2:28 PM

## 2018-07-14 NOTE — Progress Notes (Signed)
PROGRESS NOTE    Timothy Faulkner  YIF:[id number] DOB: Jan 11, 1946 DOA: 07/13/2018 PCP: [name], MD    Brief Narrative:  Timothy Faulkner is a 72 y.o. male with medical history significant of DM; diabetic foot ulcer/PAD; HTN; HLD; ESRD on MWF HD; and CHF with AICD presenting with foot pain.  He started with an ulcer that opened on Saturday, although he had a sore there for a few weeks. He was admitted for possible OM of the right foot.   Assessment & Plan:   Principal Problem:   Diabetic foot infection ([place]) Active Problems:   Essential hypertension   Automatic implantable cardioverter-defibrillator in situ   ESRD (end stage renal disease) on dialysis (HCC)   Cardiomyopathy, dilated, nonischemic (HCC)   Peripheral arterial disease (HCC)   Osteomyelitis of right foot (HCC)   Vascular calcification   Cellulitis and abscess of foot, except toes   Osteomyelitis of the right great toe and surrounding cellulitis of the right foot. :  Admitted for IV antibiotics and Dr March Rummage with Podiatry on board and planning for I&d tomorrow with amputation of the great toe, with bone biopsy and possible wound vac placement.  Continue with IV antibiotics , pain control.  ABI's and results pending.  Bone scan ordered and pending.     ESRD on HD.  Further management as per nephrology.    Hypertension:  Well controlled.    NICM:  Pt currently denies any chest pain or sob.    Mild anemia of chronic disease.    DVT prophylaxis: heparin.  Code Status: full code.  Family Communication: none at bedside.  Disposition Plan:pending podiatry eval and surgery in am.    Consultants:   Podiatry.     Procedures: none.    Antimicrobials: vancomycin , flagyl and rocephin since admission.    Subjective: Requesting to dress his wounds. But no other complaints.   Objective: Vitals:   07/14/18 1100 07/14/18 1130 07/14/18 1200 07/14/18 1337  BP: (P) 123/81 (P) 136/89 (P) 134/88 119/84    Pulse: (P) 99 (P) 99 (P) 98 97  Resp:    19  Temp:    97.9 F (36.6 C)  TempSrc:      SpO2:    100%  Weight:       No intake or output data in the 24 hours ending 07/14/18 1741 Filed Weights   07/14/18 0812  Weight: 81 kg    Examination:  General exam: mild discomfort from the foot pain.  Respiratory system: Clear to auscultation. Respiratory effort normal. Cardiovascular system: S1 & S2 heard, RRR. No JVD,  Gastrointestinal system: Abdomen is nondistended, soft and nontender. No organomegaly or masses felt. Normal bowel sounds heard. Central nervous system: Alert and oriented. No focal neurological deficits. Extremities:  Plantar right foot ulcer with pus drainage with surrounding cellulitis.  Skin: see above.  Psychiatry: Mood & affect appropriate.     Data Reviewed: I have personally reviewed following labs and imaging studies  CBC: Recent Labs  Lab 07/13/18 1252 07/14/18 0156  WBC 7.3 6.7  NEUTROABS 5.7  --   HGB 13.4 12.5*  HCT 43.2 41.5  MCV 108.0* 106.4*  PLT 308 867   Basic Metabolic Panel: Recent Labs  Lab 07/13/18 1252 07/14/18 0156  NA 136 139  K 5.1 4.7  CL 97* 97*  CO2 23 24  GLUCOSE 104* 93  BUN 41* 47*  CREATININE 9.49* 9.66*  CALCIUM 9.0 8.9   GFR: Estimated Creatinine Clearance: 6.7  mL/min (A) (by C-G formula based on SCr of 9.66 mg/dL (H)). Liver Function Tests: Recent Labs  Lab 07/13/18 1252  AST 19  ALT 11  ALKPHOS 56  BILITOT 0.8  PROT 8.1  ALBUMIN 3.1*   No results for input(s): LIPASE, AMYLASE in the last 168 hours. No results for input(s): AMMONIA in the last 168 hours. Coagulation Profile: No results for input(s): INR, PROTIME in the last 168 hours. Cardiac Enzymes: No results for input(s): CKTOTAL, CKMB, CKMBINDEX, TROPONINI in the last 168 hours. BNP (last 3 results) No results for input(s): PROBNP in the last 8760 hours. HbA1C: Recent Labs    07/13/18 1252  HGBA1C 4.7*   CBG: Recent Labs  Lab  07/13/18 2152 07/14/18 0625 07/14/18 1027 07/14/18 1615 07/14/18 1738  GLUCAP 114* 72 67* 71 113*   Lipid Profile: No results for input(s): CHOL, HDL, LDLCALC, TRIG, CHOLHDL, LDLDIRECT in the last 72 hours. Thyroid Function Tests: No results for input(s): TSH, T4TOTAL, FREET4, T3FREE, THYROIDAB in the last 72 hours. Anemia Panel: No results for input(s): VITAMINB12, FOLATE, FERRITIN, TIBC, IRON, RETICCTPCT in the last 72 hours. Sepsis Labs: Recent Labs  Lab 07/13/18 1331 07/13/18 1503 07/13/18 1819  LATICACIDVEN 2.75* 2.33* 2.58*    Recent Results (from the past 240 hour(s))  Blood culture (routine x 2)     Status: None (Preliminary result)   Collection Time: 07/13/18 12:52 PM  Result Value Ref Range Status   Specimen Description BLOOD RIGHT HAND  Final   Special Requests   Final    BOTTLES DRAWN AEROBIC ONLY Blood Culture results may not be optimal due to an inadequate volume of blood received in culture bottles   Culture   Final    NO GROWTH < 24 HOURS Performed at [facility] [address], [place], [place] [address]    Report Status PENDING  Incomplete  Blood culture (routine x 2)     Status: None (Preliminary result)   Collection Time: 07/13/18 12:57 PM  Result Value Ref Range Status   Specimen Description BLOOD BLOOD RIGHT FOREARM  Final   Special Requests   Final    BOTTLES DRAWN AEROBIC AND ANAEROBIC Blood Culture results may not be optimal due to an inadequate volume of blood received in culture bottles   Culture   Final    NO GROWTH < 24 HOURS Performed at [facility], [place] [address] [place], [place] [address]    Report Status PENDING  Incomplete  Surgical pcr screen     Status: None   Collection Time: 07/13/18  9:11 PM  Result Value Ref Range Status   MRSA, PCR NEGATIVE NEGATIVE Final   Staphylococcus aureus NEGATIVE NEGATIVE Final    Comment: (NOTE) The Xpert SA Assay (FDA approved for NASAL specimens in patients 81 years of age and  older), is one component of a comprehensive surveillance program. It is not intended to diagnose infection nor to guide or monitor treatment. Performed at [facility], [place] [address] [place], [place] [address]          Radiology Studies: Dg Foot Complete Left  Result Date: 07/13/2018 Please see detailed radiograph report in office note.  Dg Foot Complete Right  Result Date: 07/13/2018 CLINICAL DATA:  Right foot pain after blister popped on right foot above little toe 3 days ago. History of diabetes. EXAM: RIGHT FOOT COMPLETE - 3+ VIEW COMPARISON:  Earlier same day radiographs of the foot. FINDINGS: Soft tissue ulceration  is seen along the lateral aspect of the forefoot adjacent to the head of the fifth metatarsal. No radiographic evidence of osteomyelitis. No cortical bone loss deep to the ulceration is identified. Comminuted intra-articular fracture of the head of the first proximal phalanx extending into the interphalangeal joint is noted. Mild degenerative joint space narrowing of the DIP and PIP joints of the second through fifth digits. Extra-articular erosive change along the medial aspect of the first metatarsal head. Midfoot articulations are maintained with tiny accessory ossicle adjacent to the tarsal navicular. Vascular calcifications crossing the ankle joint are identified compatible with history of diabetes. Small calcaneal enthesophytes are noted. Soft tissue calcifications along the course of the Achilles tendon is identified possibly related to old trauma. IMPRESSION: 1. Soft tissue ulceration along the lateral aspect of the forefoot adjacent to the fifth metatarsal head. No cortical bone loss to suggest radiographic evidence of osteomyelitis. 2. Comminuted intra-articular fracture of the first proximal phalangeal head extending into the interphalangeal joint. No significant callus formation is identified suggesting a recent fracture. 3. Ancillary findings as above.  Electronically Signed   By: Ashley Royalty M.D.   On: 07/13/2018 13:37   Dg Foot Complete Right  Result Date: 07/13/2018 Please see detailed radiograph report in office note.  Vas [place] Burnard Bunting With/wo Tbi  Result Date: 07/14/2018 LOWER EXTREMITY DOPPLER STUDY Indications: Ulceration. High Risk Factors: Diabetes. Other Factors: Renal Dialysis Pt with lt fistula.  Performing Technologist: Birdena Crandall, [place] [facility]  Examination Guidelines: A complete evaluation includes at minimum, Doppler waveform signals and systolic blood pressure reading at the level of bilateral brachial, anterior tibial, and posterior tibial arteries, when vessel segments are accessible. Bilateral testing is considered an integral part of a complete examination. Photoelectric Plethysmograph (PPG) waveforms and toe systolic pressure readings are included as required and additional duplex testing as needed. Limited examinations for reoccurring indications may be performed as noted.  ABI Findings: +---------+------------------+-----+-------------------+--------+ Right    Rt Pressure (mmHg)IndexWaveform           Comment  +---------+------------------+-----+-------------------+--------+ Brachial 157                    triphasic                   +---------+------------------+-----+-------------------+--------+ PTA      97                0.62 dampened monophasic         +---------+------------------+-----+-------------------+--------+ DP       94                0.60 dampened monophasic         +---------+------------------+-----+-------------------+--------+ Great Toe25                0.16                             +---------+------------------+-----+-------------------+--------+ +---------+------------------+-----+----------+----------------------------+ Left     Lt Pressure (mmHg)IndexWaveform  Comment                      +---------+------------------+-----+----------+----------------------------+ Brachial                                   Resyructed limb wuth fistula +---------+------------------+-----+----------+----------------------------+ PTA      79  0.50 monophasic                             +---------+------------------+-----+----------+----------------------------+ DP       91                0.58 monophasic                             +---------+------------------+-----+----------+----------------------------+ Great Toe255               1.62           Non compressible             +---------+------------------+-----+----------+----------------------------+ +-------+-----------+---------------+------------+------------+ ABI/TBIToday's ABIToday's TBI    Previous ABIPrevious TBI +-------+-----------+---------------+------------+------------+ Right  0.62       0.16                                    +-------+-----------+---------------+------------+------------+ Left   0.58       Noncompressible                         +-------+-----------+---------------+------------+------------+  Summary: Right: Resting right ankle-brachial index indicates moderate right lower extremity arterial disease. The right toe-brachial index is abnormal. Left: Resting left ankle-brachial index indicates moderate left lower extremity arterial disease. TBIs are unreliable.  *See table(s) above for measurements and observations.  Electronically signed by Quay Burow MD on 07/14/2018 at 4:46:20 PM.    Final         Scheduled Meds: . aspirin EC  81 mg Oral Daily  . Chlorhexidine Gluconate Cloth  6 each Topical Q0600  . cinacalcet  30 mg Oral Q breakfast  . feeding supplement (NEPRO CARB STEADY)  237 mL Oral Q24H  . feeding supplement (PRO-STAT SUGAR FREE 64)  30 mL Oral BID WC  . ferric citrate  630 mg Oral TID WC  . gabapentin  100 mg Oral QHS  . heparin      . heparin      . heparin  5,000 Units Subcutaneous Q8H  . insulin aspart  0-15 Units Subcutaneous TID WC  . insulin  aspart  0-5 Units Subcutaneous QHS  . lidocaine (PF)      . lidocaine  20 mL Infiltration Once  . midodrine  10 mg Oral Once per day on Mon Wed Fri  . multivitamin  1 tablet Oral QHS   Continuous Infusions: . cefTRIAXone (ROCEPHIN)  IV Stopped (07/13/18 1905)  . metronidazole 500 mg (07/14/18 0600)  . vancomycin 750 mg (07/14/18 1126)     LOS: 1 day    Time spent: 32 minutes.     Hosie Poisson, MD [facility] Pager [phone]   If 7PM-7AM, please contact night-coverage [email or web address] Password [email or web address] 07/14/2018, 5:41 PM

## 2018-07-14 NOTE — Progress Notes (Signed)
Attempted to see patient - off the floor for HD. Will see patient later this morning.   Dr. Hardie Pulley

## 2018-07-14 NOTE — H&P (View-Only) (Signed)
Reason for Consult: R foot infection Referring Physician: [name] is an 72 y.o. male.  HPI: 72 year old male seen yesterday in the office, sent for concern due to infected blistered area to the right foot. Sore has been present for weeks but the ulceration worsened on Saturday. Has not been feeling well for several weeks and has been attempting self care for the ulceration.   Attempted to see patient earlier today, was at HD.   Past Medical History:  Diagnosis Date  . Anemia   . Arthritis    Gout  . Automatic implantable cardioverter-defibrillator in situ    [employer]  . CHF   . ESRD on dialysis [facility])    [name] 03/11/2013 (03/11/2013) dialysis M/W/F  . GERD (gastroesophageal reflux disease)   . Gout    "once a year"  . Heart murmur   . HYPERCHOLESTEROLEMIA, MIXED   . HYPERTENSION   . Macular degeneration   . 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 Pacemaker   . Peripheral arterial disease (HCC)    left fifth toe ulcer, healing  . Pneumonia   . Shortness of breath   . 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 AV FISTULA PLACEMENT Right 12/13/2012   Procedure: ARTERIOVENOUS (AV) FISTULA CREATION;  Surgeon: [name], MD;  Location: [place];  Service: Vascular;  Laterality: Right;  Ultrasound guided  . AV FISTULA PLACEMENT Left 05/07/2016   Procedure: LEFT RADIOCEPHALIC ARTERIOVENOUS (AV) FISTULA CREATION;  Surgeon: [name], MD;  Location: [place];  Service: Vascular;  Laterality: Left;  . BASCILIC VEIN TRANSPOSITION Right 03/26/2016   Procedure: RIGHT BASILIC VEIN TRANSPOSITION;  Surgeon: [name], MD;  Location: [place];  Service: Vascular;  Laterality: Right;  . CARDIAC CATHETERIZATION    . CARDIAC DEFIBRILLATOR PLACEMENT     Chemical engineer  . EYE SURGERY Bilateral    Cataract  . FISTULOGRAM Left 04/22/2018   Procedure: FISTULOGRAM UPPER EXTREMITY;   Surgeon: [name], MD;  Location: [place];  Service: Vascular;  Laterality: Left;  . INCISION AND DRAINAGE ABSCESS / HEMATOMA OF BURSA / KNEE / THIGH Left 2012   "knee" (03/11/2013)  . INSERT / REPLACE / REMOVE PACEMAKER    . INSERTION OF DIALYSIS CATHETER Left 04/22/2018   Procedure: INSERTION OF DIALYSIS CATHETER;  Surgeon: [name], MD;  Location: [place];  Service: Vascular;  Laterality: Left;  . REVISON OF ARTERIOVENOUS FISTULA Right 5/68/1275   Procedure: Plication OF Right Arm ARTERIOVENOUS FISTULA;  Surgeon: Elam Dutch, MD;  Location: [facility] OR;  Service: Vascular;  Laterality: Right;  . REVISON OF ARTERIOVENOUS FISTULA Left 04/22/2018   Procedure: REVISION OF RADIOCEPHALIC ARTERIOVENOUS FISTULA;  Surgeon: [name], MD;  Location: [place];  Service: Vascular;  Laterality: Left;  . SHUNTOGRAM N/A 05/31/2013   Procedure: Earney Mallet;  Surgeon: [name], MD;  Location: [facility] CATH LAB;  Service: Cardiovascular;  Laterality: N/A;    Family History  Problem Relation Age of Onset  . Heart disease Father   . CAD Father     Social History:  reports that he quit smoking about 25 years ago. His smoking use included cigarettes. He has a 22.50 pack-year smoking history. He has never used smokeless tobacco. He reports that he drank alcohol. He reports that he has current or past drug history. Drug:  Marijuana.  Allergies:  Allergies  Allergen Reactions  . No Known Allergies     Medications: I have reviewed the patient's current medications.  Results for orders placed or performed during the hospital encounter of 07/13/18 (from the past 48 hour(s))  CBC with Differential     Status: Abnormal   Collection Time: 07/13/18 12:52 PM  Result Value Ref Range   WBC 7.3 4.0 - 10.5 K/uL   RBC 4.00 (L) 4.22 - 5.81 MIL/uL   Hemoglobin 13.4 13.0 - 17.0 g/dL   HCT 43.2 39.0 - 52.0 %   MCV 108.0 (H) 80.0 - 100.0 fL   MCH 33.5 26.0 - 34.0 pg   MCHC 31.0 30.0 - 36.0 g/dL    RDW 15.9 (H) 11.5 - 15.5 %   Platelets 308 150 - 400 K/uL   nRBC 0.0 0.0 - 0.2 %   Neutrophils Relative % 78 %   Neutro Abs 5.7 1.7 - 7.7 K/uL   Lymphocytes Relative 11 %   Lymphs Abs 0.8 0.7 - 4.0 K/uL   Monocytes Relative 8 %   Monocytes Absolute 0.6 0.1 - 1.0 K/uL   Eosinophils Relative 1 %   Eosinophils Absolute 0.1 0.0 - 0.5 K/uL   Basophils Relative 1 %   Basophils Absolute 0.0 0.0 - 0.1 K/uL   Immature Granulocytes 1 %   Abs Immature Granulocytes 0.08 (H) 0.00 - 0.07 K/uL    Comment: Performed at [facility], [address], [place], [place] [address]  Comprehensive metabolic panel     Status: Abnormal   Collection Time: 07/13/18 12:52 PM  Result Value Ref Range   Sodium 136 135 - 145 mmol/L   Potassium 5.1 3.5 - 5.1 mmol/L   Chloride 97 (L) 98 - 111 mmol/L   CO2 23 22 - 32 mmol/L   Glucose, Bld 104 (H) 70 - 99 mg/dL   BUN 41 (H) 8 - 23 mg/dL   Creatinine, Ser 9.49 (H) 0.61 - 1.24 mg/dL   Calcium 9.0 8.9 - 10.3 mg/dL   Total Protein 8.1 6.5 - 8.1 g/dL   Albumin 3.1 (L) 3.5 - 5.0 g/dL   AST 19 15 - 41 U/L   ALT 11 0 - 44 U/L   Alkaline Phosphatase 56 38 - 126 U/L   Total Bilirubin 0.8 0.3 - 1.2 mg/dL   GFR calc non Af Amer 5 (L) >60 mL/min   GFR calc Af Amer 6 (L) >60 mL/min   Anion gap 16 (H) 5 - 15    Comment: Performed at [facility], [place] [address], [place], [place] [address]  Blood culture (routine x 2)     Status: None (Preliminary result)   Collection Time: 07/13/18 12:52 PM  Result Value Ref Range   Specimen Description BLOOD RIGHT HAND    Special Requests      BOTTLES DRAWN AEROBIC ONLY Blood Culture results may not be optimal due to an inadequate volume of blood received in culture bottles   Culture      NO GROWTH < 24 HOURS Performed at [facility] [address] [place], [place] [address]    Report Status PENDING   Hemoglobin A1c     Status: Abnormal   Collection Time: 07/13/18 12:52 PM  Result Value Ref Range   Hgb A1c  MFr Bld 4.7 (L) 4.8 - 5.6 %    Comment: (NOTE) Pre diabetes:          5.7%-6.4% Diabetes:              >  6.4% Glycemic control for   <7.0% adults with diabetes    Mean Plasma Glucose 88.19 mg/dL    Comment: Performed at [facility] [address] [place], [place] [address]  Blood culture (routine x 2)     Status: None (Preliminary result)   Collection Time: 07/13/18 12:57 PM  Result Value Ref Range   Specimen Description BLOOD BLOOD RIGHT FOREARM    Special Requests      BOTTLES DRAWN AEROBIC AND ANAEROBIC Blood Culture results may not be optimal due to an inadequate volume of blood received in culture bottles   Culture      NO GROWTH < 24 HOURS Performed at [facility] [address], [place], [place] [address]    Report Status PENDING   I-Stat CG4 Lactic Acid, ED     Status: Abnormal   Collection Time: 07/13/18  1:31 PM  Result Value Ref Range   Lactic Acid, Venous 2.75 (HH) 0.5 - 1.9 mmol/L   Comment NOTIFIED PHYSICIAN   I-Stat CG4 Lactic Acid, ED     Status: Abnormal   Collection Time: 07/13/18  3:03 PM  Result Value Ref Range   Lactic Acid, Venous 2.33 (HH) 0.5 - 1.9 mmol/L   Comment NOTIFIED PHYSICIAN   HIV antibody     Status: None   Collection Time: 07/13/18  4:30 PM  Result Value Ref Range   HIV Screen 4th Generation wRfx Non Reactive Non Reactive    Comment: (NOTE) Performed At: [facility] [place], [place] [id number] Rush Farmer MD JP:[id number]   Sedimentation rate     Status: Abnormal   Collection Time: 07/13/18  4:30 PM  Result Value Ref Range   Sed Rate 93 (H) 0 - 16 mm/hr    Comment: Performed at [facility] [address], [place], [place] [address]  C-reactive protein     Status: Abnormal   Collection Time: 07/13/18  4:30 PM  Result Value Ref Range   CRP 18.2 (H) <1.0 mg/dL    Comment: Performed at [facility] [address] [place], [place] [address]  Prealbumin     Status: None    Collection Time: 07/13/18  4:30 PM  Result Value Ref Range   Prealbumin 18.4 18 - 38 mg/dL    Comment: Performed at [facility] [address], [place], [place] [address]  CBG monitoring, ED     Status: Abnormal   Collection Time: 07/13/18  4:59 PM  Result Value Ref Range   Glucose-Capillary 122 (H) 70 - 99 mg/dL  I-Stat CG4 Lactic Acid, ED     Status: Abnormal   Collection Time: 07/13/18  6:19 PM  Result Value Ref Range   Lactic Acid, Venous 2.58 (HH) 0.5 - 1.9 mmol/L   Comment NOTIFIED PHYSICIAN   Surgical pcr screen     Status: None   Collection Time: 07/13/18  9:11 PM  Result Value Ref Range   MRSA, PCR NEGATIVE NEGATIVE   Staphylococcus aureus NEGATIVE NEGATIVE    Comment: (NOTE) The Xpert SA Assay (FDA approved for NASAL specimens in patients 73 years of age and older), is one component of a comprehensive surveillance program. It is not intended to diagnose infection nor to guide or monitor treatment. Performed at [facility], [place] [address], [place], [place] [address]   Glucose, capillary     Status: Abnormal   Collection Time: 07/13/18  9:52 PM  Result  Value Ref Range   Glucose-Capillary 114 (H) 70 - 99 mg/dL  Basic metabolic panel     Status: Abnormal   Collection Time: 07/14/18  1:56 AM  Result Value Ref Range   Sodium 139 135 - 145 mmol/L   Potassium 4.7 3.5 - 5.1 mmol/L   Chloride 97 (L) 98 - 111 mmol/L   CO2 24 22 - 32 mmol/L   Glucose, Bld 93 70 - 99 mg/dL   BUN 47 (H) 8 - 23 mg/dL   Creatinine, Ser 9.66 (H) 0.61 - 1.24 mg/dL   Calcium 8.9 8.9 - 10.3 mg/dL   GFR calc non Af Amer 5 (L) >60 mL/min   GFR calc Af Amer 6 (L) >60 mL/min   Anion gap 18 (H) 5 - 15    Comment: Performed at [facility], [place] [address], [place], [place] [address]  CBC     Status: Abnormal   Collection Time: 07/14/18  1:56 AM  Result Value Ref Range   WBC 6.7 4.0 - 10.5 K/uL   RBC 3.90 (L) 4.22 - 5.81 MIL/uL   Hemoglobin 12.5 (L) 13.0 - 17.0 g/dL   HCT 41.5  39.0 - 52.0 %   MCV 106.4 (H) 80.0 - 100.0 fL   MCH 32.1 26.0 - 34.0 pg   MCHC 30.1 30.0 - 36.0 g/dL   RDW 15.8 (H) 11.5 - 15.5 %   Platelets 337 150 - 400 K/uL   nRBC 0.0 0.0 - 0.2 %    Comment: Performed at [facility], [place] [address], [place], [place] [address]  Glucose, capillary     Status: None   Collection Time: 07/14/18  6:25 AM  Result Value Ref Range   Glucose-Capillary 72 70 - 99 mg/dL  Glucose, capillary     Status: Abnormal   Collection Time: 07/14/18 10:27 AM  Result Value Ref Range   Glucose-Capillary 67 (L) 70 - 99 mg/dL   Comment 1 Notify RN     Dg Foot Complete Left  Result Date: 07/13/2018 Please see detailed radiograph report in office note.  Dg Foot Complete Right  Result Date: 07/13/2018 CLINICAL DATA:  Right foot pain after blister popped on right foot above little toe 3 days ago. History of diabetes. EXAM: RIGHT FOOT COMPLETE - 3+ VIEW COMPARISON:  Earlier same day radiographs of the foot. FINDINGS: Soft tissue ulceration is seen along the lateral aspect of the forefoot adjacent to the head of the fifth metatarsal. No radiographic evidence of osteomyelitis. No cortical bone loss deep to the ulceration is identified. Comminuted intra-articular fracture of the head of the first proximal phalanx extending into the interphalangeal joint is noted. Mild degenerative joint space narrowing of the DIP and PIP joints of the second through fifth digits. Extra-articular erosive change along the medial aspect of the first metatarsal head. Midfoot articulations are maintained with tiny accessory ossicle adjacent to the tarsal navicular. Vascular calcifications crossing the ankle joint are identified compatible with history of diabetes. Small calcaneal enthesophytes are noted. Soft tissue calcifications along the course of the Achilles tendon is identified possibly related to old trauma. IMPRESSION: 1. Soft tissue ulceration along the lateral aspect of the forefoot adjacent  to the fifth metatarsal head. No cortical bone loss to suggest radiographic evidence of osteomyelitis. 2. Comminuted intra-articular fracture of the first proximal phalangeal head extending into the interphalangeal joint. No significant callus formation is identified suggesting a recent fracture. 3. Ancillary findings as above. Electronically Signed  By: Ashley Royalty M.D.   On: 07/13/2018 13:37   Dg Foot Complete Right  Result Date: 07/13/2018 Please see detailed radiograph report in office note.   ROS Blood pressure 119/84, pulse 97, temperature 97.9 F (36.6 C), resp. rate 19, weight 81 kg, SpO2 100 %. Physical Exam   Vitals:   07/14/18 1200 07/14/18 1337  BP: (P) 134/88 119/84  Pulse: (P) 98 97  Resp:  19  Temp:  97.9 F (36.6 C)  SpO2:  100%   General AA&O x3. Normal mood and affect. Non toxic appearing.  Vascular Dorsalis pedis and posterior tibial pulses non-palpable Pedal hair growth absent.  Neurologic Epicritic sensation grossly present but diminished  Dermatologic (Wound) Wound Location: Right 5th metatararsal Wound Base: Necrotic eschar Peri-wound: Reddened, Macerated Exudate: Moderate amount Purulent exudate Deep probing but not to bone.  Orthopedic: Motor intact distally.   Assessment/Plan:  R Diabetic Foot Infection complicated by PAD -XR reviewed. No SQ air. No definite osteomyelitis. Vascular calcifications noted. -Bone scan pending, unable to get MRI d/t pacemaker -Labs reviewed. WBC normal, ESR CRP elevated -ABIs pending. Likely superimposed wet gangrene complicating the infection. -Continue empiric abx -Heel WB RLE in surgical shoe. -Surgery canceled today due to HD and pending bone scan. Planned for tomorrow -Bedside Incision and drainage performed. See separate procedure note.. -Would benefit from operative intervention. To OR tomorrow for Debridement and Irrigation, possible 5th metatarsal and or toe amputation, possible wound VAC application. -NPO  lifted today, resume midnight. -I am concerned that pending on arterial status and comorbidities he is at high risk for further amputation but will attempt salvage procedures for now.   Will continue to follow.  [place] 07/14/2018, 2:28 PM

## 2018-07-14 NOTE — Progress Notes (Signed)
Inpatient Diabetes Program Recommendations  AACE/ADA: New Consensus Statement on Inpatient Glycemic Control (2015)  Target Ranges:  Prepandial:   less than 140 mg/dL      Peak postprandial:   less than 180 mg/dL (1-2 hours)      Critically ill patients:  140 - 180 mg/dL   Lab Results  Component Value Date   GLUCAP 72 07/14/2018   HGBA1C 4.7 (L) 07/13/2018    Review of Glycemic Control  Diabetes history: DM2 Outpatient Diabetes medications: None Current orders for Inpatient glycemic control: Novolog 0-15 units tidwc and hs   Inpatient Diabetes Program Recommendations:     Decrease Novolog to 0-9 units tidwc and hs, since pt is sensitive to insulin.  Continue to follow.  Thank you. Lorenda Peck, RD, LDN, CDE Inpatient Diabetes Coordinator [phone]

## 2018-07-14 NOTE — Progress Notes (Addendum)
Initial Nutrition Assessment  DOCUMENTATION CODES:   Not applicable  INTERVENTION:    Pro-stat 30 ml BID with meals, each supplement provides 100 kcal and 15 gm protein  Nepro Shake po once daily, each supplement provides 425 kcal and 19 grams protein  Renal MVI  NUTRITION DIAGNOSIS:   Increased nutrient needs related to wound healing as evidenced by estimated needs.  GOAL:   Patient will meet greater than or equal to 90% of their needs  MONITOR:   PO intake, Supplement acceptance, Labs, Skin  REASON FOR ASSESSMENT:   Consult Wound healing  ASSESSMENT:   72 yo male with PMH of ESRD-on HD, DM-2, HTN, PVD, HLD, CHF, AICD who was admitted on 12/3 with AMS and drainage from right foot ulcer.  Spoke with patient and his daughter. Patient typically has a great appetite and "I eat whatever I want." He was not interested in diet education at this time. He receives HD M-W-F and weights have been stable. EDW 79.5 kg per Nephrology notes. He is able to ambulate on his own at home.   Patient going for tunneled IJ catheter exchange this afternoon due to malfunction.  S/P HD this morning.   Labs reviewed. Corrected calcium 9.62 CBG's: 72-67 today Medications reviewed and include Sensipar, ferric citrate, novolog.  Weight stable for the past 5 months per review of weight encounters.   NUTRITION - FOCUSED PHYSICAL EXAM:    Most Recent Value  Orbital Region  No depletion  Upper Arm Region  No depletion  Thoracic and Lumbar Region  No depletion  Buccal Region  No depletion  Temple Region  No depletion  Clavicle Bone Region  No depletion  Clavicle and Acromion Bone Region  No depletion  Scapular Bone Region  No depletion  Dorsal Hand  No depletion  Patellar Region  Moderate depletion  Anterior Thigh Region  Unable to assess  Posterior Calf Region  Moderate depletion  Edema (RD Assessment)  None  Hair  Reviewed  Eyes  Reviewed  Mouth  Reviewed  Skin  Reviewed  Nails   Reviewed       Diet Order:   Diet Order            Diet NPO time specified  Diet effective midnight        Diet renal/carb modified with fluid restriction Fluid restriction: 1200 mL Fluid; Room service appropriate? Yes; Fluid consistency: Thin  Diet effective now              EDUCATION NEEDS:   Not appropriate for education at this time  Skin:  Skin Assessment: Skin Integrity Issues: Skin Integrity Issues:: Diabetic Ulcer Diabetic Ulcer: R foot  Last BM:  12/3  Height:   Ht Readings from Last 1 Encounters:  06/29/18 5\' 8"  (1.727 m)    Weight:   Wt Readings from Last 1 Encounters:  07/14/18 81 kg   EDW: 79.5 kg  Ideal Body Weight:  70 kg  BMI:  Body mass index is 27.15 kg/m.  Estimated Nutritional Needs:   Kcal:  2200-2500  Protein:  110-120 gm  Fluid:  1 L    Timothy Faulkner, RD, LDN, [place] Pager [phone] After Hours Pager [phone]

## 2018-07-14 NOTE — Progress Notes (Signed)
Patient to ABI studies.

## 2018-07-14 NOTE — Progress Notes (Signed)
Dressing to R foot changed. Scant drainage noted. Gauze, abd pad, kerlex, and ace wrap applied. Pt tolerated well. Will continue to monitor.

## 2018-07-14 NOTE — Progress Notes (Signed)
Patient to HD.  Has been NPO since midnight for sx this afternoon.

## 2018-07-14 NOTE — Consult Note (Signed)
Reason for Consult: Continuity of ESRD care Referring Physician: Karmen Bongo, MD [facility])  HPI:  72 year old African-American man with past medical history significant for type 2 diabetes mellitus, hypertension, peripheral vascular disease, dyslipidemia, congestive heart failure status post AICD and end-stage renal disease on hemodialysis on a Monday/Wednesday/Friday schedule.  Presented to the emergency room yesterday with altered mentation and purulent, foul-smelling drainage from an ulcer on his right foot that started 4 days ago.  He has had unfortunately delayed recognition of symptoms because neuropathy has rendered his right foot numb.  He denies any fever but had some chills.  Denies any nausea, vomiting or diarrhea.  Denies any chest pain or shortness of breath.  He has been having recent problems with poor flows from his left IJ TDC.  Dialysis prescription: Monday/Wednesday/Friday, [facility], 4 hours, BFR 450, DFR 600, EDW 79.5 kg, 3K/2 calcium, heparin 5000 unit bolus, calcitriol 1 mcg p.o. 3 times weekly, left IJ [facility]  Past Medical History:  Diagnosis Date  . Anemia   . Arthritis    Gout  . Automatic implantable cardioverter-defibrillator in situ    [employer]  . CHF   . ESRD on dialysis [facility])    [name] 03/11/2013 (03/11/2013) dialysis M/W/F  . GERD (gastroesophageal reflux disease)   . Gout    "once a year"  . Heart murmur   . HYPERCHOLESTEROLEMIA, MIXED   . HYPERTENSION   . Macular degeneration   . 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 Pacemaker   . Peripheral arterial disease (HCC)    left fifth toe ulcer, healing  . Pneumonia   . Shortness of breath   . 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 AV FISTULA PLACEMENT Right 12/13/2012   Procedure: ARTERIOVENOUS (AV) FISTULA CREATION;  Surgeon: [name], MD;  Location: [place];  Service:  Vascular;  Laterality: Right;  Ultrasound guided  . AV FISTULA PLACEMENT Left 05/07/2016   Procedure: LEFT RADIOCEPHALIC ARTERIOVENOUS (AV) FISTULA CREATION;  Surgeon: [name], MD;  Location: [place];  Service: Vascular;  Laterality: Left;  . BASCILIC VEIN TRANSPOSITION Right 03/26/2016   Procedure: RIGHT BASILIC VEIN TRANSPOSITION;  Surgeon: [name], MD;  Location: [place];  Service: Vascular;  Laterality: Right;  . CARDIAC CATHETERIZATION    . CARDIAC DEFIBRILLATOR PLACEMENT     Chemical engineer  . EYE SURGERY Bilateral    Cataract  . FISTULOGRAM Left 04/22/2018   Procedure: FISTULOGRAM UPPER EXTREMITY;  Surgeon: [name], MD;  Location: [place];  Service: Vascular;  Laterality: Left;  . INCISION AND DRAINAGE ABSCESS / HEMATOMA OF BURSA / KNEE / THIGH Left 2012   "knee" (03/11/2013)  . INSERT / REPLACE / REMOVE PACEMAKER    . INSERTION OF DIALYSIS CATHETER Left 04/22/2018   Procedure: INSERTION OF DIALYSIS CATHETER;  Surgeon: [name], MD;  Location: [place];  Service: Vascular;  Laterality: Left;  . REVISON OF ARTERIOVENOUS FISTULA Right 11/22/2438   Procedure: Plication OF Right Arm ARTERIOVENOUS FISTULA;  Surgeon: Elam Dutch, MD;  Location: [facility] OR;  Service: Vascular;  Laterality: Right;  . REVISON OF ARTERIOVENOUS FISTULA Left 04/22/2018   Procedure: REVISION OF RADIOCEPHALIC ARTERIOVENOUS FISTULA;  Surgeon: [name], MD;  Location: [place];  Service: Vascular;  Laterality: Left;  . SHUNTOGRAM N/A 05/31/2013   Procedure: Earney Mallet;  Surgeon: [name],  MD;  Location: [place] CATH LAB;  Service: Cardiovascular;  Laterality: N/A;    Family History  Problem Relation Age of Onset  . Heart disease Father   . CAD Father     Social History:  reports that he quit smoking about 25 years ago. His smoking use included cigarettes. He has a 22.50 pack-year smoking history. He has never used smokeless tobacco. He reports that he drank alcohol. He reports  that he has current or past drug history. Drug: Marijuana.  Allergies:  Allergies  Allergen Reactions  . No Known Allergies     Medications:  Scheduled: . aspirin EC  81 mg Oral Daily  . Chlorhexidine Gluconate Cloth  6 each Topical Q0600  . cinacalcet  30 mg Oral Q breakfast  . ferric citrate  630 mg Oral TID WC  . gabapentin  100 mg Oral QHS  . heparin  5,000 Units Subcutaneous Q8H  . insulin aspart  0-15 Units Subcutaneous TID WC  . insulin aspart  0-5 Units Subcutaneous QHS  . midodrine  10 mg Oral Once per day on Mon Wed Fri    BMP Latest Ref Rng & Units 07/14/2018 07/13/2018 05/11/2018  Glucose 70 - 99 mg/dL 93 104(H) 115(H)  BUN 8 - 23 mg/dL 47(H) 41(H) 38(H)  Creatinine 0.61 - 1.24 mg/dL 9.66(H) 9.49(H) 9.49(HH)  Sodium 135 - 145 mmol/L 139 136 138  Potassium 3.5 - 5.1 mmol/L 4.7 5.1 5.0  Chloride 98 - 111 mmol/L 97(L) 97(L) 98  CO2 22 - 32 mmol/L 24 23 26   Calcium 8.9 - 10.3 mg/dL 8.9 9.0 9.5   CBC Latest Ref Rng & Units 07/14/2018 07/13/2018 05/11/2018  WBC 4.0 - 10.5 K/uL 6.7 7.3 6.0  Hemoglobin 13.0 - 17.0 g/dL 12.5(L) 13.4 15.2  Hematocrit 39.0 - 52.0 % 41.5 43.2 47.0  Platelets 150 - 400 K/uL 337 308 245     Dg Foot Complete Left  Result Date: 07/13/2018 Please see detailed radiograph report in office note.  Dg Foot Complete Right  Result Date: 07/13/2018 CLINICAL DATA:  Right foot pain after blister popped on right foot above little toe 3 days ago. History of diabetes. EXAM: RIGHT FOOT COMPLETE - 3+ VIEW COMPARISON:  Earlier same day radiographs of the foot. FINDINGS: Soft tissue ulceration is seen along the lateral aspect of the forefoot adjacent to the head of the fifth metatarsal. No radiographic evidence of osteomyelitis. No cortical bone loss deep to the ulceration is identified. Comminuted intra-articular fracture of the head of the first proximal phalanx extending into the interphalangeal joint is noted. Mild degenerative joint space narrowing of the DIP  and PIP joints of the second through fifth digits. Extra-articular erosive change along the medial aspect of the first metatarsal head. Midfoot articulations are maintained with tiny accessory ossicle adjacent to the tarsal navicular. Vascular calcifications crossing the ankle joint are identified compatible with history of diabetes. Small calcaneal enthesophytes are noted. Soft tissue calcifications along the course of the Achilles tendon is identified possibly related to old trauma. IMPRESSION: 1. Soft tissue ulceration along the lateral aspect of the forefoot adjacent to the fifth metatarsal head. No cortical bone loss to suggest radiographic evidence of osteomyelitis. 2. Comminuted intra-articular fracture of the first proximal phalangeal head extending into the interphalangeal joint. No significant callus formation is identified suggesting a recent fracture. 3. Ancillary findings as above. Electronically Signed   By: Ashley Royalty M.D.   On: 07/13/2018 13:37   Dg Foot Complete Right  Result  Date: 07/13/2018 Please see detailed radiograph report in office note.   Review of Systems  Constitutional: Positive for chills and malaise/fatigue. Negative for fever.  HENT: Negative.   Eyes: Negative.   Respiratory: Negative.   Cardiovascular: Negative.   Gastrointestinal: Negative.   Genitourinary: Negative.   Musculoskeletal: Negative.   Skin:       Draining ulcer right foot  Neurological: Negative.    Blood pressure (!) (P) 143/86, pulse (P) 86, temperature (P) 98.5 F (36.9 C), temperature source (P) Oral, resp. rate (P) 16, SpO2 99 %. Physical Exam  Nursing note and vitals reviewed. Constitutional: He is oriented to person, place, and time. He appears well-developed and well-nourished. No distress.  HENT:  Head: Normocephalic and atraumatic.  Mouth/Throat: Oropharynx is clear and moist.  Eyes: Pupils are equal, round, and reactive to light. Conjunctivae and EOM are normal. No scleral  icterus.  Neck: Normal range of motion. Neck supple. No JVD present.  Cardiovascular: Normal rate and regular rhythm.  Murmur heard. 2/6 ejection systolic murmur  Respiratory: Effort normal and breath sounds normal. He has no wheezes. He has no rales.  Left IJ TDC  GI: Soft. Bowel sounds are normal. There is no tenderness. There is no rebound and no guarding.  Musculoskeletal: He exhibits no edema.  Short segment left BCF over antecubital region palpable  Neurological: He is alert and oriented to person, place, and time.  Skin: Skin is dry. No erythema.  Psychiatric: He has a normal mood and affect.    Assessment/Plan: 1.  Diabetic foot infection with draining dorsal lateral wound: On broad-spectrum antimicrobial coverage with Rocephin, metronidazole and vancomycin and is scheduled to have a nuclear scan of his leg/foot to assess for osteomyelitis along with ankle-brachial indices to assess for vascular integrity.  He will be seen additionally by podiatry to assess extent/need for surgical intervention. 2.  End-stage renal disease: Continue hemodialysis on his usual outpatient Monday/Wednesday/Friday schedule.  Will order replacement of his tunneled hemodialysis catheter given problems with flows not improving with alteplase. 3.  Hypertension: Blood pressure slightly elevated-continue Midodrine to allow for ultrafiltration with hemodialysis. 4.  Anemia of chronic kidney disease: Hemoglobin/hematocrit currently at goal, monitor closely especially if undergoes surgical intervention for postop anemia. 5.  Secondary hyperparathyroidism: Continue Auryxia for phosphorus binding, continue Sensipar for PTH suppression. 6.  History of peripheral vascular disease, cardiomyopathy status post AICD  Raiden Haydu K. 07/14/2018, 9:34 AM

## 2018-07-14 NOTE — Progress Notes (Signed)
ABIs completed - Preliminary results found in CV Proc

## 2018-07-14 NOTE — Procedures (Signed)
Patient Name: Timothy Faulkner DOB: Sep 08, 1945  MRN: [id number]   Date of Service: 07/14/18   Surgeon: Dr. Hardie Pulley, DPM Assistants: None Pre-operative Diagnosis: Cellulitis and abscess of foot, diabetic foot infection Post-operative Diagnosis: Same Procedures:             1) Incision and drainage right foot Pathology/Specimens: none Anesthesia: Local block with 10 cc of lidocaine 1% plain Hemostasis: Anatomic Estimated Blood Loss: 3 mL Materials: None Medications: None Complications: None  Indications for Procedure:  This is a 72 y.o. male with a right foot infection with concern for osteomyelitis.  Plan for surgery today however had hemodialysis this a.m. and is pending  bone scan to evaluate for osteomyelitis.  Also pending vascular studies to evaluate healing potential right lower extremity bedside procedure indicated to remove purulent and necrotic material until further surgical intervention can be performed.  Procedure in Detail: Consent reviewed with patient and daughter. Timeout performed prior to procedure.  Following sterile skin prep the area was anesthetized with 10 cc of lidocaine 1% plain.  The foot was then prepped again with ChloraPrep.  An incision was made overlying the fluctuant area about the fifth metatarsal.  Dissection was continued down with scissor and forcep through the deep fascia.  Significant necrosis was encountered and was sharply excised with forcep and pickup.  Purulence was expressed from the wound site.  The wound area was thoroughly explored to allow all areas of purulence to open and drain. The foot was then thoroughly irrigated with 500 ml sterile water.  The area was then dressed with 4 x 4 Kerlix ABD and Ace bandage.  Patient tolerated procedure well  Disposition: 2 OR tomorrow for formal debridement irrigation possible amputation of the fifth metatarsal and/or toe, possible bone biopsy, possible wound VAC application.  Unlikely that the area  will be amenable for closure tomorrow.

## 2018-07-14 NOTE — Consult Note (Addendum)
WOC consult requested for right foot prior to podiatry involvement.  They are now planning to follow patient, according to the progress notes, and their consult is pending. Please refer to their team for further questions regarding plan of care. Please re-consult if further assistance is needed.  Thank-you,  Julien Girt MSN, [place], [place], [place], [place]

## 2018-07-14 NOTE — Progress Notes (Signed)
IR aware of request for tunneled IJ exchange - patient in HD earlier and going for surgical procedure this afternoon. Will plan for exchange with local anesthesia only tomorrow in IR - will see for full consult AM of 12/5.  Please call IR with questions or concerns.  Candiss Norse, PA-C

## 2018-07-15 ENCOUNTER — Inpatient Hospital Stay (HOSPITAL_COMMUNITY): Payer: Medicare Other

## 2018-07-15 ENCOUNTER — Inpatient Hospital Stay (HOSPITAL_COMMUNITY): Payer: Medicare Other | Admitting: Certified Registered Nurse Anesthetist

## 2018-07-15 ENCOUNTER — Encounter (HOSPITAL_COMMUNITY): Payer: Self-pay | Admitting: *Deleted

## 2018-07-15 ENCOUNTER — Encounter (HOSPITAL_COMMUNITY)
Admission: EM | Disposition: A | Discharge status: Home Health | Payer: Self-pay | Source: Home / Self Care | Attending: Internal Medicine

## 2018-07-15 DIAGNOSIS — E08621 Diabetes mellitus due to underlying condition with foot ulcer: Secondary | ICD-10-CM

## 2018-07-15 DIAGNOSIS — L97404 Non-pressure chronic ulcer of unspecified heel and midfoot with necrosis of bone: Secondary | ICD-10-CM

## 2018-07-15 DIAGNOSIS — M86171 Other acute osteomyelitis, right ankle and foot: Secondary | ICD-10-CM

## 2018-07-15 HISTORY — PX: METATARSAL HEAD EXCISION: SHX5027

## 2018-07-15 HISTORY — PX: I & D EXTREMITY: SHX5045

## 2018-07-15 HISTORY — PX: IR FLUORO GUIDE CV LINE LEFT: IMG2282

## 2018-07-15 HISTORY — PX: IR PTA VENOUS EXCEPT DIALYSIS CIRCUIT: IMG6126

## 2018-07-15 LAB — POCT I-STAT 4, (NA,K, GLUC, HGB,HCT)
Glucose, Bld: 84 mg/dL (ref 70–99)
HCT: 45 % (ref 39.0–52.0)
Hemoglobin: 15.3 g/dL (ref 13.0–17.0)
Potassium: 5.2 mmol/L — ABNORMAL HIGH (ref 3.5–5.1)
Sodium: 137 mmol/L (ref 135–145)

## 2018-07-15 LAB — GLUCOSE, CAPILLARY
GLUCOSE-CAPILLARY: 59 mg/dL — AB (ref 70–99)
GLUCOSE-CAPILLARY: 107 mg/dL — AB (ref 70–99)
Glucose-Capillary: 58 mg/dL — ABNORMAL LOW (ref 70–99)
Glucose-Capillary: 85 mg/dL (ref 70–99)
Glucose-Capillary: 128 mg/dL — ABNORMAL HIGH (ref 70–99)
Glucose-Capillary: 65 mg/dL — ABNORMAL LOW (ref 70–99)
Glucose-Capillary: 55 mg/dL — ABNORMAL LOW (ref 70–99)
Glucose-Capillary: 65 mg/dL — ABNORMAL LOW (ref 70–99)
Glucose-Capillary: 151 mg/dL — ABNORMAL HIGH (ref 70–99)

## 2018-07-15 SURGERY — IRRIGATION AND DEBRIDEMENT EXTREMITY
Anesthesia: Monitor Anesthesia Care | Site: Foot | Laterality: Right

## 2018-07-15 MED ORDER — CHLORHEXIDINE GLUCONATE 4 % EX LIQD
CUTANEOUS | Status: AC
  Filled 2018-07-15: qty 15

## 2018-07-15 MED ORDER — FENTANYL CITRATE (PF) 250 MCG/5ML IJ SOLN
INTRAMUSCULAR | Status: AC
  Filled 2018-07-15: qty 5

## 2018-07-15 MED ORDER — LIDOCAINE HCL 1 % IJ SOLN
INTRAMUSCULAR | Status: AC
  Filled 2018-07-15: qty 20

## 2018-07-15 MED ORDER — IOPAMIDOL (ISOVUE-300) INJECTION 61%
INTRAVENOUS | Status: AC
  Filled 2018-07-15: qty 50

## 2018-07-15 MED ORDER — ONDANSETRON HCL 4 MG/2ML IJ SOLN
INTRAMUSCULAR | Status: AC
  Filled 2018-07-15: qty 2

## 2018-07-15 MED ORDER — VANCOMYCIN HCL 1000 MG IV SOLR
INTRAVENOUS | Status: DC | PRN
  Administered 2018-07-15: 1000 mg

## 2018-07-15 MED ORDER — MIDAZOLAM HCL 2 MG/2ML IJ SOLN
INTRAMUSCULAR | Status: AC | PRN
  Administered 2018-07-15: 1 mg via INTRAVENOUS

## 2018-07-15 MED ORDER — EPHEDRINE SULFATE 50 MG/ML IJ SOLN
INTRAMUSCULAR | Status: DC | PRN
  Administered 2018-07-15: 5 mg via INTRAVENOUS

## 2018-07-15 MED ORDER — SODIUM CHLORIDE 0.9 % IV SOLN
INTRAVENOUS | Status: DC
  Administered 2018-07-15 – 2018-07-23 (×2): via INTRAVENOUS

## 2018-07-15 MED ORDER — PROMETHAZINE HCL 25 MG/ML IJ SOLN: 6.2500 mg | INTRAMUSCULAR | Status: DC | PRN

## 2018-07-15 MED ORDER — DEXTROSE 50 % IV SOLN
12.5000 g | INTRAVENOUS | Status: AC
  Administered 2018-07-15: 12.5 g via INTRAVENOUS

## 2018-07-15 MED ORDER — BUPIVACAINE HCL (PF) 0.5 % IJ SOLN
INTRAMUSCULAR | Status: AC
  Filled 2018-07-15: qty 30

## 2018-07-15 MED ORDER — FENTANYL CITRATE (PF) 100 MCG/2ML IJ SOLN
INTRAMUSCULAR | Status: AC | PRN
  Administered 2018-07-15: 50 ug via INTRAVENOUS

## 2018-07-15 MED ORDER — LIDOCAINE 2% (20 MG/ML) 5 ML SYRINGE
INTRAMUSCULAR | Status: AC
  Filled 2018-07-15: qty 5

## 2018-07-15 MED ORDER — PROPOFOL 500 MG/50ML IV EMUL
INTRAVENOUS | Status: DC | PRN
  Administered 2018-07-15: 50 ug/kg/min via INTRAVENOUS

## 2018-07-15 MED ORDER — MIDAZOLAM HCL 2 MG/2ML IJ SOLN
INTRAMUSCULAR | Status: AC
  Filled 2018-07-15: qty 2

## 2018-07-15 MED ORDER — VANCOMYCIN HCL 1000 MG IV SOLR
INTRAVENOUS | Status: AC
  Filled 2018-07-15: qty 1000

## 2018-07-15 MED ORDER — LIDOCAINE 2% (20 MG/ML) 5 ML SYRINGE
INTRAMUSCULAR | Status: DC | PRN
  Administered 2018-07-15: 40 mg via INTRAVENOUS

## 2018-07-15 MED ORDER — BUPIVACAINE HCL (PF) 0.5 % IJ SOLN
INTRAMUSCULAR | Status: DC | PRN
  Administered 2018-07-15: 10 mL

## 2018-07-15 MED ORDER — FENTANYL CITRATE (PF) 100 MCG/2ML IJ SOLN
INTRAMUSCULAR | Status: AC
  Filled 2018-07-15: qty 2

## 2018-07-15 MED ORDER — DEXTROSE 50 % IV SOLN
INTRAVENOUS | Status: AC
  Filled 2018-07-15: qty 50

## 2018-07-15 MED ORDER — SODIUM CHLORIDE 0.9 % IR SOLN
Status: DC | PRN
  Administered 2018-07-15: 3000 mL

## 2018-07-15 MED ORDER — HYDROMORPHONE HCL 1 MG/ML IJ SOLN: 0.2500 mg | INTRAMUSCULAR | Status: DC | PRN

## 2018-07-15 MED ORDER — TECHNETIUM TC 99M MEDRONATE IV KIT
21.9000 | PACK | Freq: Once | INTRAVENOUS | Status: AC | PRN
  Administered 2018-07-15: 21.9 via INTRAVENOUS

## 2018-07-15 MED ORDER — SODIUM CHLORIDE 0.9 % IV SOLN
INTRAVENOUS | Status: AC | PRN
  Administered 2018-07-15: 10 mL/h via INTRAVENOUS

## 2018-07-15 MED ORDER — DEXAMETHASONE SODIUM PHOSPHATE 10 MG/ML IJ SOLN
INTRAMUSCULAR | Status: AC
  Filled 2018-07-15: qty 1

## 2018-07-15 MED ORDER — PROPOFOL 10 MG/ML IV BOLUS
INTRAVENOUS | Status: AC
  Filled 2018-07-15: qty 20

## 2018-07-15 MED ORDER — 0.9 % SODIUM CHLORIDE (POUR BTL) OPTIME
TOPICAL | Status: DC | PRN
  Administered 2018-07-15: 1000 mL

## 2018-07-15 MED ORDER — PHENYLEPHRINE HCL 10 MG/ML IJ SOLN
INTRAMUSCULAR | Status: DC | PRN
  Administered 2018-07-15: 100 ug via INTRAVENOUS
  Administered 2018-07-15: 80 ug via INTRAVENOUS

## 2018-07-15 MED ORDER — FENTANYL CITRATE (PF) 100 MCG/2ML IJ SOLN
INTRAMUSCULAR | Status: DC | PRN
  Administered 2018-07-15: 50 ug via INTRAVENOUS

## 2018-07-15 MED ORDER — DEXTROSE 50 % IV SOLN
INTRAVENOUS | Status: AC
  Administered 2018-07-15: 25 mL via INTRAVENOUS
  Filled 2018-07-15: qty 50

## 2018-07-15 MED ORDER — SODIUM CHLORIDE 0.9 % IV SOLN
INTRAVENOUS | Status: DC | PRN
  Administered 2018-07-15: 50 ug/min via INTRAVENOUS

## 2018-07-15 MED ORDER — HEPARIN SODIUM (PORCINE) 1000 UNIT/ML IJ SOLN
INTRAMUSCULAR | Status: AC
  Administered 2018-07-15: 4.2 mL
  Filled 2018-07-15: qty 1

## 2018-07-15 MED ORDER — PROPOFOL 10 MG/ML IV BOLUS
INTRAVENOUS | Status: DC | PRN
  Administered 2018-07-15 (×2): 20 mg via INTRAVENOUS

## 2018-07-15 MED ORDER — LIDOCAINE HCL (PF) 1 % IJ SOLN
INTRAMUSCULAR | Status: AC | PRN
  Administered 2018-07-15: 5 mL

## 2018-07-15 SURGICAL SUPPLY — 49 items
BANDAGE ACE 4X5 VEL STRL LF (GAUZE/BANDAGES/DRESSINGS) ×2 IMPLANT
BLADE AVERAGE 25MMX9MM (BLADE) ×1
BLADE AVERAGE 25X9 (BLADE) ×1 IMPLANT
BNDG CMPR 9X4 STRL LF SNTH (GAUZE/BANDAGES/DRESSINGS)
BNDG ESMARK 4X9 LF (GAUZE/BANDAGES/DRESSINGS) IMPLANT
BNDG GAUZE ELAST 4 BULKY (GAUZE/BANDAGES/DRESSINGS) IMPLANT
CHLORAPREP W/TINT 26ML (MISCELLANEOUS) ×3 IMPLANT
COVER SURGICAL LIGHT HANDLE (MISCELLANEOUS) ×3 IMPLANT
COVER WAND RF STERILE (DRAPES) ×3 IMPLANT
CUFF TOURNIQUET SINGLE 18IN (TOURNIQUET CUFF) IMPLANT
CUFF TOURNIQUET SINGLE 34IN LL (TOURNIQUET CUFF) IMPLANT
DRAPE STERI IOBAN 125X83 (DRAPES) ×2 IMPLANT
DRAPE U-SHAPE 47X51 STRL (DRAPES) ×3 IMPLANT
DRESSING VERAFLO CLEANSE CC (GAUZE/BANDAGES/DRESSINGS) IMPLANT
DRSG CLEANSE VERAFLOW (GAUZE/BANDAGES/DRESSINGS) ×2 IMPLANT
DRSG VERAFLO CLEANSE CC (GAUZE/BANDAGES/DRESSINGS) ×3
ELECT CAUTERY BLADE 6.4 (BLADE) ×3 IMPLANT
ELECT REM PT RETURN 9FT ADLT (ELECTROSURGICAL) ×3
ELECTRODE REM PT RTRN 9FT ADLT (ELECTROSURGICAL) ×1 IMPLANT
GAUZE SPONGE 4X4 12PLY STRL (GAUZE/BANDAGES/DRESSINGS) IMPLANT
GLOVE BIO SURGEON STRL SZ7.5 (GLOVE) ×3 IMPLANT
GLOVE BIOGEL PI IND STRL 8 (GLOVE) ×1 IMPLANT
GLOVE BIOGEL PI INDICATOR 8 (GLOVE) ×2
GOWN STRL REUS W/ TWL LRG LVL3 (GOWN DISPOSABLE) ×1 IMPLANT
GOWN STRL REUS W/ TWL XL LVL3 (GOWN DISPOSABLE) ×1 IMPLANT
GOWN STRL REUS W/TWL LRG LVL3 (GOWN DISPOSABLE) ×3
GOWN STRL REUS W/TWL XL LVL3 (GOWN DISPOSABLE) ×3
KIT BASIN OR (CUSTOM PROCEDURE TRAY) ×3 IMPLANT
KIT TURNOVER KIT B (KITS) ×3 IMPLANT
MANIFOLD NEPTUNE II (INSTRUMENTS) ×3 IMPLANT
NDL BIOPSY JAMSHIDI 8X6 (NEEDLE) IMPLANT
NDL HYPO 25GX1X1/2 BEV (NEEDLE) IMPLANT
NEEDLE BIOPSY JAMSHIDI 8X6 (NEEDLE) IMPLANT
NEEDLE HYPO 25GX1X1/2 BEV (NEEDLE) IMPLANT
NS IRRIG 1000ML POUR BTL (IV SOLUTION) ×3 IMPLANT
PACK ORTHO EXTREMITY (CUSTOM PROCEDURE TRAY) ×3 IMPLANT
PAD ARMBOARD 7.5X6 YLW CONV (MISCELLANEOUS) ×6 IMPLANT
PAD CAST 4YDX4 CTTN HI CHSV (CAST SUPPLIES) IMPLANT
PADDING CAST COTTON 4X4 STRL (CAST SUPPLIES) ×3
PROBE DEBRIDE SONICVAC MISONIX (TIP) IMPLANT
SCRUB BETADINE 4OZ XXX (MISCELLANEOUS) ×3 IMPLANT
SET CYSTO W/LG BORE CLAMP LF (SET/KITS/TRAYS/PACK) ×3 IMPLANT
SOL PREP POV-IOD 4OZ 10% (MISCELLANEOUS) ×3 IMPLANT
SYR CONTROL 10ML LL (SYRINGE) IMPLANT
TOWEL GREEN STERILE (TOWEL DISPOSABLE) ×3 IMPLANT
TOWEL GREEN STERILE FF (TOWEL DISPOSABLE) ×3 IMPLANT
TUBE CONNECTING 12'X1/4 (SUCTIONS) ×1
TUBE CONNECTING 12X1/4 (SUCTIONS) ×2 IMPLANT
YANKAUER SUCT BULB TIP NO VENT (SUCTIONS) ×3 IMPLANT

## 2018-07-15 NOTE — Anesthesia Preprocedure Evaluation (Signed)
Anesthesia Evaluation  Patient identified by MRN, date of birth, ID band Patient awake    Reviewed: Allergy & Precautions, NPO status , Patient's Chart, lab work & pertinent test results  History of Anesthesia Complications Negative for: history of anesthetic complications  Airway Mallampati: II  TM Distance: >3 FB Neck ROM: Full    Dental  (+) Upper Dentures, Edentulous Upper, Poor Dentition, Missing, Chipped, Dental Advisory Given   Pulmonary COPD, former smoker,    breath sounds clear to auscultation       Cardiovascular hypertension, (-) angina+ Peripheral Vascular Disease  + pacemaker + Cardiac Defibrillator (last shock a year ago)  Rhythm:Regular Rate:Normal     Neuro/Psych negative neurological ROS     GI/Hepatic Neg liver ROS, GERD  ,  Endo/Other  diabetes  Renal/GU Dialysis and ESRFRenal disease (MWF, K+ 4.3)     Musculoskeletal  (+) Arthritis ,   Abdominal   Peds  Hematology negative hematology ROS (+)   Anesthesia Other Findings   Reproductive/Obstetrics                             Anesthesia Physical  Anesthesia Plan  ASA: III  Anesthesia Plan: MAC   Post-op Pain Management:    Induction:   PONV Risk Score and Plan: 1 and Ondansetron and Treatment may vary due to age or medical condition  Airway Management Planned: Natural Airway and Nasal Cannula  Additional Equipment:   Intra-op Plan:   Post-operative Plan:   Informed Consent: I have reviewed the patients History and Physical, chart, labs and discussed the procedure including the risks, benefits and alternatives for the proposed anesthesia with the patient or authorized representative who has indicated his/her understanding and acceptance.   Dental advisory given  Plan Discussed with: CRNA and Surgeon  Anesthesia Plan Comments:         Anesthesia Quick Evaluation

## 2018-07-15 NOTE — Progress Notes (Signed)
[facility] Progress Note   Subjective:  Seen in room, just getting back from bone scan. Has not had TDC exchange yet. Sched for surgery (toe amputation, bone biopsy) later today as well. Denies CP/dyspnea.  Objective Vitals:   07/15/18 0442 07/15/18 0600 07/15/18 0615 07/15/18 0700  BP: 140/85     Pulse: 87     Resp: 16     Temp: (!) 97.5 F (36.4 C)  97.6 F (36.4 C)   TempSrc: Oral  Oral   SpO2: 100%     Weight:  83 kg    Height:    5\' 8"  (1.727 m)   Physical Exam General: Well appearing man, NAD Heart: RRR; 2/6 systolic murmur Lungs: CTAB Abdomen: soft, non-tender Extremities: Bandaged R foot, no LLE edema Dialysis Access: TDC in L chest --?bandage pulled down, informed RN to replace. On abx. Going for [facility] exchange prior to next HD.  Additional Objective Labs: Basic Metabolic Panel: Recent Labs  Lab 07/13/18 1252 07/14/18 0156  NA 136 139  K 5.1 4.7  CL 97* 97*  CO2 23 24  GLUCOSE 104* 93  BUN 41* 47*  CREATININE 9.49* 9.66*  CALCIUM 9.0 8.9   Liver Function Tests: Recent Labs  Lab 07/13/18 1252  AST 19  ALT 11  ALKPHOS 56  BILITOT 0.8  PROT 8.1  ALBUMIN 3.1*   CBC: Recent Labs  Lab 07/13/18 1252 07/14/18 0156  WBC 7.3 6.7  NEUTROABS 5.7  --   HGB 13.4 12.5*  HCT 43.2 41.5  MCV 108.0* 106.4*  PLT 308 337   Studies/Results: Dg Foot Complete Left  Result Date: 07/13/2018 Please see detailed radiograph report in office note.  Dg Foot Complete Right  Result Date: 07/13/2018 CLINICAL DATA:  Right foot pain after blister popped on right foot above little toe 3 days ago. History of diabetes. EXAM: RIGHT FOOT COMPLETE - 3+ VIEW COMPARISON:  Earlier same day radiographs of the foot. FINDINGS: Soft tissue ulceration is seen along the lateral aspect of the forefoot adjacent to the head of the fifth metatarsal. No radiographic evidence of osteomyelitis. No cortical bone loss deep to the ulceration is identified. Comminuted  intra-articular fracture of the head of the first proximal phalanx extending into the interphalangeal joint is noted. Mild degenerative joint space narrowing of the DIP and PIP joints of the second through fifth digits. Extra-articular erosive change along the medial aspect of the first metatarsal head. Midfoot articulations are maintained with tiny accessory ossicle adjacent to the tarsal navicular. Vascular calcifications crossing the ankle joint are identified compatible with history of diabetes. Small calcaneal enthesophytes are noted. Soft tissue calcifications along the course of the Achilles tendon is identified possibly related to old trauma. IMPRESSION: 1. Soft tissue ulceration along the lateral aspect of the forefoot adjacent to the fifth metatarsal head. No cortical bone loss to suggest radiographic evidence of osteomyelitis. 2. Comminuted intra-articular fracture of the first proximal phalangeal head extending into the interphalangeal joint. No significant callus formation is identified suggesting a recent fracture. 3. Ancillary findings as above. Electronically Signed   By: Ashley Royalty M.D.   On: 07/13/2018 13:37   Dg Foot Complete Right  Result Date: 07/13/2018 Please see detailed radiograph report in office note.  Vas [place] Burnard Bunting With/wo Tbi  Result Date: 07/14/2018 LOWER EXTREMITY DOPPLER STUDY Indications: Ulceration. High Risk Factors: Diabetes. Other Factors: Renal Dialysis Pt with lt fistula.  Performing Technologist: Birdena Crandall, [place] [facility]  Examination Guidelines: A complete evaluation includes at  minimum, Doppler waveform signals and systolic blood pressure reading at the level of bilateral brachial, anterior tibial, and posterior tibial arteries, when vessel segments are accessible. Bilateral testing is considered an integral part of a complete examination. Photoelectric Plethysmograph (PPG) waveforms and toe systolic pressure readings are included as required and additional duplex  testing as needed. Limited examinations for reoccurring indications may be performed as noted.  ABI Findings: +---------+------------------+-----+-------------------+--------+ Right    Rt Pressure (mmHg)IndexWaveform           Comment  +---------+------------------+-----+-------------------+--------+ Brachial 157                    triphasic                   +---------+------------------+-----+-------------------+--------+ PTA      97                0.62 dampened monophasic         +---------+------------------+-----+-------------------+--------+ DP       94                0.60 dampened monophasic         +---------+------------------+-----+-------------------+--------+ Great Toe25                0.16                             +---------+------------------+-----+-------------------+--------+ +---------+------------------+-----+----------+----------------------------+ Left     Lt Pressure (mmHg)IndexWaveform  Comment                      +---------+------------------+-----+----------+----------------------------+ Brachial                                  Resyructed limb wuth fistula +---------+------------------+-----+----------+----------------------------+ PTA      79                0.50 monophasic                             +---------+------------------+-----+----------+----------------------------+ DP       91                0.58 monophasic                             +---------+------------------+-----+----------+----------------------------+ Great Toe255               1.62           Non compressible             +---------+------------------+-----+----------+----------------------------+ +-------+-----------+---------------+------------+------------+ ABI/TBIToday's ABIToday's TBI    Previous ABIPrevious TBI +-------+-----------+---------------+------------+------------+ Right  0.62       0.16                                     +-------+-----------+---------------+------------+------------+ Left   0.58       Noncompressible                         +-------+-----------+---------------+------------+------------+  Summary: Right: Resting right ankle-brachial index indicates moderate right lower extremity arterial disease. The right toe-brachial index is abnormal. Left: Resting left ankle-brachial index indicates moderate left lower extremity arterial disease. TBIs are unreliable.  *See  table(s) above for measurements and observations.  Electronically signed by Quay Burow MD on 07/14/2018 at 4:46:20 PM.    Final    Medications: . cefTRIAXone (ROCEPHIN)  IV Stopped (07/13/18 1905)  . metronidazole 500 mg (07/15/18 0820)  . vancomycin 750 mg (07/14/18 1126)   . aspirin EC  81 mg Oral Daily  . Chlorhexidine Gluconate Cloth  6 each Topical Q0600  . cinacalcet  30 mg Oral Q breakfast  . dextrose      . feeding supplement (NEPRO CARB STEADY)  237 mL Oral Q24H  . feeding supplement (PRO-STAT SUGAR FREE 64)  30 mL Oral BID WC  . ferric citrate  630 mg Oral TID WC  . gabapentin  100 mg Oral QHS  . insulin aspart  0-15 Units Subcutaneous TID WC  . insulin aspart  0-5 Units Subcutaneous QHS  . lidocaine  20 mL Infiltration Once  . midodrine  10 mg Oral Once per day on Mon Wed Fri  . multivitamin  1 tablet Oral QHS    Dialysis Orders: MWF at [facility] 4 hours, BFR 450, DFR 600, EDW 79.5 kg, 3K/2 calcium, heparin 5000 unit bolus, calcitriol 1 mcg p.o. 3 times weekly, left IJ TDC  Assessment/Plan: 1.  Diabetic foot infection with draining dorsal lateral wound: On broad-spectrum antimicrobial coverage with Rocephin, metronidazole and vancomycin. S/p bone scan this morning. For debridement, toe amputation this afternoon. 2.  End-stage renal disease: Continue HD per MWF schedule. TDC running very poorly yesterday, no improvement with tPA. Sched for replacement of his tunneled hemodialysis catheter prior to next  HD. 3.  Hypertension: BP ok today. Uses midodrine for BP support with HD. 4.  Anemia of chronic kidney disease: Hgb above goal at this time, but will watch for post-op drop and start ESA if needed. 5.  Secondary hyperparathyroidism: Continue Auryxia for phosphorus binding, continue Sensipar for PTH suppression. 6.  History of peripheral vascular disease, cardiomyopathy status post AICD 7. Nutrition: Alb low, continue protein supps.  Veneta Penton, PA-C 07/15/2018, 10:10 AM  [employer] Pager: [phone]

## 2018-07-15 NOTE — Op Note (Signed)
Patient Name: Timothy Faulkner DOB: 1946/08/07  MRN: [id number]   Date of Service: 07/15/18    Surgeon: Dr. Hardie Pulley, DPM Assistants: None Pre-operative Diagnosis: Osteomyelitis, Diabetic Foot Infection Right Foot Post-operative Diagnosis: Same Procedures:             1) Incision Bone Cortex for Osteomyelitis  2) Bone Biopsy Distal Right 5th Metatarsal  3) Metatarsectomy   4) Bone Biopsy Proximal Right 5th Metatarsal  5) Application of Wound VAC Pathology/Specimens: ID Type Source Tests Collected by Time Destination  1 : right 5th metatarsal bone biopsy Tissue Bone SURGICAL PATHOLOGY [name], DPM 07/15/2018 1853   2 : bone biopsy right foot for clear margins Tissue Bone SURGICAL PATHOLOGY [name], DPM 07/15/2018 1858   A : METATARSAL BONE FROM RIGHT FOOT Tissue Bone AEROBIC/ANAEROBIC CULTURE (SURGICAL/DEEP WOUND) [name], DPM 07/15/2018 1847   B : SOFT TISSUE RIGHT FOOT Tissue Soft Tissue, Other AEROBIC/ANAEROBIC CULTURE (SURGICAL/DEEP WOUND) [name], DPM 07/15/2018 1850    Anesthesia: MAC/Local Hemostasis: Anatomic Estimated Blood Loss: 10 ml Materials: None Medications: 1g Vancomycin powder Complications: None   Indications for Procedure:  This is a 72 y.o. male with a diabetic foot infection to the right foot. He underwent bedside incision and drainage of the right foot until operative intervention was able to be performed.  Bone scan was performed showing osteomyelitis of the fifth metatarsal.  It was discussed with the patient he would benefit from incision and drainage with debridement of all nonviable tissue possible fifth metatarsal resection and wound VAC therapy.  All risk benefits alternatives were discussed with the patient no guarantees were given.  If discussed the patient that peripheral arterial disease is severely complicating factor in patient's condition and he is at a high risk of further amputation.   Procedure in  Detail: Patient was identified in pre-operative holding area. Formal consent was signed and the right lower extremity was marked. Patient was brought back to the operating room and remained on a stretcher in the supine position. Anesthesia was induced.   The extremity was prepped and draped in the usual sterile fashion. Timeout was taken to confirm patient name, laterality, and procedure prior to incision. Attention was then directed to the the right foot where a large necrotic wound measuring 2.5 cm x 2 cm x 0.5 cm was noted.  Bone Biopsy Distal 5th Metatarsal Attention was then directed to the fifth metatarsal area.  There was gross necrosis surrounding the metatarsal which was thoroughly debrided with a rongeur. A rongeur was used to biopsy the prominent gray appearing part of the bone to confirm osteomyelitis.   Incision Bone Cortex for Osteomyelitis An incision was then made overlying the periosteum of the fifth metatarsal and the periosteum was gently freed from the bone.  The metatarsal was inspected distally the metatarsal bone was gray in appearance.  The bone was incised with 15 blade without viable bleeding noted.  Attention was then directed to the fifth toe area.  The tendon going to the fifth toe appeared necrotic and was sharply debrided excisionally with a rondure.  The periosteum was then inspected. The periosteum over the proximal phalanx was incised and the bone did appear healthy and viable without evidence of necrosis. The wound was then thoroughly lavaged with approximately 2L of normal saline via pulse lavage.  Metatarsectomy The metatarsal was then inspected post lavage.  Distally the bone looked necrotic approximately the metatarsal did appear viable.  The bone was  transected with a sagittal proximally to the point of good bleeding viable bone. The bone was freed of all soft tissue attachments and removed from the wound.  The remaining bone was again thoroughly lavaged with  approximately 1 L of normal saline via pulse lavage.  Bone Biopsy Proximal 5th Metatarsal An additional proximal piece of the bone was collected with a rongeur to evaluate for a clean margin.  The bone was then rasped smooth.    Wound VAC Application At this point there was a large but clean appearing wound that was not amenable to closure.  It was felt that the wound had to be secondarily closed at a later date.  A [employer] vera flow installation VAC was then applied with a honeycomb sponge applied to the wound bed followed by VAC sponge.  This was adhered with the occlusive dressing and set to suction with good seal noted. Fluid was connected to the tubing and the vac instilled with 20cc of fluid with retention of seal noted.  VAC was then set to 125 mmHg continuous pressure with 10-minute lavage cycles to continue to cleanse the wound.  The foot was then dressed with cast padding and ACE bandage. Patient tolerated the procedure well.   Disposition: Following a period of post-operative monitoring, patient will be transferred back to the floor.  Less than expected bleeding was encountered during the procedure concerning for possible delayed wound healing.  I do believe he would benefit from a vascular evaluation to see if he needs more circulation to the foot.  ABIs were suggestive of moderate arterial disease.  He will likely have to return to the operating room at a later date for cleansing of the wound and possible wound closure.  I believe that the function of the fifth toe was compromised as the metatarsal is no longer there and the tendons to the toe had to be excised.  I do think that filleted toe flap would provide much-needed skin coverage he would likely benefit from filleted toe flap to assist in closure of the wound as the toe will likely be nonfunctional.

## 2018-07-15 NOTE — Interval H&P Note (Signed)
History and Physical Interval Note:  07/15/2018 5:21 PM  Timothy Faulkner  has presented today for surgery, with the diagnosis of infected right foot  The various methods of treatment have been discussed with the patient and family. After consideration of risks, benefits and other options for treatment, the patient has consented to  Procedure(s): IRRIGATION AND DEBRIDEMENT RIGHT FOOT (Right) METATARSAL RESECTION (Right) as a surgical intervention .  The patient's history has been reviewed, patient examined, no change in status, stable for surgery.  I have reviewed the patient's chart and labs.  Questions were answered to the patient's satisfaction.     [name]

## 2018-07-15 NOTE — Anesthesia Procedure Notes (Signed)
Procedure Name: MAC Date/Time: 07/15/2018 5:43 PM Performed by: Lance Coon, CRNA Pre-anesthesia Checklist: Patient identified, Emergency Drugs available, Suction available and Patient being monitored Patient Re-evaluated:Patient Re-evaluated prior to induction Oxygen Delivery Method: Simple face mask

## 2018-07-15 NOTE — Progress Notes (Signed)
Pt awake,alert, no c/o, CBG 55. He is drinking reg Sprite & eating PB crackers. Dr Smith Robert updated.  No new orders-will recheck after snack.

## 2018-07-15 NOTE — Progress Notes (Signed)
Blood sugar 85.

## 2018-07-15 NOTE — Progress Notes (Signed)
PROGRESS NOTE    Timothy Faulkner  MPN:[id number] DOB: Jul 04, 1946 DOA: 07/13/2018 PCP: [name], MD    Brief Narrative:  Timothy Faulkner is a 72 y.o. male with medical history significant of DM; diabetic foot ulcer/PAD; HTN; HLD; ESRD on MWF HD; and CHF with AICD presenting with foot pain.  He started with an ulcer that opened on Saturday, although he had a sore there for a few weeks. He was admitted for possible OM of the right foot.   Assessment & Plan:   Principal Problem:   Diabetic foot infection ([place]) Active Problems:   Essential hypertension   Automatic implantable cardioverter-defibrillator in situ   ESRD (end stage renal disease) on dialysis (HCC)   Cardiomyopathy, dilated, nonischemic (HCC)   Peripheral arterial disease (HCC)   Osteomyelitis of right foot (HCC)   Vascular calcification   Cellulitis and abscess of foot, except toes   Osteomyelitis of the right great toe and surrounding cellulitis of the right foot. :  Admitted for IV antibiotics and Dr March Rummage with Podiatry on board and planning for I&d today with amputation of the great toe, with bone biopsy and possible wound vac placement.  Continue with IV antibiotics , pain control.  ABI's done, they reveal  right: Resting right ankle-brachial index indicates moderate right lower extremity arterial disease. The right toe-brachial index is abnormal.  Left: Resting left ankle-brachial index indicates moderate left lower extremity arterial disease. TBIs are unreliable. Bone scan ordered and pending.     ESRD on HD.  Further management as per nephrology.  Pending replacement of TDC by IR.   Hypertension:  Well controlled.    NICM:  Pt currently denies any chest pain or sob.    Mild anemia of chronic disease.  Need to monitor hemoglobin.   DVT prophylaxis: heparin.  Code Status: full code.  Family Communication: none at bedside.  Disposition Plan: Pending surgery and replacement of TDC by  IR   Consultants:   Podiatry.  Nephrology    Procedures: none.    Antimicrobials: vancomycin , flagyl and rocephin since admission.    Subjective: No new complaints at this time  Objective: Vitals:   07/15/18 0442 07/15/18 0600 07/15/18 0615 07/15/18 0700  BP: 140/85     Pulse: 87     Resp: 16     Temp: (!) 97.5 F (36.4 C)  97.6 F (36.4 C)   TempSrc: Oral  Oral   SpO2: 100%     Weight:  83 kg    Height:    5\' 8"  (1.727 m)    Intake/Output Summary (Last 24 hours) at 07/15/2018 1503 Last data filed at 07/15/2018 0600 Gross per 24 hour  Intake 842.71 ml  Output -  Net 842.71 ml   Filed Weights   07/14/18 0812 07/14/18 1337 07/15/18 0600  Weight: 81 kg 79.8 kg 83 kg    Examination:  General exam: mild discomfort from the foot pain.  Respiratory system: Clear to auscultation. Respiratory effort normal.  No wheezing or rhonchi Cardiovascular system: S1 & S2 heard, RRR. No JVD,  Gastrointestinal system: Abdomen is soft, nontender, nondistended with good bowel sounds Central nervous system: Alert and oriented. No focal neurological deficits. Extremities:  Plantar right foot ulcer with pus drainage with surrounding cellulitis.  Skin: see above.  Psychiatry: Mood & affect appropriate.     Data Reviewed: I have personally reviewed following labs and imaging studies  CBC: Recent Labs  Lab 07/13/18 1252 07/14/18 0156  WBC  7.3 6.7  NEUTROABS 5.7  --   HGB 13.4 12.5*  HCT 43.2 41.5  MCV 108.0* 106.4*  PLT 308 485   Basic Metabolic Panel: Recent Labs  Lab 07/13/18 1252 07/14/18 0156  NA 136 139  K 5.1 4.7  CL 97* 97*  CO2 23 24  GLUCOSE 104* 93  BUN 41* 47*  CREATININE 9.49* 9.66*  CALCIUM 9.0 8.9   GFR: Estimated Creatinine Clearance: 7.3 mL/min (A) (by C-G formula based on SCr of 9.66 mg/dL (H)). Liver Function Tests: Recent Labs  Lab 07/13/18 1252  AST 19  ALT 11  ALKPHOS 56  BILITOT 0.8  PROT 8.1  ALBUMIN 3.1*   No results for  input(s): LIPASE, AMYLASE in the last 168 hours. No results for input(s): AMMONIA in the last 168 hours. Coagulation Profile: No results for input(s): INR, PROTIME in the last 168 hours. Cardiac Enzymes: No results for input(s): CKTOTAL, CKMB, CKMBINDEX, TROPONINI in the last 168 hours. BNP (last 3 results) No results for input(s): PROBNP in the last 8760 hours. HbA1C: Recent Labs    07/13/18 1252  HGBA1C 4.7*   CBG: Recent Labs  Lab 07/15/18 0540 07/15/18 0755 07/15/18 0826 07/15/18 1234 07/15/18 1302  GLUCAP 85 59* 128* 65* 107*   Lipid Profile: No results for input(s): CHOL, HDL, LDLCALC, TRIG, CHOLHDL, LDLDIRECT in the last 72 hours. Thyroid Function Tests: No results for input(s): TSH, T4TOTAL, FREET4, T3FREE, THYROIDAB in the last 72 hours. Anemia Panel: No results for input(s): VITAMINB12, FOLATE, FERRITIN, TIBC, IRON, RETICCTPCT in the last 72 hours. Sepsis Labs: Recent Labs  Lab 07/13/18 1331 07/13/18 1503 07/13/18 1819  LATICACIDVEN 2.75* 2.33* 2.58*    Recent Results (from the past 240 hour(s))  Blood culture (routine x 2)     Status: None (Preliminary result)   Collection Time: 07/13/18 12:52 PM  Result Value Ref Range Status   Specimen Description BLOOD RIGHT HAND  Final   Special Requests   Final    BOTTLES DRAWN AEROBIC ONLY Blood Culture results may not be optimal due to an inadequate volume of blood received in culture bottles   Culture   Final    NO GROWTH 2 DAYS Performed at [facility] [address], [place], [place] [address]    Report Status PENDING  Incomplete  Blood culture (routine x 2)     Status: None (Preliminary result)   Collection Time: 07/13/18 12:57 PM  Result Value Ref Range Status   Specimen Description BLOOD BLOOD RIGHT FOREARM  Final   Special Requests   Final    BOTTLES DRAWN AEROBIC AND ANAEROBIC Blood Culture results may not be optimal due to an inadequate volume of blood received in culture bottles   Culture    Final    NO GROWTH 2 DAYS Performed at [facility], [place] [address], [place], [place] [address]    Report Status PENDING  Incomplete  WOUND CULTURE     Status: None (Preliminary result)   Collection Time: 07/13/18  1:24 PM  Result Value Ref Range Status   MICRO NUMBER: [id number]  Preliminary   SPECIMEN QUALITY: Adequate  Preliminary   SOURCE: NOT GIVEN  Preliminary   STATUS: PRELIMINARY  Preliminary   GRAM STAIN:   Preliminary    No white blood cells seen No epithelial cells seen Many Gram positive cocci in pairs Many Gram negative bacilli   RESULT: Culture in progress  Preliminary  Surgical pcr screen  Status: None   Collection Time: 07/13/18  9:11 PM  Result Value Ref Range Status   MRSA, PCR NEGATIVE NEGATIVE Final   Staphylococcus aureus NEGATIVE NEGATIVE Final    Comment: (NOTE) The Xpert SA Assay (FDA approved for NASAL specimens in patients 14 years of age and older), is one component of a comprehensive surveillance program. It is not intended to diagnose infection nor to guide or monitor treatment. Performed at [facility], [place] [address], [place], [place] [address]          Radiology Studies: Nm Bone Scan 3 Phase Lower Extremity  Result Date: 07/15/2018 CLINICAL DATA:  Soft tissue ulceration adjacent to the fifth metatarsal. Osteomyelitis suggested on radiograph. EXAM: NUCLEAR MEDICINE 3-PHASE BONE SCAN TECHNIQUE: Radionuclide angiographic images, immediate static blood pool images, and 3-hour delayed static images were obtained of the feet after intravenous injection of radiopharmaceutical. RADIOPHARMACEUTICALS:  21.9 mCi Tc-88m MDP IV COMPARISON:  Radiograph 07/13/2018 FINDINGS: Vascular phase: Mild diffuse hyperemia to the RIGHT foot compared to the LEFT. Blood pool phase: Diffuse increased blood pool activity in the RIGHT foot compared to the LEFT. Some focality in the great toe on the RIGHT. Focality also the head of the fifth metacarpal Delayed  phase: Focal activity in the proximal phalanx of the first digit and distal fifth metacarpal. IMPRESSION: Focal delayed activity in the proximal phalanx of the first digit and distal aspect of the fifth metatarsal of the RIGHT foot. With comparison to radiograph 07/13/2018, the proximal phalanx first digit uptake is favored posttraumatic. Uptake at the distal aspect of the fifth metatarsal is favored osteomyelitis. Electronically Signed   By: Suzy Bouchard M.D.   On: 07/15/2018 12:44   Vas [place] Abi With/wo Tbi  Result Date: 07/14/2018 LOWER EXTREMITY DOPPLER STUDY Indications: Ulceration. High Risk Factors: Diabetes. Other Factors: Renal Dialysis Pt with lt fistula.  Performing Technologist: Birdena Crandall, [place] [facility]  Examination Guidelines: A complete evaluation includes at minimum, Doppler waveform signals and systolic blood pressure reading at the level of bilateral brachial, anterior tibial, and posterior tibial arteries, when vessel segments are accessible. Bilateral testing is considered an integral part of a complete examination. Photoelectric Plethysmograph (PPG) waveforms and toe systolic pressure readings are included as required and additional duplex testing as needed. Limited examinations for reoccurring indications may be performed as noted.  ABI Findings: +---------+------------------+-----+-------------------+--------+ Right    Rt Pressure (mmHg)IndexWaveform           Comment  +---------+------------------+-----+-------------------+--------+ Brachial 157                    triphasic                   +---------+------------------+-----+-------------------+--------+ PTA      97                0.62 dampened monophasic         +---------+------------------+-----+-------------------+--------+ DP       94                0.60 dampened monophasic         +---------+------------------+-----+-------------------+--------+ Great Toe25                0.16                              +---------+------------------+-----+-------------------+--------+ +---------+------------------+-----+----------+----------------------------+ Left     Lt Pressure (mmHg)IndexWaveform  Comment                      +---------+------------------+-----+----------+----------------------------+  Brachial                                  Resyructed limb wuth fistula +---------+------------------+-----+----------+----------------------------+ PTA      79                0.50 monophasic                             +---------+------------------+-----+----------+----------------------------+ DP       91                0.58 monophasic                             +---------+------------------+-----+----------+----------------------------+ Great Toe255               1.62           Non compressible             +---------+------------------+-----+----------+----------------------------+ +-------+-----------+---------------+------------+------------+ ABI/TBIToday's ABIToday's TBI    Previous ABIPrevious TBI +-------+-----------+---------------+------------+------------+ Right  0.62       0.16                                    +-------+-----------+---------------+------------+------------+ Left   0.58       Noncompressible                         +-------+-----------+---------------+------------+------------+  Summary: Right: Resting right ankle-brachial index indicates moderate right lower extremity arterial disease. The right toe-brachial index is abnormal. Left: Resting left ankle-brachial index indicates moderate left lower extremity arterial disease. TBIs are unreliable.  *See table(s) above for measurements and observations.  Electronically signed by Quay Burow MD on 07/14/2018 at 4:46:20 PM.    Final         Scheduled Meds: . aspirin EC  81 mg Oral Daily  . Chlorhexidine Gluconate Cloth  6 each Topical Q0600  . cinacalcet  30 mg Oral Q breakfast  . feeding  supplement (NEPRO CARB STEADY)  237 mL Oral Q24H  . feeding supplement (PRO-STAT SUGAR FREE 64)  30 mL Oral BID WC  . ferric citrate  630 mg Oral TID WC  . gabapentin  100 mg Oral QHS  . insulin aspart  0-15 Units Subcutaneous TID WC  . insulin aspart  0-5 Units Subcutaneous QHS  . lidocaine  20 mL Infiltration Once  . midodrine  10 mg Oral Once per day on Mon Wed Fri  . multivitamin  1 tablet Oral QHS   Continuous Infusions: . cefTRIAXone (ROCEPHIN)  IV Stopped (07/13/18 1905)  . metronidazole 500 mg (07/15/18 0820)  . vancomycin 750 mg (07/14/18 1126)     LOS: 2 days    Time spent: 30 minutes.     Hosie Poisson, MD [facility] Pager [phone]   If 7PM-7AM, please contact night-coverage [email or web address] Password [email or web address] 07/15/2018, 3:03 PM

## 2018-07-15 NOTE — Brief Op Note (Signed)
07/15/2018  6:48 PM  PATIENT:  Nelva Nay  72 y.o. male  PRE-OPERATIVE DIAGNOSIS:  infected right foot  POST-OPERATIVE DIAGNOSIS:  infected right foot  PROCEDURE:  Procedure(s): IRRIGATION AND DEBRIDEMENT RIGHT FOOT (Right) METATARSAL RESECTION (Right)  SURGEON:  Surgeon(s) and Role:    [name], DPM - Primary  PHYSICIAN ASSISTANT:   ASSISTANTS: none   ANESTHESIA:   local and MAC  EBL: 10 ml   BLOOD ADMINISTERED:none  DRAINS: Wound VAC   LOCAL MEDICATIONS USED:  MARCAINE    and Amount: 10 ml  SPECIMEN:   1) Bone Biopsy Right 5th Metatarsal 2) Clean Margin Bone Biopsy Right 5th Metatarsal 3) Right 5th Metatarsal bone - Micro 4) Right Foot Deep Soft Tissue - Micro  DISPOSITION OF SPECIMEN:  Micro and path  COUNTS:  YES  TOURNIQUET:  * No tourniquets in log *  DICTATION: .Note written in EPIC  PLAN OF CARE: transfer to floor  PATIENT DISPOSITION:  PACU - hemodynamically stable.   Delay start of Pharmacological VTE agent (>24hrs) due to surgical blood loss or risk of bleeding: not applicable

## 2018-07-15 NOTE — Anesthesia Postprocedure Evaluation (Signed)
Anesthesia Post Note  Patient: Timothy Faulkner  Procedure(s) Performed: IRRIGATION AND DEBRIDEMENT RIGHT FOOT (Right Foot) METATARSAL RESECTION (Right Foot)     Patient location during evaluation: PACU Anesthesia Type: MAC Level of consciousness: awake and alert Pain management: pain level controlled Vital Signs Assessment: post-procedure vital signs reviewed and stable Respiratory status: spontaneous breathing, nonlabored ventilation, respiratory function stable and patient connected to nasal cannula oxygen Cardiovascular status: stable and blood pressure returned to baseline Postop Assessment: no apparent nausea or vomiting Anesthetic complications: no    Last Vitals:  Vitals:   07/15/18 1937 07/15/18 1956  BP:  97/85  Pulse:  (!) 105  Resp: 20 16  Temp: 36.7 C (!) 36.3 C  SpO2: 95%     Last Pain:  Vitals:   07/15/18 2152  TempSrc:   PainSc: 0-No pain                 Effie Berkshire

## 2018-07-15 NOTE — Progress Notes (Signed)
Pt blood sugar 58. 27ml dextrose given IV

## 2018-07-15 NOTE — Progress Notes (Signed)
Hypoglycemic Event  CBG: 65  Treatment: 12.5g IV dextrose  Symptoms: N/A  Follow-up CBG: Time:1:02 CBG Result:107  Possible Reasons for Event: NPO for surgery  Comments/MD notified: protocol done    Debbora Dus

## 2018-07-15 NOTE — Progress Notes (Signed)
Hypoglycemic Event  CBG: 59  Treatment: Dextrose 50%, 12.5g  Symptoms: N/A  Follow-up CBG: Time:8:24 CBG Result:128  Possible Reasons for Event: NPO for surgery  Comments/MD notified: Gerome Sam

## 2018-07-15 NOTE — Procedures (Signed)
  Procedure: Exchange L IJ HD tunneled cath, 55mm PTA L innom/SVC stenosis   EBL:   minimal Complications:  none immediate  See full dictation in [employer].  Dillard Cannon MD Main # [phone] Pager  [phone]

## 2018-07-15 NOTE — Progress Notes (Signed)
CBG 65 on recheck. Pt refuses anything else to eat or drink at this time-wants to return to his roo. Dr Smith Robert updated-OK to tx pt back to 5N

## 2018-07-15 NOTE — Transfer of Care (Signed)
Immediate Anesthesia Transfer of Care Note  Patient: Timothy Faulkner  Procedure(s) Performed: IRRIGATION AND DEBRIDEMENT RIGHT FOOT (Right Foot) METATARSAL RESECTION (Right Foot)  Patient Location: PACU  Anesthesia Type:MAC  Level of Consciousness: awake, alert , oriented and patient cooperative  Airway & Oxygen Therapy: Patient Spontanous Breathing and Patient connected to face mask oxygen  Post-op Assessment: Report given to RN and Post -op Vital signs reviewed and stable  Post vital signs: Reviewed and stable  Last Vitals:  Vitals Value Taken Time  BP 100/82 07/15/2018  6:50 PM  Temp    Pulse 96 07/15/2018  6:51 PM  Resp 13 07/15/2018  6:51 PM  SpO2 96 % 07/15/2018  6:51 PM  Vitals shown include unvalidated device data.  Last Pain:  Vitals:   07/15/18 0742  TempSrc:   PainSc: Asleep         Complications: No apparent anesthesia complications

## 2018-07-16 ENCOUNTER — Encounter (HOSPITAL_COMMUNITY): Payer: Self-pay | Admitting: Podiatry

## 2018-07-16 LAB — RENAL FUNCTION PANEL
Albumin: 2.6 g/dL — ABNORMAL LOW (ref 3.5–5.0)
Anion gap: 18 — ABNORMAL HIGH (ref 5–15)
BUN: 60 mg/dL — ABNORMAL HIGH (ref 8–23)
CO2: 21 mmol/L — ABNORMAL LOW (ref 22–32)
Calcium: 8.3 mg/dL — ABNORMAL LOW (ref 8.9–10.3)
Chloride: 97 mmol/L — ABNORMAL LOW (ref 98–111)
Creatinine, Ser: 11.74 mg/dL — ABNORMAL HIGH (ref 0.61–1.24)
GFR calc Af Amer: 4 mL/min — ABNORMAL LOW (ref >60)
GFR calc non Af Amer: 4 mL/min — ABNORMAL LOW (ref >60)
Glucose, Bld: 144 mg/dL — ABNORMAL HIGH (ref 70–99)
POTASSIUM: 4.7 mmol/L (ref 3.5–5.1)
Phosphorus: 7.2 mg/dL — ABNORMAL HIGH (ref 2.5–4.6)
Sodium: 136 mmol/L (ref 135–145)

## 2018-07-16 LAB — CBC
HCT: 38.8 % — ABNORMAL LOW (ref 39.0–52.0)
Hemoglobin: 11.8 g/dL — ABNORMAL LOW (ref 13.0–17.0)
MCH: 32.2 pg (ref 26.0–34.0)
MCHC: 30.4 g/dL (ref 30.0–36.0)
MCV: 106 fL — ABNORMAL HIGH (ref 80.0–100.0)
Platelets: 224 10*3/uL (ref 150–400)
RBC: 3.66 MIL/uL — ABNORMAL LOW (ref 4.22–5.81)
RDW: 16 % — ABNORMAL HIGH (ref 11.5–15.5)
WBC: 9.8 10*3/uL (ref 4.0–10.5)
nRBC: 0 % (ref 0.0–0.2)

## 2018-07-16 LAB — GLUCOSE, CAPILLARY
GLUCOSE-CAPILLARY: 121 mg/dL — AB (ref 70–99)
Glucose-Capillary: 102 mg/dL — ABNORMAL HIGH (ref 70–99)
Glucose-Capillary: 132 mg/dL — ABNORMAL HIGH (ref 70–99)

## 2018-07-16 MED ORDER — HEPARIN SODIUM (PORCINE) 1000 UNIT/ML IJ SOLN
INTRAMUSCULAR | Status: AC
  Administered 2018-07-16: 4200 [IU] via INTRAVENOUS
  Filled 2018-07-16: qty 5

## 2018-07-16 MED ORDER — MIDODRINE HCL 5 MG PO TABS
ORAL_TABLET | ORAL | Status: AC
  Administered 2018-07-16: 10 mg via ORAL
  Filled 2018-07-16: qty 2

## 2018-07-16 MED ORDER — OXYCODONE-ACETAMINOPHEN 5-325 MG PO TABS
ORAL_TABLET | ORAL | Status: AC
  Filled 2018-07-16: qty 1

## 2018-07-16 MED ORDER — HEPARIN SODIUM (PORCINE) 1000 UNIT/ML IJ SOLN
1000.0000 [IU] | INTRAMUSCULAR | Status: DC
  Administered 2018-07-16 – 2018-07-23 (×3): 4200 [IU] via INTRAVENOUS
  Filled 2018-07-16 (×3): qty 1

## 2018-07-16 NOTE — Progress Notes (Signed)
Pharmacy Antibiotic Note  Timothy Faulkner is a 72 y.o. male admitted on 07/13/2018 with DFI. \  Xray shows soft tissue ulceration. No evidence to suggest osteo. ESRD. Afebrile, WBC wnl.  Plan: Vancomycin 750mg  IV QHD MWF Continue ceftriaxone 1g IV Q24h Continue Flagyl 500mg  IV Q8h Monitor clinical picture, HD sessions, VT prn F/U C&S, abx deescalation / LOT  Height: 5\' 8"  (172.7 cm) Weight: 182 lb 15.7 oz (83 kg) IBW/kg (Calculated) : 68.4  Temp (24hrs), Avg:98.1 F (36.7 C), Min:97.3 F (36.3 C), Max:98.9 F (37.2 C)  Recent Labs  Lab 07/13/18 1252 07/13/18 1331 07/13/18 1503 07/13/18 1819 07/14/18 0156  WBC 7.3  --   --   --  6.7  CREATININE 9.49*  --   --   --  9.66*  LATICACIDVEN  --  2.75* 2.33* 2.58*  --     Estimated Creatinine Clearance: 7.3 mL/min (A) (by C-G formula based on SCr of 9.66 mg/dL (H)).    Allergies  Allergen Reactions  . No Known Allergies    Levester Fresh, PharmD, BCPS, BCCCP Clinical Pharmacist [phone]  Please check AMION for all [place] numbers  07/16/2018 1:23 PM

## 2018-07-16 NOTE — Consult Note (Addendum)
Hospital Consult    Reason for Consult:  Abnormal ABIs; evaluation of L arm AV fistula Requesting Physician:  Dr. Karleen Hampshire MRN #:  [id number]  History of Present Illness: This is a 72 y.o. male with past medical history significant for type 2 diabetes mellitus, PAD, and end-stage renal disease on hemodialysis.  He was seen in consultation due to a finding of abnormal ABIs in the setting of right foot osteomyelitis.  He underwent amputation of fifth right metatarsal by podiatrist [name] yesterday.  ABIs on 07/14/2018 right was 0.6.  There are tentative plans for patient to return to the operating room for further debridement and possible closure by [name] tomorrow 07/17/2018.  He denies any history of claudication or rest pain in bilateral lower extremities.  He was unaware of any wound however says he believes there was a blister that ruptured this past Saturday.  He is taking Plavix per cardiology.  Left arm dialysis access also evaluated.  He has history of a failed left radiocephalic fistula as well as a left brachiocephalic fistula created by Dr. Carlis Abbott on 04/22/2018.  Left IJ TDC was exchanged by IR yesterday.  Patient states they have not yet attempted to cannulize left arm brachiocephalic fistula.  Past Medical History:  Diagnosis Date  . Anemia   . Arthritis    Gout  . Automatic implantable cardioverter-defibrillator in situ    [employer]  . CHF   . ESRD on dialysis [facility])    [name] 03/11/2013 (03/11/2013) dialysis M/W/F  . GERD (gastroesophageal reflux disease)   . Gout    "once a year"  . Heart murmur   . HYPERCHOLESTEROLEMIA, MIXED   . HYPERTENSION   . Macular degeneration   . 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 Pacemaker   . Peripheral arterial disease (HCC)    left fifth toe ulcer, healing  . Pneumonia   . Shortness of breath   . 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)    left fifth toe    Past Surgical History:  Procedure  Laterality Date  . AV FISTULA PLACEMENT Right 12/13/2012   Procedure: ARTERIOVENOUS (AV) FISTULA CREATION;  Surgeon: [name], MD;  Location: [place];  Service: Vascular;  Laterality: Right;  Ultrasound guided  . AV FISTULA PLACEMENT Left 05/07/2016   Procedure: LEFT RADIOCEPHALIC ARTERIOVENOUS (AV) FISTULA CREATION;  Surgeon: [name], MD;  Location: [facility];  Service: Vascular;  Laterality: Left;  . BASCILIC VEIN TRANSPOSITION Right 03/26/2016   Procedure: RIGHT BASILIC VEIN TRANSPOSITION;  Surgeon: [name], MD;  Location: [place];  Service: Vascular;  Laterality: Right;  . CARDIAC CATHETERIZATION    . CARDIAC DEFIBRILLATOR PLACEMENT     Chemical engineer  . EYE SURGERY Bilateral    Cataract  . FISTULOGRAM Left 04/22/2018   Procedure: FISTULOGRAM UPPER EXTREMITY;  Surgeon: [name], MD;  Location: [place];  Service: Vascular;  Laterality: Left;  . I&D EXTREMITY Right 07/15/2018   Procedure: IRRIGATION AND DEBRIDEMENT RIGHT FOOT;  Surgeon: [name], DPM;  Location: [place];  Service: Podiatry;  Laterality: Right;  . INCISION AND DRAINAGE ABSCESS / HEMATOMA OF BURSA / KNEE / THIGH Left 2012   "knee" (03/11/2013)  . INSERT / REPLACE / REMOVE PACEMAKER    . INSERTION OF DIALYSIS CATHETER Left 04/22/2018   Procedure: INSERTION OF DIALYSIS CATHETER;  Surgeon: [name], MD;  Location: [place];  Service: Vascular;  Laterality: Left;  . IR  FLUORO GUIDE CV LINE LEFT  07/15/2018  . IR PTA VENOUS EXCEPT DIALYSIS CIRCUIT  07/15/2018  . IR US GUIDE VASC ACCESS LEFT  07/15/2018  . METATARSAL HEAD EXCISION Right 07/15/2018   Procedure: METATARSAL RESECTION;  Surgeon: [name], DPM;  Location: [place];  Service: Podiatry;  Laterality: Right;  . REVISON OF ARTERIOVENOUS FISTULA Right 7/32/2025   Procedure: Plication OF Right Arm ARTERIOVENOUS FISTULA;  Surgeon: Elam Dutch, MD;  Location: [facility] OR;  Service: Vascular;  Laterality: Right;  . REVISON OF ARTERIOVENOUS FISTULA  Left 04/22/2018   Procedure: REVISION OF RADIOCEPHALIC ARTERIOVENOUS FISTULA;  Surgeon: [name], MD;  Location: [place];  Service: Vascular;  Laterality: Left;  . SHUNTOGRAM N/A 05/31/2013   Procedure: Earney Mallet;  Surgeon: [name], MD;  Location: [facility] CATH LAB;  Service: Cardiovascular;  Laterality: N/A;    Allergies  Allergen Reactions  . No Known Allergies     Prior to Admission medications   Medication Sig Start Date End Date Taking? Authorizing Provider  aspirin EC 81 MG tablet Take 81 mg by mouth daily.   Yes [provider]  AURYXIA 1 GM 210 MG(Fe) tablet Take 630 mg by mouth 3 (three) times daily with meals.  11/12/16  Yes [provider]  clopidogrel (PLAVIX) 75 MG tablet Take 75 mg by mouth daily.   Yes [provider]  colchicine 0.6 MG tablet Take 0.6 mg by mouth daily as needed (for gout flare ups).  02/26/18  Yes [provider]  gabapentin (NEURONTIN) 100 MG capsule TAKE 1 CAPSULE (100 MG TOTAL) BY MOUTH AT BEDTIME. 07/12/18  Yes Cameron Sprang, MD  lactulose, encephalopathy, (CHRONULAC) 10 GM/15ML SOLN Take 20 g by mouth daily as needed (constipation).  05/26/18  Yes [provider]  lidocaine-prilocaine (EMLA) cream Apply 1 application topically See admin instructions. Apply small amount to access site 1 to 2 hours before dialysis. 04/12/18  Yes [provider]  midodrine (PROAMATINE) 10 MG tablet Take 10 mg by mouth 3 (three) times a week.  04/26/18  Yes [provider]  oxyCODONE-acetaminophen (PERCOCET) 5-325 MG tablet Take 1 tablet by mouth every 6 (six) hours as needed for severe pain. 04/22/18  Yes Rhyne, Samantha J, PA-C  SENSIPAR 30 MG tablet Take 30 mg by mouth daily. 04/21/16  Yes [provider]    Social History   Socioeconomic History  . Marital status: Married    Spouse name: [name]  . Number of children: 2  . Years of education: 60  . Highest education level: Not on file    Occupational History  . Occupation: diasbled  Social Needs  . Financial resource strain: Not on file  . Food insecurity:    Worry: Not on file    Inability: Not on file  . Transportation needs:    Medical: Not on file    Non-medical: Not on file  Tobacco Use  . Smoking status: Former Smoker    Packs/day: 0.75    Years: 30.00    Pack years: 22.50    Types: Cigarettes    Last attempt to quit: 11/25/1992    Years since quitting: 25.6  . Smokeless tobacco: Never Used  Substance and Sexual Activity  . Alcohol use: Not Currently  . Drug use: Not Currently    Types: Marijuana  . Sexual activity: Yes  Lifestyle  . Physical activity:    Days per week: Not on file    Minutes per session:  Not on file  . Stress: Not on file  Relationships  . Social connections:    Talks on phone: Not on file    Gets together: Not on file    Attends religious service: Not on file    Active member of club or organization: Not on file    Attends meetings of clubs or organizations: Not on file    Relationship status: Not on file  . Intimate partner violence:    Fear of current or ex partner: Not on file    Emotionally abused: Not on file    Physically abused: Not on file    Forced sexual activity: Not on file  Other Topics Concern  . 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 Heart disease Father   . CAD Father     ROS: Otherwise negative unless mentioned in HPI  Physical Examination  Vitals:   07/16/18 0400 07/16/18 0956  BP: (!) 96/53 110/62  Pulse:  95  Resp:  20  Temp:  98.6 F (37 C)  SpO2:  100%   Body mass index is 27.82 kg/m.  General:  WDWN in NAD Gait: Not observed HENT: WNL, normocephalic Pulmonary: normal non-labored breathing, without Rales, rhonchi,  wheezing Cardiac:  regular Abdomen:  soft, NT/ND, no masses Skin: without rashes Vascular Exam/Pulses: Symmetrical radial pulses; wound VAC and bandage on right foot left in place however there was a brisk right PT and AT signal above the level of the Ace bandage. Extremities: Right foot wound VAC left in place Musculoskeletal: no muscle wasting or atrophy  Neurologic: A&O X 3;  No focal weakness or paresthesias are detected; speech is fluent/normal Psychiatric:  The pt has Normal affect. Lymph:  Unremarkable  CBC    Component Value Date/Time   WBC 6.7 07/14/2018 0156   RBC 3.90 (L) 07/14/2018 0156   HGB 15.3 07/15/2018 1726   HGB 15.2 05/11/2018 1219   HCT 45.0 07/15/2018 1726   PLT 337 07/14/2018 0156   PLT 245 05/11/2018 1219   MCV 106.4 (H) 07/14/2018 0156   MCH 32.1 07/14/2018 0156   MCHC 30.1 07/14/2018 0156   RDW 15.8 (H) 07/14/2018 0156   LYMPHSABS 0.8 07/13/2018 1252   MONOABS 0.6 07/13/2018 1252   EOSABS 0.1 07/13/2018 1252   BASOSABS 0.0 07/13/2018 1252    BMET    Component Value Date/Time   NA 137 07/15/2018 1726   K 5.2 (H) 07/15/2018 1726   CL 97 (L) 07/14/2018 0156   CO2 24 07/14/2018 0156   GLUCOSE 84 07/15/2018 1726   BUN 47 (H) 07/14/2018 0156   CREATININE 9.66 (H) 07/14/2018 0156   CREATININE 9.49 (HH) 05/11/2018 1219   CALCIUM 8.9 07/14/2018 0156   GFRNONAA 5 (L) 07/14/2018 0156   GFRNONAA 5 (L) 05/11/2018 1219   GFRAA 6 (L) 07/14/2018 0156   GFRAA 6 (L) 05/11/2018 1219    COAGS: Lab Results  Component Value Date   INR 1.24 03/12/2013   INR 1.27 08/24/2012   INR 0.9 ratio 06/21/2010     Non-Invasive Vascular Imaging:   LOWER EXTREMITY DOPPLER STUDY  Indications: Ulceration.  High Risk Factors: Diabetes.  Other Factors: Renal Dialysis Pt with lt fistula. Performing Technologist: Birdena Crandall, [place]  Examination Guidelines: A complete evaluation includes at minimum, Doppler waveform signals and systolic blood pressure reading at the level of  bilateral brachial, anterior tibial, and posterior tibial arteries, when vessel segments are accessible. Bilateral testing is considered an integral part of a complete examination. Photoelectric Plethysmograph (PPG) waveforms and toe systolic pressure readings are included as required and additional duplex testing as needed. Limited examinations for reoccurring indications may be performed as noted.   ABI Findings: +---------+------------------+-----+-------------------+--------+ Right  Rt Pressure (mmHg)IndexWaveform      Comment  +---------+------------------+-----+-------------------+--------+ Brachial 157           triphasic          +---------+------------------+-----+-------------------+--------+ PTA   97        0.62 dampened monophasic     +---------+------------------+-----+-------------------+--------+ DP    94        0.60 dampened monophasic     +---------+------------------+-----+-------------------+--------+ Great Toe25        0.16                +---------+------------------+-----+-------------------+--------+  +---------+------------------+-----+----------+----------------------------+ Left   Lt Pressure (mmHg)IndexWaveform Comment            +---------+------------------+-----+----------+----------------------------+ Brachial                  Resyructed limb wuth fistula +---------+------------------+-----+----------+----------------------------+ PTA   79        0.50 monophasic               +---------+------------------+-----+----------+----------------------------+ DP    91        0.58 monophasic               +---------+------------------+-----+----------+----------------------------+ Great Toe255        1.62      Non compressible        +---------+------------------+-----+----------+----------------------------+  +-------+-----------+---------------+------------+------------+ ABI/TBIToday's ABIToday's TBI  Previous ABIPrevious TBI +-------+-----------+---------------+------------+------------+ Right 0.62    0.16                   +-------+-----------+---------------+------------+------------+ Left  0.58    Noncompressible             +-------+-----------+---------------+------------+------------+     Summary: Right: Resting right ankle-brachial index indicates moderate right lower extremity arterial disease. The right toe-brachial index is abnormal.  Left: Resting left ankle-brachial index indicates moderate left lower extremity arterial disease. TBIs are unreliable.     ASSESSMENT/PLAN: This is a 72 y.o. male with abnormal ABIs, osteomyelitis right foot status post fifth metatarsal amputation by podiatry; slow to mature left brachiocephalic fistula    - Right ABI of 0.6 - Unsure if patient will have adequate blood flow to heal infection/surgical intervention of right foot - He will likely require right lower extremity arteriogram with possible intervention; okay to delay this until podiatry has completed their treatment plan - Patient also with slow mature left arm AV fistula despite creation about 3 months ago - Potentially we could perform a left arm fistulogram at the same time of right lower extremity arteriogram - This case will be discussed with on-call vascular surgeon Dr. Trula Slade who will evaluate the patient later today and provide treatment plan   Dagoberto Ligas PA-C [facility] [phone]  I agree with the above.  I have seen and evaluated the patient.  He went back to the OR Saturday with [name] for further debridement of right foot.  Will plan for angio to improve blood flow to right foot and to evaluate fistula on  MOnday or Tuesday  Annamarie Major

## 2018-07-16 NOTE — Progress Notes (Signed)
[facility] Progress Note   Dialysis Orders: MWF at [facility] 4 hours, BFR 450, DFR 600, EDW 79.5 kg, 3K/2 calcium, heparin 5000 unit bolus, calcitriol 1 mcg p.o. 3 times weekly, left IJ TDC  Assessment/Plan: 1. DM foot infection w/draining wound - Osteo noted on bone scan. Cultures pending. On ABX.  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.  Wound vac in place. Plans to return to OR tomorrow for further debridement/closure. Per podiatry.  2. ESRD - on HD MWF.  Last K 5.2. Orders written for HD today per regular schedule. TDC exchanged yesterday. 3. Anemia of CKD- Hgb12.5. No indication for ESA at this time. 4. Secondary hyperparathyroidism - Continue binders and sensipar.  5. Hypotension/volume - BP soft, on midodrine.  Appears euvolemic on exam. Titrate down as tolerated.  6. Nutrition - Renal diet w/fluid restrictions. Alb low, cont protein supplements.   Jen Mow, PA-C [facility]  Pager: [phone] 07/16/2018,10:18 AM  LOS: 3 days   Subjective:    Sitting in bedside chair.  Denies pain, SOB, n/v/d, fever and chills. [place] exchanged prior to toe amputation and debridement yesterday.   Objective Vitals:   07/16/18 0001 07/16/18 0350 07/16/18 0400 07/16/18 0956  BP: 98/80 (!) 89/55 (!) 96/53 110/62  Pulse: 80 99  95  Resp: 16 16  20   Temp: 97.8 F (36.6 C) 98.9 F (37.2 C)  98.6 F (37 C)  TempSrc: Oral Oral    SpO2: 98% 96%  100%  Weight:      Height:       Physical Exam General:NAD, pleasant male sitting in bedside chair Heart:RRR, no mrg Lungs:CTAB, no wheezes, rales or rhonchi Abdomen:soft, NTND Extremities: no LE edema, R foot dressed, wound vac in place Dialysis Access: [facility] in left chest    Filed Weights   07/14/18 0812 07/14/18 1337 07/15/18 0600  Weight: 81 kg 79.8 kg 83 kg    Intake/Output Summary (Last 24 hours) at 07/16/2018 1018 Last data filed at 07/15/2018 1940 Gross per 24  hour  Intake 590 ml  Output 10 ml  Net 580 ml    Additional Objective Labs: Basic Metabolic Panel: Recent Labs  Lab 07/13/18 1252 07/14/18 0156 07/15/18 1726  NA 136 139 137  K 5.1 4.7 5.2*  CL 97* 97*  --   CO2 23 24  --   GLUCOSE 104* 93 84  BUN 41* 47*  --   CREATININE 9.49* 9.66*  --   CALCIUM 9.0 8.9  --    Liver Function Tests: Recent Labs  Lab 07/13/18 1252  AST 19  ALT 11  ALKPHOS 56  BILITOT 0.8  PROT 8.1  ALBUMIN 3.1*   CBC: Recent Labs  Lab 07/13/18 1252 07/14/18 0156 07/15/18 1726  WBC 7.3 6.7  --   NEUTROABS 5.7  --   --   HGB 13.4 12.5* 15.3  HCT 43.2 41.5 45.0  MCV 108.0* 106.4*  --   PLT 308 337  --    Blood Culture    Component Value Date/Time   SDES TISSUE 07/15/2018 1850   SPECREQUEST SOFT TISSUE RIGHT FOOT 07/15/2018 1850   CULT PENDING 07/15/2018 1850   REPTSTATUS PENDING 07/15/2018 1850   CBG: Recent Labs  Lab 07/15/18 1302 07/15/18 1857 07/15/18 1928 07/15/18 2310 07/16/18 0630  GLUCAP 107* 55* 65* 151* 102*   Lab Results  Component Value Date   INR 1.24 03/12/2013   INR 1.27 08/24/2012   INR  0.9 ratio 06/21/2010   Studies/Results: Ir Fluoro Guide Cv Line Left  Result Date: 07/15/2018 INDICATION: Continued poor function of tunneled left IJ hemodialysis catheter, post revision x 2. EXAM: TUNNELED CENTRAL VENOUS HEMODIALYSIS CATHETER REVISION WITH FLUOROSCOPIC GUIDANCE VENOUS ANGIOPLASTY OF CENTRAL VENOUS STENOSIS MEDICATIONS: patient is already on adequate prophylactic antibiotic coverage ANESTHESIA/SEDATION: Intravenous Fentanyl and Versed were administered as conscious sedation during continuous monitoring of the patient's level of consciousness and physiological / cardiorespiratory status by the radiology RN, with a total moderate sedation time of 18 minutes. FLUOROSCOPY TIME:  3.2 minute; 182  uGym2 DAP COMPLICATIONS: None immediate. PROCEDURE: Informed written consent was obtained from the patient after a discussion  of the risks, benefits, and alternatives to treatment. Questions regarding the procedure were encouraged and answered. The left neck, catheter, and chest were prepped with chlorhexidine in a sterile fashion, and a sterile drape was applied covering the operative field. Maximum barrier sterile technique with sterile gowns and gloves were used for the procedure. A timeout was performed prior to the initiation of the procedure. The subcutaneous tract of the left IJ tunneled catheter was infiltrated with 1% lidocaine. The catheter was partially withdrawn and central venography was performed through the blue venous return lumen of the catheter. 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 Some eccentric fibrin sheath/thrombus at the site of the prior catheter tip at the cavoatrial junction. Distal SVC is patent with good flow into the right atrium, minimal reflux into the IVC. Catheter was exchanged over a Bentson wire for a 5 [place] Kumpe catheter, which allowed access to the IVC. Catheter exchanged for a 12 mm x 4 cm atlas angioplasty balloon, advanced into distal left innominate vein and proximal SVC for overlapping venous angioplasty at 18 atmospheres. The balloon catheter was then advanced into the IVC and exchanged over a stiff Amplatz Glidewire for a new 28 cm Palindrome tunneled hemodialysis catheter. This was advanced to the mid right atrium. Both lumens aspirated and flushed easily. Good catheter position confirmed on chest rate fluoroscopy, documentation stored. The catheter was secured externally with 0 Prolene suture in flushed per protocol. Dressings were applied. The patient tolerated the procedure well without immediate post procedural complication. IMPRESSION: 1. 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 2. The lesions responded well  to 12 mm balloon angioplasty. 3. Technically successful exchange and revision of tunneled left IJ hemodialysis catheter, a 28 cm Palindrome HD catheter advanced to the right atrium. Electronically Signed   By: Lucrezia Europe M.D.   On: 07/15/2018 17:22   Ir US Guide Vasc Access Left  Result Date: 07/15/2018 INDICATION: Continued poor function of tunneled left IJ hemodialysis catheter, post revision x 2. EXAM: TUNNELED CENTRAL VENOUS HEMODIALYSIS CATHETER REVISION WITH FLUOROSCOPIC GUIDANCE VENOUS ANGIOPLASTY OF CENTRAL VENOUS STENOSIS MEDICATIONS: patient is already on adequate prophylactic antibiotic coverage ANESTHESIA/SEDATION: Intravenous Fentanyl and Versed were administered as conscious sedation during continuous monitoring of the patient's level of consciousness and physiological / cardiorespiratory status by the radiology RN, with a total moderate sedation time of 18 minutes. FLUOROSCOPY TIME:  3.2 minute; 993  uGym2 DAP COMPLICATIONS: None immediate. PROCEDURE: Informed written consent was obtained from the patient after a discussion of the risks, benefits, and alternatives to treatment. Questions regarding the procedure  were encouraged and answered. The left neck, catheter, and chest were prepped with chlorhexidine in a sterile fashion, and a sterile drape was applied covering the operative field. Maximum barrier sterile technique with sterile gowns and gloves were used for the procedure. A timeout was performed prior to the initiation of the procedure. The subcutaneous tract of the left IJ tunneled catheter was infiltrated with 1% lidocaine. The catheter was partially withdrawn and central venography was performed through the blue venous return lumen of the catheter. 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 Some eccentric fibrin  sheath/thrombus at the site of the prior catheter tip at the cavoatrial junction. Distal SVC is patent with good flow into the right atrium, minimal reflux into the IVC. Catheter was exchanged over a Bentson wire for a 5 [place] Kumpe catheter, which allowed access to the IVC. Catheter exchanged for a 12 mm x 4 cm atlas angioplasty balloon, advanced into distal left innominate vein and proximal SVC for overlapping venous angioplasty at 18 atmospheres. The balloon catheter was then advanced into the IVC and exchanged over a stiff Amplatz Glidewire for a new 28 cm Palindrome tunneled hemodialysis catheter. This was advanced to the mid right atrium. Both lumens aspirated and flushed easily. Good catheter position confirmed on chest rate fluoroscopy, documentation stored. The catheter was secured externally with 0 Prolene suture in flushed per protocol. Dressings were applied. The patient tolerated the procedure well without immediate post procedural complication. IMPRESSION: 1. 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 2. The lesions responded well  to 12 mm balloon angioplasty. 3. Technically successful exchange and revision of tunneled left IJ hemodialysis catheter, a 28 cm Palindrome HD catheter advanced to the right atrium. Electronically Signed   By: Lucrezia Europe M.D.   On: 07/15/2018 17:22   Dg Foot 2 Views Right  Result Date: 07/15/2018 CLINICAL DATA:  Postop irrigation and debridement. Fifth metatarsal resection. History of diabetes. EXAM: RIGHT FOOT - 2 VIEW COMPARISON:  Preop study from 07/13/2018 FINDINGS: The patient has undergone amputation of the distal half of the right fifth metatarsal with postsurgical change involving the soft tissues of the lateral forefoot. Vacuum device projects over the lateral aspect of the forefoot at  site of amputation. Redemonstration of comminuted intra-articular fracture involving the head of the first proximal phalanx. Osteoarthritis of the toes otherwise noted. No additional new findings. IMPRESSION: 1. 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 2. Intra-articular fracture of the first proximal phalangeal head as before with comminution. No significant change. Electronically Signed   By: Ashley Royalty M.D.   On: 07/15/2018 19:32   Nm Bone Scan 3 Phase Lower Extremity  Result Date: 07/15/2018 CLINICAL DATA:  Soft tissue ulceration adjacent to the fifth metatarsal. Osteomyelitis suggested on radiograph. EXAM: NUCLEAR MEDICINE 3-PHASE BONE SCAN TECHNIQUE: Radionuclide angiographic images, immediate static blood pool images, and 3-hour delayed static images were obtained of the feet after intravenous injection of radiopharmaceutical. RADIOPHARMACEUTICALS:  21.9 mCi Tc-51m MDP IV COMPARISON:  Radiograph 07/13/2018 FINDINGS: Vascular phase: Mild diffuse hyperemia to the RIGHT foot compared to the LEFT. Blood pool phase: Diffuse increased blood pool activity in the RIGHT foot compared to the LEFT. Some focality in the great  toe on the RIGHT. Focality also the head of the fifth metacarpal Delayed phase: Focal activity in the proximal phalanx of the first digit and distal fifth metacarpal. IMPRESSION: Focal delayed activity in the proximal phalanx of the first digit and distal aspect of the fifth metatarsal of the RIGHT foot. With comparison to radiograph 07/13/2018, the proximal phalanx first digit uptake is favored posttraumatic. Uptake at the distal aspect of the fifth metatarsal is favored osteomyelitis. Electronically Signed   By: Suzy Bouchard M.D.   On: 07/15/2018 12:44   Ir Pta Venous Except Dialysis Circuit  Result Date: 07/15/2018 INDICATION: Continued poor function of tunneled left IJ hemodialysis catheter, post revision x 2. EXAM: TUNNELED  CENTRAL VENOUS HEMODIALYSIS CATHETER REVISION WITH FLUOROSCOPIC GUIDANCE VENOUS ANGIOPLASTY OF CENTRAL VENOUS STENOSIS MEDICATIONS: patient is already on adequate prophylactic antibiotic coverage ANESTHESIA/SEDATION: Intravenous Fentanyl and Versed were administered as conscious sedation during continuous monitoring of the patient's level of consciousness and physiological / cardiorespiratory status by the radiology RN, with a total moderate sedation time of 18 minutes. FLUOROSCOPY TIME:  3.2 minute; 884  uGym2 DAP COMPLICATIONS: None immediate. PROCEDURE: Informed written consent was obtained from the patient after a discussion of the risks, benefits, and alternatives to treatment. Questions regarding the procedure were encouraged and answered. The left neck, catheter, and chest were prepped with chlorhexidine in a sterile fashion, and a sterile drape was applied covering the operative field. Maximum barrier sterile technique with sterile gowns and gloves were used for the procedure. A timeout was performed prior to the initiation of the procedure. The subcutaneous tract of the left IJ tunneled catheter was infiltrated with 1% lidocaine. The catheter was partially withdrawn and central venography was performed through the blue venous return lumen of the catheter. 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 Some eccentric fibrin sheath/thrombus at the site of the prior catheter tip at the cavoatrial junction. Distal SVC is patent with good flow into the right atrium, minimal reflux into the IVC. Catheter was exchanged over a Bentson wire for a 5 [place] Kumpe catheter, which allowed access to the IVC. Catheter exchanged for a 12 mm x 4 cm atlas angioplasty balloon, advanced into distal left innominate vein and proximal SVC for overlapping venous angioplasty at 18 atmospheres. The balloon catheter was then advanced into the IVC and  exchanged over a stiff Amplatz Glidewire for a new 28 cm Palindrome tunneled hemodialysis catheter. This was advanced to the mid right atrium. Both lumens aspirated and flushed easily. Good catheter position confirmed on chest rate fluoroscopy, documentation stored. The catheter was secured externally with 0 Prolene suture in flushed per protocol. Dressings were applied. The patient tolerated the procedure well without immediate post procedural complication. IMPRESSION: 1. 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 2. The lesions responded well  to 12 mm balloon angioplasty. 3. Technically successful exchange and revision of tunneled left IJ hemodialysis catheter, a 28 cm Palindrome HD catheter advanced to the right atrium. Electronically Signed   By: Lucrezia Europe M.D.   On: 07/15/2018 17:22   Vas [place] Burnard Bunting With/wo Tbi  Result Date: 07/14/2018 LOWER EXTREMITY DOPPLER STUDY Indications: Ulceration. High Risk Factors: Diabetes. Other Factors: Renal Dialysis Pt with lt fistula.  Performing Technologist: Birdena Crandall, [place]  Examination  Guidelines: A complete evaluation includes at minimum, Doppler waveform signals and systolic blood pressure reading at the level of bilateral brachial, anterior tibial, and posterior tibial arteries, when vessel segments are accessible. Bilateral testing is considered an integral part of a complete examination. Photoelectric Plethysmograph (PPG) waveforms and toe systolic pressure readings are included as required and additional duplex testing as needed. Limited examinations for reoccurring indications may be performed as noted.  ABI Findings: +---------+------------------+-----+-------------------+--------+ Right    Rt Pressure (mmHg)IndexWaveform           Comment   +---------+------------------+-----+-------------------+--------+ Brachial 157                    triphasic                   +---------+------------------+-----+-------------------+--------+ PTA      97                0.62 dampened monophasic         +---------+------------------+-----+-------------------+--------+ DP       94                0.60 dampened monophasic         +---------+------------------+-----+-------------------+--------+ Great Toe25                0.16                             +---------+------------------+-----+-------------------+--------+ +---------+------------------+-----+----------+----------------------------+ Left     Lt Pressure (mmHg)IndexWaveform  Comment                      +---------+------------------+-----+----------+----------------------------+ Brachial                                  Resyructed limb wuth fistula +---------+------------------+-----+----------+----------------------------+ PTA      79                0.50 monophasic                             +---------+------------------+-----+----------+----------------------------+ DP       91                0.58 monophasic                             +---------+------------------+-----+----------+----------------------------+ Great Toe255               1.62           Non compressible             +---------+------------------+-----+----------+----------------------------+ +-------+-----------+---------------+------------+------------+ ABI/TBIToday's ABIToday's TBI    Previous ABIPrevious TBI +-------+-----------+---------------+------------+------------+ Right  0.62       0.16                                    +-------+-----------+---------------+------------+------------+ Left   0.58       Noncompressible                         +-------+-----------+---------------+------------+------------+  Summary: Right: Resting right ankle-brachial index  indicates moderate right lower extremity arterial disease. The right toe-brachial index is abnormal. Left: Resting left ankle-brachial index indicates moderate left lower extremity arterial  disease. TBIs are unreliable.  *See table(s) above for measurements and observations.  Electronically signed by Quay Burow MD on 07/14/2018 at 4:46:20 PM.    Final     Medications: . sodium chloride 10 mL/hr at 07/15/18 1721  . cefTRIAXone (ROCEPHIN)  IV Stopped (07/13/18 1905)  . metronidazole 500 mg (07/16/18 0848)  . vancomycin 750 mg (07/14/18 1126)   . aspirin EC  81 mg Oral Daily  . Chlorhexidine Gluconate Cloth  6 each Topical Q0600  . cinacalcet  30 mg Oral Q breakfast  . feeding supplement (NEPRO CARB STEADY)  237 mL Oral Q24H  . feeding supplement (PRO-STAT SUGAR FREE 64)  30 mL Oral BID WC  . ferric citrate  630 mg Oral TID WC  . gabapentin  100 mg Oral QHS  . insulin aspart  0-15 Units Subcutaneous TID WC  . insulin aspart  0-5 Units Subcutaneous QHS  . lidocaine  20 mL Infiltration Once  . midodrine  10 mg Oral Once per day on Mon Wed Fri  . multivitamin  1 tablet Oral QHS

## 2018-07-16 NOTE — H&P (View-Only) (Signed)
Hospital Consult    Reason for Consult:  Abnormal ABIs; evaluation of L arm AV fistula Requesting Physician:  Dr. Karleen Hampshire MRN #:  [id number]  History of Present Illness: This is a 72 y.o. male with past medical history significant for type 2 diabetes mellitus, PAD, and end-stage renal disease on hemodialysis.  He was seen in consultation due to a finding of abnormal ABIs in the setting of right foot osteomyelitis.  He underwent amputation of fifth right metatarsal by podiatrist [name] yesterday.  ABIs on 07/14/2018 right was 0.6.  There are tentative plans for patient to return to the operating room for further debridement and possible closure by [name] tomorrow 07/17/2018.  He denies any history of claudication or rest pain in bilateral lower extremities.  He was unaware of any wound however says he believes there was a blister that ruptured this past Saturday.  He is taking Plavix per cardiology.  Left arm dialysis access also evaluated.  He has history of a failed left radiocephalic fistula as well as a left brachiocephalic fistula created by Dr. Carlis Abbott on 04/22/2018.  Left IJ TDC was exchanged by IR yesterday.  Patient states they have not yet attempted to cannulize left arm brachiocephalic fistula.  Past Medical History:  Diagnosis Date  . Anemia   . Arthritis    Gout  . Automatic implantable cardioverter-defibrillator in situ    [employer]  . CHF   . ESRD on dialysis [facility])    [name] 03/11/2013 (03/11/2013) dialysis M/W/F  . GERD (gastroesophageal reflux disease)   . Gout    "once a year"  . Heart murmur   . HYPERCHOLESTEROLEMIA, MIXED   . HYPERTENSION   . Macular degeneration   . 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 Pacemaker   . Peripheral arterial disease (HCC)    left fifth toe ulcer, healing  . Pneumonia   . Shortness of breath   . 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)    left fifth toe    Past Surgical History:  Procedure  Laterality Date  . AV FISTULA PLACEMENT Right 12/13/2012   Procedure: ARTERIOVENOUS (AV) FISTULA CREATION;  Surgeon: [name], MD;  Location: [place];  Service: Vascular;  Laterality: Right;  Ultrasound guided  . AV FISTULA PLACEMENT Left 05/07/2016   Procedure: LEFT RADIOCEPHALIC ARTERIOVENOUS (AV) FISTULA CREATION;  Surgeon: [name], MD;  Location: [place];  Service: Vascular;  Laterality: Left;  . BASCILIC VEIN TRANSPOSITION Right 03/26/2016   Procedure: RIGHT BASILIC VEIN TRANSPOSITION;  Surgeon: [name], MD;  Location: [place];  Service: Vascular;  Laterality: Right;  . CARDIAC CATHETERIZATION    . CARDIAC DEFIBRILLATOR PLACEMENT     Chemical engineer  . EYE SURGERY Bilateral    Cataract  . FISTULOGRAM Left 04/22/2018   Procedure: FISTULOGRAM UPPER EXTREMITY;  Surgeon: [name], MD;  Location: [place];  Service: Vascular;  Laterality: Left;  . I&D EXTREMITY Right 07/15/2018   Procedure: IRRIGATION AND DEBRIDEMENT RIGHT FOOT;  Surgeon: [name], DPM;  Location: [place];  Service: Podiatry;  Laterality: Right;  . INCISION AND DRAINAGE ABSCESS / HEMATOMA OF BURSA / KNEE / THIGH Left 2012   "knee" (03/11/2013)  . INSERT / REPLACE / REMOVE PACEMAKER    . INSERTION OF DIALYSIS CATHETER Left 04/22/2018   Procedure: INSERTION OF DIALYSIS CATHETER;  Surgeon: [name], MD;  Location: [place];  Service: Vascular;  Laterality: Left;  . IR  FLUORO GUIDE CV LINE LEFT  07/15/2018  . IR PTA VENOUS EXCEPT DIALYSIS CIRCUIT  07/15/2018  . IR US GUIDE VASC ACCESS LEFT  07/15/2018  . METATARSAL HEAD EXCISION Right 07/15/2018   Procedure: METATARSAL RESECTION;  Surgeon: [name], DPM;  Location: [place];  Service: Podiatry;  Laterality: Right;  . REVISON OF ARTERIOVENOUS FISTULA Right 8/75/6433   Procedure: Plication OF Right Arm ARTERIOVENOUS FISTULA;  Surgeon: Elam Dutch, MD;  Location: [facility] OR;  Service: Vascular;  Laterality: Right;  . REVISON OF ARTERIOVENOUS FISTULA  Left 04/22/2018   Procedure: REVISION OF RADIOCEPHALIC ARTERIOVENOUS FISTULA;  Surgeon: [name], MD;  Location: [place];  Service: Vascular;  Laterality: Left;  . SHUNTOGRAM N/A 05/31/2013   Procedure: Earney Mallet;  Surgeon: [name], MD;  Location: [facility] CATH LAB;  Service: Cardiovascular;  Laterality: N/A;    Allergies  Allergen Reactions  . No Known Allergies     Prior to Admission medications   Medication Sig Start Date End Date Taking? Authorizing Provider  aspirin EC 81 MG tablet Take 81 mg by mouth daily.   Yes [provider]  AURYXIA 1 GM 210 MG(Fe) tablet Take 630 mg by mouth 3 (three) times daily with meals.  11/12/16  Yes [provider]  clopidogrel (PLAVIX) 75 MG tablet Take 75 mg by mouth daily.   Yes [provider]  colchicine 0.6 MG tablet Take 0.6 mg by mouth daily as needed (for gout flare ups).  02/26/18  Yes [provider]  gabapentin (NEURONTIN) 100 MG capsule TAKE 1 CAPSULE (100 MG TOTAL) BY MOUTH AT BEDTIME. 07/12/18  Yes Cameron Sprang, MD  lactulose, encephalopathy, (CHRONULAC) 10 GM/15ML SOLN Take 20 g by mouth daily as needed (constipation).  05/26/18  Yes [provider]  lidocaine-prilocaine (EMLA) cream Apply 1 application topically See admin instructions. Apply small amount to access site 1 to 2 hours before dialysis. 04/12/18  Yes [provider]  midodrine (PROAMATINE) 10 MG tablet Take 10 mg by mouth 3 (three) times a week.  04/26/18  Yes [provider]  oxyCODONE-acetaminophen (PERCOCET) 5-325 MG tablet Take 1 tablet by mouth every 6 (six) hours as needed for severe pain. 04/22/18  Yes Rhyne, Samantha J, PA-C  SENSIPAR 30 MG tablet Take 30 mg by mouth daily. 04/21/16  Yes [provider]    Social History   Socioeconomic History  . Marital status: Married    Spouse name: [name]  . Number of children: 2  . Years of education: 28  . Highest education level: Not on file    Occupational History  . Occupation: diasbled  Social Needs  . Financial resource strain: Not on file  . Food insecurity:    Worry: Not on file    Inability: Not on file  . Transportation needs:    Medical: Not on file    Non-medical: Not on file  Tobacco Use  . Smoking status: Former Smoker    Packs/day: 0.75    Years: 30.00    Pack years: 22.50    Types: Cigarettes    Last attempt to quit: 11/25/1992    Years since quitting: 25.6  . Smokeless tobacco: Never Used  Substance and Sexual Activity  . Alcohol use: Not Currently  . Drug use: Not Currently    Types: Marijuana  . Sexual activity: Yes  Lifestyle  . Physical activity:    Days per week: Not on file    Minutes per session:  Not on file  . Stress: Not on file  Relationships  . Social connections:    Talks on phone: Not on file    Gets together: Not on file    Attends religious service: Not on file    Active member of club or organization: Not on file    Attends meetings of clubs or organizations: Not on file    Relationship status: Not on file  . Intimate partner violence:    Fear of current or ex partner: Not on file    Emotionally abused: Not on file    Physically abused: Not on file    Forced sexual activity: Not on file  Other Topics Concern  . 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 Heart disease Father   . CAD Father     ROS: Otherwise negative unless mentioned in HPI  Physical Examination  Vitals:   07/16/18 0400 07/16/18 0956  BP: (!) 96/53 110/62  Pulse:  95  Resp:  20  Temp:  98.6 F (37 C)  SpO2:  100%   Body mass index is 27.82 kg/m.  General:  WDWN in NAD Gait: Not observed HENT: WNL, normocephalic Pulmonary: normal non-labored breathing, without Rales, rhonchi,  wheezing Cardiac:  regular Abdomen:  soft, NT/ND, no masses Skin: without rashes Vascular Exam/Pulses: Symmetrical radial pulses; wound VAC and bandage on right foot left in place however there was a brisk right PT and AT signal above the level of the Ace bandage. Extremities: Right foot wound VAC left in place Musculoskeletal: no muscle wasting or atrophy  Neurologic: A&O X 3;  No focal weakness or paresthesias are detected; speech is fluent/normal Psychiatric:  The pt has Normal affect. Lymph:  Unremarkable  CBC    Component Value Date/Time   WBC 6.7 07/14/2018 0156   RBC 3.90 (L) 07/14/2018 0156   HGB 15.3 07/15/2018 1726   HGB 15.2 05/11/2018 1219   HCT 45.0 07/15/2018 1726   PLT 337 07/14/2018 0156   PLT 245 05/11/2018 1219   MCV 106.4 (H) 07/14/2018 0156   MCH 32.1 07/14/2018 0156   MCHC 30.1 07/14/2018 0156   RDW 15.8 (H) 07/14/2018 0156   LYMPHSABS 0.8 07/13/2018 1252   MONOABS 0.6 07/13/2018 1252   EOSABS 0.1 07/13/2018 1252   BASOSABS 0.0 07/13/2018 1252    BMET    Component Value Date/Time   NA 137 07/15/2018 1726   K 5.2 (H) 07/15/2018 1726   CL 97 (L) 07/14/2018 0156   CO2 24 07/14/2018 0156   GLUCOSE 84 07/15/2018 1726   BUN 47 (H) 07/14/2018 0156   CREATININE 9.66 (H) 07/14/2018 0156   CREATININE 9.49 (HH) 05/11/2018 1219   CALCIUM 8.9 07/14/2018 0156   GFRNONAA 5 (L) 07/14/2018 0156   GFRNONAA 5 (L) 05/11/2018 1219   GFRAA 6 (L) 07/14/2018 0156   GFRAA 6 (L) 05/11/2018 1219    COAGS: Lab Results  Component Value Date   INR 1.24 03/12/2013   INR 1.27 08/24/2012   INR 0.9 ratio 06/21/2010     Non-Invasive Vascular Imaging:   LOWER EXTREMITY DOPPLER STUDY  Indications: Ulceration.  High Risk Factors: Diabetes.  Other Factors: Renal Dialysis Pt with lt fistula. Performing Technologist: Birdena Crandall, [place]  Examination Guidelines: A complete evaluation includes at minimum, Doppler waveform signals and systolic blood pressure reading at the level of  bilateral brachial, anterior tibial, and posterior tibial arteries, when vessel segments are accessible. Bilateral testing is considered an integral part of a complete examination. Photoelectric Plethysmograph (PPG) waveforms and toe systolic pressure readings are included as required and additional duplex testing as needed. Limited examinations for reoccurring indications may be performed as noted.   ABI Findings: +---------+------------------+-----+-------------------+--------+ Right  Rt Pressure (mmHg)IndexWaveform      Comment  +---------+------------------+-----+-------------------+--------+ Brachial 157           triphasic          +---------+------------------+-----+-------------------+--------+ PTA   97        0.62 dampened monophasic     +---------+------------------+-----+-------------------+--------+ DP    94        0.60 dampened monophasic     +---------+------------------+-----+-------------------+--------+ Great Toe25        0.16                +---------+------------------+-----+-------------------+--------+  +---------+------------------+-----+----------+----------------------------+ Left   Lt Pressure (mmHg)IndexWaveform Comment            +---------+------------------+-----+----------+----------------------------+ Brachial                  Resyructed limb wuth fistula +---------+------------------+-----+----------+----------------------------+ PTA   79        0.50 monophasic               +---------+------------------+-----+----------+----------------------------+ DP    91        0.58 monophasic               +---------+------------------+-----+----------+----------------------------+ Great Toe255        1.62      Non compressible        +---------+------------------+-----+----------+----------------------------+  +-------+-----------+---------------+------------+------------+ ABI/TBIToday's ABIToday's TBI  Previous ABIPrevious TBI +-------+-----------+---------------+------------+------------+ Right 0.62    0.16                   +-------+-----------+---------------+------------+------------+ Left  0.58    Noncompressible             +-------+-----------+---------------+------------+------------+     Summary: Right: Resting right ankle-brachial index indicates moderate right lower extremity arterial disease. The right toe-brachial index is abnormal.  Left: Resting left ankle-brachial index indicates moderate left lower extremity arterial disease. TBIs are unreliable.     ASSESSMENT/PLAN: This is a 72 y.o. male with abnormal ABIs, osteomyelitis right foot status post fifth metatarsal amputation by podiatry; slow to mature left brachiocephalic fistula    - Right ABI of 0.6 - Unsure if patient will have adequate blood flow to heal infection/surgical intervention of right foot - He will likely require right lower extremity arteriogram with possible intervention; okay to delay this until podiatry has completed their treatment plan - Patient also with slow mature left arm AV fistula despite creation about 3 months ago - Potentially we could perform a left arm fistulogram at the same time of right lower extremity arteriogram - This case will be discussed with on-call vascular surgeon Dr. Trula Slade who will evaluate the patient later today and provide treatment plan   Dagoberto Ligas PA-C [facility] [phone]  I agree with the above.  I have seen and evaluated the patient.  He went back to the OR Saturday with [name] for further debridement of right foot.  Will plan for angio to improve blood flow to right foot and to evaluate fistula on  MOnday or Tuesday  Annamarie Major

## 2018-07-16 NOTE — Progress Notes (Signed)
Subjective:  Patient ID: Timothy Faulkner, male    DOB: 07/19/1946,  MRN: [id number]  Seen at bedside. Looking visibly better, more talkative today. No complaints. Objective:   Vitals:   07/16/18 2300 07/16/18 2307  BP: (!) 90/51 98/63  Pulse: 98 (!) 103  Resp:  18  Temp:  97.7 F (36.5 C)  SpO2:  95%   General AA&O x3. Normal mood and affect.  Vascular Right foot warm Brisk capillary refill to all digits. Pedal hair present.  Neurologic Epicritic sensation grossly intact.  Dermatologic Wound Vac on and functioning with ss drainage in cannister. No evidence of seal loss. No proximal warmth erythema or streaking.  Orthopedic: MMT 5/5 in dorsiflexion, plantarflexion, inversion, and eversion. Normal joint ROM without pain or crepitus.   Results for orders placed or performed during the hospital encounter of 07/13/18 (from the past 24 hour(s))  Glucose, capillary     Status: Abnormal   Collection Time: 07/16/18  6:30 AM  Result Value Ref Range   Glucose-Capillary 102 (H) 70 - 99 mg/dL  Glucose, capillary     Status: Abnormal   Collection Time: 07/16/18 11:27 AM  Result Value Ref Range   Glucose-Capillary 132 (H) 70 - 99 mg/dL  Glucose, capillary     Status: Abnormal   Collection Time: 07/16/18  4:30 PM  Result Value Ref Range   Glucose-Capillary 121 (H) 70 - 99 mg/dL  CBC     Status: Abnormal   Collection Time: 07/16/18  7:11 PM  Result Value Ref Range   WBC 9.8 4.0 - 10.5 K/uL   RBC 3.66 (L) 4.22 - 5.81 MIL/uL   Hemoglobin 11.8 (L) 13.0 - 17.0 g/dL   HCT 38.8 (L) 39.0 - 52.0 %   MCV 106.0 (H) 80.0 - 100.0 fL   MCH 32.2 26.0 - 34.0 pg   MCHC 30.4 30.0 - 36.0 g/dL   RDW 16.0 (H) 11.5 - 15.5 %   Platelets 224 150 - 400 K/uL   nRBC 0.0 0.0 - 0.2 %  Renal function panel     Status: Abnormal   Collection Time: 07/16/18  7:11 PM  Result Value Ref Range   Sodium 136 135 - 145 mmol/L   Potassium 4.7 3.5 - 5.1 mmol/L   Chloride 97 (L) 98 - 111 mmol/L   CO2 21 (L) 22 - 32  mmol/L   Glucose, Bld 144 (H) 70 - 99 mg/dL   BUN 60 (H) 8 - 23 mg/dL   Creatinine, Ser 11.74 (H) 0.61 - 1.24 mg/dL   Calcium 8.3 (L) 8.9 - 10.3 mg/dL   Phosphorus 7.2 (H) 2.5 - 4.6 mg/dL   Albumin 2.6 (L) 3.5 - 5.0 g/dL   GFR calc non Af Amer 4 (L) >60 mL/min   GFR calc Af Amer 4 (L) >60 mL/min   Anion gap 18 (H) 5 - 15   Results for orders placed or performed during the hospital encounter of 07/13/18  Blood culture (routine x 2)     Status: None (Preliminary result)   Collection Time: 07/13/18 12:52 PM  Result Value Ref Range Status   Specimen Description BLOOD RIGHT HAND  Final   Special Requests   Final    BOTTLES DRAWN AEROBIC ONLY Blood Culture results may not be optimal due to an inadequate volume of blood received in culture bottles   Culture   Final    NO GROWTH 3 DAYS Performed at [facility], [address] [place], [place]  [address]    Report Status PENDING  Incomplete  Blood culture (routine x 2)     Status: None (Preliminary result)   Collection Time: 07/13/18 12:57 PM  Result Value Ref Range Status   Specimen Description BLOOD BLOOD RIGHT FOREARM  Final   Special Requests   Final    BOTTLES DRAWN AEROBIC AND ANAEROBIC Blood Culture results may not be optimal due to an inadequate volume of blood received in culture bottles   Culture   Final    NO GROWTH 3 DAYS Performed at [facility], [place] [address], [place], [place] [address]    Report Status PENDING  Incomplete  Surgical pcr screen     Status: None   Collection Time: 07/13/18  9:11 PM  Result Value Ref Range Status   MRSA, PCR NEGATIVE NEGATIVE Final   Staphylococcus aureus NEGATIVE NEGATIVE Final    Comment: (NOTE) The Xpert SA Assay (FDA approved for NASAL specimens in patients 46 years of age and older), is one component of a comprehensive surveillance program. It is not intended to diagnose infection nor to guide or monitor treatment. Performed at [facility], [place] [address], [place], [place] [address]   Aerobic/Anaerobic Culture (surgical/deep wound)     Status: None (Preliminary result)   Collection Time: 07/15/18  6:47 PM  Result Value Ref Range Status   Specimen Description BONE TISSUE  Final   Special Requests METATARSAL BONE FROM RIGHT FOOT  Final   Gram Stain   Final    MODERATE WBC PRESENT,BOTH PMN AND MONONUCLEAR NO ORGANISMS SEEN    Culture   Final    CULTURE REINCUBATED FOR BETTER GROWTH Performed at [facility], [place] [address], [place], [place] [address]    Report Status PENDING  Incomplete  Aerobic/Anaerobic Culture (surgical/deep wound)     Status: None (Preliminary result)   Collection Time: 07/15/18  6:50 PM  Result Value Ref Range Status   Specimen Description TISSUE  Final   Special Requests SOFT TISSUE RIGHT FOOT  Final   Gram Stain   Final    FEW WBC PRESENT,BOTH PMN AND MONONUCLEAR RARE GRAM NEGATIVE RODS    Culture   Final    CULTURE REINCUBATED FOR BETTER GROWTH Performed at [facility], [place] [address], [place],  [address]    Report Status PENDING  Incomplete    Assessment & Plan:  Patient was evaluated and treated and all questions answered.  R Foot Infection s/p Resection -Labs reviewed. -Path pending. -Micro pending -To OR tomorrow for repeat debridement and wound closure with possible filleted toe flap. -Keep WVAC on and functioning -NPO midnight -Heel WB to the right foot in surgical shoe.

## 2018-07-16 NOTE — Plan of Care (Signed)
  Problem: Activity: Goal: Risk for activity intolerance will decrease Outcome: Progressing   Problem: Pain Managment: Goal: General experience of comfort will improve Outcome: Progressing   Problem: Safety: Goal: Ability to remain free from injury will improve Outcome: Progressing   

## 2018-07-16 NOTE — Anesthesia Preprocedure Evaluation (Addendum)
Anesthesia Evaluation  Patient identified by MRN, date of birth, ID band Patient awake    Reviewed: Allergy & Precautions, NPO status , Patient's Chart, lab work & pertinent test results  History of Anesthesia Complications Negative for: history of anesthetic complications  Airway Mallampati: IV  TM Distance: >3 FB Neck ROM: Full    Dental  (+) Dental Advisory Given, Edentulous Upper, Chipped   Pulmonary sleep apnea , former smoker,    breath sounds clear to auscultation       Cardiovascular hypertension, + Peripheral Vascular Disease and +CHF  + pacemaker + Cardiac Defibrillator + Valvular Problems/Murmurs  Rhythm:Regular Rate:Normal   '14 TTE - LV cavity size was moderately dilated. EF 20% to 25%. Mild MR. LA mildly dilated. Moderate TR. PASP was moderatelyincreased. PA peak pressure: 84m Hg   Neuro/Psych negative neurological ROS  negative psych ROS   GI/Hepatic Neg liver ROS, GERD  Controlled,  Endo/Other  diabetes, Type 2  Renal/GU ESRF and DialysisRenal disease     Musculoskeletal  (+) Arthritis ,  Gout    Abdominal   Peds  Hematology negative hematology ROS (+)   Anesthesia Other Findings   Reproductive/Obstetrics                           Anesthesia Physical Anesthesia Plan  ASA: III  Anesthesia Plan: MAC   Post-op Pain Management:    Induction: Intravenous  PONV Risk Score and Plan: 1 and Propofol infusion and Treatment may vary due to age or medical condition  Airway Management Planned: Nasal Cannula and Natural Airway  Additional Equipment: None  Intra-op Plan:   Post-operative Plan:   Informed Consent: I have reviewed the patients History and Physical, chart, labs and discussed the procedure including the risks, benefits and alternatives for the proposed anesthesia with the patient or authorized representative who has indicated his/her understanding and  acceptance.     Plan Discussed with: CRNA and Anesthesiologist  Anesthesia Plan Comments:       Anesthesia Quick Evaluation

## 2018-07-16 NOTE — Progress Notes (Signed)
PROGRESS NOTE    Timothy Faulkner  BJS:[id number] DOB: 12/11/1945 DOA: 07/13/2018 PCP: [name], MD    Brief Narrative:  Timothy Faulkner is a 72 y.o. male with medical history significant of DM; diabetic foot ulcer/PAD; HTN; HLD; ESRD on MWF HD; and CHF with AICD presenting with foot pain.  He started with an ulcer that opened on Saturday, although he had a sore there for a few weeks. He was admitted for possible OM of the right foot.   Assessment & Plan:   Principal Problem:   Diabetic foot infection ([place]) Active Problems:   Essential hypertension   Automatic implantable cardioverter-defibrillator in situ   ESRD (end stage renal disease) on dialysis [facility])   Diabetic infection of right foot ([place])   Cardiomyopathy, dilated, nonischemic (HCC)   Peripheral arterial disease (HCC)   Osteomyelitis of right foot ([place])   Vascular calcification   Cellulitis and abscess of foot, except toes   Acute osteomyelitis of metatarsal bone of right foot (HCC)   Diabetic ulcer of midfoot associated with diabetes mellitus due to underlying condition, with necrosis of bone ([place])   Osteomyelitis of the right great toe and surrounding cellulitis of the right foot. :  Admitted for IV antibiotics and Dr March Rummage with Podiatry performed incision bone cortex, bone biopsy of the distal and proximal right fifth metatarsal, metatarsectomy, and wound VAC was placed.  He will probably need further debridement as per podiatry. ABI's done, they reveal  right: Resting right ankle-brachial index indicates moderate right lower extremity arterial disease. The right toe-brachial index is abnormal.  Left: Resting left ankle-brachial index indicates moderate left lower extremity arterial disease.  Vascular surgery consulted for abnormal ABIs.  Continue with IV antibiotics and pain control.  Physical therapy and Occupational Therapy evaluations ordered.     ESRD on HD.  Further management as per nephrology.  Underwent  TDC exchange by IR.  07/15/2018   Hypertension:  Well controlled.    NICM:  Pt currently denies any chest pain or sob.    Mild anemia of chronic disease.  Hemoglobin around 10.3   Mild hyperkalemia   DVT prophylaxis: heparin.  Subcu Code Status: full code.  Family Communication: none at bedside.  Disposition Plan: Pending further debridement by podiatry.   Consultants:   Podiatry.  Nephrology  Vascular surgery    Procedures: Metatarsectomy, bone biopsy of the fifth metatarsal wound VAC placement on 07/15/2018 by [name]   Antimicrobials: vancomycin , flagyl and rocephin since admission.    Subjective: No chest pain, shortness of breath or abdominal pain. Pain controlled  Objective: Vitals:   07/16/18 0001 07/16/18 0350 07/16/18 0400 07/16/18 0956  BP: 98/80 (!) 89/55 (!) 96/53 110/62  Pulse: 80 99  95  Resp: 16 16  20   Temp: 97.8 F (36.6 C) 98.9 F (37.2 C)  98.6 F (37 C)  TempSrc: Oral Oral    SpO2: 98% 96%  100%  Weight:      Height:        Intake/Output Summary (Last 24 hours) at 07/16/2018 1036 Last data filed at 07/15/2018 1940 Gross per 24 hour  Intake 590 ml  Output 10 ml  Net 580 ml   Filed Weights   07/14/18 0812 07/14/18 1337 07/15/18 0600  Weight: 81 kg 79.8 kg 83 kg    Examination:  General exam: Alert and comfortable in chair Respiratory system: Clear to auscultation bilaterally Cardiovascular system: S1 & S2 heard, RRR. No JVD,  Gastrointestinal system:  Abdomen is soft, nontender nondistended with good bowel sounds Central nervous system: Alert and oriented. No focal neurological deficits. Extremities: Right foot bandaged wound VAC attached Skin: see above.  Psychiatry: Mood & affect appropriate.     Data Reviewed: I have personally reviewed following labs and imaging studies  CBC: Recent Labs  Lab 07/13/18 1252 07/14/18 0156 07/15/18 1726  WBC 7.3 6.7  --   NEUTROABS 5.7  --   --   HGB 13.4 12.5* 15.3  HCT  43.2 41.5 45.0  MCV 108.0* 106.4*  --   PLT 308 337  --    Basic Metabolic Panel: Recent Labs  Lab 07/13/18 1252 07/14/18 0156 07/15/18 1726  NA 136 139 137  K 5.1 4.7 5.2*  CL 97* 97*  --   CO2 23 24  --   GLUCOSE 104* 93 84  BUN 41* 47*  --   CREATININE 9.49* 9.66*  --   CALCIUM 9.0 8.9  --    GFR: Estimated Creatinine Clearance: 7.3 mL/min (A) (by C-G formula based on SCr of 9.66 mg/dL (H)). Liver Function Tests: Recent Labs  Lab 07/13/18 1252  AST 19  ALT 11  ALKPHOS 56  BILITOT 0.8  PROT 8.1  ALBUMIN 3.1*   No results for input(s): LIPASE, AMYLASE in the last 168 hours. No results for input(s): AMMONIA in the last 168 hours. Coagulation Profile: No results for input(s): INR, PROTIME in the last 168 hours. Cardiac Enzymes: No results for input(s): CKTOTAL, CKMB, CKMBINDEX, TROPONINI in the last 168 hours. BNP (last 3 results) No results for input(s): PROBNP in the last 8760 hours. HbA1C: Recent Labs    07/13/18 1252  HGBA1C 4.7*   CBG: Recent Labs  Lab 07/15/18 1302 07/15/18 1857 07/15/18 1928 07/15/18 2310 07/16/18 0630  GLUCAP 107* 55* 65* 151* 102*   Lipid Profile: No results for input(s): CHOL, HDL, LDLCALC, TRIG, CHOLHDL, LDLDIRECT in the last 72 hours. Thyroid Function Tests: No results for input(s): TSH, T4TOTAL, FREET4, T3FREE, THYROIDAB in the last 72 hours. Anemia Panel: No results for input(s): VITAMINB12, FOLATE, FERRITIN, TIBC, IRON, RETICCTPCT in the last 72 hours. Sepsis Labs: Recent Labs  Lab 07/13/18 1331 07/13/18 1503 07/13/18 1819  LATICACIDVEN 2.75* 2.33* 2.58*    Recent Results (from the past 240 hour(s))  Blood culture (routine x 2)     Status: None (Preliminary result)   Collection Time: 07/13/18 12:52 PM  Result Value Ref Range Status   Specimen Description BLOOD RIGHT HAND  Final   Special Requests   Final    BOTTLES DRAWN AEROBIC ONLY Blood Culture results may not be optimal due to an inadequate volume of blood  received in culture bottles   Culture   Final    NO GROWTH 3 DAYS Performed at [facility], [place] [address] [place], [place] [address]    Report Status PENDING  Incomplete  Blood culture (routine x 2)     Status: None (Preliminary result)   Collection Time: 07/13/18 12:57 PM  Result Value Ref Range Status   Specimen Description BLOOD BLOOD RIGHT FOREARM  Final   Special Requests   Final    BOTTLES DRAWN AEROBIC AND ANAEROBIC Blood Culture results may not be optimal due to an inadequate volume of blood received in culture bottles   Culture   Final    NO GROWTH 3 DAYS Performed at [facility], [place] [address] [place], [place] [address]    Report Status PENDING  Incomplete  WOUND CULTURE     Status: None (Preliminary result)   Collection Time: 07/13/18  1:24 PM  Result Value Ref Range Status   MICRO NUMBER: [id number]  Preliminary   SPECIMEN QUALITY: Adequate  Preliminary   SOURCE: NOT GIVEN  Preliminary   STATUS: PRELIMINARY  Preliminary   GRAM STAIN:   Preliminary    No white blood cells seen No epithelial cells seen Many Gram positive cocci in pairs Many Gram negative bacilli   RESULT: Culture in progress  Preliminary  Surgical pcr screen     Status: None   Collection Time: 07/13/18  9:11 PM  Result Value Ref Range Status   MRSA, PCR NEGATIVE NEGATIVE Final   Staphylococcus aureus NEGATIVE NEGATIVE Final    Comment: (NOTE) The Xpert SA Assay (FDA approved for NASAL specimens in patients 52 years of age and older), is one component of a comprehensive surveillance program. It is not intended to diagnose infection nor to guide or monitor treatment. Performed at [facility], [place] [address], [place], [place] [address]   Aerobic/Anaerobic Culture (surgical/deep wound)     Status: None (Preliminary result)   Collection Time: 07/15/18  6:47 PM  Result Value Ref Range Status   Specimen Description BONE TISSUE  Final   Special Requests METATARSAL BONE FROM RIGHT  FOOT  Final   Gram Stain   Final    MODERATE WBC PRESENT,BOTH PMN AND MONONUCLEAR NO ORGANISMS SEEN Performed at [facility], [place] [address], [place], [place] [address]    Culture PENDING  Incomplete   Report Status PENDING  Incomplete  Aerobic/Anaerobic Culture (surgical/deep wound)     Status: None (Preliminary result)   Collection Time: 07/15/18  6:50 PM  Result Value Ref Range Status   Specimen Description TISSUE  Final   Special Requests SOFT TISSUE RIGHT FOOT  Final   Gram Stain   Final    FEW WBC PRESENT,BOTH PMN AND MONONUCLEAR RARE GRAM NEGATIVE RODS Performed at [facility], [place] [address] [place], [place] [address]    Culture PENDING  Incomplete   Report Status PENDING  Incomplete         Radiology Studies: Ir Fluoro Guide Cv Line Left  Result Date: 07/15/2018 INDICATION: Continued poor function of tunneled left IJ hemodialysis catheter, post revision x 2. EXAM: TUNNELED CENTRAL VENOUS HEMODIALYSIS CATHETER REVISION WITH FLUOROSCOPIC GUIDANCE VENOUS ANGIOPLASTY OF CENTRAL VENOUS STENOSIS MEDICATIONS: patient is already on adequate prophylactic antibiotic coverage ANESTHESIA/SEDATION: Intravenous Fentanyl and Versed were administered as conscious sedation during continuous monitoring of the patient's level of consciousness and physiological / cardiorespiratory status by the radiology RN, with a total moderate sedation time of 18 minutes. FLUOROSCOPY TIME:  3.2 minute; 202  uGym2 DAP COMPLICATIONS: None immediate. PROCEDURE: Informed written consent was obtained from the patient after a discussion of the risks, benefits, and alternatives to treatment. Questions regarding the procedure were encouraged and answered. The left neck, catheter, and chest were prepped with chlorhexidine in a sterile fashion, and a sterile drape was applied covering the operative field. Maximum barrier sterile technique with sterile gowns and gloves were used for the procedure. A timeout  was performed prior to the initiation of the procedure. The subcutaneous tract of the left IJ tunneled catheter was infiltrated with 1% lidocaine. The catheter was partially withdrawn and central venography was performed through the blue venous return lumen of the catheter. This demonstrated moderate smooth stenosis in the central aspect of the left  innominate vein extending into the proximal SVC. Some eccentric fibrin sheath/thrombus at the site of the prior catheter tip at the cavoatrial junction. Distal SVC is patent with good flow into the right atrium, minimal reflux into the IVC. Catheter was exchanged over a Bentson wire for a 5 [place] Kumpe catheter, which allowed access to the IVC. Catheter exchanged for a 12 mm x 4 cm atlas angioplasty balloon, advanced into distal left innominate vein and proximal SVC for overlapping venous angioplasty at 18 atmospheres. The balloon catheter was then advanced into the IVC and exchanged over a stiff Amplatz Glidewire for a new 28 cm Palindrome tunneled hemodialysis catheter. This was advanced to the mid right atrium. Both lumens aspirated and flushed easily. Good catheter position confirmed on chest rate fluoroscopy, documentation stored. The catheter was secured externally with 0 Prolene suture in flushed per protocol. Dressings were applied. The patient tolerated the procedure well without immediate post procedural complication. IMPRESSION: 1. 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 2. The lesions responded well  to 12 mm balloon angioplasty. 3. Technically successful exchange and revision of tunneled left IJ hemodialysis catheter, a 28 cm Palindrome HD catheter advanced to the right atrium. Electronically Signed   By: Lucrezia Europe M.D.   On: 07/15/2018 17:22   Ir US Guide Vasc Access  Left  Result Date: 07/15/2018 INDICATION: Continued poor function of tunneled left IJ hemodialysis catheter, post revision x 2. EXAM: TUNNELED CENTRAL VENOUS HEMODIALYSIS CATHETER REVISION WITH FLUOROSCOPIC GUIDANCE VENOUS ANGIOPLASTY OF CENTRAL VENOUS STENOSIS MEDICATIONS: patient is already on adequate prophylactic antibiotic coverage ANESTHESIA/SEDATION: Intravenous Fentanyl and Versed were administered as conscious sedation during continuous monitoring of the patient's level of consciousness and physiological / cardiorespiratory status by the radiology RN, with a total moderate sedation time of 18 minutes. FLUOROSCOPY TIME:  3.2 minute; 101  uGym2 DAP COMPLICATIONS: None immediate. PROCEDURE: Informed written consent was obtained from the patient after a discussion of the risks, benefits, and alternatives to treatment. Questions regarding the procedure were encouraged and answered. The left neck, catheter, and chest were prepped with chlorhexidine in a sterile fashion, and a sterile drape was applied covering the operative field. Maximum barrier sterile technique with sterile gowns and gloves were used for the procedure. A timeout was performed prior to the initiation of the procedure. The subcutaneous tract of the left IJ tunneled catheter was infiltrated with 1% lidocaine. The catheter was partially withdrawn and central venography was performed through the blue venous return lumen of the catheter. 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 Some eccentric fibrin sheath/thrombus at the site of the prior catheter tip at the cavoatrial junction. Distal SVC is patent with good flow into the right atrium, minimal reflux into the IVC. Catheter was exchanged over a Bentson wire for a 5 [place] Kumpe catheter, which allowed access to the IVC. Catheter exchanged for a 12 mm x 4 cm atlas angioplasty balloon, advanced into distal left  innominate vein and proximal SVC for overlapping venous angioplasty at 18 atmospheres. The balloon catheter was then advanced into the IVC and exchanged over a stiff Amplatz Glidewire for a new 28 cm Palindrome tunneled hemodialysis catheter. This was advanced to the mid right atrium. Both lumens aspirated and flushed easily. Good catheter position  confirmed on chest rate fluoroscopy, documentation stored. The catheter was secured externally with 0 Prolene suture in flushed per protocol. Dressings were applied. The patient tolerated the procedure well without immediate post procedural complication. IMPRESSION: 1. 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 2. The lesions responded well  to 12 mm balloon angioplasty. 3. Technically successful exchange and revision of tunneled left IJ hemodialysis catheter, a 28 cm Palindrome HD catheter advanced to the right atrium. Electronically Signed   By: Lucrezia Europe M.D.   On: 07/15/2018 17:22   Dg Foot 2 Views Right  Result Date: 07/15/2018 CLINICAL DATA:  Postop irrigation and debridement. Fifth metatarsal resection. History of diabetes. EXAM: RIGHT FOOT - 2 VIEW COMPARISON:  Preop study from 07/13/2018 FINDINGS: The patient has undergone amputation of the distal half of the right fifth metatarsal with postsurgical change involving the soft tissues of the lateral forefoot. Vacuum device projects over the lateral aspect of the forefoot at site of amputation. Redemonstration of comminuted intra-articular fracture involving the head of the first proximal phalanx. Osteoarthritis of the toes otherwise noted. No additional new findings. IMPRESSION: 1. 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 2. Intra-articular fracture of the first proximal phalangeal  head as before with comminution. No significant change. Electronically Signed   By: Ashley Royalty M.D.   On: 07/15/2018 19:32   Nm Bone Scan 3 Phase Lower Extremity  Result Date: 07/15/2018 CLINICAL DATA:  Soft tissue ulceration adjacent to the fifth metatarsal. Osteomyelitis suggested on radiograph. EXAM: NUCLEAR MEDICINE 3-PHASE BONE SCAN TECHNIQUE: Radionuclide angiographic images, immediate static blood pool images, and 3-hour delayed static images were obtained of the feet after intravenous injection of radiopharmaceutical. RADIOPHARMACEUTICALS:  21.9 mCi Tc-26m MDP IV COMPARISON:  Radiograph 07/13/2018 FINDINGS: Vascular phase: Mild diffuse hyperemia to the RIGHT foot compared to the LEFT. Blood pool phase: Diffuse increased blood pool activity in the RIGHT foot compared to the LEFT. Some focality in the great toe on the RIGHT. Focality also the head of the fifth metacarpal Delayed phase: Focal activity in the proximal phalanx of the first digit and distal fifth metacarpal. IMPRESSION: Focal delayed activity in the proximal phalanx of the first digit and distal aspect of the fifth metatarsal of the RIGHT foot. With comparison to radiograph 07/13/2018, the proximal phalanx first digit uptake is favored posttraumatic. Uptake at the distal aspect of the fifth metatarsal is favored osteomyelitis. Electronically Signed   By: Suzy Bouchard M.D.   On: 07/15/2018 12:44   Ir Pta Venous Except Dialysis Circuit  Result Date: 07/15/2018 INDICATION: Continued poor function of tunneled left IJ hemodialysis catheter, post revision x 2. EXAM: TUNNELED CENTRAL VENOUS HEMODIALYSIS CATHETER REVISION WITH FLUOROSCOPIC GUIDANCE VENOUS ANGIOPLASTY OF CENTRAL VENOUS STENOSIS MEDICATIONS: patient is already on adequate prophylactic antibiotic coverage ANESTHESIA/SEDATION: Intravenous Fentanyl and Versed were administered as conscious sedation during continuous monitoring of the patient's level of consciousness and  physiological / cardiorespiratory status by the radiology RN, with a total moderate sedation time of 18 minutes. FLUOROSCOPY TIME:  3.2 minute; 063  uGym2 DAP COMPLICATIONS: None immediate. PROCEDURE: Informed written consent was obtained from the patient after a discussion of the risks, benefits, and alternatives to treatment. Questions regarding the procedure were encouraged and answered. The left neck, catheter, and chest were  prepped with chlorhexidine in a sterile fashion, and a sterile drape was applied covering the operative field. Maximum barrier sterile technique with sterile gowns and gloves were used for the procedure. A timeout was performed prior to the initiation of the procedure. The subcutaneous tract of the left IJ tunneled catheter was infiltrated with 1% lidocaine. The catheter was partially withdrawn and central venography was performed through the blue venous return lumen of the catheter. 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 Some eccentric fibrin sheath/thrombus at the site of the prior catheter tip at the cavoatrial junction. Distal SVC is patent with good flow into the right atrium, minimal reflux into the IVC. Catheter was exchanged over a Bentson wire for a 5 [place] Kumpe catheter, which allowed access to the IVC. Catheter exchanged for a 12 mm x 4 cm atlas angioplasty balloon, advanced into distal left innominate vein and proximal SVC for overlapping venous angioplasty at 18 atmospheres. The balloon catheter was then advanced into the IVC and exchanged over a stiff Amplatz Glidewire for a new 28 cm Palindrome tunneled hemodialysis catheter. This was advanced to the mid right atrium. Both lumens aspirated and flushed easily. Good catheter position confirmed on chest rate fluoroscopy, documentation stored. The catheter was secured externally with 0 Prolene suture in flushed per protocol. Dressings were  applied. The patient tolerated the procedure well without immediate post procedural complication. IMPRESSION: 1. 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 2. The lesions responded well  to 12 mm balloon angioplasty. 3. Technically successful exchange and revision of tunneled left IJ hemodialysis catheter, a 28 cm Palindrome HD catheter advanced to the right atrium. Electronically Signed   By: Lucrezia Europe M.D.   On: 07/15/2018 17:22   Vas [place] Burnard Bunting With/wo Tbi  Result Date: 07/14/2018 LOWER EXTREMITY DOPPLER STUDY Indications: Ulceration. High Risk Factors: Diabetes. Other Factors: Renal Dialysis Pt with lt fistula.  Performing Technologist: Birdena Crandall, [place] [facility]  Examination Guidelines: A complete evaluation includes at minimum, Doppler waveform signals and systolic blood pressure reading at the level of bilateral brachial, anterior tibial, and posterior tibial arteries, when vessel segments are accessible. Bilateral testing is considered an integral part of a complete examination. Photoelectric Plethysmograph (PPG) waveforms and toe systolic pressure readings are included as required and additional duplex testing as needed. Limited examinations for reoccurring indications may be performed as noted.  ABI Findings: +---------+------------------+-----+-------------------+--------+ Right    Rt Pressure (mmHg)IndexWaveform           Comment  +---------+------------------+-----+-------------------+--------+ Brachial 157                    triphasic                   +---------+------------------+-----+-------------------+--------+ PTA      97                0.62 dampened monophasic         +---------+------------------+-----+-------------------+--------+ DP       94                0.60 dampened monophasic          +---------+------------------+-----+-------------------+--------+ Great Toe25                0.16                             +---------+------------------+-----+-------------------+--------+ +---------+------------------+-----+----------+----------------------------+  Left     Lt Pressure (mmHg)IndexWaveform  Comment                      +---------+------------------+-----+----------+----------------------------+ Brachial                                  Resyructed limb wuth fistula +---------+------------------+-----+----------+----------------------------+ PTA      79                0.50 monophasic                             +---------+------------------+-----+----------+----------------------------+ DP       91                0.58 monophasic                             +---------+------------------+-----+----------+----------------------------+ Great Toe255               1.62           Non compressible             +---------+------------------+-----+----------+----------------------------+ +-------+-----------+---------------+------------+------------+ ABI/TBIToday's ABIToday's TBI    Previous ABIPrevious TBI +-------+-----------+---------------+------------+------------+ Right  0.62       0.16                                    +-------+-----------+---------------+------------+------------+ Left   0.58       Noncompressible                         +-------+-----------+---------------+------------+------------+  Summary: Right: Resting right ankle-brachial index indicates moderate right lower extremity arterial disease. The right toe-brachial index is abnormal. Left: Resting left ankle-brachial index indicates moderate left lower extremity arterial disease. TBIs are unreliable.  *See table(s) above for measurements and observations.  Electronically signed by Quay Burow MD on 07/14/2018 at 4:46:20 PM.    Final         Scheduled Meds: . aspirin EC   81 mg Oral Daily  . Chlorhexidine Gluconate Cloth  6 each Topical Q0600  . cinacalcet  30 mg Oral Q breakfast  . feeding supplement (NEPRO CARB STEADY)  237 mL Oral Q24H  . feeding supplement (PRO-STAT SUGAR FREE 64)  30 mL Oral BID WC  . ferric citrate  630 mg Oral TID WC  . gabapentin  100 mg Oral QHS  . insulin aspart  0-15 Units Subcutaneous TID WC  . insulin aspart  0-5 Units Subcutaneous QHS  . lidocaine  20 mL Infiltration Once  . midodrine  10 mg Oral Once per day on Mon Wed Fri  . multivitamin  1 tablet Oral QHS   Continuous Infusions: . sodium chloride 10 mL/hr at 07/15/18 1721  . cefTRIAXone (ROCEPHIN)  IV Stopped (07/13/18 1905)  . metronidazole 500 mg (07/16/18 0848)  . vancomycin 750 mg (07/14/18 1126)     LOS: 3 days    Time spent: 30 minutes.     [place] Poisson, MD [facility] Pager [phone]   If 7PM-7AM, please contact night-coverage [email or web address] Password [email or web address] 07/16/2018, 10:36 AM

## 2018-07-17 ENCOUNTER — Inpatient Hospital Stay (HOSPITAL_COMMUNITY): Payer: Medicare Other | Admitting: Anesthesiology

## 2018-07-17 ENCOUNTER — Encounter (HOSPITAL_COMMUNITY)
Admission: EM | Disposition: A | Discharge status: Home Health | Payer: Self-pay | Source: Home / Self Care | Attending: Internal Medicine

## 2018-07-17 ENCOUNTER — Inpatient Hospital Stay (HOSPITAL_COMMUNITY): Payer: Medicare Other

## 2018-07-17 DIAGNOSIS — T82598A Other mechanical complication of other cardiac and vascular devices and implants, initial encounter: Secondary | ICD-10-CM

## 2018-07-17 DIAGNOSIS — Z481 Encounter for planned postprocedural wound closure: Secondary | ICD-10-CM

## 2018-07-17 DIAGNOSIS — L97414 Non-pressure chronic ulcer of right heel and midfoot with necrosis of bone: Secondary | ICD-10-CM

## 2018-07-17 DIAGNOSIS — M86072 Acute hematogenous osteomyelitis, left ankle and foot: Secondary | ICD-10-CM

## 2018-07-17 HISTORY — PX: WOUND DEBRIDEMENT: SHX247

## 2018-07-17 LAB — GLUCOSE, CAPILLARY
Glucose-Capillary: 78 mg/dL (ref 70–99)
Glucose-Capillary: 72 mg/dL (ref 70–99)
Glucose-Capillary: 104 mg/dL — ABNORMAL HIGH (ref 70–99)
Glucose-Capillary: 122 mg/dL — ABNORMAL HIGH (ref 70–99)
Glucose-Capillary: 106 mg/dL — ABNORMAL HIGH (ref 70–99)
Glucose-Capillary: 178 mg/dL — ABNORMAL HIGH (ref 70–99)

## 2018-07-17 LAB — WOUND CULTURE
MICRO NUMBER:: 91446167
SPECIMEN QUALITY: ADEQUATE

## 2018-07-17 SURGERY — DEBRIDEMENT, WOUND
Anesthesia: Monitor Anesthesia Care | Site: Foot | Laterality: Right

## 2018-07-17 MED ORDER — FENTANYL CITRATE (PF) 100 MCG/2ML IJ SOLN
INTRAMUSCULAR | Status: DC | PRN
  Administered 2018-07-17: 50 ug via INTRAVENOUS

## 2018-07-17 MED ORDER — PHENYLEPHRINE HCL 10 MG/ML IJ SOLN
INTRAMUSCULAR | Status: DC | PRN
  Administered 2018-07-17: 120 ug via INTRAVENOUS

## 2018-07-17 MED ORDER — DEXTROSE 50 % IV SOLN
INTRAVENOUS | Status: AC
  Administered 2018-07-17: 50 mL
  Filled 2018-07-17: qty 50

## 2018-07-17 MED ORDER — ONDANSETRON HCL 4 MG/2ML IJ SOLN
INTRAMUSCULAR | Status: DC | PRN
  Administered 2018-07-17: 4 mg via INTRAVENOUS

## 2018-07-17 MED ORDER — SODIUM CHLORIDE 0.9 % IR SOLN
Status: DC | PRN
  Administered 2018-07-17: 3000 mL

## 2018-07-17 MED ORDER — BUPIVACAINE HCL 0.5 % IJ SOLN
INTRAMUSCULAR | Status: DC | PRN
  Administered 2018-07-17: 10 mL

## 2018-07-17 MED ORDER — PROPOFOL 10 MG/ML IV BOLUS
INTRAVENOUS | Status: AC
  Filled 2018-07-17: qty 20

## 2018-07-17 MED ORDER — 0.9 % SODIUM CHLORIDE (POUR BTL) OPTIME
TOPICAL | Status: DC | PRN
  Administered 2018-07-17: 1000 mL

## 2018-07-17 MED ORDER — BUPIVACAINE HCL (PF) 0.5 % IJ SOLN
INTRAMUSCULAR | Status: AC
  Filled 2018-07-17: qty 30

## 2018-07-17 MED ORDER — SODIUM CHLORIDE 0.9 % IV SOLN
INTRAVENOUS | Status: DC | PRN
  Administered 2018-07-17: 50 ug/min via INTRAVENOUS

## 2018-07-17 MED ORDER — FENTANYL CITRATE (PF) 100 MCG/2ML IJ SOLN: 25.0000 ug | INTRAMUSCULAR | Status: DC | PRN

## 2018-07-17 MED ORDER — SODIUM CHLORIDE 0.9 % IV SOLN
INTRAVENOUS | Status: DC | PRN
  Administered 2018-07-17: 07:00:00 via INTRAVENOUS

## 2018-07-17 MED ORDER — PHENYLEPHRINE 40 MCG/ML (10ML) SYRINGE FOR IV PUSH (FOR BLOOD PRESSURE SUPPORT)
PREFILLED_SYRINGE | INTRAVENOUS | Status: AC
  Filled 2018-07-17: qty 10

## 2018-07-17 MED ORDER — VANCOMYCIN HCL 1000 MG IV SOLR
INTRAVENOUS | Status: DC | PRN
  Administered 2018-07-17: 1000 mg via TOPICAL

## 2018-07-17 MED ORDER — ALBUMIN HUMAN 5 % IV SOLN
INTRAVENOUS | Status: AC
  Administered 2018-07-17: 12.5 g via INTRAVENOUS
  Filled 2018-07-17: qty 250

## 2018-07-17 MED ORDER — PROPOFOL 500 MG/50ML IV EMUL
INTRAVENOUS | Status: DC | PRN
  Administered 2018-07-17: 25 ug/kg/min via INTRAVENOUS

## 2018-07-17 MED ORDER — FENTANYL CITRATE (PF) 250 MCG/5ML IJ SOLN
INTRAMUSCULAR | Status: AC
  Filled 2018-07-17: qty 5

## 2018-07-17 MED ORDER — EPHEDRINE 5 MG/ML INJ
INTRAVENOUS | Status: AC
  Administered 2018-07-17: 10 mg
  Filled 2018-07-17: qty 10

## 2018-07-17 MED ORDER — OXYCODONE HCL 5 MG PO TABS: 5.0000 mg | ORAL_TABLET | Freq: Once | ORAL | Status: DC | PRN

## 2018-07-17 MED ORDER — VANCOMYCIN HCL 1000 MG IV SOLR
INTRAVENOUS | Status: AC
  Filled 2018-07-17: qty 1000

## 2018-07-17 MED ORDER — OXYCODONE HCL 5 MG/5ML PO SOLN: 5.0000 mg | Freq: Once | ORAL | Status: DC | PRN

## 2018-07-17 MED ORDER — ALBUMIN HUMAN 5 % IV SOLN
12.5000 g | Freq: Once | INTRAVENOUS | Status: AC
  Administered 2018-07-17: 12.5 g via INTRAVENOUS

## 2018-07-17 MED ORDER — EPHEDRINE SULFATE 50 MG/ML IJ SOLN: 10.0000 mg | Freq: Once | INTRAMUSCULAR | Status: DC

## 2018-07-17 MED ORDER — MIDAZOLAM HCL 2 MG/2ML IJ SOLN
INTRAMUSCULAR | Status: AC
  Filled 2018-07-17: qty 2

## 2018-07-17 MED ORDER — ONDANSETRON HCL 4 MG/2ML IJ SOLN: 4.0000 mg | Freq: Once | INTRAMUSCULAR | Status: DC | PRN

## 2018-07-17 MED ORDER — ONDANSETRON HCL 4 MG/2ML IJ SOLN
INTRAMUSCULAR | Status: AC
  Filled 2018-07-17: qty 2

## 2018-07-17 SURGICAL SUPPLY — 34 items
AGENT HMST PWDR HDRLZ BVN CLGN (Miscellaneous) ×1 IMPLANT
BANDAGE ACE 4X5 VEL STRL LF (GAUZE/BANDAGES/DRESSINGS) ×2 IMPLANT
BLADE SURG 15 STRL LF DISP TIS (BLADE) IMPLANT
BLADE SURG 15 STRL SS (BLADE) ×3
CANISTER WOUND CARE 500ML ATS (WOUND CARE) ×2 IMPLANT
COLLAGEN CELLERATERX 1 GRAM (Miscellaneous) ×2 IMPLANT
CONT SPEC 4OZ CLIKSEAL STRL BL (MISCELLANEOUS) ×4 IMPLANT
COVER SURGICAL LIGHT HANDLE (MISCELLANEOUS) ×3 IMPLANT
COVER WAND RF STERILE (DRAPES) ×3 IMPLANT
DRSG VAC ATS MED SENSATRAC (GAUZE/BANDAGES/DRESSINGS) ×2 IMPLANT
GAUZE XEROFORM 5X9 LF (GAUZE/BANDAGES/DRESSINGS) ×2 IMPLANT
GLOVE BIO SURGEON STRL SZ7.5 (GLOVE) ×3 IMPLANT
GLOVE BIOGEL PI IND STRL 8 (GLOVE) ×1 IMPLANT
GLOVE BIOGEL PI INDICATOR 8 (GLOVE) ×2
GOWN STRL REUS W/ TWL XL LVL3 (GOWN DISPOSABLE) ×1 IMPLANT
GOWN STRL REUS W/TWL XL LVL3 (GOWN DISPOSABLE) ×6
HANDPIECE INTERPULSE COAX TIP (DISPOSABLE) ×3
KIT BASIN OR (CUSTOM PROCEDURE TRAY) ×3 IMPLANT
KIT TURNOVER KIT B (KITS) ×3 IMPLANT
NDL HYPO 25GX1X1/2 BEV (NEEDLE) ×1 IMPLANT
NEEDLE HYPO 25GX1X1/2 BEV (NEEDLE) ×3 IMPLANT
NS IRRIG 1000ML POUR BTL (IV SOLUTION) ×3 IMPLANT
PACK ORTHO EXTREMITY (CUSTOM PROCEDURE TRAY) ×3 IMPLANT
PAD CAST 3X4 CTTN HI CHSV (CAST SUPPLIES) IMPLANT
PADDING CAST COTTON 3X4 STRL (CAST SUPPLIES) ×3
SET HNDPC FAN SPRY TIP SCT (DISPOSABLE) IMPLANT
SOL PREP POV-IOD 4OZ 10% (MISCELLANEOUS) ×3 IMPLANT
STAPLER VISISTAT 35W (STAPLE) ×2 IMPLANT
SUT ETHILON 3 0 PS 1 (SUTURE) ×5 IMPLANT
SYR CONTROL 10ML LL (SYRINGE) ×3 IMPLANT
TOWEL OR 17X26 10 PK STRL BLUE (TOWEL DISPOSABLE) ×3 IMPLANT
TUBE CONNECTING 12'X1/4 (SUCTIONS) ×1
TUBE CONNECTING 12X1/4 (SUCTIONS) ×2 IMPLANT
YANKAUER SUCT BULB TIP NO VENT (SUCTIONS) ×3 IMPLANT

## 2018-07-17 NOTE — Progress Notes (Signed)
Discussed with patient plans for right leg angiogram and left arm fistulogram on Monday or Tuesday, depending on availability in the cath lab.  Will order fistula duplex today. NPO after midnight on Sunday night.  Annamarie Major

## 2018-07-17 NOTE — Brief Op Note (Signed)
07/17/2018  8:22 AM  PATIENT:  Timothy Faulkner  72 y.o. male  PRE-OPERATIVE DIAGNOSIS:  Right Foot Wound Infection  POST-OPERATIVE DIAGNOSIS:  Right Foot Wound Infection  PROCEDURE:  Procedure(s): Wound Debridement Possible Closure Filleted toe flap Right Foot (Right)  SURGEON:  Surgeon(s) and Role:    * [name], DPM - Primary  PHYSICIAN ASSISTANT:   ASSISTANTS: none   ANESTHESIA:   local and MAC  EBL:  3 ml   BLOOD ADMINISTERED:none  DRAINS: none   LOCAL MEDICATIONS USED:  MARCAINE    and Amount: 10 ml  SPECIMEN:   ID Type Source Tests Collected by Time Destination  1 : Right Foot Bone Biopsy Tissue [facility] Bone biopsy SURGICAL PATHOLOGY [name], DPM 07/17/2018 0800   2 : 5th Toe Amputation Amputation Toe, Right SURGICAL PATHOLOGY [name], DPM 07/17/2018 0803       DISPOSITION OF SPECIMEN:  PATHOLOGY  COUNTS:  YES  TOURNIQUET:  * No tourniquets in log *  DICTATION: .Note written in EPIC  PLAN OF CARE: Transfer to floor  PATIENT DISPOSITION:  PACU - hemodynamically stable.   Delay start of Pharmacological VTE agent (>24hrs) due to surgical blood loss or risk of bleeding: not applicable

## 2018-07-17 NOTE — Op Note (Signed)
Patient Name: TORREY BALLINAS DOB: 1946-03-06  MRN: [id number]   Date of Service: 07/17/18    Surgeon: Dr. 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 Post-operative Diagnosis: Same Procedures:             1) Bone Biopsy Left 5th Phalanx  2) Delayed Wound Closure - Complex  3) Filleted Toe Flap Pathology/Specimens: ID Type Source Tests Collected by Time Destination  1 : Right Foot Bone Biopsy Tissue [facility] Bone biopsy SURGICAL PATHOLOGY [name], DPM 07/17/2018 0800   2 : 5th Toe Amputation Amputation Toe, Right SURGICAL PATHOLOGY [name], DPM 07/17/2018 0803    Anesthesia: MAC/local Hemostasis: Anatomic Estimated Blood Loss: 5 ml Materials: None Medications: 1g Vancomycin powder, collagen powder Complications: None  Indications for Procedure:  This is a 72 y.o. male with a wound to the right foot who presented with ostoemyelitis.  Appears he underwent metatarsal resection   Procedure in Detail: Patient was identified in pre-operative holding area. Formal consent was signed and the right lower extremity was marked. Patient was brought back to the operating room and placed on the operating room table in the supine position. Anesthesia was induced.   The extremity was prepped and draped in the usual sterile fashion. Timeout was taken to confirm patient name, laterality, and procedure prior to incision. Attention was then directed to the right foot where a large wound s/p debridement was noted measuring 4x5 cm. The wound was dry without much bleeding tissue but not necrotic appearing. The wound was thoroughly irrigated with 2L NS via pulse lavage. Again minimal bleeding was noted.  Attention was directed to the 5th toe. The tendons to the toe had been previously debrided leaving a flail digit. The proximal extent of the proximal phalanx appeared devitalized and grey but the overall toe appeared healthy with good  viable skin. A rongeur was used to biopsy the bone to evaluate for osteomyelitis.  Since there was a large skin deficit without ability of the skin edges to be coapted, filleted toe flap was indicated. Incision was made about the lateral aspect of the toe.  Full-thickness flap was raised from the toe and the bones were gently dissected away from the underlying soft tissue.  The skin of the toe was transferred proximally to cover the deficit and sutured with 3-0 nylon.  Remaining wound edges proximally were additionally sutured with 3-0 nylon and skin staples.  Following flap closure the deficit reduced to only 2x2.5 cm.  Wound VAC was then indicated for the small residual deficit  The incisions were then dressed with xeroform.  A black sponge wound VAC sponge was applied to the wound and adhered with the transparent dressing and VAC track pad.  This was set to suction at 125 with good seal noted.   Disposition: Following a period of post-operative monitoring, patient will be transferred back to the floor.

## 2018-07-17 NOTE — Progress Notes (Signed)
LUE duplex HD access       has been completed. Preliminary results can be found under CV proc through chart review. Landry Mellow, RDMS, RVT

## 2018-07-17 NOTE — Progress Notes (Signed)
Blood sugar 78, pt. Is asymptomatic, denies complaints. NPO. 1/2 amp D50 given per protocol.

## 2018-07-17 NOTE — Progress Notes (Signed)
Subjective:  Patient ID: Timothy Faulkner, male    DOB: 07/04/1946,  MRN: [id number]  Seen in pre-op. Understands plan for OR today. No complaints  Objective:   Vitals:   07/16/18 2307 07/17/18 0445  BP: 98/63 (!) 90/38  Pulse: (!) 103   Resp: 18 14  Temp: 97.7 F (36.5 C) 99.5 F (37.5 C)  SpO2: 95%    General AA&O x3. Normal mood and affect.  Vascular Right foot warm Brisk capillary refill to all digits. Pedal hair present.  Neurologic Epicritic sensation grossly intact.  Dermatologic Wound Vac on and functioning with ss drainage in cannister. No evidence of seal loss. No proximal warmth erythema or streaking.  Orthopedic: MMT 5/5 in dorsiflexion, plantarflexion, inversion, and eversion. Normal joint ROM without pain or crepitus.   Results for orders placed or performed during the hospital encounter of 07/13/18 (from the past 24 hour(s))  Glucose, capillary     Status: Abnormal   Collection Time: 07/16/18 11:27 AM  Result Value Ref Range   Glucose-Capillary 132 (H) 70 - 99 mg/dL  Glucose, capillary     Status: Abnormal   Collection Time: 07/16/18  4:30 PM  Result Value Ref Range   Glucose-Capillary 121 (H) 70 - 99 mg/dL  CBC     Status: Abnormal   Collection Time: 07/16/18  7:11 PM  Result Value Ref Range   WBC 9.8 4.0 - 10.5 K/uL   RBC 3.66 (L) 4.22 - 5.81 MIL/uL   Hemoglobin 11.8 (L) 13.0 - 17.0 g/dL   HCT 38.8 (L) 39.0 - 52.0 %   MCV 106.0 (H) 80.0 - 100.0 fL   MCH 32.2 26.0 - 34.0 pg   MCHC 30.4 30.0 - 36.0 g/dL   RDW 16.0 (H) 11.5 - 15.5 %   Platelets 224 150 - 400 K/uL   nRBC 0.0 0.0 - 0.2 %  Renal function panel     Status: Abnormal   Collection Time: 07/16/18  7:11 PM  Result Value Ref Range   Sodium 136 135 - 145 mmol/L   Potassium 4.7 3.5 - 5.1 mmol/L   Chloride 97 (L) 98 - 111 mmol/L   CO2 21 (L) 22 - 32 mmol/L   Glucose, Bld 144 (H) 70 - 99 mg/dL   BUN 60 (H) 8 - 23 mg/dL   Creatinine, Ser 11.74 (H) 0.61 - 1.24 mg/dL   Calcium 8.3 (L) 8.9 - 10.3  mg/dL   Phosphorus 7.2 (H) 2.5 - 4.6 mg/dL   Albumin 2.6 (L) 3.5 - 5.0 g/dL   GFR calc non Af Amer 4 (L) >60 mL/min   GFR calc Af Amer 4 (L) >60 mL/min   Anion gap 18 (H) 5 - 15  Glucose, capillary     Status: None   Collection Time: 07/17/18  4:47 AM  Result Value Ref Range   Glucose-Capillary 78 70 - 99 mg/dL   Results for orders placed or performed during the hospital encounter of 07/13/18  Blood culture (routine x 2)     Status: None (Preliminary result)   Collection Time: 07/13/18 12:52 PM  Result Value Ref Range Status   Specimen Description BLOOD RIGHT HAND  Final   Special Requests   Final    BOTTLES DRAWN AEROBIC ONLY Blood Culture results may not be optimal due to an inadequate volume of blood received in culture bottles   Culture   Final    NO GROWTH 3 DAYS Performed at [facility], [address],  [place], [place] [address]    Report Status PENDING  Incomplete  Blood culture (routine x 2)     Status: None (Preliminary result)   Collection Time: 07/13/18 12:57 PM  Result Value Ref Range Status   Specimen Description BLOOD BLOOD RIGHT FOREARM  Final   Special Requests   Final    BOTTLES DRAWN AEROBIC AND ANAEROBIC Blood Culture results may not be optimal due to an inadequate volume of blood received in culture bottles   Culture   Final    NO GROWTH 3 DAYS Performed at [facility], [place] [address], [place], [place] [address]    Report Status PENDING  Incomplete  Surgical pcr screen     Status: None   Collection Time: 07/13/18  9:11 PM  Result Value Ref Range Status   MRSA, PCR NEGATIVE NEGATIVE Final   Staphylococcus aureus NEGATIVE NEGATIVE Final    Comment: (NOTE) The Xpert SA Assay (FDA approved for NASAL specimens in patients 67 years of age and older), is one component of a comprehensive surveillance program. It is not intended to diagnose infection nor to guide or monitor treatment. Performed at [facility], [place] [address],  [place], [place] [address]   Aerobic/Anaerobic Culture (surgical/deep wound)     Status: None (Preliminary result)   Collection Time: 07/15/18  6:47 PM  Result Value Ref Range Status   Specimen Description BONE TISSUE  Final   Special Requests METATARSAL BONE FROM RIGHT FOOT  Final   Gram Stain   Final    MODERATE WBC PRESENT,BOTH PMN AND MONONUCLEAR NO ORGANISMS SEEN    Culture   Final    CULTURE REINCUBATED FOR BETTER GROWTH Performed at [facility], [place] [address] [place], [place] [address]    Report Status PENDING  Incomplete  Aerobic/Anaerobic Culture (surgical/deep wound)     Status: None (Preliminary result)   Collection Time: 07/15/18  6:50 PM  Result Value Ref Range Status   Specimen Description TISSUE  Final   Special Requests SOFT TISSUE RIGHT FOOT  Final   Gram Stain   Final    FEW WBC PRESENT,BOTH PMN AND MONONUCLEAR RARE GRAM NEGATIVE RODS    Culture   Final    CULTURE REINCUBATED FOR BETTER GROWTH Performed at [facility], [place] [address] [place], [place] [address]    Report Status PENDING  Incomplete    Assessment & Plan:  Patient was evaluated and treated and all questions answered.  R Foot Infection s/p Resection -Path pending. -Micro pending -To OR today for repeat debridement and wound closure with possible filleted toe flap.  -Continue NPO  -Heel WB to the right foot in surgical shoe.

## 2018-07-17 NOTE — Progress Notes (Signed)
[facility] Progress Note   Dialysis Orders: MWF at [facility] 4 hours, BFR 450, DFR 600, EDW 79.5 kg, 3K/2 calcium, heparin 5000 unit bolus, calcitriol 1 mcg p.o. 3 times weekly, left IJ TDC  Assessment/Plan: 1. DM foot infection w/draining wound - Osteo noted on bone scan. Cultures pending. On ABX.  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.  Path pending. [facility]VS consulting for possible arteriogram w/intervention to improve blood flow. 2. ESRD - on HD MWF.  Last K 4.7. [place] exchanged 12/5.  HD yesterday per regular schedule. Net UF removed 2L.  HD again Monday 12/9. Slowly maturing LU AVF - VVS consulting.  3. Anemia of CKD- Hgb 11.8 pre surgery. No indication for ESA at this time. 4. Secondary hyperparathyroidism - Continue binders and sensipar.  5. Hypotension/volume - BP soft, on midodrine.  Appears euvolemic on exam. Titrate down as tolerated.  6. Nutrition - Renal diet w/fluid restrictions. Alb low, cont protein supplements.  Jen Mow, PA-C [facility]  Pager: [phone] 07/17/2018,11:33 AM  LOS: 4 days   Subjective:    Seen and examined at bedside post surgery.  Feeling ok.  Denies pain, SOB, edema, HA, and n/v/d.     Objective Vitals:   07/17/18 0943 07/17/18 0955 07/17/18 0957 07/17/18 1025  BP: (!) 86/48   (!) 88/53  Pulse: 87  85 82  Resp: 16  (!) 22   Temp:  97.9 F (36.6 C)  98.3 F (36.8 C)  TempSrc:    Oral  SpO2: (!) 89%  95% 94%  Weight:      Height:       Physical Exam General:NAD, pleasant male, laying in bed Heart:RRR, no mrg Lungs:CTAB, nml WOB Abdomen:soft, NTND Extremities:no LE edema, R foot dressed Dialysis Access: TDC in L chest   Filed Weights   07/15/18 0600 07/16/18 1900 07/16/18 2307  Weight: 83 kg 80.6 kg 78.2 kg    Intake/Output Summary (Last 24 hours) at 07/17/2018 1133 Last data filed at  07/17/2018 1062 Gross per 24 hour  Intake 1090.94 ml  Output 2145 ml  Net -1054.06 ml    Additional Objective Labs: Basic Metabolic Panel: Recent Labs  Lab 07/13/18 1252 07/14/18 0156 07/15/18 1726 07/16/18 1911  NA 136 139 137 136  K 5.1 4.7 5.2* 4.7  CL 97* 97*  --  97*  CO2 23 24  --  21*  GLUCOSE 104* 93 84 144*  BUN 41* 47*  --  60*  CREATININE 9.49* 9.66*  --  11.74*  CALCIUM 9.0 8.9  --  8.3*  PHOS  --   --   --  7.2*   Liver Function Tests: Recent Labs  Lab 07/13/18 1252 07/16/18 1911  AST 19  --   ALT 11  --   ALKPHOS 56  --   BILITOT 0.8  --   PROT 8.1  --   ALBUMIN 3.1* 2.6*   CBC: Recent Labs  Lab 07/13/18 1252 07/14/18 0156 07/15/18 1726 07/16/18 1911  WBC 7.3 6.7  --  9.8  NEUTROABS 5.7  --   --   --   HGB 13.4 12.5* 15.3 11.8*  HCT 43.2 41.5 45.0 38.8*  MCV 108.0* 106.4*  --  106.0*  PLT 308 337  --  224   Blood Culture    Component Value Date/Time   SDES TISSUE 07/15/2018 1850  SPECREQUEST SOFT TISSUE RIGHT FOOT 07/15/2018 1850   CULT  07/15/2018 1850    CULTURE REINCUBATED FOR BETTER GROWTH Performed at [facility], [place] [address] [place], [place] [address]    REPTSTATUS PENDING 07/15/2018 1850   CBG: Recent Labs  Lab 07/16/18 1630 07/17/18 0447 07/17/18 0829 07/17/18 0948 07/17/18 1130  GLUCAP 121* 78 72 104* 122*   Studies/Results: Ir Fluoro Guide Cv Line Left  Result Date: 07/15/2018 INDICATION: Continued poor function of tunneled left IJ hemodialysis catheter, post revision x 2. EXAM: TUNNELED CENTRAL VENOUS HEMODIALYSIS CATHETER REVISION WITH FLUOROSCOPIC GUIDANCE VENOUS ANGIOPLASTY OF CENTRAL VENOUS STENOSIS MEDICATIONS: patient is already on adequate prophylactic antibiotic coverage ANESTHESIA/SEDATION: Intravenous Fentanyl and Versed were administered as conscious sedation during continuous monitoring of the patient's level of consciousness and physiological / cardiorespiratory status by the radiology RN,  with a total moderate sedation time of 18 minutes. FLUOROSCOPY TIME:  3.2 minute; 604  uGym2 DAP COMPLICATIONS: None immediate. PROCEDURE: Informed written consent was obtained from the patient after a discussion of the risks, benefits, and alternatives to treatment. Questions regarding the procedure were encouraged and answered. The left neck, catheter, and chest were prepped with chlorhexidine in a sterile fashion, and a sterile drape was applied covering the operative field. Maximum barrier sterile technique with sterile gowns and gloves were used for the procedure. A timeout was performed prior to the initiation of the procedure. The subcutaneous tract of the left IJ tunneled catheter was infiltrated with 1% lidocaine. The catheter was partially withdrawn and central venography was performed through the blue venous return lumen of the catheter. 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 Some eccentric fibrin sheath/thrombus at the site of the prior catheter tip at the cavoatrial junction. Distal SVC is patent with good flow into the right atrium, minimal reflux into the IVC. Catheter was exchanged over a Bentson wire for a 5 [place] Kumpe catheter, which allowed access to the IVC. Catheter exchanged for a 12 mm x 4 cm atlas angioplasty balloon, advanced into distal left innominate vein and proximal SVC for overlapping venous angioplasty at 18 atmospheres. The balloon catheter was then advanced into the IVC and exchanged over a stiff Amplatz Glidewire for a new 28 cm Palindrome tunneled hemodialysis catheter. This was advanced to the mid right atrium. Both lumens aspirated and flushed easily. Good catheter position confirmed on chest rate fluoroscopy, documentation stored. The catheter was secured externally with 0 Prolene suture in flushed per protocol. Dressings were applied. The patient tolerated the procedure well without  immediate post procedural complication. IMPRESSION: 1. 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 2. The lesions responded well  to 12 mm balloon angioplasty. 3. Technically successful exchange and revision of tunneled left IJ hemodialysis catheter, a 28 cm Palindrome HD catheter advanced to the right atrium. Electronically Signed   By: Lucrezia Europe M.D.   On: 07/15/2018 17:22   Ir US Guide Vasc Access Left  Result Date: 07/15/2018 INDICATION: Continued poor function of tunneled left IJ hemodialysis catheter, post revision x 2. EXAM: TUNNELED CENTRAL VENOUS HEMODIALYSIS CATHETER REVISION WITH FLUOROSCOPIC GUIDANCE VENOUS ANGIOPLASTY OF CENTRAL VENOUS STENOSIS MEDICATIONS: patient is already on adequate prophylactic antibiotic coverage ANESTHESIA/SEDATION: Intravenous Fentanyl and Versed were administered as conscious sedation during  continuous monitoring of the patient's level of consciousness and physiological / cardiorespiratory status by the radiology RN, with a total moderate sedation time of 18 minutes. FLUOROSCOPY TIME:  3.2 minute; 974  uGym2 DAP COMPLICATIONS: None immediate. PROCEDURE: Informed written consent was obtained from the patient after a discussion of the risks, benefits, and alternatives to treatment. Questions regarding the procedure were encouraged and answered. The left neck, catheter, and chest were prepped with chlorhexidine in a sterile fashion, and a sterile drape was applied covering the operative field. Maximum barrier sterile technique with sterile gowns and gloves were used for the procedure. A timeout was performed prior to the initiation of the procedure. The subcutaneous tract of the left IJ tunneled catheter was infiltrated with 1% lidocaine. The catheter was partially withdrawn and central venography was  performed through the blue venous return lumen of the catheter. 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 Some eccentric fibrin sheath/thrombus at the site of the prior catheter tip at the cavoatrial junction. Distal SVC is patent with good flow into the right atrium, minimal reflux into the IVC. Catheter was exchanged over a Bentson wire for a 5 [place] Kumpe catheter, which allowed access to the IVC. Catheter exchanged for a 12 mm x 4 cm atlas angioplasty balloon, advanced into distal left innominate vein and proximal SVC for overlapping venous angioplasty at 18 atmospheres. The balloon catheter was then advanced into the IVC and exchanged over a stiff Amplatz Glidewire for a new 28 cm Palindrome tunneled hemodialysis catheter. This was advanced to the mid right atrium. Both lumens aspirated and flushed easily. Good catheter position confirmed on chest rate fluoroscopy, documentation stored. The catheter was secured externally with 0 Prolene suture in flushed per protocol. Dressings were applied. The patient tolerated the procedure well without immediate post procedural complication. IMPRESSION: 1. 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 2. The lesions responded well  to 12 mm balloon angioplasty. 3. Technically successful exchange and revision of tunneled left IJ hemodialysis catheter, a 28 cm Palindrome HD catheter advanced to the right atrium. Electronically Signed   By: Lucrezia Europe M.D.   On: 07/15/2018 17:22   Dg Foot 2 Views Right  Result Date: 07/17/2018 CLINICAL DATA:  Fifth toe resection. EXAM: RIGHT FOOT - 2 VIEW COMPARISON:  Right foot x-rays dated July 15, 2018. FINDINGS: Interval resection of the fifth toe. Unchanged partial resection of  the fifth metatarsal. Unchanged comminuted intra-articular fracture of the first proximal phalanx head. 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 Bone mineralization is normal. Postsurgical changes in the soft tissues along the lateral forefoot with wound VAC in place. IMPRESSION: 1. Interval amputation of the fifth toe.  No acute abnormality. 2. Unchanged comminuted fracture of the first proximal phalanx. Electronically Signed   By: Titus Dubin M.D.   On: 07/17/2018 10:59   Dg Foot 2 Views Right  Result Date: 07/15/2018 CLINICAL DATA:  Postop irrigation and debridement. Fifth metatarsal resection. History of diabetes. EXAM: RIGHT FOOT - 2 VIEW COMPARISON:  Preop study from 07/13/2018 FINDINGS: The patient has undergone amputation of the distal half of the right fifth metatarsal with postsurgical change involving the soft tissues of the lateral forefoot. Vacuum device projects over the lateral aspect of the forefoot  at site of amputation. Redemonstration of comminuted intra-articular fracture involving the head of the first proximal phalanx. Osteoarthritis of the toes otherwise noted. No additional new findings. IMPRESSION: 1. 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 2. Intra-articular fracture of the first proximal phalangeal head as before with comminution. No significant change. Electronically Signed   By: Ashley Royalty M.D.   On: 07/15/2018 19:32   Nm Bone Scan 3 Phase Lower Extremity  Result Date: 07/15/2018 CLINICAL DATA:  Soft tissue ulceration adjacent to the fifth metatarsal. Osteomyelitis suggested on radiograph. EXAM: NUCLEAR MEDICINE 3-PHASE BONE SCAN TECHNIQUE: Radionuclide angiographic images, immediate static blood pool images, and 3-hour delayed static images were obtained of the feet after intravenous injection of radiopharmaceutical. RADIOPHARMACEUTICALS:  21.9 mCi Tc-66m MDP IV COMPARISON:  Radiograph 07/13/2018  FINDINGS: Vascular phase: Mild diffuse hyperemia to the RIGHT foot compared to the LEFT. Blood pool phase: Diffuse increased blood pool activity in the RIGHT foot compared to the LEFT. Some focality in the great toe on the RIGHT. Focality also the head of the fifth metacarpal Delayed phase: Focal activity in the proximal phalanx of the first digit and distal fifth metacarpal. IMPRESSION: Focal delayed activity in the proximal phalanx of the first digit and distal aspect of the fifth metatarsal of the RIGHT foot. With comparison to radiograph 07/13/2018, the proximal phalanx first digit uptake is favored posttraumatic. Uptake at the distal aspect of the fifth metatarsal is favored osteomyelitis. Electronically Signed   By: Suzy Bouchard M.D.   On: 07/15/2018 12:44   Ir Pta Venous Except Dialysis Circuit  Result Date: 07/15/2018 INDICATION: Continued poor function of tunneled left IJ hemodialysis catheter, post revision x 2. EXAM: TUNNELED CENTRAL VENOUS HEMODIALYSIS CATHETER REVISION WITH FLUOROSCOPIC GUIDANCE VENOUS ANGIOPLASTY OF CENTRAL VENOUS STENOSIS MEDICATIONS: patient is already on adequate prophylactic antibiotic coverage ANESTHESIA/SEDATION: Intravenous Fentanyl and Versed were administered as conscious sedation during continuous monitoring of the patient's level of consciousness and physiological / cardiorespiratory status by the radiology RN, with a total moderate sedation time of 18 minutes. FLUOROSCOPY TIME:  3.2 minute; 932  uGym2 DAP COMPLICATIONS: None immediate. PROCEDURE: Informed written consent was obtained from the patient after a discussion of the risks, benefits, and alternatives to treatment. Questions regarding the procedure were encouraged and answered. The left neck, catheter, and chest were prepped with chlorhexidine in a sterile fashion, and a sterile drape was applied covering the operative field. Maximum barrier sterile technique with sterile gowns and gloves were used for the  procedure. A timeout was performed prior to the initiation of the procedure. The subcutaneous tract of the left IJ tunneled catheter was infiltrated with 1% lidocaine. The catheter was partially withdrawn and central venography was performed through the blue venous return lumen of the catheter. 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 Some eccentric fibrin sheath/thrombus at the site of the prior catheter tip at the cavoatrial junction. Distal SVC is patent with good flow into the right atrium, minimal reflux into the IVC. Catheter was exchanged over a Bentson wire for a 5 [place] Kumpe catheter, which allowed access to the IVC. Catheter exchanged for a 12 mm x 4 cm atlas angioplasty balloon, advanced into distal left innominate vein and proximal SVC for overlapping venous angioplasty at 18 atmospheres. The balloon catheter was then advanced into the IVC and exchanged over a stiff Amplatz Glidewire for a new  28 cm Palindrome tunneled hemodialysis catheter. This was advanced to the mid right atrium. Both lumens aspirated and flushed easily. Good catheter position confirmed on chest rate fluoroscopy, documentation stored. The catheter was secured externally with 0 Prolene suture in flushed per protocol. Dressings were applied. The patient tolerated the procedure well without immediate post procedural complication. IMPRESSION: 1. 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 2. The lesions responded well  to 12 mm balloon angioplasty. 3. Technically successful exchange and revision of tunneled left IJ hemodialysis catheter, a 28 cm Palindrome HD catheter advanced to the right atrium. Electronically Signed   By: Lucrezia Europe M.D.   On: 07/15/2018 17:22     Medications: . sodium chloride 10 mL/hr at 07/15/18 1721  . cefTRIAXone (ROCEPHIN)  IV Stopped (07/16/18 1415)  . metronidazole 500 mg (07/16/18 2355)  . vancomycin 750 mg (07/16/18 2150)   . aspirin EC  81 mg Oral Daily  . Chlorhexidine Gluconate Cloth  6 each Topical Q0600  . cinacalcet  30 mg Oral Q breakfast  . feeding supplement (NEPRO CARB STEADY)  237 mL Oral Q24H  . feeding supplement (PRO-STAT SUGAR FREE 64)  30 mL Oral BID WC  . ferric citrate  630 mg Oral TID WC  . gabapentin  100 mg Oral QHS  . [START ON 07/19/2018] heparin  1,000 Units Intravenous Q M,W,F-HD  . insulin aspart  0-15 Units Subcutaneous TID WC  . insulin aspart  0-5 Units Subcutaneous QHS  . lidocaine  20 mL Infiltration Once  . midodrine  10 mg Oral Once per day on Mon Wed Fri  . multivitamin  1 tablet Oral QHS

## 2018-07-17 NOTE — Anesthesia Procedure Notes (Signed)
Procedure Name: [place] Performed by: Inda Coke, CRNA Pre-anesthesia Checklist: Patient identified, Emergency Drugs available, Suction available, Timeout performed and Patient being monitored Patient Re-evaluated:Patient Re-evaluated prior to induction Oxygen Delivery Method: Simple face mask Induction Type: IV induction Dental Injury: Teeth and Oropharynx as per pre-operative assessment

## 2018-07-17 NOTE — Transfer of Care (Signed)
Immediate Anesthesia Transfer of Care Note  Patient: [facility]  Procedure(s) Performed: Wound Debridement Possible Closure Filleted toe flap Right Foot (Right )  Patient Location: PACU  Anesthesia Type:MAC  Level of Consciousness: awake and alert   Airway & Oxygen Therapy: Patient Spontanous Breathing  Post-op Assessment: Report given to RN and Post -op Vital signs reviewed and stable  Post vital signs: Reviewed and stable  Last Vitals:  Vitals Value Taken Time  BP 139/98 07/17/2018  8:28 AM  Temp    Pulse 81 07/17/2018  8:33 AM  Resp 24 07/17/2018  8:33 AM  SpO2 93 % 07/17/2018  8:33 AM  Vitals shown include unvalidated device data.  Last Pain:  Vitals:   07/17/18 0445  TempSrc: Oral  PainSc:       Patients Stated Pain Goal: 3 (61/60/73 7106)  Complications: No apparent anesthesia complications

## 2018-07-17 NOTE — Anesthesia Postprocedure Evaluation (Signed)
Anesthesia Post Note  Patient: [facility]  Procedure(s) Performed: Wound Debridement; Closure Filleted toe flap Right Foot (Right Foot)     Patient location during evaluation: PACU Anesthesia Type: MAC Level of consciousness: awake and alert Pain management: pain level controlled Vital Signs Assessment: post-procedure vital signs reviewed and stable Respiratory status: spontaneous breathing, nonlabored ventilation and respiratory function stable Cardiovascular status: stable and blood pressure returned to baseline Anesthetic complications: no    Last Vitals:  Vitals:   07/17/18 0844 07/17/18 0851  BP: (!) 79/45 (!) 85/61  Pulse: (!) 51 83  Resp: (!) 27 (!) 21  Temp:    SpO2: (!) 88% 93%    Last Pain:  Vitals:   07/17/18 0828  TempSrc:   PainSc: 0-No pain                 Audry Pili

## 2018-07-17 NOTE — Progress Notes (Addendum)
PROGRESS NOTE    [name]  FWY:[id number] DOB: [birth year] DOA: 07/13/2018 PCP: [name], MD    Brief Narrative:  Timothy Faulkner is a 72 y.o. male with medical history significant of DM; diabetic foot ulcer/PAD; HTN; HLD; ESRD on MWF HD; and CHF with AICD presenting with foot pain.  He started with an ulcer that opened on Saturday, although he had a sore there for a few weeks. He was admitted for possible OM of the right foot.   Assessment & Plan:   Principal Problem:   Diabetic foot infection ([place]) Active Problems:   Essential hypertension   Automatic implantable cardioverter-defibrillator in situ   ESRD (end stage renal disease) on dialysis [facility])   Diabetic infection of right foot ([place])   Cardiomyopathy, dilated, nonischemic (HCC)   Peripheral arterial disease (HCC)   Osteomyelitis of right foot ([place])   Vascular calcification   Cellulitis and abscess of foot, except toes   Acute osteomyelitis of metatarsal bone of right foot (HCC)   Diabetic ulcer of midfoot associated with diabetes mellitus due to underlying condition, with necrosis of bone ([place])   Osteomyelitis of the right great toe and surrounding cellulitis of the right foot. :  Admitted for IV antibiotics and Dr March Rummage with Podiatry performed incision bone cortex, bone biopsy of the distal and proximal right fifth metatarsal, metatarsectomy, and wound VAC was placed.  He  Underwent wound debridement again today. Wound vac in place.    Wound cultures from 12/3 show enterobacter and klebsiella.   ABI's done, they reveal  right: Resting right ankle-brachial index indicates moderate right lower extremity arterial disease. The right toe-brachial index is abnormal.  Left: Resting left ankle-brachial index indicates moderate left lower extremity arterial disease.  Vascular surgery consulted for abnormal ABIs.  Continue with IV antibiotics and pain control.   Plan for right leg angiogram and left arm fistulogram on  Monday.   Physical therapy and Occupational Therapy evaluations ordered.     ESRD on HD.  Further management as per nephrology.  Underwent TDC exchange by IR.  07/15/2018   Hypertension:  Well controlled.    NICM:  Pt currently denies any chest pain or sob.    Mild anemia of chronic disease.  Hemoglobin stable.    Mild hyperkalemia   DVT prophylaxis: heparin.  Subcu Code Status: full code.  Family Communication: none at bedside.  Disposition Plan: Pending further work up.    Consultants:   Podiatry.  Nephrology  Vascular surgery    Procedures: Metatarsectomy, bone biopsy of the fifth metatarsal wound VAC placement on 07/15/2018 by [name]   Antimicrobials: vancomycin , flagyl and rocephin since admission.    Subjective: No chest pain, shortness of breath or abdominal pain. Pain controlled. No new complaints.   Objective: Vitals:   07/17/18 0955 07/17/18 0957 07/17/18 1000 07/17/18 1025  BP:    (!) 88/53  Pulse:  85 88 82  Resp:  (!) 22 18   Temp: 97.9 F (36.6 C)  97.9 F (36.6 C) 98.3 F (36.8 C)  TempSrc:    Oral  SpO2:  95% 93% 94%  Weight:      Height:        Intake/Output Summary (Last 24 hours) at 07/17/2018 1750 Last data filed at 07/17/2018 1328 Gross per 24 hour  Intake 1010 ml  Output 2020 ml  Net -1010 ml   Filed Weights   07/15/18 0600 07/16/18 1900 07/16/18 2307  Weight: 83 kg 80.6  kg 78.2 kg    Examination:  General exam: Alert and comfortable in chair, no distress.  Respiratory system: Clear to auscultation bilaterally, no wheezing or rhonchi.  Cardiovascular system: S1 & S2 heard, RRR. No JVD,  Gastrointestinal system: Abdomen is soft, NT nd bs+ Central nervous system: Alert and oriented. No focal neurological deficits. Extremities: Right foot bandaged wound VAC attached Skin: see above.  Psychiatry: Mood & affect appropriate.     Data Reviewed: I have personally reviewed following labs and imaging  studies  CBC: Recent Labs  Lab 07/13/18 1252 07/14/18 0156 07/15/18 1726 07/16/18 1911  WBC 7.3 6.7  --  9.8  NEUTROABS 5.7  --   --   --   HGB 13.4 12.5* 15.3 11.8*  HCT 43.2 41.5 45.0 38.8*  MCV 108.0* 106.4*  --  106.0*  PLT 308 337  --  616   Basic Metabolic Panel: Recent Labs  Lab 07/13/18 1252 07/14/18 0156 07/15/18 1726 07/16/18 1911  NA 136 139 137 136  K 5.1 4.7 5.2* 4.7  CL 97* 97*  --  97*  CO2 23 24  --  21*  GLUCOSE 104* 93 84 144*  BUN 41* 47*  --  60*  CREATININE 9.49* 9.66*  --  11.74*  CALCIUM 9.0 8.9  --  8.3*  PHOS  --   --   --  7.2*   GFR: Estimated Creatinine Clearance: 5.5 mL/min (A) (by C-G formula based on SCr of 11.74 mg/dL (H)). Liver Function Tests: Recent Labs  Lab 07/13/18 1252 07/16/18 1911  AST 19  --   ALT 11  --   ALKPHOS 56  --   BILITOT 0.8  --   PROT 8.1  --   ALBUMIN 3.1* 2.6*   No results for input(s): LIPASE, AMYLASE in the last 168 hours. No results for input(s): AMMONIA in the last 168 hours. Coagulation Profile: No results for input(s): INR, PROTIME in the last 168 hours. Cardiac Enzymes: No results for input(s): CKTOTAL, CKMB, CKMBINDEX, TROPONINI in the last 168 hours. BNP (last 3 results) No results for input(s): PROBNP in the last 8760 hours. HbA1C: No results for input(s): HGBA1C in the last 72 hours. CBG: Recent Labs  Lab 07/17/18 0447 07/17/18 0829 07/17/18 0948 07/17/18 1130 07/17/18 1712  GLUCAP 78 72 104* 122* 106*   Lipid Profile: No results for input(s): CHOL, HDL, LDLCALC, TRIG, CHOLHDL, LDLDIRECT in the last 72 hours. Thyroid Function Tests: No results for input(s): TSH, T4TOTAL, FREET4, T3FREE, THYROIDAB in the last 72 hours. Anemia Panel: No results for input(s): VITAMINB12, FOLATE, FERRITIN, TIBC, IRON, RETICCTPCT in the last 72 hours. Sepsis Labs: Recent Labs  Lab 07/13/18 1331 07/13/18 1503 07/13/18 1819  LATICACIDVEN 2.75* 2.33* 2.58*    Recent Results (from the past 240  hour(s))  Blood culture (routine x 2)     Status: None (Preliminary result)   Collection Time: 07/13/18 12:52 PM  Result Value Ref Range Status   Specimen Description BLOOD RIGHT HAND  Final   Special Requests   Final    BOTTLES DRAWN AEROBIC ONLY Blood Culture results may not be optimal due to an inadequate volume of blood received in culture bottles   Culture   Final    NO GROWTH 4 DAYS Performed at [facility] [address], [place], [place] [address]    Report Status PENDING  Incomplete  Blood culture (routine x 2)     Status: None (Preliminary result)   Collection  Time: 07/13/18 12:57 PM  Result Value Ref Range Status   Specimen Description BLOOD BLOOD RIGHT FOREARM  Final   Special Requests   Final    BOTTLES DRAWN AEROBIC AND ANAEROBIC Blood Culture results may not be optimal due to an inadequate volume of blood received in culture bottles   Culture   Final    NO GROWTH 4 DAYS Performed at [facility], [place] [address] [place], [place] [address]    Report Status PENDING  Incomplete  WOUND CULTURE     Status: Abnormal   Collection Time: 07/13/18  1:24 PM  Result Value Ref Range Status   MICRO NUMBER: [id number]  Final   SPECIMEN QUALITY: Adequate  Final   SOURCE: NOT GIVEN  Final   STATUS: FINAL  Final   GRAM STAIN:   Final    No white blood cells seen No epithelial cells seen Many Gram positive cocci in pairs Many Gram negative bacilli   ISOLATE 1: Enterobacter cloacae complex (A)  Final    Comment: Heavy growth of Enterobacter cloacae complex   ISOLATE 2: Klebsiella oxytoca (A)  Final    Comment: Heavy growth of Klebsiella oxytoca      Susceptibility   Klebsiella oxytoca - AEROBIC CULT, GRAM STAIN NEGATIVE 2    AMOX/CLAVULANIC <=2 Sensitive     AMPICILLIN  Resistant     AMPICILLIN/SULBACTAM <=2 Sensitive     CEFAZOLIN <=4 Not Reportable     CEFEPIME <=1 Sensitive     CEFTRIAXONE <=1 Sensitive     CIPROFLOXACIN <=0.25 Sensitive     LEVOFLOXACIN <=0.12  Sensitive     ERTAPENEM <=0.5 Sensitive     GENTAMICIN <=1 Sensitive     IMIPENEM <=0.25 Sensitive     PIP/TAZO <=4 Sensitive     TOBRAMYCIN <=1 Sensitive     TRIMETH/SULFA* <=20 Sensitive      * For infections other than uncomplicated UTIcaused by E. coli, K. pneumoniae or P. mirabilis:Cefazolin is resistant if MIC > or = 8 mcg/mL.(Distinguishing susceptible versus intermediatefor isolates with MIC < or = 4 mcg/mL requiresadditional testing.)Legend:S = Susceptible  I = IntermediateR = Resistant  NS = Not susceptible* = Not tested  NR = Not reported**NN = See antimicrobic comments   Enterobacter cloacae complex - AEROBIC CULT, GRAM STAIN NEGATIVE 1    AMOX/CLAVULANIC >=32 Resistant     CEFAZOLIN* >=64 Resistant      * For infections other than uncomplicated UTIcaused by E. coli, K. pneumoniae or P. mirabilis:Cefazolin is resistant if MIC > or = 8 mcg/mL.(Distinguishing susceptible versus intermediatefor isolates with MIC < or = 4 mcg/mL requiresadditional testing.)    CEFEPIME <=1 Sensitive     CEFTRIAXONE <=1 Sensitive     CIPROFLOXACIN <=0.25 Sensitive     LEVOFLOXACIN <=0.12 Sensitive     ERTAPENEM <=0.5 Sensitive     GENTAMICIN <=1 Sensitive     IMIPENEM <=0.25 Sensitive     PIP/TAZO <=4 Sensitive     TOBRAMYCIN <=1 Sensitive     TRIMETH/SULFA <=20 Sensitive   Surgical pcr screen     Status: None   Collection Time: 07/13/18  9:11 PM  Result Value Ref Range Status   MRSA, PCR NEGATIVE NEGATIVE Final   Staphylococcus aureus NEGATIVE NEGATIVE Final    Comment: (NOTE) The Xpert SA Assay (FDA approved for NASAL specimens in patients 55 years of age and older), is one component of a comprehensive surveillance program. It is not intended to diagnose infection nor  to guide or monitor treatment. Performed at [facility], [place] [address], [place], [place] [address]   Aerobic/Anaerobic Culture (surgical/deep wound)     Status: None (Preliminary result)   Collection Time: 07/15/18   6:47 PM  Result Value Ref Range Status   Specimen Description BONE TISSUE  Final   Special Requests METATARSAL BONE FROM RIGHT FOOT  Final   Gram Stain   Final    MODERATE WBC PRESENT,BOTH PMN AND MONONUCLEAR NO ORGANISMS SEEN Performed at [facility], [place] [address] [place], [place] [address]    Culture   Final    RARE GRAM NEGATIVE RODS NO ANAEROBES ISOLATED; CULTURE IN PROGRESS FOR 5 DAYS    Report Status PENDING  Incomplete  Aerobic/Anaerobic Culture (surgical/deep wound)     Status: None (Preliminary result)   Collection Time: 07/15/18  6:50 PM  Result Value Ref Range Status   Specimen Description TISSUE  Final   Special Requests SOFT TISSUE RIGHT FOOT  Final   Gram Stain   Final    FEW WBC PRESENT,BOTH PMN AND MONONUCLEAR RARE GRAM NEGATIVE RODS Performed at [facility], [place] [address], [place], [place] [address]    Culture   Final    FEW GRAM NEGATIVE RODS FEW ENTEROCOCCUS FAECALIS IDENTIFICATION AND SUSCEPTIBILITIES TO FOLLOW FOR ORGANISM 1 SUSCEPTIBILITIES TO FOLLOW FOR ORGANISM 2 NO ANAEROBES ISOLATED; CULTURE IN PROGRESS FOR 5 DAYS    Report Status PENDING  Incomplete         Radiology Studies: Dg Foot 2 Views Right  Result Date: 07/17/2018 CLINICAL DATA:  Fifth toe resection. EXAM: RIGHT FOOT - 2 VIEW COMPARISON:  Right foot x-rays dated July 15, 2018. FINDINGS: Interval resection of the fifth toe. Unchanged partial resection of the fifth metatarsal. Unchanged comminuted intra-articular fracture of the first proximal phalanx head. 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 Bone mineralization is normal. Postsurgical changes in the soft tissues along the lateral forefoot with wound VAC in place. IMPRESSION: 1. Interval amputation of the fifth toe.  No acute abnormality. 2. Unchanged comminuted fracture of the first proximal phalanx. Electronically Signed   By: Titus Dubin M.D.   On: 07/17/2018 10:59   Dg Foot 2 Views  Right  Result Date: 07/15/2018 CLINICAL DATA:  Postop irrigation and debridement. Fifth metatarsal resection. History of diabetes. EXAM: RIGHT FOOT - 2 VIEW COMPARISON:  Preop study from 07/13/2018 FINDINGS: The patient has undergone amputation of the distal half of the right fifth metatarsal with postsurgical change involving the soft tissues of the lateral forefoot. Vacuum device projects over the lateral aspect of the forefoot at site of amputation. Redemonstration of comminuted intra-articular fracture involving the head of the first proximal phalanx. Osteoarthritis of the toes otherwise noted. No additional new findings. IMPRESSION: 1. 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 2. Intra-articular fracture of the first proximal phalangeal head as before with comminution. No significant change. Electronically Signed   By: Ashley Royalty M.D.   On: 07/15/2018 19:32   Vas US Duplex Dialysis Access (avf, Avg)  Result Date: 07/17/2018 DIALYSIS ACCESS Reason for Exam: Non-maturation of AVF. Access Site: Left Upper Extremity. Access Type: Radial-cephalic AVF. Performing Technologist: Landry Mellow RDMS, RVT  Examination Guidelines: A complete evaluation includes B-mode imaging, spectral Doppler, color Doppler, and power Doppler as needed of all accessible portions of each vessel. Unilateral testing is considered an integral part of a complete  examination. Limited examinations for reoccurring indications may be performed as noted.  Findings: +--------------------+----------+-----------------+--------+ AVF                 PSV (cm/s)Flow Vol (mL/min)Comments +--------------------+----------+-----------------+--------+ Native artery inflow   120           427                +--------------------+----------+-----------------+--------+ AVF Anastomosis        400           845                +--------------------+----------+-----------------+--------+   +------------+----------+-------------+----------+--------+ OUTFLOW VEINPSV (cm/s)Diameter (cm)Depth (cm)Describe +------------+----------+-------------+----------+--------+ Prox UA        134                                    +------------+----------+-------------+----------+--------+ Mid UA          92                    0.47            +------------+----------+-------------+----------+--------+ Dist UA        350        0.32        0.51            +------------+----------+-------------+----------+--------+ AC Fossa       400        0.51        0.95            +------------+----------+-------------+----------+--------+   Summary: Patent arteriovenous fistula. *See table(s) above for measurements and observations.   --------------------------------------------------------------------------------   Preliminary         Scheduled Meds: . aspirin EC  81 mg Oral Daily  . Chlorhexidine Gluconate Cloth  6 each Topical Q0600  . cinacalcet  30 mg Oral Q breakfast  . feeding supplement (NEPRO CARB STEADY)  237 mL Oral Q24H  . feeding supplement (PRO-STAT SUGAR FREE 64)  30 mL Oral BID WC  . ferric citrate  630 mg Oral TID WC  . gabapentin  100 mg Oral QHS  . [START ON 07/19/2018] heparin  1,000 Units Intravenous Q M,W,F-HD  . insulin aspart  0-15 Units Subcutaneous TID WC  . insulin aspart  0-5 Units Subcutaneous QHS  . lidocaine  20 mL Infiltration Once  . midodrine  10 mg Oral Once per day on Mon Wed Fri  . multivitamin  1 tablet Oral QHS   Continuous Infusions: . sodium chloride 10 mL/hr at 07/15/18 1721  . cefTRIAXone (ROCEPHIN)  IV 2 g (07/17/18 1445)  . metronidazole 500 mg (07/17/18 1655)  . vancomycin 750 mg (07/16/18 2150)     LOS: 4 days    Time spent: 30 minutes.     Hosie Poisson, MD [facility] Pager [phone]   If 7PM-7AM, please contact night-coverage [email or web address] Password [email or web address] 07/17/2018, 5:50 PM

## 2018-07-18 LAB — CULTURE, BLOOD (ROUTINE X 2)
CULTURE: NO GROWTH
Culture: NO GROWTH

## 2018-07-18 LAB — GLUCOSE, CAPILLARY
GLUCOSE-CAPILLARY: 121 mg/dL — AB (ref 70–99)
Glucose-Capillary: 94 mg/dL (ref 70–99)
Glucose-Capillary: 139 mg/dL — ABNORMAL HIGH (ref 70–99)
Glucose-Capillary: 146 mg/dL — ABNORMAL HIGH (ref 70–99)

## 2018-07-18 MED ORDER — CHLORHEXIDINE GLUCONATE CLOTH 2 % EX PADS
6.0000 | MEDICATED_PAD | Freq: Every day | CUTANEOUS | Status: DC
  Administered 2018-07-18 – 2018-07-23 (×6): 6 via TOPICAL

## 2018-07-18 NOTE — Progress Notes (Signed)
Aortogram / fistulogram can not be done until Tuesday  Timothy Faulkner

## 2018-07-18 NOTE — Progress Notes (Signed)
IV team was present earlier in the am for IV consult but was called away for Code Blue. Patient remained without IV access.  IV team consult has been placed again Stat for IV access.  The patient has several IV antibiotics ordered.

## 2018-07-18 NOTE — Evaluation (Signed)
Physical Therapy Evaluation Patient Details Name: Timothy Faulkner MRN: [id number] DOB: [birth year] Today's Date: 07/18/2018   History of Present Illness  Patient is a 72 y/o male presenting to the ED on 07/13/18 with foot ulcer. I&D of R foot on 07/14/18. Repeat I&D and 5th metatarsal recection with application of wound vac on 07/15/18. Repeat I&D and wound closure on 07/17/18. PMH significant for DM; diabetic foot ulcer/PAD; HTN; HLD; ESRD on MWF HD; and CHF with AICD.     Clinical Impression  Timothy Faulkner is a very pleasant 72 y/o male admitted with the above listed diagnosis. Patient reports that prior to admission he was Mod I with all mobility - used a SPC intermittently for steadying with gait. Patient today requiring up to Min A for transfers and in room mobility RW with cueing throughout session to maintain WB status and for general safety concerns. PT to continue to follow acutely to maximize safe and independent mobility prior to d/c home,  Time spent educating patient on need for skin checks at B feet as well as discussion with nursing/MD on expectations of weight bearing and shoe wear at discharge as this is an area of concern for him.     Follow Up Recommendations Home health PT;Supervision for mobility/OOB    Equipment Recommendations  Rolling walker with 5" wheels    Recommendations for Other Services OT consult(as ordered)     Precautions / Restrictions Precautions Precautions: Fall Restrictions Weight Bearing Restrictions: Yes Other Position/Activity Restrictions: WB through heel only      Mobility  Bed Mobility Overal bed mobility: Needs Assistance Bed Mobility: Supine to Sit     Supine to sit: Supervision     General bed mobility comments: for line and tube management  Transfers Overall transfer level: Needs assistance Equipment used: Rolling walker (2 wheeled) Transfers: Sit to/from [facility] Sit to Stand: Min assist Stand pivot transfers:  Min assist       General transfer comment: cueing for hand placement, sequencing and safety; attempts to sit prior to full turn to chair increasing his fall risk  Ambulation/Gait Ambulation/Gait assistance: Min assist;+2 safety/equipment Gait Distance (Feet): 10 Feet Assistive device: Rolling walker (2 wheeled) Gait Pattern/deviations: Step-to pattern;Decreased stance time - right;Decreased weight shift to right;Antalgic Gait velocity: decreased   General Gait Details: education on WB status and how to use UE to offload R LE; required demonstration   Stairs            Wheelchair Mobility    Modified Rankin (Stroke Patients Only)       Balance Overall balance assessment: Needs assistance Sitting-balance support: No upper extremity supported;Feet supported Sitting balance-Leahy Scale: Good     Standing balance support: Bilateral upper extremity supported;During functional activity Standing balance-Leahy Scale: Poor Standing balance comment: requires RW for dynamic stability                             Pertinent Vitals/Pain Pain Assessment: No/denies pain    Home Living Family/patient expects to be discharged to:: Private residence Living Arrangements: Spouse/significant other;Children Available Help at Discharge: Friend(s);Available 24 hours/day Type of Home: House Home Access: Level entry     Home Layout: One level Home Equipment: Cane - single point;Shower seat      Prior Function Level of Independence: Independent with assistive device(s)         Comments: would use SPC for steadying; has been taking bird baths due  to catheter     Hand Dominance   Dominant Hand: Right    Extremity/Trunk Assessment   Upper Extremity Assessment Upper Extremity Assessment: Defer to OT evaluation    Lower Extremity Assessment Lower Extremity Assessment: Generalized weakness    Cervical / Trunk Assessment Cervical / Trunk Assessment: Normal   Communication   Communication: No difficulties  Cognition Arousal/Alertness: Awake/alert Behavior During Therapy: WFL for tasks assessed/performed Overall Cognitive Status: Within Functional Limits for tasks assessed                                        General Comments General comments (skin integrity, edema, etc.): patient very jovial; morivated to participate with therapies    Exercises     Assessment/Plan    PT Assessment Patient needs continued PT services  PT Problem List Decreased strength;Decreased activity tolerance;Decreased balance;Decreased mobility;Decreased knowledge of use of DME;Decreased safety awareness;Decreased knowledge of precautions       PT Treatment Interventions DME instruction;Gait training;Functional mobility training;Therapeutic activities;Therapeutic exercise;Balance training;Patient/family education    PT Goals (Current goals can be found in the Care Plan section)  Acute Rehab PT Goals Patient Stated Goal: return home PT Goal Formulation: With patient Time For Goal Achievement: 08/01/18 Potential to Achieve Goals: Good    Frequency Min 3X/week   Barriers to discharge        Co-evaluation PT/OT/SLP Co-Evaluation/Treatment: Yes Reason for Co-Treatment: For patient/therapist safety;To address functional/ADL transfers PT goals addressed during session: Mobility/safety with mobility;Balance;Proper use of DME         AM-PAC PT "6 Clicks" Mobility  Outcome Measure Help needed turning from your back to your side while in a flat bed without using bedrails?: None Help needed moving from lying on your back to sitting on the side of a flat bed without using bedrails?: None Help needed moving to and from a bed to a chair (including a wheelchair)?: A Little Help needed standing up from a chair using your arms (e.g., wheelchair or bedside chair)?: A Little Help needed to walk in hospital room?: A Little Help needed climbing 3-5  steps with a railing? : A Lot 6 Click Score: 19    End of Session Equipment Utilized During Treatment: Gait belt Activity Tolerance: Patient tolerated treatment well Patient left: in chair;with call bell/phone within reach;with chair alarm set Nurse Communication: Mobility status PT Visit Diagnosis: Unsteadiness on feet (R26.81);Other abnormalities of gait and mobility (R26.89);Muscle weakness (generalized) (M62.81)    Time: 5056-9794 PT Time Calculation (min) (ACUTE ONLY): 31 min   Charges:   PT Evaluation $PT Eval Moderate Complexity: 1 Mod        Lanney Gins, PT, DPT Supplemental Physical Therapist 07/18/18 10:05 AM Pager: [phone] Office: [phone]

## 2018-07-18 NOTE — Progress Notes (Signed)
Timothy Faulkner Progress Note   Dialysis Orders: MWF at [facility] 4 hours, BFR 450, DFR 600, EDW 79.5 kg, 3K/2 calcium, heparin 5000 unit bolus, calcitriol 1 mcg p.o. 3 times weekly, left IJ TDC  Assessment/Plan: 1.DM foot infection w/draining wound - Osteo noted on bone scan. WC from 12/3 show enterobacter & klebisella. On ABX. S/p 5th metatarsal amputation and debridement 12/7 & additional biopsy L 5th phalanx, delayed wound closure and toe flap 12/7 by [name].  Path pending. [facility]VS to complete angiogram and possible intervention on Monday. 2. ESRD -on HD MWF. Last K 4.7. Stanton exchanged 12/5.  next HD Monday 12/9. Slowly maturing LU AVF - Duplex completed 12/8. VVS to do fistulogram Monday. 3. Anemia of CKD-Hgb 11.8 pre surgery. No indication for ESA at this time. 4. Secondary hyperparathyroidism -Continue binders and sensipar. 5. Hypotension/volume -BP soft, on midodrine. Appears euvolemic on exam. Titrate down as tolerated.  6. Nutrition -Renal diet w/fluid restrictions. Alb low, cont protein supplements.  Jen Mow, PA-C [facility]  Pager: [phone] 07/18/2018,8:23 AM  LOS: 5 days   Subjective:    Seen and examined at bedside. No new complaints.  Denies pain, n/v/d, SOB and edema.  Objective Vitals:   07/17/18 1025 07/17/18 2141 07/18/18 0014 07/18/18 0423  BP: (!) 88/53 92/60 105/62 92/64  Pulse: 82 93 95 83  Resp:  19 19 19   Temp: 98.3 F (36.8 C) (!) 97.4 F (36.3 C) 98.5 F (36.9 C) 97.7 F (36.5 C)  TempSrc: Oral Oral Oral Oral  SpO2: 94% 100% 96% 93%  Weight:      Height:       Physical Exam General:NAD, pleasant male, laying in bed Heart:RRR, no mrg Lungs:CTAB, nml WOB Abdomen:soft, NTND, +BS Extremities:no LE edema, R foot dressed, wound vac in place Dialysis Access: [facility] in L chest, AVF in  LUE  Filed Weights   07/15/18 0600 07/16/18 1900 07/16/18 2307  Weight: 83 kg 80.6 kg 78.2 kg     Intake/Output Summary (Last 24 hours) at 07/18/2018 0823 Last data filed at 07/18/2018 0431 Gross per 24 hour  Intake 5415 ml  Output 0 ml  Net 5415 ml    Additional Objective Labs: Basic Metabolic Panel: Recent Labs  Lab 07/13/18 1252 07/14/18 0156 07/15/18 1726 07/16/18 1911  NA 136 139 137 136  K 5.1 4.7 5.2* 4.7  CL 97* 97*  --  97*  CO2 23 24  --  21*  GLUCOSE 104* 93 84 144*  BUN 41* 47*  --  60*  CREATININE 9.49* 9.66*  --  11.74*  CALCIUM 9.0 8.9  --  8.3*  PHOS  --   --   --  7.2*   Liver Function Tests: Recent Labs  Lab 07/13/18 1252 07/16/18 1911  AST 19  --   ALT 11  --   ALKPHOS 56  --   BILITOT 0.8  --   PROT 8.1  --   ALBUMIN 3.1* 2.6*   CBC: Recent Labs  Lab 07/13/18 1252 07/14/18 0156 07/15/18 1726 07/16/18 1911  WBC 7.3 6.7  --  9.8  NEUTROABS 5.7  --   --   --   HGB 13.4 12.5* 15.3 11.8*  HCT 43.2 41.5 45.0 38.8*  MCV 108.0* 106.4*  --  106.0*  PLT 308 337  --  224   Blood Culture    Component Value Date/Time   SDES TISSUE 07/15/2018 1850   SPECREQUEST SOFT TISSUE RIGHT FOOT  07/15/2018 1850   CULT  07/15/2018 1850    FEW GRAM NEGATIVE RODS FEW ENTEROCOCCUS FAECALIS IDENTIFICATION AND SUSCEPTIBILITIES TO FOLLOW FOR ORGANISM 1 SUSCEPTIBILITIES TO FOLLOW FOR ORGANISM 2 NO ANAEROBES ISOLATED; CULTURE IN PROGRESS FOR 5 DAYS    REPTSTATUS PENDING 07/15/2018 1850   CBG: Recent Labs  Lab 07/17/18 0948 07/17/18 1130 07/17/18 1712 07/17/18 2145 07/18/18 0634  GLUCAP 104* 122* 106* 178* 94   Studies/Results: Dg Foot 2 Views Right  Result Date: 07/17/2018 CLINICAL DATA:  Fifth toe resection. EXAM: RIGHT FOOT - 2 VIEW COMPARISON:  Right foot x-rays dated July 15, 2018. FINDINGS: Interval resection of the fifth toe. Unchanged partial resection of the fifth metatarsal. Unchanged comminuted intra-articular fracture of the first proximal phalanx head. 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 Bone mineralization is  normal. Postsurgical changes in the soft tissues along the lateral forefoot with wound VAC in place. IMPRESSION: 1. Interval amputation of the fifth toe.  No acute abnormality. 2. Unchanged comminuted fracture of the first proximal phalanx. Electronically Signed   By: Titus Dubin M.D.   On: 07/17/2018 10:59   Vas US Duplex Dialysis Access (avf, Avg)  Result Date: 07/18/2018 DIALYSIS ACCESS Reason for Exam: Non-maturation of AVF. Access Site: Left Upper Extremity. Access Type: Radial-cephalic AVF. Performing Technologist: Landry Mellow RDMS, RVT  Examination Guidelines: A complete evaluation includes B-mode imaging, spectral Doppler, color Doppler, and power Doppler as needed of all accessible portions of each vessel. Unilateral testing is considered an integral part of a complete examination. Limited examinations for reoccurring indications may be performed as noted.  Findings: +--------------------+----------+-----------------+--------+ AVF                 PSV (cm/s)Flow Vol (mL/min)Comments +--------------------+----------+-----------------+--------+ Native artery inflow   120           427                +--------------------+----------+-----------------+--------+ AVF Anastomosis        400           845                +--------------------+----------+-----------------+--------+  +------------+----------+-------------+----------+--------+ OUTFLOW VEINPSV (cm/s)Diameter (cm)Depth (cm)Describe +------------+----------+-------------+----------+--------+ Prox UA        134                                    +------------+----------+-------------+----------+--------+ Mid UA          92                    0.47            +------------+----------+-------------+----------+--------+ Dist UA        350        0.32        0.51            +------------+----------+-------------+----------+--------+ AC Fossa       400        0.51        0.95             +------------+----------+-------------+----------+--------+   Summary: Patent arteriovenous fistula. *See table(s) above for measurements and observations.  Diagnosing physician: Harold Barban MD Electronically signed by Harold Barban MD on 07/18/2018 at 8:23:05 AM.   --------------------------------------------------------------------------------   Final     Medications: . sodium chloride 10 mL/hr at 07/15/18 1721  . cefTRIAXone (ROCEPHIN)  IV 2 g (  07/17/18 1445)  . metronidazole 500 mg (07/18/18 3704)  . vancomycin 750 mg (07/16/18 2150)   . aspirin EC  81 mg Oral Daily  . Chlorhexidine Gluconate Cloth  6 each Topical Q0600  . cinacalcet  30 mg Oral Q breakfast  . feeding supplement (NEPRO CARB STEADY)  237 mL Oral Q24H  . feeding supplement (PRO-STAT SUGAR FREE 64)  30 mL Oral BID WC  . ferric citrate  630 mg Oral TID WC  . gabapentin  100 mg Oral QHS  . [START ON 07/19/2018] heparin  1,000 Units Intravenous Q M,W,F-HD  . insulin aspart  0-15 Units Subcutaneous TID WC  . insulin aspart  0-5 Units Subcutaneous QHS  . lidocaine  20 mL Infiltration Once  . midodrine  10 mg Oral Once per day on Mon Wed Fri  . multivitamin  1 tablet Oral QHS

## 2018-07-18 NOTE — Progress Notes (Signed)
PROGRESS NOTE    Timothy Faulkner  KZS:[id number] DOB: 10-29-1945 DOA: 07/13/2018 PCP: [name], MD    Brief Narrative:  Timothy Faulkner is a 72 y.o. male with medical history significant of DM; diabetic foot ulcer/PAD; HTN; HLD; ESRD on MWF HD; and CHF with AICD presenting with foot pain.  He started with an ulcer that opened on Saturday, although he had a sore there for a few weeks. He was admitted for possible OM of the right foot.   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 :  Admitted for IV antibiotics and Dr March Rummage with Podiatry performed incision bone cortex, bone biopsy of the distal and proximal right fifth metatarsal, metatarsectomy, and wound VAC was placed.  He  Underwent wound debridement again today. Wound vac in place.    Wound cultures from 12/3 show enterobacter cloacae complex and klebsiella oxytoca.  In addition to that bone biopsy grew Enterobacter and soft tissues culture grew Enterococcus faecalis.   ABI's done, they reveal  right: Resting right ankle-brachial index indicates moderate right lower extremity arterial disease. The right toe-brachial index is abnormal.  Left: Resting left ankle-brachial index indicates moderate left lower extremity arterial disease.  Vascular surgery consulted  for abnormal ABIs.  Continue with IV antibiotics and pain control.   Plan for right leg angiogram and left arm fistulogram on Monday.   Physical therapy and Occupational Therapy evaluations ordered.     ESRD on HD.  Further management as per nephrology.  Underwent TDC exchange by IR.  07/15/2018   Hypertension:  Well controlled.    NICM:  Pt currently denies any chest pain or sob.    Mild anemia of chronic disease.  Hemoglobin stable.    Mild hyperkalemia   DVT prophylaxis: heparin.  Subcu Code Status: full code.  Family Communication: none at bedside.  Disposition Plan: Pending further work up.    Consultants:   Podiatry.  Nephrology  Vascular surgery    Procedures: Metatarsectomy, bone biopsy of the fifth metatarsal wound VAC placement on 07/15/2018 by [name]   Antimicrobials: vancomycin , flagyl and rocephin since admission.    Subjective: No new complaints at this time.  Objective: Vitals:   07/17/18 2141 07/18/18 0014 07/18/18 0423 07/18/18 1443  BP: 92/60 105/62 92/64 103/61  Pulse: 93 95 83 81  Resp: 19 19 19 16   Temp: (!) 97.4 F (36.3 C) 98.5 F (36.9 C) 97.7 F (36.5 C) 97.9 F (36.6 C)  TempSrc: Oral Oral Oral Oral  SpO2: 100% 96% 93% 93%  Weight:      Height:        Intake/Output Summary (Last 24 hours) at 07/18/2018 1558 Last data filed at 07/18/2018 0755 Gross per 24 hour  Intake 4945 ml  Output -  Net 4945 ml   [employer]  07/15/18 0600 07/16/18 1900 07/16/18 2307  Weight: 83 kg 80.6 kg 78.2 kg    Examination:  General exam: Alert and comfortable Respiratory system: Clear to auscultation bilaterally Cardiovascular system: S1 & S2 heard, RRR. No JVD,  Gastrointestinal system: Abdomen is soft, nontender, nondistended with good bowel sounds Central nervous system: Alert and oriented. No focal neurological deficits. Extremities: Right foot bandaged wound VAC attached Skin: see above.  Psychiatry: Mood & affect  appropriate.     Data Reviewed: I have personally reviewed following labs and imaging studies  CBC: Recent Labs  Lab 07/13/18 1252 07/14/18 0156 07/15/18 1726 07/16/18 1911  WBC 7.3 6.7  --  9.8  NEUTROABS 5.7  --   --   --   HGB 13.4 12.5* 15.3 11.8*  HCT 43.2 41.5 45.0 38.8*  MCV 108.0* 106.4*  --  106.0*  PLT 308 337  --  564   Basic Metabolic Panel: Recent Labs  Lab 07/13/18 1252 07/14/18 0156 07/15/18 1726 07/16/18 1911  NA 136 139 137 136  K 5.1 4.7 5.2* 4.7  CL 97* 97*  --  97*  CO2 23 24  --  21*  GLUCOSE 104* 93 84 144*  BUN 41* 47*  --  60*  CREATININE 9.49* 9.66*  --  11.74*  CALCIUM 9.0 8.9  --  8.3*  PHOS  --   --   --  7.2*   GFR: Estimated Creatinine Clearance: 5.5 mL/min (A) (by C-G formula based on SCr of 11.74 mg/dL (H)). Liver Function Tests: Recent Labs  Lab 07/13/18 1252 07/16/18 1911  AST 19  --   ALT 11  --   ALKPHOS 56  --   BILITOT 0.8  --   PROT 8.1  --   ALBUMIN 3.1* 2.6*   No results for input(s): LIPASE, AMYLASE in the last 168 hours. No results for input(s): AMMONIA in the last 168 hours. Coagulation Profile: No results for input(s): INR, PROTIME in the last 168 hours. Cardiac Enzymes: No results for input(s): CKTOTAL, CKMB, CKMBINDEX, TROPONINI in the last 168 hours. BNP (last 3 results) No results for input(s): PROBNP in the last 8760 hours. HbA1C: No results for input(s): HGBA1C in the last 72 hours. CBG: Recent Labs  Lab 07/17/18 1130 07/17/18 1712 07/17/18 2145 07/18/18 0634 07/18/18 1123  GLUCAP 122* 106* 178* 94 139*   Lipid Profile: No results for input(s): CHOL, HDL, LDLCALC, TRIG, CHOLHDL, LDLDIRECT in the last 72 hours. Thyroid Function Tests: No results for input(s): TSH, T4TOTAL, FREET4, T3FREE, THYROIDAB in the last 72 hours. Anemia Panel: No results for input(s): VITAMINB12, FOLATE, FERRITIN, TIBC, IRON, RETICCTPCT in the last 72 hours. Sepsis Labs: Recent Labs  Lab 07/13/18 1331  07/13/18 1503 07/13/18 1819  LATICACIDVEN 2.75* 2.33* 2.58*    Recent Results (from the past 240 hour(s))  Blood culture (routine x 2)     Status: None   Collection Time: 07/13/18 12:52 PM  Result Value Ref Range Status   Specimen Description BLOOD RIGHT HAND  Final   Special Requests   Final    BOTTLES DRAWN AEROBIC ONLY Blood Culture results may not be optimal due to an inadequate volume of blood received in culture bottles   Culture   Final    NO GROWTH 5 DAYS Performed at [facility], [place] [address] [place], [place] [address]    Report Status 07/18/2018 FINAL  Final  Blood culture (routine x 2)     Status: None  Collection Time: 07/13/18 12:57 PM  Result Value Ref Range Status   Specimen Description BLOOD BLOOD RIGHT FOREARM  Final   Special Requests   Final    BOTTLES DRAWN AEROBIC AND ANAEROBIC Blood Culture results may not be optimal due to an inadequate volume of blood received in culture bottles   Culture   Final    NO GROWTH 5 DAYS Performed at [facility], [place] [address] [place], [place] [address]    Report Status 07/18/2018 FINAL  Final  WOUND CULTURE     Status: Abnormal   Collection Time: 07/13/18  1:24 PM  Result Value Ref Range Status   MICRO NUMBER: [id number]  Final   SPECIMEN QUALITY: Adequate  Final   SOURCE: NOT GIVEN  Final   STATUS: FINAL  Final   GRAM STAIN:   Final    No white blood cells seen No epithelial cells seen Many Gram positive cocci in pairs Many Gram negative bacilli   ISOLATE 1: Enterobacter cloacae complex (A)  Final    Comment: Heavy growth of Enterobacter cloacae complex   ISOLATE 2: Klebsiella oxytoca (A)  Final    Comment: Heavy growth of Klebsiella oxytoca      Susceptibility   Klebsiella oxytoca - AEROBIC CULT, GRAM STAIN NEGATIVE 2    AMOX/CLAVULANIC <=2 Sensitive     AMPICILLIN  Resistant     AMPICILLIN/SULBACTAM <=2 Sensitive     CEFAZOLIN <=4 Not Reportable     CEFEPIME <=1 Sensitive     CEFTRIAXONE <=1  Sensitive     CIPROFLOXACIN <=0.25 Sensitive     LEVOFLOXACIN <=0.12 Sensitive     ERTAPENEM <=0.5 Sensitive     GENTAMICIN <=1 Sensitive     IMIPENEM <=0.25 Sensitive     PIP/TAZO <=4 Sensitive     TOBRAMYCIN <=1 Sensitive     TRIMETH/SULFA* <=20 Sensitive      * For infections other than uncomplicated UTIcaused by E. coli, K. pneumoniae or P. mirabilis:Cefazolin is resistant if MIC > or = 8 mcg/mL.(Distinguishing susceptible versus intermediatefor isolates with MIC < or = 4 mcg/mL requiresadditional testing.)Legend:S = Susceptible  I = IntermediateR = Resistant  NS = Not susceptible* = Not tested  NR = Not reported**NN = See antimicrobic comments   Enterobacter cloacae complex - AEROBIC CULT, GRAM STAIN NEGATIVE 1    AMOX/CLAVULANIC >=32 Resistant     CEFAZOLIN* >=64 Resistant      * For infections other than uncomplicated UTIcaused by E. coli, K. pneumoniae or P. mirabilis:Cefazolin is resistant if MIC > or = 8 mcg/mL.(Distinguishing susceptible versus intermediatefor isolates with MIC < or = 4 mcg/mL requiresadditional testing.)    CEFEPIME <=1 Sensitive     CEFTRIAXONE <=1 Sensitive     CIPROFLOXACIN <=0.25 Sensitive     LEVOFLOXACIN <=0.12 Sensitive     ERTAPENEM <=0.5 Sensitive     GENTAMICIN <=1 Sensitive     IMIPENEM <=0.25 Sensitive     PIP/TAZO <=4 Sensitive     TOBRAMYCIN <=1 Sensitive     TRIMETH/SULFA <=20 Sensitive   Surgical pcr screen     Status: None   Collection Time: 07/13/18  9:11 PM  Result Value Ref Range Status   MRSA, PCR NEGATIVE NEGATIVE Final   Staphylococcus aureus NEGATIVE NEGATIVE Final    Comment: (NOTE) The Xpert SA Assay (FDA approved for NASAL specimens in patients 28 years of age and older), is one component of a comprehensive surveillance program. It is not intended to diagnose  infection nor to guide or monitor treatment. Performed at [facility], [place] [address] [place], [place] [address]   Aerobic/Anaerobic Culture (surgical/deep  wound)     Status: None (Preliminary result)   Collection Time: 07/15/18  6:47 PM  Result Value Ref Range Status   Specimen Description BONE TISSUE  Final   Special Requests METATARSAL BONE FROM RIGHT FOOT  Final   Gram Stain   Final    MODERATE WBC PRESENT,BOTH PMN AND MONONUCLEAR NO ORGANISMS SEEN Performed at [facility], [place] [address] [place], [place] [address]    Culture   Final    RARE ENTEROBACTER CLOACAE RARE ENTEROCOCCUS FAECALIS SUSCEPTIBILITIES TO FOLLOW FOR ORGANISM 2 NO ANAEROBES ISOLATED; CULTURE IN PROGRESS FOR 5 DAYS    Report Status PENDING  Incomplete   Organism ID, Bacteria ENTEROBACTER CLOACAE  Final      Susceptibility   Enterobacter cloacae - MIC*    CEFAZOLIN >=64 RESISTANT Resistant     CEFEPIME <=1 SENSITIVE Sensitive     CEFTAZIDIME <=1 SENSITIVE Sensitive     CEFTRIAXONE <=1 SENSITIVE Sensitive     CIPROFLOXACIN <=0.25 SENSITIVE Sensitive     GENTAMICIN <=1 SENSITIVE Sensitive     IMIPENEM 0.5 SENSITIVE Sensitive     TRIMETH/SULFA <=20 SENSITIVE Sensitive     PIP/TAZO <=4 SENSITIVE Sensitive     * RARE ENTEROBACTER CLOACAE  Aerobic/Anaerobic Culture (surgical/deep wound)     Status: None (Preliminary result)   Collection Time: 07/15/18  6:50 PM  Result Value Ref Range Status   Specimen Description TISSUE  Final   Special Requests SOFT TISSUE RIGHT FOOT  Final   Gram Stain   Final    FEW WBC PRESENT,BOTH PMN AND MONONUCLEAR RARE GRAM NEGATIVE RODS Performed at [facility], [place] [address] [place], [place] [address]    Culture   Final    FEW ENTEROBACTER CLOACAE FEW ENTEROCOCCUS FAECALIS FEW KLEBSIELLA OXYTOCA SUSCEPTIBILITIES TO FOLLOW FOR ORGANISM 3 NO ANAEROBES ISOLATED; CULTURE IN PROGRESS FOR 5 DAYS    Report Status PENDING  Incomplete   Organism ID, Bacteria ENTEROBACTER CLOACAE  Final   Organism ID, Bacteria ENTEROCOCCUS FAECALIS  Final      Susceptibility   Enterobacter cloacae - MIC*    CEFAZOLIN >=64 RESISTANT  Resistant     CEFEPIME <=1 SENSITIVE Sensitive     CEFTAZIDIME <=1 SENSITIVE Sensitive     CEFTRIAXONE <=1 SENSITIVE Sensitive     CIPROFLOXACIN <=0.25 SENSITIVE Sensitive     GENTAMICIN <=1 SENSITIVE Sensitive     IMIPENEM <=0.25 SENSITIVE Sensitive     TRIMETH/SULFA <=20 SENSITIVE Sensitive     PIP/TAZO <=4 SENSITIVE Sensitive     * FEW ENTEROBACTER CLOACAE   Enterococcus faecalis - MIC*    AMPICILLIN <=2 SENSITIVE Sensitive     VANCOMYCIN 1 SENSITIVE Sensitive     GENTAMICIN SYNERGY SENSITIVE Sensitive     * FEW ENTEROCOCCUS FAECALIS         Radiology Studies: Dg Foot 2 Views Right  Result Date: 07/17/2018 CLINICAL DATA:  Fifth toe resection. EXAM: RIGHT FOOT - 2 VIEW COMPARISON:  Right foot x-rays dated July 15, 2018. FINDINGS: Interval resection of the fifth toe. Unchanged partial resection of the fifth metatarsal. Unchanged comminuted intra-articular fracture of the first proximal phalanx head. 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 Bone mineralization is normal. Postsurgical changes in the soft tissues along the lateral forefoot with wound VAC in place. IMPRESSION: 1. Interval amputation  of the fifth toe.  No acute abnormality. 2. Unchanged comminuted fracture of the first proximal phalanx. Electronically Signed   By: Titus Dubin M.D.   On: 07/17/2018 10:59   Vas US Duplex Dialysis Access (avf, Avg)  Result Date: 07/18/2018 DIALYSIS ACCESS Reason for Exam: Non-maturation of AVF. Access Site: Left Upper Extremity. Access Type: Radial-cephalic AVF. Performing Technologist: Landry Mellow RDMS, RVT  Examination Guidelines: A complete evaluation includes B-mode imaging, spectral Doppler, color Doppler, and power Doppler as needed of all accessible portions of each vessel. Unilateral testing is considered an integral part of a complete examination. Limited examinations for reoccurring indications may be performed as noted.  Findings:  +--------------------+----------+-----------------+--------+ AVF                 PSV (cm/s)Flow Vol (mL/min)Comments +--------------------+----------+-----------------+--------+ Native artery inflow   120           427                +--------------------+----------+-----------------+--------+ AVF Anastomosis        400           845                +--------------------+----------+-----------------+--------+  +------------+----------+-------------+----------+--------+ OUTFLOW VEINPSV (cm/s)Diameter (cm)Depth (cm)Describe +------------+----------+-------------+----------+--------+ Prox UA        134                                    +------------+----------+-------------+----------+--------+ Mid UA          92                    0.47            +------------+----------+-------------+----------+--------+ Dist UA        350        0.32        0.51            +------------+----------+-------------+----------+--------+ AC Fossa       400        0.51        0.95            +------------+----------+-------------+----------+--------+   Summary: Patent arteriovenous fistula. *See table(s) above for measurements and observations.  Diagnosing physician: Harold Barban MD Electronically signed by Harold Barban MD on 07/18/2018 at 8:23:05 AM.   --------------------------------------------------------------------------------   Final         Scheduled Meds: . aspirin EC  81 mg Oral Daily  . Chlorhexidine Gluconate Cloth  6 each Topical Q0600  . cinacalcet  30 mg Oral Q breakfast  . feeding supplement (NEPRO CARB STEADY)  237 mL Oral Q24H  . feeding supplement (PRO-STAT SUGAR FREE 64)  30 mL Oral BID WC  . ferric citrate  630 mg Oral TID WC  . gabapentin  100 mg Oral QHS  . [START ON 07/19/2018] heparin  1,000 Units Intravenous Q M,W,F-HD  . insulin aspart  0-15 Units Subcutaneous TID WC  . insulin aspart  0-5 Units Subcutaneous QHS  . lidocaine  20 mL Infiltration Once    . midodrine  10 mg Oral Once per day on Mon Wed Fri  . multivitamin  1 tablet Oral QHS   Continuous Infusions: . sodium chloride 10 mL/hr at 07/15/18 1721  . cefTRIAXone (ROCEPHIN)  IV 2 g (07/18/18 1532)  . metronidazole 500 mg (07/18/18 8938)  . vancomycin 750 mg (07/16/18 2150)  LOS: 5 days    Time spent: 28 minutes.     Hosie Poisson, MD [facility] Pager [phone]   If 7PM-7AM, please contact night-coverage [email or web address] Password [email or web address] 07/18/2018, 3:58 PM

## 2018-07-18 NOTE — Evaluation (Addendum)
Occupational Therapy Evaluation Patient Details Name: Timothy Faulkner MRN: [id number] DOB: 11-Sep-1945 Today's Date: 07/18/2018    History of Present Illness Patient is a 72 y/o male presenting to the ED on 07/13/18 with foot ulcer. I&D of R foot on 07/14/18. Repeat I&D and 5th metatarsal recection with application of wound vac on 07/15/18. Repeat I&D and wound closure on 07/17/18. PMH significant for DM; diabetic foot ulcer/PAD; HTN; HLD; ESRD on MWF HD; and CHF with AICD.    Clinical Impression   This 72 y/o male presents with the above. At baseline pt is mod independent with ADLs and functional mobility using SPC. Pt pleasant and motivated to participate with therapies this AM. Pt requiring minA for room level mobility using RW (+2 utilized for safety/equipment); min cues for maintaining WB status with good carryover throughout. He currently requires setup assist for UB ADL, minA for LB ADLs. Pt fatigued with activity but motivated to return to PLOF. Pt lives with spouse and daughter who he reports can assist with ADLs,iADLs PRN. He will benefit from continued OT services while he remains in acute setting to maximize his overall safety and independence with ADLs and mobility. Will follow.     Follow Up Recommendations  No OT follow up;Supervision/Assistance - 24 hour    Equipment Recommendations  3 in 1 bedside commode           Precautions / Restrictions Precautions Precautions: Fall Required Braces or Orthoses: (post-op shoe) Restrictions Weight Bearing Restrictions: Yes Other Position/Activity Restrictions: WB through heel only      Mobility Bed Mobility Overal bed mobility: Needs Assistance Bed Mobility: Supine to Sit     Supine to sit: Supervision     General bed mobility comments: for line and tube management  Transfers Overall transfer level: Needs assistance Equipment used: Rolling walker (2 wheeled) Transfers: Sit to/from [facility] Sit to Stand:  Min assist Stand pivot transfers: Min assist       General transfer comment: cueing for hand placement, sequencing and safety; attempts to sit prior to full turn to chair increasing his fall risk    Balance Overall balance assessment: Needs assistance Sitting-balance support: No upper extremity supported;Feet supported Sitting balance-Leahy Scale: Good     Standing balance support: Bilateral upper extremity supported;During functional activity Standing balance-Leahy Scale: Poor Standing balance comment: requires RW for dynamic stability                           ADL either performed or assessed with clinical judgement   ADL Overall ADL's : Needs assistance/impaired Eating/Feeding: Modified independent;Sitting   Grooming: Set up;Sitting   Upper Body Bathing: Set up;Sitting   Lower Body Bathing: Min guard;Sit to/from stand   Upper Body Dressing : Set up;Sitting   Lower Body Dressing: Minimal assistance;Sit to/from stand Lower Body Dressing Details (indicate cue type and reason): minA for sit<>stand Toilet Transfer: Minimal assistance;Ambulation;Regular Toilet;RW Toilet Transfer Details (indicate cue type and reason): simulated in transfer to recliner, room level mobility using RW Toileting- Clothing Manipulation and Hygiene: Minimal assistance;Sit to/from stand       Functional mobility during ADLs: Minimal assistance;+2 for safety/equipment;Rolling walker General ADL Comments: pt fatigued with activity but motivated to return to [facility]     Vision         Perception     Praxis      Pertinent Vitals/Pain Pain Assessment: No/denies pain     Hand Dominance Right  Extremity/Trunk Assessment Upper Extremity Assessment Upper Extremity Assessment: Generalized weakness   Lower Extremity Assessment Lower Extremity Assessment: Defer to PT evaluation   Cervical / Trunk Assessment Cervical / Trunk Assessment: Normal   Communication  Communication Communication: No difficulties   Cognition Arousal/Alertness: Awake/alert Behavior During Therapy: WFL for tasks assessed/performed Overall Cognitive Status: Within Functional Limits for tasks assessed                                     General Comments  patient very jovial; morivated to participate with therapies    Exercises     Shoulder Instructions      Home Living Family/patient expects to be discharged to:: Private residence Living Arrangements: Spouse/significant other;Children Available Help at Discharge: Friend(s);Available 24 hours/day Type of Home: House Home Access: Level entry     Home Layout: One level     Bathroom Shower/Tub: Teacher, early years/pre: Standard     Home Equipment: Cane - single point;Shower seat          Prior Functioning/Environment Level of Independence: Independent with assistive device(s)        Comments: would use SPC for steadying; has been taking bird baths due to catheter        OT Problem List: Decreased strength;Decreased range of motion;Decreased knowledge of use of DME or AE;Decreased activity tolerance;Impaired balance (sitting and/or standing)      OT Treatment/Interventions: Self-care/ADL training;Therapeutic exercise;DME and/or AE instruction;Therapeutic activities;Patient/family education;Balance training    OT Goals(Current goals can be found in the care plan section) Acute Rehab OT Goals Patient Stated Goal: return home OT Goal Formulation: With patient Time For Goal Achievement: 08/01/18 Potential to Achieve Goals: Good  OT Frequency: Min 2X/week   Barriers to D/C:            Co-evaluation PT/OT/SLP Co-Evaluation/Treatment: Yes Reason for Co-Treatment: For patient/therapist safety;To address functional/ADL transfers PT goals addressed during session: Mobility/safety with mobility;Balance;Proper use of DME OT goals addressed during session: Proper use of  Adaptive equipment and DME;ADL's and self-care      AM-PAC OT "6 Clicks" Daily Activity     Outcome Measure Help from another person eating meals?: None Help from another person taking care of personal grooming?: None Help from another person toileting, which includes using toliet, bedpan, or urinal?: A Little Help from another person bathing (including washing, rinsing, drying)?: A Little Help from another person to put on and taking off regular upper body clothing?: None Help from another person to put on and taking off regular lower body clothing?: A Little 6 Click Score: 21   End of Session Equipment Utilized During Treatment: Gait belt;Rolling walker Nurse Communication: Mobility status  Activity Tolerance: Patient tolerated treatment well Patient left: in chair;with call bell/phone within reach  OT Visit Diagnosis: Other abnormalities of gait and mobility (R26.89)                Time: 7062-3762 OT Time Calculation (min): 31 min Charges:  OT General Charges $OT Visit: 1 Visit OT Evaluation $OT Eval Moderate Complexity: 1 Mod  Lou Cal, OT [employer] Pager [phone] Office [phone]   Raymondo Band 07/18/2018, 10:19 AM

## 2018-07-19 ENCOUNTER — Encounter (HOSPITAL_COMMUNITY): Payer: Self-pay | Admitting: Radiology

## 2018-07-19 DIAGNOSIS — Z481 Encounter for planned postprocedural wound closure: Secondary | ICD-10-CM

## 2018-07-19 LAB — RENAL FUNCTION PANEL
ANION GAP: 17 — AB (ref 5–15)
Albumin: 2.9 g/dL — ABNORMAL LOW (ref 3.5–5.0)
BUN: 48 mg/dL — ABNORMAL HIGH (ref 8–23)
CO2: 22 mmol/L (ref 22–32)
Calcium: 8.7 mg/dL — ABNORMAL LOW (ref 8.9–10.3)
Chloride: 98 mmol/L (ref 98–111)
Creatinine, Ser: 11.25 mg/dL — ABNORMAL HIGH (ref 0.61–1.24)
GFR calc Af Amer: 5 mL/min — ABNORMAL LOW (ref >60)
GFR, EST NON AFRICAN AMERICAN: 4 mL/min — AB (ref >60)
Glucose, Bld: 114 mg/dL — ABNORMAL HIGH (ref 70–99)
Phosphorus: 6.2 mg/dL — ABNORMAL HIGH (ref 2.5–4.6)
Potassium: 4.3 mmol/L (ref 3.5–5.1)
Sodium: 137 mmol/L (ref 135–145)

## 2018-07-19 LAB — CBC
HCT: 36 % — ABNORMAL LOW (ref 39.0–52.0)
Hemoglobin: 11.2 g/dL — ABNORMAL LOW (ref 13.0–17.0)
MCH: 33.2 pg (ref 26.0–34.0)
MCHC: 31.1 g/dL (ref 30.0–36.0)
MCV: 106.8 fL — ABNORMAL HIGH (ref 80.0–100.0)
Platelets: 209 10*3/uL (ref 150–400)
RBC: 3.37 MIL/uL — AB (ref 4.22–5.81)
RDW: 16.2 % — ABNORMAL HIGH (ref 11.5–15.5)
WBC: 9.5 10*3/uL (ref 4.0–10.5)
nRBC: 0 % (ref 0.0–0.2)

## 2018-07-19 LAB — GLUCOSE, CAPILLARY
GLUCOSE-CAPILLARY: 131 mg/dL — AB (ref 70–99)
GLUCOSE-CAPILLARY: 136 mg/dL — AB (ref 70–99)
Glucose-Capillary: 87 mg/dL (ref 70–99)
Glucose-Capillary: 133 mg/dL — ABNORMAL HIGH (ref 70–99)

## 2018-07-19 MED ORDER — PIPERACILLIN-TAZOBACTAM 3.375 G IVPB
3.3750 g | Freq: Two times a day (BID) | INTRAVENOUS | Status: AC
  Administered 2018-07-20 – 2018-07-22 (×6): 3.375 g via INTRAVENOUS
  Filled 2018-07-19 (×8): qty 50

## 2018-07-19 MED ORDER — LIDOCAINE-PRILOCAINE 2.5-2.5 % EX CREA: 1.0000 "application " | TOPICAL_CREAM | CUTANEOUS | Status: DC | PRN

## 2018-07-19 MED ORDER — PANTOPRAZOLE SODIUM 40 MG PO TBEC
40.0000 mg | DELAYED_RELEASE_TABLET | Freq: Every day | ORAL | Status: DC
  Administered 2018-07-19 – 2018-07-23 (×5): 40 mg via ORAL
  Filled 2018-07-19 (×5): qty 1

## 2018-07-19 MED ORDER — PENTAFLUOROPROP-TETRAFLUOROETH EX AERO: 1.0000 "application " | INHALATION_SPRAY | CUTANEOUS | Status: DC | PRN

## 2018-07-19 MED ORDER — CALCIUM CARBONATE ANTACID 500 MG PO CHEW
CHEWABLE_TABLET | ORAL | Status: AC
  Administered 2018-07-19: 10:00:00
  Filled 2018-07-19: qty 1

## 2018-07-19 MED ORDER — SODIUM CHLORIDE 0.9 % IV SOLN: 100.0000 mL | INTRAVENOUS | Status: DC | PRN

## 2018-07-19 MED ORDER — HEPARIN SODIUM (PORCINE) 1000 UNIT/ML DIALYSIS: 1000.0000 [IU] | INTRAMUSCULAR | Status: DC | PRN

## 2018-07-19 MED ORDER — ALUM & MAG HYDROXIDE-SIMETH 200-200-20 MG/5ML PO SUSP
30.0000 mL | Freq: Once | ORAL | Status: AC
  Administered 2018-07-19: 30 mL via ORAL
  Filled 2018-07-19: qty 30

## 2018-07-19 MED ORDER — HEPARIN SODIUM (PORCINE) 1000 UNIT/ML IJ SOLN
INTRAMUSCULAR | Status: AC
  Filled 2018-07-19: qty 1

## 2018-07-19 MED ORDER — ALTEPLASE 2 MG IJ SOLR: 2.0000 mg | Freq: Once | INTRAMUSCULAR | Status: DC | PRN

## 2018-07-19 MED ORDER — VANCOMYCIN HCL IN DEXTROSE 750-5 MG/150ML-% IV SOLN
INTRAVENOUS | Status: AC
  Administered 2018-07-19: 750 mg via INTRAVENOUS
  Filled 2018-07-19: qty 150

## 2018-07-19 MED ORDER — MIDODRINE HCL 5 MG PO TABS
ORAL_TABLET | ORAL | Status: AC
  Administered 2018-07-19: 10 mg via ORAL
  Filled 2018-07-19: qty 2

## 2018-07-19 MED ORDER — VANCOMYCIN HCL IN DEXTROSE 750-5 MG/150ML-% IV SOLN
750.0000 mg | INTRAVENOUS | Status: DC
  Filled 2018-07-19: qty 150

## 2018-07-19 MED ORDER — LIDOCAINE VISCOUS HCL 2 % MT SOLN
15.0000 mL | Freq: Once | OROMUCOSAL | Status: AC
  Administered 2018-07-19: 15 mL via ORAL
  Filled 2018-07-19: qty 15

## 2018-07-19 MED ORDER — LIDOCAINE HCL (PF) 1 % IJ SOLN: 5.0000 mL | INTRAMUSCULAR | Status: DC | PRN

## 2018-07-19 NOTE — Plan of Care (Signed)
  Problem: Activity: Goal: Risk for activity intolerance will decrease Outcome: Progressing   Problem: Safety: Goal: Ability to remain free from injury will improve Outcome: Progressing   Problem: Skin Integrity: Goal: Risk for impaired skin integrity will decrease Outcome: Progressing   

## 2018-07-19 NOTE — Progress Notes (Addendum)
[facility] Progress Note   Subjective:  Seen while on HD. C/o indigestion which started after eating BBQ chicken yesterday. No dyspnea.  Objective Vitals:   07/19/18 0900 07/19/18 0930 07/19/18 1000 07/19/18 1030  BP: 114/76 118/89 113/75 131/78  Pulse: (!) 104 (!) 105 (!) 105 (!) 105  Resp:      Temp:      TempSrc:      SpO2:      Weight:      Height:       Physical Exam General: Well appearing man, NAD Heart: RRR; no murmur Lungs: CTAB Abdomen: soft, non-tender Extremities: R foot bandaged; no LE edema Dialysis Access: TDC in L chest  Additional Objective Labs: Basic Metabolic Panel: Recent Labs  Lab 07/14/18 0156 07/15/18 1726 07/16/18 1911 07/19/18 0745  NA 139 137 136 137  K 4.7 5.2* 4.7 4.3  CL 97*  --  97* 98  CO2 24  --  21* 22  GLUCOSE 93 84 144* 114*  BUN 47*  --  60* 48*  CREATININE 9.66*  --  11.74* 11.25*  CALCIUM 8.9  --  8.3* 8.7*  PHOS  --   --  7.2* 6.2*   Liver Function Tests: Recent Labs  Lab 07/13/18 1252 07/16/18 1911 07/19/18 0745  AST 19  --   --   ALT 11  --   --   ALKPHOS 56  --   --   BILITOT 0.8  --   --   PROT 8.1  --   --   ALBUMIN 3.1* 2.6* 2.9*   CBC: Recent Labs  Lab 07/13/18 1252 07/14/18 0156 07/15/18 1726 07/16/18 1911 07/19/18 0745  WBC 7.3 6.7  --  9.8 9.5  NEUTROABS 5.7  --   --   --   --   HGB 13.4 12.5* 15.3 11.8* 11.2*  HCT 43.2 41.5 45.0 38.8* 36.0*  MCV 108.0* 106.4*  --  106.0* 106.8*  PLT 308 337  --  224 209   Blood Culture    Component Value Date/Time   SDES TISSUE 07/15/2018 1850   SPECREQUEST SOFT TISSUE RIGHT FOOT 07/15/2018 1850   CULT  07/15/2018 1850    FEW ENTEROBACTER CLOACAE FEW ENTEROCOCCUS FAECALIS FEW KLEBSIELLA OXYTOCA NO ANAEROBES ISOLATED; CULTURE IN PROGRESS FOR 5 DAYS    REPTSTATUS PENDING 07/15/2018 1850   Studies/Results: Vas US Duplex Dialysis Access (avf, Avg)  Result Date: 07/18/2018 DIALYSIS ACCESS Reason for Exam: Non-maturation of AVF.  Access Site: Left Upper Extremity. Access Type: Radial-cephalic AVF. Performing Technologist: Landry Mellow RDMS, RVT  Examination Guidelines: A complete evaluation includes B-mode imaging, spectral Doppler, color Doppler, and power Doppler as needed of all accessible portions of each vessel. Unilateral testing is considered an integral part of a complete examination. Limited examinations for reoccurring indications may be performed as noted.  Findings: +--------------------+----------+-----------------+--------+ AVF                 PSV (cm/s)Flow Vol (mL/min)Comments +--------------------+----------+-----------------+--------+ Native artery inflow   120           427                +--------------------+----------+-----------------+--------+ AVF Anastomosis        400           845                +--------------------+----------+-----------------+--------+  +------------+----------+-------------+----------+--------+ OUTFLOW VEINPSV (cm/s)Diameter (cm)Depth (cm)Describe +------------+----------+-------------+----------+--------+ Prox UA  134                                    +------------+----------+-------------+----------+--------+ Mid UA          92                    0.47            +------------+----------+-------------+----------+--------+ Dist UA        350        0.32        0.51            +------------+----------+-------------+----------+--------+ AC Fossa       400        0.51        0.95            +------------+----------+-------------+----------+--------+   Summary: Patent arteriovenous fistula. *See table(s) above for measurements and observations.  Diagnosing physician: Harold Barban MD Electronically signed by Harold Barban MD on 07/18/2018 at 8:23:05 AM.   --------------------------------------------------------------------------------   Final    Medications: . sodium chloride 10 mL/hr at 07/15/18 1721  . sodium chloride    . sodium chloride     . cefTRIAXone (ROCEPHIN)  IV 2 g (07/18/18 1532)  . metronidazole 500 mg (07/19/18 0407)  . vancomycin 750 mg (07/19/18 1038)   . aspirin EC  81 mg Oral Daily  . Chlorhexidine Gluconate Cloth  6 each Topical Q0600  . cinacalcet  30 mg Oral Q breakfast  . feeding supplement (NEPRO CARB STEADY)  237 mL Oral Q24H  . feeding supplement (PRO-STAT SUGAR FREE 64)  30 mL Oral BID WC  . ferric citrate  630 mg Oral TID WC  . gabapentin  100 mg Oral QHS  . heparin      . heparin  1,000 Units Intravenous Q M,W,F-HD  . insulin aspart  0-15 Units Subcutaneous TID WC  . insulin aspart  0-5 Units Subcutaneous QHS  . lidocaine  20 mL Infiltration Once  . midodrine  10 mg Oral Once per day on Mon Wed Fri  . multivitamin  1 tablet Oral QHS    Dialysis Orders: MWF at [facility] 4 hours, BFR 450, DFR 600, EDW 79.5 kg, 3K/2 calcium, heparin 5000 unit bolus, TDC - Calcitriol 1 mcg PO q HD  Assessment/Plan: 1.R foot osteomyelitis: Wound Cx 12/3 grew Enterobacter & Klebisella. On Vanc/Ceftriaxone/Flagyl. 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. ABIs abnormal. VVS to complete angiogram and possible intervention Tuesday. 2. ESRD: Continue HD per MWF schedule. HD today, no heparin. Slowly maturing AVF. Duplex completed 12/8. VVS to do fistulogram Tuesday. 3. Anemia of CKD: Hgb 11.2, no indication for ESA at this time. 4. Secondary hyperparathyroidism: Ca ok, Phos high (but improving). Continue binders and sensipar. 5. Hypotension/volume:BP soft, on midodrine. Appears euvolemic on exam. Titrate down as tolerated.  6. Nutrition: Alb low, cont protein supplements.  Veneta Penton, PA-C 07/19/2018, 11:18 AM  [facility] Pager: [phone]  Pt seen, examined and agree w A/P as above.  [name] MD [employer] pager [phone]   07/19/2018, 11:31 AM

## 2018-07-19 NOTE — Progress Notes (Signed)
Pharmacy Antibiotic Note  Timothy Faulkner is a 72 y.o. male admitted on 07/13/2018 with DFI. \  Xray shows soft tissue ulceration. No evidence to suggest osteo. ESRD. Afebrile, WBC wnl.  Plan: Vancomycin 750mg  IV QHD MWF Continue ceftriaxone 1g IV Q24h Continue Flagyl 500mg  IV Q8h Monitor clinical picture, HD sessions, VT prn F/U C&S, abx deescalation / LOT  Height: 5\' 8"  (172.7 cm) Weight: 181 lb 10.5 oz (82.4 kg) IBW/kg (Calculated) : 68.4  Temp (24hrs), Avg:98.4 F (36.9 C), Min:97.9 F (36.6 C), Max:99.1 F (37.3 C)  Recent Labs  Lab 07/13/18 1252 07/13/18 1331 07/13/18 1503 07/13/18 1819 07/14/18 0156 07/16/18 1911 07/19/18 0745  WBC 7.3  --   --   --  6.7 9.8 9.5  CREATININE 9.49*  --   --   --  9.66* 11.74* 11.25*  LATICACIDVEN  --  2.75* 2.33* 2.58*  --   --   --     Estimated Creatinine Clearance: 6.2 mL/min (A) (by C-G formula based on SCr of 11.25 mg/dL (H)).    Allergies  Allergen Reactions  . No Known Allergies    Thank you Anette Guarneri, PharmD [phone]  Please check AMION for all [place] numbers  07/19/2018 10:44 AM

## 2018-07-19 NOTE — Progress Notes (Signed)
Dialysis report given to Wallace Cullens, RN. Patient's last glucose 87. Patient refusing snack and drink.

## 2018-07-19 NOTE — Care Management Important Message (Signed)
Important Message  Patient Details  Name: Timothy Faulkner MRN: [id number] Date of Birth: [birth year]   Medicare Important Message Given:  Yes  IM signed/given on 07/16/2018   Orbie Pyo 07/19/2018, 8:22 AM

## 2018-07-19 NOTE — Progress Notes (Signed)
Pharmacy Antibiotic Note  Timothy Faulkner is a 72 y.o. male admitted on 07/13/2018 with DFI.  Xray shows soft tissue ulceration. No evidence to suggest osteo. ESRD. Afebrile, WBC wnl. MD has requested pharmacy change abx from Rocephin/Flagyl to Zosyn.  Plan: Continue Vancomycin 750mg  IV QHD MWF Start Zosyn 3.375 gm IV q12h (4 hour infusion). Monitor clinical picture, HD sessions, VT prn F/U C&S, abx deescalation / LOT  Height: 5\' 8"  (172.7 cm) Weight: 174 lb 2.6 oz (79 kg) IBW/kg (Calculated) : 68.4  Temp (24hrs), Avg:98.3 F (36.8 C), Min:97.2 F (36.2 C), Max:99.1 F (37.3 C)  Recent Labs  Lab 07/13/18 1252 07/13/18 1331 07/13/18 1503 07/13/18 1819 07/14/18 0156 07/16/18 1911 07/19/18 0745  WBC 7.3  --   --   --  6.7 9.8 9.5  CREATININE 9.49*  --   --   --  9.66* 11.74* 11.25*  LATICACIDVEN  --  2.75* 2.33* 2.58*  --   --   --     Estimated Creatinine Clearance: 5.7 mL/min (A) (by C-G formula based on SCr of 11.25 mg/dL (H)).    Allergies  Allergen Reactions  . No Known Allergies     Rebekah Sprinkle, Pharm.D., BCPS Clinical Pharmacist  **Pharmacist phone directory can now be found on [email or web address] ([email or web address]).  Listed under [place].  07/19/2018 6:41 PM

## 2018-07-19 NOTE — Progress Notes (Signed)
Subjective:  Patient ID: Timothy Faulkner, male    DOB: February 08, 1946,  MRN: [id number]  Seen at bedside. Not having much appetite, c/o indigestion. No pedal complaints. Objective:   Vitals:   07/19/18 1130 07/19/18 1442  BP: 115/86 110/69  Pulse: 100 (!) 103  Resp:    Temp: (!) 97.2 F (36.2 C) 98.6 F (37 C)  SpO2: 100% 95%   General AA&O x3. Normal mood and affect.  Vascular Right foot warm Brisk capillary refill to all digits. Pedal hair present.  Neurologic Epicritic sensation grossly intact.  Dermatologic Wound Vac removed. Wound healing well, no warmth, erythema, purulence, no signs of acute infection. Skin edges coapted distally. Open area of wound with retention sutures without failure.  Orthopedic: Motor intact distally.   Results for orders placed or performed during the hospital encounter of 07/13/18 (from the past 24 hour(s))  Glucose, capillary     Status: Abnormal   Collection Time: 07/18/18 10:17 PM  Result Value Ref Range   Glucose-Capillary 121 (H) 70 - 99 mg/dL   Comment 1 Notify RN   Glucose, capillary     Status: None   Collection Time: 07/19/18  6:22 AM  Result Value Ref Range   Glucose-Capillary 87 70 - 99 mg/dL   Comment 1 Notify RN   Renal function panel     Status: Abnormal   Collection Time: 07/19/18  7:45 AM  Result Value Ref Range   Sodium 137 135 - 145 mmol/L   Potassium 4.3 3.5 - 5.1 mmol/L   Chloride 98 98 - 111 mmol/L   CO2 22 22 - 32 mmol/L   Glucose, Bld 114 (H) 70 - 99 mg/dL   BUN 48 (H) 8 - 23 mg/dL   Creatinine, Ser 11.25 (H) 0.61 - 1.24 mg/dL   Calcium 8.7 (L) 8.9 - 10.3 mg/dL   Phosphorus 6.2 (H) 2.5 - 4.6 mg/dL   Albumin 2.9 (L) 3.5 - 5.0 g/dL   GFR calc non Af Amer 4 (L) >60 mL/min   GFR calc Af Amer 5 (L) >60 mL/min   Anion gap 17 (H) 5 - 15  CBC     Status: Abnormal   Collection Time: 07/19/18  7:45 AM  Result Value Ref Range   WBC 9.5 4.0 - 10.5 K/uL   RBC 3.37 (L) 4.22 - 5.81 MIL/uL   Hemoglobin 11.2 (L) 13.0 - 17.0  g/dL   HCT 36.0 (L) 39.0 - 52.0 %   MCV 106.8 (H) 80.0 - 100.0 fL   MCH 33.2 26.0 - 34.0 pg   MCHC 31.1 30.0 - 36.0 g/dL   RDW 16.2 (H) 11.5 - 15.5 %   Platelets 209 150 - 400 K/uL   nRBC 0.0 0.0 - 0.2 %  Glucose, capillary     Status: Abnormal   Collection Time: 07/19/18  1:26 PM  Result Value Ref Range   Glucose-Capillary 131 (H) 70 - 99 mg/dL  Glucose, capillary     Status: Abnormal   Collection Time: 07/19/18  4:46 PM  Result Value Ref Range   Glucose-Capillary 136 (H) 70 - 99 mg/dL   Results for orders placed or performed during the hospital encounter of 07/13/18  Blood culture (routine x 2)     Status: None   Collection Time: 07/13/18 12:52 PM  Result Value Ref Range Status   Specimen Description BLOOD RIGHT HAND  Final   Special Requests   Final    BOTTLES DRAWN AEROBIC ONLY Blood Culture results may  not be optimal due to an inadequate volume of blood received in culture bottles   Culture   Final    NO GROWTH 5 DAYS Performed at [facility], [place] [address] [place], [place] [address]    Report Status 07/18/2018 FINAL  Final  Blood culture (routine x 2)     Status: None   Collection Time: 07/13/18 12:57 PM  Result Value Ref Range Status   Specimen Description BLOOD BLOOD RIGHT FOREARM  Final   Special Requests   Final    BOTTLES DRAWN AEROBIC AND ANAEROBIC Blood Culture results may not be optimal due to an inadequate volume of blood received in culture bottles   Culture   Final    NO GROWTH 5 DAYS Performed at [facility], [place] [address], [place], [place] [address]    Report Status 07/18/2018 FINAL  Final  Surgical pcr screen     Status: None   Collection Time: 07/13/18  9:11 PM  Result Value Ref Range Status   MRSA, PCR NEGATIVE NEGATIVE Final   Staphylococcus aureus NEGATIVE NEGATIVE Final    Comment: (NOTE) The Xpert SA Assay (FDA approved for NASAL specimens in patients 34 years of age and older), is one component of a comprehensive surveillance  program. It is not intended to diagnose infection nor to guide or monitor treatment. Performed at [facility], [place] [address] [place], [place] [address]   Aerobic/Anaerobic Culture (surgical/deep wound)     Status: None (Preliminary result)   Collection Time: 07/15/18  6:47 PM  Result Value Ref Range Status   Specimen Description BONE TISSUE  Final   Special Requests METATARSAL BONE FROM RIGHT FOOT  Final   Gram Stain   Final    MODERATE WBC PRESENT,BOTH PMN AND MONONUCLEAR NO ORGANISMS SEEN    Culture   Final    RARE ENTEROBACTER CLOACAE RARE ENTEROCOCCUS FAECALIS NO ANAEROBES ISOLATED; CULTURE IN PROGRESS FOR 5 DAYS    Report Status PENDING  Incomplete   Organism ID, Bacteria ENTEROBACTER CLOACAE  Final   Organism ID, Bacteria ENTEROCOCCUS FAECALIS  Final      Susceptibility   Enterobacter cloacae - MIC*    CEFAZOLIN >=64 RESISTANT Resistant     CEFEPIME <=1 SENSITIVE Sensitive     CEFTAZIDIME <=1 SENSITIVE Sensitive     CEFTRIAXONE <=1 SENSITIVE Sensitive     CIPROFLOXACIN <=0.25 SENSITIVE Sensitive     GENTAMICIN <=1 SENSITIVE Sensitive     IMIPENEM 0.5 SENSITIVE Sensitive     TRIMETH/SULFA <=20 SENSITIVE Sensitive     PIP/TAZO <=4 SENSITIVE Sensitive     * RARE ENTEROBACTER CLOACAE   Enterococcus faecalis - MIC*    AMPICILLIN <=2 SENSITIVE Sensitive     VANCOMYCIN 1 SENSITIVE Sensitive     GENTAMICIN SYNERGY Value in next row Sensitive      SENSITIVEPerformed at [facility], [address] [place], [place] [address]    * RARE ENTEROCOCCUS FAECALIS  Aerobic/Anaerobic Culture (surgical/deep wound)     Status: None (Preliminary result)   Collection Time: 07/15/18  6:50 PM  Result Value Ref Range Status   Specimen Description TISSUE  Final   Special Requests SOFT TISSUE RIGHT FOOT  Final   Gram Stain   Final    FEW WBC PRESENT,BOTH PMN AND MONONUCLEAR RARE GRAM NEGATIVE RODS Performed at [facility], [place] [address] [place], [place] [address]      Culture   Final  FEW ENTEROBACTER CLOACAE FEW ENTEROCOCCUS FAECALIS FEW KLEBSIELLA OXYTOCA NO ANAEROBES ISOLATED; CULTURE IN PROGRESS FOR 5 DAYS    Report Status PENDING  Incomplete   Organism ID, Bacteria ENTEROBACTER CLOACAE  Final   Organism ID, Bacteria ENTEROCOCCUS FAECALIS  Final   Organism ID, Bacteria KLEBSIELLA OXYTOCA  Final      Susceptibility   Enterobacter cloacae - MIC*    CEFAZOLIN >=64 RESISTANT Resistant     CEFEPIME <=1 SENSITIVE Sensitive     CEFTAZIDIME <=1 SENSITIVE Sensitive     CEFTRIAXONE <=1 SENSITIVE Sensitive     CIPROFLOXACIN <=0.25 SENSITIVE Sensitive     GENTAMICIN <=1 SENSITIVE Sensitive     IMIPENEM <=0.25 SENSITIVE Sensitive     TRIMETH/SULFA <=20 SENSITIVE Sensitive     PIP/TAZO <=4 SENSITIVE Sensitive     * FEW ENTEROBACTER CLOACAE   Enterococcus faecalis - MIC*    AMPICILLIN <=2 SENSITIVE Sensitive     VANCOMYCIN 1 SENSITIVE Sensitive     GENTAMICIN SYNERGY SENSITIVE Sensitive     * FEW ENTEROCOCCUS FAECALIS   Klebsiella oxytoca - MIC*    AMPICILLIN >=32 RESISTANT Resistant     CEFAZOLIN 16 SENSITIVE Sensitive     CEFEPIME <=1 SENSITIVE Sensitive     CEFTAZIDIME <=1 SENSITIVE Sensitive     CEFTRIAXONE <=1 SENSITIVE Sensitive     CIPROFLOXACIN <=0.25 SENSITIVE Sensitive     GENTAMICIN <=1 SENSITIVE Sensitive     IMIPENEM <=0.25 SENSITIVE Sensitive     TRIMETH/SULFA <=20 SENSITIVE Sensitive     AMPICILLIN/SULBACTAM 4 SENSITIVE Sensitive     PIP/TAZO <=4 SENSITIVE Sensitive     Extended ESBL NEGATIVE Sensitive     * FEW KLEBSIELLA OXYTOCA   Assessment & Plan:  Patient was evaluated and treated and all questions answered.  R Foot Infection s/p Resection -Labs reviewed. -Initial path confirmed OM. Clean margin dirty. -Micro with multiple organisms. -Recommend abx per ID. Likely extended abx therapy given residual OM. -Pending arteriogram tomorrow. -Wound bed healing well without continued signs of infection. Will need wound VAC at  d/c. -Reapply wound VAC in AM. -Heel WB to the right foot in surgical shoe.   -Will continue to follow.

## 2018-07-19 NOTE — Progress Notes (Signed)
PROGRESS NOTE    Timothy Faulkner  OEU:[id number] DOB: Oct 29, 1945 DOA: 07/13/2018 PCP: [name], MD    Brief Narrative:  Timothy Faulkner is a 72 y.o. male with medical history significant of DM; diabetic foot ulcer/PAD; HTN; HLD; ESRD on MWF HD; and CHF with AICD presenting with foot pain.  He started with an ulcer that opened on Saturday, although he had a sore there for a few weeks. He was admitted for possible OM of the right foot.   Assessment & Plan:   Principal Problem:   Diabetic foot infection ([place]) Active Problems:   Essential hypertension   Automatic implantable cardioverter-defibrillator in situ   ESRD (end stage renal disease) on dialysis [facility])   Diabetic infection of right foot ([place])   Cardiomyopathy, dilated, nonischemic ([place])   Peripheral arterial disease ([place])   Osteomyelitis of right foot ([place])   Vascular calcification   Cellulitis and abscess of foot, except toes   Acute osteomyelitis of metatarsal bone of right foot ([facility])   Diabetic ulcer of midfoot associated with diabetes mellitus due to underlying condition, with necrosis of bone ([place])   Encounter for planned post-operative wound closure   Osteomyelitis of the right great toe and surrounding cellulitis of the right foot. :  Admitted for IV antibiotics and Dr March Rummage with Podiatry performed incision bone cortex, bone biopsy of the distal and proximal right fifth metatarsal, metatarsectomy, and wound VAC was placed.  He  Underwent wound debridement again on 12/. Wound vac in place.    Wound cultures from 12/3 show enterobacter cloacae complex and klebsiella oxytoca.  In addition to that bone biopsy grew Enterobacter and soft tissues culture grew Enterococcus faecalis. Changed IV rocephin and flagyl to IV zosyn.   ABI's done, they reveal  right: Resting right ankle-brachial index indicates moderate right lower extremity arterial disease. The right toe-brachial index is abnormal.  Left: Resting left  ankle-brachial index indicates moderate left lower extremity arterial disease.  Vascular surgery consulted for abnormal ABIs.  Continue with IV antibiotics and pain control.   Plan for right leg angiogram and left arm fistulogram in the next 24 to 48 hours. .   Physical therapy and Occupational Therapy evaluations ordered.     ESRD on HD.  Further management as per nephrology.  Underwent TDC exchange by IR.  07/15/2018   Hypertension:  Well controlled.    NICM:  Pt currently denies any chest pain or sob.    Mild anemia of chronic disease.  Hemoglobin stable.    Mild hyperkalemia Resolved.    Indigestion/ GERD: protonix added to his regimen.   DVT prophylaxis: heparin.  Subcu Code Status: full code.  Family Communication: none at bedside.  Disposition Plan: Pending further work up.    Consultants:   Podiatry.  Nephrology  Vascular surgery    Procedures: Metatarsectomy, bone biopsy of the fifth metatarsal wound VAC placement on 07/15/2018 by [name]   Antimicrobials: vancomycin , flagyl and rocephin since admission.    Subjective: Pt reports indigestion after eating food.   Objective: Vitals:   07/19/18 1030 07/19/18 1100 07/19/18 1130 07/19/18 1442  BP: 131/78 (!) 108/54 115/86 110/69  Pulse: (!) 105 88 100 (!) 103  Resp:      Temp:   (!) 97.2 F (36.2 C) 98.6 F (37 C)  TempSrc:   Oral Oral  SpO2:   100% 95%  Weight:   79 kg   Height:        Intake/Output Summary (  Last 24 hours) at 07/19/2018 1758 Last data filed at 07/19/2018 1130 Gross per 24 hour  Intake -  Output 3000 ml  Net -3000 ml   Filed Weights   07/16/18 2307 07/19/18 0721 07/19/18 1130  Weight: 78.2 kg 82.4 kg 79 kg    Examination:  General exam: in mild distress from  indigestion  Respiratory system: Clear to auscultation bilaterally, no wheezing or rhonchi.  Cardiovascular system: S1 & S2 heard, RRR. No JVD,  Gastrointestinal system: Abdomen is soft, NT nd  bs+ Central nervous system: Alert and oriented. No focal neurological deficits. Extremities: Right foot bandaged wound VAC attached Skin: see above.  Psychiatry: Mood & affect appropriate.     Data Reviewed: I have personally reviewed following labs and imaging studies  CBC: Recent Labs  Lab 07/13/18 1252 07/14/18 0156 07/15/18 1726 07/16/18 1911 07/19/18 0745  WBC 7.3 6.7  --  9.8 9.5  NEUTROABS 5.7  --   --   --   --   HGB 13.4 12.5* 15.3 11.8* 11.2*  HCT 43.2 41.5 45.0 38.8* 36.0*  MCV 108.0* 106.4*  --  106.0* 106.8*  PLT 308 337  --  224 010   Basic Metabolic Panel: Recent Labs  Lab 07/13/18 1252 07/14/18 0156 07/15/18 1726 07/16/18 1911 07/19/18 0745  NA 136 139 137 136 137  K 5.1 4.7 5.2* 4.7 4.3  CL 97* 97*  --  97* 98  CO2 23 24  --  21* 22  GLUCOSE 104* 93 84 144* 114*  BUN 41* 47*  --  60* 48*  CREATININE 9.49* 9.66*  --  11.74* 11.25*  CALCIUM 9.0 8.9  --  8.3* 8.7*  PHOS  --   --   --  7.2* 6.2*   GFR: Estimated Creatinine Clearance: 5.7 mL/min (A) (by C-G formula based on SCr of 11.25 mg/dL (H)). Liver Function Tests: Recent Labs  Lab 07/13/18 1252 07/16/18 1911 07/19/18 0745  AST 19  --   --   ALT 11  --   --   ALKPHOS 56  --   --   BILITOT 0.8  --   --   PROT 8.1  --   --   ALBUMIN 3.1* 2.6* 2.9*   No results for input(s): LIPASE, AMYLASE in the last 168 hours. No results for input(s): AMMONIA in the last 168 hours. Coagulation Profile: No results for input(s): INR, PROTIME in the last 168 hours. Cardiac Enzymes: No results for input(s): CKTOTAL, CKMB, CKMBINDEX, TROPONINI in the last 168 hours. BNP (last 3 results) No results for input(s): PROBNP in the last 8760 hours. HbA1C: No results for input(s): HGBA1C in the last 72 hours. CBG: Recent Labs  Lab 07/18/18 1632 07/18/18 2217 07/19/18 0622 07/19/18 1326 07/19/18 1646  GLUCAP 146* 121* 87 131* 136*   Lipid Profile: No results for input(s): CHOL, HDL, LDLCALC, TRIG,  CHOLHDL, LDLDIRECT in the last 72 hours. Thyroid Function Tests: No results for input(s): TSH, T4TOTAL, FREET4, T3FREE, THYROIDAB in the last 72 hours. Anemia Panel: No results for input(s): VITAMINB12, FOLATE, FERRITIN, TIBC, IRON, RETICCTPCT in the last 72 hours. Sepsis Labs: Recent Labs  Lab 07/13/18 1331 07/13/18 1503 07/13/18 1819  LATICACIDVEN 2.75* 2.33* 2.58*    Recent Results (from the past 240 hour(s))  Blood culture (routine x 2)     Status: None   Collection Time: 07/13/18 12:52 PM  Result Value Ref Range Status   Specimen Description BLOOD RIGHT HAND  Final   Special  Requests   Final    BOTTLES DRAWN AEROBIC ONLY Blood Culture results may not be optimal due to an inadequate volume of blood received in culture bottles   Culture   Final    NO GROWTH 5 DAYS Performed at [facility], [place] [address], [place], [place] [address]    Report Status 07/18/2018 FINAL  Final  Blood culture (routine x 2)     Status: None   Collection Time: 07/13/18 12:57 PM  Result Value Ref Range Status   Specimen Description BLOOD BLOOD RIGHT FOREARM  Final   Special Requests   Final    BOTTLES DRAWN AEROBIC AND ANAEROBIC Blood Culture results may not be optimal due to an inadequate volume of blood received in culture bottles   Culture   Final    NO GROWTH 5 DAYS Performed at [facility], [place] [address], [place], [place] [address]    Report Status 07/18/2018 FINAL  Final  WOUND CULTURE     Status: Abnormal   Collection Time: 07/13/18  1:24 PM  Result Value Ref Range Status   MICRO NUMBER: [id number]  Final   SPECIMEN QUALITY: Adequate  Final   SOURCE: NOT GIVEN  Final   STATUS: FINAL  Final   GRAM STAIN:   Final    No white blood cells seen No epithelial cells seen Many Gram positive cocci in pairs Many Gram negative bacilli   ISOLATE 1: Enterobacter cloacae complex (A)  Final    Comment: Heavy growth of Enterobacter cloacae complex   ISOLATE 2: Klebsiella oxytoca (A)   Final    Comment: Heavy growth of Klebsiella oxytoca      Susceptibility   Klebsiella oxytoca - AEROBIC CULT, GRAM STAIN NEGATIVE 2    AMOX/CLAVULANIC <=2 Sensitive     AMPICILLIN  Resistant     AMPICILLIN/SULBACTAM <=2 Sensitive     CEFAZOLIN <=4 Not Reportable     CEFEPIME <=1 Sensitive     CEFTRIAXONE <=1 Sensitive     CIPROFLOXACIN <=0.25 Sensitive     LEVOFLOXACIN <=0.12 Sensitive     ERTAPENEM <=0.5 Sensitive     GENTAMICIN <=1 Sensitive     IMIPENEM <=0.25 Sensitive     PIP/TAZO <=4 Sensitive     TOBRAMYCIN <=1 Sensitive     TRIMETH/SULFA* <=20 Sensitive      * For infections other than uncomplicated UTIcaused by E. coli, K. pneumoniae or P. mirabilis:Cefazolin is resistant if MIC > or = 8 mcg/mL.(Distinguishing susceptible versus intermediatefor isolates with MIC < or = 4 mcg/mL requiresadditional testing.)Legend:S = Susceptible  I = IntermediateR = Resistant  NS = Not susceptible* = Not tested  NR = Not reported**NN = See antimicrobic comments   Enterobacter cloacae complex - AEROBIC CULT, GRAM STAIN NEGATIVE 1    AMOX/CLAVULANIC >=32 Resistant     CEFAZOLIN* >=64 Resistant      * For infections other than uncomplicated UTIcaused by E. coli, K. pneumoniae or P. mirabilis:Cefazolin is resistant if MIC > or = 8 mcg/mL.(Distinguishing susceptible versus intermediatefor isolates with MIC < or = 4 mcg/mL requiresadditional testing.)    CEFEPIME <=1 Sensitive     CEFTRIAXONE <=1 Sensitive     CIPROFLOXACIN <=0.25 Sensitive     LEVOFLOXACIN <=0.12 Sensitive     ERTAPENEM <=0.5 Sensitive     GENTAMICIN <=1 Sensitive     IMIPENEM <=0.25 Sensitive     PIP/TAZO <=4 Sensitive     TOBRAMYCIN <=1 Sensitive  TRIMETH/SULFA <=20 Sensitive   Surgical pcr screen     Status: None   Collection Time: 07/13/18  9:11 PM  Result Value Ref Range Status   MRSA, PCR NEGATIVE NEGATIVE Final   Staphylococcus aureus NEGATIVE NEGATIVE Final    Comment: (NOTE) The Xpert SA Assay (FDA approved for  NASAL specimens in patients 20 years of age and older), is one component of a comprehensive surveillance program. It is not intended to diagnose infection nor to guide or monitor treatment. Performed at [facility], [place] [address], [place], [place] [address]   Aerobic/Anaerobic Culture (surgical/deep wound)     Status: None (Preliminary result)   Collection Time: 07/15/18  6:47 PM  Result Value Ref Range Status   Specimen Description BONE TISSUE  Final   Special Requests METATARSAL BONE FROM RIGHT FOOT  Final   Gram Stain   Final    MODERATE WBC PRESENT,BOTH PMN AND MONONUCLEAR NO ORGANISMS SEEN    Culture   Final    RARE ENTEROBACTER CLOACAE RARE ENTEROCOCCUS FAECALIS NO ANAEROBES ISOLATED; CULTURE IN PROGRESS FOR 5 DAYS    Report Status PENDING  Incomplete   Organism ID, Bacteria ENTEROBACTER CLOACAE  Final   Organism ID, Bacteria ENTEROCOCCUS FAECALIS  Final      Susceptibility   Enterobacter cloacae - MIC*    CEFAZOLIN >=64 RESISTANT Resistant     CEFEPIME <=1 SENSITIVE Sensitive     CEFTAZIDIME <=1 SENSITIVE Sensitive     CEFTRIAXONE <=1 SENSITIVE Sensitive     CIPROFLOXACIN <=0.25 SENSITIVE Sensitive     GENTAMICIN <=1 SENSITIVE Sensitive     IMIPENEM 0.5 SENSITIVE Sensitive     TRIMETH/SULFA <=20 SENSITIVE Sensitive     PIP/TAZO <=4 SENSITIVE Sensitive     * RARE ENTEROBACTER CLOACAE   Enterococcus faecalis - MIC*    AMPICILLIN <=2 SENSITIVE Sensitive     VANCOMYCIN 1 SENSITIVE Sensitive     GENTAMICIN SYNERGY Value in next row Sensitive      SENSITIVEPerformed at [facility], [address] [place], [place] [address]    * RARE ENTEROCOCCUS FAECALIS  Aerobic/Anaerobic Culture (surgical/deep wound)     Status: None (Preliminary result)   Collection Time: 07/15/18  6:50 PM  Result Value Ref Range Status   Specimen Description TISSUE  Final   Special Requests SOFT TISSUE RIGHT FOOT  Final   Gram Stain   Final    FEW WBC PRESENT,BOTH PMN AND  MONONUCLEAR RARE GRAM NEGATIVE RODS Performed at [facility], [place] [address], [place], [place] [address]    Culture   Final    FEW ENTEROBACTER CLOACAE FEW ENTEROCOCCUS FAECALIS FEW KLEBSIELLA OXYTOCA NO ANAEROBES ISOLATED; CULTURE IN PROGRESS FOR 5 DAYS    Report Status PENDING  Incomplete   Organism ID, Bacteria ENTEROBACTER CLOACAE  Final   Organism ID, Bacteria ENTEROCOCCUS FAECALIS  Final   Organism ID, Bacteria KLEBSIELLA OXYTOCA  Final      Susceptibility   Enterobacter cloacae - MIC*    CEFAZOLIN >=64 RESISTANT Resistant     CEFEPIME <=1 SENSITIVE Sensitive     CEFTAZIDIME <=1 SENSITIVE Sensitive     CEFTRIAXONE <=1 SENSITIVE Sensitive     CIPROFLOXACIN <=0.25 SENSITIVE Sensitive     GENTAMICIN <=1 SENSITIVE Sensitive     IMIPENEM <=0.25 SENSITIVE Sensitive     TRIMETH/SULFA <=20 SENSITIVE Sensitive     PIP/TAZO <=4 SENSITIVE Sensitive     * FEW ENTEROBACTER CLOACAE   Enterococcus faecalis -  MIC*    AMPICILLIN <=2 SENSITIVE Sensitive     VANCOMYCIN 1 SENSITIVE Sensitive     GENTAMICIN SYNERGY SENSITIVE Sensitive     * FEW ENTEROCOCCUS FAECALIS   Klebsiella oxytoca - MIC*    AMPICILLIN >=32 RESISTANT Resistant     CEFAZOLIN 16 SENSITIVE Sensitive     CEFEPIME <=1 SENSITIVE Sensitive     CEFTAZIDIME <=1 SENSITIVE Sensitive     CEFTRIAXONE <=1 SENSITIVE Sensitive     CIPROFLOXACIN <=0.25 SENSITIVE Sensitive     GENTAMICIN <=1 SENSITIVE Sensitive     IMIPENEM <=0.25 SENSITIVE Sensitive     TRIMETH/SULFA <=20 SENSITIVE Sensitive     AMPICILLIN/SULBACTAM 4 SENSITIVE Sensitive     PIP/TAZO <=4 SENSITIVE Sensitive     Extended ESBL NEGATIVE Sensitive     * FEW KLEBSIELLA OXYTOCA         Radiology Studies: No results found.      Scheduled Meds: . aspirin EC  81 mg Oral Daily  . Chlorhexidine Gluconate Cloth  6 each Topical Q0600  . cinacalcet  30 mg Oral Q breakfast  . feeding supplement (NEPRO CARB STEADY)  237 mL Oral Q24H  . feeding supplement  (PRO-STAT SUGAR FREE 64)  30 mL Oral BID WC  . ferric citrate  630 mg Oral TID WC  . gabapentin  100 mg Oral QHS  . heparin      . heparin  1,000 Units Intravenous Q M,W,F-HD  . insulin aspart  0-15 Units Subcutaneous TID WC  . insulin aspart  0-5 Units Subcutaneous QHS  . lidocaine  20 mL Infiltration Once  . midodrine  10 mg Oral Once per day on Mon Wed Fri  . multivitamin  1 tablet Oral QHS  . pantoprazole  40 mg Oral Q0600   Continuous Infusions: . sodium chloride 10 mL/hr at 07/15/18 1721     LOS: 6 days    Time spent: 30 minutes.     Hosie Poisson, MD [facility] Pager [phone]   If 7PM-7AM, please contact night-coverage [email or web address] Password [email or web address] 07/19/2018, 5:58 PM

## 2018-07-20 ENCOUNTER — Encounter (HOSPITAL_COMMUNITY)
Admission: EM | Disposition: A | Discharge status: Home Health | Payer: Self-pay | Source: Home / Self Care | Attending: Internal Medicine

## 2018-07-20 DIAGNOSIS — N186 End stage renal disease: Secondary | ICD-10-CM

## 2018-07-20 DIAGNOSIS — Z992 Dependence on renal dialysis: Secondary | ICD-10-CM

## 2018-07-20 DIAGNOSIS — T82898A Other specified complication of vascular prosthetic devices, implants and grafts, initial encounter: Secondary | ICD-10-CM

## 2018-07-20 HISTORY — PX: A/V FISTULAGRAM: CATH118298

## 2018-07-20 HISTORY — PX: ABDOMINAL AORTOGRAM W/LOWER EXTREMITY: CATH118223

## 2018-07-20 LAB — GLUCOSE, CAPILLARY
GLUCOSE-CAPILLARY: 71 mg/dL (ref 70–99)
Glucose-Capillary: 75 mg/dL (ref 70–99)
Glucose-Capillary: 116 mg/dL — ABNORMAL HIGH (ref 70–99)

## 2018-07-20 LAB — AEROBIC/ANAEROBIC CULTURE W GRAM STAIN (SURGICAL/DEEP WOUND)

## 2018-07-20 LAB — AEROBIC/ANAEROBIC CULTURE (SURGICAL/DEEP WOUND)

## 2018-07-20 SURGERY — ABDOMINAL AORTOGRAM W/LOWER EXTREMITY
Anesthesia: LOCAL | Laterality: Left

## 2018-07-20 MED ORDER — LIDOCAINE HCL (PF) 1 % IJ SOLN
INTRAMUSCULAR | Status: AC
  Filled 2018-07-20: qty 30

## 2018-07-20 MED ORDER — FENTANYL CITRATE (PF) 100 MCG/2ML IJ SOLN
INTRAMUSCULAR | Status: AC
  Filled 2018-07-20: qty 2

## 2018-07-20 MED ORDER — HYDRALAZINE HCL 20 MG/ML IJ SOLN: 5.0000 mg | INTRAMUSCULAR | Status: DC | PRN

## 2018-07-20 MED ORDER — MIDAZOLAM HCL 2 MG/2ML IJ SOLN
INTRAMUSCULAR | Status: AC
  Filled 2018-07-20: qty 2

## 2018-07-20 MED ORDER — LIDOCAINE HCL (PF) 1 % IJ SOLN
INTRAMUSCULAR | Status: DC | PRN
  Administered 2018-07-20: 15 mL
  Administered 2018-07-20: 5 mL

## 2018-07-20 MED ORDER — LABETALOL HCL 5 MG/ML IV SOLN: 10.0000 mg | INTRAVENOUS | Status: DC | PRN

## 2018-07-20 MED ORDER — DEXTROSE 50 % IV SOLN
INTRAVENOUS | Status: AC
  Administered 2018-07-20: 07:00:00
  Filled 2018-07-20: qty 50

## 2018-07-20 MED ORDER — SODIUM CHLORIDE 0.9 % IV SOLN: 250.0000 mL | INTRAVENOUS | Status: DC | PRN

## 2018-07-20 MED ORDER — ONDANSETRON HCL 4 MG/2ML IJ SOLN: 4.0000 mg | Freq: Four times a day (QID) | INTRAMUSCULAR | Status: DC | PRN

## 2018-07-20 MED ORDER — HEPARIN (PORCINE) IN NACL 1000-0.9 UT/500ML-% IV SOLN
INTRAVENOUS | Status: DC | PRN
  Administered 2018-07-20 (×2): 500 mL

## 2018-07-20 MED ORDER — FENTANYL CITRATE (PF) 100 MCG/2ML IJ SOLN
INTRAMUSCULAR | Status: DC | PRN
  Administered 2018-07-20: 25 ug via INTRAVENOUS

## 2018-07-20 MED ORDER — IODIXANOL 320 MG/ML IV SOLN
INTRAVENOUS | Status: DC | PRN
  Administered 2018-07-20: 180 mL via INTRAVENOUS

## 2018-07-20 MED ORDER — SODIUM CHLORIDE 0.9% FLUSH: 3.0000 mL | INTRAVENOUS | Status: DC | PRN

## 2018-07-20 MED ORDER — ACETAMINOPHEN 325 MG PO TABS
650.0000 mg | ORAL_TABLET | ORAL | Status: DC | PRN
  Administered 2018-07-21: 650 mg via ORAL

## 2018-07-20 MED ORDER — SODIUM CHLORIDE 0.9% FLUSH
3.0000 mL | Freq: Two times a day (BID) | INTRAVENOUS | Status: DC
  Administered 2018-07-20 – 2018-07-23 (×3): 3 mL via INTRAVENOUS

## 2018-07-20 MED ORDER — HEPARIN (PORCINE) IN NACL 1000-0.9 UT/500ML-% IV SOLN
INTRAVENOUS | Status: AC
  Filled 2018-07-20: qty 1000

## 2018-07-20 MED ORDER — HEPARIN SODIUM (PORCINE) 1000 UNIT/ML IJ SOLN
INTRAMUSCULAR | Status: AC
  Filled 2018-07-20: qty 1

## 2018-07-20 MED ORDER — CALCITRIOL 0.25 MCG PO CAPS
1.0000 ug | ORAL_CAPSULE | ORAL | Status: DC
  Administered 2018-07-21 – 2018-07-23 (×2): 1 ug via ORAL

## 2018-07-20 MED ORDER — MIDAZOLAM HCL 2 MG/2ML IJ SOLN
INTRAMUSCULAR | Status: DC | PRN
  Administered 2018-07-20: 0.5 mg via INTRAVENOUS

## 2018-07-20 MED ORDER — HEPARIN SODIUM (PORCINE) 1000 UNIT/ML IJ SOLN
INTRAMUSCULAR | Status: DC | PRN
  Administered 2018-07-20: 8000 [IU] via INTRAVENOUS

## 2018-07-20 SURGICAL SUPPLY — 24 items
BAG SNAP BAND KOVER 36X36 (MISCELLANEOUS) ×1 IMPLANT
BALLN STERLING OTW 3X100X150 (BALLOONS) ×3
BALLOON STERLING OTW 3X100X150 (BALLOONS) IMPLANT
CATH OMNI FLUSH 5F 65CM (CATHETERS) ×1 IMPLANT
CATH QUICKCROSS .018X135CM (MICROCATHETER) ×1 IMPLANT
CLOSURE MYNX CONTROL 6F/7F (Vascular Products) ×1 IMPLANT
COVER DOME SNAP 22 D (MISCELLANEOUS) ×1 IMPLANT
GLIDEWIRE ANGLED NITR .018X260 (WIRE) ×1 IMPLANT
KIT MICROPUNCTURE NIT STIFF (SHEATH) ×1 IMPLANT
PROTECTION STATION PRESSURIZED (MISCELLANEOUS) ×3
SHEATH PINNACLE 5F 10CM (SHEATH) ×1 IMPLANT
SHEATH PINNACLE 6F 10CM (SHEATH) ×1 IMPLANT
SHEATH PINNACLE ST 6F 65CM (SHEATH) ×1 IMPLANT
SHEATH PROBE COVER 6X72 (BAG) ×1 IMPLANT
SHIELD RADPAD SCOOP 12X17 (MISCELLANEOUS) ×1 IMPLANT
STATION PROTECTION PRESSURIZED (MISCELLANEOUS) ×2 IMPLANT
STOPCOCK MORSE 400PSI 3WAY (MISCELLANEOUS) ×3 IMPLANT
SYR MEDRAD MARK V 150ML (SYRINGE) ×1 IMPLANT
TRANSDUCER W/STOPCOCK (MISCELLANEOUS) ×3 IMPLANT
TRAY PV CATH (CUSTOM PROCEDURE TRAY) ×3 IMPLANT
TUBING CIL FLEX 10 FLL-RA (TUBING) ×4 IMPLANT
WIRE BENTSON .035X145CM (WIRE) ×1 IMPLANT
WIRE G V18X300CM (WIRE) ×2 IMPLANT
WIRE ROSEN-J .035X180CM (WIRE) ×1 IMPLANT

## 2018-07-20 NOTE — Plan of Care (Signed)
  Problem: Activity: Goal: Risk for activity intolerance will decrease Outcome: Progressing   Problem: Nutrition: Goal: Adequate nutrition will be maintained Outcome: Progressing   Problem: Safety: Goal: Ability to remain free from injury will improve Outcome: Progressing   Problem: Skin Integrity: Goal: Risk for impaired skin integrity will decrease Outcome: Progressing   

## 2018-07-20 NOTE — Progress Notes (Signed)
OT Cancellation Note  Patient Details Name: Timothy Faulkner MRN: [id number] DOB: [birth year]   Cancelled Treatment:    Reason Eval/Treat Not Completed: Patient at procedure or test/ unavailable(Cath lab). Will return as schedule allows. Thank you.  [place], OTR/L Acute Rehab Pager: [phone] Office: [phone] 07/20/2018, 11:23 AM

## 2018-07-20 NOTE — Progress Notes (Addendum)
Occupational Therapy Treatment Patient Details Name: Timothy Faulkner MRN: [id number] DOB: 1945-08-18 Today's Date: 07/20/2018    History of present illness Patient is a 72 y/o male presenting to the ED on 07/13/18 with foot ulcer. I&D of R foot on 07/14/18. Repeat I&D and 5th metatarsal recection with application of wound vac on 07/15/18. Repeat I&D and wound closure on 07/17/18. PMH significant for DM; diabetic foot ulcer/PAD; HTN; HLD; ESRD on MWF HD; and CHF with AICD.    OT comments  Pt progressing towards established OT goals. Pt agreeable to OOB activity. Pt performing stand pivot to recliner with Min A for balance, safety, and to reduce WB through RLE (heel only) and LUE. Limited activity to stand pivot transfer only since pt's belongings not in his new room (including post-op shoe). Continue to recommend dc home once medically stable and will continue to follow acutely as admitted.    Follow Up Recommendations  No OT follow up;Supervision/Assistance - 24 hour    Equipment Recommendations  3 in 1 bedside commode    Recommendations for Other Services      Precautions / Restrictions Precautions Precautions: Fall Required Braces or Orthoses: Other Brace(post-op shoe) Other Brace: post-op shoe Restrictions Weight Bearing Restrictions: Yes Other Position/Activity Restrictions: WB through heel only       Mobility Bed Mobility Overal bed mobility: Modified Independent Bed Mobility: Supine to Sit     Supine to sit: Supervision     General bed mobility comments: increased time and effort; no physical assist needed  Transfers Overall transfer level: Needs assistance Equipment used: Rolling walker (2 wheeled) Transfers: Sit to/from Stand Sit to Stand: Min assist Stand pivot transfers: Min assist       General transfer comment: Min A for safety and balance. reduced WBing at RLE and focus at weight on heel only     Balance Overall balance assessment: Needs  assistance Sitting-balance support: No upper extremity supported;Feet supported Sitting balance-Leahy Scale: Good     Standing balance support: Bilateral upper extremity supported;During functional activity Standing balance-Leahy Scale: Poor Standing balance comment: Requiring physical A                           ADL either performed or assessed with clinical judgement   ADL Overall ADL's : Needs assistance/impaired                         Toilet Transfer: Minimal assistance;Ambulation;Stand-pivot(simulated to recliner) Toilet Transfer Details (indicate cue type and reason): Min A for balance and to assist with decreased WBing through RLE. Reduced Wbing through LUE as well post cath         Functional mobility during ADLs: Minimal assistance(decreasing pt wbing through RLE and LUE. ) General ADL Comments: Pt performing stand pivot to recliner for simulated toilet transfer. Pt's belongings not in his room including his post-op shoe, so reduced WBing at RLE and focus at weight on heel only     Vision       Perception     Praxis      Cognition Arousal/Alertness: Awake/alert Behavior During Therapy: Flat affect;WFL for tasks assessed/performed Overall Cognitive Status: Within Functional Limits for tasks assessed                                 General Comments: Pt verbalizing frustration with not having his  belongings in his room        Exercises     Shoulder Instructions       General Comments Daughter present throughout    Pertinent Vitals/ Pain       Pain Assessment: No/denies pain  Home Living                                          Prior Functioning/Environment              Frequency  Min 2X/week        Progress Toward Goals  OT Goals(current goals can now be found in the care plan section)  Progress towards OT goals: Progressing toward goals  Acute Rehab OT Goals Patient Stated Goal:  return home OT Goal Formulation: With patient Time For Goal Achievement: 08/01/18 Potential to Achieve Goals: Good ADL Goals Pt Will Perform Grooming: standing;with supervision Pt Will Perform Upper Body Bathing: with modified independence;sitting Pt Will Perform Upper Body Dressing: with modified independence;sitting Pt Will Perform Lower Body Dressing: with modified independence;sit to/from stand Pt Will Transfer to Toilet: with modified independence;ambulating;bedside commode;regular height toilet(BSC over toilet vs regular toilet) Pt Will Perform Toileting - Clothing Manipulation and hygiene: with modified independence;sit to/from stand  Plan Discharge plan remains appropriate    Co-evaluation                 AM-PAC OT "6 Clicks" Daily Activity     Outcome Measure   Help from another person eating meals?: None Help from another person taking care of personal grooming?: None Help from another person toileting, which includes using toliet, bedpan, or urinal?: A Little Help from another person bathing (including washing, rinsing, drying)?: A Little Help from another person to put on and taking off regular upper body clothing?: None Help from another person to put on and taking off regular lower body clothing?: A Little 6 Click Score: 21    End of Session Equipment Utilized During Treatment: Gait belt  OT Visit Diagnosis: Other abnormalities of gait and mobility (R26.89)   Activity Tolerance Patient tolerated treatment well   Patient Left in chair;with call bell/phone within reach;with family/visitor present   Nurse Communication Mobility status        Time: 3335-4562 OT Time Calculation (min): 22 min  Charges: OT General Charges $OT Visit: 1 Visit OT Treatments $Self Care/Home Management : 8-22 mins  Jaymere Alen MSOT, OTR/L Acute Rehab Pager: [phone] Office: [place] 07/20/2018, 5:14 PM

## 2018-07-20 NOTE — Progress Notes (Signed)
PROGRESS NOTE    Timothy Faulkner  YTK:[id number] DOB: [birth year] DOA: 07/13/2018 PCP: [name], MD    Brief Narrative:  Timothy Faulkner is a 72 y.o. male with medical history significant of DM; diabetic foot ulcer/PAD; HTN; HLD; ESRD on MWF HD; and CHF with AICD presenting with foot pain.  He started with an ulcer that opened on Saturday, although he had a sore there for a few weeks. He was admitted for possible OM of the right foot.   Assessment & Plan:   Principal Problem:   Diabetic foot infection ([place]) Active Problems:   Essential hypertension   Automatic implantable cardioverter-defibrillator in situ   ESRD (end stage renal disease) on dialysis [facility])   Diabetic infection of right foot ([place])   Cardiomyopathy, dilated, nonischemic ([place])   Peripheral arterial disease ([place])   Osteomyelitis of right foot ([place])   Vascular calcification   Cellulitis and abscess of foot, except toes   Acute osteomyelitis of metatarsal bone of right foot ([facility])   Diabetic ulcer of midfoot associated with diabetes mellitus due to underlying condition, with necrosis of bone ([place])   Encounter for planned post-operative wound closure   Osteomyelitis of the right great toe and surrounding cellulitis of the right foot. :  Admitted for IV antibiotics and Dr March Rummage with Podiatry performed incision bone cortex, bone biopsy of the distal and proximal right fifth metatarsal, metatarsectomy, and wound VAC was placed.  He  Underwent wound debridement again on 12/7. Wound vac was removed on 12/7 , it will placed back on 12/11.    Wound cultures from 12/3 show enterobacter cloacae complex and klebsiella oxytoca.  In addition to that bone biopsy grew Enterobacter and soft tissues culture grew Enterococcus faecalis. Changed IV rocephin and flagyl to IV zosyn and IV vancomycin to cover the bacteria.   Please obtain ID consult on discharge for antibiotic duration.   ABI's done, they reveal  right: Resting right  ankle-brachial index indicates moderate right lower extremity arterial disease. The right toe-brachial index is abnormal.  Left: Resting left ankle-brachial index indicates moderate left lower extremity arterial disease.  Vascular surgery consulted for abnormal ABIs.  Continue with IV antibiotics and pain control.   Plan for aortogram today and left arm fistulogram today and further recommendations as per vascular.   Physical therapy and Occupational Therapy evaluations ordered.     ESRD on HD.  Further management as per nephrology.  Underwent TDC exchange by IR.  07/15/2018   Hypertension:  Well controlled.    NICM:  Pt currently denies any chest pain or sob.    Mild anemia of chronic disease.  Hemoglobin stable.    Mild hyperkalemia Resolved.    Indigestion/ GERD: protonix added to his regimen.   DVT prophylaxis: heparin.  Subcu Code Status: full code.  Family Communication: none at bedside.  Disposition Plan: Pending further work up.    Consultants:   Podiatry. Dr March Rummage.   Nephrology  Vascular surgery Dr Trula Slade     Procedures: Metatarsectomy, bone biopsy of the fifth metatarsal wound VAC placement on 07/15/2018 by Dr. Marvis Moeller and fistulogram on 12/10   Antimicrobials: vancomycin , and zosyn from 12/10.    Subjective: Indigestion improved.  No chest pain or sob. No pain in the foot.   Objective: Vitals:   07/20/18 1440 07/20/18 1445 07/20/18 1450 07/20/18 1510  BP: (!) 145/75 138/80 (!) 144/81 138/89  Pulse: 80 75 74 77  Resp: 15 15 15  Temp:    97.9 F (36.6 C)  TempSrc:    Oral  SpO2: 97% 98% 97% 96%  Weight:      Height:        Intake/Output Summary (Last 24 hours) at 07/20/2018 1719 Last data filed at 07/20/2018 1500 Gross per 24 hour  Intake 75.5 ml  Output -  Net 75.5 ml   Filed Weights   07/16/18 2307 07/19/18 0721 07/19/18 1130  Weight: 78.2 kg 82.4 kg 79 kg    Examination:  General exam: not in distress.  Comfortable. Respiratory system: good air entry fair. No wheezing or rhonchi.  Cardiovascular system: S1 & S2 heard, RRR. No JVD,  Gastrointestinal system: Abdomen is soft, non distended, non tender , bowel sounds good.  Central nervous system: Alert and oriented. No focal neurological deficits. Extremities: Right foot bandaged wound VAC removed.  Skin: see above.  Psychiatry: Mood & affect appropriate.     Data Reviewed: I have personally reviewed following labs and imaging studies  CBC: Recent Labs  Lab 07/14/18 0156 07/15/18 1726 07/16/18 1911 07/19/18 0745  WBC 6.7  --  9.8 9.5  HGB 12.5* 15.3 11.8* 11.2*  HCT 41.5 45.0 38.8* 36.0*  MCV 106.4*  --  106.0* 106.8*  PLT 337  --  224 219   Basic Metabolic Panel: Recent Labs  Lab 07/14/18 0156 07/15/18 1726 07/16/18 1911 07/19/18 0745  NA 139 137 136 137  K 4.7 5.2* 4.7 4.3  CL 97*  --  97* 98  CO2 24  --  21* 22  GLUCOSE 93 84 144* 114*  BUN 47*  --  60* 48*  CREATININE 9.66*  --  11.74* 11.25*  CALCIUM 8.9  --  8.3* 8.7*  PHOS  --   --  7.2* 6.2*   GFR: Estimated Creatinine Clearance: 5.7 mL/min (A) (by C-G formula based on SCr of 11.25 mg/dL (H)). Liver Function Tests: Recent Labs  Lab 07/16/18 1911 07/19/18 0745  ALBUMIN 2.6* 2.9*   No results for input(s): LIPASE, AMYLASE in the last 168 hours. No results for input(s): AMMONIA in the last 168 hours. Coagulation Profile: No results for input(s): INR, PROTIME in the last 168 hours. Cardiac Enzymes: No results for input(s): CKTOTAL, CKMB, CKMBINDEX, TROPONINI in the last 168 hours. BNP (last 3 results) No results for input(s): PROBNP in the last 8760 hours. HbA1C: No results for input(s): HGBA1C in the last 72 hours. CBG: Recent Labs  Lab 07/19/18 0622 07/19/18 1326 07/19/18 1646 07/19/18 2137 07/20/18 0646  GLUCAP 87 131* 136* 133* 75   Lipid Profile: No results for input(s): CHOL, HDL, LDLCALC, TRIG, CHOLHDL, LDLDIRECT in the last 72  hours. Thyroid Function Tests: No results for input(s): TSH, T4TOTAL, FREET4, T3FREE, THYROIDAB in the last 72 hours. Anemia Panel: No results for input(s): VITAMINB12, FOLATE, FERRITIN, TIBC, IRON, RETICCTPCT in the last 72 hours. Sepsis Labs: Recent Labs  Lab 07/13/18 1819  LATICACIDVEN 2.58*    Recent Results (from the past 240 hour(s))  Blood culture (routine x 2)     Status: None   Collection Time: 07/13/18 12:52 PM  Result Value Ref Range Status   Specimen Description BLOOD RIGHT HAND  Final   Special Requests   Final    BOTTLES DRAWN AEROBIC ONLY Blood Culture results may not be optimal due to an inadequate volume of blood received in culture bottles   Culture   Final    NO GROWTH 5 DAYS Performed at [facility],  [address] [place], [place] [address]    Report Status 07/18/2018 FINAL  Final  Blood culture (routine x 2)     Status: None   Collection Time: 07/13/18 12:57 PM  Result Value Ref Range Status   Specimen Description BLOOD BLOOD RIGHT FOREARM  Final   Special Requests   Final    BOTTLES DRAWN AEROBIC AND ANAEROBIC Blood Culture results may not be optimal due to an inadequate volume of blood received in culture bottles   Culture   Final    NO GROWTH 5 DAYS Performed at [facility], [place] [address] [place], [place] [address]    Report Status 07/18/2018 FINAL  Final  WOUND CULTURE     Status: Abnormal   Collection Time: 07/13/18  1:24 PM  Result Value Ref Range Status   MICRO NUMBER: [id number]  Final   SPECIMEN QUALITY: Adequate  Final   SOURCE: NOT GIVEN  Final   STATUS: FINAL  Final   GRAM STAIN:   Final    No white blood cells seen No epithelial cells seen Many Gram positive cocci in pairs Many Gram negative bacilli   ISOLATE 1: Enterobacter cloacae complex (A)  Final    Comment: Heavy growth of Enterobacter cloacae complex   ISOLATE 2: Klebsiella oxytoca (A)  Final    Comment: Heavy growth of Klebsiella oxytoca      Susceptibility    Klebsiella oxytoca - AEROBIC CULT, GRAM STAIN NEGATIVE 2    AMOX/CLAVULANIC <=2 Sensitive     AMPICILLIN  Resistant     AMPICILLIN/SULBACTAM <=2 Sensitive     CEFAZOLIN <=4 Not Reportable     CEFEPIME <=1 Sensitive     CEFTRIAXONE <=1 Sensitive     CIPROFLOXACIN <=0.25 Sensitive     LEVOFLOXACIN <=0.12 Sensitive     ERTAPENEM <=0.5 Sensitive     GENTAMICIN <=1 Sensitive     IMIPENEM <=0.25 Sensitive     PIP/TAZO <=4 Sensitive     TOBRAMYCIN <=1 Sensitive     TRIMETH/SULFA* <=20 Sensitive      * For infections other than uncomplicated UTIcaused by E. coli, K. pneumoniae or P. mirabilis:Cefazolin is resistant if MIC > or = 8 mcg/mL.(Distinguishing susceptible versus intermediatefor isolates with MIC < or = 4 mcg/mL requiresadditional testing.)Legend:S = Susceptible  I = IntermediateR = Resistant  NS = Not susceptible* = Not tested  NR = Not reported**NN = See antimicrobic comments   Enterobacter cloacae complex - AEROBIC CULT, GRAM STAIN NEGATIVE 1    AMOX/CLAVULANIC >=32 Resistant     CEFAZOLIN* >=64 Resistant      * For infections other than uncomplicated UTIcaused by E. coli, K. pneumoniae or P. mirabilis:Cefazolin is resistant if MIC > or = 8 mcg/mL.(Distinguishing susceptible versus intermediatefor isolates with MIC < or = 4 mcg/mL requiresadditional testing.)    CEFEPIME <=1 Sensitive     CEFTRIAXONE <=1 Sensitive     CIPROFLOXACIN <=0.25 Sensitive     LEVOFLOXACIN <=0.12 Sensitive     ERTAPENEM <=0.5 Sensitive     GENTAMICIN <=1 Sensitive     IMIPENEM <=0.25 Sensitive     PIP/TAZO <=4 Sensitive     TOBRAMYCIN <=1 Sensitive     TRIMETH/SULFA <=20 Sensitive   Surgical pcr screen     Status: None   Collection Time: 07/13/18  9:11 PM  Result Value Ref Range Status   MRSA, PCR NEGATIVE NEGATIVE Final   Staphylococcus aureus NEGATIVE NEGATIVE Final    Comment: (NOTE) The  Xpert SA Assay (FDA approved for NASAL specimens in patients 25 years of age and older), is one component of a  comprehensive surveillance program. It is not intended to diagnose infection nor to guide or monitor treatment. Performed at [facility], [place] [address], [place], [place] [address]   Aerobic/Anaerobic Culture (surgical/deep wound)     Status: None   Collection Time: 07/15/18  6:47 PM  Result Value Ref Range Status   Specimen Description BONE TISSUE  Final   Special Requests METATARSAL BONE FROM RIGHT FOOT  Final   Gram Stain   Final    MODERATE WBC PRESENT,BOTH PMN AND MONONUCLEAR NO ORGANISMS SEEN    Culture   Final    RARE ENTEROBACTER CLOACAE RARE ENTEROCOCCUS FAECALIS NO ANAEROBES ISOLATED Performed at [facility], [place] [address] [place], [place] [address]    Report Status 07/20/2018 FINAL  Final   Organism ID, Bacteria ENTEROBACTER CLOACAE  Final   Organism ID, Bacteria ENTEROCOCCUS FAECALIS  Final      Susceptibility   Enterobacter cloacae - MIC*    CEFAZOLIN >=64 RESISTANT Resistant     CEFEPIME <=1 SENSITIVE Sensitive     CEFTAZIDIME <=1 SENSITIVE Sensitive     CEFTRIAXONE <=1 SENSITIVE Sensitive     CIPROFLOXACIN <=0.25 SENSITIVE Sensitive     GENTAMICIN <=1 SENSITIVE Sensitive     IMIPENEM 0.5 SENSITIVE Sensitive     TRIMETH/SULFA <=20 SENSITIVE Sensitive     PIP/TAZO <=4 SENSITIVE Sensitive     * RARE ENTEROBACTER CLOACAE   Enterococcus faecalis - MIC*    AMPICILLIN <=2 SENSITIVE Sensitive     VANCOMYCIN 1 SENSITIVE Sensitive     GENTAMICIN SYNERGY SENSITIVE Sensitive     * RARE ENTEROCOCCUS FAECALIS  Aerobic/Anaerobic Culture (surgical/deep wound)     Status: None   Collection Time: 07/15/18  6:50 PM  Result Value Ref Range Status   Specimen Description TISSUE  Final   Special Requests SOFT TISSUE RIGHT FOOT  Final   Gram Stain   Final    FEW WBC PRESENT,BOTH PMN AND MONONUCLEAR RARE GRAM NEGATIVE RODS    Culture   Final    FEW ENTEROBACTER CLOACAE FEW ENTEROCOCCUS FAECALIS FEW KLEBSIELLA OXYTOCA NO ANAEROBES ISOLATED Performed at  [facility], [address], [place], [place] [address]    Report Status 07/20/2018 FINAL  Final   Organism ID, Bacteria ENTEROBACTER CLOACAE  Final   Organism ID, Bacteria ENTEROCOCCUS FAECALIS  Final   Organism ID, Bacteria KLEBSIELLA OXYTOCA  Final      Susceptibility   Enterobacter cloacae - MIC*    CEFAZOLIN >=64 RESISTANT Resistant     CEFEPIME <=1 SENSITIVE Sensitive     CEFTAZIDIME <=1 SENSITIVE Sensitive     CEFTRIAXONE <=1 SENSITIVE Sensitive     CIPROFLOXACIN <=0.25 SENSITIVE Sensitive     GENTAMICIN <=1 SENSITIVE Sensitive     IMIPENEM <=0.25 SENSITIVE Sensitive     TRIMETH/SULFA <=20 SENSITIVE Sensitive     PIP/TAZO <=4 SENSITIVE Sensitive     * FEW ENTEROBACTER CLOACAE   Enterococcus faecalis - MIC*    AMPICILLIN <=2 SENSITIVE Sensitive     VANCOMYCIN 1 SENSITIVE Sensitive     GENTAMICIN SYNERGY SENSITIVE Sensitive     * FEW ENTEROCOCCUS FAECALIS   Klebsiella oxytoca - MIC*    AMPICILLIN >=32 RESISTANT Resistant     CEFAZOLIN 16 SENSITIVE Sensitive     CEFEPIME <=1 SENSITIVE Sensitive     CEFTAZIDIME <=1 SENSITIVE  Sensitive     CEFTRIAXONE <=1 SENSITIVE Sensitive     CIPROFLOXACIN <=0.25 SENSITIVE Sensitive     GENTAMICIN <=1 SENSITIVE Sensitive     IMIPENEM <=0.25 SENSITIVE Sensitive     TRIMETH/SULFA <=20 SENSITIVE Sensitive     AMPICILLIN/SULBACTAM 4 SENSITIVE Sensitive     PIP/TAZO <=4 SENSITIVE Sensitive     Extended ESBL NEGATIVE Sensitive     * FEW KLEBSIELLA OXYTOCA         Radiology Studies: No results found.      Scheduled Meds: . aspirin EC  81 mg Oral Daily  . [START ON 07/21/2018] calcitRIOL  1 mcg Oral Once per day on Mon Wed Fri  . Chlorhexidine Gluconate Cloth  6 each Topical Q0600  . cinacalcet  30 mg Oral Q breakfast  . feeding supplement (NEPRO CARB STEADY)  237 mL Oral Q24H  . feeding supplement (PRO-STAT SUGAR FREE 64)  30 mL Oral BID WC  . ferric citrate  630 mg Oral TID WC  . gabapentin  100 mg Oral QHS  . heparin   1,000 Units Intravenous Q M,W,F-HD  . insulin aspart  0-15 Units Subcutaneous TID WC  . insulin aspart  0-5 Units Subcutaneous QHS  . lidocaine  20 mL Infiltration Once  . midodrine  10 mg Oral Once per day on Mon Wed Fri  . multivitamin  1 tablet Oral QHS  . pantoprazole  40 mg Oral Q0600  . sodium chloride flush  3 mL Intravenous Q12H   Continuous Infusions: . sodium chloride 10 mL/hr at 07/15/18 1721  . sodium chloride    . piperacillin-tazobactam (ZOSYN)  IV Stopped (07/20/18 1053)  . [START ON 07/21/2018] vancomycin       LOS: 7 days    Time spent: 30 minutes.     Hosie Poisson, MD [facility] Pager [phone]   If 7PM-7AM, please contact night-coverage [email or web address] Password [email or web address] 07/20/2018, 5:19 PM

## 2018-07-20 NOTE — Progress Notes (Signed)
Physical Therapy Treatment Patient Details Name: Timothy Faulkner MRN: [id number] DOB: April 26, 1946 Today's Date: 07/20/2018    History of Present Illness Patient is a 72 y/o male presenting to the ED on 07/13/18 with foot ulcer. I&D of R foot on 07/14/18. Repeat I&D and 5th metatarsal recection with application of wound vac on 07/15/18. Repeat I&D and wound closure on 07/17/18. PMH significant for DM; diabetic foot ulcer/PAD; HTN; HLD; ESRD on MWF HD; and CHF with AICD.     PT Comments    Patient seen for mobility progression. Pt is making progress toward PT goals and tolerated gait training for 60 ft with RW and min guard for safety. Pt is able to maintain R heel weight bearing only with use of bilat UE support. Current plan remains appropriate.    Follow Up Recommendations  Home health PT;Supervision for mobility/OOB     Equipment Recommendations  Rolling walker with 5" wheels    Recommendations for Other Services OT consult(as ordered)     Precautions / Restrictions Precautions Precautions: Fall Restrictions Weight Bearing Restrictions: Yes Other Position/Activity Restrictions: WB through heel only    Mobility  Bed Mobility Overal bed mobility: Modified Independent Bed Mobility: Supine to Sit           General bed mobility comments: increased time and effort; no physical assist needed  Transfers Overall transfer level: Needs assistance Equipment used: Rolling walker (2 wheeled) Transfers: Sit to/from Stand Sit to Stand: Min assist         General transfer comment: cues for safe hand placement; pt pulling RW and min A required to stabilize RW  Ambulation/Gait Ambulation/Gait assistance: Min guard Gait Distance (Feet): 60 Feet Assistive device: Rolling walker (2 wheeled) Gait Pattern/deviations: Step-to pattern;Decreased stance time - right;Decreased weight shift to right;Antalgic;Decreased step length - left Gait velocity: decreased   General Gait Details:  verbal and visual cues for sequencing and technique for maintaining weight bearing through heel only and verbal cues for safe proximity to [employer]             Wheelchair Mobility    Modified Rankin (Stroke Patients Only)       Balance Overall balance assessment: Needs assistance Sitting-balance support: No upper extremity supported;Feet supported Sitting balance-Leahy Scale: Good     Standing balance support: Bilateral upper extremity supported;During functional activity Standing balance-Leahy Scale: Poor                              Cognition Arousal/Alertness: Awake/alert Behavior During Therapy: Flat affect;WFL for tasks assessed/performed Overall Cognitive Status: Within Functional Limits for tasks assessed                                        Exercises      General Comments        Pertinent Vitals/Pain Pain Assessment: No/denies pain    Home Living                      Prior Function            PT Goals (current goals can now be found in the care plan section) Acute Rehab PT Goals Patient Stated Goal: return home Progress towards PT goals: Progressing toward goals    Frequency    Min 3X/week  PT Plan Current plan remains appropriate    Co-evaluation              AM-PAC PT "6 Clicks" Mobility   Outcome Measure  Help needed turning from your back to your side while in a flat bed without using bedrails?: None Help needed moving from lying on your back to sitting on the side of a flat bed without using bedrails?: None Help needed moving to and from a bed to a chair (including a wheelchair)?: A Little Help needed standing up from a chair using your arms (e.g., wheelchair or bedside chair)?: A Little Help needed to walk in hospital room?: A Little Help needed climbing 3-5 steps with a railing? : A Lot 6 Click Score: 19    End of Session Equipment Utilized During Treatment: Gait  belt Activity Tolerance: Patient tolerated treatment well Patient left: in chair;with call bell/phone within reach Nurse Communication: Mobility status PT Visit Diagnosis: Unsteadiness on feet (R26.81);Other abnormalities of gait and mobility (R26.89);Muscle weakness (generalized) (M62.81)     Time: 2876-8115 PT Time Calculation (min) (ACUTE ONLY): 20 min  Charges:  $Gait Training: 8-22 mins                     Timothy Faulkner, PTA Acute Rehabilitation Services Pager: [phone] Office: [phone]     Timothy Faulkner 07/20/2018, 10:49 AM

## 2018-07-20 NOTE — Progress Notes (Signed)
Pt w/ order for wound vac on R foot, but wound vac was removed 12/9 and gauze and ace wrap bandage in place. Called [name] and verbal order received for wound vac to be placed 12/11 by [place] nurse, wet to dry dressing to be applied to R ft for this evening.   Clyde Canterbury, RN

## 2018-07-20 NOTE — Op Note (Signed)
Patient name: Timothy Faulkner MRN: [id number] DOB: 1946/07/19 Sex: male  07/20/2018 Pre-operative Diagnosis: ESRD, right foot wound Post-operative diagnosis:  Same Surgeon:  Annamarie Major Procedure Performed:  1.  Ultrasound-guided access, left cephalic vein  2.  Fistulogram  3.  Ultrasound-guided access, left femoral artery  4.  Abdominal aortogram  5.  Bilateral lower extremity runoff  6.  Selective catheterization of the anterior tibial and peroneal artery with attempted angioplasty  7.  Closure device (Mynx  8.conscious sedation (72 minutes)   Indications: The patient underwent to debridement/amputation on the right and half was found to have poor bleeding.  He comes in today for angiography for better evaluation of possible invention.  In addition he has a non-maturing left brachiocephalic fistula which will be evaluated.  Procedure:  The patient was identified in the holding area and taken to room 8.  The patient was then placed supine on the table and prepped and draped in the usual sterile fashion.  A time out was called.  Conscious sedation was administered with use of IV fentanyl and Versed under continuous physician and nurse monitoring.  Heart rate, blood pressure, and saturation were continuously monitored.  Ultrasound was used to evaluate the left cephalic vein which was widely patent.  A digital image was acquired.  It was cannulated under ultrasound guidance with a micropuncture needle.  An 0.8 wire was advanced without resistance and the micropuncture sheath was placed.  Contrast injections were performed through the sheath.    Next, ultrasound was used to evaluate the left common femoral artery.  It was patent .  A digital ultrasound image was acquired.  A micropuncture needle was used to access the left common femoral artery under ultrasound guidance.  An 018 wire was advanced without resistance and a micropuncture sheath was placed.  The 018 wire was removed and a  benson wire was placed.  The micropuncture sheath was exchanged for a 5 french sheath.  An omniflush catheter was advanced over the wire to the level of L-1.  An abdominal angiogram was obtained.  Next, the catheter was pulled out the aortic bifurcation and bilateral runoff was performed.  Findings:   Fistulogram: The cephalic vein is patent in the upper arm however the central venous system is occluded secondary to tunneled dialysis catheter and implantable cardiac device.  Aortogram: No significant ostial renal artery stenosis.  The kidneys did not opacify.  The infrarenal abdominal aorta is widely patent.  Bilateral common and external iliac arteries widely patent without stenosis.  Right Lower Extremity: The right common femoral, profundofemoral, and superficial femoral artery are widely patent.  The popliteal arteries widely patent.  There is occlusion of all 3 tibial vessels.  There does appear to be a reconstitution of the peroneal artery which collateralizes to the posterior tibial artery.  Left Lower Extremity: The left common femoral, profundofemoral, and superficial femoral artery are widely patent.  Popliteal artery is widely patent.  There is occlusion of all 3 tibial vessels.  Intervention: After the above images were acquired the decision was made to proceed with intervention.  A 6 French 65 cm sheath was advanced over the aortic bifurcation and placed into the right superficial femoral artery.  The patient was fully heparinized.  I then tried to perform recanalization of the peroneal artery.  This was done with a 3 x 100 Sterling balloon and a V-18 wire and subsequently a 018 quick cross catheter.  I had no ability to push the  wire and catheter down and was unable to perform recanalization.  I then tried to recanalize the anterior tibial artery but had the same problem there was no push ability with the catheter and wire.  Ultimately I stop the procedure.  The patient is very poor options for  revascularization.  The groin was closed with a minx device  Impression:  #1  Occluded central venous system.  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  He has limited options for revascularization.   Theotis Burrow, M.D. [facility] Office: [phone] Pager:  [phone]

## 2018-07-20 NOTE — Progress Notes (Signed)
Pt received from cath lab. VSS. CHG complete. L DP dopplerable. R tibal dopplerable. LG level 0. Pt oriented to room and unit. Will contintue to monitor.  Clyde Canterbury, RN

## 2018-07-20 NOTE — Progress Notes (Signed)
Left dp and pt pulses present with doppler. Right dp present faintly with doppler.

## 2018-07-20 NOTE — Care Management (Signed)
Case manager will follow for discharge needs. Patient is On HD, may need HHPT. Plan is for aortogram on 07/20/18.

## 2018-07-20 NOTE — Progress Notes (Signed)
NPO, blood sugar 75.  Asymptomatic. 1/2amp D50 given per protocol.

## 2018-07-20 NOTE — Progress Notes (Signed)
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  He will need new access, likely a AVGG on the right  Angio revealed severe tibial disease bilaterally.  Unable to cross occlusion from a antegrade approach.  Will plan for retrograde peroneal approach tomorrow.  This is a limb threatening situation.  Npo after midnight  Timothy Faulkner

## 2018-07-20 NOTE — Interval H&P Note (Signed)
History and Physical Interval Note:  07/20/2018 10:32 AM  Timothy Faulkner  has presented today for surgery, with the diagnosis of PVD;ESRD  The various methods of treatment have been discussed with the patient and family. After consideration of risks, benefits and other options for treatment, the patient has consented to  Procedure(s): ABDOMINAL AORTOGRAM W/LOWER EXTREMITY (Right) A/V FISTULAGRAM (Left) as a surgical intervention .  The patient's history has been reviewed, patient examined, no change in status, stable for surgery.  I have reviewed the patient's chart and labs.  Questions were answered to the patient's satisfaction.     Annamarie Major

## 2018-07-20 NOTE — Progress Notes (Signed)
[facility] Progress Note   Subjective:  Seen in room. No CP/dyspnea. Going for arteriogram of leg and fistulogram this morning.   Objective Vitals:   07/19/18 1130 07/19/18 1442 07/19/18 1941 07/20/18 0525  BP: 115/86 110/69 97/65 119/68  Pulse: 100 (!) 103 98 90  Resp:   16 16  Temp: (!) 97.2 F (36.2 C) 98.6 F (37 C) 98.7 F (37.1 C) 98.4 F (36.9 C)  TempSrc: Oral Oral Oral Oral  SpO2: 100% 95% 91% 100%  Weight: 79 kg     Height:       Physical Exam General: Well appearing man, NAD Heart: RRR; no murmur Lungs: CTAB Abdomen: soft, non-tender Extremities: R foot bandaged; no LE edema Dialysis Access: TDC in L chest  Additional Objective Labs: Basic Metabolic Panel: Recent Labs  Lab 07/14/18 0156 07/15/18 1726 07/16/18 1911 07/19/18 0745  NA 139 137 136 137  K 4.7 5.2* 4.7 4.3  CL 97*  --  97* 98  CO2 24  --  21* 22  GLUCOSE 93 84 144* 114*  BUN 47*  --  60* 48*  CREATININE 9.66*  --  11.74* 11.25*  CALCIUM 8.9  --  8.3* 8.7*  PHOS  --   --  7.2* 6.2*   Liver Function Tests: Recent Labs  Lab 07/13/18 1252 07/16/18 1911 07/19/18 0745  AST 19  --   --   ALT 11  --   --   ALKPHOS 56  --   --   BILITOT 0.8  --   --   PROT 8.1  --   --   ALBUMIN 3.1* 2.6* 2.9*   CBC: Recent Labs  Lab 07/13/18 1252 07/14/18 0156 07/15/18 1726 07/16/18 1911 07/19/18 0745  WBC 7.3 6.7  --  9.8 9.5  NEUTROABS 5.7  --   --   --   --   HGB 13.4 12.5* 15.3 11.8* 11.2*  HCT 43.2 41.5 45.0 38.8* 36.0*  MCV 108.0* 106.4*  --  106.0* 106.8*  PLT 308 337  --  224 209   Blood Culture    Component Value Date/Time   SDES TISSUE 07/15/2018 1850   SPECREQUEST SOFT TISSUE RIGHT FOOT 07/15/2018 1850   CULT  07/15/2018 1850    FEW ENTEROBACTER CLOACAE FEW ENTEROCOCCUS FAECALIS FEW KLEBSIELLA OXYTOCA NO ANAEROBES ISOLATED; CULTURE IN PROGRESS FOR 5 DAYS    REPTSTATUS PENDING 07/15/2018 1850   Medications: . sodium chloride 10 mL/hr at 07/15/18 1721   . piperacillin-tazobactam (ZOSYN)  IV 3.375 g (07/20/18 0850)  . [START ON 07/21/2018] vancomycin     . aspirin EC  81 mg Oral Daily  . Chlorhexidine Gluconate Cloth  6 each Topical Q0600  . cinacalcet  30 mg Oral Q breakfast  . feeding supplement (NEPRO CARB STEADY)  237 mL Oral Q24H  . feeding supplement (PRO-STAT SUGAR FREE 64)  30 mL Oral BID WC  . ferric citrate  630 mg Oral TID WC  . gabapentin  100 mg Oral QHS  . heparin  1,000 Units Intravenous Q M,W,F-HD  . insulin aspart  0-15 Units Subcutaneous TID WC  . insulin aspart  0-5 Units Subcutaneous QHS  . lidocaine  20 mL Infiltration Once  . midodrine  10 mg Oral Once per day on Mon Wed Fri  . multivitamin  1 tablet Oral QHS  . pantoprazole  40 mg Oral Q0600    Dialysis Orders: MWF at [facility] 4 hours, BFR 450, DFR 600,  EDW 79.5 kg, 3K/2 calcium, heparin 5000 unit bolus, TDC - Calcitriol 1 mcg PO q HD  Assessment/Plan: 1.R foot osteomyelitis: Wound Cx 12/3 grew Enterobacter& Klebisella.On Vanc/Ceftriaxone/Flagyl. 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. ABIs abnormal. VVS to complete angiogram and possible intervention Tuesday. 2. ESRD: Continue HD per MWF schedule. Next HD 12/11, no heparin. Slowly maturing AVF. Duplex completed 12/8.VVSto do fistulogram Tuesday. 3. Anemia of CKD: Hgb 11.2, no indication for ESA at this time. 4. Secondary hyperparathyroidism: Ca ok, Phos high (but improving). Continue binders and sensipar. 5. Hypotension/volume:BP soft, on midodrine. Appears euvolemic on exam. Titrate down as tolerated.  6. Nutrition: Alb low, cont protein supplements.  Veneta Penton, PA-C 07/20/2018, 9:37 AM  [facility] Pager: [phone]

## 2018-07-21 ENCOUNTER — Encounter (HOSPITAL_COMMUNITY)
Admission: EM | Disposition: A | Discharge status: Home Health | Payer: Self-pay | Source: Home / Self Care | Attending: Internal Medicine

## 2018-07-21 ENCOUNTER — Encounter (HOSPITAL_COMMUNITY): Payer: Self-pay | Admitting: Surgery

## 2018-07-21 HISTORY — PX: LOWER EXTREMITY ANGIOGRAPHY: CATH118251

## 2018-07-21 HISTORY — PX: PERIPHERAL VASCULAR INTERVENTION: CATH118257

## 2018-07-21 LAB — CBC
HCT: 35.5 % — ABNORMAL LOW (ref 39.0–52.0)
Hemoglobin: 10.6 g/dL — ABNORMAL LOW (ref 13.0–17.0)
MCH: 31.8 pg (ref 26.0–34.0)
MCHC: 29.9 g/dL — ABNORMAL LOW (ref 30.0–36.0)
MCV: 106.6 fL — ABNORMAL HIGH (ref 80.0–100.0)
Platelets: 274 10*3/uL (ref 150–400)
RBC: 3.33 MIL/uL — ABNORMAL LOW (ref 4.22–5.81)
RDW: 16.5 % — ABNORMAL HIGH (ref 11.5–15.5)
WBC: 10 10*3/uL (ref 4.0–10.5)
nRBC: 0 % (ref 0.0–0.2)

## 2018-07-21 LAB — RENAL FUNCTION PANEL
Albumin: 2.7 g/dL — ABNORMAL LOW (ref 3.5–5.0)
Anion gap: 16 — ABNORMAL HIGH (ref 5–15)
BUN: 37 mg/dL — ABNORMAL HIGH (ref 8–23)
CO2: 23 mmol/L (ref 22–32)
Calcium: 8.4 mg/dL — ABNORMAL LOW (ref 8.9–10.3)
Chloride: 95 mmol/L — ABNORMAL LOW (ref 98–111)
Creatinine, Ser: 9.5 mg/dL — ABNORMAL HIGH (ref 0.61–1.24)
GFR calc Af Amer: 6 mL/min — ABNORMAL LOW (ref >60)
GFR calc non Af Amer: 5 mL/min — ABNORMAL LOW (ref >60)
Glucose, Bld: 78 mg/dL (ref 70–99)
Phosphorus: 4.8 mg/dL — ABNORMAL HIGH (ref 2.5–4.6)
Potassium: 4 mmol/L (ref 3.5–5.1)
Sodium: 134 mmol/L — ABNORMAL LOW (ref 135–145)

## 2018-07-21 LAB — GLUCOSE, CAPILLARY
Glucose-Capillary: 101 mg/dL — ABNORMAL HIGH (ref 70–99)
Glucose-Capillary: 115 mg/dL — ABNORMAL HIGH (ref 70–99)
Glucose-Capillary: 127 mg/dL — ABNORMAL HIGH (ref 70–99)
Glucose-Capillary: 58 mg/dL — ABNORMAL LOW (ref 70–99)
Glucose-Capillary: 64 mg/dL — ABNORMAL LOW (ref 70–99)
Glucose-Capillary: 72 mg/dL (ref 70–99)
Glucose-Capillary: 95 mg/dL (ref 70–99)

## 2018-07-21 LAB — POCT ACTIVATED CLOTTING TIME: Activated Clotting Time: 252 seconds

## 2018-07-21 SURGERY — LOWER EXTREMITY ANGIOGRAPHY
Anesthesia: LOCAL | Laterality: Right

## 2018-07-21 MED ORDER — ACETAMINOPHEN 325 MG PO TABS
650.0000 mg | ORAL_TABLET | ORAL | Status: DC | PRN
  Administered 2018-07-22: 650 mg via ORAL
  Filled 2018-07-21: qty 2

## 2018-07-21 MED ORDER — ONDANSETRON HCL 4 MG/2ML IJ SOLN: 4.0000 mg | Freq: Four times a day (QID) | INTRAMUSCULAR | Status: DC | PRN

## 2018-07-21 MED ORDER — HEPARIN (PORCINE) IN NACL 1000-0.9 UT/500ML-% IV SOLN
INTRAVENOUS | Status: DC | PRN
  Administered 2018-07-21 (×2): 500 mL

## 2018-07-21 MED ORDER — ACETAMINOPHEN 325 MG PO TABS
ORAL_TABLET | ORAL | Status: AC
  Filled 2018-07-21: qty 2

## 2018-07-21 MED ORDER — DEXTROSE 5 % IV SOLN
INTRAVENOUS | Status: AC
  Filled 2018-07-21: qty 250

## 2018-07-21 MED ORDER — CLOPIDOGREL BISULFATE 75 MG PO TABS
300.0000 mg | ORAL_TABLET | Freq: Once | ORAL | Status: AC
  Administered 2018-07-21: 300 mg via ORAL
  Filled 2018-07-21: qty 4

## 2018-07-21 MED ORDER — HEPARIN (PORCINE) IN NACL 1000-0.9 UT/500ML-% IV SOLN
INTRAVENOUS | Status: AC
  Filled 2018-07-21: qty 1000

## 2018-07-21 MED ORDER — SODIUM CHLORIDE 0.9% FLUSH: 3.0000 mL | INTRAVENOUS | Status: DC | PRN

## 2018-07-21 MED ORDER — SODIUM CHLORIDE 0.9 % IV SOLN: 250.0000 mL | INTRAVENOUS | Status: DC | PRN

## 2018-07-21 MED ORDER — LABETALOL HCL 5 MG/ML IV SOLN: 10.0000 mg | INTRAVENOUS | Status: DC | PRN

## 2018-07-21 MED ORDER — DEXTROSE 50 % IV SOLN
INTRAVENOUS | Status: DC | PRN
  Administered 2018-07-21: 12.5 g via INTRAVENOUS

## 2018-07-21 MED ORDER — LIDOCAINE HCL (PF) 1 % IJ SOLN
INTRAMUSCULAR | Status: DC | PRN
  Administered 2018-07-21: 2 mL via INTRADERMAL
  Administered 2018-07-21: 15 mL via INTRADERMAL

## 2018-07-21 MED ORDER — HEPARIN SODIUM (PORCINE) 1000 UNIT/ML IJ SOLN
INTRAMUSCULAR | Status: AC
  Administered 2018-07-21: 4200 [IU] via INTRAVENOUS
  Filled 2018-07-21: qty 5

## 2018-07-21 MED ORDER — HEPARIN SODIUM (PORCINE) 1000 UNIT/ML IJ SOLN
INTRAMUSCULAR | Status: AC
  Filled 2018-07-21: qty 1

## 2018-07-21 MED ORDER — CLOPIDOGREL BISULFATE 75 MG PO TABS
75.0000 mg | ORAL_TABLET | Freq: Every day | ORAL | Status: DC
  Administered 2018-07-22 – 2018-07-23 (×2): 75 mg via ORAL
  Filled 2018-07-21 (×2): qty 1

## 2018-07-21 MED ORDER — HYDRALAZINE HCL 20 MG/ML IJ SOLN: 5.0000 mg | INTRAMUSCULAR | Status: DC | PRN

## 2018-07-21 MED ORDER — CALCITRIOL 0.5 MCG PO CAPS
ORAL_CAPSULE | ORAL | Status: AC
  Administered 2018-07-21: 1 ug via ORAL
  Filled 2018-07-21: qty 2

## 2018-07-21 MED ORDER — LIDOCAINE HCL (PF) 1 % IJ SOLN
INTRAMUSCULAR | Status: AC
  Filled 2018-07-21: qty 30

## 2018-07-21 MED ORDER — SODIUM CHLORIDE 0.9% FLUSH
3.0000 mL | Freq: Two times a day (BID) | INTRAVENOUS | Status: DC
  Administered 2018-07-21 – 2018-07-23 (×3): 3 mL via INTRAVENOUS

## 2018-07-21 MED ORDER — DEXTROSE 50 % IV SOLN
INTRAVENOUS | Status: AC
  Filled 2018-07-21: qty 50

## 2018-07-21 MED ORDER — DEXTROSE 50 % IV SOLN
INTRAVENOUS | Status: AC
  Administered 2018-07-21: 25 mL
  Filled 2018-07-21: qty 50

## 2018-07-21 MED ORDER — MIDODRINE HCL 5 MG PO TABS
ORAL_TABLET | ORAL | Status: AC
  Administered 2018-07-21: 10 mg via ORAL
  Filled 2018-07-21: qty 2

## 2018-07-21 MED ORDER — IODIXANOL 320 MG/ML IV SOLN
INTRAVENOUS | Status: DC | PRN
  Administered 2018-07-21: 80 mL via INTRA_ARTERIAL

## 2018-07-21 MED ORDER — HEPARIN SODIUM (PORCINE) 1000 UNIT/ML IJ SOLN
INTRAMUSCULAR | Status: DC | PRN
  Administered 2018-07-21: 8000 [IU] via INTRAVENOUS

## 2018-07-21 SURGICAL SUPPLY — 32 items
BAG SNAP BAND KOVER 36X36 (MISCELLANEOUS) ×1 IMPLANT
BALLN STERLING OTW 3X220X150 (BALLOONS) ×2
BALLOON STERLING OTW 3X220X150 (BALLOONS) IMPLANT
CATH CXI 2.3F 150 ST (CATHETERS) IMPLANT
CATH CXI 2.6F 65 ST (CATHETERS) ×4
CATH CXI SUPP 2.6F 150 ANG (CATHETERS) ×1 IMPLANT
CATH OMNI FLUSH 5F 65CM (CATHETERS) ×1 IMPLANT
CATH QUICKCROSS SUPP .035X90CM (MICROCATHETER) ×1 IMPLANT
CATH SPRT STRG 65X2.6FR ACPT (CATHETERS) IMPLANT
CLOSURE MYNX CONTROL 6F/7F (Vascular Products) ×1 IMPLANT
COVER DOME SNAP 22 D (MISCELLANEOUS) ×1 IMPLANT
DEVICE ONE SNARE 10MM (MISCELLANEOUS) ×1 IMPLANT
GLIDEWIRE ANGLED NITR .018X260 (WIRE) ×1 IMPLANT
KIT ENCORE 26 ADVANTAGE (KITS) ×1 IMPLANT
KIT MICROPUNCTURE NIT STIFF (SHEATH) ×1 IMPLANT
KIT PV (KITS) ×2 IMPLANT
PATCH THROMBIX TOPICAL PLAIN (HEMOSTASIS) ×1 IMPLANT
SHEATH FLEX ANSEL ANG 6F 45CM (SHEATH) ×1 IMPLANT
SHEATH MICROPUNCTURE PEDAL 4FR (SHEATH) ×2 IMPLANT
SHEATH PINNACLE 5F 10CM (SHEATH) ×1 IMPLANT
SHEATH PINNACLE 6F 10CM (SHEATH) ×1 IMPLANT
SHEATH PROBE COVER 6X72 (BAG) ×1 IMPLANT
STENT SYNERGY DES 3.5X38 (Permanent Stent) ×1 IMPLANT
STENT SYNERGY DES 3X38 (Permanent Stent) ×2 IMPLANT
SYR MEDRAD MARK V 150ML (SYRINGE) ×1 IMPLANT
TRANSDUCER W/STOPCOCK (MISCELLANEOUS) ×2 IMPLANT
TRAY PV CATH (CUSTOM PROCEDURE TRAY) ×2 IMPLANT
WIRE BENTSON .035X145CM (WIRE) ×1 IMPLANT
WIRE G V18X300CM (WIRE) ×4 IMPLANT
WIRE ROSEN-J .035X260CM (WIRE) ×1 IMPLANT
WIRE SPARTACORE .014X300CM (WIRE) ×1 IMPLANT
WIRE TORQFLEX AUST .018X40CM (WIRE) ×1 IMPLANT

## 2018-07-21 NOTE — Progress Notes (Addendum)
[facility] Progress Note   Subjective:  Seen on HD today, 2L UF goal. Now being called early for surgery, so HD will be shortened today. Denies CP/dyspnea.  Objective Vitals:   07/20/18 1445 07/20/18 1450 07/20/18 1510 07/21/18 0420  BP: 138/80 (!) 144/81 138/89 109/70  Pulse: 75 74 77 89  Resp: 15 15  15   Temp:   97.9 F (36.6 C) 97.8 F (36.6 C)  TempSrc:   Oral Oral  SpO2: 98% 97% 96% 93%  Weight:    79.9 kg  Height:       Physical Exam General:Well appearing man, NAD Heart:RRR; no murmur Lungs:CTAB Abdomen:soft, non-tender Extremities:R foot bandaged; no LE edema Dialysis Access:TDC in L chest  Additional Objective Labs: Basic Metabolic Panel: Recent Labs  Lab 07/16/18 1911 07/19/18 0745 07/21/18 0755  NA 136 137 134*  K 4.7 4.3 4.0  CL 97* 98 95*  CO2 21* 22 23  GLUCOSE 144* 114* 78  BUN 60* 48* 37*  CREATININE 11.74* 11.25* 9.50*  CALCIUM 8.3* 8.7* 8.4*  PHOS 7.2* 6.2* 4.8*   Liver Function Tests: Recent Labs  Lab 07/16/18 1911 07/19/18 0745 07/21/18 0755  ALBUMIN 2.6* 2.9* 2.7*   CBC: Recent Labs  Lab 07/16/18 1911 07/19/18 0745 07/21/18 0755  WBC 9.8 9.5 10.0  HGB 11.8* 11.2* 10.6*  HCT 38.8* 36.0* 35.5*  MCV 106.0* 106.8* 106.6*  PLT 224 209 274   Blood Culture    Component Value Date/Time   SDES TISSUE 07/15/2018 1850   SPECREQUEST SOFT TISSUE RIGHT FOOT 07/15/2018 1850   CULT  07/15/2018 1850    FEW ENTEROBACTER CLOACAE FEW ENTEROCOCCUS FAECALIS FEW KLEBSIELLA OXYTOCA NO ANAEROBES ISOLATED Performed at [facility], [place] [address] [place], [place] [address]    REPTSTATUS 07/20/2018 FINAL 07/15/2018 1850   Medications: . sodium chloride 10 mL/hr at 07/15/18 1721  . sodium chloride    . piperacillin-tazobactam (ZOSYN)  IV 3.375 g (07/20/18 2209)  . vancomycin     . heparin      . acetaminophen      . aspirin EC  81 mg Oral Daily  . calcitRIOL  1 mcg Oral Once per day on Mon Wed Fri  .  Chlorhexidine Gluconate Cloth  6 each Topical Q0600  . cinacalcet  30 mg Oral Q breakfast  . feeding supplement (NEPRO CARB STEADY)  237 mL Oral Q24H  . feeding supplement (PRO-STAT SUGAR FREE 64)  30 mL Oral BID WC  . ferric citrate  630 mg Oral TID WC  . gabapentin  100 mg Oral QHS  . heparin  1,000 Units Intravenous Q M,W,F-HD  . insulin aspart  0-15 Units Subcutaneous TID WC  . insulin aspart  0-5 Units Subcutaneous QHS  . lidocaine  20 mL Infiltration Once  . midodrine  10 mg Oral Once per day on Mon Wed Fri  . multivitamin  1 tablet Oral QHS  . pantoprazole  40 mg Oral Q0600  . sodium chloride flush  3 mL Intravenous Q12H    Dialysis Orders: MWF at [facility] 4 hours, BFR 450, DFR 600, EDW 79.5 kg, 3K/2 calcium, heparin 5000 unit bolus, TDC - Calcitriol 1 mcgPO q HD  Assessment/Plan: 1.R foot osteomyelitis: Wound Cx12/3 grew Enterobacter &Klebisella.OnVanc/Ceftriaxone/Flagyl.S/p 5th metatarsal amputation and debridement12/7 &additional biopsy L 5th phalanx, delayed wound closure and toe flap12/7by [name].ABIs abnormal, LE runoff angiogram showed B severe tibial occlusions - for further intervention today. 2. ESRD: Continue HD per  MWF schedule. Next HD 12/11, no heparin. Slowly maturing AVF.Duplex completed 12/8.VVS completed f'gram 12/11 - occluded L central vessels, will need AVG on R side in future, per notes. 3. Anemia of CKD: Hgb 10.6, will plan low dose Aranesp with next HD. 4. Secondary hyperparathyroidism: Ca/Phos controlled now.Continue binders and sensipar. 5. Hypotension/volume:BP soft, on midodrine. Appears euvolemic on exam. Titrate down as tolerated.  6. Nutrition:Alb low, cont protein supplements. 7. PAD: See above. For R retrograde tibial angioplasty today.  Veneta Penton, PA-C 07/21/2018, 9:49 AM  [facility] Pager: [phone]  Pt seen, examined and agree w A/P as above.  [name] MD [employer] pager [phone]   07/21/2018, 12:19 PM

## 2018-07-21 NOTE — Progress Notes (Signed)
Hypoglycemic Event  CBG: 64  Treatment: D50 25 mL (12.5 gm)  Symptoms: None  Follow-up CBG: Time: 4:45 CBG Result:127  Possible Reasons for Event: Inadequate meal intake      Timothy Faulkner M

## 2018-07-21 NOTE — Progress Notes (Signed)
Physical Therapy Treatment Patient Details Name: Timothy Faulkner MRN: [id number] DOB: [birth year] Today's Date: 07/21/2018    History of Present Illness Patient is a 72 y/o male presenting to the ED on 07/13/18 with foot ulcer. I&D of R foot on 07/14/18. Repeat I&D and 5th metatarsal recection with application of wound vac on 07/15/18. Repeat I&D and wound closure on 07/17/18. PMH significant for DM; diabetic foot ulcer/PAD; HTN; HLD; ESRD on MWF HD; and CHF with AICD.     PT Comments    Patient demonstrating good carryover of maintenance of weightbearing precautions and gait sequencing. Requiring min assist for transfers and min guard for room ambulation using walker. BP post mobility 93/59. Continues with balance deficits. D/c plan remains appropriate.     Follow Up Recommendations  Home health PT;Supervision for mobility/OOB     Equipment Recommendations  Rolling walker with 5" wheels    Recommendations for Other Services       Precautions / Restrictions Precautions Precautions: Fall Precaution Comments: wound vac Required Braces or Orthoses: Other Brace Other Brace: post-op shoe Restrictions Weight Bearing Restrictions: Yes Other Position/Activity Restrictions: WB through heel only    Mobility  Bed Mobility Overal bed mobility: Needs Assistance Bed Mobility: Supine to Sit     Supine to sit: Min assist     General bed mobility comments: pt seeking external support from PT via handheld assist  Transfers Overall transfer level: Needs assistance Equipment used: Rolling walker (2 wheeled) Transfers: Sit to/from Stand Sit to Stand: Min assist         General transfer comment: light min assist to boost up to standing  Ambulation/Gait Ambulation/Gait assistance: Min guard Gait Distance (Feet): 30 Feet Assistive device: Rolling walker (2 wheeled) Gait Pattern/deviations: Step-to pattern;Decreased stance time - right;Decreased weight shift to right;Antalgic;Decreased  step length - left Gait velocity: decreased Gait velocity interpretation: <1.8 ft/sec, indicate of risk for recurrent falls General Gait Details: patient requiring min cues for sequencing and weightbearing status.    Stairs             Wheelchair Mobility    Modified Rankin (Stroke Patients Only)       Balance Overall balance assessment: Needs assistance Sitting-balance support: No upper extremity supported;Feet supported Sitting balance-Leahy Scale: Good     Standing balance support: Bilateral upper extremity supported;During functional activity Standing balance-Leahy Scale: Poor                              Cognition Arousal/Alertness: Awake/alert Behavior During Therapy: Flat affect;WFL for tasks assessed/performed Overall Cognitive Status: Within Functional Limits for tasks assessed                                        Exercises General Exercises - Lower Extremity Long Arc Quad: 10 reps;Both;Seated Hip ABduction/ADduction: 10 reps;Seated;Other (comment)(isometric) Hip Flexion/Marching: 10 reps;Both;Seated    General Comments        Pertinent Vitals/Pain Pain Assessment: No/denies pain    Home Living                      Prior Function            PT Goals (current goals can now be found in the care plan section) Acute Rehab PT Goals Patient Stated Goal: return home Potential to Achieve Goals: Good Progress  towards PT goals: Progressing toward goals    Frequency    Min 3X/week      PT Plan Current plan remains appropriate    Co-evaluation              AM-PAC PT "6 Clicks" Mobility   Outcome Measure  Help needed turning from your back to your side while in a flat bed without using bedrails?: None Help needed moving from lying on your back to sitting on the side of a flat bed without using bedrails?: None Help needed moving to and from a bed to a chair (including a wheelchair)?: A Little Help  needed standing up from a chair using your arms (e.g., wheelchair or bedside chair)?: A Little Help needed to walk in hospital room?: A Little Help needed climbing 3-5 steps with a railing? : A Lot 6 Click Score: 19    End of Session Equipment Utilized During Treatment: Gait belt Activity Tolerance: Patient tolerated treatment well Patient left: with call bell/phone within reach;in bed Nurse Communication: Mobility status PT Visit Diagnosis: Unsteadiness on feet (R26.81);Other abnormalities of gait and mobility (R26.89);Muscle weakness (generalized) (M62.81)     Time: 5993-5701 PT Time Calculation (min) (ACUTE ONLY): 21 min  Charges:  $Gait Training: 8-22 mins                     Timothy Faulkner, [place], DPT Acute Rehabilitation Services Pager [phone] Office [phone]   Timothy Faulkner 07/21/2018, 5:18 PM

## 2018-07-21 NOTE — Plan of Care (Signed)
  Problem: Nutrition: Goal: Adequate nutrition will be maintained Outcome: Progressing   Problem: Coping: Goal: Level of anxiety will decrease Outcome: Progressing   

## 2018-07-21 NOTE — Consult Note (Signed)
[facility] Nurse wound consult note Supplies have been ordered to place the [facility] on the patient.  He is currently in hemodialysis, and I understand after speaking with his primary RN, that he is to go to OR post dialysis.  If possible, the NPWT will be applied today. Val Riles, RN, MSN, CWOCN, CNS-BC, pager [phone]

## 2018-07-21 NOTE — Progress Notes (Signed)
[facility] for retrograde tibial access today - likely peroneal.   Objective 109/70 89 97.8 F (36.6 C) (Oral) 15 93%  Intake/Output Summary (Last 24 hours) at 07/21/2018 0941 Last data filed at 07/20/2018 1844 Gross per 24 hour  Intake 315.5 ml  Output -  Net 315.5 ml      Laboratory Lab Results: Recent Labs    07/19/18 0745 07/21/18 0755  WBC 9.5 10.0  HGB 11.2* 10.6*  HCT 36.0* 35.5*  PLT 209 274   BMET Recent Labs    07/19/18 0745 07/21/18 0755  NA 137 134*  K 4.3 4.0  CL 98 95*  CO2 22 23  GLUCOSE 114* 78  BUN 48* 37*  CREATININE 11.25* 9.50*  CALCIUM 8.7* 8.4*    COAG Lab Results  Component Value Date   INR 1.24 03/12/2013   INR 1.27 08/24/2012   INR 0.9 ratio 06/21/2010   No results found for: PTT  Assessment/Planning:  Retrograde tibial access on right today - likely peroneal for CLI with tissue loss.   [name] 07/21/2018 9:41 AM --

## 2018-07-21 NOTE — Op Note (Signed)
Patient name: Timothy Faulkner MRN: [id number] DOB: 12-Oct-1945 Sex: male  07/21/2018 Pre-operative Diagnosis: Critical limb ischemia of the right lower extremity with tissue loss Post-operative diagnosis:  Same Surgeon:  [name], MD Assistant: Servando Snare, MD Procedure Performed: 1.  Ultrasound-guided access of the left common femoral artery 2.  Right lower extremity arteriogram with selection of tertiary branches 3.  Fluoroscopic retrograde cannulation of the right peroneal artery 4.  Right below-knee popliteal artery, tibioperoneal trunk, and peroneal artery angioplasty (3 mm x 220 mm Sterling) 5.  Right tibioperoneal trunk and peroneal artery balloon expandable stent placement (3.5 mm x 38 mm Synergy coronary stent, 3.0 mm x 38 mm Synergy coronary stent, and 3.0 mm x 38 mm Synergy coronary stent) 6.  Mynx closure of the left common femoral artery  Indications: Patient is a 72 year old male with multiple medical comorbidities who recently presented with tissue loss of his right lower extremity.  He underwent aortogram with right lower extremity arteriogram yesterday by Dr. Trula Slade and had severe three-vessel tibial disease that was unable to be crossed in antegrade fashion.  He presents today for an attempt at retrograde tibial access after risks and benefits were discussed.  Findings: Successful fluoroscopic cannulation of the right peroneal artery at the ankle in retrograde fashion.  After flossing a wire over his aortic bifurcation in the left common femoral artery via snare, the peroneal, tibioperoneal trunk, and below-knee popliteal artery were angioplastied with a 3 mm angioplasty balloon.  We did have a dissection from retrograde approach in the peroneal and tibioperoneal trunk that was stented with 3.5 and 3.0 mm Synergy coronary stents.  We did not stent the below-knee popliteal artery where there was also a dissection given that we felt we had reentered the true lumen  below this dissection and it did not appear flow-limiting.  At the completion of the case patient had patent flow down the peroneal artery with a collateral filling the posterior tibial at the ankle and filling of the plantar arch.  He also had a brisk peroneal signal.   Procedure:  The patient was identified in the holding area and taken to room 8.  The patient was then placed supine on the table and prepped and draped in the usual sterile fashion.  A time out was called.  Ultrasound was used to evaluate the left common femoral artery.  It was patent .  A digital ultrasound image was acquired.  A micropuncture needle was used to access the left common femoral artery under ultrasound guidance.  An 018 wire was advanced without resistance and a micropuncture sheath was placed.  The 018 wire was removed and a benson wire was placed.  The micropuncture sheath was exchanged for a 5 french sheath.  An omniflush catheter was advanced over the wire to the level of L-1.   Next, using the omniflush catheter and a benson wire, the aortic bifurcation was crossed and the catheter was placed into theright external iliac artery.  At that point in time we used a Rosen wire and exchanged for a long 6 [place] Ansell sheath in the left groin.  The patient was given 8000 units of IV heparin at that time and ACTs were checked throughout the case to maintain them greater than 250.  Ultimately our Ansell sheath was advanced to the right common femoral artery and we took a Rosen wire and quick cross catheter down to the below-knee popliteal artery. We then directed attention to the right  peroneal artery and the right foot had previously been prepped and draped in standard sterile fashion.  We anesthetized this area of the foot just above the ankle using 1% lidocaine.  Then using micro access needle and fluoroscopic guidance while injecting contrast through our quick cross catheter antegrade, we were able to cannulate the peroneal artery  in retrograde fashion and then placing a micro wire into the artery placed a 4 French micro sheath in retrograde fashion.  We then used a V 18 wire and CXI catheter to cross the peroneal occlusion in retrograde fashion back into the native tibioperoneal trunk.  We did note via antegrade injection from the sheath above that we did have a focal dissection in the proximal peroneal and tibioperoneal trunk where we then reentered the true lumen and then it appeared there was a separate dissection in the below-knee popliteal artery.  Ultimately our V 18 wire was snared using long snare from the left groin sheath and then pulled the wire out of the sheath in the left groin we had a flossed wire for antegrade access.  We then used a 3.0 mm x 220 mm Sterling balloon and angioplastied the peroneal, tibioperoneal trunk, below-knee popliteal artery.  Injection from the sheath above showed flow down the peroneal artery after angioplasty but there was a focal dissection that was previously described.  We exchanged with a long 150 cm CXI catheter that was placed over the aortic bifurcation down the peroneal and out our retrograde sheath for a 0.014 wire and then placed two 3.0 mm x 38 mm balloon expandable Synergy coronary stents in the proximal peroneal artery.  Additional hand-injection showed some residual dissections we placed a third 3.5 mm x 38 mm Synergy balloon expandable stent into the tibioperoneal trunk.  At that point in time our retrograde sheath was then removed and pressure was held at the ankle and we pulled our wire back and placed it down the peroneal artery past our retrograde access.  A final hand injected arteriogram showed brisk flow down the peroneal artery with a collateral filling the posterior tibial at the ankle and filling of the plantar arch.  There was a focal dissection in the below-knee popliteal artery but we felt this was not flow-limiting and we had reentered the true lumen below this dissection  and did not want a stent into the popliteal was necessary.  At that point in time we exchanged for a short 6 French sheath in the left groin and a mynx closure device was deployed using a Bentson wire for exchange.  Patient taken to PACU in stable condition.  Left PT signal and right peroneal signal brisk at completion of the case.  Additional pressure was held in the left groin.  Contrast: 80 mL  Condition: Stable  [name], MD [facility] Office: [phone] Pager: [place]

## 2018-07-21 NOTE — Progress Notes (Signed)
Patient returned from cath lab w left groin and R ankle dressings clean, dry, and intact. He denies pain. WOCN at bedside applying VAC and daughter present as well. He is requesting to eat and has stable vitals and reports feeling well.

## 2018-07-21 NOTE — Progress Notes (Signed)
PROGRESS NOTE  SHAWAN CORELLA WVP:[id number] DOB: 09/09/1945 DOA: 07/13/2018 PCP: [name], MD  HPI/Recap of past 24 hours: Lazlo L Gilmoreis a 72 y.o.malewith medical history significant ofDM; diabetic foot ulcer/PAD; HTN; HLD; ESRD on MWF HD; and CHF with AICD presenting with foot pain.He started with an ulcer that opened on Saturday, although he had a sore there for a few weeks. He was admitted for possible osteomyelitis of the right foot.  POD#0 on 07/21/18 post fluoroscopic cannulation of the right peroneal artery at the ankle in retrograde fashion by vascular surgery.  07/21/2018: Patient seen and examined with his daughter at his bedside.  Denies any pain at the wound VAC site.  Assessment/Plan: Principal Problem:   Diabetic foot infection ([place]) Active Problems:   Essential hypertension   Automatic implantable cardioverter-defibrillator in situ   ESRD (end stage renal disease) on dialysis [facility])   Diabetic infection of right foot ([place])   Cardiomyopathy, dilated, nonischemic (HCC)   Peripheral arterial disease (HCC)   Osteomyelitis of right foot ([place])   Vascular calcification   Cellulitis and abscess of foot, except toes   Acute osteomyelitis of metatarsal bone of right foot (HCC)   Diabetic ulcer of midfoot associated with diabetes mellitus due to underlying condition, with necrosis of bone ([place])   Encounter for planned post-operative wound closure  Right foot osteomyelitis and surrounding cellulitis of right foot status post fifth metatarsal amputation and debridement 12/7 Admitted for IV antibiotics and Dr March Rummage with Podiatry performed incision bone cortex, bone biopsy of the distal and proximal right fifth metatarsal, metatarsectomy, and wound VAC was placed.  He Underwent wound debridement again on 12/7. Wound vac was removed on 12/7 , it will placed back on 12/11.  Wound culture growing Enterobacter cloacae and Klebsiella oxytoca Continue IV Zosyn  End-stage  renal disease on hemodialysis Underwent TDC exchange by interventional radiology on 5462  Chronic systolic CHF Last 2D echo done on August 2014 revealed LVEF 20 to 25% Continue cardiac medications Strict I's and O's and daily weight  Anemia of chronic disease secondary to end-stage renal disease Continue supplement per nephro  Hypotension Blood pressure stable On Midodrine  Coronary artery disease/peripheral vascular disease Continue aspirin and Plavix   DVT prophylaxis: heparin.  Subcu Code Status: full code.  Family Communication: none at bedside.  Disposition Plan: Pending further work up.    Consultants:   Podiatry. Dr March Rummage.   Nephrology  Vascular surgery Dr Trula Slade     Procedures: Metatarsectomy, bone biopsy of the fifth metatarsal wound VAC placement on 07/15/2018 by Dr. Marvis Moeller and fistulogram on 12/10   Antimicrobials: vancomycin , and zosyn from 12/10.   Objective: Vitals:   07/21/18 1237 07/21/18 1242 07/21/18 1247 07/21/18 1252  BP: 120/74 104/66 112/73   Pulse: 88 92 87 (!) 0  Resp: 15 (!) 21 16 (!) 4  Temp:      TempSrc:      SpO2: 100% 100% 99% (!) 0%  Weight:      Height:        Intake/Output Summary (Last 24 hours) at 07/21/2018 1441 Last data filed at 07/21/2018 0943 Gross per 24 hour  Intake 315.5 ml  Output 1700 ml  Net -1384.5 ml   Filed Weights   07/21/18 0420 07/21/18 0725 07/21/18 0943  Weight: 79.9 kg 81 kg 79.3 kg    Exam:  . General: 72 y.o. year-old male well developed well nourished in no acute distress.  Alert and oriented  x3. . Cardiovascular: Regular rate and rhythm with no rubs or gallops.  No thyromegaly or JVD noted.   Marland Kitchen Respiratory: Clear to auscultation with no wheezes or rales. Good inspiratory effort. . Abdomen: Soft nontender nondistended with normal bowel sounds x4 quadrants. . Musculoskeletal: Trace lower extremity edema. 2/4 pulses in all 4 extremities. . Skin: Right foot with wound  VAC. Marland Kitchen Psychiatry: Mood is appropriate for condition and setting   Data Reviewed: CBC: Recent Labs  Lab 07/15/18 1726 07/16/18 1911 07/19/18 0745 07/21/18 0755  WBC  --  9.8 9.5 10.0  HGB 15.3 11.8* 11.2* 10.6*  HCT 45.0 38.8* 36.0* 35.5*  MCV  --  106.0* 106.8* 106.6*  PLT  --  224 209 564   Basic Metabolic Panel: Recent Labs  Lab 07/15/18 1726 07/16/18 1911 07/19/18 0745 07/21/18 0755  NA 137 136 137 134*  K 5.2* 4.7 4.3 4.0  CL  --  97* 98 95*  CO2  --  21* 22 23  GLUCOSE 84 144* 114* 78  BUN  --  60* 48* 37*  CREATININE  --  11.74* 11.25* 9.50*  CALCIUM  --  8.3* 8.7* 8.4*  PHOS  --  7.2* 6.2* 4.8*   GFR: Estimated Creatinine Clearance: 6.8 mL/min (A) (by C-G formula based on SCr of 9.5 mg/dL (H)). Liver Function Tests: Recent Labs  Lab 07/16/18 1911 07/19/18 0745 07/21/18 0755  ALBUMIN 2.6* 2.9* 2.7*   No results for input(s): LIPASE, AMYLASE in the last 168 hours. No results for input(s): AMMONIA in the last 168 hours. Coagulation Profile: No results for input(s): INR, PROTIME in the last 168 hours. Cardiac Enzymes: No results for input(s): CKTOTAL, CKMB, CKMBINDEX, TROPONINI in the last 168 hours. BNP (last 3 results) No results for input(s): PROBNP in the last 8760 hours. HbA1C: No results for input(s): HGBA1C in the last 72 hours. CBG: Recent Labs  Lab 07/21/18 0417 07/21/18 0445 07/21/18 0815 07/21/18 1245 07/21/18 1427  GLUCAP 64* 127* 72 58* 101*   Lipid Profile: No results for input(s): CHOL, HDL, LDLCALC, TRIG, CHOLHDL, LDLDIRECT in the last 72 hours. Thyroid Function Tests: No results for input(s): TSH, T4TOTAL, FREET4, T3FREE, THYROIDAB in the last 72 hours. Anemia Panel: No results for input(s): VITAMINB12, FOLATE, FERRITIN, TIBC, IRON, RETICCTPCT in the last 72 hours. Urine analysis:    Component Value Date/Time   COLORURINE YELLOW 03/11/2013 1548   APPEARANCEUR CLOUDY (A) 03/11/2013 1548   LABSPEC 1.017 03/11/2013 1548    PHURINE 5.0 03/11/2013 1548   GLUCOSEU NEGATIVE 03/11/2013 1548   HGBUR NEGATIVE 03/11/2013 1548   [place] 03/11/2013 1548   KETONESUR NEGATIVE 03/11/2013 1548   PROTEINUR >300 (A) 03/11/2013 1548   UROBILINOGEN 1.0 03/11/2013 1548   NITRITE NEGATIVE 03/11/2013 1548   LEUKOCYTESUR SMALL (A) 03/11/2013 1548   Sepsis Labs: @LABRCNTIP (procalcitonin:4,lacticidven:4)  ) Recent Results (from the past 240 hour(s))  Blood culture (routine x 2)     Status: None   Collection Time: 07/13/18 12:52 PM  Result Value Ref Range Status   Specimen Description BLOOD RIGHT HAND  Final   Special Requests   Final    BOTTLES DRAWN AEROBIC ONLY Blood Culture results may not be optimal due to an inadequate volume of blood received in culture bottles   Culture   Final    NO GROWTH 5 DAYS Performed at [facility], [place] [address], [place], [place] [address]    Report Status 07/18/2018 FINAL  Final  Blood culture (routine  x 2)     Status: None   Collection Time: 07/13/18 12:57 PM  Result Value Ref Range Status   Specimen Description BLOOD BLOOD RIGHT FOREARM  Final   Special Requests   Final    BOTTLES DRAWN AEROBIC AND ANAEROBIC Blood Culture results may not be optimal due to an inadequate volume of blood received in culture bottles   Culture   Final    NO GROWTH 5 DAYS Performed at [facility], [place] [address] [place], [place] [address]    Report Status 07/18/2018 FINAL  Final  WOUND CULTURE     Status: Abnormal   Collection Time: 07/13/18  1:24 PM  Result Value Ref Range Status   MICRO NUMBER: [id number]  Final   SPECIMEN QUALITY: Adequate  Final   SOURCE: NOT GIVEN  Final   STATUS: FINAL  Final   GRAM STAIN:   Final    No white blood cells seen No epithelial cells seen Many Gram positive cocci in pairs Many Gram negative bacilli   ISOLATE 1: Enterobacter cloacae complex (A)  Final    Comment: Heavy growth of Enterobacter cloacae complex   ISOLATE 2: Klebsiella oxytoca  (A)  Final    Comment: Heavy growth of Klebsiella oxytoca      Susceptibility   Klebsiella oxytoca - AEROBIC CULT, GRAM STAIN NEGATIVE 2    AMOX/CLAVULANIC <=2 Sensitive     AMPICILLIN  Resistant     AMPICILLIN/SULBACTAM <=2 Sensitive     CEFAZOLIN <=4 Not Reportable     CEFEPIME <=1 Sensitive     CEFTRIAXONE <=1 Sensitive     CIPROFLOXACIN <=0.25 Sensitive     LEVOFLOXACIN <=0.12 Sensitive     ERTAPENEM <=0.5 Sensitive     GENTAMICIN <=1 Sensitive     IMIPENEM <=0.25 Sensitive     PIP/TAZO <=4 Sensitive     TOBRAMYCIN <=1 Sensitive     TRIMETH/SULFA* <=20 Sensitive      * For infections other than uncomplicated UTIcaused by E. coli, K. pneumoniae or P. mirabilis:Cefazolin is resistant if MIC > or = 8 mcg/mL.(Distinguishing susceptible versus intermediatefor isolates with MIC < or = 4 mcg/mL requiresadditional testing.)Legend:S = Susceptible  I = IntermediateR = Resistant  NS = Not susceptible* = Not tested  NR = Not reported**NN = See antimicrobic comments   Enterobacter cloacae complex - AEROBIC CULT, GRAM STAIN NEGATIVE 1    AMOX/CLAVULANIC >=32 Resistant     CEFAZOLIN* >=64 Resistant      * For infections other than uncomplicated UTIcaused by E. coli, K. pneumoniae or P. mirabilis:Cefazolin is resistant if MIC > or = 8 mcg/mL.(Distinguishing susceptible versus intermediatefor isolates with MIC < or = 4 mcg/mL requiresadditional testing.)    CEFEPIME <=1 Sensitive     CEFTRIAXONE <=1 Sensitive     CIPROFLOXACIN <=0.25 Sensitive     LEVOFLOXACIN <=0.12 Sensitive     ERTAPENEM <=0.5 Sensitive     GENTAMICIN <=1 Sensitive     IMIPENEM <=0.25 Sensitive     PIP/TAZO <=4 Sensitive     TOBRAMYCIN <=1 Sensitive     TRIMETH/SULFA <=20 Sensitive   Surgical pcr screen     Status: None   Collection Time: 07/13/18  9:11 PM  Result Value Ref Range Status   MRSA, PCR NEGATIVE NEGATIVE Final   Staphylococcus aureus NEGATIVE NEGATIVE Final    Comment: (NOTE) The Xpert SA Assay (FDA approved  for NASAL specimens in patients 15 years of age and older), is one component  of a comprehensive surveillance program. It is not intended to diagnose infection nor to guide or monitor treatment. Performed at [facility], [place] [address], [place], [place] [address]   Aerobic/Anaerobic Culture (surgical/deep wound)     Status: None   Collection Time: 07/15/18  6:47 PM  Result Value Ref Range Status   Specimen Description BONE TISSUE  Final   Special Requests METATARSAL BONE FROM RIGHT FOOT  Final   Gram Stain   Final    MODERATE WBC PRESENT,BOTH PMN AND MONONUCLEAR NO ORGANISMS SEEN    Culture   Final    RARE ENTEROBACTER CLOACAE RARE ENTEROCOCCUS FAECALIS NO ANAEROBES ISOLATED Performed at [facility], [place] [address], [place], [place] [address]    Report Status 07/20/2018 FINAL  Final   Organism ID, Bacteria ENTEROBACTER CLOACAE  Final   Organism ID, Bacteria ENTEROCOCCUS FAECALIS  Final      Susceptibility   Enterobacter cloacae - MIC*    CEFAZOLIN >=64 RESISTANT Resistant     CEFEPIME <=1 SENSITIVE Sensitive     CEFTAZIDIME <=1 SENSITIVE Sensitive     CEFTRIAXONE <=1 SENSITIVE Sensitive     CIPROFLOXACIN <=0.25 SENSITIVE Sensitive     GENTAMICIN <=1 SENSITIVE Sensitive     IMIPENEM 0.5 SENSITIVE Sensitive     TRIMETH/SULFA <=20 SENSITIVE Sensitive     PIP/TAZO <=4 SENSITIVE Sensitive     * RARE ENTEROBACTER CLOACAE   Enterococcus faecalis - MIC*    AMPICILLIN <=2 SENSITIVE Sensitive     VANCOMYCIN 1 SENSITIVE Sensitive     GENTAMICIN SYNERGY SENSITIVE Sensitive     * RARE ENTEROCOCCUS FAECALIS  Aerobic/Anaerobic Culture (surgical/deep wound)     Status: None   Collection Time: 07/15/18  6:50 PM  Result Value Ref Range Status   Specimen Description TISSUE  Final   Special Requests SOFT TISSUE RIGHT FOOT  Final   Gram Stain   Final    FEW WBC PRESENT,BOTH PMN AND MONONUCLEAR RARE GRAM NEGATIVE RODS    Culture   Final    FEW ENTEROBACTER CLOACAE FEW  ENTEROCOCCUS FAECALIS FEW KLEBSIELLA OXYTOCA NO ANAEROBES ISOLATED Performed at [facility], [address], [place], [place] [address]    Report Status 07/20/2018 FINAL  Final   Organism ID, Bacteria ENTEROBACTER CLOACAE  Final   Organism ID, Bacteria ENTEROCOCCUS FAECALIS  Final   Organism ID, Bacteria KLEBSIELLA OXYTOCA  Final      Susceptibility   Enterobacter cloacae - MIC*    CEFAZOLIN >=64 RESISTANT Resistant     CEFEPIME <=1 SENSITIVE Sensitive     CEFTAZIDIME <=1 SENSITIVE Sensitive     CEFTRIAXONE <=1 SENSITIVE Sensitive     CIPROFLOXACIN <=0.25 SENSITIVE Sensitive     GENTAMICIN <=1 SENSITIVE Sensitive     IMIPENEM <=0.25 SENSITIVE Sensitive     TRIMETH/SULFA <=20 SENSITIVE Sensitive     PIP/TAZO <=4 SENSITIVE Sensitive     * FEW ENTEROBACTER CLOACAE   Enterococcus faecalis - MIC*    AMPICILLIN <=2 SENSITIVE Sensitive     VANCOMYCIN 1 SENSITIVE Sensitive     GENTAMICIN SYNERGY SENSITIVE Sensitive     * FEW ENTEROCOCCUS FAECALIS   Klebsiella oxytoca - MIC*    AMPICILLIN >=32 RESISTANT Resistant     CEFAZOLIN 16 SENSITIVE Sensitive     CEFEPIME <=1 SENSITIVE Sensitive     CEFTAZIDIME <=1 SENSITIVE Sensitive     CEFTRIAXONE <=1 SENSITIVE Sensitive     CIPROFLOXACIN <=0.25 SENSITIVE Sensitive  GENTAMICIN <=1 SENSITIVE Sensitive     IMIPENEM <=0.25 SENSITIVE Sensitive     TRIMETH/SULFA <=20 SENSITIVE Sensitive     AMPICILLIN/SULBACTAM 4 SENSITIVE Sensitive     PIP/TAZO <=4 SENSITIVE Sensitive     Extended ESBL NEGATIVE Sensitive     * FEW KLEBSIELLA OXYTOCA      Studies: No results found.  Scheduled Meds: . acetaminophen      . aspirin EC  81 mg Oral Daily  . calcitRIOL  1 mcg Oral Once per day on Mon Wed Fri  . Chlorhexidine Gluconate Cloth  6 each Topical Q0600  . cinacalcet  30 mg Oral Q breakfast  . clopidogrel  300 mg Oral Once   Followed by  . [START ON 07/22/2018] clopidogrel  75 mg Oral Q breakfast  . feeding supplement (NEPRO CARB  STEADY)  237 mL Oral Q24H  . feeding supplement (PRO-STAT SUGAR FREE 64)  30 mL Oral BID WC  . ferric citrate  630 mg Oral TID WC  . gabapentin  100 mg Oral QHS  . heparin  1,000 Units Intravenous Q M,W,F-HD  . insulin aspart  0-15 Units Subcutaneous TID WC  . insulin aspart  0-5 Units Subcutaneous QHS  . lidocaine  20 mL Infiltration Once  . midodrine  10 mg Oral Once per day on Mon Wed Fri  . multivitamin  1 tablet Oral QHS  . pantoprazole  40 mg Oral Q0600  . sodium chloride flush  3 mL Intravenous Q12H  . sodium chloride flush  3 mL Intravenous Q12H    Continuous Infusions: . sodium chloride 10 mL/hr at 07/15/18 1721  . sodium chloride    . sodium chloride    . piperacillin-tazobactam (ZOSYN)  IV 3.375 g (07/20/18 2209)  . vancomycin       LOS: 8 days     Kayleen Memos, MD [facility] Pager [phone]  If 7PM-7AM, please contact night-coverage [email or web address] Password [email or web address] 07/21/2018, 2:41 PM

## 2018-07-21 NOTE — Consult Note (Signed)
[place] Nurse wound consult note Reason for Consult: Consult requested for WOC to apply Vac dressing to right foot wound Wound type: Full thickness post-op wound Measurement: 1X1X.3cm Wound bed: dark red and moist Drainage (amount, consistency, odor) small amt tan drainage, no odor Periwound: intact skin surrounding, staples well approximated near outer wound edges Dressing procedure/placement/frequency: Applied one piece black foam with a barrier ring surrounding to attempt to maintain a seal near toes.  Cont suction on at 142mm.  Pt denied need for pain meds and tolerated without c/o pain.  Applied ace wrap over the location and propped on pillow to reduce pressure to heel. [place] team will plan to change Q M/W/F while in the hospital. Julien Girt MSN, RN, [place], [place], [place]

## 2018-07-22 ENCOUNTER — Telehealth: Payer: Self-pay | Admitting: Cardiology

## 2018-07-22 ENCOUNTER — Inpatient Hospital Stay (HOSPITAL_COMMUNITY): Payer: Medicare Other

## 2018-07-22 ENCOUNTER — Ambulatory Visit: Payer: Medicare Other

## 2018-07-22 ENCOUNTER — Encounter: Payer: Self-pay | Admitting: Podiatry

## 2018-07-22 DIAGNOSIS — B952 Enterococcus as the cause of diseases classified elsewhere: Secondary | ICD-10-CM

## 2018-07-22 DIAGNOSIS — Z87891 Personal history of nicotine dependence: Secondary | ICD-10-CM

## 2018-07-22 DIAGNOSIS — E1169 Type 2 diabetes mellitus with other specified complication: Principal | ICD-10-CM

## 2018-07-22 DIAGNOSIS — E1122 Type 2 diabetes mellitus with diabetic chronic kidney disease: Secondary | ICD-10-CM

## 2018-07-22 DIAGNOSIS — Z978 Presence of other specified devices: Secondary | ICD-10-CM

## 2018-07-22 DIAGNOSIS — Z95810 Presence of automatic (implantable) cardiac defibrillator: Secondary | ICD-10-CM

## 2018-07-22 DIAGNOSIS — L97519 Non-pressure chronic ulcer of other part of right foot with unspecified severity: Secondary | ICD-10-CM

## 2018-07-22 DIAGNOSIS — E1151 Type 2 diabetes mellitus with diabetic peripheral angiopathy without gangrene: Secondary | ICD-10-CM

## 2018-07-22 DIAGNOSIS — E11621 Type 2 diabetes mellitus with foot ulcer: Secondary | ICD-10-CM

## 2018-07-22 DIAGNOSIS — K0889 Other specified disorders of teeth and supporting structures: Secondary | ICD-10-CM

## 2018-07-22 DIAGNOSIS — B9689 Other specified bacterial agents as the cause of diseases classified elsewhere: Secondary | ICD-10-CM

## 2018-07-22 DIAGNOSIS — Z01810 Encounter for preprocedural cardiovascular examination: Secondary | ICD-10-CM

## 2018-07-22 DIAGNOSIS — Z89421 Acquired absence of other right toe(s): Secondary | ICD-10-CM

## 2018-07-22 LAB — GLUCOSE, CAPILLARY
GLUCOSE-CAPILLARY: 84 mg/dL (ref 70–99)
Glucose-Capillary: 65 mg/dL — ABNORMAL LOW (ref 70–99)
Glucose-Capillary: 122 mg/dL — ABNORMAL HIGH (ref 70–99)
Glucose-Capillary: 114 mg/dL — ABNORMAL HIGH (ref 70–99)
Glucose-Capillary: 129 mg/dL — ABNORMAL HIGH (ref 70–99)

## 2018-07-22 MED ORDER — CHLORHEXIDINE GLUCONATE CLOTH 2 % EX PADS: 6.0000 | MEDICATED_PAD | Freq: Every day | CUTANEOUS | Status: DC

## 2018-07-22 MED ORDER — VANCOMYCIN HCL IN DEXTROSE 750-5 MG/150ML-% IV SOLN
750.0000 mg | Freq: Once | INTRAVENOUS | Status: AC
  Administered 2018-07-22: 750 mg via INTRAVENOUS
  Filled 2018-07-22: qty 150

## 2018-07-22 MED ORDER — SODIUM CHLORIDE 0.9 % IV SOLN
2.0000 g | INTRAVENOUS | Status: DC
  Administered 2018-07-23: 2 g via INTRAVENOUS
  Filled 2018-07-22 (×2): qty 2

## 2018-07-22 MED ORDER — VANCOMYCIN HCL IN DEXTROSE 750-5 MG/150ML-% IV SOLN
750.0000 mg | INTRAVENOUS | Status: DC
  Administered 2018-07-23: 750 mg via INTRAVENOUS
  Filled 2018-07-22: qty 150

## 2018-07-22 MED ORDER — BOOST / RESOURCE BREEZE PO LIQD CUSTOM
1.0000 | Freq: Three times a day (TID) | ORAL | Status: DC
  Administered 2018-07-22: 1 via ORAL

## 2018-07-22 MED ORDER — COLLAGENASE 250 UNIT/GM EX OINT
TOPICAL_OINTMENT | Freq: Every day | CUTANEOUS | Status: DC
  Administered 2018-07-23: 14:00:00 via TOPICAL
  Filled 2018-07-22: qty 30

## 2018-07-22 MED ORDER — BOOST / RESOURCE BREEZE PO LIQD CUSTOM: 1.0000 | Freq: Two times a day (BID) | ORAL | Status: DC

## 2018-07-22 NOTE — Care Management Note (Signed)
Case Management Note Marvetta Gibbons RN, BSN Transitions of Care Unit 4E- RN Case Manager [phone]  Patient Details  Name: Timothy Faulkner MRN: [id number] Date of Birth: 18-Jan-1946  Subjective/Objective:  Pt admitted with DM infected foot, osteo, s/p great toe amputation with wound VAC placement               Action/Plan: PTA pt lived at home, hx ESRD on HD mwf- noted in chart plan for pt to have home wound VAC, and iv abx with HD through Jan. 20- orders placed for HHRN/PT- spoke with pt at bedside for transition of care needs- pt provided list per CMS for [facility] agency choice- per pt he would like to use [facility] for [facility] needs- home wound VAC form for [facility] wound VAC on shadow chart for signature- MD to sign today. Spoke with Butch Penny at [facility] for [facility] and DME referral- home wound VAC once approved will be delivered to room and placed on pt per [facility] RN. - DME order also placed for RW- per pt he thinks he has one- will have daughter check tonight and let staff know if he needs RW.    Expected Discharge Date:                 Expected Discharge Plan:  [place]  In-House Referral:     Discharge planning Services  CM Consult  Post Acute Care Choice:  Durable Medical Equipment, Home Health Choice offered to:  Patient  DME Arranged:  Vac DME Agency:  [place] Arranged:  RN, PT [facility] Agency:  [place]  Status of Service:  In process, will continue to follow  If discussed at Long Length of Stay Meetings, dates discussed:    Discharge Disposition: home/home health   Additional Comments:  Dawayne Patricia, RN 07/22/2018, 4:03 PM

## 2018-07-22 NOTE — Progress Notes (Signed)
PROGRESS NOTE  JERRETT BALDINGER LYY:[id number] DOB: 08/15/1945 DOA: 07/13/2018 PCP: [name], MD  HPI/Recap of past 24 hours: Romyn L Gilmoreis a 72 y.o.malewith medical history significant ofDM; diabetic foot ulcer/PAD; HTN; HLD; ESRD on MWF HD; and CHF with AICD presenting with foot pain.He started with an ulcer that opened on Saturday, although he had a sore there for a few weeks. He was admitted for possible osteomyelitis of the right foot.  POD#0 on 07/21/18 post fluoroscopic cannulation of the right peroneal artery at the ankle in retrograde fashion by vascular surgery.  07/21/2018: Patient seen and examined with his daughter at his bedside.  Denies any pain at the wound VAC site.  07/22/2018: Patient seen and examined at bedside.  No acute events overnight.  Pain is currently well controlled on current pain management.  Infectious disease consulted and following for right foot osteomyelitis.  Assessment/Plan: Principal Problem:   Diabetic foot infection ([place]) Active Problems:   Essential hypertension   Automatic implantable cardioverter-defibrillator in situ   ESRD (end stage renal disease) on dialysis [facility])   Diabetic infection of right foot ([place])   Cardiomyopathy, dilated, nonischemic (HCC)   Peripheral arterial disease (HCC)   Osteomyelitis of right foot ([place])   Vascular calcification   Cellulitis and abscess of foot, except toes   Acute osteomyelitis of metatarsal bone of right foot (HCC)   Diabetic ulcer of midfoot associated with diabetes mellitus due to underlying condition, with necrosis of bone ([place])   Encounter for planned post-operative wound closure  Right foot osteomyelitis and surrounding cellulitis of right foot status post fifth metatarsal amputation and debridement 12/7 Admitted for IV antibiotics and Dr March Rummage with Podiatry performed incision bone cortex, bone biopsy of the distal and proximal right fifth metatarsal, metatarsectomy, and wound VAC was  placed.  He Underwent wound debridement again on 12/7. Wound vac was removed on 12/7 , it will placed back on 12/11.  Wound culture growing Enterobacter cloacae and Klebsiella oxytoca Infectious disease consulted and following DC Zosyn start ceftazidime and IV vancomycin with dialysis with end date 08/30/2018 as recommended by infectious disease  Peripheral artery disease status post right lower extremity angioplasty below the knee vessels with stent placement Vascular surgery following  End-stage renal disease on hemodialysis Monday Wednesday Friday Underwent [facility] exchange by interventional radiology on 2519 HD planned tomorrow 12/75/1700  Chronic systolic CHF Last 2D echo done on August 2014 revealed LVEF 20 to 25% Continue cardiac medications Continue strict I's and O's and daily weight  Anemia of chronic disease secondary to end-stage renal disease Continue supplement per nephro  Hypotension Blood pressure stable On Midodrine  Coronary artery disease/peripheral vascular disease Continue aspirin and Plavix  Ambulatory dysfunction/physical debility PT recommended home health PT Fall precautions    DVT prophylaxis: heparin.  Subcu daily Code Status: full code.  Family Communication:  Updated daughter at bedside on 07/21/2018. Disposition Plan:  Home with home health PT and RN once infectious disease and vascular surgery signed off.   Consultants:   Podiatry. Dr March Rummage.   Nephrology  Vascular surgery Dr Trula Slade     Procedures: Metatarsectomy, bone biopsy of the fifth metatarsal wound VAC placement on 07/15/2018 by Dr. Marvis Moeller and fistulogram on 12/10   Antimicrobials: vancomycin , and zosyn from 12/10.   Objective: Vitals:   07/21/18 1430 07/21/18 2039 07/22/18 0341 07/22/18 1512  BP: 102/71 (!) 87/59 101/63 96/63  Pulse:  93  91  Resp: 16 (!) 21 16 18  Temp:  98.9 F (37.2 C) 97.7 F (36.5 C) (!) 97.4 F (36.3 C)  TempSrc:  Oral Oral Oral    SpO2:   100% 96%  Weight:      Height:        Intake/Output Summary (Last 24 hours) at 07/22/2018 1559 Last data filed at 07/22/2018 1300 Gross per 24 hour  Intake 880.04 ml  Output 0 ml  Net 880.04 ml   Filed Weights   07/21/18 0420 07/21/18 0725 07/21/18 0943  Weight: 79.9 kg 81 kg 79.3 kg    Exam:  . General: 72 y.o. year-old male well developed well-nourished in no acute distress.  Alert oriented x3. . Cardiovascular: Regular rate and rhythm with no rubs or gallops.  No JVD or thyromegaly noted..   . Respiratory: Clear to auscultation with no wheezes or rales.  Good respiratory effort.  . Abdomen: Soft nontender nondistended with normal bowel sounds x4 quadrants. . Musculoskeletal: Trace lower extremity edema. 2/4 pulses in all 4 extremities. . Skin: Right foot with wound VAC. Marland Kitchen Psychiatry: Mood is appropriate for condition and setting   Data Reviewed: CBC: Recent Labs  Lab 07/15/18 1726 07/16/18 1911 07/19/18 0745 07/21/18 0755  WBC  --  9.8 9.5 10.0  HGB 15.3 11.8* 11.2* 10.6*  HCT 45.0 38.8* 36.0* 35.5*  MCV  --  106.0* 106.8* 106.6*  PLT  --  224 209 970   Basic Metabolic Panel: Recent Labs  Lab 07/15/18 1726 07/16/18 1911 07/19/18 0745 07/21/18 0755  NA 137 136 137 134*  K 5.2* 4.7 4.3 4.0  CL  --  97* 98 95*  CO2  --  21* 22 23  GLUCOSE 84 144* 114* 78  BUN  --  60* 48* 37*  CREATININE  --  11.74* 11.25* 9.50*  CALCIUM  --  8.3* 8.7* 8.4*  PHOS  --  7.2* 6.2* 4.8*   GFR: Estimated Creatinine Clearance: 6.8 mL/min (A) (by C-G formula based on SCr of 9.5 mg/dL (H)). Liver Function Tests: Recent Labs  Lab 07/16/18 1911 07/19/18 0745 07/21/18 0755  ALBUMIN 2.6* 2.9* 2.7*   No results for input(s): LIPASE, AMYLASE in the last 168 hours. No results for input(s): AMMONIA in the last 168 hours. Coagulation Profile: No results for input(s): INR, PROTIME in the last 168 hours. Cardiac Enzymes: No results for input(s): CKTOTAL, CKMB,  CKMBINDEX, TROPONINI in the last 168 hours. BNP (last 3 results) No results for input(s): PROBNP in the last 8760 hours. HbA1C: No results for input(s): HGBA1C in the last 72 hours. CBG: Recent Labs  Lab 07/21/18 1427 07/21/18 2034 07/22/18 0632 07/22/18 0654 07/22/18 1108  GLUCAP 101* 115* 65* 84 122*   Lipid Profile: No results for input(s): CHOL, HDL, LDLCALC, TRIG, CHOLHDL, LDLDIRECT in the last 72 hours. Thyroid Function Tests: No results for input(s): TSH, T4TOTAL, FREET4, T3FREE, THYROIDAB in the last 72 hours. Anemia Panel: No results for input(s): VITAMINB12, FOLATE, FERRITIN, TIBC, IRON, RETICCTPCT in the last 72 hours. Urine analysis:    Component Value Date/Time   COLORURINE YELLOW 03/11/2013 1548   APPEARANCEUR CLOUDY (A) 03/11/2013 1548   LABSPEC 1.017 03/11/2013 1548   PHURINE 5.0 03/11/2013 1548   GLUCOSEU NEGATIVE 03/11/2013 1548   HGBUR NEGATIVE 03/11/2013 1548   BILIRUBINUR NEGATIVE 03/11/2013 1548   KETONESUR NEGATIVE 03/11/2013 1548   PROTEINUR >300 (A) 03/11/2013 1548   UROBILINOGEN 1.0 03/11/2013 1548   NITRITE NEGATIVE 03/11/2013 1548   LEUKOCYTESUR SMALL (A) 03/11/2013 1548   Sepsis  Labs: @LABRCNTIP (procalcitonin:4,lacticidven:4)  ) Recent Results (from the past 240 hour(s))  Blood culture (routine x 2)     Status: None   Collection Time: 07/13/18 12:52 PM  Result Value Ref Range Status   Specimen Description BLOOD RIGHT HAND  Final   Special Requests   Final    BOTTLES DRAWN AEROBIC ONLY Blood Culture results may not be optimal due to an inadequate volume of blood received in culture bottles   Culture   Final    NO GROWTH 5 DAYS Performed at [facility], [place] [address] [place], [place] [address]    Report Status 07/18/2018 FINAL  Final  Blood culture (routine x 2)     Status: None   Collection Time: 07/13/18 12:57 PM  Result Value Ref Range Status   Specimen Description BLOOD BLOOD RIGHT FOREARM  Final   Special Requests    Final    BOTTLES DRAWN AEROBIC AND ANAEROBIC Blood Culture results may not be optimal due to an inadequate volume of blood received in culture bottles   Culture   Final    NO GROWTH 5 DAYS Performed at [facility], [place] [address], [place], [place] [address]    Report Status 07/18/2018 FINAL  Final  WOUND CULTURE     Status: Abnormal   Collection Time: 07/13/18  1:24 PM  Result Value Ref Range Status   MICRO NUMBER: [id number]  Final   SPECIMEN QUALITY: Adequate  Final   SOURCE: NOT GIVEN  Final   STATUS: FINAL  Final   GRAM STAIN:   Final    No white blood cells seen No epithelial cells seen Many Gram positive cocci in pairs Many Gram negative bacilli   ISOLATE 1: Enterobacter cloacae complex (A)  Final    Comment: Heavy growth of Enterobacter cloacae complex   ISOLATE 2: Klebsiella oxytoca (A)  Final    Comment: Heavy growth of Klebsiella oxytoca      Susceptibility   Klebsiella oxytoca - AEROBIC CULT, GRAM STAIN NEGATIVE 2    AMOX/CLAVULANIC <=2 Sensitive     AMPICILLIN  Resistant     AMPICILLIN/SULBACTAM <=2 Sensitive     CEFAZOLIN <=4 Not Reportable     CEFEPIME <=1 Sensitive     CEFTRIAXONE <=1 Sensitive     CIPROFLOXACIN <=0.25 Sensitive     LEVOFLOXACIN <=0.12 Sensitive     ERTAPENEM <=0.5 Sensitive     GENTAMICIN <=1 Sensitive     IMIPENEM <=0.25 Sensitive     PIP/TAZO <=4 Sensitive     TOBRAMYCIN <=1 Sensitive     TRIMETH/SULFA* <=20 Sensitive      * For infections other than uncomplicated UTIcaused by E. coli, K. pneumoniae or P. mirabilis:Cefazolin is resistant if MIC > or = 8 mcg/mL.(Distinguishing susceptible versus intermediatefor isolates with MIC < or = 4 mcg/mL requiresadditional testing.)Legend:S = Susceptible  I = IntermediateR = Resistant  NS = Not susceptible* = Not tested  NR = Not reported**NN = See antimicrobic comments   Enterobacter cloacae complex - AEROBIC CULT, GRAM STAIN NEGATIVE 1    AMOX/CLAVULANIC >=32 Resistant     CEFAZOLIN* >=64  Resistant      * For infections other than uncomplicated UTIcaused by E. coli, K. pneumoniae or P. mirabilis:Cefazolin is resistant if MIC > or = 8 mcg/mL.(Distinguishing susceptible versus intermediatefor isolates with MIC < or = 4 mcg/mL requiresadditional testing.)    CEFEPIME <=1 Sensitive     CEFTRIAXONE <=1 Sensitive  CIPROFLOXACIN <=0.25 Sensitive     LEVOFLOXACIN <=0.12 Sensitive     ERTAPENEM <=0.5 Sensitive     GENTAMICIN <=1 Sensitive     IMIPENEM <=0.25 Sensitive     PIP/TAZO <=4 Sensitive     TOBRAMYCIN <=1 Sensitive     TRIMETH/SULFA <=20 Sensitive   Surgical pcr screen     Status: None   Collection Time: 07/13/18  9:11 PM  Result Value Ref Range Status   MRSA, PCR NEGATIVE NEGATIVE Final   Staphylococcus aureus NEGATIVE NEGATIVE Final    Comment: (NOTE) The Xpert SA Assay (FDA approved for NASAL specimens in patients 25 years of age and older), is one component of a comprehensive surveillance program. It is not intended to diagnose infection nor to guide or monitor treatment. Performed at [facility], [place] [address] [place], [place] [address]   Aerobic/Anaerobic Culture (surgical/deep wound)     Status: None   Collection Time: 07/15/18  6:47 PM  Result Value Ref Range Status   Specimen Description BONE TISSUE  Final   Special Requests METATARSAL BONE FROM RIGHT FOOT  Final   Gram Stain   Final    MODERATE WBC PRESENT,BOTH PMN AND MONONUCLEAR NO ORGANISMS SEEN    Culture   Final    RARE ENTEROBACTER CLOACAE RARE ENTEROCOCCUS FAECALIS NO ANAEROBES ISOLATED Performed at [facility], [place] [address], [place], [place] [address]    Report Status 07/20/2018 FINAL  Final   Organism ID, Bacteria ENTEROBACTER CLOACAE  Final   Organism ID, Bacteria ENTEROCOCCUS FAECALIS  Final      Susceptibility   Enterobacter cloacae - MIC*    CEFAZOLIN >=64 RESISTANT Resistant     CEFEPIME <=1 SENSITIVE Sensitive     CEFTAZIDIME <=1 SENSITIVE Sensitive      CEFTRIAXONE <=1 SENSITIVE Sensitive     CIPROFLOXACIN <=0.25 SENSITIVE Sensitive     GENTAMICIN <=1 SENSITIVE Sensitive     IMIPENEM 0.5 SENSITIVE Sensitive     TRIMETH/SULFA <=20 SENSITIVE Sensitive     PIP/TAZO <=4 SENSITIVE Sensitive     * RARE ENTEROBACTER CLOACAE   Enterococcus faecalis - MIC*    AMPICILLIN <=2 SENSITIVE Sensitive     VANCOMYCIN 1 SENSITIVE Sensitive     GENTAMICIN SYNERGY SENSITIVE Sensitive     * RARE ENTEROCOCCUS FAECALIS  Aerobic/Anaerobic Culture (surgical/deep wound)     Status: None   Collection Time: 07/15/18  6:50 PM  Result Value Ref Range Status   Specimen Description TISSUE  Final   Special Requests SOFT TISSUE RIGHT FOOT  Final   Gram Stain   Final    FEW WBC PRESENT,BOTH PMN AND MONONUCLEAR RARE GRAM NEGATIVE RODS    Culture   Final    FEW ENTEROBACTER CLOACAE FEW ENTEROCOCCUS FAECALIS FEW KLEBSIELLA OXYTOCA NO ANAEROBES ISOLATED Performed at [facility], [address], [place], [place] [address]    Report Status 07/20/2018 FINAL  Final   Organism ID, Bacteria ENTEROBACTER CLOACAE  Final   Organism ID, Bacteria ENTEROCOCCUS FAECALIS  Final   Organism ID, Bacteria KLEBSIELLA OXYTOCA  Final      Susceptibility   Enterobacter cloacae - MIC*    CEFAZOLIN >=64 RESISTANT Resistant     CEFEPIME <=1 SENSITIVE Sensitive     CEFTAZIDIME <=1 SENSITIVE Sensitive     CEFTRIAXONE <=1 SENSITIVE Sensitive     CIPROFLOXACIN <=0.25 SENSITIVE Sensitive     GENTAMICIN <=1 SENSITIVE Sensitive     IMIPENEM <=0.25 SENSITIVE Sensitive  TRIMETH/SULFA <=20 SENSITIVE Sensitive     PIP/TAZO <=4 SENSITIVE Sensitive     * FEW ENTEROBACTER CLOACAE   Enterococcus faecalis - MIC*    AMPICILLIN <=2 SENSITIVE Sensitive     VANCOMYCIN 1 SENSITIVE Sensitive     GENTAMICIN SYNERGY SENSITIVE Sensitive     * FEW ENTEROCOCCUS FAECALIS   Klebsiella oxytoca - MIC*    AMPICILLIN >=32 RESISTANT Resistant     CEFAZOLIN 16 SENSITIVE Sensitive     CEFEPIME <=1  SENSITIVE Sensitive     CEFTAZIDIME <=1 SENSITIVE Sensitive     CEFTRIAXONE <=1 SENSITIVE Sensitive     CIPROFLOXACIN <=0.25 SENSITIVE Sensitive     GENTAMICIN <=1 SENSITIVE Sensitive     IMIPENEM <=0.25 SENSITIVE Sensitive     TRIMETH/SULFA <=20 SENSITIVE Sensitive     AMPICILLIN/SULBACTAM 4 SENSITIVE Sensitive     PIP/TAZO <=4 SENSITIVE Sensitive     Extended ESBL NEGATIVE Sensitive     * FEW KLEBSIELLA OXYTOCA      Studies: Vas [place] Upper Ext Vein Mapping (pre-op Avf)  Result Date: 07/22/2018 UPPER EXTREMITY VEIN MAPPING  Indications: Pre-access. Performing Technologist: Abram Sander RVS  Examination Guidelines: A complete evaluation includes B-mode imaging, spectral Doppler, color Doppler, and power Doppler as needed of all accessible portions of each vessel. Bilateral testing is considered an integral part of a complete examination. Limited examinations for reoccurring indications may be performed as noted. 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  *See table(s) above for measurements and observations.  Diagnosing physician: Monica Martinez MD Electronically signed by Monica Martinez MD on 07/22/2018 at 3:07:22 PM.    Final     Scheduled Meds: . aspirin EC  81 mg Oral Daily  . calcitRIOL  1 mcg Oral Once per day on Mon Wed Fri  . Chlorhexidine Gluconate Cloth  6 each Topical Q0600  . cinacalcet  30 mg Oral Q breakfast  . clopidogrel  75 mg Oral Q breakfast  . feeding supplement  1 Container Oral TID BM  . ferric citrate  630 mg Oral TID WC  . gabapentin  100 mg Oral QHS  . heparin  1,000 Units Intravenous Q M,W,F-HD  . insulin aspart  0-15 Units Subcutaneous TID WC  . insulin aspart  0-5 Units Subcutaneous QHS  . lidocaine  20 mL Infiltration Once  . midodrine  10 mg Oral Once per day on Mon Wed Fri  . multivitamin  1 tablet Oral QHS  .  pantoprazole  40 mg Oral Q0600  . sodium chloride flush  3 mL Intravenous Q12H  . sodium chloride flush  3 mL Intravenous Q12H    Continuous Infusions: . sodium chloride 10 mL/hr at 07/21/18 1900  . sodium chloride    . sodium chloride    . [START ON 07/23/2018] cefTAZidime (FORTAZ)  IV    . piperacillin-tazobactam (ZOSYN)  IV Stopped (07/22/18 0913)  . vancomycin    . [START ON 07/23/2018] vancomycin       LOS: 9 days     Kayleen Memos, MD [facility] Pager [phone]  If 7PM-7AM, please contact night-coverage [email or web address] Password [facility] 07/22/2018, 3:59 PM

## 2018-07-22 NOTE — Progress Notes (Signed)
AM CBG 12/12 65, hypoglycemia protocol initiated. Will have CBG re-checked according to protocol.

## 2018-07-22 NOTE — Progress Notes (Signed)
Upper extremity vein mapping completed.   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  Preliminary results in CV proc.   Abram Sander 07/22/2018 2:03 PM

## 2018-07-22 NOTE — Progress Notes (Signed)
[facility] Progress Note   Subjective:  Underwent PTA to RLE below the knee vessels yest w/ stent placement.  Doing well today. No c/o.    Objective Vitals:   07/21/18 1415 07/21/18 1430 07/21/18 2039 07/22/18 0341  BP: 108/75 102/71 (!) 87/59 101/63  Pulse:   93   Resp: 16 16 (!) 21 16  Temp:   98.9 F (37.2 C) 97.7 F (36.5 C)  TempSrc:   Oral Oral  SpO2:    100%  Weight:      Height:       Physical Exam General:Well appearing man, NAD Heart:RRR; no murmur Lungs:CTAB Abdomen:soft, non-tender Extremities:R foot bandaged w VAC in place; no LE edema Dialysis Access:TDC in L chest, LUA AVF in place  Additional Objective Labs: Basic Metabolic Panel: Recent Labs  Lab 07/16/18 1911 07/19/18 0745 07/21/18 0755  NA 136 137 134*  K 4.7 4.3 4.0  CL 97* 98 95*  CO2 21* 22 23  GLUCOSE 144* 114* 78  BUN 60* 48* 37*  CREATININE 11.74* 11.25* 9.50*  CALCIUM 8.3* 8.7* 8.4*  PHOS 7.2* 6.2* 4.8*   Liver Function Tests: Recent Labs  Lab 07/16/18 1911 07/19/18 0745 07/21/18 0755  ALBUMIN 2.6* 2.9* 2.7*   CBC: Recent Labs  Lab 07/16/18 1911 07/19/18 0745 07/21/18 0755  WBC 9.8 9.5 10.0  HGB 11.8* 11.2* 10.6*  HCT 38.8* 36.0* 35.5*  MCV 106.0* 106.8* 106.6*  PLT 224 209 274   Blood Culture    Component Value Date/Time   SDES TISSUE 07/15/2018 1850   SPECREQUEST SOFT TISSUE RIGHT FOOT 07/15/2018 1850   CULT  07/15/2018 1850    FEW ENTEROBACTER CLOACAE FEW ENTEROCOCCUS FAECALIS FEW KLEBSIELLA OXYTOCA NO ANAEROBES ISOLATED Performed at [facility], [address], [place], [place] [address]    REPTSTATUS 07/20/2018 FINAL 07/15/2018 1850   Medications: . sodium chloride 10 mL/hr at 07/21/18 1900  . sodium chloride    . sodium chloride    . piperacillin-tazobactam (ZOSYN)  IV 3.375 g (07/22/18 0423)   . aspirin EC  81 mg Oral Daily  . calcitRIOL  1 mcg Oral Once per day on Mon Wed Fri  . Chlorhexidine Gluconate Cloth  6 each  Topical Q0600  . cinacalcet  30 mg Oral Q breakfast  . clopidogrel  75 mg Oral Q breakfast  . feeding supplement (NEPRO CARB STEADY)  237 mL Oral Q24H  . feeding supplement (PRO-STAT SUGAR FREE 64)  30 mL Oral BID WC  . ferric citrate  630 mg Oral TID WC  . gabapentin  100 mg Oral QHS  . heparin  1,000 Units Intravenous Q M,W,F-HD  . insulin aspart  0-15 Units Subcutaneous TID WC  . insulin aspart  0-5 Units Subcutaneous QHS  . lidocaine  20 mL Infiltration Once  . midodrine  10 mg Oral Once per day on Mon Wed Fri  . multivitamin  1 tablet Oral QHS  . pantoprazole  40 mg Oral Q0600  . sodium chloride flush  3 mL Intravenous Q12H  . sodium chloride flush  3 mL Intravenous Q12H    Dialysis Orders: MWF [facility] 4h   450/600  79.5kg  3K/2Ca  Hep 5000  TDC - Calcitriol 1 mcgPO q HD  Assessment/Plan: 1.R foot osteomyelitis/ RLE PAD: Wound Cx12/3 grew Enterobacter &Klebisella.OnVanc/Ceftriaxone/Flagyl.S/p 5th metatarsal amputation and debridement12/7 &additional biopsy L 5th phalanx, delayed wound closure and toe flap12/7by [name].  Underwent percutaneous revasc procedure 07/21/18 per VVS to RLE  w/ stent x 1.   2. ESRD: HD per MWF schedule. At dry wt, BP's stable, exam stable. HD tomorrow, 1st shift in case of dc.  VVS completed f'gram 12/11 - occluded L central vessels, will need AVG on R side in future in OP setting after abx for the foot infection are completed. Cont TDC use (exchanged while here).   3. Anemia of CKD: Hgb 10.6 yest, no need ESA for now.  4. Secondary hyperparathyroidism: Ca/Phos controlled now.Continue binders and sensipar. 5. Hypotension/volume:BP soft, on midodrine pre HD as at home 6. Nutrition:Alb low, cont protein supplements. 7. Dispo: stable from renal standpoint     [name] MD [place] pager [phone]   07/22/2018, 11:33 AM

## 2018-07-22 NOTE — Progress Notes (Signed)
Pharmacy Antibiotic Note  Timothy Faulkner is a 72 y.o. male admitted on 07/13/2018 with osteomyelitis of the right foot. S/P resection of the 5th metatarsal with residual osteo per pathology. Cultures are growing enterococcus, enterobacter and klebsiella.  Pharmacy has been consulted for vanc/ceftaz dosing to allow for dosing with dialysis.   Last Vancomycin dose was given 12/9 and patient received dialysis 12/11 so will give supplemental dose now and re-start dosing with dialysis with tomorrow's session.  Plan: Vancomycin 750 mg X 1 then MWF with dialysis through 1/20 Ceftazidime 2 gm MWF with dialysis starting tomorrow through 1/20 Will continue pip/tazo through tomorrow to cover until ceftazidime starts   Height: 5\' 8"  (172.7 cm) Weight: 174 lb 13.2 oz (79.3 kg) IBW/kg (Calculated) : 68.4  Temp (24hrs), Avg:98.3 F (36.8 C), Min:97.7 F (36.5 C), Max:98.9 F (37.2 C)  Recent Labs  Lab 07/16/18 1911 07/19/18 0745 07/21/18 0755  WBC 9.8 9.5 10.0  CREATININE 11.74* 11.25* 9.50*    Estimated Creatinine Clearance: 6.8 mL/min (A) (by C-G formula based on SCr of 9.5 mg/dL (H)).    Allergies  Allergen Reactions  . No Known Allergies       Thank you for allowing pharmacy to be a part of this patient's care.  Jimmy Footman, PharmD, BCPS, [place] [facility] Clinical Pharmacist Phone: [phone] 07/22/2018 3:07 PM

## 2018-07-22 NOTE — Consult Note (Signed)
[facility]    Date of Admission:  07/13/2018     Total days of antibiotics 9               Reason for Consult: Osteomyelitis   Referring Provider: Kayleen Memos, DO Primary Care Provider: [name], MD   Assessment/Plan:  Mr. Hovater is a 72 year old male with diabetes and ESRD presenting with osteomyelitis of the right foot with cultures positive for Enterobacter cloacae, Enterococcus faecalis and Klebsiella oxytoca. Now s/p resection of the 5th metatarsal with residual osteomyelitis based on pathology report as well as vascular intervention.  He has no systemic symptoms and blood cultures have been without growth which given the presence of his ICD is comforting. He is going to require at least 6 weeks of IV therapy with dialysis for continued treatment of osteomyelitis. From a health maintenance perspective it does not appear that he has been screened for Hepatitis C.  1. Discontinue Zosyn. Start ceftazidime and vancomycin with dialysis with end date 08/30/18. 2. Wound care and follow up per Podiatry. 3. Check inflammatory markers.  4. Encouraged good control of his blood sugars and adequate nutritional intake to aid in healing. 5. Check Hepatitis C antibody.  6. Follow up in ID office.    Principal Problem:   Diabetic foot infection ([place]) Active Problems:   Essential hypertension   Automatic implantable cardioverter-defibrillator in situ   ESRD (end stage renal disease) on dialysis [facility])   Diabetic infection of right foot ([place])   Cardiomyopathy, dilated, nonischemic (HCC)   Peripheral arterial disease ([place])   Osteomyelitis of right foot ([place])   Vascular calcification   Cellulitis and abscess of foot, except toes   Acute osteomyelitis of metatarsal bone of right foot (HCC)   Diabetic ulcer of midfoot associated with diabetes mellitus due to underlying condition, with necrosis of bone ([place])   Encounter for planned post-operative wound  closure   . aspirin EC  81 mg Oral Daily  . calcitRIOL  1 mcg Oral Once per day on Mon Wed Fri  . Chlorhexidine Gluconate Cloth  6 each Topical Q0600  . cinacalcet  30 mg Oral Q breakfast  . clopidogrel  75 mg Oral Q breakfast  . feeding supplement (NEPRO CARB STEADY)  237 mL Oral Q24H  . feeding supplement (PRO-STAT SUGAR FREE 64)  30 mL Oral BID WC  . ferric citrate  630 mg Oral TID WC  . gabapentin  100 mg Oral QHS  . heparin  1,000 Units Intravenous Q M,W,F-HD  . insulin aspart  0-15 Units Subcutaneous TID WC  . insulin aspart  0-5 Units Subcutaneous QHS  . lidocaine  20 mL Infiltration Once  . midodrine  10 mg Oral Once per day on Mon Wed Fri  . multivitamin  1 tablet Oral QHS  . pantoprazole  40 mg Oral Q0600  . sodium chloride flush  3 mL Intravenous Q12H  . sodium chloride flush  3 mL Intravenous Q12H     HPI: PINK MAYE is a 72 y.o. male with previous medical history listed below and relevant for ESRD on dialysis, diabetes and peripheral artery disease presenting with the chief complaint of foot pain and concern for an infected foot ulcer that started several weeks prior to presentation. He noted worsening and drainage of pus prior to arrival.   Afebrile with no systemic symptoms in the ED. Initial x-rays with soft tissue ulceration along the lateral aspect  of the foot with no radiographic evidence of osteomyelitis. Unable to complete MRI secondary to ICD. Follow up bone scan with uptake favoring osteomyelitis. Initial wound culture positive for Klebsiella oxytoca and Enterobacter cloace. Blood cultures were without growth.   On 12/5 Podiatry performed irrigation and debridement of the right foot with metatarsal resection. Gross necrosis noted surrounding the metatarsal with pathology and cultures obtained with wound vac placement. Pathology confirmed osteomyelitis with cultures positive for Klebsiella oxytoca, Enterococcus faecalis, and Enterobacter cloacae.    Antimicrobial therapy since admission has consisted of:  Ceftriaxone 12/3 >>12/3>>12/6>>12/9 Metronidazole 12/3 >> 12/9 Piperacillin-tazobactam 12/9 >> Vancomycin 12/3 >> 12/7  Mr. Aguiniga has remained afebrile without leukocytosis. Feeling like a new man. Denies fevers, chills or sweats.    Review of Systems: Review of Systems  Constitutional: Negative for chills, fever and malaise/fatigue.  Respiratory: Negative for cough, shortness of breath and wheezing.   Cardiovascular: Negative for chest pain and leg swelling.  Gastrointestinal: Negative for abdominal pain, constipation, diarrhea, nausea and vomiting.  Skin: Negative for rash.     Past Medical History:  Diagnosis Date  . Anemia   . Arthritis    Gout  . Automatic implantable cardioverter-defibrillator in situ    [employer]  . CHF   . ESRD on dialysis [facility])    [name] 03/11/2013 (03/11/2013) dialysis M/W/F  . GERD (gastroesophageal reflux disease)   . Gout    "once a year"  . Heart murmur   . HYPERCHOLESTEROLEMIA, MIXED   . HYPERTENSION   . Macular degeneration   . 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 Pacemaker   . Peripheral arterial disease (HCC)    left fifth toe ulcer, healing  . Pneumonia   . Shortness of breath   . 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 Smoking status: Former Smoker    Packs/day: 0.75    Years: 30.00    Pack years: 22.50    Types: Cigarettes    Last attempt to quit: 11/25/1992    Years since quitting: 25.6  . Smokeless tobacco: Never Used  Substance Use Topics  . Alcohol use: Not Currently  . Drug use: Not Currently    Types: Marijuana    Family History  Problem Relation Age of Onset  . Heart disease Father   . CAD Father     Allergies  Allergen Reactions  . No Known Allergies     OBJECTIVE: Blood pressure 101/63, pulse 93, temperature 97.7 F (36.5 C), temperature source  Oral, resp. rate 16, height 5\' 8"  (1.727 m), weight 79.3 kg, SpO2 100 %.  Physical Exam Constitutional:      General: He is not in acute distress.    Appearance: He is well-developed and well-nourished.  HENT:     Mouth/Throat:     Mouth: Mucous membranes are moist.     Dentition: Abnormal dentition.  Cardiovascular:     Rate and Rhythm: Normal rate and regular rhythm.     Pulses: Intact distal pulses.     Heart sounds: Normal heart sounds. No murmur. No friction rub. No gallop.      Comments: Dialysis catheter present in left upper chest with dressing that is clean and dry without evidence of infection.  Pulmonary:     Effort: Pulmonary effort is normal. No respiratory distress.     Breath sounds: Normal breath sounds. No stridor. No wheezing  or rales.  Chest:     Chest wall: No tenderness.  Musculoskeletal:     Comments: Right foot wrapped in surgical dressing that is clean and dry. Wound vac with appropriate suction and no drainage. Sensation to touch present.   Skin:    General: Skin is warm and dry.  Neurological:     Mental Status: He is alert and oriented to person, place, and time.     Lab Results Lab Results  Component Value Date   WBC 10.0 07/21/2018   HGB 10.6 (L) 07/21/2018   HCT 35.5 (L) 07/21/2018   MCV 106.6 (H) 07/21/2018   PLT 274 07/21/2018    Lab Results  Component Value Date   CREATININE 9.50 (H) 07/21/2018   BUN 37 (H) 07/21/2018   NA 134 (L) 07/21/2018   K 4.0 07/21/2018   CL 95 (L) 07/21/2018   CO2 23 07/21/2018    Lab Results  Component Value Date   ALT 11 07/13/2018   AST 19 07/13/2018   ALKPHOS 56 07/13/2018   BILITOT 0.8 07/13/2018     Microbiology: Recent Results (from the past 240 hour(s))  Blood culture (routine x 2)     Status: None   Collection Time: 07/13/18 12:52 PM  Result Value Ref Range Status   Specimen Description BLOOD RIGHT HAND  Final   Special Requests   Final    BOTTLES DRAWN AEROBIC ONLY Blood Culture results  may not be optimal due to an inadequate volume of blood received in culture bottles   Culture   Final    NO GROWTH 5 DAYS Performed at [facility], [place] [address] [place], [place] [address]    Report Status 07/18/2018 FINAL  Final  Blood culture (routine x 2)     Status: None   Collection Time: 07/13/18 12:57 PM  Result Value Ref Range Status   Specimen Description BLOOD BLOOD RIGHT FOREARM  Final   Special Requests   Final    BOTTLES DRAWN AEROBIC AND ANAEROBIC Blood Culture results may not be optimal due to an inadequate volume of blood received in culture bottles   Culture   Final    NO GROWTH 5 DAYS Performed at [facility], [place] [address], [place], [place] [address]    Report Status 07/18/2018 FINAL  Final  WOUND CULTURE     Status: Abnormal   Collection Time: 07/13/18  1:24 PM  Result Value Ref Range Status   MICRO NUMBER: [id number]  Final   SPECIMEN QUALITY: Adequate  Final   SOURCE: NOT GIVEN  Final   STATUS: FINAL  Final   GRAM STAIN:   Final    No white blood cells seen No epithelial cells seen Many Gram positive cocci in pairs Many Gram negative bacilli   ISOLATE 1: Enterobacter cloacae complex (A)  Final    Comment: Heavy growth of Enterobacter cloacae complex   ISOLATE 2: Klebsiella oxytoca (A)  Final    Comment: Heavy growth of Klebsiella oxytoca      Susceptibility   Klebsiella oxytoca - AEROBIC CULT, GRAM STAIN NEGATIVE 2    AMOX/CLAVULANIC <=2 Sensitive     AMPICILLIN  Resistant     AMPICILLIN/SULBACTAM <=2 Sensitive     CEFAZOLIN <=4 Not Reportable     CEFEPIME <=1 Sensitive     CEFTRIAXONE <=1 Sensitive     CIPROFLOXACIN <=0.25 Sensitive     LEVOFLOXACIN <=0.12 Sensitive     ERTAPENEM <=0.5 Sensitive  GENTAMICIN <=1 Sensitive     IMIPENEM <=0.25 Sensitive     PIP/TAZO <=4 Sensitive     TOBRAMYCIN <=1 Sensitive     TRIMETH/SULFA* <=20 Sensitive      * For infections other than uncomplicated UTIcaused by E. coli, K. pneumoniae or P.  mirabilis:Cefazolin is resistant if MIC > or = 8 mcg/mL.(Distinguishing susceptible versus intermediatefor isolates with MIC < or = 4 mcg/mL requiresadditional testing.)Legend:S = Susceptible  I = IntermediateR = Resistant  NS = Not susceptible* = Not tested  NR = Not reported**NN = See antimicrobic comments   Enterobacter cloacae complex - AEROBIC CULT, GRAM STAIN NEGATIVE 1    AMOX/CLAVULANIC >=32 Resistant     CEFAZOLIN* >=64 Resistant      * For infections other than uncomplicated UTIcaused by E. coli, K. pneumoniae or P. mirabilis:Cefazolin is resistant if MIC > or = 8 mcg/mL.(Distinguishing susceptible versus intermediatefor isolates with MIC < or = 4 mcg/mL requiresadditional testing.)    CEFEPIME <=1 Sensitive     CEFTRIAXONE <=1 Sensitive     CIPROFLOXACIN <=0.25 Sensitive     LEVOFLOXACIN <=0.12 Sensitive     ERTAPENEM <=0.5 Sensitive     GENTAMICIN <=1 Sensitive     IMIPENEM <=0.25 Sensitive     PIP/TAZO <=4 Sensitive     TOBRAMYCIN <=1 Sensitive     TRIMETH/SULFA <=20 Sensitive   Surgical pcr screen     Status: None   Collection Time: 07/13/18  9:11 PM  Result Value Ref Range Status   MRSA, PCR NEGATIVE NEGATIVE Final   Staphylococcus aureus NEGATIVE NEGATIVE Final    Comment: (NOTE) The Xpert SA Assay (FDA approved for NASAL specimens in patients 14 years of age and older), is one component of a comprehensive surveillance program. It is not intended to diagnose infection nor to guide or monitor treatment. Performed at [facility], [place] [address] [place], [place] [address]   Aerobic/Anaerobic Culture (surgical/deep wound)     Status: None   Collection Time: 07/15/18  6:47 PM  Result Value Ref Range Status   Specimen Description BONE TISSUE  Final   Special Requests METATARSAL BONE FROM RIGHT FOOT  Final   Gram Stain   Final    MODERATE WBC PRESENT,BOTH PMN AND MONONUCLEAR NO ORGANISMS SEEN    Culture   Final    RARE ENTEROBACTER CLOACAE RARE ENTEROCOCCUS  FAECALIS NO ANAEROBES ISOLATED Performed at [facility], [place] [address], [place], [place] [address]    Report Status 07/20/2018 FINAL  Final   Organism ID, Bacteria ENTEROBACTER CLOACAE  Final   Organism ID, Bacteria ENTEROCOCCUS FAECALIS  Final      Susceptibility   Enterobacter cloacae - MIC*    CEFAZOLIN >=64 RESISTANT Resistant     CEFEPIME <=1 SENSITIVE Sensitive     CEFTAZIDIME <=1 SENSITIVE Sensitive     CEFTRIAXONE <=1 SENSITIVE Sensitive     CIPROFLOXACIN <=0.25 SENSITIVE Sensitive     GENTAMICIN <=1 SENSITIVE Sensitive     IMIPENEM 0.5 SENSITIVE Sensitive     TRIMETH/SULFA <=20 SENSITIVE Sensitive     PIP/TAZO <=4 SENSITIVE Sensitive     * RARE ENTEROBACTER CLOACAE   Enterococcus faecalis - MIC*    AMPICILLIN <=2 SENSITIVE Sensitive     VANCOMYCIN 1 SENSITIVE Sensitive     GENTAMICIN SYNERGY SENSITIVE Sensitive     * RARE ENTEROCOCCUS FAECALIS  Aerobic/Anaerobic Culture (surgical/deep wound)     Status: None   Collection Time: 07/15/18  6:50  PM  Result Value Ref Range Status   Specimen Description TISSUE  Final   Special Requests SOFT TISSUE RIGHT FOOT  Final   Gram Stain   Final    FEW WBC PRESENT,BOTH PMN AND MONONUCLEAR RARE GRAM NEGATIVE RODS    Culture   Final    FEW ENTEROBACTER CLOACAE FEW ENTEROCOCCUS FAECALIS FEW KLEBSIELLA OXYTOCA NO ANAEROBES ISOLATED Performed at [facility], [address] [place], [place] [address]    Report Status 07/20/2018 FINAL  Final   Organism ID, Bacteria ENTEROBACTER CLOACAE  Final   Organism ID, Bacteria ENTEROCOCCUS FAECALIS  Final   Organism ID, Bacteria KLEBSIELLA OXYTOCA  Final      Susceptibility   Enterobacter cloacae - MIC*    CEFAZOLIN >=64 RESISTANT Resistant     CEFEPIME <=1 SENSITIVE Sensitive     CEFTAZIDIME <=1 SENSITIVE Sensitive     CEFTRIAXONE <=1 SENSITIVE Sensitive     CIPROFLOXACIN <=0.25 SENSITIVE Sensitive     GENTAMICIN <=1 SENSITIVE Sensitive     IMIPENEM <=0.25 SENSITIVE  Sensitive     TRIMETH/SULFA <=20 SENSITIVE Sensitive     PIP/TAZO <=4 SENSITIVE Sensitive     * FEW ENTEROBACTER CLOACAE   Enterococcus faecalis - MIC*    AMPICILLIN <=2 SENSITIVE Sensitive     VANCOMYCIN 1 SENSITIVE Sensitive     GENTAMICIN SYNERGY SENSITIVE Sensitive     * FEW ENTEROCOCCUS FAECALIS   Klebsiella oxytoca - MIC*    AMPICILLIN >=32 RESISTANT Resistant     CEFAZOLIN 16 SENSITIVE Sensitive     CEFEPIME <=1 SENSITIVE Sensitive     CEFTAZIDIME <=1 SENSITIVE Sensitive     CEFTRIAXONE <=1 SENSITIVE Sensitive     CIPROFLOXACIN <=0.25 SENSITIVE Sensitive     GENTAMICIN <=1 SENSITIVE Sensitive     IMIPENEM <=0.25 SENSITIVE Sensitive     TRIMETH/SULFA <=20 SENSITIVE Sensitive     AMPICILLIN/SULBACTAM 4 SENSITIVE Sensitive     PIP/TAZO <=4 SENSITIVE Sensitive     Extended ESBL NEGATIVE Sensitive     * FEW KLEBSIELLA OXYTOCA     Terri Piedra, NP [facility] [phone] Pager  07/22/2018  12:34 PM

## 2018-07-22 NOTE — Progress Notes (Signed)
CBG re-check was 84. Will pass information to day shift nurse for further monitoring.

## 2018-07-22 NOTE — Progress Notes (Addendum)
  Progress Note    07/22/2018 7:27 AM 1 Day Post-Op  Subjective:  Patient says R foot feels better.   Vitals:   07/21/18 2039 07/22/18 0341  BP: (!) 87/59 101/63  Pulse: 93   Resp: (!) 21 16  Temp: 98.9 F (37.2 C) 97.7 F (36.5 C)  SpO2:  100%   Physical Exam: Lungs:  Non labored Incisions:  L arm cath site without hematoma; L groin cath site soft, without hematoma or bleeding; R peroneal cath site without bleeding Extremities:  Brisk peroneal and PT signal by doppler; wound vac left in place Abdomen:  Soft Neurologic: A&O  CBC    Component Value Date/Time   WBC 10.0 07/21/2018 0755   RBC 3.33 (L) 07/21/2018 0755   HGB 10.6 (L) 07/21/2018 0755   HGB 15.2 05/11/2018 1219   HCT 35.5 (L) 07/21/2018 0755   PLT 274 07/21/2018 0755   PLT 245 05/11/2018 1219   MCV 106.6 (H) 07/21/2018 0755   MCH 31.8 07/21/2018 0755   MCHC 29.9 (L) 07/21/2018 0755   RDW 16.5 (H) 07/21/2018 0755   LYMPHSABS 0.8 07/13/2018 1252   MONOABS 0.6 07/13/2018 1252   EOSABS 0.1 07/13/2018 1252   BASOSABS 0.0 07/13/2018 1252    BMET    Component Value Date/Time   NA 134 (L) 07/21/2018 0755   K 4.0 07/21/2018 0755   CL 95 (L) 07/21/2018 0755   CO2 23 07/21/2018 0755   GLUCOSE 78 07/21/2018 0755   BUN 37 (H) 07/21/2018 0755   CREATININE 9.50 (H) 07/21/2018 0755   CREATININE 9.49 (HH) 05/11/2018 1219   CALCIUM 8.4 (L) 07/21/2018 0755   GFRNONAA 5 (L) 07/21/2018 0755   GFRNONAA 5 (L) 05/11/2018 1219   GFRAA 6 (L) 07/21/2018 0755   GFRAA 6 (L) 05/11/2018 1219    INR    Component Value Date/Time   INR 1.24 03/12/2013 0553     Intake/Output Summary (Last 24 hours) at 07/22/2018 0727 Last data filed at 07/22/2018 0500 Gross per 24 hour  Intake 637.64 ml  Output 1700 ml  Net -1062.36 ml     Assessment/Plan:  72 y.o. male is s/p L arm fistulogram and angioplasty of popliteal, tp trunk, and peroneal artery with stenting of tp trunk and peroneal 1 Day Post-Op   Brisk peroneal  and distal PT signals this morning Defer wound care R foot to Podiatry Patient will likely need R arm AV graft having had several access procedures in R arm AV graft can be scheduled as outpatient after IV abx regimen has been completed   Dagoberto Ligas, PA-C [facility] [phone] 07/22/2018 7:27 AM  Agree with the above Needs access, can be done as an outpatient Needs rx for plavix at d/c [facility]

## 2018-07-22 NOTE — Progress Notes (Signed)
Occupational Therapy Treatment Patient Details Name: Timothy Faulkner MRN: [id number] DOB: 1945-11-04 Today's Date: 07/22/2018    History of present illness Patient is a 72 y/o male presenting to the ED on 07/13/18 with foot ulcer. I&D of R foot on 07/14/18. Repeat I&D and 5th metatarsal recection with application of wound vac on 07/15/18. Repeat I&D and wound closure on 07/17/18. PMH significant for DM; diabetic foot ulcer/PAD; HTN; HLD; ESRD on MWF HD; and CHF with AICD.    OT comments  Pt making good progress with functional goals. OT will continue to follow acutely  Follow Up Recommendations  No OT follow up;Supervision/Assistance - 24 hour    Equipment Recommendations  3 in 1 bedside commode    Recommendations for Other Services      Precautions / Restrictions Precautions Precautions: Fall Precaution Comments: wound vac Required Braces or Orthoses: Other Brace Other Brace: post-op shoe Restrictions Weight Bearing Restrictions: Yes(R LE) Other Position/Activity Restrictions: WB through R heel only       Mobility Bed Mobility               General bed mobility comments: pt sitting EOB upon arrival  Transfers Overall transfer level: Needs assistance Equipment used: Rolling walker (2 wheeled) Transfers: Sit to/from Stand Sit to Stand: Min guard;Min assist Stand pivot transfers: Min guard       General transfer comment: light min assist to boost up to standing    Balance Overall balance assessment: Needs assistance Sitting-balance support: No upper extremity supported;Feet supported Sitting balance-Leahy Scale: Good     Standing balance support: Bilateral upper extremity supported;During functional activity;Single extremity supported Standing balance-Leahy Scale: Poor                             ADL either performed or assessed with clinical judgement   ADL Overall ADL's : Needs assistance/impaired     Grooming: Set  up;Supervision/safety;Standing;Wash/dry hands;Wash/dry face   Upper Body Bathing: Sitting;Supervision/ safety       Upper Body Dressing : Supervision/safety;Sitting   Lower Body Dressing: Sit to/from stand;Min guard   Toilet Transfer: Minimal assistance;Ambulation;Min guard;RW;Grab bars;Comfort height toilet   Toileting- Clothing Manipulation and Hygiene: Sit to/from stand;Min guard       Functional mobility during ADLs: Min guard       Vision Patient Visual Report: No change from baseline     Perception     Praxis      Cognition   Behavior During Therapy: WFL for tasks assessed/performed Overall Cognitive Status: Within Functional Limits for tasks assessed                                          Exercises     Shoulder Instructions       General Comments      Pertinent Vitals/ Pain       Pain Assessment: 0-10 Pain Score: 5  Pain Location: R foot Pain Descriptors / Indicators: Sore Pain Intervention(s): Monitored during session;Premedicated before session;Repositioned  Home Living                                          Prior Functioning/Environment              Frequency  Min 2X/week        Progress Toward Goals  OT Goals(current goals can now be found in the care plan section)  Progress towards OT goals: Progressing toward goals     Plan Discharge plan remains appropriate    Co-evaluation                 AM-PAC OT "6 Clicks" Daily Activity     Outcome Measure   Help from another person eating meals?: None Help from another person taking care of personal grooming?: None Help from another person toileting, which includes using toliet, bedpan, or urinal?: A Little Help from another person bathing (including washing, rinsing, drying)?: A Little Help from another person to put on and taking off regular upper body clothing?: A Little Help from another person to put on and taking off regular  lower body clothing?: None 6 Click Score: 21    End of Session Equipment Utilized During Treatment: Gait belt;Rolling walker  OT Visit Diagnosis: Other abnormalities of gait and mobility (R26.89)   Activity Tolerance Patient tolerated treatment well   Patient Left with call bell/phone within reach;in bed(sitting EOB)   Nurse Communication          Time: 3825-0539 OT Time Calculation (min): 26 min  Charges: OT General Charges $OT Visit: 1 Visit OT Treatments $Self Care/Home Management : 8-22 mins $Therapeutic Activity: 8-22 mins     Britt Bottom 07/22/2018, 12:46 PM

## 2018-07-22 NOTE — Progress Notes (Signed)
Discussed with Dr. Trula Slade and will tentatively plan for right upper extremity venogram tomorrow to evaluate for future dialysis access.  Previously had trouble getting RIJ wire to advance and now has LIJ tunneled catheter.  [name], MD [facility] Office: [phone] Pager: [place]

## 2018-07-22 NOTE — Care Management Important Message (Signed)
Important Message  Patient Details  Name: Timothy Faulkner MRN: [id number] Date of Birth: 1946-06-14   Medicare Important Message Given:  Yes    [name] [name] 07/22/2018, 2:38 PM

## 2018-07-22 NOTE — Progress Notes (Addendum)
Nutrition Follow Up  DOCUMENTATION CODES:   Not applicable  INTERVENTION:    Boost Breeze po TID, each supplement provides 250 kcal and 9 grams of protein  Renal MVI daily   D/C Nepro Shake & Prostat liquid protein  NUTRITION DIAGNOSIS:   Increased nutrient needs related to wound healing as evidenced by estimated needs, ongoing  GOAL:   Patient will meet greater than or equal to 90% of their needs, progressing  MONITOR:   PO intake, Supplement acceptance, Labs, Skin  ASSESSMENT:   72 yo male with PMH of ESRD-on HD, DM-2, HTN, PVD, HLD, CHF, AICD who was admitted on 12/3 with AMS and drainage from right foot ulcer.  Pt s/p procedures 12/11: Right below-knee popliteal artery, tibioperoneal trunk & peroneal artery angioplasty  Pt continues on a HH/Carbohydrate Modified diet. Appetite good. PO intake 65-75% per flowsheet records. Pt doesn't like his Prostat or Nepro Shake nutrition supplements.  VAC dressing applied to R foot wound per [facility] 12/11. Labs & medications reviewed. Na 134 (L). CBG's [phone].  ESRD. Pt s/p HD yesterday.  Diet Order:   Diet Order            Diet heart healthy/carb modified Room service appropriate? Yes; Fluid consistency: Thin  Diet effective now             EDUCATION NEEDS:   Not appropriate for education at this time  Skin:  Skin Assessment: Skin Integrity Issues: Skin Integrity Issues:: Wound VAC Wound Vac: R foot Other: full thickness post op wound  Last BM:  12/9  Height:   Ht Readings from Last 1 Encounters:  07/15/18 5\' 8"  (1.727 m)   Weight:   Wt Readings from Last 1 Encounters:  07/21/18 79.3 kg   Ideal Body Weight:  70 kg  BMI:  Body mass index is 26.58 kg/m.  Estimated Nutritional Needs:   Kcal:  2200-2500  Protein:  110-120 gm  Fluid:  1 L  Arthur Holms, RD, LDN Pager #: [phone] After-Hours Pager #: [phone]

## 2018-07-22 NOTE — Telephone Encounter (Signed)
LMOVM reminding pt to send remote transmission.   

## 2018-07-23 ENCOUNTER — Encounter: Payer: Self-pay | Admitting: Cardiology

## 2018-07-23 ENCOUNTER — Encounter (HOSPITAL_COMMUNITY)
Admission: EM | Disposition: A | Discharge status: Home Health | Payer: Self-pay | Source: Home / Self Care | Attending: Internal Medicine

## 2018-07-23 HISTORY — PX: UPPER EXTREMITY VENOGRAPHY: CATH118272

## 2018-07-23 LAB — BASIC METABOLIC PANEL
Anion gap: 16 — ABNORMAL HIGH (ref 5–15)
BUN: 33 mg/dL — ABNORMAL HIGH (ref 8–23)
CALCIUM: 8.5 mg/dL — AB (ref 8.9–10.3)
CO2: 23 mmol/L (ref 22–32)
Chloride: 93 mmol/L — ABNORMAL LOW (ref 98–111)
Creatinine, Ser: 9.6 mg/dL — ABNORMAL HIGH (ref 0.61–1.24)
GFR calc Af Amer: 6 mL/min — ABNORMAL LOW (ref >60)
GFR calc non Af Amer: 5 mL/min — ABNORMAL LOW (ref >60)
Glucose, Bld: 104 mg/dL — ABNORMAL HIGH (ref 70–99)
Potassium: 3.8 mmol/L (ref 3.5–5.1)
Sodium: 132 mmol/L — ABNORMAL LOW (ref 135–145)

## 2018-07-23 LAB — GLUCOSE, CAPILLARY
GLUCOSE-CAPILLARY: 117 mg/dL — AB (ref 70–99)
Glucose-Capillary: 72 mg/dL (ref 70–99)
Glucose-Capillary: 64 mg/dL — ABNORMAL LOW (ref 70–99)
Glucose-Capillary: 73 mg/dL (ref 70–99)
Glucose-Capillary: 93 mg/dL (ref 70–99)

## 2018-07-23 LAB — CBC
HCT: 33.5 % — ABNORMAL LOW (ref 39.0–52.0)
Hemoglobin: 10.1 g/dL — ABNORMAL LOW (ref 13.0–17.0)
MCH: 32.2 pg (ref 26.0–34.0)
MCHC: 30.1 g/dL (ref 30.0–36.0)
MCV: 106.7 fL — ABNORMAL HIGH (ref 80.0–100.0)
Platelets: 284 10*3/uL (ref 150–400)
RBC: 3.14 MIL/uL — ABNORMAL LOW (ref 4.22–5.81)
RDW: 16.7 % — ABNORMAL HIGH (ref 11.5–15.5)
WBC: 9.3 10*3/uL (ref 4.0–10.5)
nRBC: 0.3 % — ABNORMAL HIGH (ref 0.0–0.2)

## 2018-07-23 LAB — SEDIMENTATION RATE: Sed Rate: 104 mm/hr — ABNORMAL HIGH (ref 0–16)

## 2018-07-23 LAB — C-REACTIVE PROTEIN: CRP: 11.5 mg/dL — ABNORMAL HIGH (ref <1.0)

## 2018-07-23 SURGERY — UPPER EXTREMITY VENOGRAPHY
Anesthesia: LOCAL | Laterality: Right

## 2018-07-23 MED ORDER — VANCOMYCIN HCL IN DEXTROSE 750-5 MG/150ML-% IV SOLN
INTRAVENOUS | Status: AC
  Filled 2018-07-23: qty 150

## 2018-07-23 MED ORDER — VANCOMYCIN HCL IN DEXTROSE 750-5 MG/150ML-% IV SOLN: 750.0000 mg | INTRAVENOUS | 0 refills | Status: AC

## 2018-07-23 MED ORDER — PANTOPRAZOLE SODIUM 40 MG PO TBEC: 40.0000 mg | DELAYED_RELEASE_TABLET | Freq: Every day | ORAL | 0 refills | Status: DC

## 2018-07-23 MED ORDER — CALCITRIOL 0.5 MCG PO CAPS: 1.0000 ug | ORAL_CAPSULE | ORAL | 0 refills | Status: DC

## 2018-07-23 MED ORDER — SODIUM CHLORIDE 0.9 % IV SOLN: 2.0000 g | INTRAVENOUS | 0 refills | Status: AC

## 2018-07-23 MED ORDER — IODIXANOL 320 MG/ML IV SOLN
INTRAVENOUS | Status: DC | PRN
  Administered 2018-07-23: 25 mL via INTRAVENOUS

## 2018-07-23 MED ORDER — CLOPIDOGREL BISULFATE 75 MG PO TABS: 75.0000 mg | ORAL_TABLET | Freq: Every day | ORAL | 0 refills | Status: DC

## 2018-07-23 MED ORDER — HEPARIN SODIUM (PORCINE) 1000 UNIT/ML IJ SOLN
INTRAMUSCULAR | Status: AC
  Administered 2018-07-23: 4200 [IU] via INTRAVENOUS
  Filled 2018-07-23: qty 5

## 2018-07-23 MED ORDER — HEPARIN SODIUM (PORCINE) 1000 UNIT/ML IJ SOLN
INTRAMUSCULAR | Status: AC
  Filled 2018-07-23: qty 2

## 2018-07-23 MED ORDER — CALCITRIOL 0.5 MCG PO CAPS
ORAL_CAPSULE | ORAL | Status: AC
  Filled 2018-07-23: qty 2

## 2018-07-23 MED ORDER — RENA-VITE PO TABS: 1.0000 | ORAL_TABLET | Freq: Every day | ORAL | 0 refills | Status: DC

## 2018-07-23 MED ORDER — HEPARIN SODIUM (PORCINE) 1000 UNIT/ML DIALYSIS
2000.0000 [IU] | INTRAMUSCULAR | Status: DC | PRN
  Filled 2018-07-23: qty 2

## 2018-07-23 SURGICAL SUPPLY — 2 items
STOPCOCK MORSE 400PSI 3WAY (MISCELLANEOUS) ×1 IMPLANT
TUBING CIL FLEX 10 FLL-RA (TUBING) ×1 IMPLANT

## 2018-07-23 NOTE — Progress Notes (Signed)
Patient off unit in hemodialysis, vital signs and assessment to be completed upon return to unit.

## 2018-07-23 NOTE — Procedures (Signed)
For dc today, to get 6 wks IV abx which would be 5 more weeks (surgery was 1 wk ago) per ID rec's w/ Vanc/ Fortaz at OP HD.  We will arrange.   I was present at this dialysis session, have reviewed the session itself and made  appropriate changes [name] MD [place] pager [phone]   07/23/2018, 3:23 PM

## 2018-07-23 NOTE — Progress Notes (Addendum)
Patient states he needs rolling walker prior to dc. His daughter checked yesterday and he does not have one. NCM contacted James with [facility] , he will bring rolling walker to patient's room prior to dc. Wound Vac has been dc'd

## 2018-07-23 NOTE — Consult Note (Addendum)
WOC follow-up:  Vac was discontinued yesterday by podiatry according to the patient; Dr March Rummage has now ordered Santyl ointment daily for bedside nurses to perform.  Please refer to their team for further questions regarding plan of care for right foot. Please re-consult if further assistance is needed.  Thank-you,  Julien Girt MSN, [place], [place], [place], [place]

## 2018-07-23 NOTE — Interval H&P Note (Signed)
History and Physical Interval Note:  07/23/2018 2:33 PM  Nelva Nay  has presented today for surgery, with the diagnosis of pvd  The various methods of treatment have been discussed with the patient and family. After consideration of risks, benefits and other options for treatment, the patient has consented to  Procedure(s): UPPER EXTREMITY VENOGRAPHY (Right) as a surgical intervention .  The patient's history has been reviewed, patient examined, no change in status, stable for surgery.  I have reviewed the patient's chart and labs.  Questions were answered to the patient's satisfaction.     Deitra Mayo

## 2018-07-23 NOTE — Progress Notes (Signed)
  Progress Note    07/23/2018 10:04 AM   Subjective:  States right foot feels much better since retrograde peroneal intervention with angioplasty.  IN dialysis this am.    Vitals:   07/23/18 0900 07/23/18 0930  BP: 106/73 100/70  Pulse: 96 98  Resp:    Temp:    SpO2:     Physical Exam: Incisions:  L groin cath site soft, without hematoma or bleeding; R peroneal cath site without bleeding Extremities:  Peroneal and PT signal by doppler in right foot   CBC    Component Value Date/Time   WBC 9.3 07/23/2018 0643   RBC 3.14 (L) 07/23/2018 0643   HGB 10.1 (L) 07/23/2018 0643   HGB 15.2 05/11/2018 1219   HCT 33.5 (L) 07/23/2018 0643   PLT 284 07/23/2018 0643   PLT 245 05/11/2018 1219   MCV 106.7 (H) 07/23/2018 0643   MCH 32.2 07/23/2018 0643   MCHC 30.1 07/23/2018 0643   RDW 16.7 (H) 07/23/2018 0643   LYMPHSABS 0.8 07/13/2018 1252   MONOABS 0.6 07/13/2018 1252   EOSABS 0.1 07/13/2018 1252   BASOSABS 0.0 07/13/2018 1252    BMET    Component Value Date/Time   NA 132 (L) 07/23/2018 0643   K 3.8 07/23/2018 0643   CL 93 (L) 07/23/2018 0643   CO2 23 07/23/2018 0643   GLUCOSE 104 (H) 07/23/2018 0643   BUN 33 (H) 07/23/2018 0643   CREATININE 9.60 (H) 07/23/2018 0643   CREATININE 9.49 (HH) 05/11/2018 1219   CALCIUM 8.5 (L) 07/23/2018 0643   GFRNONAA 5 (L) 07/23/2018 0643   GFRNONAA 5 (L) 05/11/2018 1219   GFRAA 6 (L) 07/23/2018 0643   GFRAA 6 (L) 05/11/2018 1219    INR    Component Value Date/Time   INR 1.24 03/12/2013 0553     Intake/Output Summary (Last 24 hours) at 07/23/2018 1004 Last data filed at 07/23/2018 0000 Gross per 24 hour  Intake 801.41 ml  Output -  Net 801.41 ml     Assessment/Plan:  72 y.o. male is s/p L arm fistulogram and angioplasty of popliteal, tp trunk, and peroneal artery with stenting of tp trunk and peroneal.  Will need Plavix at discharge for right leg intervention with peroneal/TP trunk stents. Right upper extremity venogram  this afternoon by Dr. Scot Dock to evaluate for future access.      [name], MD [facility] Office: [phone] Pager: [place]

## 2018-07-23 NOTE — Progress Notes (Addendum)
Subjective:  Seen on Hd  No cos  Objective Vital signs in last 24 hours: Vitals:   07/23/18 1420 07/23/18 1425 07/23/18 1425 07/23/18 1430  BP:      Pulse:  (!) 104 (!) 107 (!) 102  Resp:  12 11 11   Temp:      TempSrc:      SpO2: 98% 100% 94% 97%  Weight:      Height:       Weight change:   Physical Exam General: ON hd ,Well appearing man, NAD Heart:RRR; no murmur Lungs:CTAB anter.  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.  Underwent percutaneous revasc procedure 07/21/18 per [facility] to RLE w/ stent x 1.    NOTED PER ID  6 wk VAnco / Tressie Ellis on HD  2. ESRD: HD per MWF schedule., BP's stable, . HD now  Tolerating  HD ,  LUA  AVF  Clotted =[facility] eval for access with  Dr Arcola Jansky Venogram Right extrem  After hd .with ho  completed f'gram 12/11 - occluded L central vessels, / per [facility] AVG on R side in future in OP setting after abx for the foot infection are completed. Cont TDC use (exchanged while here).   3. Anemia of CKD: Hgb 10.6 > 10.1 this am, no need ESA for now.  4. Secondary hyperparathyroidism: Ca/Phos controlled now.Continue binders and sensipar. 5. Hypotension/volume:BP soft, on midodrine pre HD as at home 6. Nutrition:Alb low, cont protein supplements. 7. Dispo: stable from renal standpoint  [name], PA-C [place] [phone] 07/23/2018,3:32 PM  LOS: 10 days   Pt seen, examined and agree w A/P as above.  [name] MD [facility] pager  [phone]   07/23/2018, 4:11 PM    Labs: Basic Metabolic Panel: Recent Labs  Lab 07/16/18 1911 07/19/18 0745 07/21/18 0755 07/23/18 0643  NA 136 137 134* 132*  K 4.7 4.3 4.0 3.8  CL 97* 98 95* 93*  CO2 21* 22 23 23   GLUCOSE 144* 114* 78 104*  BUN 60* 48* 37* 33*  CREATININE 11.74* 11.25* 9.50* 9.60*  CALCIUM 8.3* 8.7* 8.4* 8.5*  PHOS 7.2* 6.2* 4.8*  --    Liver Function Tests: Recent Labs  Lab 07/16/18 1911 07/19/18 0745 07/21/18 0755  ALBUMIN 2.6* 2.9* 2.7*   No results for input(s): LIPASE, AMYLASE in the last 168 hours. No results for input(s): AMMONIA in the last 168 hours. CBC: Recent Labs  Lab 07/16/18 1911 07/19/18 0745 07/21/18 0755 07/23/18 0643  WBC 9.8 9.5 10.0 9.3  HGB 11.8* 11.2* 10.6* 10.1*  HCT 38.8* 36.0* 35.5* 33.5*  MCV 106.0* 106.8* 106.6* 106.7*  PLT 224 209 274 284   Cardiac Enzymes: No results for input(s): CKTOTAL, CKMB, CKMBINDEX, TROPONINI in the last 168 hours. CBG: Recent Labs  Lab 07/22/18 2119 07/23/18 0635 07/23/18 1239 07/23/18 1301 07/23/18 1402  GLUCAP 129* 72 64* 73 93    Studies/Results: Vas [place] Upper Ext Vein Mapping (pre-op Avf)  Result Date: 07/22/2018 UPPER EXTREMITY VEIN MAPPING  Indications: Pre-access. Performing  Technologist: Abram Sander RVS  Examination Guidelines: A complete evaluation includes B-mode imaging, spectral Doppler, color Doppler, and power Doppler as needed of all accessible portions of each vessel. Bilateral testing is considered an integral part of a complete examination. Limited examinations for reoccurring indications may be performed as noted. 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  *See table(s) above for measurements and observations.  Diagnosing physician: Monica Martinez MD Electronically signed by Monica Martinez MD on 07/22/2018 at 3:07:22 PM.     Final    Medications: . sodium chloride 10 mL/hr at 07/23/18 0036  . sodium chloride    . sodium chloride    . cefTAZidime (FORTAZ)  IV Stopped (07/23/18 1321)  . Vancomycin    . vancomycin Stopped (07/23/18 1328)   . aspirin EC  81 mg Oral Daily  . calcitRIOL  1 mcg Oral Once per day on Mon Wed Fri  . Chlorhexidine Gluconate Cloth  6 each Topical Q0600  . cinacalcet  30 mg Oral Q breakfast  . clopidogrel  75 mg Oral Q breakfast  . collagenase   Topical Daily  . feeding supplement  1 Container Oral TID BM  . ferric citrate  630 mg Oral TID WC  . gabapentin  100 mg Oral QHS  . heparin      . heparin      . heparin  1,000 Units Intravenous Q M,W,F-HD  . insulin aspart  0-15 Units Subcutaneous TID WC  . insulin aspart  0-5 Units Subcutaneous QHS  . lidocaine  20 mL Infiltration Once  . midodrine  10 mg Oral Once per day on Mon Wed Fri  . multivitamin  1 tablet Oral QHS  . pantoprazole  40 mg Oral Q0600  . sodium chloride flush  3 mL Intravenous Q12H  . sodium chloride flush  3 mL Intravenous Q12H

## 2018-07-23 NOTE — Progress Notes (Signed)
CRITICAL VALUE ALERT  Critical Value:  CBG 64  Date & Time Notied:  07/23/18 1239  Provider Notified: 07/23/18 1402  Orders Received/Actions taken: Provider made aware.  Repeat CBG at 1301 = 73.  Post-prandial CBG at 1402 = 93.

## 2018-07-23 NOTE — Discharge Summary (Signed)
Discharge Summary  Timothy Faulkner GBT:[id number] DOB: June 09, 1946  PCP: [name], MD  Admit date: 07/13/2018 Discharge date: 07/23/2018  Time spent: 35 minutes  Recommendations for Outpatient Follow-up:  1. Follow-up with vascular surgery 2. Follow-up with infectious disease within a week 3. Follow-up with nephrology 4. Follow-up with cardiology 5. Keep your hemodialysis appointment 6. Continue physical therapy 7. Fall precautions  Per ID: Cultures have yielded Enterobacter and Enterococcus faecalis is along with Klebsiella.  The pathology specimen showed that the margins were not clear and there was still unhealthy bone involving the margins.  We will give him a 6-week course of "mop up" antibiotics in the form of vancomycin and ceftazidime which can be given with hemodialysis.  His wound will need to be watched carefully and one should have a low threshold to amputate more proximally to avoid him becoming systemically ill and bacteremic in particular also in the context of him having an intracardiac device.   Discharge Diagnoses:  Active Hospital Problems   Diagnosis Date Noted  . Diabetic foot infection ([place]) 07/13/2018  . Encounter for planned post-operative wound closure   . Acute osteomyelitis of metatarsal bone of right foot ([place])   . 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 Osteomyelitis of right foot ([place])   . Vascular calcification   . Cellulitis and abscess of foot, except toes   . Cardiomyopathy, dilated, nonischemic ([place]) 07/13/2018  . Peripheral arterial disease ([place])   . Diabetic infection of right foot ([place]) 07/13/2014  . ESRD (end stage renal disease) on dialysis ([place]) 11/11/2013  . Automatic implantable cardioverter-defibrillator in situ 10/01/2010  . Essential hypertension 05/03/2010    Resolved Hospital Problems   Diagnosis Date Noted Date Resolved  . Chronic kidney  disease (CKD), stage IV (severe) ([place]) 11/25/2012 07/13/2018    Discharge Condition: Stable  Diet recommendation: Resume previous diet.  Renal dialysis diet.  Vitals:   07/23/18 1100 07/23/18 1130  BP: (P) 112/82 (P) 103/70  Pulse: (P) 98 (!) (P) 102  Resp:    Temp:    SpO2:      History of present illness:  Timothy L Gilmoreis a 72 y.o.malewith medical history significant oftype II DM; diabetic foot ulcer/PAD; HTN; HLD; ESRD on HD MWF; and chronic systolic CHF with AICD presenting with right foot pain.He started with an ulcer that opened on Saturday, although he had a sore there for weeks. He was admitted for possible osteomyelitis of the right foot.  POD#0 on 07/21/18 post right lower extremity angioplasty below the knee vessels with stent placement by vascular surgery.  Infectious disease was consulted and recommended 6 weeks of Fortaz and IV vancomycin to be administered at dialysis center after dialysis session.  End date of IV antibiotic course is 08/30/2018 as recommended by infectious disease  07/23/2018: Patient seen and examined at his bedside at the dialysis center.  No acute events overnight.  He has no new complaints and denies pain in his right foot.  Vascular surgery is following.  On the day of discharge, the patient was hemodynamically stable.  He will need to follow-up with podiatry, vascular surgery, nephrology and his PCP posthospitalization.    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 He Underwent wound debridement again on 12/7.Wound vac was removed on 12/7, placed back on 12/11. Wound culture growing Enterobacter cloacae, enterococcus faecalis, and Klebsiella oxytoca Infectious disease consulted and followed Continue ceftazidime and IV vancomycin with dialysis with end date 08/30/2018 as recommended by infectious disease Wound VAC has been discontinued by podiatry [name] has ordered Santyl ointment daily for bedside nurses to perform Follow-up with infectious disease outpatient  Peripheral artery disease status post right lower extremity angioplasty below the knee vessels with stent placement and L arm fistulogram  Vascular surgery following Follow up with vascular surgery outpatient Continue ASA and Plavix Possible Right upper venogram this afternoon to assess for possible future dialysis access  End-stage renal disease on hemodialysis Monday Wednesday Friday Underwent [facility] exchange by interventional radiology HD planned 07/23/2018 inpatient  Chronic systolic CHF Last 2D echo done on August 2014 revealed LVEF 20 to 25% Continue cardiac medications Continue strict I's and O's and daily weight  Anemia of chronic disease secondary to end-stage renal disease Continue supplement per nephro  Hypotension Blood pressure stable On Midodrine  Coronary artery disease/peripheral vascular disease Continue aspirin and Plavix  Ambulatory dysfunction/physical debility PT  recommended home health PT Fall precautions    Code Status:full code.     Consultants:  Podiatry.Dr March Rummage.  Nephrology  Vascular surgeryDr Trula Slade  Infectious disease Dr. Lucianne Lei dam    Procedures:Metatarsectomy, bone biopsy of the fifth metatarsal wound VAC placement on 07/15/2018 by Dr. Marvis Moeller and fistulogram   Antimicrobials: Tressie Ellis and IV vancomycin   Discharge Exam: BP (P) 103/70   Pulse (!) (P) 102   Temp 98 F (36.7 C) (Oral)   Resp 16   Ht 5\' 8"  (1.727 m)   Wt 80.8 kg   SpO2 98%   BMI 27.08 kg/m  . General: 72 y.o. year-old male well developed well nourished in no acute distress.  Alert and oriented x3. . Cardiovascular: Regular rate and rhythm with no rubs or gallops.  No thyromegaly or JVD noted.   Marland Kitchen Respiratory: Clear to auscultation with no wheezes or rales. Good inspiratory effort. . Abdomen: Soft nontender nondistended with normal bowel sounds x4 quadrants. . Musculoskeletal: Bilateral lower extremity edema. Right foot is wrapped with dressing. Marland Kitchen Psychiatry: Mood is appropriate for condition and setting  Discharge Instructions You were cared for by a hospitalist during your hospital stay. If you have any questions about your discharge medications or the care you received while you were in the hospital after you are discharged, you can call the unit and asked to speak with the hospitalist on call if the hospitalist that took care of you is not available. Once you are discharged, your primary care physician will handle any further medical issues. Please note that NO REFILLS for any discharge medications will be authorized once  you are discharged, as it is imperative that you return to your primary care physician (or establish a relationship with a primary care physician if you do not have one) for your aftercare needs so that they can reassess your need for medications and monitor your lab values.   Allergies as of 07/23/2018       Reactions   No Known Allergies       Medication List    STOP taking these medications   oxyCODONE-acetaminophen 5-325 MG tablet Commonly known as:  PERCOCET     TAKE these medications   aspirin EC 81 MG tablet Take 81 mg by mouth daily.   AURYXIA 1 GM 210 MG(Fe) tablet Generic drug:  ferric citrate Take 630 mg by mouth 3 (three) times daily with meals.   calcitRIOL 0.5 MCG capsule Commonly known as:  ROCALTROL Take 2 capsules (1 mcg total) by mouth 3 (three) times a week. Start taking on:  July 26, 2018   cefTAZidime 2 g in sodium chloride 0.9 % 100 mL Inject 2 g into the vein every Monday, Wednesday, and Friday with hemodialysis. Start taking on:  July 26, 2018   clopidogrel 75 MG tablet Commonly known as:  PLAVIX Take 1 tablet (75 mg total) by mouth daily with breakfast. What changed:  when to take this   colchicine 0.6 MG tablet Take 0.6 mg by mouth daily as needed (for gout flare ups).   gabapentin 100 MG capsule Commonly known as:  NEURONTIN TAKE 1 CAPSULE (100 MG TOTAL) BY MOUTH AT BEDTIME.   lactulose (encephalopathy) 10 GM/15ML Soln Commonly known as:  CHRONULAC Take 20 g by mouth daily as needed (constipation).   lidocaine-prilocaine cream Commonly known as:  EMLA Apply 1 application topically See admin instructions. Apply small amount to access site 1 to 2 hours before dialysis.   midodrine 10 MG tablet Commonly known as:  PROAMATINE Take 10 mg by mouth 3 (three) times a week.   multivitamin Tabs tablet Take 1 tablet by mouth at bedtime.   pantoprazole 40 MG tablet Commonly known as:  PROTONIX Take 1 tablet (40 mg total) by mouth daily at 6 (six) AM. Start taking on:  July 24, 2018   SENSIPAR 30 MG tablet Generic drug:  cinacalcet Take 30 mg by mouth daily.   Vancomycin 750-5 MG/150ML-% Soln Commonly known as:  VANCOCIN Inject 150 mLs (750 mg total) into the vein every Monday, Wednesday, and Friday with hemodialysis. Start  taking on:  July 26, 2018            Durable Medical Equipment  (From admission, onward)         Start     Ordered   07/22/18 4132  For home use only DME Walker rolling  Once    Question:  Patient needs a walker to treat with the following condition  Answer:  Ambulatory dysfunction   07/22/18 0741         Allergies  Allergen Reactions  . No Known Allergies    Follow-up Information    [place], FNP Follow up.   Specialties:  Family Medicine, Infectious Diseases Why:  1/22 at 3 pm. If unable to make this appointment please call to rescheudle.  Contact information: [address] [place] [address] [place] Follow up.   Why:  Best boy information: [address] [place] [address] [phone]  [facility] Follow up.   Specialty:  [place] Why:  HHRN/PT- arranged they will call you to set up home visits (home wound VAC arranged- will be delivered to room and placed on pt prior to discharge) Contact information: [place] [address] [phone]        [name], MD. Call in 1 day(s).   Specialty:  Cardiology Why:  Please call for a post hospital follow-up appointment. Contact information: [address] [place] [address] [phone]        [name], DPM. Call in 1 day(s).   Specialty:  Podiatry Why:  Please call for a post hospital follow-up appointment. Contact information: [address] [place] [address] [phone]            The results of significant diagnostics from this hospitalization (including imaging, microbiology, ancillary and laboratory) are listed below for reference.    Significant Diagnostic Studies: Ir Fluoro Guide Cv Line Left  Result Date: 07/15/2018 INDICATION: Continued poor function of tunneled left IJ hemodialysis  catheter, post revision x 2. EXAM: TUNNELED CENTRAL VENOUS HEMODIALYSIS CATHETER REVISION WITH FLUOROSCOPIC GUIDANCE VENOUS ANGIOPLASTY OF CENTRAL VENOUS STENOSIS MEDICATIONS: patient is already on adequate prophylactic antibiotic coverage ANESTHESIA/SEDATION: Intravenous Fentanyl and Versed were administered as conscious sedation during continuous monitoring of the patient's level of consciousness and physiological / cardiorespiratory status by the radiology RN, with a total moderate sedation time of 18 minutes. FLUOROSCOPY TIME:  3.2 minute; 242  uGym2 DAP COMPLICATIONS: None immediate. PROCEDURE: Informed written consent was obtained from the patient after a discussion of the risks, benefits, and alternatives to treatment. Questions regarding the procedure were encouraged and answered. The left neck, catheter, and chest were prepped with chlorhexidine in a sterile fashion, and a sterile drape was applied covering the operative field. Maximum barrier sterile technique with sterile gowns and gloves were used for the procedure. A timeout was performed prior to the initiation of the procedure. The subcutaneous tract of the left IJ tunneled catheter was infiltrated with 1% lidocaine. The catheter was partially withdrawn and central venography was performed through the blue venous return lumen of the catheter. 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 Some eccentric fibrin sheath/thrombus at the site of the prior catheter tip at the cavoatrial junction. Distal SVC is patent with good flow into the right atrium, minimal reflux into the IVC. Catheter was exchanged over a Bentson wire for a 5 [place] Kumpe catheter, which allowed access to the IVC. Catheter exchanged for a 12 mm x 4 cm atlas angioplasty balloon, advanced into distal left innominate vein and proximal SVC for overlapping venous angioplasty at 18 atmospheres. The balloon  catheter was then advanced into the IVC and exchanged over a stiff Amplatz Glidewire for a new 28 cm Palindrome tunneled hemodialysis catheter. This was advanced to the mid right atrium. Both lumens aspirated and flushed easily. Good catheter position confirmed on chest rate fluoroscopy, documentation stored. The catheter was secured externally with 0 Prolene suture in flushed per protocol. Dressings were applied. The patient tolerated the procedure well without immediate post procedural complication. IMPRESSION: 1. 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 2. The lesions responded well  to 12 mm  balloon angioplasty. 3. Technically successful exchange and revision of tunneled left IJ hemodialysis catheter, a 28 cm Palindrome HD catheter advanced to the right atrium. Electronically Signed   By: Lucrezia Europe M.D.   On: 07/15/2018 17:22   Dg Foot 2 Views Right  Result Date: 07/17/2018 CLINICAL DATA:  Fifth toe resection. EXAM: RIGHT FOOT - 2 VIEW COMPARISON:  Right foot x-rays dated July 15, 2018. FINDINGS: Interval resection of the fifth toe. Unchanged partial resection of the fifth metatarsal. Unchanged comminuted intra-articular fracture of the first proximal phalanx head. 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 Bone mineralization is normal. Postsurgical changes in the soft tissues along the lateral forefoot with wound VAC in place. IMPRESSION: 1. Interval amputation of the fifth toe.  No acute abnormality. 2. Unchanged comminuted fracture of the first proximal phalanx. Electronically Signed   By: Titus Dubin M.D.   On: 07/17/2018 10:59   Dg Foot 2 Views Right  Result Date: 07/15/2018 CLINICAL DATA:  Postop irrigation and debridement. Fifth metatarsal resection. History of diabetes. EXAM: RIGHT FOOT - 2 VIEW  COMPARISON:  Preop study from 07/13/2018 FINDINGS: The patient has undergone amputation of the distal half of the right fifth metatarsal with postsurgical change involving the soft tissues of the lateral forefoot. Vacuum device projects over the lateral aspect of the forefoot at site of amputation. Redemonstration of comminuted intra-articular fracture involving the head of the first proximal phalanx. Osteoarthritis of the toes otherwise noted. No additional new findings. IMPRESSION: 1. 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 2. Intra-articular fracture of the first proximal phalangeal head as before with comminution. No significant change. Electronically Signed   By: Ashley Royalty M.D.   On: 07/15/2018 19:32   Dg Foot Complete Left  Result Date: 07/13/2018 Please see detailed radiograph report in office note.  Dg Foot Complete Right  Result Date: 07/13/2018 CLINICAL DATA:  Right foot pain after blister popped on right foot above little toe 3 days ago. History of diabetes. EXAM: RIGHT FOOT COMPLETE - 3+ VIEW COMPARISON:  Earlier same day radiographs of the foot. FINDINGS: Soft tissue ulceration is seen along the lateral aspect of the forefoot adjacent to the head of the fifth metatarsal. No radiographic evidence of osteomyelitis. No cortical bone loss deep to the ulceration is identified. Comminuted intra-articular fracture of the head of the first proximal phalanx extending into the interphalangeal joint is noted. Mild degenerative joint space narrowing of the DIP and PIP joints of the second through fifth digits. Extra-articular erosive change along the medial aspect of the first metatarsal head. Midfoot articulations are maintained with tiny accessory ossicle adjacent to the tarsal navicular. Vascular calcifications crossing the ankle joint are identified compatible with history of diabetes. Small calcaneal enthesophytes are noted. Soft tissue  calcifications along the course of the Achilles tendon is identified possibly related to old trauma. IMPRESSION: 1. Soft tissue ulceration along the lateral aspect of the forefoot adjacent to the fifth metatarsal head. No cortical bone loss to suggest radiographic evidence of osteomyelitis. 2. Comminuted intra-articular fracture of the first proximal phalangeal head extending into the interphalangeal joint. No significant callus formation is identified suggesting a recent fracture. 3. Ancillary findings as above. Electronically Signed   By: Ashley Royalty M.D.   On: 07/13/2018 13:37   Dg Foot Complete Right  Result Date: 07/13/2018 Please see detailed radiograph report in office note.  Nm Bone Scan 3 Phase Lower Extremity  Result Date: 07/15/2018 CLINICAL DATA:  Soft tissue ulceration adjacent to the fifth metatarsal. Osteomyelitis suggested on radiograph. EXAM: NUCLEAR MEDICINE 3-PHASE BONE SCAN TECHNIQUE: Radionuclide angiographic images, immediate static blood pool images, and 3-hour delayed static images were obtained of the feet after intravenous injection of radiopharmaceutical. RADIOPHARMACEUTICALS:  21.9 mCi Tc-55m MDP IV COMPARISON:  Radiograph 07/13/2018 FINDINGS: Vascular phase: Mild diffuse hyperemia to the RIGHT foot compared to the LEFT. Blood pool phase: Diffuse increased blood pool activity in the RIGHT foot compared to the LEFT. Some focality in the great toe on the RIGHT. Focality also the head of the fifth metacarpal Delayed phase: Focal activity in the proximal phalanx of the first digit and distal fifth metacarpal. IMPRESSION: Focal delayed activity in the proximal phalanx of the first digit and distal aspect of the fifth metatarsal of the RIGHT foot. With comparison to radiograph 07/13/2018, the proximal phalanx first digit uptake is favored posttraumatic. Uptake at the distal aspect of the fifth metatarsal is favored osteomyelitis. Electronically Signed   By: Suzy Bouchard M.D.   On:  07/15/2018 12:44   Ir Pta Venous Except Dialysis Circuit  Result Date: 07/15/2018 INDICATION: Continued poor function of tunneled left IJ hemodialysis catheter, post revision x 2. EXAM: TUNNELED CENTRAL VENOUS HEMODIALYSIS CATHETER REVISION WITH FLUOROSCOPIC GUIDANCE VENOUS ANGIOPLASTY OF CENTRAL VENOUS STENOSIS MEDICATIONS: patient is already on adequate prophylactic antibiotic coverage ANESTHESIA/SEDATION: Intravenous Fentanyl and Versed were administered as conscious sedation during continuous monitoring of the patient's level of consciousness and physiological / cardiorespiratory status by the radiology RN, with a total moderate sedation time of 18 minutes. FLUOROSCOPY TIME:  3.2 minute; 643  uGym2 DAP COMPLICATIONS: None immediate. PROCEDURE: Informed written consent was obtained from the patient after a discussion of the risks, benefits, and alternatives to treatment. Questions regarding the procedure were encouraged and answered. The left neck, catheter, and chest were prepped with chlorhexidine in a sterile fashion, and a sterile drape was applied covering the operative field. Maximum barrier sterile technique with sterile gowns and gloves were used for the procedure. A timeout was performed prior to the initiation of the procedure. The subcutaneous tract of the left IJ tunneled catheter was infiltrated with 1% lidocaine. The catheter was partially withdrawn and central venography was performed through the blue venous return lumen of the catheter. 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 Some eccentric fibrin sheath/thrombus at the site of the prior catheter tip at the cavoatrial junction. Distal SVC is patent with good flow into the right atrium, minimal reflux into the IVC. Catheter was exchanged over a Bentson wire for a 5 [place] Kumpe catheter, which allowed access to the IVC. Catheter exchanged for a 12 mm x 4 cm  atlas angioplasty balloon, advanced into distal left innominate vein and proximal SVC for overlapping venous angioplasty at 18 atmospheres. The balloon catheter was then advanced into the IVC and exchanged over a stiff Amplatz Glidewire for a new 28 cm Palindrome tunneled hemodialysis catheter. This was advanced to the mid right atrium. Both lumens aspirated and flushed easily. Good catheter position confirmed on chest rate fluoroscopy, documentation stored. The catheter was secured externally with 0 Prolene suture in flushed per protocol. Dressings were applied. The patient tolerated the procedure well without immediate post procedural complication. IMPRESSION: 1. 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 2. The lesions responded well  to 12 mm balloon angioplasty. 3. Technically successful exchange and revision of tunneled left IJ hemodialysis catheter, a 28 cm Palindrome HD catheter advanced to the right atrium. Electronically Signed   By: Lucrezia Europe M.D.   On: 07/15/2018 17:22   Vas [place] Burnard Bunting With/wo Tbi  Result Date: 07/14/2018 LOWER EXTREMITY DOPPLER STUDY Indications: Ulceration. High Risk Factors: Diabetes. Other Factors: Renal Dialysis Pt with lt fistula.  Performing Technologist: Birdena Crandall, [place] [facility]  Examination Guidelines: A complete evaluation includes at minimum, Doppler waveform signals and systolic blood pressure reading at the level of bilateral brachial, anterior tibial, and posterior tibial arteries, when vessel segments are accessible. Bilateral testing is considered an integral part of a complete examination. Photoelectric Plethysmograph (PPG) waveforms and toe systolic pressure readings are included as required and additional duplex testing as needed. Limited examinations for reoccurring indications may be performed  as noted.  ABI Findings: +---------+------------------+-----+-------------------+--------+ Right    Rt Pressure (mmHg)IndexWaveform           Comment  +---------+------------------+-----+-------------------+--------+ Brachial 157                    triphasic                   +---------+------------------+-----+-------------------+--------+ PTA      97                0.62 dampened monophasic         +---------+------------------+-----+-------------------+--------+ DP       94                0.60 dampened monophasic         +---------+------------------+-----+-------------------+--------+ Great Toe25                0.16                             +---------+------------------+-----+-------------------+--------+ +---------+------------------+-----+----------+----------------------------+ Left     Lt Pressure (mmHg)IndexWaveform  Comment                      +---------+------------------+-----+----------+----------------------------+ Brachial                                  Resyructed limb wuth fistula +---------+------------------+-----+----------+----------------------------+ PTA      79                0.50 monophasic                             +---------+------------------+-----+----------+----------------------------+ DP       91                0.58 monophasic                             +---------+------------------+-----+----------+----------------------------+ Great Toe255               1.62           Non compressible             +---------+------------------+-----+----------+----------------------------+ +-------+-----------+---------------+------------+------------+ ABI/TBIToday's ABIToday's TBI    Previous ABIPrevious TBI +-------+-----------+---------------+------------+------------+ Right  0.62       0.16                                    +-------+-----------+---------------+------------+------------+  Left   0.58        Noncompressible                         +-------+-----------+---------------+------------+------------+  Summary: Right: Resting right ankle-brachial index indicates moderate right lower extremity arterial disease. The right toe-brachial index is abnormal. Left: Resting left ankle-brachial index indicates moderate left lower extremity arterial disease. TBIs are unreliable.  *See table(s) above for measurements and observations.  Electronically signed by Quay Burow MD on 07/14/2018 at 4:46:20 PM.    Final    Vas US Duplex Dialysis Access (avf, Avg)  Result Date: 07/18/2018 DIALYSIS ACCESS Reason for Exam: Non-maturation of AVF. Access Site: Left Upper Extremity. Access Type: Radial-cephalic AVF. Performing Technologist: Landry Mellow RDMS, RVT  Examination Guidelines: A complete evaluation includes B-mode imaging, spectral Doppler, color Doppler, and power Doppler as needed of all accessible portions of each vessel. Unilateral testing is considered an integral part of a complete examination. Limited examinations for reoccurring indications may be performed as noted.  Findings: +--------------------+----------+-----------------+--------+ AVF                 PSV (cm/s)Flow Vol (mL/min)Comments +--------------------+----------+-----------------+--------+ Native artery inflow   120           427                +--------------------+----------+-----------------+--------+ AVF Anastomosis        400           845                +--------------------+----------+-----------------+--------+  +------------+----------+-------------+----------+--------+ OUTFLOW VEINPSV (cm/s)Diameter (cm)Depth (cm)Describe +------------+----------+-------------+----------+--------+ Prox UA        134                                    +------------+----------+-------------+----------+--------+ Mid UA          92                    0.47            +------------+----------+-------------+----------+--------+  Dist UA        350        0.32        0.51            +------------+----------+-------------+----------+--------+ AC Fossa       400        0.51        0.95            +------------+----------+-------------+----------+--------+   Summary: Patent arteriovenous fistula. *See table(s) above for measurements and observations.  Diagnosing physician: Harold Barban MD Electronically signed by Harold Barban MD on 07/18/2018 at 8:23:05 AM.   --------------------------------------------------------------------------------   Final    Vas [place] Upper Ext Vein Mapping (pre-op Avf)  Result Date: 07/22/2018 UPPER EXTREMITY VEIN MAPPING  Indications: Pre-access. Performing Technologist: Abram Sander RVS  Examination Guidelines: A complete evaluation includes B-mode imaging, spectral Doppler, color Doppler, and power Doppler as needed of all accessible portions of each vessel. Bilateral testing is considered an integral part of a complete examination. Limited examinations for reoccurring indications may be performed as noted. 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  *See  table(s) above for measurements and observations.  Diagnosing physician: Monica Martinez MD Electronically signed by Monica Martinez MD on 07/22/2018 at 3:07:22 PM.    Final     Microbiology: Recent Results (from the past 240 hour(s))  Blood culture (routine x 2)     Status: None   Collection Time: 07/13/18 12:52 PM  Result Value Ref Range Status   Specimen Description BLOOD RIGHT HAND  Final   Special Requests   Final    BOTTLES DRAWN AEROBIC ONLY Blood Culture results may not be optimal due to an inadequate volume of blood received in culture bottles   Culture   Final    NO GROWTH 5 DAYS Performed at [facility], [place] [address] [place], [place] [address]    Report Status 07/18/2018 FINAL   Final  Blood culture (routine x 2)     Status: None   Collection Time: 07/13/18 12:57 PM  Result Value Ref Range Status   Specimen Description BLOOD BLOOD RIGHT FOREARM  Final   Special Requests   Final    BOTTLES DRAWN AEROBIC AND ANAEROBIC Blood Culture results may not be optimal due to an inadequate volume of blood received in culture bottles   Culture   Final    NO GROWTH 5 DAYS Performed at [facility], [place] [address], [place], [place] [address]    Report Status 07/18/2018 FINAL  Final  WOUND CULTURE     Status: Abnormal   Collection Time: 07/13/18  1:24 PM  Result Value Ref Range Status   MICRO NUMBER: [id number]  Final   SPECIMEN QUALITY: Adequate  Final   SOURCE: NOT GIVEN  Final   STATUS: FINAL  Final   GRAM STAIN:   Final    No white blood cells seen No epithelial cells seen Many Gram positive cocci in pairs Many Gram negative bacilli   ISOLATE 1: Enterobacter cloacae complex (A)  Final    Comment: Heavy growth of Enterobacter cloacae complex   ISOLATE 2: Klebsiella oxytoca (A)  Final    Comment: Heavy growth of Klebsiella oxytoca      Susceptibility   Klebsiella oxytoca - AEROBIC CULT, GRAM STAIN NEGATIVE 2    AMOX/CLAVULANIC <=2 Sensitive     AMPICILLIN  Resistant     AMPICILLIN/SULBACTAM <=2 Sensitive     CEFAZOLIN <=4 Not Reportable     CEFEPIME <=1 Sensitive     CEFTRIAXONE <=1 Sensitive     CIPROFLOXACIN <=0.25 Sensitive     LEVOFLOXACIN <=0.12 Sensitive     ERTAPENEM <=0.5 Sensitive     GENTAMICIN <=1 Sensitive     IMIPENEM <=0.25 Sensitive     PIP/TAZO <=4 Sensitive     TOBRAMYCIN <=1 Sensitive     TRIMETH/SULFA* <=20 Sensitive      * For infections other than uncomplicated UTIcaused by E. coli, K. pneumoniae or P. mirabilis:Cefazolin is resistant if MIC > or = 8 mcg/mL.(Distinguishing susceptible versus intermediatefor isolates with MIC < or = 4 mcg/mL requiresadditional testing.)Legend:S = Susceptible  I = IntermediateR = Resistant  NS = Not  susceptible* = Not tested  NR = Not reported**NN = See antimicrobic comments   Enterobacter cloacae complex - AEROBIC CULT, GRAM STAIN NEGATIVE 1    AMOX/CLAVULANIC >=32 Resistant     CEFAZOLIN* >=64 Resistant      * For infections other than uncomplicated UTIcaused by E. coli, K. pneumoniae or P. mirabilis:Cefazolin is resistant if MIC > or = 8 mcg/mL.(Distinguishing susceptible versus  intermediatefor isolates with MIC < or = 4 mcg/mL requiresadditional testing.)    CEFEPIME <=1 Sensitive     CEFTRIAXONE <=1 Sensitive     CIPROFLOXACIN <=0.25 Sensitive     LEVOFLOXACIN <=0.12 Sensitive     ERTAPENEM <=0.5 Sensitive     GENTAMICIN <=1 Sensitive     IMIPENEM <=0.25 Sensitive     PIP/TAZO <=4 Sensitive     TOBRAMYCIN <=1 Sensitive     TRIMETH/SULFA <=20 Sensitive   Surgical pcr screen     Status: None   Collection Time: 07/13/18  9:11 PM  Result Value Ref Range Status   MRSA, PCR NEGATIVE NEGATIVE Final   Staphylococcus aureus NEGATIVE NEGATIVE Final    Comment: (NOTE) The Xpert SA Assay (FDA approved for NASAL specimens in patients 60 years of age and older), is one component of a comprehensive surveillance program. It is not intended to diagnose infection nor to guide or monitor treatment. Performed at [facility], [place] [address] [place], [place] [address]   Aerobic/Anaerobic Culture (surgical/deep wound)     Status: None   Collection Time: 07/15/18  6:47 PM  Result Value Ref Range Status   Specimen Description BONE TISSUE  Final   Special Requests METATARSAL BONE FROM RIGHT FOOT  Final   Gram Stain   Final    MODERATE WBC PRESENT,BOTH PMN AND MONONUCLEAR NO ORGANISMS SEEN    Culture   Final    RARE ENTEROBACTER CLOACAE RARE ENTEROCOCCUS FAECALIS NO ANAEROBES ISOLATED Performed at [facility], [place] [address], [place], [place] [address]    Report Status 07/20/2018 FINAL  Final   Organism ID, Bacteria ENTEROBACTER CLOACAE  Final   Organism ID, Bacteria  ENTEROCOCCUS FAECALIS  Final      Susceptibility   Enterobacter cloacae - MIC*    CEFAZOLIN >=64 RESISTANT Resistant     CEFEPIME <=1 SENSITIVE Sensitive     CEFTAZIDIME <=1 SENSITIVE Sensitive     CEFTRIAXONE <=1 SENSITIVE Sensitive     CIPROFLOXACIN <=0.25 SENSITIVE Sensitive     GENTAMICIN <=1 SENSITIVE Sensitive     IMIPENEM 0.5 SENSITIVE Sensitive     TRIMETH/SULFA <=20 SENSITIVE Sensitive     PIP/TAZO <=4 SENSITIVE Sensitive     * RARE ENTEROBACTER CLOACAE   Enterococcus faecalis - MIC*    AMPICILLIN <=2 SENSITIVE Sensitive     VANCOMYCIN 1 SENSITIVE Sensitive     GENTAMICIN SYNERGY SENSITIVE Sensitive     * RARE ENTEROCOCCUS FAECALIS  Aerobic/Anaerobic Culture (surgical/deep wound)     Status: None   Collection Time: 07/15/18  6:50 PM  Result Value Ref Range Status   Specimen Description TISSUE  Final   Special Requests SOFT TISSUE RIGHT FOOT  Final   Gram Stain   Final    FEW WBC PRESENT,BOTH PMN AND MONONUCLEAR RARE GRAM NEGATIVE RODS    Culture   Final    FEW ENTEROBACTER CLOACAE FEW ENTEROCOCCUS FAECALIS FEW KLEBSIELLA OXYTOCA NO ANAEROBES ISOLATED Performed at [facility], [address] [place], [place] [address]    Report Status 07/20/2018 FINAL  Final   Organism ID, Bacteria ENTEROBACTER CLOACAE  Final   Organism ID, Bacteria ENTEROCOCCUS FAECALIS  Final   Organism ID, Bacteria KLEBSIELLA OXYTOCA  Final      Susceptibility   Enterobacter cloacae - MIC*    CEFAZOLIN >=64 RESISTANT Resistant     CEFEPIME <=1 SENSITIVE Sensitive     CEFTAZIDIME <=1 SENSITIVE Sensitive     CEFTRIAXONE <=  1 SENSITIVE Sensitive     CIPROFLOXACIN <=0.25 SENSITIVE Sensitive     GENTAMICIN <=1 SENSITIVE Sensitive     IMIPENEM <=0.25 SENSITIVE Sensitive     TRIMETH/SULFA <=20 SENSITIVE Sensitive     PIP/TAZO <=4 SENSITIVE Sensitive     * FEW ENTEROBACTER CLOACAE   Enterococcus faecalis - MIC*    AMPICILLIN <=2 SENSITIVE Sensitive     VANCOMYCIN 1 SENSITIVE Sensitive      GENTAMICIN SYNERGY SENSITIVE Sensitive     * FEW ENTEROCOCCUS FAECALIS   Klebsiella oxytoca - MIC*    AMPICILLIN >=32 RESISTANT Resistant     CEFAZOLIN 16 SENSITIVE Sensitive     CEFEPIME <=1 SENSITIVE Sensitive     CEFTAZIDIME <=1 SENSITIVE Sensitive     CEFTRIAXONE <=1 SENSITIVE Sensitive     CIPROFLOXACIN <=0.25 SENSITIVE Sensitive     GENTAMICIN <=1 SENSITIVE Sensitive     IMIPENEM <=0.25 SENSITIVE Sensitive     TRIMETH/SULFA <=20 SENSITIVE Sensitive     AMPICILLIN/SULBACTAM 4 SENSITIVE Sensitive     PIP/TAZO <=4 SENSITIVE Sensitive     Extended ESBL NEGATIVE Sensitive     * FEW KLEBSIELLA OXYTOCA     Labs: Basic Metabolic Panel: Recent Labs  Lab 07/16/18 1911 07/19/18 0745 07/21/18 0755 07/23/18 0643  NA 136 137 134* 132*  K 4.7 4.3 4.0 3.8  CL 97* 98 95* 93*  CO2 21* 22 23 23   GLUCOSE 144* 114* 78 104*  BUN 60* 48* 37* 33*  CREATININE 11.74* 11.25* 9.50* 9.60*  CALCIUM 8.3* 8.7* 8.4* 8.5*  PHOS 7.2* 6.2* 4.8*  --    Liver Function Tests: Recent Labs  Lab 07/16/18 1911 07/19/18 0745 07/21/18 0755  ALBUMIN 2.6* 2.9* 2.7*   No results for input(s): LIPASE, AMYLASE in the last 168 hours. No results for input(s): AMMONIA in the last 168 hours. CBC: Recent Labs  Lab 07/16/18 1911 07/19/18 0745 07/21/18 0755 07/23/18 0643  WBC 9.8 9.5 10.0 9.3  HGB 11.8* 11.2* 10.6* 10.1*  HCT 38.8* 36.0* 35.5* 33.5*  MCV 106.0* 106.8* 106.6* 106.7*  PLT 224 209 274 284   Cardiac Enzymes: No results for input(s): CKTOTAL, CKMB, CKMBINDEX, TROPONINI in the last 168 hours. BNP: BNP (last 3 results) No results for input(s): BNP in the last 8760 hours.  ProBNP (last 3 results) No results for input(s): PROBNP in the last 8760 hours.  CBG: Recent Labs  Lab 07/22/18 0654 07/22/18 1108 07/22/18 1602 07/22/18 2119 07/23/18 0635  GLUCAP 84 122* 114* 129* 72       Signed:  Kayleen Memos, MD [facility] 07/23/2018, 12:36 PM

## 2018-07-23 NOTE — H&P (View-Only) (Signed)
  Progress Note    07/23/2018 10:04 AM   Subjective:  States right foot feels much better since retrograde peroneal intervention with angioplasty.  IN dialysis this am.    Vitals:   07/23/18 0900 07/23/18 0930  BP: 106/73 100/70  Pulse: 96 98  Resp:    Temp:    SpO2:     Physical Exam: Incisions:  L groin cath site soft, without hematoma or bleeding; R peroneal cath site without bleeding Extremities:  Peroneal and PT signal by doppler in right foot   CBC    Component Value Date/Time   WBC 9.3 07/23/2018 0643   RBC 3.14 (L) 07/23/2018 0643   HGB 10.1 (L) 07/23/2018 0643   HGB 15.2 05/11/2018 1219   HCT 33.5 (L) 07/23/2018 0643   PLT 284 07/23/2018 0643   PLT 245 05/11/2018 1219   MCV 106.7 (H) 07/23/2018 0643   MCH 32.2 07/23/2018 0643   MCHC 30.1 07/23/2018 0643   RDW 16.7 (H) 07/23/2018 0643   LYMPHSABS 0.8 07/13/2018 1252   MONOABS 0.6 07/13/2018 1252   EOSABS 0.1 07/13/2018 1252   BASOSABS 0.0 07/13/2018 1252    BMET    Component Value Date/Time   NA 132 (L) 07/23/2018 0643   K 3.8 07/23/2018 0643   CL 93 (L) 07/23/2018 0643   CO2 23 07/23/2018 0643   GLUCOSE 104 (H) 07/23/2018 0643   BUN 33 (H) 07/23/2018 0643   CREATININE 9.60 (H) 07/23/2018 0643   CREATININE 9.49 (HH) 05/11/2018 1219   CALCIUM 8.5 (L) 07/23/2018 0643   GFRNONAA 5 (L) 07/23/2018 0643   GFRNONAA 5 (L) 05/11/2018 1219   GFRAA 6 (L) 07/23/2018 0643   GFRAA 6 (L) 05/11/2018 1219    INR    Component Value Date/Time   INR 1.24 03/12/2013 0553     Intake/Output Summary (Last 24 hours) at 07/23/2018 1004 Last data filed at 07/23/2018 0000 Gross per 24 hour  Intake 801.41 ml  Output -  Net 801.41 ml     Assessment/Plan:  72 y.o. male is s/p L arm fistulogram and angioplasty of popliteal, tp trunk, and peroneal artery with stenting of tp trunk and peroneal.  Will need Plavix at discharge for right leg intervention with peroneal/TP trunk stents. Right upper extremity venogram  this afternoon by Dr. Scot Dock to evaluate for future access.      [name], MD [facility] Office: [phone] Pager: [place]

## 2018-07-23 NOTE — Progress Notes (Signed)
Patient discharged.  Vital signs stable, peripheral IV removed.  AVS reviewed with patient and daughter who both acknowledged understanding.  All questions addressed.  Pt transported to [facility] by [place], [place].

## 2018-07-23 NOTE — Discharge Instructions (Signed)
Food Basics for Chronic Kidney Disease When your kidneys are not working well, they cannot remove waste and excess substances from your blood as effectively as they did before. This can lead to a buildup and imbalance of these substances, which can worsen kidney damage and affect how your body functions. Certain foods lead to a buildup of these substances in the body. By changing your diet as recommended by your diet and nutrition specialist (dietitian) or health care provider, you could help prevent further kidney damage and delay or prevent the need for dialysis. What are tips for following this plan? General instructions  Work with your health care provider and dietitian to develop a meal plan that is right for you. Foods you can eat, limit, or avoid will be different for each person depending on the stage of kidney disease and any other existing health conditions.  Talk with your health care provider about whether you should take a vitamin and mineral supplement.  Use standard measuring cups and spoons to measure servings of foods. Use a kitchen scale to measure portions of protein foods.  If directed by your health care provider, avoid drinking too much fluid. Measure and count all liquids, including water, ice, soups, flavored gelatin, and frozen desserts such as popsicles or ice cream. Reading food labels  Check the amount of sodium in foods. Choose foods that have less than 300 milligrams (mg) per serving.  Check the ingredient list for phosphorus or potassium-based additives or preservatives.  Check the amount of saturated and trans fat. Limit or avoid these fats as told by your dietitian. Shopping  Avoid buying foods that are: ? Processed, frozen, or prepackaged. ? Calcium-enriched or fortified.  Do not buy foods that have salt or sodium listed among the first five ingredients.  Do not buy canned vegetables. Cooking  Replace animal proteins, such as meat, fish, eggs, or dairy,  with plant proteins from beans, nuts, and soy. ? Use soy milk instead of cow's milk. ? Add beans or tofu to soups, casseroles, or pasta dishes instead of meat.  Soak vegetables, such as potatoes, before cooking to reduce potassium. To do this: ? Peel and cut into small pieces. ? Soak in warm water for at least 2 hours. For every 1 cup of vegetables, use 10 cups of water. ? Drain and rinse with warm water. ? Boil for at least 5 minutes. Meal planning  Limit the amount of protein from plant and animal sources you eat each day.  Do not add salt to food when cooking or before eating.  Eat meals and snacks at around the same time each day. If you have diabetes:  If you have diabetes (diabetes mellitus) and chronic kidney disease, it is important to keep your blood glucose in the target range recommended by your health care provider. Follow your diabetes management plan. This may include: ? Checking your blood glucose regularly. ? Taking oral medicines, insulin, or both. ? Exercising for at least 30 minutes on 5 or more days each week, or as told by your health care provider. ? Tracking how many servings of carbohydrates you eat at each meal.  You may be given specific guidelines on how much of certain foods and nutrients you may eat, depending on your stage of kidney disease and whether you have high blood pressure (hypertension). Follow your meal plan as told by your dietitian. What nutrients should be limited? The items listed are not a complete list. Talk with your dietitian about  what dietary choices are best for you. Potassium Potassium affects how steadily your heart beats. If too much potassium builds up in your blood, it can cause an irregular heartbeat or even a heart attack. You may need to eat less potassium, depending on your blood potassium levels and the stage of kidney disease. Talk to your dietitian about how much potassium you may have each day. You may need to limit or  avoid foods that are high in potassium, such as:  Milk and soy milk.  Fruits, such as bananas, papaya, apricots, nectarines, melon, prunes, raisins, kiwi, and oranges.  Vegetables, such as potatoes, sweet potatoes, yams, tomatoes, leafy greens, beets, okra, avocado, pumpkin, and winter squash.  White and lima beans.  Phosphorus Phosphorus is a mineral found in your bones. A balance between calcium and phosphorous is needed to build and maintain healthy bones. Too much phosphorus pulls calcium from your bones. This can make your bones weak and more likely to break. Too much phosphorus can also make your skin itch. You may need to eat less phosphorus depending on your blood phosphorus levels and the stage of kidney disease. Talk to your dietitian about how much potassium you may have each day. You may need to take medicine to lower your blood phosphorus levels if diet changes do not help. You may need to limit or avoid foods that are high in phosphorus, such as:  Milk and dairy products.  Dried beans and peas.  Tofu, soy milk, and other soy-based meat replacements.  Colas.  Nuts and peanut butter.  Meat, poultry, and fish.  Bran cereals and oatmeals.  Protein Protein helps you to make and keep muscle. It also helps in the repair of your bodys cells and tissues. One of the natural breakdown products of protein is a waste product called urea. When your kidneys are not working properly, they cannot remove wastes, such as urea, like they did before you developed chronic kidney disease. Reducing how much protein you eat can help prevent a buildup of urea in your blood. Depending on your stage of kidney disease, you may need to limit foods that are high in protein. Sources of animal protein include:  Meat (all types).  Fish and seafood.  Poultry.  Eggs.  Dairy.  Other protein foods include:  Beans and legumes.  Nuts and nut butter.  Soy and tofu.  Sodium Sodium, which is  found in salt, helps maintain a healthy balance of fluids in your body. Too much sodium can increase your blood pressure and have a negative effect on the function of your heart and lungs. Too much sodium can also cause your body to retain too much fluid, making your kidneys work harder. Most people should have less than 2,300 milligrams (mg) of sodium each day. If you have hypertension, you may need to limit your sodium to 1,500 mg each day. Talk to your dietitian about how much sodium you may have each day. You may need to limit or avoid foods that are high in sodium, such as:  Salt seasonings.  Soy sauce.  Cured and processed meats.  Salted crackers and snack foods.  Fast food.  Canned soups and most canned foods.  Pickled foods.  Vegetable juice.  Boxed mixes or ready-to-eat boxed meals and side dishes.  Bottled dressings, sauces, and marinades.  Summary  Chronic kidney disease can lead to a buildup and imbalance of waste and excess substances in the body. Certain foods lead to a buildup of  these substances. By adjusting your intake of these foods, you could help prevent more kidney damage and delay or prevent the need for dialysis.  Food adjustments are different for each person with chronic kidney disease. Work with a dietitian to set up nutrient goals and a meal plan that is right for you.  If you have diabetes and chronic kidney disease, it is important to keep your blood glucose in the target range recommended by your health care provider. This information is not intended to replace advice given to you by your health care provider. Make sure you discuss any questions you have with your health care provider. Document Released: 10/18/2002 Document Revised: 07/23/2016 Document Reviewed: 07/23/2016 Elsevier Interactive Patient Education  2018 [place] Kidney Disease End-stage kidney disease occurs when the kidneys are so damaged that they cannot do their  job. The kidneys are two organs that do many important jobs in the body, which include:  Removing wastes and extra fluids from the blood.  Making hormones that maintain the amount of fluid in your tissues and blood vessels.  Maintaining the right amount of fluids and chemicals in the body.  When the kidneys are damaged and cannot do their job, life-threatening problems occur. Without the help of the kidneys, toxins build up in the blood. In end-stage kidney disease, the kidneys cannot get better. What are the causes? End-stage kidney disease usually occurs when a long-lasting (chronic) kidney disease gets worse. It may also occur after the kidneys are suddenly damaged (acute kidney injury). What increases the risk? This condition is more likely to develop in people who are:  Older than age 35.  Male.  Of African-American descent.  Current smokers or former smokers.  Obese.  You may also have an increased risk for end-stage kidney disease if you:  Have a family history of chronic kidney disease (CKD).  Have had kidney disease for many years.  Have other longstanding medical conditions that affect the kidneys, such as: ? Cardiovascular disease including high blood pressure. ? Diabetes. ? Certain diseases that affect the immune system.  What are the signs or symptoms?  Swelling (edema) of the face, legs, ankles, or feet.  Numbness, tingling, or loss of feeling (sensation) in your hands or feet.  Tiredness (lethargy).  Nausea or vomiting.  Confusion, trouble concentrating, or loss of consciousness.  Chest pain.  Shortness of breath.  Little to no urine production.  Muscle twitches and cramps, especially in the legs.  Constant itchiness.  Loss of appetite.  Pale skin and tissue lining your eyelids (conjunctiva).  Headaches.  Abnormally dark or light skin.  Decrease in muscle size (muscle wasting).  Easy bruising.  Frequent hiccups.  Stopping of  menstruation in women.  Seizures. How is this diagnosed? Your health care provider will measure your blood pressure and do some tests. These may include:  Urine tests.  Blood tests.  Imaging tests.  A test in which a sample of tissue is removed from the kidneys to be looked at under a microscope (kidney biopsy).  How is this treated? There are two treatments for end-stage kidney disease:  A procedure that removes toxic wastes from the body (dialysis). Depending on the type of dialysis you choose, it may be performed more than one time a day (peritoneal dialysis) or several times a week (hemodialysis).  Surgery toreceive a new kidney (kidney transplant).  In addition to having dialysis or a kidney transplant, you may need to take medicines:  To control high blood pressure (hypertension).  To control cholesterol.  To maintain healthy electrolyte levels in your blood.  You may also be given a specific diet to follow that includes requirements or limits for:  Salt (sodium).  Protein.  Phosphorous.  Potassium.  Calcium.  Follow these instructions at home:  Follow your prescribed diet.  Take over-the-counter and prescription medicines only as told by your health care provider. ? Do not take any new medicines unless approved by your health care provider. Many medicines can worsen your kidney damage. ? Do not take any vitamin and mineral supplements unless approved by your health care provider. Many nutritional supplements can worsen your kidney damage. ? The dose of some medicines that you take may need to be adjusted.  Do not use any tobacco products, such as cigarettes, chewing tobacco, and e-cigarettes. If you need help quitting, ask your health care provider.  Keep all follow-up visits as told by your health care provider. This is important.  Keep track of your blood pressure. Report changes in your blood pressure as told by your health care provider.  Achieve  and maintain a healthy weight. If you need help with this, ask your health care provider.  Start or continue an exercise plan. Try to exercise at least 30 minutes a day, 5 days a week.  Stay current with immunizations as told by your health care provider. Where to find more information:  American Association of Kidney Patients: BombTimer.gl  National Kidney Foundation: www.kidney.[place]: https://mathis.com/  [facility]: www.lifeoptions.org and www.kidneyschool.org Contact a health care provider if:  Your symptoms get worse.  You develop new symptoms. Get help right away if:  You have weakness in an arm or leg on one side of your body.  You have difficulty speaking or you are slurring your speech.  You have a sudden change in your vision.  You have a sudden, severe headache.  You have a sudden weight increase.  You have difficulty breathing.  Your symptoms suddenly get worse. This information is not intended to replace advice given to you by your health care provider. Make sure you discuss any questions you have with your health care provider. Document Released: 10/18/2003 Document Revised: 01/03/2016 Document Reviewed: 03/26/2012 Elsevier Interactive Patient Education  2017 Elsevier Inc.   Cellulitis, Adult Cellulitis is a skin infection. The infected area is usually red and sore. This condition occurs most often in the arms and lower legs. It is very important to get treated for this condition. Follow these instructions at home:  Take over-the-counter and prescription medicines only as told by your doctor.  If you were prescribed an antibiotic medicine, take it as told by your doctor. Do not stop taking the antibiotic even if you start to feel better.  Drink enough fluid to keep your pee (urine) clear or pale yellow.  Do not touch or rub the infected area.  Raise (elevate) the infected area above the level of your heart while  you are sitting or lying down.  Place warm or cold wet cloths (warm or cold compresses) on the infected area. Do this as told by your doctor.  Keep all follow-up visits as told by your doctor. This is important. These visits let your doctor make sure your infection is not getting worse. Contact a doctor if:  You have a fever.  Your symptoms do not get better after 1-2 days of treatment.  Your bone or joint under the infected  area starts to hurt after the skin has healed.  Your infection comes back. This can happen in the same area or another area.  You have a swollen bump in the infected area.  You have new symptoms.  You feel ill and also have muscle aches and pains. Get help right away if:  Your symptoms get worse.  You feel very sleepy.  You throw up (vomit) or have watery poop (diarrhea) for a long time.  There are red streaks coming from the infected area.  Your red area gets larger.  Your red area turns darker. This information is not intended to replace advice given to you by your health care provider. Make sure you discuss any questions you have with your health care provider. Document Released: 01/14/2008 Document Revised: 01/03/2016 Document Reviewed: 06/06/2015 Elsevier Interactive Patient Education  2018 [employer].

## 2018-07-23 NOTE — Op Note (Signed)
   PATIENT: Timothy Faulkner      MRN: [id number] DOB: 22-Apr-1946    DATE OF PROCEDURE: 07/23/2018  INDICATIONS:    Timothy Faulkner is a 72 y.o. male who is being evaluated for new access in the right arm.  We were asked to perform a venogram to rule out a central venous occlusion.  PROCEDURE:    Central venogram right upper extremity  SURGEON: Judeth Cornfield. Scot Dock, MD, FACS  ANESTHESIA: None  EBL: Minimal  TECHNIQUE: The patient was brought to the peripheral vascular lab.  An IV had been present in the right upper extremity on the dorsum of the right hand.  Contrast was injected through the IV with the arm elevated.  This demonstrated patency of the brachial veins and axillary vein which emptied into the superior vena cava.  I did not see extensive collaterals or any evidence of obstruction in the central veins.  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  No evidence of central venous occlusion.  CLINICAL NOTE: The patient will be considered for access in the right arm once the wound on the foot has healed.  He can be discharged from our standpoint and arrangements will be made for access in the future.  Deitra Mayo, MD, FACS [facility]  DATE OF DICTATION:   07/23/2018

## 2018-07-24 LAB — HEPATITIS C ANTIBODY: HCV Ab: <0.1 s/co ratio (ref 0.0–0.9)

## 2018-07-26 ENCOUNTER — Encounter (HOSPITAL_COMMUNITY): Payer: Self-pay | Admitting: Vascular Surgery

## 2018-07-26 ENCOUNTER — Telehealth: Payer: Self-pay | Admitting: Podiatry

## 2018-07-26 ENCOUNTER — Ambulatory Visit (INDEPENDENT_AMBULATORY_CARE_PROVIDER_SITE_OTHER): Payer: Medicare Other

## 2018-07-26 DIAGNOSIS — I472 Ventricular tachycardia, unspecified: Secondary | ICD-10-CM

## 2018-07-26 NOTE — Telephone Encounter (Signed)
I spoke with pt's Dtr, Ms Cira Servant states she got no instructions from the nurses on the 4th floor, was given a cream then told they thought she should have it because it was left over. Ms Cira Servant states pt's dressing came off and she redressed. [facility] is to begin dressing changes tomorrow.

## 2018-07-26 NOTE — Progress Notes (Signed)
Remote ICD transmission.   

## 2018-07-26 NOTE — Telephone Encounter (Signed)
Patient Daughter [name] called to get instructions for pt after surgery care. Looks like pt had surgery with [name] on 12/5, I didn't see a post op in, but I scheduled pt for 2:15 with JQ, because [name] will be in the office.

## 2018-07-27 MED ORDER — COLLAGENASE 250 UNIT/GM EX OINT: 1.0000 "application " | TOPICAL_OINTMENT | Freq: Every day | CUTANEOUS | 5 refills | Status: DC

## 2018-07-27 NOTE — Telephone Encounter (Signed)
Can we call in orders for Santyl WTD to the open area

## 2018-07-27 NOTE — Telephone Encounter (Signed)
Santyl orders sent to [facility].

## 2018-07-27 NOTE — Addendum Note (Signed)
Addended by: Harriett Sine D on: 07/27/2018 02:18 PM   Modules accepted: Orders

## 2018-07-27 NOTE — Telephone Encounter (Signed)
I informed pt's dtr, Ms Cira Servant of Dr. Eleanora Neighbor orders and Annitta Needs would need to be processed through [facility]. Ms Cira Servant states [facility] on [address].

## 2018-07-28 ENCOUNTER — Telehealth: Payer: Self-pay | Admitting: Podiatry

## 2018-07-28 ENCOUNTER — Ambulatory Visit: Payer: Medicare Other | Admitting: Podiatry

## 2018-07-28 NOTE — Telephone Encounter (Signed)
[name] asked for day supply amount and stated the smallest amount for this pt would be 30grams for 30 days. I told Timothy Faulkner that was fine.

## 2018-07-28 NOTE — Telephone Encounter (Signed)
Nurse with [facility] calling to verify wound care orders for pt.

## 2018-07-28 NOTE — Telephone Encounter (Signed)
[facility] received prescription for pt for Santyl. Prescription is missing 'date supplied'. Please give pharmacy a call.

## 2018-07-28 NOTE — Telephone Encounter (Signed)
I informed Wells Guiles, LPN - [facility] [name] ordered Santyl wet to dry dressing daily 07/27/2018 in the 07/26/2018 Telephone Call and the santyl had been ordered.

## 2018-07-29 ENCOUNTER — Ambulatory Visit: Payer: Medicare Other | Admitting: Podiatry

## 2018-07-29 DIAGNOSIS — Z9889 Other specified postprocedural states: Secondary | ICD-10-CM

## 2018-08-05 ENCOUNTER — Encounter: Payer: Self-pay | Admitting: Podiatry

## 2018-08-05 ENCOUNTER — Ambulatory Visit (INDEPENDENT_AMBULATORY_CARE_PROVIDER_SITE_OTHER): Payer: Medicare Other | Admitting: Podiatry

## 2018-08-05 DIAGNOSIS — Z9889 Other specified postprocedural states: Secondary | ICD-10-CM

## 2018-08-13 ENCOUNTER — Ambulatory Visit (INDEPENDENT_AMBULATORY_CARE_PROVIDER_SITE_OTHER): Payer: Medicare Other | Admitting: Podiatry

## 2018-08-13 ENCOUNTER — Telehealth: Payer: Self-pay | Admitting: *Deleted

## 2018-08-13 DIAGNOSIS — M79674 Pain in right toe(s): Principal | ICD-10-CM

## 2018-08-13 DIAGNOSIS — B351 Tinea unguium: Secondary | ICD-10-CM

## 2018-08-13 DIAGNOSIS — E08621 Diabetes mellitus due to underlying condition with foot ulcer: Secondary | ICD-10-CM

## 2018-08-13 DIAGNOSIS — Z9889 Other specified postprocedural states: Secondary | ICD-10-CM

## 2018-08-13 DIAGNOSIS — L97511 Non-pressure chronic ulcer of other part of right foot limited to breakdown of skin: Secondary | ICD-10-CM

## 2018-08-13 DIAGNOSIS — M79675 Pain in left toe(s): Principal | ICD-10-CM

## 2018-08-13 DIAGNOSIS — E1151 Type 2 diabetes mellitus with diabetic peripheral angiopathy without gangrene: Secondary | ICD-10-CM

## 2018-08-13 NOTE — Telephone Encounter (Signed)
-----   Message from [name], DPM sent at 08/13/2018 12:25 PM EST ----- Can we refer to wound Care center? Hold off faxing referral until I complete my note please

## 2018-08-13 NOTE — Telephone Encounter (Signed)
Referral form completed, waiting on clinicals, to fax to [facility].

## 2018-08-19 ENCOUNTER — Encounter: Payer: Self-pay | Admitting: Podiatry

## 2018-08-19 ENCOUNTER — Ambulatory Visit (INDEPENDENT_AMBULATORY_CARE_PROVIDER_SITE_OTHER): Payer: Medicare Other | Admitting: Podiatry

## 2018-08-19 DIAGNOSIS — Z9889 Other specified postprocedural states: Secondary | ICD-10-CM

## 2018-08-19 NOTE — Patient Instructions (Signed)
Pre-Operative Instructions  Congratulations, you have decided to take an important step towards improving your quality of life.  You can be assured that the doctors and staff at [facility] will be with you every step of the way.  Here are some important things you should know:  1. Plan to be at the surgery center/hospital at least 1 (one) hour prior to your scheduled time, unless otherwise directed by the surgical center/hospital staff.  You must have a responsible adult accompany you, remain during the surgery and drive you home.  Make sure you have directions to the surgical center/hospital to ensure you arrive on time. 2. If you are having surgery at [facility] or [facility] hospitals, you will need a copy of your medical history and physical form from your family physician within one month prior to the date of surgery. We will give you a form for your primary physician to complete.  3. We make every effort to accommodate the date you request for surgery.  However, there are times where surgery dates or times have to be moved.  We will contact you as soon as possible if a change in schedule is required.   4. No aspirin/ibuprofen for one week before surgery.  If you are on aspirin, any non-steroidal anti-inflammatory medications (Mobic, Aleve, Ibuprofen) should not be taken seven (7) days prior to your surgery.  You make take Tylenol for pain prior to surgery.  5. Medications - If you are taking daily heart and blood pressure medications, seizure, reflux, allergy, asthma, anxiety, pain or diabetes medications, make sure you notify the surgery center/hospital before the day of surgery so they can tell you which medications you should take or avoid the day of surgery. 6. No food or drink after midnight the night before surgery unless directed otherwise by surgical center/hospital staff. 7. No alcoholic beverages 24-hours prior to surgery.  No smoking 24-hours prior or 24-hours after  surgery. 8. Wear loose pants or shorts. They should be loose enough to fit over bandages, boots, and casts. 9. Don't wear slip-on shoes. Sneakers are preferred. 10. Bring your boot with you to the surgery center/hospital.  Also bring crutches or a walker if your physician has prescribed it for you.  If you do not have this equipment, it will be provided for you after surgery. 11. If you have not been contacted by the surgery center/hospital by the day before your surgery, call to confirm the date and time of your surgery. 12. Leave-time from work may vary depending on the type of surgery you have.  Appropriate arrangements should be made prior to surgery with your employer. 13. Prescriptions will be provided immediately following surgery by your doctor.  Fill these as soon as possible after surgery and take the medication as directed. Pain medications will not be refilled on weekends and must be approved by the doctor. 14. Remove nail polish on the operative foot and avoid getting pedicures prior to surgery. 15. Wash the night before surgery.  The night before surgery wash the foot and leg well with water and the antibacterial soap provided. Be sure to pay special attention to beneath the toenails and in between the toes.  Wash for at least three (3) minutes. Rinse thoroughly with water and dry well with a towel.  Perform this wash unless told not to do so by your physician.  Enclosed: 1 Ice pack (please put in freezer the night before surgery)   1 Hibiclens skin cleaner     Pre-op instructions  If you have any questions regarding the instructions, please do not hesitate to call our office.  [place]: [address], [place], [place] [address] -- [phone]  [place]: [address] [place], [place] [address] -- [phone]  [place]: [address]  [place], [place] [address] -- [phone]  [place]: [address], [address], [place],  [address] -- [phone]  Website:  [email or web address] 

## 2018-08-26 ENCOUNTER — Ambulatory Visit (INDEPENDENT_AMBULATORY_CARE_PROVIDER_SITE_OTHER): Payer: Medicare Other | Admitting: Podiatry

## 2018-08-26 DIAGNOSIS — Z9889 Other specified postprocedural states: Secondary | ICD-10-CM

## 2018-08-26 NOTE — Telephone Encounter (Signed)
[name] states he is going to take pt in for surgical debridement and may refer to Wound Care after. [name] states he would like to pt referred to see vascular Dr. Carlis Abbott as quickly as next week. Urgent referral to [facility] - Dr. 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

## 2018-08-26 NOTE — Addendum Note (Signed)
Addended by: Harriett Sine D on: 08/26/2018 10:54 AM   Modules accepted: Orders

## 2018-08-29 NOTE — Progress Notes (Signed)
Subjective:  Patient ID: Timothy Faulkner, male    DOB: [birth year],  MRN: [id number]  Chief Complaint  Patient presents with  . Routine Post Op      Post Op #3/irrigation and debridement right foot metatarsal rection   DOS: 07/15/18 Procedure:  1) Incision Bone Cortex for Osteomyelitis             2) Bone Biopsy Distal Right 5th Metatarsal             3) Metatarsectomy              4) Bone Biopsy Proximal Right 5th Metatarsal             5) Application of Wound VAC  DOS: 07/17/18 Procedure:  1) Bone Biopsy Left 5th Phalanx             2) Delayed Wound Closure - Complex             3) Filleted Toe Flap  73 y.o. male returns for post-op check. Wound has been about the same denies new issues. Doesn't have f/u for vascular surgery set up.  Review of Systems: Negative except as noted in the HPI. Denies N/V/F/Ch.  Past Medical History:  Diagnosis Date  . Anemia   . Arthritis    Gout  . Automatic implantable cardioverter-defibrillator in situ    [employer]  . CHF   . ESRD on dialysis [facility])    [name] 03/11/2013 (03/11/2013) dialysis M/W/F  . GERD (gastroesophageal reflux disease)   . Gout    "once a year"  . Heart murmur   . HYPERCHOLESTEROLEMIA, MIXED   . HYPERTENSION   . Macular degeneration   . 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 Pacemaker   . Peripheral arterial disease (HCC)    left fifth toe ulcer, healing  . Pneumonia   . Shortness of breath   . 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  aspirin EC 81 MG tablet, Take 81 mg by mouth daily., Disp: , Rfl:  .  AURYXIA 1 GM 210 MG(Fe) tablet, Take 630 mg by mouth 3 (three) times daily with meals. , Disp: , Rfl:  .  calcitRIOL (ROCALTROL) 0.5 MCG capsule, Take 2 capsules (1 mcg total) by mouth 3 (three) times a week., Disp: 60 capsule, Rfl: 0 .  cefTAZidime 2 g in sodium chloride 0.9 % 100 mL, Inject 2 g into the  vein every Monday, Wednesday, and Friday with hemodialysis., Disp: 2 Container, Rfl: 0 .  clopidogrel (PLAVIX) 75 MG tablet, Take 1 tablet (75 mg total) by mouth daily with breakfast., Disp: 30 tablet, Rfl: 0 .  colchicine 0.6 MG tablet, Take 0.6 mg by mouth daily as needed (for gout flare ups). , Disp: , Rfl: 1 .  collagenase (SANTYL) ointment, Apply 1 application topically daily. Apply to right foot wound daily cover with dry dressing., Disp: 15 g, Rfl: 5 .  gabapentin (NEURONTIN) 100 MG capsule, TAKE 1 CAPSULE (100 MG TOTAL) BY MOUTH AT BEDTIME., Disp: 90 capsule, Rfl: 2 .  lactulose, encephalopathy, (CHRONULAC) 10 GM/15ML SOLN, Take 20 g by mouth daily as needed (constipation). , Disp: , Rfl:  .  lidocaine-prilocaine (EMLA) cream, Apply 1 application topically See admin instructions. Apply small amount to access site 1 to 2 hours before dialysis., Disp: , Rfl: 12 .  midodrine (PROAMATINE) 10 MG tablet, Take 10 mg by  mouth 3 (three) times a week. , Disp: , Rfl: 6 .  multivitamin (RENA-VIT) TABS tablet, Take 1 tablet by mouth at bedtime., Disp: 30 tablet, Rfl: 0 .  pantoprazole (PROTONIX) 40 MG tablet, Take 1 tablet (40 mg total) by mouth daily at 6 (six) AM., Disp: 30 tablet, Rfl: 0 .  SENSIPAR 30 MG tablet, Take 30 mg by mouth daily., Disp: , Rfl:  .  Vancomycin (VANCOCIN) 750-5 MG/150ML-% SOLN, Inject 150 mLs (750 mg total) into the vein every Monday, Wednesday, and Friday with hemodialysis., Disp: 4000 mL, Rfl: 0  Social History   Tobacco Use  Smoking Status Former Smoker  . Packs/day: 0.75  . Years: 30.00  . Pack years: 22.50  . Types: Cigarettes  . Last attempt to quit: 11/25/1992  . Years since quitting: 25.7  Smokeless Tobacco Never Used    Allergies  Allergen Reactions  . No Known Allergies    Objective:  There were no vitals filed for this visit. There is no height or weight on file to calculate BMI. Constitutional Well developed. Well nourished.  Vascular Foot warm and  well perfused. Capillary refill normal to all digits.   Neurologic Normal speech. Oriented to person, place, and time. Epicritic sensation to light touch grossly present bilaterally.  Dermatologic  Unstageable eschar with central fibrosis and periwound eschar.  Orthopedic: Tenderness to palpation noted about the surgical site. 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   Radiographs: None. Assessment:   1. Post-operative state    Plan:  Patient was evaluated and treated and all questions answered.  S/p foot surgery right -Wound appears to have stalled. -XR: None -WB Status: WBAT in surgical shoe -Sutures: Intact. -Medications: none refilled. -Foot redressed. -Continue santyl WTD. -Eschar cross-hatched to promote santyl penetration. -Will consider wound care referral  Return in about 1 week (around 08/20/2018) for Wound Care, Right.

## 2018-08-29 NOTE — Progress Notes (Signed)
Subjective:  Patient ID: Timothy Faulkner, male    DOB: [birth year],  MRN: [id number]  Chief Complaint  Patient presents with  . Routine Post Op    irrigation and debridement R; pt stated, "doing alright; no other concerns"   DOS: 07/15/18 Procedure:  1) Incision Bone Cortex for Osteomyelitis             2) Bone Biopsy Distal Right 5th Metatarsal             3) Metatarsectomy              4) Bone Biopsy Proximal Right 5th Metatarsal             5) Application of Wound VAC  DOS: 07/17/18 Procedure:  1) Bone Biopsy Left 5th Phalanx             2) Delayed Wound Closure - Complex             3) Filleted Toe Flap  73 y.o. male returns for post-op check. Doing alright. Has not followed up with vascular.  Review of Systems: Negative except as noted in the HPI. Denies N/V/F/Ch.  Past Medical History:  Diagnosis Date  . Anemia   . Arthritis    Gout  . Automatic implantable cardioverter-defibrillator in situ    [employer]  . CHF   . ESRD on dialysis [facility])    [name] 03/11/2013 (03/11/2013) dialysis M/W/F  . GERD (gastroesophageal reflux disease)   . Gout    "once a year"  . Heart murmur   . HYPERCHOLESTEROLEMIA, MIXED   . HYPERTENSION   . Macular degeneration   . 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 Pacemaker   . Peripheral arterial disease (HCC)    left fifth toe ulcer, healing  . Pneumonia   . Shortness of breath   . 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  aspirin EC 81 MG tablet, Take 81 mg by mouth daily., Disp: , Rfl:  .  AURYXIA 1 GM 210 MG(Fe) tablet, Take 630 mg by mouth 3 (three) times daily with meals. , Disp: , Rfl:  .  calcitRIOL (ROCALTROL) 0.5 MCG capsule, Take 2 capsules (1 mcg total) by mouth 3 (three) times a week., Disp: 60 capsule, Rfl: 0 .  cefTAZidime 2 g in sodium chloride 0.9 % 100 mL, Inject 2 g into the vein every Monday, Wednesday, and  Friday with hemodialysis., Disp: 2 Container, Rfl: 0 .  clopidogrel (PLAVIX) 75 MG tablet, Take 1 tablet (75 mg total) by mouth daily with breakfast., Disp: 30 tablet, Rfl: 0 .  colchicine 0.6 MG tablet, Take 0.6 mg by mouth daily as needed (for gout flare ups). , Disp: , Rfl: 1 .  collagenase (SANTYL) ointment, Apply 1 application topically daily. Apply to right foot wound daily cover with dry dressing., Disp: 15 g, Rfl: 5 .  gabapentin (NEURONTIN) 100 MG capsule, TAKE 1 CAPSULE (100 MG TOTAL) BY MOUTH AT BEDTIME., Disp: 90 capsule, Rfl: 2 .  lactulose, encephalopathy, (CHRONULAC) 10 GM/15ML SOLN, Take 20 g by mouth daily as needed (constipation). , Disp: , Rfl:  .  lidocaine-prilocaine (EMLA) cream, Apply 1 application topically See admin instructions. Apply small amount to access site 1 to 2 hours before dialysis., Disp: , Rfl: 12 .  midodrine (PROAMATINE) 10 MG tablet, Take 10 mg by mouth 3 (three) times a week. , Disp: ,  Rfl: 6 .  multivitamin (RENA-VIT) TABS tablet, Take 1 tablet by mouth at bedtime., Disp: 30 tablet, Rfl: 0 .  pantoprazole (PROTONIX) 40 MG tablet, Take 1 tablet (40 mg total) by mouth daily at 6 (six) AM., Disp: 30 tablet, Rfl: 0 .  SENSIPAR 30 MG tablet, Take 30 mg by mouth daily., Disp: , Rfl:  .  Vancomycin (VANCOCIN) 750-5 MG/150ML-% SOLN, Inject 150 mLs (750 mg total) into the vein every Monday, Wednesday, and Friday with hemodialysis., Disp: 4000 mL, Rfl: 0  Social History   Tobacco Use  Smoking Status Former Smoker  . Packs/day: 0.75  . Years: 30.00  . Pack years: 22.50  . Types: Cigarettes  . Last attempt to quit: 11/25/1992  . Years since quitting: 25.7  Smokeless Tobacco Never Used    Allergies  Allergen Reactions  . No Known Allergies    Objective:  There were no vitals filed for this visit. There is no height or weight on file to calculate BMI. Constitutional Well developed. Well nourished.  Vascular Foot warm and well perfused. Capillary refill  normal to all digits.   Neurologic Normal speech. Oriented to person, place, and time. Epicritic sensation to light touch grossly present bilaterally.  Dermatologic Unstageable eschar with central fibrosis and periwound eschar.  Orthopedic: Tenderness to palpation noted about the surgical site. 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   Radiographs: None. Assessment:   1. Post-operative state    Plan:  Patient was evaluated and treated and all questions answered.  S/p foot surgery right -Wound appears to have stalled. -XR: None -WB Status: WBAT in surgical shoe -Sutures: Intact. -Medications: none refilled. -Foot redressed. -Continue santyl WTD. -Will likely benefit from debridement. Will check vascular status first to see if he needs intervention prior to procedure.  Return for post op.

## 2018-08-29 NOTE — Progress Notes (Signed)
Subjective:  Patient ID: Timothy Faulkner, male    DOB: 12/28/1945,  MRN: [id number]  Chief Complaint  Patient presents with  . Routine Post Op    Post Op #5/irrigation and debridement right foot metatarsal rection   DOS: 07/15/18 Procedure:  1) Incision Bone Cortex for Osteomyelitis             2) Bone Biopsy Distal Right 5th Metatarsal             3) Metatarsectomy              4) Bone Biopsy Proximal Right 5th Metatarsal             5) Application of Wound VAC  DOS: 07/17/18 Procedure:  1) Bone Biopsy Left 5th Phalanx             2) Delayed Wound Closure - Complex             3) Filleted Toe Flap  73 y.o. male returns for post-op check. Doing alright. Has not followed up with vascular.  Review of Systems: Negative except as noted in the HPI. Denies N/V/F/Ch.  Past Medical History:  Diagnosis Date  . Anemia   . Arthritis    Gout  . Automatic implantable cardioverter-defibrillator in situ    [employer]  . CHF   . ESRD on dialysis [facility])    [name] 03/11/2013 (03/11/2013) dialysis M/W/F  . GERD (gastroesophageal reflux disease)   . Gout    "once a year"  . Heart murmur   . HYPERCHOLESTEROLEMIA, MIXED   . HYPERTENSION   . Macular degeneration   . 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 Pacemaker   . Peripheral arterial disease (HCC)    left fifth toe ulcer, healing  . Pneumonia   . Shortness of breath   . 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  aspirin EC 81 MG tablet, Take 81 mg by mouth daily., Disp: , Rfl:  .  AURYXIA 1 GM 210 MG(Fe) tablet, Take 630 mg by mouth 3 (three) times daily with meals. , Disp: , Rfl:  .  calcitRIOL (ROCALTROL) 0.5 MCG capsule, Take 2 capsules (1 mcg total) by mouth 3 (three) times a week., Disp: 60 capsule, Rfl: 0 .  cefTAZidime 2 g in sodium chloride 0.9 % 100 mL, Inject 2 g into the vein every Monday, Wednesday, and Friday with  hemodialysis., Disp: 2 Container, Rfl: 0 .  clopidogrel (PLAVIX) 75 MG tablet, Take 1 tablet (75 mg total) by mouth daily with breakfast., Disp: 30 tablet, Rfl: 0 .  colchicine 0.6 MG tablet, Take 0.6 mg by mouth daily as needed (for gout flare ups). , Disp: , Rfl: 1 .  collagenase (SANTYL) ointment, Apply 1 application topically daily. Apply to right foot wound daily cover with dry dressing., Disp: 15 g, Rfl: 5 .  gabapentin (NEURONTIN) 100 MG capsule, TAKE 1 CAPSULE (100 MG TOTAL) BY MOUTH AT BEDTIME., Disp: 90 capsule, Rfl: 2 .  lactulose, encephalopathy, (CHRONULAC) 10 GM/15ML SOLN, Take 20 g by mouth daily as needed (constipation). , Disp: , Rfl:  .  lidocaine-prilocaine (EMLA) cream, Apply 1 application topically See admin instructions. Apply small amount to access site 1 to 2 hours before dialysis., Disp: , Rfl: 12 .  midodrine (PROAMATINE) 10 MG tablet, Take 10 mg by mouth 3 (three) times a week. , Disp: , Rfl: 6 .  multivitamin (RENA-VIT) TABS tablet, Take 1 tablet by mouth at bedtime., Disp: 30 tablet, Rfl: 0 .  pantoprazole (PROTONIX) 40 MG tablet, Take 1 tablet (40 mg total) by mouth daily at 6 (six) AM., Disp: 30 tablet, Rfl: 0 .  SENSIPAR 30 MG tablet, Take 30 mg by mouth daily., Disp: , Rfl:  .  Vancomycin (VANCOCIN) 750-5 MG/150ML-% SOLN, Inject 150 mLs (750 mg total) into the vein every Monday, Wednesday, and Friday with hemodialysis., Disp: 4000 mL, Rfl: 0  Social History   Tobacco Use  Smoking Status Former Smoker  . Packs/day: 0.75  . Years: 30.00  . Pack years: 22.50  . Types: Cigarettes  . Last attempt to quit: 11/25/1992  . Years since quitting: 25.7  Smokeless Tobacco Never Used    Allergies  Allergen Reactions  . No Known Allergies    Objective:  There were no vitals filed for this visit. There is no height or weight on file to calculate BMI. Constitutional Well developed. Well nourished.  Vascular Foot warm and well perfused. Capillary refill normal to all  digits.   Neurologic Normal speech. Oriented to person, place, and time. Epicritic sensation to light touch grossly present bilaterally.  Dermatologic Unstageable eschar with central fibrosis and periwound eschar.  Less eschar noted more granulation tissue noted.  Orthopedic: Tenderness to palpation noted about the surgical site. 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   Radiographs: None. Assessment:   1. Post-operative state    Plan:  Patient was evaluated and treated and all questions answered.  S/p foot surgery right -Less eschar noted more granulation tissue noted. Wound very slow to heal. -XR: None -WB Status: WBAT in surgical shoe -Sutures: Intact. -Medications: none refilled. -Foot redressed. -Continue santyl WTD. -Will likely benefit from debridement. Will check vascular status first to see if he needs intervention prior to procedure.  No follow-ups on file.

## 2018-08-29 NOTE — Progress Notes (Signed)
Subjective:  Patient ID: Timothy Faulkner, male    DOB: October 07, 1945,  MRN: [id number]  Chief Complaint  Patient presents with  . Routine Post Op    Post Op #1/irrigation and debridement right foot metatarsal rection   DOS: 07/15/18 Procedure:  1) Incision Bone Cortex for Osteomyelitis             2) Bone Biopsy Distal Right 5th Metatarsal             3) Metatarsectomy              4) Bone Biopsy Proximal Right 5th Metatarsal             5) Application of Wound VAC  DOS: 07/17/18 Procedure:  1) Bone Biopsy Left 5th Phalanx             2) Delayed Wound Closure - Complex             3) Filleted Toe Flap  73 y.o. male returns for post-op check. [facility]. Using santyl for the wound.  Review of Systems: Negative except as noted in the HPI. Denies N/V/F/Ch.  Past Medical History:  Diagnosis Date  . Anemia   . Arthritis    Gout  . Automatic implantable cardioverter-defibrillator in situ    [employer]  . CHF   . ESRD on dialysis [facility])    [name] 03/11/2013 (03/11/2013) dialysis M/W/F  . GERD (gastroesophageal reflux disease)   . Gout    "once a year"  . Heart murmur   . HYPERCHOLESTEROLEMIA, MIXED   . HYPERTENSION   . Macular degeneration   . 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 Pacemaker   . Peripheral arterial disease (HCC)    left fifth toe ulcer, healing  . Pneumonia   . Shortness of breath   . 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  aspirin EC 81 MG tablet, Take 81 mg by mouth daily., Disp: , Rfl:  .  AURYXIA 1 GM 210 MG(Fe) tablet, Take 630 mg by mouth 3 (three) times daily with meals. , Disp: , Rfl:  .  calcitRIOL (ROCALTROL) 0.5 MCG capsule, Take 2 capsules (1 mcg total) by mouth 3 (three) times a week., Disp: 60 capsule, Rfl: 0 .  cefTAZidime 2 g in sodium chloride 0.9 % 100 mL, Inject 2 g into the vein every Monday, Wednesday, and Friday  with hemodialysis., Disp: 2 Container, Rfl: 0 .  clopidogrel (PLAVIX) 75 MG tablet, Take 1 tablet (75 mg total) by mouth daily with breakfast., Disp: 30 tablet, Rfl: 0 .  colchicine 0.6 MG tablet, Take 0.6 mg by mouth daily as needed (for gout flare ups). , Disp: , Rfl: 1 .  collagenase (SANTYL) ointment, Apply 1 application topically daily. Apply to right foot wound daily cover with dry dressing., Disp: 15 g, Rfl: 5 .  gabapentin (NEURONTIN) 100 MG capsule, TAKE 1 CAPSULE (100 MG TOTAL) BY MOUTH AT BEDTIME., Disp: 90 capsule, Rfl: 2 .  lactulose, encephalopathy, (CHRONULAC) 10 GM/15ML SOLN, Take 20 g by mouth daily as needed (constipation). , Disp: , Rfl:  .  lidocaine-prilocaine (EMLA) cream, Apply 1 application topically See admin instructions. Apply small amount to access site 1 to 2 hours before dialysis., Disp: , Rfl: 12 .  midodrine (PROAMATINE) 10 MG tablet, Take 10 mg by mouth 3 (three) times a week. , Disp: ,  Rfl: 6 .  multivitamin (RENA-VIT) TABS tablet, Take 1 tablet by mouth at bedtime., Disp: 30 tablet, Rfl: 0 .  pantoprazole (PROTONIX) 40 MG tablet, Take 1 tablet (40 mg total) by mouth daily at 6 (six) AM., Disp: 30 tablet, Rfl: 0 .  SENSIPAR 30 MG tablet, Take 30 mg by mouth daily., Disp: , Rfl:  .  Vancomycin (VANCOCIN) 750-5 MG/150ML-% SOLN, Inject 150 mLs (750 mg total) into the vein every Monday, Wednesday, and Friday with hemodialysis., Disp: 4000 mL, Rfl: 0  Social History   Tobacco Use  Smoking Status Former Smoker  . Packs/day: 0.75  . Years: 30.00  . Pack years: 22.50  . Types: Cigarettes  . Last attempt to quit: 11/25/1992  . Years since quitting: 25.7  Smokeless Tobacco Never Used    Allergies  Allergen Reactions  . No Known Allergies    Objective:  There were no vitals filed for this visit. There is no height or weight on file to calculate BMI. Constitutional Well developed. Well nourished.  Vascular Foot warm and well perfused. Capillary refill normal  to all digits.   Neurologic Normal speech. Oriented to person, place, and time. Epicritic sensation to light touch grossly present bilaterally.  Dermatologic  4x2 open area with central fibrosis and periwound eschar.  Orthopedic: Tenderness to palpation noted about the surgical site.   Radiographs: None. Assessment:   1. Post-operative state    Plan:  Patient was evaluated and treated and all questions answered.  S/p foot surgery right -Progressing as expected post-operatively. -XR: None -WB Status: WBAT in surgical shoe -Sutures: Intact. -Medications: none refilled. -Foot redressed. -Continue santyl WTD.  No follow-ups on file.

## 2018-08-29 NOTE — Progress Notes (Signed)
Subjective:  Patient ID: Timothy Faulkner, male    DOB: [birth year],  MRN: [id number]  Chief Complaint  Patient presents with  . Routine Post Op    irrigation and debridement right foot metatarsal rection; pt Diabetic Type 2; Sugar=did not check today   DOS: 07/15/18 Procedure:  1) Incision Bone Cortex for Osteomyelitis             2) Bone Biopsy Distal Right 5th Metatarsal             3) Metatarsectomy              4) Bone Biopsy Proximal Right 5th Metatarsal             5) Application of Wound VAC  DOS: 07/17/18 Procedure:  1) Bone Biopsy Left 5th Phalanx             2) Delayed Wound Closure - Complex             3) Filleted Toe Flap  73 y.o. male returns for post-op check. Wound has been about the same denies new issues. Doesn't have f/u for vascular surgery set up.  Review of Systems: Negative except as noted in the HPI. Denies N/V/F/Ch.  Past Medical History:  Diagnosis Date  . Anemia   . Arthritis    Gout  . Automatic implantable cardioverter-defibrillator in situ    [employer]  . CHF   . ESRD on dialysis [facility])    [name] 03/11/2013 (03/11/2013) dialysis M/W/F  . GERD (gastroesophageal reflux disease)   . Gout    "once a year"  . Heart murmur   . HYPERCHOLESTEROLEMIA, MIXED   . HYPERTENSION   . Macular degeneration   . 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 Pacemaker   . Peripheral arterial disease (HCC)    left fifth toe ulcer, healing  . Pneumonia   . Shortness of breath   . 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  aspirin EC 81 MG tablet, Take 81 mg by mouth daily., Disp: , Rfl:  .  AURYXIA 1 GM 210 MG(Fe) tablet, Take 630 mg by mouth 3 (three) times daily with meals. , Disp: , Rfl:  .  calcitRIOL (ROCALTROL) 0.5 MCG capsule, Take 2 capsules (1 mcg total) by mouth 3 (three) times a week., Disp: 60 capsule, Rfl: 0 .  cefTAZidime 2 g in sodium chloride  0.9 % 100 mL, Inject 2 g into the vein every Monday, Wednesday, and Friday with hemodialysis., Disp: 2 Container, Rfl: 0 .  clopidogrel (PLAVIX) 75 MG tablet, Take 1 tablet (75 mg total) by mouth daily with breakfast., Disp: 30 tablet, Rfl: 0 .  colchicine 0.6 MG tablet, Take 0.6 mg by mouth daily as needed (for gout flare ups). , Disp: , Rfl: 1 .  collagenase (SANTYL) ointment, Apply 1 application topically daily. Apply to right foot wound daily cover with dry dressing., Disp: 15 g, Rfl: 5 .  gabapentin (NEURONTIN) 100 MG capsule, TAKE 1 CAPSULE (100 MG TOTAL) BY MOUTH AT BEDTIME., Disp: 90 capsule, Rfl: 2 .  lactulose, encephalopathy, (CHRONULAC) 10 GM/15ML SOLN, Take 20 g by mouth daily as needed (constipation). , Disp: , Rfl:  .  lidocaine-prilocaine (EMLA) cream, Apply 1 application topically See admin instructions. Apply small amount to access site 1 to 2 hours before dialysis., Disp: , Rfl: 12 .  midodrine (PROAMATINE) 10 MG tablet,  Take 10 mg by mouth 3 (three) times a week. , Disp: , Rfl: 6 .  multivitamin (RENA-VIT) TABS tablet, Take 1 tablet by mouth at bedtime., Disp: 30 tablet, Rfl: 0 .  pantoprazole (PROTONIX) 40 MG tablet, Take 1 tablet (40 mg total) by mouth daily at 6 (six) AM., Disp: 30 tablet, Rfl: 0 .  SENSIPAR 30 MG tablet, Take 30 mg by mouth daily., Disp: , Rfl:  .  Vancomycin (VANCOCIN) 750-5 MG/150ML-% SOLN, Inject 150 mLs (750 mg total) into the vein every Monday, Wednesday, and Friday with hemodialysis., Disp: 4000 mL, Rfl: 0  Social History   Tobacco Use  Smoking Status Former Smoker  . Packs/day: 0.75  . Years: 30.00  . Pack years: 22.50  . Types: Cigarettes  . Last attempt to quit: 11/25/1992  . Years since quitting: 25.7  Smokeless Tobacco Never Used    Allergies  Allergen Reactions  . No Known Allergies    Objective:  There were no vitals filed for this visit. There is no height or weight on file to calculate BMI. Constitutional Well developed. Well  nourished.  Vascular Foot warm and well perfused. Capillary refill normal to all digits.   Neurologic Normal speech. Oriented to person, place, and time. Epicritic sensation to light touch grossly present bilaterally.  Dermatologic  Unstageable eschar with central fibrosis and periwound eschar.  Orthopedic: Tenderness to palpation noted about the surgical site. 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   Radiographs: None. Assessment:   1. Post-operative state    Plan:  Patient was evaluated and treated and all questions answered.  S/p foot surgery right -Progressing as expected post-operatively. -XR: None -WB Status: WBAT in surgical shoe -Sutures: Intact. -Medications: none refilled. -Foot redressed. -Continue santyl WTD.  Return in about 1 week (around 08/12/2018).

## 2018-08-30 ENCOUNTER — Telehealth: Payer: Self-pay | Admitting: *Deleted

## 2018-08-30 NOTE — Telephone Encounter (Signed)
-----   Message from [name], DPM sent at 08/29/2018 11:52 PM EST ----- Please put in the vascular referral all his notes are done thanks

## 2018-08-30 NOTE — Telephone Encounter (Signed)
Faxed orders to [facility].

## 2018-09-01 ENCOUNTER — Encounter: Payer: Self-pay | Admitting: Family

## 2018-09-01 ENCOUNTER — Ambulatory Visit (INDEPENDENT_AMBULATORY_CARE_PROVIDER_SITE_OTHER): Payer: Medicare Other | Admitting: Family

## 2018-09-01 VITALS — Temp 97.8°F | Wt 160.0 lb

## 2018-09-01 DIAGNOSIS — E11628 Type 2 diabetes mellitus with other skin complications: Secondary | ICD-10-CM

## 2018-09-01 DIAGNOSIS — M86171 Other acute osteomyelitis, right ankle and foot: Secondary | ICD-10-CM

## 2018-09-01 DIAGNOSIS — L089 Local infection of the skin and subcutaneous tissue, unspecified: Secondary | ICD-10-CM | POA: Diagnosis not present

## 2018-09-01 NOTE — Progress Notes (Signed)
Subjective:    Patient ID: Timothy Faulkner, male    DOB: 08-Mar-1946, 73 y.o.   MRN: [id number]  Chief Complaint  Patient presents with  . Osteomyelitis   . Diabetic foot infection    HPI:  Timothy Faulkner is a 73 y.o. male who presents today for office visit following hospitalization.   Timothy Faulkner has a previous medical history of end-stage renal disease on dialysis, diabetes, and peripheral artery disease recently admitted to the hospital with a chief complaint of foot pain and concern for infected foot ulcer starting several weeks prior to presentation with worsening purulent drainage.  X-ray with no radiographic evidence of osteomyelitis and inability to complete MRI secondary to ICD placement.  Bone scan performed favoring osteomyelitis.  Initial wound cultures were positive for Klebsiella oxytoca and Enterobacter cloacae.  Blood cultures were without growth.  On 12/5 podiatry performed I&D of the right foot with metatarsal resection.  Gross necrosis surrounding the metatarsal was encountered.  Surgical cultures positive for Klebsiella oxytoca, Enterococcus faecalis, and Enterobacter cloacae.  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 He was placed on a 6 week course of vancomycin and ceftazidime with hemodialysis. CRP on 07/23/18 as 11.5 with ESR of 104.  All hospital labs, records, and imaging were reviewed in detail.  Timothy Faulkner was seen by Podiatry for follow up on 08/26/18 with very slow wound healing and more granulation tissue and less eschar. Wound care was continued with Santyl. [name] is considering additional debridement and is awaiting referral to Vascular surgery.   Timothy Faulkner completed his antimicrobial therapy of vancomycin and ceftazidime last week which he was receiving with hemodialysis. He had no adverse side effects of the treatment. He has mild pain in his foot and has noted minimal  improvements since leaving the hospital. Denies fevers, chills or sweats. He has a follow up appointment with [name] tomorrow.   Allergies  Allergen Reactions  . No Known Allergies       Outpatient Medications Prior to Visit  Medication Sig Dispense Refill  . aspirin EC 81 MG tablet Take 81 mg by mouth daily.    Lorin Picket 1 GM 210 MG(Fe) tablet Take 630 mg by mouth 3 (three) times daily with meals.     . calcitRIOL (ROCALTROL) 0.5 MCG capsule Take 2 capsules (1 mcg total) by mouth 3 (three) times a week. 60 capsule 0  . clopidogrel (PLAVIX) 75 MG tablet Take 1 tablet (75 mg total) by mouth daily with breakfast. 30 tablet 0  . colchicine 0.6 MG tablet Take 0.6 mg by mouth daily as needed (for gout flare ups).   1  . collagenase (SANTYL) ointment Apply 1 application topically daily. Apply to right foot wound daily cover with dry dressing. 15 g 5  . gabapentin (NEURONTIN) 100 MG capsule TAKE 1 CAPSULE (100 MG TOTAL) BY MOUTH AT BEDTIME. 90 capsule 2  . lactulose, encephalopathy, (CHRONULAC) 10 GM/15ML SOLN Take 20 g by mouth daily as needed (constipation).     Marland Kitchen lidocaine-prilocaine (EMLA) cream Apply 1 application topically See admin instructions. Apply small amount to access site 1 to 2 hours before dialysis.  12  . midodrine (PROAMATINE) 10 MG tablet Take 10 mg by mouth 3 (three) times a week.   6  . multivitamin (RENA-VIT) TABS tablet Take 1 tablet by mouth at bedtime. 30 tablet 0  . pantoprazole (PROTONIX) 40 MG tablet Take 1  tablet (40 mg total) by mouth daily at 6 (six) AM. 30 tablet 0  . SENSIPAR 30 MG tablet Take 30 mg by mouth daily.     No facility-administered medications prior to visit.      Past Medical History:  Diagnosis Date  . Anemia   . Arthritis    Gout  . Automatic implantable cardioverter-defibrillator in situ    [employer]  . CHF   . ESRD on dialysis [facility])    [name] 03/11/2013 (03/11/2013) dialysis M/W/F  . GERD (gastroesophageal reflux disease)   .  Gout    "once a year"  . Heart murmur   . HYPERCHOLESTEROLEMIA, MIXED   . HYPERTENSION   . Macular degeneration   . 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 Pacemaker   . Peripheral arterial disease (HCC)    left fifth toe ulcer, healing  . Pneumonia   . Shortness of breath   . 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 A/V FISTULAGRAM Left 07/20/2018   Procedure: A/V FISTULAGRAM;  Surgeon: [name], MD;  Location: [place] [facility];  Service: Cardiovascular;  Laterality: Left;  . ABDOMINAL AORTOGRAM W/LOWER EXTREMITY Bilateral 07/20/2018   Procedure: ABDOMINAL AORTOGRAM W/LOWER EXTREMITY;  Surgeon: [name], MD;  Location: [place] [facility];  Service: Cardiovascular;  Laterality: Bilateral;  . AV FISTULA PLACEMENT Right 12/13/2012   Procedure: ARTERIOVENOUS (AV) FISTULA CREATION;  Surgeon: [name], MD;  Location: [place];  Service: Vascular;  Laterality: Right;  Ultrasound guided  . AV FISTULA PLACEMENT Left 05/07/2016   Procedure: LEFT RADIOCEPHALIC ARTERIOVENOUS (AV) FISTULA CREATION;  Surgeon: [name], MD;  Location: [place];  Service: Vascular;  Laterality: Left;  . BASCILIC VEIN TRANSPOSITION Right 03/26/2016   Procedure: RIGHT BASILIC VEIN TRANSPOSITION;  Surgeon: [name], MD;  Location: [place];  Service: Vascular;  Laterality: Right;  . CARDIAC CATHETERIZATION    . CARDIAC DEFIBRILLATOR PLACEMENT     Chemical engineer  . EYE SURGERY Bilateral    Cataract  . FISTULOGRAM Left 04/22/2018   Procedure: FISTULOGRAM UPPER EXTREMITY;  Surgeon: [name], MD;  Location: [place];  Service: Vascular;  Laterality: Left;  . I&D EXTREMITY Right 07/15/2018   Procedure: IRRIGATION AND DEBRIDEMENT RIGHT FOOT;  Surgeon: [name], DPM;  Location: [place];  Service: Podiatry;  Laterality: Right;  . INCISION AND DRAINAGE ABSCESS / HEMATOMA OF BURSA / KNEE /  THIGH Left 2012   "knee" (03/11/2013)  . INSERT / REPLACE / REMOVE PACEMAKER    . INSERTION OF DIALYSIS CATHETER Left 04/22/2018   Procedure: INSERTION OF DIALYSIS CATHETER;  Surgeon: [name], MD;  Location: [place];  Service: Vascular;  Laterality: Left;  . IR FLUORO GUIDE CV LINE LEFT  07/15/2018  . IR PTA VENOUS EXCEPT DIALYSIS CIRCUIT  07/15/2018  . LOWER EXTREMITY ANGIOGRAPHY Right 07/21/2018   Procedure: LOWER EXTREMITY ANGIOGRAPHY;  Surgeon: [name], MD;  Location: [place] [facility];  Service: Cardiovascular;  Laterality: Right;  . METATARSAL HEAD EXCISION Right 07/15/2018   Procedure: METATARSAL RESECTION;  Surgeon: [name], DPM;  Location: [place];  Service: Podiatry;  Laterality: Right;  . PERIPHERAL VASCULAR INTERVENTION Right 07/21/2018   Procedure: PERIPHERAL VASCULAR INTERVENTION;  Surgeon: [name], MD;  Location: [place] [facility];  Service: Cardiovascular;  Laterality: Right;  peroneal stents  . REVISON OF ARTERIOVENOUS FISTULA Right 0/25/4270   Procedure: Plication OF Right Arm ARTERIOVENOUS FISTULA;  Surgeon: Elam Dutch, MD;  Location[facility];  Service: Vascular;  Laterality: Right;  . REVISON OF ARTERIOVENOUS FISTULA Left 04/22/2018   Procedure: REVISION OF RADIOCEPHALIC ARTERIOVENOUS FISTULA;  Surgeon: [name], MD;  Location: [place];  Service: Vascular;  Laterality: Left;  . SHUNTOGRAM N/A 05/31/2013   Procedure: Earney Mallet;  Surgeon: [name], MD;  Location: [facility] CATH LAB;  Service: Cardiovascular;  Laterality: N/A;  . UPPER EXTREMITY VENOGRAPHY Right 07/23/2018   Procedure: UPPER EXTREMITY VENOGRAPHY;  Surgeon: [name], MD;  Location: [place] [facility];  Service: Cardiovascular;  Laterality: Right;  . WOUND DEBRIDEMENT Right 07/17/2018   Procedure: Wound Debridement; Closure Filleted toe flap Right Foot;  Surgeon: [name], DPM;  Location: [place];  Service: Podiatry;  Laterality: Right;       Family History  Problem Relation Age of Onset  . Heart disease Father   . CAD Father       Social History   Socioeconomic History  . Marital status: Married    Spouse name: [name]  . Number of children: 2  . Years of education: 77  . Highest education level: Not on file  Occupational History  . Occupation: diasbled  Social Needs  . Financial resource strain: Not on file  . Food insecurity:    Worry: Not on file    Inability: Not on file  . Transportation needs:    Medical: Not on file    Non-medical: Not on file  Tobacco Use  . Smoking status: Former Smoker    Packs/day: 0.75    Years: 30.00    Pack years: 22.50    Types: Cigarettes    Last attempt to quit: 11/25/1992    Years since quitting: 25.7  . Smokeless tobacco: Never Used  Substance and Sexual Activity  . Alcohol use: Not Currently  . Drug use: Not Currently    Types: Marijuana  . Sexual activity: Yes  Lifestyle  . Physical activity:    Days per week: Not on file    Minutes per session: Not on file  . Stress: Not on file  Relationships  . Social connections:    Talks on phone: Not on file    Gets together: Not on file    Attends religious service: Not on file    Active member of club or organization: Not on file    Attends meetings of clubs or organizations: Not on file    Relationship status: Not on file  . Intimate partner violence:    Fear of current or ex partner: Not on file    Emotionally abused: Not on file    Physically abused: Not on file    Forced sexual activity: Not on file  Other Topics Concern  . 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  Respiratory: Negative for cough, chest tightness, shortness of breath and wheezing.   Cardiovascular:  Negative for chest pain.  Skin: Positive for wound.       Objective:    Temp 97.8 F (36.6 C) (Oral)   Wt 160 lb (72.6 kg)  BMI 24.33 kg/m  Nursing note and vital signs reviewed.  Physical Exam Constitutional:      General: He is not in acute distress.    Appearance: He is well-developed. He is ill-appearing.     Comments: Sitting in the chair; pleasant with flat affect  Cardiovascular:     Rate and Rhythm: Normal rate and regular rhythm.     Heart sounds: Normal heart sounds.  Pulmonary:     Effort: Pulmonary effort is normal.     Breath sounds: Normal breath sounds.  Musculoskeletal:     Comments: Right foot wound wrapped in bandage with no shadowing and in post-surgical shoe.   Skin:    General: Skin is warm and dry.  Neurological:     Mental Status: He is alert.         Assessment & Plan:   Problem List Items Addressed This Visit      Endocrine   Diabetic foot infection ([place])     Musculoskeletal and Integument   Osteomyelitis of right foot ([place]) - Primary    Timothy Faulkner is has completed 6 weeks of IV antimicrobial therapy with Vancomycin and Ceftazidime for polymicrobial (Enterococcus faecalis, Klebsiella oxytoca, and Enterobacter cloace) osteomyelitis of the right foot starting from a diabetic foot wound s/p incision and drainage and metatarsal resection. Continues to have slow wound healing with possibility of additional debridement and vascular surgery evaluation pending. Will check inflammatory markers with prescription sent with patient to draw with dialysis. Recommend no further antibiotics at this time and if repeat I&D is performed that cultures again be obtained. Continue would care with podiatry. This certainly is complicated wound healing with multiple co-morbidities. Discussed importance of blood sugar control and proper nutrition. Follow up with ID pending as needed and based on inflammatory markers.           I am having Stephane L. Elden maintain  his aspirin EC, SENSIPAR, AURYXIA, colchicine, lidocaine-prilocaine, midodrine, lactulose (encephalopathy), gabapentin, clopidogrel, multivitamin, pantoprazole, calcitRIOL, and collagenase.   Follow-up: Return if symptoms worsen or fail to improve.    Terri Piedra, MSN, FNP-C Nurse Practitioner [facility] for Infectious Disease [place] [facility] Office phone: [phone] Pager: [place] number: [phone]

## 2018-09-01 NOTE — Patient Instructions (Signed)
Nice to see you.  We will check your inflammatory markers.  Will continue to monitor off antibiotics for now.  Continue follow up with Podiatry and Vascular Surgery.  Follow up with ID as needed pending blood work results and wound care.

## 2018-09-01 NOTE — Assessment & Plan Note (Signed)
Mr. Broyhill is has completed 6 weeks of IV antimicrobial therapy with Vancomycin and Ceftazidime for polymicrobial (Enterococcus faecalis, Klebsiella oxytoca, and Enterobacter cloace) osteomyelitis of the right foot starting from a diabetic foot wound s/p incision and drainage and metatarsal resection. Continues to have slow wound healing with possibility of additional debridement and vascular surgery evaluation pending. Will check inflammatory markers with prescription sent with patient to draw with dialysis. Recommend no further antibiotics at this time and if repeat I&D is performed that cultures again be obtained. Continue would care with podiatry. This certainly is complicated wound healing with multiple co-morbidities. Discussed importance of blood sugar control and proper nutrition. Follow up with ID pending as needed and based on inflammatory markers.

## 2018-09-02 ENCOUNTER — Ambulatory Visit: Payer: Self-pay | Admitting: Podiatry

## 2018-09-02 ENCOUNTER — Other Ambulatory Visit: Payer: Self-pay | Admitting: Podiatry

## 2018-09-02 ENCOUNTER — Ambulatory Visit (INDEPENDENT_AMBULATORY_CARE_PROVIDER_SITE_OTHER): Payer: Medicare Other

## 2018-09-02 ENCOUNTER — Telehealth: Payer: Self-pay | Admitting: *Deleted

## 2018-09-02 ENCOUNTER — Ambulatory Visit (INDEPENDENT_AMBULATORY_CARE_PROVIDER_SITE_OTHER): Payer: Medicare Other | Admitting: Podiatry

## 2018-09-02 DIAGNOSIS — E08621 Diabetes mellitus due to underlying condition with foot ulcer: Secondary | ICD-10-CM

## 2018-09-02 DIAGNOSIS — L97511 Non-pressure chronic ulcer of other part of right foot limited to breakdown of skin: Secondary | ICD-10-CM

## 2018-09-02 DIAGNOSIS — E11621 Type 2 diabetes mellitus with foot ulcer: Secondary | ICD-10-CM

## 2018-09-02 DIAGNOSIS — L97509 Non-pressure chronic ulcer of other part of unspecified foot with unspecified severity: Secondary | ICD-10-CM

## 2018-09-02 DIAGNOSIS — L97512 Non-pressure chronic ulcer of other part of right foot with fat layer exposed: Secondary | ICD-10-CM | POA: Insufficient documentation

## 2018-09-02 NOTE — Patient Instructions (Signed)
Pre-Operative Instructions  Congratulations, you have decided to take an important step towards improving your quality of life.  You can be assured that the doctors and staff at [facility] will be with you every step of the way.  Here are some important things you should know:  1. Plan to be at the surgery center/hospital at least 1 (one) hour prior to your scheduled time, unless otherwise directed by the surgical center/hospital staff.  You must have a responsible adult accompany you, remain during the surgery and drive you home.  Make sure you have directions to the surgical center/hospital to ensure you arrive on time. 2. If you are having surgery at [facility] or [facility] hospitals, you will need a copy of your medical history and physical form from your family physician within one month prior to the date of surgery. We will give you a form for your primary physician to complete.  3. We make every effort to accommodate the date you request for surgery.  However, there are times where surgery dates or times have to be moved.  We will contact you as soon as possible if a change in schedule is required.   4. No aspirin/ibuprofen for one week before surgery.  If you are on aspirin, any non-steroidal anti-inflammatory medications (Mobic, Aleve, Ibuprofen) should not be taken seven (7) days prior to your surgery.  You make take Tylenol for pain prior to surgery.  5. Medications - If you are taking daily heart and blood pressure medications, seizure, reflux, allergy, asthma, anxiety, pain or diabetes medications, make sure you notify the surgery center/hospital before the day of surgery so they can tell you which medications you should take or avoid the day of surgery. 6. No food or drink after midnight the night before surgery unless directed otherwise by surgical center/hospital staff. 7. No alcoholic beverages 24-hours prior to surgery.  No smoking 24-hours prior or 24-hours after  surgery. 8. Wear loose pants or shorts. They should be loose enough to fit over bandages, boots, and casts. 9. Don't wear slip-on shoes. Sneakers are preferred. 10. Bring your boot with you to the surgery center/hospital.  Also bring crutches or a walker if your physician has prescribed it for you.  If you do not have this equipment, it will be provided for you after surgery. 11. If you have not been contacted by the surgery center/hospital by the day before your surgery, call to confirm the date and time of your surgery. 12. Leave-time from work may vary depending on the type of surgery you have.  Appropriate arrangements should be made prior to surgery with your employer. 13. Prescriptions will be provided immediately following surgery by your doctor.  Fill these as soon as possible after surgery and take the medication as directed. Pain medications will not be refilled on weekends and must be approved by the doctor. 14. Remove nail polish on the operative foot and avoid getting pedicures prior to surgery. 15. Wash the night before surgery.  The night before surgery wash the foot and leg well with water and the antibacterial soap provided. Be sure to pay special attention to beneath the toenails and in between the toes.  Wash for at least three (3) minutes. Rinse thoroughly with water and dry well with a towel.  Perform this wash unless told not to do so by your physician.  Enclosed: 1 Ice pack (please put in freezer the night before surgery)   1 Hibiclens skin cleaner     Pre-op instructions  If you have any questions regarding the instructions, please do not hesitate to call our office.  [place]: [address], [place], [place] [address] -- [phone]  [place]: [address] [place], [place] [address] -- [phone]  [place]: [address]  [place], [place] [address] -- [phone]  [place]: [address], [address], [place],  [address] -- [phone]  Website:  [email or web address] 

## 2018-09-02 NOTE — Telephone Encounter (Signed)
"  I am calling to find out when my father is scheduled for surgery."  He's scheduled for January 28 at [facility].  "What time is it going to be?"  The surgery will start at 7:15 am but he will need to be there a couple of hours prior to that time.  He should get a call from a pre-op nurse.  She can give you the arrival time.  "Okay, thank you so much."

## 2018-09-03 ENCOUNTER — Encounter (HOSPITAL_COMMUNITY): Payer: Self-pay | Admitting: *Deleted

## 2018-09-03 ENCOUNTER — Telehealth: Payer: Self-pay | Admitting: *Deleted

## 2018-09-03 ENCOUNTER — Other Ambulatory Visit: Payer: Self-pay

## 2018-09-03 ENCOUNTER — Other Ambulatory Visit: Payer: Medicare Other

## 2018-09-03 NOTE — H&P (View-Only) (Signed)
Subjective:  Patient ID: Timothy Faulkner, male    DOB: Apr 16, 1946,  MRN: [id number]  Chief Complaint  Patient presents with  . Routine Post Op    Post Op #5/irrigation and debridement right foot metatarsal rection   DOS: 07/15/18 Procedure:  1) Incision Bone Cortex for Osteomyelitis             2) Bone Biopsy Distal Right 5th Metatarsal             3) Metatarsectomy              4) Bone Biopsy Proximal Right 5th Metatarsal             5) Application of Wound VAC  DOS: 07/17/18 Procedure:  1) Bone Biopsy Left 5th Phalanx             2) Delayed Wound Closure - Complex             3) Filleted Toe Flap  73 y.o. male returns for post-op check. Doing alright. Denies new complaints. Daughter continues to use Santyl and is receiving HHC.  Review of Systems: Negative except as noted in the HPI. Denies N/V/F/Ch.  Past Medical History:  Diagnosis Date  . Anemia   . Arthritis    Gout  . Automatic implantable cardioverter-defibrillator in situ    [employer]  . CHF   . ESRD on dialysis [facility])    [name] 03/11/2013 (03/11/2013) dialysis M/W/F  . GERD (gastroesophageal reflux disease)   . Gout    "once a year"  . Heart murmur   . HYPERCHOLESTEROLEMIA, MIXED   . HYPERTENSION   . Macular degeneration   . 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 Pacemaker   . Peripheral arterial disease (HCC)    left fifth toe ulcer, healing  . Pneumonia   . Shortness of breath   . 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  aspirin EC 81 MG tablet, Take 81 mg by mouth daily., Disp: , Rfl:  .  AURYXIA 1 GM 210 MG(Fe) tablet, Take 420 mg by mouth 3 (three) times daily with meals. , Disp: , Rfl:  .  calcitRIOL (ROCALTROL) 0.5 MCG capsule, Take 2 capsules (1 mcg total) by mouth 3 (three) times a week., Disp: 60 capsule, Rfl: 0 .  clopidogrel (PLAVIX) 75 MG tablet, Take 1 tablet (75 mg total) by mouth  daily with breakfast., Disp: 30 tablet, Rfl: 0 .  colchicine 0.6 MG tablet, Take 0.6 mg by mouth daily as needed (for gout flare ups). , Disp: , Rfl: 1 .  collagenase (SANTYL) ointment, Apply 1 application topically daily. Apply to right foot wound daily cover with dry dressing., Disp: 15 g, Rfl: 5 .  gabapentin (NEURONTIN) 100 MG capsule, TAKE 1 CAPSULE (100 MG TOTAL) BY MOUTH AT BEDTIME. (Patient not taking: Reported on 09/02/2018), Disp: 90 capsule, Rfl: 2 .  lactulose, encephalopathy, (CHRONULAC) 10 GM/15ML SOLN, Take 20 g by mouth daily as needed (for constipation). , Disp: , Rfl:  .  lidocaine-prilocaine (EMLA) cream, Apply 1 application topically See admin instructions. Apply small amount to access site 1 to 2 hours before dialysis., Disp: , Rfl: 12 .  midodrine (PROAMATINE) 10 MG tablet, Take 10 mg by mouth 3 (three) times a week. , Disp: , Rfl: 6 .  multivitamin (RENA-VIT) TABS tablet, Take 1 tablet by mouth at bedtime. (Patient not taking: Reported  on 09/02/2018), Disp: 30 tablet, Rfl: 0 .  pantoprazole (PROTONIX) 40 MG tablet, Take 1 tablet (40 mg total) by mouth daily at 6 (six) AM. (Patient taking differently: Take 40 mg by mouth daily as needed (for acid reflux). ), Disp: 30 tablet, Rfl: 0 .  SENSIPAR 30 MG tablet, Take 30 mg by mouth every other day. , Disp: , Rfl:   Social History   Tobacco Use  Smoking Status Former Smoker  . Packs/day: 0.75  . Years: 30.00  . Pack years: 22.50  . Types: Cigarettes  . Last attempt to quit: 11/25/1992  . Years since quitting: 25.7  Smokeless Tobacco Never Used    No Known Allergies Objective:  There were no vitals filed for this visit. There is no height or weight on file to calculate BMI. Constitutional Well developed. Well nourished.  Vascular Foot warm and well perfused. Capillary refill normal to all digits.   Neurologic Normal speech. Oriented to person, place, and time. Epicritic sensation to light touch grossly present  bilaterally.  Dermatologic Unstageable eschar with central fibrosis and periwound eschar right foot. No warmth. No erythema. Minimal drainage. Wound 4x5 with central granulation tissue surrounding eschar.  Orthopedic: Tenderness to palpation noted about the surgical site. 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   Radiographs: None. Assessment:   1. Diabetic ulcer of other part of right foot associated with diabetes mellitus due to underlying condition, limited to breakdown of skin [facility])    Plan:  Patient was evaluated and treated and all questions answered.  S/p foot surgery right -Wound remains very slow to heal and has progressed little over the past 4 weeks. -XR: None -WB Status: WBAT in surgical shoe -Sutures: Intact. -Medications: none refilled. -Foot redressed. -Continue santyl WTD. -Will plan for wound debridement of the right foot wound. Questionable if the wound has exposed bone underneath the eschar. Will biopsy bone concomitantly if present. -Still awaiting vascular referral.  No follow-ups on file.

## 2018-09-03 NOTE — Progress Notes (Signed)
Subjective:  Patient ID: Timothy Faulkner, male    DOB: 08/16/1945,  MRN: [id number]  Chief Complaint  Patient presents with  . Routine Post Op    Post Op #5/irrigation and debridement right foot metatarsal rection   DOS: 07/15/18 Procedure:  1) Incision Bone Cortex for Osteomyelitis             2) Bone Biopsy Distal Right 5th Metatarsal             3) Metatarsectomy              4) Bone Biopsy Proximal Right 5th Metatarsal             5) Application of Wound VAC  DOS: 07/17/18 Procedure:  1) Bone Biopsy Left 5th Phalanx             2) Delayed Wound Closure - Complex             3) Filleted Toe Flap  73 y.o. male returns for post-op check. Doing alright. Denies new complaints. Daughter continues to use Santyl and is receiving HHC.  Review of Systems: Negative except as noted in the HPI. Denies N/V/F/Ch.  Past Medical History:  Diagnosis Date  . Anemia   . Arthritis    Gout  . Automatic implantable cardioverter-defibrillator in situ    [employer]  . CHF   . ESRD on dialysis [facility])    [name] 03/11/2013 (03/11/2013) dialysis M/W/F  . GERD (gastroesophageal reflux disease)   . Gout    "once a year"  . Heart murmur   . HYPERCHOLESTEROLEMIA, MIXED   . HYPERTENSION   . Macular degeneration   . 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 Pacemaker   . Peripheral arterial disease (HCC)    left fifth toe ulcer, healing  . Pneumonia   . Shortness of breath   . 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  aspirin EC 81 MG tablet, Take 81 mg by mouth daily., Disp: , Rfl:  .  AURYXIA 1 GM 210 MG(Fe) tablet, Take 420 mg by mouth 3 (three) times daily with meals. , Disp: , Rfl:  .  calcitRIOL (ROCALTROL) 0.5 MCG capsule, Take 2 capsules (1 mcg total) by mouth 3 (three) times a week., Disp: 60 capsule, Rfl: 0 .  clopidogrel (PLAVIX) 75 MG tablet, Take 1 tablet (75 mg total) by mouth  daily with breakfast., Disp: 30 tablet, Rfl: 0 .  colchicine 0.6 MG tablet, Take 0.6 mg by mouth daily as needed (for gout flare ups). , Disp: , Rfl: 1 .  collagenase (SANTYL) ointment, Apply 1 application topically daily. Apply to right foot wound daily cover with dry dressing., Disp: 15 g, Rfl: 5 .  gabapentin (NEURONTIN) 100 MG capsule, TAKE 1 CAPSULE (100 MG TOTAL) BY MOUTH AT BEDTIME. (Patient not taking: Reported on 09/02/2018), Disp: 90 capsule, Rfl: 2 .  lactulose, encephalopathy, (CHRONULAC) 10 GM/15ML SOLN, Take 20 g by mouth daily as needed (for constipation). , Disp: , Rfl:  .  lidocaine-prilocaine (EMLA) cream, Apply 1 application topically See admin instructions. Apply small amount to access site 1 to 2 hours before dialysis., Disp: , Rfl: 12 .  midodrine (PROAMATINE) 10 MG tablet, Take 10 mg by mouth 3 (three) times a week. , Disp: , Rfl: 6 .  multivitamin (RENA-VIT) TABS tablet, Take 1 tablet by mouth at bedtime. (Patient not taking: Reported  on 09/02/2018), Disp: 30 tablet, Rfl: 0 .  pantoprazole (PROTONIX) 40 MG tablet, Take 1 tablet (40 mg total) by mouth daily at 6 (six) AM. (Patient taking differently: Take 40 mg by mouth daily as needed (for acid reflux). ), Disp: 30 tablet, Rfl: 0 .  SENSIPAR 30 MG tablet, Take 30 mg by mouth every other day. , Disp: , Rfl:   Social History   Tobacco Use  Smoking Status Former Smoker  . Packs/day: 0.75  . Years: 30.00  . Pack years: 22.50  . Types: Cigarettes  . Last attempt to quit: 11/25/1992  . Years since quitting: 25.7  Smokeless Tobacco Never Used    No Known Allergies Objective:  There were no vitals filed for this visit. There is no height or weight on file to calculate BMI. Constitutional Well developed. Well nourished.  Vascular Foot warm and well perfused. Capillary refill normal to all digits.   Neurologic Normal speech. Oriented to person, place, and time. Epicritic sensation to light touch grossly present  bilaterally.  Dermatologic Unstageable eschar with central fibrosis and periwound eschar right foot. No warmth. No erythema. Minimal drainage. Wound 4x5 with central granulation tissue surrounding eschar.  Orthopedic: Tenderness to palpation noted about the surgical site. 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   Radiographs: None. Assessment:   1. Diabetic ulcer of other part of right foot associated with diabetes mellitus due to underlying condition, limited to breakdown of skin [facility])    Plan:  Patient was evaluated and treated and all questions answered.  S/p foot surgery right -Wound remains very slow to heal and has progressed little over the past 4 weeks. -XR: None -WB Status: WBAT in surgical shoe -Sutures: Intact. -Medications: none refilled. -Foot redressed. -Continue santyl WTD. -Will plan for wound debridement of the right foot wound. Questionable if the wound has exposed bone underneath the eschar. Will biopsy bone concomitantly if present. -Still awaiting vascular referral.  No follow-ups on file.

## 2018-09-03 NOTE — Progress Notes (Signed)
Pt SDW-Pre-op call completed by py daughter, [name] [facility]). Daughter denies that pt C/O SOB and chest pain. Daughter stated that pt is under that care of [name], Cardiology and Dr. Lovena Le, Cardiology (EP). Peri-op prescription for ICD initiated; awaiting response. Daughter stated that pt was instructed to continue taking Aspirin but was not instructed on Plavix; spoke with Delydia, Surgical Coordinator, to make MD aware and to follow up with pt. Daughter made aware to have pt stop taking vitamins, fish oil , and herbal medications. Do not take any NSAIDs ie: Ibuprofen, Advil, Naproxen (Aleve), Motrin, BC and Goody Powder. Daughter stated that pt does not take diabetes medication and does not check his blood glucose at home " it is  done at dialysis." Daughter verbalized understanding of all pre-op instructions.

## 2018-09-03 NOTE — Progress Notes (Signed)
Anesthesia Chart Review:  Case:  [id number] Date/Time:  09/07/18 0715   Procedures:      Debridement Right Foot Wound (Right )     Possible 5th Metatarsal Resection (Right )     POSSIBLE BONE BIOPSY (Right )   Anesthesia type:  Monitor Anesthesia Care   Diagnosis:  Diabetic ulcer of other part of right foot associated with diabetes mellitus due to underlying condition, limited to breakdown of skin ([place]) [W25.852, L97.511]   Pre-op diagnosis:  Right Foot Ulcer, possible Osteomyelitis   Location:  [facility] OR ROOM 65 / [place] OR   Surgeon:  [name], DPM      DISCUSSION: Patient is a 73 year old male scheduled for the above procedure. He was admitted 07/2018 for diabetic food infections. He has been treated for osteomyelitis by ID and podiatry and underwent I&D/5th metatarsal bone biopsies in early December and RLE stent 07/21/18 (vascular surgery; Monica Martinez, MD).   History includes former smoker (quit '94), CHF, non-ischemic cardiomyopathy, chronic systolic CHF, AICD (single chamber ICD, [employer] 06/28/10), SOB, HTN, chronic systolic CHF, PAD (right foot wound, s/p I&D 07/2018; s/p right tibioperoneal trunk and peroneal artery stent placement 07/21/18), ESRD (HD MWF), GERD, DMII, anemia.  H&P completed on 08/20/18 by cardiologist [name], MD. ICD perioperative device from was faxed to [facility].   Patient to continue ASA periperatively. PAT phone RN notified Delydia at Dr. Eleanora Neighbor office to have him clarify with patient perioperative Plavix instructions. He is on Plavix for peripheral stent (RLE) in 07/2018.   He will get labs on arrival and further evaluation by his anesthesia team. Definitive anesthesia plan at that time.   PROVIDERS: [name], MD is PCP/primary cardiologist - Cristopher Peru, MD is EP cardiologist. Last visit 06/29/18. ICD device working normally. His Coreg was stopped and asked to increase salt intake due to hypotension.  Curt Bears,  MD is hematologist. Last visit 05/11/18 for evaluation of polycythemia. Thought likely reactive, but he did order labs to check for Jak 2 mutation, but I don't see any results yet (in process).   Ellouise Newer, MD I neurologist. Last visit 05/27/18 for shaking episodes. Symptoms suggestive of orthostatic tremor. 1 hour awake and asleep EEG was normal (06/10/18). Head CT showed atrophy and small vessel disease, but no acute findings (06/10/18).  - Nephrologist is with [facility]    LABS: He will need labs prior to surgery.   IMAGES: 1V CXR 04/22/18: IMPRESSION: 1. Left-sided central venous catheter tip overlies the SVC confluence. No left pneumothorax 2. Mild cardiomegaly   EKG: 06/29/18 ([facility]): SR with first degree AV block. LAD. Inferior infarct, age undetermined. Anterior infarct, age undetermined. T wave abnormality, consider lateral ischemia. (Previously tracings have shoed widened QRS verus LBBB).    CV: Nuclear stress test 07/12/13: IMPRESSION: Dilated left ventricle with global hypokinesis and estimated ejection fraction of 28%. No findings for infarction or ischemia.  TTE 03/13/2013: Study Conclusions - Left ventricle: The cavity size was moderately dilated. Systolic function was severely reduced. The estimated ejection fraction was in the range of 20% to 25%. Wall motion was normal; there were no regional wall motion abnormalities. - Mitral valve: Mild regurgitation. - Left atrium: The atrium was mildly dilated. - Atrial septum: No defect or patent foramen ovale was identified. - Tricuspid valve: Moderate regurgitation. - Pulmonary arteries: Systolic pressure was moderately increased. PA peak pressure: 34mm Hg (S).  Cardiac cath 11/21/04: FINDINGS:   - LV showed LV  was mildly enlarged. There was severe global hypokinesia with an ejection fraction of 15-20%. - Left main was patent. LAD has 30-40% mid focal stenosis and 15-20%  distal stenosis. Diagonal one and two were very small which were patent. Left circumflex was patent. OM-1 was less than 1 mm which was very, very small. OM-2 and OM-3 very small which were patent. RCA has 10-15% proximal stenosis.  - The patient will be referred as an outpatient for ICD placement.   Past Medical History:  Diagnosis Date  . Anemia   . Arthritis    Gout  . Automatic implantable cardioverter-defibrillator in situ    [employer]  . CHF   . ESRD on dialysis [facility])    [name] 03/11/2013 (03/11/2013) dialysis M/W/F  . GERD (gastroesophageal reflux disease)   . Gout    "once a year"  . Heart murmur   . HYPERCHOLESTEROLEMIA, MIXED   . HYPERTENSION   . Macular degeneration   . 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 Pacemaker   . Peripheral arterial disease (HCC)    left fifth toe ulcer, healing  . Pneumonia   . Shortness of breath   . 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 A/V FISTULAGRAM Left 07/20/2018   Procedure: A/V FISTULAGRAM;  Surgeon: [name], MD;  Location: [place] CV LAB;  Service: Cardiovascular;  Laterality: Left;  . ABDOMINAL AORTOGRAM W/LOWER EXTREMITY Bilateral 07/20/2018   Procedure: ABDOMINAL AORTOGRAM W/LOWER EXTREMITY;  Surgeon: [name], MD;  Location: [place] CV LAB;  Service: Cardiovascular;  Laterality: Bilateral;  . AV FISTULA PLACEMENT Right 12/13/2012   Procedure: ARTERIOVENOUS (AV) FISTULA CREATION;  Surgeon: [name], MD;  Location: [place];  Service: Vascular;  Laterality: Right;  Ultrasound guided  . AV FISTULA PLACEMENT Left 05/07/2016   Procedure: LEFT RADIOCEPHALIC ARTERIOVENOUS (AV) FISTULA CREATION;  Surgeon: [name], MD;  Location: [place];  Service: Vascular;  Laterality: Left;  . BASCILIC VEIN TRANSPOSITION Right 03/26/2016   Procedure: RIGHT BASILIC VEIN TRANSPOSITION;  Surgeon: [name], MD;   Location: [place];  Service: Vascular;  Laterality: Right;  . CARDIAC CATHETERIZATION    . CARDIAC DEFIBRILLATOR PLACEMENT     Chemical engineer  . EYE SURGERY Bilateral    Cataract  . FISTULOGRAM Left 04/22/2018   Procedure: FISTULOGRAM UPPER EXTREMITY;  Surgeon: [name], MD;  Location: [place];  Service: Vascular;  Laterality: Left;  . I&D EXTREMITY Right 07/15/2018   Procedure: IRRIGATION AND DEBRIDEMENT RIGHT FOOT;  Surgeon: [name], DPM;  Location: [place];  Service: Podiatry;  Laterality: Right;  . INCISION AND DRAINAGE ABSCESS / HEMATOMA OF BURSA / KNEE / THIGH Left 2012   "knee" (03/11/2013)  . INSERT / REPLACE / REMOVE PACEMAKER    . INSERTION OF DIALYSIS CATHETER Left 04/22/2018   Procedure: INSERTION OF DIALYSIS CATHETER;  Surgeon: [name], MD;  Location: [place];  Service: Vascular;  Laterality: Left;  . IR FLUORO GUIDE CV LINE LEFT  07/15/2018  . IR PTA VENOUS EXCEPT DIALYSIS CIRCUIT  07/15/2018  . LOWER EXTREMITY ANGIOGRAPHY Right 07/21/2018   Procedure: LOWER EXTREMITY ANGIOGRAPHY;  Surgeon: [name], MD;  Location: [place] CV LAB;  Service: Cardiovascular;  Laterality: Right;  . METATARSAL HEAD EXCISION Right 07/15/2018   Procedure: METATARSAL RESECTION;  Surgeon: [name], DPM;  Location:  [place] OR;  Service: Podiatry;  Laterality: Right;  . PERIPHERAL VASCULAR INTERVENTION Right 07/21/2018   Procedure: PERIPHERAL VASCULAR INTERVENTION;  Surgeon: [name], MD;  Location: [place] CV LAB;  Service: Cardiovascular;  Laterality: Right;  peroneal stents  . REVISON OF ARTERIOVENOUS FISTULA Right 9/59/7471   Procedure: Plication OF Right Arm ARTERIOVENOUS FISTULA;  Surgeon: Elam Dutch, MD;  Location: [facility] OR;  Service: Vascular;  Laterality: Right;  . REVISON OF ARTERIOVENOUS FISTULA Left 04/22/2018   Procedure: REVISION OF RADIOCEPHALIC ARTERIOVENOUS FISTULA;  Surgeon: [name], MD;  Location: [place];  Service:  Vascular;  Laterality: Left;  . SHUNTOGRAM N/A 05/31/2013   Procedure: Earney Mallet;  Surgeon: [name], MD;  Location: [facility] CATH LAB;  Service: Cardiovascular;  Laterality: N/A;  . UPPER EXTREMITY VENOGRAPHY Right 07/23/2018   Procedure: UPPER EXTREMITY VENOGRAPHY;  Surgeon: [name], MD;  Location: [place] CV LAB;  Service: Cardiovascular;  Laterality: Right;  . WOUND DEBRIDEMENT Right 07/17/2018   Procedure: Wound Debridement; Closure Filleted toe flap Right Foot;  Surgeon: [name], DPM;  Location: [place];  Service: Podiatry;  Laterality: Right;    MEDICATIONS: No current facility-administered medications for this encounter.    Marland Kitchen aspirin EC 81 MG tablet  . AURYXIA 1 GM 210 MG(Fe) tablet  . calcitRIOL (ROCALTROL) 0.5 MCG capsule  . clopidogrel (PLAVIX) 75 MG tablet  . colchicine 0.6 MG tablet  . collagenase (SANTYL) ointment  . lactulose, encephalopathy, (CHRONULAC) 10 GM/15ML SOLN  . lidocaine-prilocaine (EMLA) cream  . midodrine (PROAMATINE) 10 MG tablet  . pantoprazole (PROTONIX) 40 MG tablet  . SENSIPAR 30 MG tablet  . gabapentin (NEURONTIN) 100 MG capsule  . multivitamin (RENA-VIT) TABS tablet    Myra Gianotti, PA-C Surgical Short Stay/Anesthesiology [facility] Phone [phone] [facility] Phone [phone] 09/03/2018 3:35 PM

## 2018-09-03 NOTE — Telephone Encounter (Signed)
"  Somebody told Mr. Bottger to stop his Aspirin but they didn't tell him to stop the Plavix."  Sometime [name] doesn't have patients to stop their Plavix.  "Someone has to follow up on it."  I'll let [name] know.

## 2018-09-03 NOTE — Anesthesia Preprocedure Evaluation (Addendum)
Anesthesia Evaluation  Patient identified by MRN, date of birth, ID band Patient awake    Reviewed: Allergy & Precautions, NPO status , Patient's Chart, lab work & pertinent test results  Airway Mallampati: II  TM Distance: >3 FB Neck ROM: Full    Dental no notable dental hx.    Pulmonary sleep apnea , former smoker,    Pulmonary exam normal breath sounds clear to auscultation       Cardiovascular hypertension, Pt. on medications + Cardiac Stents and +CHF  Normal cardiovascular exam+ Cardiac Defibrillator  Rhythm:Regular Rate:Normal     Neuro/Psych negative neurological ROS  negative psych ROS   GI/Hepatic Neg liver ROS, GERD  ,  Endo/Other  negative endocrine ROSdiabetes, Type 2  Renal/GU ESRFRenal disease  negative genitourinary   Musculoskeletal  (+) Arthritis , Osteoarthritis,    Abdominal   Peds negative pediatric ROS (+)  Hematology negative hematology ROS (+)   Anesthesia Other Findings   Reproductive/Obstetrics negative OB ROS                                                             Anesthesia Evaluation  Patient identified by MRN, date of birth, ID band Patient awake    Reviewed: Allergy & Precautions, NPO status , Patient's Chart, lab work & pertinent test results  History of Anesthesia Complications Negative for: history of anesthetic complications  Airway Mallampati: IV  TM Distance: >3 FB Neck ROM: Full    Dental  (+) Dental Advisory Given, Edentulous Upper, Chipped   Pulmonary sleep apnea , former smoker,    breath sounds clear to auscultation       Cardiovascular hypertension, + Peripheral Vascular Disease and +CHF  + pacemaker + Cardiac Defibrillator + Valvular Problems/Murmurs  Rhythm:Regular Rate:Normal   '14 TTE - LV cavity size was moderately dilated. EF 20% to 25%. Mild MR. LA mildly dilated. Moderate TR. PASP was moderatelyincreased.  PA peak pressure: 74m Hg   Neuro/Psych negative neurological ROS  negative psych ROS   GI/Hepatic Neg liver ROS, GERD  Controlled,  Endo/Other  diabetes, Type 2  Renal/GU ESRF and DialysisRenal disease     Musculoskeletal  (+) Arthritis ,  Gout    Abdominal   Peds  Hematology negative hematology ROS (+)   Anesthesia Other Findings   Reproductive/Obstetrics                           Anesthesia Physical Anesthesia Plan  ASA: III  Anesthesia Plan: MAC   Post-op Pain Management:    Induction: Intravenous  PONV Risk Score and Plan: 1 and Propofol infusion and Treatment may vary due to age or medical condition  Airway Management Planned: Nasal Cannula and Natural Airway  Additional Equipment: None  Intra-op Plan:   Post-operative Plan:   Informed Consent: I have reviewed the patients History and Physical, chart, labs and discussed the procedure including the risks, benefits and alternatives for the proposed anesthesia with the patient or authorized representative who has indicated his/her understanding and acceptance.     Plan Discussed with: CRNA and Anesthesiologist  Anesthesia Plan Comments:       Anesthesia Quick Evaluation  Anesthesia Physical Anesthesia Plan  ASA: IV  Anesthesia Plan: MAC   Post-op Pain Management:  Induction: Intravenous  PONV Risk Score and Plan: 1 and Ondansetron  Airway Management Planned: Simple Face Mask  Additional Equipment:   Intra-op Plan:   Post-operative Plan:   Informed Consent: I have reviewed the patients History and Physical, chart, labs and discussed the procedure including the risks, benefits and alternatives for the proposed anesthesia with the patient or authorized representative who has indicated his/her understanding and acceptance.     Dental advisory given  Plan Discussed with: CRNA  Anesthesia Plan Comments: (PAT note written 09/03/2018 by Myra Gianotti,  PA-C. )       Anesthesia Quick Evaluation

## 2018-09-04 NOTE — Telephone Encounter (Signed)
He is to continue both

## 2018-09-05 LAB — CUP PACEART REMOTE DEVICE CHECK
Battery Remaining Longevity: 66 mo
Battery Remaining Percentage: 68 %
Brady Statistic RV Percent Paced: 0 %
Date Time Interrogation Session: 20191213230200
HighPow Impedance: 98 Ohm
Implantable Lead Implant Date: 20111118
Implantable Lead Location: 753860
Implantable Lead Model: 181
Implantable Lead Serial Number: 313758
Implantable Pulse Generator Implant Date: 20111118
Lead Channel Impedance Value: 613 Ohm
Lead Channel Pacing Threshold Amplitude: 0.7 V
Lead Channel Pacing Threshold Pulse Width: 0.5 ms
Lead Channel Setting Pacing Amplitude: 2.4 V
Lead Channel Setting Sensing Sensitivity: 0.5 mV
MDC IDC SET LEADCHNL RV PACING PULSEWIDTH: 0.5 ms
Pulse Gen Serial Number: 268731

## 2018-09-07 ENCOUNTER — Ambulatory Visit (HOSPITAL_COMMUNITY): Payer: Medicare Other | Admitting: Physician Assistant

## 2018-09-07 ENCOUNTER — Ambulatory Visit (HOSPITAL_COMMUNITY): Payer: Medicare Other

## 2018-09-07 ENCOUNTER — Ambulatory Visit (HOSPITAL_COMMUNITY)
Admission: RE | Admit: 2018-09-07 | Discharge: 2018-09-07 | Disposition: A | Discharge status: Home / Self Care | Payer: Medicare Other | Attending: Podiatry | Admitting: Podiatry

## 2018-09-07 ENCOUNTER — Telehealth: Payer: Self-pay | Admitting: *Deleted

## 2018-09-07 ENCOUNTER — Encounter (HOSPITAL_COMMUNITY)
Admission: RE | Disposition: A | Discharge status: Home / Self Care | Payer: Self-pay | Source: Home / Self Care | Attending: Podiatry

## 2018-09-07 ENCOUNTER — Encounter (HOSPITAL_COMMUNITY): Payer: Self-pay

## 2018-09-07 DIAGNOSIS — E1151 Type 2 diabetes mellitus with diabetic peripheral angiopathy without gangrene: Secondary | ICD-10-CM | POA: Insufficient documentation

## 2018-09-07 DIAGNOSIS — I428 Other cardiomyopathies: Secondary | ICD-10-CM | POA: Diagnosis not present

## 2018-09-07 DIAGNOSIS — N186 End stage renal disease: Secondary | ICD-10-CM | POA: Insufficient documentation

## 2018-09-07 DIAGNOSIS — Z95810 Presence of automatic (implantable) cardiac defibrillator: Secondary | ICD-10-CM | POA: Insufficient documentation

## 2018-09-07 DIAGNOSIS — M109 Gout, unspecified: Secondary | ICD-10-CM | POA: Diagnosis not present

## 2018-09-07 DIAGNOSIS — Z79899 Other long term (current) drug therapy: Secondary | ICD-10-CM | POA: Insufficient documentation

## 2018-09-07 DIAGNOSIS — Z992 Dependence on renal dialysis: Secondary | ICD-10-CM | POA: Diagnosis not present

## 2018-09-07 DIAGNOSIS — K219 Gastro-esophageal reflux disease without esophagitis: Secondary | ICD-10-CM | POA: Diagnosis not present

## 2018-09-07 DIAGNOSIS — E1122 Type 2 diabetes mellitus with diabetic chronic kidney disease: Secondary | ICD-10-CM | POA: Insufficient documentation

## 2018-09-07 DIAGNOSIS — Z7902 Long term (current) use of antithrombotics/antiplatelets: Secondary | ICD-10-CM | POA: Insufficient documentation

## 2018-09-07 DIAGNOSIS — M86671 Other chronic osteomyelitis, right ankle and foot: Secondary | ICD-10-CM

## 2018-09-07 DIAGNOSIS — L97511 Non-pressure chronic ulcer of other part of right foot limited to breakdown of skin: Secondary | ICD-10-CM | POA: Insufficient documentation

## 2018-09-07 DIAGNOSIS — I5022 Chronic systolic (congestive) heart failure: Secondary | ICD-10-CM | POA: Diagnosis not present

## 2018-09-07 DIAGNOSIS — Z955 Presence of coronary angioplasty implant and graft: Secondary | ICD-10-CM | POA: Diagnosis not present

## 2018-09-07 DIAGNOSIS — Z87891 Personal history of nicotine dependence: Secondary | ICD-10-CM | POA: Diagnosis not present

## 2018-09-07 DIAGNOSIS — L97509 Non-pressure chronic ulcer of other part of unspecified foot with unspecified severity: Secondary | ICD-10-CM

## 2018-09-07 DIAGNOSIS — E11621 Type 2 diabetes mellitus with foot ulcer: Secondary | ICD-10-CM | POA: Diagnosis not present

## 2018-09-07 DIAGNOSIS — E08621 Diabetes mellitus due to underlying condition with foot ulcer: Secondary | ICD-10-CM | POA: Diagnosis not present

## 2018-09-07 DIAGNOSIS — I132 Hypertensive heart and chronic kidney disease with heart failure and with stage 5 chronic kidney disease, or end stage renal disease: Secondary | ICD-10-CM | POA: Insufficient documentation

## 2018-09-07 DIAGNOSIS — L97512 Non-pressure chronic ulcer of other part of right foot with fat layer exposed: Secondary | ICD-10-CM | POA: Insufficient documentation

## 2018-09-07 DIAGNOSIS — L97413 Non-pressure chronic ulcer of right heel and midfoot with necrosis of muscle: Secondary | ICD-10-CM | POA: Diagnosis not present

## 2018-09-07 DIAGNOSIS — Z7982 Long term (current) use of aspirin: Secondary | ICD-10-CM | POA: Diagnosis not present

## 2018-09-07 DIAGNOSIS — G4730 Sleep apnea, unspecified: Secondary | ICD-10-CM | POA: Insufficient documentation

## 2018-09-07 DIAGNOSIS — Z9889 Other specified postprocedural states: Secondary | ICD-10-CM

## 2018-09-07 HISTORY — DX: Presence of spectacles and contact lenses: Z97.3

## 2018-09-07 HISTORY — PX: WOUND DEBRIDEMENT: SHX247

## 2018-09-07 HISTORY — DX: Presence of dental prosthetic device (complete) (partial): Z97.2

## 2018-09-07 HISTORY — DX: Osteomyelitis, unspecified: M86.9

## 2018-09-07 HISTORY — PX: AMPUTATION: SHX166

## 2018-09-07 LAB — BASIC METABOLIC PANEL
Anion gap: 17 — ABNORMAL HIGH (ref 5–15)
BUN: 29 mg/dL — ABNORMAL HIGH (ref 8–23)
CO2: 23 mmol/L (ref 22–32)
Calcium: 8.8 mg/dL — ABNORMAL LOW (ref 8.9–10.3)
Chloride: 96 mmol/L — ABNORMAL LOW (ref 98–111)
Creatinine, Ser: 8.15 mg/dL — ABNORMAL HIGH (ref 0.61–1.24)
GFR calc Af Amer: 7 mL/min — ABNORMAL LOW (ref >60)
GFR, EST NON AFRICAN AMERICAN: 6 mL/min — AB (ref >60)
Glucose, Bld: 70 mg/dL (ref 70–99)
Potassium: 3.6 mmol/L (ref 3.5–5.1)
Sodium: 136 mmol/L (ref 135–145)

## 2018-09-07 LAB — CBC
HCT: 45 % (ref 39.0–52.0)
Hemoglobin: 13.7 g/dL (ref 13.0–17.0)
MCH: 30.2 pg (ref 26.0–34.0)
MCHC: 30.4 g/dL (ref 30.0–36.0)
MCV: 99.1 fL (ref 80.0–100.0)
PLATELETS: 163 10*3/uL (ref 150–400)
RBC: 4.54 MIL/uL (ref 4.22–5.81)
RDW: 15.1 % (ref 11.5–15.5)
WBC: 5.1 10*3/uL (ref 4.0–10.5)
nRBC: 0 % (ref 0.0–0.2)

## 2018-09-07 LAB — GLUCOSE, CAPILLARY
Glucose-Capillary: 56 mg/dL — ABNORMAL LOW (ref 70–99)
Glucose-Capillary: 95 mg/dL (ref 70–99)
Glucose-Capillary: 69 mg/dL — ABNORMAL LOW (ref 70–99)
Glucose-Capillary: 77 mg/dL (ref 70–99)

## 2018-09-07 SURGERY — DEBRIDEMENT, WOUND
Anesthesia: Monitor Anesthesia Care | Site: Foot | Laterality: Right

## 2018-09-07 MED ORDER — FENTANYL CITRATE (PF) 250 MCG/5ML IJ SOLN
INTRAMUSCULAR | Status: AC
  Filled 2018-09-07: qty 5

## 2018-09-07 MED ORDER — 0.9 % SODIUM CHLORIDE (POUR BTL) OPTIME
TOPICAL | Status: DC | PRN
  Administered 2018-09-07: 1000 mL

## 2018-09-07 MED ORDER — DEXTROSE 50 % IV SOLN
INTRAVENOUS | Status: AC
  Administered 2018-09-07: 50 mL via INTRAVENOUS
  Filled 2018-09-07: qty 50

## 2018-09-07 MED ORDER — OXYCODONE HCL 5 MG PO TABS
ORAL_TABLET | ORAL | Status: AC
  Filled 2018-09-07: qty 1

## 2018-09-07 MED ORDER — DEXTROSE 50 % IV SOLN
25.0000 mL | Freq: Once | INTRAVENOUS | Status: AC
  Administered 2018-09-07: 50 mL via INTRAVENOUS
  Filled 2018-09-07: qty 50

## 2018-09-07 MED ORDER — OXYCODONE HCL 5 MG PO TABS: 5.0000 mg | ORAL_TABLET | Freq: Three times a day (TID) | ORAL | 0 refills | Status: DC | PRN

## 2018-09-07 MED ORDER — CLINDAMYCIN HCL 150 MG PO CAPS: 150.0000 mg | ORAL_CAPSULE | Freq: Two times a day (BID) | ORAL | 0 refills | Status: DC

## 2018-09-07 MED ORDER — VANCOMYCIN HCL POWD
Status: DC | PRN
  Administered 2018-09-07: 1000 mg via TOPICAL

## 2018-09-07 MED ORDER — SODIUM CHLORIDE 0.9 % IV SOLN
INTRAVENOUS | Status: DC | PRN
  Administered 2018-09-07: 30 ug/min via INTRAVENOUS

## 2018-09-07 MED ORDER — FENTANYL CITRATE (PF) 250 MCG/5ML IJ SOLN
INTRAMUSCULAR | Status: DC | PRN
  Administered 2018-09-07: 50 ug via INTRAVENOUS

## 2018-09-07 MED ORDER — HYDROMORPHONE HCL 1 MG/ML IJ SOLN
0.2500 mg | INTRAMUSCULAR | Status: DC | PRN
  Administered 2018-09-07 (×2): 0.5 mg via INTRAVENOUS

## 2018-09-07 MED ORDER — OXYCODONE HCL 5 MG/5ML PO SOLN: 5.0000 mg | Freq: Once | ORAL | Status: AC | PRN

## 2018-09-07 MED ORDER — BUPIVACAINE HCL 0.5 % IJ SOLN
INTRAMUSCULAR | Status: DC | PRN
  Administered 2018-09-07: 20 mL

## 2018-09-07 MED ORDER — CEFAZOLIN SODIUM-DEXTROSE 2-3 GM-%(50ML) IV SOLR
INTRAVENOUS | Status: DC | PRN
  Administered 2018-09-07: 2 g via INTRAVENOUS

## 2018-09-07 MED ORDER — VANCOMYCIN HCL 1000 MG IV SOLR
INTRAVENOUS | Status: AC
  Filled 2018-09-07: qty 1000

## 2018-09-07 MED ORDER — PROMETHAZINE HCL 25 MG/ML IJ SOLN: 6.2500 mg | INTRAMUSCULAR | Status: DC | PRN

## 2018-09-07 MED ORDER — CEFAZOLIN SODIUM 1 G IJ SOLR
INTRAMUSCULAR | Status: AC
  Filled 2018-09-07: qty 20

## 2018-09-07 MED ORDER — OXYCODONE HCL 5 MG PO TABS
5.0000 mg | ORAL_TABLET | Freq: Once | ORAL | Status: AC | PRN
  Administered 2018-09-07: 5 mg via ORAL

## 2018-09-07 MED ORDER — SODIUM CHLORIDE 0.9 % IV SOLN
INTRAVENOUS | Status: DC | PRN
  Administered 2018-09-07: 07:00:00 via INTRAVENOUS

## 2018-09-07 MED ORDER — HYDROMORPHONE HCL 1 MG/ML IJ SOLN
INTRAMUSCULAR | Status: AC
  Filled 2018-09-07: qty 1

## 2018-09-07 MED ORDER — PROPOFOL 500 MG/50ML IV EMUL
INTRAVENOUS | Status: DC | PRN
  Administered 2018-09-07: 30 ug/kg/min via INTRAVENOUS

## 2018-09-07 MED ORDER — BUPIVACAINE HCL (PF) 0.5 % IJ SOLN
INTRAMUSCULAR | Status: AC
  Filled 2018-09-07: qty 30

## 2018-09-07 MED FILL — Vancomycin HCl For IV Soln 500 MG (Base Equivalent): INTRAVENOUS | Qty: 500 | Status: AC

## 2018-09-07 SURGICAL SUPPLY — 50 items
AGENT HMST GEL NTOXPRSV FR 28 (Miscellaneous) ×2 IMPLANT
BANDAGE ACE 4X5 VEL STRL LF (GAUZE/BANDAGES/DRESSINGS) ×1 IMPLANT
BANDAGE ELASTIC 4 VELCRO ST LF (GAUZE/BANDAGES/DRESSINGS) ×6 IMPLANT
BLADE LONG MED 31MMX9MM (MISCELLANEOUS) ×1
BLADE LONG MED 31X9 (MISCELLANEOUS) ×2 IMPLANT
BNDG GAUZE ELAST 4 BULKY (GAUZE/BANDAGES/DRESSINGS) ×1 IMPLANT
CONT SPEC 4OZ CLIKSEAL STRL BL (MISCELLANEOUS) ×3 IMPLANT
COVER SURGICAL LIGHT HANDLE (MISCELLANEOUS) ×1 IMPLANT
COVER WAND RF STERILE (DRAPES) ×1 IMPLANT
DRESSING PREVENA PLUS CUSTOM (GAUZE/BANDAGES/DRESSINGS) ×1 IMPLANT
DRSG EMULSION OIL 3X3 NADH (GAUZE/BANDAGES/DRESSINGS) ×4 IMPLANT
DRSG PREVENA PLUS CUSTOM (GAUZE/BANDAGES/DRESSINGS) ×4
ELECT CAUTERY BLADE 6.4 (BLADE) ×1 IMPLANT
ELECT REM PT RETURN 9FT ADLT (ELECTROSURGICAL) ×4
ELECTRODE REM PT RTRN 9FT ADLT (ELECTROSURGICAL) ×2 IMPLANT
GAUZE SPONGE 4X4 12PLY STRL (GAUZE/BANDAGES/DRESSINGS) ×4 IMPLANT
GEL CELLERATE 28G (Miscellaneous) ×3 IMPLANT
GLOVE BIO SURGEON STRL SZ7.5 (GLOVE) ×4 IMPLANT
GLOVE BIOGEL PI IND STRL 7.0 (GLOVE) ×1 IMPLANT
GLOVE BIOGEL PI IND STRL 7.5 (GLOVE) ×1 IMPLANT
GLOVE BIOGEL PI IND STRL 8 (GLOVE) ×2 IMPLANT
GLOVE BIOGEL PI INDICATOR 7.0 (GLOVE) ×2
GLOVE BIOGEL PI INDICATOR 7.5 (GLOVE) ×2
GLOVE BIOGEL PI INDICATOR 8 (GLOVE) ×2
GLOVE SURG SS PI 7.0 STRL IVOR (GLOVE) ×6 IMPLANT
GLOVE SURG SS PI 7.5 STRL IVOR (GLOVE) ×3 IMPLANT
GOWN STRL REUS W/ TWL LRG LVL3 (GOWN DISPOSABLE) ×2 IMPLANT
GOWN STRL REUS W/ TWL XL LVL3 (GOWN DISPOSABLE) ×2 IMPLANT
GOWN STRL REUS W/TWL LRG LVL3 (GOWN DISPOSABLE) ×4
GOWN STRL REUS W/TWL XL LVL3 (GOWN DISPOSABLE) ×4
KIT BASIN OR (CUSTOM PROCEDURE TRAY) ×4 IMPLANT
KIT DRSG PREVENA PLUS 7DAY 125 (MISCELLANEOUS) ×3 IMPLANT
KIT PREVENA INCISION MGT 13 (CANNISTER) ×3 IMPLANT
KIT TURNOVER KIT B (KITS) ×4 IMPLANT
NDL HYPO 25GX1X1/2 BEV (NEEDLE) ×1 IMPLANT
NEEDLE HYPO 25GX1X1/2 BEV (NEEDLE) ×4 IMPLANT
NS IRRIG 1000ML POUR BTL (IV SOLUTION) ×4 IMPLANT
PACK ORTHO EXTREMITY (CUSTOM PROCEDURE TRAY) ×4 IMPLANT
PAD ARMBOARD 7.5X6 YLW CONV (MISCELLANEOUS) ×4 IMPLANT
PAD CAST 4YDX4 CTTN HI CHSV (CAST SUPPLIES) ×1 IMPLANT
PADDING CAST COTTON 4X4 STRL (CAST SUPPLIES) ×4
PROBE DEBRIDE SONICVAC MISONIX (TIP) ×3 IMPLANT
SOL PREP POV-IOD 4OZ 10% (MISCELLANEOUS) ×4 IMPLANT
SUT ETHILON 3 0 PS 1 (SUTURE) ×4 IMPLANT
SYR CONTROL 10ML LL (SYRINGE) ×4 IMPLANT
TOWEL OR 17X26 10 PK STRL BLUE (TOWEL DISPOSABLE) ×4 IMPLANT
TUBE CONNECTING 12'X1/4 (SUCTIONS) ×1
TUBE CONNECTING 12X1/4 (SUCTIONS) ×3 IMPLANT
TUBE IRRIGATION SET MISONIX (TUBING) ×3 IMPLANT
YANKAUER SUCT BULB TIP NO VENT (SUCTIONS) ×4 IMPLANT

## 2018-09-07 NOTE — Progress Notes (Addendum)
Patient's CBG was 56 upon arriving to unit. Called Dr. Ermalene Postin who ordered 25 ml of dextrose IV. Administered at 0630.  Rechecked CBG at 0645 and it was up to 95. Informed Dr. Sabra Heck upon his arrival

## 2018-09-07 NOTE — Interval H&P Note (Signed)
History and Physical Interval Note:  09/07/2018 6:39 AM  Timothy Faulkner  has presented today for surgery, with the diagnosis of Right Foot Ulcer, possible Osteomyelitis  The various methods of treatment have been discussed with the patient and family. After consideration of risks, benefits and other options for treatment, the patient has consented to  Procedure(s): Debridement Right Foot Wound (Right) Possible 5th Metatarsal Resection (Right) POSSIBLE BONE BIOPSY (Right) as a surgical intervention .  The patient's history has been reviewed, patient examined, no change in status, stable for surgery.  I have reviewed the patient's chart and labs.  Questions were answered to the patient's satisfaction.     [name]

## 2018-09-07 NOTE — H&P (Signed)
Anesthesia H&P Update: History and Physical Exam reviewed; patient is OK for planned anesthetic and procedure. ? ?

## 2018-09-07 NOTE — Op Note (Signed)
Patient Name: Timothy Faulkner DOB: 1945/10/23  MRN: [id number]   Date of Service: 09/07/2018  Surgeon: Dr. 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 Item Serial No. Manufacturer Lot No. LRB No. Used  GEL CELLERATE 28G - [id number] Miscellaneous GEL CELLERATE 28G [id number] [employer]  Right 1    Medications: 20 ml 0.5% Marcaine plain Complications: None  Indications for Procedure:  This is a 73 y.o. male with a chronic wound to the right foot s/p partial 5th ray resection, the wound has been cared for in the office and in the home with conservative therapy but is failed to meaningfully progress he presents today for debridement.  There was some concern that the bone has changed and thus discussed the patient that should the bone be exposed to benefit from biopsy or partial resection.  All risks, benefits, and alternatives of surgery were discussed with the patient no guarantees were given.   Procedure in Detail: Patient was identified in pre-operative holding area. Formal consent was signed and the right lower extremity was marked. Patient was brought back to the operating room and remained on the stretcher in the supine position. Anesthesia was induced.   The extremity was prepped and draped in the usual sterile fashion. Timeout was taken  to confirm patient name, laterality, and procedure prior to incision. Attention was then directed to the right foot where a wound measuring 5x3 was encountered.  The wound was sharply excisionally debrided with a 15 blade, followed by a misonix ultrasonic debrider. Debridement was performed to bleeding, viable wound base. The 5th metatarsal bone was noted to be exposed during debridement and the bone was debrided. The wound was debrided to the level of bone.  Following debridement the wound measured 8x4  Attention was then directed to the exposed fifth metatarsal where the soft tissue attachments were freed from the bone.  The bone was transected proximally so that it was no longer within the wound bed.  A small portion of the resected bone was removed with a bone cutting forcep and sent for microbiology.  The remainder the bone and sent for pathology.  The wound was then further debrided and irrigated with the Misonix debrider. Cellerate collagen gel and vancomycin powder were applied topically to the wound. A Praveena wound VAC dressing was applied followed by occlusive dressing. The foot was then dressed with cast padding and ACE bandage. Patient tolerated the procedure well.   Disposition: Following a period of post-operative monitoring, patient will be transferred back home.  We will order a home wound VAC for accelerated granulation tissue formation.  The tissues did show good bleeding during the case showing signs of viability.  We will follow-up the culture and pathology results for possible continued antibiotic therapy.

## 2018-09-07 NOTE — Anesthesia Postprocedure Evaluation (Signed)
Anesthesia Post Note  Patient: Timothy Faulkner  Procedure(s) Performed: Debridement Right Foot Wound, application of wound vac (Right Foot) Right fifth metatarsectomy (Right Foot)     Patient location during evaluation: PACU Anesthesia Type: MAC Level of consciousness: awake and alert Pain management: pain level controlled Vital Signs Assessment: post-procedure vital signs reviewed and stable Respiratory status: spontaneous breathing, nonlabored ventilation and respiratory function stable Cardiovascular status: stable and blood pressure returned to baseline Postop Assessment: no apparent nausea or vomiting Anesthetic complications: no    Last Vitals:  Vitals:   09/07/18 0928 09/07/18 0930  BP:  101/80  Pulse: 73 76  Resp: 10 15  Temp:  (!) 36.4 C  SpO2: 100% 100%    Last Pain:  Vitals:   09/07/18 0634  TempSrc: Oral  PainSc: 0-No pain                 Lynda Rainwater

## 2018-09-07 NOTE — Telephone Encounter (Signed)
Faxed required 2 forms, and 09/07/2018 op notes, with demographics to [facility].

## 2018-09-07 NOTE — Telephone Encounter (Signed)
I asked Dawn - [facility] to have the Wound Vac delivered to [facility] in [place].

## 2018-09-07 NOTE — Telephone Encounter (Signed)
-----   Message from [name], DPM sent at 09/07/2018  8:23 AM EST ----- Can we order wound VAC for this patient? Did not anticipate needing pre-op but would like to have ASAP so we can change on Thursday.  Surgical wound s/p debridement Last debrided today Wound size 8#4. Exposed bone in wound. Medium black sponge  Other details you need let me know and I'll work through them with you  And we'll have to send [facility] orders to change thrice weekly.

## 2018-09-07 NOTE — Telephone Encounter (Signed)
I left a message for Sherlynn Stalls that [name] wants Timothy Faulkner to continue taking the Aspirin as well as the Plavix.

## 2018-09-07 NOTE — Transfer of Care (Signed)
Immediate Anesthesia Transfer of Care Note  Patient: Timothy Faulkner  Procedure(s) Performed: Debridement Right Foot Wound, application of wound vac (Right Foot) Right fifth metatarsectomy (Right Foot)  Patient Location: PACU  Anesthesia Type:MAC  Level of Consciousness: awake, alert , oriented and patient cooperative  Airway & Oxygen Therapy: Patient Spontanous Breathing and Patient connected to nasal cannula oxygen  Post-op Assessment: Report given to RN and Post -op Vital signs reviewed and stable  Post vital signs: Reviewed and stable  Last Vitals:  Vitals Value Taken Time  BP 98/40 09/07/2018  8:17 AM  Temp    Pulse 70 09/07/2018  8:18 AM  Resp 11 09/07/2018  8:18 AM  SpO2 100 % 09/07/2018  8:18 AM  Vitals shown include unvalidated device data.  Last Pain:  Vitals:   09/07/18 0634  TempSrc: Oral  PainSc: 0-No pain      Patients Stated Pain Goal: 3 (03/12/22 3612)  Complications: No apparent anesthesia complications

## 2018-09-07 NOTE — Brief Op Note (Signed)
09/07/2018  8:20 AM  PATIENT:  Timothy Faulkner  73 y.o. male  PRE-OPERATIVE DIAGNOSIS:  Right Foot Ulcer, possible Osteomyelitis  POST-OPERATIVE DIAGNOSIS:  Right Foot Ulcer, possible Osteomyelitis  PROCEDURE:  Procedure(s): Debridement Right Foot Wound, application of wound vac (Right) Right fifth metatarsectomy (Right)  SURGEON:  Surgeon(s) and Role:    * [name], DPM - Primary  PHYSICIAN ASSISTANT:   ASSISTANTS: none   ANESTHESIA:   local and MAC  EBL:  30 mL   BLOOD ADMINISTERED:none  DRAINS: Prevena Wound VAC   LOCAL MEDICATIONS USED:  MARCAINE    and Amount: 20 ml  SPECIMEN:   ID Type Source Tests Collected by Time Destination  1 : fifth metatarsal Tissue Bone SURGICAL PATHOLOGY [name], DPM 09/07/2018 0757   A : right fifth metatarsal Tissue Bone GRAM STAIN, AEROBIC/ANAEROBIC CULTURE (SURGICAL/DEEP WOUND) [name], DPM 09/07/2018 0758     DISPOSITION OF SPECIMEN:  path and micro  Implant Name Type Inv. Item Serial No. Manufacturer Lot No. LRB No. Used  GEL CELLERATE 28G - [id number] Miscellaneous GEL CELLERATE 28G [id number] WOUND [employer]  Right 1    COUNTS:  YES  TOURNIQUET:  * No tourniquets in log *  DICTATION: .Dragon Dictation  PLAN OF CARE: Discharge to home after PACU  PATIENT DISPOSITION:  PACU - hemodynamically stable.   Delay start of Pharmacological VTE agent (>24hrs) due to surgical blood loss or risk of bleeding: not applicable

## 2018-09-08 ENCOUNTER — Encounter (HOSPITAL_COMMUNITY): Payer: Self-pay | Admitting: Podiatry

## 2018-09-08 ENCOUNTER — Telehealth: Payer: Self-pay | Admitting: *Deleted

## 2018-09-08 NOTE — Telephone Encounter (Signed)
Left message for pt to call with post op status and reminded pt of 09/09/2018 8:45am appt.

## 2018-09-08 NOTE — Telephone Encounter (Signed)
Dawn - [facility] states she is picking up the wound vac and it will be here tomorrow at 8:30am.

## 2018-09-09 ENCOUNTER — Ambulatory Visit: Payer: Self-pay

## 2018-09-09 ENCOUNTER — Telehealth: Payer: Self-pay | Admitting: *Deleted

## 2018-09-09 ENCOUNTER — Ambulatory Visit (INDEPENDENT_AMBULATORY_CARE_PROVIDER_SITE_OTHER): Payer: Medicare Other | Admitting: Podiatry

## 2018-09-09 DIAGNOSIS — E08621 Diabetes mellitus due to underlying condition with foot ulcer: Secondary | ICD-10-CM | POA: Diagnosis not present

## 2018-09-09 DIAGNOSIS — L97511 Non-pressure chronic ulcer of other part of right foot limited to breakdown of skin: Secondary | ICD-10-CM | POA: Diagnosis not present

## 2018-09-09 NOTE — Telephone Encounter (Signed)
I spoke with pt's dtr, [name] and she said they had been using [facility]. Faxed orders for readmit to [facility] for right S/P 5th metatarsectomy partial, wound vac to the right surgery site 3 x week, apply black foam to wound base, cover with adherent dressing and wound vac at continuous pressure 16mmHg.

## 2018-09-09 NOTE — Telephone Encounter (Signed)
[facility] can not accept due to pt's insurance.

## 2018-09-09 NOTE — Progress Notes (Signed)
Subjective:  Patient ID: Timothy Faulkner, male    DOB: 1946-07-15,  MRN: [id number]  Chief Complaint  Patient presents with  . Routine Post Op    pov#1 dos 01.28.2020 Wound Debridement Rt, Poss. Removal of Metatarsal Rt and/or Bone Biopsy Rt//Post Op #7 irrigation and debridement right foot metatarsal rection   DOS: 07/15/18 Procedure:  1) Incision Bone Cortex for Osteomyelitis             2) Bone Biopsy Distal Right 5th Metatarsal             3) Metatarsectomy              4) Bone Biopsy Proximal Right 5th Metatarsal             5) Application of Wound VAC  DOS: 07/17/18 Procedure:  1) Bone Biopsy Left 5th Phalanx             2) Delayed Wound Closure - Complex             3) Filleted Toe Flap  DOS: 09/07/18 Procedure:  1) Debridement of Skin, Subcutaneous Tissue, and Bone             2) Partial 5th Metatarsectomy             3) Application of Wound VAC  73 y.o. male returns for post-op check.  Denies postop issues states that the wound VAC did not have any problems with a seal leak.  Denies other issues.     Review of Systems: Negative except as noted in the HPI. Denies N/V/F/Ch.  Past Medical History:  Diagnosis Date  . Anemia   . Arthritis    Gout  . Automatic implantable cardioverter-defibrillator in situ    [employer]  . CHF   . ESRD on dialysis [facility])    [name] 03/11/2013 (03/11/2013) dialysis M/W/F  . GERD (gastroesophageal reflux disease)   . Gout    "once a year"  . Heart murmur   . HYPERCHOLESTEROLEMIA, MIXED   . HYPERTENSION   . Macular degeneration   . Osteomyelitis ([place])    right foot  . 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 Pacemaker   . Peripheral arterial disease (HCC)    left fifth toe ulcer, healing  . Pneumonia   . Shortness of breath   . 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 Wears dentures   . Wears glasses     Current Outpatient Medications:  .  aspirin EC 81 MG  tablet, Take 81 mg by mouth daily., Disp: , Rfl:  .  AURYXIA 1 GM 210 MG(Fe) tablet, Take 420 mg by mouth 3 (three) times daily with meals. , Disp: , Rfl:  .  calcitRIOL (ROCALTROL) 0.5 MCG capsule, Take 2 capsules (1 mcg total) by mouth 3 (three) times a week., Disp: 60 capsule, Rfl: 0 .  clindamycin (CLEOCIN) 150 MG capsule, Take 1 capsule (150 mg total) by mouth 2 (two) times daily., Disp: 14 capsule, Rfl: 0 .  clopidogrel (PLAVIX) 75 MG tablet, Take 1 tablet (75 mg total) by mouth daily with breakfast., Disp: 30 tablet, Rfl: 0 .  colchicine 0.6 MG tablet, Take 0.6 mg by mouth daily as needed (for gout flare ups). , Disp: , Rfl: 1 .  collagenase (SANTYL) ointment, Apply 1 application topically daily. Apply to right foot wound daily cover with dry dressing., Disp: 15 g, Rfl: 5 .  gabapentin (  NEURONTIN) 100 MG capsule, TAKE 1 CAPSULE (100 MG TOTAL) BY MOUTH AT BEDTIME., Disp: 90 capsule, Rfl: 2 .  lactulose, encephalopathy, (CHRONULAC) 10 GM/15ML SOLN, Take 20 g by mouth daily as needed (for constipation). , Disp: , Rfl:  .  lidocaine-prilocaine (EMLA) cream, Apply 1 application topically See admin instructions. Apply small amount to access site 1 to 2 hours before dialysis., Disp: , Rfl: 12 .  midodrine (PROAMATINE) 10 MG tablet, Take 10 mg by mouth 3 (three) times a week. , Disp: , Rfl: 6 .  multivitamin (RENA-VIT) TABS tablet, Take 1 tablet by mouth at bedtime., Disp: 30 tablet, Rfl: 0 .  oxyCODONE (ROXICODONE) 5 MG immediate release tablet, Take 1 tablet (5 mg total) by mouth every 8 (eight) hours as needed for severe pain., Disp: 12 tablet, Rfl: 0 .  pantoprazole (PROTONIX) 40 MG tablet, Take 1 tablet (40 mg total) by mouth daily at 6 (six) AM. (Patient taking differently: Take 40 mg by mouth daily as needed (for acid reflux). ), Disp: 30 tablet, Rfl: 0 .  SENSIPAR 30 MG tablet, Take 30 mg by mouth every other day. , Disp: , Rfl:   Social History   Tobacco Use  Smoking Status Former Smoker    . Packs/day: 0.75  . Years: 30.00  . Pack years: 22.50  . Types: Cigarettes  . Last attempt to quit: 11/25/1992  . Years since quitting: 25.8  Smokeless Tobacco Never Used    No Known Allergies Objective:  There were no vitals filed for this visit. There is no height or weight on file to calculate BMI. Constitutional Well developed. Well nourished.  Vascular Foot warm and well perfused. Capillary refill normal to all digits.   Neurologic Normal speech. Oriented to person, place, and time. Epicritic sensation to light touch grossly present bilaterally.  Dermatologic  Wound granulating well with only minimal fibrosis.  No warmth erythema signs of acute infection  Orthopedic: Tenderness to palpation noted about the surgical site. 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   Radiographs: None.  Results for orders placed or performed during the hospital encounter of 09/07/18  Aerobic/Anaerobic Culture (surgical/deep wound)     Status: None (Preliminary result)   Collection Time: 09/07/18  7:58 AM  Result Value Ref Range Status   Specimen Description BONE  Final   Special Requests RIGHT 5TH META  Final   Gram Stain NO WBC SEEN NO ORGANISMS SEEN   Final   Culture   Final    NO GROWTH 2 DAYS NO ANAEROBES ISOLATED; CULTURE IN PROGRESS FOR 5 DAYS Performed at [facility], [address] [place],  [address]    Report Status PENDING  Incomplete    Assessment:   1. Diabetic ulcer of other part of right foot associated with diabetes mellitus due to underlying condition, limited to breakdown of skin [facility])    Plan:  Patient was evaluated and treated and all questions answered.  S/p Right foot debridement -Wound appears to be healing granulating today. -Wound VAC reapplied -Micro pending.  No growth to date -Path pending -Pending follow-up with Dr. Carlis Abbott -Collagen gel applied to the wound today to promote healing -Orders written for home health care to  change wound VAC thrice weekly  Procedure: Wound VAC Application Location: R foot  Technique: Black foam to wound base, followed by adherent dressing. Set to 125 mmHg with good seal noted. Disposition: Patient tolerated procedure well.   No follow-ups on file.

## 2018-09-09 NOTE — Telephone Encounter (Signed)
Left message on mobile phone for pt to call with [facility] agency.

## 2018-09-09 NOTE — Telephone Encounter (Signed)
Faxed required form, clinicals and demographics to [facility]. 

## 2018-09-10 ENCOUNTER — Telehealth: Payer: Self-pay | Admitting: *Deleted

## 2018-09-10 ENCOUNTER — Telehealth: Payer: Self-pay | Admitting: Podiatry

## 2018-09-10 NOTE — Telephone Encounter (Signed)
Unable to contact pt on home phone, phone rang for over 1 minute and then hung up.

## 2018-09-10 NOTE — Telephone Encounter (Signed)
[facility] states they have an insurance cap and cannot take pt at his time.

## 2018-09-10 NOTE — Telephone Encounter (Signed)
Ms Gunnels called Myles Rosenthal - Scheduler and states pt will be in office tomorrow to have wound vac changed.

## 2018-09-10 NOTE — Telephone Encounter (Signed)
Left message on Timothy Faulkner mobile phone informing due to the urgency I was leaving a message with instructions for pt to come to the [facility] Saturday 09/11/2018 at 9:00am for wound vac change and to have pt call to confirm receipt.

## 2018-09-10 NOTE — Telephone Encounter (Addendum)
Left message on pt's mobile phone informing pt [name] would like him to come to the [facility] on Saturday 09/11/2018 at 9:00am for a wound vac change and to bring supplies, that I was trying to get a [facility] agency that accepted his insurance with proper staffing.

## 2018-09-10 NOTE — Telephone Encounter (Signed)
Required form, clinicals and demographics for [facility].

## 2018-09-10 NOTE — Telephone Encounter (Signed)
[facility] form, clinical and demographics faxed.

## 2018-09-10 NOTE — Telephone Encounter (Signed)
[facility] called stating that they are still unable to accept wounds at this time due to staffing. Called to inform us so that we can find the patient another home care company.

## 2018-09-11 ENCOUNTER — Ambulatory Visit (INDEPENDENT_AMBULATORY_CARE_PROVIDER_SITE_OTHER): Payer: Medicare Other | Admitting: Podiatry

## 2018-09-11 DIAGNOSIS — L97511 Non-pressure chronic ulcer of other part of right foot limited to breakdown of skin: Secondary | ICD-10-CM | POA: Diagnosis not present

## 2018-09-11 DIAGNOSIS — E08621 Diabetes mellitus due to underlying condition with foot ulcer: Secondary | ICD-10-CM | POA: Diagnosis not present

## 2018-09-11 NOTE — Progress Notes (Signed)
Subjective:  Patient ID: Timothy Faulkner, male    DOB: Nov 04, 1945,  MRN: [id number]  No chief complaint on file.  DOS: 07/15/18 Procedure:  1) Incision Bone Cortex for Osteomyelitis             2) Bone Biopsy Distal Right 5th Metatarsal             3) Metatarsectomy              4) Bone Biopsy Proximal Right 5th Metatarsal             5) Application of Wound VAC  DOS: 07/17/18 Procedure:  1) Bone Biopsy Left 5th Phalanx             2) Delayed Wound Closure - Complex             3) Filleted Toe Flap  DOS: 09/07/18 Procedure:  1) Debridement of Skin, Subcutaneous Tissue, and Bone             2) Partial 5th Metatarsectomy             3) Application of Wound VAC  73 y.o. male returns for wound VAC change. Denies issues with the VAC.  Review of Systems: Negative except as noted in the HPI. Denies N/V/F/Ch.  Past Medical History:  Diagnosis Date  . Anemia   . Arthritis    Gout  . Automatic implantable cardioverter-defibrillator in situ    [employer]  . CHF   . ESRD on dialysis [facility])    [name] 03/11/2013 (03/11/2013) dialysis M/W/F  . GERD (gastroesophageal reflux disease)   . Gout    "once a year"  . Heart murmur   . HYPERCHOLESTEROLEMIA, MIXED   . HYPERTENSION   . Macular degeneration   . Osteomyelitis ([place])    right foot  . 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 Pacemaker   . Peripheral arterial disease (HCC)    left fifth toe ulcer, healing  . Pneumonia   . Shortness of breath   . 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 Wears dentures   . Wears glasses     Current Outpatient Medications:  .  aspirin EC 81 MG tablet, Take 81 mg by mouth daily., Disp: , Rfl:  .  AURYXIA 1 GM 210 MG(Fe) tablet, Take 420 mg by mouth 3 (three) times daily with meals. , Disp: , Rfl:  .  calcitRIOL (ROCALTROL) 0.5 MCG capsule, Take 2 capsules (1 mcg total) by mouth 3 (three) times a week., Disp: 60 capsule, Rfl:  0 .  clindamycin (CLEOCIN) 150 MG capsule, Take 1 capsule (150 mg total) by mouth 2 (two) times daily., Disp: 14 capsule, Rfl: 0 .  clopidogrel (PLAVIX) 75 MG tablet, Take 1 tablet (75 mg total) by mouth daily with breakfast., Disp: 30 tablet, Rfl: 0 .  colchicine 0.6 MG tablet, Take 0.6 mg by mouth daily as needed (for gout flare ups). , Disp: , Rfl: 1 .  collagenase (SANTYL) ointment, Apply 1 application topically daily. Apply to right foot wound daily cover with dry dressing., Disp: 15 g, Rfl: 5 .  gabapentin (NEURONTIN) 100 MG capsule, TAKE 1 CAPSULE (100 MG TOTAL) BY MOUTH AT BEDTIME., Disp: 90 capsule, Rfl: 2 .  lactulose, encephalopathy, (CHRONULAC) 10 GM/15ML SOLN, Take 20 g by mouth daily as needed (for constipation). , Disp: , Rfl:  .  lidocaine-prilocaine (EMLA) cream, Apply 1 application topically See admin  instructions. Apply small amount to access site 1 to 2 hours before dialysis., Disp: , Rfl: 12 .  midodrine (PROAMATINE) 10 MG tablet, Take 10 mg by mouth 3 (three) times a week. , Disp: , Rfl: 6 .  multivitamin (RENA-VIT) TABS tablet, Take 1 tablet by mouth at bedtime., Disp: 30 tablet, Rfl: 0 .  oxyCODONE (ROXICODONE) 5 MG immediate release tablet, Take 1 tablet (5 mg total) by mouth every 8 (eight) hours as needed for severe pain., Disp: 12 tablet, Rfl: 0 .  pantoprazole (PROTONIX) 40 MG tablet, Take 1 tablet (40 mg total) by mouth daily at 6 (six) AM. (Patient taking differently: Take 40 mg by mouth daily as needed (for acid reflux). ), Disp: 30 tablet, Rfl: 0 .  SENSIPAR 30 MG tablet, Take 30 mg by mouth every other day. , Disp: , Rfl:   Social History   Tobacco Use  Smoking Status Former Smoker  . Packs/day: 0.75  . Years: 30.00  . Pack years: 22.50  . Types: Cigarettes  . Last attempt to quit: 11/25/1992  . Years since quitting: 25.8  Smokeless Tobacco Never Used    No Known Allergies Objective:  There were no vitals filed for this visit. There is no height or  weight on file to calculate BMI. Constitutional Well developed. Well nourished.  Vascular Foot warm and well perfused. Capillary refill normal to all digits.   Neurologic Normal speech. Oriented to person, place, and time. Epicritic sensation to light touch grossly present bilaterally.  Dermatologic  Wound granulating well, no warmth erythema signs of acute infection  Orthopedic: Tenderness to palpation noted about the surgical site. 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   Radiographs: None.  Results for orders placed or performed during the hospital encounter of 09/07/18  Aerobic/Anaerobic Culture (surgical/deep wound)     Status: None (Preliminary result)   Collection Time: 09/07/18  7:58 AM  Result Value Ref Range Status   Specimen Description BONE  Final   Special Requests RIGHT 5TH META  Final   Gram Stain NO WBC SEEN NO ORGANISMS SEEN   Final   Culture   Final    NO GROWTH 3 DAYS NO ANAEROBES ISOLATED; CULTURE IN PROGRESS FOR 5 DAYS Performed at [facility], [address], [place], [place] [address]    Report Status PENDING  Incomplete    Assessment:   1. Diabetic ulcer of other part of right foot associated with diabetes mellitus due to underlying condition, limited to breakdown of skin [facility])    Plan:  Patient was evaluated and treated and all questions answered.  S/p Right foot debridement -Micro pending.  No growth to date -Path pending -Pending follow-up with Dr. Carlis Abbott -Collagen gel applied to the wound today to promote healing, followed by [facility] -Will reapply wound VAC Tuesday or apply Santyl WTD pending appearance of wound.  Procedure: Wound VAC Application Location: R lateral foot Technique: Black foam to wound base, followed by adherent dressing. Set to 125 mmHg with good seal noted. Disposition: Patient tolerated procedure well.  No follow-ups on file.

## 2018-09-12 LAB — AEROBIC/ANAEROBIC CULTURE W GRAM STAIN (SURGICAL/DEEP WOUND): Gram Stain: NONE SEEN

## 2018-09-12 LAB — AEROBIC/ANAEROBIC CULTURE (SURGICAL/DEEP WOUND): CULTURE: NO GROWTH

## 2018-09-14 ENCOUNTER — Ambulatory Visit (INDEPENDENT_AMBULATORY_CARE_PROVIDER_SITE_OTHER): Payer: Medicare Other

## 2018-09-14 DIAGNOSIS — E08621 Diabetes mellitus due to underlying condition with foot ulcer: Secondary | ICD-10-CM | POA: Diagnosis not present

## 2018-09-14 DIAGNOSIS — L97511 Non-pressure chronic ulcer of other part of right foot limited to breakdown of skin: Secondary | ICD-10-CM | POA: Diagnosis not present

## 2018-09-14 DIAGNOSIS — Z9889 Other specified postprocedural states: Secondary | ICD-10-CM

## 2018-09-14 LAB — JAK 2 V617F (GENPATH)

## 2018-09-14 NOTE — Progress Notes (Signed)
Patient is here today for wound VAC change, he is status post debridement of right foot wound, date of surgery 09/07/2018.  He denies fever chills nausea vomiting, and overall says that he is feeling well.  Granulation tissue noted in wound, no warmth, no erythema, very minimal swelling.  No signs and symptoms of infection.  Collagen gel was applied to the wound and wound VAC was applied with suction 125 mmHg.  Patient stated comfort with wound VAC and dressing.  He was advised to remain in his surgical shoe and keep his follow-up appointment this week with [name].

## 2018-09-16 ENCOUNTER — Ambulatory Visit (INDEPENDENT_AMBULATORY_CARE_PROVIDER_SITE_OTHER): Payer: Medicare Other | Admitting: Podiatry

## 2018-09-16 ENCOUNTER — Encounter: Payer: Self-pay | Admitting: Podiatry

## 2018-09-16 DIAGNOSIS — L97511 Non-pressure chronic ulcer of other part of right foot limited to breakdown of skin: Secondary | ICD-10-CM

## 2018-09-16 DIAGNOSIS — E08621 Diabetes mellitus due to underlying condition with foot ulcer: Secondary | ICD-10-CM

## 2018-09-17 NOTE — Progress Notes (Signed)
Subjective:  Patient ID: Timothy Faulkner, male    DOB: 1945/09/12,  MRN: [id number]  Chief Complaint  Patient presents with  . Wound Check    DOS 09/07/2018 wound debridement R; "doing alright; haven't had much pain"   DOS: 07/15/18 Procedure:  1) Incision Bone Cortex for Osteomyelitis             2) Bone Biopsy Distal Right 5th Metatarsal             3) Metatarsectomy              4) Bone Biopsy Proximal Right 5th Metatarsal             5) Application of Wound VAC  DOS: 07/17/18 Procedure:  1) Bone Biopsy Left 5th Phalanx             2) Delayed Wound Closure - Complex             3) Filleted Toe Flap  DOS: 09/07/18 Procedure:  1) Debridement of Skin, Subcutaneous Tissue, and Bone             2) Partial 5th Metatarsectomy             3) Application of Wound VAC  73 y.o. male returns for wound VAC change. Denies issues with the VAC.  Review of Systems: Negative except as noted in the HPI. Denies N/V/F/Ch.  Past Medical History:  Diagnosis Date  . Anemia   . Arthritis    Gout  . Automatic implantable cardioverter-defibrillator in situ    [employer]  . CHF   . ESRD on dialysis [facility])    [name] 03/11/2013 (03/11/2013) dialysis M/W/F  . GERD (gastroesophageal reflux disease)   . Gout    "once a year"  . Heart murmur   . HYPERCHOLESTEROLEMIA, MIXED   . HYPERTENSION   . Macular degeneration   . Osteomyelitis ([place])    right foot  . 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 Pacemaker   . Peripheral arterial disease (HCC)    left fifth toe ulcer, healing  . Pneumonia   . Shortness of breath   . 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 Wears dentures   . Wears glasses     Current Outpatient Medications:  .  aspirin EC 81 MG tablet, Take 81 mg by mouth daily., Disp: , Rfl:  .  AURYXIA 1 GM 210 MG(Fe) tablet, Take 420 mg by mouth 3 (three) times daily with meals. , Disp: , Rfl:  .  calcitRIOL  (ROCALTROL) 0.5 MCG capsule, Take 2 capsules (1 mcg total) by mouth 3 (three) times a week., Disp: 60 capsule, Rfl: 0 .  clindamycin (CLEOCIN) 150 MG capsule, Take 1 capsule (150 mg total) by mouth 2 (two) times daily., Disp: 14 capsule, Rfl: 0 .  clopidogrel (PLAVIX) 75 MG tablet, Take 1 tablet (75 mg total) by mouth daily with breakfast., Disp: 30 tablet, Rfl: 0 .  colchicine 0.6 MG tablet, Take 0.6 mg by mouth daily as needed (for gout flare ups). , Disp: , Rfl: 1 .  collagenase (SANTYL) ointment, Apply 1 application topically daily. Apply to right foot wound daily cover with dry dressing., Disp: 15 g, Rfl: 5 .  gabapentin (NEURONTIN) 100 MG capsule, TAKE 1 CAPSULE (100 MG TOTAL) BY MOUTH AT BEDTIME., Disp: 90 capsule, Rfl: 2 .  lactulose, encephalopathy, (CHRONULAC) 10 GM/15ML SOLN, Take 20 g by mouth daily  as needed (for constipation). , Disp: , Rfl:  .  lidocaine-prilocaine (EMLA) cream, Apply 1 application topically See admin instructions. Apply small amount to access site 1 to 2 hours before dialysis., Disp: , Rfl: 12 .  midodrine (PROAMATINE) 10 MG tablet, Take 10 mg by mouth 3 (three) times a week. , Disp: , Rfl: 6 .  multivitamin (RENA-VIT) TABS tablet, Take 1 tablet by mouth at bedtime., Disp: 30 tablet, Rfl: 0 .  oxyCODONE (ROXICODONE) 5 MG immediate release tablet, Take 1 tablet (5 mg total) by mouth every 8 (eight) hours as needed for severe pain., Disp: 12 tablet, Rfl: 0 .  pantoprazole (PROTONIX) 40 MG tablet, Take 1 tablet (40 mg total) by mouth daily at 6 (six) AM. (Patient taking differently: Take 40 mg by mouth daily as needed (for acid reflux). ), Disp: 30 tablet, Rfl: 0 .  SENSIPAR 30 MG tablet, Take 30 mg by mouth every other day. , Disp: , Rfl:   Social History   Tobacco Use  Smoking Status Former Smoker  . Packs/day: 0.75  . Years: 30.00  . Pack years: 22.50  . Types: Cigarettes  . Last attempt to quit: 11/25/1992  . Years since quitting: 25.8  Smokeless Tobacco Never  Used    No Known Allergies Objective:  There were no vitals filed for this visit. There is no height or weight on file to calculate BMI. Constitutional Well developed. Well nourished.  Vascular Foot warm and well perfused. Capillary refill normal to all digits.   Neurologic Normal speech. Oriented to person, place, and time. Epicritic sensation to light touch grossly present bilaterally.  Dermatologic  Wound granulating well, no warmth erythema signs of acute infection  Orthopedic: Tenderness to palpation noted about the surgical site. 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   Radiographs: None.  Results for orders placed or performed during the hospital encounter of 09/07/18  Aerobic/Anaerobic Culture (surgical/deep wound)     Status: None   Collection Time: 09/07/18  7:58 AM  Result Value Ref Range Status   Specimen Description BONE  Final   Special Requests RIGHT 5TH META  Final   Gram Stain   Final    NO WBC SEEN NO ORGANISMS SEEN Performed at [facility], [address] [place], [place] [address]    Culture No growth aerobically or anaerobically.  Final   Report Status 09/12/2018 FINAL  Final    Assessment:   1. Diabetic ulcer of other part of right foot associated with diabetes mellitus due to underlying condition, limited to breakdown of skin [facility])    Plan:  Patient was evaluated and treated and all questions answered.  S/p Right foot debridement -Wound VAC reapplied to good seal. F/u in 1 week for change.  No follow-ups on file.

## 2018-09-18 ENCOUNTER — Ambulatory Visit (INDEPENDENT_AMBULATORY_CARE_PROVIDER_SITE_OTHER): Payer: Medicare Other | Admitting: Podiatry

## 2018-09-18 DIAGNOSIS — E08621 Diabetes mellitus due to underlying condition with foot ulcer: Secondary | ICD-10-CM

## 2018-09-18 DIAGNOSIS — L97511 Non-pressure chronic ulcer of other part of right foot limited to breakdown of skin: Secondary | ICD-10-CM

## 2018-09-21 ENCOUNTER — Ambulatory Visit: Payer: Medicare Other

## 2018-09-21 DIAGNOSIS — L97511 Non-pressure chronic ulcer of other part of right foot limited to breakdown of skin: Principal | ICD-10-CM

## 2018-09-21 DIAGNOSIS — E08621 Diabetes mellitus due to underlying condition with foot ulcer: Secondary | ICD-10-CM

## 2018-09-22 NOTE — Progress Notes (Signed)
Patient is here today for dressing change, wound care.  He states that overall he is feeling okay and not have any problems at this time.  Removed wound VAC, cleansed wound with sterile normal saline, apply collagen gel and Adaptic along with dry sterile dressing.  He is to follow-up at his regular scheduled appointment with [name].

## 2018-09-23 ENCOUNTER — Ambulatory Visit (INDEPENDENT_AMBULATORY_CARE_PROVIDER_SITE_OTHER): Payer: Medicare Other | Admitting: Podiatry

## 2018-09-23 ENCOUNTER — Telehealth: Payer: Self-pay | Admitting: *Deleted

## 2018-09-23 DIAGNOSIS — L97511 Non-pressure chronic ulcer of other part of right foot limited to breakdown of skin: Secondary | ICD-10-CM

## 2018-09-23 DIAGNOSIS — Z9889 Other specified postprocedural states: Secondary | ICD-10-CM

## 2018-09-23 DIAGNOSIS — E08621 Diabetes mellitus due to underlying condition with foot ulcer: Secondary | ICD-10-CM

## 2018-09-23 MED ORDER — COLLAGENASE 250 UNIT/GM EX OINT: 1.0000 "application " | TOPICAL_OINTMENT | Freq: Every day | CUTANEOUS | 5 refills | Status: DC

## 2018-09-23 NOTE — Telephone Encounter (Signed)
[name] ordered Timothy Faulkner wet-to-dry dressing right foot 3 times a week. [facility] states they do accept [facility] Medicare. Faxed required form, clinicals and demographics to [place].

## 2018-09-23 NOTE — Progress Notes (Signed)
Subjective:  Patient ID: Timothy Faulkner, male    DOB: 03-18-1946,  MRN: [id number]  Chief Complaint  Patient presents with  . Routine Post Op     dos 01.28.2020 Wound Debridement Rt, Poss. Removal of Metatarsal Rt and/or Bone Biopsy Rt " there have been no changes in my foot, no pain"    DOS: 07/15/18 Procedure:  1) Incision Bone Cortex for Osteomyelitis             2) Bone Biopsy Distal Right 5th Metatarsal             3) Metatarsectomy              4) Bone Biopsy Proximal Right 5th Metatarsal             5) Application of Wound VAC  DOS: 07/17/18 Procedure:  1) Bone Biopsy Left 5th Phalanx             2) Delayed Wound Closure - Complex             3) Filleted Toe Flap  DOS: 09/07/18 Procedure:  1) Debridement of Skin, Subcutaneous Tissue, and Bone             2) Partial 5th Metatarsectomy             3) Application of Wound VAC  73 y.o. male returns for wound VAC change. Denies issues with the VAC.  Review of Systems: Negative except as noted in the HPI. Denies N/V/F/Ch.  Past Medical History:  Diagnosis Date  . Anemia   . Arthritis    Gout  . Automatic implantable cardioverter-defibrillator in situ    [employer]  . CHF   . ESRD on dialysis [facility])    [name] 03/11/2013 (03/11/2013) dialysis M/W/F  . GERD (gastroesophageal reflux disease)   . Gout    "once a year"  . Heart murmur   . HYPERCHOLESTEROLEMIA, MIXED   . HYPERTENSION   . Macular degeneration   . Osteomyelitis ([place])    right foot  . 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 Pacemaker   . Peripheral arterial disease (HCC)    left fifth toe ulcer, healing  . Pneumonia   . Shortness of breath   . 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 Wears dentures   . Wears glasses     Current Outpatient Medications:  .  aspirin EC 81 MG tablet, Take 81 mg by mouth daily., Disp: , Rfl:  .  AURYXIA 1 GM 210 MG(Fe) tablet, Take 420 mg by mouth 3  (three) times daily with meals. , Disp: , Rfl:  .  calcitRIOL (ROCALTROL) 0.5 MCG capsule, Take 2 capsules (1 mcg total) by mouth 3 (three) times a week., Disp: 60 capsule, Rfl: 0 .  clindamycin (CLEOCIN) 150 MG capsule, Take 1 capsule (150 mg total) by mouth 2 (two) times daily., Disp: 14 capsule, Rfl: 0 .  clopidogrel (PLAVIX) 75 MG tablet, Take 1 tablet (75 mg total) by mouth daily with breakfast., Disp: 30 tablet, Rfl: 0 .  colchicine 0.6 MG tablet, Take 0.6 mg by mouth daily as needed (for gout flare ups). , Disp: , Rfl: 1 .  collagenase (SANTYL) ointment, Apply 1 application topically daily. Apply to right foot wound daily cover with dry dressing., Disp: 15 g, Rfl: 5 .  collagenase (SANTYL) ointment, Apply 1 application topically daily., Disp: 15 g, Rfl: 5 .  gabapentin (NEURONTIN)  100 MG capsule, TAKE 1 CAPSULE (100 MG TOTAL) BY MOUTH AT BEDTIME., Disp: 90 capsule, Rfl: 2 .  lactulose, encephalopathy, (CHRONULAC) 10 GM/15ML SOLN, Take 20 g by mouth daily as needed (for constipation). , Disp: , Rfl:  .  lidocaine-prilocaine (EMLA) cream, Apply 1 application topically See admin instructions. Apply small amount to access site 1 to 2 hours before dialysis., Disp: , Rfl: 12 .  midodrine (PROAMATINE) 10 MG tablet, Take 10 mg by mouth 3 (three) times a week. , Disp: , Rfl: 6 .  multivitamin (RENA-VIT) TABS tablet, Take 1 tablet by mouth at bedtime., Disp: 30 tablet, Rfl: 0 .  oxyCODONE (ROXICODONE) 5 MG immediate release tablet, Take 1 tablet (5 mg total) by mouth every 8 (eight) hours as needed for severe pain., Disp: 12 tablet, Rfl: 0 .  pantoprazole (PROTONIX) 40 MG tablet, Take 1 tablet (40 mg total) by mouth daily at 6 (six) AM. (Patient taking differently: Take 40 mg by mouth daily as needed (for acid reflux). ), Disp: 30 tablet, Rfl: 0 .  SENSIPAR 30 MG tablet, Take 30 mg by mouth every other day. , Disp: , Rfl:   Social History   Tobacco Use  Smoking Status Former Smoker  . Packs/day:  0.75  . Years: 30.00  . Pack years: 22.50  . Types: Cigarettes  . Last attempt to quit: 11/25/1992  . Years since quitting: 25.8  Smokeless Tobacco Never Used    No Known Allergies Objective:   Vitals:   There is no height or weight on file to calculate BMI. Constitutional Well developed. Well nourished.  Vascular Foot warm and well perfused. Capillary refill normal to all digits.   Neurologic Normal speech. Oriented to person, place, and time. Epicritic sensation to light touch grossly present bilaterally.  Dermatologic Wound granulating well, no warmth erythema signs of acute infection  7x4  Orthopedic: Tenderness to palpation noted about the surgical site. 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   Radiographs: None.  Results for orders placed or performed during the hospital encounter of 09/07/18  Aerobic/Anaerobic Culture (surgical/deep wound)     Status: None   Collection Time: 09/07/18  7:58 AM  Result Value Ref Range Status   Specimen Description BONE  Final   Special Requests RIGHT 5TH META  Final   Gram Stain   Final    NO WBC SEEN NO ORGANISMS SEEN Performed at [facility], [address] [place], [place] [address]    Culture No growth aerobically or anaerobically.  Final   Report Status 09/12/2018 FINAL  Final    Assessment:   1. Diabetic ulcer of other part of right foot associated with diabetes mellitus due to underlying condition, limited to breakdown of skin ([place])   2. Post-operative state    Plan:  Patient was evaluated and treated and all questions answered.  S/p Right foot debridement -Micro pending.  No growth to date -Path pending -Pending follow-up with Dr. Carlis Abbott -Collagen gel applied to the wound today to promote healing, followed by [facility] -Will reapply wound VAC Tuesday or apply Santyl WTD pending appearance of wound.  Procedure: Wound VAC Application Location: R lateral foot Technique: Black foam to wound base,  followed by adherent dressing. Set to 125 mmHg with good seal noted. Disposition: Patient tolerated procedure well.  Return in about 1 week (around 09/30/2018).

## 2018-09-24 NOTE — Telephone Encounter (Signed)
Faxed required form, clinicals and demographics to [facility].

## 2018-09-28 ENCOUNTER — Telehealth: Payer: Self-pay | Admitting: Podiatry

## 2018-09-28 NOTE — Telephone Encounter (Signed)
Amber, RN with [facility] called to get verbal orders for nursing. Requesting two times a week for two weeks then once a week for three weeks for his wound care. I can be reached at [phone].

## 2018-09-29 NOTE — Telephone Encounter (Signed)
I informed [facility] of Dr. Eleanora Neighbor 09/29/2018 9:02am orders.

## 2018-09-29 NOTE — Telephone Encounter (Signed)
Approved. Santyl WTD during that time.

## 2018-09-30 ENCOUNTER — Ambulatory Visit (INDEPENDENT_AMBULATORY_CARE_PROVIDER_SITE_OTHER): Payer: Medicare Other | Admitting: Podiatry

## 2018-09-30 DIAGNOSIS — L97511 Non-pressure chronic ulcer of other part of right foot limited to breakdown of skin: Secondary | ICD-10-CM

## 2018-09-30 DIAGNOSIS — E08621 Diabetes mellitus due to underlying condition with foot ulcer: Secondary | ICD-10-CM

## 2018-10-05 ENCOUNTER — Encounter: Payer: Self-pay | Admitting: Vascular Surgery

## 2018-10-05 ENCOUNTER — Other Ambulatory Visit (HOSPITAL_COMMUNITY): Payer: Self-pay | Admitting: Vascular Surgery

## 2018-10-05 ENCOUNTER — Ambulatory Visit (HOSPITAL_COMMUNITY)
Admission: RE | Admit: 2018-10-05 | Discharge: 2018-10-05 | Disposition: A | Discharge status: Home / Self Care | Payer: Medicare Other | Source: Ambulatory Visit | Attending: Vascular Surgery | Admitting: Vascular Surgery

## 2018-10-05 ENCOUNTER — Other Ambulatory Visit: Payer: Self-pay

## 2018-10-05 ENCOUNTER — Ambulatory Visit (INDEPENDENT_AMBULATORY_CARE_PROVIDER_SITE_OTHER): Payer: Medicare Other | Admitting: Vascular Surgery

## 2018-10-05 VITALS — BP 86/57 | HR 68 | Temp 97.7°F | Resp 20 | Ht 68.0 in | Wt 173.9 lb

## 2018-10-05 DIAGNOSIS — L97919 Non-pressure chronic ulcer of unspecified part of right lower leg with unspecified severity: Secondary | ICD-10-CM | POA: Insufficient documentation

## 2018-10-05 DIAGNOSIS — I739 Peripheral vascular disease, unspecified: Secondary | ICD-10-CM

## 2018-10-05 NOTE — Progress Notes (Signed)
Patient name: Timothy Faulkner MRN: [id number] DOB: 06-Nov-1945 Sex: male  REASON FOR VISIT: Follow-up status post right lower extremity intervention for nonhealing wound  HPI: Timothy Faulkner is a 73 y.o. male with multiple medical problems that previously underwent right lower extremity intervention for critical limb ischemia with tissue loss.  He subsequently had right fifth toe amputation with debridement of the foot by podiatry and is currently on a Santyl dressing.  He is really unable to provide much history about healing and states he does not really know if the wound is getting better.  Getting wound care by his daughter at home.  Looks like the last debridement by podiatry was done at the end of January.  He previous underwent retrograde right peroneal cannulation with right below-knee popliteal artery tibioperoneal trunk and peroneal angioplasty and subsequent stent placement in the tibioperoneal and peroneal artery.  He has a brisk peroneal signal on exam today with monophasic dorsalis pedis and posterior tibial signals.  Past Medical History:  Diagnosis Date  . Anemia   . Arthritis    Gout  . Automatic implantable cardioverter-defibrillator in situ    [employer]  . CHF   . ESRD on dialysis [facility])    [name] 03/11/2013 (03/11/2013) dialysis M/W/F  . GERD (gastroesophageal reflux disease)   . Gout    "once a year"  . Heart murmur   . HYPERCHOLESTEROLEMIA, MIXED   . HYPERTENSION   . Macular degeneration   . Osteomyelitis ([place])    right foot  . 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 Pacemaker   . Peripheral arterial disease (HCC)    left fifth toe ulcer, healing  . Pneumonia   . Shortness of breath   . 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 Wears dentures   . Wears glasses     Past Surgical History:  Procedure Laterality Date  . A/V FISTULAGRAM Left 07/20/2018   Procedure: A/V FISTULAGRAM;  Surgeon: [name],  MD;  Location: [facility];  Service: Cardiovascular;  Laterality: Left;  . ABDOMINAL AORTOGRAM W/LOWER EXTREMITY Bilateral 07/20/2018   Procedure: ABDOMINAL AORTOGRAM W/LOWER EXTREMITY;  Surgeon: [name], MD;  Location: [place] [facility];  Service: Cardiovascular;  Laterality: Bilateral;  . AMPUTATION Right 09/07/2018   Procedure: Right fifth metatarsectomy;  Surgeon: [name], DPM;  Location: [place];  Service: Podiatry;  Laterality: Right;  . AV FISTULA PLACEMENT Right 12/13/2012   Procedure: ARTERIOVENOUS (AV) FISTULA CREATION;  Surgeon: [name], MD;  Location: [facility];  Service: Vascular;  Laterality: Right;  Ultrasound guided  . AV FISTULA PLACEMENT Left 05/07/2016   Procedure: LEFT RADIOCEPHALIC ARTERIOVENOUS (AV) FISTULA CREATION;  Surgeon: [name], MD;  Location: [place];  Service: Vascular;  Laterality: Left;  . BASCILIC VEIN TRANSPOSITION Right 03/26/2016   Procedure: RIGHT BASILIC VEIN TRANSPOSITION;  Surgeon: [name], MD;  Location: [place];  Service: Vascular;  Laterality: Right;  . CARDIAC CATHETERIZATION    . CARDIAC DEFIBRILLATOR PLACEMENT     Chemical engineer  . EYE SURGERY Bilateral    Cataract  . FISTULOGRAM Left 04/22/2018   Procedure: FISTULOGRAM UPPER EXTREMITY;  Surgeon: [name], MD;  Location: [place];  Service: Vascular;  Laterality: Left;  . I&D EXTREMITY Right 07/15/2018   Procedure: IRRIGATION AND DEBRIDEMENT RIGHT FOOT;  Surgeon: [name], DPM;  Location: [place];  Service: Podiatry;  Laterality: Right;  . INCISION AND DRAINAGE ABSCESS / HEMATOMA OF BURSA / KNEE / THIGH Left 2012   "knee" (03/11/2013)  . INSERT / REPLACE / REMOVE PACEMAKER    . INSERTION OF DIALYSIS CATHETER Left 04/22/2018   Procedure: INSERTION OF DIALYSIS CATHETER;  Surgeon: [name], MD;  Location: [place];  Service: Vascular;  Laterality: Left;  . IR FLUORO GUIDE CV LINE LEFT  07/15/2018  . IR PTA VENOUS EXCEPT DIALYSIS CIRCUIT  07/15/2018    . LOWER EXTREMITY ANGIOGRAPHY Right 07/21/2018   Procedure: LOWER EXTREMITY ANGIOGRAPHY;  Surgeon: [name], MD;  Location: [place] [facility];  Service: Cardiovascular;  Laterality: Right;  . METATARSAL HEAD EXCISION Right 07/15/2018   Procedure: METATARSAL RESECTION;  Surgeon: [name], DPM;  Location: [place];  Service: Podiatry;  Laterality: Right;  . MULTIPLE TOOTH EXTRACTIONS    . PERIPHERAL VASCULAR INTERVENTION Right 07/21/2018   Procedure: PERIPHERAL VASCULAR INTERVENTION;  Surgeon: [name], MD;  Location: [place] [facility];  Service: Cardiovascular;  Laterality: Right;  peroneal stents  . REVISON OF ARTERIOVENOUS FISTULA Right 7/78/2423   Procedure: Plication OF Right Arm ARTERIOVENOUS FISTULA;  Surgeon: Elam Dutch, MD;  Location: [facility];  Service: Vascular;  Laterality: Right;  . REVISON OF ARTERIOVENOUS FISTULA Left 04/22/2018   Procedure: REVISION OF RADIOCEPHALIC ARTERIOVENOUS FISTULA;  Surgeon: [name], MD;  Location: [place];  Service: Vascular;  Laterality: Left;  . SHUNTOGRAM N/A 05/31/2013   Procedure: Earney Mallet;  Surgeon: [name], MD;  Location: [facility];  Service: Cardiovascular;  Laterality: N/A;  . UPPER EXTREMITY VENOGRAPHY Right 07/23/2018   Procedure: UPPER EXTREMITY VENOGRAPHY;  Surgeon: [name], MD;  Location: [place] [facility];  Service: Cardiovascular;  Laterality: Right;  . WOUND DEBRIDEMENT Right 07/17/2018   Procedure: Wound Debridement; Closure Filleted toe flap Right Foot;  Surgeon: [name], DPM;  Location: [place];  Service: Podiatry;  Laterality: Right;  . WOUND DEBRIDEMENT Right 09/07/2018   Procedure: Debridement Right Foot Wound, application of wound vac;  Surgeon: [name], DPM;  Location: [place];  Service: Podiatry;  Laterality: Right;    Family History  Problem Relation Age of Onset  . Heart disease Father   . CAD Father     SOCIAL HISTORY: Social History    Tobacco Use  . Smoking status: Former Smoker    Packs/day: 0.75    Years: 30.00    Pack years: 22.50    Types: Cigarettes    Last attempt to quit: 11/25/1992    Years since quitting: 25.8  . Smokeless tobacco: Never Used  Substance Use Topics  . Alcohol use: Not Currently    No Known Allergies  Current Outpatient Medications  Medication Sig Dispense Refill  . aspirin EC 81 MG tablet Take 81 mg by mouth daily.    Lorin Picket 1 GM 210 MG(Fe) tablet Take 420 mg by mouth 3 (three) times daily with meals.     . calcitRIOL (ROCALTROL) 0.5 MCG capsule Take 2 capsules (1 mcg total) by mouth 3 (three) times a week. 60 capsule 0  . clindamycin (CLEOCIN) 150 MG capsule Take 1 capsule (150 mg total) by mouth 2 (two) times daily. 14 capsule 0  . clopidogrel (PLAVIX) 75 MG tablet Take 1 tablet (75 mg total) by mouth daily with breakfast. 30 tablet 0  . colchicine 0.6 MG tablet Take 0.6 mg by mouth daily as needed (for gout flare  ups).   1  . collagenase (SANTYL) ointment Apply 1 application topically daily. Apply to right foot wound daily cover with dry dressing. 15 g 5  . collagenase (SANTYL) ointment Apply 1 application topically daily. 15 g 5  . gabapentin (NEURONTIN) 100 MG capsule TAKE 1 CAPSULE (100 MG TOTAL) BY MOUTH AT BEDTIME. 90 capsule 2  . lactulose, encephalopathy, (CHRONULAC) 10 GM/15ML SOLN Take 20 g by mouth daily as needed (for constipation).     Marland Kitchen lidocaine-prilocaine (EMLA) cream Apply 1 application topically See admin instructions. Apply small amount to access site 1 to 2 hours before dialysis.  12  . midodrine (PROAMATINE) 10 MG tablet Take 10 mg by mouth 3 (three) times a week.   6  . multivitamin (RENA-VIT) TABS tablet Take 1 tablet by mouth at bedtime. 30 tablet 0  . oxyCODONE (ROXICODONE) 5 MG immediate release tablet Take 1 tablet (5 mg total) by mouth every 8 (eight) hours as needed for severe pain. 12 tablet 0  . pantoprazole (PROTONIX) 40 MG tablet Take 1 tablet (40 mg  total) by mouth daily at 6 (six) AM. (Patient taking differently: Take 40 mg by mouth daily as needed (for acid reflux). ) 30 tablet 0  . SENSIPAR 30 MG tablet Take 30 mg by mouth every other day.      No current facility-administered medications for this visit.     REVIEW OF SYSTEMS:  [X]  denotes positive finding, [ ]  denotes negative finding Cardiac  Comments:  Chest pain or chest pressure:    Shortness of breath upon exertion:    Short of breath when lying flat:    Irregular heart rhythm:        Vascular    Pain in calf, thigh, or hip brought on by ambulation:    Pain in feet at night that wakes you up from your sleep:     Blood clot in your veins:    Leg swelling:         Pulmonary    Oxygen at home:    Productive cough:     Wheezing:         Neurologic    Sudden weakness in arms or legs:     Sudden numbness in arms or legs:     Sudden onset of difficulty speaking or slurred speech:    Temporary loss of vision in one eye:     Problems with dizziness:         Gastrointestinal    Blood in stool:     Vomited blood:         Genitourinary    Burning when urinating:     Blood in urine:        Psychiatric    Major depression:         Hematologic    Bleeding problems:    Problems with blood clotting too easily:        Skin    Rashes or ulcers:        Constitutional    Fever or chills:      PHYSICAL EXAM: Vitals:   10/05/18 1118  BP: (!) 86/57  Pulse: 68  Resp: 20  Temp: 97.7 F (36.5 C)  SpO2: 97%  Weight: 173 lb 15.1 oz (78.9 kg)  Height: 5\' 8"  (1.727 m)    GENERAL: The patient is a well-nourished male, in no acute distress. The vital signs are documented above. CARDIAC: There is a regular rate and rhythm.  VASCULAR:  Biphasic peroneal signal right foot Monophasic DP/PT signal right foot Large open wound on lateral foot s/p debridement by podiatry, clean based PULMONARY: There is good air exchange bilaterally without wheezing or rales. ABDOMEN:  Soft and non-tender with normal pitched bowel sounds.  MUSCULOSKELETAL: There are no major deformities or cyanosis. NEUROLOGIC: No focal weakness or paresthesias are detected.   DATA:   Right lower extremity intervention patent, peroneal dominant runoff right lower extremity, biphasic and no focal stenosis  Assessment/Plan:  73 year old male with diabetic ulcer of the right foot that has undergone significant debridement of foot with partial fifth metatarsectomy by podiatry.  He previously had a right retrograde cannulation of the peroneal artery with below-knee popliteal, tibioperoneal trunk and peroneal angioplasty that required stenting given a retrograde dissection.  He has a brisk peroneal signal on exam today with weaker dorsalis pedis and posterior tibial signals (his previous baseline exam).  Right lower extremity arterial duplex confirms previous revascularization remains patent with peroneal runoff.  Discussed his perfusion is as good as it will be at this time.  If his wound fails to heal would need amputation.  Will have him follow-up again in 3 months but discussed if there are concerns from his podiatrist about wound healing we can see him earlier.  Will repeat right leg arterial duplex and ABI's at that time.   [name], MD [facility] Office: [phone] Pager: [phone]

## 2018-10-07 ENCOUNTER — Encounter: Payer: Medicare Other | Admitting: Podiatry

## 2018-10-12 ENCOUNTER — Ambulatory Visit (INDEPENDENT_AMBULATORY_CARE_PROVIDER_SITE_OTHER): Payer: Medicare Other | Admitting: Podiatry

## 2018-10-12 ENCOUNTER — Encounter: Payer: Self-pay | Admitting: Podiatry

## 2018-10-12 DIAGNOSIS — E1151 Type 2 diabetes mellitus with diabetic peripheral angiopathy without gangrene: Secondary | ICD-10-CM | POA: Diagnosis not present

## 2018-10-12 DIAGNOSIS — B351 Tinea unguium: Secondary | ICD-10-CM

## 2018-10-12 NOTE — Patient Instructions (Signed)

## 2018-10-14 ENCOUNTER — Ambulatory Visit (INDEPENDENT_AMBULATORY_CARE_PROVIDER_SITE_OTHER): Payer: Medicare Other | Admitting: Podiatry

## 2018-10-14 ENCOUNTER — Encounter: Payer: Self-pay | Admitting: Podiatry

## 2018-10-14 DIAGNOSIS — L97511 Non-pressure chronic ulcer of other part of right foot limited to breakdown of skin: Secondary | ICD-10-CM

## 2018-10-14 DIAGNOSIS — E08621 Diabetes mellitus due to underlying condition with foot ulcer: Secondary | ICD-10-CM

## 2018-10-14 NOTE — Progress Notes (Signed)
Subjective:  Patient ID: Timothy Faulkner, male    DOB: 06/25/1946,  MRN: [id number]  Chief Complaint  Patient presents with  . Wound Check    R-follow up; "doing alright; no concerns"   DOS: 07/15/18 Procedure:  1) Incision Bone Cortex for Osteomyelitis             2) Bone Biopsy Distal Right 5th Metatarsal             3) Metatarsectomy              4) Bone Biopsy Proximal Right 5th Metatarsal             5) Application of Wound VAC  DOS: 07/17/18 Procedure:  1) Bone Biopsy Left 5th Phalanx             2) Delayed Wound Closure - Complex             3) Filleted Toe Flap  DOS: 09/07/18 Procedure:  1) Debridement of Skin, Subcutaneous Tissue, and Bone             2) Partial 5th Metatarsectomy             3) Application of Wound VAC  73 y.o. male returns for wound care. Getting HHC. Thinks the wound is doing better.  Review of Systems: Negative except as noted in the HPI. Denies N/V/F/Ch.  Past Medical History:  Diagnosis Date  . Anemia   . Arthritis    Gout  . Automatic implantable cardioverter-defibrillator in situ    [employer]  . CHF   . ESRD on dialysis [facility])    [name] 03/11/2013 (03/11/2013) dialysis M/W/F  . GERD (gastroesophageal reflux disease)   . Gout    "once a year"  . Heart murmur   . HYPERCHOLESTEROLEMIA, MIXED   . HYPERTENSION   . Macular degeneration   . Osteomyelitis ([place])    right foot  . 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 Pacemaker   . Peripheral arterial disease (HCC)    left fifth toe ulcer, healing  . Pneumonia   . Shortness of breath   . 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 Wears dentures   . Wears glasses     Current Outpatient Medications:  .  aspirin EC 81 MG tablet, Take 81 mg by mouth daily., Disp: , Rfl:  .  AURYXIA 1 GM 210 MG(Fe) tablet, Take 420 mg by mouth 3 (three) times daily with meals. , Disp: , Rfl:  .  calcitRIOL (ROCALTROL) 0.5 MCG  capsule, Take 2 capsules (1 mcg total) by mouth 3 (three) times a week., Disp: 60 capsule, Rfl: 0 .  clindamycin (CLEOCIN) 150 MG capsule, Take 1 capsule (150 mg total) by mouth 2 (two) times daily., Disp: 14 capsule, Rfl: 0 .  clopidogrel (PLAVIX) 75 MG tablet, Take 1 tablet (75 mg total) by mouth daily with breakfast., Disp: 30 tablet, Rfl: 0 .  colchicine 0.6 MG tablet, Take 0.6 mg by mouth daily as needed (for gout flare ups). , Disp: , Rfl: 1 .  collagenase (SANTYL) ointment, Apply 1 application topically daily. Apply to right foot wound daily cover with dry dressing., Disp: 15 g, Rfl: 5 .  collagenase (SANTYL) ointment, Apply 1 application topically daily., Disp: 15 g, Rfl: 5 .  gabapentin (NEURONTIN) 100 MG capsule, TAKE 1 CAPSULE (100 MG TOTAL) BY MOUTH AT BEDTIME., Disp: 90 capsule, Rfl: 2 .  lactulose, encephalopathy, (CHRONULAC) 10 GM/15ML SOLN, Take 20 g by mouth daily as needed (for constipation). , Disp: , Rfl:  .  lidocaine-prilocaine (EMLA) cream, Apply 1 application topically See admin instructions. Apply small amount to access site 1 to 2 hours before dialysis., Disp: , Rfl: 12 .  midodrine (PROAMATINE) 10 MG tablet, Take 10 mg by mouth 3 (three) times a week. , Disp: , Rfl: 6 .  multivitamin (RENA-VIT) TABS tablet, Take 1 tablet by mouth at bedtime., Disp: 30 tablet, Rfl: 0 .  oxyCODONE (ROXICODONE) 5 MG immediate release tablet, Take 1 tablet (5 mg total) by mouth every 8 (eight) hours as needed for severe pain., Disp: 12 tablet, Rfl: 0 .  pantoprazole (PROTONIX) 40 MG tablet, Take 1 tablet (40 mg total) by mouth daily at 6 (six) AM. (Patient taking differently: Take 40 mg by mouth daily as needed (for acid reflux). ), Disp: 30 tablet, Rfl: 0 .  SENSIPAR 30 MG tablet, Take 30 mg by mouth every other day. , Disp: , Rfl:   Social History   Tobacco Use  Smoking Status Former Smoker  . Packs/day: 0.75  . Years: 30.00  . Pack years: 22.50  . Types: Cigarettes  . Last attempt to  quit: 11/25/1992  . Years since quitting: 25.9  Smokeless Tobacco Never Used    No Known Allergies Objective:   There were no vitals filed for this visit. There is no height or weight on file to calculate BMI. Constitutional Well developed. Well nourished.  Vascular Foot warm and well perfused. Capillary refill normal to all digits.   Neurologic Normal speech. Oriented to person, place, and time. Epicritic sensation to light touch grossly present bilaterally.  Dermatologic Wound 80% granular. 8x3.5 no warmth erythema signs of acute infection.       Orthopedic: Tenderness to palpation noted about the surgical site. 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   Radiographs: None.  Results for orders placed or performed during the hospital encounter of 09/07/18  Aerobic/Anaerobic Culture (surgical/deep wound)     Status: None   Collection Time: 09/07/18  7:58 AM  Result Value Ref Range Status   Specimen Description BONE  Final   Special Requests RIGHT 5TH META  Final   Gram Stain   Final    NO WBC SEEN NO ORGANISMS SEEN Performed at [facility], [address], [place], [place] [address]    Culture No growth aerobically or anaerobically.  Final   Report Status 09/12/2018 FINAL  Final    Assessment:   1. Diabetic ulcer of other part of right foot associated with diabetes mellitus due to underlying condition, limited to breakdown of skin [facility])    Plan:  Patient was evaluated and treated and all questions answered.  S/p Right foot debridement -Wound cauterized with silver nitrate to promote epithelialization. -Dressed with DSD. -Continue santyl WTD tomorrow.  Return in about 1 week (around 10/21/2018) for Post-op, Wound Care.

## 2018-10-18 ENCOUNTER — Encounter: Payer: Self-pay | Admitting: Podiatry

## 2018-10-18 NOTE — Progress Notes (Signed)
Subjective: Timothy Faulkner is a 73 y.o. y.o. male who presents for preventative foot care today with h/o NIDDM and PAD.  Pt is s/p 5th digit/metatarsal resection right foot by [name] and is receiving wound care for healing postoperative wound. He is utilizing Santyl wet to dry dressings.  His daughter is present during the visit. He denies any fever, chills, nightsweats, nausea or vomiting.  Patient has left 3rd digit where he says a blister developed and he peeled the roof off of the blister causing a superficial wound.    [name], MD is his PCP.   Current Outpatient Medications:  .  aspirin EC 81 MG tablet, Take 81 mg by mouth daily., Disp: , Rfl:  .  AURYXIA 1 GM 210 MG(Fe) tablet, Take 420 mg by mouth 3 (three) times daily with meals. , Disp: , Rfl:  .  calcitRIOL (ROCALTROL) 0.5 MCG capsule, Take 2 capsules (1 mcg total) by mouth 3 (three) times a week., Disp: 60 capsule, Rfl: 0 .  clindamycin (CLEOCIN) 150 MG capsule, Take 1 capsule (150 mg total) by mouth 2 (two) times daily., Disp: 14 capsule, Rfl: 0 .  clopidogrel (PLAVIX) 75 MG tablet, Take 1 tablet (75 mg total) by mouth daily with breakfast., Disp: 30 tablet, Rfl: 0 .  colchicine 0.6 MG tablet, Take 0.6 mg by mouth daily as needed (for gout flare ups). , Disp: , Rfl: 1 .  collagenase (SANTYL) ointment, Apply 1 application topically daily. Apply to right foot wound daily cover with dry dressing., Disp: 15 g, Rfl: 5 .  collagenase (SANTYL) ointment, Apply 1 application topically daily., Disp: 15 g, Rfl: 5 .  gabapentin (NEURONTIN) 100 MG capsule, TAKE 1 CAPSULE (100 MG TOTAL) BY MOUTH AT BEDTIME., Disp: 90 capsule, Rfl: 2 .  lactulose, encephalopathy, (CHRONULAC) 10 GM/15ML SOLN, Take 20 g by mouth daily as needed (for constipation). , Disp: , Rfl:  .  lidocaine-prilocaine (EMLA) cream, Apply 1 application topically See admin instructions. Apply small amount to access site 1 to 2 hours before dialysis., Disp: , Rfl: 12 .   midodrine (PROAMATINE) 10 MG tablet, Take 10 mg by mouth 3 (three) times a week. , Disp: , Rfl: 6 .  multivitamin (RENA-VIT) TABS tablet, Take 1 tablet by mouth at bedtime., Disp: 30 tablet, Rfl: 0 .  oxyCODONE (ROXICODONE) 5 MG immediate release tablet, Take 1 tablet (5 mg total) by mouth every 8 (eight) hours as needed for severe pain., Disp: 12 tablet, Rfl: 0 .  pantoprazole (PROTONIX) 40 MG tablet, Take 1 tablet (40 mg total) by mouth daily at 6 (six) AM. (Patient taking differently: Take 40 mg by mouth daily as needed (for acid reflux). ), Disp: 30 tablet, Rfl: 0 .  SENSIPAR 30 MG tablet, Take 30 mg by mouth every other day. , Disp: , Rfl:   No Known Allergies  Objective: Vascular Examination: Capillary refill time <3 seconds x 10 digits  Dorsalis pedis pulses nonpalpable b/l  Posterior tibial pulses nonpalpable b/l  No digital hair x 10 digits  Skin temperature gradient warm to warm b/l.  Dermatological Examination: Skin thin, shiny and atrophic b/l  Toenails 1-5 left, 1-4 right discolored, thick, dystrophic with subungual debris and pain with palpation to nailbeds due to thickness of nails.  Dressing right foot clean, dry and intact.  Superficial wound dorsal aspect left 3rd digit with eschar. Stable with no drainage. No ischemia.  Musculoskeletal: Muscle strength 5/5 to all LE muscle groups  Neurological: Sensation  diminished with 10 gram monofilament  Assessment: 1. Painful onychomycosis toenails 1-5 b/l 2. NIDDM with PAD 3. 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 Continue diabetic foot care principles. Literature dispensed. 2. Toenails 1-5 b/l were debrided in length and girth without iatrogenic bleeding. 3. Patient to continue soft, supportive shoe gear left foot. Continue surgical shoe right foot daily 4. He will see [name] on this Thursday for wound check right foot. 5. Patient to report any pedal injuries to medical professional   6. Follow up 3 months.  7. Patient/POA to call should there be a concern in the interim.

## 2018-10-21 ENCOUNTER — Ambulatory Visit (INDEPENDENT_AMBULATORY_CARE_PROVIDER_SITE_OTHER): Payer: Medicare Other | Admitting: Podiatry

## 2018-10-21 ENCOUNTER — Other Ambulatory Visit: Payer: Self-pay

## 2018-10-21 DIAGNOSIS — L97511 Non-pressure chronic ulcer of other part of right foot limited to breakdown of skin: Secondary | ICD-10-CM

## 2018-10-21 DIAGNOSIS — E08621 Diabetes mellitus due to underlying condition with foot ulcer: Secondary | ICD-10-CM

## 2018-10-25 ENCOUNTER — Other Ambulatory Visit: Payer: Self-pay

## 2018-10-25 ENCOUNTER — Ambulatory Visit (INDEPENDENT_AMBULATORY_CARE_PROVIDER_SITE_OTHER): Payer: Medicare Other | Admitting: *Deleted

## 2018-10-25 DIAGNOSIS — I428 Other cardiomyopathies: Secondary | ICD-10-CM

## 2018-10-25 DIAGNOSIS — I5022 Chronic systolic (congestive) heart failure: Secondary | ICD-10-CM

## 2018-10-26 LAB — CUP PACEART REMOTE DEVICE CHECK
Battery Remaining Longevity: 60 mo
Battery Remaining Percentage: 65 %
Date Time Interrogation Session: 20200316053100
HighPow Impedance: 121 Ohm
Implantable Lead Implant Date: 20111118
Implantable Lead Model: 181
Implantable Lead Serial Number: 313758
Implantable Pulse Generator Implant Date: 20111118
Lead Channel Impedance Value: 680 Ohm
Lead Channel Setting Pacing Amplitude: 2.4 V
Lead Channel Setting Pacing Pulse Width: 0.5 ms
MDC IDC LEAD LOCATION: 753860
MDC IDC SET LEADCHNL RV SENSING SENSITIVITY: 0.5 mV
MDC IDC STAT BRADY RV PERCENT PACED: 0 %
Pulse Gen Serial Number: 268731

## 2018-10-27 ENCOUNTER — Other Ambulatory Visit: Payer: Self-pay | Admitting: Neurology

## 2018-11-02 IMAGING — CT CT HEAD W/O CM
3 of 4 series · 14 of 47 positions shown, 16 images · non-contrast
Comparison: None.

CLINICAL DATA: Orthostatic tremor.  Episodes of shaking.

EXAM:
CT HEAD WITHOUT CONTRAST
TECHNIQUE: Contiguous axial images were obtained from the base of the skull
through the vertex without intravenous contrast.

[Series 2: head 5.00 hr40 s3 ax ibhc · axial · 0.35mm/px · z∈[-392,-262]mm · 8 of 32 slices shown, 10 images]
[im 3/32  brain]
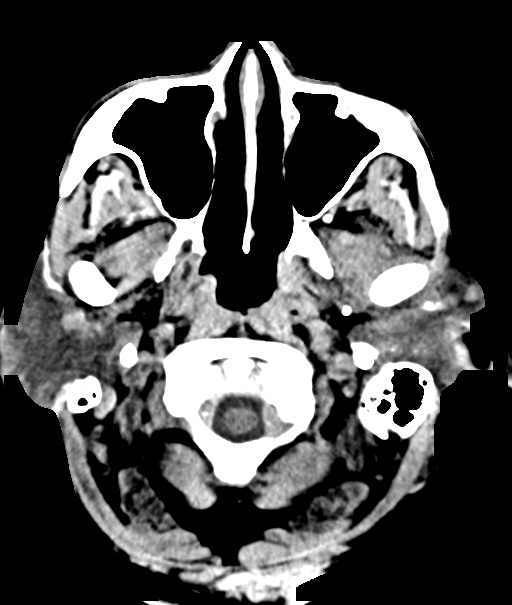
[im 3/32  bone]
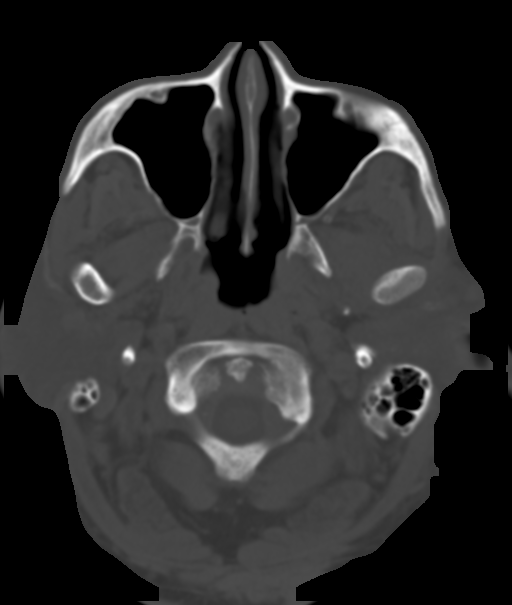
[im 7/32  brain]
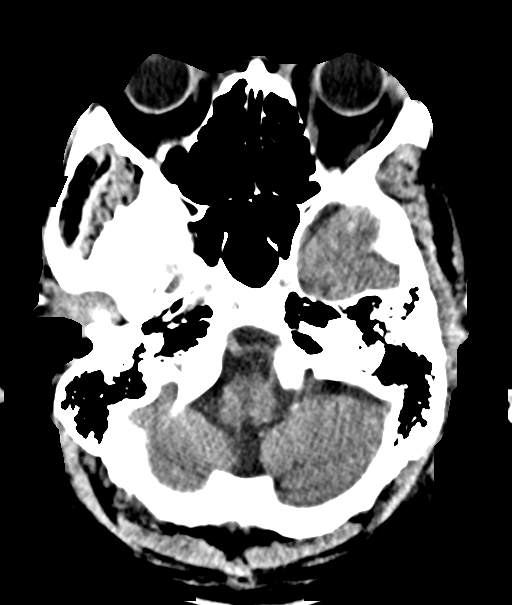
[im 12/32  brain]
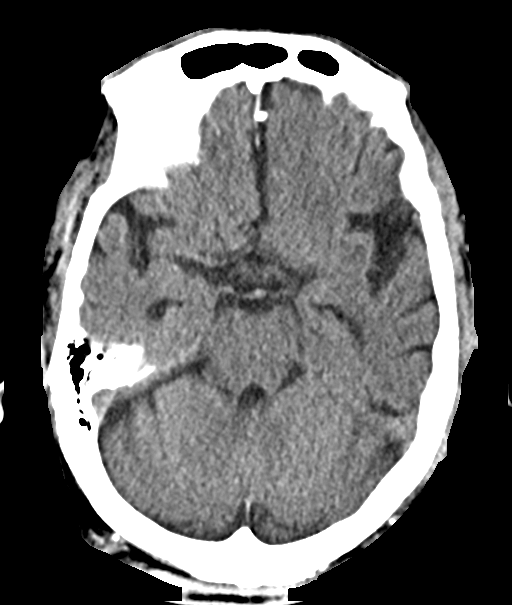
[im 14/32  brain]
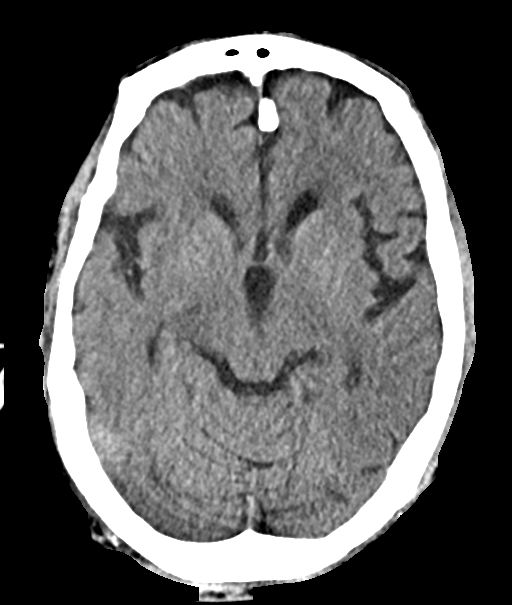
[im 18/32  brain]
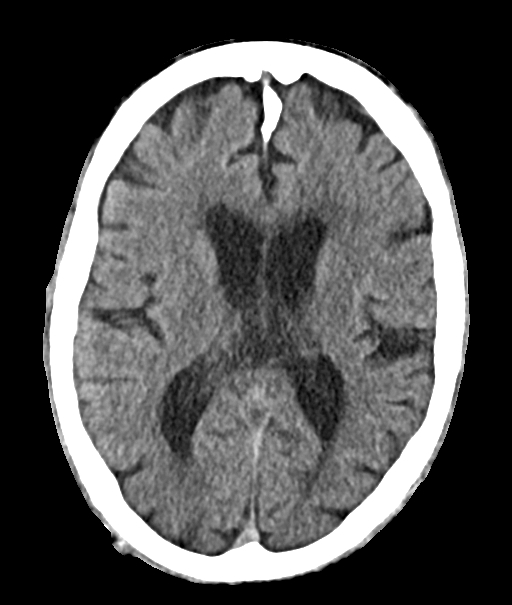
[im 18/32  bone]
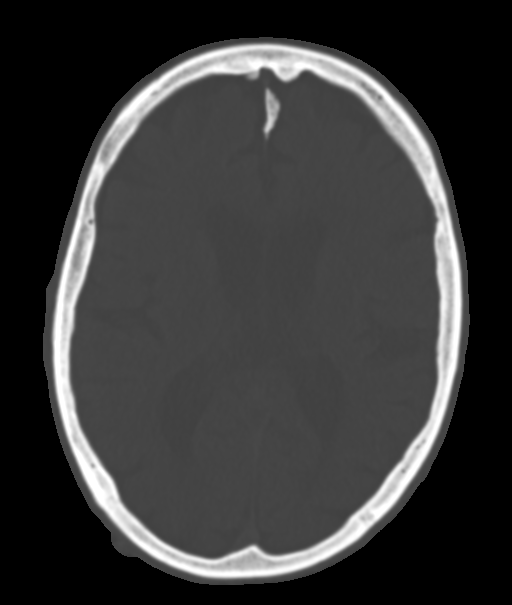
[im 20/32  brain]
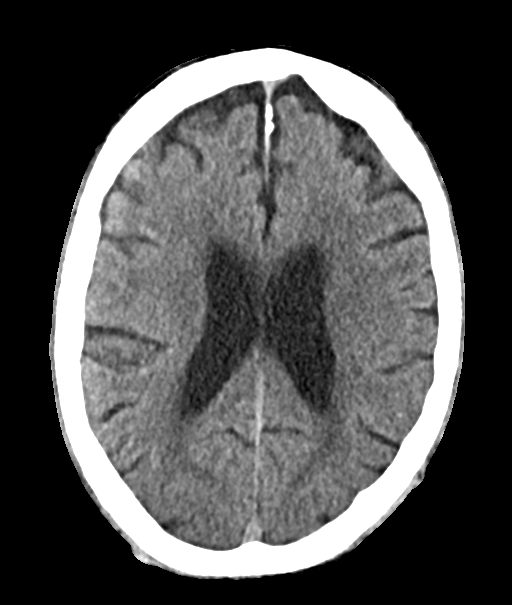
[im 25/32  brain]
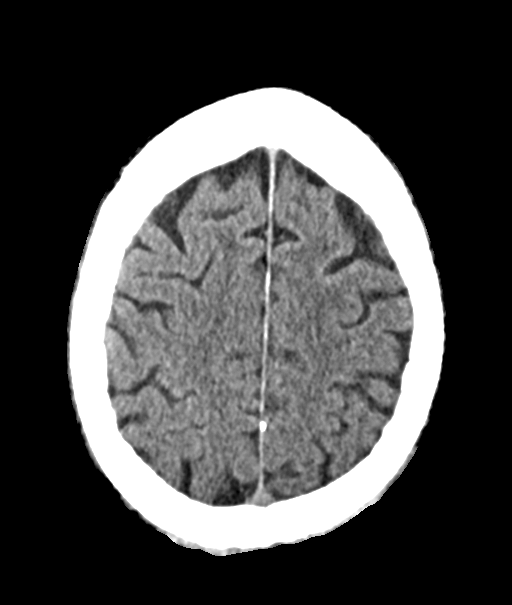
[im 29/32  brain]
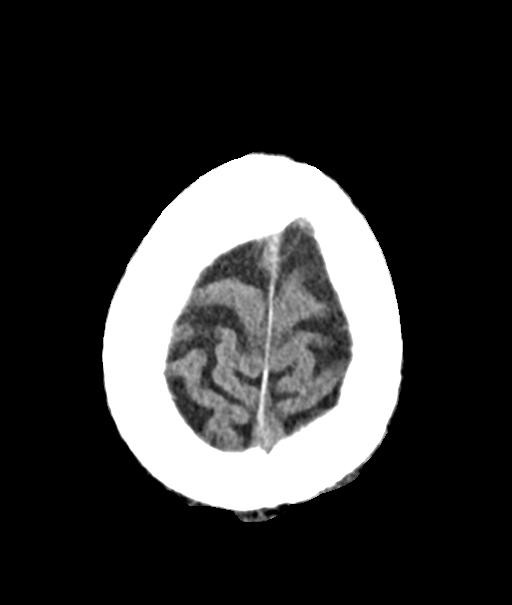

[Series 6: head 3.00 hr40 s3 sag · sagittal · 0.32mm/px · 3 of 59 slices shown]
[im 20/59  brain]
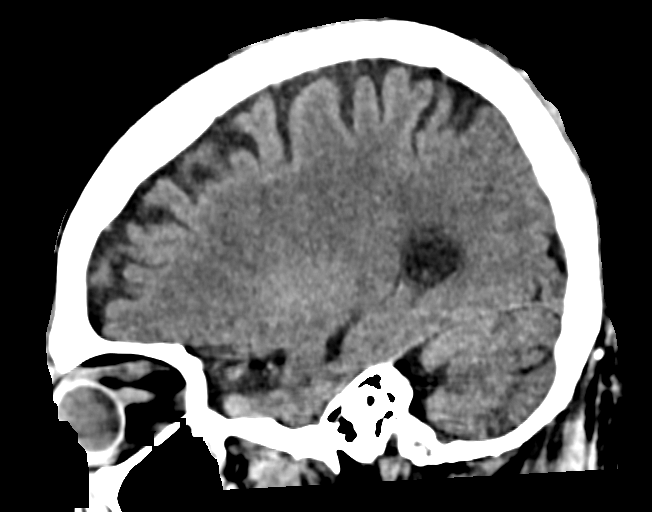
[im 30/59  brain]
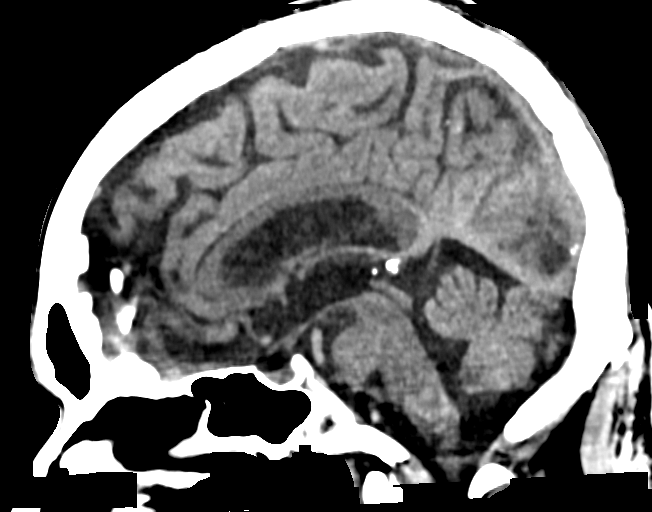
[im 39/59  brain]
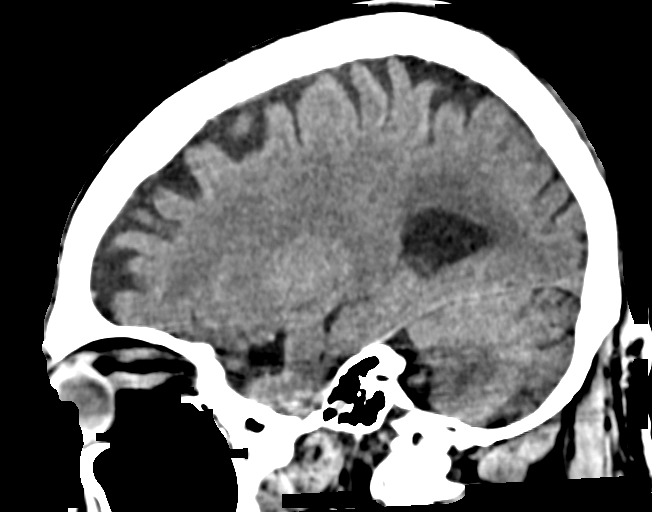

[Series 8: head 3.00 hr40 s3 cor · coronal · 0.34mm/px · 3 of 69 slices shown]
[im 23/69  brain]
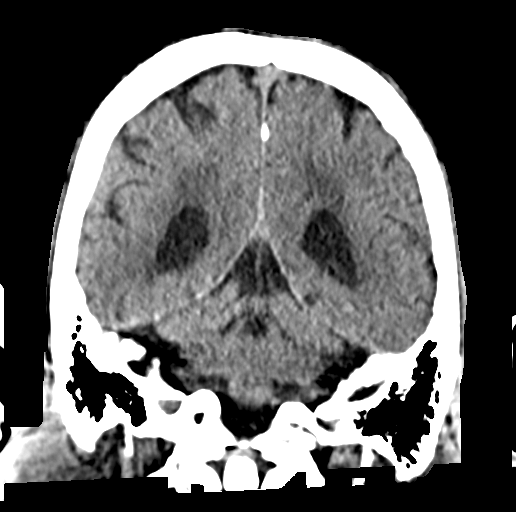
[im 31/69  brain]
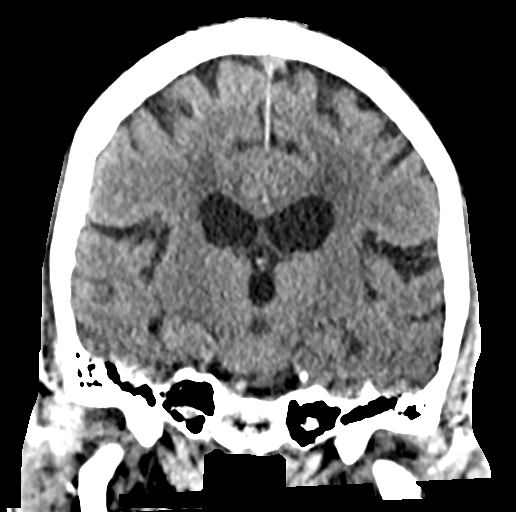
[im 38/69  brain]
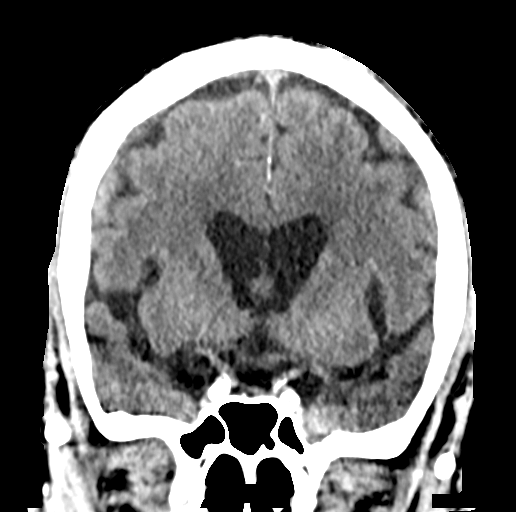

[14 of 47 positions shown; findings below may reference images not displayed]

FINDINGS: Brain: No evidence for acute infarction, hemorrhage, mass lesion,
hydrocephalus, or extra-axial fluid. Generalized atrophy.
Hypoattenuation of white matter, likely small vessel disease.

Vascular: Calcification of the cavernous internal carotid arteries
and distal vertebral arteries consistent with cerebrovascular
atherosclerotic disease. No signs of intracranial large vessel
occlusion.

Skull: Calvarium intact.

Sinuses/Orbits: Clear sinuses.  Negative orbits.

Other: None.
IMPRESSION: Atrophy and small vessel disease.

No acute intracranial findings.

## 2018-11-02 NOTE — Progress Notes (Signed)
Remote ICD transmission.   

## 2018-11-04 ENCOUNTER — Other Ambulatory Visit: Payer: Self-pay

## 2018-11-04 ENCOUNTER — Ambulatory Visit (INDEPENDENT_AMBULATORY_CARE_PROVIDER_SITE_OTHER): Payer: Medicare Other | Admitting: Podiatry

## 2018-11-04 DIAGNOSIS — E08621 Diabetes mellitus due to underlying condition with foot ulcer: Secondary | ICD-10-CM

## 2018-11-04 DIAGNOSIS — L928 Other granulomatous disorders of the skin and subcutaneous tissue: Secondary | ICD-10-CM | POA: Diagnosis not present

## 2018-11-04 DIAGNOSIS — L97511 Non-pressure chronic ulcer of other part of right foot limited to breakdown of skin: Secondary | ICD-10-CM

## 2018-11-08 NOTE — Progress Notes (Signed)
Subjective:  Patient ID: Timothy Faulkner, male    DOB: May 24, 1946,  MRN: [id number]  Chief Complaint  Patient presents with  . Wound Check    DOS 1.28.2020 Wound Debridement. Pt states healing well, no concerns, denies fever/nausea/vomiting/chills.   DOS: 07/15/18 Procedure:  1) Incision Bone Cortex for Osteomyelitis             2) Bone Biopsy Distal Right 5th Metatarsal             3) Metatarsectomy              4) Bone Biopsy Proximal Right 5th Metatarsal             5) Application of Wound VAC  DOS: 07/17/18 Procedure:  1) Bone Biopsy Left 5th Phalanx             2) Delayed Wound Closure - Complex             3) Filleted Toe Flap  DOS: 09/07/18 Procedure:  1) Debridement of Skin, Subcutaneous Tissue, and Bone             2) Partial 5th Metatarsectomy             3) Application of Wound VAC  73 y.o. male returns for wound care. Getting HHC. Thinks the wound is doing better.  Review of Systems: Negative except as noted in the HPI. Denies N/V/F/Ch.  Past Medical History:  Diagnosis Date  . Anemia   . Arthritis    Gout  . Automatic implantable cardioverter-defibrillator in situ    [employer]  . CHF   . ESRD on dialysis [facility])    [name] 03/11/2013 (03/11/2013) dialysis M/W/F  . GERD (gastroesophageal reflux disease)   . Gout    "once a year"  . Heart murmur   . HYPERCHOLESTEROLEMIA, MIXED   . HYPERTENSION   . Macular degeneration   . Osteomyelitis ([place])    right foot  . 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 Pacemaker   . Peripheral arterial disease (HCC)    left fifth toe ulcer, healing  . Pneumonia   . Shortness of breath   . 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 Wears dentures   . Wears glasses     Current Outpatient Medications:  .  aspirin EC 81 MG tablet, Take 81 mg by mouth daily., Disp: , Rfl:  .  AURYXIA 1 GM 210 MG(Fe) tablet, Take 420 mg by mouth 3 (three) times daily with  meals. , Disp: , Rfl:  .  calcitRIOL (ROCALTROL) 0.5 MCG capsule, Take 2 capsules (1 mcg total) by mouth 3 (three) times a week., Disp: 60 capsule, Rfl: 0 .  clindamycin (CLEOCIN) 150 MG capsule, Take 1 capsule (150 mg total) by mouth 2 (two) times daily., Disp: 14 capsule, Rfl: 0 .  clopidogrel (PLAVIX) 75 MG tablet, Take 1 tablet (75 mg total) by mouth daily with breakfast., Disp: 30 tablet, Rfl: 0 .  colchicine 0.6 MG tablet, Take 0.6 mg by mouth daily as needed (for gout flare ups). , Disp: , Rfl: 1 .  collagenase (SANTYL) ointment, Apply 1 application topically daily. Apply to right foot wound daily cover with dry dressing., Disp: 15 g, Rfl: 5 .  collagenase (SANTYL) ointment, Apply 1 application topically daily., Disp: 15 g, Rfl: 5 .  gabapentin (NEURONTIN) 100 MG capsule, TAKE 1 CAPSULE BY MOUTH AT BEDTIME., Disp: 90 capsule, Rfl:  0 .  lactulose, encephalopathy, (CHRONULAC) 10 GM/15ML SOLN, Take 20 g by mouth daily as needed (for constipation). , Disp: , Rfl:  .  lidocaine-prilocaine (EMLA) cream, Apply 1 application topically See admin instructions. Apply small amount to access site 1 to 2 hours before dialysis., Disp: , Rfl: 12 .  midodrine (PROAMATINE) 10 MG tablet, Take 10 mg by mouth 3 (three) times a week. , Disp: , Rfl: 6 .  multivitamin (RENA-VIT) TABS tablet, Take 1 tablet by mouth at bedtime., Disp: 30 tablet, Rfl: 0 .  oxyCODONE (ROXICODONE) 5 MG immediate release tablet, Take 1 tablet (5 mg total) by mouth every 8 (eight) hours as needed for severe pain., Disp: 12 tablet, Rfl: 0 .  pantoprazole (PROTONIX) 40 MG tablet, Take 1 tablet (40 mg total) by mouth daily at 6 (six) AM. (Patient taking differently: Take 40 mg by mouth daily as needed (for acid reflux). ), Disp: 30 tablet, Rfl: 0 .  SENSIPAR 30 MG tablet, Take 30 mg by mouth every other day. , Disp: , Rfl:   Social History   Tobacco Use  Smoking Status Former Smoker  . Packs/day: 0.75  . Years: 30.00  . Pack years: 22.50   . Types: Cigarettes  . Last attempt to quit: 11/25/1992  . Years since quitting: 25.9  Smokeless Tobacco Never Used    No Known Allergies Objective:   There were no vitals filed for this visit. There is no height or weight on file to calculate BMI. Constitutional Well developed. Well nourished.  Vascular Foot warm and well perfused. Capillary refill normal to all digits.   Neurologic Normal speech. Oriented to person, place, and time. Epicritic sensation to light touch grossly present bilaterally.  Dermatologic  wound 7.5 x 3 cm granular with minimal fibrosis minimal hyperkeratosis   Orthopedic: Tenderness to palpation noted about the surgical site. 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   Radiographs: None.  Results for orders placed or performed during the hospital encounter of 09/07/18  Aerobic/Anaerobic Culture (surgical/deep wound)     Status: None   Collection Time: 09/07/18  7:58 AM  Result Value Ref Range Status   Specimen Description BONE  Final   Special Requests RIGHT 5TH META  Final   Gram Stain   Final    NO WBC SEEN NO ORGANISMS SEEN Performed at [facility], [address] [place], [place] [address]    Culture No growth aerobically or anaerobically.  Final   Report Status 09/12/2018 FINAL  Final    Assessment:   1. Other granulomatous disorders of the skin and subcutaneous tissue   2. Diabetic ulcer of other part of right foot associated with diabetes mellitus due to underlying condition, limited to breakdown of skin [facility])    Plan:  Patient was evaluated and treated and all questions answered.  S/p Right foot debridement -Wound cauterized with silver nitrate to promote epithelialization  Procedure: Chemical Cauterization of Granulation Tissue Rationale: Cauterize granular wound base to promote healing.  Wound Measurements: 7.5 cm x 3 cm x 0.2 cm  Instrumentation: Silver nitrate stick x4 Dressing: Dry, sterile, compression  dressing. Disposition: Patient tolerated procedure well. Patient to return in 1 week for follow-up.   Return in about 2 weeks (around 11/18/2018) for wound check post-op.

## 2018-11-15 ENCOUNTER — Other Ambulatory Visit: Payer: Self-pay | Admitting: Podiatry

## 2018-11-18 ENCOUNTER — Telehealth: Payer: Self-pay | Admitting: *Deleted

## 2018-11-18 ENCOUNTER — Other Ambulatory Visit: Payer: Self-pay

## 2018-11-18 ENCOUNTER — Ambulatory Visit (INDEPENDENT_AMBULATORY_CARE_PROVIDER_SITE_OTHER): Payer: Medicare Other | Admitting: Podiatry

## 2018-11-18 VITALS — Temp 97.5°F

## 2018-11-18 DIAGNOSIS — L97511 Non-pressure chronic ulcer of other part of right foot limited to breakdown of skin: Secondary | ICD-10-CM

## 2018-11-18 DIAGNOSIS — E08621 Diabetes mellitus due to underlying condition with foot ulcer: Secondary | ICD-10-CM

## 2018-11-18 DIAGNOSIS — Z9889 Other specified postprocedural states: Secondary | ICD-10-CM

## 2018-11-18 DIAGNOSIS — E1151 Type 2 diabetes mellitus with diabetic peripheral angiopathy without gangrene: Secondary | ICD-10-CM

## 2018-11-18 NOTE — Telephone Encounter (Signed)
[name] ordered hydrogel with silver, with sterile gauze 4x4, 2" tape for daily dressing changes to right foot ulcer E08.621, L97.511 measuring 7.5 x 3.0 x 0.2cm with low exudate from [facility]. Faxed orders, demographics to [facility].

## 2018-11-18 NOTE — Progress Notes (Signed)
Subjective:  Patient ID: Timothy Faulkner, male    DOB: 03-21-1946,  MRN: [id number]  Chief Complaint  Patient presents with  . Wound Check    Rt foot: " I have been doing ok,"    DOS: 07/15/18 Procedure:  1) Incision Bone Cortex for Osteomyelitis             2) Bone Biopsy Distal Right 5th Metatarsal             3) Metatarsectomy              4) Bone Biopsy Proximal Right 5th Metatarsal             5) Application of Wound VAC  DOS: 07/17/18 Procedure:  1) Bone Biopsy Left 5th Phalanx             2) Delayed Wound Closure - Complex             3) Filleted Toe Flap  DOS: 09/07/18 Procedure:  1) Debridement of Skin, Subcutaneous Tissue, and Bone             2) Partial 5th Metatarsectomy             3) Application of Wound VAC  73 y.o. male returns for wound care. Needs refill of dresing supplies.  Review of Systems: Negative except as noted in the HPI. Denies N/V/F/Ch.  Past Medical History:  Diagnosis Date  . Anemia   . Arthritis    Gout  . Automatic implantable cardioverter-defibrillator in situ    [employer]  . CHF   . ESRD on dialysis [facility])    [name] 03/11/2013 (03/11/2013) dialysis M/W/F  . GERD (gastroesophageal reflux disease)   . Gout    "once a year"  . Heart murmur   . HYPERCHOLESTEROLEMIA, MIXED   . HYPERTENSION   . Macular degeneration   . Osteomyelitis ([place])    right foot  . 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 Pacemaker   . Peripheral arterial disease (HCC)    left fifth toe ulcer, healing  . Pneumonia   . Shortness of breath   . 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 Wears dentures   . Wears glasses     Current Outpatient Medications:  .  aspirin EC 81 MG tablet, Take 81 mg by mouth daily., Disp: , Rfl:  .  AURYXIA 1 GM 210 MG(Fe) tablet, Take 420 mg by mouth 3 (three) times daily with meals. , Disp: , Rfl:  .  calcitRIOL (ROCALTROL) 0.5 MCG capsule, Take 2 capsules (1  mcg total) by mouth 3 (three) times a week., Disp: 60 capsule, Rfl: 0 .  clindamycin (CLEOCIN) 150 MG capsule, Take 1 capsule (150 mg total) by mouth 2 (two) times daily., Disp: 14 capsule, Rfl: 0 .  clopidogrel (PLAVIX) 75 MG tablet, Take 1 tablet (75 mg total) by mouth daily with breakfast., Disp: 30 tablet, Rfl: 0 .  colchicine 0.6 MG tablet, Take 0.6 mg by mouth daily as needed (for gout flare ups). , Disp: , Rfl: 1 .  collagenase (SANTYL) ointment, Apply 1 application topically daily. Apply to right foot wound daily cover with dry dressing., Disp: 15 g, Rfl: 5 .  collagenase (SANTYL) ointment, Apply 1 application topically daily., Disp: 15 g, Rfl: 5 .  gabapentin (NEURONTIN) 100 MG capsule, TAKE 1 CAPSULE BY MOUTH AT BEDTIME., Disp: 90 capsule, Rfl: 0 .  lactulose, encephalopathy, (CHRONULAC)  10 GM/15ML SOLN, Take 20 g by mouth daily as needed (for constipation). , Disp: , Rfl:  .  lidocaine-prilocaine (EMLA) cream, Apply 1 application topically See admin instructions. Apply small amount to access site 1 to 2 hours before dialysis., Disp: , Rfl: 12 .  midodrine (PROAMATINE) 10 MG tablet, Take 10 mg by mouth 3 (three) times a week. , Disp: , Rfl: 6 .  multivitamin (RENA-VIT) TABS tablet, Take 1 tablet by mouth at bedtime., Disp: 30 tablet, Rfl: 0 .  oxyCODONE (ROXICODONE) 5 MG immediate release tablet, Take 1 tablet (5 mg total) by mouth every 8 (eight) hours as needed for severe pain., Disp: 12 tablet, Rfl: 0 .  pantoprazole (PROTONIX) 40 MG tablet, Take 1 tablet (40 mg total) by mouth daily at 6 (six) AM. (Patient taking differently: Take 40 mg by mouth daily as needed (for acid reflux). ), Disp: 30 tablet, Rfl: 0 .  SANTYL ointment, APPLY TOPICALLY TO RIGHT FOOT WOUND DAILY, COVER WITH DRY DRESSING, Disp: 30 g, Rfl: 0 .  SENSIPAR 30 MG tablet, Take 30 mg by mouth every other day. , Disp: , Rfl:   Social History   Tobacco Use  Smoking Status Former Smoker  . Packs/day: 0.75  . Years: 30.00   . Pack years: 22.50  . Types: Cigarettes  . Last attempt to quit: 11/25/1992  . Years since quitting: 25.9  Smokeless Tobacco Never Used    No Known Allergies Objective:   Vitals:   11/18/18 0910  Temp: (!) 97.5 F (36.4 C)   There is no height or weight on file to calculate BMI. Constitutional Well developed. Well nourished.  Vascular Foot warm and well perfused. Capillary refill normal to all digits.   Neurologic Normal speech. Oriented to person, place, and time. Epicritic sensation to light touch grossly present bilaterally.  Dermatologic Wound right lateral 7.5 x 3x0.2 mostly granular   Orthopedic: Tenderness to palpation noted about the surgical site. 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   Radiographs: None.  Results for orders placed or performed during the hospital encounter of 09/07/18  Aerobic/Anaerobic Culture (surgical/deep wound)     Status: None   Collection Time: 09/07/18  7:58 AM  Result Value Ref Range Status   Specimen Description BONE  Final   Special Requests RIGHT 5TH META  Final   Gram Stain   Final    NO WBC SEEN NO ORGANISMS SEEN Performed at [facility], [address], [place], [place] [address]    Culture No growth aerobically or anaerobically.  Final   Report Status 09/12/2018 FINAL  Final    Assessment:   1. Diabetic ulcer of other part of right foot associated with diabetes mellitus due to underlying condition, limited to breakdown of skin ([place])   2. Type II diabetes mellitus with peripheral circulatory disorder (HCC)   3. Post-operative state    Plan:  Patient was evaluated and treated and all questions answered.  S/p Right foot debridement -Wound cauterized with silver nitrate to promote epithelialization  Procedure: Chemical Cauterization of Granulation Tissue Rationale: Cauterize granular wound base to promote healing.  Wound Measurements: 7.5 cm x 3 cm x 0.2 cm  Instrumentation: Silver nitrate stick x2  Dressing: Dry, sterile, compression dressing. Disposition: Patient tolerated procedure well. Patient to return in 1 week for follow-up.    No follow-ups on file.

## 2018-11-23 NOTE — Progress Notes (Signed)
Subjective:  Patient ID: Timothy Faulkner, male    DOB: 16-Apr-1946,  MRN: [id number]  No chief complaint on file.  DOS: 07/15/18 Procedure:  1) Incision Bone Cortex for Osteomyelitis             2) Bone Biopsy Distal Right 5th Metatarsal             3) Metatarsectomy              4) Bone Biopsy Proximal Right 5th Metatarsal             5) Application of Wound VAC  DOS: 07/17/18 Procedure:  1) Bone Biopsy Left 5th Phalanx             2) Delayed Wound Closure - Complex             3) Filleted Toe Flap  DOS: 09/07/18 Procedure:  1) Debridement of Skin, Subcutaneous Tissue, and Bone             2) Partial 5th Metatarsectomy             3) Application of Wound VAC  73 y.o. male returns for wound care. Getting HHC. Denies complaints.  Review of Systems: Negative except as noted in the HPI. Denies N/V/F/Ch.  Past Medical History:  Diagnosis Date  . Anemia   . Arthritis    Gout  . Automatic implantable cardioverter-defibrillator in situ    [employer]  . CHF   . ESRD on dialysis [facility])    [name] 03/11/2013 (03/11/2013) dialysis M/W/F  . GERD (gastroesophageal reflux disease)   . Gout    "once a year"  . Heart murmur   . HYPERCHOLESTEROLEMIA, MIXED   . HYPERTENSION   . Macular degeneration   . Osteomyelitis ([place])    right foot  . 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 Pacemaker   . Peripheral arterial disease (HCC)    left fifth toe ulcer, healing  . Pneumonia   . Shortness of breath   . 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 Wears dentures   . Wears glasses     Current Outpatient Medications:  .  aspirin EC 81 MG tablet, Take 81 mg by mouth daily., Disp: , Rfl:  .  AURYXIA 1 GM 210 MG(Fe) tablet, Take 420 mg by mouth 3 (three) times daily with meals. , Disp: , Rfl:  .  calcitRIOL (ROCALTROL) 0.5 MCG capsule, Take 2 capsules (1 mcg total) by mouth 3 (three) times a week., Disp: 60 capsule, Rfl: 0  .  clindamycin (CLEOCIN) 150 MG capsule, Take 1 capsule (150 mg total) by mouth 2 (two) times daily., Disp: 14 capsule, Rfl: 0 .  clopidogrel (PLAVIX) 75 MG tablet, Take 1 tablet (75 mg total) by mouth daily with breakfast., Disp: 30 tablet, Rfl: 0 .  colchicine 0.6 MG tablet, Take 0.6 mg by mouth daily as needed (for gout flare ups). , Disp: , Rfl: 1 .  collagenase (SANTYL) ointment, Apply 1 application topically daily. Apply to right foot wound daily cover with dry dressing., Disp: 15 g, Rfl: 5 .  collagenase (SANTYL) ointment, Apply 1 application topically daily., Disp: 15 g, Rfl: 5 .  gabapentin (NEURONTIN) 100 MG capsule, TAKE 1 CAPSULE BY MOUTH AT BEDTIME., Disp: 90 capsule, Rfl: 0 .  lactulose, encephalopathy, (CHRONULAC) 10 GM/15ML SOLN, Take 20 g by mouth daily as needed (for constipation). , Disp: , Rfl:  .  lidocaine-prilocaine (EMLA) cream, Apply 1 application topically See admin instructions. Apply small amount to access site 1 to 2 hours before dialysis., Disp: , Rfl: 12 .  midodrine (PROAMATINE) 10 MG tablet, Take 10 mg by mouth 3 (three) times a week. , Disp: , Rfl: 6 .  multivitamin (RENA-VIT) TABS tablet, Take 1 tablet by mouth at bedtime., Disp: 30 tablet, Rfl: 0 .  oxyCODONE (ROXICODONE) 5 MG immediate release tablet, Take 1 tablet (5 mg total) by mouth every 8 (eight) hours as needed for severe pain., Disp: 12 tablet, Rfl: 0 .  pantoprazole (PROTONIX) 40 MG tablet, Take 1 tablet (40 mg total) by mouth daily at 6 (six) AM. (Patient taking differently: Take 40 mg by mouth daily as needed (for acid reflux). ), Disp: 30 tablet, Rfl: 0 .  SANTYL ointment, APPLY TOPICALLY TO RIGHT FOOT WOUND DAILY, COVER WITH DRY DRESSING, Disp: 30 g, Rfl: 0 .  SENSIPAR 30 MG tablet, Take 30 mg by mouth every other day. , Disp: , Rfl:   Social History   Tobacco Use  Smoking Status Former Smoker  . Packs/day: 0.75  . Years: 30.00  . Pack years: 22.50  . Types: Cigarettes  . Last attempt to quit:  11/25/1992  . Years since quitting: 26.0  Smokeless Tobacco Never Used    No Known Allergies Objective:   There were no vitals filed for this visit. There is no height or weight on file to calculate BMI. Constitutional Well developed. Well nourished.  Vascular Foot warm and well perfused. Capillary refill normal to all digits.   Neurologic Normal speech. Oriented to person, place, and time. Epicritic sensation to light touch grossly present bilaterally.  Dermatologic Wound 80% granular. 8x4 no warmth erythema signs of acute infection.         Orthopedic: Tenderness to palpation noted about the surgical site. 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   Radiographs: None.  Results for orders placed or performed during the hospital encounter of 09/07/18  Aerobic/Anaerobic Culture (surgical/deep wound)     Status: None   Collection Time: 09/07/18  7:58 AM  Result Value Ref Range Status   Specimen Description BONE  Final   Special Requests RIGHT 5TH META  Final   Gram Stain   Final    NO WBC SEEN NO ORGANISMS SEEN Performed at [facility], [address] [place], [place] [address]    Culture No growth aerobically or anaerobically.  Final   Report Status 09/12/2018 FINAL  Final    Assessment:   1. 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  S/p Right foot debridement -Wound gently debrided and cauterized with silver nitrate. Covered under global. -Dressed with DSD. -Continue santyl WTD .  No follow-ups on file.

## 2018-11-24 DIAGNOSIS — T829XXA Unspecified complication of cardiac and vascular prosthetic device, implant and graft, initial encounter: Secondary | ICD-10-CM | POA: Insufficient documentation

## 2018-12-02 ENCOUNTER — Other Ambulatory Visit: Payer: Self-pay

## 2018-12-02 ENCOUNTER — Ambulatory Visit (INDEPENDENT_AMBULATORY_CARE_PROVIDER_SITE_OTHER): Payer: Medicare Other | Admitting: Podiatry

## 2018-12-02 VITALS — Temp 97.3°F

## 2018-12-02 DIAGNOSIS — L928 Other granulomatous disorders of the skin and subcutaneous tissue: Secondary | ICD-10-CM | POA: Diagnosis not present

## 2018-12-02 NOTE — Progress Notes (Signed)
Subjective:  Patient ID: Timothy Faulkner, male    DOB: 1946/07/25,  MRN: [id number]  Chief Complaint  Patient presents with  . Wound Check    Rt foot: wound care   DOS: 07/15/18 Procedure:  1) Incision Bone Cortex for Osteomyelitis             2) Bone Biopsy Distal Right 5th Metatarsal             3) Metatarsectomy              4) Bone Biopsy Proximal Right 5th Metatarsal             5) Application of Wound VAC  DOS: 07/17/18 Procedure:  1) Bone Biopsy Left 5th Phalanx             2) Delayed Wound Closure - Complex             3) Filleted Toe Flap  DOS: 09/07/18 Procedure:  1) Debridement of Skin, Subcutaneous Tissue, and Bone             2) Partial 5th Metatarsectomy             3) Application of Wound VAC  73 y.o. male returns for wound care. Denies interval changes. Denies issues.  Review of Systems: Negative except as noted in the HPI. Denies N/V/F/Ch.  Past Medical History:  Diagnosis Date  . Anemia   . Arthritis    Gout  . Automatic implantable cardioverter-defibrillator in situ    [employer]  . CHF   . ESRD on dialysis [facility])    [name] 03/11/2013 (03/11/2013) dialysis M/W/F  . GERD (gastroesophageal reflux disease)   . Gout    "once a year"  . Heart murmur   . HYPERCHOLESTEROLEMIA, MIXED   . HYPERTENSION   . Macular degeneration   . Osteomyelitis ([place])    right foot  . 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 Pacemaker   . Peripheral arterial disease (HCC)    left fifth toe ulcer, healing  . Pneumonia   . Shortness of breath   . 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 Wears dentures   . Wears glasses     Current Outpatient Medications:  .  aspirin EC 81 MG tablet, Take 81 mg by mouth daily., Disp: , Rfl:  .  AURYXIA 1 GM 210 MG(Fe) tablet, Take 420 mg by mouth 3 (three) times daily with meals. , Disp: , Rfl:  .  calcitRIOL (ROCALTROL) 0.5 MCG capsule, Take 2 capsules (1 mcg  total) by mouth 3 (three) times a week., Disp: 60 capsule, Rfl: 0 .  clindamycin (CLEOCIN) 150 MG capsule, Take 1 capsule (150 mg total) by mouth 2 (two) times daily., Disp: 14 capsule, Rfl: 0 .  clopidogrel (PLAVIX) 75 MG tablet, Take 1 tablet (75 mg total) by mouth daily with breakfast., Disp: 30 tablet, Rfl: 0 .  colchicine 0.6 MG tablet, Take 0.6 mg by mouth daily as needed (for gout flare ups). , Disp: , Rfl: 1 .  collagenase (SANTYL) ointment, Apply 1 application topically daily. Apply to right foot wound daily cover with dry dressing., Disp: 15 g, Rfl: 5 .  collagenase (SANTYL) ointment, Apply 1 application topically daily., Disp: 15 g, Rfl: 5 .  gabapentin (NEURONTIN) 100 MG capsule, TAKE 1 CAPSULE BY MOUTH AT BEDTIME., Disp: 90 capsule, Rfl: 0 .  lactulose, encephalopathy, (CHRONULAC) 10 GM/15ML SOLN, Take 20  g by mouth daily as needed (for constipation). , Disp: , Rfl:  .  lidocaine-prilocaine (EMLA) cream, Apply 1 application topically See admin instructions. Apply small amount to access site 1 to 2 hours before dialysis., Disp: , Rfl: 12 .  midodrine (PROAMATINE) 10 MG tablet, Take 10 mg by mouth 3 (three) times a week. , Disp: , Rfl: 6 .  multivitamin (RENA-VIT) TABS tablet, Take 1 tablet by mouth at bedtime., Disp: 30 tablet, Rfl: 0 .  oxyCODONE (ROXICODONE) 5 MG immediate release tablet, Take 1 tablet (5 mg total) by mouth every 8 (eight) hours as needed for severe pain., Disp: 12 tablet, Rfl: 0 .  pantoprazole (PROTONIX) 40 MG tablet, Take 1 tablet (40 mg total) by mouth daily at 6 (six) AM. (Patient taking differently: Take 40 mg by mouth daily as needed (for acid reflux). ), Disp: 30 tablet, Rfl: 0 .  SANTYL ointment, APPLY TOPICALLY TO RIGHT FOOT WOUND DAILY, COVER WITH DRY DRESSING, Disp: 30 g, Rfl: 0 .  SENSIPAR 30 MG tablet, Take 30 mg by mouth every other day. , Disp: , Rfl:   Social History   Tobacco Use  Smoking Status Former Smoker  . Packs/day: 0.75  . Years: 30.00  .  Pack years: 22.50  . Types: Cigarettes  . Last attempt to quit: 11/25/1992  . Years since quitting: 26.0  Smokeless Tobacco Never Used    No Known Allergies Objective:   Vitals:   12/02/18 0834  Temp: (!) 97.3 F (36.3 C)   There is no height or weight on file to calculate BMI. Constitutional Well developed. Well nourished.  Vascular Foot warm and well perfused. Capillary refill normal to all digits.   Neurologic Normal speech. Oriented to person, place, and time. Epicritic sensation to light touch grossly present bilaterally.  Dermatologic Wound right lateral 5x3x0.3 wound base 90% granular, no warmth erythema signs of infection.   Orthopedic: Tenderness to palpation noted about the surgical site. 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   Radiographs: None.  Assessment:   1. Other granulomatous disorders of the skin and subcutaneous tissue    Plan:  Patient was evaluated and treated and all questions answered.  S/p Right foot debridement -Wound cauterized to promote epithelialization. -Dressed with DSD. -Should wound continue to be slow to improve will refer to wound care center at next visit.  Procedure: Chemical Cauterization of Granulation Tissue Rationale: Cauterize granular wound base to promote healing.  Wound Measurements: 5 cm x 3 cm x 0.2 cm  Instrumentation: Silver nitrate stick x3 Dressing: Dry, sterile, compression dressing. Disposition: Patient tolerated procedure well. Patient to return in 1 week for follow-up.  Return in about 2 weeks (around 12/16/2018) for Wound Care.

## 2018-12-08 ENCOUNTER — Other Ambulatory Visit: Payer: Self-pay

## 2018-12-08 DIAGNOSIS — I739 Peripheral vascular disease, unspecified: Secondary | ICD-10-CM

## 2018-12-16 ENCOUNTER — Ambulatory Visit (INDEPENDENT_AMBULATORY_CARE_PROVIDER_SITE_OTHER): Payer: Medicare Other | Admitting: Podiatry

## 2018-12-16 ENCOUNTER — Other Ambulatory Visit: Payer: Self-pay

## 2018-12-16 ENCOUNTER — Telehealth: Payer: Self-pay | Admitting: Podiatry

## 2018-12-16 VITALS — Temp 97.2°F

## 2018-12-16 DIAGNOSIS — L97511 Non-pressure chronic ulcer of other part of right foot limited to breakdown of skin: Secondary | ICD-10-CM | POA: Diagnosis not present

## 2018-12-16 DIAGNOSIS — E08621 Diabetes mellitus due to underlying condition with foot ulcer: Secondary | ICD-10-CM

## 2018-12-16 NOTE — Telephone Encounter (Signed)
I informed pt's dtr, that [name] stated wash the area to remove the cream and cover with a dry sterile dressing.

## 2018-12-16 NOTE — Telephone Encounter (Signed)
Pts daughter called. Pt was seen in office this morning and states that the cream/medication they put on his foot this morning is burning/painful. Please give pts daughter a call back.

## 2018-12-16 NOTE — Progress Notes (Signed)
Subjective:  Patient ID: Timothy Faulkner, male    DOB: 1946-02-02,  MRN: [id number]  Chief Complaint  Patient presents with  . Wound Check    Right foot lateral side wound care. Pt states feels as though it is healing. Pt denies fever/nausea/vomiting/chills.   DOS: 07/15/18 Procedure:  1) Incision Bone Cortex for Osteomyelitis             2) Bone Biopsy Distal Right 5th Metatarsal             3) Metatarsectomy              4) Bone Biopsy Proximal Right 5th Metatarsal             5) Application of Wound VAC  DOS: 07/17/18 Procedure:  1) Bone Biopsy Left 5th Phalanx             2) Delayed Wound Closure - Complex             3) Filleted Toe Flap  DOS: 09/07/18 Procedure:  1) Debridement of Skin, Subcutaneous Tissue, and Bone             2) Partial 5th Metatarsectomy             3) Application of Wound VAC  73 y.o. male returns for wound care. Denies interval changes. Denies issues.  Review of Systems: Negative except as noted in the HPI. Denies N/V/F/Ch.  Past Medical History:  Diagnosis Date  . Anemia   . Arthritis    Gout  . Automatic implantable cardioverter-defibrillator in situ    [employer]  . CHF   . ESRD on dialysis [facility])    [name] 03/11/2013 (03/11/2013) dialysis M/W/F  . GERD (gastroesophageal reflux disease)   . Gout    "once a year"  . Heart murmur   . HYPERCHOLESTEROLEMIA, MIXED   . HYPERTENSION   . Macular degeneration   . Osteomyelitis ([address])    right foot  . 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 Pacemaker   . Peripheral arterial disease (HCC)    left fifth toe ulcer, healing  . Pneumonia   . Shortness of breath   . 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 Wears dentures   . Wears glasses     Current Outpatient Medications:  .  aspirin EC 81 MG tablet, Take 81 mg by mouth daily., Disp: , Rfl:  .  AURYXIA 1 GM 210 MG(Fe) tablet, Take 420 mg by mouth 3 (three) times daily  with meals. , Disp: , Rfl:  .  calcitRIOL (ROCALTROL) 0.5 MCG capsule, Take 2 capsules (1 mcg total) by mouth 3 (three) times a week., Disp: 60 capsule, Rfl: 0 .  clindamycin (CLEOCIN) 150 MG capsule, Take 1 capsule (150 mg total) by mouth 2 (two) times daily., Disp: 14 capsule, Rfl: 0 .  clopidogrel (PLAVIX) 75 MG tablet, Take 1 tablet (75 mg total) by mouth daily with breakfast., Disp: 30 tablet, Rfl: 0 .  colchicine 0.6 MG tablet, Take 0.6 mg by mouth daily as needed (for gout flare ups). , Disp: , Rfl: 1 .  collagenase (SANTYL) ointment, Apply 1 application topically daily. Apply to right foot wound daily cover with dry dressing., Disp: 15 g, Rfl: 5 .  collagenase (SANTYL) ointment, Apply 1 application topically daily., Disp: 15 g, Rfl: 5 .  gabapentin (NEURONTIN) 100 MG capsule, TAKE 1 CAPSULE BY MOUTH AT BEDTIME., Disp: 90  capsule, Rfl: 0 .  lactulose, encephalopathy, (CHRONULAC) 10 GM/15ML SOLN, Take 20 g by mouth daily as needed (for constipation). , Disp: , Rfl:  .  lidocaine-prilocaine (EMLA) cream, Apply 1 application topically See admin instructions. Apply small amount to access site 1 to 2 hours before dialysis., Disp: , Rfl: 12 .  midodrine (PROAMATINE) 10 MG tablet, Take 10 mg by mouth 3 (three) times a week. , Disp: , Rfl: 6 .  multivitamin (RENA-VIT) TABS tablet, Take 1 tablet by mouth at bedtime., Disp: 30 tablet, Rfl: 0 .  oxyCODONE (ROXICODONE) 5 MG immediate release tablet, Take 1 tablet (5 mg total) by mouth every 8 (eight) hours as needed for severe pain., Disp: 12 tablet, Rfl: 0 .  pantoprazole (PROTONIX) 40 MG tablet, Take 1 tablet (40 mg total) by mouth daily at 6 (six) AM. (Patient taking differently: Take 40 mg by mouth daily as needed (for acid reflux). ), Disp: 30 tablet, Rfl: 0 .  SANTYL ointment, APPLY TOPICALLY TO RIGHT FOOT WOUND DAILY, COVER WITH DRY DRESSING, Disp: 30 g, Rfl: 0 .  SENSIPAR 30 MG tablet, Take 30 mg by mouth every other day. , Disp: , Rfl:   Social  History   Tobacco Use  Smoking Status Former Smoker  . Packs/day: 0.75  . Years: 30.00  . Pack years: 22.50  . Types: Cigarettes  . Last attempt to quit: 11/25/1992  . Years since quitting: 26.1  Smokeless Tobacco Never Used    No Known Allergies Objective:   Vitals:   12/16/18 0856  Temp: (!) 97.2 F (36.2 C)   There is no height or weight on file to calculate BMI. Constitutional Well developed. Well nourished.  Vascular Foot warm and well perfused. Capillary refill normal to all digits.   Neurologic Normal speech. Oriented to person, place, and time. Epicritic sensation to light touch grossly present bilaterally.  Dermatologic Wound right lateral 5x3x0.3 wound base 90% granular, no warmth erythema signs of infection.   Orthopedic: Tenderness to palpation noted about the surgical site. 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   Radiographs: None.  Assessment:   1. Diabetic ulcer of other part of right foot associated with diabetes mellitus due to underlying condition, limited to breakdown of skin [facility])    Plan:  Patient was evaluated and treated and all questions answered.  S/p Right foot debridement -Wound with granular base - dressed with wet to dry.  -Should wound continue to be slow to improve will refer to wound care center at next visit or application of wound graft.  No follow-ups on file.

## 2018-12-30 ENCOUNTER — Encounter: Payer: Self-pay | Admitting: Podiatry

## 2018-12-30 ENCOUNTER — Other Ambulatory Visit: Payer: Self-pay

## 2018-12-30 ENCOUNTER — Ambulatory Visit (INDEPENDENT_AMBULATORY_CARE_PROVIDER_SITE_OTHER): Payer: Medicare Other | Admitting: Podiatry

## 2018-12-30 ENCOUNTER — Telehealth: Payer: Self-pay | Admitting: *Deleted

## 2018-12-30 VITALS — Temp 96.6°F

## 2018-12-30 DIAGNOSIS — L97511 Non-pressure chronic ulcer of other part of right foot limited to breakdown of skin: Secondary | ICD-10-CM | POA: Diagnosis not present

## 2018-12-30 DIAGNOSIS — E08621 Diabetes mellitus due to underlying condition with foot ulcer: Secondary | ICD-10-CM | POA: Diagnosis not present

## 2018-12-30 NOTE — Telephone Encounter (Signed)
[name] ordered Hydrogel with silver, ABD Pad, sterile gauze 2 x 2, 4 x 4, and 4" ace wrap for daily application to right lateral foot wound measurement 5 x 3 x 0.2cm with low exudate, E08.621, L97.511, from [facility]. Orders faxed to [facility].

## 2018-12-30 NOTE — Progress Notes (Signed)
Subjective:  Patient ID: Timothy Faulkner, male    DOB: 08-28-1945,  MRN: [id number]  Chief Complaint  Patient presents with  . Diabetes    diabetic foot care f/u pt is here for a f/u on dibetic ulcer on his right foot, pt stated that he is feeling better, and is having minimal pain, pt also stated that the last time we wrapped him it was too tight, and requests that we do it less tight   DOS: 07/15/18 Procedure:  1) Incision Bone Cortex for Osteomyelitis             2) Bone Biopsy Distal Right 5th Metatarsal             3) Metatarsectomy              4) Bone Biopsy Proximal Right 5th Metatarsal             5) Application of Wound VAC  DOS: 07/17/18 Procedure:  1) Bone Biopsy Left 5th Phalanx             2) Delayed Wound Closure - Complex             3) Filleted Toe Flap  DOS: 09/07/18 Procedure:  1) Debridement of Skin, Subcutaneous Tissue, and Bone             2) Partial 5th Metatarsectomy             3) Application of Wound VAC  73 y.o. male returns for wound care. Hx above confirmed with patient.  Review of Systems: Negative except as noted in the HPI. Denies N/V/F/Ch.  Past Medical History:  Diagnosis Date  . Anemia   . Arthritis    Gout  . Automatic implantable cardioverter-defibrillator in situ    [employer]  . CHF   . ESRD on dialysis [facility])    [name] 03/11/2013 (03/11/2013) dialysis M/W/F  . GERD (gastroesophageal reflux disease)   . Gout    "once a year"  . Heart murmur   . HYPERCHOLESTEROLEMIA, MIXED   . HYPERTENSION   . Macular degeneration   . Osteomyelitis ([place])    right foot  . 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 Pacemaker   . Peripheral arterial disease (HCC)    left fifth toe ulcer, healing  . Pneumonia   . Shortness of breath   . 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 Wears dentures   . Wears glasses     Current Outpatient Medications:  .  aspirin EC 81 MG tablet,  Take 81 mg by mouth daily., Disp: , Rfl:  .  AURYXIA 1 GM 210 MG(Fe) tablet, Take 420 mg by mouth 3 (three) times daily with meals. , Disp: , Rfl:  .  calcitRIOL (ROCALTROL) 0.5 MCG capsule, Take 2 capsules (1 mcg total) by mouth 3 (three) times a week., Disp: 60 capsule, Rfl: 0 .  clindamycin (CLEOCIN) 150 MG capsule, Take 1 capsule (150 mg total) by mouth 2 (two) times daily., Disp: 14 capsule, Rfl: 0 .  clopidogrel (PLAVIX) 75 MG tablet, Take 1 tablet (75 mg total) by mouth daily with breakfast., Disp: 30 tablet, Rfl: 0 .  colchicine 0.6 MG tablet, Take 0.6 mg by mouth daily as needed (for gout flare ups). , Disp: , Rfl: 1 .  collagenase (SANTYL) ointment, Apply 1 application topically daily. Apply to right foot wound daily cover with dry dressing., Disp: 15 g,  Rfl: 5 .  collagenase (SANTYL) ointment, Apply 1 application topically daily., Disp: 15 g, Rfl: 5 .  gabapentin (NEURONTIN) 100 MG capsule, TAKE 1 CAPSULE BY MOUTH AT BEDTIME., Disp: 90 capsule, Rfl: 0 .  lactulose, encephalopathy, (CHRONULAC) 10 GM/15ML SOLN, Take 20 g by mouth daily as needed (for constipation). , Disp: , Rfl:  .  lidocaine-prilocaine (EMLA) cream, Apply 1 application topically See admin instructions. Apply small amount to access site 1 to 2 hours before dialysis., Disp: , Rfl: 12 .  midodrine (PROAMATINE) 10 MG tablet, Take 10 mg by mouth 3 (three) times a week. , Disp: , Rfl: 6 .  multivitamin (RENA-VIT) TABS tablet, Take 1 tablet by mouth at bedtime., Disp: 30 tablet, Rfl: 0 .  oxyCODONE (ROXICODONE) 5 MG immediate release tablet, Take 1 tablet (5 mg total) by mouth every 8 (eight) hours as needed for severe pain., Disp: 12 tablet, Rfl: 0 .  pantoprazole (PROTONIX) 40 MG tablet, Take 1 tablet (40 mg total) by mouth daily at 6 (six) AM. (Patient taking differently: Take 40 mg by mouth daily as needed (for acid reflux). ), Disp: 30 tablet, Rfl: 0 .  SANTYL ointment, APPLY TOPICALLY TO RIGHT FOOT WOUND DAILY, COVER WITH  DRY DRESSING, Disp: 30 g, Rfl: 0 .  SENSIPAR 30 MG tablet, Take 30 mg by mouth every other day. , Disp: , Rfl:   Social History   Tobacco Use  Smoking Status Former Smoker  . Packs/day: 0.75  . Years: 30.00  . Pack years: 22.50  . Types: Cigarettes  . Last attempt to quit: 11/25/1992  . Years since quitting: 26.1  Smokeless Tobacco Never Used    No Known Allergies Objective:   Vitals:   12/30/18 1011  Temp: (!) 96.6 F (35.9 C)   There is no height or weight on file to calculate BMI. Constitutional Well developed. Well nourished.  Vascular Foot warm and well perfused. Capillary refill normal to all digits.   Neurologic Normal speech. Oriented to person, place, and time. Epicritic sensation to light touch grossly present bilaterally.  Dermatologic Wound right lateral 5x3 with almost wholly granular wound base    Orthopedic: Tenderness to palpation noted about the surgical site. 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   Radiographs: None.  Assessment:   1. 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  S/p Right foot debridement -Wound continues to improve, albeit slowly.  -Dressed with collagen gel today -Will order wound care supplies for patient. -Will consider wound graft application in the office in the coming weeks due to lack of meaningful progress.  No follow-ups on file.

## 2019-01-04 ENCOUNTER — Ambulatory Visit: Payer: Medicare Other | Admitting: Vascular Surgery

## 2019-01-04 ENCOUNTER — Encounter (HOSPITAL_COMMUNITY): Payer: Medicare Other

## 2019-01-04 ENCOUNTER — Encounter: Payer: Self-pay | Admitting: Family

## 2019-01-11 ENCOUNTER — Encounter: Payer: Self-pay | Admitting: Podiatry

## 2019-01-11 ENCOUNTER — Other Ambulatory Visit: Payer: Self-pay

## 2019-01-11 ENCOUNTER — Ambulatory Visit: Payer: Medicare Other | Admitting: Neurology

## 2019-01-11 ENCOUNTER — Ambulatory Visit (INDEPENDENT_AMBULATORY_CARE_PROVIDER_SITE_OTHER): Payer: Medicare Other | Admitting: Podiatry

## 2019-01-11 VITALS — Temp 97.7°F

## 2019-01-11 DIAGNOSIS — B351 Tinea unguium: Secondary | ICD-10-CM | POA: Diagnosis not present

## 2019-01-11 DIAGNOSIS — Z89431 Acquired absence of right foot: Secondary | ICD-10-CM | POA: Diagnosis not present

## 2019-01-11 DIAGNOSIS — L97522 Non-pressure chronic ulcer of other part of left foot with fat layer exposed: Secondary | ICD-10-CM

## 2019-01-11 DIAGNOSIS — E11621 Type 2 diabetes mellitus with foot ulcer: Secondary | ICD-10-CM

## 2019-01-11 DIAGNOSIS — E1151 Type 2 diabetes mellitus with diabetic peripheral angiopathy without gangrene: Secondary | ICD-10-CM | POA: Diagnosis not present

## 2019-01-11 NOTE — Patient Instructions (Signed)
Diabetes Mellitus and Foot Care  Foot care is an important part of your health, especially when you have diabetes. Diabetes may cause you to have problems because of poor blood flow (circulation) to your feet and legs, which can cause your skin to:   Become thinner and drier.   Break more easily.   Heal more slowly.   Peel and crack.  You may also have nerve damage (neuropathy) in your legs and feet, causing decreased feeling in them. This means that you may not notice minor injuries to your feet that could lead to more serious problems. Noticing and addressing any potential problems early is the best way to prevent future foot problems.  How to care for your feet  Foot hygiene   Wash your feet daily with warm water and mild soap. Do not use hot water. Then, pat your feet and the areas between your toes until they are completely dry. Do not soak your feet as this can dry your skin.   Trim your toenails straight across. Do not dig under them or around the cuticle. File the edges of your nails with an emery board or nail file.   Apply a moisturizing lotion or petroleum jelly to the skin on your feet and to dry, brittle toenails. Use lotion that does not contain alcohol and is unscented. Do not apply lotion between your toes.  Shoes and socks   Wear clean socks or stockings every day. Make sure they are not too tight. Do not wear knee-high stockings since they may decrease blood flow to your legs.   Wear shoes that fit properly and have enough cushioning. Always look in your shoes before you put them on to be sure there are no objects inside.   To break in new shoes, wear them for just a few hours a day. This prevents injuries on your feet.  Wounds, scrapes, corns, and calluses   Check your feet daily for blisters, cuts, bruises, sores, and redness. If you cannot see the bottom of your feet, use a mirror or ask someone for help.   Do not cut corns or calluses or try to remove them with medicine.   If you  find a minor scrape, cut, or break in the skin on your feet, keep it and the skin around it clean and dry. You may clean these areas with mild soap and water. Do not clean the area with peroxide, alcohol, or iodine.   If you have a wound, scrape, corn, or callus on your foot, look at it several times a day to make sure it is healing and not infected. Check for:  ? Redness, swelling, or pain.  ? Fluid or blood.  ? Warmth.  ? Pus or a bad smell.  General instructions   Do not cross your legs. This may decrease blood flow to your feet.   Do not use heating pads or hot water bottles on your feet. They may burn your skin. If you have lost feeling in your feet or legs, you may not know this is happening until it is too late.   Protect your feet from hot and cold by wearing shoes, such as at the beach or on hot pavement.   Schedule a complete foot exam at least once a year (annually) or more often if you have foot problems. If you have foot problems, report any cuts, sores, or bruises to your health care provider immediately.  Contact a health care provider if:     You have a medical condition that increases your risk of infection and you have any cuts, sores, or bruises on your feet.   You have an injury that is not healing.   You have redness on your legs or feet.   You feel burning or tingling in your legs or feet.   You have pain or cramps in your legs and feet.   Your legs or feet are numb.   Your feet always feel cold.   You have pain around a toenail.  Get help right away if:   You have a wound, scrape, corn, or callus on your foot and:  ? You have pain, swelling, or redness that gets worse.  ? You have fluid or blood coming from the wound, scrape, corn, or callus.  ? Your wound, scrape, corn, or callus feels warm to the touch.  ? You have pus or a bad smell coming from the wound, scrape, corn, or callus.  ? You have a fever.  ? You have a red line going up your leg.  Summary   Check your feet every day  for cuts, sores, red spots, swelling, and blisters.   Moisturize feet and legs daily.   Wear shoes that fit properly and have enough cushioning.   If you have foot problems, report any cuts, sores, or bruises to your health care provider immediately.   Schedule a complete foot exam at least once a year (annually) or more often if you have foot problems.  This information is not intended to replace advice given to you by your health care provider. Make sure you discuss any questions you have with your health care provider.  Document Released: 07/25/2000 Document Revised: 09/09/2017 Document Reviewed: 08/29/2016  Elsevier Interactive Patient Education  2019 [employer]

## 2019-01-12 ENCOUNTER — Encounter: Payer: Self-pay | Admitting: Neurology

## 2019-01-13 ENCOUNTER — Ambulatory Visit (INDEPENDENT_AMBULATORY_CARE_PROVIDER_SITE_OTHER): Payer: Medicare Other

## 2019-01-13 ENCOUNTER — Ambulatory Visit (INDEPENDENT_AMBULATORY_CARE_PROVIDER_SITE_OTHER): Payer: Medicare Other | Admitting: Podiatry

## 2019-01-13 ENCOUNTER — Other Ambulatory Visit: Payer: Self-pay

## 2019-01-13 VITALS — Temp 98.9°F

## 2019-01-13 DIAGNOSIS — L97511 Non-pressure chronic ulcer of other part of right foot limited to breakdown of skin: Secondary | ICD-10-CM

## 2019-01-13 DIAGNOSIS — L97521 Non-pressure chronic ulcer of other part of left foot limited to breakdown of skin: Secondary | ICD-10-CM | POA: Diagnosis not present

## 2019-01-13 DIAGNOSIS — E11621 Type 2 diabetes mellitus with foot ulcer: Secondary | ICD-10-CM | POA: Diagnosis not present

## 2019-01-13 DIAGNOSIS — M79671 Pain in right foot: Secondary | ICD-10-CM

## 2019-01-15 NOTE — Progress Notes (Signed)
Subjective: Timothy Faulkner presents for at risk diabetic foot care today with elongated mycotic toenails that he cannot cut and which interfere with daily activities.    Timothy Forward, MD is his PCP. Last visit was 12/30/2018.  He states he has an appointment with [name] on this Thursday.  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   Today, he states the "scab" on his left 3rd digit started falling off and there is an underlying wound. He states he has been applying peroxide to his left 3rd digit.  His blood glucose was 70 mg/dl this morning.   He denies any fever, chills, nightsweats, nausea or vomiting.  Timothy Faulkner voices no other concerns on today's visit.   Current Outpatient Medications:  .  aspirin EC 81 MG tablet, Take 81 mg by mouth daily., Disp: , Rfl:  .  AURYXIA 1 GM 210 MG(Fe) tablet, Take 420 mg by mouth 3 (three) times daily with meals. , Disp: , Rfl:  .  calcitRIOL (ROCALTROL) 0.5 MCG capsule, Take 2 capsules (1 mcg total) by mouth 3 (three) times a week., Disp: 60 capsule, Rfl: 0 .  clindamycin (CLEOCIN) 150 MG capsule, Take 1 capsule (150 mg total) by mouth 2 (two) times daily., Disp: 14 capsule, Rfl: 0 .  clopidogrel (PLAVIX) 75 MG tablet, Take 1 tablet (75 mg total) by mouth daily with breakfast., Disp: 30 tablet, Rfl: 0 .  colchicine 0.6 MG tablet, Take 0.6 mg by mouth daily as needed (for gout flare ups). , Disp: , Rfl: 1 .  collagenase (SANTYL) ointment, Apply 1 application topically daily. Apply to right foot wound daily cover with dry dressing., Disp: 15 g, Rfl: 5 .  collagenase (SANTYL) ointment, Apply 1 application topically daily., Disp: 15 g, Rfl: 5 .  gabapentin (NEURONTIN) 100 MG capsule, TAKE 1 CAPSULE BY MOUTH AT BEDTIME., Disp: 90 capsule, Rfl: 0 .  lactulose, encephalopathy, (CHRONULAC) 10 GM/15ML SOLN, Take 20 g by mouth daily as needed (for constipation). , Disp: , Rfl:  .  lidocaine-prilocaine (EMLA)  cream, Apply 1 application topically See admin instructions. Apply small amount to access site 1 to 2 hours before dialysis., Disp: , Rfl: 12 .  midodrine (PROAMATINE) 10 MG tablet, Take 10 mg by mouth 3 (three) times a week. , Disp: , Rfl: 6 .  multivitamin (RENA-VIT) TABS tablet, Take 1 tablet by mouth at bedtime., Disp: 30 tablet, Rfl: 0 .  oxyCODONE (ROXICODONE) 5 MG immediate release tablet, Take 1 tablet (5 mg total) by mouth every 8 (eight) hours as needed for severe pain., Disp: 12 tablet, Rfl: 0 .  pantoprazole (PROTONIX) 40 MG tablet, Take 1 tablet (40 mg total) by mouth daily at 6 (six) AM. (Patient taking differently: Take 40 mg by mouth daily as needed (for acid reflux). ), Disp: 30 tablet, Rfl: 0 .  SANTYL ointment, APPLY TOPICALLY TO RIGHT FOOT WOUND DAILY, COVER WITH DRY DRESSING, Disp: 30 g, Rfl: 0 .  SENSIPAR 30 MG tablet, Take 30 mg by mouth every other day. , Disp: , Rfl:   No Known Allergies  Objective: Vitals:   01/11/19 0901  Temp: 97.7 F (36.5 C)    Vascular Examination: Capillary refill time <3 seconds x 9 digits.   Dorsalis pedis and Posterior tibial pulses nonpalpable b/l.  Digital hair absent x 9 digits.   Skin temperature gradient WNL b/l.  No ischemic changes noted to remaining digits.  Dermatological Examination:             Shallow ulcer with fibrogranular base noted lateral aspect of left 3rd digit. Eschar decreasing dorsally.  Dried blood blisters left hallux and left 5th digit. Both remain stable with no surrounding erythema, edema or flocculence.  Toenails 1-5 left, 1-4 right discolored, thick, dystrophic with subungual debris and pain with palpation to nailbeds due to thickness of nails.  Dressing right foot is clean, dry and intact.   Musculoskeletal: Muscle strength 5/5 to all LE muscle groups.  No gross bony deformities b/l.  No pain, crepitus or joint limitation noted with ROM.   Neurological: Sensation diminished  with 10 gram monofilament.  Assessment: 1.  Painful onychomycosis toenails 1-5 b/l  2.  Diabetic ulcer left 3rd digit noninfected 3.  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 Toenails 1-5 left, 1-4 right were debrided in length and girth without iatrogenic bleeding. 2. Diabetic ulcer left 3rd digit cleansed with wound cleanser. Iodosorb Gel and band-aid applied. Patient instructed to keep digit dry and do not disturb dressing until [name] checks it on Thursday. He related understanding. 3. Patient to continue soft, supportive shoe gear daily left foot and [place] shoe right foot daily. 4. Patient to report any pedal injuries to medical professional immediately. 5. Follow up 9 weeks for toenail debridement. Followed closely by [name] for postop wound right foot. If he experiences worsening in his wound, drainage, redness, swelling odor, fever, chills, nightsweats, nausea or vomiting, he should report the the Emergency Department for evaluation.  He related understanding. 6. Patient/POA to call should there be a concern in the interim.

## 2019-01-21 ENCOUNTER — Ambulatory Visit (INDEPENDENT_AMBULATORY_CARE_PROVIDER_SITE_OTHER): Payer: Medicare Other | Admitting: Podiatry

## 2019-01-21 ENCOUNTER — Encounter: Payer: Self-pay | Admitting: Podiatry

## 2019-01-21 ENCOUNTER — Other Ambulatory Visit: Payer: Self-pay

## 2019-01-21 VITALS — Temp 98.2°F

## 2019-01-21 DIAGNOSIS — E11621 Type 2 diabetes mellitus with foot ulcer: Secondary | ICD-10-CM | POA: Diagnosis not present

## 2019-01-21 DIAGNOSIS — E08621 Diabetes mellitus due to underlying condition with foot ulcer: Secondary | ICD-10-CM

## 2019-01-21 DIAGNOSIS — L97522 Non-pressure chronic ulcer of other part of left foot with fat layer exposed: Secondary | ICD-10-CM | POA: Diagnosis not present

## 2019-01-21 DIAGNOSIS — L97511 Non-pressure chronic ulcer of other part of right foot limited to breakdown of skin: Secondary | ICD-10-CM

## 2019-01-23 NOTE — Progress Notes (Signed)
Subjective:  Patient ID: Timothy Faulkner, male    DOB: 12-22-1945,  MRN: [id number]  Chief Complaint  Patient presents with  . Foot Ulcer    i have some pain on the right side of right foot and the 3rd toe left i can not see it    DOS: 07/15/18 Procedure:  1) Incision Bone Cortex for Osteomyelitis             2) Bone Biopsy Distal Right 5th Metatarsal             3) Metatarsectomy              4) Bone Biopsy Proximal Right 5th Metatarsal             5) Application of Wound VAC  DOS: 07/17/18 Procedure:  1) Bone Biopsy Left 5th Phalanx             2) Delayed Wound Closure - Complex             3) Filleted Toe Flap  DOS: 09/07/18 Procedure:  1) Debridement of Skin, Subcutaneous Tissue, and Bone             2) Partial 5th Metatarsectomy             3) Application of Wound VAC  73 y.o. male returns for wound care. Hx above confirmed with patient.  Review of Systems: Negative except as noted in the HPI. Denies N/V/F/Ch.  Past Medical History:  Diagnosis Date  . Anemia   . Arthritis    Gout  . Automatic implantable cardioverter-defibrillator in situ    [employer]  . CHF   . ESRD on dialysis [facility])    [name] 03/11/2013 (03/11/2013) dialysis M/W/F  . GERD (gastroesophageal reflux disease)   . Gout    "once a year"  . Heart murmur   . HYPERCHOLESTEROLEMIA, MIXED   . HYPERTENSION   . Macular degeneration   . Osteomyelitis ([place])    right foot  . 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 Pacemaker   . Peripheral arterial disease (HCC)    left fifth toe ulcer, healing  . Pneumonia   . Shortness of breath   . 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 Wears dentures   . Wears glasses     Current Outpatient Medications:  .  aspirin EC 81 MG tablet, Take 81 mg by mouth daily., Disp: , Rfl:  .  AURYXIA 1 GM 210 MG(Fe) tablet, Take 420 mg by mouth 3 (three) times daily with meals. , Disp: , Rfl:  .   calcitRIOL (ROCALTROL) 0.5 MCG capsule, Take 2 capsules (1 mcg total) by mouth 3 (three) times a week., Disp: 60 capsule, Rfl: 0 .  clindamycin (CLEOCIN) 150 MG capsule, Take 1 capsule (150 mg total) by mouth 2 (two) times daily., Disp: 14 capsule, Rfl: 0 .  clopidogrel (PLAVIX) 75 MG tablet, Take 1 tablet (75 mg total) by mouth daily with breakfast., Disp: 30 tablet, Rfl: 0 .  colchicine 0.6 MG tablet, Take 0.6 mg by mouth daily as needed (for gout flare ups). , Disp: , Rfl: 1 .  collagenase (SANTYL) ointment, Apply 1 application topically daily. Apply to right foot wound daily cover with dry dressing., Disp: 15 g, Rfl: 5 .  collagenase (SANTYL) ointment, Apply 1 application topically daily., Disp: 15 g, Rfl: 5 .  gabapentin (NEURONTIN) 100 MG capsule,  TAKE 1 CAPSULE BY MOUTH AT BEDTIME., Disp: 90 capsule, Rfl: 0 .  lactulose, encephalopathy, (CHRONULAC) 10 GM/15ML SOLN, Take 20 g by mouth daily as needed (for constipation). , Disp: , Rfl:  .  lidocaine-prilocaine (EMLA) cream, Apply 1 application topically See admin instructions. Apply small amount to access site 1 to 2 hours before dialysis., Disp: , Rfl: 12 .  midodrine (PROAMATINE) 10 MG tablet, Take 10 mg by mouth 3 (three) times a week. , Disp: , Rfl: 6 .  multivitamin (RENA-VIT) TABS tablet, Take 1 tablet by mouth at bedtime., Disp: 30 tablet, Rfl: 0 .  oxyCODONE (ROXICODONE) 5 MG immediate release tablet, Take 1 tablet (5 mg total) by mouth every 8 (eight) hours as needed for severe pain., Disp: 12 tablet, Rfl: 0 .  pantoprazole (PROTONIX) 40 MG tablet, Take 1 tablet (40 mg total) by mouth daily at 6 (six) AM. (Patient taking differently: Take 40 mg by mouth daily as needed (for acid reflux). ), Disp: 30 tablet, Rfl: 0 .  SANTYL ointment, APPLY TOPICALLY TO RIGHT FOOT WOUND DAILY, COVER WITH DRY DRESSING, Disp: 30 g, Rfl: 0 .  SENSIPAR 30 MG tablet, Take 30 mg by mouth every other day. , Disp: , Rfl:   Social History   Tobacco Use   Smoking Status Former Smoker  . Packs/day: 0.75  . Years: 30.00  . Pack years: 22.50  . Types: Cigarettes  . Quit date: 11/25/1992  . Years since quitting: 26.2  Smokeless Tobacco Never Used    No Known Allergies Objective:   Vitals:   01/21/19 1621  Temp: 98.2 F (36.8 C)   There is no height or weight on file to calculate BMI. Constitutional Well developed. Well nourished.  Vascular Foot warm and well perfused. Capillary refill normal to all digits.   Neurologic Normal speech. Oriented to person, place, and time. Epicritic sensation to light touch grossly present bilaterally.  Dermatologic Wound right lateral 5x3 with almost wholly granular wound base      Orthopedic: Tenderness to palpation noted about the surgical site. 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   Radiographs: None.  Assessment:   1. Diabetic ulcer of toe of left foot associated with type 2 diabetes mellitus, with fat layer exposed ([place])   2. Diabetic ulcer of other part of right foot associated with diabetes mellitus due to underlying condition, limited to breakdown of skin [facility])    Plan:  Patient was evaluated and treated and all questions answered.  S/p Right foot debridement -Wound gently debrided today -Dressed with collagen gel. -Discussed previous week's XR findings (patient had left after XRs and before we could discuss. Discussed we will monitor. Is is likely the erosion happened over time. At present his wound is healing, he has no signs of warmth, erythema, or infection. Will monitor.  Procedure: Selective Debridement of Wound Rationale: Removal of devitalized tissue from the wound to promote healing.  Pre-Debridement Wound Measurements: 2.5 cm x 4.4 cm x 0.3 cm  Post-Debridement Wound Measurements: same as pre-debridement. Type of Debridement: sharp selective Tissue Removed: Devitalized soft-tissue Dressing: Dry, sterile, compression dressing. Disposition: Patient  tolerated procedure well. Patient to return in 1 week for follow-up.   Ulcer left 3rd toe -Dressed with collagen gel -Santyl WTD daily  Return in about 1 week (around 01/28/2019) for Wound Care, Bilateral.

## 2019-01-24 ENCOUNTER — Encounter: Payer: Self-pay | Admitting: Podiatry

## 2019-01-24 ENCOUNTER — Ambulatory Visit (INDEPENDENT_AMBULATORY_CARE_PROVIDER_SITE_OTHER): Payer: Medicare Other | Admitting: *Deleted

## 2019-01-24 DIAGNOSIS — I428 Other cardiomyopathies: Secondary | ICD-10-CM

## 2019-01-24 LAB — CUP PACEART REMOTE DEVICE CHECK
Battery Remaining Longevity: 60 mo
Battery Remaining Percentage: 63 %
Brady Statistic RV Percent Paced: 0 %
Date Time Interrogation Session: 20200615053100
HighPow Impedance: 128 Ohm
Implantable Lead Implant Date: 20111118
Implantable Lead Location: 753860
Implantable Lead Model: 181
Implantable Lead Serial Number: 313758
Implantable Pulse Generator Implant Date: 20111118
Lead Channel Impedance Value: 714 Ohm
Lead Channel Pacing Threshold Amplitude: 0.7 V
Lead Channel Pacing Threshold Pulse Width: 0.5 ms
Lead Channel Setting Pacing Amplitude: 2.4 V
Lead Channel Setting Pacing Pulse Width: 0.5 ms
Lead Channel Setting Sensing Sensitivity: 0.5 mV
Pulse Gen Serial Number: 268731

## 2019-01-28 ENCOUNTER — Other Ambulatory Visit: Payer: Self-pay

## 2019-01-28 ENCOUNTER — Ambulatory Visit (INDEPENDENT_AMBULATORY_CARE_PROVIDER_SITE_OTHER): Payer: Medicare Other | Admitting: Podiatry

## 2019-01-28 ENCOUNTER — Encounter: Payer: Self-pay | Admitting: Podiatry

## 2019-01-28 VITALS — Temp 98.2°F

## 2019-01-28 DIAGNOSIS — E08621 Diabetes mellitus due to underlying condition with foot ulcer: Secondary | ICD-10-CM | POA: Diagnosis not present

## 2019-01-28 DIAGNOSIS — E11621 Type 2 diabetes mellitus with foot ulcer: Secondary | ICD-10-CM | POA: Diagnosis not present

## 2019-01-28 DIAGNOSIS — L97522 Non-pressure chronic ulcer of other part of left foot with fat layer exposed: Secondary | ICD-10-CM

## 2019-01-28 DIAGNOSIS — L97511 Non-pressure chronic ulcer of other part of right foot limited to breakdown of skin: Secondary | ICD-10-CM

## 2019-01-31 ENCOUNTER — Encounter: Payer: Self-pay | Admitting: Cardiology

## 2019-01-31 ENCOUNTER — Telehealth: Payer: Self-pay | Admitting: *Deleted

## 2019-01-31 NOTE — Progress Notes (Signed)
Remote ICD transmission.   

## 2019-01-31 NOTE — Telephone Encounter (Addendum)
[facility] states may continue to use previous [facility] referral form. Prepared required form, and demographics to be faxed to [place] once clinicals from last visit received.

## 2019-01-31 NOTE — Telephone Encounter (Signed)
-----   Message from [name], DPM sent at 01/28/2019  4:05 PM EDT ----- Can we get pt reinitiated with [facility]? States they are not coming. Santyl WTD to left 3rd toe and R foot wound qOD.

## 2019-02-07 NOTE — Progress Notes (Signed)
Subjective:  Patient ID: Timothy Faulkner, male    DOB: Dec 10, 1945,  MRN: [id number]  Chief Complaint  Patient presents with  . Foot Ulcer    Follow up ulcers lateral side right and 3rd toe left  "They okay, I guess, can't see them. My daughter wraps them. The right foot hurts some"   DOS: 07/15/18 Procedure:  1) Incision Bone Cortex for Osteomyelitis             2) Bone Biopsy Distal Right 5th Metatarsal             3) Metatarsectomy              4) Bone Biopsy Proximal Right 5th Metatarsal             5) Application of Wound VAC  DOS: 07/17/18 Procedure:  1) Bone Biopsy Left 5th Phalanx             2) Delayed Wound Closure - Complex             3) Filleted Toe Flap  DOS: 09/07/18 Procedure:  1) Debridement of Skin, Subcutaneous Tissue, and Bone             2) Partial 5th Metatarsectomy             3) Application of Wound VAC  73 y.o. male returns for wound care. Hx above confirmed with patient.  Review of Systems: Negative except as noted in the HPI. Denies N/V/F/Ch.  Past Medical History:  Diagnosis Date  . Anemia   . Arthritis    Gout  . Automatic implantable cardioverter-defibrillator in situ    [employer]  . CHF   . ESRD on dialysis [facility])    [name] 03/11/2013 (03/11/2013) dialysis M/W/F  . GERD (gastroesophageal reflux disease)   . Gout    "once a year"  . Heart murmur   . HYPERCHOLESTEROLEMIA, MIXED   . HYPERTENSION   . Macular degeneration   . Osteomyelitis ([place])    right foot  . 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 Pacemaker   . Peripheral arterial disease (HCC)    left fifth toe ulcer, healing  . Pneumonia   . Shortness of breath   . 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 Wears dentures   . Wears glasses     Current Outpatient Medications:  .  aspirin EC 81 MG tablet, Take 81 mg by mouth daily., Disp: , Rfl:  .  AURYXIA 1 GM 210 MG(Fe) tablet, Take 420 mg by mouth 3  (three) times daily with meals. , Disp: , Rfl:  .  calcitRIOL (ROCALTROL) 0.5 MCG capsule, Take 2 capsules (1 mcg total) by mouth 3 (three) times a week., Disp: 60 capsule, Rfl: 0 .  clindamycin (CLEOCIN) 150 MG capsule, Take 1 capsule (150 mg total) by mouth 2 (two) times daily., Disp: 14 capsule, Rfl: 0 .  clopidogrel (PLAVIX) 75 MG tablet, Take 1 tablet (75 mg total) by mouth daily with breakfast., Disp: 30 tablet, Rfl: 0 .  colchicine 0.6 MG tablet, Take 0.6 mg by mouth daily as needed (for gout flare ups). , Disp: , Rfl: 1 .  collagenase (SANTYL) ointment, Apply 1 application topically daily. Apply to right foot wound daily cover with dry dressing., Disp: 15 g, Rfl: 5 .  collagenase (SANTYL) ointment, Apply 1 application topically daily., Disp: 15 g, Rfl: 5 .  gabapentin (NEURONTIN) 100 MG capsule, TAKE 1 CAPSULE BY MOUTH AT BEDTIME., Disp: 90 capsule, Rfl: 0 .  lactulose, encephalopathy, (CHRONULAC) 10 GM/15ML SOLN, Take 20 g by mouth daily as needed (for constipation). , Disp: , Rfl:  .  lidocaine-prilocaine (EMLA) cream, Apply 1 application topically See admin instructions. Apply small amount to access site 1 to 2 hours before dialysis., Disp: , Rfl: 12 .  midodrine (PROAMATINE) 10 MG tablet, Take 10 mg by mouth 3 (three) times a week. , Disp: , Rfl: 6 .  multivitamin (RENA-VIT) TABS tablet, Take 1 tablet by mouth at bedtime., Disp: 30 tablet, Rfl: 0 .  oxyCODONE (ROXICODONE) 5 MG immediate release tablet, Take 1 tablet (5 mg total) by mouth every 8 (eight) hours as needed for severe pain., Disp: 12 tablet, Rfl: 0 .  pantoprazole (PROTONIX) 40 MG tablet, Take 1 tablet (40 mg total) by mouth daily at 6 (six) AM. (Patient taking differently: Take 40 mg by mouth daily as needed (for acid reflux). ), Disp: 30 tablet, Rfl: 0 .  SANTYL ointment, APPLY TOPICALLY TO RIGHT FOOT WOUND DAILY, COVER WITH DRY DRESSING, Disp: 30 g, Rfl: 0 .  SENSIPAR 30 MG tablet, Take 30 mg by mouth every other day. ,  Disp: , Rfl:   Social History   Tobacco Use  Smoking Status Former Smoker  . Packs/day: 0.75  . Years: 30.00  . Pack years: 22.50  . Types: Cigarettes  . Quit date: 11/25/1992  . Years since quitting: 26.2  Smokeless Tobacco Never Used    No Known Allergies Objective:   Vitals:   01/28/19 1549  Temp: 98.2 F (36.8 C)   There is no height or weight on file to calculate BMI. Constitutional Well developed. Well nourished.  Vascular Foot warm and well perfused. Capillary refill normal to all digits.   Neurologic Normal speech. Oriented to person, place, and time. Epicritic sensation to light touch grossly present bilaterally.  Dermatologic Wound 2.5 x4 right foot with granular base. Wholly granular. No warmth, erythema, signs of acute infeciton.  Left 3rd toe with dry eschar no signs of acute infection.  Orthopedic: Tenderness to palpation noted about the surgical site. 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   Radiographs: None.  Assessment:   1. Diabetic ulcer of toe of left foot associated with type 2 diabetes mellitus, with fat layer exposed ([place])   2. Diabetic ulcer of other part of right foot associated with diabetes mellitus due to underlying condition, limited to breakdown of skin [facility])    Plan:  Patient was evaluated and treated and all questions answered.  S/p Right foot debridement -Wound continues to improve. -Dress with collagen gel today. -Continue santyl -Will order resumption of [facility]HC. Santyl WTD qOD.  Ulcer left 3rd toe -Dressed with collagen gel -Continue santyl -Will order [facility] for santyl WTD  Return in about 2 weeks (around 02/11/2019) for Wound Care, Bilateral.

## 2019-02-07 NOTE — Progress Notes (Signed)
Subjective:  Patient ID: Timothy Faulkner, male    DOB: 01/11/1946,  MRN: [id number]  Chief Complaint  Patient presents with  . Wound Check    Right foot wound check. Pt states some pain in his plantar midfoot and sub digits 1-4. Pt denies fever/nausea/vomiting/chills.   DOS: 07/15/18 Procedure:  1) Incision Bone Cortex for Osteomyelitis             2) Bone Biopsy Distal Right 5th Metatarsal             3) Metatarsectomy              4) Bone Biopsy Proximal Right 5th Metatarsal             5) Application of Wound VAC  DOS: 07/17/18 Procedure:  1) Bone Biopsy Left 5th Phalanx             2) Delayed Wound Closure - Complex             3) Filleted Toe Flap  DOS: 09/07/18 Procedure:  1) Debridement of Skin, Subcutaneous Tissue, and Bone             2) Partial 5th Metatarsectomy             3) Application of Wound VAC  73 y.o. male returns for wound care. Hx above confirmed with patient.  Review of Systems: Negative except as noted in the HPI. Denies N/V/F/Ch.  Past Medical History:  Diagnosis Date  . Anemia   . Arthritis    Gout  . Automatic implantable cardioverter-defibrillator in situ    [employer]  . CHF   . ESRD on dialysis [facility])    [name] 03/11/2013 (03/11/2013) dialysis M/W/F  . GERD (gastroesophageal reflux disease)   . Gout    "once a year"  . Heart murmur   . HYPERCHOLESTEROLEMIA, MIXED   . HYPERTENSION   . Macular degeneration   . Osteomyelitis ([place])    right foot  . 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 Pacemaker   . Peripheral arterial disease (HCC)    left fifth toe ulcer, healing  . Pneumonia   . Shortness of breath   . 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 Wears dentures   . Wears glasses     Current Outpatient Medications:  .  aspirin EC 81 MG tablet, Take 81 mg by mouth daily., Disp: , Rfl:  .  AURYXIA 1 GM 210 MG(Fe) tablet, Take 420 mg by mouth 3 (three) times daily  with meals. , Disp: , Rfl:  .  calcitRIOL (ROCALTROL) 0.5 MCG capsule, Take 2 capsules (1 mcg total) by mouth 3 (three) times a week., Disp: 60 capsule, Rfl: 0 .  clindamycin (CLEOCIN) 150 MG capsule, Take 1 capsule (150 mg total) by mouth 2 (two) times daily., Disp: 14 capsule, Rfl: 0 .  clopidogrel (PLAVIX) 75 MG tablet, Take 1 tablet (75 mg total) by mouth daily with breakfast., Disp: 30 tablet, Rfl: 0 .  colchicine 0.6 MG tablet, Take 0.6 mg by mouth daily as needed (for gout flare ups). , Disp: , Rfl: 1 .  collagenase (SANTYL) ointment, Apply 1 application topically daily. Apply to right foot wound daily cover with dry dressing., Disp: 15 g, Rfl: 5 .  collagenase (SANTYL) ointment, Apply 1 application topically daily., Disp: 15 g, Rfl: 5 .  gabapentin (NEURONTIN) 100 MG capsule, TAKE 1 CAPSULE BY MOUTH AT BEDTIME.,  Disp: 90 capsule, Rfl: 0 .  lactulose, encephalopathy, (CHRONULAC) 10 GM/15ML SOLN, Take 20 g by mouth daily as needed (for constipation). , Disp: , Rfl:  .  lidocaine-prilocaine (EMLA) cream, Apply 1 application topically See admin instructions. Apply small amount to access site 1 to 2 hours before dialysis., Disp: , Rfl: 12 .  midodrine (PROAMATINE) 10 MG tablet, Take 10 mg by mouth 3 (three) times a week. , Disp: , Rfl: 6 .  multivitamin (RENA-VIT) TABS tablet, Take 1 tablet by mouth at bedtime., Disp: 30 tablet, Rfl: 0 .  oxyCODONE (ROXICODONE) 5 MG immediate release tablet, Take 1 tablet (5 mg total) by mouth every 8 (eight) hours as needed for severe pain., Disp: 12 tablet, Rfl: 0 .  pantoprazole (PROTONIX) 40 MG tablet, Take 1 tablet (40 mg total) by mouth daily at 6 (six) AM. (Patient taking differently: Take 40 mg by mouth daily as needed (for acid reflux). ), Disp: 30 tablet, Rfl: 0 .  SANTYL ointment, APPLY TOPICALLY TO RIGHT FOOT WOUND DAILY, COVER WITH DRY DRESSING, Disp: 30 g, Rfl: 0 .  SENSIPAR 30 MG tablet, Take 30 mg by mouth every other day. , Disp: , Rfl:   Social  History   Tobacco Use  Smoking Status Former Smoker  . Packs/day: 0.75  . Years: 30.00  . Pack years: 22.50  . Types: Cigarettes  . Quit date: 11/25/1992  . Years since quitting: 26.2  Smokeless Tobacco Never Used    No Known Allergies Objective:   Vitals:   01/13/19 1350  Temp: 98.9 F (37.2 C)   There is no height or weight on file to calculate BMI. Constitutional Well developed. Well nourished.  Vascular Foot warm and well perfused. Capillary refill normal to all digits.   Neurologic Normal speech. Oriented to person, place, and time. Epicritic sensation to light touch grossly present bilaterally.  Dermatologic Wound right lateral 5x3 with almost wholly granular wound base. No signs of acute infection   Let 3rd toe with granular base, no exposed bone, no signs of acute infection.  Orthopedic: Tenderness to palpation noted about the surgical site. 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   Radiographs: None.  Assessment:   1. Pain in right foot   2. Diabetic ulcer of toe of right foot associated with type 2 diabetes mellitus, limited to breakdown of skin ([facility]othy [facility]ulk[facility])   3. Ulcer of great toe, left, limited to breakdown of skin Timothy Faulkner)    Plan:  Patient was evaluated and treated and all questions answered.  S/p Right foot debridement -Wound improving -Continue Santyl WTD -XR today, erosion of 4th metatarsal noted. Likely chronic. No acute findings. Will monitor. Will discuss with patient next week.  Ulcer left 3rd toe -Santyl WTD daily.  Return in about 1 week (around 01/20/2019).

## 2019-02-09 ENCOUNTER — Telehealth: Payer: Self-pay | Admitting: Podiatry

## 2019-02-09 NOTE — Telephone Encounter (Signed)
[facility] called to let us know they received an order for the patient but are unable to schedule home health care with him.  They also stated that the order was sent to their old fax number and wanted to make sure we had the new fax number for them. Fax# [phone]

## 2019-02-10 ENCOUNTER — Other Ambulatory Visit: Payer: Self-pay

## 2019-02-10 ENCOUNTER — Ambulatory Visit (INDEPENDENT_AMBULATORY_CARE_PROVIDER_SITE_OTHER): Payer: Medicare Other | Admitting: Podiatry

## 2019-02-10 VITALS — Temp 97.9°F

## 2019-02-10 DIAGNOSIS — E11621 Type 2 diabetes mellitus with foot ulcer: Secondary | ICD-10-CM | POA: Diagnosis not present

## 2019-02-10 DIAGNOSIS — E08621 Diabetes mellitus due to underlying condition with foot ulcer: Secondary | ICD-10-CM

## 2019-02-10 DIAGNOSIS — L97522 Non-pressure chronic ulcer of other part of left foot with fat layer exposed: Secondary | ICD-10-CM | POA: Diagnosis not present

## 2019-02-10 DIAGNOSIS — L97511 Non-pressure chronic ulcer of other part of right foot limited to breakdown of skin: Secondary | ICD-10-CM | POA: Diagnosis not present

## 2019-02-16 ENCOUNTER — Telehealth: Payer: Self-pay | Admitting: *Deleted

## 2019-02-16 NOTE — Telephone Encounter (Signed)
[facility] - Lattie Haw states they have seen pt for admission to [facility] and would like to see pt weekly and dtr perform dressing changes on the days not seen by [name]. Lattie Haw states pt had area of hard skin on the left foot and complained of pain when removed with gauze bath and she wanted to make sure pt is asked about pain, because his dtr states he won't say anything. Lattie Haw states there is a blood blister on the left great toe.  I asked if she felt pt needed to be seen sooner than Thursday. Lattie Haw states pt should be okay to the Thursday appt.

## 2019-02-17 ENCOUNTER — Ambulatory Visit (INDEPENDENT_AMBULATORY_CARE_PROVIDER_SITE_OTHER): Payer: Medicare Other | Admitting: Podiatry

## 2019-02-17 ENCOUNTER — Other Ambulatory Visit: Payer: Self-pay

## 2019-02-17 DIAGNOSIS — E08621 Diabetes mellitus due to underlying condition with foot ulcer: Secondary | ICD-10-CM | POA: Diagnosis not present

## 2019-02-17 DIAGNOSIS — L97511 Non-pressure chronic ulcer of other part of right foot limited to breakdown of skin: Secondary | ICD-10-CM | POA: Diagnosis not present

## 2019-02-17 DIAGNOSIS — L97522 Non-pressure chronic ulcer of other part of left foot with fat layer exposed: Secondary | ICD-10-CM

## 2019-02-17 DIAGNOSIS — E11621 Type 2 diabetes mellitus with foot ulcer: Secondary | ICD-10-CM | POA: Diagnosis not present

## 2019-02-18 ENCOUNTER — Telehealth: Payer: Self-pay | Admitting: *Deleted

## 2019-02-18 NOTE — Telephone Encounter (Signed)
[name] ordered Collagen dressing for the right plantar foot ulcer measuring 5.0 x 2.0 x 0.5cm, and Hydrogel dressing for the left 3rd toe measuring 2.0 x 2.0 x 0.2cm, daily dressing changes for both areas, and wound cleansing and covering supplies including antimicrobial roll gauze, antimicrobial gauze sponge, and tape 1", gloves and sterile water from [facility]. Orders faxed to [facility].

## 2019-02-20 NOTE — Progress Notes (Signed)
Subjective:  Patient ID: Timothy Faulkner, male    DOB: Nov 13, 1945,  MRN: [id number]  No chief complaint on file.  DOS: 07/15/18 Procedure:  1) Incision Bone Cortex for Osteomyelitis             2) Bone Biopsy Distal Right 5th Metatarsal             3) Metatarsectomy              4) Bone Biopsy Proximal Right 5th Metatarsal             5) Application of Wound VAC  DOS: 07/17/18 Procedure:  1) Bone Biopsy Left 5th Phalanx             2) Delayed Wound Closure - Complex             3) Filleted Toe Flap  DOS: 09/07/18 Procedure:  1) Debridement of Skin, Subcutaneous Tissue, and Bone             2) Partial 5th Metatarsectomy             3) Application of Wound VAC  73 y.o. male returns for wound VAC change. Denies issues with the VAC.  Review of Systems: Negative except as noted in the HPI. Denies N/V/F/Ch.  Past Medical History:  Diagnosis Date  . Anemia   . Arthritis    Gout  . Automatic implantable cardioverter-defibrillator in situ    [employer]  . CHF   . ESRD on dialysis [facility])    [name] 03/11/2013 (03/11/2013) dialysis M/W/F  . GERD (gastroesophageal reflux disease)   . Gout    "once a year"  . Heart murmur   . HYPERCHOLESTEROLEMIA, MIXED   . HYPERTENSION   . Macular degeneration   . Osteomyelitis ([place])    right foot  . 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 Pacemaker   . Peripheral arterial disease (HCC)    left fifth toe ulcer, healing  . Pneumonia   . Shortness of breath   . 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 Wears dentures   . Wears glasses     Current Outpatient Medications:  .  aspirin EC 81 MG tablet, Take 81 mg by mouth daily., Disp: , Rfl:  .  AURYXIA 1 GM 210 MG(Fe) tablet, Take 420 mg by mouth 3 (three) times daily with meals. , Disp: , Rfl:  .  calcitRIOL (ROCALTROL) 0.5 MCG capsule, Take 2 capsules (1 mcg total) by mouth 3 (three) times a week., Disp: 60 capsule, Rfl:  0 .  clindamycin (CLEOCIN) 150 MG capsule, Take 1 capsule (150 mg total) by mouth 2 (two) times daily., Disp: 14 capsule, Rfl: 0 .  clopidogrel (PLAVIX) 75 MG tablet, Take 1 tablet (75 mg total) by mouth daily with breakfast., Disp: 30 tablet, Rfl: 0 .  colchicine 0.6 MG tablet, Take 0.6 mg by mouth daily as needed (for gout flare ups). , Disp: , Rfl: 1 .  collagenase (SANTYL) ointment, Apply 1 application topically daily. Apply to right foot wound daily cover with dry dressing., Disp: 15 g, Rfl: 5 .  collagenase (SANTYL) ointment, Apply 1 application topically daily., Disp: 15 g, Rfl: 5 .  gabapentin (NEURONTIN) 100 MG capsule, TAKE 1 CAPSULE BY MOUTH AT BEDTIME., Disp: 90 capsule, Rfl: 0 .  lactulose, encephalopathy, (CHRONULAC) 10 GM/15ML SOLN, Take 20 g by mouth daily as needed (for constipation). , Disp: , Rfl:  .  lidocaine-prilocaine (EMLA) cream, Apply 1 application topically See admin instructions. Apply small amount to access site 1 to 2 hours before dialysis., Disp: , Rfl: 12 .  midodrine (PROAMATINE) 10 MG tablet, Take 10 mg by mouth 3 (three) times a week. , Disp: , Rfl: 6 .  multivitamin (RENA-VIT) TABS tablet, Take 1 tablet by mouth at bedtime., Disp: 30 tablet, Rfl: 0 .  oxyCODONE (ROXICODONE) 5 MG immediate release tablet, Take 1 tablet (5 mg total) by mouth every 8 (eight) hours as needed for severe pain., Disp: 12 tablet, Rfl: 0 .  pantoprazole (PROTONIX) 40 MG tablet, Take 1 tablet (40 mg total) by mouth daily at 6 (six) AM. (Patient taking differently: Take 40 mg by mouth daily as needed (for acid reflux). ), Disp: 30 tablet, Rfl: 0 .  SANTYL ointment, APPLY TOPICALLY TO RIGHT FOOT WOUND DAILY, COVER WITH DRY DRESSING, Disp: 30 g, Rfl: 0 .  SENSIPAR 30 MG tablet, Take 30 mg by mouth every other day. , Disp: , Rfl:   Social History   Tobacco Use  Smoking Status Former Smoker  . Packs/day: 0.75  . Years: 30.00  . Pack years: 22.50  . Types: Cigarettes  . Quit date:  11/25/1992  . Years since quitting: 26.2  Smokeless Tobacco Never Used    No Known Allergies Objective:  There were no vitals filed for this visit. There is no height or weight on file to calculate BMI. Constitutional Well developed. Well nourished.  Vascular Foot warm and well perfused. Capillary refill normal to all digits.   Neurologic Normal speech. Oriented to person, place, and time. Epicritic sensation to light touch grossly present bilaterally.  Dermatologic  Wound granulating well, no warmth erythema signs of acute infection  Orthopedic: Tenderness to palpation noted about the surgical site. 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   Radiographs: None.  Results for orders placed or performed during the hospital encounter of 09/07/18  Aerobic/Anaerobic Culture (surgical/deep wound)     Status: None   Collection Time: 09/07/18  7:58 AM   Specimen: Bone; Tissue  Result Value Ref Range Status   Specimen Description BONE  Final   Special Requests RIGHT 5TH META  Final   Gram Stain   Final    NO WBC SEEN NO ORGANISMS SEEN Performed at [facility], [address], [place],  [address]    Culture No growth aerobically or anaerobically.  Final   Report Status 09/12/2018 FINAL  Final    Assessment:   1. Diabetic ulcer of other part of right foot associated with diabetes mellitus due to underlying condition, limited to breakdown of skin [facility])    Plan:  Patient was evaluated and treated and all questions answered.  S/p Right foot debridement -Wound continues to granulate in. Wound VAC reapplied. F/u in 3 days for [facility] change.  Return in about 3 days (around 09/21/2018).

## 2019-02-20 NOTE — Progress Notes (Signed)
Subjective:  Patient ID: Timothy Faulkner, male    DOB: 1945-12-03,  MRN: [id number]  No chief complaint on file.  DOS: 07/15/18 Procedure:  1) Incision Bone Cortex for Osteomyelitis             2) Bone Biopsy Distal Right 5th Metatarsal             3) Metatarsectomy              4) Bone Biopsy Proximal Right 5th Metatarsal             5) Application of Wound VAC  DOS: 07/17/18 Procedure:  1) Bone Biopsy Left 5th Phalanx             2) Delayed Wound Closure - Complex             3) Filleted Toe Flap  DOS: 09/07/18 Procedure:  1) Debridement of Skin, Subcutaneous Tissue, and Bone             2) Partial 5th Metatarsectomy             3) Application of Wound VAC  73 y.o. male returns for wound VAC change. Denies new issues.  Review of Systems: Negative except as noted in the HPI. Denies N/V/F/Ch.  Past Medical History:  Diagnosis Date  . Anemia   . Arthritis    Gout  . Automatic implantable cardioverter-defibrillator in situ    [employer]  . CHF   . ESRD on dialysis [facility])    [name] 03/11/2013 (03/11/2013) dialysis M/W/F  . GERD (gastroesophageal reflux disease)   . Gout    "once a year"  . Heart murmur   . HYPERCHOLESTEROLEMIA, MIXED   . HYPERTENSION   . Macular degeneration   . Osteomyelitis ([place])    right foot  . 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 Pacemaker   . Peripheral arterial disease (HCC)    left fifth toe ulcer, healing  . Pneumonia   . Shortness of breath   . 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 Wears dentures   . Wears glasses     Current Outpatient Medications:  .  aspirin EC 81 MG tablet, Take 81 mg by mouth daily., Disp: , Rfl:  .  AURYXIA 1 GM 210 MG(Fe) tablet, Take 420 mg by mouth 3 (three) times daily with meals. , Disp: , Rfl:  .  calcitRIOL (ROCALTROL) 0.5 MCG capsule, Take 2 capsules (1 mcg total) by mouth 3 (three) times a week., Disp: 60 capsule, Rfl: 0 .   clindamycin (CLEOCIN) 150 MG capsule, Take 1 capsule (150 mg total) by mouth 2 (two) times daily., Disp: 14 capsule, Rfl: 0 .  clopidogrel (PLAVIX) 75 MG tablet, Take 1 tablet (75 mg total) by mouth daily with breakfast., Disp: 30 tablet, Rfl: 0 .  colchicine 0.6 MG tablet, Take 0.6 mg by mouth daily as needed (for gout flare ups). , Disp: , Rfl: 1 .  collagenase (SANTYL) ointment, Apply 1 application topically daily. Apply to right foot wound daily cover with dry dressing., Disp: 15 g, Rfl: 5 .  collagenase (SANTYL) ointment, Apply 1 application topically daily., Disp: 15 g, Rfl: 5 .  gabapentin (NEURONTIN) 100 MG capsule, TAKE 1 CAPSULE BY MOUTH AT BEDTIME., Disp: 90 capsule, Rfl: 0 .  lactulose, encephalopathy, (CHRONULAC) 10 GM/15ML SOLN, Take 20 g by mouth daily as needed (for constipation). , Disp: , Rfl:  .  lidocaine-prilocaine (EMLA) cream, Apply 1 application topically See admin instructions. Apply small amount to access site 1 to 2 hours before dialysis., Disp: , Rfl: 12 .  midodrine (PROAMATINE) 10 MG tablet, Take 10 mg by mouth 3 (three) times a week. , Disp: , Rfl: 6 .  multivitamin (RENA-VIT) TABS tablet, Take 1 tablet by mouth at bedtime., Disp: 30 tablet, Rfl: 0 .  oxyCODONE (ROXICODONE) 5 MG immediate release tablet, Take 1 tablet (5 mg total) by mouth every 8 (eight) hours as needed for severe pain., Disp: 12 tablet, Rfl: 0 .  pantoprazole (PROTONIX) 40 MG tablet, Take 1 tablet (40 mg total) by mouth daily at 6 (six) AM. (Patient taking differently: Take 40 mg by mouth daily as needed (for acid reflux). ), Disp: 30 tablet, Rfl: 0 .  SANTYL ointment, APPLY TOPICALLY TO RIGHT FOOT WOUND DAILY, COVER WITH DRY DRESSING, Disp: 30 g, Rfl: 0 .  SENSIPAR 30 MG tablet, Take 30 mg by mouth every other day. , Disp: , Rfl:   Social History   Tobacco Use  Smoking Status Former Smoker  . Packs/day: 0.75  . Years: 30.00  . Pack years: 22.50  . Types: Cigarettes  . Quit date: 11/25/1992  .  Years since quitting: 26.2  Smokeless Tobacco Never Used    No Known Allergies Objective:   There were no vitals filed for this visit. There is no height or weight on file to calculate BMI. Constitutional Well developed. Well nourished.  Vascular Foot warm and well perfused. Capillary refill normal to all digits.   Neurologic Normal speech. Oriented to person, place, and time. Epicritic sensation to light touch grossly present bilaterally.  Dermatologic Wound granulating well, no warmth erythema signs of acute infection  7x4  Orthopedic: Tenderness to palpation noted about the surgical site. 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   Radiographs: None.  Results for orders placed or performed during the hospital encounter of 09/07/18  Aerobic/Anaerobic Culture (surgical/deep wound)     Status: None   Collection Time: 09/07/18  7:58 AM   Specimen: Bone; Tissue  Result Value Ref Range Status   Specimen Description BONE  Final   Special Requests RIGHT 5TH META  Final   Gram Stain   Final    NO WBC SEEN NO ORGANISMS SEEN Performed at [facility], [address] [place], [place] [address]    Culture No growth aerobically or anaerobically.  Final   Report Status 09/12/2018 FINAL  Final    Assessment:   1. Diabetic ulcer of other part of right foot associated with diabetes mellitus due to underlying condition, limited to breakdown of skin [facility])    Plan:  Patient was evaluated and treated and all questions answered.  S/p Right foot debridement -Hold off [facility]. -Continue Santyl WTD. -F/u in 2 weeks for recheck.    Return in about 2 weeks (around 10/14/2018) for Post-op, Wound Care.

## 2019-02-22 ENCOUNTER — Emergency Department (HOSPITAL_COMMUNITY): Payer: Medicare Other

## 2019-02-22 ENCOUNTER — Encounter: Payer: Self-pay | Admitting: Sports Medicine

## 2019-02-22 ENCOUNTER — Ambulatory Visit (INDEPENDENT_AMBULATORY_CARE_PROVIDER_SITE_OTHER): Payer: Medicare Other | Admitting: Sports Medicine

## 2019-02-22 ENCOUNTER — Other Ambulatory Visit: Payer: Self-pay | Admitting: Sports Medicine

## 2019-02-22 ENCOUNTER — Inpatient Hospital Stay (HOSPITAL_COMMUNITY)
Admission: EM | Admit: 2019-02-22 | Discharge: 2019-03-03 | DRG: 981 | Disposition: A | Discharge status: Home Health | Payer: Medicare Other | Attending: Internal Medicine | Admitting: Internal Medicine

## 2019-02-22 ENCOUNTER — Ambulatory Visit (INDEPENDENT_AMBULATORY_CARE_PROVIDER_SITE_OTHER): Payer: Medicare Other

## 2019-02-22 ENCOUNTER — Other Ambulatory Visit: Payer: Self-pay

## 2019-02-22 ENCOUNTER — Encounter (HOSPITAL_COMMUNITY): Payer: Self-pay | Admitting: Emergency Medicine

## 2019-02-22 VITALS — BP 75/55 | HR 98 | Temp 98.0°F | Resp 16

## 2019-02-22 DIAGNOSIS — M109 Gout, unspecified: Secondary | ICD-10-CM | POA: Diagnosis present

## 2019-02-22 DIAGNOSIS — Z95810 Presence of automatic (implantable) cardiac defibrillator: Secondary | ICD-10-CM | POA: Diagnosis not present

## 2019-02-22 DIAGNOSIS — Z89431 Acquired absence of right foot: Secondary | ICD-10-CM

## 2019-02-22 DIAGNOSIS — I96 Gangrene, not elsewhere classified: Secondary | ICD-10-CM

## 2019-02-22 DIAGNOSIS — K219 Gastro-esophageal reflux disease without esophagitis: Secondary | ICD-10-CM | POA: Diagnosis present

## 2019-02-22 DIAGNOSIS — H3530 Unspecified macular degeneration: Secondary | ICD-10-CM | POA: Diagnosis present

## 2019-02-22 DIAGNOSIS — E11621 Type 2 diabetes mellitus with foot ulcer: Secondary | ICD-10-CM | POA: Diagnosis present

## 2019-02-22 DIAGNOSIS — L089 Local infection of the skin and subcutaneous tissue, unspecified: Secondary | ICD-10-CM | POA: Diagnosis present

## 2019-02-22 DIAGNOSIS — E1152 Type 2 diabetes mellitus with diabetic peripheral angiopathy with gangrene: Secondary | ICD-10-CM | POA: Diagnosis present

## 2019-02-22 DIAGNOSIS — I420 Dilated cardiomyopathy: Secondary | ICD-10-CM | POA: Diagnosis present

## 2019-02-22 DIAGNOSIS — L97519 Non-pressure chronic ulcer of other part of right foot with unspecified severity: Secondary | ICD-10-CM | POA: Diagnosis present

## 2019-02-22 DIAGNOSIS — I959 Hypotension, unspecified: Secondary | ICD-10-CM | POA: Diagnosis not present

## 2019-02-22 DIAGNOSIS — I132 Hypertensive heart and chronic kidney disease with heart failure and with stage 5 chronic kidney disease, or end stage renal disease: Secondary | ICD-10-CM | POA: Diagnosis present

## 2019-02-22 DIAGNOSIS — Z87891 Personal history of nicotine dependence: Secondary | ICD-10-CM

## 2019-02-22 DIAGNOSIS — Z95820 Peripheral vascular angioplasty status with implants and grafts: Secondary | ICD-10-CM

## 2019-02-22 DIAGNOSIS — Z1159 Encounter for screening for other viral diseases: Secondary | ICD-10-CM

## 2019-02-22 DIAGNOSIS — N189 Chronic kidney disease, unspecified: Secondary | ICD-10-CM | POA: Diagnosis not present

## 2019-02-22 DIAGNOSIS — N186 End stage renal disease: Secondary | ICD-10-CM | POA: Diagnosis present

## 2019-02-22 DIAGNOSIS — E1151 Type 2 diabetes mellitus with diabetic peripheral angiopathy without gangrene: Secondary | ICD-10-CM

## 2019-02-22 DIAGNOSIS — I998 Other disorder of circulatory system: Secondary | ICD-10-CM | POA: Diagnosis present

## 2019-02-22 DIAGNOSIS — I70209 Unspecified atherosclerosis of native arteries of extremities, unspecified extremity: Secondary | ICD-10-CM | POA: Diagnosis present

## 2019-02-22 DIAGNOSIS — M79675 Pain in left toe(s): Secondary | ICD-10-CM | POA: Diagnosis not present

## 2019-02-22 DIAGNOSIS — Z8249 Family history of ischemic heart disease and other diseases of the circulatory system: Secondary | ICD-10-CM

## 2019-02-22 DIAGNOSIS — M1990 Unspecified osteoarthritis, unspecified site: Secondary | ICD-10-CM | POA: Diagnosis present

## 2019-02-22 DIAGNOSIS — L0390 Cellulitis, unspecified: Secondary | ICD-10-CM | POA: Diagnosis not present

## 2019-02-22 DIAGNOSIS — I9589 Other hypotension: Secondary | ICD-10-CM | POA: Diagnosis not present

## 2019-02-22 DIAGNOSIS — E872 Acidosis: Secondary | ICD-10-CM | POA: Diagnosis present

## 2019-02-22 DIAGNOSIS — R011 Cardiac murmur, unspecified: Secondary | ICD-10-CM | POA: Diagnosis present

## 2019-02-22 DIAGNOSIS — Z79899 Other long term (current) drug therapy: Secondary | ICD-10-CM

## 2019-02-22 DIAGNOSIS — I739 Peripheral vascular disease, unspecified: Secondary | ICD-10-CM | POA: Diagnosis not present

## 2019-02-22 DIAGNOSIS — E785 Hyperlipidemia, unspecified: Secondary | ICD-10-CM | POA: Diagnosis present

## 2019-02-22 DIAGNOSIS — L03116 Cellulitis of left lower limb: Secondary | ICD-10-CM | POA: Diagnosis present

## 2019-02-22 DIAGNOSIS — I5022 Chronic systolic (congestive) heart failure: Secondary | ICD-10-CM | POA: Diagnosis present

## 2019-02-22 DIAGNOSIS — Z992 Dependence on renal dialysis: Secondary | ICD-10-CM

## 2019-02-22 DIAGNOSIS — E1122 Type 2 diabetes mellitus with diabetic chronic kidney disease: Secondary | ICD-10-CM | POA: Diagnosis present

## 2019-02-22 DIAGNOSIS — E11649 Type 2 diabetes mellitus with hypoglycemia without coma: Secondary | ICD-10-CM | POA: Diagnosis not present

## 2019-02-22 DIAGNOSIS — E875 Hyperkalemia: Secondary | ICD-10-CM | POA: Diagnosis present

## 2019-02-22 DIAGNOSIS — Z7902 Long term (current) use of antithrombotics/antiplatelets: Secondary | ICD-10-CM

## 2019-02-22 DIAGNOSIS — N2581 Secondary hyperparathyroidism of renal origin: Secondary | ICD-10-CM | POA: Diagnosis present

## 2019-02-22 DIAGNOSIS — D631 Anemia in chronic kidney disease: Secondary | ICD-10-CM | POA: Diagnosis present

## 2019-02-22 DIAGNOSIS — Z7982 Long term (current) use of aspirin: Secondary | ICD-10-CM

## 2019-02-22 LAB — CBC WITH DIFFERENTIAL/PLATELET
Abs Immature Granulocytes: 0.06 10*3/uL (ref 0.00–0.07)
Basophils Absolute: 0 10*3/uL (ref 0.0–0.1)
Basophils Relative: 0 %
Eosinophils Absolute: 0 10*3/uL (ref 0.0–0.5)
Eosinophils Relative: 0 %
HCT: 46.6 % (ref 39.0–52.0)
Hemoglobin: 14.2 g/dL (ref 13.0–17.0)
Immature Granulocytes: 1 %
Lymphocytes Relative: 10 %
Lymphs Abs: 0.9 10*3/uL (ref 0.7–4.0)
MCH: 31.4 pg (ref 26.0–34.0)
MCHC: 30.5 g/dL (ref 30.0–36.0)
MCV: 103.1 fL — ABNORMAL HIGH (ref 80.0–100.0)
Monocytes Absolute: 1.1 10*3/uL — ABNORMAL HIGH (ref 0.1–1.0)
Monocytes Relative: 12 %
Neutro Abs: 6.9 10*3/uL (ref 1.7–7.7)
Neutrophils Relative %: 77 %
Platelets: 290 10*3/uL (ref 150–400)
RBC: 4.52 MIL/uL (ref 4.22–5.81)
RDW: 15.6 % — ABNORMAL HIGH (ref 11.5–15.5)
WBC: 8.9 10*3/uL (ref 4.0–10.5)
nRBC: 0 % (ref 0.0–0.2)

## 2019-02-22 LAB — COMPREHENSIVE METABOLIC PANEL
ALT: 16 U/L (ref 0–44)
AST: 18 U/L (ref 15–41)
Albumin: 3.4 g/dL — ABNORMAL LOW (ref 3.5–5.0)
Alkaline Phosphatase: 58 U/L (ref 38–126)
Anion gap: 18 — ABNORMAL HIGH (ref 5–15)
BUN: 50 mg/dL — ABNORMAL HIGH (ref 8–23)
CO2: 18 mmol/L — ABNORMAL LOW (ref 22–32)
Calcium: 9.7 mg/dL (ref 8.9–10.3)
Chloride: 96 mmol/L — ABNORMAL LOW (ref 98–111)
Creatinine, Ser: 9.64 mg/dL — ABNORMAL HIGH (ref 0.61–1.24)
GFR calc Af Amer: 6 mL/min — ABNORMAL LOW (ref >60)
GFR calc non Af Amer: 5 mL/min — ABNORMAL LOW (ref >60)
Glucose, Bld: 125 mg/dL — ABNORMAL HIGH (ref 70–99)
Potassium: 5.5 mmol/L — ABNORMAL HIGH (ref 3.5–5.1)
Sodium: 132 mmol/L — ABNORMAL LOW (ref 135–145)
Total Bilirubin: 0.6 mg/dL (ref 0.3–1.2)
Total Protein: 8.6 g/dL — ABNORMAL HIGH (ref 6.5–8.1)

## 2019-02-22 LAB — LACTIC ACID, PLASMA
Lactic Acid, Venous: 3.3 mmol/L (ref 0.5–1.9)
Lactic Acid, Venous: 0.6 mmol/L (ref 0.5–1.9)

## 2019-02-22 LAB — SARS CORONAVIRUS 2 BY RT PCR (HOSPITAL ORDER, PERFORMED IN ~~LOC~~ HOSPITAL LAB): SARS Coronavirus 2: NEGATIVE

## 2019-02-22 MED ORDER — VANCOMYCIN HCL 10 G IV SOLR
1750.0000 mg | Freq: Once | INTRAVENOUS | Status: AC
  Administered 2019-02-22: 1750 mg via INTRAVENOUS
  Filled 2019-02-22: qty 1750

## 2019-02-22 MED ORDER — VANCOMYCIN VARIABLE DOSE PER UNSTABLE RENAL FUNCTION (PHARMACIST DOSING): Status: DC

## 2019-02-22 NOTE — ED Notes (Signed)
ED Provider at bedside. 

## 2019-02-22 NOTE — ED Provider Notes (Signed)
[facility] Provider Note   CSN: [id number] Arrival date & time: 02/22/19  1828    History   Chief Complaint Chief Complaint  Patient presents with  . Wound Check    HPI Timothy Faulkner is a 73 y.o. male.     73 year old male with history of hypertension, diabetes, end-stage renal disease (attends Monday, Wednesday, Friday dialysis), recent amputation of right fifth ray presents with complaint of infection to his left third and fourth toes.  Patient states his home health nurse stopped by to check on him today, noticed the infection in his foot and sent him to the podiatrist.  Patient was found to be hypotensive in the office with a systolic blood pressure of 75 and was sent to the ER by Dr. Brayton El.  Patient reports he is having some discomfort in his foot, no significant pain, no injuries.  Patient reports the wound to his lateral right foot is slowly improving.  No other complaints or concerns.     Past Medical History:  Diagnosis Date  . Anemia   . Arthritis    Gout  . Automatic implantable cardioverter-defibrillator in situ    [employer]  . CHF   . ESRD on dialysis [facility])    [name] 03/11/2013 (03/11/2013) dialysis M/W/F  . GERD (gastroesophageal reflux disease)   . Gout    "once a year"  . Heart murmur   . HYPERCHOLESTEROLEMIA, MIXED   . HYPERTENSION   . Macular degeneration   . Osteomyelitis ([place])    right foot  . 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 Pacemaker   . Peripheral arterial disease (HCC)    left fifth toe ulcer, healing  . Pneumonia   . Shortness of breath   . 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 Wears dentures   . Wears glasses     Patient Active Problem List   Diagnosis Date Noted  . 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 Diabetic foot ulcer ([place]) 09/02/2018  . Encounter for planned post-operative wound closure   . Acute  osteomyelitis of metatarsal bone of right foot ([place])   . Diabetic ulcer of midfoot associated with diabetes mellitus due to underlying condition, with necrosis of bone ([place])   . Osteomyelitis of right foot ([place])   . Vascular calcification   . Cellulitis and abscess of foot, except toes   . Anemia associated with chronic renal failure 07/13/2018  . Cardiomyopathy, dilated, nonischemic ([place]) 07/13/2018  . Diabetic foot infection ([place]) 07/13/2018  . Peripheral arterial disease ([place])   . Macular degeneration   . Chronic systolic heart failure ([place]) 08/22/2014  . Diabetic infection of right foot ([place]) 07/13/2014  . ESRD (end stage renal disease) on dialysis ([place]) 11/11/2013  . Snoring 11/11/2013  . Unspecified sleep apnea 11/11/2013  . Other pancytopenia ([place]) 09/22/2013  . Hemoptysis 09/20/2013  . Pre-transplant evaluation for kidney transplant 01/11/2013  . Automatic implantable cardioverter-defibrillator in situ 10/01/2010  . HYPERCHOLESTEROLEMIA, MIXED 05/03/2010  . Essential hypertension 05/03/2010  . CHF 05/03/2010    Past Surgical History:  Procedure Laterality Date  . A/V FISTULAGRAM Left 07/20/2018   Procedure: A/V FISTULAGRAM;  Surgeon: [name], MD;  Location: [facility];  Service: Cardiovascular;  Laterality: Left;  . ABDOMINAL AORTOGRAM W/LOWER EXTREMITY Bilateral 07/20/2018   Procedure: ABDOMINAL AORTOGRAM W/LOWER EXTREMITY;  Surgeon: [name], MD;  Location:  [facility];  Service: Cardiovascular;  Laterality: Bilateral;  . AMPUTATION Right 09/07/2018   Procedure: Right fifth metatarsectomy;  Surgeon: [name], DPM;  Location: [facility];  Service: Podiatry;  Laterality: Right;  . AV FISTULA PLACEMENT Right 12/13/2012   Procedure: ARTERIOVENOUS (AV) FISTULA CREATION;  Surgeon: [name], MD;  Location: [facility];  Service: Vascular;  Laterality: Right;  Ultrasound guided  . AV FISTULA PLACEMENT Left 05/07/2016   Procedure: LEFT RADIOCEPHALIC  ARTERIOVENOUS (AV) FISTULA CREATION;  Surgeon: [name], MD;  Location: [facility];  Service: Vascular;  Laterality: Left;  . BASCILIC VEIN TRANSPOSITION Right 03/26/2016   Procedure: RIGHT BASILIC VEIN TRANSPOSITION;  Surgeon: [name], MD;  Location: [facility];  Service: Vascular;  Laterality: Right;  . CARDIAC CATHETERIZATION    . CARDIAC DEFIBRILLATOR PLACEMENT     Chemical engineer  . EYE SURGERY Bilateral    Cataract  . FISTULOGRAM Left 04/22/2018   Procedure: FISTULOGRAM UPPER EXTREMITY;  Surgeon: [name], MD;  Location: [facility];  Service: Vascular;  Laterality: Left;  . I&D EXTREMITY Right 07/15/2018   Procedure: IRRIGATION AND DEBRIDEMENT RIGHT FOOT;  Surgeon: [name], DPM;  Location: [facility];  Service: Podiatry;  Laterality: Right;  . INCISION AND DRAINAGE ABSCESS / HEMATOMA OF BURSA / KNEE / THIGH Left 2012   "knee" (03/11/2013)  . INSERT / REPLACE / REMOVE PACEMAKER    . INSERTION OF DIALYSIS CATHETER Left 04/22/2018   Procedure: INSERTION OF DIALYSIS CATHETER;  Surgeon: [name], MD;  Location: [facility];  Service: Vascular;  Laterality: Left;  . IR FLUORO GUIDE CV LINE LEFT  07/15/2018  . IR PTA VENOUS EXCEPT DIALYSIS CIRCUIT  07/15/2018  . LOWER EXTREMITY ANGIOGRAPHY Right 07/21/2018   Procedure: LOWER EXTREMITY ANGIOGRAPHY;  Surgeon: [name], MD;  Location: [facility];  Service: Cardiovascular;  Laterality: Right;  . METATARSAL HEAD EXCISION Right 07/15/2018   Procedure: METATARSAL RESECTION;  Surgeon: [name], DPM;  Location: [facility];  Service: Podiatry;  Laterality: Right;  . MULTIPLE TOOTH EXTRACTIONS    . PERIPHERAL VASCULAR INTERVENTION Right 07/21/2018   Procedure: PERIPHERAL VASCULAR INTERVENTION;  Surgeon: [name], MD;  Location: [facility];  Service: Cardiovascular;  Laterality: Right;  peroneal stents  . REVISON OF ARTERIOVENOUS FISTULA Right 1/61/0960   Procedure: Plication OF Right Arm  ARTERIOVENOUS FISTULA;  Surgeon: Elam Dutch, MD;  [facility];  Service: Vascular;  Laterality: Right;  . REVISON OF ARTERIOVENOUS FISTULA Left 04/22/2018   Procedure: REVISION OF RADIOCEPHALIC ARTERIOVENOUS FISTULA;  Surgeon: [name], MD;  Location: [facility];  Service: Vascular;  Laterality: Left;  . SHUNTOGRAM N/A 05/31/2013   Procedure: Earney Mallet;  Surgeon: [name], MD;  Location: [facility] CATH LAB;  Service: Cardiovascular;  Laterality: N/A;  . UPPER EXTREMITY VENOGRAPHY Right 07/23/2018   Procedure: UPPER EXTREMITY VENOGRAPHY;  Surgeon: [name], MD;  Location: [facility];  Service: Cardiovascular;  Laterality: Right;  . WOUND DEBRIDEMENT Right 07/17/2018   Procedure: Wound Debridement; Closure Filleted toe flap Right Foot;  Surgeon: [name], DPM;  Location: [facility];  Service: Podiatry;  Laterality: Right;  . WOUND DEBRIDEMENT Right 09/07/2018   Procedure: Debridement Right Foot Wound, application of wound vac;  Surgeon: [name], DPM;  Location: [facility];  Service: Podiatry;  Laterality: Right;        Home Medications    Prior to  Admission medications   Medication Sig Start Date End Date Taking? Authorizing Provider  aspirin EC 81 MG tablet Take 81 mg by mouth daily.    [provider]  AURYXIA 1 GM 210 MG(Fe) tablet Take 420 mg by mouth 3 (three) times daily with meals.  11/12/16   [provider]  calcitRIOL (ROCALTROL) 0.5 MCG capsule Take 2 capsules (1 mcg total) by mouth 3 (three) times a week. 07/26/18   Kayleen Memos, DO  clindamycin (CLEOCIN) 150 MG capsule Take 1 capsule (150 mg total) by mouth 2 (two) times daily. 09/07/18   [name], DPM  clopidogrel (PLAVIX) 75 MG tablet Take 1 tablet (75 mg total) by mouth daily with breakfast. 07/23/18   Kayleen Memos, DO  colchicine 0.6 MG tablet Take 0.6 mg by mouth daily as needed (for gout flare ups).  02/26/18   [provider]  collagenase  (SANTYL) ointment Apply 1 application topically daily. Apply to right foot wound daily cover with dry dressing. 07/27/18   [name], DPM  collagenase (SANTYL) ointment Apply 1 application topically daily. 09/23/18   [name], DPM  gabapentin (NEURONTIN) 100 MG capsule TAKE 1 CAPSULE BY MOUTH AT BEDTIME. 10/28/18   Cameron Sprang, MD  lactulose, encephalopathy, (CHRONULAC) 10 GM/15ML SOLN Take 20 g by mouth daily as needed (for constipation).  05/26/18   [provider]  lidocaine-prilocaine (EMLA) cream Apply 1 application topically See admin instructions. Apply small amount to access site 1 to 2 hours before dialysis. 04/12/18   [provider]  midodrine (PROAMATINE) 10 MG tablet Take 10 mg by mouth 3 (three) times a week.  04/26/18   [provider]  multivitamin (RENA-VIT) TABS tablet Take 1 tablet by mouth at bedtime. 07/23/18   Kayleen Memos, DO  oxyCODONE (ROXICODONE) 5 MG immediate release tablet Take 1 tablet (5 mg total) by mouth every 8 (eight) hours as needed for severe pain. 09/07/18 09/07/19  [name], DPM  pantoprazole (PROTONIX) 40 MG tablet Take 1 tablet (40 mg total) by mouth daily at 6 (six) AM. Patient taking differently: Take 40 mg by mouth daily as needed (for acid reflux).  07/24/18   Kayleen Memos, DO  SANTYL ointment APPLY TOPICALLY TO RIGHT FOOT WOUND DAILY, COVER WITH DRY DRESSING 11/15/18   [name], DPM  SENSIPAR 30 MG tablet Take 30 mg by mouth every other day.     [provider]    Family History Family History  Problem Relation Age of Onset  . Heart disease Father   . CAD Father     Social History Social History   Tobacco Use  . Smoking status: Former Smoker    Packs/day: 0.75    Years: 30.00    Pack years: 22.50    Types: Cigarettes    Quit date: 11/25/1992    Years since quitting: 26.2  . Smokeless tobacco: Never Used  Substance Use Topics  . Alcohol use: Not Currently  . Drug use: Not  Currently    Types: Marijuana     Allergies   Patient has no known allergies.   Review of Systems Review of Systems  Constitutional: Negative for chills, fatigue and fever.  Respiratory: Negative for shortness of breath.   Cardiovascular: Negative for chest pain.  Gastrointestinal: Negative for abdominal pain, constipation, diarrhea, nausea and vomiting.  Musculoskeletal: Positive for arthralgias.  Skin: Positive for color change and wound.  Allergic/Immunologic: Positive for  immunocompromised state.  Neurological: Negative for weakness.  Psychiatric/Behavioral: Negative for confusion.  All other systems reviewed and are negative.    Physical Exam Updated Vital Signs BP (!) 80/63   Pulse 95   Temp 98.8 F (37.1 C) (Oral)   Resp 18   Ht 5\' 8"  (1.727 m)   Wt 80.7 kg   SpO2 95%   BMI 27.06 kg/m   Physical Exam Vitals signs and nursing note reviewed.  Constitutional:      General: He is not in acute distress.    Appearance: He is well-developed. He is not diaphoretic.  HENT:     Head: Normocephalic and atraumatic.  Cardiovascular:     Pulses: Normal pulses.  Pulmonary:     Effort: Pulmonary effort is normal.  Musculoskeletal:       Feet:  Skin:    General: Skin is warm and dry.  Neurological:     Mental Status: He is alert and oriented to person, place, and time.  Psychiatric:        Behavior: Behavior normal.   Media Information Left foot   Media Information Right Foot   Document Information  Photos    02/22/2019 21:23  Attached To:  Hospital Encounter on 02/22/19  Source Information  Roque Lias  [facility]     Document Information  Photos    02/22/2019 21:23  Attached To:  Hospital Encounter on 02/22/19  Source Information  Roque Lias  [facility]      ED Treatments / Results  Labs (all labs ordered are listed, but only abnormal results are displayed) Labs Reviewed  COMPREHENSIVE METABOLIC  PANEL - Abnormal; Notable for the following components:      Result Value   Sodium 132 (*)    Potassium 5.5 (*)    Chloride 96 (*)    CO2 18 (*)    Glucose, Bld 125 (*)    BUN 50 (*)    Creatinine, Ser 9.64 (*)    Total Protein 8.6 (*)    Albumin 3.4 (*)    GFR calc non Af Amer 5 (*)    GFR calc Af Amer 6 (*)    Anion gap 18 (*)    All other components within normal limits  LACTIC ACID, PLASMA - Abnormal; Notable for the following components:   Lactic Acid, Venous 3.3 (*)    All other components within normal limits  CBC WITH DIFFERENTIAL/PLATELET - Abnormal; Notable for the following components:   MCV 103.1 (*)    RDW 15.6 (*)    Monocytes Absolute 1.1 (*)    All other components within normal limits  CULTURE, BLOOD (ROUTINE X 2)  CULTURE, BLOOD (ROUTINE X 2)  SARS CORONAVIRUS 2 (HOSPITAL ORDER, [facility])  LACTIC ACID, PLASMA    EKG None  Radiology Dg Foot Complete Left  Result Date: 02/22/2019 CLINICAL DATA:  Right foot wound EXAM: LEFT FOOT - COMPLETE 3+ VIEW COMPARISON:  07/13/2018 FINDINGS: Vascular calcifications. Small plantar calcaneal spur. No soft tissue emphysema. No acute fracture. Mild degenerative change at the first MTP joint. Marginal erosive changes at the head of the first proximal phalanx and the head of the first metatarsal presumably related to history of gout. IMPRESSION: 1. No acute osseous abnormality. 2. Findings felt consistent with history of gout at the first digit. Electronically Signed   By: Donavan Foil M.D.   On: 02/22/2019 20:57   Dg Foot Complete Left  Result Date: 02/22/2019 Please see detailed radiograph report in office note.  Dg Foot Complete Right  Result Date: 02/22/2019 CLINICAL DATA:  Right foot wound EXAM: RIGHT FOOT COMPLETE - 3+ VIEW COMPARISON:  01/13/2019, 09/07/2018 FINDINGS: Chronic fracture deformity of the first proximal phalanx. Ununited fracture deformity proximal shaft fourth metatarsal with  possible increased bone resorption at the fracture cleft. Prior amputation fifth digit at the base of fifth metatarsal without definitive acute bony destructive change. No soft tissue emphysema. IMPRESSION: 1. Prior amputation of the fifth digit at the level of the base of fifth metatarsal without definitive acute osseous changes of the remnant digit 2. Ununited fracture proximal shaft fourth metatarsal, seen on prior radiograph from June 2020. There may be increased osteolysis at the fracture cleft Electronically Signed   By: Donavan Foil M.D.   On: 02/22/2019 21:01    Procedures Procedures (including critical care time)  Medications Ordered in ED Medications  vancomycin (VANCOCIN) 1,750 mg in sodium chloride 0.9 % 500 mL IVPB (1,750 mg Intravenous New Bag/Given 02/22/19 2048)  vancomycin variable dose per unstable renal function (pharmacist dosing) (has no administration in time range)     Initial Impression / Assessment and Plan / ED Course  I have reviewed the triage vital signs and the nursing notes.  Pertinent labs & imaging results that were available during my care of the patient were reviewed by me and considered in my medical decision making (see chart for details).  Clinical Course as of Feb 22 2235  Tue Feb 21, 9266  [id number] 73 year old male with end-stage renal disease, on dialysis presents with complaint of left third and fourth toe discoloration and low blood pressure.  Patient was seen his podiatrist office today and sent to the ER.  On exam, patient has a shallow wound to the lateral aspect of his right foot which he states is slowly improving.  Patient's left third and fourth toes with diminished/absent sensation, capillary refill.  Suspect gangrene or osteomyelitis of the digits.  X-ray of the left foot does not show bony abnormality involving these toes.  Patient's blood pressure has ranged in the 45W to 38U systolic, he has been mildly tachycardic and mildly tachypneic, he is  afebrile.  Lactic acid is 3.3, CBC with normal white blood cell count, CMP with elevated potassium at 5.5, creatinine 9.64.  Patient was given vancomycin per pharmacy consult, blood cultures obtained, sepsis fluid bolus withheld due to renal status.  Plan is to admit to medicine, patient's PCP [name] paged for consult.   [LM]    Clinical Course User Index [LM] Tacy Learn, PA-C      Final Clinical Impressions(s) / ED Diagnoses   Final diagnoses:  Hypotension, unspecified hypotension type  Gangrene of toe of left foot [facility])    ED Discharge Orders    None       Roque Lias 02/22/19 2236    Carmin Muskrat, MD 02/22/19 [phone]

## 2019-02-22 NOTE — ED Notes (Signed)
Pt stated that he doesn't produce any urine.

## 2019-02-22 NOTE — ED Triage Notes (Signed)
Pt states he was sent here from podiatry to rule out gangrene in left foot per pt he also has wound on the right.

## 2019-02-22 NOTE — ED Notes (Signed)
Patient transported to X-ray 

## 2019-02-22 NOTE — Progress Notes (Addendum)
Pharmacy Antibiotic Note  Timothy Faulkner is a 73 y.o. male admitted on 02/22/2019 with osteomyelitis. Pt is on HD M/W/F (per previous notes). Tmax 98.8 and WBC 8.9. Pharmacy has been consulted for vancomycin dosing. Will f/u nephrology consult for inpatient HD schedule and schedule vancomycin maintenance dose qHD.  Plan: Vancomycin 1.75g IV x1 then vancomycin 1g IV qHD F/u w/ nephrology plans for inpatient HD to schedule maintenance vancomycin dosing Monitor clinical progression  Height: 5\' 8"  (172.7 cm) Weight: 178 lb (80.7 kg) IBW/kg (Calculated) : 68.4  Temp (24hrs), Avg:98.4 F (36.9 C), Min:98 F (36.7 C), Max:98.8 F (37.1 C)  Recent Labs  Lab 02/22/19 1900  WBC 8.9  CREATININE 9.64*  LATICACIDVEN 3.3*    Estimated Creatinine Clearance: 6.7 mL/min (A) (by C-G formula based on SCr of 9.64 mg/dL (H)).    No Known Allergies  Antimicrobials this admission: Vancomycin 7/14 >>   Dose adjustments this admission: n/a  Microbiology results: Pending  Thank you for allowing pharmacy to be a part of this patient's care.  Natale Lay 02/22/2019 8:16 PM   ASddum:  Add zosyn 3.375 gm IV q12 [name], PharmD

## 2019-02-22 NOTE — Progress Notes (Signed)
Subjective: Timothy Faulkner is a 73 y.o. male patient seen in office for evaluation of black toes on the left foot at the third and fourth toes.  Patient is assisted by daughter who reports that the toes started to turn dark and she first noticed it on yesterday when doing the dressing change she also notified the home nurse who came out to see him today who advised patient to come into the office sooner and not wait until his appointment scheduled on Thursday.  Patient is a patient of [name] who has a significant history for bilateral foot ulceration and history of right fifth toe amputation.  Patient was using Santyl to both foot ulcerations with the assistance from home health and daughter.  Patient is diabetic and reports that he did not check his blood sugar today last A1c was 4.7 daughter reports that if he has been having issues with eating and having issues with odor and some pain however her dad does not complain and does not say much even though she know that this is not his normal.  Patient denies any nausea vomiting fever or chills or any other constitutional symptoms at this time.  Patient Active Problem List   Diagnosis Date Noted  . 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 Diabetic foot ulcer ([place]) 09/02/2018  . Encounter for planned post-operative wound closure   . Acute osteomyelitis of metatarsal bone of right foot ([place])   . Diabetic ulcer of midfoot associated with diabetes mellitus due to underlying condition, with necrosis of bone ([place])   . Osteomyelitis of right foot ([place])   . Vascular calcification   . Cellulitis and abscess of foot, except toes   . Anemia associated with chronic renal failure 07/13/2018  . Cardiomyopathy, dilated, nonischemic ([place]) 07/13/2018  . Diabetic foot infection ([place]) 07/13/2018  . Peripheral arterial disease ([place])   . Macular degeneration   . Chronic systolic heart failure ([place]) 08/22/2014  . Diabetic infection of right foot  ([place]) 07/13/2014  . ESRD (end stage renal disease) on dialysis ([place]) 11/11/2013  . Snoring 11/11/2013  . Unspecified sleep apnea 11/11/2013  . Other pancytopenia ([place]) 09/22/2013  . Hemoptysis 09/20/2013  . Pre-transplant evaluation for kidney transplant 01/11/2013  . Automatic implantable cardioverter-defibrillator in situ 10/01/2010  . HYPERCHOLESTEROLEMIA, MIXED 05/03/2010  . Essential hypertension 05/03/2010  . CHF 05/03/2010   No current facility-administered medications on file prior to visit.    Current Outpatient Medications on File Prior to Visit  Medication Sig Dispense Refill  . aspirin EC 81 MG tablet Take 81 mg by mouth daily.    Lorin Picket 1 GM 210 MG(Fe) tablet Take 420 mg by mouth 3 (three) times daily with meals.     . calcitRIOL (ROCALTROL) 0.5 MCG capsule Take 2 capsules (1 mcg total) by mouth 3 (three) times a week. 60 capsule 0  . clindamycin (CLEOCIN) 150 MG capsule Take 1 capsule (150 mg total) by mouth 2 (two) times daily. 14 capsule 0  . clopidogrel (PLAVIX) 75 MG tablet Take 1 tablet (75 mg total) by mouth daily with breakfast. 30 tablet 0  . colchicine 0.6 MG tablet Take 0.6 mg by mouth daily as needed (for gout flare ups).   1  . collagenase (SANTYL) ointment Apply 1 application topically daily. Apply to right foot wound daily cover with dry dressing. 15 g 5  . collagenase (SANTYL) ointment Apply 1 application topically daily. 15 g 5  . gabapentin (NEURONTIN)  100 MG capsule TAKE 1 CAPSULE BY MOUTH AT BEDTIME. 90 capsule 0  . lactulose, encephalopathy, (CHRONULAC) 10 GM/15ML SOLN Take 20 g by mouth daily as needed (for constipation).     Marland Kitchen lidocaine-prilocaine (EMLA) cream Apply 1 application topically See admin instructions. Apply small amount to access site 1 to 2 hours before dialysis.  12  . midodrine (PROAMATINE) 10 MG tablet Take 10 mg by mouth 3 (three) times a week.   6  . multivitamin (RENA-VIT) TABS tablet Take 1 tablet by mouth at bedtime. 30 tablet 0   . oxyCODONE (ROXICODONE) 5 MG immediate release tablet Take 1 tablet (5 mg total) by mouth every 8 (eight) hours as needed for severe pain. 12 tablet 0  . pantoprazole (PROTONIX) 40 MG tablet Take 1 tablet (40 mg total) by mouth daily at 6 (six) AM. (Patient taking differently: Take 40 mg by mouth daily as needed (for acid reflux). ) 30 tablet 0  . SANTYL ointment APPLY TOPICALLY TO RIGHT FOOT WOUND DAILY, COVER WITH DRY DRESSING 30 g 0  . SENSIPAR 30 MG tablet Take 30 mg by mouth every other day.      No Known Allergies  Recent Results (from the past 2160 hour(s))  CUP PACEART REMOTE DEVICE CHECK     Status: None   Collection Time: 01/24/19  5:31 AM  Result Value Ref Range   Date Time Interrogation Session [id number]    Pulse Generator Manufacturer BOST    Pulse Gen Model E102 TELIGEN 100    Pulse Gen Serial Number [id number]    Clinic Name [facility]    Implantable Pulse Generator Type Implantable Cardiac Defibulator    Implantable Pulse Generator Implant Date [id number]    Implantable Lead Manufacturer GUIC    Implantable Lead Model 0181 [employer] Reliance SG    Implantable Lead Serial Number [id number]    Implantable Lead Implant Date [id number]    Implantable Lead Location Detail 1 APEX    Implantable Lead Location [id number]    Lead Channel Setting Sensing Sensitivity 0.5 mV   Lead Channel Setting Sensing Adaptation Mode Adaptive Sensing    Lead Channel Setting Pacing Pulse Width 0.5 ms   Lead Channel Setting Pacing Amplitude 2.4 V   Lead Channel Impedance Value 714 ohm   Lead Channel Pacing Threshold Amplitude 0.7 V   Lead Channel Pacing Threshold Pulse Width 0.5 ms   HighPow Impedance 128 ohm   HighPow Imped Status Check Lead    Battery Status BOS    Battery Remaining Longevity 60 mo   Battery Remaining Percentage 63 %   Brady Statistic RV Percent Paced 0 %  Comprehensive metabolic panel     Status: Abnormal   Collection Time: 02/22/19  7:00 PM  Result Value Ref Range    Sodium 132 (L) 135 - 145 mmol/L   Potassium 5.5 (H) 3.5 - 5.1 mmol/L   Chloride 96 (L) 98 - 111 mmol/L   CO2 18 (L) 22 - 32 mmol/L   Glucose, Bld 125 (H) 70 - 99 mg/dL   BUN 50 (H) 8 - 23 mg/dL   Creatinine, Ser 9.64 (H) 0.61 - 1.24 mg/dL   Calcium 9.7 8.9 - 10.3 mg/dL   Total Protein 8.6 (H) 6.5 - 8.1 g/dL   Albumin 3.4 (L) 3.5 - 5.0 g/dL   AST 18 15 - 41 U/L   ALT 16 0 - 44 U/L   Alkaline Phosphatase 58 38 - 126 U/L   Total Bilirubin 0.6 0.3 -  1.2 mg/dL   GFR calc non Af Amer 5 (L) >60 mL/min   GFR calc Af Amer 6 (L) >60 mL/min   Anion gap 18 (H) 5 - 15    Comment: Performed at [facility] [address] [place], [place] [address]  Lactic acid, plasma     Status: Abnormal   Collection Time: 02/22/19  7:00 PM  Result Value Ref Range   Lactic Acid, Venous 3.3 (HH) 0.5 - 1.9 mmol/L    Comment: CRITICAL RESULT CALLED TO, READ BACK BY AND VERIFIED WITH: Assunta Found RN AT [facility] [phone] BY Marcos Eke Performed at [facility], [place] [address] [place], [place] [address]   CBC with Differential     Status: Abnormal   Collection Time: 02/22/19  7:00 PM  Result Value Ref Range   WBC 8.9 4.0 - 10.5 K/uL   RBC 4.52 4.22 - 5.81 MIL/uL   Hemoglobin 14.2 13.0 - 17.0 g/dL   HCT 46.6 39.0 - 52.0 %   MCV 103.1 (H) 80.0 - 100.0 fL   MCH 31.4 26.0 - 34.0 pg   MCHC 30.5 30.0 - 36.0 g/dL   RDW 15.6 (H) 11.5 - 15.5 %   Platelets 290 150 - 400 K/uL   nRBC 0.0 0.0 - 0.2 %   Neutrophils Relative % 77 %   Neutro Abs 6.9 1.7 - 7.7 K/uL   Lymphocytes Relative 10 %   Lymphs Abs 0.9 0.7 - 4.0 K/uL   Monocytes Relative 12 %   Monocytes Absolute 1.1 (H) 0.1 - 1.0 K/uL   Eosinophils Relative 0 %   Eosinophils Absolute 0.0 0.0 - 0.5 K/uL   Basophils Relative 0 %   Basophils Absolute 0.0 0.0 - 0.1 K/uL   Immature Granulocytes 1 %   Abs Immature Granulocytes 0.06 0.00 - 0.07 K/uL    Comment: Performed at [facility] [address], [place], [place] [address]     Objective: There were no vitals filed for this visit.  General: Patient is awake, alert, oriented x 3 and in no acute distress.  Dermatology: There is significant gangrene localized to the third and fourth digits of the left foot with areas of marginal necrosis however at the distal tuft of the left third toe there is significant bogginess with malodor consistent with changes of wet gangrene at this digit.  There is focal localized erythema and edema on the left.   The dressing is left intact on the right foot.   Vascular: Dorsalis Pedis pulse = 0/4 Bilateral,  Posterior Tibial pulse = 0/4 Bilateral,  Capillary Fill Time < 5 seconds however difficult to assess on the left third and fourth toes due to gangrene.  Neurologic: Protective sensation absent bilateral using Semmes Weinstein monofilament.  Musculosketal: Very minimal pain to palpation of left foot.  Patient is status post right fifth toe and metatarsal amputation  Xrays, Left foot: No bony destruction suggestive of osteomyelitis.  Swelling to digits but no obvious gas in soft tissues.   Recent Labs    07/15/18 1847 07/15/18 1850 09/07/18 0758  GRAMSTAIN MODERATE WBC PRESENT,BOTH PMN AND MONONUCLEAR NO ORGANISMS SEEN  FEW WBC PRESENT,BOTH PMN AND MONONUCLEAR RARE GRAM NEGATIVE RODS  NO WBC SEEN NO ORGANISMS SEEN Performed at [facility], 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 -Xrays reviewed -Due to the extensive nature of the gangrene of these toes patient was evaluated for admission to [facility].  I called and discussed this case with ER Dr.Lockhart who is awaiting patient presentation to hospital for admission for gangrene for antibiotics with podiatry follow-up as well as vascular follow-up patient is a current patient of Dr.Fields; patient is aware that after admission his podiatrist [name] will follow him and make a final determination on the extent of podiatric care that can be continued to be offered -Applied Betadine and dry sterile dressing and instructed patient to keep intact upon arrival at hospital likely the dressing will be removed for the toes to be assessed by the ER doctor and admitting physician - Advised patient to go to the ER as instructed and provided patient with the ER doctor's information that accepted my phone call -Extensive time was spent with this patient and his daughter explaining plan of care beyond 25 minutes in coordination of care and direct face-to-face contact [facility] admission for gangrene left foot Landis Martins, DPM

## 2019-02-23 ENCOUNTER — Encounter (HOSPITAL_COMMUNITY): Payer: Medicare Other

## 2019-02-23 ENCOUNTER — Encounter (HOSPITAL_COMMUNITY): Payer: Self-pay | Admitting: Internal Medicine

## 2019-02-23 DIAGNOSIS — L97515 Non-pressure chronic ulcer of other part of right foot with muscle involvement without evidence of necrosis: Secondary | ICD-10-CM

## 2019-02-23 DIAGNOSIS — I96 Gangrene, not elsewhere classified: Secondary | ICD-10-CM

## 2019-02-23 DIAGNOSIS — I998 Other disorder of circulatory system: Secondary | ICD-10-CM

## 2019-02-23 DIAGNOSIS — I739 Peripheral vascular disease, unspecified: Secondary | ICD-10-CM

## 2019-02-23 DIAGNOSIS — L03116 Cellulitis of left lower limb: Principal | ICD-10-CM

## 2019-02-23 DIAGNOSIS — Z992 Dependence on renal dialysis: Secondary | ICD-10-CM

## 2019-02-23 DIAGNOSIS — I959 Hypotension, unspecified: Secondary | ICD-10-CM | POA: Diagnosis present

## 2019-02-23 DIAGNOSIS — N186 End stage renal disease: Secondary | ICD-10-CM

## 2019-02-23 DIAGNOSIS — E08621 Diabetes mellitus due to underlying condition with foot ulcer: Secondary | ICD-10-CM

## 2019-02-23 LAB — BASIC METABOLIC PANEL
Anion gap: 16 — ABNORMAL HIGH (ref 5–15)
BUN: 56 mg/dL — ABNORMAL HIGH (ref 8–23)
CO2: 18 mmol/L — ABNORMAL LOW (ref 22–32)
Calcium: 9.4 mg/dL (ref 8.9–10.3)
Chloride: 99 mmol/L (ref 98–111)
Creatinine, Ser: 9.77 mg/dL — ABNORMAL HIGH (ref 0.61–1.24)
GFR calc Af Amer: 6 mL/min — ABNORMAL LOW (ref >60)
GFR calc non Af Amer: 5 mL/min — ABNORMAL LOW (ref >60)
Glucose, Bld: 103 mg/dL — ABNORMAL HIGH (ref 70–99)
Potassium: 5 mmol/L (ref 3.5–5.1)
Sodium: 133 mmol/L — ABNORMAL LOW (ref 135–145)

## 2019-02-23 LAB — MRSA PCR SCREENING: MRSA by PCR: NEGATIVE

## 2019-02-23 LAB — CBC
HCT: 44.5 % (ref 39.0–52.0)
Hemoglobin: 14.2 g/dL (ref 13.0–17.0)
MCH: 31.6 pg (ref 26.0–34.0)
MCHC: 31.9 g/dL (ref 30.0–36.0)
MCV: 99.1 fL (ref 80.0–100.0)
Platelets: 260 10*3/uL (ref 150–400)
RBC: 4.49 MIL/uL (ref 4.22–5.81)
RDW: 15.4 % (ref 11.5–15.5)
WBC: 8.2 10*3/uL (ref 4.0–10.5)
nRBC: 0 % (ref 0.0–0.2)

## 2019-02-23 LAB — GLUCOSE, CAPILLARY
Glucose-Capillary: 118 mg/dL — ABNORMAL HIGH (ref 70–99)
Glucose-Capillary: 133 mg/dL — ABNORMAL HIGH (ref 70–99)
Glucose-Capillary: 97 mg/dL (ref 70–99)
Glucose-Capillary: 144 mg/dL — ABNORMAL HIGH (ref 70–99)

## 2019-02-23 MED ORDER — ASPIRIN EC 81 MG PO TBEC
81.0000 mg | DELAYED_RELEASE_TABLET | Freq: Every day | ORAL | Status: DC
  Administered 2019-02-23 – 2019-03-03 (×9): 81 mg via ORAL
  Filled 2019-02-23 (×10): qty 1

## 2019-02-23 MED ORDER — FERRIC CITRATE 1 GM 210 MG(FE) PO TABS
630.0000 mg | ORAL_TABLET | Freq: Three times a day (TID) | ORAL | Status: DC
  Administered 2019-02-23: 420 mg via ORAL
  Administered 2019-02-24 (×3): 630 mg via ORAL
  Filled 2019-02-23 (×6): qty 3

## 2019-02-23 MED ORDER — ACETAMINOPHEN 325 MG PO TABS: 650.0000 mg | ORAL_TABLET | Freq: Four times a day (QID) | ORAL | Status: DC | PRN

## 2019-02-23 MED ORDER — PIPERACILLIN-TAZOBACTAM 3.375 G IVPB 30 MIN
3.3750 g | Freq: Once | INTRAVENOUS | Status: AC
  Administered 2019-02-23: 3.375 g via INTRAVENOUS
  Filled 2019-02-23: qty 50

## 2019-02-23 MED ORDER — CALCITRIOL 0.25 MCG PO CAPS
0.5000 ug | ORAL_CAPSULE | ORAL | Status: DC
  Administered 2019-02-23 – 2019-03-02 (×4): 0.5 ug via ORAL
  Filled 2019-02-23 (×3): qty 2

## 2019-02-23 MED ORDER — PANTOPRAZOLE SODIUM 40 MG PO TBEC
40.0000 mg | DELAYED_RELEASE_TABLET | Freq: Every day | ORAL | Status: DC
  Administered 2019-02-23 – 2019-03-03 (×8): 40 mg via ORAL
  Filled 2019-02-23 (×8): qty 1

## 2019-02-23 MED ORDER — CINACALCET HCL 30 MG PO TABS
30.0000 mg | ORAL_TABLET | ORAL | Status: DC
  Administered 2019-02-23 – 2019-03-02 (×4): 30 mg via ORAL
  Filled 2019-02-23 (×4): qty 1

## 2019-02-23 MED ORDER — HEPARIN SODIUM (PORCINE) 1000 UNIT/ML IJ SOLN
INTRAMUSCULAR | Status: AC
  Administered 2019-02-23: 4200 [IU] via ARTERIOVENOUS_FISTULA
  Filled 2019-02-23: qty 4

## 2019-02-23 MED ORDER — VANCOMYCIN HCL IN DEXTROSE 750-5 MG/150ML-% IV SOLN
INTRAVENOUS | Status: AC
  Administered 2019-02-23: 750 mg via INTRAVENOUS
  Filled 2019-02-23: qty 150

## 2019-02-23 MED ORDER — SODIUM CHLORIDE 0.9 % IV BOLUS
500.0000 mL | Freq: Once | INTRAVENOUS | Status: AC
  Administered 2019-02-23: 500 mL via INTRAVENOUS

## 2019-02-23 MED ORDER — ACETAMINOPHEN 650 MG RE SUPP: 650.0000 mg | Freq: Four times a day (QID) | RECTAL | Status: DC | PRN

## 2019-02-23 MED ORDER — PIPERACILLIN-TAZOBACTAM 3.375 G IVPB
3.3750 g | Freq: Two times a day (BID) | INTRAVENOUS | Status: DC
  Administered 2019-02-23 – 2019-03-02 (×13): 3.375 g via INTRAVENOUS
  Filled 2019-02-23 (×18): qty 50

## 2019-02-23 MED ORDER — GABAPENTIN 100 MG PO CAPS
100.0000 mg | ORAL_CAPSULE | Freq: Every day | ORAL | Status: DC
  Administered 2019-02-23 – 2019-03-02 (×8): 100 mg via ORAL
  Filled 2019-02-23 (×8): qty 1

## 2019-02-23 MED ORDER — RENA-VITE PO TABS
1.0000 | ORAL_TABLET | Freq: Every day | ORAL | Status: DC
  Administered 2019-02-23 – 2019-03-02 (×8): 1 via ORAL
  Filled 2019-02-23 (×8): qty 1

## 2019-02-23 MED ORDER — VANCOMYCIN HCL IN DEXTROSE 750-5 MG/150ML-% IV SOLN
750.0000 mg | INTRAVENOUS | Status: DC
  Administered 2019-02-23 – 2019-02-28 (×2): 750 mg via INTRAVENOUS
  Filled 2019-02-23 (×4): qty 150

## 2019-02-23 MED ORDER — CLOPIDOGREL BISULFATE 75 MG PO TABS
75.0000 mg | ORAL_TABLET | Freq: Every day | ORAL | Status: DC
  Administered 2019-02-24 – 2019-03-03 (×7): 75 mg via ORAL
  Filled 2019-02-23 (×8): qty 1

## 2019-02-23 MED ORDER — HEPARIN SODIUM (PORCINE) 1000 UNIT/ML IJ SOLN
INTRAMUSCULAR | Status: AC
  Administered 2019-02-23: 5000 [IU]
  Filled 2019-02-23: qty 5

## 2019-02-23 MED ORDER — CHLORHEXIDINE GLUCONATE CLOTH 2 % EX PADS
6.0000 | MEDICATED_PAD | Freq: Every day | CUTANEOUS | Status: DC
  Administered 2019-02-23 – 2019-02-24 (×2): 6 via TOPICAL

## 2019-02-23 MED ORDER — MIDODRINE HCL 5 MG PO TABS
10.0000 mg | ORAL_TABLET | ORAL | Status: DC
  Administered 2019-02-23 – 2019-03-02 (×3): 10 mg via ORAL
  Filled 2019-02-23 (×2): qty 2

## 2019-02-23 MED ORDER — FERRIC CITRATE 1 GM 210 MG(FE) PO TABS
420.0000 mg | ORAL_TABLET | Freq: Two times a day (BID) | ORAL | Status: DC
  Administered 2019-02-23: 10:00:00 420 mg via ORAL
  Filled 2019-02-23 (×2): qty 2

## 2019-02-23 MED ORDER — ONDANSETRON HCL 4 MG PO TABS: 4.0000 mg | ORAL_TABLET | Freq: Four times a day (QID) | ORAL | Status: DC | PRN

## 2019-02-23 MED ORDER — ONDANSETRON HCL 4 MG/2ML IJ SOLN: 4.0000 mg | Freq: Four times a day (QID) | INTRAMUSCULAR | Status: DC | PRN

## 2019-02-23 NOTE — ED Notes (Signed)
ED TO INPATIENT HANDOFF REPORT  ED Nurse Name and Phone #: [phone] [name]  S Name/Age/Gender Timothy Faulkner 73 y.o. male Room/Bed: 045C/045C  Code Status   Code Status: Prior  Home/SNF/Other Home Patient oriented to: self, place, time and situation Is this baseline? Yes   Triage Complete: Triage complete  Chief Complaint L foot irritation  Triage Note Pt states he was sent here from podiatry to rule out gangrene in left foot per pt he also has wound on the right.    Allergies No Known Allergies  Level of Care/Admitting Diagnosis ED Disposition    ED Disposition Condition [place] [facility] Area: [place] [100100]  Level of Care: Progressive [102]  Covid Evaluation: Asymptomatic Screening Protocol (No Symptoms)  Diagnosis: Gangrene of toe of left foot [facility]) [[id number]]  Admitting Physician: Rise Patience [id number]  Attending Physician: Rise Patience [phone]  Estimated length of stay: past midnight tomorrow  Certification:: I certify this patient will need inpatient services for at least 2 midnights  PT Class (Do Not Modify): Inpatient [101]  PT Acc Code (Do Not Modify): Private [1]       B Medical/Surgery History Past Medical History:  Diagnosis Date  . Anemia   . Arthritis    Gout  . Automatic implantable cardioverter-defibrillator in situ    [employer]  . CHF   . ESRD on dialysis [facility])    [name] 03/11/2013 (03/11/2013) dialysis M/W/F  . GERD (gastroesophageal reflux disease)   . Gout    "once a year"  . Heart murmur   . HYPERCHOLESTEROLEMIA, MIXED   . HYPERTENSION   . Macular degeneration   . Osteomyelitis ([place])    right foot  . 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 Pacemaker   . Peripheral arterial disease (HCC)    left fifth toe ulcer, healing  . Pneumonia   . Shortness of breath   . 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 Wears dentures   . Wears  glasses    Past Surgical History:  Procedure Laterality Date  . A/V FISTULAGRAM Left 07/20/2018   Procedure: A/V FISTULAGRAM;  Surgeon: [name], MD;  Location: [place] [facility];  Service: Cardiovascular;  Laterality: Left;  . ABDOMINAL AORTOGRAM W/LOWER EXTREMITY Bilateral 07/20/2018   Procedure: ABDOMINAL AORTOGRAM W/LOWER EXTREMITY;  Surgeon: [name], MD;  Location: [place] [facility];  Service: Cardiovascular;  Laterality: Bilateral;  . AMPUTATION Right 09/07/2018   Procedure: Right fifth metatarsectomy;  Surgeon: [name], DPM;  Location: [place];  Service: Podiatry;  Laterality: Right;  . AV FISTULA PLACEMENT Right 12/13/2012   Procedure: ARTERIOVENOUS (AV) FISTULA CREATION;  Surgeon: [name], MD;  Location: [place];  Service: Vascular;  Laterality: Right;  Ultrasound guided  . AV FISTULA PLACEMENT Left 05/07/2016   Procedure: LEFT RADIOCEPHALIC ARTERIOVENOUS (AV) FISTULA CREATION;  Surgeon: [name], MD;  Location: [place];  Service: Vascular;  Laterality: Left;  . BASCILIC VEIN TRANSPOSITION Right 03/26/2016   Procedure: RIGHT BASILIC VEIN TRANSPOSITION;  Surgeon: [name], MD;  Location: [place];  Service: Vascular;  Laterality: Right;  . CARDIAC CATHETERIZATION    . CARDIAC DEFIBRILLATOR PLACEMENT     Chemical engineer  . EYE SURGERY Bilateral    Cataract  . FISTULOGRAM Left 04/22/2018   Procedure: FISTULOGRAM UPPER EXTREMITY;  Surgeon: [name], MD;  Location: [place];  Service: Vascular;  Laterality: Left;  . I&D EXTREMITY Right 07/15/2018   Procedure: IRRIGATION AND DEBRIDEMENT RIGHT FOOT;  Surgeon: [name], DPM;  Location: [place];  Service: Podiatry;  Laterality: Right;  . INCISION AND DRAINAGE ABSCESS / HEMATOMA OF BURSA / KNEE / THIGH Left 2012   "knee" (03/11/2013)  . INSERT / REPLACE / REMOVE PACEMAKER    . INSERTION OF DIALYSIS CATHETER Left 04/22/2018   Procedure: INSERTION OF DIALYSIS CATHETER;  Surgeon: [name],  MD;  Location: [place];  Service: Vascular;  Laterality: Left;  . IR FLUORO GUIDE CV LINE LEFT  07/15/2018  . IR PTA VENOUS EXCEPT DIALYSIS CIRCUIT  07/15/2018  . LOWER EXTREMITY ANGIOGRAPHY Right 07/21/2018   Procedure: LOWER EXTREMITY ANGIOGRAPHY;  Surgeon: [name], MD;  Location: [place] [facility];  Service: Cardiovascular;  Laterality: Right;  . METATARSAL HEAD EXCISION Right 07/15/2018   Procedure: METATARSAL RESECTION;  Surgeon: [name], DPM;  Location: [place];  Service: Podiatry;  Laterality: Right;  . MULTIPLE TOOTH EXTRACTIONS    . PERIPHERAL VASCULAR INTERVENTION Right 07/21/2018   Procedure: PERIPHERAL VASCULAR INTERVENTION;  Surgeon: [name], MD;  Location: [place] [facility];  Service: Cardiovascular;  Laterality: Right;  peroneal stents  . REVISON OF ARTERIOVENOUS FISTULA Right 1/61/0960   Procedure: Plication OF Right Arm ARTERIOVENOUS FISTULA;  Surgeon: Elam Dutch, MD;  Location: [facility];  Service: Vascular;  Laterality: Right;  . REVISON OF ARTERIOVENOUS FISTULA Left 04/22/2018   Procedure: REVISION OF RADIOCEPHALIC ARTERIOVENOUS FISTULA;  Surgeon: [name], MD;  Location: [place];  Service: Vascular;  Laterality: Left;  . SHUNTOGRAM N/A 05/31/2013   Procedure: Earney Mallet;  Surgeon: [name], MD;  Location: [facility] CATH LAB;  Service: Cardiovascular;  Laterality: N/A;  . UPPER EXTREMITY VENOGRAPHY Right 07/23/2018   Procedure: UPPER EXTREMITY VENOGRAPHY;  Surgeon: [name], MD;  Location: [place] [facility];  Service: Cardiovascular;  Laterality: Right;  . WOUND DEBRIDEMENT Right 07/17/2018   Procedure: Wound Debridement; Closure Filleted toe flap Right Foot;  Surgeon: [name], DPM;  Location: [place];  Service: Podiatry;  Laterality: Right;  . WOUND DEBRIDEMENT Right 09/07/2018   Procedure: Debridement Right Foot Wound, application of wound vac;  Surgeon: [name], DPM;  Location: [place];  Service: Podiatry;   Laterality: Right;     A IV Location/Drains/Wounds Patient Lines/Drains/Airways Status   Active Line/Drains/Airways    Name:   Placement date:   Placement time:   Site:   Days:   Peripheral IV 07/22/18 Right Hand   07/22/18    1752    Hand   216   Peripheral IV 02/22/19 Right Hand   02/22/19    1937    Hand   1   Fistula / Graft Right Upper arm Arteriovenous fistula   10/30/15    1340    Upper arm   1212   Fistula / Graft Right Upper arm Arteriovenous fistula   03/26/16    0918    Upper arm   1064   Fistula / Graft Left Forearm Arteriovenous fistula   05/07/16    1056    Forearm   1022   Hemodialysis Catheter Right   03/15/13    1631    Internal jugular   2171   Hemodialysis Catheter Left Subclavian   -    -    Subclavian      Sheath 07/21/18 Left Arterial;Femoral  07/21/18    1036    Arterial;Femoral   217   Sheath 07/21/18 Right Arterial   07/21/18    1105    Arterial   217   Negative Pressure Wound Therapy Foot Right   09/07/18    0802    -   169   Incision (Closed) 10/30/15 Arm Right   10/30/15    1337     1212   Incision (Closed) 03/26/16 Arm Right   03/26/16    0923     1064   Incision (Closed) 05/07/16 Arm Left   05/07/16    1056     1022   Incision (Closed) 04/22/18 Chest Left   04/22/18    1341     307   Incision (Closed) 04/22/18 Arm Left   04/22/18    1341     307   Incision (Closed) 07/15/18 Foot Right   07/15/18    1840     223   Incision (Closed) 07/17/18 Foot Right   07/17/18    0835     221   Incision (Closed) 09/07/18 Foot Right   09/07/18    0819     169          Intake/Output Last 24 hours  Intake/Output Summary (Last 24 hours) at 02/23/2019 0050 Last data filed at 02/22/2019 2325 Gross per 24 hour  Intake 500 ml  Output -  Net 500 ml    Labs/Imaging Results for orders placed or performed during the hospital encounter of 02/22/19 (from the past 48 hour(s))  Comprehensive metabolic panel     Status: Abnormal   Collection Time: 02/22/19  7:00 PM  Result  Value Ref Range   Sodium 132 (L) 135 - 145 mmol/L   Potassium 5.5 (H) 3.5 - 5.1 mmol/L   Chloride 96 (L) 98 - 111 mmol/L   CO2 18 (L) 22 - 32 mmol/L   Glucose, Bld 125 (H) 70 - 99 mg/dL   BUN 50 (H) 8 - 23 mg/dL   Creatinine, Ser 9.64 (H) 0.61 - 1.24 mg/dL   Calcium 9.7 8.9 - 10.3 mg/dL   Total Protein 8.6 (H) 6.5 - 8.1 g/dL   Albumin 3.4 (L) 3.5 - 5.0 g/dL   AST 18 15 - 41 U/L   ALT 16 0 - 44 U/L   Alkaline Phosphatase 58 38 - 126 U/L   Total Bilirubin 0.6 0.3 - 1.2 mg/dL   GFR calc non Af Amer 5 (L) >60 mL/min   GFR calc Af Amer 6 (L) >60 mL/min   Anion gap 18 (H) 5 - 15    Comment: Performed at [facility], [place] [address] [place], [place] [address]  Lactic acid, plasma     Status: Abnormal   Collection Time: 02/22/19  7:00 PM  Result Value Ref Range   Lactic Acid, Venous 3.3 (HH) 0.5 - 1.9 mmol/L    Comment: CRITICAL RESULT CALLED TO, READ BACK BY AND VERIFIED WITH: Assunta Found RN AT [place] [phone] BY Marcos Eke Performed at [facility], [place] [address], [place], [name] [address]   CBC with Differential     Status: Abnormal   Collection Time: 02/22/19  7:00 PM  Result Value Ref Range   WBC 8.9 4.0 - 10.5 K/uL   RBC 4.52 4.22 - 5.81 MIL/uL   Hemoglobin 14.2 13.0 - 17.0 g/dL   HCT 46.6 39.0 - 52.0 %   MCV 103.1 (H) 80.0 -  100.0 fL   MCH 31.4 26.0 - 34.0 pg   MCHC 30.5 30.0 - 36.0 g/dL   RDW 15.6 (H) 11.5 - 15.5 %   Platelets 290 150 - 400 K/uL   nRBC 0.0 0.0 - 0.2 %   Neutrophils Relative % 77 %   Neutro Abs 6.9 1.7 - 7.7 K/uL   Lymphocytes Relative 10 %   Lymphs Abs 0.9 0.7 - 4.0 K/uL   Monocytes Relative 12 %   Monocytes Absolute 1.1 (H) 0.1 - 1.0 K/uL   Eosinophils Relative 0 %   Eosinophils Absolute 0.0 0.0 - 0.5 K/uL   Basophils Relative 0 %   Basophils Absolute 0.0 0.0 - 0.1 K/uL   Immature Granulocytes 1 %   Abs Immature Granulocytes 0.06 0.00 - 0.07 K/uL    Comment: Performed at [facility] [address], [place], [place] [address]   SARS Coronavirus 2 (CEPHEID - Performed in [place] hospital lab), Hosp Order     Status: None   Collection Time: 02/22/19  9:29 PM   Specimen: Nasopharyngeal Swab  Result Value Ref Range   SARS Coronavirus 2 NEGATIVE NEGATIVE    Comment: (NOTE) If result is NEGATIVE SARS-CoV-2 target nucleic acids are NOT DETECTED. The SARS-CoV-2 RNA is generally detectable in upper and lower  respiratory specimens during the acute phase of infection. The lowest  concentration of SARS-CoV-2 viral copies this assay can detect is 250  copies / mL. A negative result does not preclude SARS-CoV-2 infection  and should not be used as the sole basis for treatment or other  patient management decisions.  A negative result may occur with  improper specimen collection / handling, submission of specimen other  than nasopharyngeal swab, presence of viral mutation(s) within the  areas targeted by this assay, and inadequate number of viral copies  (<250 copies / mL). A negative result must be combined with clinical  observations, patient history, and epidemiological information. If result is POSITIVE SARS-CoV-2 target nucleic acids are DETECTED. The SARS-CoV-2 RNA is generally detectable in upper and lower  respiratory specimens dur ing the acute phase of infection.  Positive  results are indicative of active infection with SARS-CoV-2.  Clinical  correlation with patient history and other diagnostic information is  necessary to determine patient infection status.  Positive results do  not rule out bacterial infection or co-infection with other viruses. If result is PRESUMPTIVE POSTIVE SARS-CoV-2 nucleic acids MAY BE PRESENT.   A presumptive positive result was obtained on the submitted specimen  and confirmed on repeat testing.  While 2019 novel coronavirus  (SARS-CoV-2) nucleic acids may be present in the submitted sample  additional confirmatory testing may be necessary for epidemiological  and / or  clinical management purposes  to differentiate between  SARS-CoV-2 and other Sarbecovirus currently known to infect humans.  If clinically indicated additional testing with an alternate test  methodology [phone]) is advised. The SARS-CoV-2 RNA is generally  detectable in upper and lower respiratory sp ecimens during the acute  phase of infection. The expected result is Negative. Fact Sheet for Patients:  StrictlyIdeas.no Fact Sheet for Healthcare Providers: BankingDealers.co.za This test is not yet approved or cleared by the [place] FDA and has been authorized for detection and/or diagnosis of SARS-CoV-2 by FDA under an Emergency Use Authorization (EUA).  This EUA will remain in effect (meaning this test can be used) for the duration of the COVID-19 declaration under Section 564(b)(1) of the Act, 21 U.S.C.  section 360bbb-3(b)(1), unless the authorization is terminated or revoked sooner. Performed at [facility], [place] [address], [place], [place] [address]   Lactic acid, plasma     Status: None   Collection Time: 02/22/19  9:39 PM  Result Value Ref Range   Lactic Acid, Venous 0.6 0.5 - 1.9 mmol/L    Comment: Performed at [facility] [address], [place], [place] [address]   Dg Foot Complete Left  Result Date: 02/22/2019 CLINICAL DATA:  Right foot wound EXAM: LEFT FOOT - COMPLETE 3+ VIEW COMPARISON:  07/13/2018 FINDINGS: Vascular calcifications. Small plantar calcaneal spur. No soft tissue emphysema. No acute fracture. Mild degenerative change at the first MTP joint. Marginal erosive changes at the head of the first proximal phalanx and the head of the first metatarsal presumably related to history of gout. IMPRESSION: 1. No acute osseous abnormality. 2. Findings felt consistent with history of gout at the first digit. Electronically Signed   By: Donavan Foil M.D.   On: 02/22/2019 20:57   Dg Foot Complete  Left  Result Date: 02/22/2019 Please see detailed radiograph report in office note.  Dg Foot Complete Right  Result Date: 02/22/2019 CLINICAL DATA:  Right foot wound EXAM: RIGHT FOOT COMPLETE - 3+ VIEW COMPARISON:  01/13/2019, 09/07/2018 FINDINGS: Chronic fracture deformity of the first proximal phalanx. Ununited fracture deformity proximal shaft fourth metatarsal with possible increased bone resorption at the fracture cleft. Prior amputation fifth digit at the base of fifth metatarsal without definitive acute bony destructive change. No soft tissue emphysema. IMPRESSION: 1. Prior amputation of the fifth digit at the level of the base of fifth metatarsal without definitive acute osseous changes of the remnant digit 2. Ununited fracture proximal shaft fourth metatarsal, seen on prior radiograph from June 2020. There may be increased osteolysis at the fracture cleft Electronically Signed   By: Donavan Foil M.D.   On: 02/22/2019 21:01    Pending Labs Unresulted Labs (From admission, onward)    Start     Ordered   02/22/19 2000  Blood culture (routine x 2)  BLOOD CULTURE X 2,   STAT     02/22/19 2001          Vitals/Pain Today's Vitals   02/22/19 2100 02/22/19 2130 02/22/19 2200 02/22/19 2315  BP: (!) 80/69 (!) 89/67 (!) 80/63 96/73  Pulse: (!) 110 (!) 102 95 93  Resp: (!) 21 (!) 24 18 18   Temp:      TempSrc:      SpO2: 94% 96% 95% 99%  Weight:      Height:      PainSc:        Isolation Precautions No active isolations  Medications Medications  vancomycin variable dose per unstable renal function (pharmacist dosing) (has no administration in time range)  vancomycin (VANCOCIN) 1,750 mg in sodium chloride 0.9 % 500 mL IVPB (0 mg Intravenous Stopped 02/22/19 2325)    Mobility walks with device High fall risk   Focused Assessments Musculoskeletal   R Recommendations: See Admitting Provider Note  Report given to:   Additional Notes: Pt is A&O x4.  Denies any pain.  First  dose of Vancomycin given

## 2019-02-23 NOTE — Progress Notes (Signed)
Renal Navigator notified OP HD clinic/[facility] of patient's admission and negative COVID 19 rapid test result in order to provide continuity of care.  Alphonzo Cruise, [place] Renal Navigator [phone]

## 2019-02-23 NOTE — H&P (Signed)
History and Physical    [name] FTD:[id number] DOB: October 20, 1945 DOA: 02/22/2019  PCP: [name], MD  Patient coming from: Home.  Chief Complaint: Left foot third and fourth toe discharge.  HPI: Timothy Faulkner is a 73 y.o. male with history of ESRD on hemodialysis Monday Wednesday Friday, chronic systolic heart failure status post ICD, peripheral vascular disease status post stent placement last December 2019 with history of osteomyelitis at the same time had undergone fifth toe amputation has been noticing discoloration of his third toe over the last 1 month which has worsened and also has noted increasing discoloration on the fourth toe now on his left foot.  Last 24 hours noted increased discharge from those toes had gone to his podiatry office and was referred to the ER.  Patient denies any fever chills nausea vomiting chest pain or shortness of breath.  ED Course: In the ER x-rays do not show any acute findings.  On exam pulses are not felt but lower leg is not cold.  Patient is hypotensive and is chronically hypotensive and is on midodrine.  Blood cultures were obtained and started on empiric antibiotics.  Labs show initially elevated lactate which improved without any intervention.  Potassium was five-point 5 repeat was 5.  Hemoglobin 14 platelets 290.  Review of Systems: As per HPI, rest all negative.   Past Medical History:  Diagnosis Date  . Anemia   . Arthritis    Gout  . Automatic implantable cardioverter-defibrillator in situ    [employer]  . CHF   . ESRD on dialysis [facility])    [name] 03/11/2013 (03/11/2013) dialysis M/W/F  . GERD (gastroesophageal reflux disease)   . Gout    "once a year"  . Heart murmur   . HYPERCHOLESTEROLEMIA, MIXED   . HYPERTENSION   . Macular degeneration   . Osteomyelitis ([place])    right foot  . 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 Pacemaker   . Peripheral arterial disease (HCC)    left fifth toe ulcer, healing  .  Pneumonia   . Shortness of breath   . 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 Wears dentures   . Wears glasses     Past Surgical History:  Procedure Laterality Date  . A/V FISTULAGRAM Left 07/20/2018   Procedure: A/V FISTULAGRAM;  Surgeon: [name], MD;  Location: [place] [facility];  Service: Cardiovascular;  Laterality: Left;  . ABDOMINAL AORTOGRAM W/LOWER EXTREMITY Bilateral 07/20/2018   Procedure: ABDOMINAL AORTOGRAM W/LOWER EXTREMITY;  Surgeon: [name], MD;  Location: [place] [facility];  Service: Cardiovascular;  Laterality: Bilateral;  . AMPUTATION Right 09/07/2018   Procedure: Right fifth metatarsectomy;  Surgeon: [name], DPM;  Location: [place];  Service: Podiatry;  Laterality: Right;  . AV FISTULA PLACEMENT Right 12/13/2012   Procedure: ARTERIOVENOUS (AV) FISTULA CREATION;  Surgeon: [name], MD;  Location: [place];  Service: Vascular;  Laterality: Right;  Ultrasound guided  . AV FISTULA PLACEMENT Left 05/07/2016   Procedure: LEFT RADIOCEPHALIC ARTERIOVENOUS (AV) FISTULA CREATION;  Surgeon: [name], MD;  Location: [place];  Service: Vascular;  Laterality: Left;  . BASCILIC VEIN TRANSPOSITION Right 03/26/2016   Procedure: RIGHT BASILIC VEIN TRANSPOSITION;  Surgeon: [name], MD;  Location: [place];  Service: Vascular;  Laterality: Right;  . CARDIAC CATHETERIZATION    . CARDIAC DEFIBRILLATOR PLACEMENT     Chemical engineer  .  EYE SURGERY Bilateral    Cataract  . FISTULOGRAM Left 04/22/2018   Procedure: FISTULOGRAM UPPER EXTREMITY;  Surgeon: [name], MD;  Location: [place];  Service: Vascular;  Laterality: Left;  . I&D EXTREMITY Right 07/15/2018   Procedure: IRRIGATION AND DEBRIDEMENT RIGHT FOOT;  Surgeon: [name], DPM;  Location: [place];  Service: Podiatry;  Laterality: Right;  . INCISION AND DRAINAGE ABSCESS / HEMATOMA OF BURSA / KNEE / THIGH Left 2012   "knee" (03/11/2013)  . INSERT /  REPLACE / REMOVE PACEMAKER    . INSERTION OF DIALYSIS CATHETER Left 04/22/2018   Procedure: INSERTION OF DIALYSIS CATHETER;  Surgeon: [name], MD;  Location: [place];  Service: Vascular;  Laterality: Left;  . IR FLUORO GUIDE CV LINE LEFT  07/15/2018  . IR PTA VENOUS EXCEPT DIALYSIS CIRCUIT  07/15/2018  . LOWER EXTREMITY ANGIOGRAPHY Right 07/21/2018   Procedure: LOWER EXTREMITY ANGIOGRAPHY;  Surgeon: [name], MD;  Location: [place] [facility];  Service: Cardiovascular;  Laterality: Right;  . METATARSAL HEAD EXCISION Right 07/15/2018   Procedure: METATARSAL RESECTION;  Surgeon: [name], DPM;  Location: [place];  Service: Podiatry;  Laterality: Right;  . MULTIPLE TOOTH EXTRACTIONS    . PERIPHERAL VASCULAR INTERVENTION Right 07/21/2018   Procedure: PERIPHERAL VASCULAR INTERVENTION;  Surgeon: [name], MD;  Location: [place] [facility];  Service: Cardiovascular;  Laterality: Right;  peroneal stents  . REVISON OF ARTERIOVENOUS FISTULA Right 1/96/2229   Procedure: Plication OF Right Arm ARTERIOVENOUS FISTULA;  Surgeon: Elam Dutch, MD;  Location: [facility] OR;  Service: Vascular;  Laterality: Right;  . REVISON OF ARTERIOVENOUS FISTULA Left 04/22/2018   Procedure: REVISION OF RADIOCEPHALIC ARTERIOVENOUS FISTULA;  Surgeon: [name], MD;  Location: [place];  Service: Vascular;  Laterality: Left;  . SHUNTOGRAM N/A 05/31/2013   Procedure: Earney Mallet;  Surgeon: [name], MD;  Location: [facility] CATH LAB;  Service: Cardiovascular;  Laterality: N/A;  . UPPER EXTREMITY VENOGRAPHY Right 07/23/2018   Procedure: UPPER EXTREMITY VENOGRAPHY;  Surgeon: [name], MD;  Location: [place] [facility];  Service: Cardiovascular;  Laterality: Right;  . WOUND DEBRIDEMENT Right 07/17/2018   Procedure: Wound Debridement; Closure Filleted toe flap Right Foot;  Surgeon: [name], DPM;  Location: [place];  Service: Podiatry;  Laterality: Right;  . WOUND DEBRIDEMENT  Right 09/07/2018   Procedure: Debridement Right Foot Wound, application of wound vac;  Surgeon: [name], DPM;  Location: [place];  Service: Podiatry;  Laterality: Right;     reports that he quit smoking about 26 years ago. His smoking use included cigarettes. He has a 22.50 pack-year smoking history. He has never used smokeless tobacco. He reports previous alcohol use. He reports previous drug use. Drug: Marijuana.  No Known Allergies  Family History  Problem Relation Age of Onset  . Heart disease Father   . CAD Father     Prior to Admission medications   Medication Sig Start Date End Date Taking? Authorizing Provider  acetaminophen (TYLENOL) 500 MG tablet Take 500 mg by mouth every 6 (six) hours as needed for mild pain or headache.   Yes [provider]  aspirin EC 81 MG tablet Take 81 mg by mouth daily.   Yes [provider]  AURYXIA 1 GM 210 MG(Fe) tablet Take 420 mg by mouth 2 (two) times a day. Morning and Evening 11/12/16  Yes [provider]  calcitRIOL (ROCALTROL) 0.5 MCG capsule Take 2  capsules (1 mcg total) by mouth 3 (three) times a week. Patient taking differently: Take 0.5 mcg by mouth every Monday, Wednesday, and Friday with hemodialysis.  07/26/18  Yes Kayleen Memos, DO  clopidogrel (PLAVIX) 75 MG tablet Take 1 tablet (75 mg total) by mouth daily with breakfast. 07/23/18  Yes Irene Pap N, DO  colchicine 0.6 MG tablet Take 0.6 mg by mouth daily as needed (for gout flare ups).  02/26/18  Yes [provider]  collagenase (SANTYL) ointment Apply 1 application topically daily. Apply to right foot wound daily cover with dry dressing. 07/27/18  Yes [name], DPM  lidocaine-prilocaine (EMLA) cream Apply 1 application topically See admin instructions. Apply small amount to access site 1 to 2 hours before dialysis. 04/12/18  Yes [provider]  midodrine (PROAMATINE) 10 MG tablet Take 10 mg by mouth every Monday, Wednesday, and  Friday with hemodialysis.  04/26/18  Yes [provider]  oxyCODONE (ROXICODONE) 5 MG immediate release tablet Take 1 tablet (5 mg total) by mouth every 8 (eight) hours as needed for severe pain. 09/07/18 09/07/19 Yes [name], DPM  SENSIPAR 30 MG tablet Take 30 mg by mouth every Monday, Wednesday, and Friday with hemodialysis.    Yes [provider]  clindamycin (CLEOCIN) 150 MG capsule Take 1 capsule (150 mg total) by mouth 2 (two) times daily. Patient not taking: Reported on 02/22/2019 09/07/18   [name], DPM  collagenase (SANTYL) ointment Apply 1 application topically daily. Patient not taking: Reported on 02/22/2019 09/23/18   [name], DPM  gabapentin (NEURONTIN) 100 MG capsule TAKE 1 CAPSULE BY MOUTH AT BEDTIME. Patient taking differently: Take 100 mg by mouth at bedtime.  10/28/18   Cameron Sprang, MD  multivitamin (RENA-VIT) TABS tablet Take 1 tablet by mouth at bedtime. Patient not taking: Reported on 02/22/2019 07/23/18   Kayleen Memos, DO  pantoprazole (PROTONIX) 40 MG tablet Take 1 tablet (40 mg total) by mouth daily at 6 (six) AM. Patient not taking: Reported on 02/22/2019 07/24/18   Kayleen Memos, DO  SANTYL ointment APPLY TOPICALLY TO RIGHT FOOT WOUND DAILY, COVER WITH DRY DRESSING Patient not taking: Apply topically to right foot wound daily and cover with dry dressing 11/15/18   [name], DPM    Physical Exam: Constitutional: Moderately built and nourished. Vitals:   02/22/19 2100 02/22/19 2130 02/22/19 2200 02/22/19 2315  BP: (!) 80/69 (!) 89/67 (!) 80/63 96/73  Pulse: (!) 110 (!) 102 95 93  Resp: (!) 21 (!) 24 18 18   Temp:      TempSrc:      SpO2: 94% 96% 95% 99%  Weight:      Height:       Eyes: Anicteric no pallor. ENMT: No discharge from the ears eyes nose or mouth. Neck: No mass felt.  No neck rigidity. Respiratory: No rhonchi or crepitations. Cardiovascular: S1-S2 heard. Abdomen: Soft nontender bowel sounds present.  Musculoskeletal: Poor pulses in the extremities. Skin: Gangrenous looking toe of the left foot third and fourth toe. Neurologic: Alert awake oriented to time place and person.  Moves all extremities. Psychiatric: Appears normal per normal affect.   Labs on Admission: I have personally reviewed following labs and imaging studies  CBC: Recent Labs  Lab 02/22/19 1900  WBC 8.9  NEUTROABS 6.9  HGB 14.2  HCT 46.6  MCV 103.1*  PLT 709   Basic Metabolic Panel: Recent Labs  Lab 02/22/19 1900  NA 132*  K 5.5*  CL 96*  CO2 18*  GLUCOSE 125*  BUN 50*  CREATININE 9.64*  CALCIUM 9.7   GFR: Estimated Creatinine Clearance: 6.7 mL/min (A) (by C-G formula based on SCr of 9.64 mg/dL (H)). Liver Function Tests: Recent Labs  Lab 02/22/19 1900  AST 18  ALT 16  ALKPHOS 58  BILITOT 0.6  PROT 8.6*  ALBUMIN 3.4*   No results for input(s): LIPASE, AMYLASE in the last 168 hours. No results for input(s): AMMONIA in the last 168 hours. Coagulation Profile: No results for input(s): INR, PROTIME in the last 168 hours. Cardiac Enzymes: No results for input(s): CKTOTAL, CKMB, CKMBINDEX, TROPONINI in the last 168 hours. BNP (last 3 results) No results for input(s): PROBNP in the last 8760 hours. HbA1C: No results for input(s): HGBA1C in the last 72 hours. CBG: No results for input(s): GLUCAP in the last 168 hours. Lipid Profile: No results for input(s): CHOL, HDL, LDLCALC, TRIG, CHOLHDL, LDLDIRECT in the last 72 hours. Thyroid Function Tests: No results for input(s): TSH, T4TOTAL, FREET4, T3FREE, THYROIDAB in the last 72 hours. Anemia Panel: No results for input(s): VITAMINB12, FOLATE, FERRITIN, TIBC, IRON, RETICCTPCT in the last 72 hours. Urine analysis:    Component Value Date/Time   COLORURINE YELLOW 03/11/2013 1548   APPEARANCEUR CLOUDY (A) 03/11/2013 1548   LABSPEC 1.017 03/11/2013 1548   PHURINE 5.0 03/11/2013 1548   GLUCOSEU NEGATIVE 03/11/2013 1548   HGBUR NEGATIVE  03/11/2013 1548   BILIRUBINUR NEGATIVE 03/11/2013 1548   KETONESUR NEGATIVE 03/11/2013 1548   PROTEINUR >300 (A) 03/11/2013 1548   UROBILINOGEN 1.0 03/11/2013 1548   NITRITE NEGATIVE 03/11/2013 1548   LEUKOCYTESUR SMALL (A) 03/11/2013 1548   Sepsis Labs: @LABRCNTIP (procalcitonin:4,lacticidven:4) ) Recent Results (from the past 240 hour(s))  SARS Coronavirus 2 (CEPHEID - Performed in [place] hospital lab), Hosp Order     Status: None   Collection Time: 02/22/19  9:29 PM   Specimen: Nasopharyngeal Swab  Result Value Ref Range Status   SARS Coronavirus 2 NEGATIVE NEGATIVE Final    Comment: (NOTE) If result is NEGATIVE SARS-CoV-2 target nucleic acids are NOT DETECTED. The SARS-CoV-2 RNA is generally detectable in upper and lower  respiratory specimens during the acute phase of infection. The lowest  concentration of SARS-CoV-2 viral copies this assay can detect is 250  copies / mL. A negative result does not preclude SARS-CoV-2 infection  and should not be used as the sole basis for treatment or other  patient management decisions.  A negative result may occur with  improper specimen collection / handling, submission of specimen other  than nasopharyngeal swab, presence of viral mutation(s) within the  areas targeted by this assay, and inadequate number of viral copies  (<250 copies / mL). A negative result must be combined with clinical  observations, patient history, and epidemiological information. If result is POSITIVE SARS-CoV-2 target nucleic acids are DETECTED. The SARS-CoV-2 RNA is generally detectable in upper and lower  respiratory specimens dur ing the acute phase of infection.  Positive  results are indicative of active infection with SARS-CoV-2.  Clinical  correlation with patient history and other diagnostic information is  necessary to determine patient infection status.  Positive results do  not rule out bacterial infection or co-infection with other viruses.  If result is PRESUMPTIVE POSTIVE SARS-CoV-2 nucleic acids MAY BE PRESENT.   A presumptive positive result was obtained on the submitted specimen  and confirmed on repeat testing.  While 2019 novel coronavirus  (  SARS-CoV-2) nucleic acids may be present in the submitted sample  additional confirmatory testing may be necessary for epidemiological  and / or clinical management purposes  to differentiate between  SARS-CoV-2 and other Sarbecovirus currently known to infect humans.  If clinically indicated additional testing with an alternate test  methodology [phone]) is advised. The SARS-CoV-2 RNA is generally  detectable in upper and lower respiratory sp ecimens during the acute  phase of infection. The expected result is Negative. Fact Sheet for Patients:  StrictlyIdeas.no Fact Sheet for Healthcare Providers: BankingDealers.co.za This test is not yet approved or cleared by the [place] FDA and has been authorized for detection and/or diagnosis of SARS-CoV-2 by FDA under an Emergency Use Authorization (EUA).  This EUA will remain in effect (meaning this test can be used) for the duration of the COVID-19 declaration under Section 564(b)(1) of the Act, 21 U.S.C. section 360bbb-3(b)(1), unless the authorization is terminated or revoked sooner. Performed at [facility], [place] [address], [place], [place] [address]      Radiological Exams on Admission: Dg Foot Complete Left  Result Date: 02/22/2019 CLINICAL DATA:  Right foot wound EXAM: LEFT FOOT - COMPLETE 3+ VIEW COMPARISON:  07/13/2018 FINDINGS: Vascular calcifications. Small plantar calcaneal spur. No soft tissue emphysema. No acute fracture. Mild degenerative change at the first MTP joint. Marginal erosive changes at the head of the first proximal phalanx and the head of the first metatarsal presumably related to history of gout. IMPRESSION: 1. No acute osseous abnormality. 2.  Findings felt consistent with history of gout at the first digit. Electronically Signed   By: Donavan Foil M.D.   On: 02/22/2019 20:57   Dg Foot Complete Left  Result Date: 02/22/2019 Please see detailed radiograph report in office note.  Dg Foot Complete Right  Result Date: 02/22/2019 CLINICAL DATA:  Right foot wound EXAM: RIGHT FOOT COMPLETE - 3+ VIEW COMPARISON:  01/13/2019, 09/07/2018 FINDINGS: Chronic fracture deformity of the first proximal phalanx. Ununited fracture deformity proximal shaft fourth metatarsal with possible increased bone resorption at the fracture cleft. Prior amputation fifth digit at the base of fifth metatarsal without definitive acute bony destructive change. No soft tissue emphysema. IMPRESSION: 1. Prior amputation of the fifth digit at the level of the base of fifth metatarsal without definitive acute osseous changes of the remnant digit 2. Ununited fracture proximal shaft fourth metatarsal, seen on prior radiograph from June 2020. There may be increased osteolysis at the fracture cleft Electronically Signed   By: Donavan Foil M.D.   On: 02/22/2019 21:01     Assessment/Plan Principal Problem:   Cellulitis of left foot Active Problems:   Automatic implantable cardioverter-defibrillator in situ   ESRD (end stage renal disease) on dialysis ([place])   Anemia associated with chronic renal failure   Cardiomyopathy, dilated, nonischemic (HCC)   Gangrene of toe of left foot (HCC)   Hypotension    1. Cellulitis with gangrenous looking left foot third and fourth toe -will consult vascular surgery patient is on empiric antibiotics for now. 2. History of peripheral vascular disease status post stent placement in December 2019 for right lower extremity.  On aspirin Plavix. 3. Chronic hypotension on midodrine. 4. ESRD on hemodialysis on Monday Wednesday Friday.  Consult nephrology. 5. Chronic systolic heart failure status post ICD placement. 6. History of diabetes presently  on diet.  Given the complexity of case including poor pulses with gangrenous looking toes and infection with other comorbidities patient will require inpatient status.  DVT prophylaxis: SCDs in anticipation of procedure. Code Status: Full code. Family Communication: We will need to discuss with family. Disposition Plan: Home. Consults called: We will consult vascular surgery. Admission status: Inpatient.   Rise Patience MD [facility] Pager [phone].  If 7PM-7AM, please contact night-coverage [email or web address] Password [email or web address]  02/23/2019, 1:07 AM

## 2019-02-23 NOTE — Progress Notes (Signed)
  Subjective:  Patient ID: Timothy Faulkner, male    DOB: [birth year],  MRN: [id number]  Patient seen at bedside. Denies complaints. Pain controlled. Sent by Dr. Cannon Kettle to the ED yesterday after 1 day history of darkening toes to the left foot. Objective:   Vitals:   02/23/19 1530 02/23/19 1600  BP: (!) 85/73 (!) 148/122  Pulse: (!) 102 89  Resp: 17 18  Temp:    SpO2:     General AA&O x3. Normal mood and affect.  Vascular Dorsalis pedis and posterior tibial pulses non-palpable. Absent pedal hair. Pallor left 3rd/4th toes.  Neurologic Epicritic sensation grossly diminished.  Dermatologic Right foot warm, with wound with granular wound base. Dry, no active drainage. Minimal hyperkeratosis. Prior 5th toe amputation.  Left 3rd and 4th toe ischemic toes with dry gangrenous eschars dorsally. Malodorous. No active drainage. No signs of acute infection.  Orthopedic: Calves supple, no tenderness. Positive motor to level of the digits.    Assessment & Plan:  Patient was evaluated and treated and all questions answered.  Gangrene Left 3rd/4th Toes -Plain films reviewed, no signs of osteomyelitis. -Await vascular intervention. Will need amputation of the digits likely after vascular intervention.  -Dressed with betadine and dry gauze. Recc be applied daily. Allow toes to demarcate. -WBAT in surgical shoe  Right Diabetic Foot Ulceration s/p partial 5th Ray Ampuation -Recc Santyl WTD to be applied daily. Dressed with aquacell today. -XR reviewed. Erosions noted 4th metatarsal but appears rather stable. No sign of acute infection clinically. -WBAT in surgical shoe.  [name], DPM  Accessible via secure chat for questions or concerns.

## 2019-02-23 NOTE — Consult Note (Addendum)
Hospital Consult    Reason for Consult: Gangrenous toes left foot Requesting Physician:  [facility] MRN #:  [id number]  History of Present Illness: This is a 73 y.o. male with past medical history significant for end-stage renal disease on hemodialysis via left IJ TDC, CHF, hypertension, hyperlipidemia, diabetes mellitus.  He is being seen in consultation for evaluation of gangrenous toes with purulent discharge of left foot.  Surgical history significant for right fifth toe amputation and right TP trunk and peroneal stenting by Dr. Carlis Abbott via retrograde peroneal artery approach in December 2019.  At that time left lower extremity runoff demonstrated patent SFA, popliteal, and occlusion of all 3 tibial vessels.  He states the wounds of his left third and fourth toes have worsened over the past week.  He is taking aspirin and Plavix daily.  He is ambulatory with a walker.  He has been started on IV antibiotics.  No findings of osteomyelitis on plain films.  Past Medical History:  Diagnosis Date  . Anemia   . Arthritis    Gout  . Automatic implantable cardioverter-defibrillator in situ    [employer]  . CHF   . ESRD on dialysis [facility])    [name] 03/11/2013 (03/11/2013) dialysis M/W/F  . GERD (gastroesophageal reflux disease)   . Gout    "once a year"  . Heart murmur   . HYPERCHOLESTEROLEMIA, MIXED   . HYPERTENSION   . Macular degeneration   . Osteomyelitis ([place])    right foot  . 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 Pacemaker   . Peripheral arterial disease (HCC)    left fifth toe ulcer, healing  . Pneumonia   . Shortness of breath   . 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 Wears dentures   . Wears glasses     Past Surgical History:  Procedure Laterality Date  . A/V FISTULAGRAM Left 07/20/2018   Procedure: A/V FISTULAGRAM;  Surgeon: [name], MD;  Location: [place] [facility];  Service: Cardiovascular;  Laterality: Left;  .  ABDOMINAL AORTOGRAM W/LOWER EXTREMITY Bilateral 07/20/2018   Procedure: ABDOMINAL AORTOGRAM W/LOWER EXTREMITY;  Surgeon: [name], MD;  Location: [place] [facility];  Service: Cardiovascular;  Laterality: Bilateral;  . AMPUTATION Right 09/07/2018   Procedure: Right fifth metatarsectomy;  Surgeon: [name], DPM;  Location: [place];  Service: Podiatry;  Laterality: Right;  . AV FISTULA PLACEMENT Right 12/13/2012   Procedure: ARTERIOVENOUS (AV) FISTULA CREATION;  Surgeon: [name], MD;  Location: [place];  Service: Vascular;  Laterality: Right;  Ultrasound guided  . AV FISTULA PLACEMENT Left 05/07/2016   Procedure: LEFT RADIOCEPHALIC ARTERIOVENOUS (AV) FISTULA CREATION;  Surgeon: [name], MD;  Location: [place];  Service: Vascular;  Laterality: Left;  . BASCILIC VEIN TRANSPOSITION Right 03/26/2016   Procedure: RIGHT BASILIC VEIN TRANSPOSITION;  Surgeon: [name], MD;  Location: [place];  Service: Vascular;  Laterality: Right;  . CARDIAC CATHETERIZATION    . CARDIAC DEFIBRILLATOR PLACEMENT     Chemical engineer  . EYE SURGERY Bilateral    Cataract  . FISTULOGRAM Left 04/22/2018   Procedure: FISTULOGRAM UPPER EXTREMITY;  Surgeon: [name], MD;  Location: [place];  Service: Vascular;  Laterality: Left;  . I&D EXTREMITY Right 07/15/2018   Procedure: IRRIGATION AND DEBRIDEMENT RIGHT FOOT;  Surgeon: [name], DPM;  Location: [place];  Service: Podiatry;  Laterality: Right;  . INCISION AND  DRAINAGE ABSCESS / HEMATOMA OF BURSA / KNEE / THIGH Left 2012   "knee" (03/11/2013)  . INSERT / REPLACE / REMOVE PACEMAKER    . INSERTION OF DIALYSIS CATHETER Left 04/22/2018   Procedure: INSERTION OF DIALYSIS CATHETER;  Surgeon: [name], MD;  Location: [place];  Service: Vascular;  Laterality: Left;  . IR FLUORO GUIDE CV LINE LEFT  07/15/2018  . IR PTA VENOUS EXCEPT DIALYSIS CIRCUIT  07/15/2018  . LOWER EXTREMITY ANGIOGRAPHY Right 07/21/2018   Procedure: LOWER EXTREMITY  ANGIOGRAPHY;  Surgeon: [name], MD;  Location: [place] [facility];  Service: Cardiovascular;  Laterality: Right;  . METATARSAL HEAD EXCISION Right 07/15/2018   Procedure: METATARSAL RESECTION;  Surgeon: [name], DPM;  Location: [place];  Service: Podiatry;  Laterality: Right;  . MULTIPLE TOOTH EXTRACTIONS    . PERIPHERAL VASCULAR INTERVENTION Right 07/21/2018   Procedure: PERIPHERAL VASCULAR INTERVENTION;  Surgeon: [name], MD;  Location: [place] [facility];  Service: Cardiovascular;  Laterality: Right;  peroneal stents  . REVISON OF ARTERIOVENOUS FISTULA Right 01/13/5408   Procedure: Plication OF Right Arm ARTERIOVENOUS FISTULA;  Surgeon: Elam Dutch, MD;  Location: [facility] OR;  Service: Vascular;  Laterality: Right;  . REVISON OF ARTERIOVENOUS FISTULA Left 04/22/2018   Procedure: REVISION OF RADIOCEPHALIC ARTERIOVENOUS FISTULA;  Surgeon: [name], MD;  Location: [place];  Service: Vascular;  Laterality: Left;  . SHUNTOGRAM N/A 05/31/2013   Procedure: Earney Mallet;  Surgeon: [name], MD;  Location: [facility] CATH LAB;  Service: Cardiovascular;  Laterality: N/A;  . UPPER EXTREMITY VENOGRAPHY Right 07/23/2018   Procedure: UPPER EXTREMITY VENOGRAPHY;  Surgeon: [name], MD;  Location: [place] [facility];  Service: Cardiovascular;  Laterality: Right;  . WOUND DEBRIDEMENT Right 07/17/2018   Procedure: Wound Debridement; Closure Filleted toe flap Right Foot;  Surgeon: [name], DPM;  Location: [place];  Service: Podiatry;  Laterality: Right;  . WOUND DEBRIDEMENT Right 09/07/2018   Procedure: Debridement Right Foot Wound, application of wound vac;  Surgeon: [name], DPM;  Location: [place];  Service: Podiatry;  Laterality: Right;    No Known Allergies  Prior to Admission medications   Medication Sig Start Date End Date Taking? Authorizing Provider  acetaminophen (TYLENOL) 500 MG tablet Take 500 mg by mouth every 6 (six) hours as needed  for mild pain or headache.   Yes [provider]  aspirin EC 81 MG tablet Take 81 mg by mouth daily.   Yes [provider]  AURYXIA 1 GM 210 MG(Fe) tablet Take 420 mg by mouth 2 (two) times a day. Morning and Evening 11/12/16  Yes [provider]  calcitRIOL (ROCALTROL) 0.5 MCG capsule Take 2 capsules (1 mcg total) by mouth 3 (three) times a week. Patient taking differently: Take 0.5 mcg by mouth every Monday, Wednesday, and Friday with hemodialysis.  07/26/18  Yes Kayleen Memos, DO  clopidogrel (PLAVIX) 75 MG tablet Take 1 tablet (75 mg total) by mouth daily with breakfast. 07/23/18  Yes Irene Pap N, DO  colchicine 0.6 MG tablet Take 0.6 mg by mouth daily as needed (for gout flare ups).  02/26/18  Yes [provider]  collagenase (SANTYL) ointment Apply 1 application topically daily. Apply to right foot wound daily cover with dry dressing. 07/27/18  Yes [name], DPM  lidocaine-prilocaine (EMLA) cream Apply 1 application topically See admin instructions. Apply small amount to access site 1 to 2 hours before dialysis.  04/12/18  Yes [provider]  midodrine (PROAMATINE) 10 MG tablet Take 10 mg by mouth every Monday, Wednesday, and Friday with hemodialysis.  04/26/18  Yes [provider]  oxyCODONE (ROXICODONE) 5 MG immediate release tablet Take 1 tablet (5 mg total) by mouth every 8 (eight) hours as needed for severe pain. 09/07/18 09/07/19 Yes [name], DPM  SENSIPAR 30 MG tablet Take 30 mg by mouth every Monday, Wednesday, and Friday with hemodialysis.    Yes [provider]  clindamycin (CLEOCIN) 150 MG capsule Take 1 capsule (150 mg total) by mouth 2 (two) times daily. Patient not taking: Reported on 02/22/2019 09/07/18   [name], DPM  collagenase (SANTYL) ointment Apply 1 application topically daily. Patient not taking: Reported on 02/22/2019 09/23/18   [name], DPM  gabapentin (NEURONTIN) 100 MG capsule  TAKE 1 CAPSULE BY MOUTH AT BEDTIME. Patient taking differently: Take 100 mg by mouth at bedtime.  10/28/18   Cameron Sprang, MD  multivitamin (RENA-VIT) TABS tablet Take 1 tablet by mouth at bedtime. Patient not taking: Reported on 02/22/2019 07/23/18   Kayleen Memos, DO  pantoprazole (PROTONIX) 40 MG tablet Take 1 tablet (40 mg total) by mouth daily at 6 (six) AM. Patient not taking: Reported on 02/22/2019 07/24/18   Kayleen Memos, DO  SANTYL ointment APPLY TOPICALLY TO RIGHT FOOT WOUND DAILY, COVER WITH DRY DRESSING Patient not taking: Apply topically to right foot wound daily and cover with dry dressing 11/15/18   [name], DPM    Social History   Socioeconomic History  . Marital status: Married    Spouse name: [name]  . Number of children: 2  . Years of education: 56  . Highest education level: Not on file  Occupational History  . Occupation: diasbled  Social Needs  . Financial resource strain: Not on file  . Food insecurity    Worry: Not on file    Inability: Not on file  . Transportation needs    Medical: Not on file    Non-medical: Not on file  Tobacco Use  . Smoking status: Former Smoker    Packs/day: 0.75    Years: 30.00    Pack years: 22.50    Types: Cigarettes    Quit date: 11/25/1992    Years since quitting: 26.2  . Smokeless tobacco: Never Used  Substance and Sexual Activity  . Alcohol use: Not Currently  . Drug use: Not Currently    Types: Marijuana  . Sexual activity: Yes  Lifestyle  . Physical activity    Days per week: Not on file    Minutes per session: Not on file  . Stress: Not on file  Relationships  . Social Herbalist on phone: Not on file    Gets together: Not on file    Attends religious service: Not on file    Active member of club or organization: Not on file    Attends meetings of clubs or organizations: Not on file    Relationship status: Not on file  . Intimate partner violence    Fear of current or ex partner: Not  on file    Emotionally abused: Not on file    Physically abused: Not on file    Forced sexual activity: Not on file  Other Topics Concern  . 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 Heart disease Father   . CAD Father     ROS: Otherwise negative unless mentioned in HPI  Physical Examination  Vitals:   02/23/19 0351 02/23/19 0736  BP: 106/79 105/71  Pulse:  90  Resp: (!) 22 16  Temp: 98.1 F (36.7 C) 98.3 F (36.8 C)  SpO2: 99% 95%   Body mass index is 26.62 kg/m.  General:  WDWN in NAD Gait: Not observed HENT: WNL, normocephalic Pulmonary: normal non-labored breathing Cardiac: regular Abdomen:  soft, NT/ND, no masses Skin: without rashes Vascular Exam/Pulses: Symmetrical femoral pulses, unable to palpate popliteal pulses, right peroneal signal and soft AT signal; left AT signal diminished by Doppler Extremities: Slow to heal right fifth toe amputation site however appears clean with no frank sign of infection; left third and fourth toes gangrenous with odor no drainage noted on my exam, soft tissue swelling and dusky appearing forefoot Musculoskeletal: no muscle wasting or atrophy  Neurologic: A&O X 3;  No focal weakness or paresthesias are detected; speech is fluent/normal Psychiatric:  The pt has Normal affect. Lymph:  Unremarkable  CBC    Component Value Date/Time   WBC 8.2 02/23/2019 0241   RBC 4.49 02/23/2019 0241   HGB 14.2 02/23/2019 0241   HGB 15.2 05/11/2018 1219   HCT 44.5 02/23/2019 0241   PLT 260 02/23/2019 0241   PLT 245 05/11/2018 1219   MCV 99.1 02/23/2019 0241   MCH 31.6 02/23/2019 0241   MCHC 31.9 02/23/2019 0241   RDW 15.4 02/23/2019 0241   LYMPHSABS 0.9 02/22/2019 1900   MONOABS 1.1 (H) 02/22/2019 1900   EOSABS 0.0 02/22/2019 1900    BASOSABS 0.0 02/22/2019 1900    BMET    Component Value Date/Time   NA 133 (L) 02/23/2019 0241   K 5.0 02/23/2019 0241   CL 99 02/23/2019 0241   CO2 18 (L) 02/23/2019 0241   GLUCOSE 103 (H) 02/23/2019 0241   BUN 56 (H) 02/23/2019 0241   CREATININE 9.77 (H) 02/23/2019 0241   CREATININE 9.49 (HH) 05/11/2018 1219   CALCIUM 9.4 02/23/2019 0241   GFRNONAA 5 (L) 02/23/2019 0241   GFRNONAA 5 (L) 05/11/2018 1219   GFRAA 6 (L) 02/23/2019 0241   GFRAA 6 (L) 05/11/2018 1219    COAGS: Lab Results  Component Value Date   INR 1.24 03/12/2013   INR 1.27 08/24/2012   INR 0.9 ratio 06/21/2010     Non-Invasive Vascular Imaging:   Plain film of left and right foot negative for osteomyelitis   ASSESSMENT/PLAN: This is a 73 y.o. male with end-stage renal disease on hemodialysis, critical limb ischemia with gangrenous toes of left foot   -Slow to heal right fifth toe amputation site however based on my exam prior revascularization measures likely still patent with peroneal and AT signals by Doppler; continue current wound care -Aortogram with runoff demonstrated all 3 tibial vessels of left lower extremity occluded as of December 2019 -Patient will likely require arteriogram with possible intervention left lower extremity in an attempt for limb salvage; discussed he will likely require at least third and fourth toe amputation and is at high risk for amputation -Continue HD via left IJ TDC; plan remains to place AV graft in right arm when patient heals wounds of bilateral lower extremities -Continue aspirin and plavix -On-call vascular surgeon Dr.  Donzetta Matters will evaluate the patient later today and provide further treatment plans   Dagoberto Ligas PA-C [facility] [phone]  I have independently interviewed and examined patient and agree with PA assessment and plan above.  Bilateral popliteal pulses are palpable.  Still has a slow healing wound on the right foot possibly has  issues with his previous retrograde access we will evaluate at the time of angiogram to evaluate the left lower extremity.  I discussed with him proceeding Friday will possibly need retrograde access on the left as well and will most certainly need amputation of left third and fourth toes as well during this hospitalization.  I discussed with him that this is a limb threatening situation and he demonstrates good understanding.  Brandon C. Donzetta Matters, MD [facility] [place] Office: [phone] Pager: [phone]

## 2019-02-23 NOTE — Plan of Care (Signed)

## 2019-02-23 NOTE — Evaluation (Signed)
Physical Therapy Evaluation Patient Details Name: Timothy Faulkner MRN: [id number] DOB: March 09, 1946 Today's Date: 02/23/2019   History of Present Illness  This is a 73 y.o. male with past medical history significant for end-stage renal disease on hemodialysis via left IJ TDC, CHF, hypertension, hyperlipidemia, diabetes mellitus.  He is being seen in consultation for evaluation of gangrenous toes with purulent discharge of left foot.  Surgical history significant for right fifth toe amputation and right TP trunk and peroneal stenting by Dr. Carlis Abbott via retrograde peroneal artery approach in December 2019.  At that time left lower extremity runoff demonstrated patent SFA, popliteal, and occlusion of all 3 tibial vessels.  He states the wounds of his left third and fourth toes have worsened over the past week.  Clinical Impression  Pt admitted with above diagnosis. Pt currently with functional limitations due to the deficits listed below (see PT Problem List). Pt was able to ambulate with RW with min guard assist with good stability.  Should progress well dependent on if pt has impending surgery or not. Will follow.  Pt will benefit from skilled PT to increase their independence and safety with mobility to allow discharge to the venue listed below.      Follow Up Recommendations Home health PT;Supervision/Assistance - 24 hour    Equipment Recommendations  None recommended by PT    Recommendations for Other Services       Precautions / Restrictions Precautions Precautions: Fall Restrictions Weight Bearing Restrictions: No      Mobility  Bed Mobility Overal bed mobility: Independent                Transfers Overall transfer level: Independent                  Ambulation/Gait Ambulation/Gait assistance: Min guard Gait Distance (Feet): 50 Feet Assistive device: Rolling walker (2 wheeled) Gait Pattern/deviations: Step-through pattern;Decreased stride length;Wide base of  support   Gait velocity interpretation: <1.31 ft/sec, indicative of household ambulator General Gait Details: Pt with safe gait with RW.  No LOB.   Stairs            Wheelchair Mobility    Modified Rankin (Stroke Patients Only)       Balance Overall balance assessment: Needs assistance Sitting-balance support: No upper extremity supported;Feet supported Sitting balance-Leahy Scale: Fair     Standing balance support: Bilateral upper extremity supported;During functional activity Standing balance-Leahy Scale: Poor Standing balance comment: relies on UE support for balance                             Pertinent Vitals/Pain Pain Assessment: No/denies pain    Home Living Family/patient expects to be discharged to:: Private residence Living Arrangements: Spouse/significant other;Children Available Help at Discharge: Friend(s);Available 24 hours/day Type of Home: House Home Access: Level entry     Home Layout: One level Home Equipment: Cane - single point;Shower seat;Walker - 2 wheels      Prior Function Level of Independence: Independent with assistive device(s)         Comments: would use SPC for steadying     Hand Dominance   Dominant Hand: Right    Extremity/Trunk Assessment   Upper Extremity Assessment Upper Extremity Assessment: Defer to OT evaluation    Lower Extremity Assessment Lower Extremity Assessment: Generalized weakness    Cervical / Trunk Assessment Cervical / Trunk Assessment: Normal  Communication   Communication: No difficulties  Cognition Arousal/Alertness:  Awake/alert Behavior During Therapy: WFL for tasks assessed/performed Overall Cognitive Status: Within Functional Limits for tasks assessed                                        General Comments      Exercises     Assessment/Plan    PT Assessment Patient needs continued PT services  PT Problem List Decreased activity tolerance;Decreased  balance;Decreased mobility;Decreased knowledge of use of DME;Decreased safety awareness;Decreased knowledge of precautions       PT Treatment Interventions DME instruction;Gait training;Functional mobility training;Therapeutic activities;Therapeutic exercise;Balance training;Patient/family education    PT Goals (Current goals can be found in the Care Plan section)  Acute Rehab PT Goals Patient Stated Goal: to go home PT Goal Formulation: With patient Time For Goal Achievement: 03/09/19 Potential to Achieve Goals: Good    Frequency Min 3X/week   Barriers to discharge        Co-evaluation               AM-PAC PT "6 Clicks" Mobility  Outcome Measure Help needed turning from your back to your side while in a flat bed without using bedrails?: None Help needed moving from lying on your back to sitting on the side of a flat bed without using bedrails?: None Help needed moving to and from a bed to a chair (including a wheelchair)?: A Little Help needed standing up from a chair using your arms (e.g., wheelchair or bedside chair)?: A Little Help needed to walk in hospital room?: A Little Help needed climbing 3-5 steps with a railing? : A Little 6 Click Score: 20    End of Session Equipment Utilized During Treatment: Gait belt Activity Tolerance: Patient limited by fatigue Patient left: with call bell/phone within reach;in bed;with bed alarm set Nurse Communication: Mobility status PT Visit Diagnosis: Muscle weakness (generalized) (M62.81)    Time: 1601-0932 PT Time Calculation (min) (ACUTE ONLY): 13 min   Charges:   PT Evaluation $PT Eval Moderate Complexity: 1 Mod          Cythia Bachtel,PT Acute Rehabilitation Services Pager:  [phone]  Office:  [place] 02/23/2019, 1:16 PM

## 2019-02-23 NOTE — Consult Note (Addendum)
[facility] Renal Consultation Note    Indication for Consultation:  Management of ESRD/hemodialysis; anemia, hypertension/volume and secondary hyperparathyroidism  GGE:[id number], Prudencio Burly, MD  HPI: Timothy Faulkner is a 73 y.o. male. ESRD on HD MWF at [facility], first starting in 03/2013.  Past medical history significant for HTN, NCIM s/p ICD, systolic HF, DM, HLD, gout and PVD s/t R 5th toe amputation and R TP trunk and peroneal stenting by Dr. Carlis Abbott in December 2019.   Patient seen and examined at bedside.  Reports worsening wounds on left 3rd and 4th toes over the last week.  Initially his daughter noted peeling skin on these toes about 1 week ago and yesterday she reported darkening of the toes with drainage and a foul odor when the home health nurse came to do a dressing change.  He is followed by Dr March Rummage and was seen in his office yesterday.  They recommend patient go to the ED for evaluation and likely admission due to the severity of the gangrene.  Denies pain, SOB, edema, weakness, fatigue, n/v/d, fever and chills.   Pertinent findings include K 5.0, lactic acid initially 3.3 now down to 0.6, and x ray of b/l feet showing no signs of osteomyelitis.  Of note patient is compliant with prescribed dialysis regimen.  He completed his full HD via tunneled dialysis catheter on 02/21/19, leaving about 0.5kg under his estimated dry weight.  There are plans for a permanent access once his foot wounds heal. Seen in HD -    Past Medical History:  Diagnosis Date  . Anemia   . Arthritis    Gout  . Automatic implantable cardioverter-defibrillator in situ    [employer]  . CHF   . ESRD on dialysis [facility])    [name] 03/11/2013 (03/11/2013) dialysis M/W/F  . GERD (gastroesophageal reflux disease)   . Gout    "once a year"  . Heart murmur   . HYPERCHOLESTEROLEMIA, MIXED   . HYPERTENSION   . Macular degeneration   . Osteomyelitis ([place])    right foot  . Other primary cardiomyopathies  07/16/2011  . Pacemaker   . Peripheral arterial disease (HCC)    left fifth toe ulcer, healing  . Pneumonia   . Shortness of breath   . 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 Wears dentures   . Wears glasses    Past Surgical History:  Procedure Laterality Date  . A/V FISTULAGRAM Left 07/20/2018   Procedure: A/V FISTULAGRAM;  Surgeon: [name], MD;  Location: [place] CV LAB;  Service: Cardiovascular;  Laterality: Left;  . ABDOMINAL AORTOGRAM W/LOWER EXTREMITY Bilateral 07/20/2018   Procedure: ABDOMINAL AORTOGRAM W/LOWER EXTREMITY;  Surgeon: [name], MD;  Location: [place] CV LAB;  Service: Cardiovascular;  Laterality: Bilateral;  . AMPUTATION Right 09/07/2018   Procedure: Right fifth metatarsectomy;  Surgeon: [name], DPM;  Location: [place];  Service: Podiatry;  Laterality: Right;  . AV FISTULA PLACEMENT Right 12/13/2012   Procedure: ARTERIOVENOUS (AV) FISTULA CREATION;  Surgeon: [name], MD;  Location: [place];  Service: Vascular;  Laterality: Right;  Ultrasound guided  . AV FISTULA PLACEMENT Left 05/07/2016   Procedure: LEFT RADIOCEPHALIC ARTERIOVENOUS (AV) FISTULA CREATION;  Surgeon: [name], MD;  Location: [place];  Service: Vascular;  Laterality: Left;  . BASCILIC VEIN TRANSPOSITION Right 03/26/2016   Procedure: RIGHT BASILIC VEIN TRANSPOSITION;  Surgeon: [name], MD;  Location: [facility];  Service: Vascular;  Laterality: Right;  . CARDIAC CATHETERIZATION    . CARDIAC DEFIBRILLATOR PLACEMENT     Chemical engineer  . EYE SURGERY Bilateral    Cataract  . FISTULOGRAM Left 04/22/2018   Procedure: FISTULOGRAM UPPER EXTREMITY;  Surgeon: [name], MD;  Location: [place];  Service: Vascular;  Laterality: Left;  . I&D EXTREMITY Right 07/15/2018   Procedure: IRRIGATION AND DEBRIDEMENT RIGHT FOOT;  Surgeon: [name], DPM;  Location: [place];  Service: Podiatry;  Laterality: Right;  . INCISION AND  DRAINAGE ABSCESS / HEMATOMA OF BURSA / KNEE / THIGH Left 2012   "knee" (03/11/2013)  . INSERT / REPLACE / REMOVE PACEMAKER    . INSERTION OF DIALYSIS CATHETER Left 04/22/2018   Procedure: INSERTION OF DIALYSIS CATHETER;  Surgeon: [name], MD;  Location: [place];  Service: Vascular;  Laterality: Left;  . IR FLUORO GUIDE CV LINE LEFT  07/15/2018  . IR PTA VENOUS EXCEPT DIALYSIS CIRCUIT  07/15/2018  . LOWER EXTREMITY ANGIOGRAPHY Right 07/21/2018   Procedure: LOWER EXTREMITY ANGIOGRAPHY;  Surgeon: [name], MD;  Location: [place] CV LAB;  Service: Cardiovascular;  Laterality: Right;  . METATARSAL HEAD EXCISION Right 07/15/2018   Procedure: METATARSAL RESECTION;  Surgeon: [name], DPM;  Location: [place];  Service: Podiatry;  Laterality: Right;  . MULTIPLE TOOTH EXTRACTIONS    . PERIPHERAL VASCULAR INTERVENTION Right 07/21/2018   Procedure: PERIPHERAL VASCULAR INTERVENTION;  Surgeon: [name], MD;  Location: [place] CV LAB;  Service: Cardiovascular;  Laterality: Right;  peroneal stents  . REVISON OF ARTERIOVENOUS FISTULA Right 5/78/4696   Procedure: Plication OF Right Arm ARTERIOVENOUS FISTULA;  Surgeon: Elam Dutch, MD;  Location: [facility] OR;  Service: Vascular;  Laterality: Right;  . REVISON OF ARTERIOVENOUS FISTULA Left 04/22/2018   Procedure: REVISION OF RADIOCEPHALIC ARTERIOVENOUS FISTULA;  Surgeon: [name], MD;  Location: [place];  Service: Vascular;  Laterality: Left;  . SHUNTOGRAM N/A 05/31/2013   Procedure: [place];  Surgeon: [name], MD;  Location: [facility] CATH LAB;  Service: Cardiovascular;  Laterality: N/A;  . UPPER EXTREMITY VENOGRAPHY Right 07/23/2018   Procedure: UPPER EXTREMITY VENOGRAPHY;  Surgeon: [name], MD;  Location: [place] CV LAB;  Service: Cardiovascular;  Laterality: Right;  . WOUND DEBRIDEMENT Right 07/17/2018   Procedure: Wound Debridement; Closure Filleted toe flap Right Foot;  Surgeon: [name], DPM;  Location: [place];  Service: Podiatry;  Laterality: Right;  . WOUND DEBRIDEMENT Right 09/07/2018   Procedure: Debridement Right Foot Wound, application of wound vac;  Surgeon: [name], DPM;  Location: [place];  Service: Podiatry;  Laterality: Right;   Family History  Problem Relation Age of Onset  . Heart disease Father   . CAD Father    Social History:  reports that he quit smoking about 26 years ago. His smoking use included cigarettes. He has a 22.50 pack-year smoking history. He has never used smokeless tobacco. He reports previous alcohol use. He reports previous drug use. Drug: Marijuana. No Known Allergies Prior to Admission medications   Medication Sig Start Date End Date Taking? Authorizing Provider  acetaminophen (TYLENOL) 500 MG tablet Take 500 mg by mouth every 6 (six) hours as needed for mild pain or headache.   Yes [provider]  aspirin EC 81 MG tablet Take 81 mg by mouth daily.   Yes [provider]  AURYXIA 1 GM 210 MG(Fe) tablet Take  420 mg by mouth 2 (two) times a day. Morning and Evening 11/12/16  Yes [provider]  calcitRIOL (ROCALTROL) 0.5 MCG capsule Take 2 capsules (1 mcg total) by mouth 3 (three) times a week. Patient taking differently: Take 0.5 mcg by mouth every Monday, Wednesday, and Friday with hemodialysis.  07/26/18  Yes Kayleen Memos, DO  clopidogrel (PLAVIX) 75 MG tablet Take 1 tablet (75 mg total) by mouth daily with breakfast. 07/23/18  Yes Irene Pap N, DO  colchicine 0.6 MG tablet Take 0.6 mg by mouth daily as needed (for gout flare ups).  02/26/18  Yes [provider]  collagenase (SANTYL) ointment Apply 1 application topically daily. Apply to right foot wound daily cover with dry dressing. 07/27/18  Yes [name], DPM  lidocaine-prilocaine (EMLA) cream Apply 1 application topically See admin instructions. Apply small amount to access site 1 to 2 hours before dialysis. 04/12/18  Yes  [provider]  midodrine (PROAMATINE) 10 MG tablet Take 10 mg by mouth every Monday, Wednesday, and Friday with hemodialysis.  04/26/18  Yes [provider]  oxyCODONE (ROXICODONE) 5 MG immediate release tablet Take 1 tablet (5 mg total) by mouth every 8 (eight) hours as needed for severe pain. 09/07/18 09/07/19 Yes [name], DPM  SENSIPAR 30 MG tablet Take 30 mg by mouth every Monday, Wednesday, and Friday with hemodialysis.    Yes [provider]  clindamycin (CLEOCIN) 150 MG capsule Take 1 capsule (150 mg total) by mouth 2 (two) times daily. Patient not taking: Reported on 02/22/2019 09/07/18   [name], DPM  collagenase (SANTYL) ointment Apply 1 application topically daily. Patient not taking: Reported on 02/22/2019 09/23/18   [name], DPM  gabapentin (NEURONTIN) 100 MG capsule TAKE 1 CAPSULE BY MOUTH AT BEDTIME. Patient taking differently: Take 100 mg by mouth at bedtime.  10/28/18   Cameron Sprang, MD  multivitamin (RENA-VIT) TABS tablet Take 1 tablet by mouth at bedtime. Patient not taking: Reported on 02/22/2019 07/23/18   Kayleen Memos, DO  pantoprazole (PROTONIX) 40 MG tablet Take 1 tablet (40 mg total) by mouth daily at 6 (six) AM. Patient not taking: Reported on 02/22/2019 07/24/18   Kayleen Memos, DO  SANTYL ointment APPLY TOPICALLY TO RIGHT FOOT WOUND DAILY, COVER WITH DRY DRESSING Patient not taking: Apply topically to right foot wound daily and cover with dry dressing 11/15/18   [name], DPM   Current Facility-Administered Medications  Medication Dose Route Frequency Provider Last Rate Last Dose  . acetaminophen (TYLENOL) tablet 650 mg  650 mg Oral Q6H PRN Rise Patience, MD       Or  . acetaminophen (TYLENOL) suppository 650 mg  650 mg Rectal Q6H PRN Rise Patience, MD      . aspirin EC tablet 81 mg  81 mg Oral Daily Rise Patience, MD   81 mg at 02/23/19 1011  . calcitRIOL (ROCALTROL) capsule 0.5 mcg  0.5  mcg Oral Q M,W,F-HD Rise Patience, MD   0.5 mcg at 02/23/19 1224  . Chlorhexidine Gluconate Cloth 2 % PADS 6 each  6 each Topical Q0600 Penninger, [place], [place]   6 each at 02/23/19 0815  . cinacalcet (SENSIPAR) tablet 30 mg  30 mg Oral Q M,W,F-HD Rise Patience, MD   30 mg at 02/23/19 1224  . [START ON 02/24/2019] clopidogrel (PLAVIX) tablet 75 mg  75 mg Oral Q breakfast Dagoberto Ligas, PA-C      .  ferric citrate (AURYXIA) tablet 420 mg  420 mg Oral BID Rise Patience, MD   420 mg at 02/23/19 1011  . gabapentin (NEURONTIN) capsule 100 mg  100 mg Oral QHS Rise Patience, MD      . midodrine (PROAMATINE) tablet 10 mg  10 mg Oral Q M,W,F-HD Rise Patience, MD   10 mg at 02/23/19 1224  . multivitamin (RENA-VIT) tablet 1 tablet  1 tablet Oral QHS Rise Patience, MD      . ondansetron [facility]) tablet 4 mg  4 mg Oral Q6H PRN Rise Patience, MD       Or  . ondansetron [facility]) injection 4 mg  4 mg Intravenous Q6H PRN Rise Patience, MD      . pantoprazole (PROTONIX) EC tablet 40 mg  40 mg Oral Q0600 Rise Patience, MD   40 mg at 02/23/19 0558  . piperacillin-tazobactam (ZOSYN) IVPB 3.375 g  3.375 g Intravenous Q12H Rise Patience, MD      . vancomycin (VANCOCIN) IVPB 750 mg/150 ml premix  750 mg Intravenous Q M,W,F-HD Einar Grad, RPH      . vancomycin variable dose per unstable renal function (pharmacist dosing)   Does not apply See admin instructions Rise Patience, MD       Labs: Basic Metabolic Panel: Recent Labs  Lab 02/22/19 1900 02/23/19 0241  NA 132* 133*  K 5.5* 5.0  CL 96* 99  CO2 18* 18*  GLUCOSE 125* 103*  BUN 50* 56*  CREATININE 9.64* 9.77*  CALCIUM 9.7 9.4   Liver Function Tests: Recent Labs  Lab 02/22/19 1900  AST 18  ALT 16  ALKPHOS 58  BILITOT 0.6  PROT 8.6*  ALBUMIN 3.4*   CBC: Recent Labs  Lab 02/22/19 1900 02/23/19 0241  WBC 8.9 8.2  NEUTROABS 6.9  --   HGB 14.2 14.2  HCT 46.6 44.5   MCV 103.1* 99.1  PLT 290 260   CBG: Recent Labs  Lab 02/23/19 0200 02/23/19 0611 02/23/19 1126  GLUCAP 118* 133* 97   Studies/Results: Dg Foot Complete Left  Result Date: 02/22/2019 CLINICAL DATA:  Right foot wound EXAM: LEFT FOOT - COMPLETE 3+ VIEW COMPARISON:  07/13/2018 FINDINGS: Vascular calcifications. Small plantar calcaneal spur. No soft tissue emphysema. No acute fracture. Mild degenerative change at the first MTP joint. Marginal erosive changes at the head of the first proximal phalanx and the head of the first metatarsal presumably related to history of gout. IMPRESSION: 1. No acute osseous abnormality. 2. Findings felt consistent with history of gout at the first digit. Electronically Signed   By: Donavan Foil M.D.   On: 02/22/2019 20:57   Dg Foot Complete Left  Result Date: 02/22/2019 Please see detailed radiograph report in office note.  Dg Foot Complete Right  Result Date: 02/22/2019 CLINICAL DATA:  Right foot wound EXAM: RIGHT FOOT COMPLETE - 3+ VIEW COMPARISON:  01/13/2019, 09/07/2018 FINDINGS: Chronic fracture deformity of the first proximal phalanx. Ununited fracture deformity proximal shaft fourth metatarsal with possible increased bone resorption at the fracture cleft. Prior amputation fifth digit at the base of fifth metatarsal without definitive acute bony destructive change. No soft tissue emphysema. IMPRESSION: 1. Prior amputation of the fifth digit at the level of the base of fifth metatarsal without definitive acute osseous changes of the remnant digit 2. Ununited fracture proximal shaft fourth metatarsal, seen on prior radiograph from June 2020. There may be increased osteolysis at the fracture cleft Electronically  Signed   By: Donavan Foil M.D.   On: 02/22/2019 21:01    ROS: All others negative except those listed in HPI.  Physical Exam: Vitals:   02/23/19 0200 02/23/19 0351 02/23/19 0736 02/23/19 1043  BP: 122/84 106/79 105/71 122/68  Pulse:   90 81   Resp: 20 (!) 22 16 15   Temp: 98.3 F (36.8 C) 98.1 F (36.7 C) 98.3 F (36.8 C) (!) 97.3 F (36.3 C)  TempSrc: Oral Oral Oral Oral  SpO2: 98% 99% 95% 99%  Weight: 79.4 kg     Height:         General: WDWN NAD, pleasant male, laying in bed Head: NCAT sclera not icteric MMM Neck: Supple. No lymphadenopathy Lungs: CTA bilaterally. No wheeze, rales or rhonchi. Breathing is unlabored. Heart: RRR. No murmur, rubs or gallops.  Abdomen: soft, nontender, +BS, no guarding, no rebound tenderness Lower extremities:no edema b/l, R 5th toe wrapped.  L 3rd and 4th toe blackened, no drainage note Neuro: AAOx3. Moves all extremities spontaneously. Psych:  Responds to questions appropriately with a normal affect. Dialysis Access: L IJ TDC  Dialysis Orders:  MWF - [place]  4hrs, BFR 400, DFR 800,  EDW 79kg, 3K/ 2.5Ca  Access: R IJ TDC  Heparin 5000, 2000 intermittent Calcitriol 0.5 mcg PO qHD   Assessment/Plan: 1.  Cellulitis w/critical limb ischemia w/gangreous toes of L foot: VVS consulted, plan for angiogram Friday and amputation of left 3rd and 4th toes during admission.  ABX started. BC ordered. 2.  ESRD -  On HD MWF. K5.0. Orders written for HD today per regular schedule.  3.  Hypertension/volume  - BP well controlled. On midodrine pre HD. Volume status stable, close to EDW.   4.  Anemia of CKD - Hgb 14.2. No indication for ESA at this time.  5.  Secondary Hyperparathyroidism -  Ca in goal.  Will check phos. Continue calcitriol and binders - auryxia 3AC TID. 6.  Nutrition - Renal diet with fluid restrictions, vit 7. DMT2 8. Hx PVD s/p stent placement in Dec 2019 for RLE - poorly healing wound  9. Chronic systolic HD s/p ICD placement  Jen Mow, PA-C [facility] Pager: [phone] 02/23/2019, 12:37 PM   Patient seen and examined, agree with above note with above modifications. ESRD patient known to [place].  Presents with ischemic toes worsening in appearance.   Appreciate VVS - plan appears to be for arteriogram and possible toe amputation.  NOt much pain, in good spirits.  Routine HD on schedule today  Corliss Parish, MD 02/23/2019

## 2019-02-23 NOTE — Progress Notes (Signed)
Pharmacy Antibiotic Note  BRANDO TAVES is a 73 y.o. male admitted on 02/22/2019 with L foot gangrene. Pharmacy asked to dose vancomycin and Zosyn. Pt has hx ESRD on HD MWF, last HD outpatient 7/13, orders for HD today on schedule. Cultures pending, vascular consulted for possible vascularization/amputation, Wt 79kg.    Plan: Vancomycin 750mg  IV qHD Zosyn 3.375g IV EI q12h  Height: 5\' 8"  (172.7 cm) Weight: 175 lb 0.7 oz (79.4 kg) IBW/kg (Calculated) : 68.4  Temp (24hrs), Avg:98.3 F (36.8 C), Min:98 F (36.7 C), Max:98.8 F (37.1 C)  Recent Labs  Lab 02/22/19 1900 02/22/19 2139 02/23/19 0241  WBC 8.9  --  8.2  CREATININE 9.64*  --  9.77*  LATICACIDVEN 3.3* 0.6  --     Estimated Creatinine Clearance: 6.6 mL/min (A) (by C-G formula based on SCr of 9.77 mg/dL (H)).    No Known Allergies  Antimicrobials this admission: Vancomycin 7/14 >>  Zosyn 7/14 >>  Dose adjustments this admission: n/a  Microbiology results: Pending  Thank you for allowing pharmacy to be a part of this patient's care.   Arrie Senate, PharmD, BCPS Clinical Pharmacist [phone] Please check AMION for all [place] numbers 02/23/2019

## 2019-02-23 NOTE — Procedures (Signed)
Patient was seen on dialysis and the procedure was supervised.  BFR 350  Via TDC BP is  103/83.   Patient appears to be tolerating treatment well  Louis Meckel 02/23/2019

## 2019-02-23 NOTE — Progress Notes (Addendum)
PROGRESS NOTE   Timothy Faulkner  JAS:[id number]    DOB: Sep 17, 1945    DOA: 02/22/2019  PCP: [name], MD   I have briefly reviewed patients previous medical records in [facility].  Chief Complaint  Patient presents with  . Wound Check    Brief Narrative:  73 year old male, lives with spouse and daughter, ambulates with help of a walker, PMH of ESRD on MWF HD, PAD s/p right peroneal stent and right fifth toe amputation for osteomyelitis, anemia, gout, chronic systolic CHF, AICD, HLD, HTN,?  DM 2, chronic hypotension, presented with several days history of left third and fourth toe darkish discoloration, wound with foul-smelling drainage, worsened in the day or 2 prior to admission, seen by OP podiatry and referred to ED for admission.  Admitted for suspected left third and fourth toe gangrene with cellulitis.  Vascular surgery consulted.  Nephrology consulted for HD needs.   Assessment & Plan:   Principal Problem:   Cellulitis of left foot Active Problems:   Automatic implantable cardioverter-defibrillator in situ   ESRD (end stage renal disease) on dialysis ([place])   Anemia associated with chronic renal failure   Cardiomyopathy, dilated, nonischemic (HCC)   Gangrene of toe of left foot (HCC)   Hypotension   PAD with suspected left third and fourth toe gangrene, superficial wound with cellulitis.  X-ray without acute findings.  MRSA PCR negative  Empirically started on IV vancomycin and Zosyn per pharmacy  Vascular surgery consulted and input pending.  May need revascularization +/- above toe amputations.  Continue aspirin and Plavix.  S/p right fifth toe amputation, chronic right lateral foot wound  Nonhealing ulcer but clean without clinical evidence of infection.  X-ray without features of osteomyelitis.  Continue wound care and outpatient follow-up.  ESRD on MWF HD  Discussed with and consulted nephrology for HD needs.  Last HD was on 7/13.     Dialyzed via left IJ tunneled HD catheter.  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  Patient follows up with [name], Nephrology.  Hyperkalemia/AG metabolic acidosis  Secondary to ESRD on HD.  Potassium 5.5 on admission, now resolved.  Chronic hypotension  Stable on midodrine, continue.  Chronic systolic CHF/ICD/PPM  Clinically appears euvolemic.  Volume management across HD.  No recent TTE in system, can consider doing outpatient.  Follows with [name], patient's PCP/Cardiology.  I updated [name] regarding patient's admission when he wanted the hospitalist to continue his care.  Diet-controlled DM 2 with renal complications  A1P 37/04/239: 4.7.   DVT prophylaxis: SCD Code Status: Full Family Communication: I called and discussed with patient's daughter via phone, updated care and answered questions. Disposition: To be determined pending clinical improvement   Consultants:  Vascular surgery Nephrology  Procedures:  None  Antimicrobials:  IV vancomycin and Zosyn   Subjective: Chief complaint: Presented with dark discoloration of left third and fourth toes, wound draining foul-smelling stuff.  Patient denies pain in his feet.  Reports that seen by home health RN on day of admission who advised provider follow-up.  No chest pain or dyspnea reported.  Objective:  Vitals:   02/22/19 2315 02/23/19 0200 02/23/19 0351 02/23/19 0736  BP: 96/73 122/84 106/79 105/71  Pulse: 93   90  Resp: 18 20 (!) 22 16  Temp:  98.3 F (36.8 C) 98.1 F (36.7 C) 98.3 F (36.8 C)  TempSrc:  Oral Oral Oral  SpO2: 99% 98% 99% 95%  Weight:  79.4 kg    Height:        Examination:  General exam: Pleasant elderly male, moderately built and nourished lying comfortably propped up in bed without distress. Respiratory system: Clear to auscultation. Respiratory effort normal.  Left IJ  tunneled HD catheter without acute findings. Cardiovascular system: S1 & S2 heard, RRR. No JVD, murmurs, rubs, gallops or clicks. No pedal edema.  Telemetry personally reviewed: Sinus rhythm with first-degree AV block. Gastrointestinal system: Abdomen is nondistended, soft and nontender. No organomegaly or masses felt. Normal bowel sounds heard. Central nervous system: Alert and oriented. No focal neurological deficits. Extremities: Symmetric 5 x 5 power. Skin: Left third and fourth toe with black discoloration, superficial wound on the medial aspect of the left fourth toe.  Dressing minimally soiled with foul-smelling drainage.  No bleeding.  Unable to palpate left dorsalis pedis or posterior tibial pulses.  Right foot s/p fifth toe amputation with healed site.  Right lateral forefoot with large deep but clean wound without acute findings of infection.  Please see pictures below from admission. Psychiatry: Judgement and insight appear normal. Mood & affect appropriate.         Data Reviewed: I have personally reviewed following labs and imaging studies  CBC: Recent Labs  Lab 02/22/19 1900 02/23/19 0241  WBC 8.9 8.2  NEUTROABS 6.9  --   HGB 14.2 14.2  HCT 46.6 44.5  MCV 103.1* 99.1  PLT 290 259   Basic Metabolic Panel: Recent Labs  Lab 02/22/19 1900 02/23/19 0241  NA 132* 133*  K 5.5* 5.0  CL 96* 99  CO2 18* 18*  GLUCOSE 125* 103*  BUN 50* 56*  CREATININE 9.64* 9.77*  CALCIUM 9.7 9.4   Liver Function Tests: Recent Labs  Lab 02/22/19 1900  AST 18  ALT 16  ALKPHOS 58  BILITOT 0.6  PROT 8.6*  ALBUMIN 3.4*   CBG: Recent Labs  Lab 02/23/19 0200 02/23/19 0611  GLUCAP 118* 133*    Recent Results (from the past 240 hour(s))  SARS Coronavirus 2 (CEPHEID - Performed in [facility] hospital lab), Hosp Order     Status: None   Collection Time: 02/22/19  9:29 PM   Specimen: Nasopharyngeal Swab  Result Value Ref Range Status   SARS Coronavirus 2 NEGATIVE NEGATIVE  Final    Comment: (NOTE) If result is NEGATIVE SARS-CoV-2 target nucleic acids are NOT DETECTED. The SARS-CoV-2 RNA is generally detectable in upper and lower  respiratory specimens during the acute phase of infection. The lowest  concentration of SARS-CoV-2 viral copies this assay can detect is 250  copies / mL. A negative result does not preclude SARS-CoV-2 infection  and should not be used as the sole basis for treatment or other  patient management decisions.  A negative result may occur with  improper specimen collection / handling, submission of specimen other  than nasopharyngeal swab, presence of viral mutation(s) within the  areas targeted by this assay, and inadequate number of viral copies  (<250 copies / mL). A negative result must be combined with clinical  observations, patient history, and epidemiological information. If result is POSITIVE SARS-CoV-2 target nucleic acids are DETECTED. The SARS-CoV-2 RNA is generally detectable in upper and lower  respiratory specimens dur ing the acute phase of infection.  Positive  results are indicative of active infection with SARS-CoV-2.  Clinical  correlation with patient history and other diagnostic information is  necessary  to determine patient infection status.  Positive results do  not rule out bacterial infection or co-infection with other viruses. If result is PRESUMPTIVE POSTIVE SARS-CoV-2 nucleic acids MAY BE PRESENT.   A presumptive positive result was obtained on the submitted specimen  and confirmed on repeat testing.  While 2019 novel coronavirus  (SARS-CoV-2) nucleic acids may be present in the submitted sample  additional confirmatory testing may be necessary for epidemiological  and / or clinical management purposes  to differentiate between  SARS-CoV-2 and other Sarbecovirus currently known to infect humans.  If clinically indicated additional testing with an alternate test  methodology [phone]) is advised. The  SARS-CoV-2 RNA is generally  detectable in upper and lower respiratory sp ecimens during the acute  phase of infection. The expected result is Negative. Fact Sheet for Patients:  StrictlyIdeas.no Fact Sheet for Healthcare Providers: BankingDealers.co.za This test is not yet approved or cleared by the [place] FDA and has been authorized for detection and/or diagnosis of SARS-CoV-2 by FDA under an Emergency Use Authorization (EUA).  This EUA will remain in effect (meaning this test can be used) for the duration of the COVID-19 declaration under Section 564(b)(1) of the Act, 21 U.S.C. section 360bbb-3(b)(1), unless the authorization is terminated or revoked sooner. Performed at [facility], [place] [address] [place], [place] [address]   MRSA PCR Screening     Status: None   Collection Time: 02/23/19  2:16 AM   Specimen: Nasal Mucosa; Nasopharyngeal  Result Value Ref Range Status   MRSA by PCR NEGATIVE NEGATIVE Final    Comment:        The GeneXpert MRSA Assay (FDA approved for NASAL specimens only), is one component of a comprehensive MRSA colonization surveillance program. It is not intended to diagnose MRSA infection nor to guide or monitor treatment for MRSA infections. Performed at [facility], [place] [address], [place], [place] [address]          Radiology Studies: Dg Foot Complete Left  Result Date: 02/22/2019 CLINICAL DATA:  Right foot wound EXAM: LEFT FOOT - COMPLETE 3+ VIEW COMPARISON:  07/13/2018 FINDINGS: Vascular calcifications. Small plantar calcaneal spur. No soft tissue emphysema. No acute fracture. Mild degenerative change at the first MTP joint. Marginal erosive changes at the head of the first proximal phalanx and the head of the first metatarsal presumably related to history of gout. IMPRESSION: 1. No acute osseous abnormality. 2. Findings felt consistent with history of gout at the first digit.  Electronically Signed   By: Donavan Foil M.D.   On: 02/22/2019 20:57   Dg Foot Complete Left  Result Date: 02/22/2019 Please see detailed radiograph report in office note.  Dg Foot Complete Right  Result Date: 02/22/2019 CLINICAL DATA:  Right foot wound EXAM: RIGHT FOOT COMPLETE - 3+ VIEW COMPARISON:  01/13/2019, 09/07/2018 FINDINGS: Chronic fracture deformity of the first proximal phalanx. Ununited fracture deformity proximal shaft fourth metatarsal with possible increased bone resorption at the fracture cleft. Prior amputation fifth digit at the base of fifth metatarsal without definitive acute bony destructive change. No soft tissue emphysema. IMPRESSION: 1. Prior amputation of the fifth digit at the level of the base of fifth metatarsal without definitive acute osseous changes of the remnant digit 2. Ununited fracture proximal shaft fourth metatarsal, seen on prior radiograph from June 2020. There may be increased osteolysis at the fracture cleft Electronically Signed   By: Donavan Foil M.D.   On: 02/22/2019 21:01  Scheduled Meds: . aspirin EC  81 mg Oral Daily  . calcitRIOL  0.5 mcg Oral Q M,W,F-HD  . Chlorhexidine Gluconate Cloth  6 each Topical Q0600  . cinacalcet  30 mg Oral Q M,W,F-HD  . [START ON 02/24/2019] clopidogrel  75 mg Oral Q breakfast  . ferric citrate  420 mg Oral BID  . gabapentin  100 mg Oral QHS  . midodrine  10 mg Oral Q M,W,F-HD  . multivitamin  1 tablet Oral QHS  . pantoprazole  40 mg Oral Q0600  . vancomycin variable dose per unstable renal function (pharmacist dosing)   Does not apply See admin instructions   Continuous Infusions: . piperacillin-tazobactam (ZOSYN)  IV       LOS: 1 day     Timothy Leep, MD, FACP, [place]. [facility]  To contact the attending provider between 7A-7P or the covering provider during after hours 7P-7A, please log into the web site [email or web address] and access using universal [place] password for that web site.  If you do not have the password, please call the hospital operator.  02/23/2019, 9:17 AM

## 2019-02-23 NOTE — H&P (View-Only) (Signed)
Hospital Consult    Reason for Consult: Gangrenous toes left foot Requesting Physician:  [facility] MRN #:  [id number]  History of Present Illness: This is a 73 y.o. male with past medical history significant for end-stage renal disease on hemodialysis via left IJ TDC, CHF, hypertension, hyperlipidemia, diabetes mellitus.  He is being seen in consultation for evaluation of gangrenous toes with purulent discharge of left foot.  Surgical history significant for right fifth toe amputation and right TP trunk and peroneal stenting by Dr. Carlis Abbott via retrograde peroneal artery approach in December 2019.  At that time left lower extremity runoff demonstrated patent SFA, popliteal, and occlusion of all 3 tibial vessels.  He states the wounds of his left third and fourth toes have worsened over the past week.  He is taking aspirin and Plavix daily.  He is ambulatory with a walker.  He has been started on IV antibiotics.  No findings of osteomyelitis on plain films.  Past Medical History:  Diagnosis Date  . Anemia   . Arthritis    Gout  . Automatic implantable cardioverter-defibrillator in situ    [employer]  . CHF   . ESRD on dialysis [facility])    [name] 03/11/2013 (03/11/2013) dialysis M/W/F  . GERD (gastroesophageal reflux disease)   . Gout    "once a year"  . Heart murmur   . HYPERCHOLESTEROLEMIA, MIXED   . HYPERTENSION   . Macular degeneration   . Osteomyelitis ([place])    right foot  . 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 Pacemaker   . Peripheral arterial disease (HCC)    left fifth toe ulcer, healing  . Pneumonia   . Shortness of breath   . 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 Wears dentures   . Wears glasses     Past Surgical History:  Procedure Laterality Date  . A/V FISTULAGRAM Left 07/20/2018   Procedure: A/V FISTULAGRAM;  Surgeon: [name], MD;  Location: [place] CV LAB;  Service: Cardiovascular;  Laterality: Left;  .  ABDOMINAL AORTOGRAM W/LOWER EXTREMITY Bilateral 07/20/2018   Procedure: ABDOMINAL AORTOGRAM W/LOWER EXTREMITY;  Surgeon: [name], MD;  Location: [place] CV LAB;  Service: Cardiovascular;  Laterality: Bilateral;  . AMPUTATION Right 09/07/2018   Procedure: Right fifth metatarsectomy;  Surgeon: [name], DPM;  Location: [place];  Service: Podiatry;  Laterality: Right;  . AV FISTULA PLACEMENT Right 12/13/2012   Procedure: ARTERIOVENOUS (AV) FISTULA CREATION;  Surgeon: [name], MD;  Location: [place];  Service: Vascular;  Laterality: Right;  Ultrasound guided  . AV FISTULA PLACEMENT Left 05/07/2016   Procedure: LEFT RADIOCEPHALIC ARTERIOVENOUS (AV) FISTULA CREATION;  Surgeon: [name], MD;  Location: [place];  Service: Vascular;  Laterality: Left;  . BASCILIC VEIN TRANSPOSITION Right 03/26/2016   Procedure: RIGHT BASILIC VEIN TRANSPOSITION;  Surgeon: [name], MD;  Location: [place];  Service: Vascular;  Laterality: Right;  . CARDIAC CATHETERIZATION    . CARDIAC DEFIBRILLATOR PLACEMENT     Chemical engineer  . EYE SURGERY Bilateral    Cataract  . FISTULOGRAM Left 04/22/2018   Procedure: FISTULOGRAM UPPER EXTREMITY;  Surgeon: [name], MD;  Location: [place];  Service: Vascular;  Laterality: Left;  . I&D EXTREMITY Right 07/15/2018   Procedure: IRRIGATION AND DEBRIDEMENT RIGHT FOOT;  Surgeon: [name], DPM;  Location: [place];  Service: Podiatry;  Laterality: Right;  . INCISION AND  DRAINAGE ABSCESS / HEMATOMA OF BURSA / KNEE / THIGH Left 2012   "knee" (03/11/2013)  . INSERT / REPLACE / REMOVE PACEMAKER    . INSERTION OF DIALYSIS CATHETER Left 04/22/2018   Procedure: INSERTION OF DIALYSIS CATHETER;  Surgeon: [name], MD;  Location: [place];  Service: Vascular;  Laterality: Left;  . IR FLUORO GUIDE CV LINE LEFT  07/15/2018  . IR PTA VENOUS EXCEPT DIALYSIS CIRCUIT  07/15/2018  . LOWER EXTREMITY ANGIOGRAPHY Right 07/21/2018   Procedure: LOWER EXTREMITY  ANGIOGRAPHY;  Surgeon: [name], MD;  Location: [place] CV LAB;  Service: Cardiovascular;  Laterality: Right;  . METATARSAL HEAD EXCISION Right 07/15/2018   Procedure: METATARSAL RESECTION;  Surgeon: [name], DPM;  Location: [place];  Service: Podiatry;  Laterality: Right;  . MULTIPLE TOOTH EXTRACTIONS    . PERIPHERAL VASCULAR INTERVENTION Right 07/21/2018   Procedure: PERIPHERAL VASCULAR INTERVENTION;  Surgeon: [name], MD;  Location: [place] CV LAB;  Service: Cardiovascular;  Laterality: Right;  peroneal stents  . REVISON OF ARTERIOVENOUS FISTULA Right 9/32/6712   Procedure: Plication OF Right Arm ARTERIOVENOUS FISTULA;  Surgeon: Elam Dutch, MD;  Location: [facility];  Service: Vascular;  Laterality: Right;  . REVISON OF ARTERIOVENOUS FISTULA Left 04/22/2018   Procedure: REVISION OF RADIOCEPHALIC ARTERIOVENOUS FISTULA;  Surgeon: [name], MD;  Location: [place];  Service: Vascular;  Laterality: Left;  . SHUNTOGRAM N/A 05/31/2013   Procedure: Earney Mallet;  Surgeon: [name], MD;  Location: [facility] CATH LAB;  Service: Cardiovascular;  Laterality: N/A;  . UPPER EXTREMITY VENOGRAPHY Right 07/23/2018   Procedure: UPPER EXTREMITY VENOGRAPHY;  Surgeon: [name], MD;  Location: [place] CV LAB;  Service: Cardiovascular;  Laterality: Right;  . WOUND DEBRIDEMENT Right 07/17/2018   Procedure: Wound Debridement; Closure Filleted toe flap Right Foot;  Surgeon: [name], DPM;  Location: [place];  Service: Podiatry;  Laterality: Right;  . WOUND DEBRIDEMENT Right 09/07/2018   Procedure: Debridement Right Foot Wound, application of wound vac;  Surgeon: [name], DPM;  Location: [place];  Service: Podiatry;  Laterality: Right;    No Known Allergies  Prior to Admission medications   Medication Sig Start Date End Date Taking? Authorizing Provider  acetaminophen (TYLENOL) 500 MG tablet Take 500 mg by mouth every 6 (six) hours as needed  for mild pain or headache.   Yes [provider]  aspirin EC 81 MG tablet Take 81 mg by mouth daily.   Yes [provider]  AURYXIA 1 GM 210 MG(Fe) tablet Take 420 mg by mouth 2 (two) times a day. Morning and Evening 11/12/16  Yes [provider]  calcitRIOL (ROCALTROL) 0.5 MCG capsule Take 2 capsules (1 mcg total) by mouth 3 (three) times a week. Patient taking differently: Take 0.5 mcg by mouth every Monday, Wednesday, and Friday with hemodialysis.  07/26/18  Yes Kayleen Memos, DO  clopidogrel (PLAVIX) 75 MG tablet Take 1 tablet (75 mg total) by mouth daily with breakfast. 07/23/18  Yes Irene Pap N, DO  colchicine 0.6 MG tablet Take 0.6 mg by mouth daily as needed (for gout flare ups).  02/26/18  Yes [provider]  collagenase (SANTYL) ointment Apply 1 application topically daily. Apply to right foot wound daily cover with dry dressing. 07/27/18  Yes [name], DPM  lidocaine-prilocaine (EMLA) cream Apply 1 application topically See admin instructions. Apply small amount to access site 1 to 2 hours before dialysis.  04/12/18  Yes [provider]  midodrine (PROAMATINE) 10 MG tablet Take 10 mg by mouth every Monday, Wednesday, and Friday with hemodialysis.  04/26/18  Yes [provider]  oxyCODONE (ROXICODONE) 5 MG immediate release tablet Take 1 tablet (5 mg total) by mouth every 8 (eight) hours as needed for severe pain. 09/07/18 09/07/19 Yes [name], DPM  SENSIPAR 30 MG tablet Take 30 mg by mouth every Monday, Wednesday, and Friday with hemodialysis.    Yes [provider]  clindamycin (CLEOCIN) 150 MG capsule Take 1 capsule (150 mg total) by mouth 2 (two) times daily. Patient not taking: Reported on 02/22/2019 09/07/18   [name], DPM  collagenase (SANTYL) ointment Apply 1 application topically daily. Patient not taking: Reported on 02/22/2019 09/23/18   [name], DPM  gabapentin (NEURONTIN) 100 MG capsule  TAKE 1 CAPSULE BY MOUTH AT BEDTIME. Patient taking differently: Take 100 mg by mouth at bedtime.  10/28/18   Cameron Sprang, MD  multivitamin (RENA-VIT) TABS tablet Take 1 tablet by mouth at bedtime. Patient not taking: Reported on 02/22/2019 07/23/18   Kayleen Memos, DO  pantoprazole (PROTONIX) 40 MG tablet Take 1 tablet (40 mg total) by mouth daily at 6 (six) AM. Patient not taking: Reported on 02/22/2019 07/24/18   Kayleen Memos, DO  SANTYL ointment APPLY TOPICALLY TO RIGHT FOOT WOUND DAILY, COVER WITH DRY DRESSING Patient not taking: Apply topically to right foot wound daily and cover with dry dressing 11/15/18   [name], DPM    Social History   Socioeconomic History  . Marital status: Married    Spouse name: [name]  . Number of children: 2  . Years of education: 19  . Highest education level: Not on file  Occupational History  . Occupation: diasbled  Social Needs  . Financial resource strain: Not on file  . Food insecurity    Worry: Not on file    Inability: Not on file  . Transportation needs    Medical: Not on file    Non-medical: Not on file  Tobacco Use  . Smoking status: Former Smoker    Packs/day: 0.75    Years: 30.00    Pack years: 22.50    Types: Cigarettes    Quit date: 11/25/1992    Years since quitting: 26.2  . Smokeless tobacco: Never Used  Substance and Sexual Activity  . Alcohol use: Not Currently  . Drug use: Not Currently    Types: Marijuana  . Sexual activity: Yes  Lifestyle  . Physical activity    Days per week: Not on file    Minutes per session: Not on file  . Stress: Not on file  Relationships  . Social Herbalist on phone: Not on file    Gets together: Not on file    Attends religious service: Not on file    Active member of club or organization: Not on file    Attends meetings of clubs or organizations: Not on file    Relationship status: Not on file  . Intimate partner violence    Fear of current or ex partner: Not  on file    Emotionally abused: Not on file    Physically abused: Not on file    Forced sexual activity: Not on file  Other Topics Concern  . 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 Heart disease Father   . CAD Father     ROS: Otherwise negative unless mentioned in HPI  Physical Examination  Vitals:   02/23/19 0351 02/23/19 0736  BP: 106/79 105/71  Pulse:  90  Resp: (!) 22 16  Temp: 98.1 F (36.7 C) 98.3 F (36.8 C)  SpO2: 99% 95%   Body mass index is 26.62 kg/m.  General:  WDWN in NAD Gait: Not observed HENT: WNL, normocephalic Pulmonary: normal non-labored breathing Cardiac: regular Abdomen:  soft, NT/ND, no masses Skin: without rashes Vascular Exam/Pulses: Symmetrical femoral pulses, unable to palpate popliteal pulses, right peroneal signal and soft AT signal; left AT signal diminished by Doppler Extremities: Slow to heal right fifth toe amputation site however appears clean with no frank sign of infection; left third and fourth toes gangrenous with odor no drainage noted on my exam, soft tissue swelling and dusky appearing forefoot Musculoskeletal: no muscle wasting or atrophy  Neurologic: A&O X 3;  No focal weakness or paresthesias are detected; speech is fluent/normal Psychiatric:  The pt has Normal affect. Lymph:  Unremarkable  CBC    Component Value Date/Time   WBC 8.2 02/23/2019 0241   RBC 4.49 02/23/2019 0241   HGB 14.2 02/23/2019 0241   HGB 15.2 05/11/2018 1219   HCT 44.5 02/23/2019 0241   PLT 260 02/23/2019 0241   PLT 245 05/11/2018 1219   MCV 99.1 02/23/2019 0241   MCH 31.6 02/23/2019 0241   MCHC 31.9 02/23/2019 0241   RDW 15.4 02/23/2019 0241   LYMPHSABS 0.9 02/22/2019 1900   MONOABS 1.1 (H) 02/22/2019 1900   EOSABS 0.0 02/22/2019 1900    BASOSABS 0.0 02/22/2019 1900    BMET    Component Value Date/Time   NA 133 (L) 02/23/2019 0241   K 5.0 02/23/2019 0241   CL 99 02/23/2019 0241   CO2 18 (L) 02/23/2019 0241   GLUCOSE 103 (H) 02/23/2019 0241   BUN 56 (H) 02/23/2019 0241   CREATININE 9.77 (H) 02/23/2019 0241   CREATININE 9.49 (HH) 05/11/2018 1219   CALCIUM 9.4 02/23/2019 0241   GFRNONAA 5 (L) 02/23/2019 0241   GFRNONAA 5 (L) 05/11/2018 1219   GFRAA 6 (L) 02/23/2019 0241   GFRAA 6 (L) 05/11/2018 1219    COAGS: Lab Results  Component Value Date   INR 1.24 03/12/2013   INR 1.27 08/24/2012   INR 0.9 ratio 06/21/2010     Non-Invasive Vascular Imaging:   Plain film of left and right foot negative for osteomyelitis   ASSESSMENT/PLAN: This is a 73 y.o. male with end-stage renal disease on hemodialysis, critical limb ischemia with gangrenous toes of left foot   -Slow to heal right fifth toe amputation site however based on my exam prior revascularization measures likely still patent with peroneal and AT signals by Doppler; continue current wound care -Aortogram with runoff demonstrated all 3 tibial vessels of left lower extremity occluded as of December 2019 -Patient will likely require arteriogram with possible intervention left lower extremity in an attempt for limb salvage; discussed he will likely require at least third and fourth toe amputation and is at high risk for amputation -Continue HD via left IJ TDC; plan remains to place AV graft in right arm when patient heals wounds of bilateral lower extremities -Continue aspirin and plavix -On-call vascular surgeon Dr.  Donzetta Matters will evaluate the patient later today and provide further treatment plans   Dagoberto Ligas PA-C [facility] [phone]  I have independently interviewed and examined patient and agree with PA assessment and plan above.  Bilateral popliteal pulses are palpable.  Still has a slow healing wound on the right foot possibly has  issues with his previous retrograde access we will evaluate at the time of angiogram to evaluate the left lower extremity.  I discussed with him proceeding Friday will possibly need retrograde access on the left as well and will most certainly need amputation of left third and fourth toes as well during this hospitalization.  I discussed with him that this is a limb threatening situation and he demonstrates good understanding.  Paraskevi Funez C. Donzetta Matters, MD [facility] [place] Office: [phone] Pager: [phone]

## 2019-02-24 ENCOUNTER — Ambulatory Visit: Payer: Medicare Other | Admitting: Podiatry

## 2019-02-24 ENCOUNTER — Inpatient Hospital Stay (HOSPITAL_COMMUNITY): Payer: Medicare Other

## 2019-02-24 DIAGNOSIS — I959 Hypotension, unspecified: Secondary | ICD-10-CM

## 2019-02-24 DIAGNOSIS — Z95810 Presence of automatic (implantable) cardiac defibrillator: Secondary | ICD-10-CM

## 2019-02-24 DIAGNOSIS — I96 Gangrene, not elsewhere classified: Secondary | ICD-10-CM

## 2019-02-24 DIAGNOSIS — N189 Chronic kidney disease, unspecified: Secondary | ICD-10-CM

## 2019-02-24 DIAGNOSIS — D631 Anemia in chronic kidney disease: Secondary | ICD-10-CM

## 2019-02-24 DIAGNOSIS — L0390 Cellulitis, unspecified: Secondary | ICD-10-CM

## 2019-02-24 LAB — GLUCOSE, CAPILLARY
Glucose-Capillary: 77 mg/dL (ref 70–99)
Glucose-Capillary: 137 mg/dL — ABNORMAL HIGH (ref 70–99)
Glucose-Capillary: 109 mg/dL — ABNORMAL HIGH (ref 70–99)
Glucose-Capillary: 113 mg/dL — ABNORMAL HIGH (ref 70–99)

## 2019-02-24 MED ORDER — SODIUM CHLORIDE 0.9 % IV BOLUS
250.0000 mL | Freq: Once | INTRAVENOUS | Status: AC
  Administered 2019-02-24: 01:00:00 250 mL via INTRAVENOUS

## 2019-02-24 MED ORDER — MIDODRINE HCL 5 MG PO TABS
10.0000 mg | ORAL_TABLET | Freq: Once | ORAL | Status: AC
  Administered 2019-02-24: 10 mg via ORAL
  Filled 2019-02-24: qty 2

## 2019-02-24 MED ORDER — CHLORHEXIDINE GLUCONATE CLOTH 2 % EX PADS
6.0000 | MEDICATED_PAD | Freq: Every day | CUTANEOUS | Status: DC
  Administered 2019-02-24 – 2019-02-25 (×2): 6 via TOPICAL

## 2019-02-24 NOTE — Progress Notes (Signed)
This nurse spoke with Janett Billow RN and notified that this patient have very poor vasculature and will she will need to contact MD for further placement options. This information had also been communicated to day shift nurse when last PIV was placed. Patient had been assessed bilateral UE with [place]. Janett Billow RN VU. Fran Lowes, RN VAST

## 2019-02-24 NOTE — TOC Initial Note (Signed)
Transition of Care [facility]) - Initial/Assessment Note    Patient Details  Name: Timothy Faulkner MRN: [id number] Date of Birth: 08-Feb-1946  Transition of [facility]) CM/SW Contact:    Zenon Mayo, RN Phone Number: 02/24/2019, 3:56 PM  Clinical Narrative:                 From home with wife, patient is for toes amputation tomorrow, he is active with [facility] for [facility], will need to add HHPT.  Patient states would like to continue to stay with [facility].  Dorian Pod with [facility] notified.  Will need resumption orders at discharge.   Expected Discharge Plan: [place] Barriers to Discharge: No Barriers Identified   Patient Goals and CMS Choice Patient states their goals for this hospitalization and ongoing recovery are:: to walk again CMS Medicare.gov Compare Post Acute Care list provided to:: Patient Choice offered to / list presented to : Patient  Expected Discharge Plan and Services Expected Discharge Plan: [place] In-house Referral: NA Discharge Planning Services: CM Consult Post Acute Care Choice: Home Health, Resumption of Svcs/PTA Provider Living arrangements for the past 2 months: Single Family Home                 DME Arranged: (NA)         HH Arranged: PT, RN [place] Agency: [facility] Date [place]: 02/24/19 Time HH Agency Contacted: 54 Representative spoke with at [place]: Dorian Pod  Prior Living Arrangements/Services Living arrangements for the past 2 months: [place] Lives with:: Spouse Patient language and need for interpreter reviewed:: Yes Do you feel safe going back to the place where you live?: Yes      Need for Family Participation in Patient Care: Yes (Comment) Care giver support system in place?: Yes (comment) Current home services: Home RN Criminal Activity/Legal Involvement Pertinent to Current Situation/Hospitalization: No - Comment as needed  Activities of Daily Living Home Assistive  Devices/Equipment: Environmental consultant (specify type) ADL Screening (condition at time of admission) Patient's cognitive ability adequate to safely complete daily activities?: Yes Is the patient deaf or have difficulty hearing?: No Does the patient have difficulty seeing, even when wearing glasses/contacts?: Yes Does the patient have difficulty concentrating, remembering, or making decisions?: No Patient able to express need for assistance with ADLs?: Yes Does the patient have difficulty dressing or bathing?: Yes Independently performs ADLs?: No Communication: Independent Dressing (OT): Needs assistance Is this a change from baseline?: Pre-admission baseline Grooming: Needs assistance Is this a change from baseline?: Pre-admission baseline Feeding: Independent Bathing: Needs assistance Is this a change from baseline?: Pre-admission baseline Toileting: Needs assistance Is this a change from baseline?: Pre-admission baseline In/Out Bed: Needs assistance Is this a change from baseline?: Pre-admission baseline Walks in Home: Needs assistance Is this a change from baseline?: Pre-admission baseline Does the patient have difficulty walking or climbing stairs?: Yes Weakness of Legs: Both Weakness of Arms/Hands: None  Permission Sought/Granted                  Emotional Assessment Appearance:: Appears stated age Attitude/Demeanor/Rapport: Engaged Affect (typically observed): Appropriate Orientation: : Oriented to Self, Oriented to Place, Oriented to  Time, Oriented to Situation   Psych Involvement: No (comment)  Admission diagnosis:  Hypotension, unspecified hypotension type [I95.9] Gangrene of toe of left foot ([place]) [I96] Patient Active Problem List   Diagnosis Date Noted  . Hypotension 02/23/2019  . Cellulitis of left foot 02/23/2019  .  Gangrene of toe of left foot ([place]) 02/22/2019  . 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 Diabetic foot ulcer ([place]) 09/02/2018   . Encounter for planned post-operative wound closure   . Acute osteomyelitis of metatarsal bone of right foot ([place])   . Diabetic ulcer of midfoot associated with diabetes mellitus due to underlying condition, with necrosis of bone ([place])   . Osteomyelitis of right foot ([place])   . Vascular calcification   . Cellulitis and abscess of foot, except toes   . Anemia associated with chronic renal failure 07/13/2018  . Cardiomyopathy, dilated, nonischemic ([place]) 07/13/2018  . Diabetic foot infection ([place]) 07/13/2018  . PAD (peripheral artery disease) ([place])   . Macular degeneration   . Chronic systolic heart failure ([place]) 08/22/2014  . Diabetic infection of right foot ([place]) 07/13/2014  . ESRD (end stage renal disease) on dialysis ([place]) 11/11/2013  . Snoring 11/11/2013  . Unspecified sleep apnea 11/11/2013  . Other pancytopenia ([place]) 09/22/2013  . Hemoptysis 09/20/2013  . Pre-transplant evaluation for kidney transplant 01/11/2013  . Automatic implantable cardioverter-defibrillator in situ 10/01/2010  . HYPERCHOLESTEROLEMIA, MIXED 05/03/2010  . Essential hypertension 05/03/2010  . CHF 05/03/2010   PCP:  [name], MD Pharmacy:   [id number] - [place], [place] - [address] [place] [address] Phone: [phone] Fax: [place], [place] - [address] [place] [address] Phone: [phone] Fax: [phone]  [facility] - [place], [place] - [address], [address] [place] [address] Phone: [phone] Fax: [phone]     Social Determinants of Health (SDOH) Interventions    Readmission Risk Interventions No flowsheet data found.

## 2019-02-24 NOTE — Progress Notes (Signed)
Subjective:  HD late yest- removed 1800- tolerated well at the time but then has had low BPs recorded overnight. No symptoms though.   Plan is for arteriogram tomorrow   Objective Vital signs in last 24 hours: Vitals:   02/24/19 0235 02/24/19 0319 02/24/19 0547 02/24/19 0553  BP: (!) 103/34 (!) 82/42  (!) 94/48  Pulse: 83 88  90  Resp: (!) 21 13  (!) 0  Temp:  98.2 F (36.8 C)    TempSrc:  Oral    SpO2: 95% 92%  100%  Weight:   80.1 kg   Height:       Weight change: 0.16 kg  Intake/Output Summary (Last 24 hours) at 02/24/2019 5465 Last data filed at 02/24/2019 0500 Gross per 24 hour  Intake 1085.41 ml  Output 1879 ml  Net -793.59 ml    Dialysis Orders:  MWF - [facility]  4hrs, BFR 400, DFR 800,  EDW 79kg, 3K/ 2.5Ca  Access: R IJ TDC  Heparin 5000, 2000 intermittent Calcitriol 0.5 mcg PO qHD   Assessment/Plan: 1.  Cellulitis w/critical limb ischemia w/gangreous toes of L foot: VVS consulted, plan for angiogram Friday and amputation of left 3rd and 4th toes possibly during admission.  vanc and zosyn started.  2.  ESRD -  On HD MWF. K5.0. Orders written for HD tomorrow per regular schedule. VVS requests he be done first shift tomorrow  3.  Hypertension/volume  - BP well controlled. On midodrine pre HD. Volume status stable, close to EDW.   4.  Anemia of CKD - Hgb 14.2. No indication for ESA at this time.  5.  Secondary Hyperparathyroidism -  Ca in goal.  Will check phos. Continue calcitriol and binders - auryxia 3AC TID. Also on sensipar 6.  Nutrition - Renal diet with fluid restrictions, vit 7. DMT2 8. Hx PVD s/p stent placement in Dec 2019 for RLE - poorly healing wound  9. Chronic systolic HD s/p ICD placement    Timothy Faulkner A Timothy Faulkner    Labs: Basic Metabolic Panel: Recent Labs  Lab 02/22/19 1900 02/23/19 0241  NA 132* 133*  K 5.5* 5.0  CL 96* 99  CO2 18* 18*  GLUCOSE 125* 103*  BUN 50* 56*  CREATININE 9.64* 9.77*  CALCIUM 9.7 9.4   Liver Function  Tests: Recent Labs  Lab 02/22/19 1900  AST 18  ALT 16  ALKPHOS 58  BILITOT 0.6  PROT 8.6*  ALBUMIN 3.4*   No results for input(s): LIPASE, AMYLASE in the last 168 hours. No results for input(s): AMMONIA in the last 168 hours. CBC: Recent Labs  Lab 02/22/19 1900 02/23/19 0241  WBC 8.9 8.2  NEUTROABS 6.9  --   HGB 14.2 14.2  HCT 46.6 44.5  MCV 103.1* 99.1  PLT 290 260   Cardiac Enzymes: No results for input(s): CKTOTAL, CKMB, CKMBINDEX, TROPONINI in the last 168 hours. CBG: Recent Labs  Lab 02/23/19 0200 02/23/19 0611 02/23/19 1126 02/23/19 2131 02/24/19 0641  GLUCAP 118* 133* 97 144* 77    Iron Studies: No results for input(s): IRON, TIBC, TRANSFERRIN, FERRITIN in the last 72 hours. Studies/Results: Dg Foot Complete Left  Result Date: 02/22/2019 CLINICAL DATA:  Right foot wound EXAM: LEFT FOOT - COMPLETE 3+ VIEW COMPARISON:  07/13/2018 FINDINGS: Vascular calcifications. Small plantar calcaneal spur. No soft tissue emphysema. No acute fracture. Mild degenerative change at the first MTP joint. Marginal erosive changes at the head of the first proximal phalanx and the head of the first metatarsal  presumably related to history of gout. IMPRESSION: 1. No acute osseous abnormality. 2. Findings felt consistent with history of gout at the first digit. Electronically Signed   By: Donavan Foil M.D.   On: 02/22/2019 20:57   Dg Foot Complete Left  Result Date: 02/22/2019 Please see detailed radiograph report in office note.  Dg Foot Complete Right  Result Date: 02/22/2019 CLINICAL DATA:  Right foot wound EXAM: RIGHT FOOT COMPLETE - 3+ VIEW COMPARISON:  01/13/2019, 09/07/2018 FINDINGS: Chronic fracture deformity of the first proximal phalanx. Ununited fracture deformity proximal shaft fourth metatarsal with possible increased bone resorption at the fracture cleft. Prior amputation fifth digit at the base of fifth metatarsal without definitive acute bony destructive change. No  soft tissue emphysema. IMPRESSION: 1. Prior amputation of the fifth digit at the level of the base of fifth metatarsal without definitive acute osseous changes of the remnant digit 2. Ununited fracture proximal shaft fourth metatarsal, seen on prior radiograph from June 2020. There may be increased osteolysis at the fracture cleft Electronically Signed   By: Donavan Foil M.D.   On: 02/22/2019 21:01   Medications: Infusions: . piperacillin-tazobactam (ZOSYN)  IV 3.375 g (02/24/19 0129)  . vancomycin Stopped (02/23/19 1857)    Scheduled Medications: . aspirin EC  81 mg Oral Daily  . calcitRIOL  0.5 mcg Oral Q M,W,F-HD  . Chlorhexidine Gluconate Cloth  6 each Topical Q0600  . cinacalcet  30 mg Oral Q M,W,F-HD  . clopidogrel  75 mg Oral Q breakfast  . ferric citrate  630 mg Oral TID WC  . gabapentin  100 mg Oral QHS  . midodrine  10 mg Oral Q M,W,F-HD  . multivitamin  1 tablet Oral QHS  . pantoprazole  40 mg Oral Q0600  . vancomycin variable dose per unstable renal function (pharmacist dosing)   Does not apply See admin instructions    have reviewed scheduled and prn medications.  Physical Exam: General: NAD Heart: RRR Lungs: mostly clear Abdomen: soft, non tender Extremities: no edema-  Both feet bandaged  Dialysis Access: TDC     02/24/2019,6:53 AM  LOS: 2 days

## 2019-02-24 NOTE — Progress Notes (Signed)
ABI has been completed.   Preliminary results in CV Proc.   Abram Sander 02/24/2019 11:48 AM

## 2019-02-24 NOTE — Progress Notes (Signed)
Patient lost IV access.  IV team states that there is nothing else to stick and recommends central access.  Jeannette Corpus notified. No new orders.  Will continue to monitor.

## 2019-02-24 NOTE — Progress Notes (Signed)
PROGRESS NOTE   Timothy Faulkner  OZD:[id number]    DOB: Aug 04, 1946    DOA: 02/22/2019  PCP: [name], MD   I have briefly reviewed patients previous medical records in [facility].  Chief Complaint  Patient presents with  . Wound Check    Brief Narrative:  73 year old male, lives with spouse and daughter, ambulates with help of a walker, PMH of ESRD on MWF HD, PAD s/p right peroneal stent and right fifth toe amputation for osteomyelitis, anemia, gout, chronic systolic CHF, AICD, HLD, HTN,?  DM 2, chronic hypotension, presented with several days history of left third and fourth toe darkish discoloration, wound with foul-smelling drainage, worsened in the day or 2 prior to admission, seen by OP podiatry and referred to ED for admission.  Admitted for suspected left third and fourth toe gangrene with cellulitis.  Vascular surgery consulted and plan arteriogram 7/17.  Nephrology consulted for HD needs.   Assessment & Plan:   Principal Problem:   Cellulitis of left foot Active Problems:   Automatic implantable cardioverter-defibrillator in situ   ESRD (end stage renal disease) on dialysis ([place])   Anemia associated with chronic renal failure   Cardiomyopathy, dilated, nonischemic (HCC)   PAD (peripheral artery disease) (HCC)   Gangrene of toe of left foot (HCC)   Hypotension   PAD with suspected left third and fourth toe gangrene, superficial wound with cellulitis.  X-ray without acute findings.  Empirically started on IV vancomycin and Zosyn per pharmacy.  Blood cultures x2: Negative to date.  Vascular surgery input appreciated.  Plan for arteriogram, possible revascularization of left lower extremity and amputation of left third and fourth toes on 7/17 at 1 PM.  Patient supposed to be dialyzed prior to procedure tomorrow.  Continue aspirin and Plavix.  S/p right fifth toe amputation, chronic right lateral foot wound  Nonhealing ulcer but clean without clinical  evidence of infection.  X-ray without features of osteomyelitis.  Continue wound care and outpatient follow-up.  Vascular surgery and podiatry input appreciated.  ESRD on MWF HD  Nephrology consulting for HD needs.  Last HD was on 7/15.  Next HD planned for first shift 7/17 prior to vascular procedure.  Dialyzed via left IJ tunneled HD catheter.  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  Patient follows up with [name], Nephrology.  Hyperkalemia/AG metabolic acidosis  Secondary to ESRD on HD.  Potassium 5.5 on admission, now resolved.  Chronic hypotension  Stable on midodrine, continue.    Patient gets midodrine only on dialysis days.  Ongoing low blood pressures with automatic BP cuff.  Manual check however this morning showed 107/18.  Patient asymptomatic and peripheries (except lower extremities) seem well perfused and warm.  Chronic systolic CHF/ICD/PPM  Clinically appears euvolemic.  Volume management across HD.  No recent TTE in system, can consider doing outpatient.  Follows with [name], patient's PCP/Cardiology.  I updated [name] on 7/15 regarding patient's admission when he wanted the hospitalist to continue his care.  Diet-controlled DM 2 with renal complications  Q5Z 56/10/8754: 4.7.  CBGs well controlled on diet alone, discontinue CBG checks.   DVT prophylaxis: SCD Code Status: Full Family Communication: I called and discussed with patient's daughter via phone on 7/15, updated care and answered questions. Disposition: To be determined pending clinical improvement   Consultants:  Vascular surgery Nephrology  Procedures:  HD on  7/15  Antimicrobials:  IV vancomycin and Zosyn   Subjective: Patient interviewed and examined along with RN at bedside.  RN reports low blood pressures overnight with SBP ranging in the 70s-90s.  Patient however  asymptomatic of dizziness, lightheadedness, chest pain, dyspnea.  No pain reported.  Aware of vascular procedure in a.m.  Appreciative of me updating his family yesterday.  Objective:  Vitals:   02/24/19 0319 02/24/19 0547 02/24/19 0553 02/24/19 0759  BP: (!) 82/42  (!) 94/48 (!) 85/31  Pulse: 88  90 99  Resp: 13  (!) 0 (!) 23  Temp: 98.2 F (36.8 C)   98.8 F (37.1 C)  TempSrc: Oral   Oral  SpO2: 92%  100% 92%  Weight:  80.1 kg    Height:        Examination:  General exam: Pleasant elderly male, moderately built and nourished lying comfortably propped up in bed without distress. Respiratory system: Clear to auscultation.  No wheezing, rhonchi or crackles.  No increased work of breathing.  Left IJ tunneled HD catheter without acute findings.  Stable without change. Cardiovascular system: S1 & S2 heard, RRR. No JVD, murmurs, rubs, gallops or clicks. No pedal edema.  Telemetry personally reviewed: Sinus rhythm with first-degree AV block. Gastrointestinal system: Abdomen is nondistended, soft and nontender. No organomegaly or masses felt. Normal bowel sounds heard. Central nervous system: Alert and oriented. No focal neurological deficits. Extremities: Symmetric 5 x 5 power. Skin: As examined on 7/15: Left third and fourth toe with black discoloration, superficial wound on the medial aspect of the left fourth toe. No bleeding.  Unable to palpate left dorsalis pedis or posterior tibial pulses.  Right foot s/p fifth toe amputation with healed site.  Right lateral forefoot with large deep but clean wound without acute findings of infection.  Please see pictures below from admission.  Dressing on both feet clean and dry.  Palpable bilateral popliteal pulses. Psychiatry: Judgement and insight appear normal. Mood & affect appropriate.         Data Reviewed: I have personally reviewed following labs and imaging studies  CBC: Recent Labs  Lab 02/22/19 1900 02/23/19 0241  WBC 8.9 8.2   NEUTROABS 6.9  --   HGB 14.2 14.2  HCT 46.6 44.5  MCV 103.1* 99.1  PLT 290 449   Basic Metabolic Panel: Recent Labs  Lab 02/22/19 1900 02/23/19 0241  NA 132* 133*  K 5.5* 5.0  CL 96* 99  CO2 18* 18*  GLUCOSE 125* 103*  BUN 50* 56*  CREATININE 9.64* 9.77*  CALCIUM 9.7 9.4   Liver Function Tests: Recent Labs  Lab 02/22/19 1900  AST 18  ALT 16  ALKPHOS 58  BILITOT 0.6  PROT 8.6*  ALBUMIN 3.4*   CBG: Recent Labs  Lab 02/23/19 0200 02/23/19 0611 02/23/19 1126 02/23/19 2131 02/24/19 0641  GLUCAP 118* 133* 97 144* 77    Recent Results (from the past 240 hour(s))  Blood culture (routine x 2)     Status: None (Preliminary result)   Collection Time: 02/22/19  7:45 PM   Specimen: BLOOD  Result Value Ref Range Status   Specimen Description BLOOD BLOOD LEFT WRIST  Final   Special Requests   Final    BOTTLES DRAWN AEROBIC AND ANAEROBIC Blood Culture adequate volume   Culture   Final    NO GROWTH < 24 HOURS Performed at [facility], [address], [place], [place] [address]    Report Status PENDING  Incomplete  Blood culture (routine x 2)     Status: None (Preliminary result)   Collection Time: 02/22/19  8:20 PM   Specimen: BLOOD RIGHT HAND  Result Value Ref Range Status   Specimen Description BLOOD RIGHT HAND  Final   Special Requests   Final    BOTTLES DRAWN AEROBIC AND ANAEROBIC Blood Culture results may not be optimal due to an inadequate volume of blood received in culture bottles   Culture   Final    NO GROWTH < 24 HOURS Performed at [facility], [place] [address], [place], [place] [address]    Report Status PENDING  Incomplete  SARS Coronavirus 2 (CEPHEID - Performed in [place] hospital lab), Hosp Order     Status: None   Collection Time: 02/22/19  9:29 PM   Specimen: Nasopharyngeal Swab  Result Value Ref Range Status   SARS Coronavirus 2 NEGATIVE NEGATIVE Final    Comment: (NOTE) If result is NEGATIVE SARS-CoV-2 target nucleic acids  are NOT DETECTED. The SARS-CoV-2 RNA is generally detectable in upper and lower  respiratory specimens during the acute phase of infection. The lowest  concentration of SARS-CoV-2 viral copies this assay can detect is 250  copies / mL. A negative result does not preclude SARS-CoV-2 infection  and should not be used as the sole basis for treatment or other  patient management decisions.  A negative result may occur with  improper specimen collection / handling, submission of specimen other  than nasopharyngeal swab, presence of viral mutation(s) within the  areas targeted by this assay, and inadequate number of viral copies  (<250 copies / mL). A negative result must be combined with clinical  observations, patient history, and epidemiological information. If result is POSITIVE SARS-CoV-2 target nucleic acids are DETECTED. The SARS-CoV-2 RNA is generally detectable in upper and lower  respiratory specimens dur ing the acute phase of infection.  Positive  results are indicative of active infection with SARS-CoV-2.  Clinical  correlation with patient history and other diagnostic information is  necessary to determine patient infection status.  Positive results do  not rule out bacterial infection or co-infection with other viruses. If result is PRESUMPTIVE POSTIVE SARS-CoV-2 nucleic acids MAY BE PRESENT.   A presumptive positive result was obtained on the submitted specimen  and confirmed on repeat testing.  While 2019 novel coronavirus  (SARS-CoV-2) nucleic acids may be present in the submitted sample  additional confirmatory testing may be necessary for epidemiological  and / or clinical management purposes  to differentiate between  SARS-CoV-2 and other Sarbecovirus currently known to infect humans.  If clinically indicated additional testing with an alternate test  methodology [phone]) is advised. The SARS-CoV-2 RNA is generally  detectable in upper and lower respiratory sp ecimens  during the acute  phase of infection. The expected result is Negative. Fact Sheet for Patients:  StrictlyIdeas.no Fact Sheet for Healthcare Providers: BankingDealers.co.za This test is not yet approved or cleared by the [place] FDA and has been authorized for detection and/or diagnosis of SARS-CoV-2 by FDA under an Emergency Use Authorization (EUA).  This EUA will remain in effect (meaning this test can be used) for the duration of the COVID-19 declaration under Section 564(b)(1) of the Act, 21 U.S.C. section 360bbb-3(b)(1), unless the authorization is terminated or revoked sooner. Performed at [facility], [place] [address] [place], [place] [address]   MRSA PCR Screening     Status: None   Collection Time: 02/23/19  2:16 AM   Specimen: Nasal Mucosa; Nasopharyngeal  Result Value Ref Range Status   MRSA by PCR NEGATIVE NEGATIVE Final    Comment:        The GeneXpert MRSA Assay (FDA approved for NASAL specimens only), is one component of a comprehensive MRSA colonization surveillance program. It is not intended to diagnose MRSA infection nor to guide or monitor treatment for MRSA infections. Performed at [facility], [place] [address], [place], [place] [address]          Radiology Studies: Dg Foot Complete Left  Result Date: 02/22/2019 CLINICAL DATA:  Right foot wound EXAM: LEFT FOOT - COMPLETE 3+ VIEW COMPARISON:  07/13/2018 FINDINGS: Vascular calcifications. Small plantar calcaneal spur. No soft tissue emphysema. No acute fracture. Mild degenerative change at the first MTP joint. Marginal erosive changes at the head of the first proximal phalanx and the head of the first metatarsal presumably related to history of gout. IMPRESSION: 1. No acute osseous abnormality. 2. Findings felt consistent with history of gout at the first digit. Electronically Signed   By: Donavan Foil M.D.   On: 02/22/2019 20:57   Dg Foot  Complete Left  Result Date: 02/22/2019 Please see detailed radiograph report in office note.  Dg Foot Complete Right  Result Date: 02/22/2019 CLINICAL DATA:  Right foot wound EXAM: RIGHT FOOT COMPLETE - 3+ VIEW COMPARISON:  01/13/2019, 09/07/2018 FINDINGS: Chronic fracture deformity of the first proximal phalanx. Ununited fracture deformity proximal shaft fourth metatarsal with possible increased bone resorption at the fracture cleft. Prior amputation fifth digit at the base of fifth metatarsal without definitive acute bony destructive change. No soft tissue emphysema. IMPRESSION: 1. Prior amputation of the fifth digit at the level of the base of fifth metatarsal without definitive acute osseous changes of the remnant digit 2. Ununited fracture proximal shaft fourth metatarsal, seen on prior radiograph from June 2020. There may be increased osteolysis at the fracture cleft Electronically Signed   By: Donavan Foil M.D.   On: 02/22/2019 21:01        Scheduled Meds: . aspirin EC  81 mg Oral Daily  . calcitRIOL  0.5 mcg Oral Q M,W,F-HD  . Chlorhexidine Gluconate Cloth  6 each Topical Q0600  . Chlorhexidine Gluconate Cloth  6 each Topical Q0600  . cinacalcet  30 mg Oral Q M,W,F-HD  . clopidogrel  75 mg Oral Q breakfast  . ferric citrate  630 mg Oral TID WC  . gabapentin  100 mg Oral QHS  . midodrine  10 mg Oral Q M,W,F-HD  . multivitamin  1 tablet Oral QHS  . pantoprazole  40 mg Oral Q0600  . vancomycin variable dose per unstable renal function (pharmacist dosing)   Does not apply See admin instructions   Continuous Infusions: . piperacillin-tazobactam (ZOSYN)  IV 3.375 g (02/24/19 0129)  . vancomycin Stopped (02/23/19 1857)     LOS: 2 days     Vernell Leep, MD, FACP, [facility]. [facility]  To contact the attending provider between 7A-7P or the covering provider during after hours 7P-7A, please log into the web site [email or web address] and access using universal Export password  for that web site. If you do not have the password, please call the hospital operator.  02/24/2019, 9:26 AM

## 2019-02-24 NOTE — Progress Notes (Signed)
MD notified of low pressure and MAP. Requested manual pressure. Manual pressure 70/48. Paged MD with readings.

## 2019-02-24 NOTE — Progress Notes (Signed)
   Plan is for arteriogram possible revascularization left lower extremity tomorrow.  As he is scheduled for 1300 I discussed with [name] proceeding with dialysis early.  N.p.o. past midnight.  Brandon C. Donzetta Matters, MD [facility] Office: [phone] Pager: [phone]

## 2019-02-25 ENCOUNTER — Encounter (HOSPITAL_COMMUNITY)
Admission: EM | Disposition: A | Discharge status: Home Health | Payer: Self-pay | Source: Home / Self Care | Attending: Internal Medicine

## 2019-02-25 DIAGNOSIS — I96 Gangrene, not elsewhere classified: Secondary | ICD-10-CM

## 2019-02-25 HISTORY — PX: ABDOMINAL AORTOGRAM: CATH118222

## 2019-02-25 HISTORY — PX: LOWER EXTREMITY ANGIOGRAPHY: CATH118251

## 2019-02-25 HISTORY — PX: PERIPHERAL VASCULAR BALLOON ANGIOPLASTY: CATH118281

## 2019-02-25 LAB — GLUCOSE, CAPILLARY
Glucose-Capillary: 71 mg/dL (ref 70–99)
Glucose-Capillary: 65 mg/dL — ABNORMAL LOW (ref 70–99)
Glucose-Capillary: 67 mg/dL — ABNORMAL LOW (ref 70–99)
Glucose-Capillary: 76 mg/dL (ref 70–99)
Glucose-Capillary: 62 mg/dL — ABNORMAL LOW (ref 70–99)

## 2019-02-25 LAB — CBC
HCT: 40.9 % (ref 39.0–52.0)
Hemoglobin: 12.8 g/dL — ABNORMAL LOW (ref 13.0–17.0)
MCH: 31.5 pg (ref 26.0–34.0)
MCHC: 31.3 g/dL (ref 30.0–36.0)
MCV: 100.7 fL — ABNORMAL HIGH (ref 80.0–100.0)
Platelets: 310 10*3/uL (ref 150–400)
RBC: 4.06 MIL/uL — ABNORMAL LOW (ref 4.22–5.81)
RDW: 15.7 % — ABNORMAL HIGH (ref 11.5–15.5)
WBC: 9.1 10*3/uL (ref 4.0–10.5)
nRBC: 0 % (ref 0.0–0.2)

## 2019-02-25 LAB — RENAL FUNCTION PANEL
Albumin: 2.8 g/dL — ABNORMAL LOW (ref 3.5–5.0)
Anion gap: 16 — ABNORMAL HIGH (ref 5–15)
BUN: 45 mg/dL — ABNORMAL HIGH (ref 8–23)
CO2: 19 mmol/L — ABNORMAL LOW (ref 22–32)
Calcium: 9 mg/dL (ref 8.9–10.3)
Chloride: 99 mmol/L (ref 98–111)
Creatinine, Ser: 9.04 mg/dL — ABNORMAL HIGH (ref 0.61–1.24)
GFR calc Af Amer: 6 mL/min — ABNORMAL LOW (ref >60)
GFR calc non Af Amer: 5 mL/min — ABNORMAL LOW (ref >60)
Glucose, Bld: 81 mg/dL (ref 70–99)
Phosphorus: 2.7 mg/dL (ref 2.5–4.6)
Potassium: 3.8 mmol/L (ref 3.5–5.1)
Sodium: 134 mmol/L — ABNORMAL LOW (ref 135–145)

## 2019-02-25 LAB — POCT ACTIVATED CLOTTING TIME
Activated Clotting Time: 301 seconds
Activated Clotting Time: 290 seconds
Activated Clotting Time: 197 seconds
Activated Clotting Time: 175 seconds

## 2019-02-25 SURGERY — ABDOMINAL AORTOGRAM
Anesthesia: LOCAL

## 2019-02-25 MED ORDER — ONDANSETRON HCL 4 MG/2ML IJ SOLN: 4.0000 mg | Freq: Four times a day (QID) | INTRAMUSCULAR | Status: DC | PRN

## 2019-02-25 MED ORDER — IODIXANOL 320 MG/ML IV SOLN
INTRAVENOUS | Status: DC | PRN
  Administered 2019-02-25: 135 mL via INTRAVENOUS

## 2019-02-25 MED ORDER — SODIUM CHLORIDE 0.9 % IV SOLN: 250.0000 mL | INTRAVENOUS | Status: DC | PRN

## 2019-02-25 MED ORDER — OXYCODONE HCL 5 MG PO TABS
5.0000 mg | ORAL_TABLET | ORAL | Status: DC | PRN
  Administered 2019-02-27: 10 mg via ORAL
  Filled 2019-02-25: qty 2

## 2019-02-25 MED ORDER — LABETALOL HCL 5 MG/ML IV SOLN: 10.0000 mg | INTRAVENOUS | Status: DC | PRN

## 2019-02-25 MED ORDER — COLLAGENASE 250 UNIT/GM EX OINT
1.0000 "application " | TOPICAL_OINTMENT | Freq: Every day | CUTANEOUS | Status: DC
  Administered 2019-02-25 – 2019-03-03 (×6): 1 via TOPICAL
  Filled 2019-02-25: qty 30

## 2019-02-25 MED ORDER — MORPHINE SULFATE (PF) 10 MG/ML IV SOLN: 2.0000 mg | INTRAVENOUS | Status: DC | PRN

## 2019-02-25 MED ORDER — SODIUM CHLORIDE 0.9% FLUSH: 3.0000 mL | INTRAVENOUS | Status: DC | PRN

## 2019-02-25 MED ORDER — HEPARIN SODIUM (PORCINE) 1000 UNIT/ML IJ SOLN
INTRAMUSCULAR | Status: AC
  Filled 2019-02-25: qty 1

## 2019-02-25 MED ORDER — HEPARIN SODIUM (PORCINE) 1000 UNIT/ML DIALYSIS: 20.0000 [IU]/kg | INTRAMUSCULAR | Status: DC | PRN

## 2019-02-25 MED ORDER — SODIUM CHLORIDE 0.9% FLUSH
3.0000 mL | Freq: Two times a day (BID) | INTRAVENOUS | Status: DC
  Administered 2019-02-27 – 2019-03-02 (×5): 3 mL via INTRAVENOUS

## 2019-02-25 MED ORDER — HEPARIN (PORCINE) IN NACL 1000-0.9 UT/500ML-% IV SOLN
INTRAVENOUS | Status: AC
  Filled 2019-02-25: qty 1000

## 2019-02-25 MED ORDER — VANCOMYCIN HCL IN DEXTROSE 750-5 MG/150ML-% IV SOLN
INTRAVENOUS | Status: AC
  Administered 2019-02-25: 750 mg via INTRAVENOUS
  Filled 2019-02-25: qty 150

## 2019-02-25 MED ORDER — HEPARIN (PORCINE) IN NACL 1000-0.9 UT/500ML-% IV SOLN
INTRAVENOUS | Status: DC | PRN
  Administered 2019-02-25 (×2): 500 mL

## 2019-02-25 MED ORDER — HYDRALAZINE HCL 20 MG/ML IJ SOLN: 5.0000 mg | INTRAMUSCULAR | Status: DC | PRN

## 2019-02-25 MED ORDER — FENTANYL CITRATE (PF) 100 MCG/2ML IJ SOLN
INTRAMUSCULAR | Status: AC
  Filled 2019-02-25: qty 2

## 2019-02-25 MED ORDER — GLUCOSE 40 % PO GEL
1.0000 | ORAL | Status: AC
  Administered 2019-02-25: 12:00:00 37.5 g via ORAL

## 2019-02-25 MED ORDER — FERRIC CITRATE 1 GM 210 MG(FE) PO TABS
420.0000 mg | ORAL_TABLET | Freq: Three times a day (TID) | ORAL | Status: DC
  Administered 2019-02-25 – 2019-03-03 (×15): 420 mg via ORAL
  Filled 2019-02-25 (×20): qty 2

## 2019-02-25 MED ORDER — LIDOCAINE HCL (PF) 1 % IJ SOLN
INTRAMUSCULAR | Status: AC
  Filled 2019-02-25: qty 30

## 2019-02-25 MED ORDER — ACETAMINOPHEN 325 MG PO TABS
650.0000 mg | ORAL_TABLET | ORAL | Status: DC | PRN
  Filled 2019-02-25: qty 2

## 2019-02-25 MED ORDER — NITROGLYCERIN 1 MG/10 ML FOR IR/CATH LAB
INTRA_ARTERIAL | Status: AC
  Filled 2019-02-25: qty 10

## 2019-02-25 MED ORDER — HEPARIN SODIUM (PORCINE) 1000 UNIT/ML IJ SOLN
INTRAMUSCULAR | Status: DC | PRN
  Administered 2019-02-25: 8000 [IU] via INTRAVENOUS

## 2019-02-25 MED ORDER — FENTANYL CITRATE (PF) 100 MCG/2ML IJ SOLN
INTRAMUSCULAR | Status: DC | PRN
  Administered 2019-02-25: 25 ug via INTRAVENOUS

## 2019-02-25 MED ORDER — HEPARIN SODIUM (PORCINE) 1000 UNIT/ML IJ SOLN
INTRAMUSCULAR | Status: AC
  Filled 2019-02-25: qty 4

## 2019-02-25 MED ORDER — GLUCOSE 40 % PO GEL
ORAL | Status: AC
  Filled 2019-02-25: qty 1

## 2019-02-25 MED ORDER — HEPARIN SODIUM (PORCINE) 1000 UNIT/ML IJ SOLN
INTRAMUSCULAR | Status: AC
  Filled 2019-02-25: qty 2

## 2019-02-25 SURGICAL SUPPLY — 20 items
BALLN STERLING OTW 3X100X150 (BALLOONS) ×4
BALLN STERLING OTW 3X40X150 (BALLOONS) ×4
BALLOON STERLING OTW 3X100X150 (BALLOONS) IMPLANT
BALLOON STERLING OTW 3X40X150 (BALLOONS) IMPLANT
CATH ANGIO 5F PIGTAIL 65CM (CATHETERS) ×1 IMPLANT
CATH CROSS OVER TEMPO 5F (CATHETERS) ×1 IMPLANT
CATH CXI SUPP 2.6F 150 ANG (CATHETERS) ×1 IMPLANT
GUIDEWIRE ANGLED .035X260CM (WIRE) ×1 IMPLANT
KIT ENCORE 26 ADVANTAGE (KITS) ×1 IMPLANT
KIT PV (KITS) ×4 IMPLANT
SHEATH PINNACLE 5F 10CM (SHEATH) ×1 IMPLANT
SHEATH PINNACLE 6F 10CM (SHEATH) ×1 IMPLANT
SHEATH PINNACLE ST 6F 65CM (SHEATH) ×1 IMPLANT
SHEATH PROBE COVER 6X72 (BAG) ×1 IMPLANT
SYR MEDRAD MARK V 150ML (SYRINGE) ×1 IMPLANT
TRANSDUCER W/STOPCOCK (MISCELLANEOUS) ×4 IMPLANT
TRAY PV CATH (CUSTOM PROCEDURE TRAY) ×4 IMPLANT
WIRE BENTSON .035X145CM (WIRE) ×1 IMPLANT
WIRE G V18X300CM (WIRE) ×2 IMPLANT
WIRE ROSEN-J .035X260CM (WIRE) ×1 IMPLANT

## 2019-02-25 NOTE — Progress Notes (Signed)
PT Cancellation Note  Patient Details Name: Timothy Faulkner MRN: [id number] DOB: Jul 07, 1946   Cancelled Treatment:    Reason Eval/Treat Not Completed: Patient at procedure or test/unavailable Pt is in HD this morning. PT will follow back this afternoon as able.  Brynleigh Sequeira B. Migdalia Dk PT, DPT Acute Rehabilitation Services Pager [phone] Office [phone]    [place] 02/25/2019, 8:30 AM

## 2019-02-25 NOTE — Progress Notes (Signed)
Subjective:  Seen on HD   Plan is for arteriogram with possible revasc later today- he tells me he lost his IV   Objective Vital signs in last 24 hours: Vitals:   02/25/19 0730 02/25/19 0800 02/25/19 0830 02/25/19 0900  BP: (!) 103/52 113/74 (!) 149/108 (!) 141/51  Pulse: 98 96 97 (!) 102  Resp: 18 19 17 18   Temp:      TempSrc:      SpO2:      Weight:      Height:       Weight change: -0.5 kg  Intake/Output Summary (Last 24 hours) at 02/25/2019 0937 Last data filed at 02/24/2019 2101 Gross per 24 hour  Intake 705.76 ml  Output -  Net 705.76 ml    Dialysis Orders:  MWF - [facility]  4hrs, BFR 400, DFR 800,  EDW 79kg, 3K/ 2.5Ca  Access: R IJ TDC  Heparin 5000, 2000 intermittent Calcitriol 0.5 mcg PO qHD   Assessment/Plan: 1.  Cellulitis w/critical limb ischemia w/gangreous toes of L foot: VVS consulted, plan for angiogram with possible revasc later today and amputation of left 3rd and 4th toes possibly during admission.  vanc and zosyn started.  2.  ESRD -  On HD MWF. K5.0.  HD today per regular schedule.  3.  Hypertension/volume  - BP well controlled. On midodrine pre HD. Volume status stable, close to EDW.   4.  Anemia of CKD - Hgb 14.2. No indication for ESA at this time.  5.  Secondary Hyperparathyroidism -  Ca in goal.  Will check phos. Continue calcitriol and binders - auryxia 3AC TID- will dec to 2 TID. Also on sensipar 6.  Nutrition - Renal diet with fluid restrictions, vit 7. DMT2 8. Hx PVD s/p stent placement in Dec 2019 for RLE - poorly healing wound  9. Chronic systolic HD s/p ICD placement    Dunbar Buras A [facility] [place]    Labs: Basic Metabolic Panel: Recent Labs  Lab 02/22/19 1900 02/23/19 0241 02/25/19 0740  NA 132* 133* 134*  K 5.5* 5.0 3.8  CL 96* 99 99  CO2 18* 18* 19*  GLUCOSE 125* 103* 81  BUN 50* 56* 45*  CREATININE 9.64* 9.77* 9.04*  CALCIUM 9.7 9.4 9.0  PHOS  --   --  2.7   Liver Function Tests: Recent Labs  Lab 02/22/19 1900  02/25/19 0740  AST 18  --   ALT 16  --   ALKPHOS 58  --   BILITOT 0.6  --   PROT 8.6*  --   ALBUMIN 3.4* 2.8*   No results for input(s): LIPASE, AMYLASE in the last 168 hours. No results for input(s): AMMONIA in the last 168 hours. CBC: Recent Labs  Lab 02/22/19 1900 02/23/19 0241 02/25/19 0740  WBC 8.9 8.2 9.1  NEUTROABS 6.9  --   --   HGB 14.2 14.2 12.8*  HCT 46.6 44.5 40.9  MCV 103.1* 99.1 100.7*  PLT 290 260 310   Cardiac Enzymes: No results for input(s): CKTOTAL, CKMB, CKMBINDEX, TROPONINI in the last 168 hours. CBG: Recent Labs  Lab 02/24/19 0641 02/24/19 1122 02/24/19 1620 02/24/19 2152 02/25/19 0614  GLUCAP 77 137* 109* 113* 71    Iron Studies: No results for input(s): IRON, TIBC, TRANSFERRIN, FERRITIN in the last 72 hours. Studies/Results: Vas [place] Burnard Bunting With/wo Tbi  Result Date: 02/24/2019 LOWER EXTREMITY DOPPLER STUDY Indications: Ulceration, and gangrene. High Risk Factors: Hypertension, hyperlipidemia, Diabetes.  Comparison Study: previous 07/14/18 Performing Technologist: Jinny Blossom  [facility]  Examination Guidelines: A complete evaluation includes at minimum, Doppler waveform signals and systolic blood pressure reading at the level of bilateral brachial, anterior tibial, and posterior tibial arteries, when vessel segments are accessible. Bilateral testing is considered an integral part of a complete examination. Photoelectric Plethysmograph (PPG) waveforms and toe systolic pressure readings are included as required and additional duplex testing as needed. Limited examinations for reoccurring indications may be performed as noted.  ABI Findings: +--------+------------------+-----+-------------------+-----------+ Right   Rt Pressure (mmHg)IndexWaveform           Comment     +--------+------------------+-----+-------------------+-----------+ [id number]                    triphasic                       +--------+------------------+-----+-------------------+-----------+ PTA     73                0.51 dampened monophasic            +--------+------------------+-----+-------------------+-----------+ DP                                                not audible +--------+------------------+-----+-------------------+-----------+ +--------+------------------+-----+-------------------+-------------------+ Left    Lt Pressure (mmHg)IndexWaveform           Comment             +--------+------------------+-----+-------------------+-------------------+ Brachial                       triphasic          unable to obtain bp +--------+------------------+-----+-------------------+-------------------+ PTA     48                0.34 dampened monophasic                    +--------+------------------+-----+-------------------+-------------------+ DP                                                not audible         +--------+------------------+-----+-------------------+-------------------+ +-------+-----------+-----------+------------+------------+ ABI/TBIToday's ABIToday's TBIPrevious ABIPrevious TBI +-------+-----------+-----------+------------+------------+ Right  0.51                                           +-------+-----------+-----------+------------+------------+ Left   0.34                                           +-------+-----------+-----------+------------+------------+  Summary: Right: Resting right ankle-brachial index indicates moderate right lower extremity arterial disease. Left: Resting left ankle-brachial index indicates severe left lower extremity arterial disease.  *See table(s) above for measurements and observations.  Electronically signed by Harold Barban MD on 02/24/2019 at 1:12:58 PM.   Final    Medications: Infusions: . piperacillin-tazobactam (ZOSYN)  IV Stopped (02/24/19 2039)  . vancomycin Stopped (02/23/19 1857)    Scheduled Medications: .  aspirin EC  81 mg Oral Daily  . calcitRIOL  0.5 mcg Oral Q M,W,F-HD  . Chlorhexidine  Gluconate Cloth  6 each Topical [id number]  . Chlorhexidine Gluconate Cloth  6 each Topical Q0600  . cinacalcet  30 mg Oral Q M,W,F-HD  . clopidogrel  75 mg Oral Q breakfast  . ferric citrate  630 mg Oral TID WC  . gabapentin  100 mg Oral QHS  . heparin      . midodrine  10 mg Oral Q M,W,F-HD  . multivitamin  1 tablet Oral QHS  . pantoprazole  40 mg Oral Q0600  . vancomycin variable dose per unstable renal function (pharmacist dosing)   Does not apply See admin instructions    have reviewed scheduled and prn medications.  Physical Exam: General: NAD Heart: RRR Lungs: mostly clear Abdomen: soft, non tender Extremities: no edema-  Both feet bandaged  Dialysis Access: TDC     02/25/2019,9:37 AM  LOS: 3 days

## 2019-02-25 NOTE — Progress Notes (Signed)
Hypoglycemic Event  CBG: 67  Treatment: 1 tube glucose gel  Symptoms: None  Follow-up CBG: Time:1217 CBG Result:76  Possible Reasons for Event: Inadequate meal intake, NPO with HD for planned OR this afternoon.  Comments/MD notified:[place]. Pt is NPO and no PIV access.    Pricilla Holm P

## 2019-02-25 NOTE — Op Note (Addendum)
Procedure: Abdominal aortogram with bilateral lower extremity runoff left tibioperoneal trunk and posterior tibial artery angioplasty  Preoperative diagnosis: Nonhealing wound left foot  Postoperative diagnosis: Same  Anesthesia: Local  Operative findings: #1 recurrent stenosis origin of anterior tibial and peroneal artery right leg 3 diseased one-vessel peroneal runoff patent stent  2.  Subtotal occlusion left tibioperoneal trunk and proximal posterior tibial artery angioplastied to less than 10% stenosis  3.  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  Occlusion right anterior tibial artery  Operative details: After team informed consent, patient taken the [facility] lab.  The patient placed supine position Angie table.  Both groins were prepped and draped in usual sterile fashion.  Ultrasound was used to identify the right common femoral artery and femoral bifurcation.  Image was obtained for the patient's record.  These were patent.  Introducer needle was then used to cannulate the right common femoral artery and an 035 versa core wire threaded up the abdominal aorta under fluoroscopic guidance.  Next 5 French sheath placed over the guidewire in the right common femoral artery.  5 French pigtail catheter was advanced over the guidewire in the abdominal aorta and an abdominal aortogram was obtained AP projection.  Infrarenal abdominal aorta left and right common internal and external iliac arteries are all patent.  Next pigtail catheter is pulled down distally of the aorta bifurcation and bilateral lower extremity runoff views were obtained.  In the right lower extremity, the right common femoral profundofemoral superficial femoral-popliteal arteries are all widely patent.  The origin of the tibioperoneal trunk has a subtotal occlusion there is a peroneal stent just below this about 3 mm and this is patent although the vessel is very diseased.  This is the only runoff  vessel to the foot which gives off a collateral but the posterior tibial artery.  In the left lower extremity, the left common femoral profunda femoris and superficial femoral arteries are all patent.  The left popliteal artery is patent.  The anterior tibial artery is occluded.  The tibioperoneal trunk has a subtotal occlusion with reconstitution of the posterior tibial artery via collaterals.  Peroneal artery is occluded.  At this point is decided to intervene on the narrowing in the tibial vessels origins on the left side.  An 035 angled Glidewire was advanced up and over the aortic bifurcation using crossover catheter.  This was advanced down into the left superficial femoral artery.  This wire was then exchanged over catheter for an 035 Rosen wire.  A 6 French flex sheath was then advanced up and over the aortic bifurcation and down into the distal left superficial femoral artery.  I then gave the patient 8000 units intravenous heparin.  ACT was confirmed to be 300.  A V18 wire was advanced over a CSI catheter. I was then able to advance the guidewire across the subtotal occlusion of the tibioperoneal trunk and deep into the posterior tibial artery.  A 3 x 100 angioplasty balloon was then brought up in the operative field and centered on the lesion and inflated to nominal pressure for 1 minute.  There was near resolution of all the stenosis.  There was still one segment of calcified stenosis at the proximal aspect of the tibioperoneal trunk as well as distally on the posterior tibial artery.  Therefore I took a 3 x 40 angioplasty balloon and angioplastied both of these segments to nominal pressure for 1 minute each.  Completion angiogram showed  patency of the posterior tibial artery resolution of the tibioperoneal trunk stenosis except for the small area of calcification there was good flow through the vessel.  I do not believe a further dilation would make this any better and at this point the guidewire  was pulled back the sheath was pulled back in the right hemipelvis and switched over the Rosen wire to a 6 [place] short sheath.  The patient tired procedure well and there were no complications.  The patient was taken the holding area in stable condition.  Operative management: Successful angioplasty of the left tibioperoneal trunk and posterior tibial artery.  Due to the fact the patient has had recurrent stenosis in the right leg.  He will be scheduled Monday by Dr. Donzetta Matters to repeat his arteriogram with a left femoral approach to try to reopen the narrowing at the proximal aspect of his right leg stent.  Ruta Hinds, MD [facility] Office: [phone] Pager: [phone]

## 2019-02-25 NOTE — Progress Notes (Signed)
  Subjective:  Patient ID: Timothy Faulkner, male    DOB: 1946-03-16,  MRN: [id number]  Patient seen in HD this AM. Denies complaints. Plan for arteriogram today. Objective:   Vitals:   02/25/19 1705 02/25/19 1720  BP: 114/64 127/73  Pulse: 100 100  Resp: 15 (!) 21  Temp:    SpO2: 92% 94%   General AA&O x3. Normal mood and affect.  Vascular Dorsalis pedis and posterior tibial pulses non-palpable. Absent pedal hair. Pallor left 3rd/4th toes.  Neurologic Epicritic sensation grossly diminished.  Dermatologic Dressings c/d/i bilateral feet. 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  Orthopedic: Calves supple, no tenderness. Positive motor to level of the digits.   Assessment & Plan:  Patient was evaluated and treated and all questions answered.  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 -Discussed plan for later amputation with patient. Discussed procedure could be performed by either vascular team or myself. Patient asked me to perform the amputation to which I said if that was his choice I would be happy to do so. Will plan depending upon vascular procedure results. -Continue betadine WTD daily.  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 -WBAT in surgical shoe.  [name], DPM  Accessible via secure chat for questions or concerns.

## 2019-02-25 NOTE — Progress Notes (Signed)
Hypoglycemic Event  CBG: 62  Treatment: 8 oz juice/soda, pt given tray.  Symptoms: None, assymptomatic  Possible Reasons for Event: Inadequate meal intake   Pricilla Holm P

## 2019-02-25 NOTE — Progress Notes (Signed)
Physical Therapy Treatment Patient Details Name: Timothy Faulkner MRN: [id number] DOB: 1946-01-27 Today's Date: 02/25/2019    History of Present Illness This is a 73 y.o. male with past medical history significant for end-stage renal disease on hemodialysis via left IJ TDC, CHF, hypertension, hyperlipidemia, diabetes mellitus.  He is being seen in consultation for evaluation of gangrenous toes with purulent discharge of left foot.  Surgical history significant for right fifth toe amputation and right TP trunk and peroneal stenting by Dr. Carlis Abbott via retrograde peroneal artery approach in December 2019.  At that time left lower extremity runoff demonstrated patent SFA, popliteal, and occlusion of all 3 tibial vessels.  He states the wounds of his left third and fourth toes have worsened over the past week.    PT Comments    NT getting prepared for pt to ambulate to bathroom on entry. When PT introduced, pt reports "Oh, no. I'm not doing any therapy today." Pt with increased bilateral foot pain with donning socks over clean, dry gauze wrapped feet. PT assisted pt to EoB with minA, pt able to stand to RW with mod I and ambulate into bathroom with min guard. After using bathroom, pt only agreeable to walking back and sitting in recliner until his procedure this afternoon. PT will follow acutely.   Follow Up Recommendations  Home health PT;Supervision/Assistance - 24 hour     Equipment Recommendations  None recommended by PT       Precautions / Restrictions Precautions Precautions: Fall Restrictions Weight Bearing Restrictions: No    Mobility  Bed Mobility Overal bed mobility: Needs Assistance Bed Mobility: Supine to Sit     Supine to sit: Min assist     General bed mobility comments: min A pt asked for help to get to EoB and pulled up on therapist arm  Transfers Overall transfer level: Independent                  Ambulation/Gait Ambulation/Gait assistance: Min guard Gait  Distance (Feet): 8 Feet Assistive device: Rolling walker (2 wheeled) Gait Pattern/deviations: Step-through pattern;Decreased stride length;Wide base of support Gait velocity: slowed Gait velocity interpretation: <1.8 ft/sec, indicate of risk for recurrent falls General Gait Details: min guard for safety, increased grimace with ambultion         Balance Overall balance assessment: Needs assistance Sitting-balance support: No upper extremity supported;Feet supported Sitting balance-Leahy Scale: Fair     Standing balance support: Bilateral upper extremity supported;During functional activity Standing balance-Leahy Scale: Poor Standing balance comment: relies on UE support for balance                            Cognition Arousal/Alertness: Awake/alert Behavior During Therapy: WFL for tasks assessed/performed Overall Cognitive Status: Within Functional Limits for tasks assessed                                           General Comments General comments (skin integrity, edema, etc.): bilateral feet wrapped in gauze, pt with pain in feet with donning socks for ambulation       Pertinent Vitals/Pain Pain Assessment: Faces Faces Pain Scale: Hurts little more Pain Location: bilateral feet with ambulation  Pain Descriptors / Indicators: Guarding;Grimacing Pain Intervention(s): Limited activity within patient's tolerance;Monitored during session;Repositioned           PT Goals (current  goals can now be found in the care plan section) Acute Rehab PT Goals Patient Stated Goal: to go home PT Goal Formulation: With patient Time For Goal Achievement: 03/09/19 Potential to Achieve Goals: Good Progress towards PT goals: Progressing toward goals    Frequency    Min 3X/week      PT Plan Current plan remains appropriate       AM-PAC PT "6 Clicks" Mobility   Outcome Measure  Help needed turning from your back to your side while in a flat bed  without using bedrails?: None Help needed moving from lying on your back to sitting on the side of a flat bed without using bedrails?: A Little Help needed moving to and from a bed to a chair (including a wheelchair)?: A Little Help needed standing up from a chair using your arms (e.g., wheelchair or bedside chair)?: A Little Help needed to walk in hospital room?: A Little Help needed climbing 3-5 steps with a railing? : A Little 6 Click Score: 19    End of Session Equipment Utilized During Treatment: Gait belt Activity Tolerance: Patient limited by pain Patient left: with call bell/phone within reach;in chair;with chair alarm set Nurse Communication: Mobility status PT Visit Diagnosis: Muscle weakness (generalized) (M62.81)     Time: 1250-1305 PT Time Calculation (min) (ACUTE ONLY): 15 min  Charges:  $Gait Training: 8-22 mins                     Shadow Stiggers B. Migdalia Dk PT, DPT Acute Rehabilitation Services Pager [phone] Office [phone]    [place] 02/25/2019, 1:52 PM

## 2019-02-25 NOTE — Plan of Care (Signed)

## 2019-02-25 NOTE — Progress Notes (Signed)
Site area: Right groin 6 french arterial sheath was removed by Alison Murray RN  Site Prior to Removal:  Level 0  Pressure Applied For 20 MINUTES    Bedrest Beginning at 1755p  Manual:   Yes.    Patient Status During Pull:  stable  Post Pull Groin Site:  Level 0  Post Pull Instructions Given:  Yes.    Post Pull Pulses Present:  Yes.    Dressing Applied:  Yes.    Comments:

## 2019-02-25 NOTE — Procedures (Signed)
Patient was seen on dialysis and the procedure was supervised.  BFR 350  Via TDC BP is  113/61.   Patient appears to be tolerating treatment well  Louis Meckel 02/25/2019

## 2019-02-25 NOTE — Progress Notes (Signed)
PROGRESS NOTE   Timothy Faulkner  IWO:[id number]    DOB: Aug 13, 1945    DOA: 02/22/2019  PCP: [name], MD   I have briefly reviewed patients previous medical records in [facility].  Chief Complaint  Patient presents with   Wound Check    Brief Narrative:  73 year old male, lives with spouse and daughter, ambulates with help of a walker, PMH of ESRD on MWF HD, PAD s/p right peroneal stent and right fifth toe amputation for osteomyelitis, anemia, gout, chronic systolic CHF, AICD, HLD, HTN,?  DM 2, chronic hypotension, presented with several days history of left third and fourth toe darkish discoloration, wound with foul-smelling drainage, worsened in the day or 2 prior to admission, seen by OP podiatry and referred to ED for admission.  Admitted for suspected left third and fourth toe gangrene with cellulitis.  Vascular surgery consulted and plan arteriogram 7/17.  Nephrology consulted for HD needs.   Assessment & Plan:   Principal Problem:   Cellulitis of left foot Active Problems:   Automatic implantable cardioverter-defibrillator in situ   ESRD (end stage renal disease) on dialysis ([place])   Anemia associated with chronic renal failure   Cardiomyopathy, dilated, nonischemic (HCC)   PAD (peripheral artery disease) (HCC)   Gangrene of toe of left foot (HCC)   Hypotension   PAD with suspected left third and fourth toe gangrene, superficial wound with cellulitis.  X-ray without acute findings.  Empirically started on IV vancomycin and Zosyn per pharmacy which he is getting across HD.  Blood cultures x2: Negative to date.  Vascular surgery input appreciated.  Plan for arteriogram, possible revascularization of left lower extremity and amputation of left third and fourth toes on 7/17 at 1 PM.  Patient undergoing dialysis this morning and is n.p.o. for procedure this afternoon.  Continue aspirin and Plavix.  S/p right fifth toe amputation, chronic right lateral foot  wound  Nonhealing ulcer but clean without clinical evidence of infection.  X-ray without features of osteomyelitis.  Continue wound care and outpatient follow-up.  Vascular surgery and podiatry input appreciated.  ESRD on MWF HD  Nephrology consulting for HD needs.  Last HD was on 7/15 and undergoing HD 7/17 morning.  Dialyzed via left IJ tunneled HD catheter.  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  Patient follows up with [name], Nephrology.  Patient lost his PIV access.  Do not see need for long-term IV access at this time.  Discussed in detail with [name], Nephrology, may use HD catheter for emergency IV needs, avoid PICC line due to history of multiple failed AV fistulas and if IV access absolutely warranted then can consider central line.  Hyperkalemia/AG metabolic acidosis  Secondary to ESRD on HD.  Potassium 5.5 on admission, now resolved.  Chronic hypotension  Stable on midodrine, continue.    Patient gets midodrine only on dialysis days.   Patient was hypotensive yesterday but asymptomatic and received a dose of midodrine on non-dialysis day.  Blood pressure is normal today.  Chronic systolic CHF/ICD/PPM  Compensated.  Volume management across HD.  No recent TTE in system, can consider doing outpatient.  Follows with [name], patient's PCP/Cardiology.  I updated [name] on 7/15 regarding patient's admission when he wanted the hospitalist to continue his care.  Diet-controlled DM 2 with renal complications  N0I 37/0/4888: 4.7.  CBGs well controlled on diet  alone, discontinued CBG checks.   DVT prophylaxis: SCD Code Status: Full Family Communication: I called and discussed with patient's daughter via phone on 7/15, updated care and answered questions. Disposition: To be determined pending clinical improvement   Consultants:  Vascular  surgery Nephrology  Procedures:  HD on 7/15  Antimicrobials:  IV vancomycin and Zosyn   Subjective: Patient was seen this morning at HD.  Denies complaints.  No pain in his feet.  Denies dizziness, lightheadedness, chest pain or dyspnea.  Patient is NPO for procedure this afternoon and feels hungry but knows not to eat or drink.  Objective:  Vitals:   02/25/19 0900 02/25/19 0930 02/25/19 1000 02/25/19 1030  BP: (!) 141/51 113/61 (!) 118/47 (!) 97/53  Pulse: (!) 102 (!) 102 (!) 103 (!) 103  Resp: 18 16 14  (!) 21  Temp:      TempSrc:      SpO2:      Weight:      Height:        Examination:  General exam: Pleasant elderly male, moderately built and nourished lying comfortably supine in bed undergoing dialysis. Respiratory system: Clear to auscultation.  No wheezing, rhonchi or crackles.  No increased work of breathing.  Left IJ tunneled HD catheter without acute findings.  Stable without change. Cardiovascular system: S1 and S2 heard, RRR.  No JVD, murmurs or pedal edema.  Telemetry has been discontinued in his regular bed but on it at dialysis and shows NSR. Gastrointestinal system: Abdomen is nondistended, soft and nontender.  No organomegaly or masses appreciated.  Normal bowel sounds heard. Central nervous system: Alert and oriented. No focal neurological deficits. Extremities: Symmetric 5 x 5 power. Skin: As examined on 7/15: Left third and fourth toe with black discoloration, superficial wound on the medial aspect of the left fourth toe. No bleeding.  Unable to palpate left dorsalis pedis or posterior tibial pulses.  Right foot s/p fifth toe amputation with healed site.  Right lateral forefoot with large deep but clean wound without acute findings of infection.  Please see pictures below from admission.  Dressing on both feet clean and dry.  Palpable bilateral popliteal pulses.  No change in lower extremity exam. Psychiatry: Judgement and insight appear normal. Mood & affect  appropriate.         Data Reviewed: I have personally reviewed following labs and imaging studies  CBC: Recent Labs  Lab 02/22/19 1900 02/23/19 0241 02/25/19 0740  WBC 8.9 8.2 9.1  NEUTROABS 6.9  --   --   HGB 14.2 14.2 12.8*  HCT 46.6 44.5 40.9  MCV 103.1* 99.1 100.7*  PLT 290 260 916   Basic Metabolic Panel: Recent Labs  Lab 02/22/19 1900 02/23/19 0241 02/25/19 0740  NA 132* 133* 134*  K 5.5* 5.0 3.8  CL 96* 99 99  CO2 18* 18* 19*  GLUCOSE 125* 103* 81  BUN 50* 56* 45*  CREATININE 9.64* 9.77* 9.04*  CALCIUM 9.7 9.4 9.0  PHOS  --   --  2.7   Liver Function Tests: Recent Labs  Lab 02/22/19 1900 02/25/19 0740  AST 18  --   ALT 16  --   ALKPHOS 58  --   BILITOT 0.6  --   PROT 8.6*  --   ALBUMIN 3.4* 2.8*   CBG: Recent Labs  Lab 02/24/19 0641 02/24/19 1122 02/24/19 1620 02/24/19 2152 02/25/19 0614  GLUCAP 77 137* 109* 113* 71    Recent Results (from the past 240 hour(s))  Blood culture (routine x 2)     Status: None (Preliminary result)   Collection Time: 02/22/19  7:45 PM   Specimen: BLOOD  Result Value Ref Range Status   Specimen Description BLOOD BLOOD LEFT WRIST  Final   Special Requests   Final    BOTTLES DRAWN AEROBIC AND ANAEROBIC Blood Culture adequate volume   Culture   Final    NO GROWTH 2 DAYS Performed at [facility], [address] [place], [place] [address]    Report Status PENDING  Incomplete  Blood culture (routine x 2)     Status: None (Preliminary result)   Collection Time: 02/22/19  8:20 PM   Specimen: BLOOD RIGHT HAND  Result Value Ref Range Status   Specimen Description BLOOD RIGHT HAND  Final   Special Requests   Final    BOTTLES DRAWN AEROBIC AND ANAEROBIC Blood Culture results may not be optimal due to an inadequate volume of blood received in culture bottles   Culture   Final    NO GROWTH 2 DAYS Performed at [facility], [place] [address], [place], [place] [address]    Report Status PENDING   Incomplete  SARS Coronavirus 2 (CEPHEID - Performed in [place] hospital lab), Hosp Order     Status: None   Collection Time: 02/22/19  9:29 PM   Specimen: Nasopharyngeal Swab  Result Value Ref Range Status   SARS Coronavirus 2 NEGATIVE NEGATIVE Final    Comment: (NOTE) If result is NEGATIVE SARS-CoV-2 target nucleic acids are NOT DETECTED. The SARS-CoV-2 RNA is generally detectable in upper and lower  respiratory specimens during the acute phase of infection. The lowest  concentration of SARS-CoV-2 viral copies this assay can detect is 250  copies / mL. A negative result does not preclude SARS-CoV-2 infection  and should not be used as the sole basis for treatment or other  patient management decisions.  A negative result may occur with  improper specimen collection / handling, submission of specimen other  than nasopharyngeal swab, presence of viral mutation(s) within the  areas targeted by this assay, and inadequate number of viral copies  (<250 copies / mL). A negative result must be combined with clinical  observations, patient history, and epidemiological information. If result is POSITIVE SARS-CoV-2 target nucleic acids are DETECTED. The SARS-CoV-2 RNA is generally detectable in upper and lower  respiratory specimens dur ing the acute phase of infection.  Positive  results are indicative of active infection with SARS-CoV-2.  Clinical  correlation with patient history and other diagnostic information is  necessary to determine patient infection status.  Positive results do  not rule out bacterial infection or co-infection with other viruses. If result is PRESUMPTIVE POSTIVE SARS-CoV-2 nucleic acids MAY BE PRESENT.   A presumptive positive result was obtained on the submitted specimen  and confirmed on repeat testing.  While 2019 novel coronavirus  (SARS-CoV-2) nucleic acids may be present in the submitted sample  additional confirmatory testing may be necessary for  epidemiological  and / or clinical management purposes  to differentiate between  SARS-CoV-2 and other Sarbecovirus currently known to infect humans.  If clinically indicated additional testing with an alternate test  methodology [phone]) is advised. The SARS-CoV-2 RNA is generally  detectable in upper and lower respiratory sp ecimens during the acute  phase of infection. The expected result is Negative. Fact Sheet for Patients:  StrictlyIdeas.no Fact Sheet for Healthcare Providers: BankingDealers.co.za This test is not yet approved  or cleared by the [place] and has been authorized for detection and/or diagnosis of SARS-CoV-2 by FDA under an Emergency Use Authorization (EUA).  This EUA will remain in effect (meaning this test can be used) for the duration of the COVID-19 declaration under Section 564(b)(1) of the Act, 21 U.S.C. section 360bbb-3(b)(1), unless the authorization is terminated or revoked sooner. Performed at [facility], [place] [address], [place], [place] [address]   MRSA PCR Screening     Status: None   Collection Time: 02/23/19  2:16 AM   Specimen: Nasal Mucosa; Nasopharyngeal  Result Value Ref Range Status   MRSA by PCR NEGATIVE NEGATIVE Final    Comment:        The GeneXpert MRSA Assay (FDA approved for NASAL specimens only), is one component of a comprehensive MRSA colonization surveillance program. It is not intended to diagnose MRSA infection nor to guide or monitor treatment for MRSA infections. Performed at [facility], [place] [address], [place], [place] [address]          Radiology Studies: Vas [place] Abi With/wo Tbi  Result Date: 02/24/2019 LOWER EXTREMITY DOPPLER STUDY Indications: Ulceration, and gangrene. High Risk Factors: Hypertension, hyperlipidemia, Diabetes.  Comparison Study: previous 07/14/18 Performing Technologist: Abram Sander RVS  Examination Guidelines: A complete  evaluation includes at minimum, Doppler waveform signals and systolic blood pressure reading at the level of bilateral brachial, anterior tibial, and posterior tibial arteries, when vessel segments are accessible. Bilateral testing is considered an integral part of a complete examination. Photoelectric Plethysmograph (PPG) waveforms and toe systolic pressure readings are included as required and additional duplex testing as needed. Limited examinations for reoccurring indications may be performed as noted.  ABI Findings: +--------+------------------+-----+-------------------+-----------+  Right    Rt Pressure (mmHg) Index Waveform            Comment      +--------+------------------+-----+-------------------+-----------+  Brachial 143                      triphasic                        +--------+------------------+-----+-------------------+-----------+  PTA      73                 0.51  dampened monophasic              +--------+------------------+-----+-------------------+-----------+  DP                                                    not audible  +--------+------------------+-----+-------------------+-----------+ +--------+------------------+-----+-------------------+-------------------+  Left     Lt Pressure (mmHg) Index Waveform            Comment              +--------+------------------+-----+-------------------+-------------------+  Brachial                          triphasic           unable to obtain bp  +--------+------------------+-----+-------------------+-------------------+  PTA      48                 0.34  dampened monophasic                      +--------+------------------+-----+-------------------+-------------------+  DP                                                    not audible          +--------+------------------+-----+-------------------+-------------------+ +-------+-----------+-----------+------------+------------+  ABI/TBI Today's ABI Today's TBI Previous ABI Previous TBI   +-------+-----------+-----------+------------+------------+  Right   0.51                                               +-------+-----------+-----------+------------+------------+  Left    0.34                                               +-------+-----------+-----------+------------+------------+  Summary: Right: Resting right ankle-brachial index indicates moderate right lower extremity arterial disease. Left: Resting left ankle-brachial index indicates severe left lower extremity arterial disease.  *See table(s) above for measurements and observations.  Electronically signed by Harold Barban MD on 02/24/2019 at 1:12:58 PM.   Final         Scheduled Meds:  aspirin EC  81 mg Oral Daily   calcitRIOL  0.5 mcg Oral Q M,W,F-HD   Chlorhexidine Gluconate Cloth  6 each Topical Q0600   Chlorhexidine Gluconate Cloth  6 each Topical Q0600   cinacalcet  30 mg Oral Q M,W,F-HD   clopidogrel  75 mg Oral Q breakfast   ferric citrate  420 mg Oral TID WC   gabapentin  100 mg Oral QHS   heparin       midodrine  10 mg Oral Q M,W,F-HD   multivitamin  1 tablet Oral QHS   pantoprazole  40 mg Oral Q0600   vancomycin variable dose per unstable renal function (pharmacist dosing)   Does not apply See admin instructions   Continuous Infusions:  piperacillin-tazobactam (ZOSYN)  IV Stopped (02/24/19 2039)   vancomycin 750 mg (02/25/19 1000)     LOS: 3 days     Vernell Leep, MD, FACP, [facility]. [facility]  To contact the attending provider between 7A-7P or the covering provider during after hours 7P-7A, please log into the web site [email or web address] and access using universal [place] password for that web site. If you do not have the password, please call the hospital operator.  02/25/2019, 10:59 AM

## 2019-02-25 NOTE — Interval H&P Note (Signed)
History and Physical Interval Note:  02/25/2019 1:34 PM  Timothy Faulkner  has presented today for surgery, with the diagnosis of PVD gang green.  The various methods of treatment have been discussed with the patient and family. After consideration of risks, benefits and other options for treatment, the patient has consented to  Procedure(s): ABDOMINAL AORTOGRAM W/LOWER EXTREMITY (Bilateral) as a surgical intervention.  The patient's history has been reviewed, patient examined, no change in status, stable for surgery.  I have reviewed the patient's chart and labs.  Questions were answered to the patient's satisfaction.     Ruta Hinds

## 2019-02-26 LAB — BASIC METABOLIC PANEL
Anion gap: 13 (ref 5–15)
BUN: 22 mg/dL (ref 8–23)
CO2: 23 mmol/L (ref 22–32)
Calcium: 8.9 mg/dL (ref 8.9–10.3)
Chloride: 98 mmol/L (ref 98–111)
Creatinine, Ser: 6.21 mg/dL — ABNORMAL HIGH (ref 0.61–1.24)
GFR calc Af Amer: 10 mL/min — ABNORMAL LOW (ref >60)
GFR calc non Af Amer: 8 mL/min — ABNORMAL LOW (ref >60)
Glucose, Bld: 146 mg/dL — ABNORMAL HIGH (ref 70–99)
Potassium: 3.7 mmol/L (ref 3.5–5.1)
Sodium: 134 mmol/L — ABNORMAL LOW (ref 135–145)

## 2019-02-26 LAB — CBC
HCT: 39.3 % (ref 39.0–52.0)
Hemoglobin: 12.5 g/dL — ABNORMAL LOW (ref 13.0–17.0)
MCH: 31.4 pg (ref 26.0–34.0)
MCHC: 31.8 g/dL (ref 30.0–36.0)
MCV: 98.7 fL (ref 80.0–100.0)
Platelets: 286 10*3/uL (ref 150–400)
RBC: 3.98 MIL/uL — ABNORMAL LOW (ref 4.22–5.81)
RDW: 15.7 % — ABNORMAL HIGH (ref 11.5–15.5)
WBC: 8.4 10*3/uL (ref 4.0–10.5)
nRBC: 0 % (ref 0.0–0.2)

## 2019-02-26 NOTE — Progress Notes (Addendum)
PROGRESS NOTE   Timothy Faulkner  VZD:[id number]    DOB: [birth year]    DOA: 02/22/2019  PCP: [name], MD   I have briefly reviewed patients previous medical records in [facility].  Chief Complaint  Patient presents with   Wound Check    Brief Narrative:  73 year old male, lives with spouse and daughter, ambulates with help of a walker, PMH of ESRD on MWF HD, PAD s/p right peroneal stent and right fifth toe amputation for osteomyelitis, anemia, gout, chronic systolic CHF, AICD, HLD, HTN,?  DM 2, chronic hypotension, presented with several days history of left third and fourth toe darkish discoloration, wound with foul-smelling drainage, worsened in the day or 2 prior to admission, seen by OP podiatry and referred to ED for admission.  Admitted for suspected left third and fourth toe gangrene with cellulitis.  Vascular surgery performed successful angioplasty of left tibioperoneal trunk and posterior tibial artery on 7/17 with plans for repeat arteriogram on 7/20 to reassess recurrent stenosis in his right leg.  Nephrology consulted for HD needs.   Assessment & Plan:   Principal Problem:   Cellulitis of left foot Active Problems:   Automatic implantable cardioverter-defibrillator in situ   ESRD (end stage renal disease) on dialysis ([place])   Anemia associated with chronic renal failure   Cardiomyopathy, dilated, nonischemic (HCC)   PAD (peripheral artery disease) (HCC)   Gangrene of toe of left foot (HCC)   Hypotension   PAD with suspected left third and fourth toe gangrene, superficial wound with cellulitis.  X-ray without acute findings.  Empirically started on IV vancomycin and Zosyn per pharmacy which he is getting across HD.  Blood cultures x2: Negative to date.  Vascular surgery performed successful angioplasty of the left tibioperoneal trunk and posterior tibial artery on 7/17.  Vascular plans repeat arteriogram to reopen the narrowing at the proximal aspect of  his right leg stent on 7/20.  Continue aspirin and Plavix.  If patient needs to amputations, Podiatry and Vascular Surgery to discuss amongst themselves as to who will be performing the procedure since patient seemed to want [name] to do it.  S/p right fifth toe amputation, chronic right lateral foot wound  Nonhealing ulcer but clean without clinical evidence of infection.  X-ray without features of osteomyelitis.  Continue wound care and outpatient follow-up.  Vascular surgery and podiatry input appreciated.  Vascular intervention on right lower extremity plan for Monday 7/20  ESRD on MWF HD  Nephrology consulting for HD needs.  Last HD 7/17 and next one will be for 7/20.  Dialyzed via left IJ tunneled HD catheter.  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  Patient follows up with [name], Nephrology.  Patient lost his PIV access.  Do not see need for long-term IV access at this time.  Discussed in detail with [name], Nephrology, may use HD catheter for emergency IV needs, avoid PICC line due to history of multiple failed AV fistulas and if IV access absolutely warranted then can consider central line.  Stable.  Hyperkalemia/AG metabolic acidosis  Secondary to ESRD on HD.  Potassium 5.5 on admission.  Resolved.  Monitor BMP periodically across HD.  Chronic hypotension  Stable on midodrine, continue.    Patient gets midodrine only on dialysis days.   Intermittent hypotension but mostly asymptomatic.  Monitor.  Chronic systolic CHF/ICD/PPM  Compensated.  Volume  management across HD.  No recent TTE in system, can consider doing outpatient.  Follows with [name], patient's PCP/Cardiology.  I updated [name] on 7/15 regarding patient's admission when he wanted the hospitalist to continue his care.  Diet-controlled DM 2 with renal complications  A4Z  66/0/6301: 4.7.  CBGs well controlled on diet alone, discontinued CBG checks.  Had asymptomatic hypoglycemia CBGs in the 72s yesterday when he was n.p.o. for procedures.  Monitor CBGs especially when he is again n.p.o. for next procedure.  Discussed with RN.   DVT prophylaxis: SCD Code Status: Full Family Communication: I called and discussed with patient's daughter via phone on 7/15, updated care and answered questions. Unable to reach spouse or daughter via phone today. Disposition: To be determined pending clinical improvement   Consultants:  Vascular surgery Nephrology  Procedures:  HD on 7/17 Angioplasty of the left tibioperoneal trunk and posterior tibial artery on 7/17.  Antimicrobials:  IV vancomycin and Zosyn   Subjective: Patient denies complaints.  No pain reported.  As per RN, no acute issues noted.  Objective:  Vitals:   02/25/19 2341 02/26/19 0346 02/26/19 0739 02/26/19 1034  BP: (!) 87/60 90/66 (!) 83/52   Pulse: (!) 106 99 93 77  Resp: (!) 26 20    Temp: 98.5 F (36.9 C) 98.6 F (37 C) (!) 97.5 F (36.4 C) 98 F (36.7 C)  TempSrc: Oral Oral Oral Oral  SpO2: 100% 100% 100% 100%  Weight:      Height:        Examination:  General exam: Pleasant elderly male, moderately built and nourished sitting up comfortably in chair this morning. Respiratory system: Clear to auscultation.  No increased work of breathing.  Left IJ tunneled HD catheter without acute findings.  Cardiovascular system: S1 and S2 heard, RRR.  No JVD, murmurs or pedal edema.  I had discontinued his telemetry couple days ago but he is back on it for unclear reasons.  Reviewed telemetry personally, sinus rhythm.  Discussed with RN to discontinue. Gastrointestinal system: Abdomen is nondistended, soft and nontender.  No organomegaly or masses appreciated.  Normal bowel sounds heard.  Stable without change. Central nervous system: Alert and oriented. No focal neurological  deficits. Extremities: Symmetric 5 x 5 power. Skin: As examined on 7/15: Left third and fourth toe with black discoloration, superficial wound on the medial aspect of the left fourth toe. No bleeding.  Unable to palpate left dorsalis pedis or posterior tibial pulses.  Right foot s/p fifth toe amputation with healed site.  Right lateral forefoot with large deep but clean wound without acute findings of infection.  Please see pictures below from admission.  Dressing on both feet clean and dry.  Palpable bilateral popliteal pulses.  No change in exam findings.  Left PT brisk by Doppler as per VVS. Psychiatry: Judgement and insight appear normal. Mood & affect appropriate.         Data Reviewed: I have personally reviewed following labs and imaging studies  CBC: Recent Labs  Lab 02/22/19 1900 02/23/19 0241 02/25/19 0740 02/26/19 0238  WBC 8.9 8.2 9.1 8.4  NEUTROABS 6.9  --   --   --   HGB 14.2 14.2 12.8* 12.5*  HCT 46.6 44.5 40.9 39.3  MCV 103.1* 99.1 100.7* 98.7  PLT 290 260 310 601   Basic Metabolic Panel: Recent Labs  Lab 02/22/19 1900 02/23/19 0241 02/25/19 0740 02/26/19 0238  NA 132* 133* 134* 134*  K 5.5* 5.0 3.8 3.7  CL 96* 99 99 98  CO2 18* 18* 19* 23  GLUCOSE 125* 103* 81 146*  BUN 50* 56* 45* 22  CREATININE 9.64* 9.77* 9.04* 6.21*  CALCIUM 9.7 9.4 9.0 8.9  PHOS  --   --  2.7  --    Liver Function Tests: Recent Labs  Lab 02/22/19 1900 02/25/19 0740  AST 18  --   ALT 16  --   ALKPHOS 58  --   BILITOT 0.6  --   PROT 8.6*  --   ALBUMIN 3.4* 2.8*   CBG: Recent Labs  Lab 02/25/19 0614 02/25/19 1135 02/25/19 1206 02/25/19 1220 02/25/19 1820  GLUCAP 71 67* 65* 76 62*    Recent Results (from the past 240 hour(s))  Blood culture (routine x 2)     Status: None (Preliminary result)   Collection Time: 02/22/19  7:45 PM   Specimen: BLOOD  Result Value Ref Range Status   Specimen Description BLOOD BLOOD LEFT WRIST  Final   Special Requests   Final     BOTTLES DRAWN AEROBIC AND ANAEROBIC Blood Culture adequate volume   Culture   Final    NO GROWTH 3 DAYS Performed at [facility], [place] [address] [place], [place] [address]    Report Status PENDING  Incomplete  Blood culture (routine x 2)     Status: None (Preliminary result)   Collection Time: 02/22/19  8:20 PM   Specimen: BLOOD RIGHT HAND  Result Value Ref Range Status   Specimen Description BLOOD RIGHT HAND  Final   Special Requests   Final    BOTTLES DRAWN AEROBIC AND ANAEROBIC Blood Culture results may not be optimal due to an inadequate volume of blood received in culture bottles   Culture   Final    NO GROWTH 3 DAYS Performed at [facility], [place] [address] [place], [place] [address]    Report Status PENDING  Incomplete  SARS Coronavirus 2 (CEPHEID - Performed in [place] hospital lab), Hosp Order     Status: None   Collection Time: 02/22/19  9:29 PM   Specimen: Nasopharyngeal Swab  Result Value Ref Range Status   SARS Coronavirus 2 NEGATIVE NEGATIVE Final    Comment: (NOTE) If result is NEGATIVE SARS-CoV-2 target nucleic acids are NOT DETECTED. The SARS-CoV-2 RNA is generally detectable in upper and lower  respiratory specimens during the acute phase of infection. The lowest  concentration of SARS-CoV-2 viral copies this assay can detect is 250  copies / mL. A negative result does not preclude SARS-CoV-2 infection  and should not be used as the sole basis for treatment or other  patient management decisions.  A negative result may occur with  improper specimen collection / handling, submission of specimen other  than nasopharyngeal swab, presence of viral mutation(s) within the  areas targeted by this assay, and inadequate number of viral copies  (<250 copies / mL). A negative result must be combined with clinical  observations, patient history, and epidemiological information. If result is POSITIVE SARS-CoV-2 target nucleic acids are DETECTED. The  SARS-CoV-2 RNA is generally detectable in upper and lower  respiratory specimens dur ing the acute phase of infection.  Positive  results are indicative of active infection with SARS-CoV-2.  Clinical  correlation with patient history and other diagnostic information is  necessary to determine patient infection status.  Positive results do  not rule out bacterial infection or co-infection with other viruses. If result is PRESUMPTIVE POSTIVE  SARS-CoV-2 nucleic acids MAY BE PRESENT.   A presumptive positive result was obtained on the submitted specimen  and confirmed on repeat testing.  While 2019 novel coronavirus  (SARS-CoV-2) nucleic acids may be present in the submitted sample  additional confirmatory testing may be necessary for epidemiological  and / or clinical management purposes  to differentiate between  SARS-CoV-2 and other Sarbecovirus currently known to infect humans.  If clinically indicated additional testing with an alternate test  methodology [phone]) is advised. The SARS-CoV-2 RNA is generally  detectable in upper and lower respiratory sp ecimens during the acute  phase of infection. The expected result is Negative. Fact Sheet for Patients:  StrictlyIdeas.no Fact Sheet for Healthcare Providers: BankingDealers.co.za This test is not yet approved or cleared by the [place] FDA and has been authorized for detection and/or diagnosis of SARS-CoV-2 by FDA under an Emergency Use Authorization (EUA).  This EUA will remain in effect (meaning this test can be used) for the duration of the COVID-19 declaration under Section 564(b)(1) of the Act, 21 U.S.C. section 360bbb-3(b)(1), unless the authorization is terminated or revoked sooner. Performed at [facility], [place] [address] [place], [place] [address]   MRSA PCR Screening     Status: None   Collection Time: 02/23/19  2:16 AM   Specimen: Nasal Mucosa; Nasopharyngeal   Result Value Ref Range Status   MRSA by PCR NEGATIVE NEGATIVE Final    Comment:        The GeneXpert MRSA Assay (FDA approved for NASAL specimens only), is one component of a comprehensive MRSA colonization surveillance program. It is not intended to diagnose MRSA infection nor to guide or monitor treatment for MRSA infections. Performed at [facility], [place] [address] [place], [place] [address]          Radiology Studies: Vas [place] Abi With/wo Tbi  Result Date: 02/24/2019 LOWER EXTREMITY DOPPLER STUDY Indications: Ulceration, and gangrene. High Risk Factors: Hypertension, hyperlipidemia, Diabetes.  Comparison Study: previous 07/14/18 Performing Technologist: Abram Sander RVS  Examination Guidelines: A complete evaluation includes at minimum, Doppler waveform signals and systolic blood pressure reading at the level of bilateral brachial, anterior tibial, and posterior tibial arteries, when vessel segments are accessible. Bilateral testing is considered an integral part of a complete examination. Photoelectric Plethysmograph (PPG) waveforms and toe systolic pressure readings are included as required and additional duplex testing as needed. Limited examinations for reoccurring indications may be performed as noted.  ABI Findings: +--------+------------------+-----+-------------------+-----------+  Right    Rt Pressure (mmHg) Index Waveform            Comment      +--------+------------------+-----+-------------------+-----------+  Brachial 143                      triphasic                        +--------+------------------+-----+-------------------+-----------+  PTA      73                 0.51  dampened monophasic              +--------+------------------+-----+-------------------+-----------+  DP  not audible  +--------+------------------+-----+-------------------+-----------+  +--------+------------------+-----+-------------------+-------------------+  Left     Lt Pressure (mmHg) Index Waveform            Comment              +--------+------------------+-----+-------------------+-------------------+  Brachial                          triphasic           unable to obtain bp  +--------+------------------+-----+-------------------+-------------------+  PTA      48                 0.34  dampened monophasic                      +--------+------------------+-----+-------------------+-------------------+  DP                                                    not audible          +--------+------------------+-----+-------------------+-------------------+ +-------+-----------+-----------+------------+------------+  ABI/TBI Today's ABI Today's TBI Previous ABI Previous TBI  +-------+-----------+-----------+------------+------------+  Right   0.51                                               +-------+-----------+-----------+------------+------------+  Left    0.34                                               +-------+-----------+-----------+------------+------------+  Summary: Right: Resting right ankle-brachial index indicates moderate right lower extremity arterial disease. Left: Resting left ankle-brachial index indicates severe left lower extremity arterial disease.  *See table(s) above for measurements and observations.  Electronically signed by Harold Barban MD on 02/24/2019 at 1:12:58 PM.   Final         Scheduled Meds:  aspirin EC  81 mg Oral Daily   calcitRIOL  0.5 mcg Oral Q M,W,F-HD   Chlorhexidine Gluconate Cloth  6 each Topical Q0600   Chlorhexidine Gluconate Cloth  6 each Topical Q0600   cinacalcet  30 mg Oral Q M,W,F-HD   clopidogrel  75 mg Oral Q breakfast   collagenase  1 application Topical Daily   ferric citrate  420 mg Oral TID WC   gabapentin  100 mg Oral QHS   midodrine  10 mg Oral Q M,W,F-HD   multivitamin  1 tablet Oral QHS   pantoprazole  40  mg Oral Q0600   sodium chloride flush  3 mL Intravenous Q12H   vancomycin variable dose per unstable renal function (pharmacist dosing)   Does not apply See admin instructions   Continuous Infusions:  sodium chloride     piperacillin-tazobactam (ZOSYN)  IV 3.375 g (02/26/19 0236)   vancomycin Stopped (02/23/19 1857)     LOS: 4 days     Vernell Leep, MD, FACP, [facility]. [facility]  To contact the attending provider between 7A-7P or the covering provider during after hours 7P-7A, please log into the web site [email or web address] and access using universal [place] password for that web site. If you do not have the password, please  call the hospital operator.  02/26/2019, 11:15 AM

## 2019-02-26 NOTE — Progress Notes (Signed)
Subjective:  S/p arteriogram- angioplasty of left tibioperoneal trunk and posterior tibial artery.  Also HD yest- removed 1500.  BP some low this AM but no sxms   Objective Vital signs in last 24 hours: Vitals:   02/25/19 1943 02/25/19 2255 02/25/19 2341 02/26/19 0346  BP: (!) 97/57  (!) 87/60 90/66  Pulse: (!) 113  (!) 106 99  Resp: 16 17 (!) 26 20  Temp: 98.2 F (36.8 C)  98.5 F (36.9 C) 98.6 F (37 C)  TempSrc: Oral  Oral Oral  SpO2: 98%  100% 100%  Weight:      Height:       Weight change:   Intake/Output Summary (Last 24 hours) at 02/26/2019 0659 Last data filed at 02/26/2019 0600 Gross per 24 hour  Intake 872.28 ml  Output 1501 ml  Net -628.72 ml    Dialysis Orders:  MWF - [place]  4hrs, BFR 400, DFR 800,  EDW 79kg, 3K/ 2.5Ca  Access: R IJ TDC  Heparin 5000, 2000 intermittent Calcitriol 0.5 mcg PO qHD   Assessment/Plan: 1.  Cellulitis w/critical limb ischemia w/gangreous toes of L foot: VVS consulted,  Angiogram with angioplasty 7/17.  vanc and zosyn started. Per Dr. Oneida Alar- will need intervention on right leg on Monday.  Mention of possibly needing toe amputations as well ?  2.  ESRD -  On HD MWF. K5.0.  HD yest per regular schedule. Next planned for Monday 3.  Hypertension/volume  - BP well controlled. On midodrine pre HD. Volume status stable, close to EDW.   4.  Anemia of CKD - Hgb 14.2--> 12. No indication for ESA at this time.  5.  Secondary Hyperparathyroidism -  Ca in goal.   phos low. Continue calcitriol and binders - auryxia 3AC TID- -  dec to 2 TID. Also on sensipar 6.  Nutrition - Renal diet with fluid restrictions, vit 7. DMT2 8. Hx PVD s/p stent placement in Dec 2019 for RLE - poorly healing wound -  Needing intervention on Monday ?   9. Chronic systolic HD s/p ICD placement    Timothy Faulkner    Labs: Basic Metabolic Panel: Recent Labs  Lab 02/23/19 0241 02/25/19 0740 02/26/19 0238  NA 133* 134* 134*  K 5.0 3.8 3.7  CL 99 99 98   CO2 18* 19* 23  GLUCOSE 103* 81 146*  BUN 56* 45* 22  CREATININE 9.77* 9.04* 6.21*  CALCIUM 9.4 9.0 8.9  PHOS  --  2.7  --    Liver Function Tests: Recent Labs  Lab 02/22/19 1900 02/25/19 0740  AST 18  --   ALT 16  --   ALKPHOS 58  --   BILITOT 0.6  --   PROT 8.6*  --   ALBUMIN 3.4* 2.8*   No results for input(s): LIPASE, AMYLASE in the last 168 hours. No results for input(s): AMMONIA in the last 168 hours. CBC: Recent Labs  Lab 02/22/19 1900 02/23/19 0241 02/25/19 0740 02/26/19 0238  WBC 8.9 8.2 9.1 8.4  NEUTROABS 6.9  --   --   --   HGB 14.2 14.2 12.8* 12.5*  HCT 46.6 44.5 40.9 39.3  MCV 103.1* 99.1 100.7* 98.7  PLT 290 260 310 286   Cardiac Enzymes: No results for input(s): CKTOTAL, CKMB, CKMBINDEX, TROPONINI in the last 168 hours. CBG: Recent Labs  Lab 02/25/19 0614 02/25/19 1135 02/25/19 1206 02/25/19 1220 02/25/19 1820  GLUCAP 71 67* 65* 76 62*    Iron Studies:  No results for input(s): IRON, TIBC, TRANSFERRIN, FERRITIN in the last 72 hours. Studies/Results: Vas [place] Burnard Bunting With/wo Tbi  Result Date: 02/24/2019 LOWER EXTREMITY DOPPLER STUDY Indications: Ulceration, and gangrene. High Risk Factors: Hypertension, hyperlipidemia, Diabetes.  Comparison Study: previous 07/14/18 Performing Technologist: Abram Sander RVS  Examination Guidelines: A complete evaluation includes at minimum, Doppler waveform signals and systolic blood pressure reading at the level of bilateral brachial, anterior tibial, and posterior tibial arteries, when vessel segments are accessible. Bilateral testing is considered an integral part of a complete examination. Photoelectric Plethysmograph (PPG) waveforms and toe systolic pressure readings are included as required and additional duplex testing as needed. Limited examinations for reoccurring indications may be performed as noted.  ABI Findings: +--------+------------------+-----+-------------------+-----------+ Right   Rt Pressure  (mmHg)IndexWaveform           Comment     +--------+------------------+-----+-------------------+-----------+ [id number]                    triphasic                      +--------+------------------+-----+-------------------+-----------+ PTA     73                0.51 dampened monophasic            +--------+------------------+-----+-------------------+-----------+ DP                                                not audible +--------+------------------+-----+-------------------+-----------+ +--------+------------------+-----+-------------------+-------------------+ Left    Lt Pressure (mmHg)IndexWaveform           Comment             +--------+------------------+-----+-------------------+-------------------+ Brachial                       triphasic          unable to obtain bp +--------+------------------+-----+-------------------+-------------------+ PTA     48                0.34 dampened monophasic                    +--------+------------------+-----+-------------------+-------------------+ DP                                                not audible         +--------+------------------+-----+-------------------+-------------------+ +-------+-----------+-----------+------------+------------+ ABI/TBIToday's ABIToday's TBIPrevious ABIPrevious TBI +-------+-----------+-----------+------------+------------+ Right  0.51                                           +-------+-----------+-----------+------------+------------+ Left   0.34                                           +-------+-----------+-----------+------------+------------+  Summary: Right: Resting right ankle-brachial index indicates moderate right lower extremity arterial disease. Left: Resting left ankle-brachial index indicates severe left lower extremity arterial disease.  *See table(s) above for measurements and observations.  Electronically signed by Harold Barban MD on 02/24/2019 at  1:12:58 PM.  Final    Medications: Infusions: . sodium chloride    . piperacillin-tazobactam (ZOSYN)  IV 3.375 g (02/26/19 0236)  . vancomycin Stopped (02/23/19 1857)    Scheduled Medications: . aspirin EC  81 mg Oral Daily  . calcitRIOL  0.5 mcg Oral Q M,W,F-HD  . Chlorhexidine Gluconate Cloth  6 each Topical Q0600  . Chlorhexidine Gluconate Cloth  6 each Topical Q0600  . cinacalcet  30 mg Oral Q M,W,F-HD  . clopidogrel  75 mg Oral Q breakfast  . collagenase  1 application Topical Daily  . ferric citrate  420 mg Oral TID WC  . gabapentin  100 mg Oral QHS  . midodrine  10 mg Oral Q M,W,F-HD  . multivitamin  1 tablet Oral QHS  . pantoprazole  40 mg Oral Q0600  . sodium chloride flush  3 mL Intravenous Q12H  . vancomycin variable dose per unstable renal function (pharmacist dosing)   Does not apply See admin instructions    have reviewed scheduled and prn medications.  Physical Exam: General: NAD Heart: RRR Lungs: mostly clear Abdomen: soft, non tender Extremities: no edema-  Both feet bandaged  Dialysis Access: TDC     02/26/2019,6:59 AM  LOS: 4 days

## 2019-02-26 NOTE — Progress Notes (Signed)
[facility] [place]  Subjective  - No complaints.   Objective (!) 83/52 93 (!) 97.5 F (36.4 C) (Oral) 20 100%  Intake/Output Summary (Last 24 hours) at 02/26/2019 0913 Last data filed at 02/26/2019 4720 Gross per 24 hour  Intake 1112.28 ml  Output 1501 ml  Net -388.72 ml    Right groin c/d/i - no hemaotma Left PT brisk by doppler - gangrene left 3rd-4th toes, no drainage Slow to heal right 5th toe amp  Laboratory Lab Results: Recent Labs    02/25/19 0740 02/26/19 0238  WBC 9.1 8.4  HGB 12.8* 12.5*  HCT 40.9 39.3  PLT 310 286   BMET Recent Labs    02/25/19 0740 02/26/19 0238  NA 134* 134*  K 3.8 3.7  CL 99 98  CO2 19* 23  GLUCOSE 81 146*  BUN 45* 22  CREATININE 9.04* 6.21*  CALCIUM 9.0 8.9    COAG Lab Results  Component Value Date   INR 1.24 03/12/2013   INR 1.27 08/24/2012   INR 0.9 ratio 06/21/2010   No results found for: PTT  Assessment/Planning:  Doing well POD#1 s/p left PT/Tp trunk angioplasty for CLI with tissue loss LLE.  Has brisk PT by doppler in left foot.  Plan to return to cath lab on Monday with Dr. Donzetta Matters for right peroneal intervention (previous right peroneal retrograde intervention) and evidence of stenosis on arteriogram. Continue aspirin and plavix.  [name] 02/26/2019 9:13 AM --

## 2019-02-26 NOTE — Plan of Care (Signed)

## 2019-02-27 LAB — CULTURE, BLOOD (ROUTINE X 2)
Culture: NO GROWTH
Culture: NO GROWTH
Special Requests: ADEQUATE

## 2019-02-27 LAB — GLUCOSE, CAPILLARY
Glucose-Capillary: 136 mg/dL — ABNORMAL HIGH (ref 70–99)
Glucose-Capillary: 205 mg/dL — ABNORMAL HIGH (ref 70–99)

## 2019-02-27 MED ORDER — CHLORHEXIDINE GLUCONATE CLOTH 2 % EX PADS
6.0000 | MEDICATED_PAD | Freq: Every day | CUTANEOUS | Status: DC
  Administered 2019-03-01: 6 via TOPICAL

## 2019-02-27 NOTE — Progress Notes (Signed)
  Subjective:  Patient ID: Timothy Faulkner, male    DOB: 09-19-1945,  MRN: [id number]  Seen at bedside. Eating dinner. Denies complaints.. Objective:   Vitals:   02/27/19 1050 02/27/19 1518  BP: 114/71 112/74  Pulse:  (!) 103  Resp: 19 19  Temp: 97.9 F (36.6 C) 98.1 F (36.7 C)  SpO2:  94%   General AA&O x3. Normal mood and affect.  Vascular Dorsalis pedis and posterior tibial pulses non-palpable. Absent pedal hair. Pallor left 3rd/4th toes.  Neurologic Epicritic sensation grossly diminished.  Dermatologic Dressings c/d/i bilateral feet. 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 Left foot warm to touch.  Orthopedic: Calves supple, no tenderness. Positive motor to level of the digits.   Assessment & Plan:  Patient was evaluated and treated and all questions answered.  Gangrene Left 3rd/4th Toes -S/p revasc -Continue Betadine WTD daily -Would plan for amputation to be performed Thursday given sx procedure Monday, HD Wednesday. Would avoid Tuesday d/t repeat anesthesia. Would likely be ok for d/c Friday. 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 -Continue Santyl WTD to be applied daily. -WBAT in surgical shoe.  [name], DPM  Accessible via secure chat for questions or concerns.

## 2019-02-27 NOTE — Progress Notes (Signed)
PROGRESS NOTE   Timothy Faulkner  KZL:[id number]    DOB: February 22, 1946    DOA: 02/22/2019  PCP: [name], MD   I have briefly reviewed patients previous medical records in [facility].  Chief Complaint  Patient presents with   Wound Check    Brief Narrative:  73 year old male, lives with spouse and daughter, ambulates with help of a walker, PMH of ESRD on MWF HD, PAD s/p right peroneal stent and right fifth toe amputation for osteomyelitis, anemia, gout, chronic systolic CHF, AICD, HLD, HTN,?  DM 2, chronic hypotension, presented with several days history of left third and fourth toe darkish discoloration, wound with foul-smelling drainage, worsened in the day or 2 prior to admission, seen by OP podiatry and referred to ED for admission.  Admitted for suspected left third and fourth toe gangrene with cellulitis.  Vascular surgery performed successful angioplasty of left tibioperoneal trunk and posterior tibial artery on 7/17 with plans for repeat arteriogram on 7/20 for right peroneal intervention for nonhealing wound.  Nephrology consulted for HD needs.   Assessment & Plan:   Principal Problem:   Cellulitis of left foot Active Problems:   Automatic implantable cardioverter-defibrillator in situ   ESRD (end stage renal disease) on dialysis ([name])   Anemia associated with chronic renal failure   Cardiomyopathy, dilated, nonischemic (HCC)   PAD (peripheral artery disease) (HCC)   Gangrene of toe of left foot (HCC)   Hypotension   PAD with suspected left third and fourth toe gangrene, superficial wound with cellulitis.  X-ray without acute findings.  Empirically started on IV vancomycin and Zosyn per pharmacy which he is getting across HD.  Blood cultures x2: Negative, final report.  Vascular surgery performed successful angioplasty of the left tibioperoneal trunk and posterior tibial artery on 7/17.  Vascular plans repeat arteriogram to reopen the narrowing at the proximal  aspect of his right leg stent on 7/20.  Plan for right peroneal intervention for nonhealing wound 7/20.  Continue aspirin and Plavix.  If patient needs to amputations, Podiatry and Vascular Surgery to discuss amongst themselves as to who will be performing the procedure since patient seemed to want [name] to do it.  S/p right fifth toe amputation, chronic right lateral foot wound  Nonhealing ulcer but clean without clinical evidence of infection.  X-ray without features of osteomyelitis.  Continue wound care and outpatient follow-up.  Vascular surgery and podiatry input appreciated.  Vascular intervention on right lower extremity plan for Monday 7/20  ESRD on MWF HD  Nephrology consulting for HD needs.  Last HD 7/17 and next one will be for 7/20.  Dialyzed via left IJ tunneled HD catheter.  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  Patient follows up with [name], Nephrology.  Patient lost his PIV access.  Do not see need for long-term IV access at this time.  Discussed in detail with [name], Nephrology, may use HD catheter for emergency IV needs, avoid PICC line due to history of multiple failed AV fistulas and if IV access absolutely warranted then can consider central line.  Nephrology plans HD at first shift on 7/20.  Timing of vascular intervention unknown at this time.  Hyperkalemia/AG metabolic acidosis  Secondary to ESRD on HD.  Potassium 5.5 on admission.  Resolved.  Monitor BMP periodically across HD.  Chronic hypotension  Stable on midodrine, continue.  Patient gets midodrine only on dialysis days.   Intermittent hypotension but mostly asymptomatic.  Monitor.  Chronic systolic CHF/ICD/PPM  Compensated.  Volume management across HD.  No recent TTE in system, can consider doing outpatient.  Follows with [name], patient's PCP/Cardiology.  I  updated [name] on 7/15 regarding patient's admission when he wanted the hospitalist to continue his care.  Diet-controlled DM 2 with renal complications  F0Y 77/11/1285: 4.7.  CBGs well controlled on diet alone, discontinued CBG checks.  Had asymptomatic hypoglycemia CBGs in the 60s on 7/17 when he was n.p.o. for procedures.  CBGs ordered but were never done yesterday.  Discussed with RN today, 136 this morning.  More importantly may need to monitor CBGs while he is n.p.o.  Remove diabetic diet restrictions.   DVT prophylaxis: SCD Code Status: Full Family Communication: I called and discussed with patient's daughter via phone on 7/18, updated care and answered questions.  She was appreciative of the phone call and updates. Disposition: To be determined pending clinical improvement   Consultants:  Vascular surgery Nephrology  Procedures:  HD on 7/17 Angioplasty of the left tibioperoneal trunk and posterior tibial artery on 7/17.  Antimicrobials:  IV vancomycin and Zosyn   Subjective: Patient reportedly did not sleep well last night for no particular reasons.  Denies complaints.  As per RN, no acute issues noted.  No pain reported.  Objective:  Vitals:   02/26/19 1443 02/26/19 1900 02/26/19 2000 02/27/19 0853  BP: (!) 84/62 (!) 113/44 (!) 113/44   Pulse: 90 92 (!) 192   Resp:  12  18  Temp: 98.2 F (36.8 C) 98.2 F (36.8 C)  97.9 F (36.6 C)  TempSrc: Oral Oral  Axillary  SpO2: 95% 99% (!) 82% (!) 89%  Weight:      Height:        Examination:  General exam: Pleasant elderly male, moderately built and nourished lying comfortably supine in bed. Respiratory system: Clear to auscultation.  No increased work of breathing.  Left IJ tunneled HD catheter without acute findings.  Cardiovascular system: S1 and S2 heard, RRR.  No JVD, murmurs or pedal edema.  Not on telemetry at this time. Gastrointestinal system: Abdomen is nondistended, soft and nontender.  No  organomegaly or masses appreciated.  Normal bowel sounds heard.  Stable without change. Central nervous system: Alert and oriented. No focal neurological deficits. Extremities: Symmetric 5 x 5 power. Skin: As examined on 7/15: Left third and fourth toe with black discoloration, superficial wound on the medial aspect of the left fourth toe. No bleeding.  Unable to palpate left dorsalis pedis or posterior tibial pulses.  Right foot s/p fifth toe amputation with healed site.  Right lateral forefoot with large deep but clean wound without acute findings of infection.  Please see pictures below from admission.  Dressing on both feet clean and dry.  Palpable bilateral popliteal pulses.  No change in lower extremity exam findings.  Left PT brisk by Doppler as per VVS. Psychiatry: Judgement and insight appear normal. Mood & affect appropriate.         Data Reviewed: I have personally reviewed following labs and imaging studies  CBC: Recent Labs  Lab 02/22/19 1900 02/23/19 0241 02/25/19 0740 02/26/19 0238  WBC 8.9 8.2 9.1 8.4  NEUTROABS 6.9  --   --   --   HGB 14.2 14.2 12.8* 12.5*  HCT 46.6 44.5 40.9 39.3  MCV 103.1* 99.1 100.7* 98.7  PLT 290 260 310  423   Basic Metabolic Panel: Recent Labs  Lab 02/22/19 1900 02/23/19 0241 02/25/19 0740 02/26/19 0238  NA 132* 133* 134* 134*  K 5.5* 5.0 3.8 3.7  CL 96* 99 99 98  CO2 18* 18* 19* 23  GLUCOSE 125* 103* 81 146*  BUN 50* 56* 45* 22  CREATININE 9.64* 9.77* 9.04* 6.21*  CALCIUM 9.7 9.4 9.0 8.9  PHOS  --   --  2.7  --    Liver Function Tests: Recent Labs  Lab 02/22/19 1900 02/25/19 0740  AST 18  --   ALT 16  --   ALKPHOS 58  --   BILITOT 0.6  --   PROT 8.6*  --   ALBUMIN 3.4* 2.8*   CBG: Recent Labs  Lab 02/25/19 1135 02/25/19 1206 02/25/19 1220 02/25/19 1820 02/27/19 1023  GLUCAP 67* 65* 76 62* 136*    Recent Results (from the past 240 hour(s))  Blood culture (routine x 2)     Status: None   Collection Time:  02/22/19  7:45 PM   Specimen: BLOOD  Result Value Ref Range Status   Specimen Description BLOOD BLOOD LEFT WRIST  Final   Special Requests   Final    BOTTLES DRAWN AEROBIC AND ANAEROBIC Blood Culture adequate volume   Culture   Final    NO GROWTH 5 DAYS Performed at [facility], [place] [address], [place], [place] [address]    Report Status 02/27/2019 FINAL  Final  Blood culture (routine x 2)     Status: None   Collection Time: 02/22/19  8:20 PM   Specimen: BLOOD RIGHT HAND  Result Value Ref Range Status   Specimen Description BLOOD RIGHT HAND  Final   Special Requests   Final    BOTTLES DRAWN AEROBIC AND ANAEROBIC Blood Culture results may not be optimal due to an inadequate volume of blood received in culture bottles   Culture   Final    NO GROWTH 5 DAYS Performed at [facility], [place] [address], [place], [place] [address]    Report Status 02/27/2019 FINAL  Final  SARS Coronavirus 2 (CEPHEID - Performed in [facility] hospital lab), Hosp Order     Status: None   Collection Time: 02/22/19  9:29 PM   Specimen: Nasopharyngeal Swab  Result Value Ref Range Status   SARS Coronavirus 2 NEGATIVE NEGATIVE Final    Comment: (NOTE) If result is NEGATIVE SARS-CoV-2 target nucleic acids are NOT DETECTED. The SARS-CoV-2 RNA is generally detectable in upper and lower  respiratory specimens during the acute phase of infection. The lowest  concentration of SARS-CoV-2 viral copies this assay can detect is 250  copies / mL. A negative result does not preclude SARS-CoV-2 infection  and should not be used as the sole basis for treatment or other  patient management decisions.  A negative result may occur with  improper specimen collection / handling, submission of specimen other  than nasopharyngeal swab, presence of viral mutation(s) within the  areas targeted by this assay, and inadequate number of viral copies  (<250 copies / mL). A negative result must be combined with clinical   observations, patient history, and epidemiological information. If result is POSITIVE SARS-CoV-2 target nucleic acids are DETECTED. The SARS-CoV-2 RNA is generally detectable in upper and lower  respiratory specimens dur ing the acute phase of infection.  Positive  results are indicative of active infection with SARS-CoV-2.  Clinical  correlation with patient history and other diagnostic  information is  necessary to determine patient infection status.  Positive results do  not rule out bacterial infection or co-infection with other viruses. If result is PRESUMPTIVE POSTIVE SARS-CoV-2 nucleic acids MAY BE PRESENT.   A presumptive positive result was obtained on the submitted specimen  and confirmed on repeat testing.  While 2019 novel coronavirus  (SARS-CoV-2) nucleic acids may be present in the submitted sample  additional confirmatory testing may be necessary for epidemiological  and / or clinical management purposes  to differentiate between  SARS-CoV-2 and other Sarbecovirus currently known to infect humans.  If clinically indicated additional testing with an alternate test  methodology [phone]) is advised. The SARS-CoV-2 RNA is generally  detectable in upper and lower respiratory sp ecimens during the acute  phase of infection. The expected result is Negative. Fact Sheet for Patients:  StrictlyIdeas.no Fact Sheet for Healthcare Providers: BankingDealers.co.za This test is not yet approved or cleared by the [place] FDA and has been authorized for detection and/or diagnosis of SARS-CoV-2 by FDA under an Emergency Use Authorization (EUA).  This EUA will remain in effect (meaning this test can be used) for the duration of the COVID-19 declaration under Section 564(b)(1) of the Act, 21 U.S.C. section 360bbb-3(b)(1), unless the authorization is terminated or revoked sooner. Performed at [facility], [place] [address]  [place], [place] [address]   MRSA PCR Screening     Status: None   Collection Time: 02/23/19  2:16 AM   Specimen: Nasal Mucosa; Nasopharyngeal  Result Value Ref Range Status   MRSA by PCR NEGATIVE NEGATIVE Final    Comment:        The GeneXpert MRSA Assay (FDA approved for NASAL specimens only), is one component of a comprehensive MRSA colonization surveillance program. It is not intended to diagnose MRSA infection nor to guide or monitor treatment for MRSA infections. Performed at [facility], [place] [address], [place], [place] [address]          Radiology Studies: No results found.      Scheduled Meds:  aspirin EC  81 mg Oral Daily   calcitRIOL  0.5 mcg Oral Q M,W,F-HD   Chlorhexidine Gluconate Cloth  6 each Topical Q0600   Chlorhexidine Gluconate Cloth  6 each Topical Q0600   Chlorhexidine Gluconate Cloth  6 each Topical Q0600   cinacalcet  30 mg Oral Q M,W,F-HD   clopidogrel  75 mg Oral Q breakfast   collagenase  1 application Topical Daily   ferric citrate  420 mg Oral TID WC   gabapentin  100 mg Oral QHS   midodrine  10 mg Oral Q M,W,F-HD   multivitamin  1 tablet Oral QHS   pantoprazole  40 mg Oral Q0600   sodium chloride flush  3 mL Intravenous Q12H   vancomycin variable dose per unstable renal function (pharmacist dosing)   Does not apply See admin instructions   Continuous Infusions:  sodium chloride     piperacillin-tazobactam (ZOSYN)  IV 3.375 g (02/27/19 0207)   vancomycin Stopped (02/23/19 1857)     LOS: 5 days     Vernell Leep, MD, FACP, [facility]. [facility]  To contact the attending provider between 7A-7P or the covering provider during after hours 7P-7A, please log into the web site [email or web address] and access using universal [place] password for that web site. If you do not have the password, please call the hospital operator.  02/27/2019, 10:43 AM

## 2019-02-27 NOTE — Progress Notes (Signed)
Please keep NPO after midnight.  Plan right peroneal intervention for non-healing wound with Dr. Donzetta Matters tomorrow.  Continue aspirin and plavix.  [name], MD [facility] [place] Office: [phone] Pager: [place]

## 2019-02-27 NOTE — Progress Notes (Signed)
Subjective:  No new issues-   Objective Vital signs in last 24 hours: Vitals:   02/26/19 1034 02/26/19 1443 02/26/19 1900 02/26/19 2000  BP:  (!) 84/62 (!) 113/44 (!) 113/44  Pulse: 77 90 92 (!) 192  Resp:   12   Temp: 98 F (36.7 C) 98.2 F (36.8 C) 98.2 F (36.8 C)   TempSrc: Oral Oral Oral   SpO2: 100% 95% 99% (!) 82%  Weight:      Height:       Weight change:   Intake/Output Summary (Last 24 hours) at 02/27/2019 4081 Last data filed at 02/26/2019 2000 Gross per 24 hour  Intake 1270 ml  Output -  Net 1270 ml    Dialysis Orders:  MWF - [facility]  4hrs, BFR 400, DFR 800,  EDW 79kg, 3K/ 2.5Ca  Access: R IJ TDC  Heparin 5000, 2000 intermittent Calcitriol 0.5 mcg PO qHD   Assessment/Plan: 1.  Cellulitis w/critical limb ischemia w/gangreous toes of L foot: VVS consulted,  Angiogram with angioplasty on left on 7/17.  vanc and zosyn started. Per Dr. Oneida Alar- will need intervention on right leg on Monday.  Mention of possibly needing toe amputations as well ?  2.  ESRD -  On HD MWF. K5.0.  Next HD planned for Monday- will need to coordinate with procedure.  Will do first shift tentatively tomorrow- VVS please let me know if you would like something different  3.  Hypertension/volume  - BP well controlled. On midodrine pre HD. Volume status stable, close to EDW.   4.  Anemia of CKD - Hgb 14.2--> 12. No indication for ESA at this time.  5.  Secondary Hyperparathyroidism -  Ca in goal.   phos low. Continue calcitriol and binders - auryxia 3AC TID- -  dec to 2 TID. Also on sensipar 6.  Nutrition - Renal diet with fluid restrictions, vit 7. DMT2 8. Hx PVD s/p stent placement in Dec 2019 for RLE - poorly healing wound -  Needing intervention on Monday    9. Chronic systolic HD s/p ICD placement    Timothy Faulkner    Labs: Basic Metabolic Panel: Recent Labs  Lab 02/23/19 0241 02/25/19 0740 02/26/19 0238  NA 133* 134* 134*  K 5.0 3.8 3.7  CL 99 99 98  CO2 18* 19* 23   GLUCOSE 103* 81 146*  BUN 56* 45* 22  CREATININE 9.77* 9.04* 6.21*  CALCIUM 9.4 9.0 8.9  PHOS  --  2.7  --    Liver Function Tests: Recent Labs  Lab 02/22/19 1900 02/25/19 0740  AST 18  --   ALT 16  --   ALKPHOS 58  --   BILITOT 0.6  --   PROT 8.6*  --   ALBUMIN 3.4* 2.8*   No results for input(s): LIPASE, AMYLASE in the last 168 hours. No results for input(s): AMMONIA in the last 168 hours. CBC: Recent Labs  Lab 02/22/19 1900 02/23/19 0241 02/25/19 0740 02/26/19 0238  WBC 8.9 8.2 9.1 8.4  NEUTROABS 6.9  --   --   --   HGB 14.2 14.2 12.8* 12.5*  HCT 46.6 44.5 40.9 39.3  MCV 103.1* 99.1 100.7* 98.7  PLT 290 260 310 286   Cardiac Enzymes: No results for input(s): CKTOTAL, CKMB, CKMBINDEX, TROPONINI in the last 168 hours. CBG: Recent Labs  Lab 02/25/19 0614 02/25/19 1135 02/25/19 1206 02/25/19 1220 02/25/19 1820  GLUCAP 71 67* 65* 76 62*    Iron Studies:  No results for input(s): IRON, TIBC, TRANSFERRIN, FERRITIN in the last 72 hours. Studies/Results: No results found. Medications: Infusions: . sodium chloride    . piperacillin-tazobactam (ZOSYN)  IV 3.375 g (02/27/19 0207)  . vancomycin Stopped (02/23/19 1857)    Scheduled Medications: . aspirin EC  81 mg Oral Daily  . calcitRIOL  0.5 mcg Oral Q M,W,F-HD  . Chlorhexidine Gluconate Cloth  6 each Topical Q0600  . Chlorhexidine Gluconate Cloth  6 each Topical Q0600  . cinacalcet  30 mg Oral Q M,W,F-HD  . clopidogrel  75 mg Oral Q breakfast  . collagenase  1 application Topical Daily  . ferric citrate  420 mg Oral TID WC  . gabapentin  100 mg Oral QHS  . midodrine  10 mg Oral Q M,W,F-HD  . multivitamin  1 tablet Oral QHS  . pantoprazole  40 mg Oral Q0600  . sodium chloride flush  3 mL Intravenous Q12H  . vancomycin variable dose per unstable renal function (pharmacist dosing)   Does not apply See admin instructions    have reviewed scheduled and prn medications.  Physical Exam: General:  NAD Heart: RRR Lungs: mostly clear Abdomen: soft, non tender Extremities: no edema-  Both feet bandaged  Dialysis Access: TDC     02/27/2019,7:22 AM  LOS: 5 days

## 2019-02-28 ENCOUNTER — Inpatient Hospital Stay (HOSPITAL_COMMUNITY)
Admission: EM | Disposition: A | Discharge status: Home Health | Payer: Self-pay | Source: Home / Self Care | Attending: Internal Medicine

## 2019-02-28 ENCOUNTER — Encounter (HOSPITAL_COMMUNITY): Payer: Self-pay | Admitting: Vascular Surgery

## 2019-02-28 HISTORY — PX: PERIPHERAL VASCULAR ATHERECTOMY: CATH118256

## 2019-02-28 LAB — CBC
HCT: 36.3 % — ABNORMAL LOW (ref 39.0–52.0)
Hemoglobin: 11.6 g/dL — ABNORMAL LOW (ref 13.0–17.0)
MCH: 31.4 pg (ref 26.0–34.0)
MCHC: 32 g/dL (ref 30.0–36.0)
MCV: 98.4 fL (ref 80.0–100.0)
Platelets: 297 10*3/uL (ref 150–400)
RBC: 3.69 MIL/uL — ABNORMAL LOW (ref 4.22–5.81)
RDW: 15.9 % — ABNORMAL HIGH (ref 11.5–15.5)
WBC: 10.9 10*3/uL — ABNORMAL HIGH (ref 4.0–10.5)
nRBC: 0 % (ref 0.0–0.2)

## 2019-02-28 LAB — RENAL FUNCTION PANEL
Albumin: 2.5 g/dL — ABNORMAL LOW (ref 3.5–5.0)
Anion gap: 14 (ref 5–15)
BUN: 40 mg/dL — ABNORMAL HIGH (ref 8–23)
CO2: 22 mmol/L (ref 22–32)
Calcium: 9.1 mg/dL (ref 8.9–10.3)
Chloride: 96 mmol/L — ABNORMAL LOW (ref 98–111)
Creatinine, Ser: 9.74 mg/dL — ABNORMAL HIGH (ref 0.61–1.24)
GFR calc Af Amer: 6 mL/min — ABNORMAL LOW (ref >60)
GFR calc non Af Amer: 5 mL/min — ABNORMAL LOW (ref >60)
Glucose, Bld: 107 mg/dL — ABNORMAL HIGH (ref 70–99)
Phosphorus: 2.9 mg/dL (ref 2.5–4.6)
Potassium: 3.7 mmol/L (ref 3.5–5.1)
Sodium: 132 mmol/L — ABNORMAL LOW (ref 135–145)

## 2019-02-28 LAB — VANCOMYCIN, RANDOM: Vancomycin Rm: 22

## 2019-02-28 LAB — GLUCOSE, CAPILLARY
Glucose-Capillary: 104 mg/dL — ABNORMAL HIGH (ref 70–99)
Glucose-Capillary: 116 mg/dL — ABNORMAL HIGH (ref 70–99)
Glucose-Capillary: 160 mg/dL — ABNORMAL HIGH (ref 70–99)

## 2019-02-28 SURGERY — PERIPHERAL VASCULAR ATHERECTOMY
Anesthesia: LOCAL | Laterality: Right

## 2019-02-28 MED ORDER — HEPARIN SODIUM (PORCINE) 1000 UNIT/ML IJ SOLN
INTRAMUSCULAR | Status: AC
  Filled 2019-02-28: qty 1

## 2019-02-28 MED ORDER — HEPARIN SODIUM (PORCINE) 1000 UNIT/ML IJ SOLN
INTRAMUSCULAR | Status: DC | PRN
  Administered 2019-02-28: 8000 [IU] via INTRAVENOUS

## 2019-02-28 MED ORDER — VERAPAMIL HCL 2.5 MG/ML IV SOLN
INTRAVENOUS | Status: AC
  Filled 2019-02-28: qty 2

## 2019-02-28 MED ORDER — ALTEPLASE 2 MG IJ SOLR: 2.0000 mg | Freq: Once | INTRAMUSCULAR | Status: DC | PRN

## 2019-02-28 MED ORDER — HEPARIN (PORCINE) IN NACL 1000-0.9 UT/500ML-% IV SOLN
INTRAVENOUS | Status: AC
  Filled 2019-02-28: qty 1000

## 2019-02-28 MED ORDER — SODIUM CHLORIDE 0.9 % IV SOLN: 250.0000 mL | INTRAVENOUS | Status: DC | PRN

## 2019-02-28 MED ORDER — MIDAZOLAM HCL 2 MG/2ML IJ SOLN
INTRAMUSCULAR | Status: DC | PRN
  Administered 2019-02-28: 1 mg via INTRAVENOUS

## 2019-02-28 MED ORDER — MIDAZOLAM HCL 2 MG/2ML IJ SOLN
INTRAMUSCULAR | Status: AC
  Filled 2019-02-28: qty 2

## 2019-02-28 MED ORDER — IODIXANOL 320 MG/ML IV SOLN
INTRAVENOUS | Status: DC | PRN
  Administered 2019-02-28: 20 mL via INTRA_ARTERIAL

## 2019-02-28 MED ORDER — HEPARIN SODIUM (PORCINE) 1000 UNIT/ML DIALYSIS: 1000.0000 [IU] | INTRAMUSCULAR | Status: DC | PRN

## 2019-02-28 MED ORDER — HEPARIN SODIUM (PORCINE) 1000 UNIT/ML DIALYSIS: 5000.0000 [IU] | INTRAMUSCULAR | Status: DC | PRN

## 2019-02-28 MED ORDER — SODIUM CHLORIDE 0.9 % IV SOLN: 100.0000 mL | INTRAVENOUS | Status: DC | PRN

## 2019-02-28 MED ORDER — LABETALOL HCL 5 MG/ML IV SOLN: 10.0000 mg | INTRAVENOUS | Status: DC | PRN

## 2019-02-28 MED ORDER — LIDOCAINE-PRILOCAINE 2.5-2.5 % EX CREA: 1.0000 "application " | TOPICAL_CREAM | CUTANEOUS | Status: DC | PRN

## 2019-02-28 MED ORDER — HYDRALAZINE HCL 20 MG/ML IJ SOLN: 5.0000 mg | INTRAMUSCULAR | Status: DC | PRN

## 2019-02-28 MED ORDER — PENTAFLUOROPROP-TETRAFLUOROETH EX AERO: 1.0000 "application " | INHALATION_SPRAY | CUTANEOUS | Status: DC | PRN

## 2019-02-28 MED ORDER — SODIUM CHLORIDE 0.9% FLUSH
3.0000 mL | Freq: Two times a day (BID) | INTRAVENOUS | Status: DC
  Administered 2019-02-28 – 2019-03-03 (×7): 3 mL via INTRAVENOUS

## 2019-02-28 MED ORDER — LIDOCAINE HCL (PF) 1 % IJ SOLN
INTRAMUSCULAR | Status: AC
  Filled 2019-02-28: qty 30

## 2019-02-28 MED ORDER — NITROGLYCERIN IN D5W 200-5 MCG/ML-% IV SOLN
INTRAVENOUS | Status: AC
  Filled 2019-02-28: qty 250

## 2019-02-28 MED ORDER — ROSUVASTATIN CALCIUM 10 MG PO TABS
10.0000 mg | ORAL_TABLET | Freq: Every day | ORAL | Status: DC
  Filled 2019-02-28: qty 1

## 2019-02-28 MED ORDER — FENTANYL CITRATE (PF) 100 MCG/2ML IJ SOLN
INTRAMUSCULAR | Status: DC | PRN
  Administered 2019-02-28: 25 ug via INTRAVENOUS

## 2019-02-28 MED ORDER — ROSUVASTATIN CALCIUM 5 MG PO TABS
10.0000 mg | ORAL_TABLET | Freq: Every day | ORAL | Status: DC
  Administered 2019-02-28 – 2019-03-02 (×3): 10 mg via ORAL
  Filled 2019-02-28 (×3): qty 2

## 2019-02-28 MED ORDER — NEPRO/CARBSTEADY PO LIQD
237.0000 mL | Freq: Two times a day (BID) | ORAL | Status: DC
  Administered 2019-03-01: 237 mL via ORAL

## 2019-02-28 MED ORDER — LIDOCAINE HCL (PF) 1 % IJ SOLN: 5.0000 mL | INTRAMUSCULAR | Status: DC | PRN

## 2019-02-28 MED ORDER — FENTANYL CITRATE (PF) 100 MCG/2ML IJ SOLN
INTRAMUSCULAR | Status: AC
  Filled 2019-02-28: qty 2

## 2019-02-28 MED ORDER — PRO-STAT SUGAR FREE PO LIQD
30.0000 mL | Freq: Two times a day (BID) | ORAL | Status: DC
  Administered 2019-02-28 – 2019-03-01 (×3): 30 mL via ORAL
  Filled 2019-02-28 (×5): qty 30

## 2019-02-28 MED ORDER — ACETAMINOPHEN 325 MG PO TABS
650.0000 mg | ORAL_TABLET | ORAL | Status: DC | PRN
  Administered 2019-02-28: 650 mg via ORAL

## 2019-02-28 MED ORDER — HEPARIN SODIUM (PORCINE) 1000 UNIT/ML IJ SOLN
INTRAMUSCULAR | Status: AC
  Filled 2019-02-28: qty 4

## 2019-02-28 MED ORDER — SODIUM CHLORIDE 0.9% FLUSH: 3.0000 mL | INTRAVENOUS | Status: DC | PRN

## 2019-02-28 MED ORDER — LIDOCAINE HCL (PF) 1 % IJ SOLN
INTRAMUSCULAR | Status: DC | PRN
  Administered 2019-02-28: 15 mL

## 2019-02-28 MED ORDER — ONDANSETRON HCL 4 MG/2ML IJ SOLN: 4.0000 mg | Freq: Four times a day (QID) | INTRAMUSCULAR | Status: DC | PRN

## 2019-02-28 MED FILL — Lidocaine HCl Local Preservative Free (PF) Inj 1%: INTRAMUSCULAR | Qty: 30 | Status: AC

## 2019-02-28 SURGICAL SUPPLY — 23 items
BALLN COYOTE OTW 3X100X150 (BALLOONS) ×2
BALLOON COYOTE OTW 3X100X150 (BALLOONS) IMPLANT
CATH CROSS OVER TEMPO 5F (CATHETERS) ×1 IMPLANT
CATH CXI SUPP 2.6F 150 ANG (CATHETERS) ×1 IMPLANT
CLOSURE MYNX CONTROL 6F/7F (Vascular Products) ×1 IMPLANT
CROWN STEALTH MICRO-30 1.25MM (CATHETERS) ×1 IMPLANT
DEVICE EMBOSHIELD NAV6 2.5-4.8 (FILTER) ×1 IMPLANT
KIT ENCORE 26 ADVANTAGE (KITS) ×1 IMPLANT
KIT MICROPUNCTURE NIT STIFF (SHEATH) ×1 IMPLANT
KIT PV (KITS) ×2 IMPLANT
PROTECTION STATION PRESSURIZED (MISCELLANEOUS) ×2
SHEATH PINNACLE 5F 10CM (SHEATH) ×1 IMPLANT
SHEATH PINNACLE 6F 10CM (SHEATH) ×1 IMPLANT
SHEATH PINNACLE ST 6F 65CM (SHEATH) ×1 IMPLANT
SHEATH PROBE COVER 6X72 (BAG) ×1 IMPLANT
SHIELD RADPAD SCOOP 12X17 (MISCELLANEOUS) ×1 IMPLANT
STATION PROTECTION PRESSURIZED (MISCELLANEOUS) IMPLANT
SYR MEDRAD MARK V 150ML (SYRINGE) ×1 IMPLANT
TRANSDUCER W/STOPCOCK (MISCELLANEOUS) ×2 IMPLANT
TRAY PV CATH (CUSTOM PROCEDURE TRAY) ×2 IMPLANT
WIRE BENTSON .035X145CM (WIRE) ×1 IMPLANT
WIRE G V18X300CM (WIRE) ×1 IMPLANT
WIRE VIPER ADVANCE .017X335CM (WIRE) ×1 IMPLANT

## 2019-02-28 NOTE — Progress Notes (Signed)
Patient lost IV access while Zosyn was running, Zosyn stopped, IV removed. IV team started new IV and Zosyn resumed.

## 2019-02-28 NOTE — Progress Notes (Signed)
  Progress Note    02/28/2019 10:48 AM 3 Days Post-Op  Subjective: Just finished dialysis, not having any acute issues  Vitals:   02/28/19 0930 02/28/19 0949  BP: (!) 147/108 (!) 142/87  Pulse: 79 62  Resp: 16 16  Temp:  97.9 F (36.6 C)  SpO2:  96%    Physical Exam: Awake alert oriented Alert respirations Bilateral common femoral pulses are palpable Dressings to bilateral feet are clean dry intact  CBC    Component Value Date/Time   WBC 10.9 (H) 02/28/2019 0641   RBC 3.69 (L) 02/28/2019 0641   HGB 11.6 (L) 02/28/2019 0641   HGB 15.2 05/11/2018 1219   HCT 36.3 (L) 02/28/2019 0641   PLT 297 02/28/2019 0641   PLT 245 05/11/2018 1219   MCV 98.4 02/28/2019 0641   MCH 31.4 02/28/2019 0641   MCHC 32.0 02/28/2019 0641   RDW 15.9 (H) 02/28/2019 0641   LYMPHSABS 0.9 02/22/2019 1900   MONOABS 1.1 (H) 02/22/2019 1900   EOSABS 0.0 02/22/2019 1900   BASOSABS 0.0 02/22/2019 1900    BMET    Component Value Date/Time   NA 132 (L) 02/28/2019 0641   K 3.7 02/28/2019 0641   CL 96 (L) 02/28/2019 0641   CO2 22 02/28/2019 0641   GLUCOSE 107 (H) 02/28/2019 0641   BUN 40 (H) 02/28/2019 0641   CREATININE 9.74 (H) 02/28/2019 0641   CREATININE 9.49 (HH) 05/11/2018 1219   CALCIUM 9.1 02/28/2019 0641   GFRNONAA 5 (L) 02/28/2019 0641   GFRNONAA 5 (L) 05/11/2018 1219   GFRAA 6 (L) 02/28/2019 0641   GFRAA 6 (L) 05/11/2018 1219    INR    Component Value Date/Time   INR 1.24 03/12/2013 0553     Intake/Output Summary (Last 24 hours) at 02/28/2019 1048 Last data filed at 02/28/2019 0949 Gross per 24 hour  Intake 458 ml  Output 2090 ml  Net -1632 ml     Assessment/plan:  73 y.o. male is s/p revascularization left TP trunk and posterior tibial artery with gangrenous changes to 2 of his toes.  Also has a nonhealing wound on the right side with in-stent stenosis of his previous peroneal artery stent.  Plan for angiogram today of the right lower extremity to treat the in-stent  stenosis.  Agree with [name] would be good time to amputate the other 2 toes and he is planning this for later this week.  Odai Wimmer C. Donzetta Matters, MD [facility] [place] Office: [phone] Pager: [phone]  02/28/2019 10:48 AM

## 2019-02-28 NOTE — Progress Notes (Signed)
PT Cancellation Note  Patient Details Name: Timothy Faulkner MRN: [id number] DOB: 1945/11/11   Cancelled Treatment:    Reason Eval/Treat Not Completed: Other (comment)(Pt on bedrest after procedure. Will check back tomorrow. )   [name] 02/28/2019, 2:26 PM Hulet Ehrmann,PT Acute Rehabilitation Services Pager:  [phone]  Office:  [phone]

## 2019-02-28 NOTE — Progress Notes (Signed)
PT Cancellation Note  Patient Details Name: Timothy Faulkner MRN: [id number] DOB: 05/13/1946   Cancelled Treatment:    Reason Eval/Treat Not Completed: Patient at procedure or test/unavailable(Pt in angiogram. )   [name] 02/28/2019, 11:31 AM [name] Acute Rehabilitation Services Pager:  [phone]  Office:  [phone]

## 2019-02-28 NOTE — Progress Notes (Signed)
Subjective:  No new issues-   Objective Vital signs in last 24 hours: Vitals:   02/28/19 1204 02/28/19 1209 02/28/19 1214 02/28/19 1224  BP:      Pulse: (!) 102 (!) 102 (!) 0 (!) 102  Resp: 12 12 (!) 0   Temp:    98.1 F (36.7 C)  TempSrc:    Oral  SpO2: 92% 96% (!) 0%   Weight:      Height:       Weight change:   Intake/Output Summary (Last 24 hours) at 02/28/2019 1234 Last data filed at 02/28/2019 0949 Gross per 24 hour  Intake 458 ml  Output 2090 ml  Net -1632 ml    Dialysis Orders:  MWF [facility]  4h  400/800  79kg  3K/2.5 bath  RIJ TDC   Hep 5000 +2000prn Calcitriol 0.5 mcg PO qHD   Assessment/Plan: 1.  Cellulitis w/critical limb ischemia: w/gangreous toes of L foot: VVS consulted,  LLE Angiogram with angioplasty on 7/17.  vanc and zosyn started. Per Dr. Oneida Alar- will need intervention on right leg today 2.  ESRD - on HD MWF.  HD today this am.  3.  Hypertension/volume  - BP well controlled. On midodrine pre HD. Volume status stable, close to EDW.   4.  Anemia of CKD - Hgb 14.2--> 12. No indication for ESA at this time.  5.  Secondary Hyperparathyroidism -  Ca in goal.   phos low. Continue calcitriol and binders - auryxia 3AC TID- -  dec to 2 TID. Also on sensipar 6.  Nutrition - Renal diet with fluid restrictions, vit 7. DMT2 8. Hx PVD s/p stent placement in Dec 2019 for RLE - poorly healing wound -  Needing intervention on Monday    9. Chronic systolic HD s/p ICD placement    [name], MD 02/28/2019, 12:35 PM        Labs: Basic Metabolic Panel: Recent Labs  Lab 02/25/19 0740 02/26/19 0238 02/28/19 0641  NA 134* 134* 132*  K 3.8 3.7 3.7  CL 99 98 96*  CO2 19* 23 22  GLUCOSE 81 146* 107*  BUN 45* 22 40*  CREATININE 9.04* 6.21* 9.74*  CALCIUM 9.0 8.9 9.1  PHOS 2.7  --  2.9   Liver Function Tests: Recent Labs  Lab 02/22/19 1900 02/25/19 0740 02/28/19 0641  AST 18  --   --   ALT 16  --   --   ALKPHOS 58  --   --   BILITOT 0.6  --   --    PROT 8.6*  --   --   ALBUMIN 3.4* 2.8* 2.5*   No results for input(s): LIPASE, AMYLASE in the last 168 hours. No results for input(s): AMMONIA in the last 168 hours. CBC: Recent Labs  Lab 02/22/19 1900 02/23/19 0241 02/25/19 0740 02/26/19 0238 02/28/19 0641  WBC 8.9 8.2 9.1 8.4 10.9*  NEUTROABS 6.9  --   --   --   --   HGB 14.2 14.2 12.8* 12.5* 11.6*  HCT 46.6 44.5 40.9 39.3 36.3*  MCV 103.1* 99.1 100.7* 98.7 98.4  PLT 290 260 310 286 297   Cardiac Enzymes: No results for input(s): CKTOTAL, CKMB, CKMBINDEX, TROPONINI in the last 168 hours. CBG: Recent Labs  Lab 02/25/19 1820 02/27/19 1023 02/27/19 1556 02/28/19 0610 02/28/19 0922  GLUCAP 62* 136* 205* 104* 116*    Iron Studies: No results for input(s): IRON, TIBC, TRANSFERRIN, FERRITIN in the last 72 hours. Studies/Results: No results  found. Medications: Infusions: . sodium chloride    . sodium chloride    . piperacillin-tazobactam (ZOSYN)  IV 12.5 mL/hr at 02/28/19 0450  . vancomycin Stopped (02/23/19 1857)    Scheduled Medications: . aspirin EC  81 mg Oral Daily  . calcitRIOL  0.5 mcg Oral Q M,W,F-HD  . Chlorhexidine Gluconate Cloth  6 each Topical Q0600  . Chlorhexidine Gluconate Cloth  6 each Topical Q0600  . Chlorhexidine Gluconate Cloth  6 each Topical Q0600  . cinacalcet  30 mg Oral Q M,W,F-HD  . clopidogrel  75 mg Oral Q breakfast  . collagenase  1 application Topical Daily  . ferric citrate  420 mg Oral TID WC  . gabapentin  100 mg Oral QHS  . heparin      . midodrine  10 mg Oral Q M,W,F-HD  . multivitamin  1 tablet Oral QHS  . pantoprazole  40 mg Oral Q0600  . rosuvastatin  10 mg Oral q1800  . sodium chloride flush  3 mL Intravenous Q12H  . sodium chloride flush  3 mL Intravenous Q12H  . vancomycin variable dose per unstable renal function (pharmacist dosing)   Does not apply See admin instructions    have reviewed scheduled and prn medications.  Physical Exam: General: NAD Heart:  RRR Lungs: mostly clear Abdomen: soft, non tender Extremities: no edema-  Both feet bandaged  Dialysis Access: TDC     02/28/2019,12:34 PM  LOS: 6 days

## 2019-02-28 NOTE — Plan of Care (Signed)

## 2019-02-28 NOTE — Progress Notes (Signed)
Initial Nutrition Assessment  DOCUMENTATION CODES:   Obesity unspecified  INTERVENTION:  -Nepro Shake po daily, each supplement provides 425 kcal and 19 grams protein -Liberalize diet; remove CHO modification on current renal diet.  -Prostat po BID, each supplement provides 100 kcals and 15 grams protien   NUTRITION DIAGNOSIS:   Increased nutrient needs related to wound healing as evidenced by estimated needs.  GOAL:   Patient will meet greater than or equal to 90% of their needs   MONITOR:   PO intake, Labs, I & O's, Weight trends, Supplement acceptance  REASON FOR ASSESSMENT:   Other (Comment)(Wound healing)    ASSESSMENT:  73 year old male with medical history significant for ESRD on HD, Z5GL, chronic systolic heart failure s/p ICD, PVD s/ stent in 2019, history of osteomyelitis, fifth toe amp. Admitted with gangrene to left third and fourth with cellulitis and angioplasty of left tibioperoneal trunk and posterior artery   HD MWF EDW 79kg  7/20: angiogram to rt lower extremity to treat the in-stent stenosis  Per chart review; plans to amputate other 2 toes later this week.  Pt seen after procedure this afternoon; he reports feeling a little tired, but "pretty good for the most part"  Patient reports that he did not eat lunch today - his food was already in the room and cold. Pt endorses good po with most meals.   Patient recalls eating three meals/day on a daily basis. He reports B: grits, 2 eggs, 2 pieces of toast w/ strawberry jelly L: leftovers from the previous nights dinner D: chx, green beans, rice or mashed potatoes.   His wife prepares his meals and he reports she makes sure he eats and takes his medicine. He goes to HD at 5:50am and eats prior to apt. Patient naps for a few hours after returning home and will eat a normal lunch and dinner.   Patient reports EDW of 178 lbs  Medications reviewed and include: Calcitriol, Sensipar, Plavix, Ferric citrate,  Gabapentin, proamatine, rena-vit, protonix, vancomycin  Labs: Na 132 (L) Phos 2.9 Glucose 107   NUTRITION - FOCUSED PHYSICAL EXAM:    Most Recent Value  Orbital Region  Mild depletion  Upper Arm Region  No depletion  Thoracic and Lumbar Region  No depletion  Buccal Region  No depletion  Temple Region  No depletion  Clavicle Bone Region  No depletion  Clavicle and Acromion Bone Region  No depletion  Scapular Bone Region  No depletion  Dorsal Hand  No depletion  Patellar Region  Unable to assess  Anterior Thigh Region  Unable to assess  Posterior Calf Region  Unable to assess  Edema (RD Assessment)  None  Hair  Reviewed  Eyes  Reviewed  Mouth  Reviewed  Skin  Reviewed  Nails  Reviewed       Diet Order:   Diet Order            Diet renal/carb modified with fluid restriction Diet-HS Snack? Nothing; Fluid restriction: 1200 mL Fluid; Room service appropriate? Yes; Fluid consistency: Thin  Diet effective now              EDUCATION NEEDS:   No education needs have been identified at this time  Skin:  Skin Assessment: Reviewed RN Assessment(diabetic ulcer; rt; heel, amp; rt; toe, open wound; left foot; between third and fourth toe)  Last BM:  7/19  Height:   Ht Readings from Last 1 Encounters:  02/22/19 5\' 8"  (1.727 m)  Weight:   Wt Readings from Last 1 Encounters:  02/28/19 82.3 kg    Ideal Body Weight:     BMI:  Body mass index is 27.59 kg/m.  Estimated Nutritional Needs:   Kcal:  2200-2400  Protein:  119-126  Fluid:  1.2L per MD    [name], RD, LDN  After Hours/Weekend Pager: [phone]

## 2019-02-28 NOTE — Progress Notes (Signed)
Called dialysis to make sure that they coordinate times with patients angiogram procedure this morning. Per Freda Munro in dialysis day shift will be on the unit between 6:30-7:00 am to take patient to dialysis.

## 2019-02-28 NOTE — Progress Notes (Signed)
PROGRESS NOTE   Timothy Faulkner  UKG:[id number]    DOB: 1946-01-15    DOA: 02/22/2019  PCP: [name], MD   I have briefly reviewed patients previous medical records in [facility].  Chief Complaint  Patient presents with  . Wound Check    Brief Narrative:  73 year old male, lives with spouse and daughter, ambulates with help of a walker, PMH of ESRD on MWF HD, PAD s/p right peroneal stent and right fifth toe amputation for osteomyelitis, anemia, gout, chronic systolic CHF, AICD, HLD, HTN,?  DM 2, chronic hypotension, presented with several days history of left third and fourth toe darkish discoloration, wound with foul-smelling drainage, worsened in the day or 2 prior to admission, seen by OP podiatry and referred to ED for admission.  Admitted for suspected left third and fourth toe gangrene with cellulitis.  Vascular surgery performed successful angioplasty of left tibioperoneal trunk and posterior tibial artery on 7/17 with plans for repeat arteriogram on 7/20 for right peroneal intervention for nonhealing wound, supposedly at 10:30 AM.  Plans for left toes amputation by podiatry on 03/03/2019.  Nephrology consulted for HD needs.   Assessment & Plan:   Principal Problem:   Cellulitis of left foot Active Problems:   Automatic implantable cardioverter-defibrillator in situ   ESRD (end stage renal disease) on dialysis ([place])   Anemia associated with chronic renal failure   Cardiomyopathy, dilated, nonischemic (HCC)   PAD (peripheral artery disease) (HCC)   Gangrene of toe of left foot (HCC)   Hypotension   PAD with suspected left third and fourth toe gangrene, superficial wound with cellulitis.  X-ray without acute findings.  Empirically started on IV vancomycin and Zosyn (both 7/14 >) per pharmacy which he is getting across HD.  Blood cultures x2: Negative, final report.  Vascular surgery performed successful angioplasty of the left tibioperoneal trunk and posterior  tibial artery on 7/17.  Vascular plans repeat arteriogram to reopen the narrowing at the proximal aspect of his right leg stent on 7/20.  Plan for right peroneal intervention for nonhealing wound 7/20 at 10:30 AM.  Continue aspirin and Plavix.  As per [name], Podiatry follow-up yesterday, plans for toe amputations on 7/23.  S/p right fifth toe amputation, chronic right lateral foot wound  Nonhealing ulcer but clean without clinical evidence of infection.  X-ray without features of osteomyelitis.  Continue wound care and outpatient follow-up.  Vascular surgery and podiatry input appreciated.  Vascular intervention on right lower extremity plan for Monday 7/20  ESRD on MWF HD  Nephrology consulting for HD needs.  Last HD 7/17 and currently undergoing HD 7/20.  Dialyzed via left IJ tunneled HD catheter.  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  Patient follows up with [name], Nephrology.  Patient lost intermittently lost PIV access.  Do not see need for long-term IV access at this time.  Discussed in detail with [name], Nephrology, may use HD catheter for emergency IV needs, avoid PICC line due to history of multiple failed AV fistulas and if IV access absolutely warranted then can consider central line.  Currently has a PIV.  Hyperkalemia/AG metabolic acidosis  Secondary to ESRD on HD.  Potassium 5.5 on admission.  Resolved.  Monitor BMP periodically across HD.  Chronic hypotension  Stable on midodrine, continue.    Patient gets midodrine only on dialysis days.   Intermittent hypotension  but mostly asymptomatic.  Monitor.  Chronic systolic CHF/ICD/PPM  Compensated.  Volume management across HD.  No recent TTE in system, can consider doing outpatient.  Follows with [name], patient's PCP/Cardiology.  I updated [name] on 7/15 regarding patient's  admission when he wanted the hospitalist to continue his care.  Diet-controlled DM 2 with renal complications  I1W 43/08/5398: 4.7.  CBGs well controlled on diet alone, discontinued CBG checks.  Had asymptomatic hypoglycemia CBGs in the 60s on 7/17 when he was n.p.o. for procedures.  CBGs ordered but were never done yesterday.  Discussed with RN today, 136 this morning.  More importantly may need to monitor CBGs while he is n.p.o.  Removed diabetic diet restrictions.  No hypoglycemic range CBGs yesterday.  FBS 104 today.   DVT prophylaxis: SCD Code Status: Full Family Communication: I called and discussed with patient's daughter via phone on 7/18, updated care and answered questions.  She was appreciative of the phone call and updates. Disposition: To be determined pending clinical improvement,?  7/24 pending left third and fourth toe amputation on 7/23 by podiatry.   Consultants:  Vascular surgery Nephrology  Procedures:  HD on 7/17, 7/20 Angioplasty of the left tibioperoneal trunk and posterior tibial artery on 7/17.  Antimicrobials:  IV vancomycin and Zosyn 7/14 >   Subjective: Patient seen this morning at HD.  Denies complaints.  No pain in lower extremities.  States that his arteriogram is at 1030 this morning.  Objective:  Vitals:   02/28/19 0635 02/28/19 0700 02/28/19 0730 02/28/19 0800  BP: (!) 135/54 113/84 (!) 121/42 (!) 84/46  Pulse: 95 66 (!) 108 (!) 107  Resp:      Temp:      TempSrc:      SpO2:      Weight:      Height:        Examination:  General exam: Pleasant elderly male, moderately built and nourished lying comfortably supine in bed undergoing HD via left anterior chest HD catheter. Respiratory system: Clear to auscultation.  No increased work of breathing.  Left IJ tunneled HD catheter without acute findings.  Stable without change. Cardiovascular system: S1 and S2 heard, RRR.  No JVD, murmurs or pedal edema.  Not on telemetry at this time.  Gastrointestinal system: Abdomen is nondistended, soft and nontender.  No organomegaly or masses appreciated.  Normal bowel sounds heard.  Stable without change. Central nervous system: Alert and oriented. No focal neurological deficits. Extremities: Symmetric 5 x 5 power. Skin: As examined on 7/15: Left third and fourth toe with black discoloration, superficial wound on the medial aspect of the left fourth toe. No bleeding.  Unable to palpate left dorsalis pedis or posterior tibial pulses.  Right foot s/p fifth toe amputation with healed site.  Right lateral forefoot with large deep but clean wound without acute findings of infection.  Please see pictures below from admission.  Dressing on both feet clean and dry.  Palpable bilateral popliteal pulses.  No change in lower extremity exam findings.  Left PT brisk by Doppler as per VVS. Psychiatry: Judgement and insight appear normal. Mood & affect appropriate.         Data Reviewed: I have personally reviewed following labs and imaging studies  CBC: Recent Labs  Lab 02/22/19 1900 02/23/19 0241 02/25/19 0740 02/26/19 0238 02/28/19 0641  WBC 8.9 8.2 9.1 8.4 10.9*  NEUTROABS 6.9  --   --   --   --   HGB 14.2  14.2 12.8* 12.5* 11.6*  HCT 46.6 44.5 40.9 39.3 36.3*  MCV 103.1* 99.1 100.7* 98.7 98.4  PLT 290 260 310 286 741   Basic Metabolic Panel: Recent Labs  Lab 02/22/19 1900 02/23/19 0241 02/25/19 0740 02/26/19 0238 02/28/19 0641  NA 132* 133* 134* 134* 132*  K 5.5* 5.0 3.8 3.7 3.7  CL 96* 99 99 98 96*  CO2 18* 18* 19* 23 22  GLUCOSE 125* 103* 81 146* 107*  BUN 50* 56* 45* 22 40*  CREATININE 9.64* 9.77* 9.04* 6.21* 9.74*  CALCIUM 9.7 9.4 9.0 8.9 9.1  PHOS  --   --  2.7  --  2.9   Liver Function Tests: Recent Labs  Lab 02/22/19 1900 02/25/19 0740 02/28/19 0641  AST 18  --   --   ALT 16  --   --   ALKPHOS 58  --   --   BILITOT 0.6  --   --   PROT 8.6*  --   --   ALBUMIN 3.4* 2.8* 2.5*   CBG: Recent Labs  Lab  02/25/19 1220 02/25/19 1820 02/27/19 1023 02/27/19 1556 02/28/19 0610  GLUCAP 76 62* 136* 205* 104*    Recent Results (from the past 240 hour(s))  Blood culture (routine x 2)     Status: None   Collection Time: 02/22/19  7:45 PM   Specimen: BLOOD  Result Value Ref Range Status   Specimen Description BLOOD BLOOD LEFT WRIST  Final   Special Requests   Final    BOTTLES DRAWN AEROBIC AND ANAEROBIC Blood Culture adequate volume   Culture   Final    NO GROWTH 5 DAYS Performed at [facility], [place] [address], [place], [place] [address]    Report Status 02/27/2019 FINAL  Final  Blood culture (routine x 2)     Status: None   Collection Time: 02/22/19  8:20 PM   Specimen: BLOOD RIGHT HAND  Result Value Ref Range Status   Specimen Description BLOOD RIGHT HAND  Final   Special Requests   Final    BOTTLES DRAWN AEROBIC AND ANAEROBIC Blood Culture results may not be optimal due to an inadequate volume of blood received in culture bottles   Culture   Final    NO GROWTH 5 DAYS Performed at [facility], [place] [address] [place], [place] [address]    Report Status 02/27/2019 FINAL  Final  SARS Coronavirus 2 (CEPHEID - Performed in [place] hospital lab), Hosp Order     Status: None   Collection Time: 02/22/19  9:29 PM   Specimen: Nasopharyngeal Swab  Result Value Ref Range Status   SARS Coronavirus 2 NEGATIVE NEGATIVE Final    Comment: (NOTE) If result is NEGATIVE SARS-CoV-2 target nucleic acids are NOT DETECTED. The SARS-CoV-2 RNA is generally detectable in upper and lower  respiratory specimens during the acute phase of infection. The lowest  concentration of SARS-CoV-2 viral copies this assay can detect is 250  copies / mL. A negative result does not preclude SARS-CoV-2 infection  and should not be used as the sole basis for treatment or other  patient management decisions.  A negative result may occur with  improper specimen collection / handling, submission of  specimen other  than nasopharyngeal swab, presence of viral mutation(s) within the  areas targeted by this assay, and inadequate number of viral copies  (<250 copies / mL). A negative result must be combined with clinical  observations, patient history, and  epidemiological information. If result is POSITIVE SARS-CoV-2 target nucleic acids are DETECTED. The SARS-CoV-2 RNA is generally detectable in upper and lower  respiratory specimens dur ing the acute phase of infection.  Positive  results are indicative of active infection with SARS-CoV-2.  Clinical  correlation with patient history and other diagnostic information is  necessary to determine patient infection status.  Positive results do  not rule out bacterial infection or co-infection with other viruses. If result is PRESUMPTIVE POSTIVE SARS-CoV-2 nucleic acids MAY BE PRESENT.   A presumptive positive result was obtained on the submitted specimen  and confirmed on repeat testing.  While 2019 novel coronavirus  (SARS-CoV-2) nucleic acids may be present in the submitted sample  additional confirmatory testing may be necessary for epidemiological  and / or clinical management purposes  to differentiate between  SARS-CoV-2 and other Sarbecovirus currently known to infect humans.  If clinically indicated additional testing with an alternate test  methodology [phone]) is advised. The SARS-CoV-2 RNA is generally  detectable in upper and lower respiratory sp ecimens during the acute  phase of infection. The expected result is Negative. Fact Sheet for Patients:  StrictlyIdeas.no Fact Sheet for Healthcare Providers: BankingDealers.co.za This test is not yet approved or cleared by the [place] FDA and has been authorized for detection and/or diagnosis of SARS-CoV-2 by FDA under an Emergency Use Authorization (EUA).  This EUA will remain in effect (meaning this test can be used) for the  duration of the COVID-19 declaration under Section 564(b)(1) of the Act, 21 U.S.C. section 360bbb-3(b)(1), unless the authorization is terminated or revoked sooner. Performed at [facility], [place] [address], [place], [place] [address]   MRSA PCR Screening     Status: None   Collection Time: 02/23/19  2:16 AM   Specimen: Nasal Mucosa; Nasopharyngeal  Result Value Ref Range Status   MRSA by PCR NEGATIVE NEGATIVE Final    Comment:        The GeneXpert MRSA Assay (FDA approved for NASAL specimens only), is one component of a comprehensive MRSA colonization surveillance program. It is not intended to diagnose MRSA infection nor to guide or monitor treatment for MRSA infections. Performed at [facility], [place] [address], [place], [place] [address]          Radiology Studies: No results found.      Scheduled Meds: . aspirin EC  81 mg Oral Daily  . calcitRIOL  0.5 mcg Oral Q M,W,F-HD  . Chlorhexidine Gluconate Cloth  6 each Topical Q0600  . Chlorhexidine Gluconate Cloth  6 each Topical Q0600  . Chlorhexidine Gluconate Cloth  6 each Topical Q0600  . cinacalcet  30 mg Oral Q M,W,F-HD  . clopidogrel  75 mg Oral Q breakfast  . collagenase  1 application Topical Daily  . ferric citrate  420 mg Oral TID WC  . gabapentin  100 mg Oral QHS  . midodrine  10 mg Oral Q M,W,F-HD  . multivitamin  1 tablet Oral QHS  . pantoprazole  40 mg Oral Q0600  . sodium chloride flush  3 mL Intravenous Q12H  . vancomycin variable dose per unstable renal function (pharmacist dosing)   Does not apply See admin instructions   Continuous Infusions: . sodium chloride    . sodium chloride    . sodium chloride    . piperacillin-tazobactam (ZOSYN)  IV 12.5 mL/hr at 02/28/19 0450  . vancomycin Stopped (02/23/19 1857)     LOS: 6 days  Vernell Leep, MD, FACP, [facility]. [facility]  To contact the attending provider between 7A-7P or the covering provider during after hours  7P-7A, please log into the web site [email or web address] and access using universal [place] password for that web site. If you do not have the password, please call the hospital operator.  02/28/2019, 9:07 AM

## 2019-02-28 NOTE — Op Note (Signed)
    Patient name: Timothy Faulkner MRN: [id number] DOB: March 29, 1946 Sex: male  02/28/2019 Pre-operative Diagnosis: Critical right lower extremity ischemia Post-operative diagnosis:  Same Surgeon:  Erlene Quan C. Donzetta Matters, MD Procedure Performed: 1.  Ultrasound-guided cannulation left common femoral artery 2.  Right lower extremity angiogram 3.  CSI atherectomy with 1.25 micro device of right tibioperoneal trunk with filter embolic protection device 4.  Balloon angioplasty right tibioperoneal trunk and posterior tibial artery with 3 mm balloon 5.  Mynx device closure left common femoral artery 6.  Moderate sedation with fentanyl and Versed for 44 minutes  Indications: 73 year old male with history of peritoneal access on the right and stenting.  He now has known tight stenosis versus occlusion there and is indicated for angiogram and intervention.  Findings: There is a least 95% stenosis of the TP trunk proximal to the stents.  After atherectomy and balloon angioplasty this is reduced to less than 20%.  There is runoff via the peroneal artery to the ankle.   Procedure:  The patient was identified in the holding area and taken to room 8.  The patient was then placed supine on the table and prepped and draped in the usual sterile fashion.  A time out was called.  Ultrasound was used to evaluate the left common femoral arteries is noted be patent.  There is anesthetized 1% lidocaine.  Moderate sedation was administered vitals were monitored throughout the case.  Ultrasound was used to cannulate the left common femoral artery with direct visualization images saved the permanent record.  Micropuncture sheath was placed followed by wire 5 French sheath.  We crossed bifurcation with crossover catheter.  Long 6 French sheath was placed into the right SFA patient was fully heparinized.  Angiogram was performed.  We crossed the tight stenosis with V 18 wire confirmed intraluminal access.  An 014 wire was placed with an  017 tip.  A 2-5 Nav 6 filter was placed.  Atherectomy was performed the TP trunk down to the proximal posterior tibial artery with 1.25 micro CSI device.  Balloon angioplasty was performed.  Balloon angioplasty and completion angiogram  was performed with above findings.  Satisfied we exchanged for short 6 [place] sheath.  Minx device was deployed.  He tolerated procedure without immediate complication.  Contrast: 20cc  Shanena Pellegrino C. Donzetta Matters, MD [facility] Office: [phone] Pager: [phone]

## 2019-02-28 NOTE — Progress Notes (Signed)
PT Cancellation Note  Patient Details Name: Timothy Faulkner MRN: [id number] DOB: [birth year]   Cancelled Treatment:    Reason Eval/Treat Not Completed: Patient at procedure or test/unavailable(Pt in HD.  Will check back as able. )   [name] 02/28/2019, 8:21 AM [name],PT Acute Rehabilitation Services Pager:  [phone]  Office:  [phone]

## 2019-02-28 NOTE — Progress Notes (Addendum)
Pharmacy Antibiotic Note  Timothy Faulkner is a 73 y.o. male admitted on 02/22/2019 with osteomyelitis. Pt is on HD M/W/F (per previous notes).   Continues on Vancomycin/Zosyn pending surgical intervention Afebrile, WBC WNL Vancomycin random level this AM = 22  Plan: Continue Vancomycin 750 mg iv Q HD Continue Zosyn 3.375 grams iv Q 12 hours Follow HD schedule  ? Hold Plavix?  Height: 5\' 8"  (172.7 cm) Weight: 181 lb 7 oz (82.3 kg) IBW/kg (Calculated) : 68.4  Temp (24hrs), Avg:98.3 F (36.8 C), Min:97.9 F (36.6 C), Max:99 F (37.2 C)  Recent Labs  Lab 02/22/19 1900 02/22/19 2139 02/23/19 0241 02/25/19 0740 02/26/19 0238 02/28/19 0211 02/28/19 0641  WBC 8.9  --  8.2 9.1 8.4  --  10.9*  CREATININE 9.64*  --  9.77* 9.04* 6.21*  --  9.74*  LATICACIDVEN 3.3* 0.6  --   --   --   --   --   VANCORANDOM  --   --   --   --   --  22  --     Estimated Creatinine Clearance: 7.2 mL/min (A) (by C-G formula based on SCr of 9.74 mg/dL (H)).    No Known Allergies  Antimicrobials this admission: Vancomycin 7/14 >>   Dose adjustments this admission: n/a  Microbiology results: Culture negative to date  Thank you for allowing pharmacy to be a part of this patient's care. Anette Guarneri, PharmD [phone] 02/28/2019 8:46 AM

## 2019-03-01 LAB — GLUCOSE, CAPILLARY
Glucose-Capillary: 81 mg/dL (ref 70–99)
Glucose-Capillary: 118 mg/dL — ABNORMAL HIGH (ref 70–99)
Glucose-Capillary: 113 mg/dL — ABNORMAL HIGH (ref 70–99)
Glucose-Capillary: 187 mg/dL — ABNORMAL HIGH (ref 70–99)

## 2019-03-01 MED ORDER — CHLORHEXIDINE GLUCONATE CLOTH 2 % EX PADS
6.0000 | MEDICATED_PAD | Freq: Every day | CUTANEOUS | Status: DC
  Administered 2019-03-02 – 2019-03-03 (×2): 6 via TOPICAL

## 2019-03-01 MED FILL — Verapamil HCl IV Soln 2.5 MG/ML: INTRAVENOUS | Qty: 2 | Status: AC

## 2019-03-01 MED FILL — Nitroglycerin IV Soln 100 MCG/ML in D5W: INTRA_ARTERIAL | Qty: 10 | Status: AC

## 2019-03-01 NOTE — Progress Notes (Signed)
PROGRESS NOTE   Timothy Faulkner  ZOX:[id number]    DOB: 1945-11-15    DOA: 02/22/2019  PCP: [name], MD   I have briefly reviewed patients previous medical records in [facility].  Chief Complaint  Patient presents with  . Wound Check    Brief Narrative:  73 year old male, lives with spouse and daughter, ambulates with help of a walker, PMH of ESRD on MWF HD, PAD s/p right peroneal stent and right fifth toe amputation for osteomyelitis, anemia, gout, chronic systolic CHF, AICD, HLD, HTN,?  DM 2, chronic hypotension, presented with several days history of left third and fourth toe darkish discoloration, wound with foul-smelling drainage, worsened in the day or 2 prior to admission, seen by OP podiatry and referred to ED for admission.  Admitted for suspected left third and fourth toe gangrene with cellulitis.  Vascular surgery performed successful angioplasty of left tibioperoneal trunk and posterior tibial artery on 7/17 and right lower extremity tibioperoneal trunk atherectomy on 7/20.  Plans for left third and fourth toes amputation by podiatry on 03/03/2019.  Nephrology consulting for HD needs.   Assessment & Plan:   Principal Problem:   Cellulitis of left foot Active Problems:   Automatic implantable cardioverter-defibrillator in situ   ESRD (end stage renal disease) on dialysis ([place])   Anemia associated with chronic renal failure   Cardiomyopathy, dilated, nonischemic (HCC)   PAD (peripheral artery disease) (HCC)   Gangrene of toe of left foot (HCC)   Hypotension   PAD with critical limb ischemia/left third and fourth toe gangrene, superficial wound with cellulitis.  X-ray without acute findings.  Empirically started on IV vancomycin and Zosyn (both 7/14 >) per pharmacy which he is getting across HD.  Blood cultures x2: Negative, final report.  Consider stopping antibiotics 24 to 48 hours after surgery.  Cellulitis is clinically resolved.  Vascular surgery  performed successful angioplasty of the left tibioperoneal trunk and posterior tibial artery on 7/17.  Vascular surgery performed right lower extremity tibioperoneal trunk atherectomy for nonhealing right foot ulcer on 7/20.  Continue aspirin and Plavix.  Podiatry/[name] plans left third and fourth toe amputations on 7/23 and hopefully can have better healing post vascularization now.  S/p right fifth toe amputation, chronic nonhealing right lateral foot wound  Nonhealing ulcer but clean without clinical evidence of infection.  X-ray without features of osteomyelitis.  Continue wound care and outpatient follow-up.  Vascular surgery and podiatry input appreciated.  S/p RLE TP trunk atherectomy 7/20 and hopefully can have better healing of the right foot wound post revascularization.  ESRD on MWF HD  Nephrology consulting for HD needs.  Last HD 7/17, 7/20, next HD 7/22.  Dialyzed via left IJ tunneled HD catheter.  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  Patient follows up with [name], Nephrology.  Patient lost intermittently lost PIV access.  Do not see need for long-term IV access at this time.  Discussed in detail with [name], Nephrology, may use HD catheter for emergency IV needs, avoid PICC line due to history of multiple failed AV fistulas and if IV access absolutely warranted then can consider central line.  Currently has a PIV.  Hyperkalemia/AG metabolic acidosis  Secondary to ESRD on HD.  Potassium 5.5 on admission.  Resolved.  Monitor BMP periodically across HD.  Chronic hypotension  Stable on midodrine, continue.    Patient  gets midodrine only on dialysis days.   Intermittent hypotension but mostly asymptomatic.  Monitor.  Chronic systolic CHF/ICD/PPM  Compensated.  Volume management across HD.  No recent TTE in system, can consider doing  outpatient.  Follows with [name], patient's PCP/Cardiology.  I updated [name] on 7/15 regarding patient's admission when he wanted the hospitalist to continue his care.  Diet-controlled DM 2 with renal complications  M3T 59/02/4162: 4.7.  CBGs well controlled on diet alone, discontinued CBG checks.  Had asymptomatic hypoglycemia CBGs in the 60s on 7/17 when he was n.p.o. for procedures.  CBGs ordered but were never done yesterday.  Discussed with RN today, 136 this morning.  More importantly may need to monitor CBGs while he is n.p.o.  Removed diabetic diet restrictions.  No further hypoglycemic range CBGs.   DVT prophylaxis: SCD Code Status: Full Family Communication: I called and discussed with patient's daughter via phone on 7/18, updated care and answered questions.  She was appreciative of the phone call and updates. Disposition: To be determined pending clinical improvement,?  7/24 pending left third and fourth toe amputation on 7/23 by podiatry.   Consultants:  Vascular surgery Nephrology  Procedures:  HD on 7/17, 7/20 Angioplasty of the left tibioperoneal trunk and posterior tibial artery on 7/17.  Antimicrobials:  IV vancomycin and Zosyn 7/14 >   Subjective: Patient appears to be in good spirits.  Denies complaints or rest pain in his legs.  Awaiting amputations on Thursday.  As per RN, no acute issues noted.  Objective:  Vitals:   03/01/19 0140 03/01/19 0300 03/01/19 0733 03/01/19 1053  BP: (!) 92/50  (!) 90/45 (!) 100/46  Pulse: (!) 113  84 84  Resp:   19 20  Temp:   98.3 F (36.8 C) 97.6 F (36.4 C)  TempSrc:   Oral Oral  SpO2: 96%  91% 98%  Weight:  80 kg    Height:        Examination:  General exam: Pleasant elderly male, moderately built and nourished lying comfortably supine in bed without distress. Respiratory system: Clear to auscultation.  No increased work of breathing..  Left IJ tunneled HD catheter without acute findings.   Cardiovascular system: S1 and S2 heard, RRR.  No JVD, murmurs or pedal edema.  Not on telemetry at this time.  Stable. Gastrointestinal system: Abdomen is nondistended, soft and nontender.  No organomegaly or masses appreciated.  Normal bowel sounds heard.  Stable without change. Central nervous system: Alert and oriented. No focal neurological deficits. Extremities: Symmetric 5 x 5 power. Skin: Left third and fourth toe with black discoloration and appeared shriveled up without drainage or acute finding, superficial wound on the medial aspect of the left fourth toe. No bleeding. Right foot s/p fifth toe amputation with healed site.  Right lateral forefoot with large deep but clean wound without acute findings of infection.  Please see pictures below from admission.  As per vascular follow-up, "R peroneal and PT soft by doppler; L AT and PT soft by doppler" Psychiatry: Judgement and insight appear normal. Mood & affect appropriate.         Data Reviewed: I have personally reviewed following labs and imaging studies  CBC: Recent Labs  Lab 02/22/19 1900 02/23/19 0241 02/25/19 0740 02/26/19 0238 02/28/19 0641  WBC 8.9 8.2 9.1 8.4 10.9*  NEUTROABS 6.9  --   --   --   --   HGB 14.2 14.2 12.8* 12.5* 11.6*  HCT 46.6 44.5 40.9  39.3 36.3*  MCV 103.1* 99.1 100.7* 98.7 98.4  PLT 290 260 310 286 671   Basic Metabolic Panel: Recent Labs  Lab 02/22/19 1900 02/23/19 0241 02/25/19 0740 02/26/19 0238 02/28/19 0641  NA 132* 133* 134* 134* 132*  K 5.5* 5.0 3.8 3.7 3.7  CL 96* 99 99 98 96*  CO2 18* 18* 19* 23 22  GLUCOSE 125* 103* 81 146* 107*  BUN 50* 56* 45* 22 40*  CREATININE 9.64* 9.77* 9.04* 6.21* 9.74*  CALCIUM 9.7 9.4 9.0 8.9 9.1  PHOS  --   --  2.7  --  2.9   Liver Function Tests: Recent Labs  Lab 02/22/19 1900 02/25/19 0740 02/28/19 0641  AST 18  --   --   ALT 16  --   --   ALKPHOS 58  --   --   BILITOT 0.6  --   --   PROT 8.6*  --   --   ALBUMIN 3.4* 2.8* 2.5*    CBG: Recent Labs  Lab 02/28/19 0610 02/28/19 0922 02/28/19 2139 03/01/19 0627 03/01/19 1109  GLUCAP 104* 116* 160* 81 118*    Recent Results (from the past 240 hour(s))  Blood culture (routine x 2)     Status: None   Collection Time: 02/22/19  7:45 PM   Specimen: BLOOD  Result Value Ref Range Status   Specimen Description BLOOD BLOOD LEFT WRIST  Final   Special Requests   Final    BOTTLES DRAWN AEROBIC AND ANAEROBIC Blood Culture adequate volume   Culture   Final    NO GROWTH 5 DAYS Performed at [facility], [place] [address], [place], [place] [address]    Report Status 02/27/2019 FINAL  Final  Blood culture (routine x 2)     Status: None   Collection Time: 02/22/19  8:20 PM   Specimen: BLOOD RIGHT HAND  Result Value Ref Range Status   Specimen Description BLOOD RIGHT HAND  Final   Special Requests   Final    BOTTLES DRAWN AEROBIC AND ANAEROBIC Blood Culture results may not be optimal due to an inadequate volume of blood received in culture bottles   Culture   Final    NO GROWTH 5 DAYS Performed at [facility], [place] [address], [place], [place] [address]    Report Status 02/27/2019 FINAL  Final  SARS Coronavirus 2 (CEPHEID - Performed in [place] hospital lab), Hosp Order     Status: None   Collection Time: 02/22/19  9:29 PM   Specimen: Nasopharyngeal Swab  Result Value Ref Range Status   SARS Coronavirus 2 NEGATIVE NEGATIVE Final    Comment: (NOTE) If result is NEGATIVE SARS-CoV-2 target nucleic acids are NOT DETECTED. The SARS-CoV-2 RNA is generally detectable in upper and lower  respiratory specimens during the acute phase of infection. The lowest  concentration of SARS-CoV-2 viral copies this assay can detect is 250  copies / mL. A negative result does not preclude SARS-CoV-2 infection  and should not be used as the sole basis for treatment or other  patient management decisions.  A negative result may occur with  improper specimen collection /  handling, submission of specimen other  than nasopharyngeal swab, presence of viral mutation(s) within the  areas targeted by this assay, and inadequate number of viral copies  (<250 copies / mL). A negative result must be combined with clinical  observations, patient history, and epidemiological information. If result is POSITIVE SARS-CoV-2 target nucleic  acids are DETECTED. The SARS-CoV-2 RNA is generally detectable in upper and lower  respiratory specimens dur ing the acute phase of infection.  Positive  results are indicative of active infection with SARS-CoV-2.  Clinical  correlation with patient history and other diagnostic information is  necessary to determine patient infection status.  Positive results do  not rule out bacterial infection or co-infection with other viruses. If result is PRESUMPTIVE POSTIVE SARS-CoV-2 nucleic acids MAY BE PRESENT.   A presumptive positive result was obtained on the submitted specimen  and confirmed on repeat testing.  While 2019 novel coronavirus  (SARS-CoV-2) nucleic acids may be present in the submitted sample  additional confirmatory testing may be necessary for epidemiological  and / or clinical management purposes  to differentiate between  SARS-CoV-2 and other Sarbecovirus currently known to infect humans.  If clinically indicated additional testing with an alternate test  methodology [phone]) is advised. The SARS-CoV-2 RNA is generally  detectable in upper and lower respiratory sp ecimens during the acute  phase of infection. The expected result is Negative. Fact Sheet for Patients:  StrictlyIdeas.no Fact Sheet for Healthcare Providers: BankingDealers.co.za This test is not yet approved or cleared by the [place] FDA and has been authorized for detection and/or diagnosis of SARS-CoV-2 by FDA under an Emergency Use Authorization (EUA).  This EUA will remain in effect (meaning this  test can be used) for the duration of the COVID-19 declaration under Section 564(b)(1) of the Act, 21 U.S.C. section 360bbb-3(b)(1), unless the authorization is terminated or revoked sooner. Performed at [facility], [place] [address], [place], [place] [address]   MRSA PCR Screening     Status: None   Collection Time: 02/23/19  2:16 AM   Specimen: Nasal Mucosa; Nasopharyngeal  Result Value Ref Range Status   MRSA by PCR NEGATIVE NEGATIVE Final    Comment:        The GeneXpert MRSA Assay (FDA approved for NASAL specimens only), is one component of a comprehensive MRSA colonization surveillance program. It is not intended to diagnose MRSA infection nor to guide or monitor treatment for MRSA infections. Performed at [facility], [place] [address], [place], [place] [address]          Radiology Studies: No results found.      Scheduled Meds: . aspirin EC  81 mg Oral Daily  . calcitRIOL  0.5 mcg Oral Q M,W,F-HD  . Chlorhexidine Gluconate Cloth  6 each Topical Q0600  . cinacalcet  30 mg Oral Q M,W,F-HD  . clopidogrel  75 mg Oral Q breakfast  . collagenase  1 application Topical Daily  . feeding supplement (NEPRO CARB STEADY)  237 mL Oral BID BM  . feeding supplement (PRO-STAT SUGAR FREE 64)  30 mL Oral BID  . ferric citrate  420 mg Oral TID WC  . gabapentin  100 mg Oral QHS  . midodrine  10 mg Oral Q M,W,F-HD  . multivitamin  1 tablet Oral QHS  . pantoprazole  40 mg Oral Q0600  . rosuvastatin  10 mg Oral q1800  . sodium chloride flush  3 mL Intravenous Q12H  . sodium chloride flush  3 mL Intravenous Q12H   Continuous Infusions: . sodium chloride    . sodium chloride    . piperacillin-tazobactam (ZOSYN)  IV 3.375 g (02/28/19 2146)  . vancomycin 750 mg (02/28/19 1256)     LOS: 7 days     Vernell Leep, MD, FACP, [facility]. [facility]  To contact the attending provider between 7A-7P or the covering provider during after hours 7P-7A, please log  into the web site [email or web address] and access using universal [place] password for that web site. If you do not have the password, please call the hospital operator.  03/01/2019, 11:33 AM

## 2019-03-01 NOTE — Progress Notes (Addendum)
  Progress Note    03/01/2019 8:31 AM 1 Day Post-Op  Subjective:  No complaints this morning.  Denies rest pain BLE   Vitals:   03/01/19 0140 03/01/19 0733  BP: (!) 92/50 (!) 90/45  Pulse: (!) 113 84  Resp:  19  Temp:  98.3 F (36.8 C)  SpO2: 96% 91%   Physical Exam: Lungs:  Non labored Incisions:  L groin cath site without bleeding or palpable hematoma Extremities:  R peroneal and soft PT by doppler; L AT and PT soft by doppler; dressing left in place R foot Neurologic: A&O  CBC    Component Value Date/Time   WBC 10.9 (H) 02/28/2019 0641   RBC 3.69 (L) 02/28/2019 0641   HGB 11.6 (L) 02/28/2019 0641   HGB 15.2 05/11/2018 1219   HCT 36.3 (L) 02/28/2019 0641   PLT 297 02/28/2019 0641   PLT 245 05/11/2018 1219   MCV 98.4 02/28/2019 0641   MCH 31.4 02/28/2019 0641   MCHC 32.0 02/28/2019 0641   RDW 15.9 (H) 02/28/2019 0641   LYMPHSABS 0.9 02/22/2019 1900   MONOABS 1.1 (H) 02/22/2019 1900   EOSABS 0.0 02/22/2019 1900   BASOSABS 0.0 02/22/2019 1900    BMET    Component Value Date/Time   NA 132 (L) 02/28/2019 0641   K 3.7 02/28/2019 0641   CL 96 (L) 02/28/2019 0641   CO2 22 02/28/2019 0641   GLUCOSE 107 (H) 02/28/2019 0641   BUN 40 (H) 02/28/2019 0641   CREATININE 9.74 (H) 02/28/2019 0641   CREATININE 9.49 (HH) 05/11/2018 1219   CALCIUM 9.1 02/28/2019 0641   GFRNONAA 5 (L) 02/28/2019 0641   GFRNONAA 5 (L) 05/11/2018 1219   GFRAA 6 (L) 02/28/2019 0641   GFRAA 6 (L) 05/11/2018 1219    INR    Component Value Date/Time   INR 1.24 03/12/2013 0553     Intake/Output Summary (Last 24 hours) at 03/01/2019 0831 Last data filed at 03/01/2019 7262 Gross per 24 hour  Intake 200 ml  Output 2090 ml  Net -1890 ml     Assessment/Plan:  73 y.o. male is s/p RLE TP trunk atherectomy 1 Day Post-Op   L groin cath site unremarkable R peroneal and PT soft by doppler; L AT and PT soft by doppler Continue current wound care R foot Podiatry planning L toe amps Thursday  03/03/19   Dagoberto Ligas, PA-C [facility] [phone] 03/01/2019 8:31 AM   I have independently interviewed and examined patient and agree with PA assessment and plan above.  Hopefully will have better healing of the right foot wound and can heal toe amputations currently planned by podiatry on Thursday this week.  Tiegan Jambor C. Donzetta Matters, MD [facility] [place] Office: [phone] Pager: [phone]

## 2019-03-01 NOTE — Care Management Important Message (Signed)
Important Message  Patient Details  Name: Timothy Faulkner MRN: [id number] Date of Birth: 1945-10-05   Medicare Important Message Given:  Yes     Memory Argue 03/01/2019, 3:19 PM

## 2019-03-01 NOTE — Plan of Care (Signed)

## 2019-03-01 NOTE — Progress Notes (Signed)
Subjective:  Seen in room, no new c/o, slept good last night.  Objective Vital signs in last 24 hours: Vitals:   03/01/19 0140 03/01/19 0300 03/01/19 0733 03/01/19 1053  BP: (!) 92/50  (!) 90/45 (!) 100/46  Pulse: (!) 113  84 84  Resp:   19 20  Temp:   98.3 F (36.8 C) 97.6 F (36.4 C)  TempSrc:   Oral Oral  SpO2: 96%  91% 98%  Weight:  80 kg    Height:       Weight change: -2.3 kg  Intake/Output Summary (Last 24 hours) at 03/01/2019 1149 Last data filed at 03/01/2019 0733 Gross per 24 hour  Intake 200 ml  Output -  Net 200 ml    Dialysis Orders:  MWF [facility]  4h  400/800  79kg  3K/2.5 bath  RIJ TDC   Hep 5000 +2000prn Calcitriol 0.5 mcg PO qHD   Assessment/Plan: 1.  Cellulitis w/critical limb ischemia: w/gangreous toes of L foot and bilat LE ischemia. SP perc revasc to LLE on 7/17 and to RLE on 7/20. To have L foot toe amp surgery on 7/23.   2.  ESRD - on HD MWF.  HD Wed.  3.  HypOtension/volume  - BP's soft, on midodrine pre HD. Volume status stable, close to EDW.   4.  Anemia of CKD - Hgb 14.2--> 12. No indication for ESA at this time.  5.  Secondary Hyperparathyroidism -  Ca in goal.   phos low. Continue calcitriol and binders - auryxia 3AC TID- -  dec to 2 TID. Also on sensipar 6.  Nutrition - Renal diet with fluid restrictions, vit 7. DMT2 8. Hx PVD s/p stent placement in Dec 2019 for RLE 9. Chronic systolic HF s/p ICD placement    [name], MD 03/01/2019, 11:49 AM        Labs: Basic Metabolic Panel: Recent Labs  Lab 02/25/19 0740 02/26/19 0238 02/28/19 0641  NA 134* 134* 132*  K 3.8 3.7 3.7  CL 99 98 96*  CO2 19* 23 22  GLUCOSE 81 146* 107*  BUN 45* 22 40*  CREATININE 9.04* 6.21* 9.74*  CALCIUM 9.0 8.9 9.1  PHOS 2.7  --  2.9   Liver Function Tests: Recent Labs  Lab 02/22/19 1900 02/25/19 0740 02/28/19 0641  AST 18  --   --   ALT 16  --   --   ALKPHOS 58  --   --   BILITOT 0.6  --   --   PROT 8.6*  --   --   ALBUMIN 3.4* 2.8*  2.5*   No results for input(s): LIPASE, AMYLASE in the last 168 hours. No results for input(s): AMMONIA in the last 168 hours. CBC: Recent Labs  Lab 02/22/19 1900 02/23/19 0241 02/25/19 0740 02/26/19 0238 02/28/19 0641  WBC 8.9 8.2 9.1 8.4 10.9*  NEUTROABS 6.9  --   --   --   --   HGB 14.2 14.2 12.8* 12.5* 11.6*  HCT 46.6 44.5 40.9 39.3 36.3*  MCV 103.1* 99.1 100.7* 98.7 98.4  PLT 290 260 310 286 297   Cardiac Enzymes: No results for input(s): CKTOTAL, CKMB, CKMBINDEX, TROPONINI in the last 168 hours. CBG: Recent Labs  Lab 02/28/19 0610 02/28/19 0922 02/28/19 2139 03/01/19 0627 03/01/19 1109  GLUCAP 104* 116* 160* 81 118*    Iron Studies: No results for input(s): IRON, TIBC, TRANSFERRIN, FERRITIN in the last 72 hours. Studies/Results: No results found. Medications: Infusions: . sodium  chloride    . sodium chloride    . piperacillin-tazobactam (ZOSYN)  IV 3.375 g (02/28/19 2146)  . vancomycin 750 mg (02/28/19 1256)    Scheduled Medications: . aspirin EC  81 mg Oral Daily  . calcitRIOL  0.5 mcg Oral Q M,W,F-HD  . Chlorhexidine Gluconate Cloth  6 each Topical Q0600  . cinacalcet  30 mg Oral Q M,W,F-HD  . clopidogrel  75 mg Oral Q breakfast  . collagenase  1 application Topical Daily  . feeding supplement (NEPRO CARB STEADY)  237 mL Oral BID BM  . feeding supplement (PRO-STAT SUGAR FREE 64)  30 mL Oral BID  . ferric citrate  420 mg Oral TID WC  . gabapentin  100 mg Oral QHS  . midodrine  10 mg Oral Q M,W,F-HD  . multivitamin  1 tablet Oral QHS  . pantoprazole  40 mg Oral Q0600  . rosuvastatin  10 mg Oral q1800  . sodium chloride flush  3 mL Intravenous Q12H  . sodium chloride flush  3 mL Intravenous Q12H    have reviewed scheduled and prn medications.  Physical Exam: General: NAD Heart: RRR Lungs: mostly clear Abdomen: soft, non tender Extremities: no edema-  Both feet bandaged  Dialysis Access: TDC     03/01/2019,11:49 AM  LOS: 7 days

## 2019-03-01 NOTE — Progress Notes (Signed)
Physical Therapy Treatment Patient Details Name: Timothy Faulkner MRN: [id number] DOB: 1946/04/22 Today's Date: 03/01/2019    History of Present Illness This is a 73 y.o. male with past medical history significant for end-stage renal disease on hemodialysis via left IJ TDC, CHF, hypertension, hyperlipidemia, diabetes mellitus.  He is being seen in consultation for evaluation of gangrenous toes with purulent discharge of left foot.  Surgical history significant for right fifth toe amputation and right TP trunk and peroneal stenting by Dr. Carlis Abbott via retrograde peroneal artery approach in December 2019.  At that time left lower extremity runoff demonstrated patent SFA, popliteal, and occlusion of all 3 tibial vessels.  He states the wounds of his left third and fourth toes have worsened over the past week.    PT Comments    Patient seen for mobility progression. Pt is making good progress toward PT goals and tolerated mobility well. Current plan remains appropriate.    Follow Up Recommendations  Home health PT;Supervision/Assistance - 24 hour     Equipment Recommendations  None recommended by PT    Recommendations for Other Services       Precautions / Restrictions Precautions Precautions: Fall    Mobility  Bed Mobility Overal bed mobility: Needs Assistance Bed Mobility: Supine to Sit     Supine to sit: Min assist     General bed mobility comments: assist to elevate trunk into sitting   Transfers Overall transfer level: Modified independent                  Ambulation/Gait Ambulation/Gait assistance: Supervision Gait Distance (Feet): 250 Feet Assistive device: Rolling walker (2 wheeled) Gait Pattern/deviations: Step-through pattern;Decreased stride length Gait velocity: decreased   General Gait Details: supervision for safety; slow, steady gait; easily distracted    Stairs             Wheelchair Mobility    Modified Rankin (Stroke Patients Only)        Balance Overall balance assessment: Needs assistance Sitting-balance support: No upper extremity supported;Feet supported Sitting balance-Leahy Scale: Good     Standing balance support: During functional activity;Single extremity supported Standing balance-Leahy Scale: Poor                              Cognition Arousal/Alertness: Awake/alert Behavior During Therapy: WFL for tasks assessed/performed Overall Cognitive Status: Within Functional Limits for tasks assessed                                        Exercises      General Comments        Pertinent Vitals/Pain Pain Assessment: Faces Faces Pain Scale: No hurt    Home Living                      Prior Function            PT Goals (current goals can now be found in the care plan section) Progress towards PT goals: Progressing toward goals    Frequency    Min 3X/week      PT Plan Current plan remains appropriate    Co-evaluation              AM-PAC PT "6 Clicks" Mobility   Outcome Measure  Help needed turning from your back to your side while in  a flat bed without using bedrails?: None Help needed moving from lying on your back to sitting on the side of a flat bed without using bedrails?: A Little Help needed moving to and from a bed to a chair (including a wheelchair)?: A Little Help needed standing up from a chair using your arms (e.g., wheelchair or bedside chair)?: A Little Help needed to walk in hospital room?: A Little Help needed climbing 3-5 steps with a railing? : A Little 6 Click Score: 19    End of Session Equipment Utilized During Treatment: Gait belt Activity Tolerance: Patient tolerated treatment well Patient left: with call bell/phone within reach;in bed;with nursing/sitter in room Nurse Communication: Mobility status PT Visit Diagnosis: Muscle weakness (generalized) (M62.81)     Time: 0539-7673 PT Time Calculation (min) (ACUTE  ONLY): 24 min  Charges:  $Gait Training: 23-37 mins                     Earney Navy, PTA Acute Rehabilitation Services Pager: [phone] Office: [phone]     Darliss Cheney 03/01/2019, 4:06 PM

## 2019-03-02 ENCOUNTER — Other Ambulatory Visit: Payer: Self-pay | Admitting: *Deleted

## 2019-03-02 ENCOUNTER — Telehealth: Payer: Self-pay | Admitting: *Deleted

## 2019-03-02 DIAGNOSIS — Z01818 Encounter for other preprocedural examination: Secondary | ICD-10-CM

## 2019-03-02 LAB — GLUCOSE, CAPILLARY
Glucose-Capillary: 105 mg/dL — ABNORMAL HIGH (ref 70–99)
Glucose-Capillary: 98 mg/dL (ref 70–99)
Glucose-Capillary: 228 mg/dL — ABNORMAL HIGH (ref 70–99)

## 2019-03-02 LAB — RENAL FUNCTION PANEL
Albumin: 2.5 g/dL — ABNORMAL LOW (ref 3.5–5.0)
Anion gap: 16 — ABNORMAL HIGH (ref 5–15)
BUN: 42 mg/dL — ABNORMAL HIGH (ref 8–23)
CO2: 21 mmol/L — ABNORMAL LOW (ref 22–32)
Calcium: 9.2 mg/dL (ref 8.9–10.3)
Chloride: 96 mmol/L — ABNORMAL LOW (ref 98–111)
Creatinine, Ser: 9.45 mg/dL — ABNORMAL HIGH (ref 0.61–1.24)
GFR calc Af Amer: 6 mL/min — ABNORMAL LOW (ref >60)
GFR calc non Af Amer: 5 mL/min — ABNORMAL LOW (ref >60)
Glucose, Bld: 92 mg/dL (ref 70–99)
Phosphorus: 3.3 mg/dL (ref 2.5–4.6)
Potassium: 3.2 mmol/L — ABNORMAL LOW (ref 3.5–5.1)
Sodium: 133 mmol/L — ABNORMAL LOW (ref 135–145)

## 2019-03-02 LAB — CBC
HCT: 36.3 % — ABNORMAL LOW (ref 39.0–52.0)
Hemoglobin: 11.6 g/dL — ABNORMAL LOW (ref 13.0–17.0)
MCH: 31.6 pg (ref 26.0–34.0)
MCHC: 32 g/dL (ref 30.0–36.0)
MCV: 98.9 fL (ref 80.0–100.0)
Platelets: 319 10*3/uL (ref 150–400)
RBC: 3.67 MIL/uL — ABNORMAL LOW (ref 4.22–5.81)
RDW: 16.3 % — ABNORMAL HIGH (ref 11.5–15.5)
WBC: 10.1 10*3/uL (ref 4.0–10.5)
nRBC: 0 % (ref 0.0–0.2)

## 2019-03-02 MED ORDER — VANCOMYCIN HCL IN DEXTROSE 750-5 MG/150ML-% IV SOLN
INTRAVENOUS | Status: AC
  Administered 2019-03-02: 750 mg
  Filled 2019-03-02: qty 150

## 2019-03-02 MED ORDER — CALCITRIOL 0.5 MCG PO CAPS
ORAL_CAPSULE | ORAL | Status: AC
  Administered 2019-03-02: 0.5 ug
  Filled 2019-03-02: qty 1

## 2019-03-02 MED ORDER — MIDODRINE HCL 5 MG PO TABS
ORAL_TABLET | ORAL | Status: AC
  Filled 2019-03-02: qty 2

## 2019-03-02 MED ORDER — HEPARIN SODIUM (PORCINE) 1000 UNIT/ML DIALYSIS: 3000.0000 [IU] | INTRAMUSCULAR | Status: DC | PRN

## 2019-03-02 MED ORDER — HEPARIN SODIUM (PORCINE) 1000 UNIT/ML IJ SOLN
INTRAMUSCULAR | Status: AC
  Filled 2019-03-02: qty 4

## 2019-03-02 NOTE — Telephone Encounter (Signed)
I am calling to let you know [name] was able to get your dad scheduled for Tuesday of next week at [facility].  I apologize for all the confusion and all the phone calls.  "You're alright.  What time will his surgery be?"  His surgery will start at 8:45 am I think but he'll need to be there about two hours prior to that time.  I have the bag ready with the forms in it at the front desk.  "I'll stop by tomorrow morning to pick it up."  Where does he need to go to have the Covid test?  Does he need to have an appointment?"  He will go to [facility].  He'll probably need to have that done on Friday.  A pre-admit scheduler will give him a call to set up that appointment.  "Okay, sounds good."  Pre-op kit and history & physical forms were put at the front desk.

## 2019-03-02 NOTE — Telephone Encounter (Signed)
I am trying to get in contact with Timothy Faulkner.  I tried to call him on his mobile.  "He's not here."  I know he's in the hospital.  I was calling to let him know that [name] is sick so we must cancel his surgery that's scheduled for tomorrow.  "I didn't know he was having surgery.  No one has told me anything.  It's hard to stay informed when we're not allowed to be in the hospital."  [name] said he can do it outpatient.  "When can he do it?  Will he be leaving the hospital?"  [name] said he could possibly do it on Saturday while he's inpatient.  He said he could have his vascular doctor do it if he wants to have it done right away.  "Go ahead and schedule it for Saturday while he's at the hospital."  I'll keep you posted.  Per [name], the hospitalist said the patient would have to do it outpatient.  I rescheduled his surgery to [facility] on March 10, 2019.  We may possibly have to change it to [facility] if the time becomes available because the patient may have to be admitted.   I am calling to let you know that your dad is probably going to be discharged.  The Hospitalist said he could not stay in the hospital until the surgery on Saturday.  So, we have scheduled him for surgery at [facility] on July 30.  He's going to need a history and physical form completed by his primary care physician and he needs a Covid test prior to the surgery date.  "Well how can we get the vascular doctor to do it?  Will you contact him or is my dad supposed to ask?"  [name] said we can plan for the surgery to be done outpatient.  He said it's perfectly ok to wait and it will allow more time for his circulation to improve prior to the amputation.  "Okay, that is fine."  Can you come by the office to get the forms?  "Yes, I'll stop by there to get it."  I'll leave everything at the front desk.  He's also going to need a Covid test prior to his surgery date.  Someone from pre-surgical testing  will give him a call to schedule that appointment.

## 2019-03-02 NOTE — Progress Notes (Signed)
PT Cancellation Note  Patient Details Name: Timothy Faulkner MRN: [id number] DOB: 1946-05-08   Cancelled Treatment:    Reason Eval/Treat Not Completed: Patient at procedure or test/unavailable(Pt in HD.  Will return as able.  )   [name] 03/02/2019, 11:38 AM  [name] Acute Rehabilitation Services Pager:  [phone]  Office:  [phone]

## 2019-03-02 NOTE — Progress Notes (Addendum)
PROGRESS NOTE   Timothy Faulkner  DXA:[id number]    DOB: 1946/07/18    DOA: 02/22/2019  PCP: [name], MD   I have briefly reviewed patients previous medical records in [facility].  Chief Complaint  Patient presents with  . Wound Check    Brief Narrative:  73 year old male, lives with spouse and daughter, ambulates with help of a walker, PMH of ESRD on MWF HD, PAD s/p right peroneal stent and right fifth toe amputation for osteomyelitis, anemia, gout, chronic systolic CHF, AICD, HLD, HTN,?  DM 2, chronic hypotension, presented with several days history of left third and fourth toe darkish discoloration, wound with foul-smelling drainage, worsened in the day or 2 prior to admission, seen by OP podiatry and referred to ED for admission.  Admitted for suspected left third and fourth toe gangrene with cellulitis.  Vascular surgery performed successful angioplasty of left tibioperoneal trunk and posterior tibial artery on 7/17 and right lower extremity tibioperoneal trunk atherectomy on 7/20.  Plans for left third and fourth toes amputation by podiatry on 03/03/2019.  Nephrology consulting for HD needs.   Assessment & Plan:   Principal Problem:   Cellulitis of left foot Active Problems:   Automatic implantable cardioverter-defibrillator in situ   ESRD (end stage renal disease) on dialysis ([name])   Anemia associated with chronic renal failure   Cardiomyopathy, dilated, nonischemic (HCC)   PAD (peripheral artery disease) (HCC)   Gangrene of toe of left foot (HCC)   Hypotension   PAD with critical limb ischemia/left third and fourth toe gangrene, superficial wound with cellulitis.  X-ray without acute findings.  Empirically started on IV vancomycin and Zosyn (both 7/14 >) per pharmacy which he is getting across HD.  Blood cultures x2: Negative, final report.  Consider stopping antibiotics 24 to 48 hours after surgery.  Cellulitis is clinically resolved.  Vascular surgery  performed successful angioplasty of the left tibioperoneal trunk and posterior tibial artery on 7/17.  Vascular surgery performed right lower extremity tibioperoneal trunk atherectomy for nonhealing right foot ulcer on 7/20.  Continue aspirin and Plavix.  [name], Podiatry communicated with me late this afternoon that he unfortunately will not be able to perform surgery until Saturday and that patient could be discharged home, see him in the office on Friday for possible surgery as outpatient on Tuesday of next week.  Stable without change.  S/p right fifth toe amputation, chronic nonhealing right lateral foot wound  Nonhealing ulcer but clean without clinical evidence of infection.  X-ray without features of osteomyelitis.  Continue wound care and outpatient follow-up.  Vascular surgery and podiatry input appreciated.  S/p RLE TP trunk atherectomy 7/20 and hopefully can have better healing of the right foot wound post revascularization.  Stable without change.  ESRD on MWF HD  Nephrology consulting for HD needs.  Last HD 7/17, 7/20, next HD 7/22.  Dialyzed via left IJ tunneled HD catheter.  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  Patient follows up with [name], Nephrology.  Patient lost intermittently lost PIV access.  Do not see need for long-term IV access at this time.  Discussed in detail with [name], Nephrology, may use HD catheter for emergency IV needs, avoid PICC line due to history of multiple failed AV fistulas and if IV access absolutely warranted then can consider central line.  Currently has a PIV.  Seen  at dialysis this morning and doing well without complaints.  Hyperkalemia/AG metabolic acidosis  Secondary to ESRD on HD.  Potassium 5.5 on admission.  Resolved.  Monitor BMP periodically across HD.  Chronic hypotension  Stable on midodrine,  continue.    Patient gets midodrine only on dialysis days.   Intermittent hypotension but mostly asymptomatic.  Monitor.  Chronic systolic CHF/ICD/PPM  Compensated.  Volume management across HD.  No recent TTE in system, can consider doing outpatient.  Follows with [name], patient's PCP/Cardiology.  I updated [name] on 7/15 regarding patient's admission when he wanted the hospitalist to continue his care.  Diet-controlled DM 2 with renal complications  Q2W 97/04/8920: 4.7.  CBGs well controlled on diet alone, discontinued CBG checks.  Had asymptomatic hypoglycemia CBGs in the 60s on 7/17 when he was n.p.o. for procedures.  CBGs ordered but were never done yesterday.  Discussed with RN today, 136 this morning.  More importantly may need to monitor CBGs while he is n.p.o.  Removed diabetic diet restrictions.  No further hypoglycemic range CBGs.   DVT prophylaxis: SCD Code Status: Full Family Communication: I called and discussed with patient's daughter via phone on 7/22, updated care and answered questions.  She was appreciative of the phone call and updates. Disposition: To be determined pending clinical improvement,?  7/24 pending left third and fourth toe amputation on 7/23 by podiatry.   Consultants:  Vascular surgery Nephrology  Procedures:  HD on 7/17, 7/20 Angioplasty of the left tibioperoneal trunk and posterior tibial artery on 7/17.  Antimicrobials:  IV vancomycin and Zosyn 7/14 >   Subjective: Seen this morning at dialysis.  Denies complaints.  Awaiting amputation tomorrow.  Looking forward to returning home soon.  Objective:  Vitals:   03/02/19 0830 03/02/19 0900 03/02/19 0930 03/02/19 1000  BP: (!) 85/56 (!) 90/45 101/79 (!) 131/59  Pulse: 93 (!) 57 65 93  Resp:      Temp:      TempSrc:      SpO2:      Weight:      Height:        Examination:  General exam: Pleasant elderly male, moderately built and nourished lying comfortably supine in  bed without distress. Respiratory system: Clear to auscultation.  No increased work of breathing.  Left IJ tunneled HD catheter without acute findings.  Cardiovascular system: S1 and S2 heard, RRR.  No JVD, murmurs or pedal edema.  Not on telemetry at this time.  Stable. Gastrointestinal system: Abdomen is nondistended, soft and nontender.  No organomegaly or masses appreciated.  Normal bowel sounds heard.  Stable without change. Central nervous system: Alert and oriented. No focal neurological deficits. Extremities: Symmetric 5 x 5 power. Skin: Left third and fourth toe with black discoloration and appeared shriveled up without drainage or acute finding, superficial wound on the medial aspect of the left fourth toe. No bleeding. Right foot s/p fifth toe amputation with healed site.  Right lateral forefoot with large deep but clean wound without acute findings of infection.  Please see pictures below from admission.  As per vascular follow-up, "R peroneal and PT soft by doppler; L AT and PT soft by doppler".  No change in findings compared to yesterday's exam. Psychiatry: Judgement and insight appear normal. Mood & affect appropriate.         Data Reviewed: I have personally reviewed following labs and imaging studies  CBC: Recent Labs  Lab 02/25/19 0740 02/26/19 0238 02/28/19 1941 03/02/19 0740  WBC 9.1 8.4 10.9* 10.1  HGB 12.8* 12.5* 11.6* 11.6*  HCT 40.9 39.3 36.3* 36.3*  MCV 100.7* 98.7 98.4 98.9  PLT 310 286 297 419   Basic Metabolic Panel: Recent Labs  Lab 02/25/19 0740 02/26/19 0238 02/28/19 0641 03/02/19 0741  NA 134* 134* 132* 133*  K 3.8 3.7 3.7 3.2*  CL 99 98 96* 96*  CO2 19* 23 22 21*  GLUCOSE 81 146* 107* 92  BUN 45* 22 40* 42*  CREATININE 9.04* 6.21* 9.74* 9.45*  CALCIUM 9.0 8.9 9.1 9.2  PHOS 2.7  --  2.9 3.3   Liver Function Tests: Recent Labs  Lab 02/25/19 0740 02/28/19 0641 03/02/19 0741  ALBUMIN 2.8* 2.5* 2.5*   CBG: Recent Labs  Lab 03/01/19  0627 03/01/19 1109 03/01/19 1559 03/01/19 2109 03/02/19 0606  GLUCAP 81 118* 113* 187* 105*    Recent Results (from the past 240 hour(s))  Blood culture (routine x 2)     Status: None   Collection Time: 02/22/19  7:45 PM   Specimen: BLOOD  Result Value Ref Range Status   Specimen Description BLOOD BLOOD LEFT WRIST  Final   Special Requests   Final    BOTTLES DRAWN AEROBIC AND ANAEROBIC Blood Culture adequate volume   Culture   Final    NO GROWTH 5 DAYS Performed at [facility], [address], [place], [place] [address]    Report Status 02/27/2019 FINAL  Final  Blood culture (routine x 2)     Status: None   Collection Time: 02/22/19  8:20 PM   Specimen: BLOOD RIGHT HAND  Result Value Ref Range Status   Specimen Description BLOOD RIGHT HAND  Final   Special Requests   Final    BOTTLES DRAWN AEROBIC AND ANAEROBIC Blood Culture results may not be optimal due to an inadequate volume of blood received in culture bottles   Culture   Final    NO GROWTH 5 DAYS Performed at [facility], [place] [address] [place], [place] [address]    Report Status 02/27/2019 FINAL  Final  SARS Coronavirus 2 (CEPHEID - Performed in [place] hospital lab), Hosp Order     Status: None   Collection Time: 02/22/19  9:29 PM   Specimen: Nasopharyngeal Swab  Result Value Ref Range Status   SARS Coronavirus 2 NEGATIVE NEGATIVE Final    Comment: (NOTE) If result is NEGATIVE SARS-CoV-2 target nucleic acids are NOT DETECTED. The SARS-CoV-2 RNA is generally detectable in upper and lower  respiratory specimens during the acute phase of infection. The lowest  concentration of SARS-CoV-2 viral copies this assay can detect is 250  copies / mL. A negative result does not preclude SARS-CoV-2 infection  and should not be used as the sole basis for treatment or other  patient management decisions.  A negative result may occur with  improper specimen collection / handling, submission of specimen other   than nasopharyngeal swab, presence of viral mutation(s) within the  areas targeted by this assay, and inadequate number of viral copies  (<250 copies / mL). A negative result must be combined with clinical  observations, patient history, and epidemiological information. If result is POSITIVE SARS-CoV-2 target nucleic acids are DETECTED. The SARS-CoV-2 RNA is generally detectable in upper and lower  respiratory specimens dur ing the acute phase of infection.  Positive  results are indicative of active infection with SARS-CoV-2.  Clinical  correlation with patient history and other diagnostic information is  necessary to determine patient infection status.  Positive results do  not rule out bacterial infection or co-infection with other viruses. If result is PRESUMPTIVE POSTIVE SARS-CoV-2 nucleic acids MAY BE PRESENT.   A presumptive positive result was obtained on the submitted specimen  and confirmed on repeat testing.  While 2019 novel coronavirus  (SARS-CoV-2) nucleic acids may be present in the submitted sample  additional confirmatory testing may be necessary for epidemiological  and / or clinical management purposes  to differentiate between  SARS-CoV-2 and other Sarbecovirus currently known to infect humans.  If clinically indicated additional testing with an alternate test  methodology [phone]) is advised. The SARS-CoV-2 RNA is generally  detectable in upper and lower respiratory sp ecimens during the acute  phase of infection. The expected result is Negative. Fact Sheet for Patients:  StrictlyIdeas.no Fact Sheet for Healthcare Providers: BankingDealers.co.za This test is not yet approved or cleared by the [place] FDA and has been authorized for detection and/or diagnosis of SARS-CoV-2 by FDA under an Emergency Use Authorization (EUA).  This EUA will remain in effect (meaning this test can be used) for the duration of the  COVID-19 declaration under Section 564(b)(1) of the Act, 21 U.S.C. section 360bbb-3(b)(1), unless the authorization is terminated or revoked sooner. Performed at [facility], [place] [address] [place], [place] [address]   MRSA PCR Screening     Status: None   Collection Time: 02/23/19  2:16 AM   Specimen: Nasal Mucosa; Nasopharyngeal  Result Value Ref Range Status   MRSA by PCR NEGATIVE NEGATIVE Final    Comment:        The GeneXpert MRSA Assay (FDA approved for NASAL specimens only), is one component of a comprehensive MRSA colonization surveillance program. It is not intended to diagnose MRSA infection nor to guide or monitor treatment for MRSA infections. Performed at [facility], [place] [address] [place], [place] [address]          Radiology Studies: No results found.      Scheduled Meds: . aspirin EC  81 mg Oral Daily  . calcitRIOL  0.5 mcg Oral Q M,W,F-HD  . Chlorhexidine Gluconate Cloth  6 each Topical Q0600  . Chlorhexidine Gluconate Cloth  6 each Topical Q0600  . cinacalcet  30 mg Oral Q M,W,F-HD  . clopidogrel  75 mg Oral Q breakfast  . collagenase  1 application Topical Daily  . feeding supplement (NEPRO CARB STEADY)  237 mL Oral BID BM  . feeding supplement (PRO-STAT SUGAR FREE 64)  30 mL Oral BID  . ferric citrate  420 mg Oral TID WC  . gabapentin  100 mg Oral QHS  . midodrine  10 mg Oral Q M,W,F-HD  . multivitamin  1 tablet Oral QHS  . pantoprazole  40 mg Oral Q0600  . rosuvastatin  10 mg Oral q1800  . sodium chloride flush  3 mL Intravenous Q12H  . sodium chloride flush  3 mL Intravenous Q12H   Continuous Infusions: . sodium chloride    . sodium chloride    . piperacillin-tazobactam (ZOSYN)  IV 3.375 g (03/01/19 2216)  . vancomycin 750 mg (02/28/19 1256)     LOS: 8 days     Vernell Leep, MD, FACP, [facility]. [facility]  To contact the attending provider between 7A-7P or the covering provider during after hours 7P-7A,  please log into the web site [email or web address] and access using universal [place] password for that web site. If you  do not have the password, please call the hospital operator.  03/02/2019, 10:58 AM

## 2019-03-02 NOTE — Progress Notes (Signed)
Subjective:  Seen on HD, no c/o  Objective Vital signs in last 24 hours: Vitals:   03/02/19 0830 03/02/19 0900 03/02/19 0930 03/02/19 1000  BP: (!) 85/56 (!) 90/45 101/79 (!) 131/59  Pulse: 93 (!) 57 65 93  Resp:      Temp:      TempSrc:      SpO2:      Weight:      Height:       Weight change: 0.1 kg  Intake/Output Summary (Last 24 hours) at 03/02/2019 1141 Last data filed at 03/01/2019 1433 Gross per 24 hour  Intake 240 ml  Output -  Net 240 ml    Dialysis Orders:  [facility]  4h  400/800  79kg  3K/2.5 bath  RIJ TDC   Hep 5000 +2000prn Calcitriol 0.5 mcg PO qHD   Assessment/Plan: 1.  Cellulitis w/critical limb ischemia: w/gangreous toes of L foot and bilat LE ischemia. SP perc revasc to LLE on 7/17 and  RLE on 7/20. To have L toe amp surgery on 7/23.   2.  ESRD - on HD MWF.  HD today 3.  HypOtension/volume  - BP's soft, on midodrine pre HD. Volume status stable, close to EDW 4.  Anemia of CKD - Hgb 14.2--> 12. No indication for ESA at this time.  5.  Secondary Hyperparathyroidism -  Ca in goal.   phos low. Continue calcitriol and binders - auryxia 3AC TID- -  dec to 2 TID. Also on sensipar 6.  Nutrition - Renal diet with fluid restrictions, vit 7. DMT2 8. Hx PVD s/p stent placement in Dec 2019 for RLE 9. Chronic systolic HF s/p ICD placement    [name], MD 03/02/2019, 11:41 AM        Labs: Basic Metabolic Panel: Recent Labs  Lab 02/25/19 0740 02/26/19 0238 02/28/19 0641 03/02/19 0741  NA 134* 134* 132* 133*  K 3.8 3.7 3.7 3.2*  CL 99 98 96* 96*  CO2 19* 23 22 21*  GLUCOSE 81 146* 107* 92  BUN 45* 22 40* 42*  CREATININE 9.04* 6.21* 9.74* 9.45*  CALCIUM 9.0 8.9 9.1 9.2  PHOS 2.7  --  2.9 3.3   Liver Function Tests: Recent Labs  Lab 02/25/19 0740 02/28/19 0641 03/02/19 0741  ALBUMIN 2.8* 2.5* 2.5*   No results for input(s): LIPASE, AMYLASE in the last 168 hours. No results for input(s): AMMONIA in the last 168 hours. CBC: Recent Labs   Lab 02/25/19 0740 02/26/19 0238 02/28/19 0641 03/02/19 0740  WBC 9.1 8.4 10.9* 10.1  HGB 12.8* 12.5* 11.6* 11.6*  HCT 40.9 39.3 36.3* 36.3*  MCV 100.7* 98.7 98.4 98.9  PLT 310 286 297 319   Cardiac Enzymes: No results for input(s): CKTOTAL, CKMB, CKMBINDEX, TROPONINI in the last 168 hours. CBG: Recent Labs  Lab 03/01/19 0627 03/01/19 1109 03/01/19 1559 03/01/19 2109 03/02/19 0606  GLUCAP 81 118* 113* 187* 105*    Iron Studies: No results for input(s): IRON, TIBC, TRANSFERRIN, FERRITIN in the last 72 hours. Studies/Results: No results found. Medications: Infusions: . sodium chloride    . sodium chloride    . piperacillin-tazobactam (ZOSYN)  IV 3.375 g (03/01/19 2216)  . vancomycin 750 mg (02/28/19 1256)    Scheduled Medications: . aspirin EC  81 mg Oral Daily  . calcitRIOL  0.5 mcg Oral Q M,W,F-HD  . Chlorhexidine Gluconate Cloth  6 each Topical Q0600  . Chlorhexidine Gluconate Cloth  6 each Topical Q0600  . cinacalcet  30 mg Oral Q M,W,F-HD  . clopidogrel  75 mg Oral Q breakfast  . collagenase  1 application Topical Daily  . feeding supplement (NEPRO CARB STEADY)  237 mL Oral BID BM  . feeding supplement (PRO-STAT SUGAR FREE 64)  30 mL Oral BID  . ferric citrate  420 mg Oral TID WC  . gabapentin  100 mg Oral QHS  . heparin      . midodrine  10 mg Oral Q M,W,F-HD  . multivitamin  1 tablet Oral QHS  . pantoprazole  40 mg Oral Q0600  . rosuvastatin  10 mg Oral q1800  . sodium chloride flush  3 mL Intravenous Q12H  . sodium chloride flush  3 mL Intravenous Q12H    have reviewed scheduled and prn medications.  Physical Exam: General: NAD Heart: RRR Lungs: mostly clear Abdomen: soft, non tender Extremities: no edema-  Both feet bandaged  Dialysis Access: TDC     03/02/2019,11:41 AM  LOS: 8 days

## 2019-03-03 ENCOUNTER — Telehealth: Payer: Self-pay | Admitting: *Deleted

## 2019-03-03 LAB — GLUCOSE, CAPILLARY: Glucose-Capillary: 81 mg/dL (ref 70–99)

## 2019-03-03 MED ORDER — ROSUVASTATIN CALCIUM 10 MG PO TABS: 10.0000 mg | ORAL_TABLET | Freq: Every day | ORAL | 0 refills | Status: DC

## 2019-03-03 MED ORDER — PRO-STAT SUGAR FREE PO LIQD: 30.0000 mL | Freq: Two times a day (BID) | ORAL | 0 refills | Status: AC

## 2019-03-03 MED ORDER — NEPRO/CARBSTEADY PO LIQD: 237.0000 mL | Freq: Two times a day (BID) | ORAL | 12 refills | Status: DC

## 2019-03-03 MED ORDER — CALCITRIOL 0.5 MCG PO CAPS: 0.5000 ug | ORAL_CAPSULE | ORAL | Status: DC

## 2019-03-03 NOTE — TOC Transition Note (Signed)
Transition of Care [facility]) - CM/SW Discharge Note   Patient Details  Name: Timothy Faulkner MRN: [id number] Date of Birth: 01-06-1946  [facility]) CM/SW Contact:  Zenon Mayo, RN Phone Number: 03/03/2019, 10:44 AM   Clinical Narrative:    Patient for possible discharge today, he is active with [facility] for [facility] [place], will resume at discharge, Notified MD to add [facility] to [place] orders.  Soc will begin 24-48 hrs post dc.   Final next level of care: [place] Barriers to Discharge: No Barriers Identified   Patient Goals and CMS Choice Patient states their goals for this hospitalization and ongoing recovery are:: get better CMS Medicare.gov Compare Post Acute Care list provided to:: Patient Choice offered to / list presented to : Patient  Discharge Placement                       Discharge Plan and Services In-house Referral: NA Discharge Planning Services: CM Consult Post Acute Care Choice: Home Health, Resumption of Svcs/PTA Provider          DME Arranged: (NA)         HH Arranged: RN, PT HH Agency: [facility] Date [place] Agency Contacted: 02/24/19 Time [place]: [id number] Representative spoke with at [place]: [place] ([place]) Interventions     Readmission Risk Interventions No flowsheet data found.

## 2019-03-03 NOTE — Progress Notes (Signed)
Pt got discharged to home, discharge instructions provided and patient showed understanding to it, IV taken out,Telemonitor DC,pt left unit in wheelchair with all of the belongings accompanied with a family member (Daughter)  Jezel Basto, RN 

## 2019-03-03 NOTE — Telephone Encounter (Signed)
DOS 03/08/2019; 54627 - AMPUTATION TOE MPJ JOINT 3,4 LEFT FOOT  [employer]HC: Effective Date - 11/10/2018 - -   Individual In-Network (Service Year) Deductible Member's plan does not have a deductible  Out-of-Pocket Out-of-Pocket Maximum has been met  $0.00 remaining  $4,500.00 Plan Amt.  [facility] 0.00 Copayment for professional services.

## 2019-03-03 NOTE — Progress Notes (Signed)
Subjective:  Seen on HD, no c/o  Objective Vital signs in last 24 hours: Vitals:   03/03/19 0452 03/03/19 0800 03/03/19 0912 03/03/19 1056  BP:  (!) 80/38 (!) 82/38 (!) 90/50  Pulse:      Resp:  16 18 18   Temp:    98 F (36.7 C)  TempSrc:    Oral  SpO2:  99% 91% 92%  Weight: 77.6 kg     Height:       Weight change: 0 kg  Intake/Output Summary (Last 24 hours) at 03/03/2019 1157 Last data filed at 03/03/2019 0800 Gross per 24 hour  Intake 294 ml  Output -  Net 294 ml    Dialysis Orders:  [facility]  4h  400/800  79kg  3K/2.5 bath  [facility] TDC   Hep 5000 +2000prn Calcitriol 0.5 mcg PO qHD   Assessment/Plan: 1.  Cellulitis w/critical limb ischemia: w/gangreous toes of L foot and bilat LE ischemia. SP perc revasc to LLE on 7/17 and  RLE on 7/20. To have L toe amp surgery on 7/23.  Toe amp surg postponed, patient is going home today 2.  ESRD - on HD MWF 3.  HypOtension/volume  - BP's soft, on midodrine pre HD. Volume status stable, close to EDW 4.  Anemia of CKD - Hgb 14.2--> 12. No indication for ESA at this time.  5.  Secondary Hyperparathyroidism -  Ca in goal.   phos low. Continue calcitriol and binders - auryxia 3AC TID- -  dec to 2 TID. Also on sensipar 6.  Nutrition - Renal diet with fluid restrictions, vit 7. DMT2 8. Hx PVD s/p stent placement in Dec 2019 for RLE 9. Chronic systolic HF s/p ICD placement 10. Dispo - for [employer] home today    [name], MD 03/03/2019, 11:57 AM        Labs: Basic Metabolic Panel: Recent Labs  Lab 02/25/19 0740 02/26/19 0238 02/28/19 0641 03/02/19 0741  NA 134* 134* 132* 133*  K 3.8 3.7 3.7 3.2*  CL 99 98 96* 96*  CO2 19* 23 22 21*  GLUCOSE 81 146* 107* 92  BUN 45* 22 40* 42*  CREATININE 9.04* 6.21* 9.74* 9.45*  CALCIUM 9.0 8.9 9.1 9.2  PHOS 2.7  --  2.9 3.3   Liver Function Tests: Recent Labs  Lab 02/25/19 0740 02/28/19 0641 03/02/19 0741  ALBUMIN 2.8* 2.5* 2.5*   No results for input(s): LIPASE, AMYLASE in the  last 168 hours. No results for input(s): AMMONIA in the last 168 hours. CBC: Recent Labs  Lab 02/25/19 0740 02/26/19 0238 02/28/19 0641 03/02/19 0740  WBC 9.1 8.4 10.9* 10.1  HGB 12.8* 12.5* 11.6* 11.6*  HCT 40.9 39.3 36.3* 36.3*  MCV 100.7* 98.7 98.4 98.9  PLT 310 286 297 319   Cardiac Enzymes: No results for input(s): CKTOTAL, CKMB, CKMBINDEX, TROPONINI in the last 168 hours. CBG: Recent Labs  Lab 03/01/19 2109 03/02/19 0606 03/02/19 1551 03/02/19 2118 03/03/19 0618  GLUCAP 187* 105* 98 228* 81    Iron Studies: No results for input(s): IRON, TIBC, TRANSFERRIN, FERRITIN in the last 72 hours. Studies/Results: No results found. Medications: Infusions: . sodium chloride    . sodium chloride    . piperacillin-tazobactam (ZOSYN)  IV 3.375 g (03/02/19 2340)  . vancomycin 750 mg (02/28/19 1256)    Scheduled Medications: . aspirin EC  81 mg Oral Daily  . calcitRIOL  0.5 mcg Oral Q M,W,F-HD  . Chlorhexidine Gluconate Cloth  6 each Topical  [id number]  . Chlorhexidine Gluconate Cloth  6 each Topical Q0600  . cinacalcet  30 mg Oral Q M,W,F-HD  . clopidogrel  75 mg Oral Q breakfast  . collagenase  1 application Topical Daily  . feeding supplement (NEPRO CARB STEADY)  237 mL Oral BID BM  . feeding supplement (PRO-STAT SUGAR FREE 64)  30 mL Oral BID  . ferric citrate  420 mg Oral TID WC  . gabapentin  100 mg Oral QHS  . midodrine  10 mg Oral Q M,W,F-HD  . multivitamin  1 tablet Oral QHS  . pantoprazole  40 mg Oral Q0600  . rosuvastatin  10 mg Oral q1800  . sodium chloride flush  3 mL Intravenous Q12H  . sodium chloride flush  3 mL Intravenous Q12H    have reviewed scheduled and prn medications.  Physical Exam: General: NAD Heart: RRR Lungs: mostly clear Abdomen: soft, non tender Extremities: no edema-  Both feet bandaged  Dialysis Access: TDC     03/03/2019,11:57 AM  LOS: 9 days

## 2019-03-03 NOTE — Discharge Instructions (Signed)

## 2019-03-03 NOTE — Discharge Summary (Signed)
Physician Discharge Summary  Timothy Faulkner GUR:[id number] DOB: 1946-06-29  PCP: [name], MD  Admit date: 02/22/2019 Discharge date: 03/03/2019  Recommendations for Outpatient Follow-up:  1. Dr. [name], PCP/Cardiology in 1 week. 2. Dr. Hardie Pulley, Podiatry: Office is already contacted patient/daughter to coordinate left foot surgery for next Tuesday, 03/08/2019. 3. Hemodialysis Center: Continue regular hemodialysis appointments on Mondays, Wednesdays and Fridays.  Home Health: PT & RN Equipment/Devices: None  Discharge Condition: Improved and stable CODE STATUS: Full Diet recommendation: Heart healthy diet.  Discharge Diagnoses:  Principal Problem:   Cellulitis of left foot Active Problems:   Automatic implantable cardioverter-defibrillator in situ   ESRD (end stage renal disease) on dialysis (HCC)   Anemia associated with chronic renal failure   Cardiomyopathy, dilated, nonischemic (HCC)   PAD (peripheral artery disease) (HCC)   Gangrene of toe of left foot ([place])   Hypotension   Brief Summary: 73 year old male, lives with spouse and daughter, ambulates with help of a walker, PMH of ESRD on MWF HD, PAD s/p right peroneal stent and right fifth toe amputation for osteomyelitis, anemia, gout, chronic systolic CHF, AICD, HLD, HTN,?  DM 2, chronic hypotension, presented with several days history of left third and fourth toe darkish discoloration, wound with foul-smelling drainage, worsened in the day or 2 prior to admission, seen by OP podiatry and referred to ED for admission.  Admitted for left lower extremity critical limb ischemia with left third and fourth toe gangrene complicated by cellulitis.  Vascular surgery performed successful angioplasty of left tibioperoneal trunk and posterior tibial artery on 7/17 and right lower extremity tibioperoneal trunk atherectomy on 7/20.    Nephrology consulted for HD needs.   Assessment & Plan:  PAD with LLE critical limb  ischemia/left third and fourth toe gangrene, superficial wound with cellulitis.  X-ray without acute findings.  Empirically started on IV vancomycin and Zosyn (both 7/14 >) per pharmacy which he is getting across HD.  Blood cultures x2: Negative, final report.  Cellulitis has resolved.  No clinical evidence of active infection.  Has completed almost 9 days of IV antibiotics.  As discussed with podiatry, no further antibiotics at discharge until close outpatient follow-up with podiatry for toes amputation next Tuesday 7/28.  Vascular surgery performed successful angioplasty of the left tibioperoneal trunk and posterior tibial artery on 7/17.  Vascular surgery performed right lower extremity tibioperoneal trunk atherectomy for nonhealing right foot ulcer on 7/20.  Continue aspirin and Plavix.  Initial plans were for podiatry to perform left third and fourth toe amputations today 7/23.  However due to unforeseen problems, this had to be rescheduled and as per my communication with [name], this will be done next Tuesday in the outpatient setting.  Patient and family have been updated and agreeable.  S/p right fifth toe amputation, chronic nonhealing right lateral foot wound  Nonhealing ulcer but clean without clinical evidence of infection.  X-ray without features of osteomyelitis.  Continue wound care and outpatient follow-up.  Vascular surgery and podiatry input appreciated.  S/p RLE TP trunk atherectomy 7/20 and hopefully can have better healing of the right foot wound post revascularization.  Stable.  ESRD on MWF HD  Nephrology consulting for HD needs.  Last HD 7/17, 7/20 and 7/22.  Dialyzed via left IJ tunneled HD catheter.  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  Patient follows up with [name], Nephrology.  Stable.  Hyperkalemia/AG metabolic acidosis  Secondary to ESRD  on HD.  Potassium 5.5 on admission.  Resolved.  Monitor CBC & BMP periodically across HD.  Chronic hypotension  Stable on midodrine, continue.    Patient gets midodrine only on dialysis days.   Intermittent hypotension but mostly asymptomatic.  Monitor.  Chronic systolic CHF/ICD/PPM  Compensated.  Volume management across HD.  No recent TTE in system, can consider doing outpatient if not one done recently.Marland Kitchen  Follows with [name], patient's PCP/Cardiology.  I updated [name] on 7/15 regarding patient's admission when he wanted the hospitalist to continue his care.  Diet-controlled DM 2 with renal complications  H8I 69/01/2951: 4.7.  CBGs well controlled on diet alone.  Had asymptomatic hypoglycemia CBGs in the 60s on 7/17 when he was n.p.o. for procedures.  CBGs ordered but were never done yesterday.  Discussed with RN today, 136 this morning.  More importantly may need to monitor CBGs while he is n.p.o.  Removed diabetic diet restrictions.  No further hypoglycemic range CBGs.   Consultants:  Vascular surgery Nephrology  Procedures:  HD on 7/17, 7/20 Angioplasty of the left tibioperoneal trunk and posterior tibial artery on 7/17.   Discharge Instructions  Discharge Instructions    (HEART FAILURE PATIENTS) Call MD:  Anytime you have any of the following symptoms: 1) 3 pound weight gain in 24 hours or 5 pounds in 1 week 2) shortness of breath, with or without a dry hacking cough 3) swelling in the hands, feet or stomach 4) if you have to sleep on extra pillows at night in order to breathe.   Complete by: As directed    Call MD for:  difficulty breathing, headache or visual disturbances   Complete by: As directed    Call MD for:  extreme fatigue   Complete by: As directed    Call MD for:  persistant dizziness or light-headedness   Complete by: As directed    Call MD for:  persistant nausea and vomiting   Complete by: As directed    Call MD for:  redness,  tenderness, or signs of infection (pain, swelling, redness, odor or green/yellow discharge around incision site)   Complete by: As directed    Call MD for:  severe uncontrolled pain   Complete by: As directed    Call MD for:  temperature >100.4   Complete by: As directed    Diet - low sodium heart healthy   Complete by: As directed    Increase activity slowly   Complete by: As directed        Medication List    STOP taking these medications   clindamycin 150 MG capsule Commonly known as: Cleocin   pantoprazole 40 MG tablet Commonly known as: PROTONIX     TAKE these medications   acetaminophen 500 MG tablet Commonly known as: TYLENOL Take 500 mg by mouth every 6 (six) hours as needed for mild pain or headache.   aspirin EC 81 MG tablet Take 81 mg by mouth daily. Notes to patient: 03/04/2019   Auryxia 1 GM 210 MG(Fe) tablet Generic drug: ferric citrate Take 420 mg by mouth 2 (two) times a day. Morning and Evening Notes to patient: This evening 03/03/2019   calcitRIOL 0.5 MCG capsule Commonly known as: ROCALTROL Take 1 capsule (0.5 mcg total) by mouth every Monday, Wednesday, and Friday with hemodialysis. Start taking on: March 04, 2019   clopidogrel 75 MG tablet Commonly known as: PLAVIX Take 1 tablet (  75 mg total) by mouth daily with breakfast. Notes to patient: 03/03/2019   colchicine 0.6 MG tablet Take 0.6 mg by mouth daily as needed (for gout flare ups).   collagenase ointment Commonly known as: Santyl Apply 1 application topically daily. Apply to right foot wound daily cover with dry dressing. What changed: Another medication with the same name was removed. Continue taking this medication, and follow the directions you see here.   feeding supplement (NEPRO CARB STEADY) Liqd Take 237 mLs by mouth 2 (two) times daily between meals.   feeding supplement (PRO-STAT SUGAR FREE 64) Liqd Take 30 mLs by mouth 2 (two) times daily.   gabapentin 100 MG capsule  Commonly known as: NEURONTIN TAKE 1 CAPSULE BY MOUTH AT BEDTIME. Notes to patient: 03/03/2019   lidocaine-prilocaine cream Commonly known as: EMLA Apply 1 application topically See admin instructions. Apply small amount to access site 1 to 2 hours before dialysis.   midodrine 10 MG tablet Commonly known as: PROAMATINE Take 10 mg by mouth every Monday, Wednesday, and Friday with hemodialysis.   multivitamin Tabs tablet Take 1 tablet by mouth at bedtime. Notes to patient: 03/03/2019   oxyCODONE 5 MG immediate release tablet Commonly known as: Roxicodone Take 1 tablet (5 mg total) by mouth every 8 (eight) hours as needed for severe pain.   rosuvastatin 10 MG tablet Commonly known as: CRESTOR Take 1 tablet (10 mg total) by mouth daily at 6 PM.   Sensipar 30 MG tablet Generic drug: cinacalcet Take 30 mg by mouth every Monday, Wednesday, and Friday with hemodialysis.      [place] [place] Follow up.   Specialty: Home Health Services Why: [facility], HHPT Contact information: [place] [address] [phone]        [name], MD. Schedule an appointment as soon as possible for a visit in 1 week(s).   Specialty: Cardiology Contact information: [place] [address] [phone]        [name], DPM Follow up on 03/08/2019.   Specialty: Podiatry Why: Follow-up with their office regarding left foot surgery. Contact information: [address] [place] [address] [phone]        Hemodialysis Center Follow up on 03/04/2019.   Why: Continue regular hemodialysis appointments on Mondays, Wednesdays and Fridays.         No Known Allergies    Procedures/Studies: Dg Foot Complete Left  Result Date: 02/22/2019 CLINICAL DATA:  Right foot wound EXAM: LEFT FOOT - COMPLETE 3+ VIEW COMPARISON:  07/13/2018 FINDINGS: Vascular calcifications. Small plantar  calcaneal spur. No soft tissue emphysema. No acute fracture. Mild degenerative change at the first MTP joint. Marginal erosive changes at the head of the first proximal phalanx and the head of the first metatarsal presumably related to history of gout. IMPRESSION: 1. No acute osseous abnormality. 2. Findings felt consistent with history of gout at the first digit. Electronically Signed   By: Donavan Foil M.D.   On: 02/22/2019 20:57   Dg Foot Complete Left  Result Date: 02/22/2019 Please see detailed radiograph report in office note.  Dg Foot Complete Right  Result Date: 02/22/2019 CLINICAL DATA:  Right foot wound EXAM: RIGHT FOOT COMPLETE - 3+ VIEW COMPARISON:  01/13/2019, 09/07/2018 FINDINGS: Chronic fracture deformity of the first proximal phalanx. Ununited fracture deformity proximal shaft fourth metatarsal with possible increased bone resorption at the fracture cleft. Prior amputation  fifth digit at the base of fifth metatarsal without definitive acute bony destructive change. No soft tissue emphysema. IMPRESSION: 1. Prior amputation of the fifth digit at the level of the base of fifth metatarsal without definitive acute osseous changes of the remnant digit 2. Ununited fracture proximal shaft fourth metatarsal, seen on prior radiograph from June 2020. There may be increased osteolysis at the fracture cleft Electronically Signed   By: Donavan Foil M.D.   On: 02/22/2019 21:01   Vas [place] Burnard Bunting With/wo Tbi  Result Date: 02/24/2019 LOWER EXTREMITY DOPPLER STUDY Indications: Ulceration, and gangrene. High Risk Factors: Hypertension, hyperlipidemia, Diabetes.  Comparison Study: previous 07/14/18 Performing Technologist: Abram Sander RVS  Examination Guidelines: A complete evaluation includes at minimum, Doppler waveform signals and systolic blood pressure reading at the level of bilateral brachial, anterior tibial, and posterior tibial arteries, when vessel segments are accessible. Bilateral testing is  considered an integral part of a complete examination. Photoelectric Plethysmograph (PPG) waveforms and toe systolic pressure readings are included as required and additional duplex testing as needed. Limited examinations for reoccurring indications may be performed as noted.  ABI Findings: +--------+------------------+-----+-------------------+-----------+ Right   Rt Pressure (mmHg)IndexWaveform           Comment     +--------+------------------+-----+-------------------+-----------+ [id number]                    triphasic                      +--------+------------------+-----+-------------------+-----------+ PTA     73                0.51 dampened monophasic            +--------+------------------+-----+-------------------+-----------+ DP                                                not audible +--------+------------------+-----+-------------------+-----------+ +--------+------------------+-----+-------------------+-------------------+ Left    Lt Pressure (mmHg)IndexWaveform           Comment             +--------+------------------+-----+-------------------+-------------------+ Brachial                       triphasic          unable to obtain bp +--------+------------------+-----+-------------------+-------------------+ PTA     48                0.34 dampened monophasic                    +--------+------------------+-----+-------------------+-------------------+ DP                                                not audible         +--------+------------------+-----+-------------------+-------------------+ +-------+-----------+-----------+------------+------------+ ABI/TBIToday's ABIToday's TBIPrevious ABIPrevious TBI +-------+-----------+-----------+------------+------------+ Right  0.51                                           +-------+-----------+-----------+------------+------------+ Left   0.34                                            +-------+-----------+-----------+------------+------------+  Summary: Right: Resting right ankle-brachial index indicates moderate right lower extremity arterial disease. Left: Resting left ankle-brachial index indicates severe left lower extremity arterial disease.  *See table(s) above for measurements and observations.  Electronically signed by Harold Barban MD on 02/24/2019 at 1:12:58 PM.   Final       Subjective: Patient interviewed and examined today with RN at bedside.  No complaints reported.  No pain reported.  As per RN, no acute issues noted.  Denies chest pain, dyspnea, dizziness, lightheadedness or drainage from his foot wounds.  Discharge Exam:  Vitals:   03/03/19 0452 03/03/19 0800 03/03/19 0912 03/03/19 1056  BP:  (!) 80/38 (!) 82/38 (!) 90/50  Pulse:      Resp:  16 18 18   Temp:    98 F (36.7 C)  TempSrc:    Oral  SpO2:  99% 91% 92%  Weight: 77.6 kg     Height:        General exam: Pleasant elderly male, moderately built and nourished lying comfortably supine in bed without distress. Respiratory system: Clear to auscultation.  No increased work of breathing.  Left IJ tunneled HD catheter without acute findings.  Cardiovascular system: S1 and S2 heard, RRR.  No JVD, murmurs or pedal edema.    Gastrointestinal system: Abdomen is nondistended, soft and nontender.  No organomegaly or masses appreciated.  Normal bowel sounds heard.  Central nervous system: Alert and oriented. No focal neurological deficits. Extremities: Symmetric 5 x 5 power. Skin: Left third and fourth toe with dry gangrene, superficial wound on the medial aspect of the left fourth toe without drainage. No bleeding. Right foot s/p fifth toe amputation with healed site.  Right lateral forefoot with large deep but clean wound without acute findings of infection.  Please see pictures below from admission.  As per vascular follow-up, "R peroneal and PT soft by doppler; L AT and PT soft by doppler". Psychiatry:  Judgement and insight appear normal. Mood & affect appropriate.           The results of significant diagnostics from this hospitalization (including imaging, microbiology, ancillary and laboratory) are listed below for reference.     Microbiology: Recent Results (from the past 240 hour(s))  Blood culture (routine x 2)     Status: None   Collection Time: 02/22/19  7:45 PM   Specimen: BLOOD  Result Value Ref Range Status   Specimen Description BLOOD BLOOD LEFT WRIST  Final   Special Requests   Final    BOTTLES DRAWN AEROBIC AND ANAEROBIC Blood Culture adequate volume   Culture   Final    NO GROWTH 5 DAYS Performed at [facility], [address], [place], [place] [address]    Report Status 02/27/2019 FINAL  Final  Blood culture (routine x 2)     Status: None   Collection Time: 02/22/19  8:20 PM   Specimen: BLOOD RIGHT HAND  Result Value Ref Range Status   Specimen Description BLOOD RIGHT HAND  Final   Special Requests   Final    BOTTLES DRAWN AEROBIC AND ANAEROBIC Blood Culture results may not be optimal due to an inadequate volume of blood received in culture bottles   Culture   Final    NO GROWTH 5 DAYS Performed at [facility], [place] [address] [place], [place] [address]    Report Status 02/27/2019 FINAL  Final  SARS Coronavirus 2 (CEPHEID - Performed in [facility] hospital lab), [facility]  Status: None   Collection Time: 02/22/19  9:29 PM   Specimen: Nasopharyngeal Swab  Result Value Ref Range Status   SARS Coronavirus 2 NEGATIVE NEGATIVE Final    Comment: (NOTE) If result is NEGATIVE SARS-CoV-2 target nucleic acids are NOT DETECTED. The SARS-CoV-2 RNA is generally detectable in upper and lower  respiratory specimens during the acute phase of infection. The lowest  concentration of SARS-CoV-2 viral copies this assay can detect is 250  copies / mL. A negative result does not preclude SARS-CoV-2 infection  and should not be used as the sole basis  for treatment or other  patient management decisions.  A negative result may occur with  improper specimen collection / handling, submission of specimen other  than nasopharyngeal swab, presence of viral mutation(s) within the  areas targeted by this assay, and inadequate number of viral copies  (<250 copies / mL). A negative result must be combined with clinical  observations, patient history, and epidemiological information. If result is POSITIVE SARS-CoV-2 target nucleic acids are DETECTED. The SARS-CoV-2 RNA is generally detectable in upper and lower  respiratory specimens dur ing the acute phase of infection.  Positive  results are indicative of active infection with SARS-CoV-2.  Clinical  correlation with patient history and other diagnostic information is  necessary to determine patient infection status.  Positive results do  not rule out bacterial infection or co-infection with other viruses. If result is PRESUMPTIVE POSTIVE SARS-CoV-2 nucleic acids MAY BE PRESENT.   A presumptive positive result was obtained on the submitted specimen  and confirmed on repeat testing.  While 2019 novel coronavirus  (SARS-CoV-2) nucleic acids may be present in the submitted sample  additional confirmatory testing may be necessary for epidemiological  and / or clinical management purposes  to differentiate between  SARS-CoV-2 and other Sarbecovirus currently known to infect humans.  If clinically indicated additional testing with an alternate test  methodology [phone]) is advised. The SARS-CoV-2 RNA is generally  detectable in upper and lower respiratory sp ecimens during the acute  phase of infection. The expected result is Negative. Fact Sheet for Patients:  StrictlyIdeas.no Fact Sheet for Healthcare Providers: BankingDealers.co.za This test is not yet approved or cleared by the [place] FDA and has been authorized for detection and/or  diagnosis of SARS-CoV-2 by FDA under an Emergency Use Authorization (EUA).  This EUA will remain in effect (meaning this test can be used) for the duration of the COVID-19 declaration under Section 564(b)(1) of the Act, 21 U.S.C. section 360bbb-3(b)(1), unless the authorization is terminated or revoked sooner. Performed at [facility], [place] [address] [place], [place] [address]   MRSA PCR Screening     Status: None   Collection Time: 02/23/19  2:16 AM   Specimen: Nasal Mucosa; Nasopharyngeal  Result Value Ref Range Status   MRSA by PCR NEGATIVE NEGATIVE Final    Comment:        The GeneXpert MRSA Assay (FDA approved for NASAL specimens only), is one component of a comprehensive MRSA colonization surveillance program. It is not intended to diagnose MRSA infection nor to guide or monitor treatment for MRSA infections. Performed at [facility], [place] [address] [place], [place] [address]      Labs: CBC: Recent Labs  Lab 02/25/19 0740 02/26/19 0238 02/28/19 0641 03/02/19 0740  WBC 9.1 8.4 10.9* 10.1  HGB 12.8* 12.5* 11.6* 11.6*  HCT 40.9 39.3 36.3* 36.3*  MCV 100.7* 98.7 98.4 98.9  PLT 310  286 297 300   Basic Metabolic Panel: Recent Labs  Lab 02/25/19 0740 02/26/19 0238 02/28/19 0641 03/02/19 0741  NA 134* 134* 132* 133*  K 3.8 3.7 3.7 3.2*  CL 99 98 96* 96*  CO2 19* 23 22 21*  GLUCOSE 81 146* 107* 92  BUN 45* 22 40* 42*  CREATININE 9.04* 6.21* 9.74* 9.45*  CALCIUM 9.0 8.9 9.1 9.2  PHOS 2.7  --  2.9 3.3   Liver Function Tests: Recent Labs  Lab 02/25/19 0740 02/28/19 0641 03/02/19 0741  ALBUMIN 2.8* 2.5* 2.5*   CBG: Recent Labs  Lab 03/01/19 2109 03/02/19 0606 03/02/19 1551 03/02/19 2118 03/03/19 0618  GLUCAP 187* 105* 98 228* 81   I had discussed in detail with patient's daughter on 7/22, updated care and answered questions including discharge plans today.  Time coordinating discharge: 45 minutes  SIGNED:  Vernell Leep, MD,  FACP, [facility]. [facility]  To contact the attending provider between 7A-7P or the covering provider during after hours 7P-7A, please log into the web site [email or web address] and access using universal [place] password for that web site. If you do not have the password, please call the hospital operator.

## 2019-03-03 NOTE — Plan of Care (Signed)
  Problem: Health Behavior/Discharge Planning: Goal: Ability to manage health-related needs will improve 03/03/2019 1101 by Neil Crouch, RN Outcome: Completed/Met 03/03/2019 0928 by Neil Crouch, RN Outcome: Progressing   Problem: Clinical Measurements: Goal: Ability to maintain clinical measurements within normal limits will improve 03/03/2019 1101 by Neil Crouch, RN Outcome: Completed/Met 03/03/2019 0928 by Neil Crouch, RN Outcome: Progressing Goal: Will remain free from infection 03/03/2019 1101 by Neil Crouch, RN Outcome: Completed/Met 03/03/2019 0928 by Neil Crouch, RN Outcome: Progressing Goal: Diagnostic test results will improve 03/03/2019 1101 by Neil Crouch, RN Outcome: Completed/Met 03/03/2019 0928 by Neil Crouch, RN Outcome: Progressing Goal: Respiratory complications will improve 03/03/2019 1101 by Neil Crouch, RN Outcome: Completed/Met 03/03/2019 0928 by Neil Crouch, RN Outcome: Progressing Goal: Cardiovascular complication will be avoided 03/03/2019 1101 by Neil Crouch, RN Outcome: Completed/Met 03/03/2019 0928 by Neil Crouch, RN Outcome: Progressing   Problem: Activity: Goal: Risk for activity intolerance will decrease 03/03/2019 1101 by Neil Crouch, RN Outcome: Completed/Met 03/03/2019 0928 by Neil Crouch, RN Outcome: Progressing   Problem: Nutrition: Goal: Adequate nutrition will be maintained 03/03/2019 1101 by Neil Crouch, RN Outcome: Completed/Met 03/03/2019 0928 by Neil Crouch, RN Outcome: Progressing   Problem: Coping: Goal: Level of anxiety will decrease 03/03/2019 1101 by Neil Crouch, RN Outcome: Completed/Met 03/03/2019 0928 by Neil Crouch, RN Outcome: Progressing   Problem: Elimination: Goal: Will not experience complications related to bowel motility 03/03/2019 1101 by Neil Crouch, RN Outcome: Completed/Met 03/03/2019 0928 by Neil Crouch, RN Outcome: Progressing Goal: Will not experience complications related to urinary  retention 03/03/2019 1101 by Neil Crouch, RN Outcome: Completed/Met 03/03/2019 0928 by Neil Crouch, RN Outcome: Progressing   Problem: Pain Managment: Goal: General experience of comfort will improve 03/03/2019 1101 by Neil Crouch, RN Outcome: Completed/Met 03/03/2019 0928 by Neil Crouch, RN Outcome: Progressing   Problem: Safety: Goal: Ability to remain free from injury will improve 03/03/2019 1101 by Neil Crouch, RN Outcome: Completed/Met 03/03/2019 0928 by Neil Crouch, RN Outcome: Progressing   Problem: Skin Integrity: Goal: Risk for impaired skin integrity will decrease 03/03/2019 1101 by Neil Crouch, RN Outcome: Completed/Met 03/03/2019 0928 by Neil Crouch, RN Outcome: Progressing   Problem: Acute Rehab PT Goals(only PT should resolve) Goal: Pt Will Go Supine/Side To Sit Outcome: Completed/Met Goal: Patient Will Transfer Sit To/From Stand Outcome: Completed/Met Goal: Pt Will Ambulate Outcome: Completed/Met

## 2019-03-03 NOTE — Plan of Care (Signed)

## 2019-03-04 ENCOUNTER — Ambulatory Visit (INDEPENDENT_AMBULATORY_CARE_PROVIDER_SITE_OTHER): Payer: Medicare Other | Admitting: Podiatry

## 2019-03-04 ENCOUNTER — Other Ambulatory Visit: Payer: Self-pay | Admitting: *Deleted

## 2019-03-04 ENCOUNTER — Telehealth: Payer: Self-pay | Admitting: *Deleted

## 2019-03-04 ENCOUNTER — Other Ambulatory Visit: Payer: Self-pay

## 2019-03-04 DIAGNOSIS — E08621 Diabetes mellitus due to underlying condition with foot ulcer: Secondary | ICD-10-CM

## 2019-03-04 DIAGNOSIS — D689 Coagulation defect, unspecified: Secondary | ICD-10-CM | POA: Diagnosis not present

## 2019-03-04 DIAGNOSIS — I96 Gangrene, not elsewhere classified: Secondary | ICD-10-CM | POA: Diagnosis not present

## 2019-03-04 DIAGNOSIS — L97511 Non-pressure chronic ulcer of other part of right foot limited to breakdown of skin: Secondary | ICD-10-CM

## 2019-03-04 DIAGNOSIS — I739 Peripheral vascular disease, unspecified: Secondary | ICD-10-CM | POA: Diagnosis not present

## 2019-03-04 DIAGNOSIS — Z01812 Encounter for preprocedural laboratory examination: Secondary | ICD-10-CM

## 2019-03-04 NOTE — Telephone Encounter (Signed)
I am calling to let you know that someone from pre-surgery testing will give your dad a call to schedule his Covid test.  You will take him to [facility] drive thru.  "Will that be today?"  Most likely, it will be today.  "Okay, thanks for calling."

## 2019-03-06 NOTE — Progress Notes (Signed)
Subjective:  Patient ID: Timothy Faulkner, male    DOB: [birth year],  MRN: [id number]  Chief Complaint  Patient presents with  . Wound Check    Pt states right lateral wound is about the same and left 3rd and 4th digits are the same. Denies fever/nausea/vomiting/chills.    73 y.o. male presents for wound care.  Here for follow-up after being discharged from the hospital.  Denies interval changes.  Understands plan for the surgery on Tuesday  Review of Systems: Negative except as noted in the HPI. Denies N/V/F/Ch.  Past Medical History:  Diagnosis Date  . Anemia   . Arthritis    Gout  . Automatic implantable cardioverter-defibrillator in situ    [employer]  . CHF   . ESRD on dialysis [facility])    [name] 03/11/2013 (03/11/2013) dialysis M/W/F  . GERD (gastroesophageal reflux disease)   . Gout    "once a year"  . Heart murmur   . HYPERCHOLESTEROLEMIA, MIXED   . HYPERTENSION   . Macular degeneration   . Osteomyelitis ([place])    right foot  . 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 Pacemaker   . Peripheral arterial disease (HCC)    left fifth toe ulcer, healing  . Pneumonia   . Shortness of breath   . 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 Wears dentures   . Wears glasses     Current Outpatient Medications:  .  acetaminophen (TYLENOL) 500 MG tablet, Take 500 mg by mouth every 6 (six) hours as needed for mild pain or headache., Disp: , Rfl:  .  Amino Acids-Protein Hydrolys (FEEDING SUPPLEMENT, PRO-STAT SUGAR FREE 64,) LIQD, Take 30 mLs by mouth 2 (two) times daily., Disp: 887 mL, Rfl: 0 .  aspirin EC 81 MG tablet, Take 81 mg by mouth daily., Disp: , Rfl:  .  AURYXIA 1 GM 210 MG(Fe) tablet, Take 420 mg by mouth 2 (two) times a day. Morning and Evening, Disp: , Rfl:  .  calcitRIOL (ROCALTROL) 0.5 MCG capsule, Take 1 capsule (0.5 mcg total) by mouth every Monday, Wednesday, and Friday with hemodialysis., Disp: , Rfl:  .  clopidogrel  (PLAVIX) 75 MG tablet, Take 1 tablet (75 mg total) by mouth daily with breakfast., Disp: 30 tablet, Rfl: 0 .  colchicine 0.6 MG tablet, Take 0.6 mg by mouth daily as needed (for gout flare ups). , Disp: , Rfl: 1 .  collagenase (SANTYL) ointment, Apply 1 application topically daily. Apply to right foot wound daily cover with dry dressing., Disp: 15 g, Rfl: 5 .  gabapentin (NEURONTIN) 100 MG capsule, TAKE 1 CAPSULE BY MOUTH AT BEDTIME. (Patient taking differently: Take 100 mg by mouth at bedtime. ), Disp: 90 capsule, Rfl: 0 .  lidocaine-prilocaine (EMLA) cream, Apply 1 application topically See admin instructions. Apply small amount to access site 1 to 2 hours before dialysis., Disp: , Rfl: 12 .  midodrine (PROAMATINE) 10 MG tablet, Take 10 mg by mouth every Monday, Wednesday, and Friday with hemodialysis. , Disp: , Rfl: 6 .  multivitamin (RENA-VIT) TABS tablet, Take 1 tablet by mouth at bedtime., Disp: 30 tablet, Rfl: 0 .  Nutritional Supplements (FEEDING SUPPLEMENT, NEPRO CARB STEADY,) LIQD, Take 237 mLs by mouth 2 (two) times daily between meals., Disp: 237 mL, Rfl: 12 .  oxyCODONE (ROXICODONE) 5 MG immediate release tablet, Take 1 tablet (5 mg total) by mouth every 8 (eight) hours as needed for severe  pain., Disp: 12 tablet, Rfl: 0 .  rosuvastatin (CRESTOR) 10 MG tablet, Take 1 tablet (10 mg total) by mouth daily at 6 PM., Disp: 30 tablet, Rfl: 0 .  SENSIPAR 30 MG tablet, Take 30 mg by mouth every Monday, Wednesday, and Friday with hemodialysis. , Disp: , Rfl:   Social History   Tobacco Use  Smoking Status Former Smoker  . Packs/day: 0.75  . Years: 30.00  . Pack years: 22.50  . Types: Cigarettes  . Quit date: 11/25/1992  . Years since quitting: 26.2  Smokeless Tobacco Never Used    No Known Allergies Objective:  There were no vitals filed for this visit. There is no height or weight on file to calculate BMI. Constitutional Well developed. Well nourished.  Vascular Dorsalis pedis  pulses palpable bilaterally. Posterior tibial pulses palpable bilaterally. Capillary refill normal to all digits.  No cyanosis or clubbing noted. Pedal hair growth normal.  Neurologic Normal speech. Oriented to person, place, and time. Protective sensation absent  Dermatologic  right foot wound measuring 2.5 x 3 with granular base no warmth erythema signs of acute infection Dry gangrene of the left fourth and third toes without signs of acute infection no ascending erythema no excessive warmth  Orthopedic: No pain to palpation either foot.   Radiographs: None Assessment:   1. Gangrene of toe of left foot ([place])   2. Diabetic ulcer of other part of right foot associated with diabetes mellitus due to underlying condition, limited to breakdown of skin ([place])   3. PAD (peripheral artery disease) (HCC)   4. Coagulation defect ([place])    Plan:  Patient was evaluated and treated and all questions answered.  R Foot Wound -Planning for debridement and skin graft substitute application -Dressed with collagen gel today  Gangrene Left 3rd/4th Toes -Pending amputation Tuesday -Discussed getting Cobra tested again as required.  Plan for surgery this week  No follow-ups on file.

## 2019-03-06 NOTE — H&P (View-Only) (Signed)
Subjective:  Patient ID: Timothy Faulkner, male    DOB: 1946-01-31,  MRN: [id number]  Chief Complaint  Patient presents with  . Wound Check    Pt states right lateral wound is about the same and left 3rd and 4th digits are the same. Denies fever/nausea/vomiting/chills.    73 y.o. male presents for wound care.  Here for follow-up after being discharged from the hospital.  Denies interval changes.  Understands plan for the surgery on Tuesday  Review of Systems: Negative except as noted in the HPI. Denies N/V/F/Ch.  Past Medical History:  Diagnosis Date  . Anemia   . Arthritis    Gout  . Automatic implantable cardioverter-defibrillator in situ    [employer]  . CHF   . ESRD on dialysis [facility])    [name] 03/11/2013 (03/11/2013) dialysis M/W/F  . GERD (gastroesophageal reflux disease)   . Gout    "once a year"  . Heart murmur   . HYPERCHOLESTEROLEMIA, MIXED   . HYPERTENSION   . Macular degeneration   . Osteomyelitis ([place])    right foot  . 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 Pacemaker   . Peripheral arterial disease (HCC)    left fifth toe ulcer, healing  . Pneumonia   . Shortness of breath   . 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 Wears dentures   . Wears glasses     Current Outpatient Medications:  .  acetaminophen (TYLENOL) 500 MG tablet, Take 500 mg by mouth every 6 (six) hours as needed for mild pain or headache., Disp: , Rfl:  .  Amino Acids-Protein Hydrolys (FEEDING SUPPLEMENT, PRO-STAT SUGAR FREE 64,) LIQD, Take 30 mLs by mouth 2 (two) times daily., Disp: 887 mL, Rfl: 0 .  aspirin EC 81 MG tablet, Take 81 mg by mouth daily., Disp: , Rfl:  .  AURYXIA 1 GM 210 MG(Fe) tablet, Take 420 mg by mouth 2 (two) times a day. Morning and Evening, Disp: , Rfl:  .  calcitRIOL (ROCALTROL) 0.5 MCG capsule, Take 1 capsule (0.5 mcg total) by mouth every Monday, Wednesday, and Friday with hemodialysis., Disp: , Rfl:  .  clopidogrel  (PLAVIX) 75 MG tablet, Take 1 tablet (75 mg total) by mouth daily with breakfast., Disp: 30 tablet, Rfl: 0 .  colchicine 0.6 MG tablet, Take 0.6 mg by mouth daily as needed (for gout flare ups). , Disp: , Rfl: 1 .  collagenase (SANTYL) ointment, Apply 1 application topically daily. Apply to right foot wound daily cover with dry dressing., Disp: 15 g, Rfl: 5 .  gabapentin (NEURONTIN) 100 MG capsule, TAKE 1 CAPSULE BY MOUTH AT BEDTIME. (Patient taking differently: Take 100 mg by mouth at bedtime. ), Disp: 90 capsule, Rfl: 0 .  lidocaine-prilocaine (EMLA) cream, Apply 1 application topically See admin instructions. Apply small amount to access site 1 to 2 hours before dialysis., Disp: , Rfl: 12 .  midodrine (PROAMATINE) 10 MG tablet, Take 10 mg by mouth every Monday, Wednesday, and Friday with hemodialysis. , Disp: , Rfl: 6 .  multivitamin (RENA-VIT) TABS tablet, Take 1 tablet by mouth at bedtime., Disp: 30 tablet, Rfl: 0 .  Nutritional Supplements (FEEDING SUPPLEMENT, NEPRO CARB STEADY,) LIQD, Take 237 mLs by mouth 2 (two) times daily between meals., Disp: 237 mL, Rfl: 12 .  oxyCODONE (ROXICODONE) 5 MG immediate release tablet, Take 1 tablet (5 mg total) by mouth every 8 (eight) hours as needed for severe  pain., Disp: 12 tablet, Rfl: 0 .  rosuvastatin (CRESTOR) 10 MG tablet, Take 1 tablet (10 mg total) by mouth daily at 6 PM., Disp: 30 tablet, Rfl: 0 .  SENSIPAR 30 MG tablet, Take 30 mg by mouth every Monday, Wednesday, and Friday with hemodialysis. , Disp: , Rfl:   Social History   Tobacco Use  Smoking Status Former Smoker  . Packs/day: 0.75  . Years: 30.00  . Pack years: 22.50  . Types: Cigarettes  . Quit date: 11/25/1992  . Years since quitting: 26.2  Smokeless Tobacco Never Used    No Known Allergies Objective:  There were no vitals filed for this visit. There is no height or weight on file to calculate BMI. Constitutional Well developed. Well nourished.  Vascular Dorsalis pedis  pulses palpable bilaterally. Posterior tibial pulses palpable bilaterally. Capillary refill normal to all digits.  No cyanosis or clubbing noted. Pedal hair growth normal.  Neurologic Normal speech. Oriented to person, place, and time. Protective sensation absent  Dermatologic  right foot wound measuring 2.5 x 3 with granular base no warmth erythema signs of acute infection Dry gangrene of the left fourth and third toes without signs of acute infection no ascending erythema no excessive warmth  Orthopedic: No pain to palpation either foot.   Radiographs: None Assessment:   1. Gangrene of toe of left foot ([place])   2. Diabetic ulcer of other part of right foot associated with diabetes mellitus due to underlying condition, limited to breakdown of skin ([place])   3. PAD (peripheral artery disease) (HCC)   4. Coagulation defect ([place])    Plan:  Patient was evaluated and treated and all questions answered.  R Foot Wound -Planning for debridement and skin graft substitute application -Dressed with collagen gel today  Gangrene Left 3rd/4th Toes -Pending amputation Tuesday -Discussed getting Cobra tested again as required.  Plan for surgery this week  No follow-ups on file.

## 2019-03-07 ENCOUNTER — Other Ambulatory Visit (HOSPITAL_COMMUNITY)
Admission: RE | Admit: 2019-03-07 | Discharge: 2019-03-07 | Disposition: A | Discharge status: Home / Self Care | Payer: Medicare Other | Source: Ambulatory Visit | Attending: Podiatry | Admitting: Podiatry

## 2019-03-07 ENCOUNTER — Telehealth: Payer: Self-pay | Admitting: *Deleted

## 2019-03-07 ENCOUNTER — Encounter (HOSPITAL_COMMUNITY): Payer: Self-pay | Admitting: *Deleted

## 2019-03-07 ENCOUNTER — Other Ambulatory Visit: Payer: Self-pay

## 2019-03-07 DIAGNOSIS — Z20828 Contact with and (suspected) exposure to other viral communicable diseases: Secondary | ICD-10-CM | POA: Diagnosis present

## 2019-03-07 LAB — SARS CORONAVIRUS 2 (TAT 6-24 HRS): SARS Coronavirus 2: NEGATIVE

## 2019-03-07 NOTE — Anesthesia Preprocedure Evaluation (Addendum)
Anesthesia Evaluation  Patient identified by MRN, date of birth, ID band Patient awake    Reviewed: Allergy & Precautions, H&P , NPO status , Patient's Chart, lab work & pertinent test results  Airway Mallampati: II  TM Distance: >3 FB Neck ROM: Full    Dental no notable dental hx. (+) Edentulous Upper, Partial Lower, Dental Advisory Given   Pulmonary sleep apnea , former smoker,    Pulmonary exam normal breath sounds clear to auscultation       Cardiovascular Exercise Tolerance: Good hypertension, + Peripheral Vascular Disease and +CHF  + pacemaker + Cardiac Defibrillator  Rhythm:Regular Rate:Normal     Neuro/Psych negative neurological ROS  negative psych ROS   GI/Hepatic negative GI ROS, Neg liver ROS,   Endo/Other  diabetes  Renal/GU ESRF and DialysisRenal disease  negative genitourinary   Musculoskeletal  (+) Arthritis , Osteoarthritis,    Abdominal   Peds  Hematology  (+) Blood dyscrasia, anemia ,   Anesthesia Other Findings   Reproductive/Obstetrics negative OB ROS                           Anesthesia Physical Anesthesia Plan  ASA: IV  Anesthesia Plan: MAC   Post-op Pain Management:    Induction: Intravenous  PONV Risk Score and Plan: 1 and Propofol infusion  Airway Management Planned: Simple Face Mask  Additional Equipment:   Intra-op Plan:   Post-operative Plan:   Informed Consent: I have reviewed the patients History and Physical, chart, labs and discussed the procedure including the risks, benefits and alternatives for the proposed anesthesia with the patient or authorized representative who has indicated his/her understanding and acceptance.     Dental advisory given  Plan Discussed with: CRNA  Anesthesia Plan Comments: (Admitted 7/14-7/23/2020 for PAD with LLE critical limb ischemia/left third and fourth toe gangrene, superficial wound with cellulitis.  Underwent angioplasty of the left tibioperoneal trunk and posterior tibial artery on 7/17, right lower extremity tibioperoneal trunk atherectomy for nonhealing right foot ulcer on 7/20.  Follows with 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 He was seen for post discharge followup on 03/04/19 and discussed upcoming surgery. Per note " patient is high risk for any surgical procedure in view of severely depressed LV systolic function and valvular heart disease."  TTE 05/27/17: Left ventricle: LV size is mildly enlarged.  LV systolic function is severely depressed.  Mild concentric LV hypertrophy.  Global hypokinesis.  LV function is severely depressed.  Estimated EF is <20%. Right ventricle: Pacemaker wires and RV was visualized. Ventricular septum: IVS is normal. Left atrium: LA size is normal.  No abnormalities visualized in the LA. Right atrium: Pacemaker wire and RA was visualized. Atrial septum: Atrial septum is normal. Pericardial effusion: Pericardium is normal. Mitral valve: Mild mitral regurgitation.  No MV abnormalities noted. Pulmonic valve: Trace of pulmonic regurgitation.  No PV abnormalities noted. Tricuspid valve: Moderate tricuspid regurgitation no TV abnormalities noted. Aortic valve: No AV abnormalities noted.)     Anesthesia Quick Evaluation

## 2019-03-07 NOTE — Telephone Encounter (Signed)
Orders in 

## 2019-03-07 NOTE — Telephone Encounter (Signed)
"  I'm calling about Timothy Faulkner.  Could you get [name] to put the orders in?  Also, I see the patient takes Plavix and Aspirin.  Was he told to stop taking those?"  [name] does not want him to stop taking those medications.  "So he's okay to take them that morning of surgery?"  Yes, that is correct.

## 2019-03-07 NOTE — Progress Notes (Signed)
Pt denies having SOB and chest pain. Pt is under the care of [name] and Dr.Taylor (EP), Cardiology. Peri-op Prescription for ICD faxed to Dr. Lovena Le; awaiting response. Delydia, Surgical Coordinator, confirmed that pt is to continue both Aspirin and Plavix and take both the morning of surgery. Pt made aware to stop taking  vitamins, fish oil and herbal medications. Do not take any NSAIDs ie: Ibuprofen, Advil, Naproxen (Aleve), Motrin, BC and Goody Powder.   Pt stated that he has not been treated for diabetes in  years and he does not check his blood glucose.  Pt denies that he and family members tested positive for COVID-19 ( pt tested 03/07/19; pt reminded to quarantine ).   Coronavirus Screening  Pt denies that he and family members experienced the following symptoms:  Cough yes/no: No Fever (>100.17F)  yes/no: No Runny nose yes/no: No Sore throat yes/no: No Difficulty breathing/shortness of breath  yes/no: No  Have you or a family member traveled in the last 14 days and where? yes/no: No  Pt reminded that hospital visitation restrictions are in effect and the importance of the restrictions.   Pt  verbalized understanding of all pre-op instructions.  PA,  Anesthesiology, asked to review pt history and cardiology note on chart; see PA note in Epic.

## 2019-03-08 ENCOUNTER — Ambulatory Visit (HOSPITAL_COMMUNITY): Payer: Medicare Other | Admitting: Physician Assistant

## 2019-03-08 ENCOUNTER — Ambulatory Visit (HOSPITAL_COMMUNITY)
Admission: RE | Admit: 2019-03-08 | Discharge: 2019-03-08 | Disposition: A | Discharge status: Home / Self Care | Payer: Medicare Other | Attending: Podiatry | Admitting: Podiatry

## 2019-03-08 ENCOUNTER — Encounter (HOSPITAL_COMMUNITY)
Admission: RE | Disposition: A | Discharge status: Home / Self Care | Payer: Self-pay | Source: Home / Self Care | Attending: Podiatry

## 2019-03-08 ENCOUNTER — Encounter (HOSPITAL_COMMUNITY): Payer: Self-pay | Admitting: Certified Registered"

## 2019-03-08 DIAGNOSIS — E11621 Type 2 diabetes mellitus with foot ulcer: Secondary | ICD-10-CM | POA: Insufficient documentation

## 2019-03-08 DIAGNOSIS — H3530 Unspecified macular degeneration: Secondary | ICD-10-CM | POA: Insufficient documentation

## 2019-03-08 DIAGNOSIS — Z79899 Other long term (current) drug therapy: Secondary | ICD-10-CM | POA: Insufficient documentation

## 2019-03-08 DIAGNOSIS — L97529 Non-pressure chronic ulcer of other part of left foot with unspecified severity: Secondary | ICD-10-CM | POA: Diagnosis not present

## 2019-03-08 DIAGNOSIS — L97415 Non-pressure chronic ulcer of right heel and midfoot with muscle involvement without evidence of necrosis: Secondary | ICD-10-CM | POA: Diagnosis not present

## 2019-03-08 DIAGNOSIS — D631 Anemia in chronic kidney disease: Secondary | ICD-10-CM | POA: Insufficient documentation

## 2019-03-08 DIAGNOSIS — Z7982 Long term (current) use of aspirin: Secondary | ICD-10-CM | POA: Diagnosis not present

## 2019-03-08 DIAGNOSIS — I96 Gangrene, not elsewhere classified: Secondary | ICD-10-CM | POA: Diagnosis not present

## 2019-03-08 DIAGNOSIS — M109 Gout, unspecified: Secondary | ICD-10-CM | POA: Diagnosis not present

## 2019-03-08 DIAGNOSIS — E08621 Diabetes mellitus due to underlying condition with foot ulcer: Secondary | ICD-10-CM

## 2019-03-08 DIAGNOSIS — L97513 Non-pressure chronic ulcer of other part of right foot with necrosis of muscle: Secondary | ICD-10-CM | POA: Diagnosis not present

## 2019-03-08 DIAGNOSIS — R011 Cardiac murmur, unspecified: Secondary | ICD-10-CM | POA: Insufficient documentation

## 2019-03-08 DIAGNOSIS — N186 End stage renal disease: Secondary | ICD-10-CM | POA: Diagnosis not present

## 2019-03-08 DIAGNOSIS — E1152 Type 2 diabetes mellitus with diabetic peripheral angiopathy with gangrene: Secondary | ICD-10-CM | POA: Insufficient documentation

## 2019-03-08 DIAGNOSIS — E1122 Type 2 diabetes mellitus with diabetic chronic kidney disease: Secondary | ICD-10-CM | POA: Diagnosis not present

## 2019-03-08 DIAGNOSIS — E7800 Pure hypercholesterolemia, unspecified: Secondary | ICD-10-CM | POA: Insufficient documentation

## 2019-03-08 DIAGNOSIS — I132 Hypertensive heart and chronic kidney disease with heart failure and with stage 5 chronic kidney disease, or end stage renal disease: Secondary | ICD-10-CM | POA: Diagnosis not present

## 2019-03-08 DIAGNOSIS — D689 Coagulation defect, unspecified: Secondary | ICD-10-CM | POA: Insufficient documentation

## 2019-03-08 DIAGNOSIS — Z87891 Personal history of nicotine dependence: Secondary | ICD-10-CM | POA: Diagnosis not present

## 2019-03-08 DIAGNOSIS — R0602 Shortness of breath: Secondary | ICD-10-CM | POA: Diagnosis not present

## 2019-03-08 DIAGNOSIS — I509 Heart failure, unspecified: Secondary | ICD-10-CM | POA: Diagnosis not present

## 2019-03-08 DIAGNOSIS — K219 Gastro-esophageal reflux disease without esophagitis: Secondary | ICD-10-CM | POA: Insufficient documentation

## 2019-03-08 DIAGNOSIS — G4730 Sleep apnea, unspecified: Secondary | ICD-10-CM | POA: Insufficient documentation

## 2019-03-08 DIAGNOSIS — M1990 Unspecified osteoarthritis, unspecified site: Secondary | ICD-10-CM | POA: Insufficient documentation

## 2019-03-08 DIAGNOSIS — Z992 Dependence on renal dialysis: Secondary | ICD-10-CM | POA: Insufficient documentation

## 2019-03-08 DIAGNOSIS — Z95810 Presence of automatic (implantable) cardiac defibrillator: Secondary | ICD-10-CM | POA: Diagnosis not present

## 2019-03-08 HISTORY — PX: AMPUTATION: SHX166

## 2019-03-08 HISTORY — PX: WOUND DEBRIDEMENT: SHX247

## 2019-03-08 HISTORY — DX: Gangrene, not elsewhere classified: I96

## 2019-03-08 LAB — CBC
HCT: 40.3 % (ref 39.0–52.0)
Hemoglobin: 12 g/dL — ABNORMAL LOW (ref 13.0–17.0)
MCH: 30.9 pg (ref 26.0–34.0)
MCHC: 29.8 g/dL — ABNORMAL LOW (ref 30.0–36.0)
MCV: 103.9 fL — ABNORMAL HIGH (ref 80.0–100.0)
Platelets: 320 10*3/uL (ref 150–400)
RBC: 3.88 MIL/uL — ABNORMAL LOW (ref 4.22–5.81)
RDW: 17.3 % — ABNORMAL HIGH (ref 11.5–15.5)
WBC: 8.1 10*3/uL (ref 4.0–10.5)
nRBC: 0 % (ref 0.0–0.2)

## 2019-03-08 LAB — BASIC METABOLIC PANEL
Anion gap: 16 — ABNORMAL HIGH (ref 5–15)
BUN: 26 mg/dL — ABNORMAL HIGH (ref 8–23)
CO2: 23 mmol/L (ref 22–32)
Calcium: 9.2 mg/dL (ref 8.9–10.3)
Chloride: 97 mmol/L — ABNORMAL LOW (ref 98–111)
Creatinine, Ser: 7 mg/dL — ABNORMAL HIGH (ref 0.61–1.24)
GFR calc Af Amer: 8 mL/min — ABNORMAL LOW (ref >60)
GFR calc non Af Amer: 7 mL/min — ABNORMAL LOW (ref >60)
Glucose, Bld: 109 mg/dL — ABNORMAL HIGH (ref 70–99)
Potassium: 3.2 mmol/L — ABNORMAL LOW (ref 3.5–5.1)
Sodium: 136 mmol/L (ref 135–145)

## 2019-03-08 LAB — GLUCOSE, CAPILLARY
Glucose-Capillary: 114 mg/dL — ABNORMAL HIGH (ref 70–99)
Glucose-Capillary: 108 mg/dL — ABNORMAL HIGH (ref 70–99)

## 2019-03-08 SURGERY — AMPUTATION, FOOT, RAY
Anesthesia: Monitor Anesthesia Care | Site: Foot | Laterality: Right

## 2019-03-08 MED ORDER — 0.9 % SODIUM CHLORIDE (POUR BTL) OPTIME
TOPICAL | Status: DC | PRN
  Administered 2019-03-08: 1000 mL

## 2019-03-08 MED ORDER — BUPIVACAINE HCL 0.5 % IJ SOLN
INTRAMUSCULAR | Status: DC | PRN
  Administered 2019-03-08: 20 mL

## 2019-03-08 MED ORDER — SUCCINYLCHOLINE CHLORIDE 200 MG/10ML IV SOSY
PREFILLED_SYRINGE | INTRAVENOUS | Status: AC
  Filled 2019-03-08: qty 10

## 2019-03-08 MED ORDER — FENTANYL CITRATE (PF) 100 MCG/2ML IJ SOLN
INTRAMUSCULAR | Status: DC | PRN
  Administered 2019-03-08: 50 ug via INTRAVENOUS

## 2019-03-08 MED ORDER — ONDANSETRON HCL 4 MG/2ML IJ SOLN
INTRAMUSCULAR | Status: DC | PRN
  Administered 2019-03-08: 4 mg via INTRAVENOUS

## 2019-03-08 MED ORDER — LIDOCAINE 2% (20 MG/ML) 5 ML SYRINGE
INTRAMUSCULAR | Status: AC
  Filled 2019-03-08: qty 5

## 2019-03-08 MED ORDER — CEPHALEXIN 500 MG PO CAPS: 500.0000 mg | ORAL_CAPSULE | Freq: Two times a day (BID) | ORAL | 0 refills | Status: DC

## 2019-03-08 MED ORDER — FENTANYL CITRATE (PF) 100 MCG/2ML IJ SOLN: 25.0000 ug | INTRAMUSCULAR | Status: DC | PRN

## 2019-03-08 MED ORDER — PROPOFOL 10 MG/ML IV BOLUS
INTRAVENOUS | Status: DC | PRN
  Administered 2019-03-08 (×3): 20 mg via INTRAVENOUS

## 2019-03-08 MED ORDER — OXYCODONE HCL 5 MG PO TABS: 5.0000 mg | ORAL_TABLET | Freq: Three times a day (TID) | ORAL | 0 refills | Status: DC | PRN

## 2019-03-08 MED ORDER — ALBUMIN HUMAN 5 % IV SOLN
INTRAVENOUS | Status: AC
  Filled 2019-03-08: qty 250

## 2019-03-08 MED ORDER — PROPOFOL 500 MG/50ML IV EMUL
INTRAVENOUS | Status: DC | PRN
  Administered 2019-03-08: 50 ug/kg/min via INTRAVENOUS

## 2019-03-08 MED ORDER — CEFAZOLIN SODIUM-DEXTROSE 2-4 GM/100ML-% IV SOLN
2.0000 g | INTRAVENOUS | Status: AC
  Administered 2019-03-08: 2 g via INTRAVENOUS
  Filled 2019-03-08: qty 100

## 2019-03-08 MED ORDER — LIDOCAINE 2% (20 MG/ML) 5 ML SYRINGE
INTRAMUSCULAR | Status: DC | PRN
  Administered 2019-03-08: 50 mg via INTRAVENOUS

## 2019-03-08 MED ORDER — SODIUM CHLORIDE 0.9 % IR SOLN
Status: DC | PRN
  Administered 2019-03-08: 1000 mL
  Administered 2019-03-08: 3000 mL

## 2019-03-08 MED ORDER — PHENYLEPHRINE 40 MCG/ML (10ML) SYRINGE FOR IV PUSH (FOR BLOOD PRESSURE SUPPORT)
PREFILLED_SYRINGE | INTRAVENOUS | Status: AC
  Filled 2019-03-08: qty 10

## 2019-03-08 MED ORDER — SODIUM CHLORIDE 0.9 % IV SOLN
INTRAVENOUS | Status: DC | PRN
  Administered 2019-03-08: 07:00:00 via INTRAVENOUS

## 2019-03-08 MED ORDER — ALBUMIN HUMAN 5 % IV SOLN
12.5000 g | Freq: Once | INTRAVENOUS | Status: AC
  Administered 2019-03-08: 10:00:00 12.5 g via INTRAVENOUS

## 2019-03-08 MED ORDER — HEMOSTATIC AGENTS (NO CHARGE) OPTIME
TOPICAL | Status: DC | PRN
  Administered 2019-03-08: 1

## 2019-03-08 MED ORDER — VANCOMYCIN HCL 1000 MG IV SOLR
INTRAVENOUS | Status: AC
  Filled 2019-03-08: qty 1000

## 2019-03-08 MED ORDER — FENTANYL CITRATE (PF) 250 MCG/5ML IJ SOLN
INTRAMUSCULAR | Status: AC
  Filled 2019-03-08: qty 5

## 2019-03-08 MED ORDER — ALBUMIN HUMAN 5 % IV SOLN
12.5000 g | Freq: Once | INTRAVENOUS | Status: AC
  Administered 2019-03-08: 12.5 g via INTRAVENOUS

## 2019-03-08 MED ORDER — ONDANSETRON HCL 4 MG/2ML IJ SOLN
INTRAMUSCULAR | Status: AC
  Filled 2019-03-08: qty 2

## 2019-03-08 MED ORDER — PROPOFOL 10 MG/ML IV BOLUS
INTRAVENOUS | Status: AC
  Filled 2019-03-08: qty 20

## 2019-03-08 MED ORDER — ALBUMIN HUMAN 5 % IV SOLN
INTRAVENOUS | Status: AC
  Administered 2019-03-08: 12.5 g via INTRAVENOUS
  Filled 2019-03-08: qty 250

## 2019-03-08 MED ORDER — BUPIVACAINE HCL (PF) 0.5 % IJ SOLN
INTRAMUSCULAR | Status: AC
  Filled 2019-03-08: qty 30

## 2019-03-08 MED ORDER — VANCOMYCIN HCL 1 G IV SOLR
INTRAVENOUS | Status: DC | PRN
  Administered 2019-03-08: 1000 mg

## 2019-03-08 MED ORDER — SODIUM CHLORIDE 0.9 % IV SOLN
INTRAVENOUS | Status: DC | PRN
  Administered 2019-03-08: 60 ug/min via INTRAVENOUS

## 2019-03-08 SURGICAL SUPPLY — 46 items
AGENT HMST PWDR BTL CLGN 5GM (Miscellaneous) ×2 IMPLANT
BNDG ELASTIC 3X5.8 VLCR STR LF (GAUZE/BANDAGES/DRESSINGS) ×2 IMPLANT
BNDG ELASTIC 4X5.8 VLCR STR LF (GAUZE/BANDAGES/DRESSINGS) ×4 IMPLANT
BNDG GAUZE ELAST 4 BULKY (GAUZE/BANDAGES/DRESSINGS) ×6 IMPLANT
COLLAGEN CELLERATERX 5 GRAM (Miscellaneous) ×2 IMPLANT
CONT SPEC 4OZ CLIKSEAL STRL BL (MISCELLANEOUS) ×2 IMPLANT
COVER SURGICAL LIGHT HANDLE (MISCELLANEOUS) ×4 IMPLANT
COVER WAND RF STERILE (DRAPES) ×4 IMPLANT
DRSG EMULSION OIL 3X3 NADH (GAUZE/BANDAGES/DRESSINGS) ×4 IMPLANT
DRSG TELFA 3X8 NADH (GAUZE/BANDAGES/DRESSINGS) ×4 IMPLANT
ELECT CAUTERY BLADE 6.4 (BLADE) ×4 IMPLANT
ELECT REM PT RETURN 9FT ADLT (ELECTROSURGICAL) ×4
ELECTRODE REM PT RTRN 9FT ADLT (ELECTROSURGICAL) ×2 IMPLANT
GAUZE SPONGE 4X4 12PLY STRL (GAUZE/BANDAGES/DRESSINGS) ×6 IMPLANT
GAUZE XEROFORM 1X8 LF (GAUZE/BANDAGES/DRESSINGS) ×2 IMPLANT
GLOVE BIO SURGEON STRL SZ7.5 (GLOVE) ×6 IMPLANT
GLOVE BIOGEL PI IND STRL 8 (GLOVE) ×2 IMPLANT
GLOVE BIOGEL PI INDICATOR 8 (GLOVE) ×2
GOWN STRL REUS W/ TWL LRG LVL3 (GOWN DISPOSABLE) ×2 IMPLANT
GOWN STRL REUS W/ TWL XL LVL3 (GOWN DISPOSABLE) ×2 IMPLANT
GOWN STRL REUS W/TWL LRG LVL3 (GOWN DISPOSABLE) ×4
GOWN STRL REUS W/TWL XL LVL3 (GOWN DISPOSABLE) ×4
HEMOSTAT SURGICEL 2X4 FIBR (HEMOSTASIS) ×2 IMPLANT
KIT BASIN OR (CUSTOM PROCEDURE TRAY) ×4 IMPLANT
KIT TURNOVER KIT B (KITS) ×4 IMPLANT
MATRIX WOUND MESHED 2X2 (Tissue) IMPLANT
NDL HYPO 25GX1X1/2 BEV (NEEDLE) ×2 IMPLANT
NEEDLE HYPO 25GX1X1/2 BEV (NEEDLE) ×4 IMPLANT
NS IRRIG 1000ML POUR BTL (IV SOLUTION) ×4 IMPLANT
PACK ORTHO EXTREMITY (CUSTOM PROCEDURE TRAY) ×4 IMPLANT
PAD ARMBOARD 7.5X6 YLW CONV (MISCELLANEOUS) ×4 IMPLANT
PAD DRESSING TELFA 3X8 NADH (GAUZE/BANDAGES/DRESSINGS) IMPLANT
PROBE DEBRIDE SONICVAC MISONIX (TIP) ×2 IMPLANT
SET CYSTO W/LG BORE CLAMP LF (SET/KITS/TRAYS/PACK) ×2 IMPLANT
SOL PREP POV-IOD 4OZ 10% (MISCELLANEOUS) ×4 IMPLANT
STAPLER VISISTAT (STAPLE) ×2 IMPLANT
SUCTION FRAZIER TIP 8 FR DISP (SUCTIONS) ×2
SUCTION TUBE FRAZIER 8FR DISP (SUCTIONS) IMPLANT
SUT ETHILON 3 0 PS 1 (SUTURE) ×6 IMPLANT
SYR CONTROL 10ML LL (SYRINGE) ×4 IMPLANT
TOWEL GREEN STERILE (TOWEL DISPOSABLE) ×4 IMPLANT
TUBE CONNECTING 12'X1/4 (SUCTIONS) ×1
TUBE CONNECTING 12X1/4 (SUCTIONS) ×3 IMPLANT
TUBE IRRIGATION SET MISONIX (TUBING) ×2 IMPLANT
WOUND MATRIX MESHED 2X2 (Tissue) ×2 IMPLANT
YANKAUER SUCT BULB TIP NO VENT (SUCTIONS) ×4 IMPLANT

## 2019-03-08 NOTE — Interval H&P Note (Signed)
History and Physical Interval Note:  03/08/2019 7:19 AM  Timothy Faulkner  has presented today for surgery, with the diagnosis of GANGRENE, DIABETIC ULCER.  The various methods of treatment have been discussed with the patient and family. After consideration of risks, benefits and other options for treatment, the patient has consented to  Procedure(s): AMPUTATION  3RD AND 4TH TOES LEFT FOOT (Left) DEBRIDEMENT WOUND RIGHT FOOT (Right) as a surgical intervention.  The patient's history has been reviewed, patient examined, no change in status, stable for surgery.  I have reviewed the patient's chart and labs.  Questions were answered to the patient's satisfaction.     [name]

## 2019-03-08 NOTE — Transfer of Care (Signed)
Immediate Anesthesia Transfer of Care Note  Patient: Timothy Faulkner  Procedure(s) Performed: AMPUTATION  3RD AND 4TH TOES LEFT FOOT (Left Foot) DEBRIDEMENT WOUND RIGHT FOOT (Right Foot)  Patient Location: PACU  Anesthesia Type:MAC  Level of Consciousness: awake, alert , oriented and patient cooperative  Airway & Oxygen Therapy: Patient Spontanous Breathing and Patient connected to face mask oxygen  Post-op Assessment: Report given to RN and Post -op Vital signs reviewed and stable  Post vital signs: Reviewed and stable  Last Vitals:  Vitals Value Taken Time  BP    Temp    Pulse    Resp 17 03/08/19 0835  SpO2    Vitals shown include unvalidated device data.  Last Pain:  Vitals:   03/08/19 0640  TempSrc: Oral         Complications: No apparent anesthesia complications

## 2019-03-08 NOTE — Anesthesia Postprocedure Evaluation (Signed)
Anesthesia Post Note  Patient: Nelva Nay  Procedure(s) Performed: AMPUTATION  3RD AND 4TH TOES LEFT FOOT (Left Foot) DEBRIDEMENT WOUND RIGHT FOOT (Right Foot)     Patient location during evaluation: PACU Anesthesia Type: MAC Level of consciousness: awake and alert Pain management: pain level controlled Vital Signs Assessment: post-procedure vital signs reviewed and stable Respiratory status: spontaneous breathing, nonlabored ventilation and respiratory function stable Cardiovascular status: stable and blood pressure returned to baseline Postop Assessment: no apparent nausea or vomiting Anesthetic complications: no    Last Vitals:  Vitals:   03/08/19 1000 03/08/19 1004  BP:  93/72  Pulse: 88   Resp: 14 19  Temp:    SpO2: 100%     Last Pain:  Vitals:   03/08/19 1000  TempSrc:   PainSc: 0-No pain                 Mckinsley Koelzer,W. EDMOND

## 2019-03-08 NOTE — Op Note (Signed)
Patient Name: Timothy Faulkner DOB: February 01, 1946  MRN: [id number]   Date of Service: 03/08/2019   Surgeon: Dr. Hardie Pulley, DPM Assistants: None Pre-operative Diagnosis: Ulcer right foot with necrosis of muscle, gangrene of the left foot Post-operative Diagnosis: Same Procedures:             1) Debridement of ulcer right foot  2) Application skin graft substitute right foot  3) Amputation left third toe at the metatarsophalangeal joint  4) Amputation left fourth toe at the metatarsal phalangeal joint Pathology/Specimens: ID Type Source Tests Collected by Time Destination  1 : THIRD, FOURTH TOE-LEFT FOOT Amputation Toe, Left SURGICAL PATHOLOGY [name], DPM 03/08/2019 0820   A : DEEP SOFT TISSUE CULTURE- LEFT FOOT Tissue Soft Tissue, Other GRAM STAIN, AEROBIC/ANAEROBIC CULTURE (SURGICAL/DEEP WOUND) [name], DPM 03/08/2019 0815    Anesthesia: MAC/local Hemostasis: Anatomic Estimated Blood Loss: 20 ml Materials:  Implant Name Type Inv. Item Serial No. Manufacturer Lot No. LRB No. Used Action  WOUND MATRIX MESHED 2X2 - [id number] Tissue WOUND MATRIX MESHED 2X2  [employer] [id number] Right 1 Implanted  COLLAGEN CELLERATERX 5 GRAM - [id number] Miscellaneous COLLAGEN CELLERATERX 5 GRAM [id number] [employer]  Right 1 Implanted    Medications: 1g Vancomycin powder,  Complications: None  Indications for Procedure:  This is a 73 y.o. male with a chronic wound to the right foot and gangrene to the toes left foot.  Discussed the patient benefit from amputation of the toes left foot and debridement of the right foot with application of a skin graft substitute to promote healing.  All risk benefits alternatives of surgery discussed the patient no guarantees were given.   Procedure in Detail: Patient was identified in pre-operative holding area. Formal consent was signed and both lower extremity were marked. Patient was brought back to the operating room and  placed on the operating room table in the supine position. Anesthesia was induced.   The extremity was prepped and draped in the usual sterile fashion. Timeout was taken to confirm patient name, laterality, and procedure prior to incision. Attention was then directed to the right  foot where a wound measuring 2.5x4 was encountered.  The wound was sharply excisionally debrided with a 15 blade, followed by a misonix ultrasonic debrider. Debridement was performed to bleeding, viable wound base. The wound was debrided to the level of the fascial tissue. Following debridement the wound measured 4.5x2.5.  An integra bilayer graft was hydrated and stapled into position. The foot was then dressed with telfa 4x4, kerlix, and ACE bandage.   Attention was then directed to the left foot with gangrenous toes noted about the third and fourth toes.  Incision was made proximal to the area of gangrenous tissue about the fourth toe and dissection was carried down to skin subcu tissue to the level of the bone.  The bone was testicular at the metatarsal phalangeal joint.  The proximal phalanx was rather soft and did fracture during dissection however the metatarsal head appeared healthy and viable.  The tissue surrounding the left fourth metatarsophalangeal joint was healthy and viable with good bleeding tissue.  The toe was completely removed and collected for pathology.  Attention was then directed to the left third toe where disarticulate was similarly performed over the third metatarsal phalangeal joint.  At this area however there was significantly more devitalized tissue.  Sharp excisional debridement was continued until good viable bleeding tissue was noted.  The wound was then copiously irrigated with  3 L of normal saline.  There was not enough tissue to completely close the wound without significant tension.  A mixture of vancomycin powder and collagen powder were packed into the wound.  The wound edges were brought  together with retention sutures of 3-0 nylon.  The metatarsal heads were completely covered. The wound was then dressed with Xeroform, fibrillar, 4 x 4, Kerlix, Ace bandage.  Patient tolerated procedure well  Disposition: Following a period of post-operative monitoring, patient will be transferred back home.  He will likely benefit from either repeat debridement of both wounds with possible attempted closure of the left wound.  He could need a wound VAC depending upon tissue healing.  We will monitor for signs of healing.  Hopefully he will not require metatarsal resections of the third and fourth metatarsals however should they become infected this will have to be addressed.  We were able to cover them today with the soft tissue closure.

## 2019-03-08 NOTE — Anesthesia Procedure Notes (Signed)
Procedure Name: MAC Date/Time: 03/08/2019 7:44 AM Performed by: Orlie Dakin, CRNA Pre-anesthesia Checklist: Patient identified, Emergency Drugs available, Suction available and Patient being monitored Oxygen Delivery Method: Simple face mask Preoxygenation: Pre-oxygenation with 100% oxygen Induction Type: IV induction

## 2019-03-09 ENCOUNTER — Telehealth: Payer: Self-pay | Admitting: Podiatry

## 2019-03-09 ENCOUNTER — Encounter (HOSPITAL_COMMUNITY): Payer: Self-pay | Admitting: Podiatry

## 2019-03-09 NOTE — Telephone Encounter (Signed)
Called patient doing well no post-op issues or concerns.

## 2019-03-10 ENCOUNTER — Encounter: Payer: Self-pay | Admitting: *Deleted

## 2019-03-10 ENCOUNTER — Telehealth: Payer: Self-pay | Admitting: *Deleted

## 2019-03-10 ENCOUNTER — Other Ambulatory Visit: Payer: Self-pay

## 2019-03-10 ENCOUNTER — Ambulatory Visit (INDEPENDENT_AMBULATORY_CARE_PROVIDER_SITE_OTHER): Payer: Medicare Other | Admitting: Podiatry

## 2019-03-10 VITALS — Temp 97.8°F

## 2019-03-10 DIAGNOSIS — L97511 Non-pressure chronic ulcer of other part of right foot limited to breakdown of skin: Secondary | ICD-10-CM

## 2019-03-10 DIAGNOSIS — D689 Coagulation defect, unspecified: Secondary | ICD-10-CM

## 2019-03-10 DIAGNOSIS — I96 Gangrene, not elsewhere classified: Secondary | ICD-10-CM

## 2019-03-10 DIAGNOSIS — T8743 Infection of amputation stump, right lower extremity: Secondary | ICD-10-CM | POA: Insufficient documentation

## 2019-03-10 DIAGNOSIS — E08621 Diabetes mellitus due to underlying condition with foot ulcer: Secondary | ICD-10-CM | POA: Diagnosis not present

## 2019-03-10 DIAGNOSIS — I739 Peripheral vascular disease, unspecified: Secondary | ICD-10-CM

## 2019-03-10 MED ORDER — MEDIHONEY WOUND/BURN DRESSING EX GEL: CUTANEOUS | 5 refills | Status: DC

## 2019-03-10 MED ORDER — DEXTROSE 5 % IV SOLN: 1.0000 g | INTRAVENOUS | 0 refills | Status: DC | PRN

## 2019-03-10 NOTE — Telephone Encounter (Signed)
[name] ordered Ceftriaxone 2grams IV in dialysis for 2 weeks #6 doses and HHC right foot dressing is to remain intact, left foot medihoney cover with dry sterile dressing 3 times a week. I called [facility] on [address] transferred to [place], transferred to IT consultant. Coleen states they do not carry Ceftriaxone at their center and transferred to [place], [place]. Katie, PA states they have ceftazidime, Cefepime. I told Katie, [place] I would need to call back with updated orders from [name].

## 2019-03-10 NOTE — Telephone Encounter (Addendum)
I called [facility] - Belenda Cruise states pt's case is open but on hold possibly due to being in the hospital, fax new orders to [place]. Faxed Dr. Eleanora Neighbor 03/10/2019 12:15pm typed orders to [facility].

## 2019-03-10 NOTE — Telephone Encounter (Signed)
[name] ordered Ceftazidime/Fortaz 1 gram after dialysis in IV for 2 weeks equaling 6 dosages. I called [facility] - Rowena transferred to [employer] and I was transferred to [employer]. I gave Coleen Dr. Eleanora Neighbor orders for Ceftazidime/Fortaz 1 gram IV after dialysis for 2 weeks equaling 6 dosages.

## 2019-03-12 ENCOUNTER — Inpatient Hospital Stay (HOSPITAL_COMMUNITY)
Admission: EM | Admit: 2019-03-12 | Discharge: 2019-03-17 | DRG: 856 | Disposition: A | Discharge status: Rehabilitation Facility | Payer: Medicare Other | Attending: Internal Medicine | Admitting: Internal Medicine

## 2019-03-12 ENCOUNTER — Emergency Department (HOSPITAL_COMMUNITY): Payer: Medicare Other

## 2019-03-12 ENCOUNTER — Telehealth: Payer: Self-pay | Admitting: Podiatry

## 2019-03-12 ENCOUNTER — Other Ambulatory Visit: Payer: Self-pay

## 2019-03-12 ENCOUNTER — Encounter (HOSPITAL_COMMUNITY): Payer: Self-pay | Admitting: *Deleted

## 2019-03-12 DIAGNOSIS — Z89421 Acquired absence of other right toe(s): Secondary | ICD-10-CM

## 2019-03-12 DIAGNOSIS — E1122 Type 2 diabetes mellitus with diabetic chronic kidney disease: Secondary | ICD-10-CM | POA: Diagnosis present

## 2019-03-12 DIAGNOSIS — G8918 Other acute postprocedural pain: Secondary | ICD-10-CM | POA: Diagnosis not present

## 2019-03-12 DIAGNOSIS — I5022 Chronic systolic (congestive) heart failure: Secondary | ICD-10-CM | POA: Diagnosis present

## 2019-03-12 DIAGNOSIS — G546 Phantom limb syndrome with pain: Secondary | ICD-10-CM | POA: Diagnosis not present

## 2019-03-12 DIAGNOSIS — Z8249 Family history of ischemic heart disease and other diseases of the circulatory system: Secondary | ICD-10-CM

## 2019-03-12 DIAGNOSIS — E1152 Type 2 diabetes mellitus with diabetic peripheral angiopathy with gangrene: Secondary | ICD-10-CM | POA: Diagnosis present

## 2019-03-12 DIAGNOSIS — I998 Other disorder of circulatory system: Secondary | ICD-10-CM | POA: Diagnosis present

## 2019-03-12 DIAGNOSIS — I132 Hypertensive heart and chronic kidney disease with heart failure and with stage 5 chronic kidney disease, or end stage renal disease: Secondary | ICD-10-CM | POA: Diagnosis present

## 2019-03-12 DIAGNOSIS — L97512 Non-pressure chronic ulcer of other part of right foot with fat layer exposed: Secondary | ICD-10-CM | POA: Diagnosis present

## 2019-03-12 DIAGNOSIS — Z992 Dependence on renal dialysis: Secondary | ICD-10-CM

## 2019-03-12 DIAGNOSIS — I96 Gangrene, not elsewhere classified: Secondary | ICD-10-CM | POA: Diagnosis present

## 2019-03-12 DIAGNOSIS — R34 Anuria and oliguria: Secondary | ICD-10-CM | POA: Diagnosis not present

## 2019-03-12 DIAGNOSIS — Z89512 Acquired absence of left leg below knee: Secondary | ICD-10-CM | POA: Diagnosis not present

## 2019-03-12 DIAGNOSIS — E8889 Other specified metabolic disorders: Secondary | ICD-10-CM | POA: Diagnosis present

## 2019-03-12 DIAGNOSIS — I739 Peripheral vascular disease, unspecified: Secondary | ICD-10-CM

## 2019-03-12 DIAGNOSIS — I9589 Other hypotension: Secondary | ICD-10-CM | POA: Diagnosis present

## 2019-03-12 DIAGNOSIS — I10 Essential (primary) hypertension: Secondary | ICD-10-CM | POA: Diagnosis present

## 2019-03-12 DIAGNOSIS — I70229 Atherosclerosis of native arteries of extremities with rest pain, unspecified extremity: Secondary | ICD-10-CM

## 2019-03-12 DIAGNOSIS — E7800 Pure hypercholesterolemia, unspecified: Secondary | ICD-10-CM | POA: Diagnosis present

## 2019-03-12 DIAGNOSIS — D638 Anemia in other chronic diseases classified elsewhere: Secondary | ICD-10-CM | POA: Diagnosis present

## 2019-03-12 DIAGNOSIS — N186 End stage renal disease: Secondary | ICD-10-CM

## 2019-03-12 DIAGNOSIS — L089 Local infection of the skin and subcutaneous tissue, unspecified: Secondary | ICD-10-CM | POA: Diagnosis present

## 2019-03-12 DIAGNOSIS — E11621 Type 2 diabetes mellitus with foot ulcer: Secondary | ICD-10-CM | POA: Diagnosis present

## 2019-03-12 DIAGNOSIS — Z7902 Long term (current) use of antithrombotics/antiplatelets: Secondary | ICD-10-CM

## 2019-03-12 DIAGNOSIS — M869 Osteomyelitis, unspecified: Secondary | ICD-10-CM | POA: Diagnosis present

## 2019-03-12 DIAGNOSIS — Z7982 Long term (current) use of aspirin: Secondary | ICD-10-CM

## 2019-03-12 DIAGNOSIS — E785 Hyperlipidemia, unspecified: Secondary | ICD-10-CM | POA: Diagnosis present

## 2019-03-12 DIAGNOSIS — N2581 Secondary hyperparathyroidism of renal origin: Secondary | ICD-10-CM | POA: Diagnosis present

## 2019-03-12 DIAGNOSIS — M109 Gout, unspecified: Secondary | ICD-10-CM | POA: Diagnosis present

## 2019-03-12 DIAGNOSIS — Z20828 Contact with and (suspected) exposure to other viral communicable diseases: Secondary | ICD-10-CM | POA: Diagnosis present

## 2019-03-12 DIAGNOSIS — Z95810 Presence of automatic (implantable) cardiac defibrillator: Secondary | ICD-10-CM

## 2019-03-12 DIAGNOSIS — D62 Acute posthemorrhagic anemia: Secondary | ICD-10-CM | POA: Diagnosis not present

## 2019-03-12 DIAGNOSIS — T8149XA Infection following a procedure, other surgical site, initial encounter: Principal | ICD-10-CM | POA: Diagnosis present

## 2019-03-12 DIAGNOSIS — K219 Gastro-esophageal reflux disease without esophagitis: Secondary | ICD-10-CM | POA: Diagnosis present

## 2019-03-12 DIAGNOSIS — Z79899 Other long term (current) drug therapy: Secondary | ICD-10-CM

## 2019-03-12 DIAGNOSIS — I428 Other cardiomyopathies: Secondary | ICD-10-CM | POA: Diagnosis present

## 2019-03-12 DIAGNOSIS — Z87891 Personal history of nicotine dependence: Secondary | ICD-10-CM

## 2019-03-12 DIAGNOSIS — Z79891 Long term (current) use of opiate analgesic: Secondary | ICD-10-CM

## 2019-03-12 LAB — PREALBUMIN: Prealbumin: 12.1 mg/dL — ABNORMAL LOW (ref 18–38)

## 2019-03-12 LAB — CBC WITH DIFFERENTIAL/PLATELET
Abs Immature Granulocytes: 0.05 10*3/uL (ref 0.00–0.07)
Basophils Absolute: 0 10*3/uL (ref 0.0–0.1)
Basophils Relative: 0 %
Eosinophils Absolute: 0.1 10*3/uL (ref 0.0–0.5)
Eosinophils Relative: 1 %
HCT: 39.2 % (ref 39.0–52.0)
Hemoglobin: 11.7 g/dL — ABNORMAL LOW (ref 13.0–17.0)
Immature Granulocytes: 1 %
Lymphocytes Relative: 8 %
Lymphs Abs: 0.8 10*3/uL (ref 0.7–4.0)
MCH: 30.5 pg (ref 26.0–34.0)
MCHC: 29.8 g/dL — ABNORMAL LOW (ref 30.0–36.0)
MCV: 102.1 fL — ABNORMAL HIGH (ref 80.0–100.0)
Monocytes Absolute: 1 10*3/uL (ref 0.1–1.0)
Monocytes Relative: 10 %
Neutro Abs: 7.9 10*3/uL — ABNORMAL HIGH (ref 1.7–7.7)
Neutrophils Relative %: 80 %
Platelets: 356 10*3/uL (ref 150–400)
RBC: 3.84 MIL/uL — ABNORMAL LOW (ref 4.22–5.81)
RDW: 16.9 % — ABNORMAL HIGH (ref 11.5–15.5)
WBC: 9.9 10*3/uL (ref 4.0–10.5)
nRBC: 0 % (ref 0.0–0.2)

## 2019-03-12 LAB — COMPREHENSIVE METABOLIC PANEL
ALT: 6 U/L (ref 0–44)
AST: 17 U/L (ref 15–41)
Albumin: 3 g/dL — ABNORMAL LOW (ref 3.5–5.0)
Alkaline Phosphatase: 66 U/L (ref 38–126)
Anion gap: 17 — ABNORMAL HIGH (ref 5–15)
BUN: 25 mg/dL — ABNORMAL HIGH (ref 8–23)
CO2: 22 mmol/L (ref 22–32)
Calcium: 9.4 mg/dL (ref 8.9–10.3)
Chloride: 94 mmol/L — ABNORMAL LOW (ref 98–111)
Creatinine, Ser: 5.97 mg/dL — ABNORMAL HIGH (ref 0.61–1.24)
GFR calc Af Amer: 10 mL/min — ABNORMAL LOW (ref >60)
GFR calc non Af Amer: 9 mL/min — ABNORMAL LOW (ref >60)
Glucose, Bld: 87 mg/dL (ref 70–99)
Potassium: 3.9 mmol/L (ref 3.5–5.1)
Sodium: 133 mmol/L — ABNORMAL LOW (ref 135–145)
Total Bilirubin: 0.9 mg/dL (ref 0.3–1.2)
Total Protein: 8.1 g/dL (ref 6.5–8.1)

## 2019-03-12 LAB — PROTIME-INR
INR: 1.2 (ref 0.8–1.2)
Prothrombin Time: 15.5 seconds — ABNORMAL HIGH (ref 11.4–15.2)

## 2019-03-12 LAB — LACTIC ACID, PLASMA: Lactic Acid, Venous: 1.6 mmol/L (ref 0.5–1.9)

## 2019-03-12 LAB — C-REACTIVE PROTEIN: CRP: 27.1 mg/dL — ABNORMAL HIGH (ref <1.0)

## 2019-03-12 LAB — GLUCOSE, CAPILLARY: Glucose-Capillary: 80 mg/dL (ref 70–99)

## 2019-03-12 LAB — SEDIMENTATION RATE: Sed Rate: 108 mm/hr — ABNORMAL HIGH (ref 0–16)

## 2019-03-12 LAB — HEMOGLOBIN A1C
Hgb A1c MFr Bld: 5.2 % (ref 4.8–5.6)
Mean Plasma Glucose: 102.54 mg/dL

## 2019-03-12 MED ORDER — VANCOMYCIN HCL IN DEXTROSE 750-5 MG/150ML-% IV SOLN
750.0000 mg | INTRAVENOUS | Status: DC
  Administered 2019-03-14: 750 mg via INTRAVENOUS
  Filled 2019-03-12: qty 150

## 2019-03-12 MED ORDER — RENA-VITE PO TABS
1.0000 | ORAL_TABLET | Freq: Every day | ORAL | Status: DC
  Administered 2019-03-12 – 2019-03-16 (×5): 1 via ORAL
  Filled 2019-03-12 (×5): qty 1

## 2019-03-12 MED ORDER — CALCIUM CARBONATE ANTACID 1250 MG/5ML PO SUSP
500.0000 mg | Freq: Four times a day (QID) | ORAL | Status: DC | PRN
  Filled 2019-03-12: qty 5

## 2019-03-12 MED ORDER — MIDODRINE HCL 5 MG PO TABS
10.0000 mg | ORAL_TABLET | ORAL | Status: DC
  Administered 2019-03-14 – 2019-03-16 (×2): 10 mg via ORAL

## 2019-03-12 MED ORDER — FERRIC CITRATE 1 GM 210 MG(FE) PO TABS
420.0000 mg | ORAL_TABLET | ORAL | Status: DC
  Administered 2019-03-12 – 2019-03-17 (×6): 420 mg via ORAL
  Filled 2019-03-12 (×2): qty 2

## 2019-03-12 MED ORDER — ASPIRIN EC 81 MG PO TBEC
81.0000 mg | DELAYED_RELEASE_TABLET | Freq: Every day | ORAL | Status: DC
  Administered 2019-03-13 – 2019-03-17 (×4): 81 mg via ORAL
  Filled 2019-03-12 (×6): qty 1

## 2019-03-12 MED ORDER — FERRIC CITRATE 1 GM 210 MG(FE) PO TABS
420.0000 mg | ORAL_TABLET | Freq: Three times a day (TID) | ORAL | Status: DC
  Administered 2019-03-13 – 2019-03-17 (×9): 420 mg via ORAL
  Filled 2019-03-12 (×11): qty 2

## 2019-03-12 MED ORDER — FERRIC CITRATE 1 GM 210 MG(FE) PO TABS
420.0000 mg | ORAL_TABLET | Freq: Once | ORAL | Status: AC
  Filled 2019-03-12: qty 2

## 2019-03-12 MED ORDER — SORBITOL 70 % SOLN
30.0000 mL | Status: DC | PRN
  Filled 2019-03-12: qty 30

## 2019-03-12 MED ORDER — ZOLPIDEM TARTRATE 5 MG PO TABS
5.0000 mg | ORAL_TABLET | Freq: Every evening | ORAL | Status: DC | PRN
  Administered 2019-03-12 – 2019-03-16 (×4): 5 mg via ORAL
  Filled 2019-03-12 (×4): qty 1

## 2019-03-12 MED ORDER — ACETAMINOPHEN 650 MG RE SUPP: 650.0000 mg | Freq: Four times a day (QID) | RECTAL | Status: DC | PRN

## 2019-03-12 MED ORDER — NEPRO/CARBSTEADY PO LIQD
237.0000 mL | Freq: Three times a day (TID) | ORAL | Status: DC | PRN
  Filled 2019-03-12: qty 237

## 2019-03-12 MED ORDER — HEPARIN SODIUM (PORCINE) 5000 UNIT/ML IJ SOLN
5000.0000 [IU] | Freq: Three times a day (TID) | INTRAMUSCULAR | Status: DC
  Administered 2019-03-12 – 2019-03-17 (×12): 5000 [IU] via SUBCUTANEOUS
  Filled 2019-03-12 (×14): qty 1

## 2019-03-12 MED ORDER — ONDANSETRON HCL 4 MG/2ML IJ SOLN: 4.0000 mg | Freq: Four times a day (QID) | INTRAMUSCULAR | Status: DC | PRN

## 2019-03-12 MED ORDER — HYDROXYZINE HCL 25 MG PO TABS
25.0000 mg | ORAL_TABLET | Freq: Three times a day (TID) | ORAL | Status: DC | PRN
  Filled 2019-03-12: qty 1

## 2019-03-12 MED ORDER — ROSUVASTATIN CALCIUM 5 MG PO TABS
10.0000 mg | ORAL_TABLET | Freq: Every day | ORAL | Status: DC
  Administered 2019-03-12 – 2019-03-16 (×5): 10 mg via ORAL
  Filled 2019-03-12 (×5): qty 2

## 2019-03-12 MED ORDER — ONDANSETRON HCL 4 MG PO TABS: 4.0000 mg | ORAL_TABLET | Freq: Four times a day (QID) | ORAL | Status: DC | PRN

## 2019-03-12 MED ORDER — CAMPHOR-MENTHOL 0.5-0.5 % EX LOTN
1.0000 "application " | TOPICAL_LOTION | Freq: Three times a day (TID) | CUTANEOUS | Status: DC | PRN
  Filled 2019-03-12: qty 222

## 2019-03-12 MED ORDER — VANCOMYCIN HCL 10 G IV SOLR
1500.0000 mg | Freq: Once | INTRAVENOUS | Status: AC
  Administered 2019-03-12: 1500 mg via INTRAVENOUS
  Filled 2019-03-12: qty 1500

## 2019-03-12 MED ORDER — CALCITRIOL 0.5 MCG PO CAPS
0.5000 ug | ORAL_CAPSULE | ORAL | Status: DC
  Administered 2019-03-14 – 2019-03-16 (×2): 0.5 ug via ORAL

## 2019-03-12 MED ORDER — NEPRO/CARBSTEADY PO LIQD
237.0000 mL | Freq: Two times a day (BID) | ORAL | Status: DC
  Administered 2019-03-16 – 2019-03-17 (×2): 237 mL via ORAL

## 2019-03-12 MED ORDER — COLLAGENASE 250 UNIT/GM EX OINT
1.0000 "application " | TOPICAL_OINTMENT | Freq: Every day | CUTANEOUS | Status: DC
  Administered 2019-03-15 – 2019-03-17 (×3): 1 via TOPICAL
  Filled 2019-03-12: qty 30

## 2019-03-12 MED ORDER — PRO-STAT SUGAR FREE PO LIQD
30.0000 mL | Freq: Two times a day (BID) | ORAL | Status: DC
  Administered 2019-03-12 – 2019-03-17 (×7): 30 mL via ORAL
  Filled 2019-03-12 (×10): qty 30

## 2019-03-12 MED ORDER — OXYCODONE HCL 5 MG PO TABS
5.0000 mg | ORAL_TABLET | Freq: Three times a day (TID) | ORAL | Status: DC | PRN
  Administered 2019-03-13 – 2019-03-17 (×7): 5 mg via ORAL
  Filled 2019-03-12 (×7): qty 1

## 2019-03-12 MED ORDER — CINACALCET HCL 30 MG PO TABS
30.0000 mg | ORAL_TABLET | ORAL | Status: DC
  Administered 2019-03-14 – 2019-03-16 (×2): 30 mg via ORAL
  Filled 2019-03-12 (×2): qty 1

## 2019-03-12 MED ORDER — DOCUSATE SODIUM 283 MG RE ENEM
1.0000 | ENEMA | RECTAL | Status: DC | PRN
  Filled 2019-03-12: qty 1

## 2019-03-12 MED ORDER — ACETAMINOPHEN 325 MG PO TABS
650.0000 mg | ORAL_TABLET | Freq: Four times a day (QID) | ORAL | Status: DC | PRN
  Administered 2019-03-12: 650 mg via ORAL
  Filled 2019-03-12 (×2): qty 2

## 2019-03-12 NOTE — ED Notes (Addendum)
ED TO INPATIENT HANDOFF REPORT  ED Nurse Name and Phone #: [name] Name/Age/Gender Timothy Faulkner 73 y.o. male Room/Bed: 037C/037C  Code Status   Code Status: Full Code  Home/SNF/Other Home Patient oriented to: self, place, time and situation Is this baseline? Yes   Triage Complete: Triage complete  Chief Complaint toe pain  Triage Note To ED for eval of left foot toe 'turning dark'. Pt states his daughter was changing his dressings - when she noticed a toe turning dark. She called the pts doctor and told to come to ED. Pt recently had 2 toes removed.    Allergies No Known Allergies  Level of Care/Admitting Diagnosis ED Disposition    ED Disposition Condition Comment   Admit  Hospital Area: [place] [[id number]]  Level of Care: Telemetry Medical [104]  Covid Evaluation: Confirmed COVID Negative  Diagnosis: Critical lower limb ischemia [818299]  Admitting Physician: Karmen Bongo [[id number]]  Attending Physician: Karmen Bongo [[id number]]  Estimated length of stay: past midnight tomorrow  Certification:: I certify this patient will need inpatient services for at least 2 midnights  PT Class (Do Not Modify): Inpatient [101]  PT Acc Code (Do Not Modify): Private [1]       B Medical/Surgery History Past Medical History:  Diagnosis Date  . Anemia   . Arthritis    Gout  . Automatic implantable cardioverter-defibrillator in situ    [employer]  . CHF   . ESRD on dialysis [facility])    [name] 03/11/2013 (03/11/2013) dialysis M/W/F  . Gangrene ([place])    left foot  . GERD (gastroesophageal reflux disease)   . Gout    "once a year"  . Heart murmur   . HYPERCHOLESTEROLEMIA, MIXED   . HYPERTENSION   . Macular degeneration   . Osteomyelitis ([place])    right foot  . 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 Pacemaker   . Peripheral arterial disease (HCC)    left fifth toe ulcer, healing  . Pneumonia   . Shortness of breath   . 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 Wears dentures   . Wears glasses    Past Surgical History:  Procedure Laterality Date  . A/V FISTULAGRAM Left 07/20/2018   Procedure: A/V FISTULAGRAM;  Surgeon: [name], MD;  Location: [place] [facility];  Service: Cardiovascular;  Laterality: Left;  . ABDOMINAL AORTOGRAM N/A 02/25/2019   Procedure: ABDOMINAL AORTOGRAM;  Surgeon: Elam Dutch, MD;  Location: [place] [facility];  Service: Cardiovascular;  Laterality: N/A;  . ABDOMINAL AORTOGRAM W/LOWER EXTREMITY Bilateral 07/20/2018   Procedure: ABDOMINAL AORTOGRAM W/LOWER EXTREMITY;  Surgeon: [name], MD;  Location: [facility] [place] [facility];  Service: Cardiovascular;  Laterality: Bilateral;  . AMPUTATION Right 09/07/2018   Procedure: Right fifth metatarsectomy;  Surgeon: [name], DPM;  Location: [place];  Service: Podiatry;  Laterality: Right;  . AMPUTATION Left 03/08/2019   Procedure: AMPUTATION  3RD AND 4TH TOES LEFT FOOT;  Surgeon: [name], DPM;  Location: [place];  Service: Podiatry;  Laterality: Left;  . AV FISTULA PLACEMENT Right 12/13/2012   Procedure: ARTERIOVENOUS (AV) FISTULA CREATION;  Surgeon: [name], MD;  Location: [place];  Service: Vascular;  Laterality: Right;  Ultrasound guided  . AV FISTULA PLACEMENT Left 05/07/2016   Procedure: LEFT RADIOCEPHALIC ARTERIOVENOUS (AV) FISTULA CREATION;  Surgeon: [name], MD;  Location: [place];  Service: Vascular;  Laterality: Left;  . BASCILIC VEIN TRANSPOSITION Right 03/26/2016   Procedure: RIGHT BASILIC VEIN TRANSPOSITION;  Surgeon: [name], MD;  Location: [place];  Service: Vascular;  Laterality: Right;  . CARDIAC CATHETERIZATION    . CARDIAC DEFIBRILLATOR PLACEMENT     Chemical engineer  . EYE SURGERY Bilateral    Cataract  . FISTULOGRAM Left 04/22/2018   Procedure: FISTULOGRAM UPPER EXTREMITY;  Surgeon: [name], MD;  Location: [place];  Service: Vascular;   Laterality: Left;  . I&D EXTREMITY Right 07/15/2018   Procedure: IRRIGATION AND DEBRIDEMENT RIGHT FOOT;  Surgeon: [name], DPM;  Location: [place] [facility];  Service: Podiatry;  Laterality: Right;  . INCISION AND DRAINAGE ABSCESS / HEMATOMA OF BURSA / KNEE / THIGH Left 2012   "knee" (03/11/2013)  . INSERT / REPLACE / REMOVE PACEMAKER    . INSERTION OF DIALYSIS CATHETER Left 04/22/2018   Procedure: INSERTION OF DIALYSIS CATHETER;  Surgeon: [name], MD;  Location: [facility];  Service: Vascular;  Laterality: Left;  . IR FLUORO GUIDE CV LINE LEFT  07/15/2018  . IR PTA VENOUS EXCEPT DIALYSIS CIRCUIT  07/15/2018  . LOWER EXTREMITY ANGIOGRAPHY Right 07/21/2018   Procedure: LOWER EXTREMITY ANGIOGRAPHY;  Surgeon: [name], MD;  Location: [facility];  Service: Cardiovascular;  Laterality: Right;  . LOWER EXTREMITY ANGIOGRAPHY Bilateral 02/25/2019   Procedure: Lower Extremity Angiography;  Surgeon: Elam Dutch, MD;  Location: [facility];  Service: Cardiovascular;  Laterality: Bilateral;  . METATARSAL HEAD EXCISION Right 07/15/2018   Procedure: METATARSAL RESECTION;  Surgeon: [name], DPM;  Location: [facility];  Service: Podiatry;  Laterality: Right;  . MULTIPLE TOOTH EXTRACTIONS    . PERIPHERAL VASCULAR ATHERECTOMY Right 02/28/2019   Procedure: PERIPHERAL VASCULAR ATHERECTOMY;  Surgeon: Waynetta Sandy, MD;  Location: [facility];  Service: Cardiovascular;  Laterality: Right;  right tp trunk   . PERIPHERAL VASCULAR BALLOON ANGIOPLASTY Left 02/25/2019   Procedure: PERIPHERAL VASCULAR BALLOON ANGIOPLASTY;  Surgeon: Elam Dutch, MD;  Location: [facility];  Service: Cardiovascular;  Laterality: Left;  tibial peroneal trunk and PT  . PERIPHERAL VASCULAR INTERVENTION Right 07/21/2018   Procedure: PERIPHERAL VASCULAR INTERVENTION;  Surgeon: [name], MD;  Location: [facility] [facility];  Service: Cardiovascular;  Laterality: Right;   peroneal stents  . REVISON OF ARTERIOVENOUS FISTULA Right 4/85/4627   Procedure: Plication OF Right Arm ARTERIOVENOUS FISTULA;  Surgeon: Elam Dutch, MD;  Location: [facility];  Service: Vascular;  Laterality: Right;  . REVISON OF ARTERIOVENOUS FISTULA Left 04/22/2018   Procedure: REVISION OF RADIOCEPHALIC ARTERIOVENOUS FISTULA;  Surgeon: [name], MD;  Location: [place];  Service: Vascular;  Laterality: Left;  . SHUNTOGRAM N/A 05/31/2013   Procedure: Earney Mallet;  Surgeon: [name], MD;  [facility];  Service: Cardiovascular;  Laterality: N/A;  . UPPER EXTREMITY VENOGRAPHY Right 07/23/2018   Procedure: UPPER EXTREMITY VENOGRAPHY;  Surgeon: [name], MD;  Location: [place] [facility];  Service: Cardiovascular;  Laterality: Right;  . WOUND DEBRIDEMENT Right 07/17/2018   Procedure: Wound Debridement; Closure Filleted toe flap Right Foot;  Surgeon: [name], DPM;  Location: [place];  Service: Podiatry;  Laterality: Right;  . WOUND DEBRIDEMENT Right 09/07/2018   Procedure: Debridement Right Foot Wound, application of wound vac;  Surgeon: [name], DPM;  Location: [place];  Service: Podiatry;  Laterality: Right;  . WOUND DEBRIDEMENT Right  03/08/2019   Procedure: DEBRIDEMENT WOUND RIGHT FOOT;  Surgeon: [name], DPM;  Location: [place];  Service: Podiatry;  Laterality: Right;     A IV Location/Drains/Wounds Patient Lines/Drains/Airways Status   Active Line/Drains/Airways    Name:   Placement date:   Placement time:   Site:   Days:   Peripheral IV 07/22/18 Right Hand   07/22/18    1752    Hand   233   Peripheral IV 03/12/19 Right;Anterior;Lateral Forearm   03/12/19    1445    Forearm   less than 1   Fistula / Graft Right Upper arm Arteriovenous fistula   10/30/15    1340    Upper arm   1229   Fistula / Graft Right Upper arm Arteriovenous fistula   03/26/16    0918    Upper arm   1081   Fistula / Graft Left Forearm Arteriovenous fistula    05/07/16    1056    Forearm   1039   Fistula / Graft Left Forearm   -    -    Forearm      Hemodialysis Catheter Right   03/15/13    1631    Internal jugular   2188   Hemodialysis Catheter Left Subclavian   -    -    Subclavian      Sheath 07/21/18 Left Arterial;Femoral   07/21/18    1036    Arterial;Femoral   234   Sheath 07/21/18 Right Arterial   07/21/18    1105    Arterial   234   Incision (Closed) 10/30/15 Arm Right   10/30/15    1337     1229   Incision (Closed) 03/26/16 Arm Right   03/26/16    0923     1081   Incision (Closed) 05/07/16 Arm Left   05/07/16    1056     1039   Incision (Closed) 04/22/18 Chest Left   04/22/18    1341     324   Incision (Closed) 04/22/18 Arm Left   04/22/18    1341     324   Incision (Closed) 07/15/18 Foot Right   07/15/18    1840     240   Incision (Closed) 07/17/18 Foot Right   07/17/18    0835     238   Incision (Closed) 09/07/18 Foot Right   09/07/18    0819     186   Incision (Closed) 03/08/19 Foot Right   03/08/19    0752     4   Incision (Closed) 03/08/19 Foot Left   03/08/19    0824     4   Wound / Incision (Open or Dehisced) 02/23/19 Non-pressure wound Toe (Comment  which one) Left seeping brown/green drainage; malodorous   02/23/19    1200    Toe (Comment  which one)   17   Wound / Incision (Open or Dehisced) 02/25/19 Diabetic ulcer Foot Right;Lateral   02/25/19    0500    Foot   15          Intake/Output Last 24 hours No intake or output data in the 24 hours ending 03/12/19 1815  Labs/Imaging Results for orders placed or performed during the hospital encounter of 03/12/19 (from the past 48 hour(s))  Comprehensive metabolic panel     Status: Abnormal   Collection Time: 03/12/19 12:16 PM  Result Value Ref Range   Sodium  133 (L) 135 - 145 mmol/L   Potassium 3.9 3.5 - 5.1 mmol/L   Chloride 94 (L) 98 - 111 mmol/L   CO2 22 22 - 32 mmol/L   Glucose, Bld 87 70 - 99 mg/dL   BUN 25 (H) 8 - 23 mg/dL   Creatinine, Ser 5.97 (H) 0.61 - 1.24 mg/dL    Calcium 9.4 8.9 - 10.3 mg/dL   Total Protein 8.1 6.5 - 8.1 g/dL   Albumin 3.0 (L) 3.5 - 5.0 g/dL   AST 17 15 - 41 U/L   ALT 6 0 - 44 U/L   Alkaline Phosphatase 66 38 - 126 U/L   Total Bilirubin 0.9 0.3 - 1.2 mg/dL   GFR calc non Af Amer 9 (L) >60 mL/min   GFR calc Af Amer 10 (L) >60 mL/min   Anion gap 17 (H) 5 - 15    Comment: Performed at [facility], [place] [address], [place], [place] [address]  CBC with Differential     Status: Abnormal   Collection Time: 03/12/19 12:16 PM  Result Value Ref Range   WBC 9.9 4.0 - 10.5 K/uL   RBC 3.84 (L) 4.22 - 5.81 MIL/uL   Hemoglobin 11.7 (L) 13.0 - 17.0 g/dL   HCT 39.2 39.0 - 52.0 %   MCV 102.1 (H) 80.0 - 100.0 fL   MCH 30.5 26.0 - 34.0 pg   MCHC 29.8 (L) 30.0 - 36.0 g/dL   RDW 16.9 (H) 11.5 - 15.5 %   Platelets 356 150 - 400 K/uL   nRBC 0.0 0.0 - 0.2 %   Neutrophils Relative % 80 %   Neutro Abs 7.9 (H) 1.7 - 7.7 K/uL   Lymphocytes Relative 8 %   Lymphs Abs 0.8 0.7 - 4.0 K/uL   Monocytes Relative 10 %   Monocytes Absolute 1.0 0.1 - 1.0 K/uL   Eosinophils Relative 1 %   Eosinophils Absolute 0.1 0.0 - 0.5 K/uL   Basophils Relative 0 %   Basophils Absolute 0.0 0.0 - 0.1 K/uL   Immature Granulocytes 1 %   Abs Immature Granulocytes 0.05 0.00 - 0.07 K/uL    Comment: Performed at [facility], [place] [address], [place], [place] [address]  Protime-INR     Status: Abnormal   Collection Time: 03/12/19 12:16 PM  Result Value Ref Range   Prothrombin Time 15.5 (H) 11.4 - 15.2 seconds   INR 1.2 0.8 - 1.2    Comment: (NOTE) INR goal varies based on device and disease states. Performed at [facility], [place] [address], [place], [place] [address]   Lactic acid, plasma     Status: None   Collection Time: 03/12/19  3:00 PM  Result Value Ref Range   Lactic Acid, Venous 1.6 0.5 - 1.9 mmol/L    Comment: Performed at [facility] [address], [place], [place] [address]  Hemoglobin A1c     Status: None   Collection Time:  03/12/19  5:45 PM  Result Value Ref Range   Hgb A1c MFr Bld 5.2 4.8 - 5.6 %    Comment: (NOTE) Pre diabetes:          5.7%-6.4% Diabetes:              >6.4% Glycemic control for   <7.0% adults with diabetes    Mean Plasma Glucose 102.54 mg/dL    Comment: Performed at [facility] [address], [place], [place] [address]  Dg Foot Complete Left  Result Date: 03/12/2019 CLINICAL DATA:  Discoloration of the left second toe. History of prior third and fourth toe amputation. EXAM: LEFT FOOT - COMPLETE 3+ VIEW COMPARISON:  Plain films the left 02/22/2019. FINDINGS: The third and fourth toes have been amputated since the prior examination. Dressing is in place. No bony destructive change or periosteal reaction. No acute abnormality. First MTP osteoarthritis noted. No soft tissue gas or radiopaque foreign body. Atherosclerosis is seen. 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 Electronically Signed   By: Inge Rise M.D.   On: 03/12/2019 14:14    Pending Labs Unresulted Labs (From admission, onward)    Start     Ordered   03/13/19 6967  Basic metabolic panel  Tomorrow morning,   R     03/12/19 1708   03/13/19 0500  CBC  Tomorrow morning,   R     03/12/19 1708   03/12/19 1718  SARS CORONAVIRUS 2 Nasal Swab Aptima Multi Swab  (Symptomatic/High Risk of Exposure Patients Labs with Precautions)  Once,   STAT    Question Answer Comment  Is this test for diagnosis or screening Screening   Symptomatic for COVID-19 as defined by CDC No   Hospitalized for COVID-19 No   Admitted to ICU for COVID-19 No   Previously tested for COVID-19 Yes   Resident in a congregate (group) care setting No   Employed in healthcare setting No      03/12/19 1731   03/12/19 1702  Sedimentation rate  Once,   STAT     03/12/19 1708   03/12/19 1702  C-reactive protein  Once,   STAT     03/12/19 1708   03/12/19 1702  Prealbumin  Once,   STAT      03/12/19 1708   03/12/19 1307  Blood culture (routine x 2)  BLOOD CULTURE X 2,   STAT     03/12/19 1307          Vitals/Pain Today's Vitals   03/12/19 1149 03/12/19 1239 03/12/19 1509 03/12/19 1600  BP: 101/63  98/70 123/77  Pulse: 100  84 88  Resp: 16  16   Temp: 98.1 F (36.7 C)     TempSrc: Oral     SpO2: 100% 100% 100% 98%    Isolation Precautions No active isolations  Medications Medications  vancomycin (VANCOCIN) 1,500 mg in sodium chloride 0.9 % 500 mL IVPB (1,500 mg Intravenous New Bag/Given 03/12/19 1750)  aspirin EC tablet 81 mg (has no administration in time range)  oxyCODONE (Oxy IR/ROXICODONE) immediate release tablet 5 mg (has no administration in time range)  midodrine (PROAMATINE) tablet 10 mg (has no administration in time range)  rosuvastatin (CRESTOR) tablet 10 mg (has no administration in time range)  calcitRIOL (ROCALTROL) capsule 0.5 mcg (has no administration in time range)  cinacalcet (SENSIPAR) tablet 30 mg (has no administration in time range)  ferric citrate (AURYXIA) tablet 420 mg (has no administration in time range)  feeding supplement (PRO-STAT SUGAR FREE 64) liquid 30 mL (has no administration in time range)  feeding supplement (NEPRO CARB STEADY) liquid 237 mL (has no administration in time range)  multivitamin (RENA-VIT) tablet 1 tablet (has no administration in time range)  collagenase (SANTYL) ointment 1 application (has no administration in time range)  acetaminophen (TYLENOL) tablet 650 mg (has no administration in time range)    Or  acetaminophen (TYLENOL) suppository 650 mg (has  no administration in time range)  zolpidem (AMBIEN) tablet 5 mg (has no administration in time range)  sorbitol 70 % solution 30 mL (has no administration in time range)  docusate sodium (ENEMEEZ) enema 283 mg (has no administration in time range)  ondansetron (ZOFRAN) tablet 4 mg (has no administration in time range)    Or  ondansetron (ZOFRAN) injection 4  mg (has no administration in time range)  camphor-menthol (SARNA) lotion 1 application (has no administration in time range)    And  hydrOXYzine (ATARAX/VISTARIL) tablet 25 mg (has no administration in time range)  calcium carbonate (dosed in mg elemental calcium) suspension 500 mg of elemental calcium (has no administration in time range)  feeding supplement (NEPRO CARB STEADY) liquid 237 mL (has no administration in time range)  heparin injection 5,000 Units (has no administration in time range)    Mobility walks with person assist High fall risk   Focused Assessments    R Recommendations: See Admitting Provider Note  Report given to: 54M charge nurse  Additional Notes:

## 2019-03-12 NOTE — Progress Notes (Addendum)
Subjective:  Patient ID: Timothy Faulkner, male    DOB: Aug 25, 1945,  MRN: [id number]  Chief Complaint  Patient presents with  . Routine Post Op    DOS 7.28.2020 Amputation toe MPJ 3,4 Left.    DOS: 03/08/2019 Procedure: Amputation Left 3rd/4th toes MPJ; Right foot debridement, application of skin graft substitute.  73 y.o. male returns for post-op check. Denies post-op issues or concerns.  Taking antibiotic as directed did not have significant pain after procedure.  Review of Systems: Negative except as noted in the HPI. Denies N/V/F/Ch.  Past Medical History:  Diagnosis Date  . Anemia   . Arthritis    Gout  . Automatic implantable cardioverter-defibrillator in situ    [employer]  . CHF   . ESRD on dialysis [facility])    [name] 03/11/2013 (03/11/2013) dialysis M/W/F  . Gangrene ([place])    left foot  . GERD (gastroesophageal reflux disease)   . Gout    "once a year"  . Heart murmur   . HYPERCHOLESTEROLEMIA, MIXED   . HYPERTENSION   . Macular degeneration   . Osteomyelitis ([place])    right foot  . 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 Pacemaker   . Peripheral arterial disease (HCC)    left fifth toe ulcer, healing  . Pneumonia   . Shortness of breath   . 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 Wears dentures   . Wears glasses     Current Outpatient Medications:  .  acetaminophen (TYLENOL) 500 MG tablet, Take 500 mg by mouth every 6 (six) hours as needed for mild pain or headache., Disp: , Rfl:  .  Amino Acids-Protein Hydrolys (FEEDING SUPPLEMENT, PRO-STAT SUGAR FREE 64,) LIQD, Take 30 mLs by mouth 2 (two) times daily., Disp: 887 mL, Rfl: 0 .  aspirin EC 81 MG tablet, Take 81 mg by mouth daily., Disp: , Rfl:  .  AURYXIA 1 GM 210 MG(Fe) tablet, Take 420 mg by mouth See admin instructions. Take 2 tablets (420 mg) by mouth with each meal and with each snack, Disp: , Rfl:  .  calcitRIOL (ROCALTROL) 0.5 MCG capsule, Take 1  capsule (0.5 mcg total) by mouth every Monday, Wednesday, and Friday with hemodialysis., Disp: , Rfl:  .  cephALEXin (KEFLEX) 500 MG capsule, Take 1 capsule (500 mg total) by mouth 2 (two) times daily., Disp: 14 capsule, Rfl: 0 .  clopidogrel (PLAVIX) 75 MG tablet, Take 1 tablet (75 mg total) by mouth daily with breakfast., Disp: 30 tablet, Rfl: 0 .  colchicine 0.6 MG tablet, Take 0.6 mg by mouth daily as needed (for gout flare ups). , Disp: , Rfl: 1 .  collagenase (SANTYL) ointment, Apply 1 application topically daily. Apply to right foot wound daily cover with dry dressing., Disp: 15 g, Rfl: 5 .  gabapentin (NEURONTIN) 100 MG capsule, TAKE 1 CAPSULE BY MOUTH AT BEDTIME., Disp: 90 capsule, Rfl: 0 .  midodrine (PROAMATINE) 10 MG tablet, Take 10 mg by mouth every Monday, Wednesday, and Friday with hemodialysis. , Disp: , Rfl: 6 .  multivitamin (RENA-VIT) TABS tablet, Take 1 tablet by mouth at bedtime., Disp: 30 tablet, Rfl: 0 .  Nutritional Supplements (FEEDING SUPPLEMENT, NEPRO CARB STEADY,) LIQD, Take 237 mLs by mouth 2 (two) times daily between meals., Disp: 237 mL, Rfl: 12 .  oxyCODONE (ROXICODONE) 5 MG immediate release tablet, Take 1 tablet (5 mg total) by mouth every 8 (eight) hours as  needed for severe pain., Disp: 20 tablet, Rfl: 0 .  rosuvastatin (CRESTOR) 10 MG tablet, Take 1 tablet (10 mg total) by mouth daily at 6 PM., Disp: 30 tablet, Rfl: 0 .  SENSIPAR 30 MG tablet, Take 30 mg by mouth every other day. In the morning, Disp: , Rfl:  .  cefTAZidime 1 g in dextrose 5 % 50 mL, Inject 1 g into the vein every dialysis for up to 6 doses. Administer after dialysis or as per nephrologist., Disp: 6 g, Rfl: 0 .  Wound Dressings (MEDIHONEY WOUND/BURN DRESSING) GEL, Apply to affected are 3 times a week, and cover with sterile dressing., Disp: 15 mL, Rfl: 5  Social History   Tobacco Use  Smoking Status Former Smoker  . Packs/day: 0.75  . Years: 30.00  . Pack years: 22.50  . Types: Cigarettes   . Quit date: 11/25/1992  . Years since quitting: 26.3  Smokeless Tobacco Never Used    No Known Allergies Objective:   Vitals:   03/10/19 1138  Temp: 97.8 F (36.6 C)   There is no height or weight on file to calculate BMI. Constitutional Well developed. Well nourished.  Vascular Foot warm and well perfused right, left foot warm to cool. Capillary refill normal to all remaining digits.   Neurologic Normal speech. Oriented to person, place, and time. Epicritic sensation to light touch grossly present bilaterally.  Dermatologic Right foot wound with skin graft substitute intact and staple material. Left foot wound bed viable without necrosis. Wound bed does appear very dry. No active bleeding. Retention sutures intact. No warmth, erythema, no signs of acute infection. Left 2nd toe lateral surface with slight maceration.  Orthopedic: Tenderness to palpation noted about the surgical sites.   Radiographs: None  Results for orders placed or performed during the hospital encounter of 03/08/19  Aerobic/Anaerobic Culture (surgical/deep wound)     Status: None (Preliminary result)   Collection Time: 03/08/19  8:15 AM   Specimen: Soft Tissue, Other  Result Value Ref Range Status   Specimen Description FOOT LEFT  Final   Special Requests DEEP SOFT TISSUE CULTURE  Final   Gram Stain   Final    RARE WBC PRESENT, PREDOMINANTLY PMN FEW GRAM NEGATIVE RODS Performed at [facility], [place] [address] [place], [place] [address]    Culture   Final    MODERATE ENTEROBACTER CLOACAE NO ANAEROBES ISOLATED; CULTURE IN PROGRESS FOR 5 DAYS    Report Status PENDING  Incomplete   Organism ID, Bacteria ENTEROBACTER CLOACAE  Final      Susceptibility   Enterobacter cloacae - MIC*    CEFAZOLIN >=64 RESISTANT Resistant     CEFEPIME <=1 SENSITIVE Sensitive     CEFTAZIDIME <=1 SENSITIVE Sensitive     CEFTRIAXONE <=1 SENSITIVE Sensitive     CIPROFLOXACIN <=0.25 SENSITIVE Sensitive     GENTAMICIN  <=1 SENSITIVE Sensitive     IMIPENEM <=0.25 SENSITIVE Sensitive     TRIMETH/SULFA <=20 SENSITIVE Sensitive     PIP/TAZO <=4 SENSITIVE Sensitive     * MODERATE ENTEROBACTER CLOACAE    Assessment:   1. Gangrene of toe of left foot ([place])   2. Diabetic ulcer of other part of right foot associated with diabetes mellitus due to underlying condition, limited to breakdown of skin ([place])   3. PAD (peripheral artery disease) (HCC)   4. Coagulation defect ([place])    Plan:  Patient was evaluated and treated and all questions answered.  Right Foot Chronic ulceration  status post debridement and skin graft substitute application -Wound appears to be healing well.  Graft intact.  Hydrogel and dry sterile dressing applied.  Dressing to be left intact.  Change only if strikethrough occurs. -We will send updated orders with home health care  PAD, Gangrene Left Foot status post left third and fourth toe amputation -Wound bed does appear viable with intact retention sutures. Discussed likely delayed healing due to decreased perfusion.  Will closely monitor.  -Advised should symptoms of worsening discoloration especially to the second toe to be noted to call or present to the ED.  Leave dressing intact to only be changed by home health care.  Will provide updated orders for home health care -Wound copiously cleansed.  Collagen gel and dry sterile dressing applied -Wound culture reviewed.  Based upon sensitivities will prescribe ceftazidime with dialysis.  No follow-ups on file.

## 2019-03-12 NOTE — ED Provider Notes (Signed)
Medical screening examination/treatment/procedure(s) were conducted as a shared visit with non-physician practitioner(s) and myself.  I personally evaluated the patient during the encounter.    Patient has had peripheral revascularization surgeries in July.  He had amputation of the third and fourth toes of the left foot July 28.  He felt like he was doing pretty well.  He reports his daughter changed his wounds today and noted that his second toe was dark in appearance and had concern for infection or complication.  Patient denies you experiencing any pain or change that he is aware of.  Patient is alert and appropriate.  Mental status is clear.  No respiratory distress.  Examination of the foot shows the second left toe to be very dusky in appearance.  He has an open wound where the amputations or done.  There is no purulent drainage or discharge from the wound.  Concern for ischemia or loss of viability of the second toe potentially.  I agree with plan of management.   Charlesetta Shanks, MD 03/12/19 [phone]

## 2019-03-12 NOTE — ED Triage Notes (Signed)
To ED for eval of left foot toe 'turning dark'. Pt states his daughter was changing his dressings - when she noticed a toe turning dark. She called the pts doctor and told to come to ED. Pt recently had 2 toes removed.

## 2019-03-12 NOTE — H&P (Signed)
History and Physical    Timothy Faulkner VWU:[id number] DOB: 10-01-1945 DOA: 03/12/2019  PCP: Timothy Forward, MD Consultants:  Timothy Faulkner - podiatry; Timothy Faulkner - EP; Timothy Faulkner - nephrology; Timothy Faulkner - vascular Patient coming from:  Home - lives with wife and daughter; Timothy Faulkner: Daughter, [phone]  Chief Complaint: Wound infection  HPI: Timothy Faulkner is a 73 y.o. male with medical history significant of DM; PAD s/p revascularization procedure on 7/17 and atherectomy on 7/20 with subsequent 3/4 toe amputation on the L foot 7/28; HTN; HLD; ESRD on MWF HD; AICD placement; and CHF presenting with R 2nd toe wound infection.  He had toes amputated on 7/28.  Thursday he went to podiatry for followup.  He was home today and his daughter was changing the dressing on his foot and was concerned and so called Dr. March Faulkner.  Dr. March Faulkner recommended he come to the ER because his foot and 2nd toe had turned dark. He is not really having pain.  He has not noticed drainage, maybe some.  No fevers.     ED Course:  L toe infection, 2nd toe, pedal pulses intact. Likely nothing to do from vascular standpoint. Vascular will see to determine if amputation is needed and whether vascular or podiatry will do it.  TRH to admit for med management, ESRD.  Review of Systems: As per HPI; otherwise review of systems reviewed and negative.   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   Pacemaker    Peripheral arterial disease (HCC)     left fifth toe ulcer, healing   Pneumonia    Shortness of breath    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)    left fifth toe   Wears dentures    Wears glasses     Past Surgical History:  Procedure Laterality Date   A/V FISTULAGRAM Left 07/20/2018   Procedure: A/V FISTULAGRAM;  Surgeon: [name], MD;  Location: [place] [facility];  Service: Cardiovascular;  Laterality: Left;   ABDOMINAL AORTOGRAM N/A 02/25/2019   Procedure: ABDOMINAL AORTOGRAM;  Surgeon: Elam Dutch, MD;  Location: [place] [facility];  Service: Cardiovascular;  Laterality: N/A;   ABDOMINAL AORTOGRAM W/LOWER EXTREMITY Bilateral 07/20/2018   Procedure: ABDOMINAL AORTOGRAM W/LOWER EXTREMITY;  Surgeon: [name], MD;  Location: [place] [facility];  Service: Cardiovascular;  Laterality: Bilateral;   AMPUTATION Right 09/07/2018   Procedure: Right fifth metatarsectomy;  Surgeon: [name], DPM;  Location: [place];  Service: Podiatry;  Laterality: Right;   AMPUTATION Left 03/08/2019   Procedure: AMPUTATION  3RD AND 4TH TOES LEFT FOOT;  Surgeon: [name], DPM;  Location: [place];  Service: Podiatry;  Laterality: Left;   AV FISTULA PLACEMENT Right 12/13/2012   Procedure: ARTERIOVENOUS (AV) FISTULA CREATION;  Surgeon: [name], MD;  Location: [place];  Service: Vascular;  Laterality: Right;  Ultrasound guided   AV FISTULA PLACEMENT Left 05/07/2016   Procedure: LEFT RADIOCEPHALIC ARTERIOVENOUS (AV) FISTULA CREATION;  Surgeon: [name], MD;  Location: [place];  Service: Vascular;  Laterality: Left;   Media Right 03/26/2016   Procedure: RIGHT BASILIC VEIN TRANSPOSITION;  Surgeon: [name], MD;  Location: [place];  Service: Vascular;  Laterality: Right;   CARDIAC CATHETERIZATION     CARDIAC DEFIBRILLATOR PLACEMENT     [employer]   EYE SURGERY Bilateral    Cataract   FISTULOGRAM Left 04/22/2018   Procedure: FISTULOGRAM UPPER  EXTREMITY;  Surgeon: [name], MD;  Location: [place];  Service: Vascular;  Laterality: Left;   I&D EXTREMITY Right 07/15/2018   Procedure: IRRIGATION AND DEBRIDEMENT RIGHT FOOT;  Surgeon: [name], DPM;  Location: [place];  Service: Podiatry;  Laterality: Right;   INCISION AND DRAINAGE ABSCESS / HEMATOMA OF BURSA / KNEE / THIGH Left 2012   "knee" (03/11/2013)   INSERT / REPLACE / REMOVE PACEMAKER     INSERTION OF DIALYSIS CATHETER Left 04/22/2018   Procedure: INSERTION OF DIALYSIS CATHETER;  Surgeon: [name], MD;  Location: [place];  Service: Vascular;  Laterality: Left;   IR FLUORO GUIDE CV LINE LEFT  07/15/2018   IR PTA VENOUS EXCEPT DIALYSIS CIRCUIT  07/15/2018   LOWER EXTREMITY ANGIOGRAPHY Right 07/21/2018   Procedure: LOWER EXTREMITY ANGIOGRAPHY;  Surgeon: [name], MD;  Location: [place] [facility];  Service: Cardiovascular;  Laterality: Right;   LOWER EXTREMITY ANGIOGRAPHY Bilateral 02/25/2019   Procedure: Lower Extremity Angiography;  Surgeon: Elam Dutch, MD;  Location: [place] [facility];  Service: Cardiovascular;  Laterality: Bilateral;   METATARSAL HEAD EXCISION Right 07/15/2018   Procedure: METATARSAL RESECTION;  Surgeon: [name], DPM;  Location: [place];  Service: Podiatry;  Laterality: Right;   MULTIPLE TOOTH EXTRACTIONS     PERIPHERAL VASCULAR ATHERECTOMY Right 02/28/2019   Procedure: PERIPHERAL VASCULAR ATHERECTOMY;  Surgeon: Waynetta Sandy, MD;  Location: [place] [facility];  Service: Cardiovascular;  Laterality: Right;  right tp trunk    PERIPHERAL VASCULAR BALLOON ANGIOPLASTY Left 02/25/2019   Procedure: PERIPHERAL VASCULAR BALLOON ANGIOPLASTY;  Surgeon: Elam Dutch, MD;  Location: [place] [facility];  Service: Cardiovascular;  Laterality: Left;  tibial peroneal trunk and PT   PERIPHERAL VASCULAR INTERVENTION Right 07/21/2018   Procedure: PERIPHERAL VASCULAR INTERVENTION;  Surgeon: [name],  MD;  Location: [place] [facility];  Service: Cardiovascular;  Laterality: Right;  peroneal stents   REVISON OF ARTERIOVENOUS FISTULA Right 7/56/4332   Procedure: Plication OF Right Arm ARTERIOVENOUS FISTULA;  Surgeon: Elam Dutch, MD;  Location: [place];  Service: Vascular;  Laterality: Right;   REVISON OF ARTERIOVENOUS FISTULA Left 04/22/2018   Procedure: REVISION OF RADIOCEPHALIC ARTERIOVENOUS FISTULA;  Surgeon: [name], MD;  Location: [place];  Service: Vascular;  Laterality: Left;   SHUNTOGRAM N/A 05/31/2013   Procedure: [place] Mal[place];  Surgeon: [name], MD;  Location: [facility];  Service: Cardiovascular;  Laterality: N/A;   UPPER EXTREMITY VENOGRAPHY Right 07/23/2018   Procedure: UPPER EXTREMITY VENOGRAPHY;  Surgeon: [name], MD;  Location: [place] [facility];  Service: Cardiovascular;  Laterality: Right;   WOUND DEBRIDEMENT Right 07/17/2018   Procedure: Wound Debridement; Closure Filleted toe flap Right Foot;  Surgeon: [name], DPM;  Location: [place];  Service: Podiatry;  Laterality: Right;   WOUND DEBRIDEMENT Right 09/07/2018  Procedure: Debridement Right Foot Wound, application of wound vac;  Surgeon: [name], DPM;  Location: [place];  Service: Podiatry;  Laterality: Right;   WOUND DEBRIDEMENT Right 03/08/2019   Procedure: DEBRIDEMENT WOUND RIGHT FOOT;  Surgeon: [name], DPM;  Location: [place];  Service: Podiatry;  Laterality: Right;    Social History   Socioeconomic History   Marital status: Married    Spouse name: [name]   Number of children: 2   Years of education: 13   Highest education level: Not on file  Occupational History   Occupation: diasbled  Scientist, product/process development strain: Not on file   Food insecurity    Worry: Not on file    Inability: Not on file   Transportation needs    Medical: Not on file    Non-medical: Not on file  Tobacco Use   Smoking status: Former Smoker     Packs/day: 0.75    Years: 30.00    Pack years: 22.50    Types: Cigarettes    Quit date: 11/25/1992    Years since quitting: 26.3   Smokeless tobacco: Never Used  Substance and Sexual Activity   Alcohol use: Not Currently   Drug use: Not Currently    Types: Marijuana    Comment: last use 10 years ago   Sexual activity: Yes  Lifestyle   Physical activity    Days per week: Not on file    Minutes per session: Not on file   Stress: Not on file  Relationships   Social connections    Talks on phone: Not on file    Gets together: Not on file    Attends religious service: Not on file    Active member of club or organization: Not on file    Attends meetings of clubs or organizations: Not on file    Relationship status: Not on file   Intimate partner violence    Fear of current or ex partner: Not on file    Emotionally abused: Not on file    Physically abused: Not on file    Forced sexual activity: Not on file  Other Topics Concern   Not on file  Social History Narrative   Pt lives in single story home with his wife and daughter   Has 2 children   Some college education   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 Authorizing Provider  acetaminophen (TYLENOL) 500 MG tablet Take 500 mg by mouth every 6 (six) hours as needed for mild pain or headache.    [provider]  Amino Acids-Protein Hydrolys (FEEDING SUPPLEMENT, PRO-STAT SUGAR FREE 64,) LIQD Take 30 mLs by mouth 2 (two) times daily. 03/03/19   Hongalgi, Lenis Dickinson, MD  aspirin EC 81 MG tablet Take 81 mg by mouth daily.    [provider]  AURYXIA 1 GM 210 MG(Fe) tablet Take 420 mg by mouth See admin instructions. Take 2 tablets (420 mg) by mouth with each meal and with each snack 11/12/16   [provider]   calcitRIOL (ROCALTROL) 0.5 MCG capsule Take 1 capsule (0.5 mcg total) by mouth every Monday, Wednesday, and Friday with hemodialysis. 03/04/19   Hongalgi, Lenis Dickinson, MD  cefTAZidime 1 g  in dextrose 5 % 50 mL Inject 1 g into the vein every dialysis for up to 6 doses. Administer after dialysis or as per nephrologist. 03/10/19   [name], DPM  cephALEXin (KEFLEX) 500 MG capsule Take 1 capsule (500 mg total) by mouth 2 (two) times daily. 03/08/19   [name], DPM  clopidogrel (PLAVIX) 75 MG tablet Take 1 tablet (75 mg total) by mouth daily with breakfast. 07/23/18   Kayleen Memos, DO  colchicine 0.6 MG tablet Take 0.6 mg by mouth daily as needed (for gout flare ups).  02/26/18   [provider]  collagenase (SANTYL) ointment Apply 1 application topically daily. Apply to right foot wound daily cover with dry dressing. 07/27/18   [name], DPM  gabapentin (NEURONTIN) 100 MG capsule TAKE 1 CAPSULE BY MOUTH AT BEDTIME. 10/28/18   Cameron Sprang, MD  midodrine (PROAMATINE) 10 MG tablet Take 10 mg by mouth every Monday, Wednesday, and Friday with hemodialysis.  04/26/18   [provider]  multivitamin (RENA-VIT) TABS tablet Take 1 tablet by mouth at bedtime. 07/23/18   Kayleen Memos, DO  Nutritional Supplements (FEEDING SUPPLEMENT, NEPRO CARB STEADY,) LIQD Take 237 mLs by mouth 2 (two) times daily between meals. 03/03/19   Hongalgi, Lenis Dickinson, MD  oxyCODONE (ROXICODONE) 5 MG immediate release tablet Take 1 tablet (5 mg total) by mouth every 8 (eight) hours as needed for severe pain. 03/08/19 03/07/20  [name], DPM  rosuvastatin (CRESTOR) 10 MG tablet Take 1 tablet (10 mg total) by mouth daily at 6 PM. 03/03/19   Hongalgi, Lenis Dickinson, MD  SENSIPAR 30 MG tablet Take 30 mg by mouth every other day. In the morning    [provider]  Wound Dressings (MEDIHONEY WOUND/BURN DRESSING) GEL Apply to affected are 3 times a week, and cover with sterile dressing. 03/10/19   [name], DPM    Physical Exam: Vitals:   03/12/19 1149 03/12/19 1239 03/12/19 1509  BP: 101/63  98/70  Pulse: 100  84  Resp: 16  16  Temp: 98.1 F (36.7 C)    TempSrc: Oral    SpO2: 100% 100% 100%      General:  Appears calm and comfortable and is NAD  Eyes:  PERRL, EOMI, normal lids, iris  ENT:  grossly normal hearing, lips & tongue, mmm  Neck:  no LAD, masses or thyromegaly  Cardiovascular:  RRR, no m/r/g. No Faulkner edema.   Respiratory:   CTA bilaterally with no wheezes/rales/rhonchi.  Normal respiratory effort.  Abdomen:  soft, NT, ND, NABS  Skin:  R foot s/p 5th toe amputation and with slowly healing wound along the lateral edge, ulceration roughly 3cm and clean base.   L foot with ?mild necrosis vs. Dried blood along the edge of the recent surgical amputation of toes 3-4, now with darkening of the 2nd toe which may extend to the lateral base of the 1st toe         Musculoskeletal:  grossly normal tone BUE/BLE, good ROM, no bony abnormality other than as described above  Psychiatric:  grossly normal mood and affect, speech fluent and appropriate, AOx3  Neurologic:  CN 2-12 grossly intact, moves all extremities in coordinated fashion    Radiological Exams on Admission: Dg Foot Complete Left  Result Date: 03/12/2019 CLINICAL DATA:  Discoloration of the left second toe. History of prior third and fourth toe amputation. EXAM: LEFT FOOT - COMPLETE 3+ VIEW COMPARISON:  Plain films the left 02/22/2019. FINDINGS: The third and fourth toes have been amputated since the prior examination. Dressing is in place. No bony destructive change or periosteal reaction. No acute abnormality. First MTP osteoarthritis noted. No soft tissue gas or radiopaque foreign body. Atherosclerosis is seen. 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 Electronically Signed   By: Inge Rise M.D.   On: 03/12/2019 14:14    EKG:  pending   Labs on Admission: I have personally reviewed the available labs and imaging studies at the time of the admission.  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 March Faulkner continue the process since he has done his amputations to date. -Negative lactate, no current concerns for sepsis -Will treat with IV Vancomycin -Faulkner wound order set utilized including labs (CRP, ESR, A1c, prealbumin, HIV, and blood cultures) and consults (CM; diabetes coordinator; peripheral vascular navigator; SW; wound care; and nutrition) -Will admit, since it appears the patient will require further surgical treatment.  Telemetry x 24 hours.  ESRD on MWF HD -Patient on chronic MWF HD -Nephrology prn order set utilized -He does not appear to be volume overloaded or otherwise in need of acute HD -Nephrology will need to be notified tomorrow of patient's need for routine HD Monday, assuming he is to remain inpatient  Chronic systolic CHF -Compensated, with volume management through HD  Chronic hypotension -On  Midodrine -Both his HTN and DM appear to have resolved, although he clearly is dealing with the long-term sequelae of these conditions     Note: This patient has been tested and was negative for the novel coronavirus COVID-19.  Will retest today using the Aptima platform.  DVT prophylaxis: Heparin SQ Code Status:  Full - confirmed with patient Family Communication: None present; patient deferred having me call them Disposition Plan:  Home once clinically improved Consults called:  Vascular surgery; will need nephrology;  CM; diabetes coordinator; peripheral vascular navigator; SW; wound care; and nutrition Admission status: Admit - It is my clinical opinion that admission to INPATIENT is reasonable and necessary because of the expectation that this patient will require hospital care that crosses at least 2 midnights to treat this condition based on the medical complexity of the problems presented.  Given the aforementioned information, the predictability of an adverse outcome is felt to be significant.    Karmen Bongo MD [facility]   How to contact the [facility] Attending or Consulting provider [place] or covering provider during after hours [place], for this patient?  1. Check the care team in [facility] and look for a)  attending/consulting TR[facility] provider listed and b) the [facility] team listed 2. Log into [email or web address] and use [place] universal password to access. If you do not have the password, please contact the hospital operator. 3. Locate the [facility] provider you are looking for under [facility] and page to a number that you can be directly reached. 4. If you still have difficulty reaching the provider, please page the [facility] (Director on Call) for the Hospitalists listed on amion for assistance.   03/12/2019, 5:13 PM

## 2019-03-12 NOTE — Telephone Encounter (Signed)
Patient's daughter [name] called on call provider with concerns of discoloration to the second toe of the left foot. Additionally patient very lethargic.  Upon discussion with on call provider recommend patient proceed to the ED. Patient is s/p revascularization procedure to the left foot on 02/25/2019. He underwent angiogram with TP trunk angioplasty. 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 Recommend vascular eval. Will follow if admitted.

## 2019-03-12 NOTE — Progress Notes (Signed)
Pharmacy Antibiotic Note  Timothy Faulkner is a 73 y.o. male admitted on 03/12/2019 with dull pain to L foot. Patient has a history of vascular issues. Starting empiric antibiotics. Patient is ESRD on HD.    Plan: -Vancomycin 1500 mg IV x1 then 750 mg IV x qMon/Wed/Fri -Monitor dialysis schedule, levels as needed   Temp (24hrs), Avg:98.1 F (36.7 C), Min:98.1 F (36.7 C), Max:98.1 F (36.7 C)  Recent Labs  Lab 03/08/19 0546 03/12/19 1216 03/12/19 1500  WBC 8.1 9.9  --   CREATININE 7.00* 5.97*  --   LATICACIDVEN  --   --  1.6     Antimicrobials this admission: 8/1 vancomycin >  Dose adjustments this admission: N/A  Microbiology results: 8/1 blood cx: 8/1 Covid-19:   Harvel Quale 03/12/2019 6:01 PM

## 2019-03-12 NOTE — Consult Note (Signed)
[facility]  Patient name: Timothy Faulkner MRN: [id number] DOB: September 06, 1945 Sex: male  REASON FOR VISIT: Consulted for evaluation of poorly healing left third and fourth toe amputation  HPI: CLENT DAMORE is a 73 y.o. male well-known to our service with a history of peripheral vascular occlusive disease.  Longstanding hemodialysis.  He has had difficulty with ulceration on both lower extremities.  He has severe tibial disease.  He had undergone a recent angioplasty of his tibial vessels.  On 02/25/2019 he had an intervention by Dr. Oneida Alar.  He underwent successful angioplasty of his left tibioperoneal trunk and posterior tibial artery.  He had chronic occlusion of the left anterior tibial and peroneal arteries more distally.  He then was taken back to the angios suite by Dr. Donzetta Matters on 02/28/2019 where he underwent angioplasty of the right tibioperoneal trunk and posterior tibial artery.  On 03/08/2019 he underwent amputation of his left third and fourth toes with [name].  He presents to the emergency room with poorly healing amputations on the side.  Past Medical History:  Diagnosis Date  . Anemia   . Arthritis    Gout  . Automatic implantable cardioverter-defibrillator in situ    [employer]  . CHF   . ESRD on dialysis [facility])    [name] 03/11/2013 (03/11/2013) dialysis M/W/F  . Gangrene ([place])    left foot  . GERD (gastroesophageal reflux disease)   . Gout    "once a year"  . Heart murmur   . HYPERCHOLESTEROLEMIA, MIXED   . HYPERTENSION   . Macular degeneration   . Osteomyelitis ([place])    right foot  . 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 Pacemaker   . Peripheral arterial disease (HCC)    left fifth toe ulcer, healing  . Pneumonia   . Shortness of breath   . 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 Wears dentures   . Wears glasses     Family History   Problem Relation Age of Onset  . Heart disease Father   . CAD Father     SOCIAL HISTORY: Social History   Tobacco Use  . Smoking status: Former Smoker    Packs/day: 0.75    Years: 30.00    Pack years: 22.50    Types: Cigarettes    Quit date: 11/25/1992    Years since quitting: 26.3  . Smokeless tobacco: Never Used  Substance Use Topics  . Alcohol use: Not Currently    No Known Allergies  Current Facility-Administered Medications  Medication Dose Route Frequency Provider Last Rate Last Dose  . acetaminophen (TYLENOL) tablet 650 mg  650 mg Oral Q6H PRN Karmen Bongo, MD       Or  . acetaminophen (TYLENOL) suppository 650 mg  650 mg Rectal Q6H PRN Karmen Bongo, MD      . Derrill Memo ON 03/13/2019] aspirin EC tablet 81 mg  81 mg Oral Daily Karmen Bongo, MD      . Derrill Memo ON 03/14/2019] calcitRIOL (ROCALTROL) capsule 0.5 mcg  0.5 mcg Oral Q M,W,F-HD Karmen Bongo, MD      . calcium carbonate (dosed in mg elemental calcium) suspension 500 mg of elemental calcium  500 mg of elemental calcium Oral Q6H PRN Karmen Bongo, MD      . camphor-menthol [facility]) lotion 1 application  1 application Topical J8A PRN Karmen Bongo, MD  And  . hydrOXYzine (ATARAX/VISTARIL) tablet 25 mg  25 mg Oral Q8H PRN Karmen Bongo, MD      . Derrill Memo ON 03/14/2019] cinacalcet (SENSIPAR) tablet 30 mg  30 mg Oral Cathlean Sauer, MD      . collagenase (SANTYL) ointment 1 application  1 application Topical Daily Karmen Bongo, MD      . docusate sodium [facility]) enema 283 mg  1 enema Rectal PRN Karmen Bongo, MD      . Derrill Memo ON 03/13/2019] feeding supplement (NEPRO CARB STEADY) liquid 237 mL  237 mL Oral BID BM Karmen Bongo, MD      . feeding supplement (NEPRO CARB STEADY) liquid 237 mL  237 mL Oral TID PRN Karmen Bongo, MD      . feeding supplement (PRO-STAT SUGAR FREE 64) liquid 30 mL  30 mL Oral BID Karmen Bongo, MD      . Derrill Memo ON 03/13/2019] ferric citrate (AURYXIA) tablet 420 mg  420  mg Oral TID WC Karmen Bongo, MD      . ferric citrate (AURYXIA) tablet 420 mg  420 mg Oral With snacks Karmen Bongo, MD      . heparin injection 5,000 Units  5,000 Units Subcutaneous Camelia Phenes Karmen Bongo, MD      . Derrill Memo ON 03/14/2019] midodrine (PROAMATINE) tablet 10 mg  10 mg Oral Q M,W,F-HD Karmen Bongo, MD      . multivitamin (RENA-VIT) tablet 1 tablet  1 tablet Oral Ivery Quale, MD      . ondansetron [facility] tablet 4 mg  4 mg Oral Q6H PRN Karmen Bongo, MD       Or  . ondansetron [facility]) injection 4 mg  4 mg Intravenous Q6H PRN Karmen Bongo, MD      . oxyCODONE (Oxy IR/ROXICODONE) immediate release tablet 5 mg  5 mg Oral Q8H PRN Karmen Bongo, MD      . rosuvastatin (CRESTOR) tablet 10 mg  10 mg Oral q1800 Karmen Bongo, MD      . sorbitol 70 % solution 30 mL  30 mL Oral PRN Karmen Bongo, MD      . Derrill Memo ON 03/14/2019] vancomycin (VANCOCIN) IVPB 750 mg/150 ml premix  750 mg Intravenous Q M,W,F-HD Masters, Jake Church, RPH      . zolpidem (AMBIEN) tablet 5 mg  5 mg Oral QHS PRN Karmen Bongo, MD        REVIEW OF SYSTEMS:  [X]  denotes positive finding, [ ]  denotes negative finding Cardiac  Comments:  Chest pain or chest pressure:    Shortness of breath upon exertion:    Short of breath when lying flat:    Irregular heart rhythm:        Vascular    Pain in calf, thigh, or hip brought on by ambulation:    Pain in feet at night that wakes you up from your sleep:     Blood clot in your veins:    Leg swelling:           PHYSICAL EXAM: Vitals:   03/12/19 1239 03/12/19 1509 03/12/19 1600 03/12/19 1830  BP:  98/70 123/77 116/75  Pulse:  84 88 88  Resp:  16  16  Temp:      TempSrc:      SpO2: 100% 100% 98% 98%    GENERAL: The patient is a well-nourished male, in no acute distress. The vital signs are documented above. CARDIOVASCULAR: 2+ popliteal pulses bilaterally PULMONARY: There is good air exchange  MUSCULOSKELETAL: There are no major deformities  or cyanosis. NEUROLOGIC: No focal weakness or paresthesias are detected. SKIN: Poorly healing left third and fourth toe amputation with dusky second toe and dusky dorsum of his foot.  He has a skin graft on the lateral aspect of his right foot with apparent healing PSYCHIATRIC: The patient has a normal affect.  DATA:  Angios reviewed as above  MEDICAL ISSUES: Had a long discussion with the patient.  He has a very poor healing of his recent amputation.  Is admitted for local wound care and antibiotics.  Recommend wet-to-dry to the left toe amputation site.  Discussed with the patient very high risk for higher level amputation.  With the dusky area over the dorsum of his foot, I am not sure he would heal a transmetatarsal amputation.  I explained to him that he is at high risk for left below-knee amputation.  We will follow with you    [name], MD [facility] Tel [phone] Pager [phone]

## 2019-03-12 NOTE — ED Provider Notes (Signed)
[facility] EMERGENCY DEPARTMENT Provider Note   CSN: [id number] Arrival date & time: 03/12/19  1141    History   Chief Complaint Chief Complaint  Patient presents with  . Wound Infection    HPI Timothy Faulkner is a 73 y.o. male with history of ESRD on dialysis, osteomyelitis, gangrene, PAD, type 2 diabetes, ICD, CHF, presents today with concern of infection of the left foot.  Patient with multiple recent surgeries.  Revascularization of the left foot 02/25/2019.  He underwent peripheral vascular arthrectomy on 02/28/2019 with successful revascularization.  Patient then underwent amputation of the third and fourth toes of the left foot on 03/08/2019.  He reports that he had been feeling well and following up with podiatry as scheduled last visit 03/10/2019.  Patient reports that when his daughter was changing his dressings today she noted that his left second toe was darker compared to the others there was concern of infection and patient was sent to the ER.  On arrival patient reports that he is feeling well.  Reports mild dull pain to the left foot constant worse with palpation and improved by rest.  Denies fever/chills, nausea/vomiting, diarrhea, abdominal pain, chest pain/shortness of breath or any additional concerns.    HPI  Past Medical History:  Diagnosis Date  . Anemia   . Arthritis    Gout  . Automatic implantable cardioverter-defibrillator in situ    [employer]  . CHF   . ESRD on dialysis [facility])    [name] 03/11/2013 (03/11/2013) dialysis M/W/F  . Gangrene ([place])    left foot  . GERD (gastroesophageal reflux disease)   . Gout    "once a year"  . Heart murmur   . HYPERCHOLESTEROLEMIA, MIXED   . HYPERTENSION   . Macular degeneration   . Osteomyelitis ([place])    right foot  . 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 Pacemaker   . Peripheral arterial disease (HCC)    left fifth toe ulcer, healing  . Pneumonia   . Shortness of breath   . Type  2 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)    left fifth toe  . Wears dentures   . Wears glasses     Patient Active Problem List   Diagnosis Date Noted  . Critical lower limb ischemia 03/12/2019  . Hypotension 02/23/2019  . Cellulitis of left foot 02/23/2019  . Gangrene of toe of left foot ([place]) 02/22/2019  . Chronic osteomyelitis involving right ankle and foot ([place])   . Diabetic foot ulcer ([place]) 09/02/2018  . Encounter for planned post-operative wound closure   . Acute osteomyelitis of metatarsal bone of right foot ([place])   . Diabetic ulcer of midfoot associated with diabetes mellitus due to underlying condition, with necrosis of bone ([place])   . Osteomyelitis of right foot ([place])   . Vascular calcification   . Cellulitis and abscess of foot, except toes   . Anemia associated with chronic renal failure 07/13/2018  . Cardiomyopathy, dilated, nonischemic ([place]) 07/13/2018  . Diabetic foot infection ([place]) 07/13/2018  . PAD (peripheral artery disease) ([place])   . Macular degeneration   . Chronic systolic heart failure ([place]) 08/22/2014  . Diabetic infection of right foot ([place]) 07/13/2014  . ESRD (end stage renal disease) on dialysis ([place]) 11/11/2013  . Snoring 11/11/2013  . Unspecified sleep apnea 11/11/2013  . Other pancytopenia ([place]) 09/22/2013  . Hemoptysis 09/20/2013  . Pre-transplant evaluation for kidney transplant 01/11/2013  . Automatic implantable cardioverter-defibrillator in  situ 10/01/2010  . HYPERCHOLESTEROLEMIA, MIXED 05/03/2010  . Essential hypertension 05/03/2010  . CHF 05/03/2010    Past Surgical History:  Procedure Laterality Date  . A/V FISTULAGRAM Left 07/20/2018   Procedure: A/V FISTULAGRAM;  Surgeon: [name], MD;  Location: [facility];  Service: Cardiovascular;  Laterality: Left;  . ABDOMINAL AORTOGRAM N/A 02/25/2019   Procedure: ABDOMINAL AORTOGRAM;  Surgeon: Elam Dutch, MD;  Location: [facility];  Service:  Cardiovascular;  Laterality: N/A;  . ABDOMINAL AORTOGRAM W/LOWER EXTREMITY Bilateral 07/20/2018   Procedure: ABDOMINAL AORTOGRAM W/LOWER EXTREMITY;  Surgeon: [name], MD;  Location: [facility];  Service: Cardiovascular;  Laterality: Bilateral;  . AMPUTATION Right 09/07/2018   Procedure: Right fifth metatarsectomy;  Surgeon: [name], DPM;  Location: [name];  Service: Podiatry;  Laterality: Right;  . AMPUTATION Left 03/08/2019   Procedure: AMPUTATION  3RD AND 4TH TOES LEFT FOOT;  Surgeon: [name], DPM;  Location: [facility];  Service: Podiatry;  Laterality: Left;  . AV FISTULA PLACEMENT Right 12/13/2012   Procedure: ARTERIOVENOUS (AV) FISTULA CREATION;  Surgeon: [name], MD;  Location: [facility];  Service: Vascular;  Laterality: Right;  Ultrasound guided  . AV FISTULA PLACEMENT Left 05/07/2016   Procedure: LEFT RADIOCEPHALIC ARTERIOVENOUS (AV) FISTULA CREATION;  Surgeon: [name], MD;  Location: [facility];  Service: Vascular;  Laterality: Left;  . BASCILIC VEIN TRANSPOSITION Right 03/26/2016   Procedure: RIGHT BASILIC VEIN TRANSPOSITION;  Surgeon: [name], MD;  Location: [facility];  Service: Vascular;  Laterality: Right;  . CARDIAC CATHETERIZATION    . CARDIAC DEFIBRILLATOR PLACEMENT     Chemical engineer  . EYE SURGERY Bilateral    Cataract  . FISTULOGRAM Left 04/22/2018   Procedure: FISTULOGRAM UPPER EXTREMITY;  Surgeon: [name], MD;  Location: [facility];  Service: Vascular;  Laterality: Left;  . I&D EXTREMITY Right 07/15/2018   Procedure: IRRIGATION AND DEBRIDEMENT RIGHT FOOT;  Surgeon: [name], DPM;  Location: [facility];  Service: Podiatry;  Laterality: Right;  . INCISION AND DRAINAGE ABSCESS / HEMATOMA OF BURSA / KNEE / THIGH Left 2012   "knee" (03/11/2013)  . INSERT / REPLACE / REMOVE PACEMAKER    . INSERTION OF DIALYSIS CATHETER Left 04/22/2018   Procedure: INSERTION OF DIALYSIS CATHETER;  Surgeon: [name], MD;  Location: [facility];  Service:  Vascular;  Laterality: Left;  . IR FLUORO GUIDE CV LINE LEFT  07/15/2018  . IR PTA VENOUS EXCEPT DIALYSIS CIRCUIT  07/15/2018  . LOWER EXTREMITY ANGIOGRAPHY Right 07/21/2018   Procedure: LOWER EXTREMITY ANGIOGRAPHY;  Surgeon: [name], MD;  Location: [facility];  Service: Cardiovascular;  Laterality: Right;  . LOWER EXTREMITY ANGIOGRAPHY Bilateral 02/25/2019   Procedure: Lower Extremity Angiography;  Surgeon: Elam Dutch, MD;  Location: [facility];  Service: Cardiovascular;  Laterality: Bilateral;  . METATARSAL HEAD EXCISION Right 07/15/2018   Procedure: METATARSAL RESECTION;  Surgeon: [name], DPM;  Location: [facility];  Service: Podiatry;  Laterality: Right;  . MULTIPLE TOOTH EXTRACTIONS    . PERIPHERAL VASCULAR ATHERECTOMY Right 02/28/2019   Procedure: PERIPHERAL VASCULAR ATHERECTOMY;  Surgeon: Waynetta Sandy, MD;  Location: [facility];  Service: Cardiovascular;  Laterality: Right;  right tp trunk   . PERIPHERAL VASCULAR BALLOON ANGIOPLASTY Left 02/25/2019   Procedure: PERIPHERAL VASCULAR BALLOON ANGIOPLASTY;  Surgeon: Elam Dutch, MD;  Location: [facility];  Service: Cardiovascular;  Laterality: Left;  tibial peroneal trunk and PT  . PERIPHERAL VASCULAR INTERVENTION Right 07/21/2018   Procedure: PERIPHERAL VASCULAR INTERVENTION;  Surgeon: [name], MD;  Location: [facility];  Service: Cardiovascular;  Laterality: Right;  peroneal stents  . REVISON OF ARTERIOVENOUS FISTULA Right 2/87/8676   Procedure: Plication OF Right Arm ARTERIOVENOUS FISTULA;  Surgeon: Elam Dutch, MD;  Location: [facility] OR;  Service: Vascular;  Laterality: Right;  . REVISON OF ARTERIOVENOUS FISTULA Left 04/22/2018   Procedure: REVISION OF RADIOCEPHALIC ARTERIOVENOUS FISTULA;  Surgeon: [name], MD;  Location: [facility];  Service: Vascular;  Laterality: Left;  . SHUNTOGRAM N/A 05/31/2013   Procedure: Earney Mallet;  Surgeon: [name], MD;  Location: [facility] CATH LAB;  Service: Cardiovascular;  Laterality: N/A;  . UPPER EXTREMITY VENOGRAPHY Right 07/23/2018   Procedure: UPPER EXTREMITY VENOGRAPHY;  Surgeon: [name], MD;  Location: [facility];  Service: Cardiovascular;  Laterality: Right;  . WOUND DEBRIDEMENT Right 07/17/2018   Procedure: Wound Debridement; Closure Filleted toe flap Right Foot;  Surgeon: [name], DPM;  Location: [facility];  Service: Podiatry;  Laterality: Right;  . WOUND DEBRIDEMENT Right 09/07/2018   Procedure: Debridement Right Foot Wound, application of wound vac;  Surgeon: [name], DPM;  Location: [facility];  Service: Podiatry;  Laterality: Right;  . WOUND DEBRIDEMENT Right 03/08/2019   Procedure: DEBRIDEMENT WOUND RIGHT FOOT;  Surgeon: [name], DPM;  Location: [facility];  Service: Podiatry;  Laterality: Right;        Home Medications    Prior to Admission medications   Medication Sig Start Date End Date Taking? Authorizing Provider  acetaminophen (TYLENOL) 500 MG tablet Take 500 mg by mouth every 6 (six) hours as needed for mild pain or headache.   Yes [provider]  aspirin EC 81 MG tablet Take 81 mg by mouth daily.   Yes [provider]  AURYXIA 1 GM 210 MG(Fe) tablet Take 420 mg by mouth See admin instructions. Take 2 tablets (420 mg) by mouth with each meal and with each snack 11/12/16  Yes [provider]  calcitRIOL (ROCALTROL) 0.5 MCG capsule Take 1 capsule (0.5 mcg total) by mouth every Monday, Wednesday, and Friday with hemodialysis. 03/04/19  Yes Hongalgi, Lenis Dickinson, MD  cefTAZidime 1 g in dextrose 5 % 50 mL Inject 1 g into the vein every dialysis for up to 6 doses. Administer after dialysis or as per nephrologist. 03/10/19  Yes [name], DPM  clopidogrel (PLAVIX) 75 MG tablet Take 1 tablet (75 mg total) by mouth daily with breakfast. 07/23/18  Yes Irene Pap N, DO  colchicine 0.6 MG tablet Take 0.6 mg by mouth daily as  needed (as directed for gout flares).  02/26/18  Yes [provider]  collagenase (SANTYL) ointment Apply 1 application topically daily. Apply to right foot wound daily cover with dry dressing. 07/27/18  Yes [name], DPM  midodrine (PROAMATINE) 10 MG tablet Take 10 mg by mouth every Monday, Wednesday, and Friday with hemodialysis.  04/26/18  Yes [provider]  oxyCODONE (ROXICODONE) 5 MG immediate release tablet Take 1 tablet (5 mg total) by mouth every 8 (eight) hours as needed for severe pain. 03/08/19 03/07/20 Yes [name], DPM  SENSIPAR 30 MG tablet Take 30 mg by mouth See admin instructions. Take 30 mg by mouth every other morning   Yes [provider]  Amino Acids-Protein Hydrolys (FEEDING SUPPLEMENT, PRO-STAT  SUGAR FREE 64,) LIQD Take 30 mLs by mouth 2 (two) times daily. Patient not taking: Reported on 03/12/2019 03/03/19   Modena Jansky, MD  multivitamin (RENA-VIT) TABS tablet Take 1 tablet by mouth at bedtime. Patient not taking: Reported on 03/12/2019 07/23/18   Kayleen Memos, DO  Nutritional Supplements (FEEDING SUPPLEMENT, NEPRO CARB STEADY,) LIQD Take 237 mLs by mouth 2 (two) times daily between meals. 03/03/19   Hongalgi, Lenis Dickinson, MD  rosuvastatin (CRESTOR) 10 MG tablet Take 1 tablet (10 mg total) by mouth daily at 6 PM. Patient not taking: Reported on 03/12/2019 03/03/19   Modena Jansky, MD  Wound Dressings (MEDIHONEY WOUND/BURN DRESSING) GEL Apply to affected are 3 times a week, and cover with sterile dressing. 03/10/19   [name], DPM    Family History Family History  Problem Relation Age of Onset  . Heart disease Father   . CAD Father     Social History Social History   Tobacco Use  . Smoking status: Former Smoker    Packs/day: 0.75    Years: 30.00    Pack years: 22.50    Types: Cigarettes    Quit date: 11/25/1992    Years since quitting: 26.3  . Smokeless tobacco: Never Used  Substance Use Topics  . Alcohol use: Not  Currently  . Drug use: Not Currently    Types: Marijuana    Comment: last use 10 years ago     Allergies   Patient has no known allergies.   Review of Systems Review of Systems Ten systems are reviewed and are negative for acute change except as noted in the HPI  Physical Exam Updated Vital Signs BP 98/70 (BP Location: Right Arm)   Pulse 84   Temp 98.1 F (36.7 C) (Oral)   Resp 16   SpO2 100%   Physical Exam Constitutional:      General: He is not in acute distress.    Appearance: Normal appearance. He is well-developed. He is not ill-appearing or diaphoretic.  HENT:     Head: Normocephalic and atraumatic.     Right Ear: External ear normal.     Left Ear: External ear normal.     Nose: Nose normal.  Eyes:     General: Vision grossly intact. Gaze aligned appropriately.     Pupils: Pupils are equal, round, and reactive to light.  Neck:     Musculoskeletal: Normal range of motion.     Trachea: Trachea and phonation normal. No tracheal deviation.  Cardiovascular:     Rate and Rhythm: Normal rate and regular rhythm.     Pulses:          Dorsalis pedis pulses are detected w/ Doppler on the right side and detected w/ Doppler on the left side.       Posterior tibial pulses are detected w/ Doppler on the right side and detected w/ Doppler on the left side.  Pulmonary:     Effort: Pulmonary effort is normal. No respiratory distress.  Abdominal:     General: There is no distension.     Palpations: Abdomen is soft.     Tenderness: There is no abdominal tenderness. There is no guarding or rebound.  Musculoskeletal: Normal range of motion.  Feet:     Comments: Left foot status post amputation of toes 3/4.  Appropriate wound healing without drainage or surrounding erythema.  Left second toe darker in appearance, sensation intact however decreased capillary refill.  See attached picture.  Skin:    General: Skin is warm and dry.  Neurological:     Mental Status: He is alert.      GCS: GCS eye subscore is 4. GCS verbal subscore is 5. GCS motor subscore is 6.     Comments: Speech is clear and goal oriented, follows commands Major Cranial nerves without deficit, no facial droop Moves extremities without ataxia, coordination intact  Psychiatric:        Behavior: Behavior normal.       ED Treatments / Results  Labs (all labs ordered are listed, but only abnormal results are displayed) Labs Reviewed  COMPREHENSIVE METABOLIC PANEL - Abnormal; Notable for the following components:      Result Value   Sodium 133 (*)    Chloride 94 (*)    BUN 25 (*)    Creatinine, Ser 5.97 (*)    Albumin 3.0 (*)    GFR calc non Af Amer 9 (*)    GFR calc Af Amer 10 (*)    Anion gap 17 (*)    All other components within normal limits  CBC WITH DIFFERENTIAL/PLATELET - Abnormal; Notable for the following components:   RBC 3.84 (*)    Hemoglobin 11.7 (*)    MCV 102.1 (*)    MCHC 29.8 (*)    RDW 16.9 (*)    Neutro Abs 7.9 (*)    All other components within normal limits  PROTIME-INR - Abnormal; Notable for the following components:   Prothrombin Time 15.5 (*)    All other components within normal limits  CULTURE, BLOOD (ROUTINE X 2)  CULTURE, BLOOD (ROUTINE X 2)  LACTIC ACID, PLASMA  HEMOGLOBIN A1C  SEDIMENTATION RATE  C-REACTIVE PROTEIN  PREALBUMIN  BASIC METABOLIC PANEL  CBC    EKG None  Radiology Dg Foot Complete Left  Result Date: 03/12/2019 CLINICAL DATA:  Discoloration of the left second toe. History of prior third and fourth toe amputation. EXAM: LEFT FOOT - COMPLETE 3+ VIEW COMPARISON:  Plain films the left 02/22/2019. FINDINGS: The third and fourth toes have been amputated since the prior examination. Dressing is in place. No bony destructive change or periosteal reaction. No acute abnormality. First MTP osteoarthritis noted. No soft tissue gas or radiopaque foreign body. Atherosclerosis is seen. IMPRESSION: No acute abnormality Status post amputation of third  and fourth toes. First MTP osteoarthritis. Electronically Signed   By: Inge Rise M.D.   On: 03/12/2019 14:14    Procedures Procedures (including critical care time)  Medications Ordered in ED Medications  vancomycin (VANCOCIN) 1,500 mg in sodium chloride 0.9 % 500 mL IVPB (has no administration in time range)  aspirin EC tablet 81 mg (has no administration in time range)  oxyCODONE (Oxy IR/ROXICODONE) immediate release tablet 5 mg (has no administration in time range)  midodrine (PROAMATINE) tablet 10 mg (has no administration in time range)  rosuvastatin (CRESTOR) tablet 10 mg (has no administration in time range)  calcitRIOL (ROCALTROL) capsule 0.5 mcg (has no administration in time range)  cinacalcet (SENSIPAR) tablet 30 mg (has no administration in time range)  ferric citrate (AURYXIA) tablet 420 mg (has no administration in time range)  feeding supplement (PRO-STAT SUGAR FREE 64) liquid 30 mL (has no administration in time range)  feeding supplement (NEPRO CARB STEADY) liquid 237 mL (has no administration in time range)  multivitamin (RENA-VIT) tablet 1 tablet (has no administration in time range)  collagenase (SANTYL) ointment 1 application (has no administration in time range)  acetaminophen (TYLENOL)  tablet 650 mg (has no administration in time range)    Or  acetaminophen (TYLENOL) suppository 650 mg (has no administration in time range)  zolpidem (AMBIEN) tablet 5 mg (has no administration in time range)  sorbitol 70 % solution 30 mL (has no administration in time range)  docusate sodium (ENEMEEZ) enema 283 mg (has no administration in time range)  ondansetron (ZOFRAN) tablet 4 mg (has no administration in time range)    Or  ondansetron (ZOFRAN) injection 4 mg (has no administration in time range)  camphor-menthol (SARNA) lotion 1 application (has no administration in time range)    And  hydrOXYzine (ATARAX/VISTARIL) tablet 25 mg (has no administration in time range)   calcium carbonate (dosed in mg elemental calcium) suspension 500 mg of elemental calcium (has no administration in time range)  feeding supplement (NEPRO CARB STEADY) liquid 237 mL (has no administration in time range)  heparin injection 5,000 Units (has no administration in time range)     Initial Impression / Assessment and Plan / ED Course  I have reviewed the triage vital signs and the nursing notes.  Pertinent labs & imaging results that were available during my care of the patient were reviewed by me and considered in my medical decision making (see chart for details).  Clinical Course as of Mar 11 1713  Sat Mar 12, 2019  1628 Discussed case with vascular surgeon Dr. early who advises hospitalist admission for IV antibiotics and dialysis.  Vascular surgery to follow, doubt operable intervention available.   [BM]  1641 Dr. Lorin Mercy   [BM]    Clinical Course User Index [BM] Nuala Alpha A, PA-C   Lactic 1.6 PT/INR with PT of 15.5 CBC nonacute CMP with creatinine 5.97, dialysis patient last dialyzed yesterday, anion gap 19 Vital signs stable without fever. DP/PT pulses present by Doppler bilaterally. - Discussed case with vascular surgeon Dr. Donnetta Hutching who advises hospitalist admission for IV antibiotics and dialysis.  Vascular surgery to follow, doubt operable intervention available. - Patient reassessed resting comfortably and in no acute distress states understanding of care plan and is agreeable for admission. - Discussed case with hospitalist Dr. Lorin Mercy will be seeing patient for admission.  Patient has been admitted to hospitalist service for further evaluation and management.  Note: Portions of this report may have been transcribed using voice recognition software. Every effort was made to ensure accuracy; however, inadvertent computerized transcription errors may still be present. Final Clinical Impressions(s) / ED Diagnoses   Final diagnoses:  Ischemic toe    ED  Discharge Orders    None       Gari Crown 03/12/19 1718    Charlesetta Shanks, MD 03/13/19 1146

## 2019-03-13 DIAGNOSIS — L97512 Non-pressure chronic ulcer of other part of right foot with fat layer exposed: Secondary | ICD-10-CM

## 2019-03-13 DIAGNOSIS — I998 Other disorder of circulatory system: Secondary | ICD-10-CM

## 2019-03-13 DIAGNOSIS — I739 Peripheral vascular disease, unspecified: Secondary | ICD-10-CM

## 2019-03-13 LAB — BASIC METABOLIC PANEL
Anion gap: 13 (ref 5–15)
BUN: 34 mg/dL — ABNORMAL HIGH (ref 8–23)
CO2: 24 mmol/L (ref 22–32)
Calcium: 9.2 mg/dL (ref 8.9–10.3)
Chloride: 99 mmol/L (ref 98–111)
Creatinine, Ser: 6.93 mg/dL — ABNORMAL HIGH (ref 0.61–1.24)
GFR calc Af Amer: 8 mL/min — ABNORMAL LOW (ref >60)
GFR calc non Af Amer: 7 mL/min — ABNORMAL LOW (ref >60)
Glucose, Bld: 78 mg/dL (ref 70–99)
Potassium: 3.5 mmol/L (ref 3.5–5.1)
Sodium: 136 mmol/L (ref 135–145)

## 2019-03-13 LAB — CBC
HCT: 37.7 % — ABNORMAL LOW (ref 39.0–52.0)
Hemoglobin: 11.7 g/dL — ABNORMAL LOW (ref 13.0–17.0)
MCH: 30.5 pg (ref 26.0–34.0)
MCHC: 31 g/dL (ref 30.0–36.0)
MCV: 98.2 fL (ref 80.0–100.0)
Platelets: 340 10*3/uL (ref 150–400)
RBC: 3.84 MIL/uL — ABNORMAL LOW (ref 4.22–5.81)
RDW: 16.8 % — ABNORMAL HIGH (ref 11.5–15.5)
WBC: 8.9 10*3/uL (ref 4.0–10.5)
nRBC: 0 % (ref 0.0–0.2)

## 2019-03-13 LAB — AEROBIC/ANAEROBIC CULTURE W GRAM STAIN (SURGICAL/DEEP WOUND)

## 2019-03-13 LAB — GLUCOSE, CAPILLARY
Glucose-Capillary: 66 mg/dL — ABNORMAL LOW (ref 70–99)
Glucose-Capillary: 92 mg/dL (ref 70–99)
Glucose-Capillary: 109 mg/dL — ABNORMAL HIGH (ref 70–99)
Glucose-Capillary: 137 mg/dL — ABNORMAL HIGH (ref 70–99)
Glucose-Capillary: 111 mg/dL — ABNORMAL HIGH (ref 70–99)

## 2019-03-13 LAB — SARS CORONAVIRUS 2 (TAT 6-24 HRS): SARS Coronavirus 2: NEGATIVE

## 2019-03-13 MED ORDER — PIPERACILLIN-TAZOBACTAM 3.375 G IVPB
3.3750 g | Freq: Two times a day (BID) | INTRAVENOUS | Status: DC
  Administered 2019-03-13 – 2019-03-15 (×4): 3.375 g via INTRAVENOUS
  Filled 2019-03-13 (×5): qty 50

## 2019-03-13 MED ORDER — CHLORHEXIDINE GLUCONATE CLOTH 2 % EX PADS
6.0000 | MEDICATED_PAD | Freq: Every day | CUTANEOUS | Status: DC
  Administered 2019-03-13 – 2019-03-17 (×4): 6 via TOPICAL

## 2019-03-13 NOTE — Progress Notes (Signed)
Pharmacy Antibiotic Note  Timothy Faulkner is a 73 y.o. male admitted on 03/12/2019 with poorly healing wounds s/p toe amputations.  Pharmacy has been consulted for Zosyn dosing.  Pt is on chronic HD (MWF).    Plan: Zosyn 3.375g IV q12h (4 hour infusion). Continue vancomycin 750 mg on MWF post HD Monitor levels at steady state  Monitor for clinical improvement   Temp (24hrs), Avg:98.1 F (36.7 C), Min:98.1 F (36.7 C), Max:98.1 F (36.7 C)  Recent Labs  Lab 03/08/19 0546 03/12/19 1216 03/12/19 1500 03/13/19 0543  WBC 8.1 9.9  --  8.9  CREATININE 7.00* 5.97*  --  6.93*  LATICACIDVEN  --   --  1.6  --     Estimated Creatinine Clearance: 9.3 mL/min (A) (by C-G formula based on SCr of 6.93 mg/dL (H)).    No Known Allergies  Antimicrobials this admission: vancomycin 8/1 >>  zosyn 8/2 >>   Dose adjustments this admission: N/A  Microbiology results: Covid negative 8/2 BCx: pending 7/28 wound cx: enterobacter cloacae (resistant to cefazolin)     Thank you for allowing pharmacy to be a part of this patient's care.  Eddie Candle, PharmD PGY-1 Pharmacy Resident  Phone: [phone]

## 2019-03-13 NOTE — Progress Notes (Signed)
Patient ID: Timothy Faulkner, male   DOB: 1946/04/15, 73 y.o.   MRN: [id number]  Progress Note    03/13/2019 1:45 PM * No surgery found *  Subjective: Discussed with [name] who is also seen the patient in consultation.  With the poor healing of his third and fourth toe amputation and the very dusky appearance of the dorsum of his foot, he has very little chance for healing a transmetatarsal amputation   Vitals:   03/12/19 1830 03/13/19 0904  BP: 116/75 122/80  Pulse: 88 90  Resp: 16 18  Temp:  98.2 F (36.8 C)  SpO2: 98% 98%   Physical Exam: No change  CBC    Component Value Date/Time   WBC 8.9 03/13/2019 0543   RBC 3.84 (L) 03/13/2019 0543   HGB 11.7 (L) 03/13/2019 0543   HGB 15.2 05/11/2018 1219   HCT 37.7 (L) 03/13/2019 0543   PLT 340 03/13/2019 0543   PLT 245 05/11/2018 1219   MCV 98.2 03/13/2019 0543   MCH 30.5 03/13/2019 0543   MCHC 31.0 03/13/2019 0543   RDW 16.8 (H) 03/13/2019 0543   LYMPHSABS 0.8 03/12/2019 1216   MONOABS 1.0 03/12/2019 1216   EOSABS 0.1 03/12/2019 1216   BASOSABS 0.0 03/12/2019 1216    BMET    Component Value Date/Time   NA 136 03/13/2019 0543   K 3.5 03/13/2019 0543   CL 99 03/13/2019 0543   CO2 24 03/13/2019 0543   GLUCOSE 78 03/13/2019 0543   BUN 34 (H) 03/13/2019 0543   CREATININE 6.93 (H) 03/13/2019 0543   CREATININE 9.49 (HH) 05/11/2018 1219   CALCIUM 9.2 03/13/2019 0543   GFRNONAA 7 (L) 03/13/2019 0543   GFRNONAA 5 (L) 05/11/2018 1219   GFRAA 8 (L) 03/13/2019 0543   GFRAA 6 (L) 05/11/2018 1219    INR    Component Value Date/Time   INR 1.2 03/12/2019 1216     Intake/Output Summary (Last 24 hours) at 03/13/2019 1345 Last data filed at 03/13/2019 0900 Gross per 24 hour  Intake 750 ml  Output 0 ml  Net 750 ml     Assessment/Plan:  73 y.o. male with nonhealing third and fourth toe amputation on the left with known severe tibial disease.  I have recommended left below-knee amputation.  Patient feels comfortable with  this.  I have offered this for tomorrow.  He is requesting to have his usual dialysis tomorrow and is requesting to proceed with below-knee amputation on Tuesday.  I feel that this is safe and appropriate.  He understands will be 1 of my partners doing the procedure     [name], MD FACS [facility] [phone] 03/13/2019 1:45 PM

## 2019-03-13 NOTE — Progress Notes (Signed)
Subjective:  Patient ID: Timothy Faulkner, male    DOB: 1946/08/03,  MRN: [id number]  Chief Complaint  Patient presents with  . Foot Problem    wound care on right foot, pt states that he is doing a lot better since the last time he saw [name], pt also wants [name] to look at the left foot as well, pt is also looking to get more medical supplies   DOS: 07/15/18 Procedure:  1) Incision Bone Cortex for Osteomyelitis             2) Bone Biopsy Distal Right 5th Metatarsal             3) Metatarsectomy              4) Bone Biopsy Proximal Right 5th Metatarsal             5) Application of Wound VAC  DOS: 07/17/18 Procedure:  1) Bone Biopsy Left 5th Phalanx             2) Delayed Wound Closure - Complex             3) Filleted Toe Flap  DOS: 09/07/18 Procedure:  1) Debridement of Skin, Subcutaneous Tissue, and Bone             2) Partial 5th Metatarsectomy             3) Application of Wound VAC  73 y.o. male returns for wound care. Hx above confirmed with patient.  Review of Systems: Negative except as noted in the HPI. Denies N/V/F/Ch.  Past Medical History:  Diagnosis Date  . Anemia   . Arthritis    Gout  . Automatic implantable cardioverter-defibrillator in situ    [employer]  . CHF   . ESRD on dialysis [facility])    [name] 03/11/2013 (03/11/2013) dialysis M/W/F  . Gangrene ([place])    left foot  . GERD (gastroesophageal reflux disease)   . Gout    "once a year"  . Heart murmur   . HYPERCHOLESTEROLEMIA, MIXED   . HYPERTENSION   . Macular degeneration   . Osteomyelitis ([place])    right foot  . 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 Pacemaker   . Peripheral arterial disease (HCC)    left fifth toe ulcer, healing  . Pneumonia   . Shortness of breath   . 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 Wears dentures   . Wears glasses    No current facility-administered medications for this visit.   No current outpatient medications on file.  Facility-Administered Medications Ordered in Other Visits:  .  acetaminophen (TYLENOL) tablet 650 mg, 650 mg, Oral, Q6H PRN, 650 mg at 03/12/19 2225 **OR** acetaminophen (TYLENOL) suppository 650 mg, 650 mg, Rectal, Q6H PRN, Karmen Bongo, MD .  aspirin EC tablet 81 mg, 81 mg, Oral, Daily, Karmen Bongo, MD, 81 mg at 03/13/19 1041 .  [START ON 03/14/2019] calcitRIOL (ROCALTROL) capsule 0.5 mcg, 0.5 mcg, Oral, Q M,W,F-HD, Karmen Bongo, MD .  calcium carbonate (dosed in mg elemental calcium) suspension 500 mg of elemental calcium, 500 mg of elemental calcium, Oral, Q6H PRN, Karmen Bongo, MD .  camphor-menthol [facility]) lotion 1 application, 1 application, Topical, S8O PRN **AND** hydrOXYzine (ATARAX/VISTARIL) tablet 25 mg, 25 mg, Oral, Q8H PRN, Karmen Bongo, MD .  Chlorhexidine Gluconate Cloth 2 % PADS 6 each, 6 each, Topical, Q0600, Reinaldo Meeker,  Jana Half, PA-C, 6 each at 03/13/19 1650 .  [START ON 03/14/2019] cinacalcet (SENSIPAR) tablet 30 mg, 30 mg, Oral, Cathlean Sauer, MD .  collagenase (SANTYL) ointment 1 application, 1 application, Topical, Daily, Karmen Bongo, MD .  docusate sodium [facility]) enema 283 mg, 1 enema, Rectal, PRN, Karmen Bongo, MD .  feeding supplement (NEPRO CARB STEADY) liquid 237 mL, 237 mL, Oral, BID BM, Karmen Bongo, MD .  feeding supplement (NEPRO CARB STEADY) liquid 237 mL, 237 mL, Oral, TID PRN, Karmen Bongo, MD .  feeding supplement (PRO-STAT SUGAR FREE 64) liquid 30 mL, 30 mL, Oral, BID, Karmen Bongo, MD, 30 mL at 03/13/19 2131 .  ferric citrate (AURYXIA) tablet 420 mg, 420 mg, Oral, TID WC, Karmen Bongo, MD, 420 mg at 03/13/19 1655 .  ferric citrate (AURYXIA) tablet 420 mg, 420 mg, Oral, With snacks, Karmen Bongo, MD, 420 mg at 03/13/19 2131 .  heparin injection 5,000 Units, 5,000 Units, Subcutaneous, Q8H, Karmen Bongo, MD, 5,000 Units at 03/13/19 2130 .  [START ON 03/14/2019] midodrine  (PROAMATINE) tablet 10 mg, 10 mg, Oral, Q M,W,F-HD, Karmen Bongo, MD .  multivitamin (RENA-VIT) tablet 1 tablet, 1 tablet, Oral, Ivery Quale, MD, 1 tablet at 03/13/19 2132 .  ondansetron (ZOFRAN) tablet 4 mg, 4 mg, Oral, Q6H PRN **OR** ondansetron (ZOFRAN) injection 4 mg, 4 mg, Intravenous, Q6H PRN, Karmen Bongo, MD .  oxyCODONE (Oxy IR/ROXICODONE) immediate release tablet 5 mg, 5 mg, Oral, Q8H PRN, Karmen Bongo, MD, 5 mg at 03/13/19 1910 .  piperacillin-tazobactam (ZOSYN) IVPB 3.375 g, 3.375 g, Intravenous, Q12H, Adhikari, Amrit, MD, Last Rate: 12.5 mL/hr at 03/13/19 2136, 3.375 g at 03/13/19 2136 .  rosuvastatin (CRESTOR) tablet 10 mg, 10 mg, Oral, q1800, Karmen Bongo, MD, 10 mg at 03/13/19 1655 .  sorbitol 70 % solution 30 mL, 30 mL, Oral, PRN, Karmen Bongo, MD .  Derrill Memo ON 03/14/2019] vancomycin (VANCOCIN) IVPB 750 mg/150 ml premix, 750 mg, Intravenous, Q M,W,F-HD, Masters, Jake Church, RPH .  zolpidem [facility]) tablet 5 mg, 5 mg, Oral, QHS PRN, Karmen Bongo, MD, 5 mg at 03/13/19 2132  Social History   Tobacco Use  Smoking Status Former Smoker  . Packs/day: 0.75  . Years: 30.00  . Pack years: 22.50  . Types: Cigarettes  . Quit date: 11/25/1992  . Years since quitting: 26.3  Smokeless Tobacco Never Used    No Known Allergies Objective:   There were no vitals filed for this visit. There is no height or weight on file to calculate BMI. Constitutional Well developed. Well nourished.  Vascular Foot warm and well perfused. Capillary refill normal to all digits.   Neurologic Normal speech. Oriented to person, place, and time. Epicritic sensation to light touch grossly present bilaterally.  Dermatologic Wound 2 x4 right foot with granular base. 90% granular. No warmth, erythema, signs of acute infeciton.  Left 3rd toe with dry eschar and abrasion. Maceration between 3rd/4th toes.  Orthopedic: Tenderness to palpation noted about the surgical site. Hx of R 5th toe  amputation, metatarsal resection.   Radiographs: None.  Assessment:   1. Diabetic ulcer of toe of left foot associated with type 2 diabetes mellitus, with fat layer exposed ([place])   2. Diabetic ulcer of other part of right foot associated with diabetes mellitus due to underlying condition, limited to breakdown of skin [facility])    Plan:  Patient was evaluated and treated and all questions answered.  S/p Right foot debridement -Wound continues to improve, albeit slowly. -Gentle debridement  of fibrosis. Dressed with collagen gel -Continue santyl with [facility] thrice weekly.  Ulcer left 3rd toe, maceration 4th toe -Dressed with betadine. -Discussed importance of padding in between the 3rd and 4th toes. -Discussed continued betadine application until next visit.  No follow-ups on file.

## 2019-03-13 NOTE — Progress Notes (Signed)
Received from ED via stretcher. Clothes,cell phone,charger brought with pt. No c/o pain at this time. Assessment as charted

## 2019-03-13 NOTE — Consult Note (Signed)
Reason for Consult: Left second toe gangrene, PAD Referring Physician: Dr. Norville Haggard is an 73 y.o. male.  HPI: 73 year old male with end-stage renal disease on dialysis, PAD well-known to [facility] recently underwent left third and fourth toe amputations on 03/08/2019.  At his first postop visit the postop sites did appear to be healing okay but did appear slightly desiccated.  This does appear to have progressed.  The second toe appears to have had progressing ischemic changes.  Past Medical History:  Diagnosis Date  . Anemia   . Arthritis    Gout  . Automatic implantable cardioverter-defibrillator in situ    [employer]  . CHF   . ESRD on dialysis [facility])    [name] 03/11/2013 (03/11/2013) dialysis M/W/F  . Gangrene ([place])    left foot  . GERD (gastroesophageal reflux disease)   . Gout    "once a year"  . Heart murmur   . HYPERCHOLESTEROLEMIA, MIXED   . HYPERTENSION   . Macular degeneration   . Osteomyelitis ([place])    right foot  . 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 Pacemaker   . Peripheral arterial disease (HCC)    left fifth toe ulcer, healing  . Pneumonia   . Shortness of breath   . 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 Wears dentures   . Wears glasses     Past Surgical History:  Procedure Laterality Date  . A/V FISTULAGRAM Left 07/20/2018   Procedure: A/V FISTULAGRAM;  Surgeon: [name], MD;  Location: [place] [facility];  Service: Cardiovascular;  Laterality: Left;  . ABDOMINAL AORTOGRAM N/A 02/25/2019   Procedure: ABDOMINAL AORTOGRAM;  Surgeon: Elam Dutch, MD;  Location: [place] [facility];  Service: Cardiovascular;  Laterality: N/A;  . ABDOMINAL AORTOGRAM W/LOWER EXTREMITY Bilateral 07/20/2018   Procedure: ABDOMINAL AORTOGRAM W/LOWER EXTREMITY;  Surgeon: [name], MD;  Location: [place] [facility];  Service: Cardiovascular;  Laterality: Bilateral;  . AMPUTATION Right 09/07/2018    Procedure: Right fifth metatarsectomy;  Surgeon: [name], DPM;  Location: [place];  Service: Podiatry;  Laterality: Right;  . AMPUTATION Left 03/08/2019   Procedure: AMPUTATION  3RD AND 4TH TOES LEFT FOOT;  Surgeon: [name], DPM;  Location: [place];  Service: Podiatry;  Laterality: Left;  . AV FISTULA PLACEMENT Right 12/13/2012   Procedure: ARTERIOVENOUS (AV) FISTULA CREATION;  Surgeon: [name], MD;  Location: [place];  Service: Vascular;  Laterality: Right;  Ultrasound guided  . AV FISTULA PLACEMENT Left 05/07/2016   Procedure: LEFT RADIOCEPHALIC ARTERIOVENOUS (AV) FISTULA CREATION;  Surgeon: [name], MD;  Location: [place];  Service: Vascular;  Laterality: Left;  . BASCILIC VEIN TRANSPOSITION Right 03/26/2016   Procedure: RIGHT BASILIC VEIN TRANSPOSITION;  Surgeon: [name], MD;  Location: [place];  Service: Vascular;  Laterality: Right;  . CARDIAC CATHETERIZATION    . CARDIAC DEFIBRILLATOR PLACEMENT     Chemical engineer  . EYE SURGERY Bilateral    Cataract  . FISTULOGRAM Left 04/22/2018   Procedure: FISTULOGRAM UPPER EXTREMITY;  Surgeon: [name], MD;  Location: [place];  Service: Vascular;  Laterality: Left;  . I&D EXTREMITY Right 07/15/2018   Procedure: IRRIGATION AND DEBRIDEMENT RIGHT FOOT;  Surgeon: [name], DPM;  Location: [place];  Service: Podiatry;  Laterality: Right;  . INCISION AND DRAINAGE ABSCESS / HEMATOMA OF BURSA / KNEE / THIGH Left  2012   "knee" (03/11/2013)  . INSERT / REPLACE / REMOVE PACEMAKER    . INSERTION OF DIALYSIS CATHETER Left 04/22/2018   Procedure: INSERTION OF DIALYSIS CATHETER;  Surgeon: [name], MD;  Location: [place];  Service: Vascular;  Laterality: Left;  . IR FLUORO GUIDE CV LINE LEFT  07/15/2018  . IR PTA VENOUS EXCEPT DIALYSIS CIRCUIT  07/15/2018  . LOWER EXTREMITY ANGIOGRAPHY Right 07/21/2018   Procedure: LOWER EXTREMITY ANGIOGRAPHY;  Surgeon: [name], MD;  Location: [place] [facility];  Service:  Cardiovascular;  Laterality: Right;  . LOWER EXTREMITY ANGIOGRAPHY Bilateral 02/25/2019   Procedure: Lower Extremity Angiography;  Surgeon: Elam Dutch, MD;  Location: [place] [facility];  Service: Cardiovascular;  Laterality: Bilateral;  . METATARSAL HEAD EXCISION Right 07/15/2018   Procedure: METATARSAL RESECTION;  Surgeon: [name], DPM;  Location: [place];  Service: Podiatry;  Laterality: Right;  . MULTIPLE TOOTH EXTRACTIONS    . PERIPHERAL VASCULAR ATHERECTOMY Right 02/28/2019   Procedure: PERIPHERAL VASCULAR ATHERECTOMY;  Surgeon: Waynetta Sandy, MD;  Location: [place] [facility];  Service: Cardiovascular;  Laterality: Right;  right tp trunk   . PERIPHERAL VASCULAR BALLOON ANGIOPLASTY Left 02/25/2019   Procedure: PERIPHERAL VASCULAR BALLOON ANGIOPLASTY;  Surgeon: Elam Dutch, MD;  Location: [place] [facility];  Service: Cardiovascular;  Laterality: Left;  tibial peroneal trunk and PT  . PERIPHERAL VASCULAR INTERVENTION Right 07/21/2018   Procedure: PERIPHERAL VASCULAR INTERVENTION;  Surgeon: [name], MD;  Location: [place] [facility];  Service: Cardiovascular;  Laterality: Right;  peroneal stents  . REVISON OF ARTERIOVENOUS FISTULA Right 9/92/4268   Procedure: Plication OF Right Arm ARTERIOVENOUS FISTULA;  Surgeon: Elam Dutch, MD;  Location: [facility];  Service: Vascular;  Laterality: Right;  . REVISON OF ARTERIOVENOUS FISTULA Left 04/22/2018   Procedure: REVISION OF RADIOCEPHALIC ARTERIOVENOUS FISTULA;  Surgeon: [name], MD;  Location: [place];  Service: Vascular;  Laterality: Left;  . SHUNTOGRAM N/A 05/31/2013   Procedure: Earney Mallet;  Surgeon: [name], MD;  Location: [facility] CATH LAB;  Service: Cardiovascular;  Laterality: N/A;  . UPPER EXTREMITY VENOGRAPHY Right 07/23/2018   Procedure: UPPER EXTREMITY VENOGRAPHY;  Surgeon: [name], MD;  Location: [place] [facility];  Service: Cardiovascular;  Laterality: Right;  . WOUND  DEBRIDEMENT Right 07/17/2018   Procedure: Wound Debridement; Closure Filleted toe flap Right Foot;  Surgeon: [name], DPM;  Location: [place];  Service: Podiatry;  Laterality: Right;  . WOUND DEBRIDEMENT Right 09/07/2018   Procedure: Debridement Right Foot Wound, application of wound vac;  Surgeon: [name], DPM;  Location: [place];  Service: Podiatry;  Laterality: Right;  . WOUND DEBRIDEMENT Right 03/08/2019   Procedure: DEBRIDEMENT WOUND RIGHT FOOT;  Surgeon: [name], DPM;  Location: [place];  Service: Podiatry;  Laterality: Right;    Family History  Problem Relation Age of Onset  . Heart disease Father   . CAD Father     Social History:  reports that he quit smoking about 26 years ago. His smoking use included cigarettes. He has a 22.50 pack-year smoking history. He has never used smokeless tobacco. He reports previous alcohol use. He reports previous drug use. Drug: Marijuana.  Allergies: No Known Allergies  Medications: I have reviewed the patient's current medications.  Results for orders placed or performed during the hospital encounter of 03/12/19 (from the past 48 hour(s))  Comprehensive metabolic panel     Status: Abnormal  Collection Time: 03/12/19 12:16 PM  Result Value Ref Range   Sodium 133 (L) 135 - 145 mmol/L   Potassium 3.9 3.5 - 5.1 mmol/L   Chloride 94 (L) 98 - 111 mmol/L   CO2 22 22 - 32 mmol/L   Glucose, Bld 87 70 - 99 mg/dL   BUN 25 (H) 8 - 23 mg/dL   Creatinine, Ser 5.97 (H) 0.61 - 1.24 mg/dL   Calcium 9.4 8.9 - 10.3 mg/dL   Total Protein 8.1 6.5 - 8.1 g/dL   Albumin 3.0 (L) 3.5 - 5.0 g/dL   AST 17 15 - 41 U/L   ALT 6 0 - 44 U/L   Alkaline Phosphatase 66 38 - 126 U/L   Total Bilirubin 0.9 0.3 - 1.2 mg/dL   GFR calc non Af Amer 9 (L) >60 mL/min   GFR calc Af Amer 10 (L) >60 mL/min   Anion gap 17 (H) 5 - 15    Comment: Performed at [facility], [place] [address] [place], [place] [address]  CBC with Differential     Status: Abnormal    Collection Time: 03/12/19 12:16 PM  Result Value Ref Range   WBC 9.9 4.0 - 10.5 K/uL   RBC 3.84 (L) 4.22 - 5.81 MIL/uL   Hemoglobin 11.7 (L) 13.0 - 17.0 g/dL   HCT 39.2 39.0 - 52.0 %   MCV 102.1 (H) 80.0 - 100.0 fL   MCH 30.5 26.0 - 34.0 pg   MCHC 29.8 (L) 30.0 - 36.0 g/dL   RDW 16.9 (H) 11.5 - 15.5 %   Platelets 356 150 - 400 K/uL   nRBC 0.0 0.0 - 0.2 %   Neutrophils Relative % 80 %   Neutro Abs 7.9 (H) 1.7 - 7.7 K/uL   Lymphocytes Relative 8 %   Lymphs Abs 0.8 0.7 - 4.0 K/uL   Monocytes Relative 10 %   Monocytes Absolute 1.0 0.1 - 1.0 K/uL   Eosinophils Relative 1 %   Eosinophils Absolute 0.1 0.0 - 0.5 K/uL   Basophils Relative 0 %   Basophils Absolute 0.0 0.0 - 0.1 K/uL   Immature Granulocytes 1 %   Abs Immature Granulocytes 0.05 0.00 - 0.07 K/uL    Comment: Performed at [facility], [place] [address], [place], [place] [address]  Protime-INR     Status: Abnormal   Collection Time: 03/12/19 12:16 PM  Result Value Ref Range   Prothrombin Time 15.5 (H) 11.4 - 15.2 seconds   INR 1.2 0.8 - 1.2    Comment: (NOTE) INR goal varies based on device and disease states. Performed at [facility], [place] [address], [place], [place] [address]   Blood culture (routine x 2)     Status: None (Preliminary result)   Collection Time: 03/12/19  1:00 PM   Specimen: BLOOD RIGHT WRIST  Result Value Ref Range   Specimen Description BLOOD RIGHT WRIST    Special Requests      BOTTLES DRAWN AEROBIC AND ANAEROBIC Blood Culture results may not be optimal due to an inadequate volume of blood received in culture bottles   Culture      NO GROWTH < 24 HOURS Performed at [facility] [address], [place], [place] [address]    Report Status PENDING   Blood culture (routine x 2)     Status: None (Preliminary result)   Collection Time: 03/12/19  2:50 PM   Specimen: BLOOD RIGHT HAND  Result Value Ref  Range   Specimen Description BLOOD RIGHT HAND    Special Requests      BOTTLES  DRAWN AEROBIC AND ANAEROBIC Blood Culture results may not be optimal due to an inadequate volume of blood received in culture bottles   Culture      NO GROWTH < 24 HOURS Performed at [facility] [address], [place], [place] [address]    Report Status PENDING   Lactic acid, plasma     Status: None   Collection Time: 03/12/19  3:00 PM  Result Value Ref Range   Lactic Acid, Venous 1.6 0.5 - 1.9 mmol/L    Comment: Performed at [facility] [address], [place], [place] [address]  SARS CORONAVIRUS 2 Nasal Swab Aptima Multi Swab     Status: None   Collection Time: 03/12/19  5:18 PM   Specimen: Aptima Multi Swab; Nasal Swab  Result Value Ref Range   SARS Coronavirus 2 NEGATIVE NEGATIVE    Comment: (NOTE) SARS-CoV-2 target nucleic acids are NOT DETECTED. The SARS-CoV-2 RNA is generally detectable in upper and lower respiratory specimens during the acute phase of infection. Negative results do not preclude SARS-CoV-2 infection, do not rule out co-infections with other pathogens, and should not be used as the sole basis for treatment or other patient management decisions. Negative results must be combined with clinical observations, patient history, and epidemiological information. The expected result is Negative. Fact Sheet for Patients: SugarRoll.be Fact Sheet for Healthcare Providers: https://www.woods-mathews.com/ This test is not yet approved or cleared by the [place] FDA and  has been authorized for detection and/or diagnosis of SARS-CoV-2 by FDA under an Emergency Use Authorization (EUA). This EUA will remain  in effect (meaning this test can be used) for the duration of the COVID-19 declaration under Section 56 4(b)(1) of the Act, 21 U.S.C. section 360bbb-3(b)(1), unless the authorization is terminated or revoked sooner. Performed at [facility], [place] [address], [place], [place] [address]   Sedimentation  rate     Status: Abnormal   Collection Time: 03/12/19  5:45 PM  Result Value Ref Range   Sed Rate 108 (H) 0 - 16 mm/hr    Comment: Performed at [facility] [address] [place], [place] [address]  C-reactive protein     Status: Abnormal   Collection Time: 03/12/19  5:45 PM  Result Value Ref Range   CRP 27.1 (H) <1.0 mg/dL    Comment: Performed at [facility] [address] [place], [place] [address]  Prealbumin     Status: Abnormal   Collection Time: 03/12/19  5:45 PM  Result Value Ref Range   Prealbumin 12.1 (L) 18 - 38 mg/dL    Comment: Performed at [facility] [address] [place], [place] [address]  Hemoglobin A1c     Status: None   Collection Time: 03/12/19  5:45 PM  Result Value Ref Range   Hgb A1c MFr Bld 5.2 4.8 - 5.6 %    Comment: (NOTE) Pre diabetes:          5.7%-6.4% Diabetes:              >6.4% Glycemic control for   <7.0% adults with diabetes    Mean Plasma Glucose 102.54 mg/dL    Comment: Performed at [facility] [address] [place], [place] [address]  Glucose, capillary     Status: None   Collection Time: 03/12/19  8:18 PM  Result Value Ref Range   Glucose-Capillary 80 70 - 99 mg/dL  Basic metabolic panel     Status: Abnormal   Collection Time: 03/13/19  5:43 AM  Result Value Ref Range   Sodium 136 135 - 145 mmol/L   Potassium 3.5 3.5 - 5.1 mmol/L   Chloride 99 98 - 111 mmol/L   CO2 24 22 - 32 mmol/L   Glucose, Bld 78 70 - 99 mg/dL   BUN 34 (H) 8 - 23 mg/dL   Creatinine, Ser 6.93 (H) 0.61 - 1.24 mg/dL   Calcium 9.2 8.9 - 10.3 mg/dL   GFR calc non Af Amer 7 (L) >60 mL/min   GFR calc Af Amer 8 (L) >60 mL/min   Anion gap 13 5 - 15    Comment: Performed at [facility], [place] [address] [place], [place] [address]  CBC     Status: Abnormal   Collection Time: 03/13/19  5:43 AM  Result Value Ref Range   WBC 8.9 4.0 - 10.5 K/uL   RBC 3.84 (L) 4.22 - 5.81 MIL/uL   Hemoglobin 11.7 (L) 13.0 - 17.0 g/dL   HCT 37.7  (L) 39.0 - 52.0 %   MCV 98.2 80.0 - 100.0 fL   MCH 30.5 26.0 - 34.0 pg   MCHC 31.0 30.0 - 36.0 g/dL   RDW 16.8 (H) 11.5 - 15.5 %   Platelets 340 150 - 400 K/uL   nRBC 0.0 0.0 - 0.2 %    Comment: Performed at [facility], [place] [address] [place], [place] [address]  Glucose, capillary     Status: Abnormal   Collection Time: 03/13/19  6:50 AM  Result Value Ref Range   Glucose-Capillary 66 (L) 70 - 99 mg/dL  Glucose, capillary     Status: None   Collection Time: 03/13/19  7:51 AM  Result Value Ref Range   Glucose-Capillary 92 70 - 99 mg/dL    Dg Foot Complete Left  Result Date: 03/12/2019 CLINICAL DATA:  Discoloration of the left second toe. History of prior third and fourth toe amputation. EXAM: LEFT FOOT - COMPLETE 3+ VIEW COMPARISON:  Plain films the left 02/22/2019. FINDINGS: The third and fourth toes have been amputated since the prior examination. Dressing is in place. No bony destructive change or periosteal reaction. No acute abnormality. First MTP osteoarthritis noted. No soft tissue gas or radiopaque foreign body. Atherosclerosis is seen. 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 Electronically Signed   By: Inge Rise M.D.   On: 03/12/2019 14:14    ROS Blood pressure 122/80, pulse 90, temperature 98.2 F (36.8 C), temperature source Oral, resp. rate 18, SpO2 98 %. Physical Exam  Right foot wound healing well with intact graft.  Wound base almost fully granular.  No drainage.  Right foot warm to touch Left foot third fourth wound area with ischemic changes.  Wound desiccated with black demarcation. Foot is warm to touch however excessive cooling noted distally towards the MPJ sulcus. Left second toe pallor with absent capillary refill.  Assessment/Plan: Left second toe gangrene, PAD; poor healing of left third and fourth amputation sites; well-healing right foot wound -Per Dr. Donnetta Hutching no further vascular  intervention available to meaningfully improve circulation. -Discussed with patient further salvage attempts at his foot versus below-knee amputation.  Risk and benefits of both were thoroughly discussed.  Discussed that salvage attempt is limited by his healing potential  and he would need a proximal transmetatarsal at best, and I did not advise simply performing a second toe amputation.  I discussed that should he opt an attempt to salvage his foot, I cannot guarantee success and that this would likely take a long time to heal if it were to heal at all.  Based upon our experience with healing his right foot wound, which has taken months to heal, I would expect a left foot transmetatarsal amputation to also take months to heal if he had sufficient vascularity.  We did discuss that below-knee amputation has a much better prognosis of healing potential.  Patient did have questions about the postoperative course of the below-knee amputation and I I recommended he discuss with Dr. Donnetta Hutching and team as I could not speak to the specifics as I do not perform that procedure.  I did discuss however that I agree with Dr. Donnetta Hutching that this has much better healing potential from a vascular standpoint. -At this point patient is leaning towards a below-knee amputation.  He wants to speak to his family and some of his buddies who have had the procedure before he makes his decision.  I encouraged him to discuss as needed and that I am available for questions should he have any further questions or should his family wish to speak to me.  I discussed that I am available to help however he chooses and either way we will continue to see him for his right foot wound which is making progress.  We will continue to follow and will follow up patient's decision.  [name] 03/13/2019, 9:37 AM

## 2019-03-13 NOTE — Progress Notes (Signed)
CSW acknowledges consult for assistance with medications at discharge. CSW will relay this information to the [facility]. Assisting with medications is outside of the CSW's scope of practice.   CSW will continue to follow.   Domenic Schwab, MSW, [place] Worker [facility]  [phone]

## 2019-03-13 NOTE — Consult Note (Addendum)
[facility] Renal Consultation Note    Indication for Consultation:  Management of ESRD/hemodialysis; anemia, hypertension/volume and secondary hyperparathyroidism PCP: [name], MD  HPI: Timothy Faulkner is a 73 y.o. male with ESRD on HD MWF at [facility], first starting in 03/2013.  Past medical history significant for HTN, NCIM s/p ICD, systolic HF, DM, HLD, gout and PVD s/t R 5th toe amputation and R TP trunk and peroneal stenting by Dr. Carlis Abbott in December 2019 and most recently was admitted 7/14 - 03/03/19 for , PAD with LLE critcal limb isch/cellulitis  s/p PTA /atherectomy- RLE  And on 03/08/19 had planned amputation of left 3rd and 4th toe with debridement of right foot lateral  ulcer and skin graft placement by [name]. Deep wound cultures done at that time + entobacter cloace final culture pending.  He dialyzed without sequelae since then. When his daughter was changing his dressing yesterday she notices left 2nd toe had turned dark. He had no fevers but some drainage. Dialysis treatments have been going well. No SOB, CP, N, V, D, edema. Appetite good. Plans for new AVGG once all infections healed - using TDC for now.  Past Medical History:  Diagnosis Date  . Anemia   . Arthritis    Gout  . Automatic implantable cardioverter-defibrillator in situ    [employer]  . CHF   . ESRD on dialysis [facility])    [name] 03/11/2013 (03/11/2013) dialysis M/W/F  . Gangrene ([place])    left foot  . GERD (gastroesophageal reflux disease)   . Gout    "once a year"  . Heart murmur   . HYPERCHOLESTEROLEMIA, MIXED   . HYPERTENSION   . Macular degeneration   . Osteomyelitis ([place])    right foot  . 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 Pacemaker   . Peripheral arterial disease (HCC)    left fifth toe ulcer, healing  . Pneumonia   . Shortness of breath   . 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 Wears dentures   . Wears glasses     Past Surgical History:  Procedure Laterality Date  . A/V FISTULAGRAM Left 07/20/2018   Procedure: A/V FISTULAGRAM;  Surgeon: [name], MD;  Location: [place] CV LAB;  Service: Cardiovascular;  Laterality: Left;  . ABDOMINAL AORTOGRAM N/A 02/25/2019   Procedure: ABDOMINAL AORTOGRAM;  Surgeon: Elam Dutch, MD;  Location: [place] [facility];  Service: Cardiovascular;  Laterality: N/A;  . ABDOMINAL AORTOGRAM W/LOWER EXTREMITY Bilateral 07/20/2018   Procedure: ABDOMINAL AORTOGRAM W/LOWER EXTREMITY;  Surgeon: [name], MD;  Location: [place] CV LAB;  Service: Cardiovascular;  Laterality: Bilateral;  . AMPUTATION Right 09/07/2018   Procedure: Right fifth metatarsectomy;  Surgeon: [name], DPM;  Location: [place];  Service: Podiatry;  Laterality: Right;  . AMPUTATION Left 03/08/2019   Procedure: AMPUTATION  3RD AND 4TH TOES LEFT FOOT;  Surgeon: [name], DPM;  Location: [place];  Service: Podiatry;  Laterality: Left;  . AV FISTULA PLACEMENT Right 12/13/2012   Procedure: ARTERIOVENOUS (AV) FISTULA CREATION;  Surgeon: [name], MD;  Location: [place];  Service: Vascular;  Laterality: Right;  Ultrasound guided  . AV FISTULA PLACEMENT Left 05/07/2016   Procedure: LEFT RADIOCEPHALIC ARTERIOVENOUS (AV) FISTULA CREATION;  Surgeon: [name], MD;  Location: [place];  Service: Vascular;  Laterality: Left;  . BASCILIC VEIN TRANSPOSITION Right 03/26/2016   Procedure:  RIGHT BASILIC VEIN TRANSPOSITION;  Surgeon: [name], MD;  Location: [place];  Service: Vascular;  Laterality: Right;  . CARDIAC CATHETERIZATION    . CARDIAC DEFIBRILLATOR PLACEMENT     Chemical engineer  . EYE SURGERY Bilateral    Cataract  . FISTULOGRAM Left 04/22/2018   Procedure: FISTULOGRAM UPPER EXTREMITY;  Surgeon: [name], MD;  Location: [place];  Service: Vascular;  Laterality: Left;  . I&D EXTREMITY Right 07/15/2018   Procedure: IRRIGATION AND DEBRIDEMENT RIGHT FOOT;  Surgeon: [name], DPM;  Location: [place];  Service: Podiatry;  Laterality: Right;  . INCISION AND DRAINAGE ABSCESS / HEMATOMA OF BURSA / KNEE / THIGH Left 2012   "knee" (03/11/2013)  . INSERT / REPLACE / REMOVE PACEMAKER    . INSERTION OF DIALYSIS CATHETER Left 04/22/2018   Procedure: INSERTION OF DIALYSIS CATHETER;  Surgeon: [name], MD;  Location: [place];  Service: Vascular;  Laterality: Left;  . IR FLUORO GUIDE CV LINE LEFT  07/15/2018  . IR PTA VENOUS EXCEPT DIALYSIS CIRCUIT  07/15/2018  . LOWER EXTREMITY ANGIOGRAPHY Right 07/21/2018   Procedure: LOWER EXTREMITY ANGIOGRAPHY;  Surgeon: [name], MD;  Location: [place] CV LAB;  Service: Cardiovascular;  Laterality: Right;  . LOWER EXTREMITY ANGIOGRAPHY Bilateral 02/25/2019   Procedure: Lower Extremity Angiography;  Surgeon: Elam Dutch, MD;  Location: [place] CV LAB;  Service: Cardiovascular;  Laterality: Bilateral;  . METATARSAL HEAD EXCISION Right 07/15/2018   Procedure: METATARSAL RESECTION;  Surgeon: [name], DPM;  Location: [place];  Service: Podiatry;  Laterality: Right;  . MULTIPLE TOOTH EXTRACTIONS    . PERIPHERAL VASCULAR ATHERECTOMY Right 02/28/2019   Procedure: PERIPHERAL VASCULAR ATHERECTOMY;  Surgeon: Waynetta Sandy, MD;  Location: [place] CV LAB;  Service: Cardiovascular;  Laterality: Right;  right tp trunk   . PERIPHERAL VASCULAR BALLOON ANGIOPLASTY Left 02/25/2019   Procedure: PERIPHERAL VASCULAR BALLOON ANGIOPLASTY;  Surgeon: Elam Dutch, MD;  Location: [place] CV LAB;  Service: Cardiovascular;  Laterality: Left;  tibial peroneal trunk and PT  . PERIPHERAL VASCULAR INTERVENTION Right 07/21/2018   Procedure: PERIPHERAL VASCULAR INTERVENTION;  Surgeon: [name], MD;  Location: [place] CV LAB;  Service: Cardiovascular;  Laterality: Right;  peroneal stents  . REVISON OF ARTERIOVENOUS FISTULA Right 5/63/8756   Procedure: Plication OF Right Arm ARTERIOVENOUS  FISTULA;  Surgeon: Elam Dutch, MD;  Location: [facility] OR;  Service: Vascular;  Laterality: Right;  . REVISON OF ARTERIOVENOUS FISTULA Left 04/22/2018   Procedure: REVISION OF RADIOCEPHALIC ARTERIOVENOUS FISTULA;  Surgeon: [name], MD;  Location: [place];  Service: Vascular;  Laterality: Left;  . SHUNTOGRAM N/A 05/31/2013   Procedure: [place];  Surgeon: [name], MD;  Location: [facility];  Service: Cardiovascular;  Laterality: N/A;  . UPPER EXTREMITY VENOGRAPHY Right 07/23/2018   Procedure: UPPER EXTREMITY VENOGRAPHY;  Surgeon: [name], MD;  Location: [place] CV LAB;  Service: Cardiovascular;  Laterality: Right;  . WOUND DEBRIDEMENT Right 07/17/2018   Procedure: Wound Debridement; Closure Filleted toe flap Right Foot;  Surgeon: [name], DPM;  Location: [place];  Service: Podiatry;  Laterality: Right;  . WOUND DEBRIDEMENT Right 09/07/2018   Procedure: Debridement Right Foot Wound, application of wound vac;  Surgeon: [name], DPM;  Location: [place];  Service: Podiatry;  Laterality: Right;  . WOUND DEBRIDEMENT Right 03/08/2019   Procedure: DEBRIDEMENT WOUND RIGHT FOOT;  Surgeon: [name], DPM;  Location: [place];  Service: Podiatry;  Laterality: Right;   Family History  Problem Relation Age of Onset  . Heart disease Father   . CAD Father    Social History:  reports that he quit smoking about 26 years ago. His smoking use included cigarettes. He has a 22.50 pack-year smoking history. He has never used smokeless tobacco. He reports previous alcohol use. He reports previous drug use. Drug: Marijuana. No Known Allergies Prior to Admission medications   Medication Sig Start Date End Date Taking? Authorizing Provider  acetaminophen (TYLENOL) 500 MG tablet Take 500 mg by mouth every 6 (six) hours as needed for mild pain or headache.   Yes [provider]  aspirin EC 81 MG tablet Take 81 mg by mouth daily.   Yes [provider]  AURYXIA 1 GM 210 MG(Fe) tablet Take 420 mg by mouth See admin instructions. Take 2 tablets (420 mg) by mouth with each meal and with each snack 11/12/16  Yes [provider]  calcitRIOL (ROCALTROL) 0.5 MCG capsule Take 1 capsule (0.5 mcg total) by mouth every Monday, Wednesday, and Friday with hemodialysis. 03/04/19  Yes Hongalgi, Lenis Dickinson, MD  cefTAZidime 1 g in dextrose 5 % 50 mL Inject 1 g into the vein every dialysis for up to 6 doses. Administer after dialysis or as per nephrologist. 03/10/19  Yes [name], DPM  clopidogrel (PLAVIX) 75 MG tablet Take 1 tablet (75 mg total) by mouth daily with breakfast. 07/23/18  Yes Irene Pap N, DO  colchicine 0.6 MG tablet Take 0.6 mg by mouth daily as needed (as directed for gout flares).  02/26/18  Yes [provider]  collagenase (SANTYL) ointment Apply 1 application topically daily. Apply to right foot wound daily cover with dry dressing. 07/27/18  Yes [name], DPM  midodrine (PROAMATINE) 10 MG tablet Take 10 mg by mouth every Monday, Wednesday, and Friday with hemodialysis.  04/26/18  Yes [provider]  oxyCODONE (ROXICODONE) 5 MG immediate release tablet Take 1 tablet (5 mg total) by mouth every 8 (eight) hours as needed for severe pain. 03/08/19 03/07/20 Yes [name], DPM  SENSIPAR 30 MG tablet Take 30 mg by mouth See admin instructions. Take 30 mg by mouth every other morning   Yes [provider]  Amino Acids-Protein Hydrolys (FEEDING SUPPLEMENT, PRO-STAT SUGAR FREE 64,) LIQD Take 30 mLs by mouth 2 (two) times daily. Patient not taking: Reported on 03/12/2019 03/03/19   Modena Jansky, MD  multivitamin (RENA-VIT) TABS tablet Take 1 tablet by mouth at bedtime. Patient not taking: Reported on 03/12/2019 07/23/18   Kayleen Memos, DO  Nutritional Supplements (FEEDING SUPPLEMENT, NEPRO CARB STEADY,) LIQD Take 237 mLs by mouth 2 (two) times daily between meals. 03/03/19   Hongalgi, Lenis Dickinson, MD   rosuvastatin (CRESTOR) 10 MG tablet Take 1 tablet (10 mg total) by mouth daily at 6 PM. Patient not taking: Reported on 03/12/2019 03/03/19   Modena Jansky, MD  Wound Dressings (MEDIHONEY WOUND/BURN DRESSING) GEL Apply to affected are 3 times a week, and cover with sterile dressing. 03/10/19   [name], DPM   Current Facility-Administered Medications  Medication Dose Route Frequency Provider Last Rate Last Dose  . acetaminophen (TYLENOL) tablet 650 mg  650 mg Oral Q6H PRN Karmen Bongo, MD   650 mg at 03/12/19 2225   Or  . acetaminophen (TYLENOL) suppository 650 mg  650 mg Rectal Q6H PRN Karmen Bongo, MD      .  aspirin EC tablet 81 mg  81 mg Oral Daily Karmen Bongo, MD      . Derrill Memo ON 03/14/2019] calcitRIOL (ROCALTROL) capsule 0.5 mcg  0.5 mcg Oral Q M,W,F-HD Karmen Bongo, MD      . calcium carbonate (dosed in mg elemental calcium) suspension 500 mg of elemental calcium  500 mg of elemental calcium Oral Q6H PRN Karmen Bongo, MD      . camphor-menthol [facility]) lotion 1 application  1 application Topical H7W PRN Karmen Bongo, MD       And  . hydrOXYzine (ATARAX/VISTARIL) tablet 25 mg  25 mg Oral Q8H PRN Karmen Bongo, MD      . Derrill Memo ON 03/14/2019] cinacalcet (SENSIPAR) tablet 30 mg  30 mg Oral Cathlean Sauer, MD      . collagenase (SANTYL) ointment 1 application  1 application Topical Daily Karmen Bongo, MD      . docusate sodium [facility]) enema 283 mg  1 enema Rectal PRN Karmen Bongo, MD      . feeding supplement (NEPRO CARB STEADY) liquid 237 mL  237 mL Oral BID BM Karmen Bongo, MD      . feeding supplement (NEPRO CARB STEADY) liquid 237 mL  237 mL Oral TID PRN Karmen Bongo, MD      . feeding supplement (PRO-STAT SUGAR FREE 64) liquid 30 mL  30 mL Oral BID Karmen Bongo, MD   30 mL at 03/12/19 2226  . ferric citrate (AURYXIA) tablet 420 mg  420 mg Oral TID WC Karmen Bongo, MD   420 mg at 03/13/19 0826  . ferric citrate (AURYXIA) tablet 420 mg   420 mg Oral With snacks Karmen Bongo, MD   420 mg at 03/12/19 2100  . heparin injection 5,000 Units  5,000 Units Subcutaneous Lynne Logan, MD   5,000 Units at 03/13/19 0554  . [START ON 03/14/2019] midodrine (PROAMATINE) tablet 10 mg  10 mg Oral Q M,W,F-HD Karmen Bongo, MD      . multivitamin (RENA-VIT) tablet 1 tablet  1 tablet Oral Ivery Quale, MD   1 tablet at 03/12/19 2225  . ondansetron (ZOFRAN) tablet 4 mg  4 mg Oral Q6H PRN Karmen Bongo, MD       Or  . ondansetron [facility]) injection 4 mg  4 mg Intravenous Q6H PRN Karmen Bongo, MD      . oxyCODONE (Oxy IR/ROXICODONE) immediate release tablet 5 mg  5 mg Oral Q8H PRN Karmen Bongo, MD      . piperacillin-tazobactam (ZOSYN) IVPB 3.375 g  3.375 g Intravenous Q12H Adhikari, Amrit, MD      . rosuvastatin (CRESTOR) tablet 10 mg  10 mg Oral q1800 Karmen Bongo, MD   10 mg at 03/12/19 2100  . sorbitol 70 % solution 30 mL  30 mL Oral PRN Karmen Bongo, MD      . Derrill Memo ON 03/14/2019] vancomycin (VANCOCIN) IVPB 750 mg/150 ml premix  750 mg Intravenous Q M,W,F-HD Masters, Jake Church, RPH      . zolpidem [facility]) tablet 5 mg  5 mg Oral QHS PRN Karmen Bongo, MD   5 mg at 03/12/19 2226   Labs: Basic Metabolic Panel: Recent Labs  Lab 03/08/19 0546 03/12/19 1216 03/13/19 0543  NA 136 133* 136  K 3.2* 3.9 3.5  CL 97* 94* 99  CO2 23 22 24   GLUCOSE 109* 87 78  BUN 26* 25* 34*  CREATININE 7.00* 5.97* 6.93*  CALCIUM 9.2 9.4 9.2   Liver Function Tests: Recent  Labs  Lab 03/12/19 1216  AST 17  ALT 6  ALKPHOS 66  BILITOT 0.9  PROT 8.1  ALBUMIN 3.0*   No results for input(s): LIPASE, AMYLASE in the last 168 hours. No results for input(s): AMMONIA in the last 168 hours. CBC: Recent Labs  Lab 03/08/19 0546 03/12/19 1216 03/13/19 0543  WBC 8.1 9.9 8.9  NEUTROABS  --  7.9*  --   HGB 12.0* 11.7* 11.7*  HCT 40.3 39.2 37.7*  MCV 103.9* 102.1* 98.2  PLT 320 356 340   Cardiac Enzymes: No results for input(s):  CKTOTAL, CKMB, CKMBINDEX, TROPONINI in the last 168 hours. CBG: Recent Labs  Lab 03/08/19 0648 03/08/19 0833 03/12/19 2018 03/13/19 0650 03/13/19 0751  GLUCAP 114* 108* 80 66* 92   Iron Studies: No results for input(s): IRON, TIBC, TRANSFERRIN, FERRITIN in the last 72 hours. Studies/Results: Dg Foot Complete Left  Result Date: 03/12/2019 CLINICAL DATA:  Discoloration of the left second toe. History of prior third and fourth toe amputation. EXAM: LEFT FOOT - COMPLETE 3+ VIEW COMPARISON:  Plain films the left 02/22/2019. FINDINGS: The third and fourth toes have been amputated since the prior examination. Dressing is in place. No bony destructive change or periosteal reaction. No acute abnormality. First MTP osteoarthritis noted. No soft tissue gas or radiopaque foreign body. Atherosclerosis is seen. 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 Electronically Signed   By: Inge Rise M.D.   On: 03/12/2019 14:14   ROS: As per HPI otherwise negative.  Physical Exam: Vitals:   03/12/19 1509 03/12/19 1600 03/12/19 1830 03/13/19 0904  BP: 98/70 123/77 116/75 122/80  Pulse: 84 88 88 90  Resp: 16  16 18   Temp:    98.2 F (36.8 C)  TempSrc:    Oral  SpO2: 100% 98% 98% 98%     General: WDWN NAD  Head: NCAT sclera not icteric MMM Neck: Supple.  Lungs: CTA bilaterally without wheezes, rales, or rhonchi. Breathing is unlabored. Heart: RRR with S1 S2.  Abdomen: obese soft NT + BS Lower extremities: no edema - bilateral feet wrapped Neuro: A & O  X 3. Moves all extremities spontaneously. Psych:  Responds to questions appropriately with a normal affect. Dialysis Access: right IJ [facility]  Dialysis Orders:  MWF [place] 4 hr 400/800  Right IJ TDC EDW 78 - left at 77.7 last HD 3 K 2.5 Ca bath heparin 5000 with 2000 mid tmt  Calcitriol 0.5 no Mircera or Fe Recent labs:  iPTH 248 Ca/P ok hgb 11.8 31% sat  Assessment/Plan: 1.  PAD with  critical limb ischemia - prior revascularization in July; seen by [facility] - no further vascular intervention available to improve circulation - He agrees to recommended left BKA. 2. Enterobacter wound culture 7/28 left foot - on Zosyn and Vanc now - afebrile  3. ESRD -  MWF - HD per routine - gradually losing wt - K 3.5 - start on 4 K bath Monday - and change to 3 if K >4 4. Hypertension/volume  - using midodrine for BP support  5. Anemia  -hgb 11.7 - no ESA/Fe 6. Metabolic bone disease -  Continue calcitriol/sensipar/Auryxia 7. Nutrition alb 3 renal 8. DM-  9. Hx NICM s/p ICD  Myriam Jacobson, PA-C [place] [phone] 03/13/2019, 10:13 AM

## 2019-03-13 NOTE — Progress Notes (Signed)
Subjective:  Patient ID: Timothy Faulkner, male    DOB: 05/19/1946,  MRN: [id number]  Chief Complaint  Patient presents with  . Wound Check    Right lateral wound check and left 3rd digit wound check. Left 3rd digit has interdigital abbrasion from band-aid rubbing on.    DOS: 07/15/18 Procedure:  1) Incision Bone Cortex for Osteomyelitis             2) Bone Biopsy Distal Right 5th Metatarsal             3) Metatarsectomy              4) Bone Biopsy Proximal Right 5th Metatarsal             5) Application of Wound VAC  DOS: 07/17/18 Procedure:  1) Bone Biopsy Left 5th Phalanx             2) Delayed Wound Closure - Complex             3) Filleted Toe Flap  DOS: 09/07/18 Procedure:  1) Debridement of Skin, Subcutaneous Tissue, and Bone             2) Partial 5th Metatarsectomy             3) Application of Wound VAC  73 y.o. male returns for wound care. Hx above confirmed with patient.  Review of Systems: Negative except as noted in the HPI. Denies N/V/F/Ch.  Past Medical History:  Diagnosis Date  . Anemia   . Arthritis    Gout  . Automatic implantable cardioverter-defibrillator in situ    [employer]  . CHF   . ESRD on dialysis [facility])    [name] 03/11/2013 (03/11/2013) dialysis M/W/F  . Gangrene ([place])    left foot  . GERD (gastroesophageal reflux disease)   . Gout    "once a year"  . Heart murmur   . HYPERCHOLESTEROLEMIA, MIXED   . HYPERTENSION   . Macular degeneration   . Osteomyelitis ([place])    right foot  . 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 Pacemaker   . Peripheral arterial disease (HCC)    left fifth toe ulcer, healing  . Pneumonia   . Shortness of breath   . 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 Wears dentures   . Wears glasses    No current facility-administered medications for this visit.  No current outpatient medications on file.  Facility-Administered Medications  Ordered in Other Visits:  .  acetaminophen (TYLENOL) tablet 650 mg, 650 mg, Oral, Q6H PRN, 650 mg at 03/12/19 2225 **OR** acetaminophen (TYLENOL) suppository 650 mg, 650 mg, Rectal, Q6H PRN, Karmen Bongo, MD .  aspirin EC tablet 81 mg, 81 mg, Oral, Daily, Karmen Bongo, MD, 81 mg at 03/13/19 1041 .  [START ON 03/14/2019] calcitRIOL (ROCALTROL) capsule 0.5 mcg, 0.5 mcg, Oral, Q M,W,F-HD, Karmen Bongo, MD .  calcium carbonate (dosed in mg elemental calcium) suspension 500 mg of elemental calcium, 500 mg of elemental calcium, Oral, Q6H PRN, Karmen Bongo, MD .  camphor-menthol [facility]) lotion 1 application, 1 application, Topical, X9K PRN **AND** hydrOXYzine (ATARAX/VISTARIL) tablet 25 mg, 25 mg, Oral, Q8H PRN, Karmen Bongo, MD .  Chlorhexidine Gluconate Cloth 2 % PADS 6 each, 6 each, Topical, Q0600, Alric Seton, PA-C, 6 each at 03/13/19 1650 .  [START ON 03/14/2019] cinacalcet (SENSIPAR) tablet 30 mg, 30 mg, Oral, Cathlean Sauer,  MD .  collagenase (SANTYL) ointment 1 application, 1 application, Topical, Daily, Karmen Bongo, MD .  docusate sodium [facility]) enema 283 mg, 1 enema, Rectal, PRN, Karmen Bongo, MD .  feeding supplement (NEPRO CARB STEADY) liquid 237 mL, 237 mL, Oral, BID BM, Karmen Bongo, MD .  feeding supplement (NEPRO CARB STEADY) liquid 237 mL, 237 mL, Oral, TID PRN, Karmen Bongo, MD .  feeding supplement (PRO-STAT SUGAR FREE 64) liquid 30 mL, 30 mL, Oral, BID, Karmen Bongo, MD, 30 mL at 03/13/19 2131 .  ferric citrate (AURYXIA) tablet 420 mg, 420 mg, Oral, TID WC, Karmen Bongo, MD, 420 mg at 03/13/19 1655 .  ferric citrate (AURYXIA) tablet 420 mg, 420 mg, Oral, With snacks, Karmen Bongo, MD, 420 mg at 03/13/19 2131 .  heparin injection 5,000 Units, 5,000 Units, Subcutaneous, Q8H, Karmen Bongo, MD, 5,000 Units at 03/13/19 2130 .  [START ON 03/14/2019] midodrine (PROAMATINE) tablet 10 mg, 10 mg, Oral, Q M,W,F-HD, Karmen Bongo, MD .  multivitamin  (RENA-VIT) tablet 1 tablet, 1 tablet, Oral, Ivery Quale, MD, 1 tablet at 03/13/19 2132 .  ondansetron (ZOFRAN) tablet 4 mg, 4 mg, Oral, Q6H PRN **OR** ondansetron (ZOFRAN) injection 4 mg, 4 mg, Intravenous, Q6H PRN, Karmen Bongo, MD .  oxyCODONE (Oxy IR/ROXICODONE) immediate release tablet 5 mg, 5 mg, Oral, Q8H PRN, Karmen Bongo, MD, 5 mg at 03/13/19 1910 .  piperacillin-tazobactam (ZOSYN) IVPB 3.375 g, 3.375 g, Intravenous, Q12H, Adhikari, Amrit, MD, Last Rate: 12.5 mL/hr at 03/13/19 2136, 3.375 g at 03/13/19 2136 .  rosuvastatin (CRESTOR) tablet 10 mg, 10 mg, Oral, q1800, Karmen Bongo, MD, 10 mg at 03/13/19 1655 .  sorbitol 70 % solution 30 mL, 30 mL, Oral, PRN, Karmen Bongo, MD .  Derrill Memo ON 03/14/2019] vancomycin (VANCOCIN) IVPB 750 mg/150 ml premix, 750 mg, Intravenous, Q M,W,F-HD, Masters, Jake Church, RPH .  zolpidem [facility]) tablet 5 mg, 5 mg, Oral, QHS PRN, Karmen Bongo, MD, 5 mg at 03/13/19 2132  Social History   Tobacco Use  Smoking Status Former Smoker  . Packs/day: 0.75  . Years: 30.00  . Pack years: 22.50  . Types: Cigarettes  . Quit date: 11/25/1992  . Years since quitting: 26.3  Smokeless Tobacco Never Used    No Known Allergies Objective:   Vitals:   02/10/19 1014  Temp: 97.9 F (36.6 C)   There is no height or weight on file to calculate BMI. Constitutional Well developed. Well nourished.  Vascular Foot warm and well perfused. Capillary refill normal to all digits.   Neurologic Normal speech. Oriented to person, place, and time. Epicritic sensation to light touch grossly present bilaterally.  Dermatologic Wound 2.5 x4 right foot with granular base. Wholly granular. No warmth, erythema, signs of acute infeciton.  Left 3rd toe with dry eschar and abrasion. No signs of infection.  Orthopedic: Tenderness to palpation noted about the surgical site. 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   Radiographs: None.  Assessment:   1.  Diabetic ulcer of toe of left foot associated with type 2 diabetes mellitus, with fat layer exposed ([place])   2. Diabetic ulcer of other part of right foot associated with diabetes mellitus due to underlying condition, limited to breakdown of skin [place])    Plan:  Patient was evaluated and treated and all questions answered.  S/p Right foot debridement -Wound continues to improve, albeit slowly. -Dress with collagen gel today. -Continue santyl with [facility] thrice weekly.  Ulcer left 3rd toe -Dressed with collagen gel -  Continue santyl -Discussed importance of padding in between the 3rd and 4th toes. -Will order [place] for santyl WTD  No follow-ups on file.

## 2019-03-13 NOTE — Progress Notes (Signed)
PROGRESS NOTE    Timothy Faulkner  GYJ:[id number] DOB: 1946-07-24 DOA: 03/12/2019 PCP: [name], MD   Brief Narrative:  Patient is a 73 year old male with history of diabetes mellitus, peripheral vascular disease status post revascularization procedure on 7/17and atherectomy on 7/20 with subsequent 3/4 toe amputation on the L foot 7/28; HTN; HLD; ESRD on MWF HD; AICD placement; and CHF presenting with R 2nd toe wound infection.  He presented to the emergency department after his podiatrist [name] recommended for evaluation of second toe which had turned dark.  Nephrology following for dialysis.  Started on broad-spectrum antibiotics for right second toe gangrene.  Assessment & Plan:   Principal Problem:   Critical lower limb ischemia Active Problems:   HYPERCHOLESTEROLEMIA, MIXED   Essential hypertension   ESRD (end stage renal disease) on dialysis (HCC)   Chronic systolic heart failure (HCC)   PAD (peripheral artery disease) (HCC)   Ulcer of right foot with fat layer exposed ([place])   Gangrene of toe of left foot (HCC)   Critical lower limb ischemia/left second toe gangrene: History of severe peripheral artery disease.  Past history as above.  Recent admission on 7/14-23 with critical limb ischemia and third/fourth toe amputation.  Status post revascularization procedure.  On aspirin and Plavix at home.  He also has wound on the lateral side of the right foot.  It is healing well with intact graft.   He Presented with black discoloration of his left second toe.  Second toe is gangrenous and there is poor wound healing of the third and fourth amputation.  Vascular surgery and podiatry following.  Plan for left BKA.  Continue broad-spectrum antibiotics for now.  Currently is not septic.  ESRD on dialysis: Next dialysis tomorrow.  Nephrology following.  Does not appear to be volume overloaded.  Chronic systolic CHF: Currently compensated.  Chronic hypotension: On Midodrin.   Currently blood pressure stable.  Diabetes mellitus type II: Continue current regimen.  Monitor CBGs.           DVT prophylaxis:Heparin [place] Code Status: Full Family Communication: None Disposition Plan: Undetermined at this point.  Waiting for surgical intervention   Consultants: Vascular surgery, podiatry  Procedures: None  Antimicrobials:  Anti-infectives (From admission, onward)   Start     Dose/Rate Route Frequency Ordered Stop   03/14/19 1200  vancomycin (VANCOCIN) IVPB 750 mg/150 ml premix     750 mg 150 mL/hr over 60 Minutes Intravenous Every M-W-F (Hemodialysis) 03/12/19 1819     03/13/19 1000  piperacillin-tazobactam (ZOSYN) IVPB 3.375 g     3.375 g 12.5 mL/hr over 240 Minutes Intravenous Every 12 hours 03/13/19 0813     03/12/19 1800  vancomycin (VANCOCIN) 1,500 mg in sodium chloride 0.9 % 500 mL IVPB     1,500 mg 250 mL/hr over 120 Minutes Intravenous  Once 03/12/19 1640 03/13/19 0113      Subjective: Patient seen and examined the bedside this morning.  Hemodynamically stable.  Comfortable.  Denies any new complaints.  Objective: Vitals:   03/12/19 1509 03/12/19 1600 03/12/19 1830 03/13/19 0904  BP: 98/70 123/77 116/75 122/80  Pulse: 84 88 88 90  Resp: 16  16 18   Temp:    98.2 F (36.8 C)  TempSrc:    Oral  SpO2: 100% 98% 98% 98%    Intake/Output Summary (Last 24 hours) at 03/13/2019 1043 Last data filed at 03/13/2019 0900 Gross per 24 hour  Intake 750 ml  Output 0 ml  Net  750 ml   There were no vitals filed for this visit.  Examination:  General exam:Not in distress,obese HEENT:PERRL,Oral mucosa moist, Ear/Nose normal on gross exam Respiratory system: Bilateral equal air entry, normal vesicular breath sounds, no wheezes or crackles  Cardiovascular system: S1 & S2 heard, RRR. No JVD, murmurs, rubs, gallops or clicks. No pedal edema. Gastrointestinal system: Abdomen is nondistended, soft and nontender. No organomegaly or masses felt. Normal bowel  sounds heard. Central nervous system: Alert and oriented. No focal neurological deficits. Extremities: Poor/nonpalpable peripheral pulses  Healing wound on the right lateral foot. Gangrenous left second toe, amputation wound of the left third and fourth toe  Data Reviewed: I have personally reviewed following labs and imaging studies  CBC: Recent Labs  Lab 03/08/19 0546 03/12/19 1216 03/13/19 0543  WBC 8.1 9.9 8.9  NEUTROABS  --  7.9*  --   HGB 12.0* 11.7* 11.7*  HCT 40.3 39.2 37.7*  MCV 103.9* 102.1* 98.2  PLT 320 356 578   Basic Metabolic Panel: Recent Labs  Lab 03/08/19 0546 03/12/19 1216 03/13/19 0543  NA 136 133* 136  K 3.2* 3.9 3.5  CL 97* 94* 99  CO2 23 22 24   GLUCOSE 109* 87 78  BUN 26* 25* 34*  CREATININE 7.00* 5.97* 6.93*  CALCIUM 9.2 9.4 9.2   GFR: Estimated Creatinine Clearance: 9.3 mL/min (A) (by C-G formula based on SCr of 6.93 mg/dL (H)). Liver Function Tests: Recent Labs  Lab 03/12/19 1216  AST 17  ALT 6  ALKPHOS 66  BILITOT 0.9  PROT 8.1  ALBUMIN 3.0*   No results for input(s): LIPASE, AMYLASE in the last 168 hours. No results for input(s): AMMONIA in the last 168 hours. Coagulation Profile: Recent Labs  Lab 03/12/19 1216  INR 1.2   Cardiac Enzymes: No results for input(s): CKTOTAL, CKMB, CKMBINDEX, TROPONINI in the last 168 hours. BNP (last 3 results) No results for input(s): PROBNP in the last 8760 hours. HbA1C: Recent Labs    03/12/19 1745  HGBA1C 5.2   CBG: Recent Labs  Lab 03/08/19 0648 03/08/19 0833 03/12/19 2018 03/13/19 0650 03/13/19 0751  GLUCAP 114* 108* 80 66* 92   Lipid Profile: No results for input(s): CHOL, HDL, LDLCALC, TRIG, CHOLHDL, LDLDIRECT in the last 72 hours. Thyroid Function Tests: No results for input(s): TSH, T4TOTAL, FREET4, T3FREE, THYROIDAB in the last 72 hours. Anemia Panel: No results for input(s): VITAMINB12, FOLATE, FERRITIN, TIBC, IRON, RETICCTPCT in the last 72 hours. Sepsis Labs:  Recent Labs  Lab 03/12/19 1500  LATICACIDVEN 1.6    Recent Results (from the past 240 hour(s))  SARS Coronavirus 2 (Performed in [place] hospital lab)     Status: None   Collection Time: 03/07/19 10:55 AM   Specimen: Nasal Swab  Result Value Ref Range Status   SARS Coronavirus 2 NEGATIVE NEGATIVE Final    Comment: (NOTE) SARS-CoV-2 target nucleic acids are NOT DETECTED. The SARS-CoV-2 RNA is generally detectable in upper and lower respiratory specimens during the acute phase of infection. Negative results do not preclude SARS-CoV-2 infection, do not rule out co-infections with other pathogens, and should not be used as the sole basis for treatment or other patient management decisions. Negative results must be combined with clinical observations, patient history, and epidemiological information. The expected result is Negative. Fact Sheet for Patients: SugarRoll.be Fact Sheet for Healthcare Providers: https://www.woods-mathews.com/ This test is not yet approved or cleared by the [place] FDA and  has been authorized for detection and/or diagnosis  of SARS-CoV-2 by FDA under an Emergency Use Authorization (EUA). This EUA will remain  in effect (meaning this test can be used) for the duration of the COVID-19 declaration under Section 56 4(b)(1) of the Act, 21 U.S.C. section 360bbb-3(b)(1), unless the authorization is terminated or revoked sooner. Performed at [facility], [place] [address], [place], [place] [address]   Aerobic/Anaerobic Culture (surgical/deep wound)     Status: None (Preliminary result)   Collection Time: 03/08/19  8:15 AM   Specimen: Soft Tissue, Other  Result Value Ref Range Status   Specimen Description FOOT LEFT  Final   Special Requests DEEP SOFT TISSUE CULTURE  Final   Gram Stain   Final    RARE WBC PRESENT, PREDOMINANTLY PMN FEW GRAM NEGATIVE RODS    Culture   Final    MODERATE ENTEROBACTER  CLOACAE NO ANAEROBES ISOLATED; CULTURE IN PROGRESS FOR 5 DAYS    Report Status PENDING  Incomplete   Organism ID, Bacteria ENTEROBACTER CLOACAE  Final      Susceptibility   Enterobacter cloacae - MIC*    CEFAZOLIN >=64 RESISTANT Resistant     CEFEPIME <=1 SENSITIVE Sensitive     CEFTAZIDIME <=1 SENSITIVE Sensitive     CEFTRIAXONE <=1 SENSITIVE Sensitive     CIPROFLOXACIN <=0.25 SENSITIVE Sensitive     GENTAMICIN <=1 SENSITIVE Sensitive     IMIPENEM <=0.25 SENSITIVE Sensitive     TRIMETH/SULFA <=20 SENSITIVE Sensitive     PIP/TAZO Value in next row Sensitive      <=4 SENSITIVEPerformed at [facility], [address], [place], [place] [address]    * MODERATE ENTEROBACTER CLOACAE  Blood culture (routine x 2)     Status: None (Preliminary result)   Collection Time: 03/12/19  1:00 PM   Specimen: BLOOD RIGHT WRIST  Result Value Ref Range Status   Specimen Description BLOOD RIGHT WRIST  Final   Special Requests   Final    BOTTLES DRAWN AEROBIC AND ANAEROBIC Blood Culture results may not be optimal due to an inadequate volume of blood received in culture bottles   Culture   Final    NO GROWTH < 24 HOURS Performed at [facility], [place] [address], [place], [place] [address]    Report Status PENDING  Incomplete  Blood culture (routine x 2)     Status: None (Preliminary result)   Collection Time: 03/12/19  2:50 PM   Specimen: BLOOD RIGHT HAND  Result Value Ref Range Status   Specimen Description BLOOD RIGHT HAND  Final   Special Requests   Final    BOTTLES DRAWN AEROBIC AND ANAEROBIC Blood Culture results may not be optimal due to an inadequate volume of blood received in culture bottles   Culture   Final    NO GROWTH < 24 HOURS Performed at [facility], [place] [address], [place], [place] [address]    Report Status PENDING  Incomplete  SARS CORONAVIRUS 2 Nasal Swab Aptima Multi Swab     Status: None   Collection Time: 03/12/19  5:18 PM   Specimen: Aptima Multi Swab;  Nasal Swab  Result Value Ref Range Status   SARS Coronavirus 2 NEGATIVE NEGATIVE Final    Comment: (NOTE) SARS-CoV-2 target nucleic acids are NOT DETECTED. The SARS-CoV-2 RNA is generally detectable in upper and lower respiratory specimens during the acute phase of infection. Negative results do not preclude SARS-CoV-2 infection, do not rule out co-infections with other pathogens, and should not  be used as the sole basis for treatment or other patient management decisions. Negative results must be combined with clinical observations, patient history, and epidemiological information. The expected result is Negative. Fact Sheet for Patients: SugarRoll.be Fact Sheet for Healthcare Providers: https://www.woods-mathews.com/ This test is not yet approved or cleared by the [place] FDA and  has been authorized for detection and/or diagnosis of SARS-CoV-2 by FDA under an Emergency Use Authorization (EUA). This EUA will remain  in effect (meaning this test can be used) for the duration of the COVID-19 declaration under Section 56 4(b)(1) of the Act, 21 U.S.C. section 360bbb-3(b)(1), unless the authorization is terminated or revoked sooner. Performed at [facility], [place] [address] [place], [place] [address]          Radiology Studies: Dg Foot Complete Left  Result Date: 03/12/2019 CLINICAL DATA:  Discoloration of the left second toe. History of prior third and fourth toe amputation. EXAM: LEFT FOOT - COMPLETE 3+ VIEW COMPARISON:  Plain films the left 02/22/2019. FINDINGS: The third and fourth toes have been amputated since the prior examination. Dressing is in place. No bony destructive change or periosteal reaction. No acute abnormality. First MTP osteoarthritis noted. No soft tissue gas or radiopaque foreign body. Atherosclerosis is seen. IMPRESSION: No acute abnormality Status post amputation of third and fourth toes. First MTP  osteoarthritis. Electronically Signed   By: Inge Rise M.D.   On: 03/12/2019 14:14        Scheduled Meds: . aspirin EC  81 mg Oral Daily  . [START ON 03/14/2019] calcitRIOL  0.5 mcg Oral Q M,W,F-HD  . [START ON 03/14/2019] cinacalcet  30 mg Oral QODAY  . collagenase  1 application Topical Daily  . feeding supplement (NEPRO CARB STEADY)  237 mL Oral BID BM  . feeding supplement (PRO-STAT SUGAR FREE 64)  30 mL Oral BID  . ferric citrate  420 mg Oral TID WC  . ferric citrate  420 mg Oral With snacks  . heparin  5,000 Units Subcutaneous Q8H  . [START ON 03/14/2019] midodrine  10 mg Oral Q M,W,F-HD  . multivitamin  1 tablet Oral QHS  . rosuvastatin  10 mg Oral q1800   Continuous Infusions: . piperacillin-tazobactam (ZOSYN)  IV    . [START ON 03/14/2019] vancomycin       LOS: 1 day    Time spent: 35 mins.More than 50% of that time was spent in counseling and/or coordination of care.      Shelly Coss, MD [facility] Pager [phone]  If 7PM-7AM, please contact night-coverage [email or web address] Password [facility] 03/13/2019, 10:43 AM

## 2019-03-13 NOTE — Consult Note (Signed)
[place] Nurse wound consult note Patient receiving care in [facility] 5M04. Reason for Consult: lower extremity wound Wound type: there are surgical wounds on both feet.  According to the record, Dr. Hardie Pulley, Podiatrist, performed surgery to both foot wounds on 03/08/19.  These wounds should be under his care.  Please consult [name] for follow up instructions for how he wishes these wounds to be managed.  Val Riles, RN, MSN, CWOCN, CNS-BC, pager [phone]

## 2019-03-14 LAB — BASIC METABOLIC PANEL
Anion gap: 18 — ABNORMAL HIGH (ref 5–15)
BUN: 51 mg/dL — ABNORMAL HIGH (ref 8–23)
CO2: 21 mmol/L — ABNORMAL LOW (ref 22–32)
Calcium: 9.5 mg/dL (ref 8.9–10.3)
Chloride: 95 mmol/L — ABNORMAL LOW (ref 98–111)
Creatinine, Ser: 8.67 mg/dL — ABNORMAL HIGH (ref 0.61–1.24)
GFR calc Af Amer: 6 mL/min — ABNORMAL LOW (ref >60)
GFR calc non Af Amer: 5 mL/min — ABNORMAL LOW (ref >60)
Glucose, Bld: 83 mg/dL (ref 70–99)
Potassium: 3.8 mmol/L (ref 3.5–5.1)
Sodium: 134 mmol/L — ABNORMAL LOW (ref 135–145)

## 2019-03-14 LAB — CBC
HCT: 37.8 % — ABNORMAL LOW (ref 39.0–52.0)
Hemoglobin: 11.5 g/dL — ABNORMAL LOW (ref 13.0–17.0)
MCH: 29.9 pg (ref 26.0–34.0)
MCHC: 30.4 g/dL (ref 30.0–36.0)
MCV: 98.4 fL (ref 80.0–100.0)
Platelets: 373 10*3/uL (ref 150–400)
RBC: 3.84 MIL/uL — ABNORMAL LOW (ref 4.22–5.81)
RDW: 16.9 % — ABNORMAL HIGH (ref 11.5–15.5)
WBC: 8.6 10*3/uL (ref 4.0–10.5)
nRBC: 0 % (ref 0.0–0.2)

## 2019-03-14 LAB — GLUCOSE, CAPILLARY
Glucose-Capillary: 65 mg/dL — ABNORMAL LOW (ref 70–99)
Glucose-Capillary: 82 mg/dL (ref 70–99)
Glucose-Capillary: 107 mg/dL — ABNORMAL HIGH (ref 70–99)

## 2019-03-14 MED ORDER — CHLORHEXIDINE GLUCONATE CLOTH 2 % EX PADS
6.0000 | MEDICATED_PAD | Freq: Every day | CUTANEOUS | Status: DC
  Administered 2019-03-15: 6 via TOPICAL

## 2019-03-14 MED ORDER — HEPARIN SODIUM (PORCINE) 1000 UNIT/ML IJ SOLN
INTRAMUSCULAR | Status: AC
  Filled 2019-03-14: qty 4

## 2019-03-14 MED ORDER — CALCITRIOL 0.5 MCG PO CAPS
ORAL_CAPSULE | ORAL | Status: AC
  Filled 2019-03-14: qty 1

## 2019-03-14 MED ORDER — OXYCODONE HCL 5 MG PO TABS
ORAL_TABLET | ORAL | Status: AC
  Filled 2019-03-14: qty 1

## 2019-03-14 MED ORDER — MIDODRINE HCL 5 MG PO TABS
ORAL_TABLET | ORAL | Status: AC
  Filled 2019-03-14: qty 2

## 2019-03-14 NOTE — Progress Notes (Addendum)
  Subjective:  Patient ID: Timothy Faulkner, male    DOB: October 04, 1945,  MRN: [id number]  Patient seen at bedside.  Resting comfortably, plan for BKA tomorrow. He told me he spoke to his family about the surgery and has no questions for me today. Objective:   Vitals:   03/14/19 1540 03/14/19 2019  BP: 104/60 (!) 90/51  Pulse: 92 90  Resp: 16 18  Temp:  98 F (36.7 C)  SpO2:     Right foot wound healing well with intact graft.  Wound base almost fully granular.  No drainage.  Right foot warm to touch  Left foot third fourth wound area with ischemic changes area feels fully desiccated with dry gangrene.  Appears demarcating.  Second toe ischemic with pallor also appears to be demarcating.  Dorsal distal foot wound darkening discoloration Assessment & Plan:  Patient was evaluated and treated and all questions answered.  Left second toe gangrene, PAD; poor healing of left third and fourth amputation sites; well-healing right foot wound -Patient is electing for BKA of the left lower extremity.  Patient accepting of the outcome and in good spirits.  Based upon the appearance of the third and fourth amputation sites I believe transmetatarsal likely would fail and below-knee amputation is the best option at this point. -Right lower extremity healing well wound graft appears intact.  Rehydrated with wound gel.  Dry sterile dressing applied.  We will continue to follow and check on status of the wound.  [name], DPM  Accessible via secure chat for questions or concerns.

## 2019-03-14 NOTE — Progress Notes (Addendum)
[facility] Progress Note   Subjective:   Seen on HD. Reports he is feeling well. Still c/o lower extremity pain. Denies SOB, dyspnea, CP, abdominal pain, N/V/D. Planned for left BKA tomorrow.   Objective Vitals:   03/14/19 1300 03/14/19 1330 03/14/19 1400 03/14/19 1430  BP: (!) 97/56 (!) 91/38 (!) 103/53 (!) 94/46  Pulse: 96 98 93 90  Resp: (!) 22 (!) 23 18 19   Temp:      TempSrc:      SpO2:      Weight:       Physical Exam General: Well developed male, alert and in NAD Heart: RRR, no murmurs rubs or gallops Lungs: CTA bilaterally without wheezing, rhonchi or rales Abdomen: Soft, non-tender, non-distended. +BS Extremities: No edema. L foot with gauze over amputation site Dialysis Access:  Right IJ [facility]  Additional Objective Labs: Basic Metabolic Panel: Recent Labs  Lab 03/12/19 1216 03/13/19 0543 03/14/19 0716  NA 133* 136 134*  K 3.9 3.5 3.8  CL 94* 99 95*  CO2 22 24 21*  GLUCOSE 87 78 83  BUN 25* 34* 51*  CREATININE 5.97* 6.93* 8.67*  CALCIUM 9.4 9.2 9.5   Liver Function Tests: Recent Labs  Lab 03/12/19 1216  AST 17  ALT 6  ALKPHOS 66  BILITOT 0.9  PROT 8.1  ALBUMIN 3.0*   CBC: Recent Labs  Lab 03/08/19 0546 03/12/19 1216 03/13/19 0543 03/14/19 0716  WBC 8.1 9.9 8.9 8.6  NEUTROABS  --  7.9*  --   --   HGB 12.0* 11.7* 11.7* 11.5*  HCT 40.3 39.2 37.7* 37.8*  MCV 103.9* 102.1* 98.2 98.4  PLT 320 356 340 373   Blood Culture    Component Value Date/Time   SDES BLOOD RIGHT HAND 03/12/2019 1450   SPECREQUEST  03/12/2019 1450    BOTTLES DRAWN AEROBIC AND ANAEROBIC Blood Culture results may not be optimal due to an inadequate volume of blood received in culture bottles   CULT  03/12/2019 1450    NO GROWTH 2 DAYS Performed at [facility], [place] [address], [place], [place] [address]    REPTSTATUS PENDING 03/12/2019 1450    CBG: Recent Labs  Lab 03/13/19 1143 03/13/19 1642 03/13/19 2101 03/14/19 0702 03/14/19 0727   GLUCAP 109* 137* 111* 65* 82   Medications: . piperacillin-tazobactam (ZOSYN)  IV 3.375 g (03/13/19 2136)  . vancomycin     . aspirin EC  81 mg Oral Daily  . calcitRIOL      . calcitRIOL  0.5 mcg Oral Q M,W,F-HD  . Chlorhexidine Gluconate Cloth  6 each Topical Q0600  . Chlorhexidine Gluconate Cloth  6 each Topical Q0600  . cinacalcet  30 mg Oral QODAY  . collagenase  1 application Topical Daily  . feeding supplement (NEPRO CARB STEADY)  237 mL Oral BID BM  . feeding supplement (PRO-STAT SUGAR FREE 64)  30 mL Oral BID  . ferric citrate  420 mg Oral TID WC  . ferric citrate  420 mg Oral With snacks  . heparin      . heparin  5,000 Units Subcutaneous Q8H  . midodrine      . midodrine  10 mg Oral Q M,W,F-HD  . multivitamin  1 tablet Oral QHS  . oxyCODONE      . rosuvastatin  10 mg Oral q1800    Dialysis Orders:  MWF [facility] 4 hr 400/800  Right IJ TDC EDW 78 - left at 77.7 last HD 3  K 2.5 Ca bath heparin 5000 with 2000 mid tmt  Calcitriol 0.5 no Mircera or Fe Recent labs:  iPTH 248 Ca/P ok hgb 11.8 31% sat  Assessment/Plan: 1.  PAD with critical limb ischemia - prior revascularization in July; seen by [facility] - no further vascular intervention available to improve circulation - He agrees to recommended left BKA. 2. Enterobacter wound culture 7/28 left foot - on Zosyn and Vanc now - afebrile  3. ESRD -  MWF, continue regular schedule. gradually losing wt - K 3.8, using added K bath.  4. Hypertension/volume  - using midodrine for BP support. Systolic BP in 25'Q on dialysis. No edema or SOB.  5. Anemia  -hgb 11.5, no ESA/Fe indicated at this time.  6. Metabolic bone disease -  Continue calcitriol/sensipar/Auryxia 7. Nutrition alb 3 renal 8. DM-  9. Hx NICM s/p ICD  Anice Paganini, PA-C 03/14/2019, 3:33 PM  [facility] Pager: [phone]  Pt seen, examined and agree w A/P as above.  [name]  MD 03/14/2019, 5:59 PM

## 2019-03-14 NOTE — H&P (View-Only) (Signed)
Patient ID: Timothy Faulkner, male   DOB: [birth year], 73 y.o.   MRN: [id number] Currently on hemodialysis.  Comfortable. No change in his left foot.  Nonhealing third and fourth toe amputation with dusky dorsum of his foot Again discussed plan for left below-knee amputation tomorrow with Dr. Oneida Alar.

## 2019-03-14 NOTE — Progress Notes (Signed)
Initial Nutrition Assessment  DOCUMENTATION CODES:   Not applicable  INTERVENTION:    Nepro Shake po BID, each supplement provides 425 kcal and 19 grams protein  30 ml Prostat BID, each supplement provides 100 kcals and 15 grams protein.   Renal MVI daily  NUTRITION DIAGNOSIS:   Increased nutrient needs related to wound healing as evidenced by estimated needs.  GOAL:   Patient will meet greater than or equal to 90% of their needs   MONITOR:   PO intake, Supplement acceptance, Skin, Weight trends, Labs, I & O's  REASON FOR ASSESSMENT:        ASSESSMENT:   Patient with PMH significant for DM. PVD s/p revascularization 7/17, s/p atherectomy 7/20, L 3rd toe amputation 7/28, HTN, HLD, ESRD on HD, AICD, and CHF. Presents this admission with critical left lower limb ischemia.   RD working remotely.  Plan for L BKA tomorrow.   Pt in HD at time of RD assessment. Pt seen 7/20 by clinical nutrition. Pt had a good appetite prior to last admission, consumed 3 meals daily. Meal completions charted as 75-10% for pt's last 5 meals. He was provided with Nepro and Prostat previously. Will continue this admission.   EDW: 78 Current weight: 80.3 kg   Per nephrology pt is gradually losing wt. Will need to obtain recent nutrition/weight history if possible.   I/O: +1,705 ml since admit  Medications: calcitriol, sensipar, rena-vit Labs: Na 134 (L) CBG 65-137   Diet Order:   Diet Order            Diet NPO time specified Except for: Sips with Meds  Diet effective midnight        Diet NPO time specified Except for: Sips with Meds  Diet effective midnight        Diet renal with fluid restriction Fluid restriction: 1200 mL Fluid; Room service appropriate? Yes; Fluid consistency: Thin  Diet effective now              EDUCATION NEEDS:   Not appropriate for education at this time  Skin:  Skin Assessment: Skin Integrity Issues: Skin Integrity Issues:: Diabetic Ulcer,  Incisions Diabetic Ulcer: L toe, R foot Incisions: L/R foot  Last BM:  8/1  Height:   Ht Readings from Last 1 Encounters:  03/08/19 5\' 8"  (1.727 m)    Weight:   Wt Readings from Last 1 Encounters:  03/14/19 80.3 kg    Ideal Body Weight:  70 kg  BMI:  Body mass index is 26.92 kg/m.  Estimated Nutritional Needs:   Kcal:  2200-2400 kcal  Protein:  115-130 grams  Fluid:  1000 ml + UOP   Mariana Single RD, LDN Clinical Nutrition Pager # [phone]

## 2019-03-14 NOTE — Progress Notes (Signed)
Patient ID: Timothy Faulkner, male   DOB: 1946-04-13, 73 y.o.   MRN: [id number] Currently on hemodialysis.  Comfortable. No change in his left foot.  Nonhealing third and fourth toe amputation with dusky dorsum of his foot Again discussed plan for left below-knee amputation tomorrow with Dr. Oneida Alar.

## 2019-03-14 NOTE — Progress Notes (Signed)
PROGRESS NOTE    Timothy Faulkner  VZD:[id number] DOB: 25-Apr-1946 DOA: 03/12/2019 PCP: [name], MD   Brief Narrative:  Patient is a 73 year old male with history of diabetes mellitus, peripheral vascular disease status post revascularization procedure on 7/17and atherectomy on 7/20 with subsequent 3/4 toe amputation on the L foot 7/28; HTN; HLD; ESRD on MWF HD; AICD placement; and CHF presenting with R 2nd toe wound infection.  He presented to the emergency department after his podiatrist [name] recommended for evaluation of second toe which had turned dark.  Nephrology following for dialysis.  Started on broad-spectrum antibiotics for right second toe gangrene.Plan for left BKA tomorrow by vascular surgery.  Assessment & Plan:   Principal Problem:   Critical lower limb ischemia Active Problems:   HYPERCHOLESTEROLEMIA, MIXED   Essential hypertension   ESRD (end stage renal disease) on dialysis (HCC)   Chronic systolic heart failure (HCC)   PAD (peripheral artery disease) (HCC)   Ulcer of right foot with fat layer exposed ([place])   Gangrene of toe of left foot (HCC)   Critical lower limb ischemia/left second toe gangrene: History of severe peripheral artery disease.  Past history as above.  Recent admission on 7/14-23 with critical limb ischemia and third/fourth toe amputation.  Status post revascularization procedure.  On aspirin and Plavix at home.  He also has wound on the lateral side of the right foot.  It is healing well with intact graft.   He Presented with black discoloration of his left second toe.  Second toe is gangrenous and there is poor wound healing of the third and fourth amputation.  Vascular surgery and podiatry following.  Plan for left BKA tomorrow.  Continue broad-spectrum antibiotics for now.  Currently is not septic.  ESRD on dialysis: Next dialysis todat.  Nephrology following.  Does not appear to be volume overloaded.  Chronic systolic CHF: Currently  compensated.  Chronic hypotension: On Midodrin.  Currently blood pressure stable.  Diabetes mellitus type II: Continue current regimen.  Monitor CBGs.           DVT prophylaxis:Heparin [place] Code Status: Full Family Communication: None Disposition Plan: Likely home after surgical intervention  Consultants: Vascular surgery, podiatry  Procedures: None  Antimicrobials:  Anti-infectives (From admission, onward)   Start     Dose/Rate Route Frequency Ordered Stop   03/14/19 1200  vancomycin (VANCOCIN) IVPB 750 mg/150 ml premix     750 mg 150 mL/hr over 60 Minutes Intravenous Every M-W-F (Hemodialysis) 03/12/19 1819     03/13/19 1000  piperacillin-tazobactam (ZOSYN) IVPB 3.375 g     3.375 g 12.5 mL/hr over 240 Minutes Intravenous Every 12 hours 03/13/19 0813     03/12/19 1800  vancomycin (VANCOCIN) 1,500 mg in sodium chloride 0.9 % 500 mL IVPB     1,500 mg 250 mL/hr over 120 Minutes Intravenous  Once 03/12/19 1640 03/13/19 0113      Subjective: Patient seen and examined the bedside this morning.  Hemodynamically stable.  Comfortable.  Little emotional/anxious about the upcoming surgery.  No new complaints.  Objective: Vitals:   03/13/19 1646 03/13/19 2104 03/14/19 0555 03/14/19 0924  BP: 95/66 99/68 128/86 118/82  Pulse: 91 88 86 88  Resp: 18 18 20 18   Temp: 98.6 F (37 C) 98.2 F (36.8 C) 97.6 F (36.4 C) (!) 97.3 F (36.3 C)  TempSrc: Oral Oral Oral   SpO2: 90% 99% 93% 98%    Intake/Output Summary (Last 24 hours) at 03/14/2019 1109 Last  data filed at 03/14/2019 0700 Gross per 24 hour  Intake 954.99 ml  Output 0 ml  Net 954.99 ml   There were no vitals filed for this visit.  Examination:  General exam: Appears calm and comfortable ,Not in distress,obese HEENT:PERRL,Oral mucosa moist, Ear/Nose normal on gross exam Respiratory system: Bilateral equal air entry, normal vesicular breath sounds, no wheezes or crackles  Cardiovascular system: S1 & S2 heard, RRR. No  JVD, murmurs, rubs, gallops or clicks. Gastrointestinal system: Abdomen is nondistended, soft and nontender. No organomegaly or masses felt. Normal bowel sounds heard. Central nervous system: Alert and oriented. No focal neurological deficits Extremities: Poor/nonpalpable peripheral pulses  Healing wound on the right lateral foot. Gangrenous left second toe, amputation wound of the left third and fourth toe  Data Reviewed: I have personally reviewed following labs and imaging studies  CBC: Recent Labs  Lab 03/08/19 0546 03/12/19 1216 03/13/19 0543 03/14/19 0716  WBC 8.1 9.9 8.9 8.6  NEUTROABS  --  7.9*  --   --   HGB 12.0* 11.7* 11.7* 11.5*  HCT 40.3 39.2 37.7* 37.8*  MCV 103.9* 102.1* 98.2 98.4  PLT 320 356 340 856   Basic Metabolic Panel: Recent Labs  Lab 03/08/19 0546 03/12/19 1216 03/13/19 0543 03/14/19 0716  NA 136 133* 136 134*  K 3.2* 3.9 3.5 3.8  CL 97* 94* 99 95*  CO2 23 22 24  21*  GLUCOSE 109* 87 78 83  BUN 26* 25* 34* 51*  CREATININE 7.00* 5.97* 6.93* 8.67*  CALCIUM 9.2 9.4 9.2 9.5   GFR: Estimated Creatinine Clearance: 7.5 mL/min (A) (by C-G formula based on SCr of 8.67 mg/dL (H)). Liver Function Tests: Recent Labs  Lab 03/12/19 1216  AST 17  ALT 6  ALKPHOS 66  BILITOT 0.9  PROT 8.1  ALBUMIN 3.0*   No results for input(s): LIPASE, AMYLASE in the last 168 hours. No results for input(s): AMMONIA in the last 168 hours. Coagulation Profile: Recent Labs  Lab 03/12/19 1216  INR 1.2   Cardiac Enzymes: No results for input(s): CKTOTAL, CKMB, CKMBINDEX, TROPONINI in the last 168 hours. BNP (last 3 results) No results for input(s): PROBNP in the last 8760 hours. HbA1C: Recent Labs    03/12/19 1745  HGBA1C 5.2   CBG: Recent Labs  Lab 03/13/19 1143 03/13/19 1642 03/13/19 2101 03/14/19 0702 03/14/19 0727  GLUCAP 109* 137* 111* 65* 82   Lipid Profile: No results for input(s): CHOL, HDL, LDLCALC, TRIG, CHOLHDL, LDLDIRECT in the last 72  hours. Thyroid Function Tests: No results for input(s): TSH, T4TOTAL, FREET4, T3FREE, THYROIDAB in the last 72 hours. Anemia Panel: No results for input(s): VITAMINB12, FOLATE, FERRITIN, TIBC, IRON, RETICCTPCT in the last 72 hours. Sepsis Labs: Recent Labs  Lab 03/12/19 1500  LATICACIDVEN 1.6    Recent Results (from the past 240 hour(s))  SARS Coronavirus 2 (Performed in [place] hospital lab)     Status: None   Collection Time: 03/07/19 10:55 AM   Specimen: Nasal Swab  Result Value Ref Range Status   SARS Coronavirus 2 NEGATIVE NEGATIVE Final    Comment: (NOTE) SARS-CoV-2 target nucleic acids are NOT DETECTED. The SARS-CoV-2 RNA is generally detectable in upper and lower respiratory specimens during the acute phase of infection. Negative results do not preclude SARS-CoV-2 infection, do not rule out co-infections with other pathogens, and should not be used as the sole basis for treatment or other patient management decisions. Negative results must be combined with clinical observations, patient history,  and epidemiological information. The expected result is Negative. Fact Sheet for Patients: SugarRoll.be Fact Sheet for Healthcare Providers: https://www.woods-mathews.com/ This test is not yet approved or cleared by the [place] FDA and  has been authorized for detection and/or diagnosis of SARS-CoV-2 by FDA under an Emergency Use Authorization (EUA). This EUA will remain  in effect (meaning this test can be used) for the duration of the COVID-19 declaration under Section 56 4(b)(1) of the Act, 21 U.S.C. section 360bbb-3(b)(1), unless the authorization is terminated or revoked sooner. Performed at [facility], [place] [address], [place], [place] [address]   Aerobic/Anaerobic Culture (surgical/deep wound)     Status: None   Collection Time: 03/08/19  8:15 AM   Specimen: Soft Tissue, Other  Result Value Ref Range Status    Specimen Description FOOT LEFT  Final   Special Requests DEEP SOFT TISSUE CULTURE  Final   Gram Stain   Final    RARE WBC PRESENT, PREDOMINANTLY PMN FEW GRAM NEGATIVE RODS    Culture   Final    MODERATE ENTEROBACTER CLOACAE NO ANAEROBES ISOLATED Performed at [facility], [place] [address] [place], [place] [address]    Report Status 03/13/2019 FINAL  Final   Organism ID, Bacteria ENTEROBACTER CLOACAE  Final      Susceptibility   Enterobacter cloacae - MIC*    CEFAZOLIN >=64 RESISTANT Resistant     CEFEPIME <=1 SENSITIVE Sensitive     CEFTAZIDIME <=1 SENSITIVE Sensitive     CEFTRIAXONE <=1 SENSITIVE Sensitive     CIPROFLOXACIN <=0.25 SENSITIVE Sensitive     GENTAMICIN <=1 SENSITIVE Sensitive     IMIPENEM <=0.25 SENSITIVE Sensitive     TRIMETH/SULFA <=20 SENSITIVE Sensitive     PIP/TAZO <=4 SENSITIVE Sensitive     * MODERATE ENTEROBACTER CLOACAE  Blood culture (routine x 2)     Status: None (Preliminary result)   Collection Time: 03/12/19  1:00 PM   Specimen: BLOOD RIGHT WRIST  Result Value Ref Range Status   Specimen Description BLOOD RIGHT WRIST  Final   Special Requests   Final    BOTTLES DRAWN AEROBIC AND ANAEROBIC Blood Culture results may not be optimal due to an inadequate volume of blood received in culture bottles   Culture   Final    NO GROWTH 2 DAYS Performed at [facility], [place] [address], [place], [place] [address]    Report Status PENDING  Incomplete  Blood culture (routine x 2)     Status: None (Preliminary result)   Collection Time: 03/12/19  2:50 PM   Specimen: BLOOD RIGHT HAND  Result Value Ref Range Status   Specimen Description BLOOD RIGHT HAND  Final   Special Requests   Final    BOTTLES DRAWN AEROBIC AND ANAEROBIC Blood Culture results may not be optimal due to an inadequate volume of blood received in culture bottles   Culture   Final    NO GROWTH 2 DAYS Performed at [facility], [place] [address] [place], [place] [address]     Report Status PENDING  Incomplete  SARS CORONAVIRUS 2 Nasal Swab Aptima Multi Swab     Status: None   Collection Time: 03/12/19  5:18 PM   Specimen: Aptima Multi Swab; Nasal Swab  Result Value Ref Range Status   SARS Coronavirus 2 NEGATIVE NEGATIVE Final    Comment: (NOTE) SARS-CoV-2 target nucleic acids are NOT DETECTED. The SARS-CoV-2 RNA is generally detectable in upper and lower respiratory  specimens during the acute phase of infection. Negative results do not preclude SARS-CoV-2 infection, do not rule out co-infections with other pathogens, and should not be used as the sole basis for treatment or other patient management decisions. Negative results must be combined with clinical observations, patient history, and epidemiological information. The expected result is Negative. Fact Sheet for Patients: SugarRoll.be Fact Sheet for Healthcare Providers: https://www.woods-mathews.com/ This test is not yet approved or cleared by the [place] FDA and  has been authorized for detection and/or diagnosis of SARS-CoV-2 by FDA under an Emergency Use Authorization (EUA). This EUA will remain  in effect (meaning this test can be used) for the duration of the COVID-19 declaration under Section 56 4(b)(1) of the Act, 21 U.S.C. section 360bbb-3(b)(1), unless the authorization is terminated or revoked sooner. Performed at [facility], [place] [address] [place], [place] [address]          Radiology Studies: Dg Foot Complete Left  Result Date: 03/12/2019 CLINICAL DATA:  Discoloration of the left second toe. History of prior third and fourth toe amputation. EXAM: LEFT FOOT - COMPLETE 3+ VIEW COMPARISON:  Plain films the left 02/22/2019. FINDINGS: The third and fourth toes have been amputated since the prior examination. Dressing is in place. No bony destructive change or periosteal reaction. No acute abnormality. First MTP osteoarthritis noted.  No soft tissue gas or radiopaque foreign body. Atherosclerosis is seen. 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 Electronically Signed   By: Inge Rise M.D.   On: 03/12/2019 14:14        Scheduled Meds: . aspirin EC  81 mg Oral Daily  . calcitRIOL  0.5 mcg Oral Q M,W,F-HD  . Chlorhexidine Gluconate Cloth  6 each Topical Q0600  . Chlorhexidine Gluconate Cloth  6 each Topical Q0600  . cinacalcet  30 mg Oral QODAY  . collagenase  1 application Topical Daily  . feeding supplement (NEPRO CARB STEADY)  237 mL Oral BID BM  . feeding supplement (PRO-STAT SUGAR FREE 64)  30 mL Oral BID  . ferric citrate  420 mg Oral TID WC  . ferric citrate  420 mg Oral With snacks  . heparin  5,000 Units Subcutaneous Q8H  . midodrine  10 mg Oral Q M,W,F-HD  . multivitamin  1 tablet Oral QHS  . rosuvastatin  10 mg Oral q1800   Continuous Infusions: . piperacillin-tazobactam (ZOSYN)  IV 3.375 g (03/13/19 2136)  . vancomycin       LOS: 2 days    Time spent: 35 mins.More than 50% of that time was spent in counseling and/or coordination of care.      Shelly Coss, MD [facility] Pager [phone]  If 7PM-7AM, please contact night-coverage [email or web address] Password [email or web address] 03/14/2019, 11:09 AM

## 2019-03-15 ENCOUNTER — Encounter (HOSPITAL_COMMUNITY)
Admission: EM | Disposition: A | Discharge status: Rehabilitation Facility | Payer: Self-pay | Source: Home / Self Care | Attending: Internal Medicine

## 2019-03-15 ENCOUNTER — Inpatient Hospital Stay (HOSPITAL_COMMUNITY): Payer: Medicare Other | Admitting: Certified Registered Nurse Anesthetist

## 2019-03-15 ENCOUNTER — Encounter (HOSPITAL_COMMUNITY): Payer: Self-pay | Admitting: Certified Registered Nurse Anesthetist

## 2019-03-15 ENCOUNTER — Telehealth: Payer: Self-pay | Admitting: Podiatry

## 2019-03-15 DIAGNOSIS — I96 Gangrene, not elsewhere classified: Secondary | ICD-10-CM

## 2019-03-15 HISTORY — PX: AMPUTATION: SHX166

## 2019-03-15 LAB — GLUCOSE, CAPILLARY
Glucose-Capillary: 89 mg/dL (ref 70–99)
Glucose-Capillary: 97 mg/dL (ref 70–99)

## 2019-03-15 LAB — CBC
HCT: 37 % — ABNORMAL LOW (ref 39.0–52.0)
Hemoglobin: 11.2 g/dL — ABNORMAL LOW (ref 13.0–17.0)
MCH: 30.4 pg (ref 26.0–34.0)
MCHC: 30.3 g/dL (ref 30.0–36.0)
MCV: 100.3 fL — ABNORMAL HIGH (ref 80.0–100.0)
Platelets: 364 10*3/uL (ref 150–400)
RBC: 3.69 MIL/uL — ABNORMAL LOW (ref 4.22–5.81)
RDW: 17.1 % — ABNORMAL HIGH (ref 11.5–15.5)
WBC: 10.2 10*3/uL (ref 4.0–10.5)
nRBC: 0 % (ref 0.0–0.2)

## 2019-03-15 LAB — BASIC METABOLIC PANEL
Anion gap: 16 — ABNORMAL HIGH (ref 5–15)
BUN: 31 mg/dL — ABNORMAL HIGH (ref 8–23)
CO2: 23 mmol/L (ref 22–32)
Calcium: 9.2 mg/dL (ref 8.9–10.3)
Chloride: 97 mmol/L — ABNORMAL LOW (ref 98–111)
Creatinine, Ser: 6.15 mg/dL — ABNORMAL HIGH (ref 0.61–1.24)
GFR calc Af Amer: 10 mL/min — ABNORMAL LOW (ref >60)
GFR calc non Af Amer: 8 mL/min — ABNORMAL LOW (ref >60)
Glucose, Bld: 71 mg/dL (ref 70–99)
Potassium: 4.1 mmol/L (ref 3.5–5.1)
Sodium: 136 mmol/L (ref 135–145)

## 2019-03-15 LAB — PROTIME-INR
INR: 1.2 (ref 0.8–1.2)
Prothrombin Time: 15.5 seconds — ABNORMAL HIGH (ref 11.4–15.2)

## 2019-03-15 SURGERY — AMPUTATION BELOW KNEE
Anesthesia: General | Site: Leg Lower | Laterality: Left

## 2019-03-15 MED ORDER — POTASSIUM CHLORIDE CRYS ER 20 MEQ PO TBCR: 20.0000 meq | EXTENDED_RELEASE_TABLET | Freq: Every day | ORAL | Status: DC | PRN

## 2019-03-15 MED ORDER — SODIUM CHLORIDE 0.9 % IV SOLN
INTRAVENOUS | Status: DC | PRN
  Administered 2019-03-15: 07:00:00 via INTRAVENOUS

## 2019-03-15 MED ORDER — 0.9 % SODIUM CHLORIDE (POUR BTL) OPTIME
TOPICAL | Status: DC | PRN
  Administered 2019-03-15: 1000 mL

## 2019-03-15 MED ORDER — ACETAMINOPHEN 10 MG/ML IV SOLN
INTRAVENOUS | Status: AC
  Filled 2019-03-15: qty 100

## 2019-03-15 MED ORDER — EPHEDRINE SULFATE-NACL 50-0.9 MG/10ML-% IV SOSY
PREFILLED_SYRINGE | INTRAVENOUS | Status: DC | PRN
  Administered 2019-03-15: 5 mg via INTRAVENOUS

## 2019-03-15 MED ORDER — ONDANSETRON HCL 4 MG/2ML IJ SOLN
INTRAMUSCULAR | Status: AC
  Filled 2019-03-15: qty 2

## 2019-03-15 MED ORDER — DEXAMETHASONE SODIUM PHOSPHATE 10 MG/ML IJ SOLN
INTRAMUSCULAR | Status: AC
  Filled 2019-03-15: qty 1

## 2019-03-15 MED ORDER — FENTANYL CITRATE (PF) 250 MCG/5ML IJ SOLN
INTRAMUSCULAR | Status: AC
  Filled 2019-03-15: qty 5

## 2019-03-15 MED ORDER — FENTANYL CITRATE (PF) 100 MCG/2ML IJ SOLN
INTRAMUSCULAR | Status: DC | PRN
  Administered 2019-03-15 (×5): 50 ug via INTRAVENOUS

## 2019-03-15 MED ORDER — ACETAMINOPHEN 10 MG/ML IV SOLN
1000.0000 mg | Freq: Once | INTRAVENOUS | Status: AC
  Administered 2019-03-15: 1000 mg via INTRAVENOUS

## 2019-03-15 MED ORDER — NALOXONE HCL 0.4 MG/ML IJ SOLN
INTRAMUSCULAR | Status: DC | PRN
  Administered 2019-03-15: 80 ug via INTRAVENOUS

## 2019-03-15 MED ORDER — MAGNESIUM SULFATE 2 GM/50ML IV SOLN
2.0000 g | Freq: Every day | INTRAVENOUS | Status: DC | PRN
  Filled 2019-03-15: qty 50

## 2019-03-15 MED ORDER — MORPHINE SULFATE (PF) 2 MG/ML IV SOLN
2.0000 mg | INTRAVENOUS | Status: DC | PRN
  Administered 2019-03-15 – 2019-03-17 (×9): 2 mg via INTRAVENOUS
  Filled 2019-03-15 (×10): qty 1

## 2019-03-15 MED ORDER — PHENYLEPHRINE 40 MCG/ML (10ML) SYRINGE FOR IV PUSH (FOR BLOOD PRESSURE SUPPORT)
PREFILLED_SYRINGE | INTRAVENOUS | Status: AC
  Filled 2019-03-15: qty 10

## 2019-03-15 MED ORDER — DOCUSATE SODIUM 100 MG PO CAPS
100.0000 mg | ORAL_CAPSULE | Freq: Every day | ORAL | Status: DC
  Administered 2019-03-16 – 2019-03-17 (×2): 100 mg via ORAL
  Filled 2019-03-15 (×4): qty 1

## 2019-03-15 MED ORDER — PROPOFOL 10 MG/ML IV BOLUS
INTRAVENOUS | Status: DC | PRN
  Administered 2019-03-15: 30 mg via INTRAVENOUS
  Administered 2019-03-15: 100 mg via INTRAVENOUS

## 2019-03-15 MED ORDER — PHENOL 1.4 % MT LIQD: 1.0000 | OROMUCOSAL | Status: DC | PRN

## 2019-03-15 MED ORDER — ESMOLOL HCL 100 MG/10ML IV SOLN
INTRAVENOUS | Status: AC
  Filled 2019-03-15: qty 10

## 2019-03-15 MED ORDER — GUAIFENESIN-DM 100-10 MG/5ML PO SYRP: 15.0000 mL | ORAL_SOLUTION | ORAL | Status: DC | PRN

## 2019-03-15 MED ORDER — PROPOFOL 10 MG/ML IV BOLUS
INTRAVENOUS | Status: AC
  Filled 2019-03-15: qty 40

## 2019-03-15 MED ORDER — ALBUMIN HUMAN 5 % IV SOLN
12.5000 g | Freq: Once | INTRAVENOUS | Status: AC
  Administered 2019-03-15: 12.5 g via INTRAVENOUS

## 2019-03-15 MED ORDER — METOPROLOL TARTRATE 5 MG/5ML IV SOLN: 2.0000 mg | INTRAVENOUS | Status: DC | PRN

## 2019-03-15 MED ORDER — ESMOLOL HCL 100 MG/10ML IV SOLN
INTRAVENOUS | Status: DC | PRN
  Administered 2019-03-15: 10 mg via INTRAVENOUS

## 2019-03-15 MED ORDER — ALBUMIN HUMAN 5 % IV SOLN
INTRAVENOUS | Status: AC
  Filled 2019-03-15: qty 250

## 2019-03-15 MED ORDER — LABETALOL HCL 5 MG/ML IV SOLN: 10.0000 mg | INTRAVENOUS | Status: DC | PRN

## 2019-03-15 MED ORDER — ALBUMIN HUMAN 5 % IV SOLN
INTRAVENOUS | Status: DC | PRN
  Administered 2019-03-15: 09:00:00 via INTRAVENOUS

## 2019-03-15 MED ORDER — PIPERACILLIN-TAZOBACTAM 3.375 G IVPB
3.3750 g | Freq: Two times a day (BID) | INTRAVENOUS | Status: AC
  Administered 2019-03-15 – 2019-03-16 (×2): 3.375 g via INTRAVENOUS
  Filled 2019-03-15 (×2): qty 50

## 2019-03-15 MED ORDER — PHENYLEPHRINE 40 MCG/ML (10ML) SYRINGE FOR IV PUSH (FOR BLOOD PRESSURE SUPPORT)
PREFILLED_SYRINGE | INTRAVENOUS | Status: DC | PRN
  Administered 2019-03-15: 120 ug via INTRAVENOUS
  Administered 2019-03-15 (×3): 80 ug via INTRAVENOUS
  Administered 2019-03-15: 120 ug via INTRAVENOUS
  Administered 2019-03-15: 80 ug via INTRAVENOUS
  Administered 2019-03-15 (×2): 120 ug via INTRAVENOUS

## 2019-03-15 MED ORDER — SODIUM CHLORIDE 0.9 % IV SOLN
INTRAVENOUS | Status: DC | PRN
  Administered 2019-03-15: 08:00:00 25 ug/min via INTRAVENOUS

## 2019-03-15 MED ORDER — EPHEDRINE 5 MG/ML INJ
INTRAVENOUS | Status: AC
  Filled 2019-03-15: qty 10

## 2019-03-15 MED ORDER — HEPARIN SODIUM (PORCINE) 1000 UNIT/ML IJ SOLN
3000.0000 [IU] | Freq: Once | INTRAMUSCULAR | Status: AC
  Administered 2019-03-15: 2100 [IU] via INTRAVENOUS

## 2019-03-15 MED ORDER — HYDRALAZINE HCL 20 MG/ML IJ SOLN: 5.0000 mg | INTRAMUSCULAR | Status: DC | PRN

## 2019-03-15 MED ORDER — PIPERACILLIN-TAZOBACTAM 3.375 G IVPB 30 MIN
3.3750 g | INTRAVENOUS | Status: AC
  Administered 2019-03-15: 08:00:00 3.375 g via INTRAVENOUS
  Filled 2019-03-15: qty 50

## 2019-03-15 SURGICAL SUPPLY — 48 items
BANDAGE ELASTIC 6 VELCRO ST LF (GAUZE/BANDAGES/DRESSINGS) ×1 IMPLANT
BANDAGE ESMARK 6X9 LF (GAUZE/BANDAGES/DRESSINGS) IMPLANT
BLADE SAW RECIP 87.9 MT (BLADE) ×2 IMPLANT
BNDG CMPR 9X6 STRL LF SNTH (GAUZE/BANDAGES/DRESSINGS) ×1
BNDG COHESIVE 6X5 TAN STRL LF (GAUZE/BANDAGES/DRESSINGS) ×2 IMPLANT
BNDG ELASTIC 6X5.8 VLCR STR LF (GAUZE/BANDAGES/DRESSINGS) ×2 IMPLANT
BNDG ESMARK 6X9 LF (GAUZE/BANDAGES/DRESSINGS) ×2
BNDG GAUZE ELAST 4 BULKY (GAUZE/BANDAGES/DRESSINGS) ×3 IMPLANT
CANISTER SUCT 3000ML PPV (MISCELLANEOUS) ×2 IMPLANT
CLIP VESOCCLUDE MED 6/CT (CLIP) ×1 IMPLANT
COVER BACK TABLE 60X90IN (DRAPES) ×2 IMPLANT
COVER SURGICAL LIGHT HANDLE (MISCELLANEOUS) ×2 IMPLANT
COVER WAND RF STERILE (DRAPES) ×2 IMPLANT
CUFF TOURN SGL QUICK 18X4 (TOURNIQUET CUFF) IMPLANT
CUFF TOURN SGL QUICK 24 (TOURNIQUET CUFF)
CUFF TOURN SGL QUICK 34 (TOURNIQUET CUFF)
CUFF TOURN SGL QUICK 42 (TOURNIQUET CUFF) IMPLANT
CUFF TRNQT CYL 24X4X16.5-23 (TOURNIQUET CUFF) IMPLANT
CUFF TRNQT CYL 34X4.125X (TOURNIQUET CUFF) IMPLANT
DRAIN CHANNEL 19F RND (DRAIN) IMPLANT
DRAPE HALF SHEET 40X57 (DRAPES) ×4 IMPLANT
DRAPE ORTHO SPLIT 77X108 STRL (DRAPES) ×4
DRAPE SURG ORHT 6 SPLT 77X108 (DRAPES) ×2 IMPLANT
DRSG ADAPTIC 3X8 NADH LF (GAUZE/BANDAGES/DRESSINGS) ×2 IMPLANT
ELECT REM PT RETURN 9FT ADLT (ELECTROSURGICAL) ×2
ELECTRODE REM PT RTRN 9FT ADLT (ELECTROSURGICAL) ×1 IMPLANT
EVACUATOR SILICONE 100CC (DRAIN) IMPLANT
GAUZE SPONGE 4X4 12PLY STRL (GAUZE/BANDAGES/DRESSINGS) ×4 IMPLANT
GLOVE BIO SURGEON STRL SZ7.5 (GLOVE) ×2 IMPLANT
GOWN STRL REUS W/ TWL LRG LVL3 (GOWN DISPOSABLE) ×3 IMPLANT
GOWN STRL REUS W/TWL LRG LVL3 (GOWN DISPOSABLE) ×8
KIT BASIN OR (CUSTOM PROCEDURE TRAY) ×2 IMPLANT
KIT TURNOVER KIT B (KITS) ×2 IMPLANT
NS IRRIG 1000ML POUR BTL (IV SOLUTION) ×2 IMPLANT
PACK GENERAL/GYN (CUSTOM PROCEDURE TRAY) ×2 IMPLANT
PAD ARMBOARD 7.5X6 YLW CONV (MISCELLANEOUS) ×4 IMPLANT
STAPLER VISISTAT 35W (STAPLE) ×2 IMPLANT
STOCKINETTE IMPERVIOUS LG (DRAPES) ×2 IMPLANT
SUT ETHILON 3 0 PS 1 (SUTURE) IMPLANT
SUT SILK 2 0 (SUTURE) ×2
SUT SILK 2 0 SH CR/8 (SUTURE) ×2 IMPLANT
SUT SILK 2-0 18XBRD TIE 12 (SUTURE) ×1 IMPLANT
SUT VIC AB 2-0 SH 18 (SUTURE) ×6 IMPLANT
SUT VIC AB 3-0 SH 27 (SUTURE) ×4
SUT VIC AB 3-0 SH 27X BRD (SUTURE) ×1 IMPLANT
TOWEL GREEN STERILE (TOWEL DISPOSABLE) ×4 IMPLANT
UNDERPAD 30X30 (UNDERPADS AND DIAPERS) ×2 IMPLANT
WATER STERILE IRR 1000ML POUR (IV SOLUTION) ×2 IMPLANT

## 2019-03-15 NOTE — Telephone Encounter (Signed)
I called pt's dtr, [name] and she states she did not know that her ftr was to have surgery today, because she had not spoken with a doctor, but now she found out he has already had surgery. I told [name] I had called to see if there was anything I could help her with, but I would send this message to [name] and have him call.

## 2019-03-15 NOTE — Consult Note (Signed)
PV Navigator Consult acknowledged and chart reviewed. Working from remote location at this time.  73 y/o gentleman admitted for ischemic changes to his L foot/toes. He historically has had wounds to both feet that have received surgical intervention. He is s/p revascularization on 02/25/19, atherctomy 02/28/19 and amputation of L 3,4 toes 03/08/19. Underwent L below the knee amputation with Dr Oneida Alar today.  Medical history to include PAD, DM, HTN, ESRD w/HD on MWF, HLD, and CHF.  Consults for PT/OT, IP rehab, SW, CM and nutrition in place and active.   Will continue to follow this patient and assess needs as care transitions. Thank you,  Cletis Media RN BSN CWS [place] [phone]

## 2019-03-15 NOTE — Progress Notes (Addendum)
[facility] Progress Note   Subjective:   Patient seen in room. S/p BKA today, reporting some leg pain and tiredness. Denies SOB, dyspnea, dizziness, CP, palpitations, abdominal pain, N/V/D. Denies any recent issues with dialysis.   Objective Vitals:   03/15/19 1015 03/15/19 1030 03/15/19 1040 03/15/19 1101  BP: (!) 88/45 (!) 91/58 91/63 95/63   Pulse: 99 100 99 97  Resp: 20 16 18 16   Temp:   (!) 97.4 F (36.3 C) (!) 97.4 F (36.3 C)  TempSrc:    Oral  SpO2: 100% 100% 100% 98%  Weight:       Physical Exam General: Well developed male, alert and in NAD Heart: RRR, no murmurs rubs or gallops Lungs: CTA bilaterally without wheezing, rhonchi or rales Abdomen: Soft, non-tender, non-distended. +BS Extremities: No edema. L BKA stump wrapped. Dialysis Access:  Right IJ TDC, no erythema/drainage  Additional Objective Labs: Basic Metabolic Panel: Recent Labs  Lab 03/13/19 0543 03/14/19 0716 03/15/19 0618  NA 136 134* 136  K 3.5 3.8 4.1  CL 99 95* 97*  CO2 24 21* 23  GLUCOSE 78 83 71  BUN 34* 51* 31*  CREATININE 6.93* 8.67* 6.15*  CALCIUM 9.2 9.5 9.2   Liver Function Tests: Recent Labs  Lab 03/12/19 1216  AST 17  ALT 6  ALKPHOS 66  BILITOT 0.9  PROT 8.1  ALBUMIN 3.0*   CBC: Recent Labs  Lab 03/12/19 1216 03/13/19 0543 03/14/19 0716 03/15/19 0618  WBC 9.9 8.9 8.6 10.2  NEUTROABS 7.9*  --   --   --   HGB 11.7* 11.7* 11.5* 11.2*  HCT 39.2 37.7* 37.8* 37.0*  MCV 102.1* 98.2 98.4 100.3*  PLT 356 340 373 364   Blood Culture    Component Value Date/Time   SDES BLOOD RIGHT HAND 03/12/2019 1450   SPECREQUEST  03/12/2019 1450    BOTTLES DRAWN AEROBIC AND ANAEROBIC Blood Culture results may not be optimal due to an inadequate volume of blood received in culture bottles   CULT  03/12/2019 1450    NO GROWTH 3 DAYS Performed at [facility], [place] [address], [place], [place] [address]    REPTSTATUS PENDING 03/12/2019 1450   CBG: Recent Labs   Lab 03/14/19 0702 03/14/19 0727 03/14/19 2018 03/15/19 0912 03/15/19 1102  GLUCAP 65* 82 107* 89 97   Medications: . acetaminophen    . albumin human    . magnesium sulfate bolus IVPB    . piperacillin-tazobactam (ZOSYN)  IV    . vancomycin 750 mg (03/14/19 1751)   . aspirin EC  81 mg Oral Daily  . calcitRIOL  0.5 mcg Oral Q M,W,F-HD  . Chlorhexidine Gluconate Cloth  6 each Topical Q0600  . Chlorhexidine Gluconate Cloth  6 each Topical Q0600  . cinacalcet  30 mg Oral QODAY  . collagenase  1 application Topical Daily  . [START ON 03/16/2019] docusate sodium  100 mg Oral Daily  . feeding supplement (NEPRO CARB STEADY)  237 mL Oral BID BM  . feeding supplement (PRO-STAT SUGAR FREE 64)  30 mL Oral BID  . ferric citrate  420 mg Oral TID WC  . ferric citrate  420 mg Oral With snacks  . heparin  5,000 Units Subcutaneous Q8H  . midodrine  10 mg Oral Q M,W,F-HD  . multivitamin  1 tablet Oral QHS  . rosuvastatin  10 mg Oral q1800    Dialysis Orders: [facility]WF [facility]  4h  400/800  78kg  3K/2.5Ca bat  Hep 5000 +2000 midrun  R IJ TDC Calcitriol 0.5   no Mircera or Fe Recent labs: iPTH 248 Ca/P ok hgb 11.8 31% sat  Assessment/Plan: 1. PAD with critical limb ischemia - prior revascularization in July; seen by [facility] - no further vascular intervention available to improve circulation. Underwent BKA this morning.  2. Enterobacter wound culture 7/28 left foot - on Zosyn and Vanc now - afebrile, s/ BKA 3. ESRD- MWF, continue regular schedule. gradually losing wt, 1kg above EDW today. Will need EDW lowered due to BKA. K 4.1, will use 3K bath. No heparin post op.  4. Hypertension/volume- using midodrine for BP support. Systolic BP in 97'F on dialysis. No edema or SOB. 5. Anemia-hgb 11.2, no ESA/Fe indicated at this time.  6. Metabolic bone disease- Calcium 9.2. No phos reported.Continue calcitriol/sensipar/Auryxia 7. NutritionAlbumin 3.0. Continue supplement. Renal diet with fluid  restrictions recommended. 8. DM- Per primary 9. Hx NICM s/p ICD  Anice Paganini, PA-C 03/15/2019, 12:39 PM  [facility] Pager: [phone]  Pt seen, examined and agree w A/P as above.  [name]  MD 03/15/2019, 1:58 PM

## 2019-03-15 NOTE — Op Note (Signed)
Procedure: left below-knee amputation  Preoperative diagnosis: Gangrene left foot  Postoperative diagnosis: Same  Anesthesia Gen.  Assistant: Linus Orn SA  Upper findings: #1 calcified vessels with reasonably well perfused muscle tissue  Operative details: After obtaining informed consent, the patient was taken to the operating room. The patient was placed in supine position on the operating table. After induction of general anesthesia and endotracheal intubation, the patient's entire left lower extremity was prepped and draped all the way down to the level of the ankle. Next a transverse incision was made approximately 4 fingerbreadths below the tibial tuberosity on the left leg.  The  incision was carried posteriorly to the midportion of the leg and then extended longitudinally to create a posterior flap. The subcutaneous tissues and fascia was taken down with cautery. Periosteum was raised on the tibia approximately 5 cm above the skin edge.  The periosteum was also raised on the fibula several centimeters above this. The tibia was then divided with a saw. The fibula was divided with a bone cutter. The leg was then elevated in the operative field and the amputation was completed posterior to the bones with an amputation knife. Hemostasis was then obtained with cautery and several suture ligatures. The tibial and sural nerves were pulled down into the field transected and allowed to retract up into the leg.  After hemostasis was obtained, the wound was thoroughly irrigated with  normal saline solution. The fascial edges were then reapproximated with interrupted 2-0 Vicryl sutures. Subcutaneous tissues were reapproximated using running 3-0 Vicryl suture. The skin was closed with staples. The patient tolerated the procedure well and there were no complications. Instrument sponge and  needle counts were correct at the end of the case. The patient was taken to the recovery room in stable  condition.  Ruta Hinds, MD [facility] Office: [phone] Pager: [phone]

## 2019-03-15 NOTE — Telephone Encounter (Signed)
Patients daughter called requesting a call back from [name].

## 2019-03-15 NOTE — Anesthesia Postprocedure Evaluation (Signed)
Anesthesia Post Note  Patient: Nelva Nay  Procedure(s) Performed: AMPUTATION BELOW KNEE (Left Leg Lower)     Patient location during evaluation: PACU Anesthesia Type: General Level of consciousness: awake and alert Pain management: pain level controlled Vital Signs Assessment: post-procedure vital signs reviewed and stable Respiratory status: spontaneous breathing, nonlabored ventilation, respiratory function stable and patient connected to nasal cannula oxygen Cardiovascular status: blood pressure returned to baseline and stable Postop Assessment: no apparent nausea or vomiting Anesthetic complications: no    Last Vitals:  Vitals:   03/15/19 1703 03/15/19 2113  BP: 108/78 110/68  Pulse: 87 89  Resp:  18  Temp: 36.5 C 36.6 C  SpO2: 97% 100%    Last Pain:  Vitals:   03/15/19 2113  TempSrc: Oral  PainSc:                  Effie Berkshire

## 2019-03-15 NOTE — Progress Notes (Addendum)
PROGRESS NOTE    Timothy Faulkner  KDT:[id number] DOB: 01-10-1946 DOA: 03/12/2019 PCP: [name], MD   Brief Narrative:  Patient is a 73 year old male with history of diabetes mellitus, peripheral vascular disease status post revascularization procedure on 7/17and atherectomy on 7/20 with subsequent 3/4 toe amputation on the L foot 7/28; HTN; HLD; ESRD on MWF HD; AICD placement; and CHF presenting with R 2nd toe wound infection.  He presented to the emergency department after his podiatrist [name] recommended for evaluation of second toe which had turned dark.  Nephrology following for dialysis.  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  Assessment & Plan:   Principal Problem:   Critical lower limb ischemia Active Problems:   HYPERCHOLESTEROLEMIA, MIXED   Essential hypertension   ESRD (end stage renal disease) on dialysis (HCC)   Chronic systolic heart failure (HCC)   PAD (peripheral artery disease) (HCC)   Ulcer of right foot with fat layer exposed ([place])   Gangrene of toe of left foot (HCC)   Critical lower limb ischemia/left second toe gangrene: History of severe peripheral artery disease.  Past history as above.  Recent admission on 7/14-23 with critical limb ischemia and third/fourth toe amputation.  Status post revascularization procedure.  On aspirin and Plavix at home.  He also has wound on the lateral side of the right foot.  It is healing well with intact graft.   He Presented with black discoloration of his left second toe.  Second toe is gangrenous and there is poor wound healing of the third and fourth amputation.  Vascular surgery and podiatry following.  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   Will consider discontinuing antibiotics soon.  Will check with vascular surgery.  ESRD on dialysis:   Nephrology following.  Does not appear to be volume overloaded.Undergoinfg dialysis  Chronic  systolic CHF: Currently compensated.  Chronic hypotension: On Midodrin.  Currently blood pressure stable.  Diabetes mellitus type II: Continue current regimen.  Monitor CBGs.    Nutrition Problem: Increased nutrient needs Etiology: wound healing      DVT prophylaxis:Heparin [place] Code Status: Full Family Communication: Discussed with the patient Disposition Plan: Waiting for PT/OT evaluation.  Consultants: Vascular surgery, podiatry  Procedures: Left BKA  Antimicrobials:  Anti-infectives (From admission, onward)   Start     Dose/Rate Route Frequency Ordered Stop   03/15/19 0730  piperacillin-tazobactam (ZOSYN) IVPB 3.375 g     3.375 g 100 mL/hr over 30 Minutes Intravenous To Surgery 03/15/19 0728 03/15/19 0758   03/14/19 1200  vancomycin (VANCOCIN) IVPB 750 mg/150 ml premix     750 mg 150 mL/hr over 60 Minutes Intravenous Every M-W-F (Hemodialysis) 03/12/19 1819     03/13/19 1000  piperacillin-tazobactam (ZOSYN) IVPB 3.375 g     3.375 g 12.5 mL/hr over 240 Minutes Intravenous Every 12 hours 03/13/19 0813     03/12/19 1800  vancomycin (VANCOCIN) 1,500 mg in sodium chloride 0.9 % 500 mL IVPB     1,500 mg 250 mL/hr over 120 Minutes Intravenous  Once 03/12/19 1640 03/13/19 0113      Subjective: Patient seen and examined the bedside this morning after the surgery.  He just came back from left-sided BKA.  Complains of severe pain on the amputation site.  Denies any other complaints.   Objective: Vitals:   03/15/19 1015 03/15/19 1030 03/15/19 1040 03/15/19 1101  BP: (!) 88/45 (!) 91/58 91/63 95/63   Pulse: 99 100  99 97  Resp: 20 16 18 16   Temp:   (!) 97.4 F (36.3 C) (!) 97.4 F (36.3 C)  TempSrc:    Oral  SpO2: 100% 100% 100% 98%  Weight:        Intake/Output Summary (Last 24 hours) at 03/15/2019 1106 Last data filed at 03/15/2019 1100 Gross per 24 hour  Intake 1440 ml  Output 1892 ml  Net -452 ml   Filed Weights   03/14/19 1137 03/14/19 1510 03/14/19 2019   Weight: 80.3 kg 79 kg 79 kg    Examination:  General exam: Appears calm and comfortable ,Not in distress,obese HEENT:PERRL,Oral mucosa moist, Ear/Nose normal on gross exam Respiratory system: Bilateral equal air entry, normal vesicular breath sounds, no wheezes or crackles  Cardiovascular system: S1 & S2 heard, RRR. No JVD, murmurs, rubs, gallops or clicks. Gastrointestinal system: Abdomen is nondistended, soft and nontender. No organomegaly or masses felt. Normal bowel sounds heard. Central nervous system: Alert and oriented. No focal neurological deficits. Extremities: Left lower extremity covered with  Dressing. Healing wound on the right lateral foot.  Data Reviewed: I have personally reviewed following labs and imaging studies  CBC: Recent Labs  Lab 03/12/19 1216 03/13/19 0543 03/14/19 0716 03/15/19 0618  WBC 9.9 8.9 8.6 10.2  NEUTROABS 7.9*  --   --   --   HGB 11.7* 11.7* 11.5* 11.2*  HCT 39.2 37.7* 37.8* 37.0*  MCV 102.1* 98.2 98.4 100.3*  PLT 356 340 373 160   Basic Metabolic Panel: Recent Labs  Lab 03/12/19 1216 03/13/19 0543 03/14/19 0716 03/15/19 0618  NA 133* 136 134* 136  K 3.9 3.5 3.8 4.1  CL 94* 99 95* 97*  CO2 22 24 21* 23  GLUCOSE 87 78 83 71  BUN 25* 34* 51* 31*  CREATININE 5.97* 6.93* 8.67* 6.15*  CALCIUM 9.4 9.2 9.5 9.2   GFR: Estimated Creatinine Clearance: 10.5 mL/min (A) (by C-G formula based on SCr of 6.15 mg/dL (H)). Liver Function Tests: Recent Labs  Lab 03/12/19 1216  AST 17  ALT 6  ALKPHOS 66  BILITOT 0.9  PROT 8.1  ALBUMIN 3.0*   No results for input(s): LIPASE, AMYLASE in the last 168 hours. No results for input(s): AMMONIA in the last 168 hours. Coagulation Profile: Recent Labs  Lab 03/12/19 1216 03/15/19 0618  INR 1.2 1.2   Cardiac Enzymes: No results for input(s): CKTOTAL, CKMB, CKMBINDEX, TROPONINI in the last 168 hours. BNP (last 3 results) No results for input(s): PROBNP in the last 8760 hours. HbA1C: Recent  Labs    03/12/19 1745  HGBA1C 5.2   CBG: Recent Labs  Lab 03/14/19 0702 03/14/19 0727 03/14/19 2018 03/15/19 0912 03/15/19 1102  GLUCAP 65* 82 107* 89 97   Lipid Profile: No results for input(s): CHOL, HDL, LDLCALC, TRIG, CHOLHDL, LDLDIRECT in the last 72 hours. Thyroid Function Tests: No results for input(s): TSH, T4TOTAL, FREET4, T3FREE, THYROIDAB in the last 72 hours. Anemia Panel: No results for input(s): VITAMINB12, FOLATE, FERRITIN, TIBC, IRON, RETICCTPCT in the last 72 hours. Sepsis Labs: Recent Labs  Lab 03/12/19 1500  LATICACIDVEN 1.6    Recent Results (from the past 240 hour(s))  SARS Coronavirus 2 (Performed in [place] hospital lab)     Status: None   Collection Time: 03/07/19 10:55 AM   Specimen: Nasal Swab  Result Value Ref Range Status   SARS Coronavirus 2 NEGATIVE NEGATIVE Final    Comment: (NOTE) SARS-CoV-2 target nucleic acids are NOT DETECTED. The SARS-CoV-2  RNA is generally detectable in upper and lower respiratory specimens during the acute phase of infection. Negative results do not preclude SARS-CoV-2 infection, do not rule out co-infections with other pathogens, and should not be used as the sole basis for treatment or other patient management decisions. Negative results must be combined with clinical observations, patient history, and epidemiological information. The expected result is Negative. Fact Sheet for Patients: SugarRoll.be Fact Sheet for Healthcare Providers: https://www.woods-mathews.com/ This test is not yet approved or cleared by the [place] FDA and  has been authorized for detection and/or diagnosis of SARS-CoV-2 by FDA under an Emergency Use Authorization (EUA). This EUA will remain  in effect (meaning this test can be used) for the duration of the COVID-19 declaration under Section 56 4(b)(1) of the Act, 21 U.S.C. section 360bbb-3(b)(1), unless the authorization is  terminated or revoked sooner. Performed at [facility], [place] [address], [place], [place] [address]   Aerobic/Anaerobic Culture (surgical/deep wound)     Status: None   Collection Time: 03/08/19  8:15 AM   Specimen: Soft Tissue, Other  Result Value Ref Range Status   Specimen Description FOOT LEFT  Final   Special Requests DEEP SOFT TISSUE CULTURE  Final   Gram Stain   Final    RARE WBC PRESENT, PREDOMINANTLY PMN FEW GRAM NEGATIVE RODS    Culture   Final    MODERATE ENTEROBACTER CLOACAE NO ANAEROBES ISOLATED Performed at [facility], [place] [address] [place], [place] [address]    Report Status 03/13/2019 FINAL  Final   Organism ID, Bacteria ENTEROBACTER CLOACAE  Final      Susceptibility   Enterobacter cloacae - MIC*    CEFAZOLIN >=64 RESISTANT Resistant     CEFEPIME <=1 SENSITIVE Sensitive     CEFTAZIDIME <=1 SENSITIVE Sensitive     CEFTRIAXONE <=1 SENSITIVE Sensitive     CIPROFLOXACIN <=0.25 SENSITIVE Sensitive     GENTAMICIN <=1 SENSITIVE Sensitive     IMIPENEM <=0.25 SENSITIVE Sensitive     TRIMETH/SULFA <=20 SENSITIVE Sensitive     PIP/TAZO <=4 SENSITIVE Sensitive     * MODERATE ENTEROBACTER CLOACAE  Blood culture (routine x 2)     Status: None (Preliminary result)   Collection Time: 03/12/19  1:00 PM   Specimen: BLOOD RIGHT WRIST  Result Value Ref Range Status   Specimen Description BLOOD RIGHT WRIST  Final   Special Requests   Final    BOTTLES DRAWN AEROBIC AND ANAEROBIC Blood Culture results may not be optimal due to an inadequate volume of blood received in culture bottles   Culture   Final    NO GROWTH 3 DAYS Performed at [facility], [place] [address] [place], [place] [address]    Report Status PENDING  Incomplete  Blood culture (routine x 2)     Status: None (Preliminary result)   Collection Time: 03/12/19  2:50 PM   Specimen: BLOOD RIGHT HAND  Result Value Ref Range Status   Specimen Description BLOOD RIGHT HAND  Final   Special  Requests   Final    BOTTLES DRAWN AEROBIC AND ANAEROBIC Blood Culture results may not be optimal due to an inadequate volume of blood received in culture bottles   Culture   Final    NO GROWTH 3 DAYS Performed at [facility], [place] [address], [place], [place] [address]    Report Status PENDING  Incomplete  SARS CORONAVIRUS 2 Nasal Swab Aptima Multi Swab  Status: None   Collection Time: 03/12/19  5:18 PM   Specimen: Aptima Multi Swab; Nasal Swab  Result Value Ref Range Status   SARS Coronavirus 2 NEGATIVE NEGATIVE Final    Comment: (NOTE) SARS-CoV-2 target nucleic acids are NOT DETECTED. The SARS-CoV-2 RNA is generally detectable in upper and lower respiratory specimens during the acute phase of infection. Negative results do not preclude SARS-CoV-2 infection, do not rule out co-infections with other pathogens, and should not be used as the sole basis for treatment or other patient management decisions. Negative results must be combined with clinical observations, patient history, and epidemiological information. The expected result is Negative. Fact Sheet for Patients: SugarRoll.be Fact Sheet for Healthcare Providers: https://www.woods-mathews.com/ This test is not yet approved or cleared by the [place] FDA and  has been authorized for detection and/or diagnosis of SARS-CoV-2 by FDA under an Emergency Use Authorization (EUA). This EUA will remain  in effect (meaning this test can be used) for the duration of the COVID-19 declaration under Section 56 4(b)(1) of the Act, 21 U.S.C. section 360bbb-3(b)(1), unless the authorization is terminated or revoked sooner. Performed at [facility], [place] [address], [place], [place] [address]          Radiology Studies: No results found.      Scheduled Meds: . aspirin EC  81 mg Oral Daily  . calcitRIOL  0.5 mcg Oral Q M,W,F-HD  . Chlorhexidine Gluconate Cloth  6 each  Topical Q0600  . Chlorhexidine Gluconate Cloth  6 each Topical Q0600  . cinacalcet  30 mg Oral QODAY  . collagenase  1 application Topical Daily  . feeding supplement (NEPRO CARB STEADY)  237 mL Oral BID BM  . feeding supplement (PRO-STAT SUGAR FREE 64)  30 mL Oral BID  . ferric citrate  420 mg Oral TID WC  . ferric citrate  420 mg Oral With snacks  . heparin  5,000 Units Subcutaneous Q8H  . midodrine  10 mg Oral Q M,W,F-HD  . multivitamin  1 tablet Oral QHS  . rosuvastatin  10 mg Oral q1800   Continuous Infusions: . acetaminophen    . albumin human    . piperacillin-tazobactam (ZOSYN)  IV 3.375 g (03/15/19 0100)  . vancomycin 750 mg (03/14/19 1751)     LOS: 3 days    Time spent: 35 mins.More than 50% of that time was spent in counseling and/or coordination of care.      Shelly Coss, MD [facility] Pager [phone]  If 7PM-7AM, please contact night-coverage [email or web address] Password [email or web address] 03/15/2019, 11:06 AM

## 2019-03-15 NOTE — Telephone Encounter (Addendum)
Called patient's daughter [name] back.  Patient stated that on Sunday her dad told her I would call her to discuss what was going on with his foot.  Discussed with her that as of our visit on Sunday patient still has not made a decision and wanted to talk over with family and friends.  I discussed that I would not under impression he wanted me to call her to discuss, especially given that he had not made a decision yet.  At that time I have also encouraged him to have the nurse call me should he or his family have any questions or concerns.  I asked Mr. Eisenhour last night if he had discussed the surgery with his family and he said he had.  I asked him what they thought about his decision to have surgery to remove his leg and he said that they understood that this is what it had to be and they were accepting of it.  I also asked him whether or not his daughter had been by as Covid visitor restrictions were relaxed.  He said that she had not but I encouraged him to have her do so.  He did not ask me to call her and based upon the fact that he told me he discussed surgery with his family I was not under any impression that she was expecting a call from me.   I apologized to her for any miscommunication.  I discussed that as always I am available should she have any questions or concerns. She was very accepting of my apology and only upset that she was blindsided by not knowing he was having surgery and she expected a call from a doctor before surgery rather than just hearing after the fact.  She thanked me for calling her back to discuss.

## 2019-03-15 NOTE — Progress Notes (Signed)
Patient has returned from the OR. Patient is easily aroused and oriented x 4. L BKA is wrapped with ace wrap and elevated with pillow. Denies discomfort at this time. Dorthey Sawyer, RN

## 2019-03-15 NOTE — Anesthesia Preprocedure Evaluation (Addendum)
Anesthesia Evaluation  Patient identified by MRN, date of birth, ID band Patient awake    Reviewed: Allergy & Precautions, NPO status , Patient's Chart, lab work & pertinent test results  Airway Mallampati: II  TM Distance: >3 FB Neck ROM: Full    Dental  (+) Poor Dentition, Missing, Dental Advisory Given   Pulmonary sleep apnea , former smoker,     + decreased breath sounds      Cardiovascular hypertension, + Peripheral Vascular Disease and +CHF  + pacemaker + Cardiac Defibrillator  Rhythm:Regular Rate:Normal     Neuro/Psych negative neurological ROS  negative psych ROS   GI/Hepatic Neg liver ROS, GERD  ,  Endo/Other  diabetes, Type 2  Renal/GU ESRF and DialysisRenal disease     Musculoskeletal  (+) Arthritis ,   Abdominal Normal abdominal exam  (+)   Peds  Hematology negative hematology ROS (+)   Anesthesia Other Findings   Reproductive/Obstetrics                            Anesthesia Physical Anesthesia Plan  ASA: III  Anesthesia Plan: General   Post-op Pain Management:    Induction: Intravenous  PONV Risk Score and Plan: 3 and Ondansetron, Dexamethasone and Midazolam  Airway Management Planned: LMA  Additional Equipment: None  Intra-op Plan:   Post-operative Plan: Extubation in OR  Informed Consent: I have reviewed the patients History and Physical, chart, labs and discussed the procedure including the risks, benefits and alternatives for the proposed anesthesia with the patient or authorized representative who has indicated his/her understanding and acceptance.     Dental advisory given  Plan Discussed with: CRNA  Anesthesia Plan Comments:         Anesthesia Quick Evaluation

## 2019-03-15 NOTE — Interval H&P Note (Signed)
History and Physical Interval Note:  03/15/2019 7:24 AM  Timothy Faulkner  has presented today for surgery, with the diagnosis of nonviable tissue left foot.  The various methods of treatment have been discussed with the patient and family. After consideration of risks, benefits and other options for treatment, the patient has consented to  Procedure(s): AMPUTATION BELOW KNEE (Left) as a surgical intervention.  The patient's history has been reviewed, patient examined, no change in status, stable for surgery.  I have reviewed the patient's chart and labs.  Questions were answered to the patient's satisfaction.     Ruta Hinds

## 2019-03-15 NOTE — Anesthesia Procedure Notes (Signed)
Procedure Name: LMA Insertion Date/Time: 03/15/2019 7:51 AM Performed by: Colin Benton, CRNA Pre-anesthesia Checklist: Patient identified, Emergency Drugs available, Suction available and Patient being monitored Patient Re-evaluated:Patient Re-evaluated prior to induction Oxygen Delivery Method: Circle system utilized Preoxygenation: Pre-oxygenation with 100% oxygen Induction Type: IV induction Ventilation: Mask ventilation without difficulty LMA: LMA inserted LMA Size: 4.0 Number of attempts: 1 Placement Confirmation: positive ETCO2 and breath sounds checked- equal and bilateral Tube secured with: Tape Dental Injury: Teeth and Oropharynx as per pre-operative assessment

## 2019-03-15 NOTE — Transfer of Care (Signed)
Immediate Anesthesia Transfer of Care Note  Patient: Timothy Faulkner  Procedure(s) Performed: AMPUTATION BELOW KNEE (Left Leg Lower)  Patient Location: PACU  Anesthesia Type:General  Level of Consciousness: awake, alert , oriented and patient cooperative  Airway & Oxygen Therapy: Patient Spontanous Breathing and Patient connected to face mask oxygen  Post-op Assessment: Report given to RN and Post -op Vital signs reviewed and stable  Post vital signs: Reviewed and stable  Last Vitals:  Vitals Value Taken Time  BP 155/120 03/15/19 0913  Temp    Pulse 90 03/15/19 0925  Resp 27 03/15/19 0925  SpO2 100 % 03/15/19 0925  Vitals shown include unvalidated device data.  Last Pain:  Vitals:   03/15/19 0915  TempSrc:   PainSc: (P) 10-Worst pain ever      Patients Stated Pain Goal: (P) 3 (14/43/15 4008)  Complications: No apparent anesthesia complications

## 2019-03-16 ENCOUNTER — Encounter (HOSPITAL_COMMUNITY): Payer: Self-pay | Admitting: Vascular Surgery

## 2019-03-16 LAB — BASIC METABOLIC PANEL
Anion gap: 19 — ABNORMAL HIGH (ref 5–15)
BUN: 39 mg/dL — ABNORMAL HIGH (ref 8–23)
CO2: 21 mmol/L — ABNORMAL LOW (ref 22–32)
Calcium: 9.3 mg/dL (ref 8.9–10.3)
Chloride: 94 mmol/L — ABNORMAL LOW (ref 98–111)
Creatinine, Ser: 7.41 mg/dL — ABNORMAL HIGH (ref 0.61–1.24)
GFR calc Af Amer: 8 mL/min — ABNORMAL LOW (ref >60)
GFR calc non Af Amer: 7 mL/min — ABNORMAL LOW (ref >60)
Glucose, Bld: 108 mg/dL — ABNORMAL HIGH (ref 70–99)
Potassium: 4.7 mmol/L (ref 3.5–5.1)
Sodium: 134 mmol/L — ABNORMAL LOW (ref 135–145)

## 2019-03-16 LAB — GLUCOSE, CAPILLARY
Glucose-Capillary: 98 mg/dL (ref 70–99)
Glucose-Capillary: 135 mg/dL — ABNORMAL HIGH (ref 70–99)
Glucose-Capillary: 119 mg/dL — ABNORMAL HIGH (ref 70–99)
Glucose-Capillary: 127 mg/dL — ABNORMAL HIGH (ref 70–99)

## 2019-03-16 LAB — CBC WITH DIFFERENTIAL/PLATELET
Abs Immature Granulocytes: 0.1 10*3/uL — ABNORMAL HIGH (ref 0.00–0.07)
Basophils Absolute: 0.1 10*3/uL (ref 0.0–0.1)
Basophils Relative: 1 %
Eosinophils Absolute: 0 10*3/uL (ref 0.0–0.5)
Eosinophils Relative: 0 %
HCT: 36.4 % — ABNORMAL LOW (ref 39.0–52.0)
Hemoglobin: 10.8 g/dL — ABNORMAL LOW (ref 13.0–17.0)
Immature Granulocytes: 1 %
Lymphocytes Relative: 10 %
Lymphs Abs: 1 10*3/uL (ref 0.7–4.0)
MCH: 29.7 pg (ref 26.0–34.0)
MCHC: 29.7 g/dL — ABNORMAL LOW (ref 30.0–36.0)
MCV: 100 fL (ref 80.0–100.0)
Monocytes Absolute: 0.9 10*3/uL (ref 0.1–1.0)
Monocytes Relative: 9 %
Neutro Abs: 7.7 10*3/uL (ref 1.7–7.7)
Neutrophils Relative %: 79 %
Platelets: 346 10*3/uL (ref 150–400)
RBC: 3.64 MIL/uL — ABNORMAL LOW (ref 4.22–5.81)
RDW: 17.1 % — ABNORMAL HIGH (ref 11.5–15.5)
WBC: 9.8 10*3/uL (ref 4.0–10.5)
nRBC: 0 % (ref 0.0–0.2)

## 2019-03-16 MED ORDER — HEPARIN SODIUM (PORCINE) 1000 UNIT/ML IJ SOLN
INTRAMUSCULAR | Status: AC
  Filled 2019-03-16: qty 4

## 2019-03-16 MED ORDER — MIDODRINE HCL 5 MG PO TABS
ORAL_TABLET | ORAL | Status: AC
  Filled 2019-03-16: qty 2

## 2019-03-16 MED ORDER — CALCITRIOL 0.5 MCG PO CAPS
ORAL_CAPSULE | ORAL | Status: AC
  Filled 2019-03-16: qty 1

## 2019-03-16 NOTE — Progress Notes (Signed)
[facility]  Subjective  - pain left leg   Objective 114/61 90 97.6 F (36.4 C) (Oral) 18 98%  Intake/Output Summary (Last 24 hours) at 03/16/2019 0817 Last data filed at 03/16/2019 0446 Gross per 24 hour  Intake 1399.94 ml  Output 300 ml  Net 1099.94 ml   Dressing dry left BKA Early flexion contracture due to pain  Assessment/Planning: POD #1 left BKA PT OT today Discussed with pt importance of straightening left leg  Ruta Hinds 03/16/2019 8:17 AM --  Laboratory Lab Results: Recent Labs    03/15/19 0618 03/16/19 0427  WBC 10.2 9.8  HGB 11.2* 10.8*  HCT 37.0* 36.4*  PLT 364 346   BMET Recent Labs    03/15/19 0618 03/16/19 0427  NA 136 134*  K 4.1 4.7  CL 97* 94*  CO2 23 21*  GLUCOSE 71 108*  BUN 31* 39*  CREATININE 6.15* 7.41*  CALCIUM 9.2 9.3    COAG Lab Results  Component Value Date   INR 1.2 03/15/2019   INR 1.2 03/12/2019   INR 1.24 03/12/2013   No results found for: PTT

## 2019-03-16 NOTE — Progress Notes (Signed)
PROGRESS NOTE    Timothy Faulkner  QIH:[id number] DOB: 1945/08/26 DOA: 03/12/2019 PCP: [name], MD   Brief Narrative:  Patient is a 73 year old male with history of diabetes mellitus, peripheral vascular disease status post revascularization procedure on 7/17and atherectomy on 7/20 with subsequent 3/4 toe amputation on the L foot 7/28; HTN; HLD; ESRD on MWF HD; AICD placement; and CHF presenting with R 2nd toe wound infection.  He presented to the emergency department after his podiatrist [name] recommended for evaluation of second toe which had turned dark.  Nephrology following for dialysis.  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  Assessment & Plan:   Principal Problem:   Critical lower limb ischemia Active Problems:   HYPERCHOLESTEROLEMIA, MIXED   Essential hypertension   ESRD (end stage renal disease) on dialysis (HCC)   Chronic systolic heart failure (HCC)   PAD (peripheral artery disease) (HCC)   Ulcer of right foot with fat layer exposed ([place])   Gangrene of toe of left foot (HCC)   Critical lower limb ischemia/left second toe gangrene: History of severe peripheral artery disease.  Past history as above.  Recent admission on 7/14-23 with critical limb ischemia and third/fourth toe amputation.  Status post revascularization procedure.  On aspirin and Plavix at home.  He also has wound on the lateral side of the right foot.  It is healing well with intact graft.   He Presented with black discoloration of his left second toe.  Second toe was gangrenous and there was poor wound healing of the third and fourth amputation.  Vascular surgery and podiatry were following.  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   Abx D/Ced  Debility/deconditioning: Patient evaluated by PT/OT and recommended CIR.  CIR consulted  ESRD on dialysis:   Nephrology following.  Does not appear to be volume overloaded.Undergoing  dialysis  Chronic systolic CHF: Currently compensated.  Chronic hypotension: On Midodrin.  Currently blood pressure stable.  Diabetes mellitus type II: Continue current regimen.  Monitor CBGs.    Nutrition Problem: Increased nutrient needs Etiology: wound healing      DVT prophylaxis:Heparin [place] Code Status: Full Family Communication: Discussed with daughter on phone on 03/16/19 Disposition Plan: CiR as soon as bed is available Consultants: Vascular surgery, podiatry  Procedures: Left BKA  Antimicrobials:  Anti-infectives (From admission, onward)   Start     Dose/Rate Route Frequency Ordered Stop   03/15/19 1800  piperacillin-tazobactam (ZOSYN) IVPB 3.375 g     3.375 g 12.5 mL/hr over 240 Minutes Intravenous Every 12 hours 03/15/19 1107 03/16/19 0929   03/15/19 0730  piperacillin-tazobactam (ZOSYN) IVPB 3.375 g     3.375 g 100 mL/hr over 30 Minutes Intravenous To Surgery 03/15/19 0728 03/15/19 0758   03/14/19 1200  vancomycin (VANCOCIN) IVPB 750 mg/150 ml premix  Status:  Discontinued     750 mg 150 mL/hr over 60 Minutes Intravenous Every M-W-F (Hemodialysis) 03/12/19 1819 03/15/19 1415   03/13/19 1000  piperacillin-tazobactam (ZOSYN) IVPB 3.375 g  Status:  Discontinued     3.375 g 12.5 mL/hr over 240 Minutes Intravenous Every 12 hours 03/13/19 0813 03/15/19 1107   03/12/19 1800  vancomycin (VANCOCIN) 1,500 mg in sodium chloride 0.9 % 500 mL IVPB     1,500 mg 250 mL/hr over 120 Minutes Intravenous  Once 03/12/19 1640 03/13/19 0113      Subjective: Patient seen and examined the bedside this morning.  Comfortable.  Hemodynamically stable.  Pain is well controlled today.  Waiting for physical therapy evaluation.  Denies any complaints or questions.  Objective: Vitals:   03/16/19 1220 03/16/19 1245 03/16/19 1315 03/16/19 1345  BP: 101/62 (!) 110/55 (!) 112/50 (!) 98/57  Pulse: 99 100 99 87  Resp: 18     Temp:      TempSrc:      SpO2:      Weight:         Intake/Output Summary (Last 24 hours) at 03/16/2019 1434 Last data filed at 03/16/2019 1215 Gross per 24 hour  Intake 569.94 ml  Output 0 ml  Net 569.94 ml   Filed Weights   03/14/19 1137 03/14/19 1510 03/14/19 2019  Weight: 80.3 kg 79 kg 79 kg    Examination:  General exam: Appears calm and comfortable ,Not in distress,obese HEENT:PERRL,Oral mucosa moist, Ear/Nose normal on gross exam Respiratory system: Bilateral equal air entry, normal vesicular breath sounds, no wheezes or crackles  Cardiovascular system: S1 & S2 heard, RRR. No JVD, murmurs, rubs, gallops or clicks. Gastrointestinal system: Abdomen is nondistended, soft and nontender. No organomegaly or masses felt. Normal bowel sounds heard. Central nervous system: Alert and oriented. No focal neurological deficits. Extremities: Left lower extremity covered with  Dressing. Healing wound on the right lateral foot.  Data Reviewed: I have personally reviewed following labs and imaging studies  CBC: Recent Labs  Lab 03/12/19 1216 03/13/19 0543 03/14/19 0716 03/15/19 0618 03/16/19 0427  WBC 9.9 8.9 8.6 10.2 9.8  NEUTROABS 7.9*  --   --   --  7.7  HGB 11.7* 11.7* 11.5* 11.2* 10.8*  HCT 39.2 37.7* 37.8* 37.0* 36.4*  MCV 102.1* 98.2 98.4 100.3* 100.0  PLT 356 340 373 364 211   Basic Metabolic Panel: Recent Labs  Lab 03/12/19 1216 03/13/19 0543 03/14/19 0716 03/15/19 0618 03/16/19 0427  NA 133* 136 134* 136 134*  K 3.9 3.5 3.8 4.1 4.7  CL 94* 99 95* 97* 94*  CO2 22 24 21* 23 21*  GLUCOSE 87 78 83 71 108*  BUN 25* 34* 51* 31* 39*  CREATININE 5.97* 6.93* 8.67* 6.15* 7.41*  CALCIUM 9.4 9.2 9.5 9.2 9.3   GFR: Estimated Creatinine Clearance: 8.7 mL/min (A) (by C-G formula based on SCr of 7.41 mg/dL (H)). Liver Function Tests: Recent Labs  Lab 03/12/19 1216  AST 17  ALT 6  ALKPHOS 66  BILITOT 0.9  PROT 8.1  ALBUMIN 3.0*   No results for input(s): LIPASE, AMYLASE in the last 168 hours. No results for input(s):  AMMONIA in the last 168 hours. Coagulation Profile: Recent Labs  Lab 03/12/19 1216 03/15/19 0618  INR 1.2 1.2   Cardiac Enzymes: No results for input(s): CKTOTAL, CKMB, CKMBINDEX, TROPONINI in the last 168 hours. BNP (last 3 results) No results for input(s): PROBNP in the last 8760 hours. HbA1C: No results for input(s): HGBA1C in the last 72 hours. CBG: Recent Labs  Lab 03/14/19 2018 03/15/19 0912 03/15/19 1102 03/16/19 0700 03/16/19 1124  GLUCAP 107* 89 97 98 135*   Lipid Profile: No results for input(s): CHOL, HDL, LDLCALC, TRIG, CHOLHDL, LDLDIRECT in the last 72 hours. Thyroid Function Tests: No results for input(s): TSH, T4TOTAL, FREET4, T3FREE, THYROIDAB in the last 72 hours. Anemia Panel: No results for input(s): VITAMINB12, FOLATE, FERRITIN, TIBC, IRON, RETICCTPCT in the last 72 hours. Sepsis Labs: Recent Labs  Lab 03/12/19 1500  LATICACIDVEN 1.6    Recent Results (from the past 240 hour(s))  SARS Coronavirus 2 (Performed  in [facility])     Status: None   Collection Time: 03/07/19 10:55 AM   Specimen: Nasal Swab  Result Value Ref Range Status   SARS Coronavirus 2 NEGATIVE NEGATIVE Final    Comment: (NOTE) SARS-CoV-2 target nucleic acids are NOT DETECTED. The SARS-CoV-2 RNA is generally detectable in upper and lower respiratory specimens during the acute phase of infection. Negative results do not preclude SARS-CoV-2 infection, do not rule out co-infections with other pathogens, and should not be used as the sole basis for treatment or other patient management decisions. Negative results must be combined with clinical observations, patient history, and epidemiological information. The expected result is Negative. Fact Sheet for Patients: SugarRoll.be Fact Sheet for Healthcare Providers: https://www.woods-mathews.com/ This test is not yet approved or cleared by the [place] FDA and  has been  authorized for detection and/or diagnosis of SARS-CoV-2 by FDA under an Emergency Use Authorization (EUA). This EUA will remain  in effect (meaning this test can be used) for the duration of the COVID-19 declaration under Section 56 4(b)(1) of the Act, 21 U.S.C. section 360bbb-3(b)(1), unless the authorization is terminated or revoked sooner. Performed at [facility], [place] [address], [place], [place] [address]   Aerobic/Anaerobic Culture (surgical/deep wound)     Status: None   Collection Time: 03/08/19  8:15 AM   Specimen: Soft Tissue, Other  Result Value Ref Range Status   Specimen Description FOOT LEFT  Final   Special Requests DEEP SOFT TISSUE CULTURE  Final   Gram Stain   Final    RARE WBC PRESENT, PREDOMINANTLY PMN FEW GRAM NEGATIVE RODS    Culture   Final    MODERATE ENTEROBACTER CLOACAE NO ANAEROBES ISOLATED Performed at [facility], [place] [address] [place], [place] [address]    Report Status 03/13/2019 FINAL  Final   Organism ID, Bacteria ENTEROBACTER CLOACAE  Final      Susceptibility   Enterobacter cloacae - MIC*    CEFAZOLIN >=64 RESISTANT Resistant     CEFEPIME <=1 SENSITIVE Sensitive     CEFTAZIDIME <=1 SENSITIVE Sensitive     CEFTRIAXONE <=1 SENSITIVE Sensitive     CIPROFLOXACIN <=0.25 SENSITIVE Sensitive     GENTAMICIN <=1 SENSITIVE Sensitive     IMIPENEM <=0.25 SENSITIVE Sensitive     TRIMETH/SULFA <=20 SENSITIVE Sensitive     PIP/TAZO <=4 SENSITIVE Sensitive     * MODERATE ENTEROBACTER CLOACAE  Blood culture (routine x 2)     Status: None (Preliminary result)   Collection Time: 03/12/19  1:00 PM   Specimen: BLOOD RIGHT WRIST  Result Value Ref Range Status   Specimen Description BLOOD RIGHT WRIST  Final   Special Requests   Final    BOTTLES DRAWN AEROBIC AND ANAEROBIC Blood Culture results may not be optimal due to an inadequate volume of blood received in culture bottles   Culture   Final    NO GROWTH 4 DAYS Performed at [facility], [place] [address] [place], [place] [address]    Report Status PENDING  Incomplete  Blood culture (routine x 2)     Status: None (Preliminary result)   Collection Time: 03/12/19  2:50 PM   Specimen: BLOOD RIGHT HAND  Result Value Ref Range Status   Specimen Description BLOOD RIGHT HAND  Final   Special Requests   Final    BOTTLES DRAWN AEROBIC AND ANAEROBIC Blood Culture results may not be optimal due to an inadequate volume  of blood received in culture bottles   Culture   Final    NO GROWTH 4 DAYS Performed at [facility], [place] [address] [place], [place] [address]    Report Status PENDING  Incomplete  SARS CORONAVIRUS 2 Nasal Swab Aptima Multi Swab     Status: None   Collection Time: 03/12/19  5:18 PM   Specimen: Aptima Multi Swab; Nasal Swab  Result Value Ref Range Status   SARS Coronavirus 2 NEGATIVE NEGATIVE Final    Comment: (NOTE) SARS-CoV-2 target nucleic acids are NOT DETECTED. The SARS-CoV-2 RNA is generally detectable in upper and lower respiratory specimens during the acute phase of infection. Negative results do not preclude SARS-CoV-2 infection, do not rule out co-infections with other pathogens, and should not be used as the sole basis for treatment or other patient management decisions. Negative results must be combined with clinical observations, patient history, and epidemiological information. The expected result is Negative. Fact Sheet for Patients: SugarRoll.be Fact Sheet for Healthcare Providers: https://www.woods-mathews.com/ This test is not yet approved or cleared by the [place] FDA and  has been authorized for detection and/or diagnosis of SARS-CoV-2 by FDA under an Emergency Use Authorization (EUA). This EUA will remain  in effect (meaning this test can be used) for the duration of the COVID-19 declaration under Section 56 4(b)(1) of the Act, 21 U.S.C. section 360bbb-3(b)(1), unless the  authorization is terminated or revoked sooner. Performed at [facility], [place] [address], [place], [place] [address]          Radiology Studies: No results found.      Scheduled Meds: . aspirin EC  81 mg Oral Daily  . calcitRIOL  0.5 mcg Oral Q M,W,F-HD  . Chlorhexidine Gluconate Cloth  6 each Topical Q0600  . Chlorhexidine Gluconate Cloth  6 each Topical Q0600  . cinacalcet  30 mg Oral QODAY  . collagenase  1 application Topical Daily  . docusate sodium  100 mg Oral Daily  . feeding supplement (NEPRO CARB STEADY)  237 mL Oral BID BM  . feeding supplement (PRO-STAT SUGAR FREE 64)  30 mL Oral BID  . ferric citrate  420 mg Oral TID WC  . ferric citrate  420 mg Oral With snacks  . heparin      . heparin  5,000 Units Subcutaneous Q8H  . midodrine      . midodrine  10 mg Oral Q M,W,F-HD  . multivitamin  1 tablet Oral QHS  . rosuvastatin  10 mg Oral q1800   Continuous Infusions: . magnesium sulfate bolus IVPB       LOS: 4 days    Time spent: 35 mins.More than 50% of that time was spent in counseling and/or coordination of care.      Shelly Coss, MD [facility] Pager [phone]  If 7PM-7AM, please contact night-coverage [email or web address] Password [facility] 03/16/2019, 2:34 PM

## 2019-03-16 NOTE — Progress Notes (Addendum)
[facility] Progress Note   Subjective:   Patient seen in room. Reports leg is feeling much better today but still has some pain. Denies SOB, dyspnea, cough, CP, palpitations, abdominal pain, N/V/D, edema.   Objective Vitals:   03/15/19 1703 03/15/19 2113 03/16/19 0446 03/16/19 1000  BP: 108/78 110/68 114/61 (!) 114/91  Pulse: 87 89 90 (!) 132  Resp:  18 18   Temp: 97.7 F (36.5 C) 97.8 F (36.6 C) 97.6 F (36.4 C)   TempSrc: Oral Oral Oral   SpO2: 97% 100% 98%   Weight:       Physical Exam General:Well developed male, alert and in NAD Heart:RRR, no murmurs rubs or gallops Lungs:CTA bilaterally without wheezing, rhonchi or rales Abdomen:Soft, non-tender, non-distended. +BS Extremities:No edema. L BKA stump wrapped. Dialysis Access:Right IJ TDC, no erythema/drainage   Additional Objective Labs: Basic Metabolic Panel: Recent Labs  Lab 03/14/19 0716 03/15/19 0618 03/16/19 0427  NA 134* 136 134*  K 3.8 4.1 4.7  CL 95* 97* 94*  CO2 21* 23 21*  GLUCOSE 83 71 108*  BUN 51* 31* 39*  CREATININE 8.67* 6.15* 7.41*  CALCIUM 9.5 9.2 9.3   Liver Function Tests: Recent Labs  Lab 03/12/19 1216  AST 17  ALT 6  ALKPHOS 66  BILITOT 0.9  PROT 8.1  ALBUMIN 3.0*   No results for input(s): LIPASE, AMYLASE in the last 168 hours. CBC: Recent Labs  Lab 03/12/19 1216 03/13/19 0543 03/14/19 0716 03/15/19 0618 03/16/19 0427  WBC 9.9 8.9 8.6 10.2 9.8  NEUTROABS 7.9*  --   --   --  7.7  HGB 11.7* 11.7* 11.5* 11.2* 10.8*  HCT 39.2 37.7* 37.8* 37.0* 36.4*  MCV 102.1* 98.2 98.4 100.3* 100.0  PLT 356 340 373 364 346   Blood Culture    Component Value Date/Time   SDES BLOOD RIGHT HAND 03/12/2019 1450   SPECREQUEST  03/12/2019 1450    BOTTLES DRAWN AEROBIC AND ANAEROBIC Blood Culture results may not be optimal due to an inadequate volume of blood received in culture bottles   CULT  03/12/2019 1450    NO GROWTH 3 DAYS Performed at [place], [place] [address], [place], [place] [address]    REPTSTATUS PENDING 03/12/2019 1450    CBG: Recent Labs  Lab 03/14/19 0727 03/14/19 2018 03/15/19 0912 03/15/19 1102 03/16/19 0700  GLUCAP 82 107* 89 97 98   Medications: . magnesium sulfate bolus IVPB     . aspirin EC  81 mg Oral Daily  . calcitRIOL  0.5 mcg Oral Q M,W,F-HD  . Chlorhexidine Gluconate Cloth  6 each Topical Q0600  . Chlorhexidine Gluconate Cloth  6 each Topical Q0600  . cinacalcet  30 mg Oral QODAY  . collagenase  1 application Topical Daily  . docusate sodium  100 mg Oral Daily  . feeding supplement (NEPRO CARB STEADY)  237 mL Oral BID BM  . feeding supplement (PRO-STAT SUGAR FREE 64)  30 mL Oral BID  . ferric citrate  420 mg Oral TID WC  . ferric citrate  420 mg Oral With snacks  . heparin  5,000 Units Subcutaneous Q8H  . midodrine  10 mg Oral Q M,W,F-HD  . multivitamin  1 tablet Oral QHS  . rosuvastatin  10 mg Oral q1800    Dialysis Orders: MWF [facility]  4h  400/800  78kg  3K/2.5Ca bat  Hep 5000 +2000 midrun  R IJ TDC Calcitriol 0.5   no Mircera or  Fe Recent labs: iPTH 248 Ca/P ok hgb 11.8 31% sat   Assessment/Plan: 1. PAD with critical limb ischemia - prior revascularization in July; seen by [facility] - no further vascular intervention available to improve circulation. Underwent BKA on 03/15/2019, reports pain is improved today.  2. Enterobacter wound culture 7/28 left foot - on Zosyn and Vanc now - afebrile, s/ BKA 3. ESRD- MWF, continue regular schedule. 1kg above EDW today. Will need EDW lowered due to BKA. K 4.1, will use 3K bath. No heparin post op.  4. Hypertension/volume- using midodrine for BP support. BP stable today. No edema or SOB. 5. Anemia-hgb 10.8 post op,no ESA/Feindicated at this time. 6. Metabolic bone disease- Calcium 9.3. Continue calcitriol/sensipar/Auryxia. Check phos with labs tomorrow.  7. NutritionAlbumin 3.0. Continue supplement. Renal diet with fluid restrictions  recommended. 8. DM- Per primary 9. Hx NICM s/p ICD  Anice Paganini, PA-C 03/16/2019, 10:38 AM  [facility] Pager: [phone]  Pt seen, examined and agree w A/P as above.  [name]  MD 03/16/2019, 2:23 PM

## 2019-03-16 NOTE — Evaluation (Signed)
Physical Therapy Evaluation Patient Details Name: Timothy Faulkner MRN: [id number] DOB: Dec 30, 1945 Today's Date: 03/16/2019   History of Present Illness  73 y.o. 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     Clinical Impression  Patient is s/p above surgery resulting in functional limitations due to the deficits listed below (see PT Problem List). PTA pt ambulating household distances with RW or SPC, living with wife and daughter. Today, educated on importance of reducing L knee flexion contracture, bed level therex. Came to standing EOB with mod A. Discussed role of ongoing physical therapy to help patient return to PLOF, as he states he is motivated to walk again and become more active.  Patient will benefit from skilled PT to increase their independence and safety with mobility to allow discharge to the venue listed below.       Follow Up Recommendations CIR    Equipment Recommendations  Wheelchair (measurements PT)    Recommendations for Other Services       Precautions / Restrictions Precautions Precautions: Fall      Mobility  Bed Mobility Overal bed mobility: Needs Assistance Bed Mobility: Supine to Sit     Supine to sit: Supervision;Min guard        Transfers Overall transfer level: Needs assistance Equipment used: Rolling walker (2 wheeled) Transfers: Sit to/from Stand Sit to Stand: Mod assist;+2 physical assistance         General transfer comment: mod Ax2 to power up   Ambulation/Gait         Gait velocity: deferred due to patient tolerance today      Stairs            Wheelchair Mobility    Modified Rankin (Stroke Patients Only)       Balance Overall balance assessment: Needs assistance Sitting-balance support: No upper extremity supported;Feet supported Sitting balance-Leahy Scale: Good     Standing balance support: During functional activity;Single  extremity supported Standing balance-Leahy Scale: Poor Standing balance comment: relies on UE support for balance                             Pertinent Vitals/Pain Pain Assessment: Faces Faces Pain Scale: Hurts even more Pain Location: L surgical site Pain Descriptors / Indicators: Guarding;Grimacing Pain Intervention(s): Limited activity within patient's tolerance    Home Living Family/patient expects to be discharged to:: Private residence Living Arrangements: Spouse/significant other Available Help at Discharge: Friend(s);Available 24 hours/day Type of Home: House Home Access: Level entry     Home Layout: One level Home Equipment: Cane - single point;Shower seat;Walker - 2 wheels Additional Comments: "bird baths"    Prior Function Level of Independence: Independent with assistive device(s)         Comments: would use SPC for steadying     Hand Dominance   Dominant Hand: Right    Extremity/Trunk Assessment   Upper Extremity Assessment Upper Extremity Assessment: Defer to OT evaluation    Lower Extremity Assessment Lower Extremity Assessment: Generalized weakness       Communication   Communication: No difficulties  Cognition Arousal/Alertness: Awake/alert Behavior During Therapy: WFL for tasks assessed/performed Overall Cognitive Status: Within Functional Limits for tasks assessed  General Comments      Exercises General Exercises - Lower Extremity Hip ABduction/ADduction: 10 reps Straight Leg Raises: 10 reps   Assessment/Plan    PT Assessment Patient needs continued PT services  PT Problem List Decreased activity tolerance;Decreased balance;Decreased mobility;Decreased knowledge of use of DME;Decreased safety awareness;Decreased knowledge of precautions       PT Treatment Interventions DME instruction;Gait training;Functional mobility training;Therapeutic activities;Therapeutic  exercise;Balance training;Patient/family education    PT Goals (Current goals can be found in the Care Plan section)  Acute Rehab PT Goals Patient Stated Goal: to walk again  PT Goal Formulation: With patient Time For Goal Achievement: 04/06/19 Potential to Achieve Goals: Good    Frequency Min 3X/week   Barriers to discharge        Co-evaluation PT/OT/SLP Co-Evaluation/Treatment: Yes Reason for Co-Treatment: For patient/therapist safety PT goals addressed during session: Mobility/safety with mobility;Strengthening/ROM;Proper use of DME;Balance         AM-PAC PT "6 Clicks" Mobility  Outcome Measure Help needed turning from your back to your side while in a flat bed without using bedrails?: None Help needed moving from lying on your back to sitting on the side of a flat bed without using bedrails?: A Little Help needed moving to and from a bed to a chair (including a wheelchair)?: A Little Help needed standing up from a chair using your arms (e.g., wheelchair or bedside chair)?: A Little Help needed to walk in hospital room?: A Little Help needed climbing 3-5 steps with a railing? : A Little 6 Click Score: 19    End of Session Equipment Utilized During Treatment: Gait belt Activity Tolerance: Patient tolerated treatment well Patient left: with call bell/phone within reach;in bed;with nursing/sitter in room Nurse Communication: Mobility status PT Visit Diagnosis: Muscle weakness (generalized) (M62.81)    Time: 5189-8421 PT Time Calculation (min) (ACUTE ONLY): 45 min   Charges:   PT Evaluation $PT Eval Moderate Complexity: 1 Mod PT Treatments $Therapeutic Activity: 8-22 mins        Reinaldo Berber, PT, DPT Acute Rehabilitation Services Pager: [phone] Office: [phone]    Reinaldo Berber 03/16/2019, 10:33 AM

## 2019-03-16 NOTE — Evaluation (Signed)
Occupational Therapy Evaluation Patient Details Name: Timothy Faulkner MRN: [id number] DOB: Feb 22, 1946 Today's Date: 03/16/2019    History of Present Illness 73 y.o. 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    Clinical Impression   Pt admitted with the above diagnoses and presents with below problem list. Pt will benefit from continued acute OT to address the below listed deficits and maximize independence with basic ADLs prior to d/c to next venue. PTA pt was mod I with ADLs. He is from home where he lives with his spouse and adult daughter. Pt currently mod +2 physical assist for LB ADLs, setup to min guard for UB ADLs. Stood for just under a minute EOB with min assist +2. Pt awaiting HD today. Feel he would be a good candidate for intensive rehab program prior to returning home.     Follow Up Recommendations  CIR    Equipment Recommendations  Other (comment)(defer to next venue)    Recommendations for Other Services       Precautions / Restrictions Precautions Precautions: Fall Restrictions Weight Bearing Restrictions: No      Mobility Bed Mobility Overal bed mobility: Needs Assistance Bed Mobility: Supine to Sit;Sit to Supine     Supine to sit: Supervision;Min guard Sit to supine: Min guard;HOB elevated   General bed mobility comments: extra time and effort. used momentum and rail to powerup. supervision-min guard for safety  Transfers Overall transfer level: Needs assistance Equipment used: Rolling walker (2 wheeled) Transfers: Sit to/from Stand Sit to Stand: Mod assist;+2 physical assistance         General transfer comment: mod Ax2 to power up from elevated bed height    Balance Overall balance assessment: Needs assistance Sitting-balance support: No upper extremity supported;Feet supported Sitting balance-Leahy Scale: Good     Standing balance support: During functional  activity;Single extremity supported Standing balance-Leahy Scale: Poor Standing balance comment: relies on UE support for balance                           ADL either performed or assessed with clinical judgement   ADL Overall ADL's : Needs assistance/impaired Eating/Feeding: Set up;Sitting   Grooming: Set up;Sitting   Upper Body Bathing: Set up;Sitting;Min guard   Lower Body Bathing: Moderate assistance;+2 for physical assistance;Sit to/from stand   Upper Body Dressing : Min guard;Set up   Lower Body Dressing: Moderate assistance;+2 for physical assistance;Sit to/from stand                 General ADL Comments: Pt completed bed mobility, stood 1x for just under a minute.      Vision         Perception     Praxis      Pertinent Vitals/Pain Pain Assessment: Faces Faces Pain Scale: Hurts even more Pain Location: L surgical site Pain Descriptors / Indicators: Guarding;Grimacing Pain Intervention(s): Limited activity within patient's tolerance;Monitored during session;Repositioned     Hand Dominance Right   Extremity/Trunk Assessment Upper Extremity Assessment Upper Extremity Assessment: Overall WFL for tasks assessed;Generalized weakness   Lower Extremity Assessment Lower Extremity Assessment: Defer to PT evaluation   Cervical / Trunk Assessment Cervical / Trunk Assessment: Normal   Communication Communication Communication: No difficulties   Cognition Arousal/Alertness: Awake/alert Behavior During Therapy: WFL for tasks assessed/performed Overall Cognitive Status: Within Functional Limits for tasks assessed  General Comments  Pt reporting "It feels so weird" in standing. VSS.        Shoulder Instructions      Home Living Family/patient expects to be discharged to:: Private residence Living Arrangements: Spouse/significant other Available Help at Discharge: Friend(s);Available 24  hours/day Type of Home: House Home Access: Level entry     Home Layout: One level     Bathroom Shower/Tub: Teacher, early years/pre: Standard     Home Equipment: Cane - single point;Shower seat;Walker - 2 wheels   Additional Comments: "bird baths"      Prior Functioning/Environment Level of Independence: Independent with assistive device(s)        Comments: would use SPC for steadying        OT Problem List: Decreased strength;Decreased activity tolerance;Impaired balance (sitting and/or standing);Decreased knowledge of use of DME or AE;Decreased knowledge of precautions;Pain      OT Treatment/Interventions: Self-care/ADL training;Energy conservation;Therapeutic exercise;DME and/or AE instruction;Therapeutic activities;Patient/family education;Balance training    OT Goals(Current goals can be found in the care plan section) Acute Rehab OT Goals Patient Stated Goal: to walk again, be outdoor more OT Goal Formulation: With patient Time For Goal Achievement: 03/30/19 Potential to Achieve Goals: Good ADL Goals Pt Will Perform Grooming: with set-up;sitting Pt Will Perform Upper Body Bathing: with set-up;sitting Pt Will Perform Lower Body Bathing: with min assist;sit to/from stand;with min guard assist Pt Will Perform Upper Body Dressing: with set-up;sitting Pt Will Perform Lower Body Dressing: with min guard assist;with min assist;sit to/from stand Pt Will Transfer to Toilet: with min assist;ambulating;with min guard assist;stand pivot transfer;bedside commode Pt Will Perform Toileting - Clothing Manipulation and hygiene: with min guard assist;sitting/lateral leans;sit to/from stand;with set-up;with min assist  OT Frequency: Min 2X/week   Barriers to D/C:            Co-evaluation PT/OT/SLP Co-Evaluation/Treatment: Yes Reason for Co-Treatment: For patient/therapist safety PT goals addressed during session: Mobility/safety with  mobility;Strengthening/ROM;Proper use of DME;Balance OT goals addressed during session: ADL's and self-care;Proper use of Adaptive equipment and DME      AM-PAC OT "6 Clicks" Daily Activity     Outcome Measure Help from another person eating meals?: None Help from another person taking care of personal grooming?: None Help from another person toileting, which includes using toliet, bedpan, or urinal?: A Lot Help from another person bathing (including washing, rinsing, drying)?: A Lot Help from another person to put on and taking off regular upper body clothing?: A Little Help from another person to put on and taking off regular lower body clothing?: A Lot 6 Click Score: 17   End of Session Equipment Utilized During Treatment: Gait belt;Rolling walker Nurse Communication: Mobility status;Precautions;Other (comment)(promoting L knee extension with pt)  Activity Tolerance: Patient tolerated treatment well;Patient limited by fatigue Patient left: in bed;with call bell/phone within reach;with bed alarm set  OT Visit Diagnosis: Other abnormalities of gait and mobility (R26.89);Muscle weakness (generalized) (M62.81);Pain Pain - Right/Left: Left Pain - part of body: Leg                Time: 7253-6644 OT Time Calculation (min): 23 min Charges:  OT General Charges $OT Visit: 1 Visit OT Evaluation $OT Eval Moderate Complexity: [place], OT Acute Rehabilitation Services Pager: [phone] Office: [phone]   Hortencia Pilar 03/16/2019, 10:58 AM

## 2019-03-16 NOTE — Care Management Important Message (Signed)
Important Message  Patient Details  Name: Timothy Faulkner MRN: [id number] Date of Birth: [birth year]   Medicare Important Message Given:  Yes     Eliasar Hlavaty 03/16/2019, 1:22 PM

## 2019-03-16 NOTE — Care Management Important Message (Signed)
Important Message  Patient Details  Name: Timothy Faulkner MRN: [id number] Date of Birth: Feb 18, 1946   Medicare Important Message Given:  Yes     Orbie Pyo 03/16/2019, 1:04 PM

## 2019-03-16 NOTE — Progress Notes (Signed)
PT IS IN HD

## 2019-03-16 NOTE — Plan of Care (Signed)
  Problem: Clinical Measurements: Goal: Ability to maintain clinical measurements within normal limits will improve Outcome: Progressing   

## 2019-03-16 NOTE — Progress Notes (Signed)
Inpatient Rehabilitation Admissions Coordinator  Inpatient rehab consult received. Pt currently in hemodialysis. I will begin insurance authorization with [place] for a possible inpt rehab admit pending insurance approval and pt discussion.  Danne Baxter, RN, MSN Rehab Admissions Coordinator [phone] 03/16/2019 12:40 PM

## 2019-03-17 ENCOUNTER — Telehealth: Payer: Self-pay | Admitting: Podiatry

## 2019-03-17 ENCOUNTER — Other Ambulatory Visit: Payer: Self-pay

## 2019-03-17 ENCOUNTER — Ambulatory Visit: Payer: Medicare Other | Admitting: Podiatry

## 2019-03-17 ENCOUNTER — Encounter (HOSPITAL_COMMUNITY): Payer: Self-pay | Admitting: *Deleted

## 2019-03-17 ENCOUNTER — Inpatient Hospital Stay (HOSPITAL_COMMUNITY)
Admission: RE | Admit: 2019-03-17 | Discharge: 2019-03-31 | DRG: 559 | Disposition: A | Discharge status: Home Health | Payer: Medicare Other | Source: Intra-hospital | Attending: Physical Medicine and Rehabilitation | Admitting: Physical Medicine and Rehabilitation

## 2019-03-17 DIAGNOSIS — Z87891 Personal history of nicotine dependence: Secondary | ICD-10-CM

## 2019-03-17 DIAGNOSIS — I428 Other cardiomyopathies: Secondary | ICD-10-CM | POA: Diagnosis present

## 2019-03-17 DIAGNOSIS — E1151 Type 2 diabetes mellitus with diabetic peripheral angiopathy without gangrene: Secondary | ICD-10-CM | POA: Diagnosis present

## 2019-03-17 DIAGNOSIS — R34 Anuria and oliguria: Secondary | ICD-10-CM | POA: Diagnosis present

## 2019-03-17 DIAGNOSIS — Z7982 Long term (current) use of aspirin: Secondary | ICD-10-CM

## 2019-03-17 DIAGNOSIS — H3530 Unspecified macular degeneration: Secondary | ICD-10-CM | POA: Diagnosis present

## 2019-03-17 DIAGNOSIS — Z8249 Family history of ischemic heart disease and other diseases of the circulatory system: Secondary | ICD-10-CM

## 2019-03-17 DIAGNOSIS — I509 Heart failure, unspecified: Secondary | ICD-10-CM | POA: Diagnosis present

## 2019-03-17 DIAGNOSIS — K219 Gastro-esophageal reflux disease without esophagitis: Secondary | ICD-10-CM | POA: Diagnosis present

## 2019-03-17 DIAGNOSIS — L97512 Non-pressure chronic ulcer of other part of right foot with fat layer exposed: Secondary | ICD-10-CM

## 2019-03-17 DIAGNOSIS — M898X9 Other specified disorders of bone, unspecified site: Secondary | ICD-10-CM | POA: Diagnosis present

## 2019-03-17 DIAGNOSIS — D62 Acute posthemorrhagic anemia: Secondary | ICD-10-CM | POA: Diagnosis present

## 2019-03-17 DIAGNOSIS — G546 Phantom limb syndrome with pain: Secondary | ICD-10-CM | POA: Diagnosis present

## 2019-03-17 DIAGNOSIS — K5903 Drug induced constipation: Secondary | ICD-10-CM | POA: Diagnosis not present

## 2019-03-17 DIAGNOSIS — D638 Anemia in other chronic diseases classified elsewhere: Secondary | ICD-10-CM

## 2019-03-17 DIAGNOSIS — E7800 Pure hypercholesterolemia, unspecified: Secondary | ICD-10-CM | POA: Diagnosis present

## 2019-03-17 DIAGNOSIS — I132 Hypertensive heart and chronic kidney disease with heart failure and with stage 5 chronic kidney disease, or end stage renal disease: Secondary | ICD-10-CM | POA: Diagnosis present

## 2019-03-17 DIAGNOSIS — N186 End stage renal disease: Secondary | ICD-10-CM | POA: Diagnosis present

## 2019-03-17 DIAGNOSIS — E1122 Type 2 diabetes mellitus with diabetic chronic kidney disease: Secondary | ICD-10-CM | POA: Diagnosis present

## 2019-03-17 DIAGNOSIS — I9589 Other hypotension: Secondary | ICD-10-CM | POA: Diagnosis present

## 2019-03-17 DIAGNOSIS — Z89512 Acquired absence of left leg below knee: Secondary | ICD-10-CM

## 2019-03-17 DIAGNOSIS — G8918 Other acute postprocedural pain: Secondary | ICD-10-CM | POA: Diagnosis not present

## 2019-03-17 DIAGNOSIS — I739 Peripheral vascular disease, unspecified: Secondary | ICD-10-CM | POA: Diagnosis not present

## 2019-03-17 DIAGNOSIS — Z992 Dependence on renal dialysis: Secondary | ICD-10-CM | POA: Diagnosis not present

## 2019-03-17 DIAGNOSIS — Z7902 Long term (current) use of antithrombotics/antiplatelets: Secondary | ICD-10-CM | POA: Diagnosis not present

## 2019-03-17 DIAGNOSIS — Z4781 Encounter for orthopedic aftercare following surgical amputation: Secondary | ICD-10-CM | POA: Diagnosis present

## 2019-03-17 DIAGNOSIS — Z95810 Presence of automatic (implantable) cardiac defibrillator: Secondary | ICD-10-CM

## 2019-03-17 DIAGNOSIS — M109 Gout, unspecified: Secondary | ICD-10-CM | POA: Diagnosis present

## 2019-03-17 DIAGNOSIS — D631 Anemia in chronic kidney disease: Secondary | ICD-10-CM | POA: Diagnosis present

## 2019-03-17 DIAGNOSIS — S88112A Complete traumatic amputation at level between knee and ankle, left lower leg, initial encounter: Secondary | ICD-10-CM | POA: Diagnosis present

## 2019-03-17 LAB — RENAL FUNCTION PANEL
Albumin: 2.8 g/dL — ABNORMAL LOW (ref 3.5–5.0)
Anion gap: 17 — ABNORMAL HIGH (ref 5–15)
BUN: 25 mg/dL — ABNORMAL HIGH (ref 8–23)
CO2: 23 mmol/L (ref 22–32)
Calcium: 9.1 mg/dL (ref 8.9–10.3)
Chloride: 92 mmol/L — ABNORMAL LOW (ref 98–111)
Creatinine, Ser: 5.64 mg/dL — ABNORMAL HIGH (ref 0.61–1.24)
GFR calc Af Amer: 11 mL/min — ABNORMAL LOW (ref >60)
GFR calc non Af Amer: 9 mL/min — ABNORMAL LOW (ref >60)
Glucose, Bld: 107 mg/dL — ABNORMAL HIGH (ref 70–99)
Phosphorus: 2.6 mg/dL (ref 2.5–4.6)
Potassium: 4.5 mmol/L (ref 3.5–5.1)
Sodium: 132 mmol/L — ABNORMAL LOW (ref 135–145)

## 2019-03-17 LAB — CBC WITH DIFFERENTIAL/PLATELET
Abs Immature Granulocytes: 0.13 10*3/uL — ABNORMAL HIGH (ref 0.00–0.07)
Basophils Absolute: 0 10*3/uL (ref 0.0–0.1)
Basophils Relative: 1 %
Eosinophils Absolute: 0.1 10*3/uL (ref 0.0–0.5)
Eosinophils Relative: 2 %
HCT: 30.7 % — ABNORMAL LOW (ref 39.0–52.0)
Hemoglobin: 9.4 g/dL — ABNORMAL LOW (ref 13.0–17.0)
Immature Granulocytes: 2 %
Lymphocytes Relative: 16 %
Lymphs Abs: 1.3 10*3/uL (ref 0.7–4.0)
MCH: 30.5 pg (ref 26.0–34.0)
MCHC: 30.6 g/dL (ref 30.0–36.0)
MCV: 99.7 fL (ref 80.0–100.0)
Monocytes Absolute: 1 10*3/uL (ref 0.1–1.0)
Monocytes Relative: 12 %
Neutro Abs: 5.4 10*3/uL (ref 1.7–7.7)
Neutrophils Relative %: 67 %
Platelets: 338 10*3/uL (ref 150–400)
RBC: 3.08 MIL/uL — ABNORMAL LOW (ref 4.22–5.81)
RDW: 17 % — ABNORMAL HIGH (ref 11.5–15.5)
WBC: 7.9 10*3/uL (ref 4.0–10.5)
nRBC: 0.3 % — ABNORMAL HIGH (ref 0.0–0.2)

## 2019-03-17 LAB — CBC
HCT: 32.6 % — ABNORMAL LOW (ref 39.0–52.0)
Hemoglobin: 9.8 g/dL — ABNORMAL LOW (ref 13.0–17.0)
MCH: 30 pg (ref 26.0–34.0)
MCHC: 30.1 g/dL (ref 30.0–36.0)
MCV: 99.7 fL (ref 80.0–100.0)
Platelets: 359 10*3/uL (ref 150–400)
RBC: 3.27 MIL/uL — ABNORMAL LOW (ref 4.22–5.81)
RDW: 17.2 % — ABNORMAL HIGH (ref 11.5–15.5)
WBC: 7.6 10*3/uL (ref 4.0–10.5)
nRBC: 0 % (ref 0.0–0.2)

## 2019-03-17 LAB — GLUCOSE, CAPILLARY
Glucose-Capillary: 68 mg/dL — ABNORMAL LOW (ref 70–99)
Glucose-Capillary: 87 mg/dL (ref 70–99)

## 2019-03-17 LAB — BASIC METABOLIC PANEL
Anion gap: 16 — ABNORMAL HIGH (ref 5–15)
BUN: 20 mg/dL (ref 8–23)
CO2: 24 mmol/L (ref 22–32)
Calcium: 8.9 mg/dL (ref 8.9–10.3)
Chloride: 93 mmol/L — ABNORMAL LOW (ref 98–111)
Creatinine, Ser: 4.53 mg/dL — ABNORMAL HIGH (ref 0.61–1.24)
GFR calc Af Amer: 14 mL/min — ABNORMAL LOW (ref >60)
GFR calc non Af Amer: 12 mL/min — ABNORMAL LOW (ref >60)
Glucose, Bld: 77 mg/dL (ref 70–99)
Potassium: 3.7 mmol/L (ref 3.5–5.1)
Sodium: 133 mmol/L — ABNORMAL LOW (ref 135–145)

## 2019-03-17 LAB — CULTURE, BLOOD (ROUTINE X 2)
Culture: NO GROWTH
Culture: NO GROWTH

## 2019-03-17 LAB — PHOSPHORUS: Phosphorus: 2.8 mg/dL (ref 2.5–4.6)

## 2019-03-17 MED ORDER — DARBEPOETIN ALFA 40 MCG/0.4ML IJ SOSY: 40.0000 ug | PREFILLED_SYRINGE | INTRAMUSCULAR | Status: DC

## 2019-03-17 MED ORDER — HEPARIN SODIUM (PORCINE) 1000 UNIT/ML DIALYSIS
3500.0000 [IU] | INTRAMUSCULAR | Status: DC | PRN
  Filled 2019-03-17: qty 4

## 2019-03-17 MED ORDER — PENTAFLUOROPROP-TETRAFLUOROETH EX AERO: 1.0000 "application " | INHALATION_SPRAY | CUTANEOUS | Status: DC | PRN

## 2019-03-17 MED ORDER — MIDODRINE HCL 5 MG PO TABS
10.0000 mg | ORAL_TABLET | ORAL | Status: DC
  Administered 2019-03-18 – 2019-03-30 (×5): 10 mg via ORAL
  Filled 2019-03-17 (×5): qty 2

## 2019-03-17 MED ORDER — GUAIFENESIN-DM 100-10 MG/5ML PO SYRP: 5.0000 mL | ORAL_SOLUTION | Freq: Four times a day (QID) | ORAL | Status: DC | PRN

## 2019-03-17 MED ORDER — HEPARIN SODIUM (PORCINE) 1000 UNIT/ML DIALYSIS
1000.0000 [IU] | INTRAMUSCULAR | Status: DC | PRN
  Filled 2019-03-17: qty 1

## 2019-03-17 MED ORDER — CALCIUM CARBONATE ANTACID 1250 MG/5ML PO SUSP
500.0000 mg | Freq: Four times a day (QID) | ORAL | Status: DC | PRN
  Administered 2019-03-30: 500 mg via ORAL
  Filled 2019-03-17 (×3): qty 5

## 2019-03-17 MED ORDER — DOCUSATE SODIUM 100 MG PO CAPS
100.0000 mg | ORAL_CAPSULE | Freq: Every day | ORAL | Status: DC
  Administered 2019-03-18 – 2019-03-23 (×5): 100 mg via ORAL
  Filled 2019-03-17 (×5): qty 1

## 2019-03-17 MED ORDER — TRAZODONE HCL 50 MG PO TABS: 25.0000 mg | ORAL_TABLET | Freq: Every evening | ORAL | Status: DC | PRN

## 2019-03-17 MED ORDER — CHLORHEXIDINE GLUCONATE CLOTH 2 % EX PADS
6.0000 | MEDICATED_PAD | Freq: Every day | CUTANEOUS | Status: DC
  Administered 2019-03-24 – 2019-03-26 (×2): 6 via TOPICAL

## 2019-03-17 MED ORDER — FERRIC CITRATE 1 GM 210 MG(FE) PO TABS
420.0000 mg | ORAL_TABLET | ORAL | Status: DC
  Administered 2019-03-17 – 2019-03-21 (×4): 420 mg via ORAL
  Filled 2019-03-17 (×15): qty 2

## 2019-03-17 MED ORDER — PHENOL 1.4 % MT LIQD: 1.0000 | OROMUCOSAL | Status: DC | PRN

## 2019-03-17 MED ORDER — ASPIRIN EC 81 MG PO TBEC
81.0000 mg | DELAYED_RELEASE_TABLET | Freq: Every day | ORAL | Status: DC
  Administered 2019-03-18 – 2019-03-31 (×14): 81 mg via ORAL
  Filled 2019-03-17 (×14): qty 1

## 2019-03-17 MED ORDER — POLYETHYLENE GLYCOL 3350 17 G PO PACK
17.0000 g | PACK | Freq: Every day | ORAL | Status: DC | PRN
  Administered 2019-03-19 – 2019-03-30 (×3): 17 g via ORAL
  Filled 2019-03-17 (×3): qty 1

## 2019-03-17 MED ORDER — CAMPHOR-MENTHOL 0.5-0.5 % EX LOTN
1.0000 "application " | TOPICAL_LOTION | Freq: Three times a day (TID) | CUTANEOUS | Status: DC | PRN
  Administered 2019-03-19: 1 via TOPICAL
  Filled 2019-03-17: qty 222

## 2019-03-17 MED ORDER — LIDOCAINE HCL (PF) 1 % IJ SOLN: 5.0000 mL | INTRAMUSCULAR | Status: DC | PRN

## 2019-03-17 MED ORDER — FERRIC CITRATE 1 GM 210 MG(FE) PO TABS
420.0000 mg | ORAL_TABLET | Freq: Three times a day (TID) | ORAL | Status: DC
  Administered 2019-03-17 – 2019-03-22 (×15): 420 mg via ORAL
  Filled 2019-03-17 (×18): qty 2

## 2019-03-17 MED ORDER — SODIUM CHLORIDE 0.9 % IV SOLN: 100.0000 mL | INTRAVENOUS | Status: DC | PRN

## 2019-03-17 MED ORDER — ONDANSETRON HCL 4 MG PO TABS: 4.0000 mg | ORAL_TABLET | Freq: Four times a day (QID) | ORAL | Status: DC | PRN

## 2019-03-17 MED ORDER — HEPARIN SODIUM (PORCINE) 5000 UNIT/ML IJ SOLN
5000.0000 [IU] | Freq: Three times a day (TID) | INTRAMUSCULAR | Status: DC
  Administered 2019-03-17 – 2019-03-31 (×35): 5000 [IU] via SUBCUTANEOUS
  Filled 2019-03-17 (×35): qty 1

## 2019-03-17 MED ORDER — PROCHLORPERAZINE MALEATE 5 MG PO TABS: 5.0000 mg | ORAL_TABLET | Freq: Four times a day (QID) | ORAL | Status: DC | PRN

## 2019-03-17 MED ORDER — CHLORHEXIDINE GLUCONATE CLOTH 2 % EX PADS: 6.0000 | MEDICATED_PAD | Freq: Every day | CUTANEOUS | Status: DC

## 2019-03-17 MED ORDER — ROSUVASTATIN CALCIUM 5 MG PO TABS
10.0000 mg | ORAL_TABLET | Freq: Every day | ORAL | Status: DC
  Administered 2019-03-17 – 2019-03-30 (×14): 10 mg via ORAL
  Filled 2019-03-17 (×15): qty 2

## 2019-03-17 MED ORDER — PRO-STAT SUGAR FREE PO LIQD
30.0000 mL | Freq: Two times a day (BID) | ORAL | Status: DC
  Administered 2019-03-17 – 2019-03-31 (×19): 30 mL via ORAL
  Filled 2019-03-17 (×26): qty 30

## 2019-03-17 MED ORDER — COLLAGENASE 250 UNIT/GM EX OINT
1.0000 "application " | TOPICAL_OINTMENT | Freq: Every day | CUTANEOUS | Status: DC
  Administered 2019-03-18 – 2019-03-21 (×3): 1 via TOPICAL
  Filled 2019-03-17: qty 30

## 2019-03-17 MED ORDER — ACETAMINOPHEN 500 MG PO TABS
1000.0000 mg | ORAL_TABLET | Freq: Once | ORAL | Status: AC
  Administered 2019-03-17: 1000 mg via ORAL
  Filled 2019-03-17: qty 2

## 2019-03-17 MED ORDER — ALTEPLASE 2 MG IJ SOLR
2.0000 mg | Freq: Once | INTRAMUSCULAR | Status: DC | PRN
  Filled 2019-03-17: qty 2

## 2019-03-17 MED ORDER — CHLORHEXIDINE GLUCONATE CLOTH 2 % EX PADS
6.0000 | MEDICATED_PAD | Freq: Every day | CUTANEOUS | Status: DC
  Administered 2019-03-18 – 2019-03-20 (×3): 6 via TOPICAL

## 2019-03-17 MED ORDER — DOCUSATE SODIUM 283 MG RE ENEM
1.0000 | ENEMA | RECTAL | Status: DC | PRN
  Filled 2019-03-17: qty 1

## 2019-03-17 MED ORDER — ONDANSETRON HCL 4 MG/2ML IJ SOLN: 4.0000 mg | Freq: Four times a day (QID) | INTRAMUSCULAR | Status: DC | PRN

## 2019-03-17 MED ORDER — OXYCODONE HCL 5 MG PO TABS
5.0000 mg | ORAL_TABLET | Freq: Three times a day (TID) | ORAL | Status: DC | PRN
  Administered 2019-03-18 – 2019-03-21 (×8): 5 mg via ORAL
  Filled 2019-03-17 (×9): qty 1

## 2019-03-17 MED ORDER — BISACODYL 10 MG RE SUPP
10.0000 mg | Freq: Every day | RECTAL | Status: DC | PRN
  Administered 2019-03-19: 01:00:00 10 mg via RECTAL
  Filled 2019-03-17: qty 1

## 2019-03-17 MED ORDER — DIPHENHYDRAMINE HCL 12.5 MG/5ML PO ELIX: 12.5000 mg | ORAL_SOLUTION | Freq: Four times a day (QID) | ORAL | Status: DC | PRN

## 2019-03-17 MED ORDER — LIDOCAINE-PRILOCAINE 2.5-2.5 % EX CREA
1.0000 "application " | TOPICAL_CREAM | CUTANEOUS | Status: DC | PRN
  Filled 2019-03-17: qty 5

## 2019-03-17 MED ORDER — PROCHLORPERAZINE 25 MG RE SUPP: 12.5000 mg | Freq: Four times a day (QID) | RECTAL | Status: DC | PRN

## 2019-03-17 MED ORDER — CINACALCET HCL 30 MG PO TABS
30.0000 mg | ORAL_TABLET | ORAL | Status: DC
  Administered 2019-03-18 – 2019-03-30 (×7): 30 mg via ORAL
  Filled 2019-03-17 (×8): qty 1

## 2019-03-17 MED ORDER — CALCITRIOL 0.25 MCG PO CAPS
0.5000 ug | ORAL_CAPSULE | ORAL | Status: DC
  Administered 2019-03-18 – 2019-03-30 (×6): 0.5 ug via ORAL
  Filled 2019-03-17 (×3): qty 2

## 2019-03-17 MED ORDER — HEPARIN SODIUM (PORCINE) 1000 UNIT/ML DIALYSIS
20.0000 [IU]/kg | INTRAMUSCULAR | Status: DC | PRN
  Filled 2019-03-17: qty 2

## 2019-03-17 MED ORDER — HYDROXYZINE HCL 25 MG PO TABS
25.0000 mg | ORAL_TABLET | Freq: Three times a day (TID) | ORAL | Status: DC | PRN
  Filled 2019-03-17: qty 1

## 2019-03-17 MED ORDER — ACETAMINOPHEN 325 MG PO TABS
325.0000 mg | ORAL_TABLET | ORAL | Status: DC | PRN
  Administered 2019-03-19 – 2019-03-23 (×8): 650 mg via ORAL
  Administered 2019-03-24: 11:00:00 325 mg via ORAL
  Administered 2019-03-28 – 2019-03-29 (×2): 650 mg via ORAL
  Filled 2019-03-17 (×13): qty 2

## 2019-03-17 MED ORDER — PROCHLORPERAZINE EDISYLATE 10 MG/2ML IJ SOLN: 5.0000 mg | Freq: Four times a day (QID) | INTRAMUSCULAR | Status: DC | PRN

## 2019-03-17 MED ORDER — RENA-VITE PO TABS
1.0000 | ORAL_TABLET | Freq: Every day | ORAL | Status: DC
  Administered 2019-03-17 – 2019-03-30 (×14): 1 via ORAL
  Filled 2019-03-17 (×14): qty 1

## 2019-03-17 NOTE — Progress Notes (Addendum)
[facility] Progress Note   Subjective:   Seen in room. Feeling well today, no new concerns. Denies SOB, dyspnea, CP, palpitations, abdominal pain, N/V/D. He is being discharged to CIR.  Objective Vitals:   03/16/19 1647 03/16/19 2038 03/17/19 0520 03/17/19 0749  BP: 99/68 133/72 109/76 120/77  Pulse: (!) 101 85 100 94  Resp: 18 16 16 17   Temp: 98.3 F (36.8 C) 98.4 F (36.9 C) 98.2 F (36.8 C) 98 F (36.7 C)  TempSrc: Oral Oral  Oral  SpO2: 98% 99% 96% 100%  Weight:  79 kg     Physical Exam General:Well developed male, alert and in NAD Heart:RRR, no murmurs rubs or gallops Lungs:CTA bilaterally without wheezing, rhonchi or rales Abdomen:Soft, non-tender, non-distended. +BS Extremities:No edema. LBKA stump wrapped. Dialysis Access:Right IJ TDC,no erythema/drainage   Additional Objective Labs: Basic Metabolic Panel: Recent Labs  Lab 03/15/19 0618 03/16/19 0427 03/17/19 0554  NA 136 134* 133*  K 4.1 4.7 3.7  CL 97* 94* 93*  CO2 23 21* 24  GLUCOSE 71 108* 77  BUN 31* 39* 20  CREATININE 6.15* 7.41* 4.53*  CALCIUM 9.2 9.3 8.9  PHOS  --   --  2.8   Liver Function Tests: Recent Labs  Lab 03/12/19 1216  AST 17  ALT 6  ALKPHOS 66  BILITOT 0.9  PROT 8.1  ALBUMIN 3.0*   CBC: Recent Labs  Lab 03/12/19 1216 03/13/19 0543 03/14/19 0716 03/15/19 0618 03/16/19 0427 03/17/19 0554  WBC 9.9 8.9 8.6 10.2 9.8 7.9  NEUTROABS 7.9*  --   --   --  7.7 5.4  HGB 11.7* 11.7* 11.5* 11.2* 10.8* 9.4*  HCT 39.2 37.7* 37.8* 37.0* 36.4* 30.7*  MCV 102.1* 98.2 98.4 100.3* 100.0 99.7  PLT 356 340 373 364 346 338   Blood Culture    Component Value Date/Time   SDES BLOOD RIGHT HAND 03/12/2019 1450   SPECREQUEST  03/12/2019 1450    BOTTLES DRAWN AEROBIC AND ANAEROBIC Blood Culture results may not be optimal due to an inadequate volume of blood received in culture bottles   CULT  03/12/2019 1450    NO GROWTH 4 DAYS Performed at [facility], [place] [address], [place], [place] [address]    REPTSTATUS PENDING 03/12/2019 1450    Cardiac Enzymes: No results for input(s): CKTOTAL, CKMB, CKMBINDEX, TROPONINI in the last 168 hours. CBG: Recent Labs  Lab 03/16/19 1124 03/16/19 1628 03/16/19 2039 03/17/19 0656 03/17/19 1120  GLUCAP 135* 119* 127* 68* 87   Medications: . magnesium sulfate bolus IVPB     . aspirin EC  81 mg Oral Daily  . calcitRIOL  0.5 mcg Oral Q M,W,F-HD  . Chlorhexidine Gluconate Cloth  6 each Topical Q0600  . Chlorhexidine Gluconate Cloth  6 each Topical Q0600  . cinacalcet  30 mg Oral QODAY  . collagenase  1 application Topical Daily  . docusate sodium  100 mg Oral Daily  . feeding supplement (NEPRO CARB STEADY)  237 mL Oral BID BM  . feeding supplement (PRO-STAT SUGAR FREE 64)  30 mL Oral BID  . ferric citrate  420 mg Oral TID WC  . ferric citrate  420 mg Oral With snacks  . heparin  5,000 Units Subcutaneous Q8H  . midodrine  10 mg Oral Q M,W,F-HD  . multivitamin  1 tablet Oral QHS  . rosuvastatin  10 mg Oral q1800    Dialysis Orders: 4h 400/800 78kg 3K/2.5Ca bat Hep 5000 +2000 midrun  R IJ TDC Calcitriol 0.5  no Mircera or Fe Recent labs: iPTH 248 Ca/P ok hgb 11.8 31% sat  Assessment/Plan: 1. PAD with critical limb ischemia - prior revascularization in July; seen by [facility] - no further vascular intervention available to improve circulation. Underwent BKA on 03/15/2019, reports pain is improved today. CIR has been recommended. 2. Enterobacter wound culture 7/28 left foot - completed course of Zosyn and Vanc - afebrile, s/ BKA 3. ESRD- MWF, continue regular schedule. 1kg above EDW yesterday, needs weights with dialysis tomorrow. Will need EDW lowered due to BKA.K3.7, will use 3K bath. No heparin post op. 4. Hypertension/volume- using midodrine for BP support. BP stable today. No edema or SOB. 5. Anemia-hgb 9.4- declined post op, will start aranesp 6. Metabolic bone disease-Calcium  8.9. Continue calcitriol/sensipar/Auryxia. Phos 2.8- will decrease auryxia to 1/meal.  7. NutritionAlbumin 3.0. Continue supplement. Renal diet with fluid restrictions recommended. 8. DM- Per primary 9. Hx NICM s/p ICD  Anice Paganini, PA-C 03/17/2019, 1:17 PM  [facility] Pager: [phone]  Pt seen, examined and agree w A/P as above.  [name]  MD 03/18/2019, 8:07 AM

## 2019-03-17 NOTE — Progress Notes (Signed)
Received pt. As a new admission,pt. And his daughter were oriented to the unit protocol,safety plan was explained.Fall prevention plan was signed and explained.

## 2019-03-17 NOTE — Progress Notes (Signed)
Meredith Staggers, MD  Physician  Physical Medicine and Rehabilitation  PMR Pre-admission  Signed  Date of Service:  03/17/2019 10:03 AM      Related encounter: ED to Hosp-Admission (Current) from 03/12/2019 in [facility]      Signed         Show:Clear all '[x]' Manual'[x]' Template'[]' Copied  Added by: '[x]' Cristina Gong, RN'[x]' Meredith Staggers, MD  '[]' Hover for details PMR Admission Coordinator Pre-Admission Assessment  Patient: Timothy Faulkner is an 73 y.o., male MRN: [id number] DOB: 1946/05/16 Height:   Weight: 79 kg  Insurance Information HMO: yes    PPO:      PCP:      IPA:      80/20:      OTHER: medicare advantage PRIMARY: [employer] Medicare      Policy#: [id number]      Subscriber: pt CM Name: Romie Minus      Phone#: [phone] ext none given     Fax#: [phone] Pre-Cert#: [id number] approved for 7 days with f/uJill Marshell Levan phone [phone] fax [phone]     Employer: n/a Benefits:  Phone #: [phone]   Name:  Irene Shipper. Date: 11/10/2018     Deduct: none      Out of Pocket Max: $4500 / met     Life Max: none CIR: $325 co pay per day days 1 until 5      SNF: no co pay days 1 until 20; $160 co pay per day days 21 until 49, no co pay days 50 until 100 Outpatient: $35 co pay per visit     Co-Pay: visits per medical neccesity  Home Health: 100%      Co-Pay: visits per medical neccesity DME: 80%     Co-Pay: 20% Providers: in network  SECONDARY: none       Medicaid Application Date:       Case Manager:  Disability Application Date:       Case Worker:   The "Data Collection Information Summary" for patients in Inpatient Rehabilitation Facilities with attached "Privacy Act [place] Records" was provided and verbally reviewed with: Patient and Family  Emergency Contact Information         Contact Information    Name Relation Home Work Mobile   [place] Spouse [phone]     Birdie Sons Daughter [phone]   [phone]   Kethan, Papadopoulos [phone]        Current Medical History  Patient Admitting Diagnosis: BKA  History of Present Illness: 73 year old male with medical history significant for DM,ESRD on dialysis, CHF, gout, HTN, macular degeneration, pacemaker and PNA . Presented on 03/12/2019 with history of peripheral vascular occlusive diease. Recent revascularization procedure/angioplasty of his left tibioperoneal trunk and posterior tibial artery on 7/17 and on 7/20 underwent angioplasty of the right tibioperoneal trunk and posterior tibial artery. On 03/08/2019 underwent amputation of his left third and fourth toes.  Presented to ED on 03/12/2019 with poorly healing amputations . 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 Post operatively pain management. 03/08/2019 enterobacter would culture placed  on Zosyn and Vanc. Antibiotics d/c'd per primary MD team 03/16/2019.   Nephrology consulted for ongoing dialysis management. Dialysis MWF. Will need EDW lowered due to BKA. Midodrine used for BP support. Anemia Hgb 9.4 on 8/6 postoperatively. Will monitor.   Diabetes with monitoring of CBGS. Heparin SQ for DVT prophylaxis.  Patient's medical record from [facility] has been reviewed by the rehabilitation admission coordinator and physician.  Past  Medical History      Past Medical History:  Diagnosis Date  . Anemia   . Arthritis    Gout  . Automatic implantable cardioverter-defibrillator in situ    [employer]  . CHF   . ESRD on dialysis [facility])    [name] 03/11/2013 (03/11/2013) dialysis M/W/F  . Gangrene ([place])    left foot  . GERD (gastroesophageal reflux disease)   . Gout    "once a year"  . Heart murmur   . HYPERCHOLESTEROLEMIA, MIXED   . HYPERTENSION   . Macular degeneration   . Osteomyelitis ([place])    right foot  . 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 Pacemaker   . Peripheral arterial disease (HCC)    left fifth toe ulcer, healing  .  Pneumonia   . Shortness of breath   . 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 Wears dentures   . Wears glasses     Family History   family history includes CAD in his father; Heart disease in his father.  Prior Rehab/Hospitalizations Has the patient had prior rehab or hospitalizations prior to admission? Yes  Has the patient had major surgery during 100 days prior to admission? Yes             Current Medications  Current Facility-Administered Medications:  .  acetaminophen (TYLENOL) tablet 650 mg, 650 mg, Oral, Q6H PRN, 650 mg at 03/12/19 2225 **OR** acetaminophen (TYLENOL) suppository 650 mg, 650 mg, Rectal, Q6H PRN, Elam Dutch, MD .  aspirin EC tablet 81 mg, 81 mg, Oral, Daily, Elam Dutch, MD, 81 mg at 03/16/19 1645 .  calcitRIOL (ROCALTROL) capsule 0.5 mcg, 0.5 mcg, Oral, Q M,W,F-HD, Elam Dutch, MD, 0.5 mcg at 03/16/19 1354 .  calcium carbonate (dosed in mg elemental calcium) suspension 500 mg of elemental calcium, 500 mg of elemental calcium, Oral, Q6H PRN, Fields, Jessy Oto, MD .  camphor-menthol [facility]) lotion 1 application, 1 application, Topical, Q6P PRN **AND** hydrOXYzine (ATARAX/VISTARIL) tablet 25 mg, 25 mg, Oral, Q8H PRN, Fields, Jessy Oto, MD .  Chlorhexidine Gluconate Cloth 2 % PADS 6 each, 6 each, Topical, Q0600, Elam Dutch, MD, 6 each at 03/17/19 [phone] .  Chlorhexidine Gluconate Cloth 2 % PADS 6 each, 6 each, Topical, Q0600, Elam Dutch, MD, 6 each at 03/15/19 [phone] .  cinacalcet (SENSIPAR) tablet 30 mg, 30 mg, Oral, Joneen Caraway, MD, 30 mg at 03/16/19 0740 .  collagenase (SANTYL) ointment 1 application, 1 application, Topical, Daily, Elam Dutch, MD, 1 application at 67/12/45 1645 .  docusate sodium (COLACE) capsule 100 mg, 100 mg, Oral, Daily, Elam Dutch, MD, 100 mg at 03/16/19 1645 .  docusate sodium (ENEMEEZ) enema 283 mg, 1 enema, Rectal, PRN, Fields,  Jessy Oto, MD .  feeding supplement (NEPRO CARB STEADY) liquid 237 mL, 237 mL, Oral, BID BM, Elam Dutch, MD, 237 mL at 03/16/19 1400 .  feeding supplement (PRO-STAT SUGAR FREE 64) liquid 30 mL, 30 mL, Oral, BID, Elam Dutch, MD, 30 mL at 03/16/19 2202 .  ferric citrate (AURYXIA) tablet 420 mg, 420 mg, Oral, TID WC, Elam Dutch, MD, 420 mg at 03/17/19 0856 .  ferric citrate (AURYXIA) tablet 420 mg, 420 mg, Oral, With snacks, Elam Dutch, MD, 420 mg at 03/17/19 0542 .  guaiFENesin-dextromethorphan (ROBITUSSIN DM) 100-10 MG/5ML syrup 15 mL, 15 mL, Oral, Q4H PRN, Elam Dutch, MD .  heparin injection 5,000 Units, 5,000 Units, Subcutaneous, Q8H, Fields, Jessy Oto, MD, 5,000 Units at 03/17/19 4970 .  hydrALAZINE (APRESOLINE) injection 5 mg, 5 mg, Intravenous, Q20 Min PRN, Fields, Charles E, MD .  labetalol (NORMODYNE) injection 10 mg, 10 mg, Intravenous, Q10 min PRN, Fields, Charles E, MD .  magnesium sulfate IVPB 2 g 50 mL, 2 g, Intravenous, Daily PRN, Fields, Charles E, MD .  metoprolol tartrate (LOPRESSOR) injection 2-5 mg, 2-5 mg, Intravenous, Q2H PRN, Elam Dutch, MD .  midodrine (PROAMATINE) tablet 10 mg, 10 mg, Oral, Q M,W,F-HD, Elam Dutch, MD, 10 mg at 03/16/19 1350 .  morphine 2 MG/ML injection 2-5 mg, 2-5 mg, Intravenous, Q1H PRN, Elam Dutch, MD, 2 mg at 03/17/19 0850 .  multivitamin (RENA-VIT) tablet 1 tablet, 1 tablet, Oral, QHS, Elam Dutch, MD, 1 tablet at 03/16/19 2201 .  ondansetron (ZOFRAN) tablet 4 mg, 4 mg, Oral, Q6H PRN **OR** ondansetron (ZOFRAN) injection 4 mg, 4 mg, Intravenous, Q6H PRN, Fields, Charles E, MD .  oxyCODONE (Oxy IR/ROXICODONE) immediate release tablet 5 mg, 5 mg, Oral, Q8H PRN, Elam Dutch, MD, 5 mg at 03/17/19 0537 .  phenol (CHLORASEPTIC) mouth spray 1 spray, 1 spray, Mouth/Throat, PRN, Fields, Charles E, MD .  potassium chloride SA (K-DUR) CR tablet 20-40 mEq, 20-40 mEq, Oral, Daily PRN, Elam Dutch, MD .  rosuvastatin (CRESTOR) tablet 10 mg, 10 mg, Oral, q1800, Elam Dutch, MD, 10 mg at 03/16/19 1757 .  sorbitol 70 % solution 30 mL, 30 mL, Oral, PRN, Fields, Jessy Oto, MD .  zolpidem [facility]) tablet 5 mg, 5 mg, Oral, QHS PRN, Elam Dutch, MD, 5 mg at 03/16/19 2201  Patients Current Diet:     Diet Order                  Diet renal/carb modified with fluid restriction Diet-HS Snack? Nothing; Fluid restriction: 1200 mL Fluid; Room service appropriate? Yes; Fluid consistency: Thin  Diet effective now               Precautions / Restrictions Precautions Precautions: Fall Restrictions Weight Bearing Restrictions: No   Has the patient had 2 or more falls or a fall with injury in the past year? No  Prior Activity Level Limited Community (1-2x/wk): using Cane and recently RW pta. Was doing sponge baths due to his left subclavian hemodialysis catheter site  Prior Functional Level Self Care: Did the patient need help bathing, dressing, using the toilet or eating? Independent  Indoor Mobility: Did the patient need assistance with walking from room to room (with or without device)? Independent  Stairs: Did the patient need assistance with internal or external stairs (with or without device)? Needed some help  Functional Cognition: Did the patient need help planning regular tasks such as shopping or remembering to take medications? [place] Devices/Equipment: Environmental consultant (specify type), Cane (specify quad or straight) Home Equipment: Cane - single point, Shower seat, Environmental consultant - 2 wheels  Prior Device Use: Indicate devices/aids used by the patient prior to current illness, exacerbation or injury? Walker  Current Functional Level Cognition  Overall Cognitive Status: Within Functional Limits for tasks assessed Orientation Level: Oriented X4    Extremity Assessment (includes Sensation/Coordination)   Upper Extremity Assessment: Overall WFL for tasks assessed, Generalized weakness  Lower Extremity Assessment: Defer to PT evaluation    ADLs  Overall ADL's : Needs assistance/impaired Eating/Feeding: Set up, Sitting Grooming:  Set up, Sitting Upper Body Bathing: Set up, Sitting, Min guard Lower Body Bathing: Moderate assistance, +2 for physical assistance, Sit to/from stand Upper Body Dressing : Min guard, Set up Lower Body Dressing: Moderate assistance, +2 for physical assistance, Sit to/from stand General ADL Comments: Pt completed bed mobility, stood 1x for just under a minute.     Mobility  Overal bed mobility: Needs Assistance Bed Mobility: Supine to Sit, Sit to Supine Supine to sit: Supervision, Min guard Sit to supine: Min guard, HOB elevated General bed mobility comments: extra time and effort. used momentum and rail to powerup. supervision-min guard for safety    Transfers  Overall transfer level: Needs assistance Equipment used: Rolling walker (2 wheeled) Transfers: Sit to/from Stand Sit to Stand: Mod assist, +2 physical assistance General transfer comment: mod Ax2 to power up from elevated bed height    Ambulation / Gait / Stairs / Wheelchair Mobility  Ambulation/Gait Gait velocity: deferred due to patient tolerance today    Posture / Balance Balance Overall balance assessment: Needs assistance Sitting-balance support: No upper extremity supported, Feet supported Sitting balance-Leahy Scale: Good Standing balance support: During functional activity, Single extremity supported Standing balance-Leahy Scale: Poor Standing balance comment: relies on UE support for balance    Special needs/care consideration BiPAP/CPAP  N/a CPM  N/a Continuous Drip IV  N/a Dialysis MWF [place]; daughter, [name], provides transportation to dialysis PTA Life Vest n/a Oxygen  N/a Special Bed  N/a Trach Size  N/a Wound Vac n/a Skin  Left subclavian hemodialysis catheter;  left BKA surgical site with dressing and ace wrap; right toe amputation site with dressing; bilateral groin surgical sites Bowel mgmt: continent LBM 8/1 Bladder mgmt: anuric Diabetic mgmt: type 2 DM Behavioral consideration n/a Chemo/radiation  N/a   Previous Home Environment  Living Arrangements: Spouse/significant other, Other (Comment)(as well as daughter, [name])  Lives With: Spouse, Daughter Available Help at Discharge: Friend(s), Available 24 hours/day Type of Home: House Home Layout: One level Home Access: (level entry or threshold only) Bathroom Shower/Tub: Industrial/product designer: Yes How Accessible: Accessible via walker Home Care Services: Yes Type of Home Care Services: Home [place] (if known): had [facility] and recently changed to [facility] after last admission Additional Comments: sponge batyhes due to left subclavian dialysis catheter  Discharge Living Setting Plans for Discharge Living Setting: Patient's home, Lives with (comment)(spouse and daughter, SHerry) Type of Home at Discharge: House Discharge Home Layout: One level Discharge Home Access: Level entry Discharge Bathroom Shower/Tub: Tub/shower unit, Curtain Discharge Bathroom Toilet: Standard Discharge Bathroom Accessibility: Yes How Accessible: Accessible via walker Does the patient have any problems obtaining your medications?: No  Social/Family/Support Systems Patient Roles: Spouse, Parent Contact Information: [name], main contact and caregiver Anticipated Caregiver: Engineer, structural, daughter also designated visitor Anticipated Ambulance person Information: [phone] Ability/Limitations of Caregiver: unemployed, no limitations Caregiver Availability: 24/7 Discharge Plan Discussed with Primary Caregiver: Yes Is Caregiver In Agreement with Plan?: Yes Does Caregiver/Family have Issues with Lodging/Transportation while Pt is in Rehab?: No  Goals/Additional  Needs Patient/Family Goal for Rehab: Mod I PT wheelchair level, supervision to min asisst OT at wheelchair level Expected length of stay: ELOS 10 to 12 days Equipment Needs: legally blind right eye Special Service Needs: Hemodialysis Mon. Wed, Fri Pt/Family Agrees to Admission and willing to participate: Yes Program Orientation Provided & Reviewed with Pt/Caregiver Including Roles  & Responsibilities: Yes  Decrease burden of Care through IP rehab admission: n/a  Possible need for  SNF placement upon discharge:  Not anticipated  Patient Condition: I have reviewed medical records from [facility], spoken with CSW, and patient and daughter. I met with patient at the bedside for inpatient rehabilitation assessment.  Patient will benefit from ongoing PT and OT, can actively participate in 3 hours of therapy a day 5 days of the week, and can make measurable gains during the admission.  Patient will also benefit from the coordinated team approach during an Inpatient Acute Rehabilitation admission.  The patient will receive intensive therapy as well as Rehabilitation physician, nursing, social worker, and care management interventions.  Due to bladder management, bowel management, safety, skin/wound care, disease management, medication administration, pain management and patient education the patient requires 24 hour a day rehabilitation nursing.  The patient is currently Mod assist with transfers and basic ADLs.  Discharge setting and therapy post discharge at home with home health is anticipated.  Patient has agreed to participate in the Acute Inpatient Rehabilitation Program and will admit today.  Preadmission Screen Completed By:  Cleatrice Burke RN MSN, 03/17/2019 10:35 AM ______________________________________________________________________   Discussed status with Dr. Naaman Plummer on  03/17/2019 at 1040 and received approval for admission today.  Admission Coordinator:  Cleatrice Burke, RN MSN time  1040 Date 03/17/2019   Assessment/Plan: Diagnosis:  Left BKA 1. Does the need for close, 24 hr/day Medical supervision in concert with the patient's rehab needs make it unreasonable for this patient to be served in a less intensive setting? Yes 2. Co-Morbidities requiring supervision/potential complications: HTN, ESRD on HD, DM 3. Due to bladder management, bowel management, safety, skin/wound care, disease management, medication administration, pain management and patient education, does the patient require 24 hr/day rehab nursing? Yes 4. Does the patient require coordinated care of a physician, rehab nurse, PT (1-2 hrs/day, 5 days/week) and OT (1-2 hrs/day, 5 days/week) to address physical and functional deficits in the context of the above medical diagnosis(es)? Yes Addressing deficits in the following areas: balance, endurance, locomotion, strength, transferring, bowel/bladder control, bathing, dressing, feeding, grooming, toileting and psychosocial support 5. Can the patient actively participate in an intensive therapy program of at least 3 hrs of therapy 5 days a week? Yes 6. The potential for patient to make measurable gains while on inpatient rehab is excellent 7. Anticipated functional outcomes upon discharge from inpatients are: modified independent PT, supervision and min assist OT, n/a SLP---wc level 8. Estimated rehab length of stay to reach the above functional goals is: 10-12 days 9. Anticipated D/C setting: Home 10. Anticipated post D/C treatments: [place] therapy 11. Overall Rehab/Functional Prognosis: excellent  MD Signature: Meredith Staggers, MD, [place] [facility] 03/17/2019         Revision History

## 2019-03-17 NOTE — Discharge Summary (Signed)
Physician Discharge Summary  Timothy Faulkner SAY:[id number] DOB: 11-14-1945 DOA: 03/12/2019  PCP: [name], MD  Admit date: 03/12/2019 Discharge date: 03/17/2019  Admitted From: Home Disposition:  Home  Discharge Condition:Stable CODE STATUS:FULL Diet recommendation: Heart Healthy  Brief/Interim Summary:  Patient is a 73 year old male with history of diabetes mellitus, peripheral vascular disease status post revascularization procedure on 7/17and atherectomy on 7/20 with subsequent 3/4 toe amputation on the L foot 7/28; HTN; HLD; ESRD on MWF HD; AICD placement; and CHF presenting with R 2nd toe wound infection.  He presented to the emergency department after his podiatrist [name] recommended for evaluation of second toe which had turned dark.  Nephrology following for dialysis.  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  Antibiotics discontinued.  He was recommended CIR by PT/OT.  He is medically stable for discharge today.  Following problems were addressed during his hospitalization:  Critical lower limb ischemia/left second toe gangrene: History of severe peripheral artery disease.  Past history as above.  Recent admission on 7/14-23 with critical limb ischemia and third/fourth toe amputation.  Status post revascularization procedure.  On aspirin and Plavix at home.  He also has wound on the lateral side of the right foot.  It is healing well with intact graft.   He Presented with black discoloration of his left second toe.  Second toe was gangrenous and there was poor wound healing of the third and fourth amputation.  Vascular surgery and podiatry were following. 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   Abx D/Ced  Debility/deconditioning: Patient evaluated by PT/OT and recommended CIR.  CIR consulted  ESRD on dialysis:   Nephrology following.  Does not appear to be volume overloaded.Undergoing  dialysis  Chronic systolic CHF: Currently compensated.  Chronic hypotension: On Midodrin.  Currently blood pressure stable.  Diabetes mellitus type II: Continue current regimen.  Monitor CBGs.  Discharge Diagnoses:  Principal Problem:   Critical lower limb ischemia Active Problems:   HYPERCHOLESTEROLEMIA, MIXED   Essential hypertension   ESRD (end stage renal disease) on dialysis (HCC)   Chronic systolic heart failure (HCC)   PAD (peripheral artery disease) (HCC)   Ulcer of right foot with fat layer exposed ([place])   Gangrene of toe of left foot [facility])    Discharge Instructions  Discharge Instructions    Diet - low sodium heart healthy   Complete by: As directed    Discharge instructions   Complete by: As directed    1) Please follow up with vascular surgery in 2 weeks.   Increase activity slowly   Complete by: As directed      Allergies as of 03/17/2019   No Known Allergies     Medication List    STOP taking these medications   cefTAZidime 1 g in dextrose 5 % 50 mL     TAKE these medications   acetaminophen 500 MG tablet Commonly known as: TYLENOL Take 500 mg by mouth every 6 (six) hours as needed for mild pain or headache.   aspirin EC 81 MG tablet Take 81 mg by mouth daily.   Auryxia 1 GM 210 MG(Fe) tablet Generic drug: ferric citrate Take 420 mg by mouth See admin instructions. Take 2 tablets (420 mg) by mouth with each meal and with each snack   calcitRIOL 0.5 MCG capsule Commonly known as: ROCALTROL Take 1 capsule (0.5 mcg total) by mouth every Monday, Wednesday, and Friday with hemodialysis.  clopidogrel 75 MG tablet Commonly known as: PLAVIX Take 1 tablet (75 mg total) by mouth daily with breakfast.   colchicine 0.6 MG tablet Take 0.6 mg by mouth daily as needed (as directed for gout flares).   collagenase ointment Commonly known as: Santyl Apply 1 application topically daily. Apply to right foot wound daily cover with dry dressing.    feeding supplement (NEPRO CARB STEADY) Liqd Take 237 mLs by mouth 2 (two) times daily between meals.   feeding supplement (PRO-STAT SUGAR FREE 64) Liqd Take 30 mLs by mouth 2 (two) times daily.   Medihoney Wound/Burn Dressing Gel Apply to affected are 3 times a week, and cover with sterile dressing.   midodrine 10 MG tablet Commonly known as: PROAMATINE Take 10 mg by mouth every Monday, Wednesday, and Friday with hemodialysis.   multivitamin Tabs tablet Take 1 tablet by mouth at bedtime.   oxyCODONE 5 MG immediate release tablet Commonly known as: Roxicodone Take 1 tablet (5 mg total) by mouth every 8 (eight) hours as needed for severe pain.   rosuvastatin 10 MG tablet Commonly known as: CRESTOR Take 1 tablet (10 mg total) by mouth daily at 6 PM.   Sensipar 30 MG tablet Generic drug: cinacalcet Take 30 mg by mouth See admin instructions. Take 30 mg by mouth every other morning      Follow-up Information    Elam Dutch, MD. Schedule an appointment as soon as possible for a visit in 2 week(s).   Specialties: Vascular Surgery, Cardiology Contact information: [place] [address] [phone]          No Known Allergies  Consultations:  Vascular surgery   Procedures/Studies: Dg Foot Complete Left  Result Date: 03/12/2019 CLINICAL DATA:  Discoloration of the left second toe. History of prior third and fourth toe amputation. EXAM: LEFT FOOT - COMPLETE 3+ VIEW COMPARISON:  Plain films the left 02/22/2019. FINDINGS: The third and fourth toes have been amputated since the prior examination. Dressing is in place. No bony destructive change or periosteal reaction. No acute abnormality. First MTP osteoarthritis noted. No soft tissue gas or radiopaque foreign body. Atherosclerosis is seen. 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 Electronically Signed   By: Inge Rise M.D.   On:  03/12/2019 14:14   Dg Foot Complete Left  Result Date: 02/22/2019 CLINICAL DATA:  Right foot wound EXAM: LEFT FOOT - COMPLETE 3+ VIEW COMPARISON:  07/13/2018 FINDINGS: Vascular calcifications. Small plantar calcaneal spur. No soft tissue emphysema. No acute fracture. Mild degenerative change at the first MTP joint. Marginal erosive changes at the head of the first proximal phalanx and the head of the first metatarsal presumably related to history of gout. IMPRESSION: 1. No acute osseous abnormality. 2. Findings felt consistent with history of gout at the first digit. Electronically Signed   By: Donavan Foil M.D.   On: 02/22/2019 20:57   Dg Foot Complete Left  Result Date: 02/22/2019 Please see detailed radiograph report in office note.  Dg Foot Complete Right  Result Date: 02/22/2019 CLINICAL DATA:  Right foot wound EXAM: RIGHT FOOT COMPLETE - 3+ VIEW COMPARISON:  01/13/2019, 09/07/2018 FINDINGS: Chronic fracture deformity of the first proximal phalanx. Ununited fracture deformity proximal shaft fourth metatarsal with possible increased bone resorption at the fracture cleft. Prior amputation fifth digit at the base of fifth metatarsal without definitive acute bony destructive change. No soft tissue emphysema. IMPRESSION: 1. Prior amputation of the  fifth digit at the level of the base of fifth metatarsal without definitive acute osseous changes of the remnant digit 2. Ununited fracture proximal shaft fourth metatarsal, seen on prior radiograph from June 2020. There may be increased osteolysis at the fracture cleft Electronically Signed   By: Donavan Foil M.D.   On: 02/22/2019 21:01   Vas [place] Burnard Bunting With/wo Tbi  Result Date: 02/24/2019 LOWER EXTREMITY DOPPLER STUDY Indications: Ulceration, and gangrene. High Risk Factors: Hypertension, hyperlipidemia, Diabetes.  Comparison Study: previous 07/14/18 Performing Technologist: Abram Sander RVS  Examination Guidelines: A complete evaluation includes at minimum,  Doppler waveform signals and systolic blood pressure reading at the level of bilateral brachial, anterior tibial, and posterior tibial arteries, when vessel segments are accessible. Bilateral testing is considered an integral part of a complete examination. Photoelectric Plethysmograph (PPG) waveforms and toe systolic pressure readings are included as required and additional duplex testing as needed. Limited examinations for reoccurring indications may be performed as noted.  ABI Findings: +--------+------------------+-----+-------------------+-----------+ Right   Rt Pressure (mmHg)IndexWaveform           Comment     +--------+------------------+-----+-------------------+-----------+ [id number]                    triphasic                      +--------+------------------+-----+-------------------+-----------+ PTA     73                0.51 dampened monophasic            +--------+------------------+-----+-------------------+-----------+ DP                                                not audible +--------+------------------+-----+-------------------+-----------+ +--------+------------------+-----+-------------------+-------------------+ Left    Lt Pressure (mmHg)IndexWaveform           Comment             +--------+------------------+-----+-------------------+-------------------+ Brachial                       triphasic          unable to obtain bp +--------+------------------+-----+-------------------+-------------------+ PTA     48                0.34 dampened monophasic                    +--------+------------------+-----+-------------------+-------------------+ DP                                                not audible         +--------+------------------+-----+-------------------+-------------------+ +-------+-----------+-----------+------------+------------+ ABI/TBIToday's ABIToday's TBIPrevious ABIPrevious TBI  +-------+-----------+-----------+------------+------------+ Right  0.51                                           +-------+-----------+-----------+------------+------------+ Left   0.34                                           +-------+-----------+-----------+------------+------------+  Summary: Right:  Resting right ankle-brachial index indicates moderate right lower extremity arterial disease. Left: Resting left ankle-brachial index indicates severe left lower extremity arterial disease.  *See table(s) above for measurements and observations.  Electronically signed by Harold Barban MD on 02/24/2019 at 1:12:58 PM.   Final        Subjective:   Discharge Exam: Vitals:   03/17/19 0520 03/17/19 0749  BP: 109/76 120/77  Pulse: 100 94  Resp: 16 17  Temp: 98.2 F (36.8 C) 98 F (36.7 C)  SpO2: 96% 100%   Vitals:   03/16/19 1647 03/16/19 2038 03/17/19 0520 03/17/19 0749  BP: 99/68 133/72 109/76 120/77  Pulse: (!) 101 85 100 94  Resp: 18 16 16 17   Temp: 98.3 F (36.8 C) 98.4 F (36.9 C) 98.2 F (36.8 C) 98 F (36.7 C)  TempSrc: Oral Oral  Oral  SpO2: 98% 99% 96% 100%  Weight:  79 kg      General: Pt is alert, awake, not in acute distress Cardiovascular: RRR, S1/S2 +, no rubs, no gallops Respiratory: CTA bilaterally, no wheezing, no rhonchi Abdominal: Soft, NT, ND, bowel sounds + Extremities: no edema, no cyanosis    The results of significant diagnostics from this hospitalization (including imaging, microbiology, ancillary and laboratory) are listed below for reference.     Microbiology: Recent Results (from the past 240 hour(s))  Aerobic/Anaerobic Culture (surgical/deep wound)     Status: None   Collection Time: 03/08/19  8:15 AM   Specimen: Soft Tissue, Other  Result Value Ref Range Status   Specimen Description FOOT LEFT  Final   Special Requests DEEP SOFT TISSUE CULTURE  Final   Gram Stain   Final    RARE WBC PRESENT, PREDOMINANTLY PMN FEW GRAM NEGATIVE  RODS    Culture   Final    MODERATE ENTEROBACTER CLOACAE NO ANAEROBES ISOLATED Performed at [facility], [place] [address], [place], [place] [address]    Report Status 03/13/2019 FINAL  Final   Organism ID, Bacteria ENTEROBACTER CLOACAE  Final      Susceptibility   Enterobacter cloacae - MIC*    CEFAZOLIN >=64 RESISTANT Resistant     CEFEPIME <=1 SENSITIVE Sensitive     CEFTAZIDIME <=1 SENSITIVE Sensitive     CEFTRIAXONE <=1 SENSITIVE Sensitive     CIPROFLOXACIN <=0.25 SENSITIVE Sensitive     GENTAMICIN <=1 SENSITIVE Sensitive     IMIPENEM <=0.25 SENSITIVE Sensitive     TRIMETH/SULFA <=20 SENSITIVE Sensitive     PIP/TAZO <=4 SENSITIVE Sensitive     * MODERATE ENTEROBACTER CLOACAE  Blood culture (routine x 2)     Status: None (Preliminary result)   Collection Time: 03/12/19  1:00 PM   Specimen: BLOOD RIGHT WRIST  Result Value Ref Range Status   Specimen Description BLOOD RIGHT WRIST  Final   Special Requests   Final    BOTTLES DRAWN AEROBIC AND ANAEROBIC Blood Culture results may not be optimal due to an inadequate volume of blood received in culture bottles   Culture   Final    NO GROWTH 4 DAYS Performed at [facility], [place] [address], [place], [place] [address]    Report Status PENDING  Incomplete  Blood culture (routine x 2)     Status: None (Preliminary result)   Collection Time: 03/12/19  2:50 PM   Specimen: BLOOD RIGHT HAND  Result Value Ref Range Status   Specimen Description BLOOD RIGHT HAND  Final   Special Requests   Final  BOTTLES DRAWN AEROBIC AND ANAEROBIC Blood Culture results may not be optimal due to an inadequate volume of blood received in culture bottles   Culture   Final    NO GROWTH 4 DAYS Performed at [facility], [place] [address], [place], [place] [address]    Report Status PENDING  Incomplete  SARS CORONAVIRUS 2 Nasal Swab Aptima Multi Swab     Status: None   Collection Time: 03/12/19  5:18 PM   Specimen: Aptima Multi Swab; Nasal  Swab  Result Value Ref Range Status   SARS Coronavirus 2 NEGATIVE NEGATIVE Final    Comment: (NOTE) SARS-CoV-2 target nucleic acids are NOT DETECTED. The SARS-CoV-2 RNA is generally detectable in upper and lower respiratory specimens during the acute phase of infection. Negative results do not preclude SARS-CoV-2 infection, do not rule out co-infections with other pathogens, and should not be used as the sole basis for treatment or other patient management decisions. Negative results must be combined with clinical observations, patient history, and epidemiological information. The expected result is Negative. Fact Sheet for Patients: SugarRoll.be Fact Sheet for Healthcare Providers: https://www.woods-mathews.com/ This test is not yet approved or cleared by the [place] FDA and  has been authorized for detection and/or diagnosis of SARS-CoV-2 by FDA under an Emergency Use Authorization (EUA). This EUA will remain  in effect (meaning this test can be used) for the duration of the COVID-19 declaration under Section 56 4(b)(1) of the Act, 21 U.S.C. section 360bbb-3(b)(1), unless the authorization is terminated or revoked sooner. Performed at [facility], [place] [address], [place], [place] [address]      Labs: BNP (last 3 results) No results for input(s): BNP in the last 8760 hours. Basic Metabolic Panel: Recent Labs  Lab 03/13/19 0543 03/14/19 0716 03/15/19 0618 03/16/19 0427 03/17/19 0554  NA 136 134* 136 134* 133*  K 3.5 3.8 4.1 4.7 3.7  CL 99 95* 97* 94* 93*  CO2 24 21* 23 21* 24  GLUCOSE 78 83 71 108* 77  BUN 34* 51* 31* 39* 20  CREATININE 6.93* 8.67* 6.15* 7.41* 4.53*  CALCIUM 9.2 9.5 9.2 9.3 8.9  PHOS  --   --   --   --  2.8   Liver Function Tests: Recent Labs  Lab 03/12/19 1216  AST 17  ALT 6  ALKPHOS 66  BILITOT 0.9  PROT 8.1  ALBUMIN 3.0*   No results for input(s): LIPASE, AMYLASE in the last 168  hours. No results for input(s): AMMONIA in the last 168 hours. CBC: Recent Labs  Lab 03/12/19 1216 03/13/19 0543 03/14/19 0716 03/15/19 0618 03/16/19 0427 03/17/19 0554  WBC 9.9 8.9 8.6 10.2 9.8 7.9  NEUTROABS 7.9*  --   --   --  7.7 5.4  HGB 11.7* 11.7* 11.5* 11.2* 10.8* 9.4*  HCT 39.2 37.7* 37.8* 37.0* 36.4* 30.7*  MCV 102.1* 98.2 98.4 100.3* 100.0 99.7  PLT 356 340 373 364 346 338   Cardiac Enzymes: No results for input(s): CKTOTAL, CKMB, CKMBINDEX, TROPONINI in the last 168 hours. BNP: Invalid input(s): POCBNP CBG: Recent Labs  Lab 03/16/19 0700 03/16/19 1124 03/16/19 1628 03/16/19 2039 03/17/19 0656  GLUCAP 98 135* 119* 127* 68*   D-Dimer No results for input(s): DDIMER in the last 72 hours. Hgb A1c No results for input(s): HGBA1C in the last 72 hours. Lipid Profile No results for input(s): CHOL, HDL, LDLCALC, TRIG, CHOLHDL, LDLDIRECT in the last 72 hours. Thyroid function studies No results for  input(s): TSH, T4TOTAL, T3FREE, THYROIDAB in the last 72 hours.  Invalid input(s): FREET3 Anemia work up No results for input(s): VITAMINB12, FOLATE, FERRITIN, TIBC, IRON, RETICCTPCT in the last 72 hours. Urinalysis    Component Value Date/Time   COLORURINE YELLOW 03/11/2013 1548   APPEARANCEUR CLOUDY (A) 03/11/2013 1548   LABSPEC 1.017 03/11/2013 1548   PHURINE 5.0 03/11/2013 1548   GLUCOSEU NEGATIVE 03/11/2013 1548   HGBUR NEGATIVE 03/11/2013 1548   [place] 03/11/2013 1548   KETONESUR NEGATIVE 03/11/2013 1548   PROTEINUR >300 (A) 03/11/2013 1548   UROBILINOGEN 1.0 03/11/2013 1548   NITRITE NEGATIVE 03/11/2013 1548   LEUKOCYTESUR SMALL (A) 03/11/2013 1548   Sepsis Labs Invalid input(s): PROCALCITONIN,  WBC,  LACTICIDVEN Microbiology Recent Results (from the past 240 hour(s))  Aerobic/Anaerobic Culture (surgical/deep wound)     Status: None   Collection Time: 03/08/19  8:15 AM   Specimen: Soft Tissue, Other  Result Value Ref Range Status    Specimen Description FOOT LEFT  Final   Special Requests DEEP SOFT TISSUE CULTURE  Final   Gram Stain   Final    RARE WBC PRESENT, PREDOMINANTLY PMN FEW GRAM NEGATIVE RODS    Culture   Final    MODERATE ENTEROBACTER CLOACAE NO ANAEROBES ISOLATED Performed at [facility], [place] [address] [place], [place] [address]    Report Status 03/13/2019 FINAL  Final   Organism ID, Bacteria ENTEROBACTER CLOACAE  Final      Susceptibility   Enterobacter cloacae - MIC*    CEFAZOLIN >=64 RESISTANT Resistant     CEFEPIME <=1 SENSITIVE Sensitive     CEFTAZIDIME <=1 SENSITIVE Sensitive     CEFTRIAXONE <=1 SENSITIVE Sensitive     CIPROFLOXACIN <=0.25 SENSITIVE Sensitive     GENTAMICIN <=1 SENSITIVE Sensitive     IMIPENEM <=0.25 SENSITIVE Sensitive     TRIMETH/SULFA <=20 SENSITIVE Sensitive     PIP/TAZO <=4 SENSITIVE Sensitive     * MODERATE ENTEROBACTER CLOACAE  Blood culture (routine x 2)     Status: None (Preliminary result)   Collection Time: 03/12/19  1:00 PM   Specimen: BLOOD RIGHT WRIST  Result Value Ref Range Status   Specimen Description BLOOD RIGHT WRIST  Final   Special Requests   Final    BOTTLES DRAWN AEROBIC AND ANAEROBIC Blood Culture results may not be optimal due to an inadequate volume of blood received in culture bottles   Culture   Final    NO GROWTH 4 DAYS Performed at [facility], [place] [address] [place], [place] [address]    Report Status PENDING  Incomplete  Blood culture (routine x 2)     Status: None (Preliminary result)   Collection Time: 03/12/19  2:50 PM   Specimen: BLOOD RIGHT HAND  Result Value Ref Range Status   Specimen Description BLOOD RIGHT HAND  Final   Special Requests   Final    BOTTLES DRAWN AEROBIC AND ANAEROBIC Blood Culture results may not be optimal due to an inadequate volume of blood received in culture bottles   Culture   Final    NO GROWTH 4 DAYS Performed at [facility], [place] [address], [place], [place] [address]     Report Status PENDING  Incomplete  SARS CORONAVIRUS 2 Nasal Swab Aptima Multi Swab     Status: None   Collection Time: 03/12/19  5:18 PM   Specimen: Aptima Multi Swab; Nasal Swab  Result Value Ref Range Status  SARS Coronavirus 2 NEGATIVE NEGATIVE Final    Comment: (NOTE) SARS-CoV-2 target nucleic acids are NOT DETECTED. The SARS-CoV-2 RNA is generally detectable in upper and lower respiratory specimens during the acute phase of infection. Negative results do not preclude SARS-CoV-2 infection, do not rule out co-infections with other pathogens, and should not be used as the sole basis for treatment or other patient management decisions. Negative results must be combined with clinical observations, patient history, and epidemiological information. The expected result is Negative. Fact Sheet for Patients: SugarRoll.be Fact Sheet for Healthcare Providers: https://www.woods-mathews.com/ This test is not yet approved or cleared by the [place] FDA and  has been authorized for detection and/or diagnosis of SARS-CoV-2 by FDA under an Emergency Use Authorization (EUA). This EUA will remain  in effect (meaning this test can be used) for the duration of the COVID-19 declaration under Section 56 4(b)(1) of the Act, 21 U.S.C. section 360bbb-3(b)(1), unless the authorization is terminated or revoked sooner. Performed at [facility], [place] [address], [place], [place] [address]     Please note: You were cared for by a hospitalist during your hospital stay. Once you are discharged, your primary care physician will handle any further medical issues. Please note that NO REFILLS for any discharge medications will be authorized once you are discharged, as it is imperative that you return to your primary care physician (or establish a relationship with a primary care physician if you do not have one) for your post hospital discharge needs so that they  can reassess your need for medications and monitor your lab values.    Time coordinating discharge: 40 minutes  SIGNED:   Shelly Coss, MD  [facility] 03/17/2019, 11:20 AM Pager [phone]  If 7PM-7AM, please contact night-coverage [email or web address] Password [email or web address]

## 2019-03-17 NOTE — Progress Notes (Addendum)
Inpatient Rehabilitation Admissions Coordinator  I met with patient at bedside to discuss goals and expectations o f an inpt rehab admit. He is in agreement and asks me to contact his daughter, [name]. I contacted [name], by phone and reviewed goals, expectations and cost of an inpt rehab admit. She prefers inpt rehab rather than SNF. I will contact Dr. Tawanna Solo for approval. Insurance has approved.  Danne Baxter, RN, MSN Rehab Admissions Coordinator [phone] 03/17/2019 8:54 AM   Dr. Tawanna Solo cleared pt for d/c to CIR today. I have notified Freda Munro, in hemodialysis of admission as well as pt's RN and SW, Lorriane Shire. I will make the arrangements to admit today.  Danne Baxter, RN, MSN Rehab Admissions Coordinator [phone] 03/17/2019 10:01 AM

## 2019-03-17 NOTE — PMR Pre-admission (Signed)
PMR Admission Coordinator Pre-Admission Assessment  Patient: Timothy Faulkner is an 73 y.o., male MRN: [id number] DOB: 19-Jun-1946 Height:   Weight: 79 kg  Insurance Information HMO: yes    PPO:      PCP:      IPA:      80/20:      OTHER: medicare advantage PRIMARY: [employer] Medicare      Policy#: [id number]      Subscriber: pt CM Name: Romie Minus      Phone#: [phone] ext none given     Fax#: [phone] Pre-Cert#: [id number] approved for 7 days with f/uJill Marshell Levan phone [phone] fax [phone]     Employer: n/a Benefits:  Phone #: [phone]   Name:  Irene Shipper. Date: 11/10/2018     Deduct: none      Out of Pocket Max: $4500 / met     Life Max: none CIR: $325 co pay per day days 1 until 5      SNF: no co pay days 1 until 20; $160 co pay per day days 21 until 49, no co pay days 50 until 100 Outpatient: $35 co pay per visit     Co-Pay: visits per medical neccesity  Home Health: 100%      Co-Pay: visits per medical neccesity DME: 80%     Co-Pay: 20% Providers: in network  SECONDARY: none       Medicaid Application Date:       Case Manager:  Disability Application Date:       Case Worker:   The "Data Collection Information Summary" for patients in Inpatient Rehabilitation Facilities with attached "Privacy Act [place] Records" was provided and verbally reviewed with: Patient and Family  Emergency Contact Information Contact Information    Name Relation Home Work Mobile   [place] Spouse [phone]     Birdie Sons Daughter [phone]  [phone]   Ameen, Mostafa [phone]        Current Medical History  Patient Admitting Diagnosis: BKA  History of Present Illness: 73 year old male with medical history significant for DM,ESRD on dialysis, CHF, gout, HTN, macular degeneration, pacemaker and PNA . Presented on 03/12/2019 with history of peripheral vascular occlusive diease. Recent revascularization procedure/angioplasty of his left tibioperoneal  trunk and posterior tibial artery on 7/17 and on 7/20 underwent angioplasty of the right tibioperoneal trunk and posterior tibial artery. On 03/08/2019 underwent amputation of his left third and fourth toes.  Presented to ED on 03/12/2019 with poorly healing amputations . 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 Post operatively pain management. 03/08/2019 enterobacter would culture placed  on Zosyn and Vanc. Antibiotics d/c'd per primary MD team 03/16/2019.   Nephrology consulted for ongoing dialysis management. Dialysis MWF. Will need EDW lowered due to BKA. Midodrine used for BP support. Anemia Hgb 9.4 on 8/6 postoperatively. Will monitor.   Diabetes with monitoring of CBGS. Heparin SQ for DVT prophylaxis.  Patient's medical record from [facility] has been reviewed by the rehabilitation admission coordinator and physician.  Past Medical History  Past Medical History:  Diagnosis Date  . Anemia   . Arthritis    Gout  . Automatic implantable cardioverter-defibrillator in situ    [employer]  . CHF   . ESRD on dialysis [facility])    [name] 03/11/2013 (03/11/2013) dialysis M/W/F  . Gangrene ([place])    left foot  . GERD (gastroesophageal reflux disease)   . Gout    "once a year"  . Heart murmur   .  HYPERCHOLESTEROLEMIA, MIXED   . HYPERTENSION   . Macular degeneration   . Osteomyelitis ([place])    right foot  . 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 Pacemaker   . Peripheral arterial disease (HCC)    left fifth toe ulcer, healing  . Pneumonia   . Shortness of breath   . 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 Wears dentures   . Wears glasses     Family History   family history includes CAD in his father; Heart disease in his father.  Prior Rehab/Hospitalizations Has the patient had prior rehab or hospitalizations prior to admission? Yes  Has the patient had major surgery during 100 days prior to admission? Yes   Current  Medications  Current Facility-Administered Medications:  .  acetaminophen (TYLENOL) tablet 650 mg, 650 mg, Oral, Q6H PRN, 650 mg at 03/12/19 2225 **OR** acetaminophen (TYLENOL) suppository 650 mg, 650 mg, Rectal, Q6H PRN, Elam Dutch, MD .  aspirin EC tablet 81 mg, 81 mg, Oral, Daily, Elam Dutch, MD, 81 mg at 03/16/19 1645 .  calcitRIOL (ROCALTROL) capsule 0.5 mcg, 0.5 mcg, Oral, Q M,W,F-HD, Elam Dutch, MD, 0.5 mcg at 03/16/19 1354 .  calcium carbonate (dosed in mg elemental calcium) suspension 500 mg of elemental calcium, 500 mg of elemental calcium, Oral, Q6H PRN, Fields, Jessy Oto, MD .  camphor-menthol [facility]) lotion 1 application, 1 application, Topical, I3G PRN **AND** hydrOXYzine (ATARAX/VISTARIL) tablet 25 mg, 25 mg, Oral, Q8H PRN, Fields, Jessy Oto, MD .  Chlorhexidine Gluconate Cloth 2 % PADS 6 each, 6 each, Topical, Q0600, Elam Dutch, MD, 6 each at 03/17/19 [phone] .  Chlorhexidine Gluconate Cloth 2 % PADS 6 each, 6 each, Topical, Q0600, Elam Dutch, MD, 6 each at 03/15/19 [phone] .  cinacalcet (SENSIPAR) tablet 30 mg, 30 mg, Oral, Joneen Caraway, MD, 30 mg at 03/16/19 0740 .  collagenase (SANTYL) ointment 1 application, 1 application, Topical, Daily, Elam Dutch, MD, 1 application at 58/30/94 1645 .  docusate sodium (COLACE) capsule 100 mg, 100 mg, Oral, Daily, Elam Dutch, MD, 100 mg at 03/16/19 1645 .  docusate sodium (ENEMEEZ) enema 283 mg, 1 enema, Rectal, PRN, Fields, Jessy Oto, MD .  feeding supplement (NEPRO CARB STEADY) liquid 237 mL, 237 mL, Oral, BID BM, Elam Dutch, MD, 237 mL at 03/16/19 1400 .  feeding supplement (PRO-STAT SUGAR FREE 64) liquid 30 mL, 30 mL, Oral, BID, Elam Dutch, MD, 30 mL at 03/16/19 2202 .  ferric citrate (AURYXIA) tablet 420 mg, 420 mg, Oral, TID WC, Elam Dutch, MD, 420 mg at 03/17/19 0856 .  ferric citrate (AURYXIA) tablet 420 mg, 420 mg, Oral, With snacks, Elam Dutch, MD, 420  mg at 03/17/19 0542 .  guaiFENesin-dextromethorphan (ROBITUSSIN DM) 100-10 MG/5ML syrup 15 mL, 15 mL, Oral, Q4H PRN, Fields, Charles E, MD .  heparin injection 5,000 Units, 5,000 Units, Subcutaneous, Q8H, Fields, Jessy Oto, MD, 5,000 Units at 03/17/19 0768 .  hydrALAZINE (APRESOLINE) injection 5 mg, 5 mg, Intravenous, Q20 Min PRN, Fields, Charles E, MD .  labetalol (NORMODYNE) injection 10 mg, 10 mg, Intravenous, Q10 min PRN, Fields, Charles E, MD .  magnesium sulfate IVPB 2 g 50 mL, 2 g, Intravenous, Daily PRN, Fields, Charles E, MD .  metoprolol tartrate (LOPRESSOR) injection 2-5 mg, 2-5 mg, Intravenous, Q2H PRN, Fields, Charles E, MD .  midodrine (PROAMATINE) tablet 10 mg, 10 mg,  Oral, Q M,W,F-HD, Elam Dutch, MD, 10 mg at 03/16/19 1350 .  morphine 2 MG/ML injection 2-5 mg, 2-5 mg, Intravenous, Q1H PRN, Elam Dutch, MD, 2 mg at 03/17/19 0850 .  multivitamin (RENA-VIT) tablet 1 tablet, 1 tablet, Oral, QHS, Elam Dutch, MD, 1 tablet at 03/16/19 2201 .  ondansetron (ZOFRAN) tablet 4 mg, 4 mg, Oral, Q6H PRN **OR** ondansetron (ZOFRAN) injection 4 mg, 4 mg, Intravenous, Q6H PRN, Fields, Charles E, MD .  oxyCODONE (Oxy IR/ROXICODONE) immediate release tablet 5 mg, 5 mg, Oral, Q8H PRN, Elam Dutch, MD, 5 mg at 03/17/19 0537 .  phenol (CHLORASEPTIC) mouth spray 1 spray, 1 spray, Mouth/Throat, PRN, Fields, Charles E, MD .  potassium chloride SA (K-DUR) CR tablet 20-40 mEq, 20-40 mEq, Oral, Daily PRN, Elam Dutch, MD .  rosuvastatin (CRESTOR) tablet 10 mg, 10 mg, Oral, q1800, Elam Dutch, MD, 10 mg at 03/16/19 1757 .  sorbitol 70 % solution 30 mL, 30 mL, Oral, PRN, Fields, Jessy Oto, MD .  zolpidem [facility]) tablet 5 mg, 5 mg, Oral, QHS PRN, Elam Dutch, MD, 5 mg at 03/16/19 2201  Patients Current Diet:  Diet Order            Diet renal/carb modified with fluid restriction Diet-HS Snack? Nothing; Fluid restriction: 1200 mL Fluid; Room service appropriate? Yes;  Fluid consistency: Thin  Diet effective now              Precautions / Restrictions Precautions Precautions: Fall Restrictions Weight Bearing Restrictions: No   Has the patient had 2 or more falls or a fall with injury in the past year? No  Prior Activity Level Limited Community (1-2x/wk): using Cane and recently RW pta. Was doing sponge baths due to his left subclavian hemodialysis catheter site  Prior Functional Level Self Care: Did the patient need help bathing, dressing, using the toilet or eating? Independent  Indoor Mobility: Did the patient need assistance with walking from room to room (with or without device)? Independent  Stairs: Did the patient need assistance with internal or external stairs (with or without device)? Needed some help  Functional Cognition: Did the patient need help planning regular tasks such as shopping or remembering to take medications? [place] / [place] Devices/Equipment: Environmental consultant (specify type), Cane (specify quad or straight) Home Equipment: Cane - single point, Shower seat, Environmental consultant - 2 wheels  Prior Device Use: Indicate devices/aids used by the patient prior to current illness, exacerbation or injury? Walker  Current Functional Level Cognition  Overall Cognitive Status: Within Functional Limits for tasks assessed Orientation Level: Oriented X4    Extremity Assessment (includes Sensation/Coordination)  Upper Extremity Assessment: Overall WFL for tasks assessed, Generalized weakness  Lower Extremity Assessment: Defer to PT evaluation    ADLs  Overall ADL's : Needs assistance/impaired Eating/Feeding: Set up, Sitting Grooming: Set up, Sitting Upper Body Bathing: Set up, Sitting, Min guard Lower Body Bathing: Moderate assistance, +2 for physical assistance, Sit to/from stand Upper Body Dressing : Min guard, Set up Lower Body Dressing: Moderate assistance, +2 for physical assistance, Sit to/from  stand General ADL Comments: Pt completed bed mobility, stood 1x for just under a minute.     Mobility  Overal bed mobility: Needs Assistance Bed Mobility: Supine to Sit, Sit to Supine Supine to sit: Supervision, Min guard Sit to supine: Min guard, HOB elevated General bed mobility comments: extra time and effort. used momentum and rail to powerup. supervision-min guard  for safety    Transfers  Overall transfer level: Needs assistance Equipment used: Rolling walker (2 wheeled) Transfers: Sit to/from Stand Sit to Stand: Mod assist, +2 physical assistance General transfer comment: mod Ax2 to power up from elevated bed height    Ambulation / Gait / Stairs / Wheelchair Mobility  Ambulation/Gait Gait velocity: deferred due to patient tolerance today    Posture / Balance Balance Overall balance assessment: Needs assistance Sitting-balance support: No upper extremity supported, Feet supported Sitting balance-Leahy Scale: Good Standing balance support: During functional activity, Single extremity supported Standing balance-Leahy Scale: Poor Standing balance comment: relies on UE support for balance    Special needs/care consideration BiPAP/CPAP  N/a CPM  N/a Continuous Drip IV  N/a Dialysis MWF [place]; daughter, [name], provides transportation to dialysis PTA Life Vest n/a Oxygen  N/a Special Bed  N/a Trach Size  N/a Wound Vac n/a Skin  Left subclavian hemodialysis catheter; left BKA surgical site with dressing and ace wrap; right toe amputation site with dressing; bilateral groin surgical sites Bowel mgmt: continent LBM 8/1 Bladder mgmt: anuric Diabetic mgmt: type 2 DM Behavioral consideration n/a Chemo/radiation  N/a   Previous Home Environment  Living Arrangements: Spouse/significant other, Other (Comment)(as well as daughter, [name])  Lives With: Spouse, Daughter Available Help at Discharge: Friend(s), Available 24 hours/day Type of Home: House Home Layout: One  level Home Access: (level entry or threshold only) Bathroom Shower/Tub: Industrial/product designer: Yes How Accessible: Accessible via walker Home Care Services: Yes Type of Home Care Services: Home [place] (if known): had [facility] and recently changed to [facility] after last admission Additional Comments: sponge batyhes due to left subclavian dialysis catheter  Discharge Living Setting Plans for Discharge Living Setting: Patient's home, Lives with (comment)(spouse and daughter, SHerry) Type of Home at Discharge: House Discharge Home Layout: One level Discharge Home Access: Level entry Discharge Bathroom Shower/Tub: Tub/shower unit, Curtain Discharge Bathroom Toilet: Standard Discharge Bathroom Accessibility: Yes How Accessible: Accessible via walker Does the patient have any problems obtaining your medications?: No  Social/Family/Support Systems Patient Roles: Spouse, Parent Contact Information: [name], main contact and caregiver Anticipated Caregiver: Engineer, structural, daughter also designated visitor Anticipated Ambulance person Information: [phone] Ability/Limitations of Caregiver: unemployed, no limitations Caregiver Availability: 24/7 Discharge Plan Discussed with Primary Caregiver: Yes Is Caregiver In Agreement with Plan?: Yes Does Caregiver/Family have Issues with Lodging/Transportation while Pt is in Rehab?: No  Goals/Additional Needs Patient/Family Goal for Rehab: Mod I PT wheelchair level, supervision to min asisst OT at wheelchair level Expected length of stay: ELOS 10 to 12 days Equipment Needs: legally blind right eye Special Service Needs: Hemodialysis Mon. Wed, Fri Pt/Family Agrees to Admission and willing to participate: Yes Program Orientation Provided & Reviewed with Pt/Caregiver Including Roles  & Responsibilities: Yes  Decrease burden of Care through IP rehab admission: n/a  Possible need for SNF placement upon  discharge:  Not anticipated  Patient Condition: I have reviewed medical records from [facility], spoken with CSW, and patient and daughter. I met with patient at the bedside for inpatient rehabilitation assessment.  Patient will benefit from ongoing PT and OT, can actively participate in 3 hours of therapy a day 5 days of the week, and can make measurable gains during the admission.  Patient will also benefit from the coordinated team approach during an Inpatient Acute Rehabilitation admission.  The patient will receive intensive therapy as well as Rehabilitation physician, nursing, social worker, and care management interventions.  Due to bladder management, bowel management, safety, skin/wound care, disease management, medication administration, pain management and patient education the patient requires 24 hour a day rehabilitation nursing.  The patient is currently Mod assist with transfers and basic ADLs.  Discharge setting and therapy post discharge at home with home health is anticipated.  Patient has agreed to participate in the Acute Inpatient Rehabilitation Program and will admit today.  Preadmission Screen Completed By:  Cleatrice Burke RN MSN, 03/17/2019 10:35 AM ______________________________________________________________________   Discussed status with Dr. Naaman Plummer on  03/17/2019 at 1040 and received approval for admission today.  Admission Coordinator:  Cleatrice Burke, RN MSN time  1040 Date 03/17/2019   Assessment/Plan: Diagnosis:  Left BKA 1. Does the need for close, 24 hr/day Medical supervision in concert with the patient's rehab needs make it unreasonable for this patient to be served in a less intensive setting? Yes 2. Co-Morbidities requiring supervision/potential complications: HTN, ESRD on HD, DM 3. Due to bladder management, bowel management, safety, skin/wound care, disease management, medication administration, pain management and patient education, does  the patient require 24 hr/day rehab nursing? Yes 4. Does the patient require coordinated care of a physician, rehab nurse, PT (1-2 hrs/day, 5 days/week) and OT (1-2 hrs/day, 5 days/week) to address physical and functional deficits in the context of the above medical diagnosis(es)? Yes Addressing deficits in the following areas: balance, endurance, locomotion, strength, transferring, bowel/bladder control, bathing, dressing, feeding, grooming, toileting and psychosocial support 5. Can the patient actively participate in an intensive therapy program of at least 3 hrs of therapy 5 days a week? Yes 6. The potential for patient to make measurable gains while on inpatient rehab is excellent 7. Anticipated functional outcomes upon discharge from inpatients are: modified independent PT, supervision and min assist OT, n/a SLP---wc level 8. Estimated rehab length of stay to reach the above functional goals is: 10-12 days 9. Anticipated D/C setting: Home 10. Anticipated post D/C treatments: [place] therapy 11. Overall Rehab/Functional Prognosis: excellent  MD Signature: Meredith Staggers, MD, [place] [facility] 03/17/2019

## 2019-03-17 NOTE — Progress Notes (Signed)
Report called and given to RN on 47M and patient transported in bed along with belongings.

## 2019-03-17 NOTE — Progress Notes (Signed)
Physical Therapy Treatment Patient Details Name: Timothy Faulkner MRN: [id number] DOB: 1946-06-04 Today's Date: 03/17/2019    History of Present Illness 73 y.o. 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     PT Comments    Patient with mild lethargy this AM, moving slower than yesterday, c/o of pain in residual limb and buttocks, no skin breakdown noted. Worked on bed mobility, standing EOB, and initiating lateral mobility with heel to toe pattern. Per chart patient to d/c to CIR today, agree with this plan.      Follow Up Recommendations  CIR     Equipment Recommendations  Wheelchair (measurements PT)    Recommendations for Other Services       Precautions / Restrictions Precautions Precautions: Fall Restrictions Weight Bearing Restrictions: No    Mobility  Bed Mobility Overal bed mobility: Needs Assistance Bed Mobility: Supine to Sit;Sit to Supine     Supine to sit: Supervision;Min guard Sit to supine: Min guard;HOB elevated   General bed mobility comments: extra time and effort. used momentum and rail to powerup. supervision-min guard for safety  Transfers Overall transfer level: Needs assistance Equipment used: Rolling walker (2 wheeled) Transfers: Sit to/from Stand Sit to Stand: Mod assist;+2 physical assistance         General transfer comment: mod Ax2 to power up from elevated bed height x4 times, preamture sit with attempt to heel toe pivot   Ambulation/Gait                 Stairs             Wheelchair Mobility    Modified Rankin (Stroke Patients Only)       Balance Overall balance assessment: Needs assistance Sitting-balance support: No upper extremity supported;Feet supported Sitting balance-Leahy Scale: Good     Standing balance support: During functional activity;Single extremity supported Standing balance-Leahy Scale: Poor Standing balance comment:  relies on UE support for balance                            Cognition Arousal/Alertness: Awake/alert Behavior During Therapy: WFL for tasks assessed/performed Overall Cognitive Status: Within Functional Limits for tasks assessed                                        Exercises      General Comments        Pertinent Vitals/Pain Faces Pain Scale: Hurts even more Pain Location: L surgical site Pain Descriptors / Indicators: Guarding;Grimacing    Home Living   Living Arrangements: Spouse/significant other;Other (Comment)(as well as daughter, [name])     Home Access: (level entry or threshold only)       Additional Comments: sponge batyhes due to left subclavian dialysis catheter    Prior Function            PT Goals (current goals can now be found in the care plan section) Acute Rehab PT Goals Patient Stated Goal: to walk again, be outdoor more PT Goal Formulation: With patient Time For Goal Achievement: 04/06/19 Potential to Achieve Goals: Good Progress towards PT goals: Progressing toward goals    Frequency    Min 3X/week      PT Plan Current plan remains appropriate    Co-evaluation  AM-PAC PT "6 Clicks" Mobility   Outcome Measure  Help needed turning from your back to your side while in a flat bed without using bedrails?: None Help needed moving from lying on your back to sitting on the side of a flat bed without using bedrails?: A Little Help needed moving to and from a bed to a chair (including a wheelchair)?: A Little Help needed standing up from a chair using your arms (e.g., wheelchair or bedside chair)?: A Little Help needed to walk in hospital room?: A Little Help needed climbing 3-5 steps with a railing? : A Little 6 Click Score: 19    End of Session Equipment Utilized During Treatment: Gait belt Activity Tolerance: Patient tolerated treatment well Patient left: with call bell/phone within  reach;in bed;with nursing/sitter in room Nurse Communication: Mobility status PT Visit Diagnosis: Muscle weakness (generalized) (M62.81)     Time: 4503-8882 PT Time Calculation (min) (ACUTE ONLY): 35 min  Charges:  $Therapeutic Activity: 23-37 mins                     Reinaldo Berber, PT, DPT Acute Rehabilitation Services Pager: [phone] Office: [phone]    Reinaldo Berber 03/17/2019, 10:48 AM

## 2019-03-17 NOTE — Progress Notes (Signed)
  Subjective:  Patient ID: Timothy Faulkner, male    DOB: August 08, 1946,  MRN: [id number]  Seen in IPR. Now s/p BKA. Having pain in the L BKA, but improving. Right foot doing well, no pain.  Slight transient confusion but  Objective:   Vitals:   03/17/19 1550 03/17/19 1946  BP: 114/65 102/60  Pulse: (!) 105 96  Resp: 18 18  Temp: 98.8 F (37.1 C) 98 F (36.7 C)  SpO2: 100% 100%   Right foot wound healing well, intact graft. No warmth, erythema, signs of infection.  Left BKA dressing intact. Assessment & Plan:  Patient was evaluated and treated and all questions answered.  Right foot wound -Healing well. Wound graft rehydrated with lubricating jelly. -Will re-eval wound every few days for wound graft removal. Only to be removed by DPM unless directed otherwise. -WBAT in surgical shoe -Continue to work on strengthening for LLE BKA. -Will continue to follow.  [name], DPM  Accessible via secure chat for questions or concerns.

## 2019-03-17 NOTE — Progress Notes (Signed)
[facility]  Subjective  - still with pain   Objective 120/77 94 98 F (36.7 C) (Oral) 17 100%  Intake/Output Summary (Last 24 hours) at 03/17/2019 0826 Last data filed at 03/17/2019 0818 Gross per 24 hour  Intake 720 ml  Output 0 ml  Net 720 ml   Able to straighten left leg but significant effort Incision clean and dry looks good so far  Assessment/Planning: S/p lefT BKA still with some pain control issues Rehab eval in progress   Ruta Hinds 03/17/2019 8:26 AM --  Laboratory Lab Results: Recent Labs    03/16/19 0427 03/17/19 0554  WBC 9.8 7.9  HGB 10.8* 9.4*  HCT 36.4* 30.7*  PLT 346 338   BMET Recent Labs    03/16/19 0427 03/17/19 0554  NA 134* 133*  K 4.7 3.7  CL 94* 93*  CO2 21* 24  GLUCOSE 108* 77  BUN 39* 20  CREATININE 7.41* 4.53*  CALCIUM 9.3 8.9    COAG Lab Results  Component Value Date   INR 1.2 03/15/2019   INR 1.2 03/12/2019   INR 1.24 03/12/2013   No results found for: PTT

## 2019-03-17 NOTE — H&P (Signed)
Physical Medicine and Rehabilitation Admission H&P    Chief Complaint  Patient presents with  . L-BKA with functional decline.     HPI:  Timothy Faulkner is a 73 year old male with history of HTN, NICM s/p AICD, ESRD-HD MWF, PVD with multiple bilateral foot wounds s/p recent revascularization and atherectomy with left 3rd and 4th toe amputations with poor healing and was admitted on 03/12/19 with progressive ischemic changes. He was started on IV antibiotics in hopes of limb salvage--wound cultures positive for enterobacter. 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 One[place] Alar. Postop with acute on chronic hypotension as well as ABLA.  Therapy evaluations completed revealing functional decline. CIR recommended for follow up therapy.    Said pain was well controlled at rest, however, as soon as lightly touched L BKA wrapping, screamed out loud. Then admitted, probably needed pain meds to tolerate dressing change. Refused to turn over or let me undress L BKA at all. Let me see wound on R foot.  Review of Systems  Constitutional: Negative for chills and fever.       Pt c/o his "nuts" hurt- said readjustment wasn't helpful, but would try- said better after moved in bed.  HENT: Negative for hearing loss and tinnitus.   Eyes: Positive for discharge (says eyes always dripping; also blurry/legally blind R eye).       Blind in right eye and limited vision in left eye  Respiratory: Negative for cough and shortness of breath.   Cardiovascular: Negative for chest pain and palpitations.  Gastrointestinal: Negative for abdominal pain, constipation, heartburn and nausea.  Genitourinary:       Pt anuric- no urine produced per pt-   Musculoskeletal: Negative for myalgias.  Skin: Negative for rash.  Neurological: Positive for weakness. Negative for dizziness and headaches.       Pt thought window was a door- also thought spelled street name and yelled  at family member on phone when they respelled what he MISSPELLED 3x.  Psychiatric/Behavioral: The patient has insomnia (didn't sleep well).   All other systems reviewed and are negative.     Past Medical History:  Diagnosis Date  . Anemia   . Arthritis    Gout  . Automatic implantable cardioverter-defibrillator in situ    [employer]  . CHF   . ESRD on dialysis [facility])    [name] 03/11/2013 (03/11/2013) dialysis M/W/F  . Gangrene ([place])    left foot  . GERD (gastroesophageal reflux disease)   . Gout    "once a year"  . Heart murmur   . HYPERCHOLESTEROLEMIA, MIXED   . HYPERTENSION   . Macular degeneration   . Osteomyelitis ([place])    right foot  . 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 Pacemaker   . Peripheral arterial disease (HCC)    left fifth toe ulcer, healing  . Pneumonia   . Shortness of breath   . 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 Wears dentures   . Wears glasses     Past Surgical History:  Procedure Laterality Date  . A/V FISTULAGRAM Left 07/20/2018   Procedure: A/V FISTULAGRAM;  Surgeon: [name], MD;  Location: [place] [facility];  Service: Cardiovascular;  Laterality: Left;  . ABDOMINAL AORTOGRAM N/A 02/25/2019   Procedure: ABDOMINAL AORTOGRAM;  Surgeon: Elam Dutch, MD;  Location: [place] [facility];  Service: Cardiovascular;  Laterality: N/A;  . ABDOMINAL AORTOGRAM W/LOWER EXTREMITY Bilateral 07/20/2018   Procedure: ABDOMINAL AORTOGRAM W/LOWER EXTREMITY;  Surgeon: [name], MD;  Location: [place] [facility];  Service: Cardiovascular;  Laterality: Bilateral;  . AMPUTATION Right 09/07/2018   Procedure: Right fifth metatarsectomy;  Surgeon: [name], DPM;  Location: [place];  Service: Podiatry;  Laterality: Right;  . AMPUTATION Left 03/08/2019   Procedure: AMPUTATION  3RD AND 4TH TOES LEFT FOOT;  Surgeon: [name], DPM;  Location: [place];  Service: Podiatry;  Laterality: Left;   . AMPUTATION Left 03/15/2019   Procedure: AMPUTATION BELOW KNEE;  Surgeon: Elam Dutch, MD;  Location: [facility] OR;  Service: Vascular;  Laterality: Left;  . AV FISTULA PLACEMENT Right 12/13/2012   Procedure: ARTERIOVENOUS (AV) FISTULA CREATION;  Surgeon: [name], MD;  Location: [place];  Service: Vascular;  Laterality: Right;  Ultrasound guided  . AV FISTULA PLACEMENT Left 05/07/2016   Procedure: LEFT RADIOCEPHALIC ARTERIOVENOUS (AV) FISTULA CREATION;  Surgeon: [name], MD;  Location: [place];  Service: Vascular;  Laterality: Left;  . BASCILIC VEIN TRANSPOSITION Right 03/26/2016   Procedure: RIGHT BASILIC VEIN TRANSPOSITION;  Surgeon: [name], MD;  Location: [place];  Service: Vascular;  Laterality: Right;  . CARDIAC CATHETERIZATION    . CARDIAC DEFIBRILLATOR PLACEMENT     Chemical engineer  . EYE SURGERY Bilateral    Cataract  . FISTULOGRAM Left 04/22/2018   Procedure: FISTULOGRAM UPPER EXTREMITY;  Surgeon: [name], MD;  Location: [place];  Service: Vascular;  Laterality: Left;  . I&D EXTREMITY Right 07/15/2018   Procedure: IRRIGATION AND DEBRIDEMENT RIGHT FOOT;  Surgeon: [name], DPM;  Location: [place];  Service: Podiatry;  Laterality: Right;  . INCISION AND DRAINAGE ABSCESS / HEMATOMA OF BURSA / KNEE / THIGH Left 2012   "knee" (03/11/2013)  . INSERT / REPLACE / REMOVE PACEMAKER    . INSERTION OF DIALYSIS CATHETER Left 04/22/2018   Procedure: INSERTION OF DIALYSIS CATHETER;  Surgeon: [name], MD;  Location: [place];  Service: Vascular;  Laterality: Left;  . IR FLUORO GUIDE CV LINE LEFT  07/15/2018  . IR PTA VENOUS EXCEPT DIALYSIS CIRCUIT  07/15/2018  . LOWER EXTREMITY ANGIOGRAPHY Right 07/21/2018   Procedure: LOWER EXTREMITY ANGIOGRAPHY;  Surgeon: [name], MD;  Location: [place] [facility];  Service: Cardiovascular;  Laterality: Right;  . LOWER EXTREMITY ANGIOGRAPHY Bilateral 02/25/2019   Procedure: Lower Extremity Angiography;  Surgeon: Elam Dutch, MD;  Location: [place] [facility];  Service: Cardiovascular;  Laterality: Bilateral;  . METATARSAL HEAD EXCISION Right 07/15/2018   Procedure: METATARSAL RESECTION;  Surgeon: [name], DPM;  Location: [place];  Service: Podiatry;  Laterality: Right;  . MULTIPLE TOOTH EXTRACTIONS    . PERIPHERAL VASCULAR ATHERECTOMY Right 02/28/2019   Procedure: PERIPHERAL VASCULAR ATHERECTOMY;  Surgeon: Waynetta Sandy, MD;  Location: [place] [facility];  Service: Cardiovascular;  Laterality: Right;  right tp trunk   . PERIPHERAL VASCULAR BALLOON ANGIOPLASTY Left 02/25/2019   Procedure: PERIPHERAL VASCULAR BALLOON ANGIOPLASTY;  Surgeon: Elam Dutch, MD;  Location: [place] [facility];  Service: Cardiovascular;  Laterality: Left;  tibial peroneal trunk and PT  . PERIPHERAL VASCULAR INTERVENTION Right 07/21/2018   Procedure: PERIPHERAL VASCULAR INTERVENTION;  Surgeon: [name], MD;  Location: [place] [facility];  Service: Cardiovascular;  Laterality: Right;  peroneal stents  . REVISON OF ARTERIOVENOUS FISTULA Right 10/30/2015  Procedure: Plication OF Right Arm ARTERIOVENOUS FISTULA;  Surgeon: Elam Dutch, MD;  Location: [facility] OR;  Service: Vascular;  Laterality: Right;  . REVISON OF ARTERIOVENOUS FISTULA Left 04/22/2018   Procedure: REVISION OF RADIOCEPHALIC ARTERIOVENOUS FISTULA;  Surgeon: [name], MD;  Location: [place];  Service: Vascular;  Laterality: Left;  . SHUNTOGRAM N/A 05/31/2013   Procedure: Earney Mallet;  Surgeon: [name], MD;  Location: [facility] CATH LAB;  Service: Cardiovascular;  Laterality: N/A;  . UPPER EXTREMITY VENOGRAPHY Right 07/23/2018   Procedure: UPPER EXTREMITY VENOGRAPHY;  Surgeon: [name], MD;  Location: [place] [facility];  Service: Cardiovascular;  Laterality: Right;  . WOUND DEBRIDEMENT Right 07/17/2018   Procedure: Wound Debridement; Closure Filleted toe flap Right Foot;  Surgeon: [name], DPM;  Location: [place];  Service: Podiatry;  Laterality: Right;  . WOUND DEBRIDEMENT Right 09/07/2018   Procedure: Debridement Right Foot Wound, application of wound vac;  Surgeon: [name], DPM;  Location: [place];  Service: Podiatry;  Laterality: Right;  . WOUND DEBRIDEMENT Right 03/08/2019   Procedure: DEBRIDEMENT WOUND RIGHT FOOT;  Surgeon: [name], DPM;  Location: [place];  Service: Podiatry;  Laterality: Right;    Family History  Problem Relation Age of Onset  . Heart disease Father   . CAD Father   patient reports nothing else runs in family.    Social History:  reports that he quit smoking about 26 years ago. His smoking use included cigarettes. He has a 22.50 pack-year smoking history. He has never used smokeless tobacco. He reports previous alcohol use. He reports previous drug use. Drug: Marijuana.  Patient lives in 1 story house with spouse and daughter, [name]- no Steps to enter; w/c accessible, per pt, but was using cane and RW in the few months prior to admission.  Allergies: No Known Allergies    Medications Prior to Admission  Medication Sig Dispense Refill  . acetaminophen (TYLENOL) 500 MG tablet Take 500 mg by mouth every 6 (six) hours as needed for mild pain or headache.    Marland Kitchen aspirin EC 81 MG tablet Take 81 mg by mouth daily.    Lorin Picket 1 GM 210 MG(Fe) tablet Take 420 mg by mouth See admin instructions. Take 2 tablets (420 mg) by mouth with each meal and with each snack    . calcitRIOL (ROCALTROL) 0.5 MCG capsule Take 1 capsule (0.5 mcg total) by mouth every Monday, Wednesday, and Friday with hemodialysis.    . cefTAZidime 1 g in dextrose 5 % 50 mL Inject 1 g into the vein every dialysis for up to 6 doses. Administer after dialysis or as per nephrologist. 6 g 0  . clopidogrel (PLAVIX) 75 MG tablet Take 1 tablet (75 mg total) by mouth daily with breakfast. 30 tablet 0  . colchicine 0.6 MG tablet Take 0.6 mg by mouth daily as needed (as directed for gout flares).   1  .  collagenase (SANTYL) ointment Apply 1 application topically daily. Apply to right foot wound daily cover with dry dressing. 15 g 5  . midodrine (PROAMATINE) 10 MG tablet Take 10 mg by mouth every Monday, Wednesday, and Friday with hemodialysis.   6  . oxyCODONE (ROXICODONE) 5 MG immediate release tablet Take 1 tablet (5 mg total) by mouth every 8 (eight) hours as needed for severe pain. 20 tablet 0  . SENSIPAR 30 MG tablet Take 30 mg by mouth See admin instructions. Take 30 mg by mouth  every other morning    . Amino Acids-Protein Hydrolys (FEEDING SUPPLEMENT, PRO-STAT SUGAR FREE 64,) LIQD Take 30 mLs by mouth 2 (two) times daily. (Patient not taking: Reported on 03/12/2019) 887 mL 0  . multivitamin (RENA-VIT) TABS tablet Take 1 tablet by mouth at bedtime. (Patient not taking: Reported on 03/12/2019) 30 tablet 0  . Nutritional Supplements (FEEDING SUPPLEMENT, NEPRO CARB STEADY,) LIQD Take 237 mLs by mouth 2 (two) times daily between meals. 237 mL 12  . rosuvastatin (CRESTOR) 10 MG tablet Take 1 tablet (10 mg total) by mouth daily at 6 PM. (Patient not taking: Reported on 03/12/2019) 30 tablet 0  . Wound Dressings (MEDIHONEY WOUND/BURN DRESSING) GEL Apply to affected are 3 times a week, and cover with sterile dressing. 15 mL 5    Drug Regimen Review  Drug regimen was reviewed and remains appropriate with no significant issues identified Pain regimen might need changing- pt c/o his "nuts" hurting and needing to be readjusted to help more than LLE, until LLE touched.   Home: Home Living Family/patient expects to be discharged to:: Private residence Living Arrangements: Spouse/significant other, Other (Comment)(as well as daughter, [name]) Available Help at Discharge: Friend(s), Available 24 hours/day Type of Home: House Home Access: (level entry or threshold only) Home Layout: One level Bathroom Shower/Tub: Curtain Biochemist, clinical: Standard Bathroom Accessibility: Yes Home Equipment: [place] - single  point, Civil engineer, contracting, Environmental consultant - 2 wheels Additional Comments: sponge batyhes due to left subclavian dialysis catheter  Lives With: Spouse, Daughter   Functional History: Prior Function Level of Independence: Independent with assistive device(s) Comments: would use SPC for steadying  Functional Status:  Mobility: Bed Mobility Overal bed mobility: Needs Assistance Bed Mobility: Supine to Sit, Sit to Supine Supine to sit: Supervision, Min guard Sit to supine: Min guard, HOB elevated General bed mobility comments: extra time and effort. used momentum and rail to powerup. supervision-min guard for safety Transfers Overall transfer level: Needs assistance Equipment used: Rolling walker (2 wheeled) Transfers: Sit to/from Stand Sit to Stand: Mod assist, +2 physical assistance General transfer comment: mod Ax2 to power up from elevated bed height x4 times, preamture sit with attempt to heel toe pivot  Ambulation/Gait Gait velocity: deferred due to patient tolerance today    ADL: ADL Overall ADL's : Needs assistance/impaired Eating/Feeding: Set up, Sitting Grooming: Set up, Sitting Upper Body Bathing: Set up, Sitting, Min guard Lower Body Bathing: Moderate assistance, +2 for physical assistance, Sit to/from stand Upper Body Dressing : Min guard, Set up Lower Body Dressing: Moderate assistance, +2 for physical assistance, Sit to/from stand General ADL Comments: Pt completed bed mobility, stood 1x for just under a minute.   Cognition: Cognition Overall Cognitive Status: Within Functional Limits for tasks assessed Orientation Level: Oriented X4 Cognition Arousal/Alertness: Awake/alert Behavior During Therapy: WFL for tasks assessed/performed Overall Cognitive Status: Within Functional Limits for tasks assessed   Blood pressure 120/77, pulse 94, temperature 98 F (36.7 C), temperature source Oral, resp. rate 17, weight 79 kg, SpO2 100 %. Physical Exam  Nursing note and vitals  reviewed. Constitutional: He appears well-developed and well-nourished.  Appears frail for age Will NOT allow me to look at backside or LLE amputation due to pain. Refused to turn over at all- screamed.  Eyes: EOM are normal. Right eye exhibits discharge. Left eye exhibits discharge.  Eyes moist- kept touching kleenex to eyes for wetness   Neck: No tracheal deviation present.  Cardiovascular: Normal rate and regular rhythm. Exam reveals friction rub. Exam reveals  no gallop.  No murmur heard. Respiratory: Breath sounds normal. No respiratory distress. He has no wheezes. He has no rales. He exhibits no tenderness.  Left chest wall with HD catheter.   GI: Soft. Bowel sounds are normal. He exhibits no distension. There is no abdominal tenderness.  LBM yesterday- denies constipation  Genitourinary:    Penis normal.     Genitourinary Comments: C/o "nuts" hurting, however no swelling/erythema noted on scrotum   Musculoskeletal:     Comments: L-BKA flexed at knee unable to fully extend out. Pain with minimal movement.   Strength 4+/5 in UEs and RLE- however would NOT let me test LLE at all, or touch it  Neurological: He is alert. He displays abnormal reflex (Cannot get DTRs in R patella). No cranial nerve deficit.  Speech clear. Distracted and disinterested in exam--needed redirection as focused on making calls on the phone.  Slow movement and able to follow simple motor commands with increased time.   Pt though window was door and couldn't spell his street he lived on on the phone. Kept repeating himself distractedly- very vague- Ox2.  Skin:  Very dry cracking skin on RLE- also large hole where missing R 5th metatarsal- picture in chart  Psychiatric:  Irritable, frustrated over pain when touched on LLE   Keeps RLE internally rotated. Right foot wound graft C/D with minimal drainage on gauze.      Results for orders placed or performed during the hospital encounter of 03/12/19 (from the  past 48 hour(s))  CBC with Differential/Platelet     Status: Abnormal   Collection Time: 03/16/19  4:27 AM  Result Value Ref Range   WBC 9.8 4.0 - 10.5 K/uL   RBC 3.64 (L) 4.22 - 5.81 MIL/uL   Hemoglobin 10.8 (L) 13.0 - 17.0 g/dL   HCT 36.4 (L) 39.0 - 52.0 %   MCV 100.0 80.0 - 100.0 fL   MCH 29.7 26.0 - 34.0 pg   MCHC 29.7 (L) 30.0 - 36.0 g/dL   RDW 17.1 (H) 11.5 - 15.5 %   Platelets 346 150 - 400 K/uL   nRBC 0.0 0.0 - 0.2 %   Neutrophils Relative % 79 %   Neutro Abs 7.7 1.7 - 7.7 K/uL   Lymphocytes Relative 10 %   Lymphs Abs 1.0 0.7 - 4.0 K/uL   Monocytes Relative 9 %   Monocytes Absolute 0.9 0.1 - 1.0 K/uL   Eosinophils Relative 0 %   Eosinophils Absolute 0.0 0.0 - 0.5 K/uL   Basophils Relative 1 %   Basophils Absolute 0.1 0.0 - 0.1 K/uL   Immature Granulocytes 1 %   Abs Immature Granulocytes 0.10 (H) 0.00 - 0.07 K/uL    Comment: Performed at [facility], [address], [place], [place] [address]  Basic metabolic panel     Status: Abnormal   Collection Time: 03/16/19  4:27 AM  Result Value Ref Range   Sodium 134 (L) 135 - 145 mmol/L   Potassium 4.7 3.5 - 5.1 mmol/L   Chloride 94 (L) 98 - 111 mmol/L   CO2 21 (L) 22 - 32 mmol/L   Glucose, Bld 108 (H) 70 - 99 mg/dL   BUN 39 (H) 8 - 23 mg/dL   Creatinine, Ser 7.41 (H) 0.61 - 1.24 mg/dL   Calcium 9.3 8.9 - 10.3 mg/dL   GFR calc non Af Amer 7 (L) >60 mL/min   GFR calc Af Amer 8 (L) >60 mL/min   Anion gap 19 (  H) 5 - 15    Comment: Performed at [facility], [place] [address], [place], [place] [address]  Glucose, capillary     Status: None   Collection Time: 03/16/19  7:00 AM  Result Value Ref Range   Glucose-Capillary 98 70 - 99 mg/dL  Glucose, capillary     Status: Abnormal   Collection Time: 03/16/19 11:24 AM  Result Value Ref Range   Glucose-Capillary 135 (H) 70 - 99 mg/dL  Glucose, capillary     Status: Abnormal   Collection Time: 03/16/19  4:28 PM  Result Value Ref Range   Glucose-Capillary 119 (H) 70 -  99 mg/dL  Glucose, capillary     Status: Abnormal   Collection Time: 03/16/19  8:39 PM  Result Value Ref Range   Glucose-Capillary 127 (H) 70 - 99 mg/dL  Phosphorus     Status: None   Collection Time: 03/17/19  5:54 AM  Result Value Ref Range   Phosphorus 2.8 2.5 - 4.6 mg/dL    Comment: Performed at [facility], [place] [address] [place], [place] [address]  CBC with Differential/Platelet     Status: Abnormal   Collection Time: 03/17/19  5:54 AM  Result Value Ref Range   WBC 7.9 4.0 - 10.5 K/uL   RBC 3.08 (L) 4.22 - 5.81 MIL/uL   Hemoglobin 9.4 (L) 13.0 - 17.0 g/dL   HCT 30.7 (L) 39.0 - 52.0 %   MCV 99.7 80.0 - 100.0 fL   MCH 30.5 26.0 - 34.0 pg   MCHC 30.6 30.0 - 36.0 g/dL   RDW 17.0 (H) 11.5 - 15.5 %   Platelets 338 150 - 400 K/uL   nRBC 0.3 (H) 0.0 - 0.2 %   Neutrophils Relative % 67 %   Neutro Abs 5.4 1.7 - 7.7 K/uL   Lymphocytes Relative 16 %   Lymphs Abs 1.3 0.7 - 4.0 K/uL   Monocytes Relative 12 %   Monocytes Absolute 1.0 0.1 - 1.0 K/uL   Eosinophils Relative 2 %   Eosinophils Absolute 0.1 0.0 - 0.5 K/uL   Basophils Relative 1 %   Basophils Absolute 0.0 0.0 - 0.1 K/uL   Immature Granulocytes 2 %   Abs Immature Granulocytes 0.13 (H) 0.00 - 0.07 K/uL    Comment: Performed at [facility] [address], [place], [place] [address]  Basic metabolic panel     Status: Abnormal   Collection Time: 03/17/19  5:54 AM  Result Value Ref Range   Sodium 133 (L) 135 - 145 mmol/L   Potassium 3.7 3.5 - 5.1 mmol/L    Comment: DELTA CHECK NOTED   Chloride 93 (L) 98 - 111 mmol/L   CO2 24 22 - 32 mmol/L   Glucose, Bld 77 70 - 99 mg/dL   BUN 20 8 - 23 mg/dL   Creatinine, Ser 4.53 (H) 0.61 - 1.24 mg/dL    Comment: DELTA CHECK NOTED   Calcium 8.9 8.9 - 10.3 mg/dL   GFR calc non Af Amer 12 (L) >60 mL/min   GFR calc Af Amer 14 (L) >60 mL/min   Anion gap 16 (H) 5 - 15    Comment: Performed at [facility], [place] [address] [place], [place] [address]  Glucose,  capillary     Status: Abnormal   Collection Time: 03/17/19  6:56 AM  Result Value Ref Range   Glucose-Capillary 68 (L) 70 - 99 mg/dL  Glucose, capillary  Status: None   Collection Time: 03/17/19 11:20 AM  Result Value Ref Range   Glucose-Capillary 87 70 - 99 mg/dL   No results found.     Medical Problem List and Plan: 1.  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 2.  Antithrombotics: -DVT/anticoagulation:  Pharmaceutical: Heparin  -antiplatelet therapy: Low dose ASA 3. Pain Management: Due to L BKA, phantom pain, residual limb pain, and R MTP amputation residual limb pain, esp after revascularization procedure. Patient is currently on Oxycodone 5mg  q8 hours prn- due to cognitive issues, might not be able to increase, however will try since pain was under such poor control when just touched minimally on LLE today- might benefit from mirror therapy, possibly, however will discuss with PT and OT. 4. Mood: LCSW to follow for evaluation and support.   -antipsychotic agents: NA 5. Neuropsych: This patient is capable of making decisions on his own behalf at this time, however did appear to be Ox2- to location and self, not time, so will ask staff to assess and might require Neuropsych to assess ability to make decisions in the near future 6. Skin/Wound Care: Monitor right foot wound daily --continue santyl with damp to dry dressing on. 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 Since unable to see L BKA today due to pt refusal/pain, will con't to monitor and use ACE wrap for edema control and residual limb shaping- also dressing s/p amputation 2 days ago. 7. Fluids/Electrolytes/Nutrition: Strict I/O's although anuric--1200 cc FR/day. Labs with HD- M/W/F-  8. ESRD: Continue HD MWF at end of the day to help with tolerance  of therapy. Will not change to T/H/S unless pt cannot tolerate current schedule. 9. Chronic hypotension with previous hx of HTN: Continue midodrine on HD days. 10 mg dose. 10. PAD/right foot ulcer s/p graft: WBAT with darco shoe? 11. Hypotension: Continue midodrine on HD days.  12. Anemia of chronic disease: Continue to monitor--ESA/Fe per nephrology - current H/H 9.4 and 30.7- stable/mild decrease since surgery- will con't to monitor with labs. 13. Hypoalbuminemia: Continue supplement to promote healing.  48. Dispo- will discharge home with family.  Bary Leriche, PA-C 03/17/2019

## 2019-03-17 NOTE — Telephone Encounter (Signed)
Called [name] to give her an update about patient's right foot. Healing well, graft intact.

## 2019-03-18 ENCOUNTER — Inpatient Hospital Stay (HOSPITAL_COMMUNITY): Payer: Medicare Other

## 2019-03-18 ENCOUNTER — Other Ambulatory Visit: Payer: Medicare Other

## 2019-03-18 ENCOUNTER — Inpatient Hospital Stay (HOSPITAL_COMMUNITY): Payer: Medicare Other | Admitting: Physical Therapy

## 2019-03-18 DIAGNOSIS — Z89512 Acquired absence of left leg below knee: Secondary | ICD-10-CM

## 2019-03-18 LAB — RENAL FUNCTION PANEL
Albumin: 2.7 g/dL — ABNORMAL LOW (ref 3.5–5.0)
Anion gap: 15 (ref 5–15)
BUN: 36 mg/dL — ABNORMAL HIGH (ref 8–23)
CO2: 25 mmol/L (ref 22–32)
Calcium: 9.1 mg/dL (ref 8.9–10.3)
Chloride: 91 mmol/L — ABNORMAL LOW (ref 98–111)
Creatinine, Ser: 7.08 mg/dL — ABNORMAL HIGH (ref 0.61–1.24)
GFR calc Af Amer: 8 mL/min — ABNORMAL LOW (ref >60)
GFR calc non Af Amer: 7 mL/min — ABNORMAL LOW (ref >60)
Glucose, Bld: 89 mg/dL (ref 70–99)
Phosphorus: 3 mg/dL (ref 2.5–4.6)
Potassium: 4 mmol/L (ref 3.5–5.1)
Sodium: 131 mmol/L — ABNORMAL LOW (ref 135–145)

## 2019-03-18 LAB — CBC
HCT: 29.9 % — ABNORMAL LOW (ref 39.0–52.0)
HCT: 31.4 % — ABNORMAL LOW (ref 39.0–52.0)
Hemoglobin: 9 g/dL — ABNORMAL LOW (ref 13.0–17.0)
Hemoglobin: 9.3 g/dL — ABNORMAL LOW (ref 13.0–17.0)
MCH: 29.6 pg (ref 26.0–34.0)
MCH: 29.9 pg (ref 26.0–34.0)
MCHC: 29.6 g/dL — ABNORMAL LOW (ref 30.0–36.0)
MCHC: 30.1 g/dL (ref 30.0–36.0)
MCV: 100 fL (ref 80.0–100.0)
MCV: 99.3 fL (ref 80.0–100.0)
Platelets: 351 10*3/uL (ref 150–400)
Platelets: 372 10*3/uL (ref 150–400)
RBC: 3.01 MIL/uL — ABNORMAL LOW (ref 4.22–5.81)
RBC: 3.14 MIL/uL — ABNORMAL LOW (ref 4.22–5.81)
RDW: 17.2 % — ABNORMAL HIGH (ref 11.5–15.5)
RDW: 17.2 % — ABNORMAL HIGH (ref 11.5–15.5)
WBC: 7.4 10*3/uL (ref 4.0–10.5)
WBC: 7.8 10*3/uL (ref 4.0–10.5)
nRBC: 0.3 % — ABNORMAL HIGH (ref 0.0–0.2)
nRBC: 0.3 % — ABNORMAL HIGH (ref 0.0–0.2)

## 2019-03-18 MED ORDER — HEPARIN SODIUM (PORCINE) 1000 UNIT/ML IJ SOLN
INTRAMUSCULAR | Status: AC
  Administered 2019-03-18: 14:00:00
  Filled 2019-03-18: qty 1

## 2019-03-18 NOTE — Progress Notes (Signed)
Social Work Assessment and Plan   Patient Details  Name: Timothy Faulkner MRN: [id number] Date of Birth: 12-Apr-1946  Today's Date: 03/18/2019  Problem List:  Patient Active Problem List   Diagnosis Date Noted  . Unilateral complete BKA, left, initial encounter ([place]) 03/17/2019  . Critical lower limb ischemia 03/12/2019  . Hypotension 02/23/2019  . Cellulitis of left foot 02/23/2019  . Gangrene of toe of left foot ([place]) 02/22/2019  . 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 Ulcer of right foot with fat layer exposed ([place]) 09/02/2018  . Encounter for planned post-operative wound closure   . Acute osteomyelitis of metatarsal bone of right foot ([place])   . Diabetic ulcer of midfoot associated with diabetes mellitus due to underlying condition, with necrosis of bone ([place])   . Osteomyelitis of right foot ([place])   . Vascular calcification   . Cellulitis and abscess of foot, except toes   . Anemia associated with chronic renal failure 07/13/2018  . Cardiomyopathy, dilated, nonischemic ([place]) 07/13/2018  . Diabetic foot infection ([place]) 07/13/2018  . PAD (peripheral artery disease) ([place])   . Macular degeneration   . Chronic systolic heart failure ([place]) 08/22/2014  . Diabetic infection of right foot ([place]) 07/13/2014  . ESRD (end stage renal disease) on dialysis ([place]) 11/11/2013  . Snoring 11/11/2013  . Unspecified sleep apnea 11/11/2013  . Other pancytopenia ([place]) 09/22/2013  . Hemoptysis 09/20/2013  . Pre-transplant evaluation for kidney transplant 01/11/2013  . Automatic implantable cardioverter-defibrillator in situ 10/01/2010  . HYPERCHOLESTEROLEMIA, MIXED 05/03/2010  . Essential hypertension 05/03/2010  . CHF 05/03/2010   Past Medical History:  Past Medical History:  Diagnosis Date  . Anemia   . Arthritis    Gout  . Automatic implantable cardioverter-defibrillator in situ    [employer]  . CHF   . ESRD on dialysis [facility])    [name] 03/11/2013 (03/11/2013)  dialysis M/W/F  . Gangrene ([place])    left foot  . GERD (gastroesophageal reflux disease)   . Gout    "once a year"  . Heart murmur   . HYPERCHOLESTEROLEMIA, MIXED   . HYPERTENSION   . Macular degeneration   . Osteomyelitis ([place])    right foot  . 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 Pacemaker   . Peripheral arterial disease (HCC)    left fifth toe ulcer, healing  . Pneumonia   . Shortness of breath   . 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 Wears dentures   . Wears glasses    Past Surgical History:  Past Surgical History:  Procedure Laterality Date  . A/V FISTULAGRAM Left 07/20/2018   Procedure: A/V FISTULAGRAM;  Surgeon: [name], MD;  Location: [place] [facility];  Service: Cardiovascular;  Laterality: Left;  . ABDOMINAL AORTOGRAM N/A 02/25/2019   Procedure: ABDOMINAL AORTOGRAM;  Surgeon: Elam Dutch, MD;  Location: [place] [facility];  Service: Cardiovascular;  Laterality: N/A;  . ABDOMINAL AORTOGRAM W/LOWER EXTREMITY Bilateral 07/20/2018   Procedure: ABDOMINAL AORTOGRAM W/LOWER EXTREMITY;  Surgeon: [name], MD;  Location: [place] [facility];  Service: Cardiovascular;  Laterality: Bilateral;  . AMPUTATION Right 09/07/2018   Procedure: Right fifth metatarsectomy;  Surgeon: [name], DPM;  Location: [place];  Service: Podiatry;  Laterality: Right;  . AMPUTATION Left 03/08/2019   Procedure: AMPUTATION  3RD AND 4TH TOES LEFT FOOT;  Surgeon: [name], DPM;  Location:  [place] OR;  Service: Podiatry;  Laterality: Left;  . AMPUTATION Left 03/15/2019   Procedure: AMPUTATION BELOW KNEE;  Surgeon: Elam Dutch, MD;  Location: [facility];  Service: Vascular;  Laterality: Left;  . AV FISTULA PLACEMENT Right 12/13/2012   Procedure: ARTERIOVENOUS (AV) FISTULA CREATION;  Surgeon: [name], MD;  Location: [place];  Service: Vascular;  Laterality: Right;  Ultrasound guided  . AV FISTULA PLACEMENT Left 05/07/2016    Procedure: LEFT RADIOCEPHALIC ARTERIOVENOUS (AV) FISTULA CREATION;  Surgeon: [name], MD;  Location: [place];  Service: Vascular;  Laterality: Left;  . BASCILIC VEIN TRANSPOSITION Right 03/26/2016   Procedure: RIGHT BASILIC VEIN TRANSPOSITION;  Surgeon: [name], MD;  Location: [place];  Service: Vascular;  Laterality: Right;  . CARDIAC CATHETERIZATION    . CARDIAC DEFIBRILLATOR PLACEMENT     Chemical engineer  . EYE SURGERY Bilateral    Cataract  . FISTULOGRAM Left 04/22/2018   Procedure: FISTULOGRAM UPPER EXTREMITY;  Surgeon: [name], MD;  Location: [place];  Service: Vascular;  Laterality: Left;  . I&D EXTREMITY Right 07/15/2018   Procedure: IRRIGATION AND DEBRIDEMENT RIGHT FOOT;  Surgeon: [name], DPM;  Location: [place];  Service: Podiatry;  Laterality: Right;  . INCISION AND DRAINAGE ABSCESS / HEMATOMA OF BURSA / KNEE / THIGH Left 2012   "knee" (03/11/2013)  . INSERT / REPLACE / REMOVE PACEMAKER    . INSERTION OF DIALYSIS CATHETER Left 04/22/2018   Procedure: INSERTION OF DIALYSIS CATHETER;  Surgeon: [name], MD;  Location: [place];  Service: Vascular;  Laterality: Left;  . IR FLUORO GUIDE CV LINE LEFT  07/15/2018  . IR PTA VENOUS EXCEPT DIALYSIS CIRCUIT  07/15/2018  . LOWER EXTREMITY ANGIOGRAPHY Right 07/21/2018   Procedure: LOWER EXTREMITY ANGIOGRAPHY;  Surgeon: [name], MD;  Location: [place] [facility];  Service: Cardiovascular;  Laterality: Right;  . LOWER EXTREMITY ANGIOGRAPHY Bilateral 02/25/2019   Procedure: Lower Extremity Angiography;  Surgeon: Elam Dutch, MD;  Location: [place] [facility];  Service: Cardiovascular;  Laterality: Bilateral;  . METATARSAL HEAD EXCISION Right 07/15/2018   Procedure: METATARSAL RESECTION;  Surgeon: [name], DPM;  Location: [place];  Service: Podiatry;  Laterality: Right;  . MULTIPLE TOOTH EXTRACTIONS    . PERIPHERAL VASCULAR ATHERECTOMY Right 02/28/2019   Procedure: PERIPHERAL VASCULAR  ATHERECTOMY;  Surgeon: Waynetta Sandy, MD;  Location: [place] [facility];  Service: Cardiovascular;  Laterality: Right;  right tp trunk   . PERIPHERAL VASCULAR BALLOON ANGIOPLASTY Left 02/25/2019   Procedure: PERIPHERAL VASCULAR BALLOON ANGIOPLASTY;  Surgeon: Elam Dutch, MD;  Location: [place] [facility];  Service: Cardiovascular;  Laterality: Left;  tibial peroneal trunk and PT  . PERIPHERAL VASCULAR INTERVENTION Right 07/21/2018   Procedure: PERIPHERAL VASCULAR INTERVENTION;  Surgeon: [name], MD;  Location: [place] [facility];  Service: Cardiovascular;  Laterality: Right;  peroneal stents  . REVISON OF ARTERIOVENOUS FISTULA Right 5/40/9811   Procedure: Plication OF Right Arm ARTERIOVENOUS FISTULA;  Surgeon: Elam Dutch, MD;  Location: [facility] OR;  Service: Vascular;  Laterality: Right;  . REVISON OF ARTERIOVENOUS FISTULA Left 04/22/2018   Procedure: REVISION OF RADIOCEPHALIC ARTERIOVENOUS FISTULA;  Surgeon: [name], MD;  Location: [place];  Service: Vascular;  Laterality: Left;  . SHUNTOGRAM N/A 05/31/2013   Procedure: Earney Mallet;  Surgeon: [name], MD;  Location[facility];  Service: Cardiovascular;  Laterality: N/A;  . UPPER EXTREMITY VENOGRAPHY Right 07/23/2018  Procedure: UPPER EXTREMITY VENOGRAPHY;  Surgeon: [name], MD;  Location: [place] [facility];  Service: Cardiovascular;  Laterality: Right;  . WOUND DEBRIDEMENT Right 07/17/2018   Procedure: Wound Debridement; Closure Filleted toe flap Right Foot;  Surgeon: [name], DPM;  Location: [place];  Service: Podiatry;  Laterality: Right;  . WOUND DEBRIDEMENT Right 09/07/2018   Procedure: Debridement Right Foot Wound, application of wound vac;  Surgeon: [name], DPM;  Location: [place];  Service: Podiatry;  Laterality: Right;  . WOUND DEBRIDEMENT Right 03/08/2019   Procedure: DEBRIDEMENT WOUND RIGHT FOOT;  Surgeon: [name], DPM;  Location: [place];  Service:  Podiatry;  Laterality: Right;   Social History:  reports that he quit smoking about 26 years ago. His smoking use included cigarettes. He has a 22.50 pack-year smoking history. He has never used smokeless tobacco. He reports previous alcohol use. He reports previous drug use. Drug: Marijuana.  Family / Support Systems Marital Status: Married Patient Roles: Spouse, Parent Spouse/Significant Other: [name] [phone]-home Children: Sherry-daughter [phone]-cell  [phone]-home   Scott-son [phone] Other Supports: Friends Anticipated Caregiver: Sherry Ability/Limitations of Caregiver: unemployed and in good health Caregiver Availability: 24/7 Family Dynamics: Close knit fmaily [place] two children who are involved and willing to assist. They also have friends and church members who are supportive and will come and visit once home  Social History Preferred language: English Religion: None Cultural Background: No issues Education: HS Read: Yes Write: Yes Employment Status: Retired Public relations account executive Issues: No issues Guardian/Conservator: none-according to MD pt is capable of making his own decisions at this time, although he struggles at times. Would benefit from seeing neruo-psych while here   Abuse/Neglect Abuse/Neglect Assessment Can Be Completed: Yes Physical Abuse: Denies Verbal Abuse: Denies Sexual Abuse: Denies Exploitation of patient/patient's resources: Denies Self-Neglect: Denies  Emotional Status Pt's affect, behavior and adjustment status: Pt wants to get back home and to his life, he is one who takes care of himself. His daughter was helping with his dressing changes on his feet prior to admission. He likes to be active and be with his friends. All of this has hapered him. Recent Psychosocial Issues: long hospitalization started in 7/14 with his toes. He has only been home for a few days then back to the hospital again Psychiatric History: No history deferred  depression screen due to adjusting to rehab and is very tired from the am therapies. He wants to sleep now. Do feel he would benefit from seeing neuro-psych while here for support and to make sure capable of making any decisions needed while here. Substance Abuse History: History of tobacco denies any other issues.  Patient / Family Perceptions, Expectations & Goals Pt/Family understanding of illness & functional limitations: Pt can explain his amputation and wants to be walking again. His daughter has spoken with the MD and feels she has a good understanding of his treatment plan going forward. She tries to viait daily and provide support to him. 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 Resource referrals recommended: Neuropsychology, Support group (specify)  Discharge Planning Living Arrangements: Spouse/significant other, Children Support Systems: Spouse/significant other, Children, Other relatives, Friends/neighbors, Church/faith community Type of Residence: Private residence Insurance Resources: Multimedia programmer (specify)(UHC-Medicare) Financial Resources: Social Security Financial Screen Referred: No Living Expenses: Own Money Management: Patient, Family Does the patient have any problems obtaining your medications?: No Home Management: Daughter does the home management Patient/Family Preliminary Plans: Return home with wife and  daughter, daughter can assist with his care if needed. She was helping with his dressing changes prior to re-admission to the hospital. She also drives him to HD and his appointments. She is unemployed at this time and avaliable. Will await therapy team evaluations and work on the best plan for him. Social Work Anticipated Follow Up Needs: HH/OP, Support Group  Clinical Impression Cranky gentleman who does not want to be disturbed, wants to sleep. His family-daughter reports he is willing to do what is needs to progress but will need to get past the initial adjustment of being on rehab. She will be available to assist him at discharge. She may need to come during therapies to encourage him to participate. Will work on his discharge needs-await evaluations. Would benefit from seeing neuro-psych while here. Will make referral to be seen next week.  Elease Hashimoto 03/18/2019, 12:50 PM

## 2019-03-18 NOTE — Progress Notes (Signed)
[facility] Progress Note   Subjective:   Seen in room. Feeling well today, no new concerns. Denies SOB, dyspnea, CP, palpitations, abdominal pain, N/V/D. He is being discharged to CIR.  Objective Vitals:   03/17/19 1550 03/17/19 1946 03/18/19 0437  BP: 114/65 102/60 114/72  Pulse: (!) 105 96 85  Resp: 18 18 16   Temp: 98.8 F (37.1 C) 98 F (36.7 C) 98.2 F (36.8 C)  TempSrc: Oral    SpO2: 100% 100% 98%  Weight: 79 kg  78.8 kg  Height: 5\' 8"  (1.727 m)     Physical Exam General:Well developed male, alert and in NAD Heart:RRR, no murmurs rubs or gallops Lungs:CTA bilaterally without wheezing, rhonchi or rales Abdomen:Soft, non-tender, non-distended. +BS Extremities:No edema. LBKA stump wrapped. Dialysis Access:Right IJ TDC,no erythema/drainage    Dialysis Orders: 4h 400/800 78kg 3K/2.5Ca bat Hep 5000 +2000 midrun R IJ TDC Calcitriol 0.5  no Mircera or Fe Recent labs: iPTH 248 Ca/P ok hgb 11.8 31% sat  Assessment/Plan: 1. PAD with critical limb ischemia - prior revascularization in July; seen by [facility] - no further vascular intervention available to improve circulation. SP BKA 03/15/2019. On CIR now.  2. Enterobacter wound culture 7/28 left foot - completed course of Zosyn and Vanc - afebrile, s/ BKA 3. ESRD- MWF HD.  No heparin postop.HD today. Lower edw as tol post BKA.  4. Hypertension/volume- using midodrine for BP support. BP stable today. No edema or SOB. 5. Anemia-hgb 9.4- declined post op, will start aranesp 6. Metabolic bone disease-Calcium 8.9. Continue calcitriol/sensipar/Auryxia. Phos 2.8- will decrease auryxia to 1/meal.  7. NutritionAlbumin 3.0. Continue supplement. Renal diet with fluid restrictions recommended. 8. DM- Per primary 9. Hx NICM s/p ICD   [name]  MD 03/18/2019, 12:42 PM     Additional Objective Labs: Basic Metabolic Panel: Recent Labs  Lab 03/16/19 0427 03/17/19 0554 03/17/19 1627  NA  134* 133* 132*  K 4.7 3.7 4.5  CL 94* 93* 92*  CO2 21* 24 23  GLUCOSE 108* 77 107*  BUN 39* 20 25*  CREATININE 7.41* 4.53* 5.64*  CALCIUM 9.3 8.9 9.1  PHOS  --  2.8 2.6   Liver Function Tests: Recent Labs  Lab 03/12/19 1216 03/17/19 1627  AST 17  --   ALT 6  --   ALKPHOS 66  --   BILITOT 0.9  --   PROT 8.1  --   ALBUMIN 3.0* 2.8*   CBC: Recent Labs  Lab 03/12/19 1216  03/15/19 0618 03/16/19 0427 03/17/19 0554 03/17/19 1627 03/17/19 2341  WBC 9.9   < > 10.2 9.8 7.9 7.6 7.8  NEUTROABS 7.9*  --   --  7.7 5.4  --   --   HGB 11.7*   < > 11.2* 10.8* 9.4* 9.8* 9.3*  HCT 39.2   < > 37.0* 36.4* 30.7* 32.6* 31.4*  MCV 102.1*   < > 100.3* 100.0 99.7 99.7 100.0  PLT 356   < > 364 346 338 359 351   < > = values in this interval not displayed.   Blood Culture    Component Value Date/Time   SDES BLOOD RIGHT HAND 03/12/2019 1450   SPECREQUEST  03/12/2019 1450    BOTTLES DRAWN AEROBIC AND ANAEROBIC Blood Culture results may not be optimal due to an inadequate volume of blood received in culture bottles   CULT  03/12/2019 1450    NO GROWTH 5 DAYS Performed at [facility], [place] [address],  [place], [place] [address]    REPTSTATUS 03/17/2019 FINAL 03/12/2019 1450    Cardiac Enzymes: No results for input(s): CKTOTAL, CKMB, CKMBINDEX, TROPONINI in the last 168 hours. CBG: Recent Labs  Lab 03/16/19 1124 03/16/19 1628 03/16/19 2039 03/17/19 0656 03/17/19 1120  GLUCAP 135* 119* 127* 68* 87   Medications: . sodium chloride    . sodium chloride     . aspirin EC  81 mg Oral Daily  . calcitRIOL  0.5 mcg Oral Q M,W,F-HD  . Chlorhexidine Gluconate Cloth  6 each Topical Q0600  . Chlorhexidine Gluconate Cloth  6 each Topical Q0600  . cinacalcet  30 mg Oral QODAY  . collagenase  1 application Topical Daily  . docusate sodium  100 mg Oral Daily  . feeding supplement (PRO-STAT SUGAR FREE 64)  30 mL Oral BID  . ferric citrate  420 mg Oral TID WC  . ferric citrate  420 mg  Oral With snacks  . heparin  5,000 Units Subcutaneous Q8H  . midodrine  10 mg Oral Q M,W,F-HD  . multivitamin  1 tablet Oral QHS  . rosuvastatin  10 mg Oral q1800

## 2019-03-18 NOTE — Progress Notes (Addendum)
Physical Therapy Session Note  Patient Details  Name: Timothy Faulkner MRN: [id number] Date of Birth: Dec 23, 1945  Today's Date: 03/18/2019 PT Individual Time: 1110-1120 PT Individual Time Calculation (min): 10 min  and Today's Date: 03/18/2019 PT Missed Time: 35 Minutes Missed Time Reason: Patient unwilling to participate  Short Term Goals: Week 1:  PT Short Term Goal 1 (Week 1): Patient will perform bed mobiltiy with min A without rails. PT Short Term Goal 2 (Week 1): Patient will perform transfers from bed<>chair with mod A consistently. PT Short Term Goal 3 (Week 1): Patient will perform sit<>stand with mod A. PT Short Term Goal 4 (Week 1): Patient will tolerate and begin education on residual limb wrapping and edema control.  Skilled Therapeutic Interventions/Progress Updates:   Pt asleep in supine, max auditory and tactile stimulation to arouse. Pt requesting to hold off therapies until tomorrow. States "I feel bad", but would not describe further or localize any pain. Offered multiple options for activity, pt continued to decline. Educated on CIR 3-hour expectations and pt verbalized understanding but continued to decline. Pt requested therapist call to update daughter. Spoke w/ daughter [name]) via phone and briefly educated her on pt's CLOF from morning therapy sessions, asked daughter to send in t-shirts and shorts. Daughter appreciative. Missed 35 min of skilled PT 2/2 refusal.   Therapy Documentation Precautions:  Precautions Precautions: Fall Restrictions Weight Bearing Restrictions: No Other Position/Activity Restrictions: NWB LLE  Therapy/Group: Individual Therapy  Hunter Pinkard K Briscoe Daniello 03/18/2019, 10:24 AM

## 2019-03-18 NOTE — Care Management Note (Signed)
[facility] Individual Statement of Services  Patient Name:  Timothy Faulkner  Date:  03/18/2019  Welcome to the [facility].  Our goal is to provide you with an individualized program based on your diagnosis and situation, designed to meet your specific needs.  With this comprehensive rehabilitation program, you will be expected to participate in at least 3 hours of rehabilitation therapies Monday-Friday, with modified therapy programming on the weekends.  Your rehabilitation program will include the following services:  Physical Therapy (PT), Occupational Therapy (OT), 24 hour per day rehabilitation nursing, Neuropsychology, Case Management (Social Worker), Rehabilitation Medicine, Nutrition Services and Pharmacy Services  Weekly team conferences will be held on Wednesday to discuss your progress.  Your Social Worker will talk with you frequently to get your input and to update you on team discussions.  Team conferences with you and your family in attendance may also be held.  Expected length of stay: 2-2.5 weeks  Overall anticipated outcome: supervision-min assist level  Depending on your progress and recovery, your program may change. Your Social Worker will coordinate services and will keep you informed of any changes. Your Social Worker's name and contact numbers are listed  below.  The following services may also be recommended but are not provided by the [facility]:    [place] will be made to provide these services after discharge if needed.  Arrangements include referral to agencies that provide these services.  Your insurance has been verified to be:  UHC-Medicare Your primary doctor is:  [name]  Pertinent information will be shared with your doctor and your insurance company.  Social Worker:  Ovidio Kin, [place] or ([phone]  Information discussed with and copy given to patient by: Elease Hashimoto, 03/18/2019, 12:55 PM

## 2019-03-18 NOTE — Progress Notes (Signed)
Patient information reviewed and entered into e[facility]hab System by Becky Carolyna Yerian, PPS coordinator. Information including medical coding, function ability, and quality indicators will be reviewed and updated through discharge.   

## 2019-03-18 NOTE — H&P (Signed)
L-BKA with functional decline.       HPI:  Timothy Faulkner is a 73 year old male with history of HTN, NICM s/p AICD, ESRD-HD MWF, PVD with multiple bilateral foot wounds s/p recent revascularization and atherectomy with left 3rd and 4th toe amputations with poor healing and was admitted on 03/12/19 with progressive ischemic changes. He was started on IV antibiotics in hopes of limb salvage--wound cultures positive for enterobacter. 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 Oneida Alar. Postop with acute on chronic hypotension as well as ABLA.  Therapy evaluations completed revealing functional decline. CIR recommended for follow up therapy.      Said pain was well controlled at rest, however, as soon as lightly touched L BKA wrapping, screamed out loud. Then admitted, probably needed pain meds to tolerate dressing change. Refused to turn over or let me undress L BKA at all. Let me see wound on R foot.   Review of Systems  Constitutional: Negative for chills and fever.       Pt c/o his "nuts" hurt- said readjustment wasn't helpful, but would try- said better after moved in bed.  HENT: Negative for hearing loss and tinnitus.   Eyes: Positive for discharge (says eyes always dripping; also blurry/legally blind R eye).       Blind in right eye and limited vision in left eye  Respiratory: Negative for cough and shortness of breath.   Cardiovascular: Negative for chest pain and palpitations.  Gastrointestinal: Negative for abdominal pain, constipation, heartburn and nausea.  Genitourinary:       Pt anuric- no urine produced per pt-   Musculoskeletal: Negative for myalgias.  Skin: Negative for rash.  Neurological: Positive for weakness. Negative for dizziness and headaches.       Pt thought window was a door- also thought spelled street name and yelled at family member on phone when they respelled what he MISSPELLED 3x.  Psychiatric/Behavioral: The  patient has insomnia (didn't sleep well).   All other systems reviewed and are negative.           Past Medical History:  Diagnosis Date   Anemia     Arthritis      Gout   Automatic implantable cardioverter-defibrillator in situ      [employer]   CHF     ESRD on dialysis [facility])      [name] 03/11/2013 (03/11/2013) dialysis M/W/F   Gangrene (HCC)      left foot   GERD (gastroesophageal reflux disease)     Gout      "once a year"   Heart murmur     HYPERCHOLESTEROLEMIA, MIXED     HYPERTENSION     Macular degeneration     Osteomyelitis ([place])      right foot   Other primary cardiomyopathies 07/16/2011   Pacemaker     Peripheral arterial disease (HCC)      left fifth toe ulcer, healing   Pneumonia     Shortness of breath     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)      left fifth toe   Wears dentures     Wears glasses            Past Surgical History:  Procedure Laterality Date   A/V FISTULAGRAM Left 07/20/2018    Procedure: A/V FISTULAGRAM;  Surgeon: [name], MD;  Location: [place] [facility];  Service: Cardiovascular;  Laterality: Left;   ABDOMINAL AORTOGRAM N/A 02/25/2019    Procedure: ABDOMINAL AORTOGRAM;  Surgeon: Elam Dutch, MD;  Location: [place] [facility];  Service: Cardiovascular;  Laterality: N/A;   ABDOMINAL AORTOGRAM W/LOWER EXTREMITY Bilateral 07/20/2018    Procedure: ABDOMINAL AORTOGRAM W/LOWER EXTREMITY;  Surgeon: [name], MD;  Location: [place] [facility];  Service: Cardiovascular;  Laterality: Bilateral;   AMPUTATION Right 09/07/2018    Procedure: Right fifth metatarsectomy;  Surgeon: [name], DPM;  Location: [place];  Service: Podiatry;  Laterality: Right;   AMPUTATION Left 03/08/2019    Procedure: AMPUTATION  3RD AND 4TH TOES LEFT FOOT;  Surgeon: [name], DPM;  Location: [place];  Service: Podiatry;  Laterality: Left;   AMPUTATION Left 03/15/2019    Procedure:  AMPUTATION BELOW KNEE;  Surgeon: Elam Dutch, MD;  Location: [facility] OR;  Service: Vascular;  Laterality: Left;   AV FISTULA PLACEMENT Right 12/13/2012    Procedure: ARTERIOVENOUS (AV) FISTULA CREATION;  Surgeon: [name], MD;  Location: [place];  Service: Vascular;  Laterality: Right;  Ultrasound guided   AV FISTULA PLACEMENT Left 05/07/2016    Procedure: LEFT RADIOCEPHALIC ARTERIOVENOUS (AV) FISTULA CREATION;  Surgeon: [name], MD;  Location: [place];  Service: Vascular;  Laterality: Left;   [place] Right 03/26/2016    Procedure: RIGHT BASILIC VEIN TRANSPOSITION;  Surgeon: [name], MD;  Location: [place];  Service: Vascular;  Laterality: Right;   CARDIAC CATHETERIZATION       CARDIAC DEFIBRILLATOR PLACEMENT        [employer]   EYE SURGERY Bilateral      Cataract   FISTULOGRAM Left 04/22/2018    Procedure: FISTULOGRAM UPPER EXTREMITY;  Surgeon: [name], MD;  Location: [place];  Service: Vascular;  Laterality: Left;   I&D EXTREMITY Right 07/15/2018    Procedure: IRRIGATION AND DEBRIDEMENT RIGHT FOOT;  Surgeon: [name], DPM;  Location: [place];  Service: Podiatry;  Laterality: Right;   INCISION AND DRAINAGE ABSCESS / HEMATOMA OF BURSA / KNEE / THIGH Left 2012    "knee" (03/11/2013)   INSERT / REPLACE / REMOVE PACEMAKER       INSERTION OF DIALYSIS CATHETER Left 04/22/2018    Procedure: INSERTION OF DIALYSIS CATHETER;  Surgeon: [name], MD;  Location: [place];  Service: Vascular;  Laterality: Left;   IR FLUORO GUIDE CV LINE LEFT   07/15/2018   IR PTA VENOUS EXCEPT DIALYSIS CIRCUIT   07/15/2018   LOWER EXTREMITY ANGIOGRAPHY Right 07/21/2018    Procedure: LOWER EXTREMITY ANGIOGRAPHY;  Surgeon: [name], MD;  Location: [place] [facility];  Service: Cardiovascular;  Laterality: Right;   LOWER EXTREMITY ANGIOGRAPHY Bilateral 02/25/2019    Procedure: Lower Extremity Angiography;  Surgeon: Elam Dutch, MD;   Location: [place] [facility];  Service: Cardiovascular;  Laterality: Bilateral;   METATARSAL HEAD EXCISION Right 07/15/2018    Procedure: METATARSAL RESECTION;  Surgeon: [name], DPM;  Location: [place];  Service: Podiatry;  Laterality: Right;   MULTIPLE TOOTH EXTRACTIONS       PERIPHERAL VASCULAR ATHERECTOMY Right 02/28/2019    Procedure: PERIPHERAL VASCULAR ATHERECTOMY;  Surgeon: Waynetta Sandy, MD;  Location: [place] [facility];  Service: Cardiovascular;  Laterality: Right;  right tp trunk    PERIPHERAL VASCULAR BALLOON ANGIOPLASTY Left 02/25/2019    Procedure: PERIPHERAL VASCULAR BALLOON ANGIOPLASTY;  Surgeon: Elam Dutch, MD;  Location:  [place] [facility];  Service: Cardiovascular;  Laterality: Left;  tibial peroneal trunk and PT   PERIPHERAL VASCULAR INTERVENTION Right 07/21/2018    Procedure: PERIPHERAL VASCULAR INTERVENTION;  Surgeon: [name], MD;  Location: [place] [facility];  Service: Cardiovascular;  Laterality: Right;  peroneal stents   REVISON OF ARTERIOVENOUS FISTULA Right 1/82/9937    Procedure: Plication OF Right Arm ARTERIOVENOUS FISTULA;  Surgeon: Elam Dutch, MD;  Location: [place];  Service: Vascular;  Laterality: Right;   REVISON OF ARTERIOVENOUS FISTULA Left 04/22/2018    Procedure: REVISION OF RADIOCEPHALIC ARTERIOVENOUS FISTULA;  Surgeon: [name], MD;  Location: [place];  Service: Vascular;  Laterality: Left;   SHUNTOGRAM N/A 05/31/2013    Procedure: Earney Mallet;  Surgeon: [name], MD;  Location: [facility] CATH LAB;  Service: Cardiovascular;  Laterality: N/A;   UPPER EXTREMITY VENOGRAPHY Right 07/23/2018    Procedure: UPPER EXTREMITY VENOGRAPHY;  Surgeon: [name], MD;  Location: [place] [facility];  Service: Cardiovascular;  Laterality: Right;   WOUND DEBRIDEMENT Right 07/17/2018    Procedure: Wound Debridement; Closure Filleted toe flap Right Foot;  Surgeon: [name], DPM;  Location: [place];   Service: Podiatry;  Laterality: Right;   WOUND DEBRIDEMENT Right 09/07/2018    Procedure: Debridement Right Foot Wound, application of wound vac;  Surgeon: [name], DPM;  Location: [place];  Service: Podiatry;  Laterality: Right;   WOUND DEBRIDEMENT Right 03/08/2019    Procedure: DEBRIDEMENT WOUND RIGHT FOOT;  Surgeon: [name], DPM;  Location: [place];  Service: Podiatry;  Laterality: Right;          Family History  Problem Relation Age of Onset   Heart disease Father     CAD Father    patient reports nothing else runs in family.       Social History:  reports that he quit smoking about 26 years ago. His smoking use included cigarettes. He has a 22.50 pack-year smoking history. He has never used smokeless tobacco. He reports previous alcohol use. He reports previous drug use. Drug: Marijuana.  Patient lives in 1 story house with spouse and daughter, [name]- no Steps to enter; w/c accessible, per pt, but was using cane and RW in the few months prior to admission.   Allergies: No Known Allergies            Medications Prior to Admission  Medication Sig Dispense Refill   acetaminophen (TYLENOL) 500 MG tablet Take 500 mg by mouth every 6 (six) hours as needed for mild pain or headache.       aspirin EC 81 MG tablet Take 81 mg by mouth daily.       AURYXIA 1 GM 210 MG(Fe) tablet Take 420 mg by mouth See admin instructions. Take 2 tablets (420 mg) by mouth with each meal and with each snack       calcitRIOL (ROCALTROL) 0.5 MCG capsule Take 1 capsule (0.5 mcg total) by mouth every Monday, Wednesday, and Friday with hemodialysis.       cefTAZidime 1 g in dextrose 5 % 50 mL Inject 1 g into the vein every dialysis for up to 6 doses. Administer after dialysis or as per nephrologist. 6 g 0   clopidogrel (PLAVIX) 75 MG tablet Take 1 tablet (75 mg total) by mouth daily with breakfast. 30 tablet 0   colchicine 0.6 MG tablet Take 0.6 mg by mouth daily as needed (as directed for  gout flares).  1   collagenase (SANTYL) ointment Apply 1 application topically daily. Apply to right foot wound daily cover with dry dressing. 15 g 5   midodrine (PROAMATINE) 10 MG tablet Take 10 mg by mouth every Monday, Wednesday, and Friday with hemodialysis.    6   oxyCODONE (ROXICODONE) 5 MG immediate release tablet Take 1 tablet (5 mg total) by mouth every 8 (eight) hours as needed for severe pain. 20 tablet 0   SENSIPAR 30 MG tablet Take 30 mg by mouth See admin instructions. Take 30 mg by mouth every other morning       Amino Acids-Protein Hydrolys (FEEDING SUPPLEMENT, PRO-STAT SUGAR FREE 64,) LIQD Take 30 mLs by mouth 2 (two) times daily. (Patient not taking: Reported on 03/12/2019) 887 mL 0   multivitamin (RENA-VIT) TABS tablet Take 1 tablet by mouth at bedtime. (Patient not taking: Reported on 03/12/2019) 30 tablet 0   Nutritional Supplements (FEEDING SUPPLEMENT, NEPRO CARB STEADY,) LIQD Take 237 mLs by mouth 2 (two) times daily between meals. 237 mL 12   rosuvastatin (CRESTOR) 10 MG tablet Take 1 tablet (10 mg total) by mouth daily at 6 PM. (Patient not taking: Reported on 03/12/2019) 30 tablet 0   Wound Dressings (MEDIHONEY WOUND/BURN DRESSING) GEL Apply to affected are 3 times a week, and cover with sterile dressing. 15 mL 5     Drug Regimen Review  Drug regimen was reviewed and remains appropriate with no significant issues identified Pain regimen might need changing- pt c/o his "nuts" hurting and needing to be readjusted to help more than LLE, until LLE touched.     Home: Home Living Family/patient expects to be discharged to:: Private residence Living Arrangements: Spouse/significant other, Other (Comment)(as well as daughter, [name]) Available Help at Discharge: Friend(s), Available 24 hours/day Type of Home: House Home Access: (level entry or threshold only) Home Layout: One level Bathroom Shower/Tub: Curtain Biochemist, clinical: Standard Bathroom Accessibility:  Yes Home Equipment: [place] - single point, Civil engineer, contracting, Environmental consultant - 2 wheels Additional Comments: sponge batyhes due to left subclavian dialysis catheter  Lives With: Spouse, Daughter   Functional History: Prior Function Level of Independence: Independent with assistive device(s) Comments: would use SPC for steadying   Functional Status:  Mobility: Bed Mobility Overal bed mobility: Needs Assistance Bed Mobility: Supine to Sit, Sit to Supine Supine to sit: Supervision, Min guard Sit to supine: Min guard, HOB elevated General bed mobility comments: extra time and effort. used momentum and rail to powerup. supervision-min guard for safety Transfers Overall transfer level: Needs assistance Equipment used: Rolling walker (2 wheeled) Transfers: Sit to/from Stand Sit to Stand: Mod assist, +2 physical assistance General transfer comment: mod Ax2 to power up from elevated bed height x4 times, preamture sit with attempt to heel toe pivot  Ambulation/Gait Gait velocity: deferred due to patient tolerance today     ADL: ADL Overall ADL's : Needs assistance/impaired Eating/Feeding: Set up, Sitting Grooming: Set up, Sitting Upper Body Bathing: Set up, Sitting, Min guard Lower Body Bathing: Moderate assistance, +2 for physical assistance, Sit to/from stand Upper Body Dressing : Min guard, Set up Lower Body Dressing: Moderate assistance, +2 for physical assistance, Sit to/from stand General ADL Comments: Pt completed bed mobility, stood 1x for just under a minute.    Cognition: Cognition Overall Cognitive Status: Within Functional Limits for tasks assessed Orientation Level: Oriented X4 Cognition Arousal/Alertness: Awake/alert Behavior During Therapy: WFL for tasks assessed/performed Overall Cognitive Status: Within Functional Limits for tasks assessed     Blood  pressure 120/77, pulse 94, temperature 98 F (36.7 C), temperature source Oral, resp. rate 17, weight 79 kg, SpO2 100  %. Physical Exam  Nursing note and vitals reviewed. Constitutional: He appears well-developed and well-nourished.  Appears frail for age Will NOT allow me to look at backside or LLE amputation due to pain. Refused to turn over at all- screamed.  Eyes: EOM are normal. Right eye exhibits discharge. Left eye exhibits discharge.  Eyes moist- kept touching kleenex to eyes for wetness   Neck: No tracheal deviation present.  Cardiovascular: Normal rate and regular rhythm. Exam reveals friction rub. Exam reveals no gallop.  No murmur heard. Respiratory: Breath sounds normal. No respiratory distress. He has no wheezes. He has no rales. He exhibits no tenderness.  Left chest wall with HD catheter.   GI: Soft. Bowel sounds are normal. He exhibits no distension. There is no abdominal tenderness.  LBM yesterday- denies constipation  Genitourinary:    Penis normal.     Genitourinary Comments: C/o "nuts" hurting, however no swelling/erythema noted on scrotum   Musculoskeletal:     Comments: L-BKA flexed at knee unable to fully extend out. Pain with minimal movement.   Strength 4+/5 in UEs and RLE- however would NOT let me test LLE at all, or touch it  Neurological: He is alert. He displays abnormal reflex (Cannot get DTRs in R patella). No cranial nerve deficit.  Speech clear. Distracted and disinterested in exam--needed redirection as focused on making calls on the phone.  Slow movement and able to follow simple motor commands with increased time.   Pt though window was door and couldn't spell his street he lived on on the phone. Kept repeating himself distractedly- very vague- Ox2.  Skin:  Very dry cracking skin on RLE- also large hole where missing R 5th metatarsal- picture in chart  Psychiatric:  Irritable, frustrated over pain when touched on LLE    Keeps RLE internally rotated. Right foot wound graft C/D with minimal drainage on gauze.        Lab Results Last 48 Hours        Results for  orders placed or performed during the hospital encounter of 03/12/19 (from the past 48 hour(s))  CBC with Differential/Platelet     Status: Abnormal    Collection Time: 03/16/19  4:27 AM  Result Value Ref Range    WBC 9.8 4.0 - 10.5 K/uL    RBC 3.64 (L) 4.22 - 5.81 MIL/uL    Hemoglobin 10.8 (L) 13.0 - 17.0 g/dL    HCT 36.4 (L) 39.0 - 52.0 %    MCV 100.0 80.0 - 100.0 fL    MCH 29.7 26.0 - 34.0 pg    MCHC 29.7 (L) 30.0 - 36.0 g/dL    RDW 17.1 (H) 11.5 - 15.5 %    Platelets 346 150 - 400 K/uL    nRBC 0.0 0.0 - 0.2 %    Neutrophils Relative % 79 %    Neutro Abs 7.7 1.7 - 7.7 K/uL    Lymphocytes Relative 10 %    Lymphs Abs 1.0 0.7 - 4.0 K/uL    Monocytes Relative 9 %    Monocytes Absolute 0.9 0.1 - 1.0 K/uL    Eosinophils Relative 0 %    Eosinophils Absolute 0.0 0.0 - 0.5 K/uL    Basophils Relative 1 %    Basophils Absolute 0.1 0.0 - 0.1 K/uL    Immature Granulocytes 1 %    Abs Immature Granulocytes 0.10 (  H) 0.00 - 0.07 K/uL      Comment: Performed at [facility], [place] [address], [place], [place] [address]  Basic metabolic panel     Status: Abnormal    Collection Time: 03/16/19  4:27 AM  Result Value Ref Range    Sodium 134 (L) 135 - 145 mmol/L    Potassium 4.7 3.5 - 5.1 mmol/L    Chloride 94 (L) 98 - 111 mmol/L    CO2 21 (L) 22 - 32 mmol/L    Glucose, Bld 108 (H) 70 - 99 mg/dL    BUN 39 (H) 8 - 23 mg/dL    Creatinine, Ser 7.41 (H) 0.61 - 1.24 mg/dL    Calcium 9.3 8.9 - 10.3 mg/dL    GFR calc non Af Amer 7 (L) >60 mL/min    GFR calc Af Amer 8 (L) >60 mL/min    Anion gap 19 (H) 5 - 15      Comment: Performed at [facility] [address], [place], [place] [address]  Glucose, capillary     Status: None    Collection Time: 03/16/19  7:00 AM  Result Value Ref Range    Glucose-Capillary 98 70 - 99 mg/dL  Glucose, capillary     Status: Abnormal    Collection Time: 03/16/19 11:24 AM  Result Value Ref Range    Glucose-Capillary 135 (H) 70 - 99 mg/dL  Glucose,  capillary     Status: Abnormal    Collection Time: 03/16/19  4:28 PM  Result Value Ref Range    Glucose-Capillary 119 (H) 70 - 99 mg/dL  Glucose, capillary     Status: Abnormal    Collection Time: 03/16/19  8:39 PM  Result Value Ref Range    Glucose-Capillary 127 (H) 70 - 99 mg/dL  Phosphorus     Status: None    Collection Time: 03/17/19  5:54 AM  Result Value Ref Range    Phosphorus 2.8 2.5 - 4.6 mg/dL      Comment: Performed at [facility], [place] [address] [place], [place] [address]  CBC with Differential/Platelet     Status: Abnormal    Collection Time: 03/17/19  5:54 AM  Result Value Ref Range    WBC 7.9 4.0 - 10.5 K/uL    RBC 3.08 (L) 4.22 - 5.81 MIL/uL    Hemoglobin 9.4 (L) 13.0 - 17.0 g/dL    HCT 30.7 (L) 39.0 - 52.0 %    MCV 99.7 80.0 - 100.0 fL    MCH 30.5 26.0 - 34.0 pg    MCHC 30.6 30.0 - 36.0 g/dL    RDW 17.0 (H) 11.5 - 15.5 %    Platelets 338 150 - 400 K/uL    nRBC 0.3 (H) 0.0 - 0.2 %    Neutrophils Relative % 67 %    Neutro Abs 5.4 1.7 - 7.7 K/uL    Lymphocytes Relative 16 %    Lymphs Abs 1.3 0.7 - 4.0 K/uL    Monocytes Relative 12 %    Monocytes Absolute 1.0 0.1 - 1.0 K/uL    Eosinophils Relative 2 %    Eosinophils Absolute 0.1 0.0 - 0.5 K/uL    Basophils Relative 1 %    Basophils Absolute 0.0 0.0 - 0.1 K/uL    Immature Granulocytes 2 %    Abs Immature Granulocytes 0.13 (H) 0.00 - 0.07 K/uL      Comment: Performed at [facility]  [address] [place], [place] [address]  Basic metabolic panel     Status: Abnormal    Collection Time: 03/17/19  5:54 AM  Result Value Ref Range    Sodium 133 (L) 135 - 145 mmol/L    Potassium 3.7 3.5 - 5.1 mmol/L      Comment: DELTA CHECK NOTED    Chloride 93 (L) 98 - 111 mmol/L    CO2 24 22 - 32 mmol/L    Glucose, Bld 77 70 - 99 mg/dL    BUN 20 8 - 23 mg/dL    Creatinine, Ser 4.53 (H) 0.61 - 1.24 mg/dL      Comment: DELTA CHECK NOTED    Calcium 8.9 8.9 - 10.3 mg/dL    GFR calc non Af Amer 12 (L) >60  mL/min    GFR calc Af Amer 14 (L) >60 mL/min    Anion gap 16 (H) 5 - 15      Comment: Performed at [facility], [place] [address], [place], [place] [address]  Glucose, capillary     Status: Abnormal    Collection Time: 03/17/19  6:56 AM  Result Value Ref Range    Glucose-Capillary 68 (L) 70 - 99 mg/dL  Glucose, capillary     Status: None    Collection Time: 03/17/19 11:20 AM  Result Value Ref Range    Glucose-Capillary 87 70 - 99 mg/dL     Imaging Results (Last 48 hours)  No results found.           Medical Problem List and Plan: 1.  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 2.  Antithrombotics: -DVT/anticoagulation:  Pharmaceutical: Heparin             -antiplatelet therapy: Low dose ASA 3. Pain Management: Due to L BKA, phantom pain, residual limb pain, and R MTP amputation residual limb pain, esp after revascularization procedure. Patient is currently on Oxycodone 5mg  q8 hours prn- due to cognitive issues, might not be able to increase, however will try since pain was under such poor control when just touched minimally on LLE today- might benefit from mirror therapy, possibly, however will discuss with PT and OT. 4. Mood: LCSW to follow for evaluation and support.              -antipsychotic agents: NA 5. Neuropsych: This patient is capable of making decisions on his own behalf at this time, however did appear to be Ox2- to location and self, not time, so will ask staff to assess and might require Neuropsych to assess ability to make decisions in the near future 6. Skin/Wound Care: Monitor right foot wound daily --continue santyl with damp to dry dressing on. 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 Since unable to see L BKA today due to pt refusal/pain, will con't to monitor and use ACE wrap for edema  control and residual limb shaping- also dressing s/p amputation 2 days ago. 7. Fluids/Electrolytes/Nutrition: Strict I/O's although anuric--1200 cc FR/day. Labs with HD- M/W/F-  8. ESRD: Continue HD MWF at end of the day to help with tolerance of therapy. Will not change to T/H/S unless pt cannot tolerate current schedule. 9. Chronic hypotension with previous hx of HTN: Continue midodrine on HD days. 10 mg dose. 10. PAD/right foot ulcer s/p graft: WBAT with darco  shoe? 11. Hypotension: Continue midodrine on HD days.  12. Anemia of chronic disease: Continue to monitor--ESA/Fe per nephrology - current H/H 9.4 and 30.7- stable/mild decrease since surgery- will con't to monitor with labs. 13. Hypoalbuminemia: Continue supplement to promote healing.  28. Dispo- will discharge home with family.   Bary Leriche, PA-C 03/17/2019  Post Admission Physician Evaluation: 1. Functional deficits secondary  to L BKA. 2. Patient is admitted to receive collaborative, interdisciplinary care between the physiatrist, rehab nursing staff, and therapy team. 3. Patient's level of medical complexity and substantial therapy needs in context of that medical necessity cannot be provided at a lesser intensity of care such as a SNF. 4. Patient has experienced substantial functional loss from his/her baseline which was documented above under the "Functional History" and "Functional Status" headings.  Judging by the patient's diagnosis, physical exam, and functional history, the patient has potential for functional progress which will result in measurable gains while on inpatient rehab.  These gains will be of substantial and practical use upon discharge  in facilitating mobility and self-care at the household level. 5. Physiatrist will provide 24 hour management of medical needs as well as oversight of the therapy plan/treatment and provide guidance as appropriate regarding the interaction of the two. 6. The Preadmission  Screening has been reviewed and patient status is unchanged unless otherwise stated above. 7. 24 hour rehab nursing will assist with safety, skin/wound care, disease management and medication administration  and help integrate therapy concepts, techniques,education, etc. 8. PT will assess and treat for/with: deficits due to cognitive impairment, L BKA, R 5th MTP amputation and ESRD- lack of endurance, locomotion/mobility/safety   Goals are: to get patient to being able to transfer with Supervision, stand short periods of time with RW, and propel w/c Supervision (due to cognition/blindness). 9. OT will assess and treat for/with: L BKA, UB debility due to deficits with endurance, safety, ADLs.   Goals are: increase ability to perform ADLs, toilet transfers, and overall strengthening.. Therapy don't proceed with showering this patient. 10. SLP will assess and treat for/with: n/a, unless OT feels cognition needs addressing.  Goals are: n/a- unless OT feels this needs addressing.. 11. Case Management and Social Worker will assess and treat for psychological issues and discharge planning. 12. Team conference will be held weekly to assess progress toward goals and to determine barriers to discharge. 13. Patient will receive at least 3 hours of therapy per day at least 5 days per week. 14. ELOS: 7-10 days       15. Prognosis:  fair

## 2019-03-18 NOTE — Evaluation (Signed)
Occupational Therapy Assessment and Plan  Patient Details  Name: Timothy Faulkner MRN: [id number] Date of Birth: August 25, 1945  OT Diagnosis: abnormal posture, blindness and low vision, cognitive deficits, muscle weakness (generalized), pain in joint and swelling of limb Rehab Potential:   ELOS: 18-21   Today's Date: 03/18/2019 OT Individual Time: 6144-3154 OT Individual Time Calculation (min): 75 min     Problem List:  Patient Active Problem List   Diagnosis Date Noted  . Unilateral complete BKA, left, initial encounter ([place]) 03/17/2019  . Critical lower limb ischemia 03/12/2019  . Hypotension 02/23/2019  . Cellulitis of left foot 02/23/2019  . Gangrene of toe of left foot ([place]) 02/22/2019  . 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 Ulcer of right foot with fat layer exposed ([place]) 09/02/2018  . Encounter for planned post-operative wound closure   . Acute osteomyelitis of metatarsal bone of right foot ([place])   . Diabetic ulcer of midfoot associated with diabetes mellitus due to underlying condition, with necrosis of bone ([place])   . Osteomyelitis of right foot ([place])   . Vascular calcification   . Cellulitis and abscess of foot, except toes   . Anemia associated with chronic renal failure 07/13/2018  . Cardiomyopathy, dilated, nonischemic ([place]) 07/13/2018  . Diabetic foot infection ([place]) 07/13/2018  . PAD (peripheral artery disease) ([place])   . Macular degeneration   . Chronic systolic heart failure ([place]) 08/22/2014  . Diabetic infection of right foot ([place]) 07/13/2014  . ESRD (end stage renal disease) on dialysis ([place]) 11/11/2013  . Snoring 11/11/2013  . Unspecified sleep apnea 11/11/2013  . Other pancytopenia ([place]) 09/22/2013  . Hemoptysis 09/20/2013  . Pre-transplant evaluation for kidney transplant 01/11/2013  . Automatic implantable cardioverter-defibrillator in situ 10/01/2010  . HYPERCHOLESTEROLEMIA, MIXED 05/03/2010  . Essential hypertension 05/03/2010  .  CHF 05/03/2010    Past Medical History:  Past Medical History:  Diagnosis Date  . Anemia   . Arthritis    Gout  . Automatic implantable cardioverter-defibrillator in situ    [employer]  . CHF   . ESRD on dialysis [facility])    [name] 03/11/2013 (03/11/2013) dialysis M/W/F  . Gangrene ([place])    left foot  . GERD (gastroesophageal reflux disease)   . Gout    "once a year"  . Heart murmur   . HYPERCHOLESTEROLEMIA, MIXED   . HYPERTENSION   . Macular degeneration   . Osteomyelitis ([place])    right foot  . 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 Pacemaker   . Peripheral arterial disease (H[facility])    left fifth toe ulcer, healing  . Pneumonia   . Shortness of breath   . 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 Wears dentures   . Wears glasses    Past Surgical History:  Past Surgical History:  Procedure Laterality Date  . A/V FISTULAGRAM Left 07/20/2018   Procedure: A/V FISTULAGRAM;  Surgeon: [name], MD;  Location: [place] [facility];  Service: Cardiovascular;  Laterality: Left;  . ABDOMINAL AORTOGRAM N/A 02/25/2019   Procedure: ABDOMINAL AORTOGRAM;  Surgeon: Elam Dutch, MD;  Location: [place] [facility];  Service: Cardiovascular;  Laterality: N/A;  . ABDOMINAL AORTOGRAM W/LOWER EXTREMITY Bilateral 07/20/2018   Procedure: ABDOMINAL AORTOGRAM W/LOWER EXTREMITY;  Surgeon: [name], MD;  Location: [place] [facility];  Service: Cardiovascular;  Laterality: Bilateral;  . AMPUTATION Right 09/07/2018   Procedure: Right  fifth metatarsectomy;  Surgeon: [name], DPM;  Location: [place];  Service: Podiatry;  Laterality: Right;  . AMPUTATION Left 03/08/2019   Procedure: AMPUTATION  3RD AND 4TH TOES LEFT FOOT;  Surgeon: [name], DPM;  Location: [place];  Service: Podiatry;  Laterality: Left;  . AMPUTATION Left 03/15/2019   Procedure: AMPUTATION BELOW KNEE;  Surgeon: Elam Dutch, MD;  Location: [facility] OR;  Service:  Vascular;  Laterality: Left;  . AV FISTULA PLACEMENT Right 12/13/2012   Procedure: ARTERIOVENOUS (AV) FISTULA CREATION;  Surgeon: [name], MD;  Location: [place];  Service: Vascular;  Laterality: Right;  Ultrasound guided  . AV FISTULA PLACEMENT Left 05/07/2016   Procedure: LEFT RADIOCEPHALIC ARTERIOVENOUS (AV) FISTULA CREATION;  Surgeon: [name], MD;  Location: [place];  Service: Vascular;  Laterality: Left;  . BASCILIC VEIN TRANSPOSITION Right 03/26/2016   Procedure: RIGHT BASILIC VEIN TRANSPOSITION;  Surgeon: [name], MD;  Location: [place];  Service: Vascular;  Laterality: Right;  . CARDIAC CATHETERIZATION    . CARDIAC DEFIBRILLATOR PLACEMENT     Chemical engineer  . EYE SURGERY Bilateral    Cataract  . FISTULOGRAM Left 04/22/2018   Procedure: FISTULOGRAM UPPER EXTREMITY;  Surgeon: [name], MD;  Location: [place];  Service: Vascular;  Laterality: Left;  . I&D EXTREMITY Right 07/15/2018   Procedure: IRRIGATION AND DEBRIDEMENT RIGHT FOOT;  Surgeon: [name], DPM;  Location: [place];  Service: Podiatry;  Laterality: Right;  . INCISION AND DRAINAGE ABSCESS / HEMATOMA OF BURSA / KNEE / THIGH Left 2012   "knee" (03/11/2013)  . INSERT / REPLACE / REMOVE PACEMAKER    . INSERTION OF DIALYSIS CATHETER Left 04/22/2018   Procedure: INSERTION OF DIALYSIS CATHETER;  Surgeon: [name], MD;  Location: [place];  Service: Vascular;  Laterality: Left;  . IR FLUORO GUIDE CV LINE LEFT  07/15/2018  . IR PTA VENOUS EXCEPT DIALYSIS CIRCUIT  07/15/2018  . LOWER EXTREMITY ANGIOGRAPHY Right 07/21/2018   Procedure: LOWER EXTREMITY ANGIOGRAPHY;  Surgeon: [name], MD;  Location: [place] [facility];  Service: Cardiovascular;  Laterality: Right;  . LOWER EXTREMITY ANGIOGRAPHY Bilateral 02/25/2019   Procedure: Lower Extremity Angiography;  Surgeon: Elam Dutch, MD;  Location: [place] [facility];  Service: Cardiovascular;  Laterality: Bilateral;  . METATARSAL HEAD EXCISION  Right 07/15/2018   Procedure: METATARSAL RESECTION;  Surgeon: [name], DPM;  Location: [place];  Service: Podiatry;  Laterality: Right;  . MULTIPLE TOOTH EXTRACTIONS    . PERIPHERAL VASCULAR ATHERECTOMY Right 02/28/2019   Procedure: PERIPHERAL VASCULAR ATHERECTOMY;  Surgeon: Waynetta Sandy, MD;  Location: [place] [facility];  Service: Cardiovascular;  Laterality: Right;  right tp trunk   . PERIPHERAL VASCULAR BALLOON ANGIOPLASTY Left 02/25/2019   Procedure: PERIPHERAL VASCULAR BALLOON ANGIOPLASTY;  Surgeon: Elam Dutch, MD;  Location: [place] [facility];  Service: Cardiovascular;  Laterality: Left;  tibial peroneal trunk and PT  . PERIPHERAL VASCULAR INTERVENTION Right 07/21/2018   Procedure: PERIPHERAL VASCULAR INTERVENTION;  Surgeon: [name], MD;  Location: [place] [facility];  Service: Cardiovascular;  Laterality: Right;  peroneal stents  . REVISON OF ARTERIOVENOUS FISTULA Right 0/30/1314   Procedure: Plication OF Right Arm ARTERIOVENOUS FISTULA;  Surgeon: Elam Dutch, MD;  Location: [facility] OR;  Service: Vascular;  Laterality: Right;  . REVISON OF ARTERIOVENOUS FISTULA Left 04/22/2018   Procedure: REVISION OF RADIOCEPHALIC ARTERIOVENOUS FISTULA;  Surgeon: [name], MD;  Location: [facility];  Service: Vascular;  Laterality: Left;  . SHUNTOGRAM N/A 05/31/2013   Procedure: Earney Mallet;  Surgeon: [name], MD;  Location: [facility] CATH LAB;  Service: Cardiovascular;  Laterality: N/A;  . UPPER EXTREMITY VENOGRAPHY Right 07/23/2018   Procedure: UPPER EXTREMITY VENOGRAPHY;  Surgeon: [name], MD;  Location: [place] [facility];  Service: Cardiovascular;  Laterality: Right;  . WOUND DEBRIDEMENT Right 07/17/2018   Procedure: Wound Debridement; Closure Filleted toe flap Right Foot;  Surgeon: [name], DPM;  Location: [place];  Service: Podiatry;  Laterality: Right;  . WOUND DEBRIDEMENT Right 09/07/2018   Procedure: Debridement Right Foot Wound,  application of wound vac;  Surgeon: [name], DPM;  Location: [place];  Service: Podiatry;  Laterality: Right;  . WOUND DEBRIDEMENT Right 03/08/2019   Procedure: DEBRIDEMENT WOUND RIGHT FOOT;  Surgeon: [name], DPM;  Location: [place];  Service: Podiatry;  Laterality: Right;    Assessment & Plan Clinical Impression: Timothy Faulkner is a 73 year old male with history of HTN,NICM s/pAICD, ESRD-HD MWF, PVD with multiple bilateral foot wounds s/p recent revascularization and atherectomy with left 3rd and 4th toe amputations with poor healing and was admitted on 03/12/19 with progressive ischemic changes. He was started on IV antibiotics in hopes of limb salvage--wound cultures positive for enterobacter. 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 Oneida Alar. Postop with acute on chronic hypotension as well as ABLA. Therapy evaluations completed revealing functional decline. CIR recommended for follow up therapy.    Said pain was well controlled at rest, however, as soon as lightly touched L BKA wrapping, screamed out loud. Then admitted, probably needed pain meds to tolerate dressing change. Refused to turn over or let me undress L BKA at all. Let me see wound on R foot.     Patient currently requires max with basic self-care skills secondary to muscle weakness, decreased cardiorespiratoy endurance, decreased initiation, decreased safety awareness and delayed processing and decreased sitting balance, decreased standing balance, decreased balance strategies and difficulty maintaining precautions.  Prior to hospitalization, patient could complete BADL with modified independent .  Patient will benefit from skilled intervention to decrease level of assist with basic self-care skills and increase independence with basic self-care skills prior to discharge home with care partner.  Anticipate patient will require 24 hour supervision and minimal  physical assistance and follow up home health.  OT - End of Session Activity Tolerance: Tolerates 30+ min activity with multiple rests Endurance Deficit: Yes OT Assessment Rehab Potential (ACUTE ONLY): Fair OT Patient demonstrates impairments in the following area(s): Balance;Behavior;Cognition;Endurance;Motor;Pain;Safety;Skin Integrity;Perception OT Basic ADL's Functional Problem(s): Grooming;Bathing;Dressing;Toileting OT Transfers Functional Problem(s): Toilet;Tub/Shower OT Plan OT Intensity: Minimum of 1-2 x/day, 45 to 90 minutes OT Frequency: 5 out of 7 days OT Duration/Estimated Length of Stay: 18-21 OT Treatment/Interventions: Balance/vestibular training;Discharge planning;Pain management;Therapeutic Activities;Self Care/advanced ADL retraining;UE/LE Coordination activities;Therapeutic Exercise;Skin care/wound managment;Patient/family education;Functional mobility training;Disease mangement/prevention;Cognitive remediation/compensation;Community reintegration;DME/adaptive equipment instruction;Neuromuscular re-education;Psychosocial support;Splinting/orthotics;UE/LE Strength taining/ROM;Wheelchair propulsion/positioning OT Self Feeding Anticipated Outcome(s): no goal OT Basic Self-Care Anticipated Outcome(s): S UB dressing; MIN A LB dressing OT Toileting Anticipated Outcome(s): MIN A OT Bathroom Transfers Anticipated Outcome(s): S toilet; MIN A shower OT Recommendation Patient destination: Home Follow Up Recommendations: Home health OT Equipment Recommended: Tub/shower bench;Other (comment) Equipment Details: [facility]   Skilled Therapeutic Intervention 1:1. Pt received in bed having not eaten breakfast. Pt completes supine>sit EOB with A for trunk elevation and  VC for sequencing. Pt sits EOB while discussing home set up, d/c planning, goals, and OT role and purpose while pt eats breakfast. tp completes lateral scoot transfer with no board with min increasing to mod Lifting A throughout  session after donning surgical shoe. Pt completes bathing seated in w/c at sink with MAX A sit to stand and standing balance while pt washes buttocks. Pt requires cuing multiple times for hand placement. Pt demo delayed processing and decreased awareness as well as issues with motor planning during transitional movements. Pt able to come to standing with MAX A using both hands on stedy to practice toilet transfer to decrease burden of care on nursing staff as pt demo difficutly and dislike for using lateral leans. Pt educated on use of amputee leg rest to keep hamstring long, hwoever pt unable to achieve full knee extension. Suspect contracture already forming in hamstring. Exited session with pt seated in w/c,c all light in reach and exit alamr on  OT Evaluation Precautions/Restrictions  Precautions Precautions: Fall Restrictions Other Position/Activity Restrictions: NWB LLE General Chart Reviewed: Yes Family/Caregiver Present: No Vital Signs Therapy Vitals Temp: 98.2 F (36.8 C) Pulse Rate: 85 Resp: 16 BP: 114/72 Patient Position (if appropriate): Lying Oxygen Therapy SpO2: 98 % O2 Device: Room Air Pain Pain Assessment Pain Score: 0-No pain Home Living/Prior Functioning Home Living Family/patient expects to be discharged to:: Private residence Living Arrangements: Spouse/significant other Available Help at Discharge: Friend(s), Available 24 hours/day Type of Home: House Home Layout: One level Bathroom Shower/Tub: Architectural technologist: Standard Bathroom Accessibility: Yes Additional Comments: sponge batyhes due to left subclavian dialysis catheter  Lives With: Spouse, Daughter IADL History Homemaking Responsibilities: No Current License: No Mode of Transportation: Family Leisure and Hobbies: golf Prior Function Level of Independence: Independent with basic ADLs, Independent with homemaking with ambulation Driving: No Comments: would use SPC for steadying ADL    Vision Baseline Vision/History: Wears glasses Wears Glasses: At all times(blind in R eye) Patient Visual Report: No change from baseline Perception  Perception: Within Functional Limits Praxis Praxis: Intact Cognition Overall Cognitive Status: Within Functional Limits for tasks assessed Arousal/Alertness: Awake/alert Orientation Level: Person;Place;Situation Person: Oriented Place: Oriented Situation: Oriented Year: 2020 Month: August Day of Week: Correct Immediate Memory Recall: Sock;Blue;Bed Memory Recall Sock: Without Cue Memory Recall Blue: Without Cue Memory Recall Bed: Without Cue Safety/Judgment: Impaired Sensation Sensation Light Touch: Impaired by gross assessment(R toes) Motor  Motor Motor: Abnormal postural alignment and control Motor - Skilled Clinical Observations: generalized weakness Mobility  Bed Mobility Bed Mobility: Supine to Sit Supine to Sit: Moderate Assistance - Patient 50-74% Transfers Sit to Stand: Maximal Assistance - Patient 25-49% Stand to Sit: Maximal Assistance - Patient 25-49%  Trunk/Postural Assessment  Cervical Assessment Cervical Assessment: Exceptions to WFL(head forward) Thoracic Assessment Thoracic Assessment: Exceptions to WFL(rounded shoulders) Lumbar Assessment Lumbar Assessment: Exceptions to WFL(post pelvic tilt) Postural Control Postural Control: Within Functional Limits  Balance   Extremity/Trunk Assessment RUE Assessment RUE Assessment: Exceptions to [facility] General Strength Comments: 4/5 generalized weakness LUE Assessment LUE Assessment: Exceptions to [facility] General Strength Comments: 4/5 generalized weakness     Refer to Care Plan for Long Term Goals  Recommendations for other services: None    Discharge Criteria: Patient will be discharged from OT if patient refuses treatment 3 consecutive times without medical reason, if treatment goals not met, if there is a change in medical status, if patient makes no  progress towards goals or if patient is discharged from hospital.  The  above assessment, treatment plan, treatment alternatives and goals were discussed and mutually agreed upon: by patient  Tonny Branch 03/18/2019, 7:38 AM

## 2019-03-18 NOTE — Progress Notes (Signed)
[facility] PHYSICAL MEDICINE & REHABILITATION PROGRESS NOTE   Subjective/Complaints: Patient reports doing a little better- LBM was yesterday, per pt. Nurse reports they were able to see his residual limb yesterday and this AM and is a little dry, but no drainage; has some bruising, but overall looks good.  Pt c/o chronic eyes watering and needing to dab them "all the time". Also asked who I was although saw him yesterday and asked me to write my name down for him with my "number".   Objective:   No results found. Recent Labs    03/17/19 1627 03/17/19 2341  WBC 7.6 7.8  HGB 9.8* 9.3*  HCT 32.6* 31.4*  PLT 359 351   Recent Labs    03/17/19 0554 03/17/19 1627  NA 133* 132*  K 3.7 4.5  CL 93* 92*  CO2 24 23  GLUCOSE 77 107*  BUN 20 25*  CREATININE 4.53* 5.64*  CALCIUM 8.9 9.1    Intake/Output Summary (Last 24 hours) at 03/18/2019 1230 Last data filed at 03/18/2019 0730 Gross per 24 hour  Intake 100 ml  Output 0 ml  Net 100 ml     Physical Exam: Vital Signs Blood pressure 114/72, pulse 85, temperature 98.2 F (36.8 C), resp. rate 16, height 5\' 8"  (1.727 m), weight 78.8 kg, SpO2 98 %.   Constitutional: He appears well-developed and well-nourished.  Sitting up in manual w/c with nurse at side, L BKA up on legrest set up for amputations. Eyes: EOM are normal. Right eye exhibits discharge. Left eye exhibits discharge.  Eyes moist- kept touching kleenex to eyes for wetness again today Neck: No tracheal deviation present.  Cardiovascular: Normal rate and regular rhythm. Respiratory: Breath sounds normal. No respiratory distress. He has no wheezes. He has no rales. He exhibits no tenderness.  Left chest wall with HD catheter.  Appears with no drainage GI: Soft. Bowel sounds are normal. He exhibits no distension. There is no abdominal tenderness.  Genitourinary:    Penis normal.   Musculoskeletal:     Comments: L-BKA flexed at knee unable to fully extend out. Pain with  minimal movement. Is still at angle, but less than yesterday  Strength 4+/5 in UEs and RLE- however would NOT let me test LLE at all, or touch it  Neurological: He is alert. He displays abnormal reflex (Cannot get DTRs in R patella). No cranial nerve deficit.  Speech clear. Ox2- wasn't sure of name of physician/role of physician, even though seen yesterday. Skin:  Very dry cracking skin on RLE- also large hole where missing R 5th metatarsal- picture in chart  Psychiatric:  Less irritable  Keeps RLE internally rotated. Right foot wound graft C/D with minimal drainage on gauze.     Assessment/Plan: 1. 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  Physiatrist is providing close team supervision and 24 hour management of active medical problems listed below.  Physiatrist and rehab team continue to assess barriers to discharge/monitor patient progress toward functional and medical goals  Care Tool:  Bathing    Body parts bathed by patient: Right arm, Left arm, Chest, Abdomen, Front perineal area, Right upper leg, Left upper leg, Face   Body parts bathed by helper: Buttocks, Right lower leg Body parts n/a: Left lower leg   Bathing assist Assist Level: Moderate Assistance - Patient 50 - 74%  Upper Body Dressing/Undressing Upper body dressing   What is the patient wearing?: Pull over shirt    Upper body assist Assist Level: Supervision/Verbal cueing    Lower Body Dressing/Undressing Lower body dressing      What is the patient wearing?: Pants     Lower body assist Assist for lower body dressing: Maximal Assistance - Patient 25 - 49%     Toileting Toileting Toileting Activity did not occur (Clothing management and hygiene only): N/A (no void or bm)  Toileting assist Assist for toileting: Moderate Assistance - Patient 50 - 74%      Transfers Chair/bed transfer  Transfers assist     Chair/bed transfer assist level: Maximal Assistance - Patient 25 - 49%     Locomotion Ambulation   Ambulation assist              Walk 10 feet activity   Assist           Walk 50 feet activity   Assist           Walk 150 feet activity   Assist           Walk 10 feet on uneven surface  activity   Assist           Wheelchair     Assist               Wheelchair 50 feet with 2 turns activity    Assist            Wheelchair 150 feet activity     Assist         Plan: 1.  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 2.  Antithrombotics: -DVT/anticoagulation:  Pharmaceutical: Heparin             -antiplatelet therapy: Low dose ASA 3. Pain Management: Due to L BKA, phantom pain, residual limb pain, and R MTP amputation residual limb pain, esp after revascularization procedure. Patient is currently on Oxycodone 5mg  q8 hours prn- due to cognitive issues, might not be able to increase, however will try since pain was under such poor control when just touched minimally on LLE today- might benefit from mirror therapy, possibly, however will discuss with PT and OT.  8/7- nursing concerned about oxycodone q8 hours prn- trying to balance cognition and pain control- if needs to, will increase to q6 hours prn.  4. Mood: LCSW to follow for evaluation and support.              -antipsychotic agents: NA 5. Neuropsych: This patient is capable of making decisions on his own behalf at this time, however did appear to be Ox2- to location and self, not time, so will ask staff to assess and might require Neuropsych to assess ability to make decisions in the near future 6. Skin/Wound Care: Monitor right foot wound daily --continue santyl  with damp to dry dressing on. 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 Since unable to see L BKA today due to pt refusal/pain, will con't to monitor and use ACE wrap for edema control and residual limb shaping- also dressing s/p amputation 2 days ago.  7. Fluids/Electrolytes/Nutrition: Strict I/O's although anuric--1200 cc FR/day. Labs with HD- M/W/F-   8/7- LBM yesterday per pt and nurse.  8. ESRD: Continue HD  MWF at end of the day to help with tolerance of therapy. Will not change to T/H/S unless pt cannot tolerate current schedule.  8/7- has HD today after therapy 9. Chronic hypotension with previous hx of HTN: Continue midodrine on HD days. 10 mg dose. 10. PAD/right foot ulcer s/p graft: WBAT with darco shoe? 11. Hypotension: Continue midodrine on HD days.  12. Anemia of chronic disease: Continue to monitor--ESA/Fe per nephrology - current H/H 9.4 and 30.7- stable/mild decrease since surgery- will con't to monitor with labs.  8/7- is stable 13. Hypoalbuminemia: Continue supplement to promote healing.  31. Dispo- will discharge to home, with family.      LOS: 1 days A FACE TO FACE EVALUATION WAS PERFORMED  Emaley Applin 03/18/2019, 12:30 PM

## 2019-03-18 NOTE — Evaluation (Signed)
Physical Therapy Assessment and Plan  Patient Details  Name: Timothy Faulkner MRN: [id number] Date of Birth: [birth year]  PT Diagnosis: Abnormal posture, Abnormality of gait, Cognitive deficits, Contracture of joint: L knee, Difficulty walking, Edema, Impaired sensation, Muscle weakness and Pain in L residual limb and R foot Rehab Potential: Good ELOS: 2-2.5 weeks   Today's Date: 03/18/2019 PT Individual Time: 0900-1000 PT Individual Time Calculation (min): 60 min    Problem List:  Patient Active Problem List   Diagnosis Date Noted  . S/P BKA (below knee amputation), left ([place]) 03/18/2019  . Unilateral complete BKA, left, initial encounter ([place]) 03/17/2019  . Critical lower limb ischemia 03/12/2019  . Hypotension 02/23/2019  . Cellulitis of left foot 02/23/2019  . Gangrene of toe of left foot ([place]) 02/22/2019  . 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 Ulcer of right foot with fat layer exposed ([place]) 09/02/2018  . Encounter for planned post-operative wound closure   . Acute osteomyelitis of metatarsal bone of right foot ([place])   . Diabetic ulcer of midfoot associated with diabetes mellitus due to underlying condition, with necrosis of bone ([place])   . Osteomyelitis of right foot ([place])   . Vascular calcification   . Cellulitis and abscess of foot, except toes   . Anemia associated with chronic renal failure 07/13/2018  . Cardiomyopathy, dilated, nonischemic ([place]) 07/13/2018  . Diabetic foot infection ([place]) 07/13/2018  . PAD (peripheral artery disease) ([place])   . Macular degeneration   . Chronic systolic heart failure ([place]) 08/22/2014  . Diabetic infection of right foot ([place]) 07/13/2014  . ESRD (end stage renal disease) on dialysis ([place]) 11/11/2013  . Snoring 11/11/2013  . Unspecified sleep apnea 11/11/2013  . Other pancytopenia ([place]) 09/22/2013  . Hemoptysis 09/20/2013  . Pre-transplant evaluation for kidney transplant 01/11/2013  . Automatic implantable  cardioverter-defibrillator in situ 10/01/2010  . HYPERCHOLESTEROLEMIA, MIXED 05/03/2010  . Essential hypertension 05/03/2010  . CHF 05/03/2010    Past Medical History:  Past Medical History:  Diagnosis Date  . Anemia   . Arthritis    Gout  . Automatic implantable cardioverter-defibrillator in situ    [employer]  . CHF   . ESRD on dialysis [facility])    [name] 03/11/2013 (03/11/2013) dialysis M/W/F  . Gangrene ([place])    left foot  . GERD (gastroesophageal reflux disease)   . Gout    "once a year"  . Heart murmur   . HYPERCHOLESTEROLEMIA, MIXED   . HYPERTENSION   . Macular degeneration   . Osteomyelitis ([place])    right foot  . 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 Pacemaker   . Peripheral arterial disease (HCC)    left fifth toe ulcer, healing  . Pneumonia   . Shortness of breath   . 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 Wears dentures   . Wears glasses    Past Surgical History:  Past Surgical History:  Procedure Laterality Date  . A/V FISTULAGRAM Left 07/20/2018   Procedure: A/V FISTULAGRAM;  Surgeon: [name], MD;  Location: [place] [facility];  Service: Cardiovascular;  Laterality: Left;  . ABDOMINAL AORTOGRAM N/A 02/25/2019   Procedure: ABDOMINAL AORTOGRAM;  Surgeon: Elam Dutch, MD;  Location: [place] [facility];  Service: Cardiovascular;  Laterality: N/A;  . ABDOMINAL AORTOGRAM W/LOWER EXTREMITY Bilateral 07/20/2018   Procedure: ABDOMINAL AORTOGRAM W/LOWER EXTREMITY;  Surgeon: [name], MD;  Location:  [place] [facility];  Service: Cardiovascular;  Laterality: Bilateral;  . AMPUTATION Right 09/07/2018   Procedure: Right fifth metatarsectomy;  Surgeon: [name], DPM;  Location: [place];  Service: Podiatry;  Laterality: Right;  . AMPUTATION Left 03/08/2019   Procedure: AMPUTATION  3RD AND 4TH TOES LEFT FOOT;  Surgeon: [name], DPM;  Location: [place];  Service: Podiatry;  Laterality:  Left;  . AMPUTATION Left 03/15/2019   Procedure: AMPUTATION BELOW KNEE;  Surgeon: Elam Dutch, MD;  Location: [facility] OR;  Service: Vascular;  Laterality: Left;  . AV FISTULA PLACEMENT Right 12/13/2012   Procedure: ARTERIOVENOUS (AV) FISTULA CREATION;  Surgeon: [name], MD;  Location: [place];  Service: Vascular;  Laterality: Right;  Ultrasound guided  . AV FISTULA PLACEMENT Left 05/07/2016   Procedure: LEFT RADIOCEPHALIC ARTERIOVENOUS (AV) FISTULA CREATION;  Surgeon: [name], MD;  Location: [place];  Service: Vascular;  Laterality: Left;  . BASCILIC VEIN TRANSPOSITION Right 03/26/2016   Procedure: RIGHT BASILIC VEIN TRANSPOSITION;  Surgeon: [name], MD;  Location: [place];  Service: Vascular;  Laterality: Right;  . CARDIAC CATHETERIZATION    . CARDIAC DEFIBRILLATOR PLACEMENT     Chemical engineer  . EYE SURGERY Bilateral    Cataract  . FISTULOGRAM Left 04/22/2018   Procedure: FISTULOGRAM UPPER EXTREMITY;  Surgeon: [name], MD;  Location: [place];  Service: Vascular;  Laterality: Left;  . I&D EXTREMITY Right 07/15/2018   Procedure: IRRIGATION AND DEBRIDEMENT RIGHT FOOT;  Surgeon: [name], DPM;  Location: [place];  Service: Podiatry;  Laterality: Right;  . INCISION AND DRAINAGE ABSCESS / HEMATOMA OF BURSA / KNEE / THIGH Left 2012   "knee" (03/11/2013)  . INSERT / REPLACE / REMOVE PACEMAKER    . INSERTION OF DIALYSIS CATHETER Left 04/22/2018   Procedure: INSERTION OF DIALYSIS CATHETER;  Surgeon: [name], MD;  Location: [place];  Service: Vascular;  Laterality: Left;  . IR FLUORO GUIDE CV LINE LEFT  07/15/2018  . IR PTA VENOUS EXCEPT DIALYSIS CIRCUIT  07/15/2018  . LOWER EXTREMITY ANGIOGRAPHY Right 07/21/2018   Procedure: LOWER EXTREMITY ANGIOGRAPHY;  Surgeon: [name], MD;  Location: [place] [facility];  Service: Cardiovascular;  Laterality: Right;  . LOWER EXTREMITY ANGIOGRAPHY Bilateral 02/25/2019   Procedure: Lower Extremity Angiography;  Surgeon:  Elam Dutch, MD;  Location: [place] [facility];  Service: Cardiovascular;  Laterality: Bilateral;  . METATARSAL HEAD EXCISION Right 07/15/2018   Procedure: METATARSAL RESECTION;  Surgeon: [name], DPM;  Location: [place];  Service: Podiatry;  Laterality: Right;  . MULTIPLE TOOTH EXTRACTIONS    . PERIPHERAL VASCULAR ATHERECTOMY Right 02/28/2019   Procedure: PERIPHERAL VASCULAR ATHERECTOMY;  Surgeon: Waynetta Sandy, MD;  Location: [place] [facility];  Service: Cardiovascular;  Laterality: Right;  right tp trunk   . PERIPHERAL VASCULAR BALLOON ANGIOPLASTY Left 02/25/2019   Procedure: PERIPHERAL VASCULAR BALLOON ANGIOPLASTY;  Surgeon: Elam Dutch, MD;  Location: [place] [facility];  Service: Cardiovascular;  Laterality: Left;  tibial peroneal trunk and PT  . PERIPHERAL VASCULAR INTERVENTION Right 07/21/2018   Procedure: PERIPHERAL VASCULAR INTERVENTION;  Surgeon: [name], MD;  Location: [place] [facility];  Service: Cardiovascular;  Laterality: Right;  peroneal stents  . REVISON OF ARTERIOVENOUS FISTULA Right 1/82/9937   Procedure: Plication OF Right Arm ARTERIOVENOUS FISTULA;  Surgeon: Elam Dutch, MD;  Location: [place];  Service: Vascular;  Laterality: Right;  . REVISON OF  ARTERIOVENOUS FISTULA Left 04/22/2018   Procedure: REVISION OF RADIOCEPHALIC ARTERIOVENOUS FISTULA;  Surgeon: [name], MD;  Location: [place];  Service: Vascular;  Laterality: Left;  . SHUNTOGRAM N/A 05/31/2013   Procedure: Earney Mallet;  Surgeon: [name], MD;  Location: [facility] C[facility] LAB;  Service: Cardiovascular;  Laterality: N/A;  . UPPER EXTREMITY VENOGRAPHY Right 07/23/2018   Procedure: UPPER EXTREMITY VENOGRAPHY;  Surgeon: [name], MD;  Location: [place] [facility];  Service: Cardiovascular;  Laterality: Right;  . WOUND DEBRIDEMENT Right 07/17/2018   Procedure: Wound Debridement; Closure Filleted toe flap Right Foot;  Surgeon: [name], DPM;   Location: [place];  Service: Podiatry;  Laterality: Right;  . WOUND DEBRIDEMENT Right 09/07/2018   Procedure: Debridement Right Foot Wound, application of wound vac;  Surgeon: [name], DPM;  Location: [place];  Service: Podiatry;  Laterality: Right;  . WOUND DEBRIDEMENT Right 03/08/2019   Procedure: DEBRIDEMENT WOUND RIGHT FOOT;  Surgeon: [name], DPM;  Location: [place];  Service: Podiatry;  Laterality: Right;    Assessment & Plan Clinical Impression: Patient is a 73 y.o. year old male  with history of HTN, NICM s/p AICD, ESRD-HD MWF, PVD with multiple bilateral foot wounds s/p recent revascularization and atherectomy with left 3rd and 4th toe amputations with poor healing and was admitted on 03/12/19 with progressive ischemic changes. He was started on IV antibiotics in hopes of limb salvage--wound cultures positive for enterobacter. 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 Oneida Alar. Postop with acute on chronic hypotension as well as ABLA.  Therapy evaluations completed revealing functional decline. CIR recommended for follow up therapy.  Patient transferred to CIR on 03/17/2019 .   Patient currently requires max with mobility secondary to muscle weakness and muscle joint tightness, decreased cardiorespiratoy endurance, decreased problem solving, decreased safety awareness and delayed processing and decreased sitting balance, decreased standing balance, decreased postural control, decreased balance strategies and difficulty maintaining precautions.  Prior to hospitalization, patient was modified independent  with mobility and lived with Spouse, Daughter in a House home.  Home access is  Level entry.  Patient will benefit from skilled PT intervention to maximize safe functional mobility, minimize fall risk and decrease caregiver burden for planned discharge home with 24 hour assist.  Anticipate patient will benefit from follow up [facility] at  discharge.  PT - End of Session Activity Tolerance: Tolerates 30+ min activity with multiple rests Endurance Deficit: Yes Endurance Deficit Description: Requires several rest breaks and increased time to perform all activity PT Assessment Rehab Potential (ACUTE/IP ONLY): Good PT Barriers to Discharge: Wound Care;Weight bearing restrictions PT Barriers to Discharge Comments: Sugical incision L residual limb, wound on R foot, NWB on L LE and heel boot on R LE PT Patient demonstrates impairments in the following area(s): Balance;Behavior;Edema;Endurance;Motor;Nutrition;Perception;Pain;Safety;Sensory;Skin Integrity PT Transfers Functional Problem(s): Bed Mobility;Bed to Chair;Car;Furniture;Floor PT Locomotion Functional Problem(s): Ambulation;Wheelchair Mobility;Stairs PT Plan PT Intensity: Minimum of 1-2 x/day ,45 to 90 minutes PT Frequency: 5 out of 7 days PT Duration Estimated Length of Stay: 2-2.5 weeks PT Treatment/Interventions: Ambulation/gait training;Discharge planning;Functional mobility training;Psychosocial support;Therapeutic Activities;Visual/perceptual remediation/compensation;Balance/vestibular training;Disease management/prevention;Neuromuscular re-education;Skin care/wound management;Therapeutic Exercise;Wheelchair propulsion/positioning;UE/LE Strength taining/ROM;Splinting/orthotics;Pain management;DME/adaptive equipment instruction;Cognitive remediation/compensation;Community reintegration;Functional electrical stimulation;Patient/family education;Stair training;UE/LE Coordination activities PT Transfers Anticipated Outcome(s): supervison lateral scoot using LRAD and min A sit<>stand PT Locomotion Anticipated Outcome(s): supervision w/c mobility 100 feet PT Recommendation Recommendations for Other Services: Neuropsych consult Follow Up Recommendations: Home health  PT Patient destination: Home Equipment Recommended: To be determined Equipment Details: has RW and SPC, will need  w/c (will provided measurements upon further assessment)  Skilled Therapeutic Intervention In addition to the PT evaluation below, the patient performed the following skilled PT interventions: Patient in w/c with residual limb bent on limb pad upon PT arrival. Patient alert and agreeable to PT session. PT assisted patient with removing limb pad and educated about importance of L knee extension and NWB on the surgical site. Attempted to re-wrap patient's limb with ACE wrap for edema control, but patient refused due to increased sensitivity to touch. PT educated on desensitization techniques and provided light rubbing and tapping to residual limb, patient tolerated well, but continued to refuse to allow PT to re-wrap limb.  Patient reported 7/10 L residual limb pain, also reported being pre-medicated prior to session. PT provided repositioning, distraction, and rest breaks for pain management throughout session. Patient called his daughter during session with min A from therapist due to poor vision and discussed bringing shorts and T-shirts for therapy sessions today.  Therapeutic Activity: Bed Mobility: Patient performed rolling R with mod A and L with min A and supine to/from sit with min A with heavy use of rails in a flat bed. Provided verbal cues for reaching with opposite UE and pushing through R LE when rolling L, and protecting residual limb throughout. Transfers: Patient performed a slide board transfer from bed to w/c x1, lateral scoot transfer from w/c to bed x1, and sit to/from stand x1 with RW with mod-max A for all transfers. Required increased time and heavy cues for initiating mobility. Provided verbal cues for board placement, head-hips relationship, hand placement, use of RW, and reaching back to sit from standing. Attempted stand pivot while standing, however patient pushed RW far away and demonstrated difficulty with pivoting on R foot in the heel shoe and patient was not safe to continue  with the transfer and was assisted back to sitting.   Wheelchair Mobility:  Patient propelled wheelchair 75 feet with supervision with significant increased time. Provided verbal cues for increased stroke length for increased speed/efficiency with propulsion and turning technique.  Patient in w/c at end of session with breaks locked, seat belt alarm set, and all needs within reach.   Instructed pt in results of PT evaluation as detailed below, PT POC, rehab potential, rehab goals, and discharge recommendations. Additionally discussed CIR's policies regarding fall safety and use of chair alarm and/or quick release belt. Pt verbalized understanding and in agreement. Will update pt's family members as they become available.   PT Evaluation Precautions/Restrictions Precautions Precautions: Fall Restrictions Other Position/Activity Restrictions: NWB LLE Pain Pain Assessment Pain Score: 3  Home Living/Prior Functioning Home Living Living Arrangements: Spouse/significant other;Children Available Help at Discharge: Friend(s);Available 24 hours/day Type of Home: House Home Access: Level entry Home Layout: One level Bathroom Shower/Tub: Curtain Technical brewer Accessibility: Yes Additional Comments: sponge batyhes due to left subclavian dialysis catheter  Lives With: Spouse;Daughter Prior Function Level of Independence: Independent with basic ADLs;Independent with homemaking with ambulation Driving: No Comments: would use RW to go to HD and [facility] for steadying at home Vision/Perception  Vision - Assessment Additional Comments: Patient legally blind in R eye and blurred vision in L at baseline Perception Perception: Within Functional Limits Praxis Praxis: Intact  Cognition Overall Cognitive Status: No family/caregiver present to determine baseline cognitive functioning Arousal/Alertness: Awake/alert Orientation Level: Oriented X4 Attention:  Focused;Sustained Focused Attention: Impaired Focused Attention Impairment:  Verbal basic;Functional basic Sustained Attention: Impaired Sustained Attention Impairment: Verbal basic;Functional basic Problem Solving: Impaired Problem Solving Impairment: Functional basic Executive Function: Initiating Initiating: Impaired Initiating Impairment: Verbal basic;Functional basic Behaviors: Poor frustration tolerance Safety/Judgment: Impaired Sensation Sensation Light Touch: Impaired by gross assessment(R toes) Coordination Gross Motor Movements are Fluid and Coordinated: No Fine Motor Movements are Fluid and Coordinated: Not tested Coordination and Movement Description: L BKA and generalized weakness with decreased activity tolerance Motor  Motor Motor: Abnormal postural alignment and control;Other (comment) Motor - Skilled Clinical Observations: L BKA and generalized weakness with decreased activity tolerance  Mobility Bed Mobility Bed Mobility: Supine to Sit;Rolling Right;Rolling Left;Sit to Supine Rolling Right: Moderate Assistance - Patient 50-74% Rolling Left: Minimal Assistance - Patient > 75% Supine to Sit: Minimal Assistance - Patient > 75%(with heavy use of bed rails in a flat bed) Sit to Supine: Minimal Assistance - Patient > 75% Transfers Transfers: Sit to Stand;Stand to Sit;Lateral/Scoot Transfers Sit to Stand: Maximal Assistance - Patient 25-49%(with RW) Stand to Sit: Maximal Assistance - Patient 25-49%(with RW) Lateral/Scoot Transfers: Maximal Assistance - Patient 25-49%(with slide board) Transfer (Assistive device): Rolling walker Locomotion  Gait Ambulation: No(Deferred due to decreased activity tolerance and poor safety awareness) Gait Gait: No Stairs / Additional Locomotion Stairs: No Wheelchair Mobility Wheelchair Mobility: Yes Wheelchair Assistance: Chartered loss adjuster: Both upper extremities Wheelchair Parts Management: Needs  assistance Distance: 46'  Trunk/Postural Assessment  Cervical Assessment Cervical Assessment: Exceptions to WFL(head forward) Thoracic Assessment Thoracic Assessment: Exceptions to WFL(rounded shoulders) Lumbar Assessment Lumbar Assessment: Exceptions to WFL(post pelvic tilt) Postural Control Postural Control: Deficits on evaluation(decreased)  Balance Balance Balance Assessed: Yes Static Sitting Balance Static Sitting - Balance Support: Bilateral upper extremity supported;Feet supported Static Sitting - Level of Assistance: 5: Stand by assistance Dynamic Sitting Balance Dynamic Sitting - Balance Support: Right upper extremity supported;Left upper extremity supported Dynamic Sitting - Level of Assistance: 4: Min assist Dynamic Sitting - Balance Activities: Lateral lean/weight shifting;Forward lean/weight shifting;Reaching for objects Static Standing Balance Static Standing - Balance Support: Bilateral upper extremity supported Static Standing - Level of Assistance: 3: Mod assist Extremity Assessment  RUE Assessment RUE Assessment: Exceptions to [facility] Active Range of Motion (AROM) Comments: WFL for functional mobility General Strength Comments: 4/5 generalized weakness LUE Assessment LUE Assessment: Exceptions to [facility] Active Range of Motion (AROM) Comments: WFL for functional mobility General Strength Comments: 4/5 generalized weakness RLE Assessment RLE Assessment: Exceptions to [facility] Active Range of Motion (AROM) Comments: hip flexion and knee flexion/extension WFL for functional mobility, unable to fully assess ankle ROM due to heel shoe General Strength Comments: Grossly at least 3+/5 with functional mobility LLE Assessment LLE Assessment: Exceptions to [facility] Passive Range of Motion (PROM) Comments: Decreased knee extension lacking >10 degrees Active Range of Motion (AROM) Comments: Decreased knee extension lacking >10 degrees General Strength Comments: Grossly at least 3-/5  throughout, unable to fully assess due to increased pain in residual limb to light pressure    Refer to Care Plan for Long Term Goals  Recommendations for other services: Neuropsych  Discharge Criteria: Patient will be discharged from PT if patient refuses treatment 3 consecutive times without medical reason, if treatment goals not met, if there is a change in medical status, if patient makes no progress towards goals or if patient is discharged from hospital.  The above assessment, treatment plan, treatment alternatives and goals were discussed and mutually agreed upon: by patient  Pang Robers L Jalessa Peyser PT, DPT  03/18/2019, 2:02 PM

## 2019-03-19 DIAGNOSIS — N186 End stage renal disease: Secondary | ICD-10-CM

## 2019-03-19 DIAGNOSIS — D638 Anemia in other chronic diseases classified elsewhere: Secondary | ICD-10-CM

## 2019-03-19 DIAGNOSIS — Z992 Dependence on renal dialysis: Secondary | ICD-10-CM

## 2019-03-19 DIAGNOSIS — Z89512 Acquired absence of left leg below knee: Secondary | ICD-10-CM

## 2019-03-19 NOTE — Plan of Care (Signed)
  Problem: RH PAIN MANAGEMENT Goal: RH STG PAIN MANAGED AT OR BELOW PT'S PAIN GOAL Description: Less than 3 Outcome: Not Progressing; low pain tolerance esp lt bka pt shouts with slight touch   Problem: RH KNOWLEDGE DEFICIT LIMB LOSS Goal: RH STG INCREASE KNOWLEDGE OF SELF CARE AFTER LIMB LOSS Outcome: Not Progressing; low pain tolerance

## 2019-03-19 NOTE — Progress Notes (Signed)
[place] PHYSICAL MEDICINE & REHABILITATION PROGRESS NOTE   Subjective/Complaints: No new complaints today. A little sore after therapies.   ROS: Patient denies fever, rash, sore throat, blurred vision, nausea, vomiting, diarrhea, cough, shortness of breath or chest pain,  headache, or mood change.    Objective:   No results found. Recent Labs    03/17/19 2341 03/18/19 1339  WBC 7.8 7.4  HGB 9.3* 9.0*  HCT 31.4* 29.9*  PLT 351 372   Recent Labs    03/17/19 1627 03/18/19 1339  NA 132* 131*  K 4.5 4.0  CL 92* 91*  CO2 23 25  GLUCOSE 107* 89  BUN 25* 36*  CREATININE 5.64* 7.08*  CALCIUM 9.1 9.1    Intake/Output Summary (Last 24 hours) at 03/19/2019 0919 Last data filed at 03/19/2019 0110 Gross per 24 hour  Intake 100 ml  Output 462 ml  Net -362 ml     Physical Exam: Vital Signs Blood pressure 114/61, pulse (!) 104, temperature (!) 97.5 F (36.4 C), temperature source Oral, resp. rate 18, height 5\' 8"  (1.727 m), weight 78.7 kg, SpO2 95 %.   Constitutional: No distress . Vital signs reviewed. HEENT: EOMI, oral membranes moist Neck: supple Cardiovascular: RRR without murmur. No JVD    Respiratory: CTA Bilaterally without wheezes or rales. Normal effort,  Catheter left chest GI: BS +, non-tender, non-distended  Genitourinary:    Penis normal.   Musculoskeletal:     Comments: left bka with edema, can't fully extend Neuro: alert and oriented. Strength 4+/5 RUE and RLE, LLE limited by pain  Skin:  Right Foot dry, 5th toe amp site clean,intact. skin otherwise intact RLE, left BK incision dressed Psychiatric:  flat    Assessment/Plan: 1. 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  Physiatrist is providing close team supervision and 24 hour management of active medical problems listed below.  Physiatrist and rehab  team continue to assess barriers to discharge/monitor patient progress toward functional and medical goals  Care Tool:  Bathing    Body parts bathed by patient: Right arm, Left arm, Chest, Abdomen, Front perineal area, Right upper leg, Left upper leg, Face   Body parts bathed by helper: Buttocks, Right lower leg Body parts n/a: Left lower leg   Bathing assist Assist Level: Moderate Assistance - Patient 50 - 74%     Upper Body Dressing/Undressing Upper body dressing   What is the patient wearing?: Pull over shirt    Upper body assist Assist Level: Supervision/Verbal cueing    Lower Body Dressing/Undressing Lower body dressing      What is the patient wearing?: Pants     Lower body assist Assist for lower body dressing: Maximal Assistance - Patient 25 - 49%     Toileting Toileting Toileting Activity did not occur (Clothing management and hygiene only): N/A (no void or bm)  Toileting assist Assist for toileting: Moderate Assistance - Patient 50 - 74%     Transfers Chair/bed transfer  Transfers assist     Chair/bed transfer assist level: Maximal Assistance - Patient 25 - 49%     Locomotion Ambulation   Ambulation assist   Ambulation activity did not occur: Safety/medical concerns(decreased strength/activity tolerance)          Walk 10 feet activity   Assist  Walk 10 feet activity did not occur: Safety/medical concerns(decreased strength/activity tolerance)  Walk 50 feet activity   Assist Walk 50 feet with 2 turns activity did not occur: Safety/medical concerns(decreased strength/activity tolerance)         Walk 150 feet activity   Assist Walk 150 feet activity did not occur: Safety/medical concerns(decreased strength/activity tolerance)         Walk 10 feet on uneven surface  activity   Assist Walk 10 feet on uneven surfaces activity did not occur: Safety/medical concerns(decreased strength/activity tolerance)          Wheelchair     Assist Will patient use wheelchair at discharge?: Yes Type of Wheelchair: Manual    Wheelchair assist level: Set up assist, Supervision/Verbal cueing Max wheelchair distance: 37'    Wheelchair 50 feet with 2 turns activity    Assist        Assist Level: Set up assist, Supervision/Verbal cueing   Wheelchair 150 feet activity     Assist Wheelchair 150 feet activity did not occur: Safety/medical concerns(decreased strength/activity tolerance)       Plan: 1.  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  --Continue CIR therapies including PT, OT  2.  Antithrombotics: -DVT/anticoagulation:  Pharmaceutical: Heparin             -antiplatelet therapy: Low dose ASA 3. Pain Management: Due to L BKA, phantom pain, residual limb pain, and R MTP amputation residual limb pain, esp after revascularization procedure.  Patient is currently on Oxycodone 5mg  q8 hours prn-try to limit due to neuro-sedating effects. Increase if he absolutely needs it     4. Mood: LCSW to follow for evaluation and support.              -antipsychotic agents: NA 5. Neuropsych: This patient is capable of making decisions on his own behalf at this time,  -follow for cognition  -consider neuro-psych assessment  -?baseline 6. Skin/Wound Care: Monitor right foot wound daily --continue santyl with damp to dry dressing on. 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 S  -left BKA: dry dressing + ACE wrap for edema control and residual limb shaping-   7. Fluids/Electrolytes/Nutrition: Strict I/O's although anuric--1200 cc FR/day. Labs with HD- M/W/F-   - had bm today  8. ESRD: Continue HD MWF at end of the day to help with tolerance of therapy. Will not change to T/H/S unless pt cannot tolerate current schedule.  887-  has HD today after therapy 9. Chronic hypotension with previous hx of HTN: Continue midodrine on HD days. 10 mg dose. 10. PAD/right foot ulcer s/p graft: WBAT with darco shoe? 11. Hypotension: Continue midodrine on HD days.  12. Anemia of chronic disease: Continue to monitor--ESA/Fe per nephrology - current H/H 9.4 and 30.7- stable/mild decrease since surgery- will con't to monitor with labs.  8/8- is stable 13. Hypoalbuminemia: Continue supplement to promote healing.  85. Dispo- will discharge to home, with family.      LOS: 2 days A FACE TO FACE EVALUATION WAS PERFORMED  Meredith Staggers 03/19/2019, 9:19 AM

## 2019-03-20 ENCOUNTER — Inpatient Hospital Stay (HOSPITAL_COMMUNITY): Payer: Medicare Other

## 2019-03-20 MED ORDER — CHLORHEXIDINE GLUCONATE CLOTH 2 % EX PADS
6.0000 | MEDICATED_PAD | Freq: Every day | CUTANEOUS | Status: DC
  Administered 2019-03-23 – 2019-03-31 (×8): 6 via TOPICAL

## 2019-03-20 NOTE — IPOC Note (Signed)
Overall Plan of Care [facility]) Patient Details Name: Timothy Faulkner MRN: [id number] DOB: [birth year]  Admitting Diagnosis: S/P BKA (below knee amputation), left [facility])  Hospital Problems: Principal Problem:   S/P BKA (below knee amputation), left (HCC) Active Problems:   Unilateral complete BKA, left, initial encounter ([place])     Functional Problem List: Nursing Endurance, Motor, Skin Integrity, Safety  PT Balance, Behavior, Edema, Endurance, Motor, Nutrition, Perception, Pain, Safety, Sensory, Skin Integrity  OT Balance, Behavior, Cognition, Endurance, Motor, Pain, Safety, Skin Integrity, Perception  SLP    TR         Basic ADL's: OT Grooming, Bathing, Dressing, Toileting     Advanced  ADL's: OT       Transfers: PT Bed Mobility, Bed to Chair, Car, [employer], Futures trader, Metallurgist: PT Ambulation, Emergency planning/management officer, Stairs     Additional Impairments: OT    SLP        TR      Anticipated Outcomes Item Anticipated Outcome  Self Feeding no goal  Swallowing      Basic self-care  S UB dressing; MIN A LB dressing  Toileting  MIN A   Bathroom Transfers S toilet; MIN A shower  Bowel/Bladder  Continent to bowel and bladder with min. assist.  Transfers  supervison lateral scoot using LRAD and min A sit<>stand  Locomotion  supervision w/c mobility 100 feet  Communication     Cognition     Pain  Less than 3,on 1 to 10 scale  Safety/Judgment  Free from falls during his stay in rehab   Therapy Plan: PT Intensity: Minimum of 1-2 x/day ,45 to 90 minutes PT Frequency: 5 out of 7 days PT Duration Estimated Length of Stay: 2-2.5 weeks OT Intensity: Minimum of 1-2 x/day, 45 to 90 minutes OT Frequency: 5 out of 7 days OT Duration/Estimated Length of Stay: 18-21     Due to the current state of emergency, patients may not be receiving their 3-hours of Medicare-mandated therapy.   Team Interventions: Nursing Interventions Patient/Family  Education, Pain Management, Skin Care/Wound Management  PT interventions Ambulation/gait training, Discharge planning, Functional mobility training, Psychosocial support, Therapeutic Activities, Visual/perceptual remediation/compensation, Balance/vestibular training, Disease management/prevention, Neuromuscular re-education, Skin care/wound management, Therapeutic Exercise, Wheelchair propulsion/positioning, UE/LE Strength taining/ROM, Splinting/orthotics, Pain management, DME/adaptive equipment instruction, Cognitive remediation/compensation, Community reintegration, Functional electrical stimulation, Patient/family education, IT trainer, UE/LE Coordination activities  OT Interventions Training and development officer, Discharge planning, Pain management, Therapeutic Activities, Self Care/advanced ADL retraining, UE/LE Coordination activities, Therapeutic Exercise, Skin care/wound managment, Patient/family education, Functional mobility training, Disease mangement/prevention, Cognitive remediation/compensation, Academic librarian, Engineer, drilling, Neuromuscular re-education, Psychosocial support, Splinting/orthotics, UE/LE Strength taining/ROM, Wheelchair propulsion/positioning  SLP Interventions    TR Interventions    SW/CM Interventions Discharge Planning, Psychosocial Support, Patient/Family Education   Barriers to Discharge MD  Medical stability and Wound care  Nursing      PT Wound Care, Weight bearing restrictions Sugical incision L residual limb, wound on R foot, NWB on L LE and heel boot on R LE  OT      SLP      SW       Team Discharge Planning: Destination: PT-Home ,OT- Home , SLP-  Projected Follow-up: PT-Home health PT, OT-  Home health OT, SLP-  Projected Equipment Needs: PT-To be determined, OT- Tub/shower bench, Other (comment), SLP-  Equipment Details: PT-has RW and SPC, will need w/c (will provided measurements upon further assessment),  OT-DAC Patient/family involved in discharge  planning: PT- Patient,  OT-Patient, SLP-   MD ELOS: 18-20 days Medical Rehab Prognosis:  Excellent Assessment: The patient has been admitted for CIR therapies with the diagnosis of left BKA. The team will be addressing functional mobility, strength, stamina, balance, safety, adaptive techniques and equipment, self-care, bowel and bladder mgt, patient and caregiver education, wound care, pain mgt, pre-prosthetic training. Goals have been set at supervision to min assist with basic mobility and self-care. Pain inhibiting at times.   Due to the current state of emergency, patients may not be receiving their 3 hours per day of Medicare-mandated therapy.    Meredith Staggers, MD, FAAPMR      See Team Conference Notes for weekly updates to the plan of care

## 2019-03-20 NOTE — Progress Notes (Signed)
[place] PHYSICAL MEDICINE & REHABILITATION PROGRESS NOTE   Subjective/Complaints: Pt states that he slept well. left stump still tender.   ROS: Patient denies fever, rash, sore throat, blurred vision, nausea, vomiting, diarrhea, cough, shortness of breath or chest pain, joint or back pain, headache, or mood change.   Objective:   No results found. Recent Labs    03/17/19 2341 03/18/19 1339  WBC 7.8 7.4  HGB 9.3* 9.0*  HCT 31.4* 29.9*  PLT 351 372   Recent Labs    03/17/19 1627 03/18/19 1339  NA 132* 131*  K 4.5 4.0  CL 92* 91*  CO2 23 25  GLUCOSE 107* 89  BUN 25* 36*  CREATININE 5.64* 7.08*  CALCIUM 9.1 9.1    Intake/Output Summary (Last 24 hours) at 03/20/2019 0921 Last data filed at 03/19/2019 1857 Gross per 24 hour  Intake 240 ml  Output -  Net 240 ml     Physical Exam: Vital Signs Blood pressure (!) 112/58, pulse 84, temperature 97.8 F (36.6 C), temperature source Oral, resp. rate 14, height 5\' 8"  (1.727 m), weight 77.7 kg, SpO2 97 %.   Constitutional: No distress . Vital signs reviewed. obese HEENT: EOMI, oral membranes moist Neck: supple Cardiovascular: RRR without murmur. No JVD    Respiratory: CTA Bilaterally without wheezes or rales. Normal effort    GI: BS +, non-tender, non-distended  Genitourinary:    Penis normal.   Musculoskeletal:     Comments: left bk tender, keeps flexed about 20 degrees Neuro: alert and oriented. Strength 4+/5 RUE and RLE, LLE limited by pain  Skin:  Right Foot dry, 5th toe amp site clean,intact. skin otherwise intact RLE, left BK incision CDI with staples.  Psychiatric:  flat    Assessment/Plan: 1. 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  Physiatrist is providing close team supervision and 24 hour management of active medical problems listed below.  Physiatrist and  rehab team continue to assess barriers to discharge/monitor patient progress toward functional and medical goals  Care Tool:  Bathing    Body parts bathed by patient: Right arm, Left arm, Chest, Abdomen, Front perineal area, Right upper leg, Left upper leg   Body parts bathed by helper: Buttocks, Right lower leg Body parts n/a: Right lower leg, Left lower leg   Bathing assist Assist Level: Moderate Assistance - Patient 50 - 74%     Upper Body Dressing/Undressing Upper body dressing   What is the patient wearing?: Pull over shirt    Upper body assist Assist Level: Set up assist    Lower Body Dressing/Undressing Lower body dressing      What is the patient wearing?: Pants     Lower body assist Assist for lower body dressing: Moderate Assistance - Patient 50 - 74%     Toileting Toileting Toileting Activity did not occur (Clothing management and hygiene only): N/A (no void or bm)  Toileting assist Assist for toileting: Moderate Assistance - Patient 50 - 74%     Transfers Chair/bed transfer  Transfers assist     Chair/bed transfer assist level: Maximal Assistance - Patient 25 - 49%     Locomotion Ambulation   Ambulation assist   Ambulation activity did not occur: Safety/medical concerns(decreased strength/activity tolerance)          Walk 10 feet activity   Assist  Walk 10 feet activity  did not occur: Safety/medical concerns(decreased strength/activity tolerance)        Walk 50 feet activity   Assist Walk 50 feet with 2 turns activity did not occur: Safety/medical concerns(decreased strength/activity tolerance)         Walk 150 feet activity   Assist Walk 150 feet activity did not occur: Safety/medical concerns(decreased strength/activity tolerance)         Walk 10 feet on uneven surface  activity   Assist Walk 10 feet on uneven surfaces activity did not occur: Safety/medical concerns(decreased strength/activity tolerance)          Wheelchair     Assist Will patient use wheelchair at discharge?: Yes Type of Wheelchair: Manual    Wheelchair assist level: Set up assist, Supervision/Verbal cueing Max wheelchair distance: 59'    Wheelchair 50 feet with 2 turns activity    Assist        Assist Level: Set up assist, Supervision/Verbal cueing   Wheelchair 150 feet activity     Assist Wheelchair 150 feet activity did not occur: Safety/medical concerns(decreased strength/activity tolerance)       Plan: 1.  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  --Continue CIR therapies including PT, OT  2.  Antithrombotics: -DVT/anticoagulation:  Pharmaceutical: Heparin             -antiplatelet therapy: Low dose ASA 3. Pain Management: Due to L BKA, phantom pain, residual limb pain, and R MTP amputation residual limb pain, esp after revascularization procedure.  Patient is currently on Oxycodone 5mg  q8 hours prn-try to limit due to neuro-sedating effects.   Increase if he absolutely needs it   Overall seems to be managing better  Discussed importance of edema mgt/snug ACE wrap/manual desensitization, etc to help decreased stump pain.      4. Mood: LCSW to follow for evaluation and support.              -antipsychotic agents: NA 5. Neuropsych: This patient is capable of making decisions on his own behalf at this time,  -follow for cognition  -consider neuro-psych assessment  -?baseline 6. Skin/Wound Care: Monitor right foot wound daily --continue santyl with damp to dry dressing on. 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 S  -left BKA: dry dressing removed and examined. Continue ACE wrap for edema control and residual limb shaping   -wound looks good, should be ready for shrinker soon  7. Fluids/Electrolytes/Nutrition: Strict  I/O's although anuric--1200 cc FR/day. Labs with HD- M/W/F-   - had bm 8/8  8. ESRD: Continue HD MWF at end of the day to help with tolerance of therapy. Will not change to T/H/S unless pt cannot tolerate current schedule.    9. Chronic hypotension with previous hx of HTN: Continue midodrine on HD days. 10 mg dose. 10. PAD/right foot ulcer s/p graft: WBAT with darco shoe? 11. Hypotension: Continue midodrine on HD days.  12. Anemia of chronic disease: Continue to monitor--ESA/Fe per nephrology - current H/H 9.4 and 30.7- stable/mild decrease since surgery- will con't to monitor with labs.  -follow up labs with HD 13. Hypoalbuminemia: Continue supplement to promote healing.  9. Dispo- will discharge to home, with family.      LOS: 3 days A FACE TO FACE EVALUATION WAS PERFORMED  Meredith Staggers 03/20/2019, 9:21 AM

## 2019-03-20 NOTE — Progress Notes (Signed)
Physical Therapy Session Note  Patient Details  Name: Timothy Faulkner MRN: [id number] Date of Birth: Jan 06, 1946  Today's Date: 03/20/2019 PT Individual Time: 1015-1100 and 1435-1535 PT Individual Time Calculation (min): 45 and 60 min   Short Term Goals: Week 1:  PT Short Term Goal 1 (Week 1): Patient will perform bed mobiltiy with min A without rails. PT Short Term Goal 2 (Week 1): Patient will perform transfers from bed<>chair with mod A consistently. PT Short Term Goal 3 (Week 1): Patient will perform sit<>stand with mod A. PT Short Term Goal 4 (Week 1): Patient will tolerate and begin education on residual limb wrapping and edema control.  Skilled Therapeutic Interventions/Progress Updates:     Session 1: Patient in bed upon PT arrival. Patient alert and agreeable to PT session, however, reporting to 10/10 residual limb pain and only willing to perform bed level exercises. RN provided pain medicine during session an PT provided relaxation, repositioning, distraction, and desensitization techniques and pain interventions during session. Provided patient with pain neuroscience education during session, patient accespted education and stated understanding. He could not tolerate light touch from PT to his L residual limb from mid thigh to end, however he did tolerate air passing over his skin and sheets touching his skin. He initiated light touch to his residual limb and was able to tolerate light touch and tapping for desensitization. He then progressed to rubbing his residual limb and reported some decrease in pain. PT re-attempted to perform light touch to patient's limb and he remained unable to tolerate it at this time. Patient's L knee remained in a flexed position throughout. PT educated on importance of promoting knee extension and joint mobility. Patient attempted to manually assist his L knee into extension and cried out in pain and stated he could not tolerate it right now. PT elevated LE  on a pillow with patient lifting his residual limb and repositioned him in the bed with mod A. Then PT provided guided imagery and relaxation techniques with minimal relief in pain per patient report. Patient stated that he was much more comfotable and would like to stay in this position for a while until the pain goes down. Patient missed 15 min of skilled PT due to pain. Patient was very verbose during session this morning discussing past stories about living in [place]. Required max A for redirection to therapeutic activities. Patient in bed with breaks locked, bed alarm set, and all needs in reach at end of session. RN made aware of patient's pain and sensitivity in residual limb.   Session 2: Patient asleep in bed upon PT arrival. Patient easily aroused and agreeable to PT session, however, reported 10/10 L residual limb pain despite being pre-medicated prior to session. PT provided repositioning, distraction, and rest breaks during session for pain interventions.   Therapeutic Activity: Bed Mobility: Patient performed supine to sit with min A with the HOB elevated and use of bed rails with significantly increased time. Provided verbal cues for rolling to R to push up and scooting forward to the EOB for increased balance. Transfers: Patient performed lateral scoot transfer to the R to the w/c with min A and significantly increased time and encouragement. Provided verbal cues for hand placement, head-hips relationship, and lifting his bottom when scooting. He performed sit to/from stand x2 with mod A using the RW. Provided verbal cues for hand placement (R hand pushing up form arm rest, L hand on RW for increased weight shift to the R to  stand), leaning forward, and placing R foot underneath his COG. Patient was noted to be incontinent of bowl during transfers. Required total A for peri-care and LB dressing during standing.   Neuromuscular Re-ed: Patient performed sitting balance sitting EOB x10  min. Initially required CGA-min A due to posterior lean. Tends to lift R foot off the ground. Progressed to supervision with leaning slightly outside of BOS to grasp a tissue from PT. PT donned R heel shoe with total A with patient sitting EOB.   Therapeutic Exercise: Patient performed the following exercises with verbal and tactile cues for proper technique. -L hip flexion x10 -L knee extension x10 within available range  Patient in w/c at end of session with breaks locked, seat belt alarm set, and all needs within reach.    Therapy Documentation Precautions:  Precautions Precautions: Fall Restrictions Weight Bearing Restrictions: No Other Position/Activity Restrictions: NWB LLE General: PT Amount of Missed Time (min): 15 Minutes PT Missed Treatment Reason: Pain    Therapy/Group: Individual Therapy  Guillermo Difrancesco L Jacoya Bauman PT, DPT  03/20/2019, 4:49 PM

## 2019-03-20 NOTE — Plan of Care (Signed)
  Problem: RH PAIN MANAGEMENT Goal: RH STG PAIN MANAGED AT OR BELOW PT'S PAIN GOAL Description: Less than 3 Outcome: Not Progressing; pain med every 4 hours; shouts  when left leg is moved or touched even with light touch.

## 2019-03-20 NOTE — Progress Notes (Addendum)
[facility] Progress Note   Subjective:   Patient seen in room. Reports he is doing well, no new concerns. Denies sob, cough, CP, palpitations, abdominal pain, N/V/D, edema.   Objective Vitals:   03/19/19 0525 03/19/19 1232 03/19/19 1958 03/20/19 0555  BP: 114/61 104/73 91/60 (!) 112/58  Pulse: (!) 104 88 84 84  Resp: 18 19 16 14   Temp: (!) 97.5 F (36.4 C) 98.2 F (36.8 C) 98.1 F (36.7 C) 97.8 F (36.6 C)  TempSrc: Oral  Oral Oral  SpO2: 95% 99% 98% 97%  Weight:    77.7 kg  Height:       Physical Exam General:Well developed male, sleepy but in NAD Heart:RRR, no murmurs rubs or gallops Lungs:CTA bilaterally without wheezing, rhonchi or rales Abdomen:Soft, non-tender, non-distended. +BS Extremities:No edema. LBKA stump wrapped. Dialysis Access:Right IJ TDC,no erythema/drainage  Additional Objective Labs: Basic Metabolic Panel: Recent Labs  Lab 03/17/19 0554 03/17/19 1627 03/18/19 1339  NA 133* 132* 131*  K 3.7 4.5 4.0  CL 93* 92* 91*  CO2 24 23 25   GLUCOSE 77 107* 89  BUN 20 25* 36*  CREATININE 4.53* 5.64* 7.08*  CALCIUM 8.9 9.1 9.1  PHOS 2.8 2.6 3.0   Liver Function Tests: Recent Labs  Lab 03/17/19 1627 03/18/19 1339  ALBUMIN 2.8* 2.7*   CBC: Recent Labs  Lab 03/16/19 0427 03/17/19 0554 03/17/19 1627 03/17/19 2341 03/18/19 1339  WBC 9.8 7.9 7.6 7.8 7.4  NEUTROABS 7.7 5.4  --   --   --   HGB 10.8* 9.4* 9.8* 9.3* 9.0*  HCT 36.4* 30.7* 32.6* 31.4* 29.9*  MCV 100.0 99.7 99.7 100.0 99.3  PLT 346 338 359 351 372   Blood Culture    Component Value Date/Time   SDES BLOOD RIGHT HAND 03/12/2019 1450   SPECREQUEST  03/12/2019 1450    BOTTLES DRAWN AEROBIC AND ANAEROBIC Blood Culture results may not be optimal due to an inadequate volume of blood received in culture bottles   CULT  03/12/2019 1450    NO GROWTH 5 DAYS Performed at [facility], [place] [address] [place], [place] [address]    REPTSTATUS 03/17/2019 FINAL  03/12/2019 1450    CBG: Recent Labs  Lab 03/16/19 1124 03/16/19 1628 03/16/19 2039 03/17/19 0656 03/17/19 1120  GLUCAP 135* 119* 127* 68* 87   Medications:  . aspirin EC  81 mg Oral Daily  . calcitRIOL  0.5 mcg Oral Q M,W,F-HD  . Chlorhexidine Gluconate Cloth  6 each Topical Q0600  . Chlorhexidine Gluconate Cloth  6 each Topical Q0600  . cinacalcet  30 mg Oral QODAY  . collagenase  1 application Topical Daily  . docusate sodium  100 mg Oral Daily  . feeding supplement (PRO-STAT SUGAR FREE 64)  30 mL Oral BID  . ferric citrate  420 mg Oral TID WC  . ferric citrate  420 mg Oral With snacks  . heparin  5,000 Units Subcutaneous Q8H  . midodrine  10 mg Oral Q M,W,F-HD  . multivitamin  1 tablet Oral QHS  . rosuvastatin  10 mg Oral q1800    Dialysis Orders: 4h 400/800 78kg 3K/2.5Ca bat Hep 5000 +2000 midrun R IJ TDC Calcitriol 0.5  no Mircera or Fe Recent labs: iPTH 248 Ca/P ok hgb 11.8 31% sat   Assessment/Plan: 1. PAD with critical limb ischemia - prior revascularization in July; seen by [facility] - no further vascular intervention available to improve circulation. 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 On CIR now.  2.  Enterobacter wound culture 7/28 left foot - completed course of Zosyn and Vanc - afebrile, s/ BKA 3. ESRD- MWF HD.  Holding heparin postop.HD tomorrow. Lower edw as tolerated post BKA.  4. Hypertension/volume- using midodrine for BP support.BP stable today. No edema or SOB. 5. Anemia-hgb9.0- declined post op, continue aranesp 6. Metabolic bone disease-Calcium 9.1, corrected 10.1. Lowered dose of calcitriol.  Continue sensipar/Auryxia. Phos 3.0- previously decreased auryxia to 1/meal.  7. NutritionAlbumin 2.7. Continue supplement. Renal diet with fluid restrictions recommended. 8. DM- Per primary 9. Hx NICM s/p ICD 10.  Anice Paganini, PA-C 03/20/2019, 1:51 PM  [employer] Pager: [phone]  Pt seen, examined and agree w A/P as above.  [name]  MD 03/20/2019, 3:43 PM

## 2019-03-20 NOTE — Progress Notes (Signed)
Occupational Therapy Session Note  Patient Details  Name: Timothy Faulkner MRN: [id number] Date of Birth: 1945-10-31  Today's Date: 03/20/2019 OT Individual Time: 0700-0810 OT Individual Time Calculation (min): 70 min    Short Term Goals: Week 1:  OT Short Term Goal 1 (Week 1): Pt will sit to stand consistently with MOD A for prep of clothing managment OT Short Term Goal 2 (Week 1): Pt will thread BLE into pants wiht S and AE PRN OT Short Term Goal 3 (Week 1): Pt will transfer to [facility] with MOD A and LRAD  Skilled Therapeutic Interventions/Progress Updates:    Pt resting in bed upon arrival and agreeable to therapy.  Pt sat EOB to eat breakfast before transferring to w/c for bathing/dressing with sit<>stand from w/c.  OT intervention with focus on sit<>stand, functional tranfsers, standing balance, educaiton, activity tolerance, and safety awareness to increase independence with BADLs.  Pt required mod A for squat pivot transfer bed>w/c.  Pt required mod A for sit<>stand at sink and mod A for standing balance while pt bathed buttocks.  Pt required assistance pulling pants over hips while standing at sink.  Pt unable to assist with donning shoe on R foot.  Pt required max A for squat pivot transfer w/c>bed with max verbal cues for sequencing. Pt requires more than a reasonable amount of time to complete all tasks.  Pt yells out when touching LLE both above and below knee.  Pt unable to fully extend L knee. Pt remained in bed with all needs within reach and bed alarm activated.  RN notifed of pt's request for pain meds.   Therapy Documentation Precautions:  Precautions Precautions: Fall Restrictions Weight Bearing Restrictions: No Other Position/Activity Restrictions: NWB LLE Pain: Pain Assessment Pain Scale: 0-10 Pain Score: 8  Pain Type: Surgical pain Pain Location: Leg Pain Orientation: Left Pain Descriptors / Indicators: Sharp Pain Frequency: Intermittent Pain Onset: Sudden Pain  Intervention(s):RN notified of request for Meds; repositioned   Therapy/Group: Individual Therapy  Leroy Libman 03/20/2019, 8:11 AM

## 2019-03-21 ENCOUNTER — Inpatient Hospital Stay (HOSPITAL_COMMUNITY): Payer: Medicare Other | Admitting: Occupational Therapy

## 2019-03-21 ENCOUNTER — Inpatient Hospital Stay (HOSPITAL_COMMUNITY): Payer: Medicare Other

## 2019-03-21 ENCOUNTER — Telehealth: Payer: Self-pay | Admitting: *Deleted

## 2019-03-21 LAB — RENAL FUNCTION PANEL
Albumin: 2.6 g/dL — ABNORMAL LOW (ref 3.5–5.0)
Anion gap: 14 (ref 5–15)
BUN: 43 mg/dL — ABNORMAL HIGH (ref 8–23)
CO2: 28 mmol/L (ref 22–32)
Calcium: 8.9 mg/dL (ref 8.9–10.3)
Chloride: 92 mmol/L — ABNORMAL LOW (ref 98–111)
Creatinine, Ser: 8.11 mg/dL — ABNORMAL HIGH (ref 0.61–1.24)
GFR calc Af Amer: 7 mL/min — ABNORMAL LOW (ref >60)
GFR calc non Af Amer: 6 mL/min — ABNORMAL LOW (ref >60)
Glucose, Bld: 89 mg/dL (ref 70–99)
Phosphorus: 2.9 mg/dL (ref 2.5–4.6)
Potassium: 4.3 mmol/L (ref 3.5–5.1)
Sodium: 134 mmol/L — ABNORMAL LOW (ref 135–145)

## 2019-03-21 LAB — CBC
HCT: 30.2 % — ABNORMAL LOW (ref 39.0–52.0)
Hemoglobin: 8.9 g/dL — ABNORMAL LOW (ref 13.0–17.0)
MCH: 30 pg (ref 26.0–34.0)
MCHC: 29.5 g/dL — ABNORMAL LOW (ref 30.0–36.0)
MCV: 101.7 fL — ABNORMAL HIGH (ref 80.0–100.0)
Platelets: 365 10*3/uL (ref 150–400)
RBC: 2.97 MIL/uL — ABNORMAL LOW (ref 4.22–5.81)
RDW: 18 % — ABNORMAL HIGH (ref 11.5–15.5)
WBC: 8.2 10*3/uL (ref 4.0–10.5)
nRBC: 0 % (ref 0.0–0.2)

## 2019-03-21 MED ORDER — OXYCODONE HCL 5 MG PO TABS
5.0000 mg | ORAL_TABLET | Freq: Four times a day (QID) | ORAL | Status: DC | PRN
  Administered 2019-03-22 – 2019-03-29 (×16): 5 mg via ORAL
  Filled 2019-03-21 (×18): qty 1

## 2019-03-21 MED ORDER — HEPARIN SODIUM (PORCINE) 1000 UNIT/ML IJ SOLN
INTRAMUSCULAR | Status: AC
  Filled 2019-03-21: qty 4

## 2019-03-21 MED ORDER — TRAMADOL HCL 50 MG PO TABS
50.0000 mg | ORAL_TABLET | Freq: Two times a day (BID) | ORAL | Status: DC
  Administered 2019-03-21 – 2019-03-31 (×21): 50 mg via ORAL
  Filled 2019-03-21 (×21): qty 1

## 2019-03-21 NOTE — Telephone Encounter (Signed)
Pt's dtr, [name] called states she has a question.

## 2019-03-21 NOTE — Progress Notes (Signed)
Physical Therapy Session Note  Patient Details  Name: Timothy Faulkner MRN: [id number] Date of Birth: 05-Feb-1946  Today's Date: 03/21/2019 PT Individual Time:  -      Short Term Goals: Week 1:  PT Short Term Goal 1 (Week 1): Patient will perform bed mobiltiy with min A without rails. PT Short Term Goal 2 (Week 1): Patient will perform transfers from bed<>chair with mod A consistently. PT Short Term Goal 3 (Week 1): Patient will perform sit<>stand with mod A. PT Short Term Goal 4 (Week 1): Patient will tolerate and begin education on residual limb wrapping and edema control.  Skilled Therapeutic Interventions/Progress Updates:     Patient in bed upon PT arrival. Patient alert and agreeable to PT session, however, reporting to 10/10 residual limb pain and cried out in pain when PT lightly touched his residual limb. He was unable to specify the exact location of the pain. RN made aware, but it was not time for him to have any pain medicine during our session. PT provided relaxation, repositioning, distraction, and desensitization techniques as pain interventions during session. Provided patient with pain neuroscience education during session, patient accespted education and stated understanding. Initially he could not tolerate light touch from PT to his L residual limb from mid thigh to end. Encouraged the patient to iinitiated light touch to his residual limb and was able to tolerate light touch and rubbing for desensitization. He then progressed to allowing PT to perform light touch and progress to rubbing/massage to his residual limb and reported some decrease in pain after. Patient's L knee remained in a flexed position throughout. PT educated on importance of promoting knee extension and joint mobility. Patient provided gentle manually assist his L knee into extension and he tolerated 2 min of stretching x2 with PT with one hand above his knee and the other on the superior part of his calf for  support. Patient was able to lift his L leg with SLR x5 with gentle assist from PT before he cried out in pain and asked to take a break. Patient became very lethargic and required verbal stimulus to arouse x2. Patient stated he needed to rest, that the pain was too much to do any more. PT elevated B LEs on pillows for cmfort with patient lifting his residual limb and repositioned him in the bed with mod A. Then PT provided guided imagery and relaxation techniques for pain management x4-5 min. Patient stated that he was much more comfotable and would like to stay in this position for a while until the pain goes down. Patient missed 15 min of skilled PT due to pain. Patient in bed with breaks locked, bed alarm set, and all needs in reach at end of session. RN made aware of patient's pain and sensitivity in residual limb.    Therapy Documentation Precautions:  Precautions Precautions: Fall Restrictions Weight Bearing Restrictions: No Other Position/Activity Restrictions: NWB LLE   Therapy/Group: Individual Therapy  Shaquira Moroz L Tiney Zipper PT, DPT  03/21/2019, 7:33 AM

## 2019-03-21 NOTE — Progress Notes (Signed)
Occupational Therapy Session Note  Patient Details  Name: Timothy Faulkner MRN: [id number] Date of Birth: 07-11-1946  Today's Date: 03/21/2019 OT Individual Time: 1107-1200 OT Individual Time Calculation (min): 53 min    Short Term Goals: Week 1:  OT Short Term Goal 1 (Week 1): Pt will sit to stand consistently with MOD A for prep of clothing managment OT Short Term Goal 2 (Week 1): Pt will thread BLE into pants wiht S and AE PRN OT Short Term Goal 3 (Week 1): Pt will transfer to [facility] with MOD A and LRAD  Skilled Therapeutic Interventions/Progress Updates:    Pt received in bed sleeping. His daughter stated he had been sleeping for 45 minutes.  Upon waking he was quite alert and engaging well.  He was very focused on pain and even screamed out in pain before moving or being touch.  To distract pt, had him rub lotion into his R leg and then on his L.  Moved pt into supine and had him work on AROM of R hip/knee flexion 10x and then pt was able to move onto his L.  No c/o intense pain just discomfort.   Pt moves very slowly and needs to be redirected at times. He was then able to do active knee extension of L, rolling onto L and R with actively lifting L leg and pushing with R heel using his head turns to guide movement.  Scooted up in bed and sat pt upright.  L hip abd/ add.  Pt was then able to gently rub his L residual limb.  Pt resting in bed with bed alarm on and dtr in the room. Pt relaxed and comfortable.   Therapy Documentation Precautions:  Precautions Precautions: Fall Restrictions Weight Bearing Restrictions: No Other Position/Activity Restrictions: NWB LLE      Pain: Pain Assessment Pain Scale: 0-10 Pain Score: 3  - at rest in LLE.  Increased pain with activity but resumed to 3/10 at end of session      Therapy/Group: Individual Therapy  [place] 03/21/2019, 12:31 PM

## 2019-03-21 NOTE — Telephone Encounter (Signed)
Left message to call again to discuss pt.

## 2019-03-21 NOTE — Telephone Encounter (Signed)
They will once we remove the graft. I am planning on removing the graft later this week. Can you let her know?
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
## 2019-03-21 NOTE — Progress Notes (Signed)
[facility] Progress Note   Subjective:   Patient seen in room. 1 episode of shooting pain at amputation site. Denies sob, cough, CP, palpitations, abdominal pain, N/V/D, edema.   Objective Vitals:   03/20/19 1534 03/20/19 2002 03/21/19 0504 03/21/19 0658  BP: (!) 91/47 96/60 116/72   Pulse: 89 89 85   Resp: 18 16 18    Temp: 97.7 F (36.5 C) 98.4 F (36.9 C) 97.9 F (36.6 C)   TempSrc: Oral Oral Oral   SpO2: 100% 99% 95%   Weight:    81.1 kg  Height:       Physical Exam General:comfortable at 45 degrees in bed Heart:RRR, no murmurs rubs or gallops Lungs:CTA bilaterally without wheezing, rhonchi or rales Abdomen:Soft, non-tender, non-distended. +BS Extremities:No edema. LBKA stump wrapped. Dialysis Access:Right IJ TDC,no erythema/drainage  Additional Objective Labs: Basic Metabolic Panel: Recent Labs  Lab 03/17/19 0554 03/17/19 1627 03/18/19 1339  NA 133* 132* 131*  K 3.7 4.5 4.0  CL 93* 92* 91*  CO2 24 23 25   GLUCOSE 77 107* 89  BUN 20 25* 36*  CREATININE 4.53* 5.64* 7.08*  CALCIUM 8.9 9.1 9.1  PHOS 2.8 2.6 3.0   Liver Function Tests: Recent Labs  Lab 03/17/19 1627 03/18/19 1339  ALBUMIN 2.8* 2.7*   CBC: Recent Labs  Lab 03/16/19 0427 03/17/19 0554 03/17/19 1627 03/17/19 2341 03/18/19 1339  WBC 9.8 7.9 7.6 7.8 7.4  NEUTROABS 7.7 5.4  --   --   --   HGB 10.8* 9.4* 9.8* 9.3* 9.0*  HCT 36.4* 30.7* 32.6* 31.4* 29.9*  MCV 100.0 99.7 99.7 100.0 99.3  PLT 346 338 359 351 372   Blood Culture    Component Value Date/Time   SDES BLOOD RIGHT HAND 03/12/2019 1450   SPECREQUEST  03/12/2019 1450    BOTTLES DRAWN AEROBIC AND ANAEROBIC Blood Culture results may not be optimal due to an inadequate volume of blood received in culture bottles   CULT  03/12/2019 1450    NO GROWTH 5 DAYS Performed at [facility], [place] [address], [place], [place] [address]    REPTSTATUS 03/17/2019 FINAL 03/12/2019 1450    CBG: Recent Labs   Lab 03/16/19 1124 03/16/19 1628 03/16/19 2039 03/17/19 0656 03/17/19 1120  GLUCAP 135* 119* 127* 68* 87   Medications:  . aspirin EC  81 mg Oral Daily  . calcitRIOL  0.5 mcg Oral Q M,W,F-HD  . Chlorhexidine Gluconate Cloth  6 each Topical Q0600  . Chlorhexidine Gluconate Cloth  6 each Topical Q0600  . cinacalcet  30 mg Oral QODAY  . collagenase  1 application Topical Daily  . docusate sodium  100 mg Oral Daily  . feeding supplement (PRO-STAT SUGAR FREE 64)  30 mL Oral BID  . ferric citrate  420 mg Oral TID WC  . ferric citrate  420 mg Oral With snacks  . heparin  5,000 Units Subcutaneous Q8H  . midodrine  10 mg Oral Q M,W,F-HD  . multivitamin  1 tablet Oral QHS  . rosuvastatin  10 mg Oral q1800    Dialysis Orders: 4h 400/800 78kg 3K/2.5Ca bat Hep 5000 +2000 midrun R IJ TDC Calcitriol 0.5  no Mircera or Fe Recent labs: iPTH 248 Ca/P ok hgb 11.8 31% sat   Assessment/Plan: 1. PAD with critical limb ischemia - prior revascularization in July; seen by [facility] - no further vascular intervention available to improve circulation. 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 On CIR now. ?shooting pain phantom limb type of pain.  CTM.  2. Enterobacter wound culture 7/28 left foot - completed course of Zosyn and Vanc - afebrile, s/ BKA 3. ESRD- MWF HD.  Holding heparin postop.HD today. Lower EDW as tolerated post BKA. UF goal 2.5L today.  Asked him to do standing weight after treatment if able.  4. Hypertension/volume- using midodrine for BP support.BP stable today. No edema or SOB. 5. Anemia-hgb9.0 8/10- down post op, continue aranesp. Check with HD today. 6. Metabolic bone disease-Calcium 9.1, corrected 10.1. Lowered dose of calcitriol.  Continue sensipar/Auryxia. Phos 3.0- previously decreased auryxia to 1/meal.  Check today with HD.  7. NutritionAlbumin 2.7. Continue supplement. Renal diet with fluid restrictions recommended. 8. DM- Per primary 9. Hx NICM s/p ICD  Jannifer Hick  MD [facility] Pager [phone]

## 2019-03-21 NOTE — Telephone Encounter (Signed)
Pt's dtr, [name] asked if the hospital needs to continue the santyl on the graft foot.

## 2019-03-21 NOTE — Progress Notes (Signed)
Occupational Therapy Session Note  Patient Details  Name: [name] MRN: [id number] Date of Birth: 1945-11-26  Today's Date: 03/21/2019 OT Individual Time: 0700-0810 OT Individual Time Calculation (min): 70 min    Short Term Goals: Week 1:  OT Short Term Goal 1 (Week 1): Pt will sit to stand consistently with MOD A for prep of clothing managment OT Short Term Goal 2 (Week 1): Pt will thread BLE into pants wiht S and AE PRN OT Short Term Goal 3 (Week 1): Pt will transfer to [facility] with MOD A and LRAD  Skilled Therapeutic Interventions/Progress Updates:    Pt asleep in bed upon arrival but easily aroused.  Pt agreeable to getting OOB and "starting his day." Pt somewhat lethargic today and required increased verbal cues for transfers. Supine>sit EOB with bed rails.  Pt required max A for all squat pivot transfers and mod A for sit<>stand X 3 at sink.  Pt required mod A for standing balance at sink. Pt also leans against sink for support.  Pt required max A for managing pants this morning.  Pt with flat affect today; less animated. Pt requested to return to bed at end of session.  Pt reported no pain this morning and allowed therapist to provide input on LLE proximal to knee to facilitate increased extension.  Pt remained in bed with all needs within reach and bed alarm activated.   Therapy Documentation Precautions:  Precautions Precautions: Fall Restrictions Weight Bearing Restrictions: No Other Position/Activity Restrictions: NWB LLE Pain:   Pt denies pain this morning   Therapy/Group: Individual Therapy  Leroy Libman 03/21/2019, 8:11 AM

## 2019-03-22 ENCOUNTER — Inpatient Hospital Stay (HOSPITAL_COMMUNITY): Payer: Medicare Other | Admitting: Occupational Therapy

## 2019-03-22 ENCOUNTER — Inpatient Hospital Stay (HOSPITAL_COMMUNITY): Payer: Medicare Other

## 2019-03-22 ENCOUNTER — Inpatient Hospital Stay (HOSPITAL_COMMUNITY): Payer: Medicare Other | Admitting: Physical Therapy

## 2019-03-22 LAB — IRON AND TIBC
Iron: 32 ug/dL — ABNORMAL LOW (ref 45–182)
Saturation Ratios: 23 % (ref 17.9–39.5)
TIBC: 140 ug/dL — ABNORMAL LOW (ref 250–450)
UIBC: 108 ug/dL

## 2019-03-22 LAB — CBC
HCT: 29.2 % — ABNORMAL LOW (ref 39.0–52.0)
Hemoglobin: 8.5 g/dL — ABNORMAL LOW (ref 13.0–17.0)
MCH: 29.6 pg (ref 26.0–34.0)
MCHC: 29.1 g/dL — ABNORMAL LOW (ref 30.0–36.0)
MCV: 101.7 fL — ABNORMAL HIGH (ref 80.0–100.0)
Platelets: 299 10*3/uL (ref 150–400)
RBC: 2.87 MIL/uL — ABNORMAL LOW (ref 4.22–5.81)
RDW: 17.9 % — ABNORMAL HIGH (ref 11.5–15.5)
WBC: 8.2 10*3/uL (ref 4.0–10.5)
nRBC: 0 % (ref 0.0–0.2)

## 2019-03-22 LAB — FERRITIN: Ferritin: 864 ng/mL — ABNORMAL HIGH (ref 24–336)

## 2019-03-22 MED ORDER — DARBEPOETIN ALFA 60 MCG/0.3ML IJ SOSY
60.0000 ug | PREFILLED_SYRINGE | INTRAMUSCULAR | Status: DC
  Administered 2019-03-23: 60 ug via INTRAVENOUS
  Filled 2019-03-22: qty 0.3

## 2019-03-22 MED ORDER — COLLAGENASE 250 UNIT/GM EX OINT
TOPICAL_OINTMENT | Freq: Every day | CUTANEOUS | Status: DC
  Administered 2019-03-22 – 2019-03-31 (×10): via TOPICAL
  Filled 2019-03-22: qty 30

## 2019-03-22 MED ORDER — METHOCARBAMOL 500 MG PO TABS
500.0000 mg | ORAL_TABLET | Freq: Two times a day (BID) | ORAL | Status: DC | PRN
  Administered 2019-03-22 – 2019-03-29 (×6): 500 mg via ORAL
  Filled 2019-03-22 (×9): qty 1

## 2019-03-22 MED ORDER — SODIUM CHLORIDE 0.9 % IV SOLN
125.0000 mg | INTRAVENOUS | Status: AC
  Administered 2019-03-23 – 2019-03-30 (×4): 125 mg via INTRAVENOUS
  Filled 2019-03-22 (×8): qty 10

## 2019-03-22 NOTE — Progress Notes (Signed)
Occupational Therapy Session Note  Patient Details  Name: NOLYN SWAB MRN: [id number] Date of Birth: 1946-03-04  Today's Date: 03/22/2019 OT Individual Time: 1345-1400 OT Individual Time Calculation (min): 15 min  and Today's Date: 03/22/2019 OT Missed Time: 30 Minutes Missed Time Reason: Pain;Patient fatigue   Short Term Goals: Week 1:  OT Short Term Goal 1 (Week 1): Pt will sit to stand consistently with MOD A for prep of clothing managment OT Short Term Goal 2 (Week 1): Pt will thread BLE into pants wiht S and AE PRN OT Short Term Goal 3 (Week 1): Pt will transfer to [facility] with MOD A and LRAD  Skilled Therapeutic Interventions/Progress Updates:    Pt asleep in bed with RLE off EOB upon arrival.  Pt aroused with min verbal cues.  Pt stated he had a rough morning and was "worn out." In addition, his LLE was "hurting bad." Pt requested to rest.  Pt required max A for repositioning in bed.  Max A for bed mobility,  Pt remained in bed with all needs within reach and bed alarm activated.   Therapy Documentation Precautions:  Precautions Precautions: Fall Restrictions Weight Bearing Restrictions: No Other Position/Activity Restrictions: NWB LLE General: General OT Amount of Missed Time: 30 Minutes  Pain: Pt c/o 7/10 LLE pain; repsoitoned Therapy/Group: Individual Therapy  Leroy Libman 03/22/2019, 2:27 PM

## 2019-03-22 NOTE — Telephone Encounter (Signed)
I informed pt's dtr, [name] of Dr. Eleanora Neighbor statement that once the graft was removed the [facility] applications would be started. [name] states the nurses have been putting Santyl on the foot, and she asked the nurse on Sunday to check if needed to be continued.

## 2019-03-22 NOTE — Progress Notes (Addendum)
  Subjective:  Patient ID: MINNIE SHI, male    DOB: 09/20/1945,  MRN: [id number]  Seen in IPR. Doing we ll. Working with therapy. Denies complaints. Objective:   Vitals:   03/22/19 0410 03/22/19 1434  BP: (!) 109/57 (!) 112/46  Pulse: 82 83  Resp: 17 20  Temp: 98 F (36.7 C) 98.5 F (36.9 C)  SpO2: 96% 97%   Right foot wound healing well, graft removed underlying tissue healing with 80% granular wound base.  Left BKA dressing intact. Assessment & Plan:  Patient was evaluated and treated and all questions answered.  Right foot wound -Healing well. Wound graft removed. -Dry dressing applied today. -Order Santly WTD daily -Will look into possibility of reapplying wound graft while patient is in IPR. -WBAT in surgical shoe -Continue to work on strengthening for LLE BKA. -Will continue to follow. -Would benefit from semi-electric hospital bed for home use. Would benefit due to high risk of pressure ulcerations given LLE BKA, right foot ulcers. Patient requires frequent position changes to prevent ulcerations. He has difficulty adjusting position on his own due to BKA left leg. He will need this for 99 months.  [name], DPM  Accessible via secure chat for questions or concerns.

## 2019-03-22 NOTE — Progress Notes (Signed)
[facility] PHYSICAL MEDICINE & REHABILITATION PROGRESS NOTE   Subjective/Complaints: Pt reports  He's doing "all right"- pain getting better since yesterday- not sure if it's due ot new regimen or pain just "healing up".      ROS: Patient denies fever, rash, sore throat, blurred vision, nausea, vomiting, diarrhea, cough, shortness of breath or chest pain, joint or back pain, headache, or mood change.   Objective:   No results found. Recent Labs    03/21/19 1545 03/22/19 1116  WBC 8.2 8.2  HGB 8.9* 8.5*  HCT 30.2* 29.2*  PLT 365 299   Recent Labs    03/21/19 1545 03/22/19 1116  NA 134* 134*  K 4.3 3.8  CL 92* 96*  CO2 28 24  GLUCOSE 89 82  BUN 43* 24*  CREATININE 8.11* 5.23*  CALCIUM 8.9 9.0    Intake/Output Summary (Last 24 hours) at 03/22/2019 1626 Last data filed at 03/22/2019 1433 Gross per 24 hour  Intake 20 ml  Output 1300 ml  Net -1280 ml     Physical Exam: Vital Signs Blood pressure (!) 112/46, pulse 83, temperature 98.5 F (36.9 C), temperature source Oral, resp. rate 20, height 5\' 8"  (1.727 m), weight 75.6 kg, SpO2 97 %.   Constitutional: sitting up in bed, appears more comfortable, asking for ice from physician, and an ink pen, NAD HEENT: EOMI, oral membranes moist- Eyes still watering, which is chronic- dabbing with kleenex as usual Cardiovascular: RRR without murmur, rubs, or gallops Respiratory: CTA Bilaterally without wheezes or rales. Normal effort    GI: BS +, non-tender, non-distended, soft Genitourinary:    Penis normal.   Musculoskeletal:     Comments: left bk tender, keeps flexed about 15- 20 degrees Neuro: alert and oriented x2 -didn't remember doctor again . Strength 4+/5 RUE and RLE, LLE limited by pain  Skin:  Right Foot dry, 5th toe amp site clean,intact. skin otherwise intact RLE, left BK incision CDI with staples.  Psychiatric:  Flat,     Assessment/Plan: 1. 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  Physiatrist is providing close team supervision and 24 hour management of active medical problems listed below.  Physiatrist and rehab team continue to assess barriers to discharge/monitor patient progress toward functional and medical goals  Care Tool:  Bathing    Body parts bathed by patient: Right arm, Left arm, Abdomen, Chest, Front perineal area, Left upper leg, Right upper leg   Body parts bathed by helper: Buttocks Body parts n/a: Right lower leg, Left lower leg   Bathing assist Assist Level: Moderate Assistance - Patient 50 - 74%     Upper Body Dressing/Undressing Upper body dressing   What is the patient wearing?: Pull over shirt    Upper body assist Assist Level: Set up assist    Lower Body Dressing/Undressing Lower body dressing      What is the patient wearing?: Incontinence brief     Lower body assist Assist for lower body dressing: Maximal Assistance - Patient 25 - 49%     Toileting Toileting Toileting Activity did not occur (Clothing management and hygiene only): N/A (no void or bm)  Toileting assist Assist for toileting: Moderate Assistance - Patient 50 - 74%     Transfers Chair/bed transfer  Transfers assist     Chair/bed transfer assist level: Minimal Assistance - Patient > 75%  Locomotion Ambulation   Ambulation assist   Ambulation activity did not occur: Safety/medical concerns(decreased strength/activity tolerance)          Walk 10 feet activity   Assist  Walk 10 feet activity did not occur: Safety/medical concerns(decreased strength/activity tolerance)        Walk 50 feet activity   Assist Walk 50 feet with 2 turns activity did not occur: Safety/medical concerns(decreased strength/activity tolerance)         Walk 150 feet activity   Assist Walk 150 feet activity did not occur:  Safety/medical concerns(decreased strength/activity tolerance)         Walk 10 feet on uneven surface  activity   Assist Walk 10 feet on uneven surfaces activity did not occur: Safety/medical concerns(decreased strength/activity tolerance)         Wheelchair     Assist Will patient use wheelchair at discharge?: Yes Type of Wheelchair: Manual    Wheelchair assist level: Set up assist, Supervision/Verbal cueing Max wheelchair distance: 72'    Wheelchair 50 feet with 2 turns activity    Assist        Assist Level: Set up assist, Supervision/Verbal cueing   Wheelchair 150 feet activity     Assist Wheelchair 150 feet activity did not occur: Safety/medical concerns(decreased strength/activity tolerance)       Plan: 1.  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  --Continue CIR therapies including PT, OT  2.  Antithrombotics: -DVT/anticoagulation:  Pharmaceutical: Heparin due to ESRD on HD             -antiplatelet therapy: Low dose ASA 3. Pain Management: Due to L BKA, phantom pain, residual limb pain, and R MTP amputation residual limb pain, esp after revascularization procedure.  Patient is currently on Oxycodone 5mg  q8 hours prn-try to limit due to neuro-sedating effects. Increase if he absolutely needs it   Overall seems to be managing better  Discussed importance of edema mgt/snug ACE wrap/manual desensitization, etc to help decreased stump pain.   8/10- will increase Oxycodone to 5 mg q6 hours prn and add a little tramadol 50 mg BID for more steady pain control   8/11- pain better controlled per pt and nursing. Con't new regimen.  4. Mood: LCSW to follow for evaluation and support.              -antipsychotic agents: NA 5. Neuropsych: This patient is capable of making decisions on his own behalf at this  time,  -follow for cognition  -consider neuro-psych assessment  -?baseline 6. Skin/Wound Care: Monitor right foot wound daily --continue santyl with damp to dry dressing on. 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 S  -left BKA: dry dressing removed and examined. Continue ACE wrap for edema control and residual limb shaping   -wound looks good, should be ready for shrinker soon  7. Fluids/Electrolytes/Nutrition: Strict I/O's although anuric--1200 cc FR/day. Labs with HD- M/W/F-   - had bm 8/8  8/10- BM 8/9, per pt.  8. ESRD: Continue HD MWF at end of the day to help with tolerance of therapy. Will not change to T/H/S unless pt cannot tolerate current schedule.    9. Chronic hypotension with previous hx of HTN: Continue midodrine on HD days. 10 mg dose. 10. PAD/right foot ulcer s/p graft: WBAT with  darco shoe? 11. Hypotension: Continue midodrine on HD days.  12. Anemia of chronic disease: Continue to monitor--ESA/Fe per nephrology - current H/H 9.4 and 30.7- stable/mild decrease since surgery- will con't to monitor with labs.  -follow up labs with HD 13. Hypoalbuminemia: Continue supplement to promote healing.  74. Dispo- will discharge to home, with family.      LOS: 5 days A FACE TO FACE EVALUATION WAS PERFORMED  Amaiya Scruton 03/22/2019, 4:26 PM

## 2019-03-22 NOTE — Progress Notes (Signed)
Social Work Patient ID: Timothy Faulkner, male   DOB: November 23, 1945, 73 y.o.   MRN: [id number]     Diagnosis codes: Z89.512, N18.6  Height:  5'8              Weight:  174 lbs         Patient suffers from L-BKA  which impairs his ability to perform daily activities like ADL's and tolieting   in the home.  A walker  will not resolve issue with performing activities of daily living.  A wheelchair will allow patient to safely perform daily activities.  Patient is not able to propel themselves in the home using a standard weight wheelchair due to endurance and fatigue .  Patient can self propel in the lightweight wheelchair.

## 2019-03-22 NOTE — Progress Notes (Addendum)
[facility] Progress Note   Subjective:   Patient seen in room. Still with stump pain - CIR MD increasing pain regimen.  Said HD went fine yesterday. Denies sob, cough, CP, palpitations, abdominal pain, N/V/D, edema.   Objective Vitals:   03/21/19 1630 03/21/19 1650 03/21/19 1924 03/22/19 0410  BP: 108/70 97/60 (!) 102/53 (!) 109/57  Pulse: (!) 101 99 91 82  Resp:  17 16 17   Temp:  (!) 97.2 F (36.2 C) (!) 97.5 F (36.4 C) 98 F (36.7 C)  TempSrc:  Oral Oral Oral  SpO2:  97% 94% 96%  Weight:  77.4 kg  75.6 kg  Height:       Physical Exam General:comfortable lying flat in bed.  Heart:RRR, no murmurs rubs or gallops Lungs:CTA bilaterally without wheezing, rhonchi or rales Abdomen:Soft, non-tender, non-distended. +BS Extremities:No edema. LBKA stump wrapped. Dialysis Access:Right IJ TDC,no erythema/drainage  Additional Objective Labs: Basic Metabolic Panel: Recent Labs  Lab 03/17/19 1627 03/18/19 1339 03/21/19 1545  NA 132* 131* 134*  K 4.5 4.0 4.3  CL 92* 91* 92*  CO2 23 25 28   GLUCOSE 107* 89 89  BUN 25* 36* 43*  CREATININE 5.64* 7.08* 8.11*  CALCIUM 9.1 9.1 8.9  PHOS 2.6 3.0 2.9   Liver Function Tests: Recent Labs  Lab 03/17/19 1627 03/18/19 1339 03/21/19 1545  ALBUMIN 2.8* 2.7* 2.6*   CBC: Recent Labs  Lab 03/16/19 0427 03/17/19 0554 03/17/19 1627 03/17/19 2341 03/18/19 1339 03/21/19 1545  WBC 9.8 7.9 7.6 7.8 7.4 8.2  NEUTROABS 7.7 5.4  --   --   --   --   HGB 10.8* 9.4* 9.8* 9.3* 9.0* 8.9*  HCT 36.4* 30.7* 32.6* 31.4* 29.9* 30.2*  MCV 100.0 99.7 99.7 100.0 99.3 101.7*  PLT 346 338 359 351 372 365   Blood Culture    Component Value Date/Time   SDES BLOOD RIGHT HAND 03/12/2019 1450   SPECREQUEST  03/12/2019 1450    BOTTLES DRAWN AEROBIC AND ANAEROBIC Blood Culture results may not be optimal due to an inadequate volume of blood received in culture bottles   CULT  03/12/2019 1450    NO GROWTH 5 DAYS Performed at  [facility], [place] [address], [place],  [address]    REPTSTATUS 03/17/2019 FINAL 03/12/2019 1450    CBG: Recent Labs  Lab 03/16/19 1124 03/16/19 1628 03/16/19 2039 03/17/19 0656 03/17/19 1120  GLUCAP 135* 119* 127* 68* 87   Medications:  . aspirin EC  81 mg Oral Daily  . calcitRIOL  0.5 mcg Oral Q M,W,F-HD  . Chlorhexidine Gluconate Cloth  6 each Topical Q0600  . Chlorhexidine Gluconate Cloth  6 each Topical Q0600  . cinacalcet  30 mg Oral QODAY  . collagenase  1 application Topical Daily  . docusate sodium  100 mg Oral Daily  . feeding supplement (PRO-STAT SUGAR FREE 64)  30 mL Oral BID  . ferric citrate  420 mg Oral TID WC  . ferric citrate  420 mg Oral With snacks  . heparin  5,000 Units Subcutaneous Q8H  . midodrine  10 mg Oral Q M,W,F-HD  . multivitamin  1 tablet Oral QHS  . rosuvastatin  10 mg Oral q1800  . traMADol  50 mg Oral Q12H    Dialysis Orders: 4h 400/800 78kg 3K/2.5Ca bat Hep 5000 +2000 midrun R IJ TDC Calcitriol 0.5  no Mircera or Fe Recent labs: iPTH 248 Ca/P ok hgb 11.8 31% sat   Assessment/Plan: 1. PAD  with critical limb ischemia - prior revascularization in July; seen by [facility] - no further vascular intervention available to improve circulation. 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 On CIR now. ?shooting pain phantom limb type of pain.  CTM.  2. Enterobacter wound culture 7/28 left foot - completed course of Zosyn and Vanc - afebrile, s/ BKA 3. ESRD- MWF HD.  Holding heparin postop.HD tomorrow. Lower EDW as tolerated post BKA. UF goal 2.5L yest > tol 1.3L.  Post wt 75.6kg.    4. Hypertension/volume- using midodrine for BP support.BP stable today. No edema or SOB. 5. Anemia-hgb8.9 8/11- down post op, start aranesp, check iron.   6. Metabolic bone disease-Calcium 9.1, corrected 10.1. Lowered dose of calcitriol.  Continue sensipar/Auryxia. Phos 2.9 on 1 auryxia - hold binder with snacks for now.  7. NutritionAlbumin 2.7. Continue supplement.  Renal diet with fluid restrictions recommended. 8. DM- Per primary 9. Hx NICM s/p ICD  Jannifer Hick MD [facility] Pager [phone]

## 2019-03-22 NOTE — Progress Notes (Signed)
Physical Therapy Session Note  Patient Details  Name: Timothy Faulkner MRN: [id number] Date of Birth: June 11, 1946  Today's Date: 03/22/2019 PT Individual Time: 0908-1006 and 7782-4235 PT Individual Time Calculation (min): 58 min and 29 min  Short Term Goals: Week 1:  PT Short Term Goal 1 (Week 1): Patient will perform bed mobiltiy with min A without rails. PT Short Term Goal 2 (Week 1): Patient will perform transfers from bed<>chair with mod A consistently. PT Short Term Goal 3 (Week 1): Patient will perform sit<>stand with mod A. PT Short Term Goal 4 (Week 1): Patient will tolerate and begin education on residual limb wrapping and edema control.  Skilled Therapeutic Interventions/Progress Updates:     Session 1: Pt received supine in bed with nursing staff present to assist with LB hygiene and pt agreeable to therapy session. Rolling R/L with supervision using bedrails during total assist LB clothing management and peri-care. Supine>sit using bedrails with supervision and increased time. Donned R LE heel shoe total assist for time management. Lateral scoot EOB>w/c with min assist for lifting/pivoting hips and cuing for sequencing. Performed ~158ft B UE w/c propulsion with min assist due to pt requiring hand-over-hand assistance for proper propulsion technique as pt demonstrates minimal movement and significantly decreased speed. Performed seated L LE long arc quads with pt demonstrating lack of terminal knee extension. Lateral scoot transfer w/c>EOM with min assist for lifting/pivoting hips. Sit>supine with supervision - pt demonstrates full L knee extension in supine when the hamstring muscle is lengthened on the proximal end.  Performed the following supine L LE exercises:  - quad sets  - straight leg raise - cuing to maintain terminal knee extension through ROM - bridging with L LE on bolster - manual facilitation for increased hip extension Supine>sit with mod assist for trunk upright. Squat  pivot EOM>w/c with min assist for pivoting hips. Transported back to room in w/c and pt left sitting in w/c with needs in reach and seat belt alarm on.   Session 2: Pt received asleep, supine in bed and easy to arouse; however, required increased time to initiate supine>sit mobility due to being drowsy. Supine>sit with supervision using bedrails as pt initially reaching for therapist's hand to assist but directed him in using bedrails for increased independence. Donned R LE heel shoe total assist. R lateral scoot transfer EOB>w/c with min assist for lifting/pivoting and cuing for sequencing/technique. Performed ~186ft B UE w/c propulsion to therapy gym with min assist and continued hand-over-hand facilitation for proper propulsion technique as pt demonstrates minimal forward propulsion without assist - propels at significantly decreased speed. Sit<>stand w/c<>RW x3 with mod assist for lifting and balance during L UE hand transition from w/c armrest to RW - cuing for R UE placement on RW to increase R lateral weight shift. Transported back to room and pt left sitting in w/c with needs in reach and seat belt alarm on.    Therapy Documentation Precautions:  Precautions Precautions: Fall Restrictions Weight Bearing Restrictions: No Other Position/Activity Restrictions: NWB LLE  Pain: Session 1: Reports pain in L LE with RN present for medication administration - therapist provided rest breaks for pain management and performed therapy to pt's tolerance.  Session 2: No complaints of pain during session.   Therapy/Group: Individual Therapy  Tawana Scale, PT, DPT 03/22/2019, 7:54 AM

## 2019-03-22 NOTE — Progress Notes (Signed)
Patient reported to be having spasms which are exacerbating pain. Will add low dose robaxin 500 mg bid prn and monitor for now.

## 2019-03-22 NOTE — Progress Notes (Signed)
Occupational Therapy Session Note  Patient Details  Name: Timothy Faulkner MRN: [id number] Date of Birth: [birth year]  Today's Date: 03/22/2019 OT Individual Time: 1100-1109 OT Individual Time Calculation (min): 9 min  and Today's Date: 03/22/2019 OT Missed Time: 21 Minutes Missed Time Reason: Pain   Short Term Goals: Week 1:  OT Short Term Goal 1 (Week 1): Pt will sit to stand consistently with MOD A for prep of clothing managment OT Short Term Goal 2 (Week 1): Pt will thread BLE into pants wiht S and AE PRN OT Short Term Goal 3 (Week 1): Pt will transfer to [facility] with MOD A and LRAD  Skilled Therapeutic Interventions/Progress Updates:    Pt received supine in bed reporting extreme pain in LLE.  Pt reports just recently transferring back to bed with nursing staff due to excruciating pain.  Pt refusing participation secondary to pain.  However apologetic for refusal, reporting he will attempt during PM session.  Discussed with RN pain med options.  Pt remained supine in bed with all needs in reach.  Therapy Documentation Precautions:  Precautions Precautions: Fall Restrictions Weight Bearing Restrictions: No Other Position/Activity Restrictions: NWB LLE General: General OT Amount of Missed Time: 51 Minutes Pain: Pain Assessment Pain Scale: 0-10 Pain Score: 10-Worst pain ever Pain Type: Surgical pain Pain Location: Leg Pain Orientation: Left Pain Descriptors / Indicators: Aching Pain Frequency: Constant Pain Onset: On-going Patients Stated Pain Goal: 4 Pain Intervention(s): Medication (See eMAR)   Therapy/Group: Individual Therapy  Simonne Come 03/22/2019, 12:07 PM

## 2019-03-22 NOTE — Telephone Encounter (Signed)
Called Timothy Faulkner, advised that I removed the graft, the wound is healing very well. Discussed I will look into possibility of applying a new one while he is in IPR but until then the nurses are to apply Santyl WTD daily.

## 2019-03-23 ENCOUNTER — Inpatient Hospital Stay (HOSPITAL_COMMUNITY): Payer: Medicare Other

## 2019-03-23 ENCOUNTER — Inpatient Hospital Stay (HOSPITAL_COMMUNITY): Payer: Medicare Other | Admitting: Occupational Therapy

## 2019-03-23 LAB — CBC
HCT: 29.4 % — ABNORMAL LOW (ref 39.0–52.0)
Hemoglobin: 8.7 g/dL — ABNORMAL LOW (ref 13.0–17.0)
MCH: 29.9 pg (ref 26.0–34.0)
MCHC: 29.6 g/dL — ABNORMAL LOW (ref 30.0–36.0)
MCV: 101 fL — ABNORMAL HIGH (ref 80.0–100.0)
Platelets: 287 10*3/uL (ref 150–400)
RBC: 2.91 MIL/uL — ABNORMAL LOW (ref 4.22–5.81)
RDW: 17.9 % — ABNORMAL HIGH (ref 11.5–15.5)
WBC: 6.8 10*3/uL (ref 4.0–10.5)
nRBC: 0 % (ref 0.0–0.2)

## 2019-03-23 LAB — RENAL FUNCTION PANEL
Albumin: 2.6 g/dL — ABNORMAL LOW (ref 3.5–5.0)
Albumin: 2.6 g/dL — ABNORMAL LOW (ref 3.5–5.0)
Anion gap: 14 (ref 5–15)
Anion gap: 13 (ref 5–15)
BUN: 24 mg/dL — ABNORMAL HIGH (ref 8–23)
BUN: 37 mg/dL — ABNORMAL HIGH (ref 8–23)
CO2: 24 mmol/L (ref 22–32)
CO2: 26 mmol/L (ref 22–32)
Calcium: 9 mg/dL (ref 8.9–10.3)
Calcium: 9 mg/dL (ref 8.9–10.3)
Chloride: 96 mmol/L — ABNORMAL LOW (ref 98–111)
Chloride: 94 mmol/L — ABNORMAL LOW (ref 98–111)
Creatinine, Ser: 5.23 mg/dL — ABNORMAL HIGH (ref 0.61–1.24)
Creatinine, Ser: 6.85 mg/dL — ABNORMAL HIGH (ref 0.61–1.24)
GFR calc Af Amer: 12 mL/min — ABNORMAL LOW (ref >60)
GFR calc Af Amer: 8 mL/min — ABNORMAL LOW (ref >60)
GFR calc non Af Amer: 10 mL/min — ABNORMAL LOW (ref >60)
GFR calc non Af Amer: 7 mL/min — ABNORMAL LOW (ref >60)
Glucose, Bld: 82 mg/dL (ref 70–99)
Glucose, Bld: 88 mg/dL (ref 70–99)
Phosphorus: 2.5 mg/dL (ref 2.5–4.6)
Phosphorus: 3.1 mg/dL (ref 2.5–4.6)
Potassium: 3.8 mmol/L (ref 3.5–5.1)
Potassium: 3.8 mmol/L (ref 3.5–5.1)
Sodium: 134 mmol/L — ABNORMAL LOW (ref 135–145)
Sodium: 133 mmol/L — ABNORMAL LOW (ref 135–145)

## 2019-03-23 MED ORDER — DARBEPOETIN ALFA 60 MCG/0.3ML IJ SOSY
PREFILLED_SYRINGE | INTRAMUSCULAR | Status: AC
  Administered 2019-03-23: 60 ug via INTRAVENOUS
  Filled 2019-03-23: qty 0.3

## 2019-03-23 MED ORDER — HEPARIN SODIUM (PORCINE) 1000 UNIT/ML IJ SOLN
INTRAMUSCULAR | Status: AC
  Administered 2019-03-23: 4200 [IU]
  Filled 2019-03-23: qty 5

## 2019-03-23 MED ORDER — DOCUSATE SODIUM 100 MG PO CAPS
100.0000 mg | ORAL_CAPSULE | Freq: Two times a day (BID) | ORAL | Status: DC
  Administered 2019-03-23 – 2019-03-27 (×8): 100 mg via ORAL
  Filled 2019-03-23 (×9): qty 1

## 2019-03-23 NOTE — Progress Notes (Signed)
Physical Therapy Session Note  Patient Details  Name: Timothy Faulkner MRN: [id number] Date of Birth: [birth year]  Today's Date: 03/23/2019 PT Individual Time: 1100-1200 PT Individual Time Calculation (min): 60 min   Short Term Goals: Week 1:  PT Short Term Goal 1 (Week 1): Patient will perform bed mobiltiy with min A without rails. PT Short Term Goal 2 (Week 1): Patient will perform transfers from bed<>chair with mod A consistently. PT Short Term Goal 3 (Week 1): Patient will perform sit<>stand with mod A. PT Short Term Goal 4 (Week 1): Patient will tolerate and begin education on residual limb wrapping and edema control.  Skilled Therapeutic Interventions/Progress Updates:   Pt resting in bed.  Using bed reatures, pt sat on EOB with superivison. PT donned surgical shoe on R foot.  Lateral scoot transfer to R with min assist.  W/c propulsion using bil UEs on level tile x 50' x 2.  Cues for efficiency and turns. Distance limited by UE fatigue.  Sit> stand in // with min assist, pulling up with bil hands.  Therapeutic exercise performed with LLE to increase strength for functional mobility- in standing: 10 x 1 L knee extension, L hip extension.  Seated rest breaks between bouts.  Sitting: 10 x 1 L short arc quad knee extension.    At end of session, pt resting in bed with alarm set and needs at hand.     Therapy Documentation Precautions:  Precautions Precautions: Fall Restrictions Weight Bearing Restrictions: No Other Position/Activity Restrictions: NWB LLE   Pain: Pain Assessment Pain Scale: 0-10 Pain Score: 7  Pain Type: Surgical pain Pain Location: Leg Pain Orientation: Left Pain Descriptors / Indicators: Aching Pain Frequency: Constant Pain Onset: On-going Patients Stated Pain Goal: 2 Pain Intervention(s): Medication (See eMAR)    Therapy/Group: Individual Therapy  Vang Kraeger 03/23/2019, 12:08 PM

## 2019-03-23 NOTE — Progress Notes (Signed)
Occupational Therapy Weekly Progress Note  Patient Details  Name: Timothy Faulkner MRN: [id number] Date of Birth: 01/03/1946  Beginning of progress report period: March 17, 2019 End of progress report period: March 23, 2019  Patient has met 2 of 3 short term goals.  Pt making slow and inconsistent progress with BADLs. Pt has missed some therapy secondary to increased pain in LLE. Pt requires mod A/min A for squat pivot tranfsers to w/c. Pt requires mod A for sit<>stand at sink for LB bathing/dressing tasks with mod A for standing balance. Pt requires more than a reasonable amount of time to complete tasks with max verbal cues for safety awareness.   Patient continues to demonstrate the following deficits: muscle weakness, decreased cardiorespiratoy endurance and decreased sitting balance, decreased standing balance, decreased balance strategies and acute pain and therefore will continue to benefit from skilled OT intervention to enhance overall performance with BADL and Reduce care partner burden.  Patient progressing toward long term goals..  Continue plan of care.  OT Short Term Goals Week 1:  OT Short Term Goal 1 (Week 1): Pt will sit to stand consistently with MOD A for prep of clothing managment OT Short Term Goal 1 - Progress (Week 1): Met OT Short Term Goal 2 (Week 1): Pt will thread BLE into pants wiht S and AE PRN OT Short Term Goal 2 - Progress (Week 1): Progressing toward goal OT Short Term Goal 3 (Week 1): Pt will transfer to [facility] with MOD A and LRAD OT Short Term Goal 3 - Progress (Week 1): Met Week 2:  OT Short Term Goal 1 (Week 2): Pt will thread BLE into pants wiht S and AE PRN OT Short Term Goal 2 (Week 2): Pt will maintain standing balance with min A for clothing management OT Short Term Goal 3 (Week 2): Pt will perform toilet transfer with min A     Leroy Libman 03/23/2019, 6:56 AM

## 2019-03-23 NOTE — Progress Notes (Signed)
[facility] Progress Note   Subjective:   Patient seen in room. Stump pain improving. Denies sob, cough, CP, palpitations, abdominal pain, N/V/D, edema. Daughter present today.   Objective Vitals:   03/22/19 0410 03/22/19 1434 03/22/19 1955 03/23/19 0548  BP: (!) 109/57 (!) 112/46 126/65 117/77  Pulse: 82 83 83 86  Resp: 17 20 19 19   Temp: 98 F (36.7 C) 98.5 F (36.9 C) 97.9 F (36.6 C) 98.6 F (37 C)  TempSrc: Oral Oral    SpO2: 96% 97% 92% 97%  Weight: 75.6 kg     Height:       Physical Exam General:comfortable lying flat in bed.  Heart:RRR, no murmurs rubs or gallops Lungs:CTA bilaterally without wheezing, rhonchi or rales Abdomen:Soft, non-tender, non-distended. +BS Extremities:No edema. LBKA stump wrapped. Dialysis Access:Right IJ TDC,no erythema/drainage  Additional Objective Labs: Basic Metabolic Panel: Recent Labs  Lab 03/18/19 1339 03/21/19 1545 03/22/19 1116  NA 131* 134* 134*  K 4.0 4.3 3.8  CL 91* 92* 96*  CO2 25 28 24   GLUCOSE 89 89 82  BUN 36* 43* 24*  CREATININE 7.08* 8.11* 5.23*  CALCIUM 9.1 8.9 9.0  PHOS 3.0 2.9 2.5   Liver Function Tests: Recent Labs  Lab 03/18/19 1339 03/21/19 1545 03/22/19 1116  ALBUMIN 2.7* 2.6* 2.6*   CBC: Recent Labs  Lab 03/17/19 0554 03/17/19 1627 03/17/19 2341 03/18/19 1339 03/21/19 1545 03/22/19 1116  WBC 7.9 7.6 7.8 7.4 8.2 8.2  NEUTROABS 5.4  --   --   --   --   --   HGB 9.4* 9.8* 9.3* 9.0* 8.9* 8.5*  HCT 30.7* 32.6* 31.4* 29.9* 30.2* 29.2*  MCV 99.7 99.7 100.0 99.3 101.7* 101.7*  PLT 338 359 351 372 365 299   Blood Culture    Component Value Date/Time   SDES BLOOD RIGHT HAND 03/12/2019 1450   SPECREQUEST  03/12/2019 1450    BOTTLES DRAWN AEROBIC AND ANAEROBIC Blood Culture results may not be optimal due to an inadequate volume of blood received in culture bottles   CULT  03/12/2019 1450    NO GROWTH 5 DAYS Performed at [facility], [place] [address]  [place], [place] [address]    REPTSTATUS 03/17/2019 FINAL 03/12/2019 1450    CBG: Recent Labs  Lab 03/16/19 1124 03/16/19 1628 03/16/19 2039 03/17/19 0656 03/17/19 1120  GLUCAP 135* 119* 127* 68* 87   Medications: . ferric gluconate (FERRLECIT/NULECIT) IV     . aspirin EC  81 mg Oral Daily  . calcitRIOL  0.5 mcg Oral Q M,W,F-HD  . Chlorhexidine Gluconate Cloth  6 each Topical Q0600  . Chlorhexidine Gluconate Cloth  6 each Topical Q0600  . cinacalcet  30 mg Oral QODAY  . collagenase   Topical Daily  . darbepoetin (ARANESP) injection - DIALYSIS  60 mcg Intravenous Q Wed-HD  . docusate sodium  100 mg Oral Daily  . feeding supplement (PRO-STAT SUGAR FREE 64)  30 mL Oral BID  . ferric citrate  420 mg Oral TID WC  . heparin  5,000 Units Subcutaneous Q8H  . midodrine  10 mg Oral Q M,W,F-HD  . multivitamin  1 tablet Oral QHS  . rosuvastatin  10 mg Oral q1800  . traMADol  50 mg Oral Q12H    Dialysis Orders: 4h 400/800 78kg 3K/2.5Ca bat Hep 5000 +2000 midrun R IJ TDC Calcitriol 0.5  no Mircera or Fe Recent labs: iPTH 248 Ca/P ok hgb 11.8 31% sat   Assessment/Plan: 1.  PAD with critical limb ischemia - prior revascularization in July; seen by [facility] - no further vascular intervention available to improve circulation. 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 On CIR now. CTM.  2. Enterobacter wound culture 7/28 left foot - completed course of Zosyn and Vanc - afebrile, s/ BKA 3. ESRD- MWF HD.  HD today . Lower EDW as tolerated post BKA. UF goal 2.5L Monday > tol 1.3L.  Post wt 75.6kg on 8/10.  Post weight after HD.  4. Hypertension/volume- using midodrine for BP support.BP stable today. No edema or SOB. 5. Anemia-hgb8.9 8/11- down post op, started aranesp 8/10. Iron sat 23% - give 0.5g IV iron over 4 tx starting 8/12.  6. Metabolic bone disease-Calcium 9.1, corrected 10.1. Lowered dose of calcitriol.  Continue sensipar/Auryxia. Phos 2.5 on 1 auryxia - hold for now.  7. NutritionAlbumin 2.6.  Continue supplement. Renal diet with fluid restrictions recommended. 8. DM- Per primary 9. Hx NICM s/p ICD  Jannifer Hick MD [facility] Pager [phone]

## 2019-03-23 NOTE — Progress Notes (Signed)
Spoke with Sherry-daughter via telephone to discuss team conference goals supervision-min assist level and discharge date 8/20. Daughter reports she is the one who was doing the dressing changes for his wounds. [facility] came out a few times for nursing. Discussed having daughter come in next week to go through therapies with Dad to see his care needs. She will plan to do this next week prior to discharge home. Work in discharge needs for discharge next Thursday.

## 2019-03-23 NOTE — Progress Notes (Signed)
Occupational Therapy Session Note  Patient Details  Name: Timothy Faulkner MRN: [id number] Date of Birth: 1946-06-18  Today's Date: 03/23/2019 OT Individual Time: 0705-0720 OT Individual Time Calculation (min): 15 min  and Today's Date: 03/23/2019 OT Missed Time: 60 Minutes Missed Time Reason: Patient unwilling/refused to participate without medical reason   Short Term Goals: Week 2:  OT Short Term Goal 1 (Week 2): Pt will thread BLE into pants wiht S and AE PRN OT Short Term Goal 2 (Week 2): Pt will maintain standing balance with min A for clothing management OT Short Term Goal 3 (Week 2): Pt will perform toilet transfer with min A  Skilled Therapeutic Interventions/Progress Updates:    Pt supine in bed, and sleeping soundly. Pt waking up and reports no pain this session. OT reviewing schedule with pt who reports, " This is too early! I don't want to start until 10 or 11." OT provided education of why this is not possible in order to allow pt rest breaks between therapy sessions and needing AM therapies on dialysis days. Pt continues to decline and states, " You will be lucky if I get up for that 9 o'clock one." Pt remained in bed with bed alarm activated and call bell within reach.  60 missed minutes secondary to pt refusal.  Therapy Documentation Precautions:  Precautions Precautions: Fall Restrictions Weight Bearing Restrictions: No Other Position/Activity Restrictions: NWB LLE General: General OT Amount of Missed Time: 60 Minutes Vital Signs: Therapy Vitals Temp: 98.6 F (37 C) Pulse Rate: 86 Resp: 19 BP: 117/77 Patient Position (if appropriate): Lying Oxygen Therapy SpO2: 97 % O2 Device: Room Air Pain: Pain Assessment Pain Scale: 0-10 Pain Score: 0-No pain   Therapy/Group: Individual Therapy  Gypsy Decant 03/23/2019, 7:26 AM

## 2019-03-23 NOTE — Progress Notes (Signed)
Orthopedic Tech Progress Note Patient Details:  Timothy Faulkner 1945-10-08 [id number] Called in order to [employer] for a RETENTION sock Patient ID: Timothy Faulkner, male   DOB: 02-24-1946, 73 y.o.   MRN: [id number]   Janit Pagan 03/23/2019, 12:26 PM

## 2019-03-23 NOTE — Progress Notes (Signed)
[place] PHYSICAL MEDICINE & REHABILITATION PROGRESS NOTE   Subjective/Complaints: Pt reports  He's doing well- Pain much better controlled with current regimen LBM yesterday 3x- staples were taken out of R foot amputation- santyl around edges of wound/skin graft.  ESRD- HD today    ROS: Patient denies fever, rash, sore throat, blurred vision, nausea, vomiting, diarrhea, cough, shortness of breath or chest pain, joint or back pain, headache, or mood change.   Objective:   No results found. Recent Labs    03/21/19 1545 03/22/19 1116  WBC 8.2 8.2  HGB 8.9* 8.5*  HCT 30.2* 29.2*  PLT 365 299   Recent Labs    03/21/19 1545 03/22/19 1116  NA 134* 134*  K 4.3 3.8  CL 92* 96*  CO2 28 24  GLUCOSE 89 82  BUN 43* 24*  CREATININE 8.11* 5.23*  CALCIUM 8.9 9.0    Intake/Output Summary (Last 24 hours) at 03/23/2019 1315 Last data filed at 03/23/2019 0830 Gross per 24 hour  Intake 462 ml  Output -  Net 462 ml     Physical Exam: Vital Signs Blood pressure 102/65, pulse 86, temperature 98.1 F (36.7 C), temperature source Oral, resp. rate 18, height 5\' 8"  (1.727 m), weight 77.3 kg, SpO2 93 %.   Constitutional: sitting up in bed, asked to be called Timothy Faulkner- very bright, perky affect today compared to last week, eating breakfast, NAD HEENT: EOMI, oral membranes moist- Eyes still watering, which is chronic- dabbing with kleenex as usual- daily Cardiovascular: RRR without murmur, rubs, or gallops Respiratory: CTA Bilaterally without wheezes or rales. Normal effort    GI: BS +, non-tender, non-distended, soft Genitourinary:    Penis normal.   Musculoskeletal:     Comments: L BKA straighter today- allowed me to assess/take off bandage- nice shaping, no edges, no drainage, no erythema, staples intact Neuro: alert and oriented x2 -didn't remember doctor again . Strength 4+/5 RUE and RLE, LLE limited by pain  Skin:  Right Foot dry, 5th toe amp site clean,intact. skin otherwise  intact RLE, left BK incision CDI with staples.  Psychiatric:  Timothy Faulkner, joking    Assessment/Plan: 1. 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  Physiatrist is providing close team supervision and 24 hour management of active medical problems listed below.  Physiatrist and rehab team continue to assess barriers to discharge/monitor patient progress toward functional and medical goals  Care Tool:  Bathing    Body parts bathed by patient: Right arm, Left arm, Abdomen, Chest, Front perineal area, Left upper leg, Right upper leg   Body parts bathed by helper: Buttocks Body parts n/a: Right lower leg, Left lower leg   Bathing assist Assist Level: Moderate Assistance - Patient 50 - 74%     Upper Body Dressing/Undressing Upper body dressing   What is the patient wearing?: Pull over shirt    Upper body assist Assist Level: Set up assist    Lower Body Dressing/Undressing Lower body dressing      What is the patient wearing?: Incontinence brief     Lower body assist Assist for lower body dressing: Maximal Assistance - Patient 25 - 49%     Toileting Toileting Toileting Activity did not occur (Clothing management and hygiene only): N/A (no void or bm)  Toileting assist Assist for toileting: Moderate Assistance - Patient 50 - 74%  Transfers Chair/bed transfer  Transfers assist     Chair/bed transfer assist level: Minimal Assistance - Patient > 75%     Locomotion Ambulation   Ambulation assist   Ambulation activity did not occur: Safety/medical concerns(decreased strength/activity tolerance)          Walk 10 feet activity   Assist  Walk 10 feet activity did not occur: Safety/medical concerns(decreased strength/activity tolerance)        Walk 50 feet activity   Assist Walk 50 feet with 2 turns  activity did not occur: Safety/medical concerns(decreased strength/activity tolerance)         Walk 150 feet activity   Assist Walk 150 feet activity did not occur: Safety/medical concerns(decreased strength/activity tolerance)         Walk 10 feet on uneven surface  activity   Assist Walk 10 feet on uneven surfaces activity did not occur: Safety/medical concerns(decreased strength/activity tolerance)         Wheelchair     Assist Will patient use wheelchair at discharge?: Yes Type of Wheelchair: Manual    Wheelchair assist level: Supervision/Verbal cueing Max wheelchair distance: 50    Wheelchair 50 feet with 2 turns activity    Assist        Assist Level: Supervision/Verbal cueing   Wheelchair 150 feet activity     Assist Wheelchair 150 feet activity did not occur: Safety/medical concerns(decreased strength/activity tolerance)       Plan: 1.  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  --Continue CIR therapies including PT, OT  2.  Antithrombotics: -DVT/anticoagulation:  Pharmaceutical: Heparin due to ESRD on HD             -antiplatelet therapy: Low dose ASA 3. Pain Management: Due to L BKA, phantom pain, residual limb pain, and R MTP amputation residual limb pain, esp after revascularization procedure.  Patient is currently on Oxycodone 5mg  q8 hours prn-try to limit due to neuro-sedating effects. Increase if he absolutely needs it   Overall seems to be managing better  Discussed importance of edema mgt/snug ACE wrap/manual desensitization, etc to help decreased stump pain.   8/10- will increase Oxycodone to 5 mg q6 hours prn and add a little tramadol 50 mg BID for more steady pain control   8/11- pain better controlled per pt and nursing. Con't new regimen.  4. Mood: LCSW to follow for  evaluation and support.              -antipsychotic agents: NA 5. Neuropsych: This patient is capable of making decisions on his own behalf at this time,  -follow for cognition  -consider neuro-psych assessment  -?baseline 6. Skin/Wound Care: Monitor right foot wound daily --continue santyl with damp to dry dressing on. 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 S  -left BKA: dry dressing removed and examined. Continue ACE wrap for edema control and residual limb shaping   -wound looks good, should be ready for shrinker soon   8/12- will order retention sock and stop ACE wrap/bandages 7. Fluids/Electrolytes/Nutrition: Strict I/O's although anuric--1200 cc FR/day. Labs with HD- M/W/F-   - had bm 8/8  8/10- BM 8/9, per pt.  8/12- 3 BMs yesterday  8. ESRD: Continue HD MWF at end of the day to help with tolerance of therapy. Will not change to T/H/S  unless pt cannot tolerate current schedule.    9. Chronic hypotension with previous hx of HTN: Continue midodrine on HD days. 10 mg dose. 10. PAD/right foot ulcer s/p graft: WBAT with darco shoe-  8/12- according to Podiatry, can WBAT in surgical shoe 11. Hypotension: Continue midodrine on HD days.  12. Anemia of chronic disease: Continue to monitor--ESA/Fe per nephrology - current H/H 9.4 and 30.7- stable/mild decrease since surgery- will con't to monitor with labs.  -follow up labs with HD 13. Hypoalbuminemia: Continue supplement to promote healing.  80. Dispo- will discharge to home, with family.  8/12- team conference today- d/c date set for 8/20      LOS: 6 days A FACE TO FACE EVALUATION WAS PERFORMED  Jenissa Tyrell 03/23/2019, 1:15 PM

## 2019-03-23 NOTE — Progress Notes (Signed)
Occupational Therapy Session Note  Patient Details  Name: Timothy Faulkner MRN: [id number] Date of Birth: Sep 01, 1945  Today's Date: 03/23/2019 OT Individual Time: 4888-9169 OT Individual Time Calculation (min): 15 min    Short Term Goals: Week 2:  OT Short Term Goal 1 (Week 2): Pt will thread BLE into pants wiht S and AE PRN OT Short Term Goal 2 (Week 2): Pt will maintain standing balance with min A for clothing management OT Short Term Goal 3 (Week 2): Pt will perform toilet transfer with min A  Skilled Therapeutic Interventions/Progress Updates:    Pt resting in bed upon arrival with RLE over EOB.  Pt c/o ongoing pain in LLE and states he just can't do anything today.  Pt had refused earlier therapy session as well.  Encouraged pt to wash up and change clothing.  Pt focused on making phone call and required assistance 2/2 visual deficits. Pt remained in w/c with all needs within reach and bed alarm activated.   Therapy Documentation Precautions:  Precautions Precautions: Fall Restrictions Weight Bearing Restrictions: No Other Position/Activity Restrictions: NWB LLE General: General OT Amount of Missed Time: 30 Minutes    Pain: Pain Assessment Pain Scale: 0-10 Pain Score: 7  Pain Type: Surgical pain Pain Location: Leg Pain Orientation: Left Pain Descriptors / Indicators: Aching Pain Frequency: Constant Pain Onset: On-going Patients Stated Pain Goal: 2 Pain Intervention(s): Repositioned  Therapy/Group: Individual Therapy  Leroy Libman 03/23/2019, 10:04 AM

## 2019-03-24 ENCOUNTER — Inpatient Hospital Stay (HOSPITAL_COMMUNITY): Payer: Medicare Other

## 2019-03-24 ENCOUNTER — Ambulatory Visit (INDEPENDENT_AMBULATORY_CARE_PROVIDER_SITE_OTHER): Payer: Medicare Other | Admitting: Podiatry

## 2019-03-24 ENCOUNTER — Telehealth: Payer: Self-pay | Admitting: Podiatry

## 2019-03-24 DIAGNOSIS — N186 End stage renal disease: Secondary | ICD-10-CM

## 2019-03-24 DIAGNOSIS — G8918 Other acute postprocedural pain: Secondary | ICD-10-CM

## 2019-03-24 DIAGNOSIS — G546 Phantom limb syndrome with pain: Secondary | ICD-10-CM

## 2019-03-24 DIAGNOSIS — Z5329 Procedure and treatment not carried out because of patient's decision for other reasons: Secondary | ICD-10-CM

## 2019-03-24 DIAGNOSIS — K5903 Drug induced constipation: Secondary | ICD-10-CM

## 2019-03-24 DIAGNOSIS — D638 Anemia in other chronic diseases classified elsewhere: Secondary | ICD-10-CM

## 2019-03-24 NOTE — Telephone Encounter (Signed)
Called daughter [name] no answer.

## 2019-03-24 NOTE — Progress Notes (Signed)
Occupational Therapy Session Note  Patient Details  Name: Timothy Faulkner MRN: [id number] Date of Birth: 07/21/1946  Today's Date: 03/24/2019 OT Individual Time: 0930-1040 OT Individual Time Calculation (min): 70 min    Short Term Goals: Week 2:  OT Short Term Goal 1 (Week 2): Pt will thread BLE into pants wiht S and AE PRN OT Short Term Goal 2 (Week 2): Pt will maintain standing balance with min A for clothing management OT Short Term Goal 3 (Week 2): Pt will perform toilet transfer with min A  Skilled Therapeutic Interventions/Progress Updates:    OT intervention with focus on sit<>stand, standing balance, functional transfers, BADL training, activity tolerance, and safety awareness to increase independence with BADLS. Pt engaged in bathing/dressing with sit<>stand from w/c at sink.  Pt required min A for sit<>stand and standing balance leaning on counter top.  Pt requires mod A for standing balance when not leaning against counter top. Pt fatigues quickly when standing and requires rest breaks to complete tasks when standing.  Pt educated/instructed on donning pants.  Pt initially attempted to thread LLE into pants first and was unsuccessful in donning pants.  Pt requires assistance locating items on counter top secondary to visual deficits.  Pt engaged in standing tasks at sink to increased RLE strength and overall activity tolerance. Pt requested to return to bed at end of session and completed squat pivot transfer with min A.  Pt able to reposition self in bed without assistance.  Pt remained in bed with all needs within reach and bed alarm activated.   Therapy Documentation Precautions:  Precautions Precautions: Fall Restrictions Weight Bearing Restrictions: Yes RLE Weight Bearing: Weight bearing as tolerated LLE Weight Bearing: Non weight bearing Other Position/Activity Restrictions: NWB LLE  Pain: Pt c/o 9.5/10 pain in LLE; repositioned and RN Angelina  notified    Therapy/Group: Individual Therapy  Leroy Libman 03/24/2019, 10:45 AM

## 2019-03-24 NOTE — Progress Notes (Signed)
Physical Therapy Session Note  Patient Details  Name: Timothy Faulkner MRN: [id number] Date of Birth: March 23, 1946  Today's Date: 03/24/2019 PT Individual Time: 1300-1400 PT Individual Time Calculation (min): 60 min   Short Term Goals: Week 2:  PT Short Term Goal 1 (Week 2): = LTGs due to LOS  Skilled Therapeutic Interventions/Progress Updates:     Patient in bed set up to eat lunch with RN in the room upon PT arrival. Patient alert and agreeable to PT session.  Therapeutic Activity: Bed Mobility: Patient performed rolling on a mat table with supervision in each direction and to prone with cues for protecting his residual limb. He performed supine to/from sit with min A in a flat bed without use of rails and on a mat table. Provided verbal cues for reaching for edge of bed/mat to roll then push to sit up, as opposed to pulling up on therapist. Transfers: Patient performed squat pivot transfers with CGA-close supervision with increased time and rest breaks between scooting and transferring. Provided verbal cues for hand placement, w/c set up, sequencing, and head-hips relationship.  Wheelchair Mobility:  Patient propelled wheelchair 50 feet with supervision with significant increased time. Distance limited due to decreased activity tolerance. Provided verbal cues for larger strokes for more efficient propulsion and turning technique. Educated on use of breaks throughout session. Requires total A for donning doffing leg rest and residual limb pad due to poor eye sight.   Neuromuscular Re-ed: Patient performed sitting balance EOB to eat lunch with supervision for safety x10 min.  Therapeutic Exercise: Patient performed the following exercises with verbal and tactile cues for proper technique. -L LAQs 2x5 -L SLR 2x5 -L side lying hip abduction 2xx10 -L hamstring curls in prone 2x5 with decreased ROM (full knee extension in prone) -R hamstring curls in prone 2x10 -w/c push-ups 2x5 educated on  pressure relief every 20-30 min performing a set of 5, patient able to teach back schedule in sitting  Patient in w/c at end of session with breaks locked, seat belt alarm set, and all needs within reach. Patient agreeable to sit up for at least 1 hour following session.   Therapy Documentation Precautions:  Precautions Precautions: Fall Restrictions Weight Bearing Restrictions: Yes RLE Weight Bearing: Weight bearing as tolerated LLE Weight Bearing: Non weight bearing Other Position/Activity Restrictions: NWB LLE Pain: Reported 4/10 residual limb pain, provided medication by RN during session. PT provided repositioning, distraction and rest breaks for pain interventions throughout session.    Therapy/Group: Individual Therapy  Savonna Birchmeier L Keiton Cosma PT, DPT  03/24/2019, 4:15 PM

## 2019-03-24 NOTE — Progress Notes (Signed)
Patient hospitalized

## 2019-03-24 NOTE — Progress Notes (Signed)
[facility] PHYSICAL MEDICINE & REHABILITATION PROGRESS NOTE   Subjective/Complaints: Patient seen sitting up in bed this morning.  He states he slept well overnight.  He is in good spirits.  ROS: Denies CP, SOB, N/V/D  Objective:   No results found. Recent Labs    03/22/19 1116 03/23/19 1256  WBC 8.2 6.8  HGB 8.5* 8.7*  HCT 29.2* 29.4*  PLT 299 287   Recent Labs    03/22/19 1116 03/23/19 1257  NA 134* 133*  K 3.8 3.8  CL 96* 94*  CO2 24 26  GLUCOSE 82 88  BUN 24* 37*  CREATININE 5.23* 6.85*  CALCIUM 9.0 9.0    Intake/Output Summary (Last 24 hours) at 03/24/2019 1313 Last data filed at 03/24/2019 0900 Gross per 24 hour  Intake 400 ml  Output 1613 ml  Net -1213 ml     Physical Exam: Vital Signs Blood pressure (!) 104/57, pulse 81, temperature 97.9 F (36.6 C), resp. rate 17, height 5\' 8"  (1.727 m), weight 75.6 kg, SpO2 93 %. Constitutional: No distress . Vital signs reviewed. HENT: Normocephalic.  Atraumatic. Eyes: EOMI. No discharge. Cardiovascular: No JVD. Respiratory: Normal effort.  No stridor. GI: Non-distended. Skin: Warm and dry.  Intact. Psych: Normal mood.  Normal behavior. Musc: Left BKA with edema and tenderness Neuro: Alert Motor: Strength 4+/5 RUE and RLE LLE >/3/5, limited by pain Skin: Dressing to b/l LE C/D/I Right Foot dry, 5th toe amp site clean,intact. skin otherwise intact RLE, left BK incision CDI with staples.  Psychiatric: Normal mood. Normal affect.   Assessment/Plan: 1. 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  Physiatrist is providing close team supervision and 24 hour management of active medical problems listed below.  Physiatrist and rehab team continue to assess barriers to discharge/monitor patient progress toward functional and medical goals  Care Tool:  Bathing    Body  parts bathed by patient: Right arm, Left arm, Abdomen, Chest, Front perineal area, Left upper leg, Right upper leg, Buttocks, Face   Body parts bathed by helper: Buttocks Body parts n/a: Right lower leg, Left lower leg   Bathing assist Assist Level: Minimal Assistance - Patient > 75%     Upper Body Dressing/Undressing Upper body dressing   What is the patient wearing?: Pull over shirt    Upper body assist Assist Level: Set up assist    Lower Body Dressing/Undressing Lower body dressing      What is the patient wearing?: Pants     Lower body assist Assist for lower body dressing: Minimal Assistance - Patient > 75%     Toileting Toileting Toileting Activity did not occur (Clothing management and hygiene only): N/A (no void or bm)  Toileting assist Assist for toileting: Moderate Assistance - Patient 50 - 74%     Transfers Chair/bed transfer  Transfers assist     Chair/bed transfer assist level: Moderate Assistance - Patient 50 - 74%     Locomotion Ambulation   Ambulation assist   Ambulation activity did not occur: Safety/medical concerns(decreased strength/activity tolerance)          Walk 10 feet activity   Assist  Walk 10 feet activity did not occur: Safety/medical concerns(decreased strength/activity tolerance)        Walk 50 feet activity   Assist Walk 50 feet with 2 turns activity did not occur: Safety/medical concerns(decreased strength/activity tolerance)  Walk 150 feet activity   Assist Walk 150 feet activity did not occur: Safety/medical concerns(decreased strength/activity tolerance)         Walk 10 feet on uneven surface  activity   Assist Walk 10 feet on uneven surfaces activity did not occur: Safety/medical concerns(decreased strength/activity tolerance)         Wheelchair     Assist Will patient use wheelchair at discharge?: Yes Type of Wheelchair: Manual    Wheelchair assist level: Supervision/Verbal  cueing Max wheelchair distance: 100    Wheelchair 50 feet with 2 turns activity    Assist        Assist Level: Supervision/Verbal cueing   Wheelchair 150 feet activity     Assist Wheelchair 150 feet activity did not occur: Safety/medical concerns(decreased strength/activity tolerance)       Plan: 1.  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  Cont CIR 2.  Antithrombotics: -DVT/anticoagulation:  Pharmaceutical: Heparin due to ESRD on HD             -antiplatelet therapy: Low dose ASA 3. Pain Management: Due to L BKA, phantom pain, residual limb pain, and R MTP amputation residual limb pain, esp after revascularization procedure.  Have discussed importance of edema mgt/snug ACE wrap/manual desensitization, etc to help decreased stump pain.   Increased Oxycodone to 5 mg q6 hours prn and added a tramadol 50 mg BID for more steady pain control   Stable on 8/13 4. Mood: LCSW to follow for evaluation and support.              -antipsychotic agents: NA 5. Neuropsych: This patient is capable of making decisions on his own behalf at this time,  -follow for cognition  -consider neuro-psych assessment  -?baseline 6. Skin/Wound Care: Monitor right foot wound --continue santyl with damp to dry dressing on. 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   Cont dressing to left BKA, will consider shrinker 7. Fluids/Electrolytes/Nutrition: Strict I/O's although anuric--1200 cc FR/day. Labs with HD- M/W/F-  8. ESRD: Cont HD MWF at end of the day to help with tolerance of therapy. Will not change to T/H/S unless pt cannot tolerate current schedule.   9. Chronic hypotension with previous hx of HTN: Continue midodrine on HD days. 10 mg dose.  Labile, but asymptomatic on 8/13, likely related to HD 10. PAD/right foot ulcer  s/p graft: WBAT with darco shoe-   8/12- according to Podiatry, can WBAT in surgical shoe 11. Anemia of chronic disease: Continue to monitor--ESA/Fe per nephrology -   Hb 8.7 on 8/12  Cont to monitor  -follow up labs with HD 12. Hypoalbuminemia: Continue supplement to promote healing.  39. Dispo- will discharge to home, with family.  8/12- team conference today- d/c date set for 8/20  14. Drug induced constipation  Improving overall  LOS: 7 days A FACE TO FACE EVALUATION WAS PERFORMED  Timothy Faulkner 03/24/2019, 1:13 PM

## 2019-03-24 NOTE — Patient Care Conference (Signed)
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 Gen Team L BKA, gangrene;  Admit Date/Time:  03/17/2019  3:53 PM Admission Comments: No comment available   Primary Diagnosis:  S/P BKA (below knee amputation), left (HCC) Principal Problem: S/P BKA (below knee amputation), left [facility])  Patient Active Problem List   Diagnosis Date Noted  . S/P BKA (below knee amputation), left ([place]) 03/18/2019  . Unilateral complete BKA, left, initial encounter ([place]) 03/17/2019  . Critical lower limb ischemia 03/12/2019  . Hypotension 02/23/2019  . Cellulitis of left foot 02/23/2019  . Gangrene of toe of left foot ([place]) 02/22/2019  . 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 Ulcer of right foot with fat layer exposed ([place]) 09/02/2018  . Encounter for planned post-operative wound closure   . Acute osteomyelitis of metatarsal bone of right foot ([place])   . 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 Osteomyelitis of right foot ([place])   . Vascular calcification   . Cellulitis and abscess of foot, except toes   . Anemia associated with chronic renal failure 07/13/2018  . Cardiomyopathy, dilated, nonischemic ([place]) 07/13/2018  . Diabetic foot infection ([place]) 07/13/2018  . PAD (peripheral artery disease) ([place])   . Macular degeneration   . Chronic systolic heart failure ([place]) 08/22/2014  . Diabetic infection of right foot ([place]) 07/13/2014  . ESRD (end stage renal disease) on dialysis ([place]) 11/11/2013  . Snoring 11/11/2013  . Unspecified sleep apnea 11/11/2013  . Other pancytopenia ([place])  09/22/2013  . Hemoptysis 09/20/2013  . Pre-transplant evaluation for kidney transplant 01/11/2013  . Automatic implantable cardioverter-defibrillator in situ 10/01/2010  . HYPERCHOLESTEROLEMIA, MIXED 05/03/2010  . Essential hypertension 05/03/2010  . CHF 05/03/2010    Expected Discharge Date: Expected Discharge Date: 03/31/19  Team Members Present: Physician leading conference: Dr. Courtney Heys Social Worker Present: Ovidio Kin, LCSW Nurse Present: Dorthula Nettles, RN PT Present: Georjean Mode, PT OT Present: Willeen Cass, OT;Roanna Epley, COTA SLP Present: Weston Anna, SLP PPS Coordinator present : Gunnar Fusi, Novella Olive, PT     Current Status/Progress Goal Weekly Team Focus  Medical   ESRD on HD M/W/F, L BKA which is new, sedation, poor pain control  improved pain control, improved wakefulness  pain control and shaping of L BKA   Bowel/Bladder   continenet of bowel & bladder, LBM 03/22/19- stools were hard & black  remain continent  continue to monitor & assist as needed   Swallow/Nutrition/ Hydration             ADL's   bathing-mod A; LB dressing-mod A; functional transfers (squat pivot)-mod A;  min A overall; toilet transfers-supervision  functional transfers; sit<>stand, standing balance, BADL training, pain management   Mobility   min assist lateral scoot transfer to R, S w/c x 50'; mod assist sit > stand to RW; min assist sit to stand in parallel bars  S bed mobility and basic tr, min assist car, s w/c x 150'  pt ed, activity tolerance, mobility, w/c   Communication  Safety/Cognition/ Behavioral Observations            Pain   c/o6-10/10, has prn tylenol & oxycodone, scheduled tramadol  pain scale <4/10  assess & treata s needed   Skin   has left BKA incision & right foot wound, santyl to the edges of wound & wet to moist drsg daily  no signs of infection, signs of healing  assess q shift      *See Care Plan and progress notes for long  and short-term goals.     Barriers to Discharge  Current Status/Progress Possible Resolutions Date Resolved   Physician    Decreased caregiver support;Medical stability;Hemodialysis;Weight;Weight bearing restrictions;Medication compliance;Wound Care  n/a  min-mod assist of OT and PT  will schedule tramadol 50 mg BID for pain and con't oxycodone prn; get retention sock/start shrinker once staples out      Nursing  Hemodialysis;Wound Care               PT                    OT                  SLP                SW                Discharge Planning/Teaching Needs:  Home with wife and daughter, daughter was assisting prior to admission. Will need family education prior to DC home. Pt's main issue is his pain.      Team Discussion:  Goals min-supervision level. Currently mod assist level. Pain being better managed and able to participate in therapies more so now. MD ordering sock compression, due to amputation looks good healing well but still has sutures in. Working on balance issues. Will need family training prior to discharge home with wife and daughter.  Revisions to Treatment Plan:  DC 8/20    Continued Need for Acute Rehabilitation Level of Care: The patient requires daily medical management by a physician with specialized training in physical medicine and rehabilitation for the following conditions: Daily direction of a multidisciplinary physical rehabilitation program to ensure safe treatment while eliciting the highest outcome that is of practical value to the patient.: Yes Daily medical management of patient stability for increased activity during participation in an intensive rehabilitation regime.: Yes Daily analysis of laboratory values and/or radiology reports with any subsequent need for medication adjustment of medical intervention for : Blood pressure problems;Nutritional problems;Wound care problems;Renal problems   I attest that I was present, lead the team  conference, and concur with the assessment and plan of the team. Teleconference held due to COVID 19   Dhriti Fales, Gardiner Rhyme 03/24/2019, 9:34 AM

## 2019-03-24 NOTE — Progress Notes (Signed)
Physical Therapy Weekly Progress Note  Patient Details  Name: Timothy Faulkner MRN: [id number] Date of Birth: 06-Sep-1945  Beginning of progress report period: 03/18/19 End of progress report period: 03/24/19 Today's Date: 03/24/2019 PT Individual Time: 0800-0900 PT Individual Time Calculation (min): 60 min   Patient has met 3 of 4 short term goals.  Due to pt's very low vision, he is a poor candidate for self wrapping L residual limb.  Patient continues to demonstrate the following deficits muscle weakness and muscle joint tightness, decreased cardiorespiratoy endurance, decreased visual acuity, decreased memory and decreased standing balance and decreased balance strategies and therefore will continue to benefit from skilled PT intervention to increase functional independence with mobility.  Patient progressing toward long term goals..  Continue plan of care.  PT Short Term Goals Week 1:  PT Short Term Goal 1 (Week 1): Patient will perform bed mobiltiy with min A without rails. PT Short Term Goal 1 - Progress (Week 1): Met PT Short Term Goal 2 (Week 1): Patient will perform transfers from bed<>chair with mod A consistently. PT Short Term Goal 2 - Progress (Week 1): Met PT Short Term Goal 3 (Week 1): Patient will perform sit<>stand with mod A. PT Short Term Goal 3 - Progress (Week 1): Met PT Short Term Goal 4 (Week 1): Patient will tolerate and begin education on residual limb wrapping and edema control. PT Short Term Goal 4 - Progress (Week 1): Partly met Week 2:  PT Short Term Goal 1 (Week 2): = LTGs due to LOS  Skilled Therapeutic Interventions/Progress Updates:   Pt resting in bed.  He stated that he slept very well last night.  Discussed edema mgt re: residual limb.  Pt needed max cues to recognize the purpose of limb wrapping,and elevation.  He did state that moving his LLE should help with edema.  Due to pt's low vision, his ability to self -wrap limb iwht ACES is doubtful.  Bed  mobility training in flat bed, no rails.  Mod assist fading to min assist to roll L (as per home setting) and arise.  He has problems getting his L elbow under him to push up.  PT donned surgical shoe RLE.  Lateral scoot transfer with min/mod assist to R.  W/c propulsion using bil UEs with cues for technique and turns, x 100', limited by muscle fatigue bil UEs.  Seated L knee quad sets, x 10; pt limited by pain and muscle jerking of LLE. Also, static stretch via 4# weight over knee x 5 minutes   Sit> stand required 3 attempts.  Pt unable to transfer weight forward enough, and always reached with bil hands for RW.  On 3rd attempt, mod/max asist to stand, with bil hands on RW  Pt tolerated standing x 10 seconds with mod assist for balance.  At end of session, pt agreed to sit up until next tx starts.  Set belt alarm set and needs left at hand.     Therapy Documentation Precautions:  Precautions Precautions: Fall Restrictions Weight Bearing Restrictions: Yes RLE Weight Bearing: Weight bearing as tolerated LLE Weight Bearing: Non weight bearing Other Position/Activity Restrictions: NWB LLE  Pain: 6/10 L residual limb; premedicated        Therapy/Group: Individual Therapy  Timothy Faulkner 03/24/2019, 9:59 AM

## 2019-03-24 NOTE — Progress Notes (Signed)
[facility] Progress Note   Subjective:   Patient seen in room. No new concerns. Denies leg pain today. No SOB, CP, abdominal pain, N/V/D.   Objective Vitals:   03/23/19 2003 03/23/19 2210 03/24/19 0403 03/24/19 0429  BP: (!) 82/49 104/72 (!) 104/57   Pulse: 79  81   Resp: 17  17   Temp: 98 F (36.7 C)  97.9 F (36.6 C)   TempSrc: Oral     SpO2: 98%  93%   Weight:    75.6 kg  Height:       Physical Exam General: well developed, alert and in NAD Heart: RRR, no murmurs, rubs or gallops Lungs: CTA bilaterally without wheezing, rhonchi or rales Abdomen: Soft, non-tender, non-distended +BS Extremities: No edema. LBKA stump and R foot wrapped. Dialysis Access: Right IJ TDC,no erythema/drainage  Additional Objective Labs: Basic Metabolic Panel: Recent Labs  Lab 03/21/19 1545 03/22/19 1116 03/23/19 1257  NA 134* 134* 133*  K 4.3 3.8 3.8  CL 92* 96* 94*  CO2 28 24 26   GLUCOSE 89 82 88  BUN 43* 24* 37*  CREATININE 8.11* 5.23* 6.85*  CALCIUM 8.9 9.0 9.0  PHOS 2.9 2.5 3.1   Liver Function Tests: Recent Labs  Lab 03/21/19 1545 03/22/19 1116 03/23/19 1257  ALBUMIN 2.6* 2.6* 2.6*   CBC: Recent Labs  Lab 03/17/19 2341 03/18/19 1339 03/21/19 1545 03/22/19 1116 03/23/19 1256  WBC 7.8 7.4 8.2 8.2 6.8  HGB 9.3* 9.0* 8.9* 8.5* 8.7*  HCT 31.4* 29.9* 30.2* 29.2* 29.4*  MCV 100.0 99.3 101.7* 101.7* 101.0*  PLT 351 372 365 299 287   Blood Culture    Component Value Date/Time   SDES BLOOD RIGHT HAND 03/12/2019 1450   SPECREQUEST  03/12/2019 1450    BOTTLES DRAWN AEROBIC AND ANAEROBIC Blood Culture results may not be optimal due to an inadequate volume of blood received in culture bottles   CULT  03/12/2019 1450    NO GROWTH 5 DAYS Performed at [facility], [place] [address] [place],  [address]    REPTSTATUS 03/17/2019 FINAL 03/12/2019 1450    Cardiac Enzymes: No results for input(s): CKTOTAL, CKMB, CKMBINDEX, TROPONINI in the last  168 hours. CBG: Recent Labs  Lab 03/17/19 1120  GLUCAP 87   Iron Studies:  Recent Labs    03/22/19 0753  IRON 32*  TIBC 140*  FERRITIN 864*   @lablastinr3 @ Studies/Results: No results found. Medications: . ferric gluconate (FERRLECIT/NULECIT) IV Stopped (03/23/19 1643)   . aspirin EC  81 mg Oral Daily  . calcitRIOL  0.5 mcg Oral Q M,W,F-HD  . Chlorhexidine Gluconate Cloth  6 each Topical Q0600  . Chlorhexidine Gluconate Cloth  6 each Topical Q0600  . cinacalcet  30 mg Oral QODAY  . collagenase   Topical Daily  . darbepoetin (ARANESP) injection - DIALYSIS  60 mcg Intravenous Q Wed-HD  . docusate sodium  100 mg Oral BID  . feeding supplement (PRO-STAT SUGAR FREE 64)  30 mL Oral BID  . heparin  5,000 Units Subcutaneous Q8H  . midodrine  10 mg Oral Q M,W,F-HD  . multivitamin  1 tablet Oral QHS  . rosuvastatin  10 mg Oral q1800  . traMADol  50 mg Oral Q12H    Dialysis Orders: 4h 400/800 78kg 3K/2.5Ca bat Hep 5000 +2000 midrun R IJ TDC Calcitriol 0.5  no Mircera or Fe Recent labs: iPTH 248 Ca/P ok hgb 11.8 31% sat  Assessment/Plan: 1. PAD with critical limb ischemia -  prior revascularization in July; seen by [facility] - no further vascular intervention available to improve circulation.SPBKA8/11/2018. On CIR now. 2. Enterobacter wound culture 7/28 left foot - completed course of Zosyn and Vanc - afebrile, s/ BKA 3. ESRD- MWFHD. Next HD 8/14 . Lower EDW as tolerated post BKA.UF goal 2.5L Monday > tol 1.3L.  Weight 75.6kg after HD yesterday.  4. Hypertension/volume- using midodrine for BP support.BP stable today. No edema or SOB. 5. Anemia-hgb8.7- down post op, started aranesp 8/10. Iron sat 23% - give 0.5g IV iron over 4 tx starting 8/12.  6. Metabolic bone disease-Calcium 9.0, corrected 10.1. Lowered dose of calcitriol.  Continue sensipar. Phos 2.5 on 1 auryxia, now on hold and phos 3.1.  7. NutritionAlbumin 2.6. Continue supplement. Renal diet with fluid  restrictions recommended. 8. DM- Per primary 9. Hx NICM s/p ICD  Timothy Paganini, PA-C 03/24/2019, 10:14 AM  [facility] Pager: [phone]

## 2019-03-25 ENCOUNTER — Inpatient Hospital Stay (HOSPITAL_COMMUNITY): Payer: Medicare Other | Admitting: Occupational Therapy

## 2019-03-25 ENCOUNTER — Inpatient Hospital Stay (HOSPITAL_COMMUNITY): Payer: Medicare Other

## 2019-03-25 LAB — RENAL FUNCTION PANEL
Albumin: 2.8 g/dL — ABNORMAL LOW (ref 3.5–5.0)
Anion gap: 15 (ref 5–15)
BUN: 34 mg/dL — ABNORMAL HIGH (ref 8–23)
CO2: 25 mmol/L (ref 22–32)
Calcium: 9.3 mg/dL (ref 8.9–10.3)
Chloride: 96 mmol/L — ABNORMAL LOW (ref 98–111)
Creatinine, Ser: 6.85 mg/dL — ABNORMAL HIGH (ref 0.61–1.24)
GFR calc Af Amer: 8 mL/min — ABNORMAL LOW (ref >60)
GFR calc non Af Amer: 7 mL/min — ABNORMAL LOW (ref >60)
Glucose, Bld: 81 mg/dL (ref 70–99)
Phosphorus: 3.2 mg/dL (ref 2.5–4.6)
Potassium: 3.6 mmol/L (ref 3.5–5.1)
Sodium: 136 mmol/L (ref 135–145)

## 2019-03-25 LAB — CBC
HCT: 30.1 % — ABNORMAL LOW (ref 39.0–52.0)
Hemoglobin: 8.8 g/dL — ABNORMAL LOW (ref 13.0–17.0)
MCH: 30.1 pg (ref 26.0–34.0)
MCHC: 29.2 g/dL — ABNORMAL LOW (ref 30.0–36.0)
MCV: 103.1 fL — ABNORMAL HIGH (ref 80.0–100.0)
Platelets: 279 10*3/uL (ref 150–400)
RBC: 2.92 MIL/uL — ABNORMAL LOW (ref 4.22–5.81)
RDW: 18.4 % — ABNORMAL HIGH (ref 11.5–15.5)
WBC: 6.9 10*3/uL (ref 4.0–10.5)
nRBC: 0 % (ref 0.0–0.2)

## 2019-03-25 MED ORDER — HEPARIN SODIUM (PORCINE) 1000 UNIT/ML IJ SOLN
INTRAMUSCULAR | Status: AC
  Filled 2019-03-25: qty 5

## 2019-03-25 MED ORDER — NEPRO/CARBSTEADY PO LIQD
237.0000 mL | Freq: Two times a day (BID) | ORAL | Status: DC
  Administered 2019-03-30: 237 mL via ORAL

## 2019-03-25 MED ORDER — CALCITRIOL 0.5 MCG PO CAPS
ORAL_CAPSULE | ORAL | Status: AC
  Filled 2019-03-25: qty 1

## 2019-03-25 NOTE — Progress Notes (Signed)
Occupational Therapy Session Note  Patient Details  Name: Timothy Faulkner MRN: [id number] Date of Birth: [birth year]  Today's Date: 03/25/2019 OT Individual Time: 3818-2993 OT Individual Time Calculation (min): 60 min    Short Term Goals: Week 2:  OT Short Term Goal 1 (Week 2): Pt will thread BLE into pants wiht S and AE PRN OT Short Term Goal 2 (Week 2): Pt will maintain standing balance with min A for clothing management OT Short Term Goal 3 (Week 2): Pt will perform toilet transfer with min A  Skilled Therapeutic Interventions/Progress Updates:    Treatment session with focus on functional transfers and RLE there ex.  Pt received supine in bed with no c/o pain.  Pt declined bathing/dressing this session but agreeable to therapy session.  Pt completed bed mobility with min-mod assist and mod cues for sequencing and improved positioning to decrease burden of care.  Completed squat pivot transfers min-mod assist, fluctuating based on hand placement and slow processing.  Engaged in there ex in supine and sidelying with focus on hip flexion, hip adduction, and knee extension.  Pt required intermittent cues for proper technique.  Noted pt to have increased difficulty with hip extension and knee flexion - will continue to benefit from there ex/stretching.  Provided education on importance of hip and knee mobility for functional mobility progression.  Propelled w/c back to room, educated on BUE strengthening and endurance for mobility and transfers.  Pt remained upright in w/c with seat belt alarm on, breakfast tray setup, and all needs in reach.  Therapy Documentation Precautions:  Precautions Precautions: Fall Restrictions Weight Bearing Restrictions: Yes RLE Weight Bearing: Weight bearing as tolerated LLE Weight Bearing: Non weight bearing Other Position/Activity Restrictions: NWB LLE General:   Vital Signs: Therapy Vitals Temp: 98.7 F (37.1 C) Pulse Rate: 80 Resp: 17 BP:  132/72 Patient Position (if appropriate): Lying Oxygen Therapy SpO2: 100 % O2 Device: Room Air Pain: Pain Assessment Pain Score: 2   Therapy/Group: Individual Therapy  Simonne Come 03/25/2019, 8:28 AM

## 2019-03-25 NOTE — Progress Notes (Signed)
Occupational Therapy Session Note  Patient Details  Name: DOYCE SALING MRN: [id number] Date of Birth: 1945/10/23  Today's Date: 03/25/2019 OT Individual Time: 1120-1200 OT Individual Time Calculation (min): 40 min    Short Term Goals: Week 1:  OT Short Term Goal 1 (Week 1): Pt will sit to stand consistently with MOD A for prep of clothing managment OT Short Term Goal 1 - Progress (Week 1): Met OT Short Term Goal 2 (Week 1): Pt will thread BLE into pants wiht S and AE PRN OT Short Term Goal 2 - Progress (Week 1): Progressing toward goal OT Short Term Goal 3 (Week 1): Pt will transfer to [facility] with MOD A and LRAD OT Short Term Goal 3 - Progress (Week 1): Met Week 2:  OT Short Term Goal 1 (Week 2): Pt will thread BLE into pants wiht S and AE PRN OT Short Term Goal 2 (Week 2): Pt will maintain standing balance with min A for clothing management OT Short Term Goal 3 (Week 2): Pt will perform toilet transfer with min A  Skilled Therapeutic Interventions/Progress Updates:    Pt received in bed and was not eager to get OOB.  He did want to have his hair washed so was then very compliant with getting up to go to the sink. Sat to EOB with close S and transferred to [facility] with squat pivot with CGA.  He was positioned at sink with hair washing tray. Pt held onto tray strap to prevent it from sliding while therapist washed and conditioned hair. He then used a hair dryer.  Pt stated he felt much better.  Pt in w/c with belt alarm on and all needs met.   Therapy Documentation Precautions:  Precautions Precautions: Fall Restrictions Weight Bearing Restrictions: Yes RLE Weight Bearing: Weight bearing as tolerated LLE Weight Bearing: Non weight bearing Other Position/Activity Restrictions: NWB LLE Therapy Vitals Temp: (!) 97.5 F (36.4 C) Temp Source: Oral Pulse Rate: 84 Resp: 16 BP: (!) 95/55 Patient Position (if appropriate): Lying Oxygen Therapy O2 Device: Room Air Pain: Pain  Assessment  Faces Pain Scale: Hurts whole lot Pain Type: Surgical pain Pain Location: Leg Pain Orientation: Left Pain Descriptors / Indicators: Aching Pain Frequency: Constant Pain Intervention(s): Medication (See eMAR)   Therapy/Group: Individual Therapy  [place] 03/25/2019, 1:08 PM

## 2019-03-25 NOTE — Progress Notes (Signed)
[facility] Progress Note   Subjective:  Seen in room. Tired after therpay this am. For HD today.   Objective Vitals:   03/24/19 0429 03/24/19 1427 03/24/19 1925 03/25/19 0452  BP:  (!) 96/42 100/88 132/72  Pulse:  86 84 80  Resp:  18 17 17   Temp:  97.7 F (36.5 C) 98 F (36.7 C) 98.7 F (37.1 C)  TempSrc:  Oral    SpO2:  100%  100%  Weight: 75.6 kg     Height:       Physical Exam General: well developed, alert and in NAD Heart: RRR, no murmurs, rubs or gallops Lungs: CTA bilaterally without wheezing, rhonchi or rales Abdomen: Soft, non-tender, non-distended +BS Extremities: No edema. LBKA stump and R foot wrapped. Dialysis Access: Right IJ TDC,no erythema/drainage  Additional Objective Labs: Basic Metabolic Panel: Recent Labs  Lab 03/21/19 1545 03/22/19 1116 03/23/19 1257  NA 134* 134* 133*  K 4.3 3.8 3.8  CL 92* 96* 94*  CO2 28 24 26   GLUCOSE 89 82 88  BUN 43* 24* 37*  CREATININE 8.11* 5.23* 6.85*  CALCIUM 8.9 9.0 9.0  PHOS 2.9 2.5 3.1   Liver Function Tests: Recent Labs  Lab 03/21/19 1545 03/22/19 1116 03/23/19 1257  ALBUMIN 2.6* 2.6* 2.6*   CBC: Recent Labs  Lab 03/18/19 1339 03/21/19 1545 03/22/19 1116 03/23/19 1256  WBC 7.4 8.2 8.2 6.8  HGB 9.0* 8.9* 8.5* 8.7*  HCT 29.9* 30.2* 29.2* 29.4*  MCV 99.3 101.7* 101.7* 101.0*  PLT 372 365 299 287   Blood Culture    Component Value Date/Time   SDES BLOOD RIGHT HAND 03/12/2019 1450   SPECREQUEST  03/12/2019 1450    BOTTLES DRAWN AEROBIC AND ANAEROBIC Blood Culture results may not be optimal due to an inadequate volume of blood received in culture bottles   CULT  03/12/2019 1450    NO GROWTH 5 DAYS Performed at [facility] [address] [place], [place] [address]    REPTSTATUS 03/17/2019 FINAL 03/12/2019 1450    Cardiac Enzymes: No results for input(s): CKTOTAL, CKMB, CKMBINDEX, TROPONINI in the last 168 hours. CBG: No results for input(s): GLUCAP in the  last 168 hours. Iron Studies:  No results for input(s): IRON, TIBC, TRANSFERRIN, FERRITIN in the last 72 hours. @lablastinr3 @ Studies/Results: No results found. Medications: . ferric gluconate (FERRLECIT/NULECIT) IV Stopped (03/23/19 1643)   . aspirin EC  81 mg Oral Daily  . calcitRIOL  0.5 mcg Oral Q M,W,F-HD  . Chlorhexidine Gluconate Cloth  6 each Topical Q0600  . Chlorhexidine Gluconate Cloth  6 each Topical Q0600  . cinacalcet  30 mg Oral QODAY  . collagenase   Topical Daily  . darbepoetin (ARANESP) injection - DIALYSIS  60 mcg Intravenous Q Wed-HD  . docusate sodium  100 mg Oral BID  . feeding supplement (PRO-STAT SUGAR FREE 64)  30 mL Oral BID  . heparin  5,000 Units Subcutaneous Q8H  . midodrine  10 mg Oral Q M,W,F-HD  . multivitamin  1 tablet Oral QHS  . rosuvastatin  10 mg Oral q1800  . traMADol  50 mg Oral Q12H    Dialysis Orders: 4h 400/800 78kg 3K/2.5Ca bat Hep 5000 +2000 midrun R IJ TDC Calcitriol 0.5  no Mircera or Fe Recent labs: iPTH 248 Ca/P ok hgb 11.8 31% sat  Assessment/Plan: 1. PAD with critical limb ischemia - prior revascularization in July; seen by [facility] - no further vascular intervention available to improve circulation.SPBKA8/11/2018. On  CIR now. 2. Enterobacter wound culture 7/28 left foot - completed course of Zosyn and Vanc - afebrile, s/p L BKA 3. ESRD- MWFHD. HD today on schedule. 4. Hypertension/volume- using midodrine for BP support. No edema or SOB.Will have lower HD at discharge s/p BKA.  5. Anemia-hgb8.7- down post op, started aranesp 8/10. Iron sat 23% - getting  0.5g IV iron over 4 tx starting 8/12.  6. Metabolic bone disease-Calcium 9.0, corrected 10.1. Lowered dose of calcitriol.  Continue sensipar. Auryxia binder on hold d/t low phos -monitor .  7. NutritionAlbumin 2.6. Continue supplement. Renal diet with fluid restrictions recommended. 8. DM- Per primary 9. Hx NICM s/p ICD  Lynnda Child PA-C [facility]  [facility] Pager [phone] 03/25/2019,10:48 AM

## 2019-03-25 NOTE — Progress Notes (Signed)
Physical Therapy Session Note  Patient Details  Name: Timothy Faulkner MRN: [id number] Date of Birth: Dec 20, 1945  Today's Date: 03/25/2019 PT Individual Time: 0920-1035 PT Individual Time Calculation (min): 75 min   Short Term Goals: Week 2:  PT Short Term Goal 1 (Week 2): = LTGs due to LOS  Skilled Therapeutic Interventions/Progress Updates:     Patient in w/c with NT in room upon PT arrival. Patient alert and agreeable to PT session, requesting to go the bathroom at beginning of session. PT retrieved drop arm BSC.   Therapeutic Activity: Bed Mobility: Patient performed sit to supine with supervision. Provided verbal cues for lying on his side then rolling to his back. Transfers: Patient performed a squat pivot transfer from w/c to drop arm BSC over the toilet with min A and cues for technique and hand placement. Required mod A for doffing/donning shorts sitting on BSC performing lateral lean and offweighting bottom with B UEs and supervision/set-up for peri-care. Performed a car transfer set to the height of a [employer] with min A to get in and CGA to get out with cues for hand placement and proper technique.  Performed stand pivot x1 with RW with min A-CGA. Provided verbal cues for hand placement on RW, maintaining heel weight bearing on R foot, and pivot technique.  Wheelchair Mobility:  Patient propelled wheelchair 55 feet x2 with supervision and set-up assist and went up/down a ramp with min A-CGA. Provided verbal cues for turning technique, increased stroke length for increased momentum with propulsion, use of breaks throughout session, w/c set-up for transfers, leaning forward when going up the ramp and down when coming down, and use of hands as breaks to slow descent on the ramp.   Therapeutic Exercise: Patient performed the following exercises with verbal and tactile cues for proper technique. -L SLR 2x10  Patient in bed at end of session with breaks locked, bed alarm set, and  all needs within reach. Educated patient on alternating positions throughout day for pressure relief and promoting knee extension in sitting and lying. He continues to require increased time for all mobility.   Therapy Documentation Precautions:  Precautions Precautions: Fall Restrictions Weight Bearing Restrictions: Yes RLE Weight Bearing: Weight bearing as tolerated LLE Weight Bearing: Non weight bearing Other Position/Activity Restrictions: NWB LLE Pain: Patient denied pain throughout session.    Therapy/Group: Individual Therapy  Khloei Spiker L Isaac Dubie PT, DPT  03/25/2019, 12:35 PM

## 2019-03-25 NOTE — Progress Notes (Signed)
Initial Nutrition Assessment  DOCUMENTATION CODES:   Not applicable  INTERVENTION:   - Nepro Shake po BID, each supplement provides 425 kcal and 19 grams protein  - Continue Pro-stat 30 ml BID, each supplement provides 100 kcal and 15 grams of protein  - Continue renal MVI daily  NUTRITION DIAGNOSIS:   Increased nutrient needs related to wound healing, chronic illness (ESRD on HD) as evidenced by estimated needs.  GOAL:   Patient will meet greater than or equal to 90% of their needs  MONITOR:   PO intake, Supplement acceptance, Labs, Weight trends, I & O's, Skin  REASON FOR ASSESSMENT:   Malnutrition Screening Tool    ASSESSMENT:   73 year old male with PMH of HTN, NICM s/p AICD, ESRD on HD MWF, PVD with multiple bilateral foot wounds s/p recent revascularization and atherectomy with left 3rd and 4th toe amputations with poor healing. Pt was admitted on 03/12/19 with progressive ischemic changes. Pt was started on IV antibiotics in hopes of limb salvage. Pt underwent left BKA on 8/04.  Noted planned d/c date of 8/20.  Post-HD weight on 8/12: 75.2 kg  Spoke with pt and daughter at bedside. Pt reports that he has a good appetite but hasn't been eating well during this admission due to hating the food. Pt states that he really does not like the food that he is sent but he is making himself eat some.  Pt reports that he is followed by a nutritionist at his outpatient HD facility. Pt is looking forward to discharging "next week."  Pt states he is taking Pro-stat and is willing to consume Nepro as well. RD to order. RD encouraged pt to consume as much as he can at meals to prevent dry weight loss and promote wound healing. Pt expresses understanding.  Pt is unaware of any recent weight loss. Reviewed weight history in chart. Pt with an 8 lb weight loss since admission to CIR on 8/06. This is a 4.6% weight loss in less than 2 weeks which is significant for timeframe. Difficult  to determine whether weight loss is dry weight loss or related to fluid status. Will continue to monitor trends.  Meal Completion: 0-100% x last 8 meals (averaging 61%)  Medications reviewed and include: calcitriol, Sensipar, Aranesp, Colace, Pro-stat 30 ml BID, rena-vit, ferric gluconate with HD  Labs reviewed: sodium 133, iron 32, hemoglobin 8.7  NUTRITION - FOCUSED PHYSICAL EXAM:  Unable to complete at this time. Visit cut short due to OT arriving for therapy. Will re-attempt at follow-up.  Diet Order:   Diet Order            Diet renal with fluid restriction Fluid restriction: 1200 mL Fluid; Room service appropriate? Yes; Fluid consistency: Thin  Diet effective now              EDUCATION NEEDS:   Education needs have been addressed  Skin:  Skin Assessment: Skin Integrity Issues: Incisions: left leg s/p BKA, right foot  Last BM:  03/22/19  Height:   Ht Readings from Last 1 Encounters:  03/17/19 5\' 8"  (1.727 m)    Weight:   Wt Readings from Last 1 Encounters:  03/25/19 78 kg    Ideal Body Weight:  65.5 kg (adjusted for left BKA)  BMI:  Body mass index is 26.15 kg/m.  Estimated Nutritional Needs:   Kcal:  2200-2400  Protein:  115-130 grams  Fluid:  1000 ml + UOP    Gaynell Face, MS, RD, LDN  Inpatient Clinical Dietitian Pager: [phone] Weekend/After Hours: [phone]

## 2019-03-25 NOTE — Progress Notes (Signed)
[place] PHYSICAL MEDICINE & REHABILITATION PROGRESS NOTE   Subjective/Complaints: Pt up in w/c. No problems. Rolling himself down to therapy  ROS: Patient denies fever, rash, sore throat, blurred vision, nausea, vomiting, diarrhea, cough, shortness of breath or chest pain, joint or back pain, headache, or mood change.    Objective:   No results found. Recent Labs    03/23/19 1256  WBC 6.8  HGB 8.7*  HCT 29.4*  PLT 287   Recent Labs    03/23/19 1257  NA 133*  K 3.8  CL 94*  CO2 26  GLUCOSE 88  BUN 37*  CREATININE 6.85*  CALCIUM 9.0    Intake/Output Summary (Last 24 hours) at 03/25/2019 1158 Last data filed at 03/24/2019 2116 Gross per 24 hour  Intake 420 ml  Output -  Net 420 ml     Physical Exam: Vital Signs Blood pressure 132/72, pulse 80, temperature 98.7 F (37.1 C), resp. rate 17, height 5\' 8"  (1.727 m), weight 75.6 kg, SpO2 100 %. Constitutional: No distress . Vital signs reviewed. HEENT: EOMI, oral membranes moist Neck: supple Cardiovascular: RRR without murmur. No JVD    Respiratory: CTA Bilaterally without wheezes or rales. Normal effort    GI: BS +, non-tender, non-distended  Skin: Warm and dry.  Intact. Psych: Normal mood.  Normal behavior. Musc: left BK tender but better Neuro: Alert Motor: Strength 4+/5 RUE and RLE LLE >/3/5, limited by pain Skin: Dressing to b/l LE C/D/I Right Foot dry, 5th toe amp site clean,intact. skin otherwise intact RLE, left BK incision CDI with staples in place. Minimal drainage Psychiatric: pleasant.   Assessment/Plan: 1. 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  Physiatrist is providing close team supervision and 24 hour management of active medical problems listed below.  Physiatrist and rehab team continue to assess barriers to discharge/monitor patient progress  toward functional and medical goals  Care Tool:  Bathing    Body parts bathed by patient: Right arm, Left arm, Abdomen, Chest, Front perineal area, Left upper leg, Right upper leg, Buttocks, Face   Body parts bathed by helper: Buttocks Body parts n/a: Right lower leg, Left lower leg   Bathing assist Assist Level: Minimal Assistance - Patient > 75%     Upper Body Dressing/Undressing Upper body dressing   What is the patient wearing?: Pull over shirt    Upper body assist Assist Level: Set up assist    Lower Body Dressing/Undressing Lower body dressing      What is the patient wearing?: Pants     Lower body assist Assist for lower body dressing: Minimal Assistance - Patient > 75%     Toileting Toileting Toileting Activity did not occur (Clothing management and hygiene only): N/A (no void or bm)  Toileting assist Assist for toileting: Moderate Assistance - Patient 50 - 74%     Transfers Chair/bed transfer  Transfers assist     Chair/bed transfer assist level: Contact Guard/Touching assist     Locomotion Ambulation   Ambulation assist   Ambulation activity did not occur: Safety/medical concerns(decreased strength/activity tolerance)          Walk 10 feet activity   Assist  Walk 10 feet activity did not occur: Safety/medical concerns(decreased strength/activity tolerance)        Walk 50 feet activity   Assist Walk 50 feet with 2 turns activity did  not occur: Safety/medical concerns(decreased strength/activity tolerance)         Walk 150 feet activity   Assist Walk 150 feet activity did not occur: Safety/medical concerns(decreased strength/activity tolerance)         Walk 10 feet on uneven surface  activity   Assist Walk 10 feet on uneven surfaces activity did not occur: Safety/medical concerns(decreased strength/activity tolerance)         Wheelchair     Assist Will patient use wheelchair at discharge?: Yes Type of  Wheelchair: Manual    Wheelchair assist level: Set up assist, Supervision/Verbal cueing Max wheelchair distance: 34'    Wheelchair 50 feet with 2 turns activity    Assist        Assist Level: Set up assist, Supervision/Verbal cueing   Wheelchair 150 feet activity     Assist Wheelchair 150 feet activity did not occur: Safety/medical concerns(decreased strength/activity tolerance)       Plan: 1.  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  Cont CIR 2.  Antithrombotics: -DVT/anticoagulation:  Pharmaceutical: Heparin due to ESRD on HD             -antiplatelet therapy: Low dose ASA 3. Pain Management: Due to L BKA, phantom pain, residual limb pain, and R MTP amputation residual limb pain, esp after revascularization procedure.  Continue to stress importance of edema mgt/snug ACE wrap/manual desensitization, etc to help decreased stump pain.   Increased Oxycodone to 5 mg q6 hours prn and added a tramadol 50 mg BID for more steady pain control   Stable on 8/14 4. Mood: LCSW to follow for evaluation and support.              -antipsychotic agents: NA 5. Neuropsych: This patient is capable of making decisions on his own behalf at this time,  -follow for cognition  -consider neuro-psych assessment  -?baseline 6. Skin/Wound Care: Monitor right foot wound --continue santyl with damp to dry dressing on. 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   Cont dressing to left BKA, will consider shrinker this coming week 7. Fluids/Electrolytes/Nutrition: Strict I/O's although anuric--1200 cc FR/day. Labs with HD- M/W/F-  8. ESRD: Cont HD MWF at end of the day to help with tolerance of therapy. Will not change to T/H/S unless pt cannot tolerate current schedule.   9. Chronic hypotension with previous hx of HTN:  Continue midodrine on HD days. 10 mg dose.  Inconsistent control which is likely related to HD 10. PAD/right foot ulcer s/p graft: WBAT with darco shoe-   8/12- according to Podiatry, can WBAT in surgical shoe 11. Anemia of chronic disease: Continue to monitor--ESA/Fe per nephrology -   Hb 8.7 on 8/12  Cont to monitor  -follow up labs with HD 12. Hypoalbuminemia: Continue supplement to promote healing.  87. Dispo- will discharge to home, with family.    d/c date set for 8/20  14. Drug induced constipation  Improving overall  LOS: 8 days A FACE TO FACE EVALUATION WAS PERFORMED  Meredith Staggers 03/25/2019, 11:58 AM

## 2019-03-26 NOTE — Progress Notes (Signed)
[place] PHYSICAL MEDICINE & REHABILITATION PROGRESS NOTE   Subjective/Complaints:  No significant stump or phantom pain   ROS: Patient denies, nausea, vomiting, diarrhea, cough, , joint or back pain, headache, or mood change.    Objective:   No results found. Recent Labs    03/23/19 1256 03/25/19 1230  WBC 6.8 6.9  HGB 8.7* 8.8*  HCT 29.4* 30.1*  PLT 287 279   Recent Labs    03/23/19 1257 03/25/19 1230  NA 133* 136  K 3.8 3.6  CL 94* 96*  CO2 26 25  GLUCOSE 88 81  BUN 37* 34*  CREATININE 6.85* 6.85*  CALCIUM 9.0 9.3    Intake/Output Summary (Last 24 hours) at 03/26/2019 0924 Last data filed at 03/26/2019 0855 Gross per 24 hour  Intake 360 ml  Output 1800 ml  Net -1440 ml     Physical Exam: Vital Signs Blood pressure 106/67, pulse 80, temperature 97.6 F (36.4 C), resp. rate 16, height 5\' 8"  (1.727 m), weight 74.1 kg, SpO2 93 %. Constitutional: No distress . Vital signs reviewed. HEENT: EOMI, oral membranes moist Neck: supple Cardiovascular: RRR without murmur. No JVD    Respiratory: CTA Bilaterally without wheezes or rales. Normal effort    GI: BS +, non-tender, non-distended  Skin: Warm and dry.  Intact. Psych: Normal mood.  Normal behavior. Musc: left BK tender but better Neuro: Alert Motor: Strength 4+/5 RUE and RLE LLE >/3/5, limited by pain Skin: Dressing to b/l LE C/D/I Right Foot dry, 5th toe amp site clean,intact. skin otherwise intact RLE, left BK incision CDI with staples in place. Minimal drainage Psychiatric: pleasant.   Assessment/Plan: 1. 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  Physiatrist is providing close team supervision and 24 hour management of active medical problems listed below.  Physiatrist and rehab team continue to assess barriers to discharge/monitor patient progress  toward functional and medical goals  Care Tool:  Bathing    Body parts bathed by patient: Right arm, Left arm, Abdomen, Chest, Front perineal area, Left upper leg, Right upper leg, Buttocks, Face   Body parts bathed by helper: Buttocks Body parts n/a: Right lower leg, Left lower leg   Bathing assist Assist Level: Minimal Assistance - Patient > 75%     Upper Body Dressing/Undressing Upper body dressing   What is the patient wearing?: Pull over shirt    Upper body assist Assist Level: Set up assist    Lower Body Dressing/Undressing Lower body dressing      What is the patient wearing?: Pants     Lower body assist Assist for lower body dressing: Minimal Assistance - Patient > 75%     Toileting Toileting Toileting Activity did not occur (Clothing management and hygiene only): N/A (no void or bm)  Toileting assist Assist for toileting: Moderate Assistance - Patient 50 - 74%     Transfers Chair/bed transfer  Transfers assist     Chair/bed transfer assist level: Contact Guard/Touching assist     Locomotion Ambulation   Ambulation assist   Ambulation activity did not occur: Safety/medical concerns(decreased strength/activity tolerance)          Walk 10 feet activity   Assist  Walk 10 feet activity did not occur: Safety/medical concerns(decreased strength/activity tolerance)        Walk 50 feet activity   Assist Walk 50 feet with 2  turns activity did not occur: Safety/medical concerns(decreased strength/activity tolerance)         Walk 150 feet activity   Assist Walk 150 feet activity did not occur: Safety/medical concerns(decreased strength/activity tolerance)         Walk 10 feet on uneven surface  activity   Assist Walk 10 feet on uneven surfaces activity did not occur: Safety/medical concerns(decreased strength/activity tolerance)         Wheelchair     Assist Will patient use wheelchair at discharge?: Yes Type of  Wheelchair: Manual    Wheelchair assist level: Supervision/Verbal cueing, Set up assist Max wheelchair distance: 12'    Wheelchair 50 feet with 2 turns activity    Assist        Assist Level: Set up assist, Supervision/Verbal cueing   Wheelchair 150 feet activity     Assist Wheelchair 150 feet activity did not occur: Safety/medical concerns(decreased strength/activity tolerance)       Plan: 1.  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  Cont CIR PT, OT  2.  Antithrombotics: -DVT/anticoagulation:  Pharmaceutical: Heparin due to ESRD on HD             -antiplatelet therapy: Low dose ASA 3. Pain Management: Due to L BKA, phantom pain, residual limb pain, and R MTP amputation residual limb pain, esp after revascularization procedure.  Continue to stress importance of edema mgt/snug ACE wrap/manual desensitization, etc to help decreased stump pain.   Increased Oxycodone to 5 mg q6 hours prn and added a tramadol 50 mg BID for more steady pain control   Stable on 8/14 4. Mood: LCSW to follow for evaluation and support.              -antipsychotic agents: NA 5. Neuropsych: This patient is capable of making decisions on his own behalf at this time,  -follow for cognition  -consider neuro-psych assessment  -?baseline 6. Skin/Wound Care: Monitor right foot wound --continue santyl with damp to dry dressing on. 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   Cont dressing to left BKA, will consider shrinker this coming week 7. Fluids/Electrolytes/Nutrition: Strict I/O's although anuric--1200 cc FR/day. Labs with HD- M/W/F-  8. ESRD: Cont HD MWF at end of the day to help with tolerance of therapy. Will not change to T/H/S unless pt cannot tolerate current schedule.   9. Chronic hypotension with previous hx of  HTN: Continue midodrine on HD days. 10 mg dose.  Inconsistent control which is likely related to HD 10. PAD/right foot ulcer s/p graft: WBAT with darco shoe-   8/12- according to Podiatry, can WBAT in surgical shoe 11. Anemia of chronic disease: Continue to monitor--ESA/Fe per nephrology -   Hb 8.7 on 8/12  Cont to monitor  -follow up labs with HD 12. Hypoalbuminemia: Continue supplement to promote healing.  73. Dispo- will discharge to home, with family.    d/c date set for 8/20  14. Drug induced constipation  Improving overall- no bowel complaints today   LOS: 9 days A FACE TO FACE EVALUATION WAS PERFORMED  Charlett Blake 03/26/2019, 9:24 AM

## 2019-03-26 NOTE — Progress Notes (Signed)
[facility] Progress Note   Subjective:  Seen in room, ate good breakfast.  No c/o this AM.  HD went fine yesterday.  UF 1.8L   Objective Vitals:   03/25/19 1630 03/25/19 1633 03/25/19 2028 03/26/19 0557  BP: 108/62 124/68 (!) 142/102 106/67  Pulse: 90 90 85 80  Resp:  16  16  Temp:  97.6 F (36.4 C) 97.7 F (36.5 C) 97.6 F (36.4 C)  TempSrc:  Oral Oral   SpO2:  97% 100% 93%  Weight:  76.5 kg  74.1 kg  Height:       Physical Exam General: well developed, alert and in NAD Heart: RRR, no murmurs, rubs or gallops Lungs: CTA bilaterally without wheezing, rhonchi or rales Abdomen: Soft, non-tender, non-distended +BS Extremities: No edema. LBKA stump and R foot wrapped. Dialysis Access: Right IJ TDC,no erythema/drainage  Additional Objective Labs: Basic Metabolic Panel: Recent Labs  Lab 03/22/19 1116 03/23/19 1257 03/25/19 1230  NA 134* 133* 136  K 3.8 3.8 3.6  CL 96* 94* 96*  CO2 24 26 25   GLUCOSE 82 88 81  BUN 24* 37* 34*  CREATININE 5.23* 6.85* 6.85*  CALCIUM 9.0 9.0 9.3  PHOS 2.5 3.1 3.2   Liver Function Tests: Recent Labs  Lab 03/22/19 1116 03/23/19 1257 03/25/19 1230  ALBUMIN 2.6* 2.6* 2.8*   CBC: Recent Labs  Lab 03/21/19 1545 03/22/19 1116 03/23/19 1256 03/25/19 1230  WBC 8.2 8.2 6.8 6.9  HGB 8.9* 8.5* 8.7* 8.8*  HCT 30.2* 29.2* 29.4* 30.1*  MCV 101.7* 101.7* 101.0* 103.1*  PLT 365 299 287 279   Blood Culture    Component Value Date/Time   SDES BLOOD RIGHT HAND 03/12/2019 1450   SPECREQUEST  03/12/2019 1450    BOTTLES DRAWN AEROBIC AND ANAEROBIC Blood Culture results may not be optimal due to an inadequate volume of blood received in culture bottles   CULT  03/12/2019 1450    NO GROWTH 5 DAYS Performed at [facility], [place] [address], [place], [place] [address]    REPTSTATUS 03/17/2019 FINAL 03/12/2019 1450    Cardiac Enzymes: No results for input(s): CKTOTAL, CKMB, CKMBINDEX, TROPONINI in the last 168  hours. CBG: No results for input(s): GLUCAP in the last 168 hours. Iron Studies:  No results for input(s): IRON, TIBC, TRANSFERRIN, FERRITIN in the last 72 hours. @lablastinr3 @ Studies/Results: No results found. Medications: . ferric gluconate (FERRLECIT/NULECIT) IV Stopped (03/25/19 1659)   . aspirin EC  81 mg Oral Daily  . calcitRIOL  0.5 mcg Oral Q M,W,F-HD  . Chlorhexidine Gluconate Cloth  6 each Topical Q0600  . Chlorhexidine Gluconate Cloth  6 each Topical Q0600  . cinacalcet  30 mg Oral QODAY  . collagenase   Topical Daily  . darbepoetin (ARANESP) injection - DIALYSIS  60 mcg Intravenous Q Wed-HD  . docusate sodium  100 mg Oral BID  . feeding supplement (NEPRO CARB STEADY)  237 mL Oral BID BM  . feeding supplement (PRO-STAT SUGAR FREE 64)  30 mL Oral BID  . heparin  5,000 Units Subcutaneous Q8H  . midodrine  10 mg Oral Q M,W,F-HD  . multivitamin  1 tablet Oral QHS  . rosuvastatin  10 mg Oral q1800  . traMADol  50 mg Oral Q12H    Dialysis Orders: 4h 400/800 78kg 3K/2.5Ca bat Hep 5000 +2000 midrun R IJ TDC Calcitriol 0.5  no Mircera or Fe Recent labs: iPTH 248 Ca/P ok hgb 11.8 31% sat  Assessment/Plan: 1. PAD  with critical limb ischemia - prior revascularization in July; seen by [facility] - no further vascular intervention available to improve circulation.SPBKA8/11/2018. On CIR now. 2. Enterobacter wound culture 7/28 left foot - completed course of Zosyn and Vanc - afebrile, s/p L BKA 3. ESRD- MWFHD. HD today on yesterday, next Monday. 4. Hypertension/volume- using midodrine for BP support. No edema or SOB.Will have lower HD at discharge s/p BKA.  5. Anemia-hgb8.8- down post op, started aranesp 8/10. Iron sat 23% - getting  0.5g IV iron over 4 tx starting 8/12.  6. Metabolic bone disease-Calcium 9.0, corrected 10.1. Lowered dose of calcitriol.  Continue sensipar. Auryxia binder on hold d/t low phos -monitor .  7. NutritionAlbumin 2.8. Continue  supplement. Renal diet with fluid restrictions recommended. 8. DM- Per primary 9. Hx NICM s/p ICD 10. Dispo: EDD 8/20  Lynnda Child PA-C [facility] Pager [phone] 03/26/2019,9:19 AM

## 2019-03-27 ENCOUNTER — Inpatient Hospital Stay (HOSPITAL_COMMUNITY): Payer: Medicare Other

## 2019-03-27 MED ORDER — DOCUSATE SODIUM 100 MG PO CAPS
200.0000 mg | ORAL_CAPSULE | Freq: Two times a day (BID) | ORAL | Status: DC
  Administered 2019-03-27 – 2019-03-31 (×8): 200 mg via ORAL
  Filled 2019-03-27 (×8): qty 2

## 2019-03-27 NOTE — Progress Notes (Signed)
Timothy Faulkner PHYSICAL MEDICINE & REHABILITATION PROGRESS NOTE   Subjective/Complaints:  Stump pain this a.m. just took medication during physical therapy.  No problems overnight slept well.  ROS: Patient denies, nausea, vomiting, diarrhea, cough, , joint or back pain, headache, or mood change.    Objective:   No results found. Recent Labs    03/25/19 1230  WBC 6.9  HGB 8.8*  HCT 30.1*  PLT 279   Recent Labs    03/25/19 1230  NA 136  K 3.6  CL 96*  CO2 25  GLUCOSE 81  BUN 34*  CREATININE 6.85*  CALCIUM 9.3    Intake/Output Summary (Last 24 hours) at 03/27/2019 0933 Last data filed at 03/27/2019 0830 Gross per 24 hour  Intake 582 ml  Output -  Net 582 ml     Physical Exam: Vital Signs Blood pressure 93/67, pulse 82, temperature 98.2 F (36.8 C), temperature source Oral, resp. rate 16, height 5\' 8"  (1.727 m), weight 75.1 kg, SpO2 93 %. Constitutional: No distress . Vital signs reviewed. HEENT: EOMI, oral membranes moist Neck: supple Cardiovascular: RRR without murmur. No JVD    Respiratory: CTA Bilaterally without wheezes or rales. Normal effort    GI: BS +, non-tender, non-distended  Skin: Warm and dry.  Intact. Psych: Normal mood.  Normal behavior. Musc: left BK tender but better Neuro: Alert Motor: Strength 4+/5 RUE and RLE LLE >/3/5, limited by pain Skin: Dressing to b/l LE C/D/I Right Foot dry, 5th toe amp site clean,intact. skin otherwise intact RLE, left BK incision CDI with staples in place. Minimal drainage Psychiatric: pleasant.   Assessment/Plan: 1. 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  Physiatrist is providing close team supervision and 24 hour management of active medical problems listed below.  Physiatrist and rehab team continue to assess barriers to discharge/monitor patient progress toward  functional and medical goals  Care Tool:  Bathing    Body parts bathed by patient: Right arm, Left arm, Abdomen, Chest, Front perineal area, Left upper leg, Right upper leg, Buttocks, Face   Body parts bathed by helper: Buttocks Body parts n/a: Right lower leg, Left lower leg   Bathing assist Assist Level: Minimal Assistance - Patient > 75%     Upper Body Dressing/Undressing Upper body dressing   What is the patient wearing?: Pull over shirt    Upper body assist Assist Level: Set up assist    Lower Body Dressing/Undressing Lower body dressing      What is the patient wearing?: Pants     Lower body assist Assist for lower body dressing: Minimal Assistance - Patient > 75%     Toileting Toileting Toileting Activity did not occur (Clothing management and hygiene only): N/A (no void or bm)  Toileting assist Assist for toileting: Moderate Assistance - Patient 50 - 74%     Transfers Chair/bed transfer  Transfers assist     Chair/bed transfer assist level: Contact Guard/Touching assist     Locomotion Ambulation   Ambulation assist   Ambulation activity did not occur: Safety/medical concerns(decreased strength/activity tolerance)          Walk 10 feet activity   Assist  Walk 10 feet activity did not occur: Safety/medical concerns(decreased strength/activity tolerance)        Walk 50 feet activity   Assist Walk 50 feet with 2 turns activity did not occur:  Safety/medical concerns(decreased strength/activity tolerance)         Walk 150 feet activity   Assist Walk 150 feet activity did not occur: Safety/medical concerns(decreased strength/activity tolerance)         Walk 10 feet on uneven surface  activity   Assist Walk 10 feet on uneven surfaces activity did not occur: Safety/medical concerns(decreased strength/activity tolerance)         Wheelchair     Assist Will patient use wheelchair at discharge?: Yes Type of Wheelchair:  Manual    Wheelchair assist level: Supervision/Verbal cueing, Set up assist Max wheelchair distance: 57'    Wheelchair 50 feet with 2 turns activity    Assist        Assist Level: Set up assist, Supervision/Verbal cueing   Wheelchair 150 feet activity     Assist Wheelchair 150 feet activity did not occur: Safety/medical concerns(decreased strength/activity tolerance)       Plan: 1.  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  Cont CIR PT, OT  2.  Antithrombotics: -DVT/anticoagulation:  Pharmaceutical: Heparin due to ESRD on HD             -antiplatelet therapy: Low dose ASA 3. Pain Management: Due to L BKA, phantom pain, residual limb pain, and R MTP amputation residual limb pain, esp after revascularization procedure.  Continue to stress importance of edema mgt/snug ACE wrap/manual desensitization, etc to help decreased stump pain.   Increased Oxycodone to 5 mg q6 hours prn and added a tramadol 50 mg BID for more steady pain control   Stable on 8/16 4. Mood: LCSW to follow for evaluation and support.              -antipsychotic agents: NA 5. Neuropsych: This patient is capable of making decisions on his own behalf at this time,  -follow for cognition  -consider neuro-psych assessment  -?baseline 6. Skin/Wound Care: Monitor right foot wound --continue santyl with damp to dry dressing on. 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   Cont dressing to left BKA, will consider shrinker this coming week 7. Fluids/Electrolytes/Nutrition: Strict I/O's although anuric--1200 cc FR/day. Labs with HD- M/W/F-  8. ESRD: Cont HD MWF at end of the day to help with tolerance of therapy. Will not change to T/H/S unless pt cannot tolerate current schedule.  Managed per nephrology   9. Chronic hypotension with  previous hx of HTN: Continue midodrine on HD days. 10 mg dose.  Inconsistent control which is likely related to HD 10. PAD/right foot ulcer s/p graft: WBAT with darco shoe-   8/12- according to Podiatry, can WBAT in surgical shoe 11. Anemia of chronic disease: Continue to monitor--ESA/Fe per nephrology -   Hb 8.7 on 8/12  Cont to monitor  -follow up labs with HD 12. Hypoalbuminemia: Continue supplement to promote healing.  43. Dispo- will discharge to home, with family.    d/c date set for 8/20  14. Drug induced constipation  Improving overall-last recorded bowel movement 8/14, continent, will increase Colace to 200 mg twice daily  LOS: 10 days A FACE TO FACE EVALUATION WAS PERFORMED  Charlett Blake 03/27/2019, 9:33 AM

## 2019-03-27 NOTE — Progress Notes (Signed)
Physical Therapy Session Note  Patient Details  Name: Timothy Faulkner MRN: [id number] Date of Birth: 31-May-1946  Today's Date: 03/27/2019 PT Individual Time: 0900-0957 PT Individual Time Calculation (min): 57 min   Short Term Goals: Week 2:  PT Short Term Goal 1 (Week 2): = LTGs due to LOS  Skilled Therapeutic Interventions/Progress Updates:    Pt supine in bed upon PT arrival, agreeable to therapy tx and reports pain 8.5/10 in L distal lower limb, RN present to administer pain medication. Pt transferred to sitting EOB with supervision, therapist donned R shoe total assist. Pt performed squat pivot to w/c with min assist, cues for techniques. Pt propelled w/c to the gym x 150 ft using B UEs with increased time to complete secondary to fatigue and decreased endurance. Pt performed squat pivot transfer to the therapy mat with min assist and cues for techniques. Educated pt on desensitization techniques this session, also educated pt on skin care/edema management for wound heeling and eventual transition to prosthetic. Pt seated edge of mat performed LE strengthening exercises, 2 x 10 of each with cues for techniques: LAQ, hip abduction with green theraband, hip adductor ball squeezes and glute sets. Pt performed UE strengthening exercises while seated edge of mat, 2 x 10 each with cues for techniques: chest press 3# dowel, shoulder press 3# dowel, bicep curls 3# dowel. Pt performed sit<>stand this session from mat with RW and min assist, cues for techniques. Pt transferred to w/c with CGA, transported back to room. Pt requesting to get back in bed, squat pivot to bed with min assist and sit>supine with supervision. Pt left with needs in reach and bed alarm set.   Therapy Documentation Precautions:  Precautions Precautions: Fall Restrictions Weight Bearing Restrictions: Yes RLE Weight Bearing: Weight bearing as tolerated LLE Weight Bearing: Non weight bearing Other Position/Activity Restrictions:  NWB LLE    Therapy/Group: Individual Therapy  Netta Corrigan, PT, DPT 03/27/2019, 9:23 AM

## 2019-03-27 NOTE — Progress Notes (Signed)
[facility] Progress Note   Subjective:  Seen in room, ate good breakfast.  No c/o this AM.  For HD tomorrow.  Objective Vitals:   03/26/19 0557 03/26/19 1508 03/26/19 2112 03/27/19 0348  BP: 106/67 109/62 (!) 100/55 93/67  Pulse: 80 93 93 82  Resp: 16 17 16 16   Temp: 97.6 F (36.4 C) 98 F (36.7 C) 98.2 F (36.8 C) 98.2 F (36.8 C)  TempSrc:   Oral Oral  SpO2: 93% 92% 99% 93%  Weight: 74.1 kg   75.1 kg  Height:       Physical Exam General: well developed, alert and in NAD Heart: RRR, no murmurs, rubs or gallops Lungs: CTA bilaterally without wheezing, rhonchi or rales Abdomen: Soft, non-tender, non-distended +BS Extremities: No edema. LBKA stump and R foot wrapped. Dialysis Access: Right IJ TDC,no erythema/drainage  Additional Objective Labs: Basic Metabolic Panel: Recent Labs  Lab 03/22/19 1116 03/23/19 1257 03/25/19 1230  NA 134* 133* 136  K 3.8 3.8 3.6  CL 96* 94* 96*  CO2 24 26 25   GLUCOSE 82 88 81  BUN 24* 37* 34*  CREATININE 5.23* 6.85* 6.85*  CALCIUM 9.0 9.0 9.3  PHOS 2.5 3.1 3.2   Liver Function Tests: Recent Labs  Lab 03/22/19 1116 03/23/19 1257 03/25/19 1230  ALBUMIN 2.6* 2.6* 2.8*   CBC: Recent Labs  Lab 03/21/19 1545 03/22/19 1116 03/23/19 1256 03/25/19 1230  WBC 8.2 8.2 6.8 6.9  HGB 8.9* 8.5* 8.7* 8.8*  HCT 30.2* 29.2* 29.4* 30.1*  MCV 101.7* 101.7* 101.0* 103.1*  PLT 365 299 287 279   Blood Culture    Component Value Date/Time   SDES BLOOD RIGHT HAND 03/12/2019 1450   SPECREQUEST  03/12/2019 1450    BOTTLES DRAWN AEROBIC AND ANAEROBIC Blood Culture results may not be optimal due to an inadequate volume of blood received in culture bottles   CULT  03/12/2019 1450    NO GROWTH 5 DAYS Performed at [facility], [place] [address] [place], [place] [address]    REPTSTATUS 03/17/2019 FINAL 03/12/2019 1450    Cardiac Enzymes: No results for input(s): CKTOTAL, CKMB, CKMBINDEX, TROPONINI in the last 168  hours. CBG: No results for input(s): GLUCAP in the last 168 hours. Iron Studies:  No results for input(s): IRON, TIBC, TRANSFERRIN, FERRITIN in the last 72 hours. @lablastinr3 @ Studies/Results: No results found. Medications: . ferric gluconate (FERRLECIT/NULECIT) IV Stopped (03/25/19 1659)   . aspirin EC  81 mg Oral Daily  . calcitRIOL  0.5 mcg Oral Q M,W,F-HD  . Chlorhexidine Gluconate Cloth  6 each Topical Q0600  . Chlorhexidine Gluconate Cloth  6 each Topical Q0600  . cinacalcet  30 mg Oral QODAY  . collagenase   Topical Daily  . darbepoetin (ARANESP) injection - DIALYSIS  60 mcg Intravenous Q Wed-HD  . docusate sodium  100 mg Oral BID  . feeding supplement (NEPRO CARB STEADY)  237 mL Oral BID BM  . feeding supplement (PRO-STAT SUGAR FREE 64)  30 mL Oral BID  . heparin  5,000 Units Subcutaneous Q8H  . midodrine  10 mg Oral Q M,W,F-HD  . multivitamin  1 tablet Oral QHS  . rosuvastatin  10 mg Oral q1800  . traMADol  50 mg Oral Q12H    Dialysis Orders: 4h 400/800 78kg 3K/2.5Ca bat Hep 5000 +2000 midrun R IJ TDC Calcitriol 0.5  no Mircera or Fe Recent labs: iPTH 248 Ca/P ok hgb 11.8 31% sat  Assessment/Plan: 1. PAD with critical  limb ischemia - prior revascularization in July; seen by [facility] - no further vascular intervention available to improve circulation.SPBKA8/11/2018. On CIR now. 2. Enterobacter wound culture 7/28 left foot - completed course of Zosyn and Vanc - afebrile, s/p L BKA 3. ESRD- MWFHD. HD - next Monday. 4. Hypertension/volume- using midodrine for BP support. No edema or SOB.Will have lower HD at discharge s/p BKA.  5. Anemia-hgb8.8- down post op, started aranesp 8/10. Iron sat 23% - getting  0.5g IV iron over 4 tx starting 8/12.  6. Metabolic bone disease-Calcium 9.0, corrected 10.1. Lowered dose of calcitriol.  Continue sensipar. Auryxia binder on hold d/t low phos -monitor .  7. NutritionAlbumin 2.8. Continue supplement. Renal diet with  fluid restrictions recommended. 8. DM- Per primary 9. Hx NICM s/p ICD 10. Dispo: EDD 8/20  Jannifer Hick MD [facility] Pager [phone]

## 2019-03-28 ENCOUNTER — Inpatient Hospital Stay (HOSPITAL_COMMUNITY): Payer: Medicare Other

## 2019-03-28 ENCOUNTER — Encounter (HOSPITAL_COMMUNITY): Payer: Medicare Other | Admitting: Psychology

## 2019-03-28 ENCOUNTER — Inpatient Hospital Stay (HOSPITAL_COMMUNITY): Payer: Medicare Other | Admitting: Occupational Therapy

## 2019-03-28 DIAGNOSIS — G8918 Other acute postprocedural pain: Secondary | ICD-10-CM

## 2019-03-28 DIAGNOSIS — G546 Phantom limb syndrome with pain: Secondary | ICD-10-CM

## 2019-03-28 LAB — CBC
HCT: 36.9 % — ABNORMAL LOW (ref 39.0–52.0)
Hemoglobin: 11 g/dL — ABNORMAL LOW (ref 13.0–17.0)
MCH: 30.1 pg (ref 26.0–34.0)
MCHC: 29.8 g/dL — ABNORMAL LOW (ref 30.0–36.0)
MCV: 101.1 fL — ABNORMAL HIGH (ref 80.0–100.0)
Platelets: 241 10*3/uL (ref 150–400)
RBC: 3.65 MIL/uL — ABNORMAL LOW (ref 4.22–5.81)
RDW: 18.9 % — ABNORMAL HIGH (ref 11.5–15.5)
WBC: 4.5 10*3/uL (ref 4.0–10.5)
nRBC: 0 % (ref 0.0–0.2)

## 2019-03-28 LAB — RENAL FUNCTION PANEL
Albumin: 2.8 g/dL — ABNORMAL LOW (ref 3.5–5.0)
Anion gap: 16 — ABNORMAL HIGH (ref 5–15)
BUN: 35 mg/dL — ABNORMAL HIGH (ref 8–23)
CO2: 24 mmol/L (ref 22–32)
Calcium: 9 mg/dL (ref 8.9–10.3)
Chloride: 95 mmol/L — ABNORMAL LOW (ref 98–111)
Creatinine, Ser: 8.22 mg/dL — ABNORMAL HIGH (ref 0.61–1.24)
GFR calc Af Amer: 7 mL/min — ABNORMAL LOW (ref >60)
GFR calc non Af Amer: 6 mL/min — ABNORMAL LOW (ref >60)
Glucose, Bld: 87 mg/dL (ref 70–99)
Phosphorus: 4.4 mg/dL (ref 2.5–4.6)
Potassium: 3.6 mmol/L (ref 3.5–5.1)
Sodium: 135 mmol/L (ref 135–145)

## 2019-03-28 MED ORDER — HEPARIN SODIUM (PORCINE) 1000 UNIT/ML IJ SOLN
4.2000 mL | Freq: Once | INTRAMUSCULAR | Status: AC
  Administered 2019-03-28: 4200 [IU] via INTRAVENOUS

## 2019-03-28 MED ORDER — HEPARIN SODIUM (PORCINE) 1000 UNIT/ML IJ SOLN
INTRAMUSCULAR | Status: AC
  Filled 2019-03-28: qty 4

## 2019-03-28 MED ORDER — CALCITRIOL 0.5 MCG PO CAPS
ORAL_CAPSULE | ORAL | Status: AC
  Filled 2019-03-28: qty 1

## 2019-03-28 NOTE — Progress Notes (Addendum)
Subjective:  No cos ,  For HD today   Objective Vital signs in last 24 hours: Vitals:   03/27/19 1519 03/27/19 2007 03/28/19 0532 03/28/19 0537  BP: 91/61 97/64 133/71   Pulse: 93 91 85   Resp: 18 14 16    Temp: 98.1 F (36.7 C) 98.3 F (36.8 C) 97.7 F (36.5 C)   TempSrc: Oral Oral Oral   SpO2: 90% 97% 98%   Weight:    76.4 kg  Height:       Weight change: 1.3 kg  Physical Exam: General:alert , WD WN Male NAD    Heart: RRR, no murmurs, rubs or gallops Lungs: CTA bilaterally without wheezing, rhonchi or rales Abdomen: Soft, non-tender, non-distended +BS Extremities: No edema. LBKA stump and R foot wrapped. Dry clean  Dressings  Dialysis Access: Right IJ TDC,no erythema/drainage   Op Dialysis Orders: 4h 400/800 78kg 3K/2.5Ca bat Hep 5000 +2000 midrun R IJ TDC Calcitriol 0.5  no Mircera or Fe Recent labs: iPTH 248 Ca/P ok hgb 11.8 31% sat  Problem/Plan: 1. PAD with critical limb ischemia - prior revascularization in July; seen by [facility] - no further vascular intervention available to improve circulation.SPBKA8/11/2018. On CIR now. 2. Enterobacter wound culture 7/28 left foot -SP L BKA has  completed course of Zosyn and Vanc  3. ESRD- MWFHD. HD -today 4. Hypertension/volume- using midodrine for BP support. No edema or SOB.Will have lower HD at discharge s/p BKA.  5. Anemia-hgb8.8-on 8/14  down post op, startedaranesp8/10.Iron sat 23% - getting  0.5g IV iron over 4 tx starting 8/12. 6. Metabolic bone disease-Calcium corrected 10.2. Lowered dose of calcitriol. Continue sensipar. Auryxia binder on hold d/t low phos -3.2 monitor .  7. NutritionAlbumin 2.8. Continue supplement. Renal diet with fluid restrictions recommended. 8. DM- Per primary 9. Hx NICM s/p ICD 10. Dispo: EDD 8/20  [name], PA-C [facility] [phone] 03/28/2019,1:05 PM  LOS: 11 days   Pt seen, examined and agree w A/P as above.  Timothy Splinter   MD 03/28/2019, 2:08 PM    Labs: Basic Metabolic Panel: Recent Labs  Lab 03/22/19 1116 03/23/19 1257 03/25/19 1230  NA 134* 133* 136  K 3.8 3.8 3.6  CL 96* 94* 96*  CO2 24 26 25   GLUCOSE 82 88 81  BUN 24* 37* 34*  CREATININE 5.23* 6.85* 6.85*  CALCIUM 9.0 9.0 9.3  PHOS 2.5 3.1 3.2   Liver Function Tests: Recent Labs  Lab 03/22/19 1116 03/23/19 1257 03/25/19 1230  ALBUMIN 2.6* 2.6* 2.8*   No results for input(s): LIPASE, AMYLASE in the last 168 hours. No results for input(s): AMMONIA in the last 168 hours. CBC: Recent Labs  Lab 03/21/19 1545 03/22/19 1116 03/23/19 1256 03/25/19 1230  WBC 8.2 8.2 6.8 6.9  HGB 8.9* 8.5* 8.7* 8.8*  HCT 30.2* 29.2* 29.4* 30.1*  MCV 101.7* 101.7* 101.0* 103.1*  PLT 365 299 287 279   Cardiac Enzymes: No results for input(s): CKTOTAL, CKMB, CKMBINDEX, TROPONINI in the last 168 hours. CBG: No results for input(s): GLUCAP in the last 168 hours.  Studies/Results: No results found. Medications: . ferric gluconate (FERRLECIT/NULECIT) IV Stopped (03/25/19 1659)   . aspirin EC  81 mg Oral Daily  . calcitRIOL  0.5 mcg Oral Q M,W,F-HD  . Chlorhexidine Gluconate Cloth  6 each Topical Q0600  . Chlorhexidine Gluconate Cloth  6 each Topical Q0600  . cinacalcet  30 mg Oral QODAY  . collagenase   Topical Daily  . darbepoetin (  ARANESP) injection - DIALYSIS  60 mcg Intravenous Q Wed-HD  . docusate sodium  200 mg Oral BID  . feeding supplement (NEPRO CARB STEADY)  237 mL Oral BID BM  . feeding supplement (PRO-STAT SUGAR FREE 64)  30 mL Oral BID  . heparin  5,000 Units Subcutaneous Q8H  . midodrine  10 mg Oral Q M,W,F-HD  . multivitamin  1 tablet Oral QHS  . rosuvastatin  10 mg Oral q1800  . traMADol  50 mg Oral Q12H

## 2019-03-28 NOTE — Progress Notes (Signed)
Occupational Therapy Session Note  Patient Details  Name: Timothy Faulkner MRN: [id number] Date of Birth: [birth year]  Today's Date: 03/28/2019 OT Individual Time: 0705-0800 OT Individual Time Calculation (min): 55 min    Short Term Goals: Week 2:  OT Short Term Goal 1 (Week 2): Pt will thread BLE into pants wiht S and AE PRN OT Short Term Goal 2 (Week 2): Pt will maintain standing balance with min A for clothing management OT Short Term Goal 3 (Week 2): Pt will perform toilet transfer with min A  Skilled Therapeutic Interventions/Progress Updates:    Upon entering the room, pt sleeping soundly this session and needing increased encouragement for participation this session. Pt declined bathing tasks but seated on EOB to wash face with set up A and change UB clothing items. OT set up wheelchair next to bed for min A squat pivot transfer. RN arrived with morning medications and then pt propelled wheelchair 100' to ADL apartment with supervision and increased time. Pt needing min A to get wheelchair over thresholds. Pt propelled wheelchair onto carpeted surface and able to turn around in tight space with min cuing and increased time. Pt returning to room at end of session, chair alarm donned and activated. Breakfast tray set up and call bell within reach.   Therapy Documentation Precautions:  Precautions Precautions: Fall Restrictions Weight Bearing Restrictions: Yes RLE Weight Bearing: Weight bearing as tolerated LLE Weight Bearing: Non weight bearing Other Position/Activity Restrictions: NWB LLE    Pain: Pain Assessment Pain Scale: 0-10 Pain Score: 7  Pain Intervention(s): Medication (See eMAR)   Therapy/Group: Individual Therapy  Gypsy Decant 03/28/2019, 9:41 AM

## 2019-03-28 NOTE — Progress Notes (Signed)
Patient sent to dialysis, no complaints of pain at this time.

## 2019-03-28 NOTE — Progress Notes (Signed)
Occupational Therapy Session Note  Patient Details  Name: Timothy Faulkner MRN: [id number] Date of Birth: 1946/06/19  Today's Date: 03/28/2019 OT Individual Time: 7209-4709 OT Individual Time Calculation (min): 70 min    Short Term Goals: Week 2:  OT Short Term Goal 1 (Week 2): Pt will thread BLE into pants wiht S and AE PRN OT Short Term Goal 2 (Week 2): Pt will maintain standing balance with min A for clothing management OT Short Term Goal 3 (Week 2): Pt will perform toilet transfer with min A  Skilled Therapeutic Interventions/Progress Updates:    OT intervention with focus on BUE therex, w/c mobility, sit<>stand, functional transfers, standing balance, and activity tolerance to increase independence with bADLs.  Pt completed bathing/dressing during earlier OT session.  Pt propelled w/c in hallway with focus on BUE strengthening and increased awareness of envirionment. Pt performed sit<>stand X 4 with min A and min A for standing balance with rest breaks between.  Pt practiced squat pivot transfers X 3 with min/mod A and max multimodal cues for sequencing and safety. Pt engaged in BUE therex with 5# bar-chest presses and biceps curls 3X15. Pt returned to room and performed squat pivot transfer with min A.  Pt remained in bed with all needs within reach and bed alarm activated.   Therapy Documentation Precautions:  Precautions Precautions: Fall Restrictions Weight Bearing Restrictions: Yes RLE Weight Bearing: Weight bearing as tolerated LLE Weight Bearing: Non weight bearing Other Position/Activity Restrictions: NWB LLE   Pain: Pt c/o increased LLE pain during session; rewrapped ace wraps  Therapy/Group: Individual Therapy  Leroy Libman 03/28/2019, 12:19 PM

## 2019-03-28 NOTE — Consult Note (Signed)
Neuropsychological Consultation   Patient:   Timothy Faulkner   DOB:   [birth year]  MR Number:  [id number]  Location:  [facility] [address] [facility] [place] [id number] [place] [address] Dept: [place]: [phone]           Date of Service:   03/28/2019  Start Time:   7:45 AM End Time:   8:45 AM  Provider/Observer:  Ilean Skill, Psy.D.       Clinical Neuropsychologist       Billing Code/Service: [phone]  Chief Complaint:    Zymeir Salminen is a 73 year old male with history of HTN, NICM, ESRD, PVD with multiple bilateral foot wounds with recent revascularization and atherectomy with left 3rd and 4th amputation with poor healing and was admitted on 03/12/19 with progressive ischemic charges.  BKA recommended and patient underwent L-BKA on 8/4 by Dr. Oneida Alar.  Postop with acute on chronic hypotension as well as ABLA.  Patient was good pain levels until touched.  Patient does describe phantom pain and other phantom sensations but these are improving with time and doing well in PT/OT therapies.  Mood is reported to be good, without significant depressive or anxiety symptoms after initial difficulties with stress due to medical situation.    Reason for Service:  Patient referred for neuropsychological consultation due to coping and adjustment issues.  Below is the HPI for the current admission.  HPI:  Timothy Faulkner is a 73 year old male with history of HTN, NICM s/p AICD, ESRD-HD MWF, PVD with multiple bilateral foot wounds s/p recent revascularization and atherectomy with left 3rd and 4th toe amputations with poor healing and was admitted on 03/12/19 with progressive ischemic changes. He was started on IV antibiotics in hopes of limb salvage--wound cultures positive for enterobacter. 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 Oneida Alar. Postop with acute on chronic  hypotension as well as ABLA.  Therapy evaluations completed revealing functional decline. CIR recommended for follow up therapy.    Said pain was well controlled at rest, however, as soon as lightly touched L BKA wrapping, screamed out loud. Then admitted, probably needed pain meds to tolerate dressing change. Refused to turn over or let me undress L BKA at all. Let me see wound on R foot.  Current Status:  Patient reports improving coping and adjustment with BKA.  Patient reports that he is feeling better overall now and ready to continue to work hard in therapies.    Behavioral Observation: ORYON GARY  presents as a 73 y.o.-year-old Right African American Male who appeared his stated age. his dress was Appropriate and he was Well Groomed and his manners were Appropriate to the situation.  his participation was indicative of Appropriate and Attentive behaviors.  There were any physical disabilities noted.  he displayed an appropriate level of cooperation and motivation.     Interactions:    Active Appropriate and Attentive  Attention:   within normal limits and attention span and concentration were age appropriate  Memory:   within normal limits; recent and remote memory intact  Visuo-spatial:  not examined  Speech (Volume):  low  Speech:   normal; normal  Thought Process:  Coherent and Relevant  Though Content:  WNL; not suicidal and not homicidal  Orientation:   person, place, time/date and situation  Judgment:   Good  Planning:   Good  Affect:    Appropriate  Mood:    Dysphoric  Insight:   Good  Intelligence:   normal  Medical History:   Past Medical History:  Diagnosis Date  . Anemia   . Arthritis    Gout  . Automatic implantable cardioverter-defibrillator in situ    [employer]  . CHF   . ESRD on dialysis [facility])    [name] 03/11/2013 (03/11/2013) dialysis M/W/F  . Gangrene ([place])    left foot  . GERD (gastroesophageal reflux disease)   . Gout     "once a year"  . Heart murmur   . HYPERCHOLESTEROLEMIA, MIXED   . HYPERTENSION   . Macular degeneration   . Osteomyelitis ([place])    right foot  . 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 Pacemaker   . Peripheral arterial disease (HCC)    left fifth toe ulcer, healing  . Pneumonia   . Shortness of breath   . 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 Wears dentures   . Wears glasses    Psychiatric History:  No prior psych history  Family Med/Psych History:  Family History  Problem Relation Age of Onset  . Heart disease Father   . CAD Father     Impression/DX:  Timothy Faulkner is a 73 year old male with history of HTN, NICM, ESRD, PVD with multiple bilateral foot wounds with recent revascularization and atherectomy with left 3rd and 4th amputation with poor healing and was admitted on 03/12/19 with progressive ischemic charges.  BKA recommended and patient underwent L-BKA on 8/4 by Dr. Oneida Alar.  Postop with acute on chronic hypotension as well as ABLA.  Patient was good pain levels until touched.  Patient does describe phantom pain and other phantom sensations but these are improving with time and doing well in PT/OT therapies.  Mood is reported to be good, without significant depressive or anxiety symptoms after initial difficulties with stress due to medical situation.    Patient reports improving coping and adjustment with BKA.  Patient reports that he is feeling better overall now and ready to continue to work hard in therapies.   Disposition/Plan:  Worked on coping and adjustment issues.  Patient to be discharged on 03/31/19.    Diagnosis:    Unilateral complete BKA, left, initial encounter [facility]) - Plan: Ambulatory referral to Physical Medicine Rehab         Electronically Signed   _______________________ Ilean Skill, Psy.D.

## 2019-03-28 NOTE — Progress Notes (Addendum)
Physical Therapy Session Note  Patient Details  Name: Timothy Faulkner MRN: [id number] Date of Birth: 07/17/1946  Today's Date: 03/28/2019 PT Individual Time: 0920-1005 PT Individual Time Calculation (min): 45 min   Short Term Goals:  Week 2:  PT Short Term Goal 1 (Week 2): = LTGs due to LOS  Skilled Therapeutic Interventions/Progress Updates:  Pt's insurance will not allow custom w/c.  He will need 18 x 16 w/c with L amputee pad, preferable adjustable height to accommodate his L knee flexion contracture while preventing wt bearing distal end of residual limb.  Pt sitting up in w/c; ACe wrap intact LLE.  W/c propulsion on level tile using bil UEs with extra time inlcuding turns, supervision, x 50' x 2.  Attempted simulated car transfer but pt unable to elevate hips at all.  He stated that he is weakest on Monday since he has not had HD since Friday. Using slide board for set up, pt performed with close supervision, slightly downhill to 19" ht as per pt's car, [employer]. Pt used L hand on frame of car over window; he was unable to say whether or not that was a habit, PTA.  PT issued amputee BKA hand-out for ex.  Pt stated that at home he has strong magnifier glasses that he will be able to use to read it. In sitting, pt performed 10 x 1 each bil glut sets, L short arc quad knee extension.  At end of session, pt restingin w/c with needs at hand and seat belt alarm set.     Therapy Documentation Precautions:  Precautions Precautions: Fall Restrictions Weight Bearing Restrictions: Yes RLE Weight Bearing: Weight bearing as tolerated LLE Weight Bearing: Non weight bearing Other Position/Activity Restrictions: NWB LLE    Pain: Pain Assessment " a little bit" L residual limb Pain Intervention(s): Medication (See eMAR)       Therapy/Group: Individual Therapy  Nhu Glasby 03/28/2019, 10:12 AM

## 2019-03-28 NOTE — Progress Notes (Signed)
Timothy Faulkner PHYSICAL MEDICINE & REHABILITATION PROGRESS NOTE   Subjective/Complaints:  Pt reports pain is getting better daily- not bad right now.  LBM was last night- denies constipation. Still getting L BKA wrapped with ACE wrap. Plans for HD this afternoon  ROS: Patient denies, nausea, vomiting, diarrhea, cough, , joint or back pain, headache, or mood change.    Objective:   No results found. Recent Labs    03/25/19 1230  WBC 6.9  HGB 8.8*  HCT 30.1*  PLT 279   Recent Labs    03/25/19 1230  NA 136  K 3.6  CL 96*  CO2 25  GLUCOSE 81  BUN 34*  CREATININE 6.85*  CALCIUM 9.3    Intake/Output Summary (Last 24 hours) at 03/28/2019 1047 Last data filed at 03/28/2019 6387 Gross per 24 hour  Intake 720 ml  Output -  Net 720 ml     Physical Exam: Vital Signs Blood pressure 133/71, pulse 85, temperature 97.7 F (36.5 C), temperature source Oral, resp. rate 16, height 5\' 8"  (1.727 m), weight 76.4 kg, SpO2 98 %. Constitutional: No distress . Vital signs reviewed and labs reviewed; sitting up in manual w/c, wheeling self back to room with PT at side, bright, lively affect, NAD. HEENT: EOMI, oral membranes moist Neck: supple Cardiovascular: RRR without murmur. No JVD    Respiratory: CTA Bilaterally without wheezes or rales. Normal effort    GI: BS +, non-tender, non-distended  Skin: Warm and dry.  L BKA wrapped in ACE wrap- was just wrapped, by nursing. Psych: Normal mood.  Normal behavior. Musc: left BK tender but better Neuro: Alert Motor: Strength 4+/5 RUE and RLE LLE >/3/5, limited by pain Skin: Dressing to b/l LE C/D/I Right Foot dry, 5th toe amp site clean,intact. skin otherwise intact RLE, left BK incision CDI with staples in place. Minimal drainage Psychiatric: pleasant.   Assessment/Plan: 1. Functional deficits secondary to L BKA and R 5th MTP amputation with resulting wound which require 3+ hours per day of interdisciplinary therapy in a comprehensive  inpatient rehab setting.  Physiatrist is providing close team supervision and 24 hour management of active medical problems listed below.  Physiatrist and rehab team continue to assess barriers to discharge/monitor patient progress toward functional and medical goals  Care Tool:  Bathing  Bathing activity did not occur: Refused Body parts bathed by patient: Right arm, Left arm, Abdomen, Chest, Front perineal area, Left upper leg, Right upper leg, Buttocks, Face   Body parts bathed by helper: Buttocks Body parts n/a: Right lower leg, Left lower leg   Bathing assist Assist Level: Minimal Assistance - Patient > 75%     Upper Body Dressing/Undressing Upper body dressing   What is the patient wearing?: Pull over shirt    Upper body assist Assist Level: Set up assist    Lower Body Dressing/Undressing Lower body dressing      What is the patient wearing?: Pants     Lower body assist Assist for lower body dressing: Minimal Assistance - Patient > 75%     Toileting Toileting Toileting Activity did not occur (Clothing management and hygiene only): N/A (no void or bm)  Toileting assist Assist for toileting: Moderate Assistance - Patient 50 - 74%     Transfers Chair/bed transfer  Transfers assist     Chair/bed transfer assist level: Minimal Assistance - Patient > 75%     Locomotion Ambulation   Ambulation assist   Ambulation activity did not occur: Safety/medical concerns(decreased strength/activity  tolerance)          Walk 10 feet activity   Assist  Walk 10 feet activity did not occur: Safety/medical concerns(decreased strength/activity tolerance)        Walk 50 feet activity   Assist Walk 50 feet with 2 turns activity did not occur: Safety/medical concerns(decreased strength/activity tolerance)         Walk 150 feet activity   Assist Walk 150 feet activity did not occur: Safety/medical concerns(decreased strength/activity tolerance)          Walk 10 feet on uneven surface  activity   Assist Walk 10 feet on uneven surfaces activity did not occur: Safety/medical concerns(decreased strength/activity tolerance)         Wheelchair     Assist Will patient use wheelchair at discharge?: Yes Type of Wheelchair: Manual    Wheelchair assist level: Supervision/Verbal cueing Max wheelchair distance: 50    Wheelchair 50 feet with 2 turns activity    Assist        Assist Level: Supervision/Verbal cueing   Wheelchair 150 feet activity     Assist Wheelchair 150 feet activity did not occur: Safety/medical concerns(decreased strength/activity tolerance)   Assist Level: Set up assist, Supervision/Verbal cueing   Plan: 1.  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  Cont CIR PT, OT  2.  Antithrombotics: -DVT/anticoagulation:  Pharmaceutical: Heparin due to ESRD on HD             -antiplatelet therapy: Low dose ASA 3. Pain Management: Due to L BKA, phantom pain, residual limb pain, and R MTP amputation residual limb pain, esp after revascularization procedure.  Continue to stress importance of edema mgt/snug ACE wrap/manual desensitization, etc to help decreased stump pain.   Increased Oxycodone to 5 mg q6 hours prn and added a tramadol 50 mg BID for more steady pain control   Stable on 8/16  8/17- pain better today than Sunday/over weekend 4. Mood: LCSW to follow for evaluation and support.              -antipsychotic agents: NA 5. Neuropsych: This patient is capable of making decisions on his own behalf at this time,  8/17- much more with it, appropriate, Ox3 6. Skin/Wound Care: Monitor right foot wound --continue santyl with damp to dry dressing on. 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   Cont dressing to left BKA, will consider  shrinker this coming week  8/17- ordered rentention sock last week- hasn't received 7. Fluids/Electrolytes/Nutrition: Strict I/O's although anuric--1200 cc FR/day. Labs with HD- M/W/F-  8. ESRD: Cont HD MWF at end of the day to help with tolerance of therapy. Will not change to T/H/S unless pt cannot tolerate current schedule.  Managed per nephrology   9. Chronic hypotension with previous hx of HTN: Continue midodrine on HD days. 10 mg dose.  Inconsistent control which is likely related to HD 10. PAD/right foot ulcer s/p graft: WBAT with darco shoe-   8/12- according to Podiatry, can WBAT in surgical shoe 11. Anemia of chronic disease: Continue to monitor--ESA/Fe per nephrology -   Hb 8.7 on 8/12  Cont to monitor  -follow up labs with HD 12. Hypoalbuminemia: Continue supplement to promote healing.  52. Dispo- will discharge to home, with family.    d/c date set for  8/20  14. Drug induced constipation  Improving overall-last recorded bowel movement 8/14, continent, will increase Colace to 200 mg twice daily  8/17- LBM last night- doing better  LOS: 11 days A FACE TO FACE EVALUATION WAS PERFORMED  Timothy Faulkner 03/28/2019, 10:47 AM

## 2019-03-29 ENCOUNTER — Inpatient Hospital Stay (HOSPITAL_COMMUNITY): Payer: Medicare Other

## 2019-03-29 ENCOUNTER — Inpatient Hospital Stay (HOSPITAL_COMMUNITY): Payer: Medicare Other | Admitting: Occupational Therapy

## 2019-03-29 NOTE — Progress Notes (Signed)
Social Work Patient ID: Timothy Faulkner, male   DOB: 1946-08-03, 73 y.o.   MRN: [id number] Have scheduled daughter for family education tomorrow @ 10:00. Need to do education in am due to HD in the afternoon. Work toward discharge for Thursday.

## 2019-03-29 NOTE — Progress Notes (Signed)
Occupational Therapy Session Note  Patient Details  Name: Timothy Faulkner MRN: [id number] Date of Birth: [birth year]  Today's Date: 03/29/2019 OT Individual Time: 0141-0301 OT Individual Time Calculation (min): 70 min    Short Term Goals: Week 2:  OT Short Term Goal 1 (Week 2): Pt will thread BLE into pants wiht S and AE PRN OT Short Term Goal 2 (Week 2): Pt will maintain standing balance with min A for clothing management OT Short Term Goal 3 (Week 2): Pt will perform toilet transfer with min A  Skilled Therapeutic Interventions/Progress Updates:    Pt resting in w/c upon arrival and agreeable to therapy.  Pt already bathed/dressed from earlier OT session.  Pt oriented to place, date, and situation.  OT intervention with focus on sliding board transfers, BUE therex, unsupported sitting balance, sit<>stand, standing balance, discharge planning. Pt performed sliding board transfers X 6 with CGA/supervision after board placed.  Recommended drop arm BSC for home use.  Pt engaged in BUE therex seated on mat (1kg ball for trunk rotation and chest presses; 3# bar for batting beach ball). Pt stood at counter X 3 with CGA and max verbal cues for sequencing and safety.  Pt requires multiple rest breaks during tasks.  Pt stated he didn't rest well previous night.  Pt returned to bed with all needs within reach and bed alarm activated.   Therapy Documentation Precautions:  Precautions Precautions: Fall Restrictions Weight Bearing Restrictions: Yes RLE Weight Bearing: Weight bearing as tolerated LLE Weight Bearing: Non weight bearing Other Position/Activity Restrictions: NWB LLE   Pain: Pain Assessment Pain Scale: 0-10 Pain Score: 5  Pain Type: Surgical pain Pain Location: Leg Pain Descriptors / Indicators: Aching Pain Frequency: Intermittent Pain Onset: On-going Pain Intervention(s): repositioned    Therapy/Group: Individual Therapy  Leroy Libman 03/29/2019, 10:59 AM

## 2019-03-29 NOTE — Discharge Instructions (Signed)
Inpatient Rehab Discharge Instructions  Timothy Faulkner Discharge date and time: 03/31/19   Activities/Precautions/ Functional Status: Activity: no lifting, driving, or strenuous exercise till cleared by MD Diet: renal diet--1200 cc fluids/day Wound Care: Santyl applied to right foot wound edge nickel thick.  Cover with wet gauze followed by dry gauze change daily   Functional status:  ___ No restrictions     ___ Walk up steps independently _X__ 24/7 supervision/assistance   ___ Walk up steps with assistance ___ Intermittent supervision/assistance  ___ Bathe/dress independently ___ Walk with walker     ___ Bathe/dress with assistance ___ Walk Independently    ___ Shower independently ___ Walk with assistance    ___ Shower with assistance _X__ No alcohol     ___ Return to work/school ________  Special Instructions: Home health nurse for dressing changes Santyl applied to right foot wound edge nickel thick.  Cover with wet gauze followed by dry gauze change daily  Continue dialysis as directed   COMMUNITY REFERRALS UPON DISCHARGE:    Home Health:   PT, OT, RN  [place] [phone]   Date of last service:03/31/2019  Medical Equipment/Items Ordered:WHEELCHAIR, [place]  Agency/Supplier: [employer]  [phone]   GENERAL COMMUNITY RESOURCES FOR PATIENT/FAMILY: Support Groups:[facility] SUPPORT GROUP THE SECOND Thursday OF EACH MONTH @ 7:00-8:30 PM WHEN RESUMED [place] [phone]  My questions have been answered and I understand these instructions. I will adhere to these goals and the provided educational materials after my discharge from the hospital.  Patient/Caregiver Signature _______________________________ Date __________  Clinician Signature _______________________________________ Date __________  Please bring this form and your medication list with  you to all your follow-up doctor's appointments.

## 2019-03-29 NOTE — Progress Notes (Signed)
Subjective:  No cos , tolerated HD yesterday    Objective Vital signs in last 24 hours: Vitals:   03/28/19 2023 03/28/19 2107 03/29/19 0405 03/29/19 0500  BP: (!) 88/47 (!) 91/53 127/77   Pulse: 86  89   Resp: 17  18   Temp: 97.7 F (36.5 C)  97.8 F (36.6 C)   TempSrc: Oral  Oral   SpO2: 93%  96%   Weight:    78.2 kg  Height:       Weight change: -1.3 kg  Physical Exam: General:alert , Male NAD    Heart: RRR, no murmurs, rubs or gallops Lungs: CTA bilaterally without wheezing, rhonchi or rales Abdomen: Soft, non-tender, non-distended +BS Extremities: No edema. LBKA stump and R foot wrapped. Dry clean  Dressings  Dialysis Access: Right IJ TDC,no erythema/drainage   Op Dialysis Orders: 4h 400/800 78kg 3K/2.5Ca bat Hep 5000 +2000 midrun R IJ TDC Calcitriol 0.5  no Mircera or Fe Recent labs: iPTH 248 Ca/P ok hgb 11.8 31% sat  Problem/Plan: 1. PAD with critical limb ischemia - prior revascularization in July; seen by [facility] - no further vascular intervention available to improve circulation.SPBKA8/11/2018. On CIR now. 2. Enterobacter wound culture 7/28 left foot -SP L BKA has  completed course of Zosyn and Vanc  3. ESRD- MWFHD. HDon schedule  4. Hypertension/volume- using midodrine for BP support. No edema or SOB.Will have lower HD at discharge s/p BKA.  5. Anemia-hgb 9.4>8.8 >11.0 ? YEST.PRE HD ? down post op, startedaranesp8/10.Iron sat 23% - getting 0.5g IV iron over 4 tx starting 8/12.hold esa if still high  6. Metabolic bone disease-Calcium corrected 10.2. Lowered dose of calcitriol. Continue sensipar. Auryxia binder on hold d/t low phos -3.2 monitor .  7. NutritionAlbumin 2.8. Continue supplement. Renal diet with fluid restrictions recommended. 8. DM- Per primary 9. Hx NICM s/p ICD 10. Dispo: EDD 8/20 planned   [name], PA-C [facility] [phone] 03/29/2019,2:16 PM  LOS: 12 days   Labs: Basic Metabolic  Panel: Recent Labs  Lab 03/23/19 1257 03/25/19 1230 03/28/19 1414  NA 133* 136 135  K 3.8 3.6 3.6  CL 94* 96* 95*  CO2 26 25 24   GLUCOSE 88 81 87  BUN 37* 34* 35*  CREATININE 6.85* 6.85* 8.22*  CALCIUM 9.0 9.3 9.0  PHOS 3.1 3.2 4.4   Liver Function Tests: Recent Labs  Lab 03/23/19 1257 03/25/19 1230 03/28/19 1414  ALBUMIN 2.6* 2.8* 2.8*   No results for input(s): LIPASE, AMYLASE in the last 168 hours. No results for input(s): AMMONIA in the last 168 hours. CBC: Recent Labs  Lab 03/23/19 1256 03/25/19 1230 03/28/19 1415  WBC 6.8 6.9 4.5  HGB 8.7* 8.8* 11.0*  HCT 29.4* 30.1* 36.9*  MCV 101.0* 103.1* 101.1*  PLT 287 279 241   Cardiac Enzymes: No results for input(s): CKTOTAL, CKMB, CKMBINDEX, TROPONINI in the last 168 hours. CBG: No results for input(s): GLUCAP in the last 168 hours.  Studies/Results: No results found. Medications: . ferric gluconate (FERRLECIT/NULECIT) IV 125 mg (03/28/19 1620)   . aspirin EC  81 mg Oral Daily  . calcitRIOL  0.5 mcg Oral Q M,W,F-HD  . Chlorhexidine Gluconate Cloth  6 each Topical Q0600  . Chlorhexidine Gluconate Cloth  6 each Topical Q0600  . cinacalcet  30 mg Oral QODAY  . collagenase   Topical Daily  . docusate sodium  200 mg Oral BID  . feeding supplement (NEPRO CARB STEADY)  237 mL Oral BID  BM  . feeding supplement (PRO-STAT SUGAR FREE 64)  30 mL Oral BID  . heparin  5,000 Units Subcutaneous Q8H  . midodrine  10 mg Oral Q M,W,F-HD  . multivitamin  1 tablet Oral QHS  . rosuvastatin  10 mg Oral q1800  . traMADol  50 mg Oral Q12H

## 2019-03-29 NOTE — Progress Notes (Signed)
Occupational Therapy Session Note  Patient Details  Name: Timothy Faulkner MRN: [id number] Date of Birth: November 02, 1945  Today's Date: 03/29/2019 OT Individual Time: 0730-0830 OT Individual Time Calculation (min): 60 min    Short Term Goals: Week 2:  OT Short Term Goal 1 (Week 2): Pt will thread BLE into pants wiht S and AE PRN OT Short Term Goal 2 (Week 2): Pt will maintain standing balance with min A for clothing management OT Short Term Goal 3 (Week 2): Pt will perform toilet transfer with min A  Skilled Therapeutic Interventions/Progress Updates:    Treatment session with focus on self-care retraining and functional transfers.  Pt received supine in bed with rehab MD rounding on pt.  MD reports need to have residual limb rewrapped.  Therapist educated pt on figure 8 wrapping technique, however pt will require assistance with wrapping or shrinker when medically ready.  Pt completed bed mobility with increased time and mod assist and cues for squat pivot transfer due to decreased processing and problem solving.  Engaged in bathing/dressing at sit > stand level at sink with pt initially attempting to wash body while still wearing clothes, requiring cues to undress first.  Pt demonstrating decreased safety awareness with attempting to stand without assist or w/c brakes lost (while therapist was gathering clothing).  Cues for safety and fall risk with new BKA and Rt foot wound.  Min assist sit > stand at sink with UE support, therapist assisted with clothing management due to decreased processing this session.  Pt left upright in w/c with seat belt alarm on and all needs in reach.  Therapy Documentation Precautions:  Precautions Precautions: Fall Restrictions Weight Bearing Restrictions: Yes RLE Weight Bearing: Weight bearing as tolerated LLE Weight Bearing: Non weight bearing Other Position/Activity Restrictions: NWB LLE Pain: Pain Assessment Pain Score: 5    Therapy/Group: Individual  Therapy  Simonne Come 03/29/2019, 12:12 PM

## 2019-03-29 NOTE — Progress Notes (Signed)
[place] PHYSICAL MEDICINE & REHABILITATION PROGRESS NOTE   Subjective/Complaints:  Pt reports every day is better- does note having some sanguinous drainage from 1 spot on L BKA- appears to be dark blood from old blood clot/seroma.  Asking for dressing to be done so can get up for PT/OT.  ROS: Patient denies, nausea, vomiting, diarrhea, cough, , joint or back pain, headache, or mood change.    Objective:   No results found. Recent Labs    03/28/19 1415  WBC 4.5  HGB 11.0*  HCT 36.9*  PLT 241   Recent Labs    03/28/19 1414  NA 135  K 3.6  CL 95*  CO2 24  GLUCOSE 87  BUN 35*  CREATININE 8.22*  CALCIUM 9.0    Intake/Output Summary (Last 24 hours) at 03/29/2019 1411 Last data filed at 03/28/2019 2109 Gross per 24 hour  Intake 230 ml  Output 1600 ml  Net -1370 ml     Physical Exam: Vital Signs Blood pressure 127/77, pulse 89, temperature 97.8 F (36.6 C), temperature source Oral, resp. rate 18, height 5\' 8"  (1.727 m), weight 78.2 kg, SpO2 96 %. Constitutional: No distress . Vital signs reviewed and labs reviewed; sitting up in bed, L BKA propped in front of him- just finished breakfast- 3/4 eaten, NAD HEENT: EOMI, oral membranes moist Neck: supple Cardiovascular: RRR without murmur. No JVD    Respiratory: CTA Bilaterally without wheezes or rales. Normal effort    GI: BS +, non-tender, non-distended  Skin: Warm and dry.  L BKA unwrapped- staples in place- some dark blood/like from blood clot draining from middle of L BKA incision- area where it's draining appears slightly swollen, no erythema; like blood clot underneath skin-  Psych: Normal mood.  Normal behavior. Bright affect Musc: left BK tender but much better Neuro: Alert Motor: Strength 4+/5 RUE and RLE LLE >/3/5, limited by pain Skin: Dressing to b/l LE C/D/I Right Foot dry, 5th toe amp site clean,intact. skin otherwise intact RLE, Psychiatric: pleasant.   Assessment/Plan: 1. 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  Physiatrist is providing close team supervision and 24 hour management of active medical problems listed below.  Physiatrist and rehab team continue to assess barriers to discharge/monitor patient progress toward functional and medical goals  Care Tool:  Bathing  Bathing activity did not occur: Refused Body parts bathed by patient: Right arm, Left arm, Abdomen, Chest, Front perineal area, Left upper leg, Right upper leg, Face   Body parts bathed by helper: Buttocks Body parts n/a: Right lower leg, Left lower leg   Bathing assist Assist Level: Minimal Assistance - Patient > 75%     Upper Body Dressing/Undressing Upper body dressing   What is the patient wearing?: Pull over shirt    Upper body assist Assist Level: Set up assist    Lower Body Dressing/Undressing Lower body dressing      What is the patient wearing?: Pants     Lower body assist Assist for lower body dressing: Minimal Assistance - Patient > 75%     Toileting Toileting Toileting Activity did not occur (Clothing management and hygiene only): N/A (no void or bm)  Toileting assist Assist for toileting: Moderate Assistance - Patient 50 - 74%     Transfers Chair/bed transfer  Transfers assist     Chair/bed transfer assist level: Contact Guard/Touching  assist     Locomotion Ambulation   Ambulation assist   Ambulation activity did not occur: Safety/medical concerns(decreased strength/activity tolerance)          Walk 10 feet activity   Assist  Walk 10 feet activity did not occur: Safety/medical concerns(decreased strength/activity tolerance)        Walk 50 feet activity   Assist Walk 50 feet with 2 turns activity did not occur: Safety/medical concerns(decreased strength/activity tolerance)         Walk 150 feet  activity   Assist Walk 150 feet activity did not occur: Safety/medical concerns(decreased strength/activity tolerance)         Walk 10 feet on uneven surface  activity   Assist Walk 10 feet on uneven surfaces activity did not occur: Safety/medical concerns(decreased strength/activity tolerance)         Wheelchair     Assist Will patient use wheelchair at discharge?: Yes Type of Wheelchair: Manual    Wheelchair assist level: Supervision/Verbal cueing Max wheelchair distance: 150 ft    Wheelchair 50 feet with 2 turns activity    Assist        Assist Level: Supervision/Verbal cueing   Wheelchair 150 feet activity     Assist Wheelchair 150 feet activity did not occur: Safety/medical concerns(decreased strength/activity tolerance)   Assist Level: Supervision/Verbal cueing   Plan: 1.  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  Cont CIR PT, OT  2.  Antithrombotics: -DVT/anticoagulation:  Pharmaceutical: Heparin due to ESRD on HD             -antiplatelet therapy: Low dose ASA 3. Pain Management: Due to L BKA, phantom pain, residual limb pain, and R MTP amputation residual limb pain, esp after revascularization procedure.  Continue to stress importance of edema mgt/snug ACE wrap/manual desensitization, etc to help decreased stump pain.   Increased Oxycodone to 5 mg q6 hours prn and added a tramadol 50 mg BID for more steady pain control   Stable on 8/16  8/17- pain better today than Sunday/over weekend 4. Mood: LCSW to follow for evaluation and support.              -antipsychotic agents: NA 5. Neuropsych: This patient is capable of making decisions on his own behalf at this time,  8/17- much more with it, appropriate, Ox3 6. Skin/Wound Care: Monitor right foot wound --continue santyl with damp to dry  dressing on. 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   Cont dressing to left BKA, will consider shrinker this coming week  8/17- ordered rentention sock last week- hasn't received  8/18- will have nursing redress LLE and monitor- will recheck Hb with labs 8/19 to make sure no drop in Hb.  7. Fluids/Electrolytes/Nutrition: Strict I/O's although anuric--1200 cc FR/day. Labs with HD- M/W/F-  8. ESRD: Cont HD MWF at end of the day to help with tolerance of therapy. Will not change to T/H/S unless pt cannot tolerate current schedule.  Managed per nephrology   9. Chronic hypotension with previous hx of HTN: Continue midodrine on HD days. 10 mg dose.  Inconsistent control which is likely related to HD 10. PAD/right foot ulcer s/p graft: WBAT with darco shoe-   8/12- according to Podiatry, can WBAT in surgical shoe 11. Anemia of chronic disease: Continue to monitor--ESA/Fe  per nephrology -   Hb 8.7 on 8/12  Cont to monitor  -follow up labs with HD 12. Hypoalbuminemia: Continue supplement to promote healing.  54. Dispo- will discharge to home, with family.    d/c date set for 8/20  8/18- daughter coming in for training on 8/19 in AM before HD  14. Drug induced constipation  Improving overall-last recorded bowel movement 8/14, continent, will increase Colace to 200 mg twice daily  8/17- LBM last night- doing better  LOS: 12 days A FACE TO FACE EVALUATION WAS PERFORMED  Raenah Murley 03/29/2019, 2:11 PM

## 2019-03-29 NOTE — Progress Notes (Signed)
Physical Therapy Session Note  Patient Details  Name: Timothy Faulkner MRN: [id number] Date of Birth: 10-04-1945  Today's Date: 03/29/2019 PT Individual Time: 1302-1359 PT Individual Time Calculation (min): 57 min   Short Term Goals: Week 2:  PT Short Term Goal 1 (Week 2): = LTGs due to LOS  Skilled Therapeutic Interventions/Progress Updates:    Pt supine in bed upon PT arrival, agreeable to therapy tx and denies pain. Pt transferred to sitting EOB and performed lateral scoot into w/c with min assist. Pt just received lunch, pt ate lunch while therapist provided education to pt regarding d/c planning, HEP and plan for family education tomorrow. Pt propelled w/c to the ortho gym and performed car transfer with slideboard and min assist, cues for techniques. Pt propelled w/c to the gym x 150 ft with B UEs and supervision, lateral scoot onto mat with CGA. Pt worked on sit<>stands and standing balance this session with use of RW. Performed x 3 sit<>stands throughout the session with RW and min assist, cues for techniques. In standing pt worked on standing balance while tossing horseshoes, x 2 trials with min assist for standing balance, single UE support on RW. Pt seated edge of mat performed 2 x 10 L LE LAQ for strengthening and ROM. Pt transferred back to w/c lateral scoot CGA and transported back to room. Pt performed squat pivot to the bed with CGA and transferred to supine with supervision. Pt left supine in bed with pillow under LEs for elevation. Bed alarm set and needs in reach.   Therapy Documentation Precautions:  Precautions Precautions: Fall Restrictions Weight Bearing Restrictions: Yes RLE Weight Bearing: Weight bearing as tolerated LLE Weight Bearing: Non weight bearing Other Position/Activity Restrictions: NWB LLE    Therapy/Group: Individual Therapy  Netta Corrigan, PT, DPT 03/29/2019, 10:48 AM

## 2019-03-30 ENCOUNTER — Encounter (HOSPITAL_COMMUNITY): Payer: Medicare Other

## 2019-03-30 ENCOUNTER — Inpatient Hospital Stay (HOSPITAL_COMMUNITY): Payer: Medicare Other | Admitting: Occupational Therapy

## 2019-03-30 ENCOUNTER — Ambulatory Visit (HOSPITAL_COMMUNITY): Payer: Medicare Other

## 2019-03-30 LAB — CBC WITH DIFFERENTIAL/PLATELET
Abs Immature Granulocytes: 0.02 10*3/uL (ref 0.00–0.07)
Basophils Absolute: 0 10*3/uL (ref 0.0–0.1)
Basophils Relative: 1 %
Eosinophils Absolute: 0.3 10*3/uL (ref 0.0–0.5)
Eosinophils Relative: 5 %
HCT: 29.3 % — ABNORMAL LOW (ref 39.0–52.0)
Hemoglobin: 8.6 g/dL — ABNORMAL LOW (ref 13.0–17.0)
Immature Granulocytes: 0 %
Lymphocytes Relative: 19 %
Lymphs Abs: 1 10*3/uL (ref 0.7–4.0)
MCH: 30 pg (ref 26.0–34.0)
MCHC: 29.4 g/dL — ABNORMAL LOW (ref 30.0–36.0)
MCV: 102.1 fL — ABNORMAL HIGH (ref 80.0–100.0)
Monocytes Absolute: 0.6 10*3/uL (ref 0.1–1.0)
Monocytes Relative: 11 %
Neutro Abs: 3.4 10*3/uL (ref 1.7–7.7)
Neutrophils Relative %: 64 %
Platelets: 245 10*3/uL (ref 150–400)
RBC: 2.87 MIL/uL — ABNORMAL LOW (ref 4.22–5.81)
RDW: 18.7 % — ABNORMAL HIGH (ref 11.5–15.5)
WBC: 5.2 10*3/uL (ref 4.0–10.5)
nRBC: 0 % (ref 0.0–0.2)

## 2019-03-30 MED ORDER — MIDODRINE HCL 10 MG PO TABS: 10.0000 mg | ORAL_TABLET | ORAL | 6 refills | Status: AC

## 2019-03-30 MED ORDER — DARBEPOETIN ALFA 100 MCG/0.5ML IJ SOSY
100.0000 ug | PREFILLED_SYRINGE | INTRAMUSCULAR | Status: DC
  Administered 2019-03-30: 100 ug via INTRAVENOUS

## 2019-03-30 MED ORDER — CALCIUM CARBONATE ANTACID 1250 MG/5ML PO SUSP: 500.0000 mg | Freq: Four times a day (QID) | ORAL | 0 refills | Status: AC | PRN

## 2019-03-30 MED ORDER — DOCUSATE SODIUM 100 MG PO CAPS: 200.0000 mg | ORAL_CAPSULE | Freq: Two times a day (BID) | ORAL | 0 refills | Status: AC

## 2019-03-30 MED ORDER — DARBEPOETIN ALFA 100 MCG/0.5ML IJ SOSY
PREFILLED_SYRINGE | INTRAMUSCULAR | Status: AC
  Filled 2019-03-30: qty 0.5

## 2019-03-30 MED ORDER — ASPIRIN EC 81 MG PO TBEC: 81.0000 mg | DELAYED_RELEASE_TABLET | Freq: Every day | ORAL | 2 refills | Status: AC

## 2019-03-30 MED ORDER — CALCITRIOL 0.5 MCG PO CAPS
ORAL_CAPSULE | ORAL | Status: AC
  Filled 2019-03-30: qty 1

## 2019-03-30 MED ORDER — COLLAGENASE 250 UNIT/GM EX OINT: TOPICAL_OINTMENT | Freq: Every day | CUTANEOUS | 0 refills | Status: DC

## 2019-03-30 MED ORDER — CINACALCET HCL 30 MG PO TABS: 30.0000 mg | ORAL_TABLET | ORAL | 0 refills | Status: AC

## 2019-03-30 MED ORDER — TRAMADOL HCL 50 MG PO TABS: 50.0000 mg | ORAL_TABLET | Freq: Two times a day (BID) | ORAL | 0 refills | Status: DC

## 2019-03-30 MED ORDER — CLOPIDOGREL BISULFATE 75 MG PO TABS: 75.0000 mg | ORAL_TABLET | Freq: Every day | ORAL | 0 refills | Status: AC

## 2019-03-30 MED ORDER — METHOCARBAMOL 500 MG PO TABS: 500.0000 mg | ORAL_TABLET | Freq: Two times a day (BID) | ORAL | 0 refills | Status: AC | PRN

## 2019-03-30 MED ORDER — DARBEPOETIN ALFA 100 MCG/0.5ML IJ SOSY: 100.0000 ug | PREFILLED_SYRINGE | INTRAMUSCULAR | Status: DC

## 2019-03-30 MED ORDER — HEPARIN SODIUM (PORCINE) 1000 UNIT/ML IJ SOLN
INTRAMUSCULAR | Status: AC
  Administered 2019-03-30: 1000 [IU]
  Filled 2019-03-30: qty 5

## 2019-03-30 MED ORDER — ROSUVASTATIN CALCIUM 10 MG PO TABS: 10.0000 mg | ORAL_TABLET | Freq: Every day | ORAL | 0 refills | Status: AC

## 2019-03-30 MED ORDER — OXYCODONE HCL 5 MG PO TABS: 5.0000 mg | ORAL_TABLET | Freq: Four times a day (QID) | ORAL | 0 refills | Status: DC | PRN

## 2019-03-30 MED ORDER — CALCITRIOL 0.5 MCG PO CAPS: 0.5000 ug | ORAL_CAPSULE | ORAL | 0 refills | Status: DC

## 2019-03-30 NOTE — Progress Notes (Signed)
Occupational Therapy Session Note  Patient Details  Name: Timothy Faulkner MRN: [id number] Date of Birth: 1946/05/14  Today's Date: 03/30/2019 OT Individual Time: 0730-0830 OT Individual Time Calculation (min): 60 min    Short Term Goals: Week 2:  OT Short Term Goal 1 (Week 2): Pt will thread BLE into pants wiht S and AE PRN OT Short Term Goal 2 (Week 2): Pt will maintain standing balance with min A for clothing management OT Short Term Goal 3 (Week 2): Pt will perform toilet transfer with min A  Skilled Therapeutic Interventions/Progress Updates:    Treatment session with focus on self-care retraining, transfers, and care for residual limb.  Pt completed bed mobility with supervision and transferred bed > w/c with use of slide board.  Transfer completed with CGA after setup from therapist. Pt demonstrating much improved initiation and sequencing during mobility this session.  Engaged in bathing and dressing at sit > stand level at sink.  Cues to lock w/c brakes prior to standing.  Wash buttocks in standing with min assist for standing balance and therapist assisting with pulling shorts over hips.  Engaged in there ex in supine with residual limb, focus on knee extension and hip flexion and abduction with 2 sets of 10 each.  Educated on limb wrapping, pt reporting discomfort in residual limb - RN notified.  Slide board transfer w/c <> therapy mat with setup and CGA for transfer.  Pt remained upright in w/c with seat belt alarm on and all needs in reach.  Therapy Documentation Precautions:  Precautions Precautions: Fall Restrictions Weight Bearing Restrictions: Yes RLE Weight Bearing: Weight bearing as tolerated LLE Weight Bearing: Non weight bearing Other Position/Activity Restrictions: NWB LLE Vital Signs: Therapy Vitals Temp: 97.9 F (36.6 C) Temp Source: Oral Pulse Rate: 91 Resp: 18 BP: (!) 144/78 Patient Position (if appropriate): Lying Oxygen Therapy SpO2: 98 % O2 Device:  Room Air Pain:  Pt with c/o pain in Lt residual limb.  RN notified.   Therapy/Group: Individual Therapy  Simonne Come 03/30/2019, 8:18 AM

## 2019-03-30 NOTE — Progress Notes (Signed)
Occupational Therapy Session Note  Patient Details  Name: Timothy Faulkner MRN: [id number] Date of Birth: [birth year]  Today's Date: 03/30/2019 OT Individual Time: 1100-1155 OT Individual Time Calculation (min): 55 min    Short Term Goals: Week 2:  OT Short Term Goal 1 (Week 2): Pt will thread BLE into pants wiht S and AE PRN OT Short Term Goal 2 (Week 2): Pt will maintain standing balance with min A for clothing management OT Short Term Goal 3 (Week 2): Pt will perform toilet transfer with min A  Skilled Therapeutic Interventions/Progress Updates:    Pt resting in w/c upon arrival. Pt's daughter present for education. Pt and daughter verbalized understanding of recommendation for 24 hour supervision and assistance with LB dressing and toileting tasks.  Pt practiced sliding board transfers with daughter at supervision level.  Discussed use of drop arm BSC and practiced transfer with sliding board.  Discussed placement of BSC and recommendation.  Daughter verbalized understanding. Pt remained in bed with bed alarm activated and daughter present.  Pt and daughter pleased with progress and ready for discharge tomorrow.   Therapy Documentation Precautions:  Precautions Precautions: Fall Restrictions Weight Bearing Restrictions: Yes RLE Weight Bearing: Weight bearing as tolerated LLE Weight Bearing: Non weight bearing Other Position/Activity Restrictions: NWB LLE   Pain:  Pt denies pain this morning   Therapy/Group: Individual Therapy  Leroy Libman 03/30/2019, 11:58 AM

## 2019-03-30 NOTE — Patient Care Conference (Signed)
Inpatient RehabilitationTeam Conference and Plan of Care Update Date: 03/30/2019   Time: 10:05 AM    Patient Name: Timothy Faulkner      Medical Record Number: [id number]  Date of Birth: 17-Nov-1945 Sex: Male         Room/Bed: 4M13C/4M13C-01 Payor Info: Payor: [employer] / Plan: [employer] MEDICARE / Product Type: *No Product type* /    Admitting Diagnosis: 4. Gen Team LT. BKA, gangrene;  Admit Date/Time:  03/17/2019  3:53 PM Admission Comments: No comment available   Primary Diagnosis:  S/P BKA (below knee amputation), left (HCC) Principal Problem: S/P BKA (below knee amputation), left [facility])  Patient Active Problem List   Diagnosis Date Noted  . Drug induced constipation   . Anemia of chronic disease   . ESRD on dialysis ([place])   . Postoperative pain   . Phantom limb pain ([place])   . S/P BKA (below knee amputation), left ([place]) 03/18/2019  . Unilateral complete BKA, left, initial encounter ([place]) 03/17/2019  . Critical lower limb ischemia 03/12/2019  . Hypotension 02/23/2019  . Cellulitis of left foot 02/23/2019  . Gangrene of toe of left foot ([place]) 02/22/2019  . 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 Ulcer of right foot with fat layer exposed ([place]) 09/02/2018  . Encounter for planned post-operative wound closure   . Acute osteomyelitis of metatarsal bone of right foot ([place])   . 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 Osteomyelitis of right foot ([place])   . Vascular calcification   . Cellulitis and abscess of foot, except toes   . Anemia associated with chronic renal failure 07/13/2018  . Cardiomyopathy, dilated, nonischemic ([place]) 07/13/2018  . Diabetic foot infection ([place]) 07/13/2018  . PAD (peripheral artery disease) ([place])   . Macular degeneration   . Chronic systolic heart failure ([place]) 08/22/2014  . Diabetic infection of right foot ([place]) 07/13/2014  . ESRD (end stage  renal disease) on dialysis ([place]) 11/11/2013  . Snoring 11/11/2013  . Unspecified sleep apnea 11/11/2013  . Other pancytopenia ([place]) 09/22/2013  . Hemoptysis 09/20/2013  . Pre-transplant evaluation for kidney transplant 01/11/2013  . Automatic implantable cardioverter-defibrillator in situ 10/01/2010  . HYPERCHOLESTEROLEMIA, MIXED 05/03/2010  . Essential hypertension 05/03/2010  . CHF 05/03/2010    Expected Discharge Date: Expected Discharge Date: 03/31/19  Team Members Present: Physician leading conference: Dr. Alysia Penna Social Worker Present: Ovidio Kin, LCSW Nurse Present: Judee Clara, LPN PT Present: Burnard Bunting, PT OT Present: Darleen Crocker, OT;Roanna Epley, COTA SLP Present: Weston Anna, SLP PPS Coordinator present : Gunnar Fusi, SLP     Current Status/Progress Goal Weekly Team Focus  Medical   pt ready for d/c tomorrow after HD today and family training 8/19  d/c home 8/20  d/c home safely   Bowel/Bladder   continent of bowel, LBM cahrted was 8/14 but provider note states 8/17, PRN for constipation given, pt is anuric  reamin continent thorughout admission  assess toileting q shift and prn   Swallow/Nutrition/ Hydration             ADL's   bathing-min A sink level; LB dressing min A; functional transfers (sliding board)-CGA/supervision(setup); occasional disoriented with delayed processing  min A overall; toilet transfers-supervision  transfers, sit<>stand, education, discharge planning   Mobility   min assist lateral scoot transfers, min assist sit<>stand with RW, supervision w/c propulsion  S bed mobility and basic  tr, min assist car, s w/c x 150'  family education, transfers, balance, d/c planning   Communication             Safety/Cognition/ Behavioral Observations            Pain   pt has c/o pain, 8/10, has scheduled and PRNs  have pain less than 5  assess pan q shift and prn   Skin   L. BKA (ace wrap), R. foot wound (santyl, wet to dry)   address current skin status, prevent new/further breakdown  assess skin q shift and prn      *See Care Plan and progress notes for long and short-term goals.     Barriers to Discharge  Current Status/Progress Possible Resolutions Date Resolved   Physician    Decreased caregiver support;Wound Care;Weight bearing restrictions;Hemodialysis  n/a  supervision to min assist for ADLs/mobility  n/a      Nursing                  PT                    OT                  SLP                SW                Discharge Planning/Teaching Needs:  Daughter to be here today for education in prepartion for DC tomorrow. She was doing dressing changes prior to admission to the hospital.      Team Discussion:  Progressing toward his goals of supervision-min assist level. Family education today with his daughter. Pain is managed and feels ready for DC. Medically stable and wounds look better, daughter awar eof how to do dressing changes.   Revisions to Treatment Plan:  DC 8/20    Continued Need for Acute Rehabilitation Level of Care: The patient requires daily medical management by a physician with specialized training in physical medicine and rehabilitation for the following conditions: Daily direction of a multidisciplinary physical rehabilitation program to ensure safe treatment while eliciting the highest outcome that is of practical value to the patient.: Yes Daily medical management of patient stability for increased activity during participation in an intensive rehabilitation regime.: Yes Daily analysis of laboratory values and/or radiology reports with any subsequent need for medication adjustment of medical intervention for : Renal problems;Diabetes problems;Wound care problems;Nutritional problems;Blood pressure problems   I attest that I was present, lead the team conference, and concur with the assessment and plan of the team. Teleconference held due to COVID 19   Charnell Peplinski, Gardiner Rhyme 03/31/2019, 12:06 PM

## 2019-03-30 NOTE — Progress Notes (Addendum)
Subjective:  No cos, daughter in room with  OT dw going home instuctions for tomorrow/ HD today   Objective Vital signs in last 24 hours: Vitals:   03/29/19 1459 03/29/19 1929 03/30/19 0459 03/30/19 0500  BP: (!) 109/55 (!) 102/59 (!) 144/78   Pulse: 82 97 91   Resp: 18 18 18    Temp: 97.6 F (36.4 C) 98.6 F (37 C) 97.9 F (36.6 C)   TempSrc: Oral Oral Oral   SpO2: 98% 91% 98%   Weight:    75.1 kg  Height:       Weight change: 0 kg  Physical Exam: General:alert , Male NAD Heart: RRR, no murmurs, rubs or gallops Lungs: CTA bilaterally without wheezing, rhonchi or rales Abdomen: Soft, non-tender, non-distended +BS Extremities: No edema. LBKA stump and R foot wrapped.Dry clean Dressings  Dialysis Access: Right IJ TDC,no erythema/drainage  OpDialysis Orders: 4h 400/800 78kg 3K/2.5Ca bat Hep 5000 +2000 midrun R IJ TDC Calcitriol 0.5  no Mircera or Fe Recent labs: iPTH 248 Ca/P ok hgb 11.8 31% sat  Problem/Plan: 1. PAD with critical limb ischemia - SPBKA8/11/2018. On CIR now. 2. Enterobacter wound culture 7/28 left foot -SP L BKA hascompleted course of Zosyn and Vanc  3. ESRD- MWFHD. HDon schedule  4. Hypertension/volume- using midodrine for BP support. No edema or SOB.Will have lower HD at discharge s/p BKA.  5. Anemia-hgb 9.4>8.8>11.0 ? 8/17 PRE HD / hgb was  down post op, startedaranesp8/10.Iron sat 23% - getting 0.5g IV iron over 4 tx starting 8/12.hold esa if still high  6. Metabolic bone disease-Calcium corrected 10.2. Lowered dose of calcitriol. Continue sensipar. Auryxia binder on hold d/t low phos -3.23monitor .  7. NutritionAlbumin 2.8. Continue supplement. Renal diet with fluid restrictions recommended. 8. DM- Per primary 9. Hx NICM s/p ICD Dispo: EDD 8/20 planned  [name], PA-C [facility] Beeper [phone] 03/30/2019,10:53 AM  LOS: 13 days   Pt seen, examined and agree w A/P as above.  [name]  MD 03/30/2019, 1:27 PM    Labs: Basic Metabolic Panel: Recent Labs  Lab 03/23/19 1257 03/25/19 1230 03/28/19 1414  NA 133* 136 135  K 3.8 3.6 3.6  CL 94* 96* 95*  CO2 26 25 24   GLUCOSE 88 81 87  BUN 37* 34* 35*  CREATININE 6.85* 6.85* 8.22*  CALCIUM 9.0 9.3 9.0  PHOS 3.1 3.2 4.4   Liver Function Tests: Recent Labs  Lab 03/23/19 1257 03/25/19 1230 03/28/19 1414  ALBUMIN 2.6* 2.8* 2.8*   No results for input(s): LIPASE, AMYLASE in the last 168 hours. No results for input(s): AMMONIA in the last 168 hours. CBC: Recent Labs  Lab 03/23/19 1256 03/25/19 1230 03/28/19 1415  WBC 6.8 6.9 4.5  HGB 8.7* 8.8* 11.0*  HCT 29.4* 30.1* 36.9*  MCV 101.0* 103.1* 101.1*  PLT 287 279 241   Cardiac Enzymes: No results for input(s): CKTOTAL, CKMB, CKMBINDEX, TROPONINI in the last 168 hours. CBG: No results for input(s): GLUCAP in the last 168 hours.  Studies/Results: No results found. Medications: . ferric gluconate (FERRLECIT/NULECIT) IV 125 mg (03/28/19 1620)   . aspirin EC  81 mg Oral Daily  . calcitRIOL  0.5 mcg Oral Q M,W,F-HD  . Chlorhexidine Gluconate Cloth  6 each Topical Q0600  . Chlorhexidine Gluconate Cloth  6 each Topical Q0600  . cinacalcet  30 mg Oral QODAY  . collagenase   Topical Daily  . docusate sodium  200 mg Oral BID  . feeding  supplement (NEPRO CARB STEADY)  237 mL Oral BID BM  . feeding supplement (PRO-STAT SUGAR FREE 64)  30 mL Oral BID  . heparin  5,000 Units Subcutaneous Q8H  . midodrine  10 mg Oral Q M,W,F-HD  . multivitamin  1 tablet Oral QHS  . rosuvastatin  10 mg Oral q1800  . traMADol  50 mg Oral Q12H

## 2019-03-30 NOTE — Progress Notes (Signed)
Timothy Faulkner PHYSICAL MEDICINE & REHABILITATION PROGRESS NOTE   Subjective/Complaints:  Pt reports doing great- ready to be discharged tomorrow- daughter is coming in for family education today.   No more significant drainage per pt- per OT, dressing was C/D/I- no drainage on dressing this AM.  ROS: Patient denies, nausea, vomiting, diarrhea, cough, , joint or back pain, headache, or mood change.    Objective:   No results found. Recent Labs    03/28/19 1415 03/30/19 1440  WBC 4.5 5.2  HGB 11.0* 8.6*  HCT 36.9* 29.3*  PLT 241 245   Recent Labs    03/28/19 1414  NA 135  K 3.6  CL 95*  CO2 24  GLUCOSE 87  BUN 35*  CREATININE 8.22*  CALCIUM 9.0    Intake/Output Summary (Last 24 hours) at 03/30/2019 1617 Last data filed at 03/30/2019 1300 Gross per 24 hour  Intake 240 ml  Output -  Net 240 ml     Physical Exam: Vital Signs Blood pressure 106/78, pulse 80, temperature 98 F (36.7 C), temperature source Oral, resp. rate 20, height 5\' 8"  (1.727 m), weight 74 kg, SpO2 100 %. Constitutional:  Vitals and labs reviewed. Pt awake, alert, appropriate, siting up on mat in gym with PT, appears comfortable, bright affect, NAD HEENT: EOMI, oral membranes moist Cardiovascular: RRR without murmur.     Respiratory: CTA Bilaterally without wheezes or rales. Normal effort    GI: BS +, non-tender, non-distended  Skin: Warm and dry.  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  Normal behavior. Bright affect Musc: left BK not real tender anymore Neuro: Alert Motor: Strength 4+/5 RUE and RLE LLE >/3/5, limited by pain Skin: Dressing to b/l LE C/D/I Right Foot dry, 5th toe amp site clean,intact. skin otherwise intact RLE, Psychiatric: pleasant.   Assessment/Plan: 1. 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  Physiatrist is  providing close team supervision and 24 hour management of active medical problems listed below.  Physiatrist and rehab team continue to assess barriers to discharge/monitor patient progress toward functional and medical goals  Care Tool:  Bathing  Bathing activity did not occur: Refused Body parts bathed by patient: Right arm, Left arm, Abdomen, Chest, Front perineal area, Left upper leg, Right upper leg, Face, Buttocks   Body parts bathed by helper: Buttocks Body parts n/a: Right lower leg, Left lower leg   Bathing assist Assist Level: Minimal Assistance - Patient > 75%     Upper Body Dressing/Undressing Upper body dressing   What is the patient wearing?: Pull over shirt    Upper body assist Assist Level: Set up assist    Lower Body Dressing/Undressing Lower body dressing      What is the patient wearing?: Pants     Lower body assist Assist for lower body dressing: Minimal Assistance - Patient > 75%     Toileting Toileting Toileting Activity did not occur (Clothing management and hygiene only): N/A (no void or bm)  Toileting assist Assist for toileting: Minimal Assistance - Patient > 75%     Transfers Chair/bed transfer  Transfers assist     Chair/bed transfer assist level: Contact Guard/Touching assist     Locomotion Ambulation   Ambulation assist   Ambulation activity did not occur: Safety/medical concerns          Walk 10 feet  activity   Assist  Walk 10 feet activity did not occur: Safety/medical concerns        Walk 50 feet activity   Assist Walk 50 feet with 2 turns activity did not occur: Safety/medical concerns         Walk 150 feet activity   Assist Walk 150 feet activity did not occur: Safety/medical concerns         Walk 10 feet on uneven surface  activity   Assist Walk 10 feet on uneven surfaces activity did not occur: Safety/medical concerns         Wheelchair     Assist Will patient use wheelchair at  discharge?: Yes Type of Wheelchair: Manual    Wheelchair assist level: Supervision/Verbal cueing Max wheelchair distance: 150 ft    Wheelchair 50 feet with 2 turns activity    Assist        Assist Level: Supervision/Verbal cueing   Wheelchair 150 feet activity     Assist Wheelchair 150 feet activity did not occur: Safety/medical concerns(decreased strength/activity tolerance)   Assist Level: Supervision/Verbal cueing   Plan: 1.  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  Cont CIR PT, OT  2.  Antithrombotics: -DVT/anticoagulation:  Pharmaceutical: Heparin due to ESRD on HD             -antiplatelet therapy: Low dose ASA 3. Pain Management: Due to L BKA, phantom pain, residual limb pain, and R MTP amputation residual limb pain, esp after revascularization procedure.  Continue to stress importance of edema mgt/snug ACE wrap/manual desensitization, etc to help decreased stump pain.   Increased Oxycodone to 5 mg q6 hours prn and added a tramadol 50 mg BID for more steady pain control   Stable on 8/16  8/17- pain better today than Sunday/over weekend 4. Mood: LCSW to follow for evaluation and support.              -antipsychotic agents: NA 5. Neuropsych: This patient is capable of making decisions on his own behalf at this time,  8/17- much more with it, appropriate, Ox3 6. Skin/Wound Care: Monitor right foot wound --continue santyl with damp to dry dressing on. 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   Cont dressing to left BKA, will consider shrinker this coming week  8/17- ordered rentention sock last week- hasn't received  8/18- will have nursing redress LLE and monitor- will recheck Hb with labs 8/19 to make sure no drop in Hb.  7. Fluids/Electrolytes/Nutrition: Strict I/O's although  anuric--1200 cc FR/day. Labs with HD- M/W/F-  8. ESRD: Cont HD MWF at end of the day to help with tolerance of therapy. Will not change to T/H/S unless pt cannot tolerate current schedule.  Managed per nephrology   9. Chronic hypotension with previous hx of HTN: Continue midodrine on HD days. 10 mg dose.  Inconsistent control which is likely related to HD 10. PAD/right foot ulcer s/p graft: WBAT with darco shoe-   8/12- according to Podiatry, can WBAT in surgical shoe 11. Anemia of chronic disease: Continue to monitor--ESA/Fe per nephrology -   Hb 8.7 on 8/12  Cont to monitor  -follow up labs with HD  8/19- Hb was up to 11, then back down to 8.6 on 8/19- likely due to fluid changes, not bleeding- will con't  to monitor 12. Hypoalbuminemia: Continue supplement to promote healing.  75. Dispo- will discharge to home, with family.    d/c date set for 8/20  8/18- daughter coming in for training on 8/19 in AM before HD  14. Drug induced constipation  Improving overall-last recorded bowel movement 8/14, continent, will increase Colace to 200 mg twice daily  8/17- LBM last night- doing better  LOS: 13 days A FACE TO FACE EVALUATION WAS PERFORMED  Oretha Weismann 03/30/2019, 4:17 PM

## 2019-03-30 NOTE — Progress Notes (Signed)
Social Work Patient ID: Timothy Faulkner, male   DOB: 1946-07-06, 73 y.o.   MRN: [id number] Daughter is here to go through education and felt he is doing well. She feels comfortable with his care and both are ready for discharge tomorrow. Equipment ordered and follow up to resume via [facility]

## 2019-03-30 NOTE — Progress Notes (Signed)
Occupational Therapy Discharge Summary  Patient Details  Name: Timothy Faulkner MRN: [id number] Date of Birth: [birth year]  Patient has met 9 of 9 long term goals due to improved balance, postural control, ability to compensate for deficits and improved awareness.  Pt made steady progress with BADLs and functional transfers during this admission. Pt requires min A for sit<>stand, LB dressing tasks, and toileting tasks.  Pt performs sliding board transfers at supervision level. Pt requires min verbal cues for safety awareness during functional tasks.  Pt's daughter has been present and participated in therapy session.  Pt and daughter pleased with progress and ready for discharge tomorrow. Patient to discharge at [facility] Assist level.  Patient's care partner is independent to provide the necessary physical and cognitive assistance at discharge.    Recommendation:  Patient will benefit from ongoing skilled OT services in home health setting to continue to advance functional skills in the area of BADL and Reduce care partner burden.  Equipment: drop arm bsc  Reasons for discharge: treatment goals met and discharge from hospital  Patient/family agrees with progress made and goals achieved: Yes  OT Discharge   Vision Baseline Vision/History: Wears glasses;Legally blind Wears Glasses: At all times Patient Visual Report: No change from baseline Additional Comments: Legally blind in R eye and blurred vision in L eye Perception  Perception: Within Functional Limits Praxis Praxis: Intact Cognition Overall Cognitive Status: Within Functional Limits for tasks assessed Arousal/Alertness: Awake/alert Orientation Level: Oriented X4 Focused Attention: Appears intact Focused Attention Impairment: Verbal basic;Functional basic Sustained Attention: Appears intact Sustained Attention Impairment: Verbal basic;Functional basic Memory: Appears intact Awareness: Impaired Awareness Impairment:  Emergent impairment Problem Solving: Impaired Problem Solving Impairment: Functional basic Safety/Judgment: Impaired Sensation Sensation Light Touch: Impaired by gross assessment Coordination Gross Motor Movements are Fluid and Coordinated: No Fine Motor Movements are Fluid and Coordinated: Yes Motor  Motor Motor - Skilled Clinical Observations: L BKA and generalized weakness with decreased activity tolerance    Trunk/Postural Assessment  Cervical Assessment Cervical Assessment: Exceptions to WFL(forward head) Thoracic Assessment Thoracic Assessment: Exceptions to WFL(rounded shoulders) Lumbar Assessment Lumbar Assessment: Exceptions to WFL(posterior pelvic tilt)  Balance Static Sitting Balance Static Sitting - Balance Support: Bilateral upper extremity supported;Feet supported Static Sitting - Level of Assistance: 5: Stand by assistance Dynamic Sitting Balance Dynamic Sitting - Balance Support: Feet supported Dynamic Sitting - Level of Assistance: 5: Stand by assistance Extremity/Trunk Assessment RUE Assessment RUE Assessment: Within Functional Limits LUE Assessment LUE Assessment: Within Functional Limits   Leroy Libman 03/30/2019, 6:54 AM

## 2019-03-30 NOTE — Progress Notes (Signed)
Physical Therapy Discharge Summary  Patient Details  Name: Timothy Faulkner MRN: [id number] Date of Birth: [birth year]  Today's Date: 03/30/2019 PT Individual Time: 1000-1058 PT Individual Time Calculation (min): 58 min    Patient has met 9 of 9 long term goals due to improved activity tolerance, improved balance, increased strength, decreased pain and ability to compensate for deficits.  Patient to discharge at a wheelchair level supervision/Min assist.   Patient's care partner is independent to provide the necessary physical and cognitive assistance at discharge. Pt's daughter present for family education and has completed hands-on transfers with the patient needed for d/c.   All goals met.   Recommendation:  Patient will benefit from ongoing skilled PT services in home health setting to continue to advance safe functional mobility, address ongoing impairments in balance, strength, ROM, and minimize fall risk.  Equipment: slideboard and w/c  Reasons for discharge: treatment goals met  Patient/family agrees with progress made and goals achieved: Yes   PT Treatment Interventions: Pt seated in w/c upon PT arrival, agreeable to therapy tx and denies pain. Pt's daughter present throughout this session for family education. Therapist demonstrated L lower limb wrapping technique with ACE bandage and provided pt's daughter with handout, pt's daughter also reciprocated the ace wrapping with min cues from therapist. Pt propelled w/c x 150 ft with B UEs and supervision, increased time to complete. Pt performed car transfer this session with CGA and slideboard, performed x1 with therapist assisting and x 1 with assist from daughter, education on techniques. Pt performed squat pivot from w/c<>mat (simulated bed height) with CGA, cues for techniques. Therapist educated pt's daughter on w/c parts management and how to fold w/c. Pt performed sit<>stand from the mat this session with CGA and RW, cues for  techniques. Bed mobility with supervision. Reviewed HEP handout and pt performed L LE LAQ x 10 with emphasis/education on maintaining knee extension for ROM. Pt transported back to room and left in w/c with needs in reach and in care of daughter.   PT Discharge Precautions/Restrictions Precautions Precautions: Fall Restrictions Weight Bearing Restrictions: Yes LLE Weight Bearing: Non weight bearing Other Position/Activity Restrictions: NWB LLE Pain   denies pain at rest Vision/Perception  Vision - Assessment Additional Comments: Legally blind in R eye and blurred vision in L eye Perception Perception: Within Functional Limits Praxis Praxis: Intact  Cognition Overall Cognitive Status: Within Functional Limits for tasks assessed Arousal/Alertness: Awake/alert Orientation Level: Oriented X4 Attention: Focused;Sustained Focused Attention: Appears intact Focused Attention Impairment: Verbal basic;Functional basic Sustained Attention: Appears intact Sustained Attention Impairment: Verbal basic;Functional basic Memory: Appears intact Awareness: Impaired Awareness Impairment: Emergent impairment Problem Solving: Impaired Problem Solving Impairment: Functional basic Safety/Judgment: Impaired Sensation Sensation Light Touch: Impaired by gross assessment(decreased sensation secondary to peripheral neuropathy) Proprioception: Appears Intact Coordination Gross Motor Movements are Fluid and Coordinated: No Fine Motor Movements are Fluid and Coordinated: No Coordination and Movement Description: L BKA and generalized weakness with decreased activity tolerance Motor  Motor Motor - Skilled Clinical Observations: L BKA and generalized weakness with decreased activity tolerance Motor - Discharge Observations: L BKA and generalized weakness with decreased activity tolerance  Mobility Bed Mobility Bed Mobility: Supine to Sit;Rolling Right;Rolling Left;Sit to Supine Rolling Right:  Supervision/verbal cueing Rolling Left: Supervision/Verbal cueing Supine to Sit: Supervision/Verbal cueing Sit to Supine: Supervision/Verbal cueing Transfers Transfers: Sit to Stand;Stand to Sit;Lateral/Scoot Transfers Sit to Stand: Minimal Assistance - Patient > 75% Stand to Sit: Minimal Assistance - Patient > 75% Lateral/Scoot Transfers: Contact Guard/Touching  assist Transfer (Assistive device): Rolling walker Locomotion  Gait Ambulation: No Gait Gait: No Architect: Yes Wheelchair Assistance: Chartered loss adjuster: Both upper extremities Wheelchair Parts Management: Needs assistance Distance: 150 ft  Trunk/Postural Assessment  Cervical Assessment Cervical Assessment: Exceptions to WFL(forward head posture) Thoracic Assessment Thoracic Assessment: Exceptions to WFL(rounded shoulders) Lumbar Assessment Lumbar Assessment: Exceptions to WFL(posterior pelvic tilt) Postural Control Postural Control: Within Functional Limits  Balance Balance Balance Assessed: Yes Static Sitting Balance Static Sitting - Balance Support: Bilateral upper extremity supported;Feet supported Static Sitting - Level of Assistance: 5: Stand by assistance Dynamic Sitting Balance Dynamic Sitting - Balance Support: Feet supported Dynamic Sitting - Level of Assistance: 5: Stand by assistance Static Standing Balance Static Standing - Level of Assistance: 4: Min assist Extremity Assessment  RUE Assessment RUE Assessment: Within Functional Limits LUE Assessment LUE Assessment: Within Functional Limits RLE Assessment RLE Assessment: Exceptions to [facility] Active Range of Motion (AROM) Comments: hip flexion and knee flexion/extension WFL for functional mobility, unable to fully assess ankle ROM due to heel shoe General Strength Comments: Grossly at least 3+/5 with functional mobility LLE Assessment LLE Assessment: Exceptions to [facility] Passive Range of  Motion (PROM) Comments: Decreased knee extension lacking >10 degrees Active Range of Motion (AROM) Comments: Decreased knee extension lacking >10 degrees General Strength Comments: Grossly at least 3-/5 throughout, unable to fully assess due to increased pain in residual limb to light pressure    Netta Corrigan, PT, DPT 03/30/2019, 10:22 AM

## 2019-03-31 NOTE — Progress Notes (Signed)
Social Work Discharge Note   The overall goal for the admission was met for:   Discharge location: Yes-HOME WITH WIFE AND DAUGHTER WHO ASSISTS WITH HIS CARE  Length of Stay: Yes-14 DAYS  Discharge activity level: Yes-SUPERVISION-MIN ASSIST WHEELCHAIR LEVEL  Home/community participation: Yes  Services provided included: MD, RD, PT, OT, RN, CM, Pharmacy, Neuropsych and SW  Financial Services: Private Insurance: [facility]  Follow-up services arranged: Home Health: [facility]-PT, OT, RN, DME: [facility]-WHEELCHAIR, DROP ARM BEDSIDE COMMODE & 30 TRANSFER BOARD and Patient/Family request agency HH: [facility], DME: NO PREF  Comments (or additional information):SHERRY WAS IN FOR EDUCATION YESTERDAY AND IT WENT WELL SHE WAS ASSISTING HIM PRIOR TO ADMISSION TO [place]. COMFORTABLE WITH HIS CARE AND READY FOR DC  Patient/Family verbalized understanding of follow-up arrangements: Yes  Individual responsible for coordination of the follow-up plan: SHERRY-DAUGHTER  Confirmed correct DME delivered: Elease Hashimoto 03/31/2019    Elease Hashimoto

## 2019-03-31 NOTE — Progress Notes (Signed)
[place] PHYSICAL MEDICINE & REHABILITATION PROGRESS NOTE   Subjective/Complaints:  Pt reports ready for d/c home today- daughter feels comfortable with dressing changes per pt. Denies any more drainage from residual limb.  ROS: Patient denies, nausea, vomiting, diarrhea, cough, , joint or back pain, headache, or mood change.    Objective:   No results found. Recent Labs    03/28/19 1415 03/30/19 1440  WBC 4.5 5.2  HGB 11.0* 8.6*  HCT 36.9* 29.3*  PLT 241 245   Recent Labs    03/28/19 1414  NA 135  K 3.6  CL 95*  CO2 24  GLUCOSE 87  BUN 35*  CREATININE 8.22*  CALCIUM 9.0    Intake/Output Summary (Last 24 hours) at 03/31/2019 1031 Last data filed at 03/31/2019 0846 Gross per 24 hour  Intake 220 ml  Output 1500 ml  Net -1280 ml     Physical Exam: Vital Signs Blood pressure 136/89, pulse (!) 110, temperature 98.1 F (36.7 C), temperature source Oral, resp. rate 19, height 5\' 8"  (1.727 m), weight 72.4 kg, SpO2 95 %. Constitutional:  Vitals and labs reviewed. Pulse 110- but was transferring right beforehand Pt awake, alert, appropriate, lying upright in bed, appears comfortable, L BKA ACE wrapped over dressing and RLE in dressing as well, NAD HEENT: EOMI, oral membranes moist Cardiovascular: RRR without murmur.     Respiratory: CTA Bilaterally without wheezes or rales. Normal effort    GI: BS +, non-tender, non-distended  Skin: Warm and dry.  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  Normal behavior. Bright affect Musc: left BK not tender anymore to touch Neuro: Alert Motor: Strength 4+/5 RUE and RLE LLE >/3/5, limited by pain Skin: Dressing to b/l LE C/D/I Right Foot dry, 5th toe amp dressing C/D/I. skin otherwise intact RLE, Psychiatric: pleasant.   Assessment/Plan: 1. 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  Physiatrist is providing close team supervision and 24 hour management of active medical problems listed below.  Physiatrist and rehab team continue to assess barriers to discharge/monitor patient progress toward functional and medical goals  Care Tool:  Bathing  Bathing activity did not occur: Refused Body parts bathed by patient: Right arm, Left arm, Abdomen, Chest, Front perineal area, Left upper leg, Right upper leg, Face, Buttocks   Body parts bathed by helper: Buttocks Body parts n/a: Right lower leg, Left lower leg   Bathing assist Assist Level: Minimal Assistance - Patient > 75%     Upper Body Dressing/Undressing Upper body dressing   What is the patient wearing?: Pull over shirt    Upper body assist Assist Level: Set up assist    Lower Body Dressing/Undressing Lower body dressing      What is the patient wearing?: Pants     Lower body assist Assist for lower body dressing: Minimal Assistance - Patient > 75%     Toileting Toileting Toileting Activity did not occur (Clothing management and hygiene only): N/A (no void or bm)  Toileting assist Assist for toileting: Minimal Assistance - Patient > 75%     Transfers Chair/bed transfer  Transfers assist     Chair/bed transfer assist level: Contact Guard/Touching assist     Locomotion Ambulation   Ambulation assist   Ambulation activity did not occur: Safety/medical concerns          Walk  10 feet activity   Assist  Walk 10 feet activity did not occur: Safety/medical concerns        Walk 50 feet activity   Assist Walk 50 feet with 2 turns activity did not occur: Safety/medical concerns         Walk 150 feet activity   Assist Walk 150 feet activity did not occur: Safety/medical concerns         Walk 10 feet on uneven surface  activity   Assist Walk 10 feet on uneven surfaces activity did not occur: Safety/medical concerns         Wheelchair     Assist Will  patient use wheelchair at discharge?: Yes Type of Wheelchair: Manual    Wheelchair assist level: Supervision/Verbal cueing Max wheelchair distance: 150 ft    Wheelchair 50 feet with 2 turns activity    Assist        Assist Level: Supervision/Verbal cueing   Wheelchair 150 feet activity     Assist Wheelchair 150 feet activity did not occur: Safety/medical concerns(decreased strength/activity tolerance)   Assist Level: Supervision/Verbal cueing   Plan: 1.  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  Cont CIR PT, OT  2.  Antithrombotics: -DVT/anticoagulation:  Pharmaceutical: Heparin due to ESRD on HD             -antiplatelet therapy: Low dose ASA 3. Pain Management: Due to L BKA, phantom pain, residual limb pain, and R MTP amputation residual limb pain, esp after revascularization procedure.  Continue to stress importance of edema mgt/snug ACE wrap/manual desensitization, etc to help decreased stump pain.   Increased Oxycodone to 5 mg q6 hours prn and added a tramadol 50 mg BID for more steady pain control   Stable on 8/16  8/17- pain better today than Sunday/over weekend 4. Mood: LCSW to follow for evaluation and support.              -antipsychotic agents: NA 5. Neuropsych: This patient is capable of making decisions on his own behalf at this time,  8/17- much more with it, appropriate, Ox3 6. Skin/Wound Care: Monitor right foot wound --continue santyl with damp to dry dressing on. 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   Cont dressing to left BKA, will consider shrinker this coming week  8/17- ordered rentention sock last week- hasn't received  8/18- will have nursing redress LLE and monitor- will recheck Hb with labs 8/19 to make sure no drop in Hb.  7. Fluids/Electrolytes/Nutrition:  Strict I/O's although anuric--1200 cc FR/day. Labs with HD- M/W/F-  8. ESRD: Cont HD MWF at end of the day to help with tolerance of therapy. Will not change to T/H/S unless pt cannot tolerate current schedule.  Managed per nephrology   9. Chronic hypotension with previous hx of HTN: Continue midodrine on HD days. 10 mg dose.  Inconsistent control which is likely related to HD 10. PAD/right foot ulcer s/p graft: WBAT with darco shoe-   8/12- according to Podiatry, can WBAT in surgical shoe 11. Anemia of chronic disease: Continue to monitor--ESA/Fe per nephrology -   Hb 8.7 on 8/12  Cont to monitor  -follow up labs with HD  8/19- Hb was up to 11, then back down to 8.6 on 8/19- likely due to fluid changes, not bleeding-  will con't to monitor 12. Hypoalbuminemia: Continue supplement to promote healing.  41. Dispo- will discharge to home, with family.    d/c date set for 8/20  8/20- d/c today  8/18- daughter coming in for training on 8/19 in AM before HD  14. Drug induced constipation  Improving overall-last recorded bowel movement 8/14, continent, will increase Colace to 200 mg twice daily  8/17- LBM last night- doing better  LOS: 14 days A FACE TO FACE EVALUATION WAS PERFORMED  Edelyn Heidel 03/31/2019, 10:31 AM

## 2019-03-31 NOTE — Progress Notes (Signed)
Pt. Got d/c papers and equipment.Pt. is ready to go home.

## 2019-03-31 NOTE — Progress Notes (Signed)
Renal Navigator notified OP HD clinic[facility] of patient's plan for discharge today in order to provide continuity of care.  Alphonzo Cruise, Lupus Renal Navigator [phone]

## 2019-04-04 NOTE — Discharge Summary (Signed)
Physician Discharge Summary  Patient ID: Timothy Faulkner MRN: [id number] DOB/AGE: 73-Jun-1947 73 y.o.  Admit date: 03/17/2019 Discharge date: 04/04/2019  Discharge Diagnoses:  Principal Problem:   S/P BKA (below knee amputation), left ([facility]) Active Problems:   Unilateral complete BKA, left, initial encounter ([facility])   Drug induced constipation   Anemia of chronic disease   ESRD on dialysis ([facility])   Postoperative pain   Phantom limb pain ([facility])   Discharged Condition:  Stable   Significant Diagnostic Studies: Dg Foot Complete Left  Result Date: 03/12/2019 CLINICAL DATA:  Discoloration of the left second toe. History of prior third and fourth toe amputation. EXAM: LEFT FOOT - COMPLETE 3+ VIEW COMPARISON:  Plain films the left 02/22/2019. FINDINGS: The third and fourth toes have been amputated since the prior examination. Dressing is in place. No bony destructive change or periosteal reaction. No acute abnormality. First MTP osteoarthritis noted. No soft tissue gas or radiopaque foreign body. Atherosclerosis is seen. 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 Electronically Signed   By: Inge Rise M.D.   On: 03/12/2019 14:14    Labs:  Basic Metabolic Panel: BMP Latest Ref Rng & Units 03/28/2019 03/25/2019 03/23/2019  Glucose 70 - 99 mg/dL 87 81 88  BUN 8 - 23 mg/dL 35(H) 34(H) 37(H)  Creatinine 0.61 - 1.24 mg/dL 8.22(H) 6.85(H) 6.85(H)  Sodium 135 - 145 mmol/L 135 136 133(L)  Potassium 3.5 - 5.1 mmol/L 3.6 3.6 3.8  Chloride 98 - 111 mmol/L 95(L) 96(L) 94(L)  CO2 22 - 32 mmol/L 24 25 26   Calcium 8.9 - 10.3 mg/dL 9.0 9.3 9.0    CBC: CBC Latest Ref Rng & Units 03/30/2019 03/28/2019 03/25/2019  WBC 4.0 - 10.5 K/uL 5.2 4.5 6.9  Hemoglobin 13.0 - 17.0 g/dL 8.6(L) 11.0(L) 8.8(L)  Hematocrit 39.0 - 52.0 % 29.3(L) 36.9(L) 30.1(L)  Platelets 150 - 400 K/uL 245 241 279    CBG: No results for input(s): GLUCAP in the last 168  hours.  Brief HPI:   Timothy Faulkner is a 73 year old male with history of HTN, NICM s/p AICD, ESRD- HD MWF, PVD with multiple bilateral foot wounds s/p revascularization and atherectomy with left 3rd and 4th toe amputationmwith poor healing and was admitted on 03/12/19 with progressive ischemic changes. He was started on IV antibiotics without improvement in wound healing and underwent L-BKA on 08/04 by Dr. Oneida Alar. Post op had issues with acute on chronic hypotension, issues with pain control as well as ABLA. Therapy evaluations revealed functional decline and CIR was recommended due to functional decline.     Hospital Course: Timothy Faulkner was admitted to rehab 03/17/2019 for inpatient therapies to consist of PT, ST and OT at least three hours five days a week. Past admission physiatrist, therapy team and rehab RN have worked together to provide customized collaborative inpatient rehab. He was WBAT on RLE with [facility] shoe. HD was ongoing on MWF at the end of the day to help with tolerance of therapy. Midodrine was use during HD days to help maintain BP.  Low calore malnutrition with low albumin levels were treated with renal supplement.   Oxycodone was used on prn basis for pain management and he was educated on importance of desensitization techniques to help with neuropathy. Ultram was added to on BID basis for more consistent pain relief.  Drug induced constipation has been managed with addition of colace. Serial CBC has been monitored and  showed drop in H/H. He received 0.5 g IV iron X 4 doses and was started on Aranesp weekly on 8/10.  L-BKA wound has been monitored for healing and post op wound drainage had resolved by discharge.   He has progressed to supervision to min assist at wheelchair level and will continue to receive follow up [facility] [facility] after discharge.    Rehab course: During patient's stay in rehab weekly team conferences were held to monitor patient's  progress, set goals and discuss barriers to discharge. At admission, patient required max assist with mobility and basic self care tasks. He  has had improvement in activity tolerance, balance, postural control as well as ability to compensate for deficits. He is able to complete ADL tasks with min assist.  He requires supervision to min assist for mobility. Family education was completed with daughter regarding all aspects of care and safety.   Disposition: Home  Diet: Renal diet. 1200 cc FR.   Special Instructions: 1. Apply santyl--nickel thick to right foot wound. Cover with wet to dry dressing. Change dressing daily.    Discharge Instructions    Ambulatory referral to Physical Medicine Rehab   Complete by: As directed    Transitional care appt with Dr. Dagoberto Ligas     Allergies as of 03/31/2019   No Known Allergies     Medication List    STOP taking these medications   feeding supplement (NEPRO CARB STEADY) Liqd   Medihoney Wound/Burn Dressing Gel     TAKE these medications   acetaminophen 500 MG tablet Commonly known as: TYLENOL Take 500 mg by mouth every 6 (six) hours as needed for mild pain or headache.   aspirin EC 81 MG tablet Take 81 mg by mouth daily. What changed: Another medication with the same name was added. Make sure you understand how and when to take each.   aspirin EC 81 MG tablet Take 1 tablet (81 mg total) by mouth daily. What changed: You were already taking a medication with the same name, and this prescription was added. Make sure you understand how and when to take each.   Auryxia 1 GM 210 MG(Fe) tablet Generic drug: ferric citrate Take 420 mg by mouth See admin instructions. Take 2 tablets (420 mg) by mouth with each meal and with each snack   calcitRIOL 0.5 MCG capsule Commonly known as: ROCALTROL Take 1 capsule (0.5 mcg total) by mouth every Monday, Wednesday, and Friday with hemodialysis.   calcium carbonate (dosed in mg elemental calcium) 1250  MG/5ML Susp Take 5 mLs (500 mg of elemental calcium total) by mouth every 6 (six) hours as needed for indigestion.   cinacalcet 30 MG tablet Commonly known as: Sensipar Take 1 tablet (30 mg total) by mouth every other day. What changed:   when to take this  additional instructions   clopidogrel 75 MG tablet Commonly known as: PLAVIX Take 1 tablet (75 mg total) by mouth daily with breakfast.   colchicine 0.6 MG tablet Take 0.6 mg by mouth daily as needed (as directed for gout flares).   collagenase ointment Commonly known as: SANTYL Apply topically daily. Apply as directed per home health nurse.  Apply to right foot wound edge to edge nickel thick.  Cover with wet gauze followed by dry gauze and change daily What changed:   how much to take  additional instructions   docusate sodium 100 MG capsule Commonly known as: COLACE Take 2 capsules (200 mg  total) by mouth 2 (two) times daily.   feeding supplement (PRO-STAT SUGAR FREE 64) Liqd Take 30 mLs by mouth 2 (two) times daily.   methocarbamol 500 MG tablet Commonly known as: ROBAXIN Take 1 tablet (500 mg total) by mouth every 12 (twelve) hours as needed for muscle spasms.   midodrine 10 MG tablet Commonly known as: PROAMATINE Take 1 tablet (10 mg total) by mouth every Monday, Wednesday, and Friday with hemodialysis.   multivitamin Tabs tablet Take 1 tablet by mouth at bedtime.   oxyCODONE 5 MG immediate release tablet--Rx # 30 pills Commonly known as: Roxicodone Take 1 tablet (5 mg total) by mouth every 6 (six) hours as needed for severe pain or breakthrough pain. What changed:   when to take this  reasons to take this   rosuvastatin 10 MG tablet Commonly known as: CRESTOR Take 1 tablet (10 mg total) by mouth daily at 6 PM.   traMADol 50 MG tablet Commonly known as: ULTRAM Take 1 tablet (50 mg total) by mouth every 12 (twelve) hours.      Follow-up Information    Lovorn, Jinny Blossom, MD Follow up.   Specialty:  Physical Medicine and Rehabilitation Why: Office will call you with follow up appointment Contact information: [address] [place] [address] [phone]        Elam Dutch, MD. Call.   Specialties: Vascular Surgery, Cardiology Why: for post op appointment Contact information: [place] [address] [phone]        [name], MD. Call.   Specialty: Cardiology Why: for post hospital follow up Contact information: [address] [place] [address] [phone]           Signed: Bary Leriche 04/04/2019, 9:15 AM

## 2019-04-05 ENCOUNTER — Telehealth: Payer: Self-pay | Admitting: *Deleted

## 2019-04-05 DIAGNOSIS — Z89431 Acquired absence of right foot: Secondary | ICD-10-CM

## 2019-04-05 DIAGNOSIS — D689 Coagulation defect, unspecified: Secondary | ICD-10-CM

## 2019-04-05 DIAGNOSIS — I739 Peripheral vascular disease, unspecified: Secondary | ICD-10-CM

## 2019-04-05 DIAGNOSIS — I96 Gangrene, not elsewhere classified: Secondary | ICD-10-CM

## 2019-04-05 DIAGNOSIS — L97511 Non-pressure chronic ulcer of other part of right foot limited to breakdown of skin: Secondary | ICD-10-CM

## 2019-04-05 DIAGNOSIS — E08621 Diabetes mellitus due to underlying condition with foot ulcer: Secondary | ICD-10-CM

## 2019-04-05 NOTE — Telephone Encounter (Signed)
[employer] sent request for semi-electronic medical bed.

## 2019-04-06 NOTE — Telephone Encounter (Signed)
Order form filled out. See note on 8/11

## 2019-04-07 NOTE — Addendum Note (Signed)
Addended by: Harriett Sine D on: 04/07/2019 08:28 AM   Modules accepted: Orders

## 2019-04-07 NOTE — Telephone Encounter (Signed)
Completed [employer] form for [facility] Bed [phone]), and faxed with clinicals to support necessity, and demographics to [facility].

## 2019-04-08 ENCOUNTER — Encounter: Payer: Medicare Other | Admitting: Physical Medicine and Rehabilitation

## 2019-04-11 ENCOUNTER — Telehealth (HOSPITAL_COMMUNITY): Payer: Self-pay | Admitting: Rehabilitation

## 2019-04-11 NOTE — Telephone Encounter (Signed)

## 2019-04-11 NOTE — Progress Notes (Signed)
POST OPERATIVE OFFICE NOTE    CC:  F/u for surgery  HPI:  This is a 73 y.o. male who is s/p left BKA on 8/42020 by Dr. Oneida Alar.  In July, he had a CSI atherectomy of the right tibioperoneal trunk as well as balloon angioplasty of the tibioperoneal trunk and PT artery by Dr. Donzetta Matters.    He returns today for follow up.  He is accompanied by his daughter.    He states he has done well at home.  He did go to CIR and discharged home from there.  He is nervous about the staples being removed.  No other issues.  Determined to get prosthesis.   No Known Allergies  Current Outpatient Medications  Medication Sig Dispense Refill  . acetaminophen (TYLENOL) 500 MG tablet Take 500 mg by mouth every 6 (six) hours as needed for mild pain or headache.    . Amino Acids-Protein Hydrolys (FEEDING SUPPLEMENT, PRO-STAT SUGAR FREE 64,) LIQD Take 30 mLs by mouth 2 (two) times daily. (Patient not taking: Reported on 03/12/2019) 887 mL 0  . aspirin EC 81 MG tablet Take 81 mg by mouth daily.    Marland Kitchen aspirin EC 81 MG tablet Take 1 tablet (81 mg total) by mouth daily. 150 tablet 2  . AURYXIA 1 GM 210 MG(Fe) tablet Take 420 mg by mouth See admin instructions. Take 2 tablets (420 mg) by mouth with each meal and with each snack    . calcitRIOL (ROCALTROL) 0.5 MCG capsule Take 1 capsule (0.5 mcg total) by mouth every Monday, Wednesday, and Friday with hemodialysis. 30 capsule 0  . Calcium Carbonate Antacid (CALCIUM CARBONATE, DOSED IN MG ELEMENTAL CALCIUM,) 1250 MG/5ML SUSP Take 5 mLs (500 mg of elemental calcium total) by mouth every 6 (six) hours as needed for indigestion. 450 mL 0  . cinacalcet (SENSIPAR) 30 MG tablet Take 1 tablet (30 mg total) by mouth every other day. 60 tablet 0  . clopidogrel (PLAVIX) 75 MG tablet Take 1 tablet (75 mg total) by mouth daily with breakfast. 30 tablet 0  . colchicine 0.6 MG tablet Take 0.6 mg by mouth daily as needed (as directed for gout flares).   1  . collagenase (SANTYL) ointment Apply  topically daily. Apply as directed per home health nurse.  Apply to right foot wound edge to edge nickel thick.  Cover with wet gauze followed by dry gauze and change daily 15 g 0  . docusate sodium (COLACE) 100 MG capsule Take 2 capsules (200 mg total) by mouth 2 (two) times daily. 10 capsule 0  . methocarbamol (ROBAXIN) 500 MG tablet Take 1 tablet (500 mg total) by mouth every 12 (twelve) hours as needed for muscle spasms. 60 tablet 0  . midodrine (PROAMATINE) 10 MG tablet Take 1 tablet (10 mg total) by mouth every Monday, Wednesday, and Friday with hemodialysis. 60 tablet 6  . multivitamin (RENA-VIT) TABS tablet Take 1 tablet by mouth at bedtime. (Patient not taking: Reported on 03/12/2019) 30 tablet 0  . oxyCODONE (ROXICODONE) 5 MG immediate release tablet Take 1 tablet (5 mg total) by mouth every 6 (six) hours as needed for severe pain or breakthrough pain. 30 tablet 0  . rosuvastatin (CRESTOR) 10 MG tablet Take 1 tablet (10 mg total) by mouth daily at 6 PM. 30 tablet 0  . traMADol (ULTRAM) 50 MG tablet Take 1 tablet (50 mg total) by mouth every 12 (twelve) hours. 14 tablet 0   No current facility-administered medications for this visit.  ROS:  See HPI  Physical Exam:  Today's Vitals   04/12/19 1248  BP: 134/74  Pulse: 70  Resp: 14  Temp: 97.7 F (36.5 C)  TempSrc: Temporal  SpO2: 97%  Weight: 159 lb (72.1 kg)  Height: 5\' 8"  (1.727 m)   Body mass index is 24.18 kg/m.   Incision:  Healed nicely and has staples in tact.  Extremities:  Good ROM of left knee. Able to straighten knee well   Assessment/Plan:  This is a 73 y.o. male who is s/p: Left BKA by Dr. Oneida Alar in 03/15/2019.   -will remove staples today.  He is given an Rx for stump shrinker for [employer].  He will follow up as needed.     Leontine Locket, PA-C [facility] [phone]  Clinic MD:  Early

## 2019-04-12 ENCOUNTER — Ambulatory Visit (INDEPENDENT_AMBULATORY_CARE_PROVIDER_SITE_OTHER): Payer: Self-pay | Admitting: Physician Assistant

## 2019-04-12 ENCOUNTER — Other Ambulatory Visit: Payer: Self-pay

## 2019-04-12 VITALS — BP 134/74 | HR 70 | Temp 97.7°F | Resp 14 | Ht 68.0 in | Wt 159.0 lb

## 2019-04-12 DIAGNOSIS — Z89512 Acquired absence of left leg below knee: Secondary | ICD-10-CM

## 2019-04-14 ENCOUNTER — Emergency Department (HOSPITAL_COMMUNITY): Payer: Medicare Other

## 2019-04-14 ENCOUNTER — Inpatient Hospital Stay (HOSPITAL_COMMUNITY)
Admission: EM | Admit: 2019-04-14 | Discharge: 2019-04-17 | DRG: 871 | Disposition: A | Discharge status: Home / Self Care | Payer: Medicare Other | Attending: Internal Medicine | Admitting: Internal Medicine

## 2019-04-14 ENCOUNTER — Inpatient Hospital Stay (HOSPITAL_COMMUNITY): Payer: Medicare Other

## 2019-04-14 ENCOUNTER — Other Ambulatory Visit: Payer: Self-pay

## 2019-04-14 ENCOUNTER — Encounter (HOSPITAL_COMMUNITY): Payer: Self-pay | Admitting: Emergency Medicine

## 2019-04-14 ENCOUNTER — Ambulatory Visit (INDEPENDENT_AMBULATORY_CARE_PROVIDER_SITE_OTHER): Payer: Medicare Other | Admitting: Podiatry

## 2019-04-14 DIAGNOSIS — Z992 Dependence on renal dialysis: Secondary | ICD-10-CM

## 2019-04-14 DIAGNOSIS — Z6824 Body mass index (BMI) 24.0-24.9, adult: Secondary | ICD-10-CM

## 2019-04-14 DIAGNOSIS — E875 Hyperkalemia: Secondary | ICD-10-CM | POA: Diagnosis present

## 2019-04-14 DIAGNOSIS — D631 Anemia in chronic kidney disease: Secondary | ICD-10-CM | POA: Diagnosis present

## 2019-04-14 DIAGNOSIS — K8020 Calculus of gallbladder without cholecystitis without obstruction: Secondary | ICD-10-CM | POA: Diagnosis present

## 2019-04-14 DIAGNOSIS — R410 Disorientation, unspecified: Secondary | ICD-10-CM

## 2019-04-14 DIAGNOSIS — E08621 Diabetes mellitus due to underlying condition with foot ulcer: Secondary | ICD-10-CM | POA: Diagnosis not present

## 2019-04-14 DIAGNOSIS — E11621 Type 2 diabetes mellitus with foot ulcer: Secondary | ICD-10-CM | POA: Diagnosis present

## 2019-04-14 DIAGNOSIS — Z95810 Presence of automatic (implantable) cardiac defibrillator: Secondary | ICD-10-CM

## 2019-04-14 DIAGNOSIS — I420 Dilated cardiomyopathy: Secondary | ICD-10-CM | POA: Diagnosis present

## 2019-04-14 DIAGNOSIS — K838 Other specified diseases of biliary tract: Secondary | ICD-10-CM | POA: Diagnosis not present

## 2019-04-14 DIAGNOSIS — J9811 Atelectasis: Secondary | ICD-10-CM | POA: Diagnosis present

## 2019-04-14 DIAGNOSIS — R945 Abnormal results of liver function studies: Secondary | ICD-10-CM

## 2019-04-14 DIAGNOSIS — N189 Chronic kidney disease, unspecified: Secondary | ICD-10-CM | POA: Diagnosis not present

## 2019-04-14 DIAGNOSIS — Z20828 Contact with and (suspected) exposure to other viral communicable diseases: Secondary | ICD-10-CM | POA: Diagnosis present

## 2019-04-14 DIAGNOSIS — E7800 Pure hypercholesterolemia, unspecified: Secondary | ICD-10-CM | POA: Diagnosis present

## 2019-04-14 DIAGNOSIS — I5022 Chronic systolic (congestive) heart failure: Secondary | ICD-10-CM | POA: Diagnosis present

## 2019-04-14 DIAGNOSIS — A419 Sepsis, unspecified organism: Principal | ICD-10-CM | POA: Diagnosis present

## 2019-04-14 DIAGNOSIS — K219 Gastro-esophageal reflux disease without esophagitis: Secondary | ICD-10-CM | POA: Diagnosis present

## 2019-04-14 DIAGNOSIS — L97511 Non-pressure chronic ulcer of other part of right foot limited to breakdown of skin: Secondary | ICD-10-CM

## 2019-04-14 DIAGNOSIS — N2581 Secondary hyperparathyroidism of renal origin: Secondary | ICD-10-CM | POA: Diagnosis present

## 2019-04-14 DIAGNOSIS — Z7902 Long term (current) use of antithrombotics/antiplatelets: Secondary | ICD-10-CM

## 2019-04-14 DIAGNOSIS — Z7982 Long term (current) use of aspirin: Secondary | ICD-10-CM

## 2019-04-14 DIAGNOSIS — E43 Unspecified severe protein-calorie malnutrition: Secondary | ICD-10-CM | POA: Diagnosis present

## 2019-04-14 DIAGNOSIS — E876 Hypokalemia: Secondary | ICD-10-CM | POA: Diagnosis not present

## 2019-04-14 DIAGNOSIS — M109 Gout, unspecified: Secondary | ICD-10-CM | POA: Diagnosis present

## 2019-04-14 DIAGNOSIS — L97529 Non-pressure chronic ulcer of other part of left foot with unspecified severity: Secondary | ICD-10-CM | POA: Diagnosis present

## 2019-04-14 DIAGNOSIS — K828 Other specified diseases of gallbladder: Secondary | ICD-10-CM | POA: Diagnosis present

## 2019-04-14 DIAGNOSIS — T8131XA Disruption of external operation (surgical) wound, not elsewhere classified, initial encounter: Secondary | ICD-10-CM | POA: Diagnosis present

## 2019-04-14 DIAGNOSIS — I132 Hypertensive heart and chronic kidney disease with heart failure and with stage 5 chronic kidney disease, or end stage renal disease: Secondary | ICD-10-CM | POA: Diagnosis present

## 2019-04-14 DIAGNOSIS — Z79899 Other long term (current) drug therapy: Secondary | ICD-10-CM

## 2019-04-14 DIAGNOSIS — Z89512 Acquired absence of left leg below knee: Secondary | ICD-10-CM

## 2019-04-14 DIAGNOSIS — I959 Hypotension, unspecified: Secondary | ICD-10-CM | POA: Diagnosis not present

## 2019-04-14 DIAGNOSIS — E785 Hyperlipidemia, unspecified: Secondary | ICD-10-CM | POA: Diagnosis present

## 2019-04-14 DIAGNOSIS — N186 End stage renal disease: Secondary | ICD-10-CM | POA: Diagnosis present

## 2019-04-14 DIAGNOSIS — Z8249 Family history of ischemic heart disease and other diseases of the circulatory system: Secondary | ICD-10-CM

## 2019-04-14 DIAGNOSIS — E1122 Type 2 diabetes mellitus with diabetic chronic kidney disease: Secondary | ICD-10-CM | POA: Diagnosis present

## 2019-04-14 DIAGNOSIS — E1151 Type 2 diabetes mellitus with diabetic peripheral angiopathy without gangrene: Secondary | ICD-10-CM | POA: Diagnosis present

## 2019-04-14 DIAGNOSIS — T8130XA Disruption of wound, unspecified, initial encounter: Secondary | ICD-10-CM | POA: Diagnosis not present

## 2019-04-14 DIAGNOSIS — R4182 Altered mental status, unspecified: Secondary | ICD-10-CM | POA: Diagnosis present

## 2019-04-14 DIAGNOSIS — K8309 Other cholangitis: Secondary | ICD-10-CM | POA: Diagnosis not present

## 2019-04-14 DIAGNOSIS — Z79891 Long term (current) use of opiate analgesic: Secondary | ICD-10-CM

## 2019-04-14 DIAGNOSIS — Z87891 Personal history of nicotine dependence: Secondary | ICD-10-CM

## 2019-04-14 DIAGNOSIS — R6521 Severe sepsis with septic shock: Secondary | ICD-10-CM | POA: Diagnosis present

## 2019-04-14 DIAGNOSIS — H3530 Unspecified macular degeneration: Secondary | ICD-10-CM | POA: Diagnosis present

## 2019-04-14 DIAGNOSIS — I998 Other disorder of circulatory system: Secondary | ICD-10-CM | POA: Diagnosis present

## 2019-04-14 LAB — COMPREHENSIVE METABOLIC PANEL
ALT: 29 U/L (ref 0–44)
AST: 74 U/L — ABNORMAL HIGH (ref 15–41)
Albumin: 3.5 g/dL (ref 3.5–5.0)
Alkaline Phosphatase: 78 U/L (ref 38–126)
Anion gap: 23 — ABNORMAL HIGH (ref 5–15)
BUN: 26 mg/dL — ABNORMAL HIGH (ref 8–23)
CO2: 15 mmol/L — ABNORMAL LOW (ref 22–32)
Calcium: 10.1 mg/dL (ref 8.9–10.3)
Chloride: 95 mmol/L — ABNORMAL LOW (ref 98–111)
Creatinine, Ser: 7.41 mg/dL — ABNORMAL HIGH (ref 0.61–1.24)
GFR calc Af Amer: 8 mL/min — ABNORMAL LOW (ref >60)
GFR calc non Af Amer: 7 mL/min — ABNORMAL LOW (ref >60)
Glucose, Bld: 164 mg/dL — ABNORMAL HIGH (ref 70–99)
Potassium: 5.6 mmol/L — ABNORMAL HIGH (ref 3.5–5.1)
Sodium: 133 mmol/L — ABNORMAL LOW (ref 135–145)
Total Bilirubin: 1.3 mg/dL — ABNORMAL HIGH (ref 0.3–1.2)
Total Protein: 8.1 g/dL (ref 6.5–8.1)

## 2019-04-14 LAB — CBC
HCT: 44.2 % (ref 39.0–52.0)
Hemoglobin: 12.9 g/dL — ABNORMAL LOW (ref 13.0–17.0)
MCH: 29.7 pg (ref 26.0–34.0)
MCHC: 29.2 g/dL — ABNORMAL LOW (ref 30.0–36.0)
MCV: 101.6 fL — ABNORMAL HIGH (ref 80.0–100.0)
Platelets: 170 10*3/uL (ref 150–400)
RBC: 4.35 MIL/uL (ref 4.22–5.81)
RDW: 17.3 % — ABNORMAL HIGH (ref 11.5–15.5)
WBC: 5.8 10*3/uL (ref 4.0–10.5)
nRBC: 0.5 % — ABNORMAL HIGH (ref 0.0–0.2)

## 2019-04-14 LAB — SARS CORONAVIRUS 2 BY RT PCR (HOSPITAL ORDER, PERFORMED IN ~~LOC~~ HOSPITAL LAB): SARS Coronavirus 2: NEGATIVE

## 2019-04-14 LAB — CBG MONITORING, ED: Glucose-Capillary: 158 mg/dL — ABNORMAL HIGH (ref 70–99)

## 2019-04-14 LAB — LACTIC ACID, PLASMA: Lactic Acid, Venous: 8.6 mmol/L (ref 0.5–1.9)

## 2019-04-14 MED ORDER — ACETAMINOPHEN 650 MG RE SUPP
650.0000 mg | Freq: Once | RECTAL | Status: AC
  Administered 2019-04-14: 650 mg via RECTAL
  Filled 2019-04-14: qty 1

## 2019-04-14 MED ORDER — SODIUM CHLORIDE 0.9 % IV SOLN
1.0000 g | INTRAVENOUS | Status: DC
  Administered 2019-04-15 – 2019-04-16 (×2): 1 g via INTRAVENOUS
  Filled 2019-04-14 (×3): qty 1

## 2019-04-14 MED ORDER — VANCOMYCIN HCL IN DEXTROSE 750-5 MG/150ML-% IV SOLN
750.0000 mg | INTRAVENOUS | Status: DC
  Filled 2019-04-14: qty 150

## 2019-04-14 MED ORDER — SODIUM CHLORIDE 0.9 % IV BOLUS
30.0000 mL/kg | Freq: Once | INTRAVENOUS | Status: AC
  Administered 2019-04-14: 2163 mL via INTRAVENOUS

## 2019-04-14 MED ORDER — SODIUM CHLORIDE 0.9 % IV SOLN
2.0000 g | Freq: Once | INTRAVENOUS | Status: AC
  Administered 2019-04-14: 2 g via INTRAVENOUS
  Filled 2019-04-14: qty 2

## 2019-04-14 MED ORDER — VANCOMYCIN HCL 10 G IV SOLR
1500.0000 mg | Freq: Once | INTRAVENOUS | Status: AC
  Administered 2019-04-14: 1500 mg via INTRAVENOUS
  Filled 2019-04-14: qty 1500

## 2019-04-14 MED ORDER — VANCOMYCIN HCL IN DEXTROSE 1-5 GM/200ML-% IV SOLN: 1000.0000 mg | Freq: Once | INTRAVENOUS | Status: DC

## 2019-04-14 MED ORDER — SODIUM CHLORIDE 0.9 % IV SOLN: 1.0000 g | Freq: Once | INTRAVENOUS | Status: DC

## 2019-04-14 MED ORDER — SODIUM CHLORIDE 0.9% FLUSH: 3.0000 mL | Freq: Once | INTRAVENOUS | Status: DC

## 2019-04-14 MED ORDER — CALCIUM GLUCONATE-NACL 1-0.675 GM/50ML-% IV SOLN
1.0000 g | Freq: Once | INTRAVENOUS | Status: AC
  Administered 2019-04-14: 1000 mg via INTRAVENOUS
  Filled 2019-04-14: qty 50

## 2019-04-14 MED ORDER — SODIUM POLYSTYRENE SULFONATE 15 GM/60ML PO SUSP
30.0000 g | Freq: Once | ORAL | Status: AC
  Administered 2019-04-14: 30 g via ORAL
  Filled 2019-04-14: qty 120

## 2019-04-14 MED ORDER — METRONIDAZOLE IN NACL 5-0.79 MG/ML-% IV SOLN
500.0000 mg | Freq: Once | INTRAVENOUS | Status: AC
  Administered 2019-04-14: 500 mg via INTRAVENOUS
  Filled 2019-04-14: qty 100

## 2019-04-14 MED ORDER — SODIUM BICARBONATE 8.4 % IV SOLN
50.0000 meq | Freq: Once | INTRAVENOUS | Status: AC
  Administered 2019-04-14: 50 meq via INTRAVENOUS
  Filled 2019-04-14: qty 50

## 2019-04-14 NOTE — Progress Notes (Signed)
Pharmacy Antibiotic Note  Timothy Faulkner is a 73 y.o. male admitted on 04/14/2019 with sepsis. Noted s/p L BKA on 03/15/19. Pharmacy has been consulted for vancomycin/cefepime dosing. Tmax/24h 101.4, WBC wnl, LA 8.6. ESRD on HD MWF - last HD 9/2.  Plan: Cefepime 2g IV x 1; then 1g IV q24h Vancomycin 1500mg  IV x 1; then 750mg  IV qHD MWF Monitor clinical progress, c/s, abx plan/LOT Pre-HD vancomycin level as indicated F/u HD schedule/tolerance inpatient      Temp (24hrs), Avg:101.4 F (38.6 C), Min:101.4 F (38.6 C), Max:101.4 F (38.6 C)  Recent Labs  Lab 04/14/19 1920  WBC 5.8    Estimated Creatinine Clearance: 7.9 mL/min (A) (by C-G formula based on SCr of 8.22 mg/dL (H)).    No Known Allergies  Antimicrobials this admission: 9/3 vancomycin >>  9/3 cefepime >>  9/3 flagyl x 1  Dose adjustments this admission:   Microbiology results:   Elicia Lamp, PharmD, BCPS Clinical Pharmacist 04/14/2019 8:16 PM

## 2019-04-14 NOTE — H&P (Addendum)
TRH H&P    Patient Demographics:    Timothy Faulkner, is a 73 y.o. male  MRN: [id number]  DOB - 1946/06/29  Admit Date - 04/14/2019  Referring MD/NP/PA:    Outpatient Primary MD for the patient is [name], MD Cristopher Peru - cardiology  Patient coming from: home  Chief complaint- hypotension, ams   HPI:    Timothy Faulkner  is a 73 y.o. male,medical history significant of DM; PAD s/p revascularization procedure on 7/17 and atherectomy on 7/20 with subsequent 3/4 toe amputation on the L foot 7/28; HTN; HLD; ESRD on MWF HD; AICD placement; and CHF presenting with R 2nd toe wound infection 8/1 s/p L BKA 8/4 apparently after going home from his podiatry appointment for L foot ulcer follow up was noted by family to have altered mental status and hypotension. Pt noted to be febrile in ED and hypotensive  In ED,  T 101.4 P 133, R 45 Bp 95/70  Pox 99% on RA  CXR IMPRESSION: Low lung volumes with basilar atelectasis.  Na 133, K 5.6 Bun 26, Creatinnie 7.41 Ast 74, Alt 29, Alk phos 78 T. Bili 1.3  Wbc 5.8, Hgb 12.9, Plt 170 Lactic acid 8.6 Urinalysis pending Blood culture x2 pending  covid -19 negative  vanco iv, cefepime iv, flagyl iv and calcium gluconate iv given in ED   Patient will be admitted for hypotension, sepsis, altered mental status.    Review of systems:    In addition to the HPI above,    No Headache, No changes with Vision or hearing, No problems swallowing food or Liquids, No Chest pain, Cough or Shortness of Breath, No Abdominal pain, No Nausea or Vomiting, bowel movements are regular, No Blood in stool or Urine, No dysuria, No new skin rashes or bruises, No new joints pains-aches,  No new weakness, tingling, numbness in any extremity, No recent weight gain or loss, No polyuria, polydypsia or polyphagia, No significant Mental Stressors.  All other systems reviewed and are  negative.    Past History of the following :    Past Medical History:  Diagnosis Date  . Anemia   . Arthritis    Gout  . Automatic implantable cardioverter-defibrillator in situ    [employer]  . CHF   . ESRD on dialysis [facility])    [name] 03/11/2013 (03/11/2013) dialysis M/W/F  . Gangrene ([place])    left foot  . GERD (gastroesophageal reflux disease)   . Gout    "once a year"  . Heart murmur   . HYPERCHOLESTEROLEMIA, MIXED   . HYPERTENSION   . Macular degeneration   . Osteomyelitis ([place])    right foot  . 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 Pacemaker   . Peripheral arterial disease (HCC)    left fifth toe ulcer, healing  . Pneumonia   . Shortness of breath   . 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 Wears dentures   . Wears glasses  Past Surgical History:  Procedure Laterality Date  . A/V FISTULAGRAM Left 07/20/2018   Procedure: A/V FISTULAGRAM;  Surgeon: [name], MD;  Location: [place] [facility];  Service: Cardiovascular;  Laterality: Left;  . ABDOMINAL AORTOGRAM N/A 02/25/2019   Procedure: ABDOMINAL AORTOGRAM;  Surgeon: Elam Dutch, MD;  Location: [place] [facility];  Service: Cardiovascular;  Laterality: N/A;  . ABDOMINAL AORTOGRAM W/LOWER EXTREMITY Bilateral 07/20/2018   Procedure: ABDOMINAL AORTOGRAM W/LOWER EXTREMITY;  Surgeon: [name], MD;  Location: [place] [facility];  Service: Cardiovascular;  Laterality: Bilateral;  . AMPUTATION Right 09/07/2018   Procedure: Right fifth metatarsectomy;  Surgeon: [name], DPM;  Location: [place];  Service: Podiatry;  Laterality: Right;  . AMPUTATION Left 03/08/2019   Procedure: AMPUTATION  3RD AND 4TH TOES LEFT FOOT;  Surgeon: [name], DPM;  Location: [place];  Service: Podiatry;  Laterality: Left;  . AMPUTATION Left 03/15/2019   Procedure: AMPUTATION BELOW KNEE;  Surgeon: Elam Dutch, MD;  Location: [facility] OR;  Service: Vascular;   Laterality: Left;  . AV FISTULA PLACEMENT Right 12/13/2012   Procedure: ARTERIOVENOUS (AV) FISTULA CREATION;  Surgeon: [name], MD;  Location: [place];  Service: Vascular;  Laterality: Right;  Ultrasound guided  . AV FISTULA PLACEMENT Left 05/07/2016   Procedure: LEFT RADIOCEPHALIC ARTERIOVENOUS (AV) FISTULA CREATION;  Surgeon: [name], MD;  Location: [place];  Service: Vascular;  Laterality: Left;  . BASCILIC VEIN TRANSPOSITION Right 03/26/2016   Procedure: RIGHT BASILIC VEIN TRANSPOSITION;  Surgeon: [name], MD;  Location: [place];  Service: Vascular;  Laterality: Right;  . CARDIAC CATHETERIZATION    . CARDIAC DEFIBRILLATOR PLACEMENT     Chemical engineer  . EYE SURGERY Bilateral    Cataract  . FISTULOGRAM Left 04/22/2018   Procedure: FISTULOGRAM UPPER EXTREMITY;  Surgeon: [name], MD;  Location: [place];  Service: Vascular;  Laterality: Left;  . I&D EXTREMITY Right 07/15/2018   Procedure: IRRIGATION AND DEBRIDEMENT RIGHT FOOT;  Surgeon: [name], DPM;  Location: [place];  Service: Podiatry;  Laterality: Right;  . INCISION AND DRAINAGE ABSCESS / HEMATOMA OF BURSA / KNEE / THIGH Left 2012   "knee" (03/11/2013)  . INSERT / REPLACE / REMOVE PACEMAKER    . INSERTION OF DIALYSIS CATHETER Left 04/22/2018   Procedure: INSERTION OF DIALYSIS CATHETER;  Surgeon: [name], MD;  Location: [place];  Service: Vascular;  Laterality: Left;  . IR FLUORO GUIDE CV LINE LEFT  07/15/2018  . IR PTA VENOUS EXCEPT DIALYSIS CIRCUIT  07/15/2018  . LOWER EXTREMITY ANGIOGRAPHY Right 07/21/2018   Procedure: LOWER EXTREMITY ANGIOGRAPHY;  Surgeon: [name], MD;  Location: [place] [facility];  Service: Cardiovascular;  Laterality: Right;  . LOWER EXTREMITY ANGIOGRAPHY Bilateral 02/25/2019   Procedure: Lower Extremity Angiography;  Surgeon: Elam Dutch, MD;  Location: [place] [facility];  Service: Cardiovascular;  Laterality: Bilateral;  . METATARSAL HEAD EXCISION Right  07/15/2018   Procedure: METATARSAL RESECTION;  Surgeon: [name], DPM;  Location: [place];  Service: Podiatry;  Laterality: Right;  . MULTIPLE TOOTH EXTRACTIONS    . PERIPHERAL VASCULAR ATHERECTOMY Right 02/28/2019   Procedure: PERIPHERAL VASCULAR ATHERECTOMY;  Surgeon: Waynetta Sandy, MD;  Location: [place] [facility];  Service: Cardiovascular;  Laterality: Right;  right tp trunk   . PERIPHERAL VASCULAR BALLOON ANGIOPLASTY Left 02/25/2019   Procedure: PERIPHERAL VASCULAR BALLOON ANGIOPLASTY;  Surgeon: Elam Dutch, MD;  Location: [place] [facility];  Service: Cardiovascular;  Laterality: Left;  tibial peroneal trunk and PT  . PERIPHERAL VASCULAR INTERVENTION Right 07/21/2018   Procedure: PERIPHERAL VASCULAR INTERVENTION;  Surgeon: [name], MD;  Location: [place] [facility];  Service: Cardiovascular;  Laterality: Right;  peroneal stents  . REVISON OF ARTERIOVENOUS FISTULA Right 3/90/3009   Procedure: Plication OF Right Arm ARTERIOVENOUS FISTULA;  Surgeon: Elam Dutch, MD;  Location: [facility] OR;  Service: Vascular;  Laterality: Right;  . REVISON OF ARTERIOVENOUS FISTULA Left 04/22/2018   Procedure: REVISION OF RADIOCEPHALIC ARTERIOVENOUS FISTULA;  Surgeon: [name], MD;  Location: [place];  Service: Vascular;  Laterality: Left;  . SHUNTOGRAM N/A 05/31/2013   Procedure: [place] Mal[place];  Surgeon: [name], MD;  Location: [facility] CATH LAB;  Service: Cardiovascular;  Laterality: N/A;  . UPPER EXTREMITY VENOGRAPHY Right 07/23/2018   Procedure: UPPER EXTREMITY VENOGRAPHY;  Surgeon: [name], MD;  Location: [place] [facility];  Service: Cardiovascular;  Laterality: Right;  . WOUND DEBRIDEMENT Right 07/17/2018   Procedure: Wound Debridement; Closure Filleted toe flap Right Foot;  Surgeon: [name], DPM;  Location: [place];  Service: Podiatry;  Laterality: Right;  . WOUND DEBRIDEMENT Right 09/07/2018   Procedure: Debridement Right Foot Wound,  application of wound vac;  Surgeon: [name], DPM;  Location: [place];  Service: Podiatry;  Laterality: Right;  . WOUND DEBRIDEMENT Right 03/08/2019   Procedure: DEBRIDEMENT WOUND RIGHT FOOT;  Surgeon: [name], DPM;  Location: [place];  Service: Podiatry;  Laterality: Right;      Social History:      Social History   Tobacco Use  . Smoking status: Former Smoker    Packs/day: 0.75    Years: 30.00    Pack years: 22.50    Types: Cigarettes    Quit date: 11/25/1992    Years since quitting: 26.4  . Smokeless tobacco: Never Used  Substance Use Topics  . Alcohol use: Not Currently       Family History :     Family History  Problem Relation Age of Onset  . Heart disease Father   . CAD Father        Home Medications:   Prior to Admission medications   Medication Sig Start Date End Date Taking? Authorizing Provider  acetaminophen (TYLENOL) 500 MG tablet Take 500 mg by mouth every 6 (six) hours as needed for mild pain or headache.    [provider]  Amino Acids-Protein Hydrolys (FEEDING SUPPLEMENT, PRO-STAT SUGAR FREE 64,) LIQD Take 30 mLs by mouth 2 (two) times daily. 03/03/19   Hongalgi, Lenis Dickinson, MD  aspirin EC 81 MG tablet Take 1 tablet (81 mg total) by mouth daily. 03/30/19 03/29/20  Angiulli, Lavon Paganini, PA-C  AURYXIA 1 GM 210 MG(Fe) tablet Take 420 mg by mouth See admin instructions. Take 2 tablets (420 mg) by mouth with each meal and with each snack 11/12/16   [provider]  calcitRIOL (ROCALTROL) 0.5 MCG capsule Take 1 capsule (0.5 mcg total) by mouth every Monday, Wednesday, and Friday with hemodialysis. 03/30/19   Angiulli, Lavon Paganini, PA-C  Calcium Carbonate Antacid (CALCIUM CARBONATE, DOSED IN MG ELEMENTAL CALCIUM,) 1250 MG/5ML SUSP Take 5 mLs (500 mg of elemental calcium total) by mouth every 6 (six) hours as needed for indigestion. 03/30/19   Angiulli, Lavon Paganini, PA-C  cinacalcet (SENSIPAR) 30 MG tablet Take 1 tablet (30 mg total) by mouth every  other day. 03/30/19  Angiulli, Lavon Paganini, PA-C  clopidogrel (PLAVIX) 75 MG tablet Take 1 tablet (75 mg total) by mouth daily with breakfast. 03/30/19   Angiulli, Lavon Paganini, PA-C  colchicine 0.6 MG tablet Take 0.6 mg by mouth daily as needed (as directed for gout flares).  02/26/18   [provider]  collagenase (SANTYL) ointment Apply topically daily. Apply as directed per home health nurse.  Apply to right foot wound edge to edge nickel thick.  Cover with wet gauze followed by dry gauze and change daily 03/30/19   Angiulli, Lavon Paganini, PA-C  docusate sodium (COLACE) 100 MG capsule Take 2 capsules (200 mg total) by mouth 2 (two) times daily. 03/30/19   Angiulli, Lavon Paganini, PA-C  methocarbamol (ROBAXIN) 500 MG tablet Take 1 tablet (500 mg total) by mouth every 12 (twelve) hours as needed for muscle spasms. 03/30/19   Angiulli, Lavon Paganini, PA-C  midodrine (PROAMATINE) 10 MG tablet Take 1 tablet (10 mg total) by mouth every Monday, Wednesday, and Friday with hemodialysis. 03/30/19   Angiulli, Lavon Paganini, PA-C  multivitamin (RENA-VIT) TABS tablet Take 1 tablet by mouth at bedtime. 07/23/18   Kayleen Memos, DO  rosuvastatin (CRESTOR) 10 MG tablet Take 1 tablet (10 mg total) by mouth daily at 6 PM. 03/30/19   Angiulli, Lavon Paganini, PA-C  traMADol (ULTRAM) 50 MG tablet Take 1 tablet (50 mg total) by mouth every 12 (twelve) hours. 03/30/19   Angiulli, Lavon Paganini, PA-C     Allergies:    No Known Allergies   Physical Exam:   Vitals  Blood pressure (!) 99/59, pulse (!) 101, temperature (!) 97.5 F (36.4 C), temperature source Oral, resp. rate 17, height _0  (1.727 m), weight 72.1 kg, SpO2 96 %.  1.  General: Alert and oriented x3, person place and time  2. Psychiatric: Euthymic  3. Neurologic: Cranial nerves II through XII intact, reflexes 2+, symmetric, diffuse with no clonus on the right foot   4. HEENMT:  Anicteric, pupils, 1.5 mm, symmetric, direct, consensual, intact Neck no JVD, no bruit  5.  Respiratory : CTA B, slight decreased breath sounds on the right  6. Cardiovascular : Regular rate rhythm S1-S2 S3,4 Dialysis catheter in the left upper chest  7. Gastrointestinal:  Abdomen: Soft, nontender, nondistended, positive bowel sounds  8. Skin:  Extremities: Evidence of left BKA, 3 x 1.8 cm oval wound on the lateral aspect of the right foot, clean base  9.Musculoskeletal:  Good range of motion    Data Review:    CBC Recent Labs  Lab 04/14/19 1920  WBC 5.8  HGB 12.9*  HCT 44.2  PLT 170  MCV 101.6*  MCH 29.7  MCHC 29.2*  RDW 17.3*   ------------------------------------------------------------------------------------------------------------------  Results for orders placed or performed during the hospital encounter of 04/14/19 (from the past 48 hour(s))  CBG monitoring, ED     Status: Abnormal   Collection Time: 04/14/19  7:18 PM  Result Value Ref Range   Glucose-Capillary 158 (H) 70 - 99 mg/dL  Comprehensive metabolic panel     Status: Abnormal   Collection Time: 04/14/19  7:20 PM  Result Value Ref Range   Sodium 133 (L) 135 - 145 mmol/L   Potassium 5.6 (H) 3.5 - 5.1 mmol/L   Chloride 95 (L) 98 - 111 mmol/L   CO2 15 (L) 22 - 32 mmol/L   Glucose, Bld 164 (H) 70 - 99 mg/dL   BUN 26 (H) 8 - 23 mg/dL   Creatinine, Ser  7.41 (H) 0.61 - 1.24 mg/dL   Calcium 10.1 8.9 - 10.3 mg/dL   Total Protein 8.1 6.5 - 8.1 g/dL   Albumin 3.5 3.5 - 5.0 g/dL   AST 74 (H) 15 - 41 U/L   ALT 29 0 - 44 U/L   Alkaline Phosphatase 78 38 - 126 U/L   Total Bilirubin 1.3 (H) 0.3 - 1.2 mg/dL   GFR calc non Af Amer 7 (L) >60 mL/min   GFR calc Af Amer 8 (L) >60 mL/min   Anion gap 23 (H) 5 - 15    Comment: Performed at [facility] [address] [place], [place] [address]  CBC     Status: Abnormal   Collection Time: 04/14/19  7:20 PM  Result Value Ref Range   WBC 5.8 4.0 - 10.5 K/uL   RBC 4.35 4.22 - 5.81 MIL/uL   Hemoglobin 12.9 (L) 13.0 - 17.0 g/dL   HCT 44.2 39.0 -  52.0 %   MCV 101.6 (H) 80.0 - 100.0 fL   MCH 29.7 26.0 - 34.0 pg   MCHC 29.2 (L) 30.0 - 36.0 g/dL   RDW 17.3 (H) 11.5 - 15.5 %   Platelets 170 150 - 400 K/uL   nRBC 0.5 (H) 0.0 - 0.2 %    Comment: Performed at [facility] [address], [place], [place] [address]  Lactic acid, plasma     Status: Abnormal   Collection Time: 04/14/19  7:21 PM  Result Value Ref Range   Lactic Acid, Venous 8.6 (HH) 0.5 - 1.9 mmol/L    Comment: CRITICAL RESULT CALLED TO, READ BACK BY AND VERIFIED WITH: Marylyn Ishihara 2021 04/14/2019 D BRADLEY Performed at [facility], [place] [address], [place], [place] [address]   SARS Coronavirus 2 [facility] order, Performed in [facility] hospital lab) Nasopharyngeal Nasopharyngeal Swab     Status: None   Collection Time: 04/14/19  8:08 PM   Specimen: Nasopharyngeal Swab  Result Value Ref Range   SARS Coronavirus 2 NEGATIVE NEGATIVE    Comment: (NOTE) If result is NEGATIVE SARS-CoV-2 target nucleic acids are NOT DETECTED. The SARS-CoV-2 RNA is generally detectable in upper and lower  respiratory specimens during the acute phase of infection. The lowest  concentration of SARS-CoV-2 viral copies this assay can detect is 250  copies / mL. A negative result does not preclude SARS-CoV-2 infection  and should not be used as the sole basis for treatment or other  patient management decisions.  A negative result may occur with  improper specimen collection / handling, submission of specimen other  than nasopharyngeal swab, presence of viral mutation(s) within the  areas targeted by this assay, and inadequate number of viral copies  (<250 copies / mL). A negative result must be combined with clinical  observations, patient history, and epidemiological information. If result is POSITIVE SARS-CoV-2 target nucleic acids are DETECTED. The SARS-CoV-2 RNA is generally detectable in upper and lower  respiratory specimens dur ing the acute phase of infection.  Positive   results are indicative of active infection with SARS-CoV-2.  Clinical  correlation with patient history and other diagnostic information is  necessary to determine patient infection status.  Positive results do  not rule out bacterial infection or co-infection with other viruses. If result is PRESUMPTIVE POSTIVE SARS-CoV-2 nucleic acids MAY BE PRESENT.   A presumptive positive result was obtained on the submitted specimen  and confirmed on repeat testing.  While 2019 novel coronavirus  (  SARS-CoV-2) nucleic acids may be present in the submitted sample  additional confirmatory testing may be necessary for epidemiological  and / or clinical management purposes  to differentiate between  SARS-CoV-2 and other Sarbecovirus currently known to infect humans.  If clinically indicated additional testing with an alternate test  methodology [phone]) is advised. The SARS-CoV-2 RNA is generally  detectable in upper and lower respiratory sp ecimens during the acute  phase of infection. The expected result is Negative. Fact Sheet for Patients:  StrictlyIdeas.no Fact Sheet for Healthcare Providers: BankingDealers.co.za This test is not yet approved or cleared by the [place] FDA and has been authorized for detection and/or diagnosis of SARS-CoV-2 by FDA under an Emergency Use Authorization (EUA).  This EUA will remain in effect (meaning this test can be used) for the duration of the COVID-19 declaration under Section 564(b)(1) of the Act, 21 U.S.C. section 360bbb-3(b)(1), unless the authorization is terminated or revoked sooner. Performed at [facility], [place] [address] [place], [place] [address]     Chemistries  Recent Labs  Lab 04/14/19 1920  NA 133*  K 5.6*  CL 95*  CO2 15*  GLUCOSE 164*  BUN 26*  CREATININE 7.41*  CALCIUM 10.1  AST 74*  ALT 29  ALKPHOS 78  BILITOT 1.3*    ------------------------------------------------------------------------------------------------------------------  ------------------------------------------------------------------------------------------------------------------ GFR: Estimated Creatinine Clearance: 8.7 mL/min (A) (by C-G formula based on SCr of 7.41 mg/dL (H)). Liver Function Tests: Recent Labs  Lab 04/14/19 1920  AST 74*  ALT 29  ALKPHOS 78  BILITOT 1.3*  PROT 8.1  ALBUMIN 3.5   No results for input(s): LIPASE, AMYLASE in the last 168 hours. No results for input(s): AMMONIA in the last 168 hours. Coagulation Profile: No results for input(s): INR, PROTIME in the last 168 hours. Cardiac Enzymes: No results for input(s): CKTOTAL, CKMB, CKMBINDEX, TROPONINI in the last 168 hours. BNP (last 3 results) No results for input(s): PROBNP in the last 8760 hours. HbA1C: No results for input(s): HGBA1C in the last 72 hours. CBG: Recent Labs  Lab 04/14/19 1918  GLUCAP 158*   Lipid Profile: No results for input(s): CHOL, HDL, LDLCALC, TRIG, CHOLHDL, LDLDIRECT in the last 72 hours. Thyroid Function Tests: No results for input(s): TSH, T4TOTAL, FREET4, T3FREE, THYROIDAB in the last 72 hours. Anemia Panel: No results for input(s): VITAMINB12, FOLATE, FERRITIN, TIBC, IRON, RETICCTPCT in the last 72 hours.  --------------------------------------------------------------------------------------------------------------- Urine analysis:    Component Value Date/Time   COLORURINE YELLOW 03/11/2013 1548   APPEARANCEUR CLOUDY (A) 03/11/2013 1548   LABSPEC 1.017 03/11/2013 1548   PHURINE 5.0 03/11/2013 1548   GLUCOSEU NEGATIVE 03/11/2013 1548   HGBUR NEGATIVE 03/11/2013 1548   BILIRUBINUR NEGATIVE 03/11/2013 1548   KETONESUR NEGATIVE 03/11/2013 1548   PROTEINUR >300 (A) 03/11/2013 1548   UROBILINOGEN 1.0 03/11/2013 1548   NITRITE NEGATIVE 03/11/2013 1548   LEUKOCYTESUR SMALL (A) 03/11/2013 1548      Imaging Results:     Dg Chest Port 1 View  Result Date: 04/14/2019 CLINICAL DATA:  Patient with sepsis. EXAM: PORTABLE CHEST 1 VIEW COMPARISON:  Chest radiograph 04/22/2018 FINDINGS: Central venous catheter tip projects over the right atrium. Stable single lead AICD device overlying the left hemithorax. Monitoring leads overlie the patient. Stable cardiac and mediastinal contours. Low lung volumes. Bibasilar atelectasis. No pleural effusion or pneumothorax. IMPRESSION: Low lung volumes with basilar atelectasis. Electronically Signed   By: Lovey Newcomer M.D.   On: 04/14/2019 20:27   ? Afib at 120, nl  axis, q in 2, 3, avf, v1-3  (old)   Assessment & Plan:    Principal Problem:   Sepsis ([place]) Active Problems:   Anemia associated with chronic renal failure   Hypotension   S/P BKA (below knee amputation), left (HCC)   ESRD on dialysis ([place])  Sepsis (fever, tachycardia, hypotension), unclear source Blood culture x2 Urinalysis ordered, not certain if can make urine Vanco iv, cefepime iv pharmacy to dose  Hypotension Check trop I q3h x2 Check cortisol Check cardiac echo Cont Proamatine 58m po qday  Skin ulcer on the left foot Wound care Wound care consult  Hyperkalemia  Calcium gluconate 1gm iv x1 Sodium bicarbonate 1 amp iv x1 Kayexalate 30gm po x1 Check bmp  Check cmp in am  Abnormal liver function Check cpk Check acute hepatitis panel Check RUQ ultrasound Check cmp in am  ? Afib  Vs ST Tele Cycle trop as above Repeat EKG  ESRD on HD (MWF) Cont Rocaltrol Cont Renavite Please consult nephrology in am to arrange dialysis  Anemia Cont Auryxia Check cbc in am  Hypertension, Hyperlipidemia, H/o CHF s/p AICD/ pacer, PAD Cont aspirin 822mpo qday Cont plavix 7564mo qday Cont Crestor 40m77m qhs  Dm2 fsbs ac and qhs, ISS  H/o Gout Cont colchicine prn   H/o severe protein calorie malnutrition Cont prostat 30mL30mbid  DVT Prophylaxis-    SCDs   AM Labs Ordered, also please  review Full Orders  Family Communication: Admission, patients condition and plan of care including tests being ordered have been discussed with the patient and daughter who indicate understanding and agree with the plan and Code Status.  Code Status:  FULL CODE, per patient,  [facility]oke with daughter [facility]: Based on patients clinical presentation and evaluation of above clinical data, I have made determination that patient meets Inpatient criteria at this time.   Pt has sepsis with hypotension,  Pt will require iv abx, and further evaluation of hypotension,  Pt has high risk of clinical decline. Pt will be > 2 nites stay. Pt will require inpatient admission   Time spent in minutes : 70 minutes  JamesJani Gravelon 04/14/2019 at 10:54 PM

## 2019-04-14 NOTE — ED Notes (Signed)
ED TO INPATIENT HANDOFF REPORT  ED Nurse Name and Phone #:   S Name/Age/Gender Timothy Faulkner 73 y.o. 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 Per EMS pt was hypotensive  74/29 very sleepy on EMS arrival to his house. Last BP 110/74, hr 141, r 25, cbg 114.   Allergies No Known Allergies  Level of Care/Admitting Diagnosis ED Disposition    ED Disposition Condition [facility]: [place] [[id number]]  Level of Care: Progressive [102]  Covid Evaluation: Asymptomatic Screening Protocol (No Symptoms)  Diagnosis: Sepsis [facility]) [[id number]]  Admitting Physician: Jani Gravel [[id number]]  Attending Physician: Jani Gravel [phone]  Estimated length of stay: past midnight tomorrow  Certification:: I certify this patient will need inpatient services for at least 2 midnights  PT Class (Do Not Modify): Inpatient [101]  PT Acc Code (Do Not Modify): Private [1]       B Medical/Surgery History Past Medical History:  Diagnosis Date  . Anemia   . Arthritis    Gout  . Automatic implantable cardioverter-defibrillator in situ    [employer]  . CHF   . ESRD on dialysis [facility])    [name] 03/11/2013 (03/11/2013) dialysis M/W/F  . Gangrene ([place])    left foot  . GERD (gastroesophageal reflux disease)   . Gout    "once a year"  . Heart murmur   . HYPERCHOLESTEROLEMIA, MIXED   . HYPERTENSION   . Macular degeneration   . Osteomyelitis ([place])    right foot  . 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 Pacemaker   . Peripheral arterial disease (HCC)    left fifth toe ulcer, healing  . Pneumonia   .  Shortness of breath   . 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 Wears dentures   . Wears glasses    Past Surgical History:  Procedure Laterality Date  . A/V FISTULAGRAM Left 07/20/2018   Procedure: A/V FISTULAGRAM;  Surgeon: [name], MD;  Location: [place] [facility];  Service: Cardiovascular;  Laterality: Left;  . ABDOMINAL AORTOGRAM N/A 02/25/2019   Procedure: ABDOMINAL AORTOGRAM;  Surgeon: Elam Dutch, MD;  Location: [place] [facility];  Service: Cardiovascular;  Laterality: N/A;  . ABDOMINAL AORTOGRAM W/LOWER EXTREMITY Bilateral 07/20/2018   Procedure: ABDOMINAL AORTOGRAM W/LOWER EXTREMITY;  Surgeon: [name], MD;  Location: [place] [facility];  Service: Cardiovascular;  Laterality: Bilateral;  . AMPUTATION Right 09/07/2018   Procedure: Right fifth metatarsectomy;  Surgeon: [name], DPM;  Location: [place];  Service: Podiatry;  Laterality: Right;  . AMPUTATION Left 03/08/2019   Procedure: AMPUTATION  3RD AND 4TH TOES LEFT FOOT;  Surgeon: [name], DPM;  Location: [place];  Service: Podiatry;  Laterality: Left;  . AMPUTATION Left 03/15/2019   Procedure: AMPUTATION BELOW KNEE;  Surgeon: Elam Dutch, MD;  Location: [facility] OR;  Service: Vascular;  Laterality: Left;  . AV FISTULA PLACEMENT Right 12/13/2012   Procedure: ARTERIOVENOUS (AV) FISTULA CREATION;  Surgeon: [name], MD;  Location: [place];  Service: Vascular;  Laterality: Right;  Ultrasound guided  . AV FISTULA PLACEMENT Left 05/07/2016   Procedure: LEFT RADIOCEPHALIC ARTERIOVENOUS (AV) FISTULA CREATION;  Surgeon: [name], MD;  Location: [place];  Service: Vascular;  Laterality: Left;  . BASCILIC VEIN TRANSPOSITION Right 03/26/2016   Procedure: RIGHT BASILIC VEIN TRANSPOSITION;  Surgeon: [name], MD;  Location: [place];  Service: Vascular;  Laterality: Right;  . CARDIAC CATHETERIZATION    . CARDIAC DEFIBRILLATOR PLACEMENT     Chemical engineer  . EYE  SURGERY Bilateral    Cataract  . FISTULOGRAM Left 04/22/2018   Procedure: FISTULOGRAM UPPER EXTREMITY;  Surgeon: [name], MD;  Location: [place];  Service: Vascular;  Laterality: Left;  . I&D EXTREMITY Right 07/15/2018   Procedure: IRRIGATION AND DEBRIDEMENT RIGHT FOOT;  Surgeon: [name], DPM;  Location: [place];  Service: Podiatry;  Laterality: Right;  . INCISION AND DRAINAGE ABSCESS / HEMATOMA OF BURSA / KNEE / THIGH Left 2012   "knee" (03/11/2013)  . INSERT / REPLACE / REMOVE PACEMAKER    . INSERTION OF DIALYSIS CATHETER Left 04/22/2018   Procedure: INSERTION OF DIALYSIS CATHETER;  Surgeon: [name], MD;  Location: [place];  Service: Vascular;  Laterality: Left;  . IR FLUORO GUIDE CV LINE LEFT  07/15/2018  . IR PTA VENOUS EXCEPT DIALYSIS CIRCUIT  07/15/2018  . LOWER EXTREMITY ANGIOGRAPHY Right 07/21/2018   Procedure: LOWER EXTREMITY ANGIOGRAPHY;  Surgeon: [name], MD;  Location: [place] [facility];  Service: Cardiovascular;  Laterality: Right;  . LOWER EXTREMITY ANGIOGRAPHY Bilateral 02/25/2019   Procedure: Lower Extremity Angiography;  Surgeon: Elam Dutch, MD;  Location: [place] [facility];  Service: Cardiovascular;  Laterality: Bilateral;  . METATARSAL HEAD EXCISION Right 07/15/2018   Procedure: METATARSAL RESECTION;  Surgeon: [name], DPM;  Location: [place];  Service: Podiatry;  Laterality: Right;  . MULTIPLE TOOTH EXTRACTIONS    . PERIPHERAL VASCULAR ATHERECTOMY Right 02/28/2019   Procedure: PERIPHERAL VASCULAR ATHERECTOMY;  Surgeon: Waynetta Sandy, MD;  Location: [place] [facility];  Service: Cardiovascular;  Laterality: Right;  right tp trunk   . PERIPHERAL VASCULAR BALLOON ANGIOPLASTY Left 02/25/2019   Procedure: PERIPHERAL VASCULAR BALLOON ANGIOPLASTY;  Surgeon: Elam Dutch, MD;  Location: [place] [facility];  Service: Cardiovascular;  Laterality: Left;  tibial peroneal trunk and PT  . PERIPHERAL VASCULAR INTERVENTION  Right 07/21/2018   Procedure: PERIPHERAL VASCULAR INTERVENTION;  Surgeon: [name], MD;  Location: [place] [facility];  Service: Cardiovascular;  Laterality: Right;  peroneal stents  . REVISON OF ARTERIOVENOUS FISTULA Right 1/93/7902   Procedure: Plication OF Right Arm ARTERIOVENOUS FISTULA;  Surgeon: Elam Dutch, MD;  Location: [facility] OR;  Service: Vascular;  Laterality: Right;  . REVISON OF ARTERIOVENOUS FISTULA Left 04/22/2018   Procedure: REVISION OF RADIOCEPHALIC ARTERIOVENOUS FISTULA;  Surgeon: [name], MD;  Location: [place];  Service: Vascular;  Laterality: Left;  . SHUNTOGRAM N/A 05/31/2013   Procedure: Earney Mallet;  Surgeon: [name], MD;  Location: [facility];  Service: Cardiovascular;  Laterality: N/A;  . UPPER EXTREMITY VENOGRAPHY Right 07/23/2018   Procedure: UPPER EXTREMITY VENOGRAPHY;  Surgeon: [name], MD;  Location: [place] [facility];  Service: Cardiovascular;  Laterality: Right;  . WOUND DEBRIDEMENT Right 07/17/2018   Procedure: Wound Debridement; Closure Filleted toe flap Right Foot;  Surgeon: [name], DPM;  Location: [facility]  OR;  Service: Podiatry;  Laterality: Right;  . WOUND DEBRIDEMENT Right 09/07/2018   Procedure: Debridement Right Foot Wound, application of wound vac;  Surgeon: [name], DPM;  Location: [place];  Service: Podiatry;  Laterality: Right;  . WOUND DEBRIDEMENT Right 03/08/2019   Procedure: DEBRIDEMENT WOUND RIGHT FOOT;  Surgeon: [name], DPM;  Location: [place];  Service: Podiatry;  Laterality: Right;     A IV Location/Drains/Wounds Patient Lines/Drains/Airways Status   Active Line/Drains/Airways    Name:   Placement date:   Placement time:   Site:   Days:   Peripheral IV 07/22/18 Right Hand   07/22/18    1752    Hand   266   Peripheral IV 04/14/19 Right Hand   04/14/19    2005    Hand   less than 1   Peripheral IV 04/14/19 Right Forearm   04/14/19    2005    Forearm   less than 1   Fistula /  Graft Right Upper arm Arteriovenous fistula   10/30/15    1340    Upper arm   1262   Fistula / Graft Right Upper arm Arteriovenous fistula   03/26/16    0918    Upper arm   1114   Fistula / Graft Left Forearm Arteriovenous fistula   05/07/16    1056    Forearm   1072   Fistula / Graft Left Forearm   -    -    Forearm      Hemodialysis Catheter Right   03/15/13    1631    Internal jugular   2221   Hemodialysis Catheter Left Subclavian   -    -    Subclavian      Sheath 07/21/18 Left Arterial;Femoral   07/21/18    1036    Arterial;Femoral   267   Sheath 07/21/18 Right Arterial   07/21/18    1105    Arterial   267   Incision (Closed) 03/26/16 Arm Right   03/26/16    0923     1114   Incision (Closed) 05/07/16 Arm Left   05/07/16    1056     1072   Incision (Closed) 04/22/18 Chest Left   04/22/18    1341     357   Incision (Closed) 04/22/18 Arm Left   04/22/18    1341     357   Incision (Closed) 07/15/18 Foot Right   07/15/18    1840     273   Incision (Closed) 07/17/18 Foot Right   07/17/18    0835     271   Incision (Closed) 09/07/18 Foot Right   09/07/18    0819     219   Incision (Closed) 03/15/19 Leg Left   03/15/19    0833     30   Wound / Incision (Open or Dehisced) 03/08/19 Incision - Dehisced Foot Right surgical wound   03/08/19    0800    Foot   37          Intake/Output Last 24 hours  Intake/Output Summary (Last 24 hours) at 04/14/2019 2233 Last data filed at 04/14/2019 2215 Gross per 24 hour  Intake 246.8 ml  Output -  Net 246.8 ml    Labs/Imaging Results for orders placed or performed during the hospital encounter of 04/14/19 (from the past 48 hour(s))  CBG monitoring, ED     Status: Abnormal  Collection Time: 04/14/19  7:18 PM  Result Value Ref Range   Glucose-Capillary 158 (H) 70 - 99 mg/dL  Comprehensive metabolic panel     Status: Abnormal   Collection Time: 04/14/19  7:20 PM  Result Value Ref Range   Sodium 133 (L) 135 - 145 mmol/L   Potassium 5.6 (H) 3.5 - 5.1  mmol/L   Chloride 95 (L) 98 - 111 mmol/L   CO2 15 (L) 22 - 32 mmol/L   Glucose, Bld 164 (H) 70 - 99 mg/dL   BUN 26 (H) 8 - 23 mg/dL   Creatinine, Ser 7.41 (H) 0.61 - 1.24 mg/dL   Calcium 10.1 8.9 - 10.3 mg/dL   Total Protein 8.1 6.5 - 8.1 g/dL   Albumin 3.5 3.5 - 5.0 g/dL   AST 74 (H) 15 - 41 U/L   ALT 29 0 - 44 U/L   Alkaline Phosphatase 78 38 - 126 U/L   Total Bilirubin 1.3 (H) 0.3 - 1.2 mg/dL   GFR calc non Af Amer 7 (L) >60 mL/min   GFR calc Af Amer 8 (L) >60 mL/min   Anion gap 23 (H) 5 - 15    Comment: Performed at [facility], [address] [place], [place] [address]  CBC     Status: Abnormal   Collection Time: 04/14/19  7:20 PM  Result Value Ref Range   WBC 5.8 4.0 - 10.5 K/uL   RBC 4.35 4.22 - 5.81 MIL/uL   Hemoglobin 12.9 (L) 13.0 - 17.0 g/dL   HCT 44.2 39.0 - 52.0 %   MCV 101.6 (H) 80.0 - 100.0 fL   MCH 29.7 26.0 - 34.0 pg   MCHC 29.2 (L) 30.0 - 36.0 g/dL   RDW 17.3 (H) 11.5 - 15.5 %   Platelets 170 150 - 400 K/uL   nRBC 0.5 (H) 0.0 - 0.2 %    Comment: Performed at [facility] [address], [place], [place] [address]  Lactic acid, plasma     Status: Abnormal   Collection Time: 04/14/19  7:21 PM  Result Value Ref Range   Lactic Acid, Venous 8.6 (HH) 0.5 - 1.9 mmol/L    Comment: CRITICAL RESULT CALLED TO, READ BACK BY AND VERIFIED WITH: Marylyn Ishihara 2021 04/14/2019 D BRADLEY Performed at [facility], [place] [address] [place], [place] [address]   SARS Coronavirus 2 [facility] order, Performed in [facility] hospital lab) Nasopharyngeal Nasopharyngeal Swab     Status: None   Collection Time: 04/14/19  8:08 PM   Specimen: Nasopharyngeal Swab  Result Value Ref Range   SARS Coronavirus 2 NEGATIVE NEGATIVE    Comment: (NOTE) If result is NEGATIVE SARS-CoV-2 target nucleic acids are NOT DETECTED. The SARS-CoV-2 RNA is generally detectable in upper and lower  respiratory specimens during the acute phase of infection. The lowest  concentration of  SARS-CoV-2 viral copies this assay can detect is 250  copies / mL. A negative result does not preclude SARS-CoV-2 infection  and should not be used as the sole basis for treatment or other  patient management decisions.  A negative result may occur with  improper specimen collection / handling, submission of specimen other  than nasopharyngeal swab, presence of viral mutation(s) within the  areas targeted by this assay, and inadequate number of viral copies  (<250 copies / mL). A negative result must be combined with clinical  observations, patient history, and epidemiological information. If result is POSITIVE SARS-CoV-2 target nucleic  acids are DETECTED. The SARS-CoV-2 RNA is generally detectable in upper and lower  respiratory specimens dur ing the acute phase of infection.  Positive  results are indicative of active infection with SARS-CoV-2.  Clinical  correlation with patient history and other diagnostic information is  necessary to determine patient infection status.  Positive results do  not rule out bacterial infection or co-infection with other viruses. If result is PRESUMPTIVE POSTIVE SARS-CoV-2 nucleic acids MAY BE PRESENT.   A presumptive positive result was obtained on the submitted specimen  and confirmed on repeat testing.  While 2019 novel coronavirus  (SARS-CoV-2) nucleic acids may be present in the submitted sample  additional confirmatory testing may be necessary for epidemiological  and / or clinical management purposes  to differentiate between  SARS-CoV-2 and other Sarbecovirus currently known to infect humans.  If clinically indicated additional testing with an alternate test  methodology [phone]) is advised. The SARS-CoV-2 RNA is generally  detectable in upper and lower respiratory sp ecimens during the acute  phase of infection. The expected result is Negative. Fact Sheet for Patients:  StrictlyIdeas.no Fact Sheet for Healthcare  Providers: BankingDealers.co.za This test is not yet approved or cleared by the [place] FDA and has been authorized for detection and/or diagnosis of SARS-CoV-2 by FDA under an Emergency Use Authorization (EUA).  This EUA will remain in effect (meaning this test can be used) for the duration of the COVID-19 declaration under Section 564(b)(1) of the Act, 21 U.S.C. section 360bbb-3(b)(1), unless the authorization is terminated or revoked sooner. Performed at [facility], [place] [address], [place],  [address]    Dg Chest Port 1 View  Result Date: 04/14/2019 CLINICAL DATA:  Patient with sepsis. EXAM: PORTABLE CHEST 1 VIEW COMPARISON:  Chest radiograph 04/22/2018 FINDINGS: Central venous catheter tip projects over the right atrium. Stable single lead AICD device overlying the left hemithorax. Monitoring leads overlie the patient. Stable cardiac and mediastinal contours. Low lung volumes. Bibasilar atelectasis. No pleural effusion or pneumothorax. IMPRESSION: Low lung volumes with basilar atelectasis. Electronically Signed   By: Lovey Newcomer M.D.   On: 04/14/2019 20:27    Pending Labs Unresulted Labs (From admission, onward)    Start     Ordered   04/14/19 2006  APTT  ONCE - STAT,   STAT     04/14/19 2006   04/14/19 2006  Protime-INR  ONCE - STAT,   STAT     04/14/19 2006   04/14/19 2006  Urinalysis, Routine w reflex microscopic  ONCE - STAT,   STAT     04/14/19 2006   04/14/19 2006  Urine culture  ONCE - STAT,   STAT     04/14/19 2006   04/14/19 2006  Lipase, blood  ONCE - STAT,   STAT     04/14/19 2006   04/14/19 1921  Lactic acid, plasma  Now then every 2 hours,   STAT     04/14/19 1922   04/14/19 1921  Blood culture (routine x 2)  BLOOD CULTURE X 2,   STAT     04/14/19 1922          Vitals/Pain Today's Vitals   04/14/19 2145 04/14/19 2214 04/14/19 2215 04/14/19 2227  BP: (!) 89/56 95/61 (!) 99/59   Pulse: (!) 101 (!) 101 (!) 101   Resp:  (!) 0 19 17   Temp:    (!) 97.5 F (36.4 C)  TempSrc:    Oral  SpO2: 97%  100% 96%   Weight:      Height:      PainSc:    0-No pain    Isolation Precautions No active isolations  Medications Medications  sodium chloride flush (NS) 0.9 % injection 3 mL (3 mLs Intravenous Not Given 04/14/19 2002)  vancomycin (VANCOCIN) 1,500 mg in sodium chloride 0.9 % 500 mL IVPB (1,500 mg Intravenous New Bag/Given 04/14/19 2041)  ceFEPIme (MAXIPIME) 1 g in sodium chloride 0.9 % 100 mL IVPB (has no administration in time range)  vancomycin (VANCOCIN) IVPB 750 mg/150 ml premix (has no administration in time range)  ceFEPIme (MAXIPIME) 2 g in sodium chloride 0.9 % 100 mL IVPB (0 g Intravenous Stopped 04/14/19 2129)  metroNIDAZOLE (FLAGYL) IVPB 500 mg ( Intravenous Stopped 04/14/19 2134)  sodium chloride 0.9 % bolus 2,163 mL (2,163 mLs Intravenous New Bag/Given 04/14/19 2037)  acetaminophen (TYLENOL) suppository 650 mg (650 mg Rectal Given 04/14/19 2029)  calcium gluconate 1 g/ 50 mL sodium chloride IVPB (0 g Intravenous Stopped 04/14/19 2215)    Mobility Complete care Low fall risk   Focused Assessments Pt AO x 4, HD MWF, last treatment yesterday, admitted for sepsis with a low BP initially 70/50, sepsis protocol completed, pt got 2L NS bolus and IV abx as ordered. Pt now AO x 4, NAD noticed, ST on the monitor low 100's. No fever at this time. Wound on right foot with dressing, left BKA.   R Recommendations: See Admitting Provider Note  Report given to:   Additional Notes:

## 2019-04-14 NOTE — ED Notes (Signed)
Date and time results received: 04/14/19  (use smartphrase ".now" to insert current time)  Test:lactic  Critical Value:8.6 Name of Provider Notified: Pfieffer

## 2019-04-14 NOTE — ED Triage Notes (Signed)
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 Per EMS pt was hypotensive  74/29 very sleepy on EMS arrival to his house. Last BP 110/74, hr 141, r 25, cbg 114.

## 2019-04-15 ENCOUNTER — Inpatient Hospital Stay (HOSPITAL_COMMUNITY): Payer: Medicare Other

## 2019-04-15 DIAGNOSIS — K838 Other specified diseases of biliary tract: Secondary | ICD-10-CM

## 2019-04-15 LAB — HEPATIC FUNCTION PANEL
ALT: 25 U/L (ref 0–44)
AST: 39 U/L (ref 15–41)
Albumin: 2.8 g/dL — ABNORMAL LOW (ref 3.5–5.0)
Alkaline Phosphatase: 64 U/L (ref 38–126)
Bilirubin, Direct: <0.1 mg/dL (ref 0.0–0.2)
Total Bilirubin: 0.7 mg/dL (ref 0.3–1.2)
Total Protein: 6.5 g/dL (ref 6.5–8.1)

## 2019-04-15 LAB — PROTIME-INR
INR: 1.2 (ref 0.8–1.2)
Prothrombin Time: 15.4 seconds — ABNORMAL HIGH (ref 11.4–15.2)

## 2019-04-15 LAB — LIPASE, BLOOD: Lipase: 22 U/L (ref 11–51)

## 2019-04-15 LAB — APTT: aPTT: 40 seconds — ABNORMAL HIGH (ref 24–36)

## 2019-04-15 LAB — GLUCOSE, CAPILLARY: Glucose-Capillary: 74 mg/dL (ref 70–99)

## 2019-04-15 LAB — BASIC METABOLIC PANEL
Anion gap: 13 (ref 5–15)
BUN: 24 mg/dL — ABNORMAL HIGH (ref 8–23)
CO2: 18 mmol/L — ABNORMAL LOW (ref 22–32)
Calcium: 7.7 mg/dL — ABNORMAL LOW (ref 8.9–10.3)
Chloride: 106 mmol/L (ref 98–111)
Creatinine, Ser: 5.78 mg/dL — ABNORMAL HIGH (ref 0.61–1.24)
GFR calc Af Amer: 10 mL/min — ABNORMAL LOW (ref >60)
GFR calc non Af Amer: 9 mL/min — ABNORMAL LOW (ref >60)
Glucose, Bld: 77 mg/dL (ref 70–99)
Potassium: 4.6 mmol/L (ref 3.5–5.1)
Sodium: 137 mmol/L (ref 135–145)

## 2019-04-15 LAB — CK TOTAL AND CKMB (NOT AT ARMC)
CK, MB: 1.4 ng/mL (ref 0.5–5.0)
Relative Index: INVALID (ref 0.0–2.5)
Total CK: 49 U/L (ref 49–397)

## 2019-04-15 LAB — MRSA PCR SCREENING: MRSA by PCR: NEGATIVE

## 2019-04-15 LAB — LACTIC ACID, PLASMA: Lactic Acid, Venous: 1.6 mmol/L (ref 0.5–1.9)

## 2019-04-15 LAB — CORTISOL: Cortisol, Plasma: 11.6 ug/dL

## 2019-04-15 LAB — TROPONIN I (HIGH SENSITIVITY): Troponin I (High Sensitivity): 74 ng/L — ABNORMAL HIGH (ref <18)

## 2019-04-15 MED ORDER — HYDROCODONE-ACETAMINOPHEN 5-325 MG PO TABS
1.0000 | ORAL_TABLET | Freq: Four times a day (QID) | ORAL | Status: DC | PRN
  Administered 2019-04-15: 2 via ORAL
  Filled 2019-04-15: qty 2

## 2019-04-15 MED ORDER — CINACALCET HCL 30 MG PO TABS
30.0000 mg | ORAL_TABLET | ORAL | Status: DC
  Filled 2019-04-15 (×2): qty 1

## 2019-04-15 MED ORDER — METHOCARBAMOL 500 MG PO TABS
500.0000 mg | ORAL_TABLET | Freq: Two times a day (BID) | ORAL | Status: DC | PRN
  Administered 2019-04-15: 500 mg via ORAL
  Filled 2019-04-15 (×2): qty 1

## 2019-04-15 MED ORDER — HEPARIN SODIUM (PORCINE) 5000 UNIT/ML IJ SOLN
5000.0000 [IU] | Freq: Three times a day (TID) | INTRAMUSCULAR | Status: DC
  Administered 2019-04-15 – 2019-04-17 (×6): 5000 [IU] via SUBCUTANEOUS
  Filled 2019-04-15 (×6): qty 1

## 2019-04-15 MED ORDER — PRO-STAT SUGAR FREE PO LIQD
30.0000 mL | Freq: Two times a day (BID) | ORAL | Status: DC
  Administered 2019-04-15 – 2019-04-17 (×4): 30 mL via ORAL
  Filled 2019-04-15 (×5): qty 30

## 2019-04-15 MED ORDER — BISACODYL 5 MG PO TBEC
5.0000 mg | DELAYED_RELEASE_TABLET | Freq: Every day | ORAL | Status: DC | PRN
  Administered 2019-04-15: 5 mg via ORAL
  Filled 2019-04-15: qty 1

## 2019-04-15 MED ORDER — HEPARIN SODIUM (PORCINE) 1000 UNIT/ML IJ SOLN
INTRAMUSCULAR | Status: AC
  Administered 2019-04-15: 4200 [IU]
  Filled 2019-04-15: qty 2

## 2019-04-15 MED ORDER — CALCITRIOL 0.5 MCG PO CAPS
0.5000 ug | ORAL_CAPSULE | ORAL | Status: DC
  Administered 2019-04-15: 0.5 ug via ORAL
  Filled 2019-04-15: qty 1

## 2019-04-15 MED ORDER — CALCIUM CARBONATE ANTACID 1250 MG/5ML PO SUSP
500.0000 mg | Freq: Four times a day (QID) | ORAL | Status: DC | PRN
  Filled 2019-04-15: qty 5

## 2019-04-15 MED ORDER — CLOPIDOGREL BISULFATE 75 MG PO TABS
75.0000 mg | ORAL_TABLET | Freq: Every day | ORAL | Status: DC
  Administered 2019-04-15 – 2019-04-17 (×3): 75 mg via ORAL
  Filled 2019-04-15 (×3): qty 1

## 2019-04-15 MED ORDER — ACETAMINOPHEN 325 MG PO TABS: 650.0000 mg | ORAL_TABLET | Freq: Four times a day (QID) | ORAL | Status: DC | PRN

## 2019-04-15 MED ORDER — VANCOMYCIN HCL IN DEXTROSE 750-5 MG/150ML-% IV SOLN
INTRAVENOUS | Status: AC
  Administered 2019-04-15: 750 mg via INTRAVENOUS
  Filled 2019-04-15: qty 150

## 2019-04-15 MED ORDER — COLLAGENASE 250 UNIT/GM EX OINT
TOPICAL_OINTMENT | Freq: Every day | CUTANEOUS | Status: DC
  Administered 2019-04-16 – 2019-04-17 (×3): via TOPICAL
  Filled 2019-04-15: qty 30

## 2019-04-15 MED ORDER — ROSUVASTATIN CALCIUM 5 MG PO TABS
10.0000 mg | ORAL_TABLET | Freq: Every day | ORAL | Status: DC
  Administered 2019-04-15 – 2019-04-16 (×2): 10 mg via ORAL
  Filled 2019-04-15 (×2): qty 2

## 2019-04-15 MED ORDER — VANCOMYCIN HCL IN DEXTROSE 750-5 MG/150ML-% IV SOLN: 750.0000 mg | Freq: Once | INTRAVENOUS | Status: AC

## 2019-04-15 MED ORDER — VANCOMYCIN HCL IN DEXTROSE 750-5 MG/150ML-% IV SOLN
750.0000 mg | INTRAVENOUS | Status: DC
  Administered 2019-04-15: 16:00:00 750 mg via INTRAVENOUS

## 2019-04-15 MED ORDER — HEPARIN SODIUM (PORCINE) 1000 UNIT/ML IJ SOLN
INTRAMUSCULAR | Status: AC
  Filled 2019-04-15: qty 4

## 2019-04-15 MED ORDER — DOCUSATE SODIUM 100 MG PO CAPS
200.0000 mg | ORAL_CAPSULE | Freq: Two times a day (BID) | ORAL | Status: DC
  Administered 2019-04-15 – 2019-04-17 (×5): 200 mg via ORAL
  Filled 2019-04-15 (×5): qty 2

## 2019-04-15 MED ORDER — MIDODRINE HCL 5 MG PO TABS
10.0000 mg | ORAL_TABLET | ORAL | Status: DC
  Administered 2019-04-15: 10 mg via ORAL
  Filled 2019-04-15: qty 2

## 2019-04-15 MED ORDER — ASPIRIN EC 81 MG PO TBEC
81.0000 mg | DELAYED_RELEASE_TABLET | Freq: Every day | ORAL | Status: DC
  Administered 2019-04-15 – 2019-04-17 (×3): 81 mg via ORAL
  Filled 2019-04-15 (×3): qty 1

## 2019-04-15 MED ORDER — RENA-VITE PO TABS
1.0000 | ORAL_TABLET | Freq: Every day | ORAL | Status: DC
  Administered 2019-04-15 – 2019-04-16 (×2): 1 via ORAL
  Filled 2019-04-15 (×2): qty 1

## 2019-04-15 MED ORDER — SODIUM CHLORIDE 0.9 % IV BOLUS
250.0000 mL | Freq: Once | INTRAVENOUS | Status: AC
  Administered 2019-04-15: 250 mL via INTRAVENOUS

## 2019-04-15 MED ORDER — FERRIC CITRATE 1 GM 210 MG(FE) PO TABS
420.0000 mg | ORAL_TABLET | Freq: Three times a day (TID) | ORAL | Status: DC
  Filled 2019-04-15 (×9): qty 2

## 2019-04-15 MED ORDER — CHLORHEXIDINE GLUCONATE CLOTH 2 % EX PADS
6.0000 | MEDICATED_PAD | Freq: Every day | CUTANEOUS | Status: DC
  Administered 2019-04-15 – 2019-04-17 (×2): 6 via TOPICAL

## 2019-04-15 NOTE — Consult Note (Signed)
Referring Provider:  Dr. Antonieta Pert Primary Care Physician:  [name], MD Primary Gastroenterologist: Dr. Cannon Kettle  Reason for Consultation: Elevated liver chemistries  HPI: Timothy Faulkner is a 73 y.o. male with diabetes status post recent toe amputations, end stage renal disease on hemodialysis, history of ICD because of dilated cardiomyopathy with estimated ejection fraction of 28% in December 2014, admitted through the emergency room yesterday with altered mental status, fever to 101.4, hypotension (blood pressure 95/70), and found to have gallstones on ultrasound with a dilated common bile duct of 12 mm diameter in the setting of elevated liver chemistries (AST 74, compared to baseline of 17 a month earlier; ALT 29 compared to 6 a month earlier; bilirubin 1.3 compared to 0.9 a month earlier).    Overnight, on antibiotic therapy, the patient's liver chemistries have improved and are now all in the normal range.  The patient never had abdominal pain or significant upper tract distress.    Apparently, at the time he developed altered mental status yesterday, he was having an exacerbation of his baseline problem with tremors, which are unexplained.   He does get occasional heartburn but it does not sound as though he has been having issues with anorexia or chronic GI tract symptomatology.  The patient is not a candidate for an MRCP because he has the implantable defibrillator.   Past Medical History:  Diagnosis Date  . Anemia   . Arthritis    Gout  . Automatic implantable cardioverter-defibrillator in situ    [employer]  . CHF   . ESRD on dialysis [facility])    [name] 03/11/2013 (03/11/2013) dialysis M/W/F  . Gangrene ([place])    left foot  . GERD (gastroesophageal reflux disease)   . Gout    "once a year"  . Heart murmur   . HYPERCHOLESTEROLEMIA, MIXED   . HYPERTENSION   . Macular degeneration   . Osteomyelitis ([place])    right foot  . Other primary cardiomyopathies  07/16/2011  . Pacemaker   . Peripheral arterial disease (HCC)    left fifth toe ulcer, healing  . Pneumonia   . Shortness of breath   . 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 Wears dentures   . Wears glasses     Past Surgical History:  Procedure Laterality Date  . A/V FISTULAGRAM Left 07/20/2018   Procedure: A/V FISTULAGRAM;  Surgeon: [name], MD;  Location: [place] [facility];  Service: Cardiovascular;  Laterality: Left;  . ABDOMINAL AORTOGRAM N/A 02/25/2019   Procedure: ABDOMINAL AORTOGRAM;  Surgeon: Elam Dutch, MD;  Location: [place] [facility];  Service: Cardiovascular;  Laterality: N/A;  . ABDOMINAL AORTOGRAM W/LOWER EXTREMITY Bilateral 07/20/2018   Procedure: ABDOMINAL AORTOGRAM W/LOWER EXTREMITY;  Surgeon: [name], MD;  Location: [place] [facility];  Service: Cardiovascular;  Laterality: Bilateral;  . AMPUTATION Right 09/07/2018   Procedure: Right fifth metatarsectomy;  Surgeon: [name], DPM;  Location: [place];  Service: Podiatry;  Laterality: Right;  . AMPUTATION Left 03/08/2019   Procedure: AMPUTATION  3RD AND 4TH TOES LEFT FOOT;  Surgeon: [name], DPM;  Location: [place];  Service: Podiatry;  Laterality: Left;  . AMPUTATION Left 03/15/2019   Procedure: AMPUTATION BELOW KNEE;  Surgeon: Elam Dutch, MD;  Location: [facility] OR;  Service: Vascular;  Laterality: Left;  . AV FISTULA PLACEMENT Right 12/13/2012   Procedure: ARTERIOVENOUS (AV) FISTULA CREATION;  Surgeon: [name], MD;  Location: [place];  Service: Vascular;  Laterality: Right;  Ultrasound guided  . AV FISTULA PLACEMENT Left 05/07/2016   Procedure: LEFT RADIOCEPHALIC ARTERIOVENOUS (AV) FISTULA CREATION;  Surgeon: [name], MD;  Location: [place];  Service: Vascular;  Laterality: Left;  . BASCILIC VEIN TRANSPOSITION Right 03/26/2016   Procedure: RIGHT BASILIC VEIN TRANSPOSITION;  Surgeon: [name], MD;  Location: [place];  Service: Vascular;   Laterality: Right;  . CARDIAC CATHETERIZATION    . CARDIAC DEFIBRILLATOR PLACEMENT     Chemical engineer  . EYE SURGERY Bilateral    Cataract  . FISTULOGRAM Left 04/22/2018   Procedure: FISTULOGRAM UPPER EXTREMITY;  Surgeon: [name], MD;  Location: [place];  Service: Vascular;  Laterality: Left;  . I&D EXTREMITY Right 07/15/2018   Procedure: IRRIGATION AND DEBRIDEMENT RIGHT FOOT;  Surgeon: [name], DPM;  Location: [place];  Service: Podiatry;  Laterality: Right;  . INCISION AND DRAINAGE ABSCESS / HEMATOMA OF BURSA / KNEE / THIGH Left 2012   "knee" (03/11/2013)  . INSERT / REPLACE / REMOVE PACEMAKER    . INSERTION OF DIALYSIS CATHETER Left 04/22/2018   Procedure: INSERTION OF DIALYSIS CATHETER;  Surgeon: [name], MD;  Location: [place];  Service: Vascular;  Laterality: Left;  . IR FLUORO GUIDE CV LINE LEFT  07/15/2018  . IR PTA VENOUS EXCEPT DIALYSIS CIRCUIT  07/15/2018  . LOWER EXTREMITY ANGIOGRAPHY Right 07/21/2018   Procedure: LOWER EXTREMITY ANGIOGRAPHY;  Surgeon: [name], MD;  Location: [place] [facility];  Service: Cardiovascular;  Laterality: Right;  . LOWER EXTREMITY ANGIOGRAPHY Bilateral 02/25/2019   Procedure: Lower Extremity Angiography;  Surgeon: Elam Dutch, MD;  Location: [place] [facility];  Service: Cardiovascular;  Laterality: Bilateral;  . METATARSAL HEAD EXCISION Right 07/15/2018   Procedure: METATARSAL RESECTION;  Surgeon: [name], DPM;  Location: [place];  Service: Podiatry;  Laterality: Right;  . MULTIPLE TOOTH EXTRACTIONS    . PERIPHERAL VASCULAR ATHERECTOMY Right 02/28/2019   Procedure: PERIPHERAL VASCULAR ATHERECTOMY;  Surgeon: Waynetta Sandy, MD;  Location: [place] [facility];  Service: Cardiovascular;  Laterality: Right;  right tp trunk   . PERIPHERAL VASCULAR BALLOON ANGIOPLASTY Left 02/25/2019   Procedure: PERIPHERAL VASCULAR BALLOON ANGIOPLASTY;  Surgeon: Elam Dutch, MD;  Location: [place] [facility];  Service: Cardiovascular;  Laterality: Left;  tibial peroneal trunk and PT  . PERIPHERAL VASCULAR INTERVENTION Right 07/21/2018   Procedure: PERIPHERAL VASCULAR INTERVENTION;  Surgeon: [name], MD;  Location: [place] [facility];  Service: Cardiovascular;  Laterality: Right;  peroneal stents  . REVISON OF ARTERIOVENOUS FISTULA Right 2/83/1517   Procedure: Plication OF Right Arm ARTERIOVENOUS FISTULA;  Surgeon: Elam Dutch, MD;  Location[facility] OR;  Service: Vascular;  Laterality: Right;  . REVISON OF ARTERIOVENOUS FISTULA Left 04/22/2018   Procedure: REVISION OF RADIOCEPHALIC ARTERIOVENOUS FISTULA;  Surgeon: [name], MD;  Location: [place];  Service: Vascular;  Laterality: Left;  . SHUNTOGRAM N/A 05/31/2013   Procedure: Earney Mallet;  Surgeon: [name], MD;  Location: [facility];  Service: Cardiovascular;  Laterality: N/A;  . UPPER EXTREMITY VENOGRAPHY Right 07/23/2018   Procedure: UPPER EXTREMITY VENOGRAPHY;  Surgeon: [name], MD;  Location: [place] [facility];  Service: Cardiovascular;  Laterality: Right;  . WOUND DEBRIDEMENT Right 07/17/2018   Procedure: Wound Debridement; Closure Filleted toe flap Right Foot;  Surgeon: [name], DPM;  Location: [facility]  OR;  Service: Podiatry;  Laterality: Right;  . WOUND DEBRIDEMENT Right 09/07/2018   Procedure: Debridement Right Foot Wound, application of wound vac;  Surgeon: [name], DPM;  Location: [place];  Service: Podiatry;  Laterality: Right;  . WOUND DEBRIDEMENT Right 03/08/2019   Procedure: DEBRIDEMENT WOUND RIGHT FOOT;  Surgeon: [name], DPM;  Location: [place];  Service: Podiatry;  Laterality: Right;    Prior to Admission medications   Medication Sig Start Date End Date Taking? Authorizing Provider  acetaminophen (TYLENOL) 500 MG tablet Take 500 mg by mouth every 6 (six) hours as needed for mild pain or headache.    [provider]  Amino Acids-Protein Hydrolys (FEEDING  SUPPLEMENT, PRO-STAT SUGAR FREE 64,) LIQD Take 30 mLs by mouth 2 (two) times daily. 03/03/19   Hongalgi, Lenis Dickinson, MD  aspirin EC 81 MG tablet Take 1 tablet (81 mg total) by mouth daily. 03/30/19 03/29/20  Angiulli, Lavon Paganini, PA-C  AURYXIA 1 GM 210 MG(Fe) tablet Take 420 mg by mouth See admin instructions. Take 2 tablets (420 mg) by mouth with each meal and with each snack 11/12/16   [provider]  calcitRIOL (ROCALTROL) 0.5 MCG capsule Take 1 capsule (0.5 mcg total) by mouth every Monday, Wednesday, and Friday with hemodialysis. 03/30/19   Angiulli, Lavon Paganini, PA-C  Calcium Carbonate Antacid (CALCIUM CARBONATE, DOSED IN MG ELEMENTAL CALCIUM,) 1250 MG/5ML SUSP Take 5 mLs (500 mg of elemental calcium total) by mouth every 6 (six) hours as needed for indigestion. 03/30/19   Angiulli, Lavon Paganini, PA-C  cinacalcet (SENSIPAR) 30 MG tablet Take 1 tablet (30 mg total) by mouth every other day. 03/30/19   Angiulli, Lavon Paganini, PA-C  clopidogrel (PLAVIX) 75 MG tablet Take 1 tablet (75 mg total) by mouth daily with breakfast. 03/30/19   Angiulli, Lavon Paganini, PA-C  colchicine 0.6 MG tablet Take 0.6 mg by mouth daily as needed (as directed for gout flares).  02/26/18   [provider]  collagenase (SANTYL) ointment Apply topically daily. Apply as directed per home health nurse.  Apply to right foot wound edge to edge nickel thick.  Cover with wet gauze followed by dry gauze and change daily 03/30/19   Angiulli, Lavon Paganini, PA-C  docusate sodium (COLACE) 100 MG capsule Take 2 capsules (200 mg total) by mouth 2 (two) times daily. 03/30/19   Angiulli, Lavon Paganini, PA-C  methocarbamol (ROBAXIN) 500 MG tablet Take 1 tablet (500 mg total) by mouth every 12 (twelve) hours as needed for muscle spasms. 03/30/19   Angiulli, Lavon Paganini, PA-C  midodrine (PROAMATINE) 10 MG tablet Take 1 tablet (10 mg total) by mouth every Monday, Wednesday, and Friday with hemodialysis. 03/30/19   Angiulli, Lavon Paganini, PA-C  multivitamin (RENA-VIT) TABS  tablet Take 1 tablet by mouth at bedtime. 07/23/18   Kayleen Memos, DO  rosuvastatin (CRESTOR) 10 MG tablet Take 1 tablet (10 mg total) by mouth daily at 6 PM. 03/30/19   Angiulli, Lavon Paganini, PA-C  traMADol (ULTRAM) 50 MG tablet Take 1 tablet (50 mg total) by mouth every 12 (twelve) hours. 03/30/19   Angiulli, Lavon Paganini, PA-C    Current Facility-Administered Medications  Medication Dose Route Frequency Provider Last Rate Last Dose  . acetaminophen (TYLENOL) tablet 650 mg  650 mg Oral Q6H PRN Bodenheimer, Charles A, NP      . aspirin EC tablet 81 mg  81 mg Oral Daily Jani Gravel, MD   81 mg at 04/15/19 1034  .  bisacodyl (DULCOLAX) EC tablet 5 mg  5 mg Oral Daily PRN Arby Barrette A, NP   5 mg at 04/15/19 2043  . calcitRIOL (ROCALTROL) capsule 0.5 mcg  0.5 mcg Oral Q M,W,F-HD Jani Gravel, MD   0.5 mcg at 04/15/19 1129  . calcium carbonate (dosed in mg elemental calcium) suspension 500 mg of elemental calcium  500 mg of elemental calcium Oral Q6H PRN Jani Gravel, MD      . ceFEPIme (MAXIPIME) 1 g in sodium chloride 0.9 % 100 mL IVPB  1 g Intravenous Q24H Romona Curls, RPH 200 mL/hr at 04/15/19 2128 1 g at 04/15/19 2128  . Chlorhexidine Gluconate Cloth 2 % PADS 6 each  6 each Topical Q0600 Valentina Gu, NP   6 each at 04/15/19 1036  . cinacalcet (SENSIPAR) tablet 30 mg  30 mg Oral Q48H Jani Gravel, MD      . clopidogrel (PLAVIX) tablet 75 mg  75 mg Oral Q breakfast Jani Gravel, MD   75 mg at 04/15/19 1034  . collagenase (SANTYL) ointment   Topical Daily [name], DPM      . docusate sodium (COLACE) capsule 200 mg  200 mg Oral BID Jani Gravel, MD   200 mg at 04/15/19 2043  . feeding supplement (PRO-STAT SUGAR FREE 64) liquid 30 mL  30 mL Oral BID Jani Gravel, MD   30 mL at 04/15/19 1035  . ferric citrate (AURYXIA) tablet 420 mg  420 mg Oral TID WC Jani Gravel, MD      . heparin 1000 UNIT/ML injection           . heparin injection 5,000 Units  5,000 Units Subcutaneous Ruta Hinds, MD    5,000 Units at 04/15/19 2043  . HYDROcodone-acetaminophen (NORCO/VICODIN) 5-325 MG per tablet 1-2 tablet  1-2 tablet Oral Q6H PRN Vertis Kelch, NP   2 tablet at 04/15/19 2048  . methocarbamol (ROBAXIN) tablet 500 mg  500 mg Oral Q12H PRN Jani Gravel, MD   500 mg at 04/15/19 1129  . midodrine (PROAMATINE) tablet 10 mg  10 mg Oral Q M,W,F-HD Jani Gravel, MD   10 mg at 04/15/19 1244  . multivitamin (RENA-VIT) tablet 1 tablet  1 tablet Oral QHS Jani Gravel, MD   1 tablet at 04/15/19 2044  . rosuvastatin (CRESTOR) tablet 10 mg  10 mg Oral q1800 Jani Gravel, MD   10 mg at 04/15/19 1752  . sodium chloride flush (NS) 0.9 % injection 3 mL  3 mL Intravenous Once Charlesetta Shanks, MD      . Derrill Memo ON 04/18/2019] vancomycin (VANCOCIN) IVPB 750 mg/150 ml premix  750 mg Intravenous Q M,W,F-HD Jani Gravel, MD 150 mL/hr at 04/15/19 1617 750 mg at 04/15/19 1617    Allergies as of 04/14/2019  . (No Known Allergies)    Family History  Problem Relation Age of Onset  . Heart disease Father   . CAD Father     Social History   Socioeconomic History  . Marital status: Married    Spouse name: [name]  . Number of children: 2  . Years of education: 11  . Highest education level: Not on file  Occupational History  . Occupation: diasbled  Social Needs  . Financial resource strain: Not on file  . Food insecurity    Worry: Not on file    Inability: Not on file  . Transportation needs    Medical: Not on file    Non-medical: Not on  file  Tobacco Use  . Smoking status: Former Smoker    Packs/day: 0.75    Years: 30.00    Pack years: 22.50    Types: Cigarettes    Quit date: 11/25/1992    Years since quitting: 26.4  . Smokeless tobacco: Never Used  Substance and Sexual Activity  . Alcohol use: Not Currently  . Drug use: Not Currently    Types: Marijuana    Comment: last use 10 years ago  . Sexual activity: Yes  Lifestyle  . Physical activity    Days per week: Not on file    Minutes per session:  Not on file  . Stress: Not on file  Relationships  . Social Herbalist on phone: Not on file    Gets together: Not on file    Attends religious service: Not on file    Active member of club or organization: Not on file    Attends meetings of clubs or organizations: Not on file    Relationship status: Not on file  . Intimate partner violence    Fear of current or ex partner: Not on file    Emotionally abused: Not on file    Physically abused: Not on file    Forced sexual activity: Not on file  Other Topics Concern  . 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  Chronic tremors as mentioned above.  Physical Exam: Vital signs in last 24 hours: Temp:  [97.5 F (36.4 C)-98.3 F (36.8 C)] 98.1 F (36.7 C) (09/04 2012) Pulse Rate:  [65-101] 80 (09/04 2012) Resp:  [0-24] 14 (09/04 2012) BP: (73-122)/(42-77) 73/51 (09/04 2015) SpO2:  [87 %-100 %] 99 % (09/04 2012) Weight:  [72.4 kg-77.2 kg] 72.4 kg (09/04 1720) Last BM Date: 04/14/19 General:   Alert,  Well-developed, well-nourished, pleasant and cooperative in NAD Head:  Normocephalic and atraumatic. Eyes:  Sclera clear, no icterus.   Lungs:  Clear throughout to auscultation.   No wheezes, crackles, or rhonchi. No evident respiratory distress. Heart: Heart sounds are too distant to appreciate clearly. Abdomen:  Soft, nontender, nontympanitic, and nondistended but somewhat adipose. No masses, hepatosplenomegaly or ventral hernias noted. Normal active bowel sounds, without bruits, guarding, or rebound.   Msk:   S/p left BKA and right toe amputations (bandaged) Neurologic:  Alert and coherent;  grossly normal neurologically.  Oriented to name, date of birth, clear mentation at this time.   No tremor present at the current time. Psych:   Alert and cooperative. Normal mood and affect.  Intake/Output from previous day: 09/03 0701 - 09/04 0700 In: 2659.8 [IV Piggyback:2659.8] Out: -  Intake/Output this shift: No intake/output data recorded.  Lab Results: Recent Labs    04/14/19 1920  WBC 5.8  HGB 12.9*  HCT 44.2  PLT 170   BMET Recent Labs    04/14/19 1920 04/14/19 2311  NA 133* 137  K 5.6* 4.6  CL 95* 106  CO2 15* 18*  GLUCOSE 164* 77  BUN 26* 24*  CREATININE 7.41* 5.78*  CALCIUM 10.1 7.7*   LFT Recent Labs    04/15/19 1526  PROT 6.5  ALBUMIN 2.8*  AST 39  ALT 25  ALKPHOS 66  BILITOT 0.7  BILIDIR <0.1  IBILI NOT CALCULATED   PT/INR Recent Labs    04/15/19 0632  LABPROT 15.4*  INR 1.2    Studies/Results: Ct Head Wo Contrast  Result Date: 04/14/2019 CLINICAL DATA:  Altered mental status. EXAM: CT HEAD WITHOUT CONTRAST TECHNIQUE: Contiguous axial images were obtained from the base of the skull through the vertex without intravenous contrast. COMPARISON:  Head CT 06/10/2018 FINDINGS: Brain: No intracranial hemorrhage, mass effect, or midline shift. Unchanged degree of atrophy and chronic small vessel ischemia. No hydrocephalus. The basilar cisterns are patent. No evidence of territorial infarct or acute ischemia. No extra-axial or intracranial fluid collection. Vascular: Dense atherosclerosis of skullbase vasculature without hyperdense vessel or abnormal calcification. Skull: No fracture or focal lesion. Sinuses/Orbits: Mucous retention cyst in right maxillary greater than left sinus. No acute findings. Bilateral cataract resection. Other: None. IMPRESSION: 1. No acute intracranial abnormality. 2. Unchanged atrophy and chronic small vessel ischemia. Electronically Signed   By: Keith Rake M.D.   On: 04/14/2019 23:58   Dg Chest Port 1 View  Result Date: 04/14/2019 CLINICAL DATA:  Patient with sepsis. EXAM: PORTABLE CHEST 1 VIEW COMPARISON:  Chest  radiograph 04/22/2018 FINDINGS: Central venous catheter tip projects over the right atrium. Stable single lead AICD device overlying the left hemithorax. Monitoring leads overlie the patient. Stable cardiac and mediastinal contours. Low lung volumes. Bibasilar atelectasis. No pleural effusion or pneumothorax. IMPRESSION: Low lung volumes with basilar atelectasis. Electronically Signed   By: Lovey Newcomer M.D.   On: 04/14/2019 20:27   US Abdomen Limited Ruq  Result Date: 04/15/2019 CLINICAL DATA:  Abnormal liver function tests. EXAM: ULTRASOUND ABDOMEN LIMITED RIGHT UPPER QUADRANT COMPARISON:  None. FINDINGS: Gallbladder: Gallbladder is moderately distended to 3.3 cm. Mild gallbladder wall thickening to 6 mm. No pericholecystic fluid. Several small gallstones collect dependently within the gallbladder. Negative sonographic Murphy's sign. Common bile duct: Diameter: Dilated to 12 mm. Liver: No focal lesion identified. Within normal limits in parenchymal echogenicity. Portal vein is patent on color Doppler imaging with normal direction of blood flow towards the liver. Other: Lobular echogenic RIGHT kidney. IMPRESSION: 1. Mild gallbladder distension with gallstones. No evidence acute cholecystitis. 2. Dilated common bile duct. With above noted gallstones, concern for choledocholithiasis. Consider MRCP for further evaluation. Electronically Signed   By: Suzy Bouchard M.D.   On: 04/15/2019 10:28    Impression: 1.  Cholelithiasis 2.  Dilated bile duct  3.  Transient elevation of liver chemistries, now resolved 4.  Mild fever without leukocytosis on presentation 5.  Altered mental status, resolved  The reason for the biliary ductal dilatation is not clear; I would wonder about the possibility of common duct stones and perhaps some transient mild cholangitis which has cleared on antibiotic therapy overnight.  The transient elevation of liver chemistries could be due to transient biliary obstruction, or could  simply be "reactive hepatopathy" to some sort of infectious illness.  Plan: 1.  Await blood culture results.  If positive for gram-negative organisms, that would support the idea of a biliary etiology for the patient's clinical presentation.  2.  Continue antibiotics for now, and probably for a 3 to 5-day course even if cultures are negative.  3.  Eventually, I think this patient would benefit from having an endoscopic ultrasound exam to exclude ball valving common bile duct stones or an ampullary tumor as a possible cause for his biliary ductal dilatation.  This could be arranged to be done on an  outpatient basis.  As noted above, the patient's ICD prevents him from being able to have less invasive evaluation of the common duct, such as an MRCP.  4.  At present, I do not think there is indication for cholecystectomy since it appears that the patient's gallstones are asymptomatic.  5.  If the patient has persistent normalization of LFTs and tolerates a diet, discharge tomorrow or the next day might be appropriate.   LOS: 1 day   Youlanda Mighty Sary Bogie  04/15/2019, 9:55 PM   Pager [phone] If no answer or after 5 PM call [phone]

## 2019-04-15 NOTE — Progress Notes (Signed)
Patient being taken for dialysis at this time with transport.  No s/s of distress noted.  Denies pain.  Report was given to HD RN.  Patient was given a few snacks for the trip b/c he has yet to receive lunch.

## 2019-04-15 NOTE — Progress Notes (Signed)
Patient taken down to ultrasound at this time by transporter.  Patient stable and remains on monitor.  Remains NPO.

## 2019-04-15 NOTE — Progress Notes (Signed)
PROGRESS NOTE    Timothy Faulkner  ZES:[id number] DOB: 1945-10-26 DOA: 04/14/2019 PCP: [name], MD   Brief Narrative: As per HPI: 73 y.o. male,medical history significant ofDM; PAD s/p revascularization procedure on 7/17 and atherectomy on 7/20 with subsequent 3/4 toe amputation on the L foot 7/28; HTN; HLD; ESRD on MWF HD; AICD placement; and CHF presenting with R 2nd toe wound infection 8/1 s/p L BKA 8/4 apparently after going home from his podiatry appointment for L foot ulcer follow up was noted by family to have altered mental status and hypotension. Pt noted to be febrile in ED and hypotensive.  In ED, FEBRILE, T 101.4 P 133, R 45 Bp 95/70  Pox 99% on RA. Labs high K,CXR"Low lung volumes with basilar atelectasis", K 5.6,Bun 26, Creatinnie 7.41,Ast 74, Alt 29, Alk phos 78 T. Bili 1.3 Wbc 5.8, Hgb 12.9, Plt 170, Lactic acid 8.6, Urinalysis pending, Blood culture x2 pending. covid -19 negative he was given vanco iv, cefepime iv, flagyl iv and calcium gluconate iv given in ED and admitted for sepsis/AMS  Subjective: Seen/examined Patient is AA0X3, asymptomatic. BP has been soft. Able to recite whole history and presentation issues. Overnight BP soft, in 80s, tmax 101.4 on admission, now afebrile, k down to 4.6, wbc at 5.8k, lactic acid 8.6-->1.6 He reports his BP runs anywhere from 75 to 100-and takes midodrine.  Assessment & Plan:   Sepsis with fever tachycardia hypotension:Unclear source.Left stump appears to be healing.  Denies cough chest pain shortness of breath chest x-ray without any evidence of pneumonia, does have left HD catheter on the chest.Blood culture sent and pending for now continue on empiric vancomycin and cefepime, pharmacy to dose.Of note his hypertension appears to be chronic.  No more fever since ER and has normal lactic acid and WBC count.  Dilated CBD of 12 mm, ultrasound shows mildly distended gallbladder with gallstones, no evidence of cholecystitis.MRCP  is recommended-patient has AICD-so unable to get MRI. He denies any abdomina pain.  Recheck lft, will get GI opinion-paged [facility]- Buccini . His Tb,lipase,alt,alp nl, ast was 74  Hypotension:He reports his BP runs anywhere from 75 to 100.trop mildly + -NO CHEST PAIN. Check cortisol ok. f/u echo, likley chronic - cont Proamatine 32m po qday. Appears to be tolerating well.  S/P BKA (below knee amputation), left: Stump is healing, continue routine wound care.  ESRD on dialysis MWF-notified Dr SJonnie Finnerfor dialysis today.  Potassium appears to stable no obvious signs of fluid overload  Confusion at home- AAOX3 on presentation- currently aaox3, ct head neg, non focal on exam. likely from fever.  Monitor.  Skin ulcer on the left foot: Continue wound care, no obvious signs of infection.    Hyperkalemia : Status post temporizing measures in ER and potassium this morning normal.  Abnormal liver function, with mildly elevated AST.  Repeat LFTs in the morning, right upper quadrant ultrasound mildly distended gallbladder with gallstones no evidence of acute cholecystitis.  Hepatitis panel sent.CK normal  ? Afib  Vs ST in ER: Tele reviewed and is NSR. Repeat EKG pending. Ordered for am.Trop at 74->114.no chest pain.  Anemia of chronic renal disease, continue Auryxia, hemoglobin overall stable.  Hypertension, Hyperlipidemia, H/o CHF s/p AICD/ pacer, PAD: No chest pain, continue home aspirin Crestor and Plavix  Dm2: Blood sugar stable continue sliding scale insulin and monitor Accu-Cheks  H/o Gout: No issue cont colchicine prn   H/o severe protein calorie malnutrition:Cont prostat 321mpo bid  DVT prophylaxis:Heparin  Code Status: full code Family Communication: plan of care discussed with patient in detail. Disposition Plan: Remains inpatient pending clinical improvement.   Consultants:  Nephrology  Procedures:  CXR: low lung volumes with basilar atelectasis.  CT head: 1No  acute intracranial abnormality. 2. Unchanged atrophy and chronic small vessel ischemia.  Right upper quadrant ultrasound: 1.Mild gallbladder distension with gallstones. No evidence acute cholecystitis. 2. Dilated common bile duct. With above noted gallstones, concern for choledocholithiasis. Consider MRCP for further evaluation.   Microbiology:  Antimicrobials: Anti-infectives (From admission, onward)   Start     Dose/Rate Route Frequency Ordered Stop   04/15/19 2100  ceFEPIme (MAXIPIME) 1 g in sodium chloride 0.9 % 100 mL IVPB     1 g 200 mL/hr over 30 Minutes Intravenous Every 24 hours 04/14/19 2024     04/15/19 1200  vancomycin (VANCOCIN) IVPB 750 mg/150 ml premix     750 mg 150 mL/hr over 60 Minutes Intravenous Every M-W-F (Hemodialysis) 04/14/19 2024     04/14/19 2030  vancomycin (VANCOCIN) 1,500 mg in sodium chloride 0.9 % 500 mL IVPB     1,500 mg 250 mL/hr over 120 Minutes Intravenous  Once 04/14/19 2022 04/14/19 2346   04/14/19 2015  ceFEPIme (MAXIPIME) 2 g in sodium chloride 0.9 % 100 mL IVPB     2 g 200 mL/hr over 30 Minutes Intravenous  Once 04/14/19 2006 04/14/19 2129   04/14/19 2015  metroNIDAZOLE (FLAGYL) IVPB 500 mg     500 mg 100 mL/hr over 60 Minutes Intravenous  Once 04/14/19 2006 04/14/19 2134   04/14/19 2015  vancomycin (VANCOCIN) IVPB 1000 mg/200 mL premix  Status:  Discontinued     1,000 mg 200 mL/hr over 60 Minutes Intravenous  Once 04/14/19 2006 04/14/19 2022       Objective: Vitals:   04/15/19 0130 04/15/19 0210 04/15/19 0408 04/15/19 0512  BP: 93/63 118/71 (!) 91/42 (!) 84/48  Pulse: 96 91 82 81  Resp: 20 15 (!) 23 (!) 24  Temp:  98.3 F (36.8 C) 98 F (36.7 C) 97.7 F (36.5 C)  TempSrc:  Oral Oral Oral  SpO2: 90% 100% 99% 100%  Weight:   77.2 kg   Height:        Intake/Output Summary (Last 24 hours) at 04/15/2019 0727 Last data filed at 04/14/2019 2346 Gross per 24 hour  Intake 2659.8 ml  Output -  Net 2659.8 ml   Filed Weights    04/14/19 2104 04/15/19 0408  Weight: 72.1 kg 77.2 kg   Weight change:   Body mass index is 25.88 kg/m.  Intake/Output from previous day: 09/03 0701 - 09/04 0700 In: 2659.8 [IV Piggyback:2659.8] Out: -  Intake/Output this shift: No intake/output data recorded.  Examination:  General exam: Appears calm and comfortable,Not in distress, chronically sick looking, older fore the age HEENT:PERRL,Oral mucosa moist, Ear/Nose normal on gross exam Respiratory system: Bilateral equal air entry, normal vesicular breath sounds, no wheezes or crackles  Cardiovascular system: S1 & S2 heard,No JVD, murmurs. Gastrointestinal system: Abdomen is  soft, non tender, non distended, BS +  Nervous System:Alert and oriented. No focal neurological deficits/moving extremities, sensation intact. Extremities: lft BKA stump healing,No edema, no clubbing, distal peripheral pulses palpable. Skin: No rashes, lesions, no icterus MSK: Normal muscle bulk,tone ,power Left chest w HD catheter- no drainage, non tender.  Medications:  Scheduled Meds: . aspirin EC  81 mg Oral Daily  . calcitRIOL  0.5 mcg Oral Q M,W,F-HD  . cinacalcet  30  mg Oral Q48H  . clopidogrel  75 mg Oral Q breakfast  . docusate sodium  200 mg Oral BID  . feeding supplement (PRO-STAT SUGAR FREE 64)  30 mL Oral BID  . ferric citrate  420 mg Oral TID WC  . heparin  5,000 Units Subcutaneous Q8H  . midodrine  10 mg Oral Q M,W,F-HD  . multivitamin  1 tablet Oral QHS  . rosuvastatin  10 mg Oral q1800  . sodium chloride flush  3 mL Intravenous Once   Continuous Infusions: . ceFEPime (MAXIPIME) IV    . vancomycin      Data Reviewed: I have personally reviewed following labs and imaging studies  CBC: Recent Labs  Lab 04/14/19 1920  WBC 5.8  HGB 12.9*  HCT 44.2  MCV 101.6*  PLT 157   Basic Metabolic Panel: Recent Labs  Lab 04/14/19 1920 04/14/19 2311  NA 133* 137  K 5.6* 4.6  CL 95* 106  CO2 15* 18*  GLUCOSE 164* 77  BUN 26*  24*  CREATININE 7.41* 5.78*  CALCIUM 10.1 7.7*   GFR: Estimated Creatinine Clearance: 11.2 mL/min (A) (by C-G formula based on SCr of 5.78 mg/dL (H)). Liver Function Tests: Recent Labs  Lab 04/14/19 1920  AST 74*  ALT 29  ALKPHOS 78  BILITOT 1.3*  PROT 8.1  ALBUMIN 3.5   No results for input(s): LIPASE, AMYLASE in the last 168 hours. No results for input(s): AMMONIA in the last 168 hours. Coagulation Profile: Recent Labs  Lab 04/15/19 0632  INR 1.2   Cardiac Enzymes: No results for input(s): CKTOTAL, CKMB, CKMBINDEX, TROPONINI in the last 168 hours. BNP (last 3 results) No results for input(s): PROBNP in the last 8760 hours. HbA1C: No results for input(s): HGBA1C in the last 72 hours. CBG: Recent Labs  Lab 04/14/19 1918  GLUCAP 158*   Lipid Profile: No results for input(s): CHOL, HDL, LDLCALC, TRIG, CHOLHDL, LDLDIRECT in the last 72 hours. Thyroid Function Tests: No results for input(s): TSH, T4TOTAL, FREET4, T3FREE, THYROIDAB in the last 72 hours. Anemia Panel: No results for input(s): VITAMINB12, FOLATE, FERRITIN, TIBC, IRON, RETICCTPCT in the last 72 hours. Sepsis Labs: Recent Labs  Lab 04/14/19 1921 04/15/19 0109  LATICACIDVEN 8.6* 1.6    Recent Results (from the past 240 hour(s))  SARS Coronavirus 2 [facility] order, Performed in [facility] hospital lab) Nasopharyngeal Nasopharyngeal Swab     Status: None   Collection Time: 04/14/19  8:08 PM   Specimen: Nasopharyngeal Swab  Result Value Ref Range Status   SARS Coronavirus 2 NEGATIVE NEGATIVE Final    Comment: (NOTE) If result is NEGATIVE SARS-CoV-2 target nucleic acids are NOT DETECTED. The SARS-CoV-2 RNA is generally detectable in upper and lower  respiratory specimens during the acute phase of infection. The lowest  concentration of SARS-CoV-2 viral copies this assay can detect is 250  copies / mL. A negative result does not preclude SARS-CoV-2 infection  and should not be used as the sole basis  for treatment or other  patient management decisions.  A negative result may occur with  improper specimen collection / handling, submission of specimen other  than nasopharyngeal swab, presence of viral mutation(s) within the  areas targeted by this assay, and inadequate number of viral copies  (<250 copies / mL). A negative result must be combined with clinical  observations, patient history, and epidemiological information. If result is POSITIVE SARS-CoV-2 target nucleic acids are DETECTED. The SARS-CoV-2 RNA is generally detectable in upper and  lower  respiratory specimens dur ing the acute phase of infection.  Positive  results are indicative of active infection with SARS-CoV-2.  Clinical  correlation with patient history and other diagnostic information is  necessary to determine patient infection status.  Positive results do  not rule out bacterial infection or co-infection with other viruses. If result is PRESUMPTIVE POSTIVE SARS-CoV-2 nucleic acids MAY BE PRESENT.   A presumptive positive result was obtained on the submitted specimen  and confirmed on repeat testing.  While 2019 novel coronavirus  (SARS-CoV-2) nucleic acids may be present in the submitted sample  additional confirmatory testing may be necessary for epidemiological  and / or clinical management purposes  to differentiate between  SARS-CoV-2 and other Sarbecovirus currently known to infect humans.  If clinically indicated additional testing with an alternate test  methodology [id number]) is advised. The SARS-CoV-2 RNA is generally  detectable in upper and lower respiratory sp ecimens during the acute  phase of infection. The expected result is Negative. Fact Sheet for Patients:  StrictlyIdeas.no Fact Sheet for Healthcare Providers: BankingDealers.co.za This test is not yet approved or cleared by the [place] FDA and has been authorized for detection and/or  diagnosis of SARS-CoV-2 by FDA under an Emergency Use Authorization (EUA).  This EUA will remain in effect (meaning this test can be used) for the duration of the COVID-19 declaration under Section 564(b)(1) of the Act, 21 U.S.C. section 360bbb-3(b)(1), unless the authorization is terminated or revoked sooner. Performed at [facility], [place] [address], [place], [place] [address]   MRSA PCR Screening     Status: None   Collection Time: 04/15/19  2:20 AM   Specimen: Nasal Mucosa; Nasopharyngeal  Result Value Ref Range Status   MRSA by PCR NEGATIVE NEGATIVE Final    Comment:        The GeneXpert MRSA Assay (FDA approved for NASAL specimens only), is one component of a comprehensive MRSA colonization surveillance program. It is not intended to diagnose MRSA infection nor to guide or monitor treatment for MRSA infections. Performed at [facility], [place] [address], [place],  [address]       Radiology Studies: Ct Head Wo Contrast  Result Date: 04/14/2019 CLINICAL DATA:  Altered mental status. EXAM: CT HEAD WITHOUT CONTRAST TECHNIQUE: Contiguous axial images were obtained from the base of the skull through the vertex without intravenous contrast. COMPARISON:  Head CT 06/10/2018 FINDINGS: Brain: No intracranial hemorrhage, mass effect, or midline shift. Unchanged degree of atrophy and chronic small vessel ischemia. No hydrocephalus. The basilar cisterns are patent. No evidence of territorial infarct or acute ischemia. No extra-axial or intracranial fluid collection. Vascular: Dense atherosclerosis of skullbase vasculature without hyperdense vessel or abnormal calcification. Skull: No fracture or focal lesion. Sinuses/Orbits: Mucous retention cyst in right maxillary greater than left sinus. No acute findings. Bilateral cataract resection. Other: None. IMPRESSION: 1. No acute intracranial abnormality. 2. Unchanged atrophy and chronic small vessel ischemia. Electronically Signed    By: Keith Rake M.D.   On: 04/14/2019 23:58   Dg Chest Port 1 View  Result Date: 04/14/2019 CLINICAL DATA:  Patient with sepsis. EXAM: PORTABLE CHEST 1 VIEW COMPARISON:  Chest radiograph 04/22/2018 FINDINGS: Central venous catheter tip projects over the right atrium. Stable single lead AICD device overlying the left hemithorax. Monitoring leads overlie the patient. Stable cardiac and mediastinal contours. Low lung volumes. Bibasilar atelectasis. No pleural effusion or pneumothorax. IMPRESSION: Low lung volumes with basilar atelectasis. Electronically Signed  By: Lovey Newcomer M.D.   On: 04/14/2019 20:27      LOS: 1 day   Time spent: More than 50% of that time was spent in counseling and/or coordination of care.  Antonieta Pert, MD [facility]  04/15/2019, 7:27 AM

## 2019-04-15 NOTE — Consult Note (Signed)
Renal Service Consult Note [facility]  Timothy Faulkner 04/15/2019 [name] Requesting Physician:  Dr Jani Gravel  Reason for Consult:  ESRD pt w/ fever  HPI: The patient is a 73 y.o. year-old presented w/ LG fevers and hypotension, admit dx was sepsis. Asked to see for ESRD.    Pt reports recent R toe amps and also recent L BKA. Seen in f/u recently for both w/o issues. Denies any SOB, cough, CP, no abd pain or N/V/D.  Has been going to HD regularly.  ROS  denies CP  no joint pain   no HA  no blurry vision  no rash  no diarrhea  no nausea/ vomiting   Past Medical History  Past Medical History:  Diagnosis Date  . Anemia   . Arthritis    Gout  . Automatic implantable cardioverter-defibrillator in situ    [employer]  . CHF   . ESRD on dialysis [facility])    [name] 03/11/2013 (03/11/2013) dialysis M/W/F  . Gangrene ([place])    left foot  . GERD (gastroesophageal reflux disease)   . Gout    "once a year"  . Heart murmur   . HYPERCHOLESTEROLEMIA, MIXED   . HYPERTENSION   . Macular degeneration   . Osteomyelitis ([place])    right foot  . 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 Pacemaker   . Peripheral arterial disease (HCC)    left fifth toe ulcer, healing  . Pneumonia   . Shortness of breath   . 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 Wears dentures   . Wears glasses    Past Surgical History  Past Surgical History:  Procedure Laterality Date  . A/V FISTULAGRAM Left 07/20/2018   Procedure: A/V FISTULAGRAM;  Surgeon: [name], MD;  Location: [place] CV LAB;  Service: Cardiovascular;  Laterality: Left;  . ABDOMINAL AORTOGRAM N/A 02/25/2019   Procedure: ABDOMINAL AORTOGRAM;  Surgeon: Elam Dutch, MD;  Location: [place] CV LAB;  Service: Cardiovascular;  Laterality: N/A;  . ABDOMINAL AORTOGRAM W/LOWER EXTREMITY Bilateral 07/20/2018   Procedure: ABDOMINAL AORTOGRAM W/LOWER EXTREMITY;   Surgeon: [name], MD;  Location: [place] CV LAB;  Service: Cardiovascular;  Laterality: Bilateral;  . AMPUTATION Right 09/07/2018   Procedure: Right fifth metatarsectomy;  Surgeon: [name], DPM;  Location: [place];  Service: Podiatry;  Laterality: Right;  . AMPUTATION Left 03/08/2019   Procedure: AMPUTATION  3RD AND 4TH TOES LEFT FOOT;  Surgeon: [name], DPM;  Location: [place];  Service: Podiatry;  Laterality: Left;  . AMPUTATION Left 03/15/2019   Procedure: AMPUTATION BELOW KNEE;  Surgeon: Elam Dutch, MD;  Location: [facility] OR;  Service: Vascular;  Laterality: Left;  . AV FISTULA PLACEMENT Right 12/13/2012   Procedure: ARTERIOVENOUS (AV) FISTULA CREATION;  Surgeon: [name], MD;  Location: [place];  Service: Vascular;  Laterality: Right;  Ultrasound guided  . AV FISTULA PLACEMENT Left 05/07/2016   Procedure: LEFT RADIOCEPHALIC ARTERIOVENOUS (AV) FISTULA CREATION;  Surgeon: [name], MD;  Location: [place];  Service: Vascular;  Laterality: Left;  . BASCILIC VEIN TRANSPOSITION Right 03/26/2016   Procedure: RIGHT BASILIC VEIN TRANSPOSITION;  Surgeon: [name], MD;  Location: [place];  Service: Vascular;  Laterality: Right;  . CARDIAC CATHETERIZATION    . CARDIAC DEFIBRILLATOR PLACEMENT     Chemical engineer  . EYE SURGERY Bilateral    Cataract  .  FISTULOGRAM Left 04/22/2018   Procedure: FISTULOGRAM UPPER EXTREMITY;  Surgeon: [name], MD;  Location: [place];  Service: Vascular;  Laterality: Left;  . I&D EXTREMITY Right 07/15/2018   Procedure: IRRIGATION AND DEBRIDEMENT RIGHT FOOT;  Surgeon: [name], DPM;  Location: [place];  Service: Podiatry;  Laterality: Right;  . INCISION AND DRAINAGE ABSCESS / HEMATOMA OF BURSA / KNEE / THIGH Left 2012   "knee" (03/11/2013)  . INSERT / REPLACE / REMOVE PACEMAKER    . INSERTION OF DIALYSIS CATHETER Left 04/22/2018   Procedure: INSERTION OF DIALYSIS CATHETER;  Surgeon: [name], MD;  Location: [place];   Service: Vascular;  Laterality: Left;  . IR FLUORO GUIDE CV LINE LEFT  07/15/2018  . IR PTA VENOUS EXCEPT DIALYSIS CIRCUIT  07/15/2018  . LOWER EXTREMITY ANGIOGRAPHY Right 07/21/2018   Procedure: LOWER EXTREMITY ANGIOGRAPHY;  Surgeon: [name], MD;  Location: [place] CV LAB;  Service: Cardiovascular;  Laterality: Right;  . LOWER EXTREMITY ANGIOGRAPHY Bilateral 02/25/2019   Procedure: Lower Extremity Angiography;  Surgeon: Elam Dutch, MD;  Location: [place] CV LAB;  Service: Cardiovascular;  Laterality: Bilateral;  . METATARSAL HEAD EXCISION Right 07/15/2018   Procedure: METATARSAL RESECTION;  Surgeon: [name], DPM;  Location: [place];  Service: Podiatry;  Laterality: Right;  . MULTIPLE TOOTH EXTRACTIONS    . PERIPHERAL VASCULAR ATHERECTOMY Right 02/28/2019   Procedure: PERIPHERAL VASCULAR ATHERECTOMY;  Surgeon: Waynetta Sandy, MD;  Location: [place] CV LAB;  Service: Cardiovascular;  Laterality: Right;  right tp trunk   . PERIPHERAL VASCULAR BALLOON ANGIOPLASTY Left 02/25/2019   Procedure: PERIPHERAL VASCULAR BALLOON ANGIOPLASTY;  Surgeon: Elam Dutch, MD;  Location: [place] CV LAB;  Service: Cardiovascular;  Laterality: Left;  tibial peroneal trunk and PT  . PERIPHERAL VASCULAR INTERVENTION Right 07/21/2018   Procedure: PERIPHERAL VASCULAR INTERVENTION;  Surgeon: [name], MD;  Location: [place] CV LAB;  Service: Cardiovascular;  Laterality: Right;  peroneal stents  . REVISON OF ARTERIOVENOUS FISTULA Right 04/24/7828   Procedure: Plication OF Right Arm ARTERIOVENOUS FISTULA;  Surgeon: Elam Dutch, MD;  Location: [facility] OR;  Service: Vascular;  Laterality: Right;  . REVISON OF ARTERIOVENOUS FISTULA Left 04/22/2018   Procedure: REVISION OF RADIOCEPHALIC ARTERIOVENOUS FISTULA;  Surgeon: [name], MD;  Location: [place];  Service: Vascular;  Laterality: Left;  . SHUNTOGRAM N/A 05/31/2013   Procedure: [place] Mal[place];  Surgeon:  [name], MD;  Location: [facility] CATH LAB;  Service: Cardiovascular;  Laterality: N/A;  . UPPER EXTREMITY VENOGRAPHY Right 07/23/2018   Procedure: UPPER EXTREMITY VENOGRAPHY;  Surgeon: [name], MD;  Location: [place] CV LAB;  Service: Cardiovascular;  Laterality: Right;  . WOUND DEBRIDEMENT Right 07/17/2018   Procedure: Wound Debridement; Closure Filleted toe flap Right Foot;  Surgeon: [name], DPM;  Location: [place];  Service: Podiatry;  Laterality: Right;  . WOUND DEBRIDEMENT Right 09/07/2018   Procedure: Debridement Right Foot Wound, application of wound vac;  Surgeon: [name], DPM;  Location: [place];  Service: Podiatry;  Laterality: Right;  . WOUND DEBRIDEMENT Right 03/08/2019   Procedure: DEBRIDEMENT WOUND RIGHT FOOT;  Surgeon: [name], DPM;  Location: [place];  Service: Podiatry;  Laterality: Right;   Family History  Family History  Problem Relation Age of Onset  . Heart disease Father   . CAD Father    Social History  reports that he quit smoking about 26 years ago. His  smoking use included cigarettes. He has a 22.50 pack-year smoking history. He has never used smokeless tobacco. He reports previous alcohol use. He reports previous drug use. Drug: Marijuana. Allergies No Known Allergies Home medications Prior to Admission medications   Medication Sig Start Date End Date Taking? Authorizing Provider  acetaminophen (TYLENOL) 500 MG tablet Take 500 mg by mouth every 6 (six) hours as needed for mild pain or headache.    [provider]  Amino Acids-Protein Hydrolys (FEEDING SUPPLEMENT, PRO-STAT SUGAR FREE 64,) LIQD Take 30 mLs by mouth 2 (two) times daily. 03/03/19   Hongalgi, Lenis Dickinson, MD  aspirin EC 81 MG tablet Take 1 tablet (81 mg total) by mouth daily. 03/30/19 03/29/20  Angiulli, Lavon Paganini, PA-C  AURYXIA 1 GM 210 MG(Fe) tablet Take 420 mg by mouth See admin instructions. Take 2 tablets (420 mg) by mouth with each meal and with each snack  11/12/16   [provider]  calcitRIOL (ROCALTROL) 0.5 MCG capsule Take 1 capsule (0.5 mcg total) by mouth every Monday, Wednesday, and Friday with hemodialysis. 03/30/19   Angiulli, Lavon Paganini, PA-C  Calcium Carbonate Antacid (CALCIUM CARBONATE, DOSED IN MG ELEMENTAL CALCIUM,) 1250 MG/5ML SUSP Take 5 mLs (500 mg of elemental calcium total) by mouth every 6 (six) hours as needed for indigestion. 03/30/19   Angiulli, Lavon Paganini, PA-C  cinacalcet (SENSIPAR) 30 MG tablet Take 1 tablet (30 mg total) by mouth every other day. 03/30/19   Angiulli, Lavon Paganini, PA-C  clopidogrel (PLAVIX) 75 MG tablet Take 1 tablet (75 mg total) by mouth daily with breakfast. 03/30/19   Angiulli, Lavon Paganini, PA-C  colchicine 0.6 MG tablet Take 0.6 mg by mouth daily as needed (as directed for gout flares).  02/26/18   [provider]  collagenase (SANTYL) ointment Apply topically daily. Apply as directed per home health nurse.  Apply to right foot wound edge to edge nickel thick.  Cover with wet gauze followed by dry gauze and change daily 03/30/19   Angiulli, Lavon Paganini, PA-C  docusate sodium (COLACE) 100 MG capsule Take 2 capsules (200 mg total) by mouth 2 (two) times daily. 03/30/19   Angiulli, Lavon Paganini, PA-C  methocarbamol (ROBAXIN) 500 MG tablet Take 1 tablet (500 mg total) by mouth every 12 (twelve) hours as needed for muscle spasms. 03/30/19   Angiulli, Lavon Paganini, PA-C  midodrine (PROAMATINE) 10 MG tablet Take 1 tablet (10 mg total) by mouth every Monday, Wednesday, and Friday with hemodialysis. 03/30/19   Angiulli, Lavon Paganini, PA-C  multivitamin (RENA-VIT) TABS tablet Take 1 tablet by mouth at bedtime. 07/23/18   Kayleen Memos, DO  rosuvastatin (CRESTOR) 10 MG tablet Take 1 tablet (10 mg total) by mouth daily at 6 PM. 03/30/19   Angiulli, Lavon Paganini, PA-C  traMADol (ULTRAM) 50 MG tablet Take 1 tablet (50 mg total) by mouth every 12 (twelve) hours. 03/30/19   Cathlyn Parsons, PA-C   Liver Function Tests Recent Labs  Lab  04/14/19 1920  AST 74*  ALT 29  ALKPHOS 78  BILITOT 1.3*  PROT 8.1  ALBUMIN 3.5   Recent Labs  Lab 04/15/19 0542  LIPASE 22   CBC Recent Labs  Lab 04/14/19 1920  WBC 5.8  HGB 12.9*  HCT 44.2  MCV 101.6*  PLT 883   Basic Metabolic Panel Recent Labs  Lab 04/14/19 1920 04/14/19 2311  NA 133* 137  K 5.6* 4.6  CL 95* 106  CO2 15* 18*  GLUCOSE 164* 77  BUN 26* 24*  CREATININE 7.41* 5.78*  CALCIUM 10.1 7.7*   Iron/TIBC/Ferritin/ %Sat    Component Value Date/Time   IRON 32 (L) 03/22/2019 0753   TIBC 140 (L) 03/22/2019 0753   FERRITIN 864 (H) 03/22/2019 0753   IRONPCTSAT 23 03/22/2019 0753    Vitals:   04/15/19 0408 04/15/19 0512 04/15/19 0832 04/15/19 1200  BP: (!) 91/42 (!) 84/48  104/67  Pulse: 82 81 76 76  Resp: (!) 23 (!) 24 20 18   Temp: 98 F (36.7 C) 97.7 F (36.5 C) (!) 97.5 F (36.4 C) 97.6 F (36.4 C)  TempSrc: Oral Oral Oral Oral  SpO2: 99% 100% 100% 100%  Weight: 77.2 kg     Height:        Exam Gen alert, no distress No rash, cyanosis or gangrene Sclera anicteric, throat clear  No jvd or bruits Chest clear bilat to bases RRR no MRG Abd soft ntnd no mass or ascites +bs GU normal male MS no joint effusions or deformity Ext no LE or UE edema, L BKA wound partially dehisced w/ darkening of wound edges, R lateral foot wound w/o drainage or odor, clean base Neuro is alert, Ox 3 , nf    Home meds:  - midodrine 10 pre hd mwf  - clopidogrel 75 qd/ aspirin 81 qd/ rosuvastatin 10 hs  - auryxia 420 ac tid/ cinacalcet 30 qod  - prn's/ vitamins/ supplements     Outpt HD: [place] M[facility]  4h   73kg   3K/2.5Ca bath  400/800  L TDC   Hep 5000 + 2094midrun  - mircera 100 ug q2wks, last ?     Assessment/ Plan: 1. Fever/ hypotension - sepsis w/u in progress 2. SP L BKA - some wound dehiscence and discoloration, will call [facility] to eval.  3. SP R foot toe amps - f/b podiatry 4. ESRD -on HD MWF. Plan HD today.  5. BP/volume - takes midodrine pre HD  tiw. No vol excess, up 3-4kg by wts 6. CHF/ sp AICD 7. Anemia ckd -hb 12.9 which is good 8. MBD ckd  - cont meds      [name]  MD 04/15/2019, 1:36 PM

## 2019-04-15 NOTE — Progress Notes (Signed)
Subjective:  Patient ID: Timothy Faulkner, male    DOB: Aug 02, 1946,  MRN: [id number]  Chief Complaint  Patient presents with  . Diabetic Ulcer    right lateral fifth metatarsal - patient seems to be in good spirits today    73 y.o. male presents for wound care. Patient seems lethargic. Per daughter Timothy Faulkner patient has been lethargic the past couple of days. Has appt for BKA fitting on Tuesday.  Review of Systems: Negative except as noted in the HPI. Denies N/V/F/Ch.  Past Medical History:  Diagnosis Date  . Anemia   . Arthritis    Gout  . Automatic implantable cardioverter-defibrillator in situ    [employer]  . CHF   . ESRD on dialysis [facility])    [name] 03/11/2013 (03/11/2013) dialysis M/W/F  . Gangrene ([place])    left foot  . GERD (gastroesophageal reflux disease)   . Gout    "once a year"  . Heart murmur   . HYPERCHOLESTEROLEMIA, MIXED   . HYPERTENSION   . Macular degeneration   . Osteomyelitis ([place])    right foot  . 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 Pacemaker   . Peripheral arterial disease (HCC)    left fifth toe ulcer, healing  . Pneumonia   . Shortness of breath   . 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 Wears dentures   . Wears glasses    No current facility-administered medications for this visit.  No current outpatient medications on file.  Facility-Administered Medications Ordered in Other Visits:  .  aspirin EC tablet 81 mg, 81 mg, Oral, Daily, Jani Gravel, MD, 81 mg at 04/15/19 1034 .  calcitRIOL (ROCALTROL) capsule 0.5 mcg, 0.5 mcg, Oral, Q M,W,F-HD, Jani Gravel, MD, 0.5 mcg at 04/15/19 1129 .  calcium carbonate (dosed in mg elemental calcium) suspension 500 mg of elemental calcium, 500 mg of elemental calcium, Oral, Q6H PRN, Jani Gravel, MD .  ceFEPIme (MAXIPIME) 1 g in sodium chloride 0.9 % 100 mL IVPB, 1 g, Intravenous, Q24H, Romona Curls, [facility] .  Chlorhexidine Gluconate Cloth 2 % PADS 6 each, 6  each, Topical, Q0600, Valentina Gu, NP, 6 each at 04/15/19 1036 .  cinacalcet (SENSIPAR) tablet 30 mg, 30 mg, Oral, Q48H, Jani Gravel, MD .  clopidogrel (PLAVIX) tablet 75 mg, 75 mg, Oral, Q breakfast, Jani Gravel, MD, 75 mg at 04/15/19 1034 .  collagenase (SANTYL) ointment, , Topical, Daily, [name], DPM .  docusate sodium (COLACE) capsule 200 mg, 200 mg, Oral, BID, Jani Gravel, MD, 200 mg at 04/15/19 1034 .  feeding supplement (PRO-STAT SUGAR FREE 64) liquid 30 mL, 30 mL, Oral, BID, Jani Gravel, MD, 30 mL at 04/15/19 1035 .  ferric citrate (AURYXIA) tablet 420 mg, 420 mg, Oral, TID WC, Jani Gravel, MD .  heparin 1000 UNIT/ML injection, , , ,  .  heparin 1000 UNIT/ML injection, , , ,  .  heparin injection 5,000 Units, 5,000 Units, Subcutaneous, Q8H, Kc, Ramesh, MD, 5,000 Units at 04/15/19 1035 .  methocarbamol (ROBAXIN) tablet 500 mg, 500 mg, Oral, Q12H PRN, Jani Gravel, MD, 500 mg at 04/15/19 1129 .  midodrine (PROAMATINE) tablet 10 mg, 10 mg, Oral, Q M,W,F-HD, Jani Gravel, MD, 10 mg at 04/15/19 1244 .  multivitamin (RENA-VIT) tablet 1 tablet, 1 tablet, Oral, QHS, Jani Gravel, MD .  rosuvastatin (CRESTOR) tablet 10 mg, 10 mg, Oral, q1800, Jani Gravel, MD .  sodium  chloride flush (NS) 0.9 % injection 3 mL, 3 mL, Intravenous, Once, Pfeiffer, Jeannie Done, MD .  Derrill Memo ON 04/18/2019] vancomycin (VANCOCIN) IVPB 750 mg/150 ml premix, 750 mg, Intravenous, Q M,W,F-HD, Jani Gravel, MD, Last Rate: 150 mL/hr at 04/15/19 1617, 750 mg at 04/15/19 1617 .  vancomycin (VANCOCIN) IVPB 750 mg/150 ml premix, 750 mg, Intravenous, Once, [place], Donalynn Furlong, [place]  Social History   Tobacco Use  Smoking Status Former Smoker  . Packs/day: 0.75  . Years: 30.00  . Pack years: 22.50  . Types: Cigarettes  . Quit date: 11/25/1992  . Years since quitting: 26.4  Smokeless Tobacco Never Used    No Known Allergies Objective:  There were no vitals filed for this visit. There is no height or weight on file to calculate  BMI. Constitutional Well developed.  Vascular Right foot warm. Good capillary refill to remaining digits.  Neurologic Normal speech. Oriented to person, place, and time. Protective sensation absent  Dermatologic Wound Location: Right foot lateral 5th met area Wound Base: Granular/Healthy Peri-wound: Normal Exudate: None: wound tissue dry Wound Measurements: -3x2  Orthopedic: No pain to palpation.   Radiographs: None Assessment:   1. Diabetic ulcer of other part of right foot associated with diabetes mellitus due to underlying condition, limited to breakdown of skin [facility])    Plan:  Patient was evaluated and treated and all questions answered.  Ulcer right foot -Debridement as below. -Dressed with collagen gel, DSD. -Continue off-loading with surgical shoe.  Procedure: Selective Debridement of Wound Rationale: Removal of devitalized tissue from the wound to promote healing.  Pre-Debridement Wound Measurements: 3 cm x 2 cm x 0.2 cm  Post-Debridement Wound Measurements: same as pre-debridement. Type of Debridement: sharp selective Tissue Removed: Devitalized soft-tissue Dressing: Dry, sterile, compression dressing. Disposition: Patient tolerated procedure well. Patient to return in 1 week for follow-up.    No follow-ups on file.

## 2019-04-15 NOTE — Progress Notes (Signed)
Patient seen in the hospital as courtesy. Was seen yesterday in the office. Was noted to be lethargic. Wound was well appearing without signs of infection.   Again today wound well appearing without signs of infection. No drainage, no warmth, no erythema. Wound base almost wholly granular. Dressed with aquacell and DSD.  I am available for any concerns of the right foot wound. Recommend Santyl WTD dressing daily by nursing.  [name], DPM  Please secure chat with questions or concerns.

## 2019-04-15 NOTE — ED Notes (Signed)
Report attempted, RN to call back. 

## 2019-04-15 NOTE — Progress Notes (Signed)
Patient returned to room at this time.  No s/s of distress noted denies pain.  Sat dinner tray up at this time.

## 2019-04-15 NOTE — Consult Note (Addendum)
[facility] NOTE   MRN : [id number]  Reason for Consult: Left BKA with separation Referring Physician: Juanell Fairly NP Nephrology  History of Present Illness: Timothy Faulkner is a 73 y.o. male with multiple medical problems that previously underwent right lower extremity intervention for critical limb ischemia with tissue loss.  He subsequently had right fifth toe amputation with debridement of the foot by podiatry and is currently on a Santyl dressing.  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 Oneida Alar.  In July, he had a CSI atherectomy of the right tibioperoneal trunk as well as balloon angioplasty of the tibioperoneal trunk and PT artery by Dr. Donzetta Matters.  He was last seen on 04/12/19 to remove staples and start prothesis stump sinkers.  He tolerated the staple remove and the stump was well healed at the time of his last visit.    He came to the hospital with hypotension and fever 101.4 Tm.  We have been asked to f/u on his left BKA stump with reported new separation at the incision.  His daughter states he was getting out of the car and hit his stump.  He denise drainage and increase pain.  DM; PAD s/p revascularization procedure on 7/17 and atherectomy on 7/20 with subsequent 3/4 toe amputation on the L foot 7/28; HTN; HLD; ESRD on MWF HD; AICD placement; and CHF presenting with R 2nd toe wound infection 8/1 s/p L BKA      Current Facility-Administered Medications  Medication Dose Route Frequency Provider Last Rate Last Dose  . aspirin EC tablet 81 mg  81 mg Oral Daily Jani Gravel, MD   81 mg at 04/15/19 1034  . calcitRIOL (ROCALTROL) capsule 0.5 mcg  0.5 mcg Oral Q M,W,F-HD Jani Gravel, MD   0.5 mcg at 04/15/19 1129  . calcium carbonate (dosed in mg elemental calcium) suspension 500 mg of elemental calcium  500 mg of elemental calcium Oral Q6H PRN Jani Gravel, MD      . ceFEPIme (MAXIPIME) 1 g in sodium chloride 0.9 % 100 mL IVPB  1 g Intravenous Q24H Romona Curls, [facility]       . Chlorhexidine Gluconate Cloth 2 % PADS 6 each  6 each Topical Q0600 Valentina Gu, NP   6 each at 04/15/19 1036  . cinacalcet (SENSIPAR) tablet 30 mg  30 mg Oral Q48H Jani Gravel, MD      . clopidogrel (PLAVIX) tablet 75 mg  75 mg Oral Q breakfast Jani Gravel, MD   75 mg at 04/15/19 1034  . docusate sodium (COLACE) capsule 200 mg  200 mg Oral BID Jani Gravel, MD   200 mg at 04/15/19 1034  . feeding supplement (PRO-STAT SUGAR FREE 64) liquid 30 mL  30 mL Oral BID Jani Gravel, MD   30 mL at 04/15/19 1035  . ferric citrate (AURYXIA) tablet 420 mg  420 mg Oral TID WC Jani Gravel, MD      . heparin injection 5,000 Units  5,000 Units Subcutaneous Ruta Hinds, MD   5,000 Units at 04/15/19 1035  . methocarbamol (ROBAXIN) tablet 500 mg  500 mg Oral Q12H PRN Jani Gravel, MD   500 mg at 04/15/19 1129  . midodrine (PROAMATINE) tablet 10 mg  10 mg Oral Q M,W,F-HD Jani Gravel, MD      . multivitamin (RENA-VIT) tablet 1 tablet  1 tablet Oral QHS Jani Gravel, MD      . rosuvastatin (CRESTOR) tablet 10 mg  10 mg Oral q1800 Jani Gravel, MD      . sodium chloride flush (NS) 0.9 % injection 3 mL  3 mL Intravenous Once Charlesetta Shanks, MD      . vancomycin (VANCOCIN) IVPB 750 mg/150 ml premix  750 mg Intravenous Q M,W,F-HD Elicia Lamp P, RPH        Pt meds include: Statin :Yes Betablocker: No ASA: Yes Other anticoagulants/antiplatelets: Plavix  Past Medical History:  Diagnosis Date  . Anemia   . Arthritis    Gout  . Automatic implantable cardioverter-defibrillator in situ    [employer]  . CHF   . ESRD on dialysis [facility])    [name] 03/11/2013 (03/11/2013) dialysis M/W/F  . Gangrene ([place])    left foot  . GERD (gastroesophageal reflux disease)   . Gout    "once a year"  . Heart murmur   . HYPERCHOLESTEROLEMIA, MIXED   . HYPERTENSION   . Macular degeneration   . Osteomyelitis ([place])    right foot  . 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 Pacemaker   . Peripheral arterial disease (HCC)     left fifth toe ulcer, healing  . Pneumonia   . Shortness of breath   . 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 Wears dentures   . Wears glasses     Past Surgical History:  Procedure Laterality Date  . A/V FISTULAGRAM Left 07/20/2018   Procedure: A/V FISTULAGRAM;  Surgeon: [name], MD;  Location: [place] [facility];  Service: Cardiovascular;  Laterality: Left;  . ABDOMINAL AORTOGRAM N/A 02/25/2019   Procedure: ABDOMINAL AORTOGRAM;  Surgeon: Elam Dutch, MD;  Location: [place] [facility];  Service: Cardiovascular;  Laterality: N/A;  . ABDOMINAL AORTOGRAM W/LOWER EXTREMITY Bilateral 07/20/2018   Procedure: ABDOMINAL AORTOGRAM W/LOWER EXTREMITY;  Surgeon: [name], MD;  Location: [place] [facility];  Service: Cardiovascular;  Laterality: Bilateral;  . AMPUTATION Right 09/07/2018   Procedure: Right fifth metatarsectomy;  Surgeon: [name], DPM;  Location: [place];  Service: Podiatry;  Laterality: Right;  . AMPUTATION Left 03/08/2019   Procedure: AMPUTATION  3RD AND 4TH TOES LEFT FOOT;  Surgeon: [name], DPM;  Location: [place];  Service: Podiatry;  Laterality: Left;  . AMPUTATION Left 03/15/2019   Procedure: AMPUTATION BELOW KNEE;  Surgeon: Elam Dutch, MD;  Location: [facility] OR;  Service: Vascular;  Laterality: Left;  . AV FISTULA PLACEMENT Right 12/13/2012   Procedure: ARTERIOVENOUS (AV) FISTULA CREATION;  Surgeon: [name], MD;  Location: [place];  Service: Vascular;  Laterality: Right;  Ultrasound guided  . AV FISTULA PLACEMENT Left 05/07/2016   Procedure: LEFT RADIOCEPHALIC ARTERIOVENOUS (AV) FISTULA CREATION;  Surgeon: [name], MD;  Location: [place];  Service: Vascular;  Laterality: Left;  . BASCILIC VEIN TRANSPOSITION Right 03/26/2016   Procedure: RIGHT BASILIC VEIN TRANSPOSITION;  Surgeon: [name], MD;  Location: [place];  Service: Vascular;  Laterality: Right;  . CARDIAC CATHETERIZATION    . CARDIAC  DEFIBRILLATOR PLACEMENT     Chemical engineer  . EYE SURGERY Bilateral    Cataract  . FISTULOGRAM Left 04/22/2018   Procedure: FISTULOGRAM UPPER EXTREMITY;  Surgeon: [name], MD;  Location: [place];  Service: Vascular;  Laterality: Left;  . I&D EXTREMITY Right 07/15/2018   Procedure: IRRIGATION AND DEBRIDEMENT RIGHT FOOT;  Surgeon: [name], DPM;  Location: [place];  Service: Podiatry;  Laterality: Right;  . INCISION AND DRAINAGE ABSCESS / HEMATOMA OF BURSA / KNEE / THIGH Left 2012   "knee" (03/11/2013)  . INSERT / REPLACE / REMOVE PACEMAKER    . INSERTION OF DIALYSIS CATHETER Left 04/22/2018   Procedure: INSERTION OF DIALYSIS CATHETER;  Surgeon: [name], MD;  Location: [place];  Service: Vascular;  Laterality: Left;  . IR FLUORO GUIDE CV LINE LEFT  07/15/2018  . IR PTA VENOUS EXCEPT DIALYSIS CIRCUIT  07/15/2018  . LOWER EXTREMITY ANGIOGRAPHY Right 07/21/2018   Procedure: LOWER EXTREMITY ANGIOGRAPHY;  Surgeon: [name], MD;  Location: [place] [facility];  Service: Cardiovascular;  Laterality: Right;  . LOWER EXTREMITY ANGIOGRAPHY Bilateral 02/25/2019   Procedure: Lower Extremity Angiography;  Surgeon: Elam Dutch, MD;  Location: [place] [facility];  Service: Cardiovascular;  Laterality: Bilateral;  . METATARSAL HEAD EXCISION Right 07/15/2018   Procedure: METATARSAL RESECTION;  Surgeon: [name], DPM;  Location: [place];  Service: Podiatry;  Laterality: Right;  . MULTIPLE TOOTH EXTRACTIONS    . PERIPHERAL VASCULAR ATHERECTOMY Right 02/28/2019   Procedure: PERIPHERAL VASCULAR ATHERECTOMY;  Surgeon: Waynetta Sandy, MD;  Location: [place] [facility];  Service: Cardiovascular;  Laterality: Right;  right tp trunk   . PERIPHERAL VASCULAR BALLOON ANGIOPLASTY Left 02/25/2019   Procedure: PERIPHERAL VASCULAR BALLOON ANGIOPLASTY;  Surgeon: Elam Dutch, MD;  Location: [place] [facility];  Service: Cardiovascular;  Laterality: Left;  tibial  peroneal trunk and PT  . PERIPHERAL VASCULAR INTERVENTION Right 07/21/2018   Procedure: PERIPHERAL VASCULAR INTERVENTION;  Surgeon: [name], MD;  Location: [place] [facility];  Service: Cardiovascular;  Laterality: Right;  peroneal stents  . REVISON OF ARTERIOVENOUS FISTULA Right 7/65/4650   Procedure: Plication OF Right Arm ARTERIOVENOUS FISTULA;  Surgeon: Elam Dutch, MD;  Location: [facility] OR;  Service: Vascular;  Laterality: Right;  . REVISON OF ARTERIOVENOUS FISTULA Left 04/22/2018   Procedure: REVISION OF RADIOCEPHALIC ARTERIOVENOUS FISTULA;  Surgeon: [name], MD;  Location: [place];  Service: Vascular;  Laterality: Left;  . SHUNTOGRAM N/A 05/31/2013   Procedure: [place]ney Mal[place];  Surgeon: [name], MD;  Location: [facility] CATH LAB;  Service: Cardiovascular;  Laterality: N/A;  . UPPER EXTREMITY VENOGRAPHY Right 07/23/2018   Procedure: UPPER EXTREMITY VENOGRAPHY;  Surgeon: [name], MD;  Location: [place] [facility];  Service: Cardiovascular;  Laterality: Right;  . WOUND DEBRIDEMENT Right 07/17/2018   Procedure: Wound Debridement; Closure Filleted toe flap Right Foot;  Surgeon: [name], DPM;  Location: [place];  Service: Podiatry;  Laterality: Right;  . WOUND DEBRIDEMENT Right 09/07/2018   Procedure: Debridement Right Foot Wound, application of wound vac;  Surgeon: [name], DPM;  Location: [place];  Service: Podiatry;  Laterality: Right;  . WOUND DEBRIDEMENT Right 03/08/2019   Procedure: DEBRIDEMENT WOUND RIGHT FOOT;  Surgeon: [name], DPM;  Location: [place];  Service: Podiatry;  Laterality: Right;    Social History Social History   Tobacco Use  . Smoking status: Former Smoker    Packs/day: 0.75    Years: 30.00    Pack years: 22.50    Types: Cigarettes    Quit date: 11/25/1992    Years since quitting: 26.4  . Smokeless tobacco: Never Used  Substance Use Topics  . Alcohol use: Not Currently  . Drug use: Not Currently    Types:  Marijuana    Comment: last use 10 years ago    Family History  Family History  Problem Relation Age of Onset  . Heart disease Father   . CAD Father     No Known Allergies   REVIEW OF SYSTEMS  General: [ ]  Weight loss, [ ]  Fever, [ ]  chills Neurologic: [ ]  Dizziness, [ ]  Blackouts, [ ]  Seizure [ ]  Stroke, [ ]  "Mini stroke", [ ]  Slurred speech, [ ]  Temporary blindness; [ ]  weakness in arms or legs, [ ]  Hoarseness [ ]  Dysphagia Cardiac: [ ]  Chest pain/pressure, [ ]  Shortness of breath at rest [ ]  Shortness of breath with exertion, [ ]  Atrial fibrillation or irregular heartbeat  Vascular: [ ]  Pain in legs with walking, [ ]  Pain in legs at rest, [ ]  Pain in legs at night,  [x ] Non-healing ulcer, [ ]  Blood clot in vein/DVT,   Pulmonary: [ ]  Home oxygen, [ ]  Productive cough, [ ]  Coughing up blood, [ ]  Asthma,  [ ]  Wheezing [ ]  COPD Musculoskeletal:  [ ]  Arthritis, [ ]  Low back pain, [ ]  Joint pain Hematologic: [ ]  Easy Bruising, [ ]  Anemia; [ ]  Hepatitis Gastrointestinal: [ ]  Blood in stool, [ ]  Gastroesophageal Reflux/heartburn, Urinary: [ ]  chronic Kidney disease, [x ] on HD - [ ]  MWF or [ ]  TTHS, [ ]  Burning with urination, [ ]  Difficulty urinating Skin: [ ]  Rashes, [x ] Wounds Psychological: [ ]  Anxiety, [ ]  Depression  Physical Examination Vitals:   04/15/19 0210 04/15/19 0408 04/15/19 0512 04/15/19 0832  BP: 118/71 (!) 91/42 (!) 84/48   Pulse: 91 82 81 76  Resp: 15 (!) 23 (!) 24 20  Temp: 98.3 F (36.8 C) 98 F (36.7 C) 97.7 F (36.5 C) (!) 97.5 F (36.4 C)  TempSrc: Oral Oral Oral Oral  SpO2: 100% 99% 100% 100%  Weight:  77.2 kg    Height:       Body mass index is 25.88 kg/m.  General:  WDWN in NAD Gait: Normal HENT: WNL Eyes: Pupils equal Pulmonary: normal non-labored breathing , without Rales, rhonchi,  wheezing Cardiac: RRR, without  Murmurs, rubs or gallops; No carotid bruits Abdomen: soft, NT, no masses Skin: no rashes, ulcers noted;  no Gangrene , no  cellulitis; no open wounds;   Vascular Exam/Pulses:left stump warm non tender to palpation without drainage or erythema.          Musculoskeletal: no muscle wasting or atrophy; no edema  Neurologic: A&O X 3; Appropriate Affect ;  SENSATION: normal; MOTOR FUNCTION: 5/5 Symmetric Speech is fluent/normal   Significant Diagnostic Studies: CBC Lab Results  Component Value Date   WBC 5.8 04/14/2019   HGB 12.9 (L) 04/14/2019   HCT 44.2 04/14/2019   MCV 101.6 (H) 04/14/2019   PLT 170 04/14/2019    BMET    Component Value Date/Time   NA 137 04/14/2019 2311   K 4.6 04/14/2019 2311   CL 106 04/14/2019 2311   CO2 18 (L) 04/14/2019 2311   GLUCOSE 77 04/14/2019 2311   BUN 24 (H) 04/14/2019 2311   CREATININE 5.78 (H) 04/14/2019 2311   CREATININE 9.49 (HH) 05/11/2018 1219   CALCIUM 7.7 (L) 04/14/2019 2311   GFRNONAA 9 (L) 04/14/2019 2311   GFRNONAA 5 (L) 05/11/2018 1219   GFRAA 10 (L) 04/14/2019 2311   GFRAA 6 (L) 05/11/2018 1219   Estimated Creatinine Clearance: 11.2 mL/min (A) (by C-G formula based on SCr of 5.78 mg/dL (H)).  COAG Lab Results  Component Value Date   INR 1.2 04/15/2019  INR 1.2 03/15/2019   INR 1.2 03/12/2019       ASSESSMENT/PLAN:  Left BKA with minimal incisional separation.  No erythema, edema or drainage.   Plan wet to dry dressing daily to left stump until healed.  Patient tolerated this well.  We will hold off on shrinker sock until the incision is healed.  Right LE s/p atherectomy of the right tibioperoneal trunk as well as balloon angioplasty of the tibioperoneal trunk and PT artery by Dr. Donzetta Matters. with healing right lateral foot wound s/p amputation by Hardie Pulley DPM.   Roxy Horseman 04/15/2019 12:06 PM I agree with the above.  I have seen and evaluated to the patient.  Left BKA with slight incision separation.  No intervention needed other than daily dressing changes.  Podiatry following right foot.   [place]

## 2019-04-15 NOTE — ED Notes (Signed)
Stuck pt.x2 no blood return 

## 2019-04-15 NOTE — Consult Note (Signed)
[place] Nurse wound consult note Patient receiving care in [facility] 4N11.   Reason for Consult: "skin ulcer on the left foot" Dr. Norton Blizzard, Podiatrist, is assuming care of the RIGHT 5th metatarsal head area wound.  Patient has a left leg amputation.  WOC is signing off. I met [name] going into the room just as I was, and he explained he will directing care. Val Riles, RN, MSN, CWOCN, CNS-BC, pager [phone]

## 2019-04-16 ENCOUNTER — Inpatient Hospital Stay (HOSPITAL_COMMUNITY): Payer: Medicare Other

## 2019-04-16 DIAGNOSIS — T8130XA Disruption of wound, unspecified, initial encounter: Secondary | ICD-10-CM

## 2019-04-16 DIAGNOSIS — Z89512 Acquired absence of left leg below knee: Secondary | ICD-10-CM

## 2019-04-16 DIAGNOSIS — K8309 Other cholangitis: Secondary | ICD-10-CM

## 2019-04-16 LAB — COMPREHENSIVE METABOLIC PANEL
ALT: 31 U/L (ref 0–44)
AST: 38 U/L (ref 15–41)
Albumin: 2.9 g/dL — ABNORMAL LOW (ref 3.5–5.0)
Alkaline Phosphatase: 62 U/L (ref 38–126)
Anion gap: 14 (ref 5–15)
BUN: 14 mg/dL (ref 8–23)
CO2: 23 mmol/L (ref 22–32)
Calcium: 8.7 mg/dL — ABNORMAL LOW (ref 8.9–10.3)
Chloride: 96 mmol/L — ABNORMAL LOW (ref 98–111)
Creatinine, Ser: 4.03 mg/dL — ABNORMAL HIGH (ref 0.61–1.24)
GFR calc Af Amer: 16 mL/min — ABNORMAL LOW (ref >60)
GFR calc non Af Amer: 14 mL/min — ABNORMAL LOW (ref >60)
Glucose, Bld: 71 mg/dL (ref 70–99)
Potassium: 3.1 mmol/L — ABNORMAL LOW (ref 3.5–5.1)
Sodium: 133 mmol/L — ABNORMAL LOW (ref 135–145)
Total Bilirubin: 0.7 mg/dL (ref 0.3–1.2)
Total Protein: 6.7 g/dL (ref 6.5–8.1)

## 2019-04-16 LAB — HEPATITIS PANEL, ACUTE
HCV Ab: <0.1 s/co ratio (ref 0.0–0.9)
Hep A IgM: NEGATIVE
Hep B C IgM: NEGATIVE
Hepatitis B Surface Ag: NEGATIVE

## 2019-04-16 LAB — CBC
HCT: 34.3 % — ABNORMAL LOW (ref 39.0–52.0)
Hemoglobin: 10.3 g/dL — ABNORMAL LOW (ref 13.0–17.0)
MCH: 29.6 pg (ref 26.0–34.0)
MCHC: 30 g/dL (ref 30.0–36.0)
MCV: 98.6 fL (ref 80.0–100.0)
Platelets: 180 10*3/uL (ref 150–400)
RBC: 3.48 MIL/uL — ABNORMAL LOW (ref 4.22–5.81)
RDW: 16.9 % — ABNORMAL HIGH (ref 11.5–15.5)
WBC: 4.2 10*3/uL (ref 4.0–10.5)
nRBC: 0 % (ref 0.0–0.2)

## 2019-04-16 LAB — TROPONIN I (HIGH SENSITIVITY): Troponin I (High Sensitivity): 114 ng/L (ref <18)

## 2019-04-16 MED ORDER — POTASSIUM CHLORIDE 20 MEQ PO PACK
20.0000 meq | PACK | Freq: Once | ORAL | Status: AC
  Administered 2019-04-16: 20 meq via ORAL
  Filled 2019-04-16: qty 1

## 2019-04-16 MED ORDER — FLEET ENEMA 7-19 GM/118ML RE ENEM
1.0000 | ENEMA | Freq: Once | RECTAL | Status: AC
  Administered 2019-04-16: 1 via RECTAL
  Filled 2019-04-16: qty 1

## 2019-04-16 MED ORDER — SACCHAROMYCES BOULARDII 250 MG PO CAPS
250.0000 mg | ORAL_CAPSULE | Freq: Two times a day (BID) | ORAL | Status: DC
  Administered 2019-04-16 – 2019-04-17 (×2): 250 mg via ORAL
  Filled 2019-04-16 (×2): qty 1

## 2019-04-16 NOTE — Progress Notes (Signed)
0215 Enema given to patient. Pt disimpacted by RN. Pt had hard, stone like balls of stool present. Pt tolerated well.  Will pass on to dayshift RN for daily laxative per pt request.

## 2019-04-16 NOTE — Progress Notes (Signed)
Liver chemistries and white count remain normal and the patient remains afebrile.  Patient had good results from an enema last night, and reportedly had a soft bowel movement (nonbloody) today.  Patient is tolerating his diet well and is without abdominal pain or nausea.  Impression:  Acute episode of altered mental status and apparent sepsis of unclear cause, with associated biliary tract findings of uncertain clinical significance.  Recommendation:  1.  Tentative plan for discharge tomorrow noted.  Okay from my standpoint.  Would probably recommend a 5-day course of post discharge antibiotics (Augmentin?)  2.  In patients on antibiotics, I favor concurrent treatment with a probiotic.  I have ordered Florastor (Saccharomyces boulardii) 1 capsule twice a day while he is in-house and it would be reasonable to continue this for a week or 2 post discharge (available over-the-counter)  3.  Would instruct patient to contact Dr. Michail Sermon to make a follow-up visit at his office for sometime in the next several weeks.  Cleotis Nipper, M.D. Pager [phone] If no answer or after 5 PM call [phone]

## 2019-04-16 NOTE — Progress Notes (Signed)
  Echocardiogram 2D Echocardiogram has been performed.  Timothy Faulkner F 04/16/2019, 9:23 AM

## 2019-04-16 NOTE — Progress Notes (Signed)
PROGRESS NOTE    Timothy Faulkner  QPY:[id number]  DOB: [birth year]  DOA: 04/14/2019 PCP: [name], MD  Brief Narrative:  73 y.o. 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 Pt noted to be febrile in ED (isolated temp 101.4)  and hypotensive,lowest BP recorded at 79/47 (has chronic hypotension as well)  but oriented x 3. Has been going to HD regularly. On ED work up found to have gallstones on ultrasound with a dilated common bile duct of 12 mm diameter in the setting of elevated liver chemistries (AST 74, compared to baseline of 17 a month earlier; ALT 29 compared to 6 a month earlier; bilirubin 1.3 compared to 0.9 a month earlier).  Patient admitted with empiric antibiotics and GI consultation. GI feels patient might have passed a stone /developed mild cholangitis in the process which could have caused fevers. They recommend antibiotic course with plans for outpatient EUS.  Patient also seen by Podiatry, Dr March Rummage, as courtesy who felt wound was well appearing without signs of infection-Dressed with aquacell and dry dressing.  Subjective:  Patient undergoing bedside echo.  Denies any abdominal pain.  Has dry dressing in place along the right foot.  Status post left BKA.  Afebrile overnight.   Objective: Vitals:   04/15/19 2330 04/16/19 0023 04/16/19 0330 04/16/19 0817  BP: (!) 84/39 91/75 121/82 111/74  Pulse: 74 91 93 80  Resp: 14  17 20   Temp: 97.9 F (36.6 C)  98 F (36.7 C) 98.3 F (36.8 C)  TempSrc: Oral  Oral Oral  SpO2: 99% 100% 99% 100%  Weight:      Height:        Intake/Output Summary  (Last 24 hours) at 04/16/2019 1021 Last data filed at 04/15/2019 2128 Gross per 24 hour  Intake 1260.3 ml  Output 1300 ml  Net -39.7 ml   Filed Weights   04/14/19 2104 04/15/19 0408 04/15/19 1720  Weight: 72.1 kg 77.2 kg 72.4 kg    Physical Examination:  General exam: Appears calm and comfortable  Respiratory system: Clear to auscultation. Respiratory effort normal. Cardiovascular system: S1 & S2 heard, RRR. No JVD, murmurs, rubs, gallops or clicks. No pedal edema. Gastrointestinal system: Abdomen is nondistended, soft and nontender. No organomegaly or masses felt. Normal bowel sounds heard. Central nervous system: Alert and oriented. No focal neurological deficits. Extremities: Status post toe amputations on right foot.  Dry dressing on the foot per podiatry.  Status post left BKA Skin: No rashes, lesions or ulcers Psychiatry: Judgement and insight appear normal. Mood & affect appropriate.         Data Reviewed: I have personally reviewed following labs and imaging studies  CBC: Recent Labs  Lab 04/14/19 1920 04/16/19 0536  WBC 5.8 4.2  HGB 12.9* 10.3*  HCT 44.2 34.3*  MCV 101.6* 98.6  PLT 170 099   Basic Metabolic Panel: Recent Labs  Lab 04/14/19 1920 04/14/19 2311 04/16/19 0536  NA 133* 137 133*  K 5.6* 4.6 3.1*  CL 95* 106 96*  CO2 15* 18* 23  GLUCOSE 164* 77 71  BUN 26* 24* 14  CREATININE 7.41* 5.78* 4.03*  CALCIUM 10.1 7.7* 8.7*   GFR: Estimated Creatinine Clearance: 16 mL/min (A) (by C-G formula based on SCr of 4.03 mg/dL (H)). Liver Function Tests: Recent Labs  Lab 04/14/19 1920 04/15/19 1526 04/16/19 0536  AST 74* 39 38  ALT 29 25 31   ALKPHOS 78 64 62  BILITOT 1.3* 0.7 0.7  PROT 8.1 6.5 6.7  ALBUMIN 3.5 2.8* 2.9*   Recent Labs  Lab 04/15/19 0542  LIPASE 22   No results for input(s): AMMONIA in the last 168 hours. Coagulation Profile: Recent Labs  Lab 04/15/19 0632  INR 1.2   Cardiac Enzymes: Recent Labs  Lab 04/15/19 0542   CKTOTAL 49  CKMB 1.4   BNP (last 3 results) No results for input(s): PROBNP in the last 8760 hours. HbA1C: No results for input(s): HGBA1C in the last 72 hours. CBG: Recent Labs  Lab 04/14/19 1918 04/15/19 0209  GLUCAP 158* 74   Lipid Profile: No results for input(s): CHOL, HDL, LDLCALC, TRIG, CHOLHDL, LDLDIRECT in the last 72 hours. Thyroid Function Tests: No results for input(s): TSH, T4TOTAL, FREET4, T3FREE, THYROIDAB in the last 72 hours. Anemia Panel: No results for input(s): VITAMINB12, FOLATE, FERRITIN, TIBC, IRON, RETICCTPCT in the last 72 hours. Sepsis Labs: Recent Labs  Lab 04/14/19 1921 04/15/19 0109  LATICACIDVEN 8.6* 1.6    Recent Results (from the past 240 hour(s))  Blood culture (routine x 2)     Status: None (Preliminary result)   Collection Time: 04/14/19  7:38 PM   Specimen: BLOOD  Result Value Ref Range Status   Specimen Description BLOOD BLOOD RIGHT FOREARM  Final   Special Requests   Final    BOTTLES DRAWN AEROBIC AND ANAEROBIC Blood Culture results may not be optimal due to an excessive volume of blood received in culture bottles   Culture   Final    NO GROWTH < 24 HOURS Performed at [facility], [place] [address] [place], [place] [address]    Report Status PENDING  Incomplete  Blood culture (routine x 2)     Status: None (Preliminary result)   Collection Time: 04/14/19  8:01 PM   Specimen: BLOOD RIGHT HAND  Result Value Ref Range Status   Specimen Description BLOOD RIGHT HAND  Final   Special Requests   Final    BOTTLES DRAWN AEROBIC ONLY Blood Culture adequate volume   Culture   Final    NO GROWTH < 24 HOURS Performed at [facility], [place] [address], [place], [place] [address]    Report Status PENDING  Incomplete  SARS Coronavirus 2 [facility] order, Performed in [facility] hospital lab) Nasopharyngeal Nasopharyngeal Swab     Status: None   Collection Time: 04/14/19  8:08 PM   Specimen: Nasopharyngeal Swab  Result Value Ref  Range Status   SARS Coronavirus 2 NEGATIVE NEGATIVE Final    Comment: (NOTE) If result is NEGATIVE SARS-CoV-2 target nucleic acids are NOT DETECTED. The SARS-CoV-2 RNA is generally detectable in upper and lower  respiratory specimens during the acute phase of infection. The lowest  concentration of SARS-CoV-2 viral copies this assay can detect is 250  copies / mL. A negative result does not preclude SARS-CoV-2 infection  and should not be used as the sole basis for treatment or other  patient management decisions.  A negative result may occur with  improper specimen collection / handling, submission of specimen other  than nasopharyngeal swab, presence of viral mutation(s) within the  areas targeted by this assay, and inadequate number of viral copies  (<250 copies / mL). A negative result must be combined with clinical  observations, patient history, and epidemiological information. If result is POSITIVE SARS-CoV-2 target nucleic acids are DETECTED. The SARS-CoV-2 RNA is generally detectable in upper and lower  respiratory specimens dur ing the acute phase of infection.  Positive  results are indicative of active infection with SARS-CoV-2.  Clinical  correlation with patient history and other diagnostic information is  necessary to determine patient infection status.  Positive results do  not rule out bacterial infection or co-infection with other viruses. If result is PRESUMPTIVE POSTIVE SARS-CoV-2 nucleic acids MAY BE PRESENT.   A presumptive positive result was obtained on the submitted specimen  and confirmed on repeat testing.  While 2019 novel coronavirus  (SARS-CoV-2) nucleic acids may be present in the submitted sample  additional confirmatory testing may be necessary for epidemiological  and / or clinical management purposes  to differentiate between  SARS-CoV-2 and other Sarbecovirus currently known to infect humans.  If clinically indicated additional testing with an  alternate test  methodology [phone]) is advised. The SARS-CoV-2 RNA is generally  detectable in upper and lower respiratory sp ecimens during the acute  phase of infection. The expected result is Negative. Fact Sheet for Patients:  StrictlyIdeas.no Fact Sheet for Healthcare Providers: BankingDealers.co.za This test is not yet approved or cleared by the [place] FDA and has been authorized for detection and/or diagnosis of SARS-CoV-2 by FDA under an Emergency Use Authorization (EUA).  This EUA will remain in effect (meaning this test can be used) for the duration of the COVID-19 declaration under Section 564(b)(1) of the Act, 21 U.S.C. section 360bbb-3(b)(1), unless the authorization is terminated or revoked sooner. Performed at [facility], [place] [address], [place], [place] [address]   MRSA PCR Screening     Status: None   Collection Time: 04/15/19  2:20 AM   Specimen: Nasal Mucosa; Nasopharyngeal  Result Value Ref Range Status   MRSA by PCR NEGATIVE NEGATIVE Final    Comment:        The GeneXpert MRSA Assay (FDA approved for NASAL specimens only), is one component of a comprehensive MRSA colonization surveillance program. It is not intended to diagnose MRSA infection nor to guide or monitor treatment for MRSA infections. Performed at [facility], [place] [address], [place], [place] [address]       Radiology Studies: Ct Head Wo Contrast  Result Date: 04/14/2019 CLINICAL DATA:  Altered mental status. EXAM: CT HEAD WITHOUT CONTRAST TECHNIQUE: Contiguous axial images were obtained from the base of the skull through the vertex without intravenous contrast. COMPARISON:  Head CT 06/10/2018 FINDINGS: Brain: No intracranial hemorrhage, mass effect, or midline shift. Unchanged degree of atrophy and chronic small vessel ischemia. No hydrocephalus. The basilar cisterns are patent. No evidence of territorial infarct or acute  ischemia. No extra-axial or intracranial fluid collection. Vascular: Dense atherosclerosis of skullbase vasculature without hyperdense vessel or abnormal calcification. Skull: No fracture or focal lesion. Sinuses/Orbits: Mucous retention cyst in right maxillary greater than left sinus. No acute findings. Bilateral cataract resection. Other: None. IMPRESSION: 1. No acute intracranial abnormality. 2. Unchanged atrophy and chronic small vessel ischemia. Electronically Signed   By: Keith Rake M.D.   On: 04/14/2019 23:58   Dg Chest Port 1 View  Result Date: 04/14/2019 CLINICAL DATA:  Patient with sepsis. EXAM: PORTABLE CHEST 1 VIEW COMPARISON:  Chest radiograph 04/22/2018  FINDINGS: Central venous catheter tip projects over the right atrium. Stable single lead AICD device overlying the left hemithorax. Monitoring leads overlie the patient. Stable cardiac and mediastinal contours. Low lung volumes. Bibasilar atelectasis. No pleural effusion or pneumothorax. IMPRESSION: Low lung volumes with basilar atelectasis. Electronically Signed   By: Lovey Newcomer M.D.   On: 04/14/2019 20:27   US Abdomen Limited Ruq  Result Date: 04/15/2019 CLINICAL DATA:  Abnormal liver function tests. EXAM: ULTRASOUND ABDOMEN LIMITED RIGHT UPPER QUADRANT COMPARISON:  None. FINDINGS: Gallbladder: Gallbladder is moderately distended to 3.3 cm. Mild gallbladder wall thickening to 6 mm. No pericholecystic fluid. Several small gallstones collect dependently within the gallbladder. Negative sonographic Murphy's sign. Common bile duct: Diameter: Dilated to 12 mm. Liver: No focal lesion identified. Within normal limits in parenchymal echogenicity. Portal vein is patent on color Doppler imaging with normal direction of blood flow towards the liver. Other: Lobular echogenic RIGHT kidney. IMPRESSION: 1. Mild gallbladder distension with gallstones. No evidence acute cholecystitis. 2. Dilated common bile duct. With above noted gallstones, concern for  choledocholithiasis. Consider MRCP for further evaluation. Electronically Signed   By: Suzy Bouchard M.D.   On: 04/15/2019 10:28        Scheduled Meds: . aspirin EC  81 mg Oral Daily  . calcitRIOL  0.5 mcg Oral Q M,W,F-HD  . Chlorhexidine Gluconate Cloth  6 each Topical Q0600  . cinacalcet  30 mg Oral Q48H  . clopidogrel  75 mg Oral Q breakfast  . collagenase   Topical Daily  . docusate sodium  200 mg Oral BID  . feeding supplement (PRO-STAT SUGAR FREE 64)  30 mL Oral BID  . ferric citrate  420 mg Oral TID WC  . heparin  5,000 Units Subcutaneous Q8H  . midodrine  10 mg Oral Q M,W,F-HD  . multivitamin  1 tablet Oral QHS  . rosuvastatin  10 mg Oral q1800  . sodium chloride flush  3 mL Intravenous Once   Continuous Infusions: . ceFEPime (MAXIPIME) IV 1 g (04/15/19 2128)  . [START ON 04/18/2019] vancomycin 750 mg (04/15/19 1617)    Assessment & Plan:    1.  Sepsis syndrome (fevers/tachycardia/hypotension) POA: Unclear source.  Seen by podiatry and vascular surgery for foot wound as well as mild incision separation at left BKA stump.  Non-concerning for acute infection.  Seen by GI for dilated CBD noted at 12 mm on ultrasonogram with mildly distended gallbladder/gallstones.  Patient could not undergo MRCP due to AICD.  Seen by GI who recommended empiric treatment for cholangitis as he might have passed a stone.  His labs appear to be improving with IV fluids/antibiotics.    2.  Reported altered mental status at home: Not present on ED evaluation or at admission.  Not sure if he could have had metabolic encephalopathy in the setting of problem #1 and problem #6.  Now resolved and back to baseline.  3.  Dilated CBD/cholelithiasis/possible cholangitis:GI recommended outpatient follow-up for endoscopic ultrasonogram to rule out structures/ampullary mass/retained stone which could be contributing to CBD dilatation.  Patient does not have any pain and is not significantly jaundiced.  Will  continue IV antibiotics through today and if remains afebrile plan to transition to oral antibiotics and discharge home in a.m.  4.  Peripheral arterial disease, status post left BKA, incision separation:   Patient underwent left BKA on 8/4 by Dr. Oneida Alar.He was last seen on 04/12/19 to remove staples and start prothesis stump sinkers. He tolerated the staple remove  and the stump was well healed at the time of his last visit.    Since then, daughter reported trauma to the stump when patient was trying to get out of the car.Seen by vascular surgery Dr. Trula Slade on 9/4 for follow-up given concern for slight incision separation noted on admission.  Vascular surgery did not feel any acute intervention needed other than daily dressing changes.  Continue statin/Plavix  5.  ESRD: Resume hemodialysis Monday Wednesday Friday.  Seen by nephrology.  Patient refusing Sensipar/iron supplements.  6.  Chronic hypotension:He reports his BP runs anywhere from 75 to 100. Cortisol ok. f/u echo, resumed Midodrin which he takes on dialysis days.  7.  Abnormal troponin: No complaints of chest pain but patient was tachycardic in the ED (appears to be sinus tachycardia).  Likely mild elevation in troponin due to hypotension/ESRD and of unclear significance.  He does have significant cardiomyopathy at baseline and is status post AICD.  Repeat echo being done, will follow-up results.  8.  Right foot wound: Appreciate podiatry evaluation and recommendations of Santyl WTD dressing daily by nursing  9.  Anemia of chronic renal disease, continue Auryxia, hemoglobin overall stable  10.  Diabetes mellitus: Not on medications at home.  Sliding scale insulin  11. Hypokalemia: Replace cautiously as required treatment for mild hyperkalemia on presentation.  DVT prophylaxis: Heparin Code Status: Full code Family / Patient Communication: Discussed with patient and all questions answered to satisfaction. Disposition Plan: Home likely in  a.m. replace potassium, follow-up echo     LOS: 2 days    Time spent: 35 minutes    Guilford Shi, MD [facility] Pager [phone]  If 7PM-7AM, please contact night-coverage [email or web address] Password [email or web address] 04/16/2019, 10:21 AM

## 2019-04-16 NOTE — Progress Notes (Addendum)
[facility] Progress Note   Subjective:  Seen in room. No CP/dyspnea. Completed HD yesterday without any issues - 1.3L UF. Reviewed GI consult note regarding dilated bile duct.  Objective Vitals:   04/15/19 2330 04/16/19 0023 04/16/19 0330 04/16/19 0817  BP: (!) 84/39 91/75 121/82 111/74  Pulse: 74 91 93 80  Resp: 14  17 20   Temp: 97.9 F (36.6 C)  98 F (36.7 C) 98.3 F (36.8 C)  TempSrc: Oral  Oral Oral  SpO2: 99% 100% 99% 100%  Weight:      Height:       Physical Exam General: Well appearing man, NAD Heart: RRR; no murmur Lungs: CTA Abdomen: soft, non-tender Extremities: L BKA, mult R toe amputations. No edema. Dialysis Access:  [facility]  Additional Objective Labs: Basic Metabolic Panel: Recent Labs  Lab 04/14/19 1920 04/14/19 2311 04/16/19 0536  NA 133* 137 133*  K 5.6* 4.6 3.1*  CL 95* 106 96*  CO2 15* 18* 23  GLUCOSE 164* 77 71  BUN 26* 24* 14  CREATININE 7.41* 5.78* 4.03*  CALCIUM 10.1 7.7* 8.7*   Liver Function Tests: Recent Labs  Lab 04/14/19 1920 04/15/19 1526 04/16/19 0536  AST 74* 39 38  ALT 29 25 31   ALKPHOS 78 64 62  BILITOT 1.3* 0.7 0.7  PROT 8.1 6.5 6.7  ALBUMIN 3.5 2.8* 2.9*   Recent Labs  Lab 04/15/19 0542  LIPASE 22   CBC: Recent Labs  Lab 04/14/19 1920 04/16/19 0536  WBC 5.8 4.2  HGB 12.9* 10.3*  HCT 44.2 34.3*  MCV 101.6* 98.6  PLT 170 180   Cardiac Enzymes: Recent Labs  Lab 04/15/19 0542  CKTOTAL 49  CKMB 1.4   CBG: Recent Labs  Lab 04/14/19 1918 04/15/19 0209  GLUCAP 158* 74   Studies/Results: Ct Head Wo Contrast  Result Date: 04/14/2019 CLINICAL DATA:  Altered mental status. EXAM: CT HEAD WITHOUT CONTRAST TECHNIQUE: Contiguous axial images were obtained from the base of the skull through the vertex without intravenous contrast. COMPARISON:  Head CT 06/10/2018 FINDINGS: Brain: No intracranial hemorrhage, mass effect, or midline shift. Unchanged degree of atrophy and chronic small vessel  ischemia. No hydrocephalus. The basilar cisterns are patent. No evidence of territorial infarct or acute ischemia. No extra-axial or intracranial fluid collection. Vascular: Dense atherosclerosis of skullbase vasculature without hyperdense vessel or abnormal calcification. Skull: No fracture or focal lesion. Sinuses/Orbits: Mucous retention cyst in right maxillary greater than left sinus. No acute findings. Bilateral cataract resection. Other: None. IMPRESSION: 1. No acute intracranial abnormality. 2. Unchanged atrophy and chronic small vessel ischemia. Electronically Signed   By: Keith Rake M.D.   On: 04/14/2019 23:58   Dg Chest Port 1 View  Result Date: 04/14/2019 CLINICAL DATA:  Patient with sepsis. EXAM: PORTABLE CHEST 1 VIEW COMPARISON:  Chest radiograph 04/22/2018 FINDINGS: Central venous catheter tip projects over the right atrium. Stable single lead AICD device overlying the left hemithorax. Monitoring leads overlie the patient. Stable cardiac and mediastinal contours. Low lung volumes. Bibasilar atelectasis. No pleural effusion or pneumothorax. IMPRESSION: Low lung volumes with basilar atelectasis. Electronically Signed   By: Lovey Newcomer M.D.   On: 04/14/2019 20:27   US Abdomen Limited Ruq  Result Date: 04/15/2019 CLINICAL DATA:  Abnormal liver function tests. EXAM: ULTRASOUND ABDOMEN LIMITED RIGHT UPPER QUADRANT COMPARISON:  None. FINDINGS: Gallbladder: Gallbladder is moderately distended to 3.3 cm. Mild gallbladder wall thickening to 6 mm. No pericholecystic fluid. Several small gallstones collect dependently within the gallbladder.  Negative sonographic Murphy's sign. Common bile duct: Diameter: Dilated to 12 mm. Liver: No focal lesion identified. Within normal limits in parenchymal echogenicity. Portal vein is patent on color Doppler imaging with normal direction of blood flow towards the liver. Other: Lobular echogenic RIGHT kidney. IMPRESSION: 1. Mild gallbladder distension with gallstones.  No evidence acute cholecystitis. 2. Dilated common bile duct. With above noted gallstones, concern for choledocholithiasis. Consider MRCP for further evaluation. Electronically Signed   By: Suzy Bouchard M.D.   On: 04/15/2019 10:28   Medications: . ceFEPime (MAXIPIME) IV 1 g (04/15/19 2128)  . [START ON 04/18/2019] vancomycin 750 mg (04/15/19 1617)   . aspirin EC  81 mg Oral Daily  . calcitRIOL  0.5 mcg Oral Q M,W,F-HD  . Chlorhexidine Gluconate Cloth  6 each Topical Q0600  . cinacalcet  30 mg Oral Q48H  . clopidogrel  75 mg Oral Q breakfast  . collagenase   Topical Daily  . docusate sodium  200 mg Oral BID  . feeding supplement (PRO-STAT SUGAR FREE 64)  30 mL Oral BID  . ferric citrate  420 mg Oral TID WC  . heparin  5,000 Units Subcutaneous Q8H  . midodrine  10 mg Oral Q M,W,F-HD  . multivitamin  1 tablet Oral QHS  . rosuvastatin  10 mg Oral q1800  . sodium chloride flush  3 mL Intravenous Once   Dialysis Orders: [facility] MWF 4hr, 400/800, 3K/2.5Ca bath, EDW 73kg, TDC, heparin 5000 + 2000 mid-run bolus - Mircera 120mcg q 2 weeks (ordered - not given yet. Typically Hgb > 13) - Calcitriol 0.59mcg PO q HD  Assessment/Plan: 1. Sepsis(?): Febrile and hypotensive on admit - improved. BCx 9/3 negative. COVID negative, CXR clear. On Vanc/Cefepime. 2. ESRD: Continue HD per MWF schedule - next 9/7. K low this am, getting 1 dose KCl 71mEq. 3. HypoTN/volume: Uses midodrine for BP support on dialysis. Close to EDW. 4. Anemia: Hgb 10.3 - will give Aranesp with next HD. 5. Secondary hyperparathyroidism: Ca ok, continue home binders Lorin Picket) + VDRA. 6. Nutrition: Alb low, continue pro-stat supps. 7. Hx AICD 8. L BKA with mild wound dehiscence: VVS consulted; no surgical intervention needed - wound care. 9. Constipation: Given 1 dose Fleet enema this AM, watch this in future d/t ^ Phos load. 10. Dilated bile duct + gallstones: Surgery consulted, watch for now with outpatient EUS. No surgery  planned.   Veneta Penton, PA-C 04/16/2019, 10:48 AM  [facility] Pager: [phone]  Pt seen, examined and agree w A/P as above.  [name]  MD 04/16/2019, 12:46 PM

## 2019-04-17 LAB — ECHOCARDIOGRAM COMPLETE
Height: 68 in
Weight: 2553.81 oz

## 2019-04-17 LAB — BASIC METABOLIC PANEL
Anion gap: 15 (ref 5–15)
BUN: 23 mg/dL (ref 8–23)
CO2: 22 mmol/L (ref 22–32)
Calcium: 9.4 mg/dL (ref 8.9–10.3)
Chloride: 97 mmol/L — ABNORMAL LOW (ref 98–111)
Creatinine, Ser: 5.5 mg/dL — ABNORMAL HIGH (ref 0.61–1.24)
GFR calc Af Amer: 11 mL/min — ABNORMAL LOW (ref >60)
GFR calc non Af Amer: 10 mL/min — ABNORMAL LOW (ref >60)
Glucose, Bld: 67 mg/dL — ABNORMAL LOW (ref 70–99)
Potassium: 3.4 mmol/L — ABNORMAL LOW (ref 3.5–5.1)
Sodium: 134 mmol/L — ABNORMAL LOW (ref 135–145)

## 2019-04-17 MED ORDER — AMOXICILLIN-POT CLAVULANATE 875-125 MG PO TABS: 1.0000 | ORAL_TABLET | Freq: Two times a day (BID) | ORAL | 0 refills | Status: AC

## 2019-04-17 MED ORDER — POTASSIUM CHLORIDE 20 MEQ PO PACK
20.0000 meq | PACK | Freq: Once | ORAL | Status: AC
  Administered 2019-04-17: 20 meq via ORAL
  Filled 2019-04-17: qty 1

## 2019-04-17 MED ORDER — AURYXIA 1 GM 210 MG(FE) PO TABS: 420.0000 mg | ORAL_TABLET | Freq: Three times a day (TID) | ORAL | 0 refills | Status: AC

## 2019-04-17 MED ORDER — SACCHAROMYCES BOULARDII 250 MG PO CAPS: 250.0000 mg | ORAL_CAPSULE | Freq: Two times a day (BID) | ORAL | 0 refills | Status: AC

## 2019-04-17 MED ORDER — BISACODYL 5 MG PO TBEC: 5.0000 mg | DELAYED_RELEASE_TABLET | Freq: Every day | ORAL | 0 refills | Status: AC | PRN

## 2019-04-17 NOTE — ED Provider Notes (Signed)
[facility] PROGRESSIVE CARE Provider Note   CSN: [id number] Arrival date & time: 04/14/19  1913     History   Chief Complaint Chief Complaint  Patient presents with  . Altered Mental Status    HPI Timothy Faulkner is a 73 y.o. male.     HPI Patient brought from home for change in mental status.  He had just been seen today for a recheck of wound on his right foot and left BKA done last week.  Patient is regularly on dialysis with last dialysis yesterday.  Family reported patient is confused and altered level consciousness.  On EMS arrival patient was hypotensive and lethargic.  On arrival he is found to be febrile.  On my first assessment of the patient he was not a very good historian confused but airway protected and arousable.  After resuscitation he became more alert and able to give additional history.  He did not have any pain complaints.  He did become talkative but did not have much to add in terms of noting any change in his mental status or becoming sick recently. Past Medical History:  Diagnosis Date  . Anemia   . Arthritis    Gout  . Automatic implantable cardioverter-defibrillator in situ    [employer]  . CHF   . ESRD on dialysis [facility])    [name] 03/11/2013 (03/11/2013) dialysis M/W/F  . Gangrene ([place])    left foot  . GERD (gastroesophageal reflux disease)   . Gout    "once a year"  . Heart murmur   . HYPERCHOLESTEROLEMIA, MIXED   . HYPERTENSION   . Macular degeneration   . Osteomyelitis ([place])    right foot  . 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 Pacemaker   . Peripheral arterial disease (HCC)    left fifth toe ulcer, healing  . Pneumonia   . Shortness of breath   . 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 Wears dentures   . Wears glasses     Patient Active Problem List   Diagnosis Date Noted  . Sepsis ([place]) 04/14/2019  . Drug induced constipation   . Anemia of chronic disease   . ESRD  on dialysis ([place])   . Postoperative pain   . Phantom limb pain ([place])   . S/P BKA (below knee amputation), left ([place]) 03/18/2019  . Unilateral complete BKA, left, initial encounter ([place]) 03/17/2019  . Critical lower limb ischemia 03/12/2019  . Hypotension 02/23/2019  . Cellulitis of left foot 02/23/2019  . Gangrene of toe of left foot ([place]) 02/22/2019  . 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 Ulcer of right foot with fat layer exposed ([place]) 09/02/2018  . Encounter for planned post-operative wound closure   . Acute osteomyelitis of metatarsal bone of right foot ([place])   . Diabetic ulcer of midfoot associated with diabetes mellitus due to underlying condition, with necrosis of bone ([place])   . Osteomyelitis of right foot ([place])   . Vascular calcification   . Cellulitis and abscess of foot, except toes   . Anemia associated with chronic renal failure 07/13/2018  . Cardiomyopathy, dilated, nonischemic ([place]) 07/13/2018  . Diabetic foot infection ([place]) 07/13/2018  . PAD (peripheral artery disease) ([place])   . Macular degeneration   . Chronic systolic heart failure ([place]) 08/22/2014  . Diabetic infection of right foot ([place]) 07/13/2014  . ESRD (end stage renal disease)  on dialysis ([place]) 11/11/2013  . Snoring 11/11/2013  . Unspecified sleep apnea 11/11/2013  . Other pancytopenia ([place]) 09/22/2013  . Hemoptysis 09/20/2013  . Pre-transplant evaluation for kidney transplant 01/11/2013  . Automatic implantable cardioverter-defibrillator in situ 10/01/2010  . HYPERCHOLESTEROLEMIA, MIXED 05/03/2010  . Essential hypertension 05/03/2010  . CHF 05/03/2010    Past Surgical History:  Procedure Laterality Date  . A/V FISTULAGRAM Left 07/20/2018   Procedure: A/V FISTULAGRAM;  Surgeon: [name], MD;  Location: [facility];  Service: Cardiovascular;  Laterality: Left;  . ABDOMINAL AORTOGRAM N/A 02/25/2019   Procedure: ABDOMINAL AORTOGRAM;  Surgeon: Elam Dutch, MD;   Location: [facility];  Service: Cardiovascular;  Laterality: N/A;  . ABDOMINAL AORTOGRAM W/LOWER EXTREMITY Bilateral 07/20/2018   Procedure: ABDOMINAL AORTOGRAM W/LOWER EXTREMITY;  Surgeon: [name], MD;  Location: [facility];  Service: Cardiovascular;  Laterality: Bilateral;  . AMPUTATION Right 09/07/2018   Procedure: Right fifth metatarsectomy;  Surgeon: [name], DPM;  Location: [facility];  Service: Podiatry;  Laterality: Right;  . AMPUTATION Left 03/08/2019   Procedure: AMPUTATION  3RD AND 4TH TOES LEFT FOOT;  Surgeon: [name], DPM;  Location: [facility];  Service: Podiatry;  Laterality: Left;  . AMPUTATION Left 03/15/2019   Procedure: AMPUTATION BELOW KNEE;  Surgeon: Elam Dutch, MD;  Location: [facility] OR;  Service: Vascular;  Laterality: Left;  . AV FISTULA PLACEMENT Right 12/13/2012   Procedure: ARTERIOVENOUS (AV) FISTULA CREATION;  Surgeon: [name], MD;  Location: [facility];  Service: Vascular;  Laterality: Right;  Ultrasound guided  . AV FISTULA PLACEMENT Left 05/07/2016   Procedure: LEFT RADIOCEPHALIC ARTERIOVENOUS (AV) FISTULA CREATION;  Surgeon: [name], MD;  Location: [facility];  Service: Vascular;  Laterality: Left;  . BASCILIC VEIN TRANSPOSITION Right 03/26/2016   Procedure: RIGHT BASILIC VEIN TRANSPOSITION;  Surgeon: [name], MD;  Location: [facility];  Service: Vascular;  Laterality: Right;  . CARDIAC CATHETERIZATION    . CARDIAC DEFIBRILLATOR PLACEMENT     Chemical engineer  . EYE SURGERY Bilateral    Cataract  . FISTULOGRAM Left 04/22/2018   Procedure: FISTULOGRAM UPPER EXTREMITY;  Surgeon: [name], MD;  Location: [facility];  Service: Vascular;  Laterality: Left;  . I&D EXTREMITY Right 07/15/2018   Procedure: IRRIGATION AND DEBRIDEMENT RIGHT FOOT;  Surgeon: [name], DPM;  Location: [facility];  Service: Podiatry;  Laterality: Right;  . INCISION AND DRAINAGE ABSCESS / HEMATOMA OF BURSA / KNEE / THIGH Left 2012   "knee" (03/11/2013)  .  INSERT / REPLACE / REMOVE PACEMAKER    . INSERTION OF DIALYSIS CATHETER Left 04/22/2018   Procedure: INSERTION OF DIALYSIS CATHETER;  Surgeon: [name], MD;  Location: [facility];  Service: Vascular;  Laterality: Left;  . IR FLUORO GUIDE CV LINE LEFT  07/15/2018  . IR PTA VENOUS EXCEPT DIALYSIS CIRCUIT  07/15/2018  . LOWER EXTREMITY ANGIOGRAPHY Right 07/21/2018   Procedure: LOWER EXTREMITY ANGIOGRAPHY;  Surgeon: [name], MD;  Location: [facility] LAB;  Service: Cardiovascular;  Laterality: Right;  . LOWER EXTREMITY ANGIOGRAPHY Bilateral 02/25/2019   Procedure: Lower Extremity Angiography;  Surgeon: Elam Dutch, MD;  Location: [facility];  Service: Cardiovascular;  Laterality: Bilateral;  . METATARSAL HEAD EXCISION Right 07/15/2018   Procedure: METATARSAL RESECTION;  Surgeon: [name], DPM;  Location: [facility];  Service: Podiatry;  Laterality: Right;  . MULTIPLE TOOTH EXTRACTIONS    .  PERIPHERAL VASCULAR ATHERECTOMY Right 02/28/2019   Procedure: PERIPHERAL VASCULAR ATHERECTOMY;  Surgeon: Waynetta Sandy, MD;  Location: [facility];  Service: Cardiovascular;  Laterality: Right;  right tp trunk   . PERIPHERAL VASCULAR BALLOON ANGIOPLASTY Left 02/25/2019   Procedure: PERIPHERAL VASCULAR BALLOON ANGIOPLASTY;  Surgeon: Elam Dutch, MD;  Location: [facility] CV LAB;  Service: Cardiovascular;  Laterality: Left;  tibial peroneal trunk and PT  . PERIPHERAL VASCULAR INTERVENTION Right 07/21/2018   Procedure: PERIPHERAL VASCULAR INTERVENTION;  Surgeon: [name], MD;  Location: [facility];  Service: Cardiovascular;  Laterality: Right;  peroneal stents  . REVISON OF ARTERIOVENOUS FISTULA Right 9/52/8413   Procedure: Plication OF Right Arm ARTERIOVENOUS FISTULA;  Surgeon: Elam Dutch, MD;  Location: [facility] OR;  Service: Vascular;  Laterality: Right;  . REVISON OF ARTERIOVENOUS FISTULA Left 04/22/2018   Procedure: REVISION OF  RADIOCEPHALIC ARTERIOVENOUS FISTULA;  Surgeon: [name], MD;  Location: [facility];  Service: Vascular;  Laterality: Left;  . SHUNTOGRAM N/A 05/31/2013   Procedure: Earney Mallet;  Surgeon: [name], MD;  Location: [facility] CATH LAB;  Service: Cardiovascular;  Laterality: N/A;  . UPPER EXTREMITY VENOGRAPHY Right 07/23/2018   Procedure: UPPER EXTREMITY VENOGRAPHY;  Surgeon: [name], MD;  Location: [facility];  Service: Cardiovascular;  Laterality: Right;  . WOUND DEBRIDEMENT Right 07/17/2018   Procedure: Wound Debridement; Closure Filleted toe flap Right Foot;  Surgeon: [name], DPM;  Location: [facility];  Service: Podiatry;  Laterality: Right;  . WOUND DEBRIDEMENT Right 09/07/2018   Procedure: Debridement Right Foot Wound, application of wound vac;  Surgeon: [name], DPM;  Location: [facility];  Service: Podiatry;  Laterality: Right;  . WOUND DEBRIDEMENT Right 03/08/2019   Procedure: DEBRIDEMENT WOUND RIGHT FOOT;  Surgeon: [name], DPM;  Location: [facility];  Service: Podiatry;  Laterality: Right;        Home Medications    Prior to Admission medications   Medication Sig Start Date End Date Taking? Authorizing Provider  acetaminophen (TYLENOL) 500 MG tablet Take 500 mg by mouth every 6 (six) hours as needed for mild pain or headache.   Yes [provider]  aspirin EC 81 MG tablet Take 1 tablet (81 mg total) by mouth daily. 03/30/19 03/29/20 Yes Angiulli, Lavon Paganini, PA-C  calcitRIOL (ROCALTROL) 0.5 MCG capsule Take 1 capsule (0.5 mcg total) by mouth every Monday, Wednesday, and Friday with hemodialysis. 03/30/19  Yes Angiulli, Lavon Paganini, PA-C  Calcium Carbonate Antacid (CALCIUM CARBONATE, DOSED IN MG ELEMENTAL CALCIUM,) 1250 MG/5ML SUSP Take 5 mLs (500 mg of elemental calcium total) by mouth every 6 (six) hours as needed for indigestion. 03/30/19  Yes Angiulli, Lavon Paganini, PA-C  clopidogrel (PLAVIX) 75 MG tablet Take 1 tablet (75 mg total) by mouth daily  with breakfast. 03/30/19  Yes Angiulli, Lavon Paganini, PA-C  colchicine 0.6 MG tablet Take 0.6 mg by mouth daily as needed (as directed for gout flares).  02/26/18  Yes [provider]  collagenase (SANTYL) ointment Apply topically daily. Apply as directed per home health nurse.  Apply to right foot wound edge to edge nickel thick.  Cover with wet gauze followed by dry gauze and change daily 03/30/19  Yes Angiulli, Lavon Paganini, PA-C  docusate sodium (COLACE) 100 MG capsule Take 2 capsules (200 mg total) by mouth 2 (two) times daily. 03/30/19  Yes Angiulli, Lavon Paganini, PA-C  methocarbamol (ROBAXIN) 500 MG tablet Take 1 tablet (500  mg total) by mouth every 12 (twelve) hours as needed for muscle spasms. 03/30/19  Yes Angiulli, Lavon Paganini, PA-C  midodrine (PROAMATINE) 10 MG tablet Take 1 tablet (10 mg total) by mouth every Monday, Wednesday, and Friday with hemodialysis. 03/30/19  Yes Angiulli, Lavon Paganini, PA-C  multivitamin (RENA-VIT) TABS tablet Take 1 tablet by mouth at bedtime. 07/23/18  Yes Irene Pap N, DO  oxyCODONE (OXY IR/ROXICODONE) 5 MG immediate release tablet Take 5 mg by mouth every 6 (six) hours as needed for severe pain.   Yes [provider]  rosuvastatin (CRESTOR) 10 MG tablet Take 1 tablet (10 mg total) by mouth daily at 6 PM. 03/30/19  Yes Angiulli, Lavon Paganini, PA-C  Amino Acids-Protein Hydrolys (FEEDING SUPPLEMENT, PRO-STAT SUGAR FREE 64,) LIQD Take 30 mLs by mouth 2 (two) times daily. Patient not taking: Reported on 04/16/2019 03/03/19   [place]ena Jansky, MD  amoxicillin-clavulanate (AUGMENTIN) 875-125 MG tablet Take 1 tablet by mouth 2 (two) times daily for 5 days. 04/17/19 04/22/19  [place] Shi, MD  AURYXIA 1 GM 210 MG(Fe) tablet Take 2 tablets (420 mg total) by mouth 3 (three) times daily with meals. Take 2 tablets (420 mg) by mouth with each meal and with each snack 04/17/19 05/17/19  [place] Shi, MD  bisacodyl (DULCOLAX) 5 MG EC tablet Take 1 tablet (5 mg total) by mouth daily as  needed for moderate constipation. 04/17/19   [place] Shi, MD  cinacalcet (SENSIPAR) 30 MG tablet Take 1 tablet (30 mg total) by mouth every other day. 03/30/19   Angiulli, Lavon Paganini, PA-C  saccharomyces boulardii (FLORASTOR) 250 MG capsule Take 1 capsule (250 mg total) by mouth 2 (two) times daily for 14 days. 04/17/19 05/01/19  [place] Shi, MD    Family History Family History  Problem Relation Age of Onset  . Heart disease Father   . CAD Father     Social History Social History   Tobacco Use  . Smoking status: Former Smoker    Packs/day: 0.75    Years: 30.00    Pack years: 22.50    Types: Cigarettes    Quit date: 11/25/1992    Years since quitting: 26.4  . Smokeless tobacco: Never Used  Substance Use Topics  . Alcohol use: Not Currently  . Drug use: Not Currently    Types: Marijuana    Comment: last use 10 years ago     Allergies   Patient has no known allergies.   Review of Systems Review of Systems Level 5 caveat cannot obtain review of systems due to patient condition.  Physical Exam Updated Vital Signs BP 135/87 (BP Location: Right Arm)   Pulse 84   Temp 97.8 F (36.6 C) (Oral)   Resp 17   Ht 5\' 8"  (1.727 m)   Wt 72.4 kg   SpO2 100%   BMI 24.27 kg/m   Physical Exam Constitutional:      Comments: Patient is drowsy on first assessment seems confused.  He is however arousable and will focus on me.  He will follow some simple commands.  No respiratory distress.  HENT:     Head: Normocephalic and atraumatic.     Mouth/Throat:     Pharynx: Oropharynx is clear.  Cardiovascular:     Comments: Tachycardia.  Grossly regular. Pulmonary:     Comments: No respiratory distress lungs grossly clear. Abdominal:     General: There is no distension.     Palpations: Abdomen is soft.     Tenderness:  There is no abdominal tenderness.  Musculoskeletal:     Comments: Amputation site on the left seems to be healing.  There is some dehiscence but no significant  erythema or drainage.  Right foot wound does exhibit some erythema over the dorsum.  Concerning for cellulitis.  Skin:    General: Skin is warm and dry.  Neurological:     Comments: Initially, patient was confused but responsive.  He would follow simple commands.  No focal motor deficits.  Patient became more alert with resuscitation and began     speaking in clear coordinated sentences.  Psychiatric:        Mood and Affect: Mood normal.      ED Treatments / Results  Labs (all labs ordered are listed, but only abnormal results are displayed) Labs Reviewed  COMPREHENSIVE METABOLIC PANEL - Abnormal; Notable for the following components:      Result Value   Sodium 133 (*)    Potassium 5.6 (*)    Chloride 95 (*)    CO2 15 (*)    Glucose, Bld 164 (*)    BUN 26 (*)    Creatinine, Ser 7.41 (*)    AST 74 (*)    Total Bilirubin 1.3 (*)    GFR calc non Af Amer 7 (*)    GFR calc Af Amer 8 (*)    Anion gap 23 (*)    All other components within normal limits  CBC - Abnormal; Notable for the following components:   Hemoglobin 12.9 (*)    MCV 101.6 (*)    MCHC 29.2 (*)    RDW 17.3 (*)    nRBC 0.5 (*)    All other components within normal limits  LACTIC ACID, PLASMA - Abnormal; Notable for the following components:   Lactic Acid, Venous 8.6 (*)    All other components within normal limits  BASIC METABOLIC PANEL - Abnormal; Notable for the following components:   CO2 18 (*)    BUN 24 (*)    Creatinine, Ser 5.78 (*)    Calcium 7.7 (*)    GFR calc non Af Amer 9 (*)    GFR calc Af Amer 10 (*)    All other components within normal limits  PROTIME-INR - Abnormal; Notable for the following components:   Prothrombin Time 15.4 (*)    All other components within normal limits  APTT - Abnormal; Notable for the following components:   aPTT 40 (*)    All other components within normal limits  HEPATIC FUNCTION PANEL - Abnormal; Notable for the following components:   Albumin 2.8 (*)     All other components within normal limits  CBC - Abnormal; Notable for the following components:   RBC 3.48 (*)    Hemoglobin 10.3 (*)    HCT 34.3 (*)    RDW 16.9 (*)    All other components within normal limits  COMPREHENSIVE METABOLIC PANEL - Abnormal; Notable for the following components:   Sodium 133 (*)    Potassium 3.1 (*)    Chloride 96 (*)    Creatinine, Ser 4.03 (*)    Calcium 8.7 (*)    Albumin 2.9 (*)    GFR calc non Af Amer 14 (*)    GFR calc Af Amer 16 (*)    All other components within normal limits  BASIC METABOLIC PANEL - Abnormal; Notable for the following components:   Sodium 134 (*)    Potassium 3.4 (*)    Chloride  97 (*)    Glucose, Bld 67 (*)    Creatinine, Ser 5.50 (*)    GFR calc non Af Amer 10 (*)    GFR calc Af Amer 11 (*)    All other components within normal limits  CBG MONITORING, ED - Abnormal; Notable for the following components:   Glucose-Capillary 158 (*)    All other components within normal limits  TROPONIN I (HIGH SENSITIVITY) - Abnormal; Notable for the following components:   Troponin I (High Sensitivity) 74 (*)    All other components within normal limits  TROPONIN I (HIGH SENSITIVITY) - Abnormal; Notable for the following components:   Troponin I (High Sensitivity) 114 (*)    All other components within normal limits  CULTURE, BLOOD (ROUTINE X 2)  CULTURE, BLOOD (ROUTINE X 2)  SARS CORONAVIRUS 2 (HOSPITAL ORDER, [facility])  MRSA PCR SCREENING  URINE CULTURE  LACTIC ACID, PLASMA  CORTISOL  HEPATITIS PANEL, ACUTE  CK TOTAL AND CKMB (NOT AT [facility])  LIPASE, BLOOD  GLUCOSE, CAPILLARY  URINALYSIS, ROUTINE W REFLEX MICROSCOPIC    EKG EKG Interpretation  Date/Time:  Thursday April 14 2019 20:26:08 EDT Ventricular Rate:  121 PR Interval:    QRS Duration: 110 QT Interval:  332 QTC Calculation: 471 R Axis:   -79 Text Interpretation:  Atrial fibrillation Inferior infarct, old Consider anterior  infarct When compared with ECG of 12/23/2016, Atrial fibrillation has replaced Sinus rhythm Confirmed by Delora Fuel ([id number]) on 04/14/2019 11:26:38 PM Also confirmed by Delora Fuel ([id number]), editor Hattie Perch ([id number])  on 04/15/2019 2:32:11 PM   Radiology No results found.  Procedures Procedures (including critical care time) CRITICAL CARE Performed by: Charlesetta Shanks   Total critical care time: 10minutes  Critical care time was exclusive of separately billable procedures and treating other patients.  Critical care was necessary to treat or prevent imminent or life-threatening deterioration.  Critical care was time spent personally by me on the following activities: development of treatment plan with patient and/or surrogate as well as nursing, discussions with consultants, evaluation of patient's response to treatment, examination of patient, obtaining history from patient or surrogate, ordering and performing treatments and interventions, ordering and review of laboratory studies, ordering and review of radiographic studies, pulse oximetry and re-evaluation of patient's condition. Medications Ordered in ED Medications  sodium chloride flush (NS) 0.9 % injection 3 mL (3 mLs Intravenous Not Given 04/14/19 2002)  ceFEPIme (MAXIPIME) 1 g in sodium chloride 0.9 % 100 mL IVPB (1 g Intravenous New Bag/Given 04/16/19 2019)  aspirin EC tablet 81 mg (81 mg Oral Given 04/17/19 0811)  midodrine (PROAMATINE) tablet 10 mg (10 mg Oral Given 04/15/19 1244)  rosuvastatin (CRESTOR) tablet 10 mg (10 mg Oral Given 04/16/19 1753)  calcitRIOL (ROCALTROL) capsule 0.5 mcg (0.5 mcg Oral Given 04/15/19 1129)  cinacalcet (SENSIPAR) tablet 30 mg (30 mg Oral Not Given 04/17/19 0804)  ferric citrate (AURYXIA) tablet 420 mg (420 mg Oral Not Given 04/17/19 1201)  calcium carbonate (dosed in mg elemental calcium) suspension 500 mg of elemental calcium (has no administration in time range)  docusate sodium (COLACE) capsule 200 mg  (200 mg Oral Given 04/17/19 0811)  clopidogrel (PLAVIX) tablet 75 mg (75 mg Oral Given 04/17/19 0811)  methocarbamol (ROBAXIN) tablet 500 mg (500 mg Oral Given 04/15/19 1129)  feeding supplement (PRO-STAT SUGAR FREE 64) liquid 30 mL (30 mLs Oral Given 04/17/19 0811)  multivitamin (RENA-VIT) tablet 1 tablet (1 tablet Oral Given 04/16/19 2230)  heparin injection 5,000 Units (5,000 Units Subcutaneous Given 04/17/19 0529)  Chlorhexidine Gluconate Cloth 2 % PADS 6 each (6 each Topical Given 04/17/19 0533)  vancomycin (VANCOCIN) IVPB 750 mg/150 ml premix (750 mg Intravenous New Bag/Given 04/15/19 1617)  collagenase (SANTYL) ointment ( Topical Given 04/17/19 0855)  heparin 1000 UNIT/ML injection (has no administration in time range)  bisacodyl (DULCOLAX) EC tablet 5 mg (5 mg Oral Given 04/15/19 2043)  acetaminophen (TYLENOL) tablet 650 mg (has no administration in time range)  HYDROcodone-acetaminophen (NORCO/VICODIN) 5-325 MG per tablet 1-2 tablet (2 tablets Oral Given 04/15/19 2048)  saccharomyces boulardii (FLORASTOR) capsule 250 mg (250 mg Oral Given 04/17/19 0811)  ceFEPIme (MAXIPIME) 2 g in sodium chloride 0.9 % 100 mL IVPB (0 g Intravenous Stopped 04/14/19 2129)  metroNIDAZOLE (FLAGYL) IVPB 500 mg ( Intravenous Stopped 04/14/19 2134)  vancomycin (VANCOCIN) 1,500 mg in sodium chloride 0.9 % 500 mL IVPB (0 mg Intravenous Stopped 04/14/19 2346)  sodium chloride 0.9 % bolus 2,163 mL (0 mL/kg  72.1 kg Intravenous Stopped 04/14/19 2346)  acetaminophen (TYLENOL) suppository 650 mg (650 mg Rectal Given 04/14/19 2029)  calcium gluconate 1 g/ 50 mL sodium chloride IVPB (0 g Intravenous Stopped 04/14/19 2215)  sodium bicarbonate injection 50 mEq (50 mEq Intravenous Given 04/14/19 2343)  sodium polystyrene (KAYEXALATE) 15 GM/60ML suspension 30 g (30 g Oral Given 04/14/19 2343)  heparin 1000 UNIT/ML injection (4,200 Units  Given 04/15/19 1726)  vancomycin (VANCOCIN) IVPB 750 mg/150 ml premix (0 mg Intravenous Duplicate 01/17/61 9528)  sodium  chloride 0.9 % bolus 250 mL (0 mLs Intravenous Stopped 04/16/19 0032)  sodium phosphate (FLEET) 7-19 GM/118ML enema 1 enema (1 enema Rectal Given 04/16/19 0206)  potassium chloride (KLOR-CON) packet 20 mEq (20 mEq Oral Given 04/16/19 1303)  potassium chloride (KLOR-CON) packet 20 mEq (20 mEq Oral Given 04/17/19 1210)     Initial Impression / Assessment and Plan / ED Course  I have reviewed the triage vital signs and the nursing notes.  Pertinent labs & imaging results that were available during my care of the patient were reviewed by me and considered in my medical decision making (see chart for details).  Clinical Course as of Apr 16 1346  Thu Apr 14, 2019  2123 Consult: Reviewed with Dr. Johnney Ou of nephrology.  She agrees with administering the calcium gluconate.  Could add localma if patient is tolerating p.o.  Patient was dialyzed yesterday.  They will follow along and plan likely dialysis tomorrow.   [MP]  2124 Patient is showing improvement resuscitation.  Heart rate is at 104 now.  Still borderline hypotensive.  Patient is awake and answering questions.  He does not really recall what happened today.  He is denying any pain.  His speech is clear.  He does not have any focal motor deficits.   [MP]    Clinical Course User Index [MP] Charlesetta Shanks, MD      Patient presents with fever, confusion and potential cellulitis of the foot with severe comorbid illness.  Sepsis resuscitation initiated.  Also mildly hyper kalemia with ESRD.  Calcium gluconate administered.  Patient has responded to resuscitation with improvement in heart rate and mental status.  Will plan for admission.  Final Clinical Impressions(s) / ED Diagnoses   Final diagnoses:  Confusion  ESRD on dialysis ([place])  Hyperkalemia Sepsis cellulitis    ED Discharge Orders         Ordered    AURYXIA 1 GM 210 MG(Fe) tablet  3 times daily with  meals     04/17/19 0927    saccharomyces boulardii (FLORASTOR) 250 MG capsule  2  times daily     04/17/19 0927    amoxicillin-clavulanate (AUGMENTIN) 875-125 MG tablet  2 times daily     04/17/19 0927    Increase activity slowly     04/17/19 0927    Diet - low sodium heart healthy     04/17/19 0927    Call MD for:  temperature >100.4     04/17/19 0927    Call MD for:  persistant nausea and vomiting     04/17/19 0927    Call MD for:  severe uncontrolled pain     04/17/19 0927    Call MD for:  difficulty breathing, headache or visual disturbances     04/17/19 0927    Call MD for:  extreme fatigue     04/17/19 0927    bisacodyl (DULCOLAX) 5 MG EC tablet  Daily PRN     04/17/19 1112           Charlesetta Shanks, MD 04/17/19 1356

## 2019-04-17 NOTE — Progress Notes (Addendum)
[facility] Progress Note   Subjective:  Seen in room - feels ok. No CP/dyspnea. Likely for d/c today.   Objective Vitals:   04/16/19 2010 04/16/19 2315 04/17/19 0315 04/17/19 0756  BP: 121/74 (!) 128/48 108/77 135/87  Pulse: 78 83 82 84  Resp: 19 (!) 23 16 17   Temp: 97.6 F (36.4 C) 98.2 F (36.8 C) 97.9 F (36.6 C) 97.8 F (36.6 C)  TempSrc: Oral Oral Oral Oral  SpO2: 100% 99% 100% 100%  Weight:      Height:       Physical Exam General: Well appearing man, NAD Heart: RRR; no murmur Lungs: CTA Abdomen: soft, non-tender Extremities: L BKA, mult R toe amputations. No edema. Dialysis Access:  [employer]  Additional Objective Labs: Basic Metabolic Panel: Recent Labs  Lab 04/14/19 2311 04/16/19 0536 04/17/19 0301  NA 137 133* 134*  K 4.6 3.1* 3.4*  CL 106 96* 97*  CO2 18* 23 22  GLUCOSE 77 71 67*  BUN 24* 14 23  CREATININE 5.78* 4.03* 5.50*  CALCIUM 7.7* 8.7* 9.4   Liver Function Tests: Recent Labs  Lab 04/14/19 1920 04/15/19 1526 04/16/19 0536  AST 74* 39 38  ALT 29 25 31   ALKPHOS 78 64 62  BILITOT 1.3* 0.7 0.7  PROT 8.1 6.5 6.7  ALBUMIN 3.5 2.8* 2.9*   Recent Labs  Lab 04/15/19 0542  LIPASE 22   CBC: Recent Labs  Lab 04/14/19 1920 04/16/19 0536  WBC 5.8 4.2  HGB 12.9* 10.3*  HCT 44.2 34.3*  MCV 101.6* 98.6  PLT 170 180   Blood Culture    Component Value Date/Time   SDES BLOOD RIGHT HAND 04/14/2019 2001   SPECREQUEST  04/14/2019 2001    BOTTLES DRAWN AEROBIC ONLY Blood Culture adequate volume   CULT  04/14/2019 2001    NO GROWTH 3 DAYS Performed at [facility], [place] [address], [place], [place] [address]    REPTSTATUS PENDING 04/14/2019 2001   Medications: . ceFEPime (MAXIPIME) IV 1 g (04/16/19 2019)  . [START ON 04/18/2019] vancomycin 750 mg (04/15/19 1617)   . aspirin EC  81 mg Oral Daily  . calcitRIOL  0.5 mcg Oral Q M,W,F-HD  . Chlorhexidine Gluconate Cloth  6 each Topical Q0600  . cinacalcet  30 mg Oral Q48H   . clopidogrel  75 mg Oral Q breakfast  . collagenase   Topical Daily  . docusate sodium  200 mg Oral BID  . feeding supplement (PRO-STAT SUGAR FREE 64)  30 mL Oral BID  . ferric citrate  420 mg Oral TID WC  . heparin  5,000 Units Subcutaneous Q8H  . midodrine  10 mg Oral Q M,W,F-HD  . multivitamin  1 tablet Oral QHS  . potassium chloride  20 mEq Oral Once  . rosuvastatin  10 mg Oral q1800  . saccharomyces boulardii  250 mg Oral BID  . sodium chloride flush  3 mL Intravenous Once    Dialysis Orders: [facility] MWF 4hr, 400/800, 3K/2.5Ca bath, EDW 73kg, TDC, heparin 5000 + 2000 mid-run bolus - Mircera 112mcg q 2 weeks (ordered - not given yet. Typically Hgb > 13) - Calcitriol 0.59mcg PO q HD  Assessment/Plan: 1. Sepsis(?): Febrile and hypotensive on admit - improved. BCx 9/3 negative. COVID negative, CXR clear. On Vanc/Cefepime. 2. ESRD: Continue HD per MWF schedule - next 9/7. K low 9/5 - 1 dose KCl 52mEq. 3. HypoTN/volume: Uses midodrine for BP support on dialysis. Close to EDW. 4.  Anemia: Hgb 10.3 - resume Aranesp with next HD. 5. Secondary hyperparathyroidism: Ca ok, continue home binders Lorin Picket) + VDRA. 6. Nutrition: Alb low, continue pro-stat supps. 7. Hx AICD 8. L BKA with mild wound dehiscence: VVS consulted; no surgical intervention needed - wound care. 9. Constipation: Given 1 dose Fleet enema this AM, watch this in future d/t ^ Phos load. 10. Dilated bile duct + gallstones: Surgery consulted, watch for now with outpatient EUS. No surgery planned.  Veneta Penton, PA-C 04/17/2019, 11:30 AM  [facility] Pager: [phone]  Pt seen, examined and agree w A/P as above. Fevers resolved quickly and pt is getting empiric abx for possible passed CBD stone/ cholangitis. For dc today w/ 5 more days po Augmentin.  Will f/u w/ GI in a few weeks to have OP EUS.   [name]  MD 04/17/2019, 12:17 PM

## 2019-04-17 NOTE — Progress Notes (Signed)
Pt given D/c education and all questions answered. No printed prescriptions to give or equipment to deliver. IV's removed. Patient taken to car with all belongings.

## 2019-04-17 NOTE — Discharge Summary (Addendum)
Physician Discharge Summary  Timothy Faulkner XBM:[id number] DOB: 01/27/1946 DOA: 04/14/2019  PCP: [name], MD  Admit date: 04/14/2019 Discharge date: 04/17/2019 Consultations:  Admitted From:  Disposition:   Discharge Diagnoses:  Principal Problem:   Sepsis ([place]) Active Problems:   Anemia associated with chronic renal failure   Hypotension   S/P BKA (below knee amputation), left ([place])   ESRD on dialysis [facility])   Hospital Course Summary: 73 y.o.male with medical history significant ofDM, HTN, HLD, ESRD on MWF HD; systolic CHF EF 02% s/p AICD placement,PAD requiring revascularization procedure on 7/17, atherectomy on 7/20,  3rd& 4rthtoe amputation on the L foot on 7/25 complicated by R 2nd toe wound infection8/1 as well as recent L BKA 8/4 who follows Dr March Rummage (Podiatry) as outpatient was sent to ED after noted by family to have altered mental status and hypotension.Pt noted to be febrile in ED (isolated temp 101.4)  and hypotensive,lowest BP recorded at 79/47 (has chronic hypotension as well)  but oriented x 3. Has been going to HD regularly. On ED work up found to have gallstoneson ultrasound with a dilated common bile duct of 12 mm diameter in the setting of elevated liver chemistries(AST 74, compared to baseline of 17 a month earlier; ALT 29 compared to 6 a month earlier; bilirubin 1.3 compared to 0.9 a month earlier). Patient admitted with empiric antibiotics and GI consultation. GI feels patient might have passed a stone /developed mild cholangitis in the process which could have caused fevers. They recommend antibiotic course with plans for outpatient EUS.  Patient also seen by Podiatry, Dr March Rummage, as courtesy who felt wound was well appearing without signs of infection-Dressed with aquacell and dry dressing.  1.  Sepsis syndrome (fevers/tachycardia/hypotension) POA: Unclear source.  Seen by podiatry and vascular surgery for foot wound as well as mild incision separation at left BKA  stump.  Non-concerning for acute infection.  Seen by GI for dilated CBD noted at 12 mm on ultrasonogram with mildly distended gallbladder/gallstones.  Patient could not undergo MRCP due to AICD.  Seen by GI who recommended empiric treatment for cholangitis as he might have passed a stone.  His labs improved with IV fluids/antibiotics. GI recommended discharge on Augmentin for 5 days and follow up as outpatient for EUS in few weeks.   2.  Reported altered mental status at home: Not present on ED evaluation or at admission.  Not sure if he could have had metabolic encephalopathy in the setting of problem #1 and problem #6.  Now resolved and back to baseline.  3.  Dilated CBD/cholelithiasis/possible cholangitis:GI recommended outpatient follow-up for endoscopic ultrasonogram to rule out structures/ampullary mass/retained stone which could be contributing to CBD dilatation.  Patient does not have any pain and is not significantly jaundiced.  His labs improved with IV fluids/antibiotics. GI recommended discharge on Augmentin for 5 days and follow up as outpatient.  4.  Peripheral arterial disease, status post left BKA, incision separation:   Patient underwent left BKA on 8/4 by Dr. Oneida Alar.He was last seen on 04/12/19 to remove staples and start prothesis stump sinkers. He tolerated the staple remove and the stump was well healed at the time of his last visit.   Since then, daughter reported trauma to the stump when patient was trying to get out of the car.Seen by vascular surgery Dr. Trula Slade on 9/4 for follow-up given concern for slight incision separation noted on admission.  Vascular surgery did not feel any acute intervention needed other than  daily dressing changes.  Continue statin/Plavix and f/u  Podiatry as scheduled. Will resume [place] wound care prior to discharge.Daughter been doing dressing changes.  5.  ESRD: Resume hemodialysis Monday Wednesday Friday.  Seen by nephrology.  Patient refusing  Sensipar/iron supplements.  6.  Chronic hypotension:He reports his BP runs anywhere from 75 to 100. Cortisolok. f/uecho, resumed Midodrin which he takes on dialysis days.  7.  Abnormal troponin: No complaints of chest pain but patient was tachycardic in the ED (appears to be sinus tachycardia).  Likely mild elevation in troponin due to hypotension/ESRD and of unclear significance.  He does have significant cardiomyopathy at baseline and is status post AICD.  Repeat echo done but not read (labor day weekend) . He says he will follow-up results with PCP early next week.  8.  Right foot wound: Appreciate podiatry evaluation and recommendations of Santyl WTD dressing daily by nursing  9.  Anemiaof chronic renal disease, continueAuryxia,hemoglobin overall stable  10.  Diabetes mellitus: Not on medications at home.  Sliding scale insulin  11. Hypokalemia: Replaced cautiously as required treatment for mild hyperkalemia on presentation.   Discharge Exam:  Vitals:   04/17/19 0315 04/17/19 0756  BP: 108/77 135/87  Pulse: 82 84  Resp: 16 17  Temp: 97.9 F (36.6 C) 97.8 F (36.6 C)  SpO2: 100% 100%   Vitals:   04/16/19 2010 04/16/19 2315 04/17/19 0315 04/17/19 0756  BP: 121/74 (!) 128/48 108/77 135/87  Pulse: 78 83 82 84  Resp: 19 (!) 23 16 17   Temp: 97.6 F (36.4 C) 98.2 F (36.8 C) 97.9 F (36.6 C) 97.8 F (36.6 C)  TempSrc: Oral Oral Oral Oral  SpO2: 100% 99% 100% 100%  Weight:      Height:        General: Pt is alert, awake, not in acute distress Cardiovascular: RRR, S1/S2 +, no rubs, no gallops Respiratory: CTA bilaterally, no wheezing, no rhonchi Abdominal: Soft, NT, ND, bowel sounds + Extremities: no edema, no cyanosis  Discharge Condition:Stable CODE STATUS: Full code Diet recommendation: Renal diet Recommendations for Outpatient Follow-up:  1. Follow up with PCP: 5 days 2. Follow up with consultants: Podiatry in 1 week, GI clinic in 2 weeks 3. Please  obtain follow up labs including: Echo report  Home Health services upon discharge: resume wound care    Discharge Instructions:  Discharge Instructions    Call MD for:  difficulty breathing, headache or visual disturbances   Complete by: As directed    Call MD for:  extreme fatigue   Complete by: As directed    Call MD for:  persistant nausea and vomiting   Complete by: As directed    Call MD for:  severe uncontrolled pain   Complete by: As directed    Call MD for:  temperature >100.4   Complete by: As directed    Diet - low sodium heart healthy   Complete by: As directed    Increase activity slowly   Complete by: As directed      Allergies as of 04/17/2019   No Known Allergies     Medication List    TAKE these medications   acetaminophen 500 MG tablet Commonly known as: TYLENOL Take 500 mg by mouth every 6 (six) hours as needed for mild pain or headache.   amoxicillin-clavulanate 500-125 MG tablet Commonly known as: Augmentin Take 1 tablet by mouth once daily for 5 days.   aspirin EC 81 MG tablet Take 1 tablet (81  mg total) by mouth daily.   Auryxia 1 GM 210 MG(Fe) tablet Generic drug: ferric citrate Take 2 tablets (420 mg total) by mouth 3 (three) times daily with meals. Take 2 tablets (420 mg) by mouth with each meal and with each snack What changed: when to take this   bisacodyl 5 MG EC tablet Commonly known as: DULCOLAX Take 1 tablet (5 mg total) by mouth daily as needed for moderate constipation.   calcitRIOL 0.5 MCG capsule Commonly known as: ROCALTROL Take 1 capsule (0.5 mcg total) by mouth every Monday, Wednesday, and Friday with hemodialysis.   calcium carbonate (dosed in mg elemental calcium) 1250 MG/5ML Susp Take 5 mLs (500 mg of elemental calcium total) by mouth every 6 (six) hours as needed for indigestion.   cinacalcet 30 MG tablet Commonly known as: Sensipar Take 1 tablet (30 mg total) by mouth every other day.   clopidogrel 75 MG  tablet Commonly known as: PLAVIX Take 1 tablet (75 mg total) by mouth daily with breakfast.   colchicine 0.6 MG tablet Take 0.6 mg by mouth daily as needed (as directed for gout flares).   collagenase ointment Commonly known as: SANTYL Apply topically daily. Apply as directed per home health nurse.  Apply to right foot wound edge to edge nickel thick.  Cover with wet gauze followed by dry gauze and change daily   docusate sodium 100 MG capsule Commonly known as: COLACE Take 2 capsules (200 mg total) by mouth 2 (two) times daily.   feeding supplement (PRO-STAT SUGAR FREE 64) Liqd Take 30 mLs by mouth 2 (two) times daily.   methocarbamol 500 MG tablet Commonly known as: ROBAXIN Take 1 tablet (500 mg total) by mouth every 12 (twelve) hours as needed for muscle spasms.   midodrine 10 MG tablet Commonly known as: PROAMATINE Take 1 tablet (10 mg total) by mouth every Monday, Wednesday, and Friday with hemodialysis.   multivitamin Tabs tablet Take 1 tablet by mouth at bedtime.   oxyCODONE 5 MG immediate release tablet Commonly known as: Oxy IR/ROXICODONE Take 5 mg by mouth every 6 (six) hours as needed for severe pain.   rosuvastatin 10 MG tablet Commonly known as: CRESTOR Take 1 tablet (10 mg total) by mouth daily at 6 PM.   saccharomyces boulardii 250 MG capsule Commonly known as: FLORASTOR Take 1 capsule (250 mg total) by mouth 2 (two) times daily for 14 days.       No Known Allergies    The results of significant diagnostics from this hospitalization (including imaging, microbiology, ancillary and laboratory) are listed below for reference.    Labs: BNP (last 3 results) No results for input(s): BNP in the last 8760 hours. Basic Metabolic Panel: Recent Labs  Lab 04/14/19 1920 04/14/19 2311 04/16/19 0536 04/17/19 0301  NA 133* 137 133* 134*  K 5.6* 4.6 3.1* 3.4*  CL 95* 106 96* 97*  CO2 15* 18* 23 22  GLUCOSE 164* 77 71 67*  BUN 26* 24* 14 23  CREATININE  7.41* 5.78* 4.03* 5.50*  CALCIUM 10.1 7.7* 8.7* 9.4   Liver Function Tests: Recent Labs  Lab 04/14/19 1920 04/15/19 1526 04/16/19 0536  AST 74* 39 38  ALT 29 25 31   ALKPHOS 78 64 62  BILITOT 1.3* 0.7 0.7  PROT 8.1 6.5 6.7  ALBUMIN 3.5 2.8* 2.9*   Recent Labs  Lab 04/15/19 0542  LIPASE 22   No results for input(s): AMMONIA in the last 168 hours. CBC: Recent Labs  Lab 04/14/19 1920 04/16/19 0536  WBC 5.8 4.2  HGB 12.9* 10.3*  HCT 44.2 34.3*  MCV 101.6* 98.6  PLT 170 180   Cardiac Enzymes: Recent Labs  Lab 04/15/19 0542  CKTOTAL 49  CKMB 1.4   BNP: Invalid input(s): POCBNP CBG: Recent Labs  Lab 04/14/19 1918 04/15/19 0209  GLUCAP 158* 74   D-Dimer No results for input(s): DDIMER in the last 72 hours. Hgb A1c No results for input(s): HGBA1C in the last 72 hours. Lipid Profile No results for input(s): CHOL, HDL, LDLCALC, TRIG, CHOLHDL, LDLDIRECT in the last 72 hours. Thyroid function studies No results for input(s): TSH, T4TOTAL, T3FREE, THYROIDAB in the last 72 hours.  Invalid input(s): FREET3 Anemia work up No results for input(s): VITAMINB12, FOLATE, FERRITIN, TIBC, IRON, RETICCTPCT in the last 72 hours. Urinalysis    Component Value Date/Time   COLORURINE YELLOW 03/11/2013 1548   APPEARANCEUR CLOUDY (A) 03/11/2013 1548   LABSPEC 1.017 03/11/2013 1548   PHURINE 5.0 03/11/2013 1548   GLUCOSEU NEGATIVE 03/11/2013 1548   HGBUR NEGATIVE 03/11/2013 1548   [place] 03/11/2013 1548   KETONESUR NEGATIVE 03/11/2013 1548   PROTEINUR >300 (A) 03/11/2013 1548   UROBILINOGEN 1.0 03/11/2013 1548   NITRITE NEGATIVE 03/11/2013 1548   LEUKOCYTESUR SMALL (A) 03/11/2013 1548   Sepsis Labs Invalid input(s): PROCALCITONIN,  WBC,  LACTICIDVEN Microbiology Recent Results (from the past 240 hour(s))  Blood culture (routine x 2)     Status: None (Preliminary result)   Collection Time: 04/14/19  7:38 PM   Specimen: BLOOD  Result Value Ref Range  Status   Specimen Description BLOOD BLOOD RIGHT FOREARM  Final   Special Requests   Final    BOTTLES DRAWN AEROBIC AND ANAEROBIC Blood Culture results may not be optimal due to an excessive volume of blood received in culture bottles   Culture   Final    NO GROWTH 3 DAYS Performed at [facility], [place] [address], [place], [place] [address]    Report Status PENDING  Incomplete  Blood culture (routine x 2)     Status: None (Preliminary result)   Collection Time: 04/14/19  8:01 PM   Specimen: BLOOD RIGHT HAND  Result Value Ref Range Status   Specimen Description BLOOD RIGHT HAND  Final   Special Requests   Final    BOTTLES DRAWN AEROBIC ONLY Blood Culture adequate volume   Culture   Final    NO GROWTH 3 DAYS Performed at [facility], [place] [address], [place], [place] [address]    Report Status PENDING  Incomplete  SARS Coronavirus 2 [facility] order, Performed in [facility] hospital lab) Nasopharyngeal Nasopharyngeal Swab     Status: None   Collection Time: 04/14/19  8:08 PM   Specimen: Nasopharyngeal Swab  Result Value Ref Range Status   SARS Coronavirus 2 NEGATIVE NEGATIVE Final    Comment: (NOTE) If result is NEGATIVE SARS-CoV-2 target nucleic acids are NOT DETECTED. The SARS-CoV-2 RNA is generally detectable in upper and lower  respiratory specimens during the acute phase of infection. The lowest  concentration of SARS-CoV-2 viral copies this assay can detect is 250  copies / mL. A negative result does not preclude SARS-CoV-2 infection  and should not be used as the sole basis for treatment or other  patient management decisions.  A negative result may occur with  improper specimen collection / handling, submission of specimen other  than nasopharyngeal swab, presence of viral mutation(s) within the  areas targeted by this assay, and inadequate number of viral copies  (<250 copies / mL). A negative result must be combined with clinical  observations, patient  history, and epidemiological information. If result is POSITIVE SARS-CoV-2 target nucleic acids are DETECTED. The SARS-CoV-2 RNA is generally detectable in upper and lower  respiratory specimens dur ing the acute phase of infection.  Positive  results are indicative of active infection with SARS-CoV-2.  Clinical  correlation with patient history and other diagnostic information is  necessary to determine patient infection status.  Positive results do  not rule out bacterial infection or co-infection with other viruses. If result is PRESUMPTIVE POSTIVE SARS-CoV-2 nucleic acids MAY BE PRESENT.   A presumptive positive result was obtained on the submitted specimen  and confirmed on repeat testing.  While 2019 novel coronavirus  (SARS-CoV-2) nucleic acids may be present in the submitted sample  additional confirmatory testing may be necessary for epidemiological  and / or clinical management purposes  to differentiate between  SARS-CoV-2 and other Sarbecovirus currently known to infect humans.  If clinically indicated additional testing with an alternate test  methodology [phone]) is advised. The SARS-CoV-2 RNA is generally  detectable in upper and lower respiratory sp ecimens during the acute  phase of infection. The expected result is Negative. Fact Sheet for Patients:  StrictlyIdeas.no Fact Sheet for Healthcare Providers: BankingDealers.co.za This test is not yet approved or cleared by the [place] FDA and has been authorized for detection and/or diagnosis of SARS-CoV-2 by FDA under an Emergency Use Authorization (EUA).  This EUA will remain in effect (meaning this test can be used) for the duration of the COVID-19 declaration under Section 564(b)(1) of the Act, 21 U.S.C. section 360bbb-3(b)(1), unless the authorization is terminated or revoked sooner. Performed at [facility], [place] [address] [place], [place] [address]    MRSA PCR Screening     Status: None   Collection Time: 04/15/19  2:20 AM   Specimen: Nasal Mucosa; Nasopharyngeal  Result Value Ref Range Status   MRSA by PCR NEGATIVE NEGATIVE Final    Comment:        The GeneXpert MRSA Assay (FDA approved for NASAL specimens only), is one component of a comprehensive MRSA colonization surveillance program. It is not intended to diagnose MRSA infection nor to guide or monitor treatment for MRSA infections. Performed at [facility], [place] [address], [place], [place] [address]     Procedures/Studies: Ct Head Wo Contrast  Result Date: 04/14/2019 CLINICAL DATA:  Altered mental status. EXAM: CT HEAD WITHOUT CONTRAST TECHNIQUE: Contiguous axial images were obtained from the base of the skull through the vertex without intravenous contrast. COMPARISON:  Head CT 06/10/2018 FINDINGS: Brain: No intracranial hemorrhage, mass effect, or midline shift. Unchanged degree of atrophy and chronic small vessel ischemia. No hydrocephalus. The basilar cisterns are patent. No evidence of territorial infarct or acute ischemia. No extra-axial or intracranial fluid collection. Vascular: Dense atherosclerosis of skullbase vasculature without hyperdense vessel or abnormal calcification. Skull: No fracture or focal lesion. Sinuses/Orbits: Mucous retention cyst in right maxillary greater than left sinus. No acute findings. Bilateral cataract resection. Other: None. IMPRESSION: 1. No acute intracranial abnormality. 2. Unchanged atrophy and chronic small vessel ischemia. Electronically Signed   By: Keith Rake M.D.   On: 04/14/2019 23:58   Dg Chest Port 1 View  Result Date: 04/14/2019 CLINICAL DATA:  Patient with sepsis. EXAM: PORTABLE CHEST 1 VIEW COMPARISON:  Chest radiograph 04/22/2018 FINDINGS: Central venous  catheter tip projects over the right atrium. Stable single lead AICD device overlying the left hemithorax. Monitoring leads overlie the patient. Stable cardiac and  mediastinal contours. Low lung volumes. Bibasilar atelectasis. No pleural effusion or pneumothorax. IMPRESSION: Low lung volumes with basilar atelectasis. Electronically Signed   By: Lovey Newcomer M.D.   On: 04/14/2019 20:27   US Abdomen Limited Ruq  Result Date: 04/15/2019 CLINICAL DATA:  Abnormal liver function tests. EXAM: ULTRASOUND ABDOMEN LIMITED RIGHT UPPER QUADRANT COMPARISON:  None. FINDINGS: Gallbladder: Gallbladder is moderately distended to 3.3 cm. Mild gallbladder wall thickening to 6 mm. No pericholecystic fluid. Several small gallstones collect dependently within the gallbladder. Negative sonographic Murphy's sign. Common bile duct: Diameter: Dilated to 12 mm. Liver: No focal lesion identified. Within normal limits in parenchymal echogenicity. Portal vein is patent on color Doppler imaging with normal direction of blood flow towards the liver. Other: Lobular echogenic RIGHT kidney. IMPRESSION: 1. Mild gallbladder distension with gallstones. No evidence acute cholecystitis. 2. Dilated common bile duct. With above noted gallstones, concern for choledocholithiasis. Consider MRCP for further evaluation. Electronically Signed   By: Suzy Bouchard M.D.   On: 04/15/2019 10:28     Time coordinating discharge: Over 30 minutes  SIGNED:  Guilford Shi, MD  [facility] 04/17/2019, 11:12 AM Pager : [phone]  Addendum: d/w Dr Jonnie Finner regarding antibiotic dose and he recommended Augmentin 500/125mg  once a day , to take after dialysis. Clarified prescription and d/w daughter who knows to pick up the correct prescription. Patient scheduled for dialysis in am and nephrology NP will f/u as well.

## 2019-04-19 ENCOUNTER — Other Ambulatory Visit: Payer: Self-pay

## 2019-04-19 ENCOUNTER — Encounter: Payer: Self-pay | Admitting: Physical Medicine and Rehabilitation

## 2019-04-19 ENCOUNTER — Encounter
Payer: Medicare Other | Attending: Physical Medicine and Rehabilitation | Admitting: Physical Medicine and Rehabilitation

## 2019-04-19 VITALS — BP 119/79 | HR 84 | Temp 97.9°F

## 2019-04-19 DIAGNOSIS — Z992 Dependence on renal dialysis: Secondary | ICD-10-CM | POA: Diagnosis present

## 2019-04-19 DIAGNOSIS — L97512 Non-pressure chronic ulcer of other part of right foot with fat layer exposed: Secondary | ICD-10-CM | POA: Diagnosis not present

## 2019-04-19 DIAGNOSIS — N186 End stage renal disease: Secondary | ICD-10-CM | POA: Diagnosis present

## 2019-04-19 DIAGNOSIS — Z89512 Acquired absence of left leg below knee: Secondary | ICD-10-CM | POA: Diagnosis not present

## 2019-04-19 DIAGNOSIS — I739 Peripheral vascular disease, unspecified: Secondary | ICD-10-CM | POA: Diagnosis not present

## 2019-04-19 LAB — CULTURE, BLOOD (ROUTINE X 2)
Culture: NO GROWTH
Culture: NO GROWTH
Special Requests: ADEQUATE

## 2019-04-19 NOTE — Progress Notes (Signed)
Subjective:    Patient ID: Timothy Faulkner, male    DOB: [birth year], 73 y.o.   MRN: [id number]  HPI   CC; L BKA  Pt is a 73 yr old male with ESRD on HD and L BKA -   Leaving MD appt last Thursday- was in car- was acting lethargic- wouldn't use sliding board pulling on steering wheel/opposite direction- and landed on residual limb- called ambulance- just got out of hospital Sunday. Was NOT responsive when they came Thursday at all.   Since Sunday, been OK- sometimes doesn't know where he's out/time of day. In general, maybe more himself- but still intermittently confused.     To do endoscopy next week; also to see Cardiology in 12/20 for ICD. Was discharged on Augmentin x 5 more days for fever- thought to have cholangitis.   Got stitches out last Tuesday.  Tolerating/pain doing OK. Hasn't had much pain- hasn't had any pain meds since last Wednesday.   since Thanksgiving 2019- since had wound on RLE- prior to that, was walking without assistive device of any kind.  Didn't get tired easily.   Been on HD for 6 years.   Walked- using RW- transferring using sliding board/wc at home- no walking since amputation.   Pain Inventory Average Pain 7 Pain Right Now 3 My pain is aching  In the last 24 hours, has pain interfered with the following? General activity 4 Relation with others 6 Enjoyment of life 8 What TIME of day is your pain at its worst? evening Sleep (in general) Fair  Pain is worse with: na Pain improves with: medication Relief from Meds: na  Mobility ability to climb steps?  no do you drive?  no  Function disabled: date disabled na I need assistance with the following:  dressing, bathing and toileting  Neuro/Psych numbness tremor confusion  Prior Studies Any changes since last visit?  no  Physicians involved in your care Transition Care Visit   Family History  Problem Relation Age of Onset  . Heart disease Father   . CAD Father     Social History   Socioeconomic History  . Marital status: Married    Spouse name: [name]  . Number of children: 2  . Years of education: 64  . Highest education level: Not on file  Occupational History  . Occupation: diasbled  Social Needs  . Financial resource strain: Not on file  . Food insecurity    Worry: Not on file    Inability: Not on file  . Transportation needs    Medical: Not on file    Non-medical: Not on file  Tobacco Use  . Smoking status: Former Smoker    Packs/day: 0.75    Years: 30.00    Pack years: 22.50    Types: Cigarettes    Quit date: 11/25/1992    Years since quitting: 26.4  . Smokeless tobacco: Never Used  Substance and Sexual Activity  . Alcohol use: Not Currently  . Drug use: Not Currently    Types: Marijuana    Comment: last use 10 years ago  . Sexual activity: Yes  Lifestyle  . Physical activity    Days per week: Not on file    Minutes per session: Not on file  . Stress: Not on file  Relationships  . Social Herbalist on phone: Not on file    Gets together: Not on file    Attends religious service: Not on file  Active member of club or organization: Not on file    Attends meetings of clubs or organizations: Not on file    Relationship status: Not on file  Other Topics Concern  . 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 A/V FISTULAGRAM Left 07/20/2018   Procedure: A/V FISTULAGRAM;  Surgeon: [name], MD;  Location: [place] [facility];  Service: Cardiovascular;  Laterality: Left;  . ABDOMINAL AORTOGRAM N/A 02/25/2019   Procedure: ABDOMINAL AORTOGRAM;  Surgeon: Elam Dutch, MD;  Location: [place] [facility];  Service: Cardiovascular;  Laterality: N/A;  . ABDOMINAL AORTOGRAM W/LOWER EXTREMITY Bilateral 07/20/2018    Procedure: ABDOMINAL AORTOGRAM W/LOWER EXTREMITY;  Surgeon: [name], MD;  Location: [place] [facility];  Service: Cardiovascular;  Laterality: Bilateral;  . AMPUTATION Right 09/07/2018   Procedure: Right fifth metatarsectomy;  Surgeon: [name], DPM;  Location: [place];  Service: Podiatry;  Laterality: Right;  . AMPUTATION Left 03/08/2019   Procedure: AMPUTATION  3RD AND 4TH TOES LEFT FOOT;  Surgeon: [name], DPM;  Location: [place];  Service: Podiatry;  Laterality: Left;  . AMPUTATION Left 03/15/2019   Procedure: AMPUTATION BELOW KNEE;  Surgeon: Elam Dutch, MD;  Location: [facility];  Service: Vascular;  Laterality: Left;  . AV FISTULA PLACEMENT Right 12/13/2012   Procedure: ARTERIOVENOUS (AV) FISTULA CREATION;  Surgeon: [name], MD;  Location: [place];  Service: Vascular;  Laterality: Right;  Ultrasound guided  . AV FISTULA PLACEMENT Left 05/07/2016   Procedure: LEFT RADIOCEPHALIC ARTERIOVENOUS (AV) FISTULA CREATION;  Surgeon: [name], MD;  Location: [place];  Service: Vascular;  Laterality: Left;  . BASCILIC VEIN TRANSPOSITION Right 03/26/2016   Procedure: RIGHT BASILIC VEIN TRANSPOSITION;  Surgeon: [name], MD;  Location: [place];  Service: Vascular;  Laterality: Right;  . CARDIAC CATHETERIZATION    . CARDIAC DEFIBRILLATOR PLACEMENT     Chemical engineer  . EYE SURGERY Bilateral    Cataract  . FISTULOGRAM Left 04/22/2018   Procedure: FISTULOGRAM UPPER EXTREMITY;  Surgeon: [name], MD;  Location: [place];  Service: Vascular;  Laterality: Left;  . I&D EXTREMITY Right 07/15/2018   Procedure: IRRIGATION AND DEBRIDEMENT RIGHT FOOT;  Surgeon: [name], DPM;  Location: [place];  Service: Podiatry;  Laterality: Right;  . INCISION AND DRAINAGE ABSCESS / HEMATOMA OF BURSA / KNEE / THIGH Left 2012   "knee" (03/11/2013)  . INSERT / REPLACE / REMOVE PACEMAKER    . INSERTION OF DIALYSIS CATHETER Left 04/22/2018   Procedure: INSERTION OF DIALYSIS CATHETER;   Surgeon: [name], MD;  Location: [place];  Service: Vascular;  Laterality: Left;  . IR FLUORO GUIDE CV LINE LEFT  07/15/2018  . IR PTA VENOUS EXCEPT DIALYSIS CIRCUIT  07/15/2018  . LOWER EXTREMITY ANGIOGRAPHY Right 07/21/2018   Procedure: LOWER EXTREMITY ANGIOGRAPHY;  Surgeon: [name], MD;  Location: [place] [facility];  Service: Cardiovascular;  Laterality: Right;  . LOWER EXTREMITY ANGIOGRAPHY Bilateral 02/25/2019   Procedure: Lower Extremity Angiography;  Surgeon: Elam Dutch, MD;  Location: [place] [facility];  Service: Cardiovascular;  Laterality: Bilateral;  . METATARSAL HEAD EXCISION Right 07/15/2018   Procedure: METATARSAL RESECTION;  Surgeon: [name],  DPM;  Location: [place];  Service: Podiatry;  Laterality: Right;  . MULTIPLE TOOTH EXTRACTIONS    . PERIPHERAL VASCULAR ATHERECTOMY Right 02/28/2019   Procedure: PERIPHERAL VASCULAR ATHERECTOMY;  Surgeon: Waynetta Sandy, MD;  Location: [place] [facility];  Service: Cardiovascular;  Laterality: Right;  right tp trunk   . PERIPHERAL VASCULAR BALLOON ANGIOPLASTY Left 02/25/2019   Procedure: PERIPHERAL VASCULAR BALLOON ANGIOPLASTY;  Surgeon: Elam Dutch, MD;  Location: [place] [facility];  Service: Cardiovascular;  Laterality: Left;  tibial peroneal trunk and PT  . PERIPHERAL VASCULAR INTERVENTION Right 07/21/2018   Procedure: PERIPHERAL VASCULAR INTERVENTION;  Surgeon: [name], MD;  Location: [place] [facility];  Service: Cardiovascular;  Laterality: Right;  peroneal stents  . REVISON OF ARTERIOVENOUS FISTULA Right 01/29/3085   Procedure: Plication OF Right Arm ARTERIOVENOUS FISTULA;  Surgeon: Elam Dutch, MD;  Location: [facility] OR;  Service: Vascular;  Laterality: Right;  . REVISON OF ARTERIOVENOUS FISTULA Left 04/22/2018   Procedure: REVISION OF RADIOCEPHALIC ARTERIOVENOUS FISTULA;  Surgeon: [name], MD;  Location: [place];  Service: Vascular;  Laterality: Left;  .  SHUNTOGRAM N/A 05/31/2013   Procedure: Earney Mallet;  Surgeon: [name], MD;  Location: [facility] LAB;  Service: Cardiovascular;  Laterality: N/A;  . UPPER EXTREMITY VENOGRAPHY Right 07/23/2018   Procedure: UPPER EXTREMITY VENOGRAPHY;  Surgeon: [name], MD;  Location: [place] [facility];  Service: Cardiovascular;  Laterality: Right;  . WOUND DEBRIDEMENT Right 07/17/2018   Procedure: Wound Debridement; Closure Filleted toe flap Right Foot;  Surgeon: [name], DPM;  Location: [place];  Service: Podiatry;  Laterality: Right;  . WOUND DEBRIDEMENT Right 09/07/2018   Procedure: Debridement Right Foot Wound, application of wound vac;  Surgeon: [name], DPM;  Location: [place];  Service: Podiatry;  Laterality: Right;  . WOUND DEBRIDEMENT Right 03/08/2019   Procedure: DEBRIDEMENT WOUND RIGHT FOOT;  Surgeon: [name], DPM;  Location: [place];  Service: Podiatry;  Laterality: Right;   Past Medical History:  Diagnosis Date  . Anemia   . Arthritis    Gout  . Automatic implantable cardioverter-defibrillator in situ    [employer]  . CHF   . ESRD on dialysis [facility])    [name] 03/11/2013 (03/11/2013) dialysis M/W/F  . Gangrene ([place])    left foot  . GERD (gastroesophageal reflux disease)   . Gout    "once a year"  . Heart murmur   . HYPERCHOLESTEROLEMIA, MIXED   . HYPERTENSION   . Macular degeneration   . Osteomyelitis ([place])    right foot  . 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 Pacemaker   . Peripheral arterial disease (HCC)    left fifth toe ulcer, healing  . Pneumonia   . Shortness of breath   . 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 Wears dentures   . Wears glasses    BP 119/79   Pulse 84   Temp 97.9 F (36.6 C)   SpO2 98%   Opioid Risk Score:   Fall Risk Score:  `1  Depression screen PHQ 2/9  Depression screen PHQ 2/9 09/01/2018  Decreased Interest 0  Down, Depressed, Hopeless 0  PHQ - 2  Score 0  Some recent data might be hidden   Review of Systems  Constitutional: Negative.   HENT: Negative.   Eyes: Negative.   Respiratory: Negative.   Cardiovascular: Negative.  Gastrointestinal: Positive for constipation.  Endocrine: Negative.   Genitourinary: Negative.   Musculoskeletal: Negative.   Skin: Negative.   Allergic/Immunologic: Negative.   Neurological: Positive for numbness.  Hematological: Negative.   Psychiatric/Behavioral: Positive for confusion.  All other systems reviewed and are negative.      Objective:   Physical Exam  Awake, alert, appropriate, sitting up in manual w/c, accompanied by daughter, NAD L BKA - dry skin down to incision. Dog ears gone; mild serosanguinous drainage from middle slightly open spot on L BKA- some surrounding dried blood; mild edema; no TTP  R 5th metatarsal wound is C/D/I        Assessment & Plan:    . 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  Wound looks good/healing- probably 3-4 weeks from wound stops draining.  - con't wound care- doesn't need to see vascular surgery per them - as soon as wound on L BKA stops draining, can take prescription to Prosthetics, and get a shrinker- - f/u with Dr Dagoberto Ligas  AFTER gets shrinker and before get prosthesis. - then wil see him when gets prosthesis to write for therapy to get him walking again.    Spent a total of 20 minute son appointment- more than 10 educating pt on process/prosthesis.

## 2019-04-19 NOTE — Patient Instructions (Signed)
.   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  Wound looks good/healing- probably 3-4 weeks from wound stops draining.  - con't wound care- doesn't need to see vascular surgery per them - as soon as wound on L BKA stops draining, can take prescription to Prosthetics, and get a shrinker- - f/u with Dr Dagoberto Ligas  AFTER gets shrinker and before get prosthesis. - then wil see him when gets prosthesis to write for therapy to get him walking again.

## 2019-04-25 ENCOUNTER — Ambulatory Visit (INDEPENDENT_AMBULATORY_CARE_PROVIDER_SITE_OTHER): Payer: Medicare Other | Admitting: *Deleted

## 2019-04-25 DIAGNOSIS — I5022 Chronic systolic (congestive) heart failure: Secondary | ICD-10-CM

## 2019-04-25 DIAGNOSIS — I420 Dilated cardiomyopathy: Secondary | ICD-10-CM

## 2019-04-26 LAB — CUP PACEART REMOTE DEVICE CHECK
Battery Remaining Longevity: 54 mo
Battery Remaining Percentage: 60 %
Brady Statistic RV Percent Paced: 0 %
Date Time Interrogation Session: 20200914053100
HighPow Impedance: 102 Ohm
Implantable Lead Implant Date: 20111118
Implantable Lead Location: 753860
Implantable Lead Model: 181
Implantable Lead Serial Number: 313758
Implantable Pulse Generator Implant Date: 20111118
Lead Channel Impedance Value: 591 Ohm
Lead Channel Pacing Threshold Amplitude: 0.7 V
Lead Channel Pacing Threshold Pulse Width: 0.5 ms
Lead Channel Setting Pacing Amplitude: 2.4 V
Lead Channel Setting Pacing Pulse Width: 0.5 ms
Lead Channel Setting Sensing Sensitivity: 0.5 mV
Pulse Gen Serial Number: 268731

## 2019-04-28 ENCOUNTER — Other Ambulatory Visit: Payer: Self-pay

## 2019-04-28 ENCOUNTER — Ambulatory Visit (INDEPENDENT_AMBULATORY_CARE_PROVIDER_SITE_OTHER): Payer: Medicare Other | Admitting: Podiatry

## 2019-04-28 DIAGNOSIS — L97511 Non-pressure chronic ulcer of other part of right foot limited to breakdown of skin: Secondary | ICD-10-CM | POA: Diagnosis not present

## 2019-04-28 DIAGNOSIS — E08621 Diabetes mellitus due to underlying condition with foot ulcer: Secondary | ICD-10-CM

## 2019-05-03 NOTE — Progress Notes (Signed)
Subjective:  Patient ID: Timothy Faulkner, male    DOB: [birth year],  MRN: [id number]  No chief complaint on file.   73 y.o. male presents for wound care.  Patient back to his baseline denies pain denies concerns Review of Systems: Negative except as noted in the HPI. Denies N/V/F/Ch.  Past Medical History:  Diagnosis Date  . Anemia   . Arthritis    Gout  . Automatic implantable cardioverter-defibrillator in situ    [employer]  . CHF   . ESRD on dialysis [facility])    [name] 03/11/2013 (03/11/2013) dialysis M/W/F  . Gangrene ([place])    left foot  . GERD (gastroesophageal reflux disease)   . Gout    "once a year"  . Heart murmur   . HYPERCHOLESTEROLEMIA, MIXED   . HYPERTENSION   . Macular degeneration   . Osteomyelitis ([place])    right foot  . 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 Pacemaker   . Peripheral arterial disease (HCC)    left fifth toe ulcer, healing  . Pneumonia   . Shortness of breath   . 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 Wears dentures   . Wears glasses     Current Outpatient Medications:  .  acetaminophen (TYLENOL) 500 MG tablet, Take 500 mg by mouth every 6 (six) hours as needed for mild pain or headache., Disp: , Rfl:  .  Amino Acids-Protein Hydrolys (FEEDING SUPPLEMENT, PRO-STAT SUGAR FREE 64,) LIQD, Take 30 mLs by mouth 2 (two) times daily. (Patient not taking: Reported on 04/16/2019), Disp: 887 mL, Rfl: 0 .  aspirin EC 81 MG tablet, Take 1 tablet (81 mg total) by mouth daily., Disp: 150 tablet, Rfl: 2 .  AURYXIA 1 GM 210 MG(Fe) tablet, Take 2 tablets (420 mg total) by mouth 3 (three) times daily with meals. Take 2 tablets (420 mg) by mouth with each meal and with each snack, Disp: 180 tablet, Rfl: 0 .  bisacodyl (DULCOLAX) 5 MG EC tablet, Take 1 tablet (5 mg total) by mouth daily as needed for moderate constipation., Disp: 30 tablet, Rfl: 0 .  calcitRIOL (ROCALTROL) 0.5 MCG capsule, Take 1 capsule (0.5 mcg  total) by mouth every Monday, Wednesday, and Friday with hemodialysis., Disp: 30 capsule, Rfl: 0 .  Calcium Carbonate Antacid (CALCIUM CARBONATE, DOSED IN MG ELEMENTAL CALCIUM,) 1250 MG/5ML SUSP, Take 5 mLs (500 mg of elemental calcium total) by mouth every 6 (six) hours as needed for indigestion., Disp: 450 mL, Rfl: 0 .  cinacalcet (SENSIPAR) 30 MG tablet, Take 1 tablet (30 mg total) by mouth every other day., Disp: 60 tablet, Rfl: 0 .  clopidogrel (PLAVIX) 75 MG tablet, Take 1 tablet (75 mg total) by mouth daily with breakfast., Disp: 30 tablet, Rfl: 0 .  colchicine 0.6 MG tablet, Take 0.6 mg by mouth daily as needed (as directed for gout flares). , Disp: , Rfl: 1 .  collagenase (SANTYL) ointment, Apply topically daily. Apply as directed per home health nurse.  Apply to right foot wound edge to edge nickel thick.  Cover with wet gauze followed by dry gauze and change daily, Disp: 15 g, Rfl: 0 .  docusate sodium (COLACE) 100 MG capsule, Take 2 capsules (200 mg total) by mouth 2 (two) times daily., Disp: 10 capsule, Rfl: 0 .  methocarbamol (ROBAXIN) 500 MG tablet, Take 1 tablet (500 mg total) by mouth every 12 (twelve) hours as needed for muscle spasms.,  Disp: 60 tablet, Rfl: 0 .  midodrine (PROAMATINE) 10 MG tablet, Take 1 tablet (10 mg total) by mouth every Monday, Wednesday, and Friday with hemodialysis., Disp: 60 tablet, Rfl: 6 .  multivitamin (RENA-VIT) TABS tablet, Take 1 tablet by mouth at bedtime., Disp: 30 tablet, Rfl: 0 .  oxyCODONE (OXY IR/ROXICODONE) 5 MG immediate release tablet, Take 5 mg by mouth every 6 (six) hours as needed for severe pain., Disp: , Rfl:  .  rosuvastatin (CRESTOR) 10 MG tablet, Take 1 tablet (10 mg total) by mouth daily at 6 PM., Disp: 30 tablet, Rfl: 0  Social History   Tobacco Use  Smoking Status Former Smoker  . Packs/day: 0.75  . Years: 30.00  . Pack years: 22.50  . Types: Cigarettes  . Quit date: 11/25/1992  . Years since quitting: 26.4  Smokeless Tobacco  Never Used    No Known Allergies Objective:  There were no vitals filed for this visit. There is no height or weight on file to calculate BMI. Constitutional Well developed.  Vascular Right foot warm. Good capillary refill to remaining digits.  Neurologic Normal speech. Oriented to person, place, and time. Protective sensation absent  Dermatologic Wound Location: Right foot lateral 5th met area Wound Base: Granular/Healthy Peri-wound: Normal Exudate: None: wound tissue dry Wound Measurements: -2x2  Orthopedic: No pain to palpation.   Radiographs: None Assessment:   No diagnosis found. Plan:  Patient was evaluated and treated and all questions answered.  Ulcer right foot -Wound healing well fully granular no debridement performed today -Dressed with collagen gel -Continue Santyl -Follow-up in 2 weeks  No follow-ups on file.

## 2019-05-06 NOTE — Progress Notes (Signed)
Remote ICD transmission.   

## 2019-05-12 ENCOUNTER — Ambulatory Visit (INDEPENDENT_AMBULATORY_CARE_PROVIDER_SITE_OTHER): Payer: Medicare Other | Admitting: Podiatry

## 2019-05-12 ENCOUNTER — Other Ambulatory Visit: Payer: Self-pay

## 2019-05-12 DIAGNOSIS — E08621 Diabetes mellitus due to underlying condition with foot ulcer: Secondary | ICD-10-CM | POA: Diagnosis not present

## 2019-05-12 DIAGNOSIS — L928 Other granulomatous disorders of the skin and subcutaneous tissue: Secondary | ICD-10-CM | POA: Diagnosis not present

## 2019-05-12 DIAGNOSIS — L97511 Non-pressure chronic ulcer of other part of right foot limited to breakdown of skin: Secondary | ICD-10-CM

## 2019-05-12 MED ORDER — SILVER SULFADIAZINE 1 % EX CREA: TOPICAL_CREAM | CUTANEOUS | 0 refills | Status: AC

## 2019-05-12 NOTE — Progress Notes (Signed)
Subjective:  Patient ID: Timothy Faulkner, male    DOB: 1946/05/11,  MRN: [id number]  No chief complaint on file.   73 y.o. male presents for wound care.  States that the wound is doing well denies issues or concerns.  Currently using Santyl ointment on the wound daily.  Past Medical History:  Diagnosis Date  . Anemia   . Arthritis    Gout  . Automatic implantable cardioverter-defibrillator in situ    [employer]  . CHF   . ESRD on dialysis [facility])    [name] 03/11/2013 (03/11/2013) dialysis M/W/F  . Gangrene ([place])    left foot  . GERD (gastroesophageal reflux disease)   . Gout    "once a year"  . Heart murmur   . HYPERCHOLESTEROLEMIA, MIXED   . HYPERTENSION   . Macular degeneration   . Osteomyelitis ([place])    right foot  . 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 Pacemaker   . Peripheral arterial disease (HCC)    left fifth toe ulcer, healing  . Pneumonia   . Shortness of breath   . 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 Wears dentures   . Wears glasses     Current Outpatient Medications:  .  acetaminophen (TYLENOL) 500 MG tablet, Take 500 mg by mouth every 6 (six) hours as needed for mild pain or headache., Disp: , Rfl:  .  Amino Acids-Protein Hydrolys (FEEDING SUPPLEMENT, PRO-STAT SUGAR FREE 64,) LIQD, Take 30 mLs by mouth 2 (two) times daily. (Patient not taking: Reported on 04/16/2019), Disp: 887 mL, Rfl: 0 .  aspirin EC 81 MG tablet, Take 1 tablet (81 mg total) by mouth daily., Disp: 150 tablet, Rfl: 2 .  AURYXIA 1 GM 210 MG(Fe) tablet, Take 2 tablets (420 mg total) by mouth 3 (three) times daily with meals. Take 2 tablets (420 mg) by mouth with each meal and with each snack, Disp: 180 tablet, Rfl: 0 .  bisacodyl (DULCOLAX) 5 MG EC tablet, Take 1 tablet (5 mg total) by mouth daily as needed for moderate constipation., Disp: 30 tablet, Rfl: 0 .  calcitRIOL (ROCALTROL) 0.5 MCG capsule, Take 1 capsule (0.5 mcg total) by  mouth every Monday, Wednesday, and Friday with hemodialysis., Disp: 30 capsule, Rfl: 0 .  Calcium Carbonate Antacid (CALCIUM CARBONATE, DOSED IN MG ELEMENTAL CALCIUM,) 1250 MG/5ML SUSP, Take 5 mLs (500 mg of elemental calcium total) by mouth every 6 (six) hours as needed for indigestion., Disp: 450 mL, Rfl: 0 .  cinacalcet (SENSIPAR) 30 MG tablet, Take 1 tablet (30 mg total) by mouth every other day., Disp: 60 tablet, Rfl: 0 .  clopidogrel (PLAVIX) 75 MG tablet, Take 1 tablet (75 mg total) by mouth daily with breakfast., Disp: 30 tablet, Rfl: 0 .  colchicine 0.6 MG tablet, Take 0.6 mg by mouth daily as needed (as directed for gout flares). , Disp: , Rfl: 1 .  collagenase (SANTYL) ointment, Apply topically daily. Apply as directed per home health nurse.  Apply to right foot wound edge to edge nickel thick.  Cover with wet gauze followed by dry gauze and change daily, Disp: 15 g, Rfl: 0 .  docusate sodium (COLACE) 100 MG capsule, Take 2 capsules (200 mg total) by mouth 2 (two) times daily., Disp: 10 capsule, Rfl: 0 .  methocarbamol (ROBAXIN) 500 MG tablet, Take 1 tablet (500 mg total) by mouth every 12 (twelve) hours as needed for muscle spasms., Disp:  60 tablet, Rfl: 0 .  midodrine (PROAMATINE) 10 MG tablet, Take 1 tablet (10 mg total) by mouth every Monday, Wednesday, and Friday with hemodialysis., Disp: 60 tablet, Rfl: 6 .  multivitamin (RENA-VIT) TABS tablet, Take 1 tablet by mouth at bedtime., Disp: 30 tablet, Rfl: 0 .  oxyCODONE (OXY IR/ROXICODONE) 5 MG immediate release tablet, Take 5 mg by mouth every 6 (six) hours as needed for severe pain., Disp: , Rfl:  .  rosuvastatin (CRESTOR) 10 MG tablet, Take 1 tablet (10 mg total) by mouth daily at 6 PM., Disp: 30 tablet, Rfl: 0 .  silver sulfADIAZINE (SILVADENE) 1 % cream, Apply pea-sized amount to wound daily., Disp: 50 g, Rfl: 0  Social History   Tobacco Use  Smoking Status Former Smoker  . Packs/day: 0.75  . Years: 30.00  . Pack years: 22.50   . Types: Cigarettes  . Quit date: 11/25/1992  . Years since quitting: 26.4  Smokeless Tobacco Never Used    No Known Allergies Objective:  There were no vitals filed for this visit. There is no height or weight on file to calculate BMI. Constitutional Well developed.  Vascular Right foot warm. Good capillary refill to remaining digits.  Neurologic Normal speech. Oriented to person, place, and time. Protective sensation absent  Dermatologic Wound Location: Right foot lateral 5th met area Wound Base: Granular/Healthy Peri-wound: Normal Exudate: None: wound tissue dry Wound Measurements: -0.5 x 2  Orthopedic: No pain to palpation.   Radiographs: None Assessment:   1. 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  Ulcer right foot -Wound improving wound base cauterized with silver nitrate.  Advised to apply antibiotic ointment and Band-Aid but not to apply for the next 2 to 3 days  Return in about 4 weeks (around 06/09/2019) for Wound Care, Right.

## 2019-05-17 ENCOUNTER — Encounter: Payer: Self-pay | Admitting: Internal Medicine

## 2019-05-17 LAB — HM DIABETES EYE EXAM

## 2019-05-19 ENCOUNTER — Telehealth: Payer: Self-pay | Admitting: *Deleted

## 2019-05-19 NOTE — Telephone Encounter (Signed)
[name] states she needs supplies order from [employer]. I told [name] to have [facility] call for refill or send refill request.

## 2019-05-19 NOTE — Telephone Encounter (Signed)
Pt's dtr, [name] states she needs to speak with [name].

## 2019-05-31 ENCOUNTER — Encounter: Payer: Medicare Other | Admitting: Physical Medicine and Rehabilitation

## 2019-06-09 ENCOUNTER — Telehealth: Payer: Self-pay | Admitting: *Deleted

## 2019-06-09 ENCOUNTER — Other Ambulatory Visit: Payer: Self-pay

## 2019-06-09 ENCOUNTER — Ambulatory Visit: Payer: Medicare Other | Admitting: Podiatry

## 2019-06-09 DIAGNOSIS — L97511 Non-pressure chronic ulcer of other part of right foot limited to breakdown of skin: Secondary | ICD-10-CM

## 2019-06-09 DIAGNOSIS — E08621 Diabetes mellitus due to underlying condition with foot ulcer: Secondary | ICD-10-CM

## 2019-06-09 NOTE — Progress Notes (Signed)
Subjective:  Patient ID: Timothy Faulkner, male    DOB: 27-Aug-1945,  MRN: [id number]  Chief Complaint  Patient presents with  . Wound Check    Pt states no concerns, denies fever/nausea/vomiting/chills.     73 y.o. male presents for wound care.  Using silvadene on the wound denies concerns states the wound is healing well. Presents with daughter.  Past Medical History:  Diagnosis Date  . Anemia   . Arthritis    Gout  . Automatic implantable cardioverter-defibrillator in situ    [employer]  . CHF   . ESRD on dialysis [facility])    [name] 03/11/2013 (03/11/2013) dialysis M/W/F  . Gangrene ([place])    left foot  . GERD (gastroesophageal reflux disease)   . Gout    "once a year"  . Heart murmur   . HYPERCHOLESTEROLEMIA, MIXED   . HYPERTENSION   . Macular degeneration   . Osteomyelitis ([place])    right foot  . 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 Pacemaker   . Peripheral arterial disease (HCC)    left fifth toe ulcer, healing  . Pneumonia   . Shortness of breath   . 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 Wears dentures   . Wears glasses     Current Outpatient Medications:  .  acetaminophen (TYLENOL) 500 MG tablet, Take 500 mg by mouth every 6 (six) hours as needed for mild pain or headache., Disp: , Rfl:  .  Amino Acids-Protein Hydrolys (FEEDING SUPPLEMENT, PRO-STAT SUGAR FREE 64,) LIQD, Take 30 mLs by mouth 2 (two) times daily., Disp: 887 mL, Rfl: 0 .  amoxicillin-clavulanate (AUGMENTIN) 500-125 MG tablet, Take 1 tablet by mouth daily., Disp: , Rfl:  .  amoxicillin-clavulanate (AUGMENTIN) 875-125 MG tablet, Take by mouth., Disp: , Rfl:  .  aspirin EC 81 MG tablet, Take 1 tablet (81 mg total) by mouth daily., Disp: 150 tablet, Rfl: 2 .  AURYXIA 1 GM 210 MG(Fe) tablet, Take 420 mg by mouth 3 (three) times daily., Disp: , Rfl:  .  bisacodyl (DULCOLAX) 5 MG EC tablet, Take 1 tablet (5 mg total) by mouth daily as needed for  moderate constipation., Disp: 30 tablet, Rfl: 0 .  calcitRIOL (ROCALTROL) 0.5 MCG capsule, Take 1 capsule (0.5 mcg total) by mouth every Monday, Wednesday, and Friday with hemodialysis., Disp: 30 capsule, Rfl: 0 .  Calcium Carbonate Antacid (CALCIUM CARBONATE, DOSED IN MG ELEMENTAL CALCIUM,) 1250 MG/5ML SUSP, Take 5 mLs (500 mg of elemental calcium total) by mouth every 6 (six) hours as needed for indigestion., Disp: 450 mL, Rfl: 0 .  cinacalcet (SENSIPAR) 30 MG tablet, Take 1 tablet (30 mg total) by mouth every other day., Disp: 60 tablet, Rfl: 0 .  clopidogrel (PLAVIX) 75 MG tablet, Take 1 tablet (75 mg total) by mouth daily with breakfast., Disp: 30 tablet, Rfl: 0 .  colchicine 0.6 MG tablet, Take 0.6 mg by mouth daily as needed (as directed for gout flares). , Disp: , Rfl: 1 .  collagenase (SANTYL) ointment, Apply topically daily. Apply as directed per home health nurse.  Apply to right foot wound edge to edge nickel thick.  Cover with wet gauze followed by dry gauze and change daily, Disp: 15 g, Rfl: 0 .  docusate sodium (COLACE) 100 MG capsule, Take 2 capsules (200 mg total) by mouth 2 (two) times daily., Disp: 10 capsule, Rfl: 0 .  heparin 1000 UNIT/ML injection, Heparin Sodium (  Porcine) 1,000 Units/mL Catheter Lock Venous, Disp: , Rfl:  .  methocarbamol (ROBAXIN) 500 MG tablet, Take 1 tablet (500 mg total) by mouth every 12 (twelve) hours as needed for muscle spasms., Disp: 60 tablet, Rfl: 0 .  midodrine (PROAMATINE) 10 MG tablet, Take 1 tablet (10 mg total) by mouth every Monday, Wednesday, and Friday with hemodialysis., Disp: 60 tablet, Rfl: 6 .  Multiple Vitamin (MULTIVITAMIN) capsule, Take 1 capsule by mouth daily., Disp: , Rfl:  .  multivitamin (RENA-VIT) TABS tablet, Take 1 tablet by mouth at bedtime., Disp: 30 tablet, Rfl: 0 .  oxyCODONE (OXY IR/ROXICODONE) 5 MG immediate release tablet, Take 5 mg by mouth every 6 (six) hours as needed for severe pain., Disp: , Rfl:  .  rosuvastatin  (CRESTOR) 10 MG tablet, Take 1 tablet (10 mg total) by mouth daily at 6 PM., Disp: 30 tablet, Rfl: 0 .  saccharomyces boulardii (FLORASTOR) 250 MG capsule, Take by mouth., Disp: , Rfl:  .  silver sulfADIAZINE (SILVADENE) 1 % cream, Apply pea-sized amount to wound daily., Disp: 50 g, Rfl: 0 .  Wound Dressings (MEDIHONEY WOUND/BURN DRESSING) GEL, Apply   to affected area three times a week, Disp: , Rfl:   Social History   Tobacco Use  Smoking Status Former Smoker  . Packs/day: 0.75  . Years: 30.00  . Pack years: 22.50  . Types: Cigarettes  . Quit date: 11/25/1992  . Years since quitting: 26.5  Smokeless Tobacco Never Used    No Known Allergies Objective:  There were no vitals filed for this visit. There is no height or weight on file to calculate BMI. Constitutional Well developed.  Vascular Right foot warm. Good capillary refill to remaining digits.  Neurologic Normal speech. Oriented to person, place, and time. Protective sensation absent  Dermatologic Wound Location: Right foot lateral 5th met area Wound Base: Granular/Healthy Peri-wound: Normal Exudate: None: wound tissue dry Wound Measurements: -1x0.2  Orthopedic: No pain to palpation.   Radiographs: None Assessment:   1. Diabetic ulcer of other part of right foot associated with diabetes mellitus due to underlying condition, limited to breakdown of skin [facility])    Plan:  Patient was evaluated and treated and all questions answered.  Ulcer right foot -Wound improving almost completely healed. Rolled borders debrided with tissue nipper. Dressed with silvadene and DSD. F/u in 1 month for recheck -Checked on status of hospital bed patient was called by RN  Procedure: Selective Debridement of Wound Rationale: Removal of devitalized tissue from the wound to promote healing.  Pre-Debridement Wound Measurements: 1 cm x 0.2 cm x 0.1 cm  Post-Debridement Wound Measurements: same as pre-debridement. Type of Debridement: sharp  selective Tissue Removed: Devitalized soft-tissue Dressing: Dry, sterile, compression dressing. Disposition: Patient tolerated procedure well. Patient to return in 1 week for follow-up.     Return in about 4 weeks (around 07/07/2019).

## 2019-06-09 NOTE — Telephone Encounter (Signed)
Left message on pt's mobile phone informing pt of [facility] [phone] states they had call him concerning the hospital bed and co-pay and delivery. Unable to speak with pt on home phone, line was busy with 2 attempts.

## 2019-06-09 NOTE — Telephone Encounter (Signed)
I spoke with [facility] and they had tried to contact pt for the co-pay and to pick up the bed. I asked Freda Munro if pt could contact them, and she stated use [phone].

## 2019-06-09 NOTE — Telephone Encounter (Signed)
[name] states he signed papers for pt to get a hospital bed and pt states no one has called concerning the bed.

## 2019-06-15 ENCOUNTER — Other Ambulatory Visit: Payer: Self-pay | Admitting: Gastroenterology

## 2019-06-15 DIAGNOSIS — R748 Abnormal levels of other serum enzymes: Secondary | ICD-10-CM

## 2019-06-22 ENCOUNTER — Encounter: Payer: Medicare Other | Admitting: Physical Medicine and Rehabilitation

## 2019-06-23 ENCOUNTER — Ambulatory Visit
Admission: RE | Admit: 2019-06-23 | Discharge: 2019-06-23 | Disposition: A | Discharge status: Home / Self Care | Payer: Medicare Other | Source: Ambulatory Visit | Attending: Gastroenterology | Admitting: Gastroenterology

## 2019-06-23 DIAGNOSIS — R748 Abnormal levels of other serum enzymes: Secondary | ICD-10-CM

## 2019-06-23 MED ORDER — IOPAMIDOL (ISOVUE-300) INJECTION 61%
100.0000 mL | Freq: Once | INTRAVENOUS | Status: AC | PRN
  Administered 2019-06-23: 100 mL via INTRAVENOUS

## 2019-06-28 ENCOUNTER — Other Ambulatory Visit: Payer: Self-pay

## 2019-06-28 DIAGNOSIS — Z992 Dependence on renal dialysis: Secondary | ICD-10-CM

## 2019-06-28 DIAGNOSIS — N186 End stage renal disease: Secondary | ICD-10-CM

## 2019-06-30 ENCOUNTER — Ambulatory Visit (INDEPENDENT_AMBULATORY_CARE_PROVIDER_SITE_OTHER)
Admission: RE | Admit: 2019-06-30 | Discharge: 2019-06-30 | Disposition: A | Discharge status: Home / Self Care | Payer: Medicare Other | Source: Ambulatory Visit | Attending: Family | Admitting: Family

## 2019-06-30 ENCOUNTER — Ambulatory Visit (INDEPENDENT_AMBULATORY_CARE_PROVIDER_SITE_OTHER): Payer: Medicare Other | Admitting: Vascular Surgery

## 2019-06-30 ENCOUNTER — Encounter: Payer: Self-pay | Admitting: Vascular Surgery

## 2019-06-30 ENCOUNTER — Ambulatory Visit (HOSPITAL_COMMUNITY)
Admission: RE | Admit: 2019-06-30 | Discharge: 2019-06-30 | Disposition: A | Discharge status: Home / Self Care | Payer: Medicare Other | Source: Ambulatory Visit | Attending: Family | Admitting: Family

## 2019-06-30 ENCOUNTER — Other Ambulatory Visit: Payer: Self-pay

## 2019-06-30 VITALS — BP 138/64 | Temp 97.1°F | Resp 20 | Ht 68.0 in | Wt 159.0 lb

## 2019-06-30 DIAGNOSIS — N186 End stage renal disease: Secondary | ICD-10-CM

## 2019-06-30 DIAGNOSIS — Z992 Dependence on renal dialysis: Secondary | ICD-10-CM

## 2019-06-30 DIAGNOSIS — I739 Peripheral vascular disease, unspecified: Secondary | ICD-10-CM | POA: Diagnosis not present

## 2019-06-30 NOTE — Progress Notes (Signed)
Patient is a 73 year old male who returns for follow-up today.  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  He was also sent for evaluation of his left upper arm AV fistula.  This was placed in September 2019.  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  Unfortunately these images are not available for review.  The patient states he has never had any swelling in his left upper arm.  He has had a prior left forearm fistula as well.  Consideration was being given for placing an AV graft in his right arm but we were reluctant proved proceed with that while the patient had an open ongoing wound in his right foot due to risk of infection of an AV graft.  Currently the patient is dialyzing via left side dialysis catheter.  He also has a left-sided AICD.  He did have a central venogram by Dr. Scot Dock several months ago which shows the right central venous system was patent.  Patient also states he recently had some trauma to his left below-knee amputation and wished to have this checked today.  He has no incisional drainage.  Review of systems: He has shortness of breath with exertion.  He has no chest pain.  Past Medical History:  Diagnosis Date  . Anemia   . Arthritis    Gout  . Automatic implantable cardioverter-defibrillator in situ    [employer]  . CHF   . ESRD on dialysis [facility])    [name] 03/11/2013 (03/11/2013) dialysis M/W/F  . Gangrene ([place])    left foot  . GERD (gastroesophageal reflux disease)   . Gout    "once a year"  . Heart murmur   . HYPERCHOLESTEROLEMIA, MIXED   . HYPERTENSION   . Macular degeneration   . Osteomyelitis ([place])    right foot  . 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 Pacemaker   . Peripheral arterial disease (HCC)    left fifth toe ulcer, healing  . Pneumonia   . Shortness of breath   . 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 Wears dentures   . Wears glasses     Past Surgical History:  Procedure Laterality Date  . A/V FISTULAGRAM Left 07/20/2018   Procedure: A/V FISTULAGRAM;  Surgeon: [name], MD;  Location: [place] CV [facility];  Service: Cardiovascular;  Laterality: Left;  . ABDOMINAL AORTOGRAM N/A 02/25/2019   Procedure: ABDOMINAL AORTOGRAM;  Surgeon: Elam Dutch, MD;  Location: [place] CV LAB;  Service: Cardiovascular;  Laterality: N/A;  . ABDOMINAL AORTOGRAM W/LOWER EXTREMITY Bilateral 07/20/2018   Procedure: ABDOMINAL AORTOGRAM W/LOWER EXTREMITY;  Surgeon: [name], MD;  Location: [place] CV LAB;  Service: Cardiovascular;  Laterality: Bilateral;  . AMPUTATION Right 09/07/2018   Procedure: Right fifth metatarsectomy;  Surgeon: [name], DPM;  Location: [place];  Service: Podiatry;  Laterality: Right;  . AMPUTATION Left 03/08/2019   Procedure: AMPUTATION  3RD AND 4TH TOES LEFT FOOT;  Surgeon: [name], DPM;  Location: [place];  Service: Podiatry;  Laterality: Left;  . AMPUTATION Left 03/15/2019   Procedure: AMPUTATION BELOW KNEE;  Surgeon: Elam Dutch, MD;  Location: [facility] OR;  Service: Vascular;  Laterality: Left;  . AV FISTULA PLACEMENT Right 12/13/2012   Procedure: ARTERIOVENOUS (AV) FISTULA CREATION;  Surgeon: [name], MD;  Location: [place];  Service: Vascular;  Laterality: Right;  Ultrasound guided  . AV FISTULA PLACEMENT Left 05/07/2016   Procedure: LEFT RADIOCEPHALIC ARTERIOVENOUS (AV) FISTULA CREATION;  Surgeon: [name], MD;  Location: [place];  Service: Vascular;  Laterality: Left;  . BASCILIC VEIN TRANSPOSITION Right 03/26/2016   Procedure: RIGHT BASILIC VEIN TRANSPOSITION;  Surgeon: [name], MD;  Location: [place];  Service: Vascular;  Laterality: Right;  . CARDIAC CATHETERIZATION    . CARDIAC DEFIBRILLATOR PLACEMENT     Chemical engineer  . EYE SURGERY Bilateral    Cataract  . FISTULOGRAM Left 04/22/2018   Procedure: FISTULOGRAM UPPER EXTREMITY;   Surgeon: [name], MD;  Location: [place];  Service: Vascular;  Laterality: Left;  . I&D EXTREMITY Right 07/15/2018   Procedure: IRRIGATION AND DEBRIDEMENT RIGHT FOOT;  Surgeon: [name], DPM;  Location: [place];  Service: Podiatry;  Laterality: Right;  . INCISION AND DRAINAGE ABSCESS / HEMATOMA OF BURSA / KNEE / THIGH Left 2012   "knee" (03/11/2013)  . INSERT / REPLACE / REMOVE PACEMAKER    . INSERTION OF DIALYSIS CATHETER Left 04/22/2018   Procedure: INSERTION OF DIALYSIS CATHETER;  Surgeon: [name], MD;  Location: [place];  Service: Vascular;  Laterality: Left;  . IR FLUORO GUIDE CV LINE LEFT  07/15/2018  . IR PTA VENOUS EXCEPT DIALYSIS CIRCUIT  07/15/2018  . LOWER EXTREMITY ANGIOGRAPHY Right 07/21/2018   Procedure: LOWER EXTREMITY ANGIOGRAPHY;  Surgeon: [name], MD;  Location: [place] CV LAB;  Service: Cardiovascular;  Laterality: Right;  . LOWER EXTREMITY ANGIOGRAPHY Bilateral 02/25/2019   Procedure: Lower Extremity Angiography;  Surgeon: Elam Dutch, MD;  Location: [place] CV LAB;  Service: Cardiovascular;  Laterality: Bilateral;  . METATARSAL HEAD EXCISION Right 07/15/2018   Procedure: METATARSAL RESECTION;  Surgeon: [name], DPM;  Location: [place];  Service: Podiatry;  Laterality: Right;  . MULTIPLE TOOTH EXTRACTIONS    . PERIPHERAL VASCULAR ATHERECTOMY Right 02/28/2019   Procedure: PERIPHERAL VASCULAR ATHERECTOMY;  Surgeon: Waynetta Sandy, MD;  Location: [place] CV LAB;  Service: Cardiovascular;  Laterality: Right;  right tp trunk   . PERIPHERAL VASCULAR BALLOON ANGIOPLASTY Left 02/25/2019   Procedure: PERIPHERAL VASCULAR BALLOON ANGIOPLASTY;  Surgeon: Elam Dutch, MD;  Location: [place] CV LAB;  Service: Cardiovascular;  Laterality: Left;  tibial peroneal trunk and PT  . PERIPHERAL VASCULAR INTERVENTION Right 07/21/2018   Procedure: PERIPHERAL VASCULAR INTERVENTION;  Surgeon: [name], MD;   Location: [place] CV LAB;  Service: Cardiovascular;  Laterality: Right;  peroneal stents  . REVISON OF ARTERIOVENOUS FISTULA Right 5/88/5027   Procedure: Plication OF Right Arm ARTERIOVENOUS FISTULA;  Surgeon: Elam Dutch, MD;  Location: [facility] OR;  Service: Vascular;  Laterality: Right;  . REVISON OF ARTERIOVENOUS FISTULA Left 04/22/2018   Procedure: REVISION OF RADIOCEPHALIC ARTERIOVENOUS FISTULA;  Surgeon: [name], MD;  Location: [place];  Service: Vascular;  Laterality: Left;  . SHUNTOGRAM N/A 05/31/2013   Procedure: [place];  Surgeon: [name], MD;  Location: [facility] CATH LAB;  Service: Cardiovascular;  Laterality: N/A;  . UPPER EXTREMITY VENOGRAPHY Right 07/23/2018   Procedure: UPPER EXTREMITY VENOGRAPHY;  Surgeon: [name], MD;  Location: [place] [facility];  Service: Cardiovascular;  Laterality: Right;  . WOUND DEBRIDEMENT Right 07/17/2018   Procedure: Wound Debridement; Closure Filleted toe flap Right Foot;  Surgeon: [name], DPM;  Location: [place];  Service: Podiatry;  Laterality: Right;  . WOUND DEBRIDEMENT Right 09/07/2018  Procedure: Debridement Right Foot Wound, application of wound vac;  Surgeon: [name], DPM;  Location: [place];  Service: Podiatry;  Laterality: Right;  . WOUND DEBRIDEMENT Right 03/08/2019   Procedure: DEBRIDEMENT WOUND RIGHT FOOT;  Surgeon: [name], DPM;  Location: [place];  Service: Podiatry;  Laterality: Right;   Current Outpatient Medications on File Prior to Visit  Medication Sig Dispense Refill  . acetaminophen (TYLENOL) 500 MG tablet Take 500 mg by mouth every 6 (six) hours as needed for mild pain or headache.    . Amino Acids-Protein Hydrolys (FEEDING SUPPLEMENT, PRO-STAT SUGAR FREE 64,) LIQD Take 30 mLs by mouth 2 (two) times daily. 887 mL 0  . aspirin EC 81 MG tablet Take 1 tablet (81 mg total) by mouth daily. 150 tablet 2  . AURYXIA 1 GM 210 MG(Fe) tablet Take 420 mg by mouth 3 (three) times daily.     . bisacodyl (DULCOLAX) 5 MG EC tablet Take 1 tablet (5 mg total) by mouth daily as needed for moderate constipation. 30 tablet 0  . calcitRIOL (ROCALTROL) 0.5 MCG capsule Take 1 capsule (0.5 mcg total) by mouth every Monday, Wednesday, and Friday with hemodialysis. 30 capsule 0  . Calcium Carbonate Antacid (CALCIUM CARBONATE, DOSED IN MG ELEMENTAL CALCIUM,) 1250 MG/5ML SUSP Take 5 mLs (500 mg of elemental calcium total) by mouth every 6 (six) hours as needed for indigestion. 450 mL 0  . cinacalcet (SENSIPAR) 30 MG tablet Take 1 tablet (30 mg total) by mouth every other day. 60 tablet 0  . clopidogrel (PLAVIX) 75 MG tablet Take 1 tablet (75 mg total) by mouth daily with breakfast. 30 tablet 0  . colchicine 0.6 MG tablet Take 0.6 mg by mouth daily as needed (as directed for gout flares).   1  . collagenase (SANTYL) ointment Apply topically daily. Apply as directed per home health nurse.  Apply to right foot wound edge to edge nickel thick.  Cover with wet gauze followed by dry gauze and change daily 15 g 0  . docusate sodium (COLACE) 100 MG capsule Take 2 capsules (200 mg total) by mouth 2 (two) times daily. 10 capsule 0  . heparin 1000 UNIT/ML injection Heparin Sodium (Porcine) 1,000 Units/mL Catheter Lock Venous    . methocarbamol (ROBAXIN) 500 MG tablet Take 1 tablet (500 mg total) by mouth every 12 (twelve) hours as needed for muscle spasms. 60 tablet 0  . midodrine (PROAMATINE) 10 MG tablet Take 1 tablet (10 mg total) by mouth every Monday, Wednesday, and Friday with hemodialysis. 60 tablet 6  . Multiple Vitamin (MULTIVITAMIN) capsule Take 1 capsule by mouth daily.    . multivitamin (RENA-VIT) TABS tablet Take 1 tablet by mouth at bedtime. 30 tablet 0  . oxyCODONE (OXY IR/ROXICODONE) 5 MG immediate release tablet Take 5 mg by mouth every 6 (six) hours as needed for severe pain.    . rosuvastatin (CRESTOR) 10 MG tablet Take 1 tablet (10 mg total) by mouth daily at 6 PM. 30 tablet 0  .  saccharomyces boulardii (FLORASTOR) 250 MG capsule Take by mouth.    . silver sulfADIAZINE (SILVADENE) 1 % cream Apply pea-sized amount to wound daily. 50 g 0  . Wound Dressings (MEDIHONEY WOUND/BURN DRESSING) GEL Apply   to affected area three times a week     No current facility-administered medications on file prior to visit.    Physical exam:  Vitals:   06/30/19 1341  BP: 138/64  Resp: 20  Temp: [place] Kitchen)  97.1 F (36.2 C)  Weight: 159 lb (72.1 kg)  Height: 5\' 8"  (1.727 m)    Extremities: 2+ brachial pulse absent radial pulse bilaterally occluded left forearm AV fistula minimal flow palpated in the left upper arm AV fistula occluded right upper arm fistula  Left BKA dry eschar removed 1 cm x 2 mm skin opening which is clean and granulating  Data: Patient had a duplex ultrasound of his left arm today which showed small radial artery with monophasic flow 2 mm brachial artery was 5 mm the AV fistula was patent with a diameter of 5 to 6 mm.  Assessment: Patient still has a wound on his right foot.  He may have some risk of developing a infection in her right arm AV graft.  His fistula on the left arm is still patent.  I believe we should at least try to see if we can salvage this first and we will repeat his fistulogram before proceeding with a right arm graft placement.  Since the wound is being managed currently by [name] and there is no active ongoing infection I do believe her right arm AV graft would have lower risk of infection than the catheter.  If he truly does have central venous occlusion on the left side that cannot be recovered with an intervention then we would proceed with a right upper arm AV graft.  Plan: Left arm fistulogram scheduled for first week of December and we will adjust his dialysis day to accommodate that.  Depending on the fistulogram findings we will decide on whether or not the left arm fistula is usable versus whether or not to place a right upper arm  graft.  Patient will continue to protect his left below-knee amputation and this should heal within the next few weeks.  Ruta Hinds, MD [facility] Office: [phone]

## 2019-06-30 NOTE — H&P (View-Only) (Signed)
Patient is a 73 year old male who returns for follow-up today.  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  He was also sent for evaluation of his left upper arm AV fistula.  This was placed in September 2019.  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  Unfortunately these images are not available for review.  The patient states he has never had any swelling in his left upper arm.  He has had a prior left forearm fistula as well.  Consideration was being given for placing an AV graft in his right arm but we were reluctant proved proceed with that while the patient had an open ongoing wound in his right foot due to risk of infection of an AV graft.  Currently the patient is dialyzing via left side dialysis catheter.  He also has a left-sided AICD.  He did have a central venogram by Dr. Scot Dock several months ago which shows the right central venous system was patent.  Patient also states he recently had some trauma to his left below-knee amputation and wished to have this checked today.  He has no incisional drainage.  Review of systems: He has shortness of breath with exertion.  He has no chest pain.  Past Medical History:  Diagnosis Date  . Anemia   . Arthritis    Gout  . Automatic implantable cardioverter-defibrillator in situ    [employer]  . CHF   . ESRD on dialysis [facility])    [name] 03/11/2013 (03/11/2013) dialysis M/W/F  . Gangrene ([place])    left foot  . GERD (gastroesophageal reflux disease)   . Gout    "once a year"  . Heart murmur   . HYPERCHOLESTEROLEMIA, MIXED   . HYPERTENSION   . Macular degeneration   . Osteomyelitis ([place])    right foot  . 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 Pacemaker   . Peripheral arterial disease (HCC)    left fifth toe ulcer, healing  . Pneumonia   . Shortness of breath   . 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 Wears dentures   . Wears glasses     Past Surgical History:  Procedure Laterality Date  . A/V FISTULAGRAM Left 07/20/2018   Procedure: A/V FISTULAGRAM;  Surgeon: [name], MD;  Location: [place] [facility];  Service: Cardiovascular;  Laterality: Left;  . ABDOMINAL AORTOGRAM N/A 02/25/2019   Procedure: ABDOMINAL AORTOGRAM;  Surgeon: Elam Dutch, MD;  Location: [place] [facility];  Service: Cardiovascular;  Laterality: N/A;  . ABDOMINAL AORTOGRAM W/LOWER EXTREMITY Bilateral 07/20/2018   Procedure: ABDOMINAL AORTOGRAM W/LOWER EXTREMITY;  Surgeon: [name], MD;  Location: [place] [facility];  Service: Cardiovascular;  Laterality: Bilateral;  . AMPUTATION Right 09/07/2018   Procedure: Right fifth metatarsectomy;  Surgeon: [name], DPM;  Location: [place];  Service: Podiatry;  Laterality: Right;  . AMPUTATION Left 03/08/2019   Procedure: AMPUTATION  3RD AND 4TH TOES LEFT FOOT;  Surgeon: [name], DPM;  Location: [place];  Service: Podiatry;  Laterality: Left;  . AMPUTATION Left 03/15/2019   Procedure: AMPUTATION BELOW KNEE;  Surgeon: Elam Dutch, MD;  Location: [facility] OR;  Service: Vascular;  Laterality: Left;  . AV FISTULA PLACEMENT Right 12/13/2012   Procedure: ARTERIOVENOUS (AV) FISTULA CREATION;  Surgeon: [name], MD;  Location: [place];  Service: Vascular;  Laterality: Right;  Ultrasound guided  . AV FISTULA PLACEMENT Left 05/07/2016   Procedure: LEFT RADIOCEPHALIC ARTERIOVENOUS (AV) FISTULA CREATION;  Surgeon: [name], MD;  Location: [place];  Service: Vascular;  Laterality: Left;  . BASCILIC VEIN TRANSPOSITION Right 03/26/2016   Procedure: RIGHT BASILIC VEIN TRANSPOSITION;  Surgeon: [name], MD;  Location: [place];  Service: Vascular;  Laterality: Right;  . CARDIAC CATHETERIZATION    . CARDIAC DEFIBRILLATOR PLACEMENT     Chemical engineer  . EYE SURGERY Bilateral    Cataract  . FISTULOGRAM Left 04/22/2018   Procedure: FISTULOGRAM UPPER EXTREMITY;   Surgeon: [name], MD;  Location: [place];  Service: Vascular;  Laterality: Left;  . I&D EXTREMITY Right 07/15/2018   Procedure: IRRIGATION AND DEBRIDEMENT RIGHT FOOT;  Surgeon: [name], DPM;  Location: [place];  Service: Podiatry;  Laterality: Right;  . INCISION AND DRAINAGE ABSCESS / HEMATOMA OF BURSA / KNEE / THIGH Left 2012   "knee" (03/11/2013)  . INSERT / REPLACE / REMOVE PACEMAKER    . INSERTION OF DIALYSIS CATHETER Left 04/22/2018   Procedure: INSERTION OF DIALYSIS CATHETER;  Surgeon: [name], MD;  Location: [place];  Service: Vascular;  Laterality: Left;  . IR FLUORO GUIDE CV LINE LEFT  07/15/2018  . IR PTA VENOUS EXCEPT DIALYSIS CIRCUIT  07/15/2018  . LOWER EXTREMITY ANGIOGRAPHY Right 07/21/2018   Procedure: LOWER EXTREMITY ANGIOGRAPHY;  Surgeon: [name], MD;  Location: [place] [facility];  Service: Cardiovascular;  Laterality: Right;  . LOWER EXTREMITY ANGIOGRAPHY Bilateral 02/25/2019   Procedure: Lower Extremity Angiography;  Surgeon: Elam Dutch, MD;  Location: [place] [facility];  Service: Cardiovascular;  Laterality: Bilateral;  . METATARSAL HEAD EXCISION Right 07/15/2018   Procedure: METATARSAL RESECTION;  Surgeon: [name], DPM;  Location: [place];  Service: Podiatry;  Laterality: Right;  . MULTIPLE TOOTH EXTRACTIONS    . PERIPHERAL VASCULAR ATHERECTOMY Right 02/28/2019   Procedure: PERIPHERAL VASCULAR ATHERECTOMY;  Surgeon: Waynetta Sandy, MD;  Location: [place] [facility];  Service: Cardiovascular;  Laterality: Right;  right tp trunk   . PERIPHERAL VASCULAR BALLOON ANGIOPLASTY Left 02/25/2019   Procedure: PERIPHERAL VASCULAR BALLOON ANGIOPLASTY;  Surgeon: Elam Dutch, MD;  Location: [place] [facility];  Service: Cardiovascular;  Laterality: Left;  tibial peroneal trunk and PT  . PERIPHERAL VASCULAR INTERVENTION Right 07/21/2018   Procedure: PERIPHERAL VASCULAR INTERVENTION;  Surgeon: [name], MD;   Location: [place] [facility];  Service: Cardiovascular;  Laterality: Right;  peroneal stents  . REVISON OF ARTERIOVENOUS FISTULA Right 0/27/7412   Procedure: Plication OF Right Arm ARTERIOVENOUS FISTULA;  Surgeon: Elam Dutch, MD;  Location: [facility] OR;  Service: Vascular;  Laterality: Right;  . REVISON OF ARTERIOVENOUS FISTULA Left 04/22/2018   Procedure: REVISION OF RADIOCEPHALIC ARTERIOVENOUS FISTULA;  Surgeon: [name], MD;  Location: [place];  Service: Vascular;  Laterality: Left;  . SHUNTOGRAM N/A 05/31/2013   Procedure: Earney Mallet;  Surgeon: [name], MD;  Location: [facility] CATH LAB;  Service: Cardiovascular;  Laterality: N/A;  . UPPER EXTREMITY VENOGRAPHY Right 07/23/2018   Procedure: UPPER EXTREMITY VENOGRAPHY;  Surgeon: [name], MD;  Location: [place] [facility];  Service: Cardiovascular;  Laterality: Right;  . WOUND DEBRIDEMENT Right 07/17/2018   Procedure: Wound Debridement; Closure Filleted toe flap Right Foot;  Surgeon: [name], DPM;  Location: [place];  Service: Podiatry;  Laterality: Right;  . WOUND DEBRIDEMENT Right 09/07/2018  Procedure: Debridement Right Foot Wound, application of wound vac;  Surgeon: [name], DPM;  Location: [place];  Service: Podiatry;  Laterality: Right;  . WOUND DEBRIDEMENT Right 03/08/2019   Procedure: DEBRIDEMENT WOUND RIGHT FOOT;  Surgeon: [name], DPM;  Location: [place];  Service: Podiatry;  Laterality: Right;   Current Outpatient Medications on File Prior to Visit  Medication Sig Dispense Refill  . acetaminophen (TYLENOL) 500 MG tablet Take 500 mg by mouth every 6 (six) hours as needed for mild pain or headache.    . Amino Acids-Protein Hydrolys (FEEDING SUPPLEMENT, PRO-STAT SUGAR FREE 64,) LIQD Take 30 mLs by mouth 2 (two) times daily. 887 mL 0  . aspirin EC 81 MG tablet Take 1 tablet (81 mg total) by mouth daily. 150 tablet 2  . AURYXIA 1 GM 210 MG(Fe) tablet Take 420 mg by mouth 3 (three) times daily.     . bisacodyl (DULCOLAX) 5 MG EC tablet Take 1 tablet (5 mg total) by mouth daily as needed for moderate constipation. 30 tablet 0  . calcitRIOL (ROCALTROL) 0.5 MCG capsule Take 1 capsule (0.5 mcg total) by mouth every Monday, Wednesday, and Friday with hemodialysis. 30 capsule 0  . Calcium Carbonate Antacid (CALCIUM CARBONATE, DOSED IN MG ELEMENTAL CALCIUM,) 1250 MG/5ML SUSP Take 5 mLs (500 mg of elemental calcium total) by mouth every 6 (six) hours as needed for indigestion. 450 mL 0  . cinacalcet (SENSIPAR) 30 MG tablet Take 1 tablet (30 mg total) by mouth every other day. 60 tablet 0  . clopidogrel (PLAVIX) 75 MG tablet Take 1 tablet (75 mg total) by mouth daily with breakfast. 30 tablet 0  . colchicine 0.6 MG tablet Take 0.6 mg by mouth daily as needed (as directed for gout flares).   1  . collagenase (SANTYL) ointment Apply topically daily. Apply as directed per home health nurse.  Apply to right foot wound edge to edge nickel thick.  Cover with wet gauze followed by dry gauze and change daily 15 g 0  . docusate sodium (COLACE) 100 MG capsule Take 2 capsules (200 mg total) by mouth 2 (two) times daily. 10 capsule 0  . heparin 1000 UNIT/ML injection Heparin Sodium (Porcine) 1,000 Units/mL Catheter Lock Venous    . methocarbamol (ROBAXIN) 500 MG tablet Take 1 tablet (500 mg total) by mouth every 12 (twelve) hours as needed for muscle spasms. 60 tablet 0  . midodrine (PROAMATINE) 10 MG tablet Take 1 tablet (10 mg total) by mouth every Monday, Wednesday, and Friday with hemodialysis. 60 tablet 6  . Multiple Vitamin (MULTIVITAMIN) capsule Take 1 capsule by mouth daily.    . multivitamin (RENA-VIT) TABS tablet Take 1 tablet by mouth at bedtime. 30 tablet 0  . oxyCODONE (OXY IR/ROXICODONE) 5 MG immediate release tablet Take 5 mg by mouth every 6 (six) hours as needed for severe pain.    . rosuvastatin (CRESTOR) 10 MG tablet Take 1 tablet (10 mg total) by mouth daily at 6 PM. 30 tablet 0  .  saccharomyces boulardii (FLORASTOR) 250 MG capsule Take by mouth.    . silver sulfADIAZINE (SILVADENE) 1 % cream Apply pea-sized amount to wound daily. 50 g 0  . Wound Dressings (MEDIHONEY WOUND/BURN DRESSING) GEL Apply   to affected area three times a week     No current facility-administered medications on file prior to visit.    Physical exam:  Vitals:   06/30/19 1341  BP: 138/64  Resp: 20  Temp: Marland Kitchen)  97.1 F (36.2 C)  Weight: 159 lb (72.1 kg)  Height: 5\' 8"  (1.727 m)    Extremities: 2+ brachial pulse absent radial pulse bilaterally occluded left forearm AV fistula minimal flow palpated in the left upper arm AV fistula occluded right upper arm fistula  Left BKA dry eschar removed 1 cm x 2 mm skin opening which is clean and granulating  Data: Patient had a duplex ultrasound of his left arm today which showed small radial artery with monophasic flow 2 mm brachial artery was 5 mm the AV fistula was patent with a diameter of 5 to 6 mm.  Assessment: Patient still has a wound on his right foot.  He may have some risk of developing a infection in her right arm AV graft.  His fistula on the left arm is still patent.  I believe we should at least try to see if we can salvage this first and we will repeat his fistulogram before proceeding with a right arm graft placement.  Since the wound is being managed currently by [name] and there is no active ongoing infection I do believe her right arm AV graft would have lower risk of infection than the catheter.  If he truly does have central venous occlusion on the left side that cannot be recovered with an intervention then we would proceed with a right upper arm AV graft.  Plan: Left arm fistulogram scheduled for first week of December and we will adjust his dialysis day to accommodate that.  Depending on the fistulogram findings we will decide on whether or not the left arm fistula is usable versus whether or not to place a right upper arm  graft.  Patient will continue to protect his left below-knee amputation and this should heal within the next few weeks.  Ruta Hinds, MD [facility] [place] Office: [phone]

## 2019-07-04 ENCOUNTER — Other Ambulatory Visit: Payer: Self-pay

## 2019-07-12 ENCOUNTER — Other Ambulatory Visit (HOSPITAL_COMMUNITY)
Admission: RE | Admit: 2019-07-12 | Discharge: 2019-07-12 | Disposition: A | Discharge status: Home / Self Care | Payer: Medicare Other | Source: Ambulatory Visit | Attending: Vascular Surgery | Admitting: Vascular Surgery

## 2019-07-12 DIAGNOSIS — Z20828 Contact with and (suspected) exposure to other viral communicable diseases: Secondary | ICD-10-CM | POA: Insufficient documentation

## 2019-07-12 DIAGNOSIS — Z01812 Encounter for preprocedural laboratory examination: Secondary | ICD-10-CM | POA: Diagnosis present

## 2019-07-14 ENCOUNTER — Ambulatory Visit (INDEPENDENT_AMBULATORY_CARE_PROVIDER_SITE_OTHER): Payer: Medicare Other | Admitting: Podiatry

## 2019-07-14 ENCOUNTER — Other Ambulatory Visit: Payer: Self-pay

## 2019-07-14 DIAGNOSIS — L97511 Non-pressure chronic ulcer of other part of right foot limited to breakdown of skin: Secondary | ICD-10-CM | POA: Diagnosis not present

## 2019-07-14 DIAGNOSIS — E08621 Diabetes mellitus due to underlying condition with foot ulcer: Secondary | ICD-10-CM

## 2019-07-14 LAB — NOVEL CORONAVIRUS, NAA (HOSP ORDER, SEND-OUT TO REF LAB; TAT 18-24 HRS): SARS-CoV-2, NAA: NOT DETECTED

## 2019-07-15 ENCOUNTER — Ambulatory Visit (HOSPITAL_COMMUNITY)
Admission: RE | Admit: 2019-07-15 | Discharge: 2019-07-15 | Disposition: A | Discharge status: Home / Self Care | Payer: Medicare Other | Attending: Vascular Surgery | Admitting: Vascular Surgery

## 2019-07-15 ENCOUNTER — Encounter (HOSPITAL_COMMUNITY): Payer: Self-pay | Admitting: Vascular Surgery

## 2019-07-15 ENCOUNTER — Encounter (HOSPITAL_COMMUNITY)
Admission: RE | Disposition: A | Discharge status: Home / Self Care | Payer: Self-pay | Source: Home / Self Care | Attending: Vascular Surgery

## 2019-07-15 ENCOUNTER — Other Ambulatory Visit: Payer: Self-pay

## 2019-07-15 DIAGNOSIS — Z89512 Acquired absence of left leg below knee: Secondary | ICD-10-CM | POA: Insufficient documentation

## 2019-07-15 DIAGNOSIS — M109 Gout, unspecified: Secondary | ICD-10-CM | POA: Diagnosis not present

## 2019-07-15 DIAGNOSIS — E7800 Pure hypercholesterolemia, unspecified: Secondary | ICD-10-CM | POA: Diagnosis not present

## 2019-07-15 DIAGNOSIS — E1122 Type 2 diabetes mellitus with diabetic chronic kidney disease: Secondary | ICD-10-CM | POA: Diagnosis not present

## 2019-07-15 DIAGNOSIS — Z992 Dependence on renal dialysis: Secondary | ICD-10-CM | POA: Insufficient documentation

## 2019-07-15 DIAGNOSIS — N186 End stage renal disease: Secondary | ICD-10-CM | POA: Insufficient documentation

## 2019-07-15 DIAGNOSIS — M1990 Unspecified osteoarthritis, unspecified site: Secondary | ICD-10-CM | POA: Diagnosis not present

## 2019-07-15 DIAGNOSIS — K219 Gastro-esophageal reflux disease without esophagitis: Secondary | ICD-10-CM | POA: Insufficient documentation

## 2019-07-15 DIAGNOSIS — I509 Heart failure, unspecified: Secondary | ICD-10-CM | POA: Insufficient documentation

## 2019-07-15 DIAGNOSIS — Z7902 Long term (current) use of antithrombotics/antiplatelets: Secondary | ICD-10-CM | POA: Insufficient documentation

## 2019-07-15 DIAGNOSIS — T82510A Breakdown (mechanical) of surgically created arteriovenous fistula, initial encounter: Secondary | ICD-10-CM | POA: Diagnosis present

## 2019-07-15 DIAGNOSIS — E1151 Type 2 diabetes mellitus with diabetic peripheral angiopathy without gangrene: Secondary | ICD-10-CM | POA: Insufficient documentation

## 2019-07-15 DIAGNOSIS — Z79899 Other long term (current) drug therapy: Secondary | ICD-10-CM | POA: Insufficient documentation

## 2019-07-15 DIAGNOSIS — I429 Cardiomyopathy, unspecified: Secondary | ICD-10-CM | POA: Insufficient documentation

## 2019-07-15 DIAGNOSIS — I132 Hypertensive heart and chronic kidney disease with heart failure and with stage 5 chronic kidney disease, or end stage renal disease: Secondary | ICD-10-CM | POA: Diagnosis not present

## 2019-07-15 DIAGNOSIS — Y841 Kidney dialysis as the cause of abnormal reaction of the patient, or of later complication, without mention of misadventure at the time of the procedure: Secondary | ICD-10-CM | POA: Insufficient documentation

## 2019-07-15 DIAGNOSIS — Z7982 Long term (current) use of aspirin: Secondary | ICD-10-CM | POA: Insufficient documentation

## 2019-07-15 DIAGNOSIS — T82898A Other specified complication of vascular prosthetic devices, implants and grafts, initial encounter: Secondary | ICD-10-CM

## 2019-07-15 HISTORY — PX: A/V FISTULAGRAM: CATH118298

## 2019-07-15 HISTORY — PX: UPPER EXTREMITY VENOGRAPHY: CATH118272

## 2019-07-15 LAB — POCT I-STAT, CHEM 8
BUN: 47 mg/dL — ABNORMAL HIGH (ref 8–23)
Calcium, Ion: 1.01 mmol/L — ABNORMAL LOW (ref 1.15–1.40)
Chloride: 103 mmol/L (ref 98–111)
Creatinine, Ser: 8.9 mg/dL — ABNORMAL HIGH (ref 0.61–1.24)
Glucose, Bld: 71 mg/dL (ref 70–99)
HCT: 40 % (ref 39.0–52.0)
Hemoglobin: 13.6 g/dL (ref 13.0–17.0)
Potassium: 4.4 mmol/L (ref 3.5–5.1)
Sodium: 134 mmol/L — ABNORMAL LOW (ref 135–145)
TCO2: 25 mmol/L (ref 22–32)

## 2019-07-15 SURGERY — A/V FISTULAGRAM
Anesthesia: LOCAL

## 2019-07-15 MED ORDER — SODIUM CHLORIDE 0.9 % IV SOLN: 250.0000 mL | INTRAVENOUS | Status: DC | PRN

## 2019-07-15 MED ORDER — SODIUM CHLORIDE 0.9% FLUSH: 3.0000 mL | Freq: Two times a day (BID) | INTRAVENOUS | Status: DC

## 2019-07-15 MED ORDER — LIDOCAINE HCL (PF) 1 % IJ SOLN
INTRAMUSCULAR | Status: AC
  Filled 2019-07-15: qty 30

## 2019-07-15 MED ORDER — LIDOCAINE HCL (PF) 1 % IJ SOLN
INTRAMUSCULAR | Status: DC | PRN
  Administered 2019-07-15: 5 mL

## 2019-07-15 MED ORDER — IODIXANOL 320 MG/ML IV SOLN
INTRAVENOUS | Status: DC | PRN
  Administered 2019-07-15: 30 mL

## 2019-07-15 MED ORDER — SODIUM CHLORIDE 0.9% FLUSH: 3.0000 mL | INTRAVENOUS | Status: DC | PRN

## 2019-07-15 MED ORDER — HEPARIN (PORCINE) IN NACL 1000-0.9 UT/500ML-% IV SOLN
INTRAVENOUS | Status: AC
  Filled 2019-07-15: qty 500

## 2019-07-15 SURGICAL SUPPLY — 8 items
COVER DOME SNAP 22 D (MISCELLANEOUS) ×3 IMPLANT
KIT MICROPUNCTURE NIT STIFF (SHEATH) ×1 IMPLANT
PROTECTION STATION PRESSURIZED (MISCELLANEOUS) ×3
SHEATH PROBE COVER 6X72 (BAG) ×1 IMPLANT
STATION PROTECTION PRESSURIZED (MISCELLANEOUS) ×2 IMPLANT
STOPCOCK MORSE 400PSI 3WAY (MISCELLANEOUS) ×3 IMPLANT
TRAY PV CATH (CUSTOM PROCEDURE TRAY) ×3 IMPLANT
TUBING CIL FLEX 10 FLL-RA (TUBING) ×3 IMPLANT

## 2019-07-15 NOTE — Interval H&P Note (Signed)
History and Physical Interval Note:  07/15/2019 10:25 AM  Timothy Faulkner  has presented today for surgery, with the diagnosis of mechical breakdown of fistula.  The various methods of treatment have been discussed with the patient and family. After consideration of risks, benefits and other options for treatment, the patient has consented to  Procedure(s): A/V FISTULAGRAM - Left Arm (N/A) as a surgical intervention.  The patient's history has been reviewed, patient examined, no change in status, stable for surgery.  I have reviewed the patient's chart and labs.  Questions were answered to the patient's satisfaction.     Ruta Hinds

## 2019-07-15 NOTE — Op Note (Signed)
Procedure: Left arm fistulogram, right central venogram  Preoperative diagnosis: Poorly functioning left arm fistula, end-stage renal disease  Postoperative diagnosis: Same  Anesthesia: Local  Operative findings: 1.  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  Patent right central veins  Operative details: After pain informed consent, the patient taken the PV lab.  The patient's placed supine position on the angio table.  Patient's left upper extremities prepped and draped in usual sterile fashion.  Local anesthesia was infiltrated over the proximal aspect of the fistula.  Ultrasound was used to identify the fistula.  It was patent.  Image was obtained for the patient's record.  Micropuncture needle was then used to cannulate the fistula after administering local anesthesia.  Micropuncture wire was advanced into the fistula micropuncture sheath placed over this.  Contrast angiogram was then obtained through the micropuncture sheath.  This shows a widely patent left upper arm AV fistula.  The axillary vein is patent.  The subclavian vein is patent from the clavicle almost to the level of the sternum where it occludes.  There are large amount of collaterals around this.  Superior vena cava does eventually fill through collaterals.  At this point the micropuncture sheath was removed from the left arm and the hole closed with a single Monocryl figure-of-eight stitch.  Attention was then turned to the right arm.  Patient had previously had a peripheral IV started in the preop holding area.  This was accessed and a contrast angiogram was performed to examine the central veins on the right side.  There are multiple stents and what appears to be the cephalic vein that are all occluded in the right arm.  However the axillary vein is patent as well as the innominate and subclavian vein on the right side.  At this point the procedure was concluded.  Operative management: Patient is not  a candidate for further access procedures on the left side as he has occlusion of the left subclavian vein.  He does have a pacemaker and a pre-existing dialysis catheter on the left side.  If these were all removed at some point in the future he might would be a candidate for a left arm access.  Otherwise he is a right arm access candidate only.  We will schedule him in the near future on a nondialysis day for a right upper arm AV graft.  Ruta Hinds, MD [facility] [place] Office: [phone]

## 2019-07-15 NOTE — Discharge Instructions (Signed)

## 2019-07-18 MED FILL — Heparin Sod (Porcine)-NaCl IV Soln 1000 Unit/500ML-0.9%: INTRAVENOUS | Qty: 500 | Status: AC

## 2019-07-19 ENCOUNTER — Other Ambulatory Visit: Payer: Self-pay

## 2019-07-25 ENCOUNTER — Ambulatory Visit (INDEPENDENT_AMBULATORY_CARE_PROVIDER_SITE_OTHER): Payer: Medicare Other | Admitting: *Deleted

## 2019-07-25 DIAGNOSIS — Z95810 Presence of automatic (implantable) cardiac defibrillator: Secondary | ICD-10-CM

## 2019-07-25 LAB — CUP PACEART REMOTE DEVICE CHECK
Battery Remaining Longevity: 54 mo
Battery Remaining Percentage: 56 %
Brady Statistic RV Percent Paced: 0 %
Date Time Interrogation Session: 20201214013100
HighPow Impedance: 113 Ohm
Implantable Lead Implant Date: 20111118
Implantable Lead Location: 753860
Implantable Lead Model: 181
Implantable Lead Serial Number: 313758
Implantable Pulse Generator Implant Date: 20111118
Lead Channel Impedance Value: 593 Ohm
Lead Channel Pacing Threshold Amplitude: 0.7 V
Lead Channel Pacing Threshold Pulse Width: 0.5 ms
Lead Channel Setting Pacing Amplitude: 2.4 V
Lead Channel Setting Pacing Pulse Width: 0.5 ms
Lead Channel Setting Sensing Sensitivity: 0.5 mV
Pulse Gen Serial Number: 268731

## 2019-08-06 ENCOUNTER — Other Ambulatory Visit (HOSPITAL_COMMUNITY)
Admission: RE | Admit: 2019-08-06 | Discharge: 2019-08-06 | Disposition: A | Discharge status: Home / Self Care | Payer: Medicare Other | Source: Ambulatory Visit | Attending: Vascular Surgery | Admitting: Vascular Surgery

## 2019-08-06 DIAGNOSIS — Z20828 Contact with and (suspected) exposure to other viral communicable diseases: Secondary | ICD-10-CM | POA: Diagnosis not present

## 2019-08-06 DIAGNOSIS — Z01812 Encounter for preprocedural laboratory examination: Secondary | ICD-10-CM | POA: Diagnosis present

## 2019-08-06 LAB — SARS CORONAVIRUS 2 (TAT 6-24 HRS): SARS Coronavirus 2: NEGATIVE

## 2019-08-08 ENCOUNTER — Encounter (HOSPITAL_COMMUNITY): Payer: Self-pay | Admitting: Vascular Surgery

## 2019-08-08 NOTE — Progress Notes (Signed)
Anesthesia Chart Review: Same day workup  Follows with 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 When he was seen 03/04/19, [name] stated the patient was high risk for any surgical procedures due to depressed LV function and valvular disease. Most recent echo 04/17/19 shows pt's EF improved from <20% in 2018 to 25-30% now.   ICD is followed by Dr. Lovena Le. Last remote 04/25/19 showed normal device function, 5 NST, longest 8 beats, 190's. Device form faxed to clinic.   Will need DOS labs and eval.  EKG 04/16/19: Sinus rhythm with Premature atrial complexes with Abberant conduction. Rate 82. Left axis deviation. Inferior infarct , age undetermined  TTE 04/17/19:  1. The left ventricle has severely reduced systolic function, with an ejection fraction of 25-30%. The cavity size was mildly dilated. Left ventricular diastolic Doppler parameters are consistent with impaired relaxation. Left ventricular diffuse  hypokinesis.  2. The right ventricle has normal systolic function. The cavity was normal. There is no increase in right ventricular wall thickness.  3. Left atrial size was mildly dilated.  4. The aortic valve is tricuspid. Mild thickening of the aortic valve. Mild calcification of the aortic valve. No stenosis of the aortic valve.  5. The aorta is normal unless otherwise noted.  6. The interatrial septum was not assessed.   Wynonia Musty [facility] Short Stay Center/Anesthesiology Phone [phone] 08/08/2019 10:07 AM

## 2019-08-08 NOTE — Anesthesia Preprocedure Evaluation (Addendum)
Anesthesia Evaluation  Patient identified by MRN, date of birth, ID band Patient awake    Reviewed: Allergy & Precautions, NPO status , Patient's Chart, lab work & pertinent test results  Airway Mallampati: II  TM Distance: >3 FB Neck ROM: Full    Dental no notable dental hx. (+) Poor Dentition   Pulmonary shortness of breath, sleep apnea , former smoker,    Pulmonary exam normal breath sounds clear to auscultation       Cardiovascular hypertension, Pt. on medications + Peripheral Vascular Disease and +CHF  Normal cardiovascular exam+ pacemaker + Cardiac Defibrillator + Valvular Problems/Murmurs  Rhythm:Regular Rate:Normal     Neuro/Psych  Headaches, PSYCHIATRIC DISORDERS Anxiety  Neuromuscular disease    GI/Hepatic Neg liver ROS, GERD  Medicated,  Endo/Other  negative endocrine ROSdiabetes  Renal/GU ESRF and DialysisRenal disease  negative genitourinary   Musculoskeletal  (+) Arthritis ,   Abdominal   Peds negative pediatric ROS (+)  Hematology  (+) Blood dyscrasia, anemia ,   Anesthesia Other Findings   Reproductive/Obstetrics negative OB ROS                            Anesthesia Physical Anesthesia Plan  ASA: IV  Anesthesia Plan: MAC   Post-op Pain Management:    Induction: Intravenous  PONV Risk Score and Plan:   Airway Management Planned: Mask, Natural Airway and Nasal Cannula  Additional Equipment:   Intra-op Plan:   Post-operative Plan:   Informed Consent:   Plan Discussed with: Anesthesiologist and CRNA  Anesthesia Plan Comments: (Follows with 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 When he was seen 03/04/19, [name] stated the patient was high risk for any surgical procedures due to depressed LV function and valvular disease. Most recent echo 04/17/19 shows pt's EF improved from <20% in 2018 to 25-30% now.   ICD is followed by Dr. Lovena Le. Last remote  04/25/19 showed normal device function, 5 NST, longest 8 beats, 190's. Device form faxed to clinic.   Will need DOS labs and eval.  EKG 04/16/19: Sinus rhythm with Premature atrial complexes with Abberant conduction. Rate 82. Left axis deviation. Inferior infarct , age undetermined  TTE 04/17/19:  1. The left ventricle has severely reduced systolic function, with an ejection fraction of 25-30%. The cavity size was mildly dilated. Left ventricular diastolic Doppler parameters are consistent with impaired relaxation. Left ventricular diffuse  hypokinesis.  2. The right ventricle has normal systolic function. The cavity was normal. There is no increase in right ventricular wall thickness.  3. Left atrial size was mildly dilated.  4. The aortic valve is tricuspid. Mild thickening of the aortic valve. Mild calcification of the aortic valve. No stenosis of the aortic valve.  5. The aorta is normal unless otherwise noted.  6. The interatrial septum was not assessed.)      Anesthesia Quick Evaluation

## 2019-08-08 NOTE — Progress Notes (Signed)
Unable to reach patient by phone after multiple attempts through out the day. LMOM with instructions for 12/29 DOS.    Chest x-ray - 04/16/2019 EKG - 04/14/2019 ECHO - 04/16/2019 Cardiac Cath - None  ICD Pacemaker/Loop- Faxed sent to [facility] on 08/06/19 by Melvenia Beam, RN. [employer] model E102 Teligan 100. Last remote check on 07/25/19. BS Rep Joey notified.  Probably will use a magnet per Joey but please call him if needed at [phone].  Blood Thinner Instructions:  Follow your surgeon's instructions on when to stop Plavix prior to surgery.    Aspirin Instructions: Follow your surgeon's instructions on when to stop aspirin prior to surgery,  If no instructions were given by your surgeon then you will need to call the office for those instructions.  Anesthesia review: Yes  STOP now taking any Aspirin (unless otherwise instructed by your surgeon), Aleve, Naproxen, Ibuprofen, Motrin, Advil, Goody's, BC's, all herbal medications, fish oil, and all vitamins.   Coronavirus Screening Covid test on 08/06/19 was negative.

## 2019-08-09 ENCOUNTER — Ambulatory Visit (HOSPITAL_COMMUNITY): Payer: Medicare Other | Admitting: Physician Assistant

## 2019-08-09 ENCOUNTER — Encounter (HOSPITAL_COMMUNITY)
Admission: RE | Disposition: A | Discharge status: Home / Self Care | Payer: Self-pay | Source: Ambulatory Visit | Attending: Vascular Surgery

## 2019-08-09 ENCOUNTER — Encounter (HOSPITAL_COMMUNITY): Payer: Self-pay | Admitting: Vascular Surgery

## 2019-08-09 ENCOUNTER — Other Ambulatory Visit: Payer: Self-pay

## 2019-08-09 ENCOUNTER — Ambulatory Visit (HOSPITAL_COMMUNITY)
Admission: RE | Admit: 2019-08-09 | Discharge: 2019-08-09 | Disposition: A | Discharge status: Home / Self Care | Payer: Medicare Other | Source: Ambulatory Visit | Attending: Vascular Surgery | Admitting: Vascular Surgery

## 2019-08-09 DIAGNOSIS — Z992 Dependence on renal dialysis: Secondary | ICD-10-CM | POA: Diagnosis not present

## 2019-08-09 DIAGNOSIS — Z7902 Long term (current) use of antithrombotics/antiplatelets: Secondary | ICD-10-CM | POA: Diagnosis not present

## 2019-08-09 DIAGNOSIS — E1151 Type 2 diabetes mellitus with diabetic peripheral angiopathy without gangrene: Secondary | ICD-10-CM | POA: Insufficient documentation

## 2019-08-09 DIAGNOSIS — Z95810 Presence of automatic (implantable) cardiac defibrillator: Secondary | ICD-10-CM | POA: Insufficient documentation

## 2019-08-09 DIAGNOSIS — Z79899 Other long term (current) drug therapy: Secondary | ICD-10-CM | POA: Diagnosis not present

## 2019-08-09 DIAGNOSIS — E1122 Type 2 diabetes mellitus with diabetic chronic kidney disease: Secondary | ICD-10-CM | POA: Insufficient documentation

## 2019-08-09 DIAGNOSIS — K219 Gastro-esophageal reflux disease without esophagitis: Secondary | ICD-10-CM | POA: Diagnosis not present

## 2019-08-09 DIAGNOSIS — Z89512 Acquired absence of left leg below knee: Secondary | ICD-10-CM | POA: Insufficient documentation

## 2019-08-09 DIAGNOSIS — M1990 Unspecified osteoarthritis, unspecified site: Secondary | ICD-10-CM | POA: Diagnosis not present

## 2019-08-09 DIAGNOSIS — M109 Gout, unspecified: Secondary | ICD-10-CM | POA: Diagnosis not present

## 2019-08-09 DIAGNOSIS — E7800 Pure hypercholesterolemia, unspecified: Secondary | ICD-10-CM | POA: Diagnosis not present

## 2019-08-09 DIAGNOSIS — I132 Hypertensive heart and chronic kidney disease with heart failure and with stage 5 chronic kidney disease, or end stage renal disease: Secondary | ICD-10-CM | POA: Diagnosis present

## 2019-08-09 DIAGNOSIS — Z7982 Long term (current) use of aspirin: Secondary | ICD-10-CM | POA: Insufficient documentation

## 2019-08-09 DIAGNOSIS — N186 End stage renal disease: Secondary | ICD-10-CM | POA: Diagnosis not present

## 2019-08-09 HISTORY — PX: AV FISTULA PLACEMENT: SHX1204

## 2019-08-09 LAB — POCT I-STAT, CHEM 8
BUN: 26 mg/dL — ABNORMAL HIGH (ref 8–23)
Calcium, Ion: 0.97 mmol/L — ABNORMAL LOW (ref 1.15–1.40)
Chloride: 102 mmol/L (ref 98–111)
Creatinine, Ser: 4.9 mg/dL — ABNORMAL HIGH (ref 0.61–1.24)
Glucose, Bld: 67 mg/dL — ABNORMAL LOW (ref 70–99)
HCT: 43 % (ref 39.0–52.0)
Hemoglobin: 14.6 g/dL (ref 13.0–17.0)
Potassium: 4.8 mmol/L (ref 3.5–5.1)
Sodium: 135 mmol/L (ref 135–145)
TCO2: 25 mmol/L (ref 22–32)

## 2019-08-09 LAB — GLUCOSE, CAPILLARY
Glucose-Capillary: 69 mg/dL — ABNORMAL LOW (ref 70–99)
Glucose-Capillary: 82 mg/dL (ref 70–99)
Glucose-Capillary: 73 mg/dL (ref 70–99)

## 2019-08-09 SURGERY — INSERTION OF ARTERIOVENOUS (AV) GORE-TEX GRAFT ARM
Anesthesia: Monitor Anesthesia Care | Laterality: Right

## 2019-08-09 MED ORDER — HYDROCODONE-ACETAMINOPHEN 5-325 MG PO TABS: 1.0000 | ORAL_TABLET | Freq: Four times a day (QID) | ORAL | 0 refills | Status: AC | PRN

## 2019-08-09 MED ORDER — LIDOCAINE 2% (20 MG/ML) 5 ML SYRINGE
INTRAMUSCULAR | Status: DC | PRN
  Administered 2019-08-09: 40 mg via INTRAVENOUS

## 2019-08-09 MED ORDER — DEXMEDETOMIDINE HCL 200 MCG/2ML IV SOLN
INTRAVENOUS | Status: DC | PRN
  Administered 2019-08-09 (×2): 4 ug via INTRAVENOUS
  Administered 2019-08-09: 8 ug via INTRAVENOUS
  Administered 2019-08-09: 4 ug via INTRAVENOUS

## 2019-08-09 MED ORDER — LIDOCAINE HCL (PF) 1 % IJ SOLN
INTRAMUSCULAR | Status: DC | PRN
  Administered 2019-08-09 (×3): 30 mL

## 2019-08-09 MED ORDER — SODIUM CHLORIDE 0.9 % IV SOLN: INTRAVENOUS | Status: DC

## 2019-08-09 MED ORDER — MIDAZOLAM HCL 2 MG/2ML IJ SOLN
INTRAMUSCULAR | Status: AC
  Filled 2019-08-09: qty 2

## 2019-08-09 MED ORDER — DEXTROSE 50 % IV SOLN
12.5000 g | Freq: Once | INTRAVENOUS | Status: AC
  Administered 2019-08-09: 08:00:00 10 mL via INTRAVENOUS
  Filled 2019-08-09: qty 50

## 2019-08-09 MED ORDER — PROTAMINE SULFATE 10 MG/ML IV SOLN
INTRAVENOUS | Status: DC | PRN
  Administered 2019-08-09: 10 mg via INTRAVENOUS
  Administered 2019-08-09: 20 mg via INTRAVENOUS

## 2019-08-09 MED ORDER — CHLORHEXIDINE GLUCONATE 4 % EX LIQD: 60.0000 mL | Freq: Once | CUTANEOUS | Status: DC

## 2019-08-09 MED ORDER — HEMOSTATIC AGENTS (NO CHARGE) OPTIME
TOPICAL | Status: DC | PRN
  Administered 2019-08-09: 1 via TOPICAL

## 2019-08-09 MED ORDER — SODIUM CHLORIDE 0.9 % IV SOLN
INTRAVENOUS | Status: AC
  Filled 2019-08-09: qty 1.2

## 2019-08-09 MED ORDER — LIDOCAINE HCL (PF) 1 % IJ SOLN
INTRAMUSCULAR | Status: AC
  Filled 2019-08-09: qty 30

## 2019-08-09 MED ORDER — MIDAZOLAM HCL 5 MG/5ML IJ SOLN
INTRAMUSCULAR | Status: DC | PRN
  Administered 2019-08-09: 1 mg via INTRAVENOUS

## 2019-08-09 MED ORDER — CEFAZOLIN SODIUM-DEXTROSE 2-4 GM/100ML-% IV SOLN
INTRAVENOUS | Status: AC
  Filled 2019-08-09: qty 100

## 2019-08-09 MED ORDER — CEFAZOLIN SODIUM-DEXTROSE 2-4 GM/100ML-% IV SOLN
2.0000 g | INTRAVENOUS | Status: AC
  Administered 2019-08-09: 2 g via INTRAVENOUS

## 2019-08-09 MED ORDER — FENTANYL CITRATE (PF) 250 MCG/5ML IJ SOLN
INTRAMUSCULAR | Status: AC
  Filled 2019-08-09: qty 5

## 2019-08-09 MED ORDER — SODIUM CHLORIDE 0.9 % IV SOLN
INTRAVENOUS | Status: DC | PRN
  Administered 2019-08-09: 500 mL

## 2019-08-09 MED ORDER — 0.9 % SODIUM CHLORIDE (POUR BTL) OPTIME
TOPICAL | Status: DC | PRN
  Administered 2019-08-09: 1000 mL

## 2019-08-09 MED ORDER — PHENYLEPHRINE HCL-NACL 10-0.9 MG/250ML-% IV SOLN
INTRAVENOUS | Status: DC | PRN
  Administered 2019-08-09: 44 ug/min via INTRAVENOUS

## 2019-08-09 MED ORDER — HEPARIN SODIUM (PORCINE) 1000 UNIT/ML IJ SOLN
INTRAMUSCULAR | Status: DC | PRN
  Administered 2019-08-09: 5000 [IU] via INTRAVENOUS

## 2019-08-09 MED ORDER — PHENYLEPHRINE 40 MCG/ML (10ML) SYRINGE FOR IV PUSH (FOR BLOOD PRESSURE SUPPORT)
PREFILLED_SYRINGE | INTRAVENOUS | Status: DC | PRN
  Administered 2019-08-09: 80 ug via INTRAVENOUS

## 2019-08-09 MED ORDER — PROPOFOL 10 MG/ML IV BOLUS
INTRAVENOUS | Status: AC
  Filled 2019-08-09: qty 20

## 2019-08-09 MED ORDER — FENTANYL CITRATE (PF) 250 MCG/5ML IJ SOLN
INTRAMUSCULAR | Status: DC | PRN
  Administered 2019-08-09 (×2): 50 ug via INTRAVENOUS

## 2019-08-09 MED ORDER — LIDOCAINE HCL (PF) 1 % IJ SOLN
INTRAMUSCULAR | Status: AC
  Filled 2019-08-09: qty 60

## 2019-08-09 MED ORDER — PROPOFOL 500 MG/50ML IV EMUL
INTRAVENOUS | Status: DC | PRN
  Administered 2019-08-09: 15 ug/kg/min via INTRAVENOUS

## 2019-08-09 SURGICAL SUPPLY — 36 items
ADH SKN CLS APL DERMABOND .7 (GAUZE/BANDAGES/DRESSINGS) ×1
AGENT HMST SPONGE THK3/8 (HEMOSTASIS) ×1
ARMBAND PINK RESTRICT EXTREMIT (MISCELLANEOUS) ×3 IMPLANT
CANISTER SUCT 3000ML PPV (MISCELLANEOUS) ×2 IMPLANT
CANNULA VESSEL 3MM 2 BLNT TIP (CANNULA) ×2 IMPLANT
CLIP VESOCCLUDE MED 6/CT (CLIP) ×2 IMPLANT
CLIP VESOCCLUDE SM WIDE 6/CT (CLIP) ×2 IMPLANT
DECANTER SPIKE VIAL GLASS SM (MISCELLANEOUS) ×2 IMPLANT
DERMABOND ADVANCED (GAUZE/BANDAGES/DRESSINGS) ×1
DERMABOND ADVANCED .7 DNX12 (GAUZE/BANDAGES/DRESSINGS) ×1 IMPLANT
ELECT REM PT RETURN 9FT ADLT (ELECTROSURGICAL) ×2
ELECTRODE REM PT RTRN 9FT ADLT (ELECTROSURGICAL) ×1 IMPLANT
GAUZE 4X4 16PLY RFD (DISPOSABLE) ×1 IMPLANT
GLOVE BIO SURGEON STRL SZ7.5 (GLOVE) ×3 IMPLANT
GLOVE BIOGEL PI IND STRL 6.5 (GLOVE) IMPLANT
GLOVE BIOGEL PI INDICATOR 6.5 (GLOVE) ×5
GLOVE SURG SS PI 6.5 STRL IVOR (GLOVE) ×1 IMPLANT
GOWN STRL REUS W/ TWL LRG LVL3 (GOWN DISPOSABLE) ×3 IMPLANT
GOWN STRL REUS W/TWL LRG LVL3 (GOWN DISPOSABLE) ×12
GRAFT GORETEX STRT 4-7X45 (Vascular Products) ×1 IMPLANT
HEMOSTAT SPONGE AVITENE ULTRA (HEMOSTASIS) ×1 IMPLANT
KIT BASIN OR (CUSTOM PROCEDURE TRAY) ×2 IMPLANT
KIT TURNOVER KIT B (KITS) ×2 IMPLANT
NS IRRIG 1000ML POUR BTL (IV SOLUTION) ×2 IMPLANT
PACK CV ACCESS (CUSTOM PROCEDURE TRAY) ×2 IMPLANT
PAD ARMBOARD 7.5X6 YLW CONV (MISCELLANEOUS) ×4 IMPLANT
SPONGE LAP 18X18 RF (DISPOSABLE) ×1 IMPLANT
SUT PROLENE 6 0 CC (SUTURE) ×8 IMPLANT
SUT VIC AB 3-0 SH 27 (SUTURE) ×6
SUT VIC AB 3-0 SH 27X BRD (SUTURE) ×2 IMPLANT
SUT VIC AB 4-0 PS2 18 (SUTURE) ×1 IMPLANT
SUT VICRYL 4-0 PS2 18IN ABS (SUTURE) ×4 IMPLANT
SYR TOOMEY 50ML (SYRINGE) IMPLANT
TOWEL GREEN STERILE (TOWEL DISPOSABLE) ×2 IMPLANT
UNDERPAD 30X30 (UNDERPADS AND DIAPERS) ×2 IMPLANT
WATER STERILE IRR 1000ML POUR (IV SOLUTION) ×2 IMPLANT

## 2019-08-09 NOTE — Progress Notes (Signed)
Dr. Ambrose Pancoast obtained IV.  Dr. Ambrose Pancoast said patient does not need to receive Dextrose for BG 69 since patient is asymptomatic.

## 2019-08-09 NOTE — Transfer of Care (Signed)
Immediate Anesthesia Transfer of Care Note  Patient: Timothy Faulkner  Procedure(s) Performed: INSERTION OF RIGHT ARTERIOVENOUS (AV) 4-58m GORE-TEX STRETCH GRAFT RIGHT  ARM (Right )  Patient Location: PACU  Anesthesia Type:MAC  Level of Consciousness: awake, alert  and oriented  Airway & Oxygen Therapy: Patient Spontanous Breathing and Patient connected to face mask oxygen  Post-op Assessment: Report given to RN, Post -op Vital signs reviewed and stable and Patient moving all extremities X 4  Post vital signs: Reviewed and stable  Last Vitals:  Vitals Value Taken Time  BP 95/68 08/09/19 1025  Temp    Pulse 78 08/09/19 1028  Resp 21 08/09/19 1028  SpO2 92 % 08/09/19 1028  Vitals shown include unvalidated device data.  Last Pain:  Vitals:   08/09/19 0624  TempSrc: Oral  PainSc:          Complications: No apparent anesthesia complications

## 2019-08-09 NOTE — Op Note (Signed)
Procedure: Left Upper Arm AV graft  Preop: ESRD  Postop: ESRD  Anesthesia: Local with sedation  Findings:4-7 mm PTFE end to side to axillary vein  Asst: Matt Eveland PA-C   Procedure Details: After adequate sedation, the left upper extremity was prepped and draped in usual sterile fashion. Local anesthesia was infiltrated in the antecubital crease and axilla.   A longitudinal incision was then made near the antecubital crease the left arm.  There were no suitable antecubital veins for outflow.   The incision was carried into the subcutaneous tissues down to level of the brachial artery.   Next the brachial artery was dissected free in the medial portion incision. The artery was 4 mm in diameter.  It was slightly thickened but of overall good quality the vessel loops were placed proximal and distal to the planned site of arteriotomy. At this point, a longitudinal incision was made in the axilla and carried through the subcutaneous tissues and fascia to expose the axillary vein.  The nerves were protected.  The vein was approximately 4 mm in diameter.  It was a paired system of veins.  The adjacent more superficial vein was slightly smaller about 3 mm diameter.  Next, a subcutaneous tunnel was created connecting the upper arm to the lower arm incision in an arcing configuration over the biceps muscle.  A 4-7 mm PTFE graft was then brought through this subcutaneous tunnel. The patient was given 5000 units of intravenous heparin. After appropriate circulation time, the vessel loops were used to control the artery. A longitudinal opening was made in the right brachial artery.  The 4 mm end of the graft was beveled and sewn end to side to the artery using a 6 0 prolene.  At completion of the anastomosis the artery was forward bled, backbled and thoroughly flushed.  The anastomosis was secured, vessel loops were released and there was palpable pulse in the graft.  The graft was clamped just above the arterial  anastomosis with a fistula clamp. The graft was then pulled taut to length at the axillary incision.  The axillary vein was controlled with a fine bulldog clamp in the upper axilla proximally and distally.  The vein was opened longitudinally.  The distal end of the graft was then beveled and sewn end to side to the vein using a running 6 0 prolene.  Just prior to completion of the anastomosis, everything was forward bled, back bled and thoroughly flushed.  The anastomosis was secured and the fistula clamp removed from the proximal graft.  A thrill was immediately palpable in the graft.  The patient was given 50 mg of protamine.  Avitene was applied.  After hemostasis was obtained, the subcutaneous tissues were reapproximated using a running 3-0 Vicryl suture. The skin was then closed with a 4 0 Vicryl subcuticular stitch. Dermabond was applied to the skin incisions.  The patient tolerated the procedure well and there were no complications.  Instrument sponge and needle count was correct at the end of the case.  The patient was taken to the recovery room in stable condition.   Ruta Hinds, MD [facility] Office: [phone] Pager: [phone]

## 2019-08-09 NOTE — Anesthesia Procedure Notes (Signed)
Procedure Name: MAC Date/Time: 08/09/2019 7:40 AM Performed by: Mariea Clonts, CRNA Pre-anesthesia Checklist: Patient identified, Emergency Drugs available, Suction available, Patient being monitored and Timeout performed Patient Re-evaluated:Patient Re-evaluated prior to induction Oxygen Delivery Method: Nasal cannula and Simple face mask

## 2019-08-09 NOTE — H&P (Signed)
Patient is a 73 year old male who returns for follow-up today. 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 He was also sent for evaluation of his left upper arm AV fistula. This was placed in September 2019. 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 Unfortunately these images are not available for review. The patient states he has never had any swelling in his left upper arm. He has had a prior left forearm fistula as well. Consideration was being given for placing an AV graft in his right arm but we were reluctant proved proceed with that while the patient had an open ongoing wound in his right foot due to risk of infection of an AV graft. Currently the patient is dialyzing via left side dialysis catheter. He also has a left-sided AICD. He did have a central venogram by Dr. Scot Dock several months ago which shows the right central venous system was patent. Patient also states he recently had some trauma to his left below-knee amputation and wished to have this checked today. He has no incisional drainage.  Review of systems: He has shortness of breath with exertion. He has no chest pain.      Past Medical History:  Diagnosis Date  . Anemia   . Arthritis    Gout  . Automatic implantable cardioverter-defibrillator in situ    [employer]  . CHF   . ESRD on dialysis [facility])    [name] 03/11/2013 (03/11/2013) dialysis M/W/F  . Gangrene ([place])    left foot  . GERD (gastroesophageal reflux disease)   . Gout    "once a year"  . Heart murmur   . HYPERCHOLESTEROLEMIA, MIXED   . HYPERTENSION   . Macular degeneration   . Osteomyelitis ([place])    right foot  . 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 Pacemaker   . Peripheral arterial disease (HCC)    left fifth toe ulcer, healing  . Pneumonia   . Shortness of breath   . 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 Wears  dentures   . Wears glasses         Past Surgical History:  Procedure Laterality Date  . A/V FISTULAGRAM Left 07/20/2018   Procedure: A/V FISTULAGRAM; Surgeon: [name], MD; Location: [place] [facility]; Service: Cardiovascular; Laterality: Left;  . ABDOMINAL AORTOGRAM N/A 02/25/2019   Procedure: ABDOMINAL AORTOGRAM; Surgeon: Elam Dutch, MD; Location: [place] [facility]; Service: Cardiovascular; Laterality: N/A;  . ABDOMINAL AORTOGRAM W/LOWER EXTREMITY Bilateral 07/20/2018   Procedure: ABDOMINAL AORTOGRAM W/LOWER EXTREMITY; Surgeon: [name], MD; Location: [place] [facility]; Service: Cardiovascular; Laterality: Bilateral;  . AMPUTATION Right 09/07/2018   Procedure: Right fifth metatarsectomy; Surgeon: [name], DPM; Location: [place]; Service: Podiatry; Laterality: Right;  . AMPUTATION Left 03/08/2019   Procedure: AMPUTATION 3RD AND 4TH TOES LEFT FOOT; Surgeon: [name], DPM; Location: [place]; Service: Podiatry; Laterality: Left;  . AMPUTATION Left 03/15/2019   Procedure: AMPUTATION BELOW KNEE; Surgeon: Elam Dutch, MD; Location: [facility] OR; Service: Vascular; Laterality: Left;  . AV FISTULA PLACEMENT Right 12/13/2012   Procedure: ARTERIOVENOUS (AV) FISTULA CREATION; Surgeon: [name], MD; Location: [place]; Service: Vascular; Laterality: Right; Ultrasound guided  . AV FISTULA PLACEMENT Left 05/07/2016   Procedure: LEFT RADIOCEPHALIC ARTERIOVENOUS (AV) FISTULA CREATION; Surgeon: [name], MD; Location: [place]; Service: Vascular; Laterality: Left;  .  BASCILIC VEIN TRANSPOSITION Right 03/26/2016   Procedure: RIGHT BASILIC VEIN TRANSPOSITION; Surgeon: [name], MD; Location: [place]; Service: Vascular; Laterality: Right;  . CARDIAC CATHETERIZATION    . CARDIAC DEFIBRILLATOR PLACEMENT     Chemical engineer  . EYE SURGERY Bilateral    Cataract  . FISTULOGRAM Left 04/22/2018   Procedure: FISTULOGRAM UPPER EXTREMITY; Surgeon: [name], MD;  Location: [place]; Service: Vascular; Laterality: Left;  . I&D EXTREMITY Right 07/15/2018   Procedure: IRRIGATION AND DEBRIDEMENT RIGHT FOOT; Surgeon: [name], DPM; Location: [place]; Service: Podiatry; Laterality: Right;  . INCISION AND DRAINAGE ABSCESS / HEMATOMA OF BURSA / KNEE / THIGH Left 2012   "knee" (03/11/2013)  . INSERT / REPLACE / REMOVE PACEMAKER    . INSERTION OF DIALYSIS CATHETER Left 04/22/2018   Procedure: INSERTION OF DIALYSIS CATHETER; Surgeon: [name], MD; Location: [place]; Service: Vascular; Laterality: Left;  . IR FLUORO GUIDE CV LINE LEFT  07/15/2018  . IR PTA VENOUS EXCEPT DIALYSIS CIRCUIT  07/15/2018  . LOWER EXTREMITY ANGIOGRAPHY Right 07/21/2018   Procedure: LOWER EXTREMITY ANGIOGRAPHY; Surgeon: [name], MD; Location: [place] [facility]; Service: Cardiovascular; Laterality: Right;  . LOWER EXTREMITY ANGIOGRAPHY Bilateral 02/25/2019   Procedure: Lower Extremity Angiography; Surgeon: Elam Dutch, MD; Location: [place] [facility]; Service: Cardiovascular; Laterality: Bilateral;  . METATARSAL HEAD EXCISION Right 07/15/2018   Procedure: METATARSAL RESECTION; Surgeon: [name], DPM; Location: [place]; Service: Podiatry; Laterality: Right;  . MULTIPLE TOOTH EXTRACTIONS    . PERIPHERAL VASCULAR ATHERECTOMY Right 02/28/2019   Procedure: PERIPHERAL VASCULAR ATHERECTOMY; Surgeon: Waynetta Sandy, MD; Location: [place] [facility]; Service: Cardiovascular; Laterality: Right; right tp trunk   . PERIPHERAL VASCULAR BALLOON ANGIOPLASTY Left 02/25/2019   Procedure: PERIPHERAL VASCULAR BALLOON ANGIOPLASTY; Surgeon: Elam Dutch, MD; Location: [place] [facility]; Service: Cardiovascular; Laterality: Left; tibial peroneal trunk and PT  . PERIPHERAL VASCULAR INTERVENTION Right 07/21/2018   Procedure: PERIPHERAL VASCULAR INTERVENTION; Surgeon: [name], MD; Location: [place] [facility]; Service: Cardiovascular; Laterality: Right;  peroneal stents  . REVISON OF ARTERIOVENOUS FISTULA Right 3/49/1791   Procedure: Plication OF Right Arm ARTERIOVENOUS FISTULA; Surgeon: Elam Dutch, MD; Location: [facility] OR; Service: Vascular; Laterality: Right;  . REVISON OF ARTERIOVENOUS FISTULA Left 04/22/2018   Procedure: REVISION OF RADIOCEPHALIC ARTERIOVENOUS FISTULA; Surgeon: [name], MD; Location: [place]; Service: Vascular; Laterality: Left;  . SHUNTOGRAM N/A 05/31/2013   Procedure: Earney Mallet; Surgeon: [name], MD; Location[facility] CATH [facility]; Service: Cardiovascular; Laterality: N/A;  . UPPER EXTREMITY VENOGRAPHY Right 07/23/2018   Procedure: UPPER EXTREMITY VENOGRAPHY; Surgeon: [name], MD; Location: [place] [facility]; Service: Cardiovascular; Laterality: Right;  . WOUND DEBRIDEMENT Right 07/17/2018   Procedure: Wound Debridement; Closure Filleted toe flap Right Foot; Surgeon: [name], DPM; Location: [place]; Service: Podiatry; Laterality: Right;  . WOUND DEBRIDEMENT Right 09/07/2018   Procedure: Debridement Right Foot Wound, application of wound vac; Surgeon: [name], DPM; Location: [place]; Service: Podiatry; Laterality: Right;  . WOUND DEBRIDEMENT Right 03/08/2019   Procedure: DEBRIDEMENT WOUND RIGHT FOOT; Surgeon: [name], DPM; Location: [place]; Service: Podiatry; Laterality: Right;         Current Outpatient Medications on File Prior to Visit  Medication Sig Dispense Refill  . acetaminophen (TYLENOL) 500 MG tablet Take 500 mg by mouth every 6 (six) hours as needed for mild pain or headache.    . Amino Acids-Protein Hydrolys (FEEDING SUPPLEMENT, PRO-STAT  SUGAR FREE 64,) LIQD Take 30 mLs by mouth 2 (two) times daily. 887 mL 0  . aspirin EC 81 MG tablet Take 1 tablet (81 mg total) by mouth daily. 150 tablet 2  . AURYXIA 1 GM 210 MG(Fe) tablet Take 420 mg by mouth 3 (three) times daily.    . bisacodyl (DULCOLAX) 5 MG EC tablet Take 1 tablet (5 mg total) by mouth daily as needed for  moderate constipation. 30 tablet 0  . calcitRIOL (ROCALTROL) 0.5 MCG capsule Take 1 capsule (0.5 mcg total) by mouth every Monday, Wednesday, and Friday with hemodialysis. 30 capsule 0  . Calcium Carbonate Antacid (CALCIUM CARBONATE, DOSED IN MG ELEMENTAL CALCIUM,) 1250 MG/5ML SUSP Take 5 mLs (500 mg of elemental calcium total) by mouth every 6 (six) hours as needed for indigestion. 450 mL 0  . cinacalcet (SENSIPAR) 30 MG tablet Take 1 tablet (30 mg total) by mouth every other day. 60 tablet 0  . clopidogrel (PLAVIX) 75 MG tablet Take 1 tablet (75 mg total) by mouth daily with breakfast. 30 tablet 0  . colchicine 0.6 MG tablet Take 0.6 mg by mouth daily as needed (as directed for gout flares).   1  . collagenase (SANTYL) ointment Apply topically daily. Apply as directed per home health nurse. Apply to right foot wound edge to edge nickel thick. Cover with wet gauze followed by dry gauze and change daily 15 g 0  . docusate sodium (COLACE) 100 MG capsule Take 2 capsules (200 mg total) by mouth 2 (two) times daily. 10 capsule 0  . heparin 1000 UNIT/ML injection Heparin Sodium (Porcine) 1,000 Units/mL Catheter Lock Venous    . methocarbamol (ROBAXIN) 500 MG tablet Take 1 tablet (500 mg total) by mouth every 12 (twelve) hours as needed for muscle spasms. 60 tablet 0  . midodrine (PROAMATINE) 10 MG tablet Take 1 tablet (10 mg total) by mouth every Monday, Wednesday, and Friday with hemodialysis. 60 tablet 6  . Multiple Vitamin (MULTIVITAMIN) capsule Take 1 capsule by mouth daily.    . multivitamin (RENA-VIT) TABS tablet Take 1 tablet by mouth at bedtime. 30 tablet 0  . oxyCODONE (OXY IR/ROXICODONE) 5 MG immediate release tablet Take 5 mg by mouth every 6 (six) hours as needed for severe pain.    . rosuvastatin (CRESTOR) 10 MG tablet Take 1 tablet (10 mg total) by mouth daily at 6 PM. 30 tablet 0  . saccharomyces boulardii (FLORASTOR) 250 MG capsule Take by mouth.    . silver sulfADIAZINE (SILVADENE) 1 %  cream Apply pea-sized amount to wound daily. 50 g 0  . Wound Dressings (MEDIHONEY WOUND/BURN DRESSING) GEL Apply to affected area three times a week     No current facility-administered medications on file prior to visit.    Physical exam:   Vitals:   08/09/19 0624  BP: 102/65  Pulse: 93  Resp: 18  Temp: 97.8 F (36.6 C)  TempSrc: Oral  SpO2: 99%  Weight: 73.9 kg  Height: 5\' 8"  (1.727 m)   Extremities: 2+ brachial pulse absent radial pulse bilaterally occluded left forearm AV fistula minimal flow palpated in the left upper arm AV fistula occluded right upper arm fistula  Left BKA dry eschar removed 1 cm x 2 mm skin opening which is clean and granulating  Data: Patient had a duplex ultrasound of his left arm today which showed small radial artery with monophasic flow 2 mm brachial artery was 5 mm the AV fistula was patent with a diameter  of 5 to 6 mm.  Assessment: Patient still has a wound on his right foot. He may have some risk of developing a infection in her right arm AV graft. His fistula on the left arm is still patent. I believe we should at least try to see if we can salvage this first and we will repeat his fistulogram before proceeding with a right arm graft placement. Since the wound is being managed currently by [name] and there is no active ongoing infection I do believe her right arm AV graft would have lower risk of infection than the catheter. If he truly does have central venous occlusion on the left side that cannot be recovered with an intervention then we would proceed with a right upper arm AV graft.  Plan: Left arm fistulogram scheduled for first week of December and we will adjust his dialysis day to accommodate that.  Depending on the fistulogram findings we will decide on whether or not the left arm fistula is usable versus whether or not to place a right upper arm graft.  Patient will continue to protect his left below-knee amputation and this should heal within  the next few weeks.  Ruta Hinds, MD  [facility] [place]  Office: [phone]

## 2019-08-09 NOTE — Discharge Instructions (Signed)
° °  [facility] [place] ° °Discharge Instructions ° °AV Fistula or Graft Surgery for Dialysis Access ° °Please refer to the following instructions for your post-procedure care. Your surgeon or physician assistant will discuss any changes with you. ° °Activity ° °You may drive the day following your surgery, if you are comfortable and no longer taking prescription pain medication. Resume full activity as the soreness in your incision resolves. ° °Bathing/Showering ° °You may shower after you go home. Keep your incision dry for 48 hours. Do not soak in a bathtub, hot tub, or swim until the incision heals completely. You may not shower if you have a hemodialysis catheter. ° °Incision Care ° °Clean your incision with mild soap and water after 48 hours. Pat the area dry with a clean towel. You do not need a bandage unless otherwise instructed. Do not apply any ointments or creams to your incision. You may have skin glue on your incision. Do not peel it off. It will come off on its own in about one week. Your arm may swell a bit after surgery. To reduce swelling use pillows to elevate your arm so it is above your heart. Your doctor will tell you if you need to lightly wrap your arm with an ACE bandage. ° °Diet ° °Resume your normal diet. There are not special food restrictions following this procedure. In order to heal from your surgery, it is CRITICAL to get adequate nutrition. Your body requires vitamins, minerals, and protein. Vegetables are the best source of vitamins and minerals. Vegetables also provide the perfect balance of protein. Processed food has little nutritional value, so try to avoid this. ° °Medications ° °Resume taking all of your medications. If your incision is causing pain, you may take over-the counter pain relievers such as acetaminophen (Tylenol). If you were prescribed a stronger pain medication, please be aware these medications can cause nausea and constipation. Prevent  nausea by taking the medication with a snack or meal. Avoid constipation by drinking plenty of fluids and eating foods with high amount of fiber, such as fruits, vegetables, and grains. Do not take Tylenol if you are taking prescription pain medications. ° ° ° ° °Follow up °Your surgeon may want to see you in the office following your access surgery. If so, this will be arranged at the time of your surgery. ° °Please call us immediately for any of the following conditions: ° °Increased pain, redness, drainage (pus) from your incision site °Fever of 101 degrees or higher °Severe or worsening pain at your incision site °Hand pain or numbness. ° °Reduce your risk of vascular disease: ° °Stop smoking. If you would like help, call [facility] at 1-800-QUIT-NOW (1-800-784-8669) or [place] at 336-586-4000 ° °Manage your cholesterol °Maintain a desired weight °Control your diabetes °Keep your blood pressure down ° °Dialysis ° °It will take several weeks to several months for your new dialysis access to be ready for use. Your surgeon will determine when it is OK to use it. Your nephrologist will continue to direct your dialysis. You can continue to use your Permcath until your new access is ready for use. ° °If you have any questions, please call the office at [phone]. ° °

## 2019-08-10 NOTE — Anesthesia Postprocedure Evaluation (Signed)
Anesthesia Post Note  Patient: Timothy Faulkner  Procedure(s) Performed: INSERTION OF RIGHT ARTERIOVENOUS (AV) 4-59mm GORE-TEX STRETCH GRAFT RIGHT  ARM (Right )     Anesthesia Post Evaluation  Last Vitals:  Vitals:   08/09/19 1100 08/09/19 1133  BP: 103/66 109/63  Pulse: 82 81  Resp: 15 16  Temp: 36.6 C   SpO2: 97% 96%    Last Pain:  Vitals:   08/09/19 1045  TempSrc:   PainSc: 0-No pain   Pain Goal:                   [name]

## 2019-08-14 NOTE — Progress Notes (Signed)
Subjective:  Patient ID: Timothy Faulkner, male    DOB: 03/22/1946,  MRN: [id number]  No chief complaint on file.   74 y.o. male presents for wound care.  Using Silvadene on the wound thinks that is doing much better  Past Medical History:  Diagnosis Date  . Anemia   . Arthritis    Gout  . Automatic implantable cardioverter-defibrillator in situ    [employer]  . CHF   . ESRD on dialysis [facility])    [name] 03/11/2013 (03/11/2013) dialysis M/W/F  . Gangrene ([place])    left foot  . GERD (gastroesophageal reflux disease)   . Gout    "once a year"  . Heart murmur   . HYPERCHOLESTEROLEMIA, MIXED   . HYPERTENSION   . Macular degeneration   . Osteomyelitis ([place])    right foot  . 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 Pacemaker   . Peripheral arterial disease (HCC)    left fifth toe ulcer, healing  . Pneumonia   . Shortness of breath   . 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 Wears dentures   . Wears glasses     Current Outpatient Medications:  .  acetaminophen (TYLENOL) 500 MG tablet, Take 500 mg by mouth every 6 (six) hours as needed for mild pain or headache., Disp: , Rfl:  .  Amino Acids-Protein Hydrolys (FEEDING SUPPLEMENT, PRO-STAT SUGAR FREE 64,) LIQD, Take 30 mLs by mouth 2 (two) times daily. (Patient not taking: Reported on 07/12/2019), Disp: 887 mL, Rfl: 0 .  aspirin EC 81 MG tablet, Take 1 tablet (81 mg total) by mouth daily., Disp: 150 tablet, Rfl: 2 .  AURYXIA 1 GM 210 MG(Fe) tablet, Take 210 mg by mouth 3 (three) times daily. , Disp: , Rfl:  .  bisacodyl (DULCOLAX) 5 MG EC tablet, Take 1 tablet (5 mg total) by mouth daily as needed for moderate constipation. (Patient not taking: Reported on 07/12/2019), Disp: 30 tablet, Rfl: 0 .  calcitRIOL (ROCALTROL) 0.25 MCG capsule, Take 0.25 mcg by mouth every Monday, Wednesday, and Friday., Disp: , Rfl:  .  Calcium Carbonate Antacid (CALCIUM CARBONATE, DOSED IN MG ELEMENTAL  CALCIUM,) 1250 MG/5ML SUSP, Take 5 mLs (500 mg of elemental calcium total) by mouth every 6 (six) hours as needed for indigestion. (Patient not taking: Reported on 07/12/2019), Disp: 450 mL, Rfl: 0 .  cinacalcet (SENSIPAR) 30 MG tablet, Take 1 tablet (30 mg total) by mouth every other day. (Patient not taking: Reported on 07/12/2019), Disp: 60 tablet, Rfl: 0 .  clopidogrel (PLAVIX) 75 MG tablet, Take 1 tablet (75 mg total) by mouth daily with breakfast., Disp: 30 tablet, Rfl: 0 .  colchicine 0.6 MG tablet, Take 0.6 mg by mouth daily as needed (as directed for gout flares). , Disp: , Rfl: 1 .  collagenase (SANTYL) ointment, Apply topically daily. Apply as directed per home health nurse.  Apply to right foot wound edge to edge nickel thick.  Cover with wet gauze followed by dry gauze and change daily (Patient not taking: Reported on 07/12/2019), Disp: 15 g, Rfl: 0 .  docusate sodium (COLACE) 100 MG capsule, Take 2 capsules (200 mg total) by mouth 2 (two) times daily. (Patient not taking: Reported on 07/12/2019), Disp: 10 capsule, Rfl: 0 .  HYDROcodone-acetaminophen (NORCO) 5-325 MG tablet, Take 1 tablet by mouth every 6 (six) hours as needed for moderate pain., Disp: 12 tablet, Rfl: 0 .  methocarbamol (  ROBAXIN) 500 MG tablet, Take 1 tablet (500 mg total) by mouth every 12 (twelve) hours as needed for muscle spasms. (Patient not taking: Reported on 07/12/2019), Disp: 60 tablet, Rfl: 0 .  midodrine (PROAMATINE) 10 MG tablet, Take 1 tablet (10 mg total) by mouth every Monday, Wednesday, and Friday with hemodialysis., Disp: 60 tablet, Rfl: 6 .  rosuvastatin (CRESTOR) 10 MG tablet, Take 1 tablet (10 mg total) by mouth daily at 6 PM. (Patient not taking: Reported on 07/12/2019), Disp: 30 tablet, Rfl: 0 .  silver sulfADIAZINE (SILVADENE) 1 % cream, Apply pea-sized amount to wound daily. (Patient not taking: Reported on 07/12/2019), Disp: 50 g, Rfl: 0  Social History   Tobacco Use  Smoking Status Former Smoker  .  Packs/day: 0.75  . Years: 30.00  . Pack years: 22.50  . Types: Cigarettes  . Quit date: 11/25/1992  . Years since quitting: 26.7  Smokeless Tobacco Never Used    No Known Allergies Objective:  There were no vitals filed for this visit. There is no height or weight on file to calculate BMI. Constitutional Well developed.  Vascular Right foot warm. Good capillary refill to remaining digits.  Neurologic Normal speech. Oriented to person, place, and time. Protective sensation absent  Dermatologic  wound to the right foot epithelialized without warmth erythema signs of infection  Orthopedic: No pain to palpation.   Radiographs: None Assessment:   No diagnosis found. Plan:  Patient was evaluated and treated and all questions answered.  Ulcer right foot -Wound epithelialized.   Return in about 6 weeks (around 08/25/2019) for Diabetic Foot Care.

## 2019-08-22 ENCOUNTER — Encounter: Payer: Medicare Other | Admitting: Physical Medicine and Rehabilitation

## 2019-08-25 ENCOUNTER — Other Ambulatory Visit: Payer: Self-pay

## 2019-08-25 ENCOUNTER — Ambulatory Visit (INDEPENDENT_AMBULATORY_CARE_PROVIDER_SITE_OTHER): Payer: Medicare Other | Admitting: Podiatry

## 2019-08-25 DIAGNOSIS — E1169 Type 2 diabetes mellitus with other specified complication: Secondary | ICD-10-CM

## 2019-08-25 DIAGNOSIS — B351 Tinea unguium: Secondary | ICD-10-CM | POA: Diagnosis not present

## 2019-08-25 DIAGNOSIS — E1151 Type 2 diabetes mellitus with diabetic peripheral angiopathy without gangrene: Secondary | ICD-10-CM

## 2019-08-25 DIAGNOSIS — E08621 Diabetes mellitus due to underlying condition with foot ulcer: Secondary | ICD-10-CM | POA: Diagnosis not present

## 2019-08-25 DIAGNOSIS — L97511 Non-pressure chronic ulcer of other part of right foot limited to breakdown of skin: Secondary | ICD-10-CM

## 2019-08-25 NOTE — Progress Notes (Signed)
  Subjective:  Patient ID: Timothy Faulkner, male    DOB: [birth year],  MRN: [id number]  Chief Complaint  Patient presents with  . Wound Check    Pt states no concerns, denies fever/nausea/vomiting/chills.    74 y.o. male presents for wound care. Hx confirmed with patient.  Objective:  Physical Exam: L side BKA noted Right foot lateral 4th/5ht met wound fully healed. Onychomycosis of remaining digits right foot 1st, 2nd, 3rd toes  Assessment:   1. 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 Onychomycosis of multiple toenails with type 2 diabetes mellitus and peripheral angiopathy ([place])      Plan:  Patient was evaluated and treated and all questions answered.  Ulcer Right Foot -Healed -Make appt for DM shoes -Wear surgical shoe until that time  Onychomycosis, Amputation Hx -Nails debrided x3  Return in about 9 weeks (around 10/27/2019) for Diabetic Foot Care.

## 2019-08-27 NOTE — Progress Notes (Signed)
ICD remote 

## 2019-08-31 NOTE — Progress Notes (Signed)
POST OPERATIVE OFFICE NOTE    CC:  F/u for surgery  HPI:  This is a 74 y.o. male who is s/p Left AV graft on 08/09/19 by Dr. Oneida Alar.  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   He is here today for f/u examination.  He denise pain, loss of motor or loss of sensation in the right UE or left LE.  No Known Allergies  Current Outpatient Medications  Medication Sig Dispense Refill  . acetaminophen (TYLENOL) 500 MG tablet Take 500 mg by mouth every 6 (six) hours as needed for mild pain or headache.    . Amino Acids-Protein Hydrolys (FEEDING SUPPLEMENT, PRO-STAT SUGAR FREE 64,) LIQD Take 30 mLs by mouth 2 (two) times daily. 887 mL 0  . aspirin EC 81 MG tablet Take 1 tablet (81 mg total) by mouth daily. 150 tablet 2  . AURYXIA 1 GM 210 MG(Fe) tablet Take 210 mg by mouth 3 (three) times daily.     . bisacodyl (DULCOLAX) 5 MG EC tablet Take 1 tablet (5 mg total) by mouth daily as needed for moderate constipation. 30 tablet 0  . calcitRIOL (ROCALTROL) 0.25 MCG capsule Take 0.25 mcg by mouth every Monday, Wednesday, and Friday.    . Calcium Carbonate Antacid (CALCIUM CARBONATE, DOSED IN MG ELEMENTAL CALCIUM,) 1250 MG/5ML SUSP Take 5 mLs (500 mg of elemental calcium total) by mouth every 6 (six) hours as needed for indigestion. 450 mL 0  . cinacalcet (SENSIPAR) 30 MG tablet Take 1 tablet (30 mg total) by mouth every other day. 60 tablet 0  . clopidogrel (PLAVIX) 75 MG tablet Take 1 tablet (75 mg total) by mouth daily with breakfast. 30 tablet 0  . colchicine 0.6 MG tablet Take 0.6 mg by mouth daily as needed (as directed for gout flares).   1  . collagenase (SANTYL) ointment Apply topically daily. Apply as directed per home health nurse.  Apply to right foot wound edge to edge nickel thick.  Cover with wet gauze followed by dry gauze and change daily 15 g 0  . docusate sodium (COLACE) 100 MG capsule Take 2 capsules (200 mg total) by mouth 2 (two) times daily. 10 capsule 0  . HYDROcodone-acetaminophen (NORCO) 5-325 MG  tablet Take 1 tablet by mouth every 6 (six) hours as needed for moderate pain. 12 tablet 0  . methocarbamol (ROBAXIN) 500 MG tablet Take 1 tablet (500 mg total) by mouth every 12 (twelve) hours as needed for muscle spasms. 60 tablet 0  . midodrine (PROAMATINE) 10 MG tablet Take 1 tablet (10 mg total) by mouth every Monday, Wednesday, and Friday with hemodialysis. 60 tablet 6  . oxyCODONE (OXY IR/ROXICODONE) 5 MG immediate release tablet Take by mouth.    . rosuvastatin (CRESTOR) 10 MG tablet Take 1 tablet (10 mg total) by mouth daily at 6 PM. 30 tablet 0  . silver sulfADIAZINE (SILVADENE) 1 % cream Apply pea-sized amount to wound daily. 50 g 0   No current facility-administered medications for this visit.     ROS:  See HPI  Physical Exam:    Incision:  Right UE well healed, left BKA incision well healed. Extremities:  Palpable right radial pulse, grip 5/5.   Left stump warm to touch Lungs non labored breathing   Assessment/Plan:  This is a 74 y.o. male who is s/p: 1.) Right AV graft placement 2.) Left BKA  The graft may accessed as of Sep 12 2019. The BKA is well healed and he is ready for  prothesis fitting.  F/U PRN in the future.     Roxy Horseman , PA-C [facility] [phone]  Clinic MD:  Oneida Alar

## 2019-09-01 ENCOUNTER — Encounter: Payer: Self-pay | Admitting: Physician Assistant

## 2019-09-01 ENCOUNTER — Ambulatory Visit (INDEPENDENT_AMBULATORY_CARE_PROVIDER_SITE_OTHER): Payer: Self-pay | Admitting: Physician Assistant

## 2019-09-01 ENCOUNTER — Other Ambulatory Visit: Payer: Self-pay

## 2019-09-01 VITALS — BP 140/92 | HR 86 | Temp 98.2°F | Resp 22 | Ht 68.0 in | Wt 166.0 lb

## 2019-09-01 DIAGNOSIS — Z992 Dependence on renal dialysis: Secondary | ICD-10-CM

## 2019-09-01 DIAGNOSIS — N186 End stage renal disease: Secondary | ICD-10-CM

## 2019-09-01 DIAGNOSIS — Z89512 Acquired absence of left leg below knee: Secondary | ICD-10-CM

## 2019-09-07 IMAGING — US US ABDOMEN LIMITED
1 series · 14 of 25 positions shown · non-contrast
Comparison: None.

CLINICAL DATA: Abnormal liver function tests.

EXAM:
ULTRASOUND ABDOMEN LIMITED RIGHT UPPER QUADRANT

[Series 1: us abdomen limited · 14 of 50 slices shown]
[im 1/50]
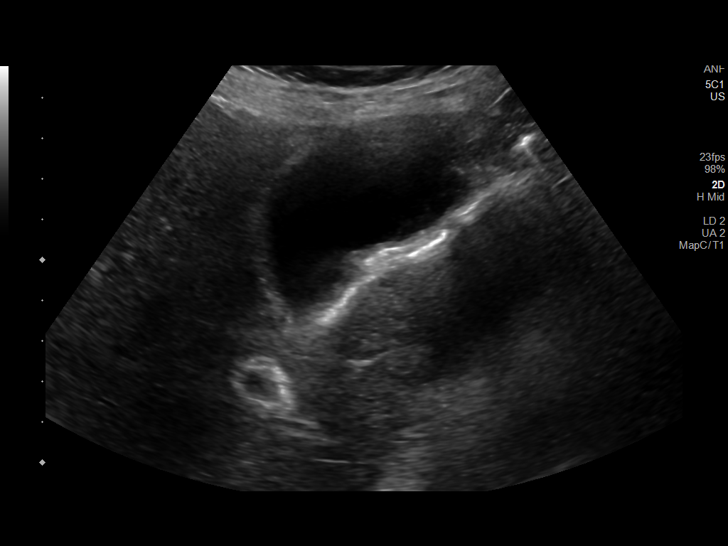
[im 5/50]
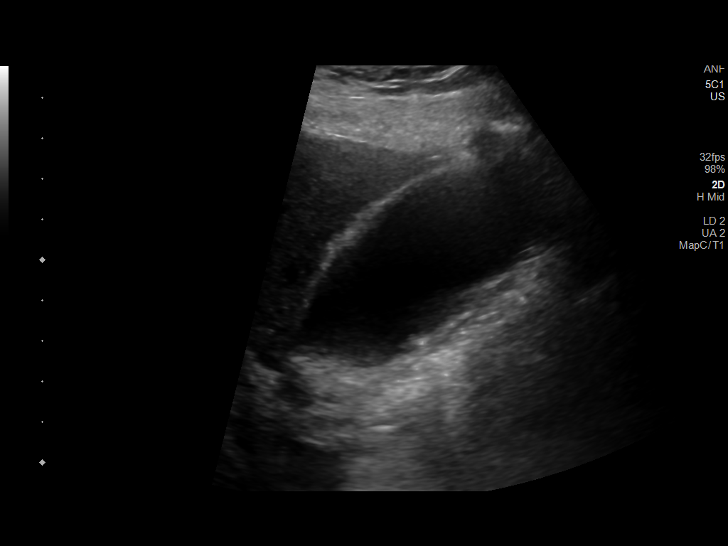
[im 9/50]
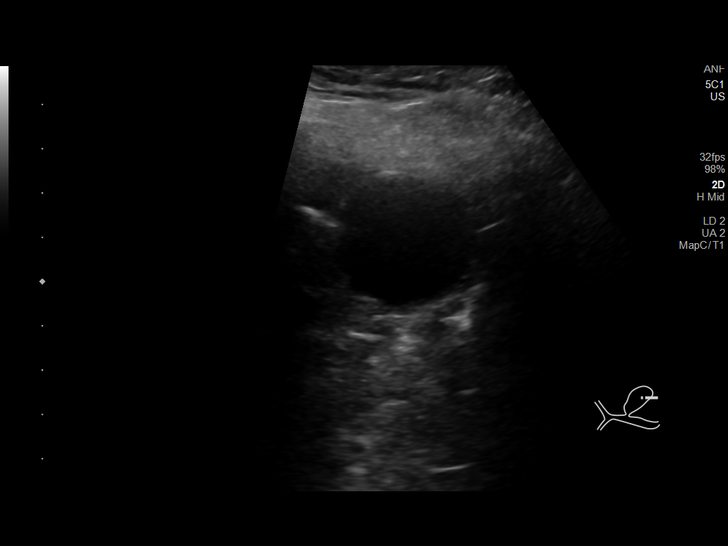
[im 13/50]
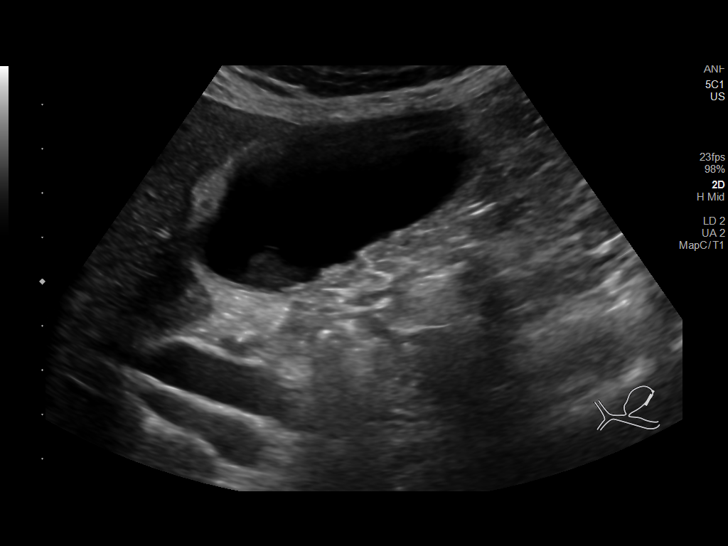
[im 17/50]
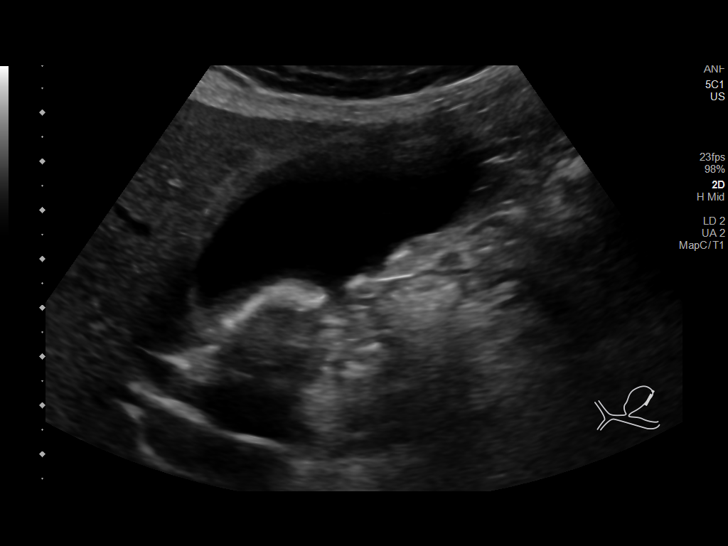
[im 19/50]
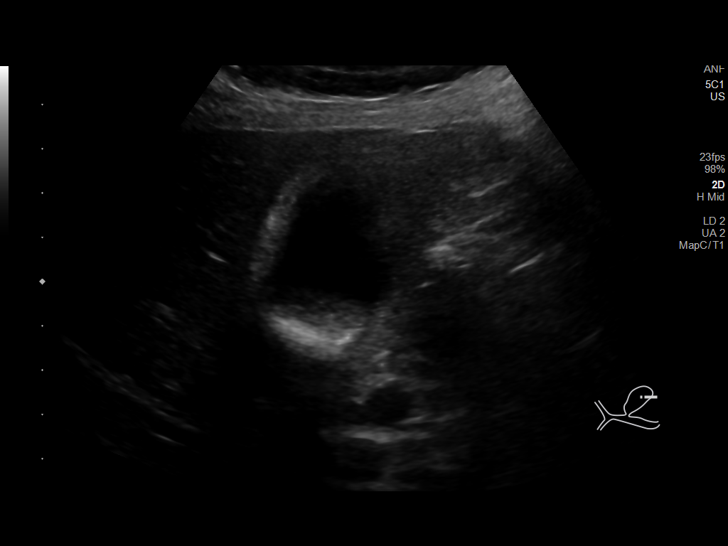
[im 23/50]
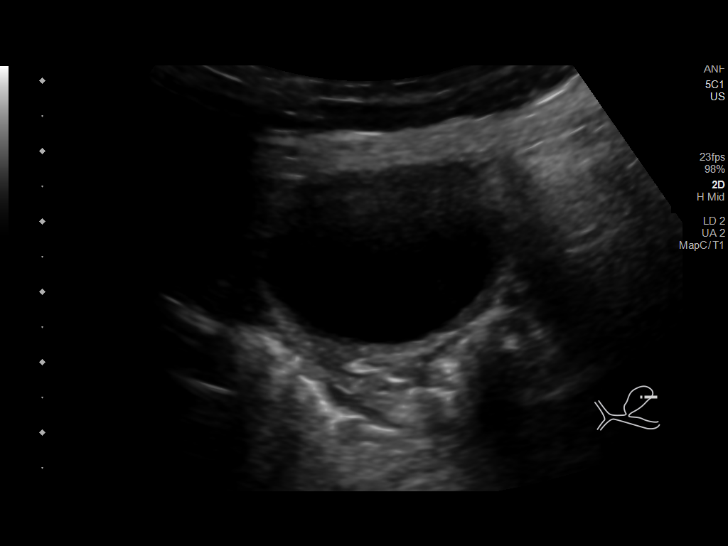
[im 27/50]
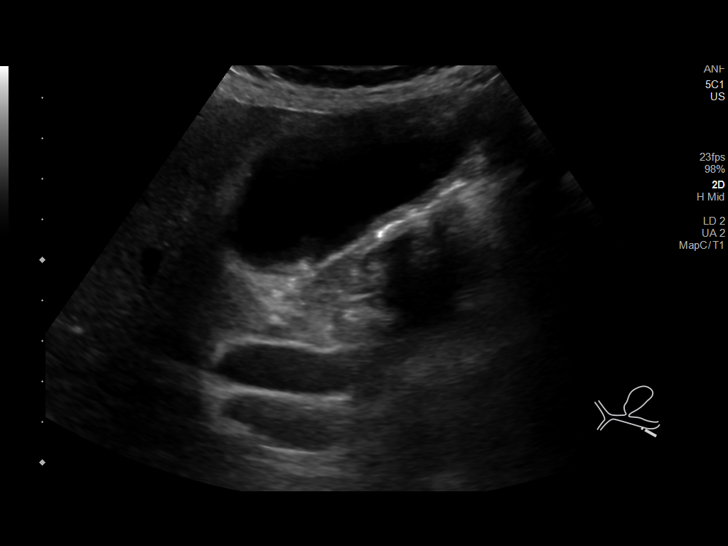
[im 31/50]
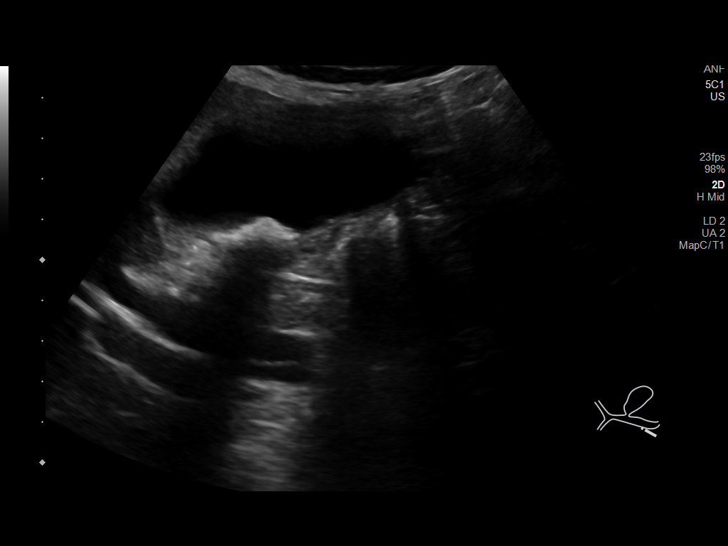
[im 33/50]
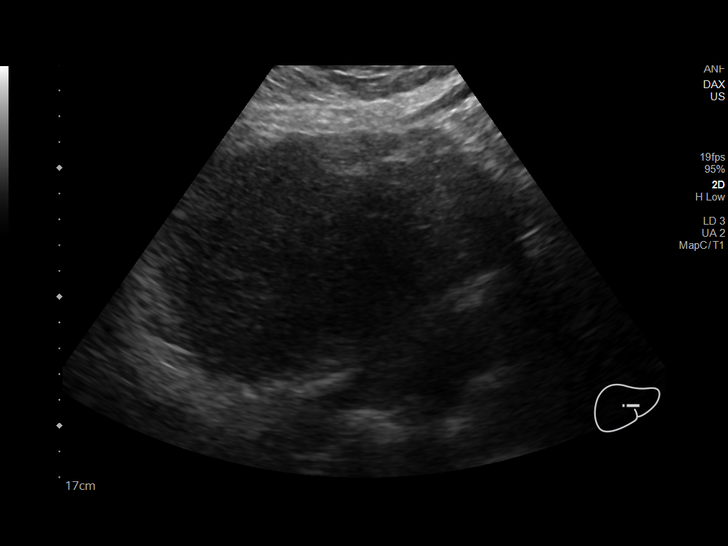
[im 37/50]
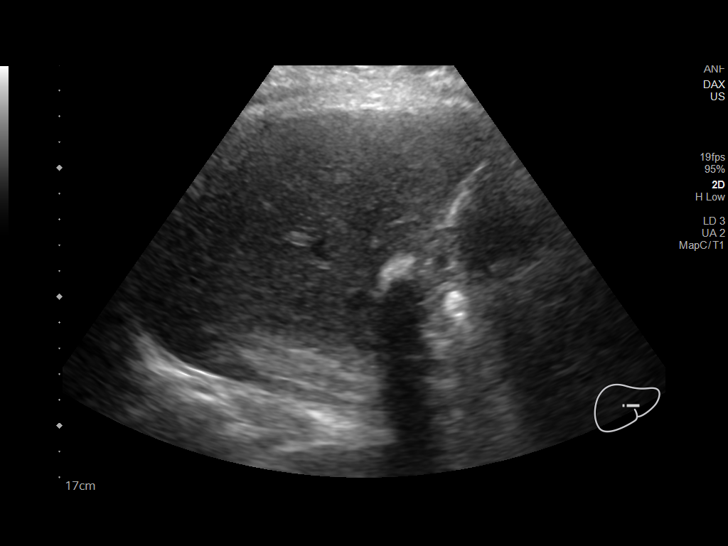
[im 41/50]
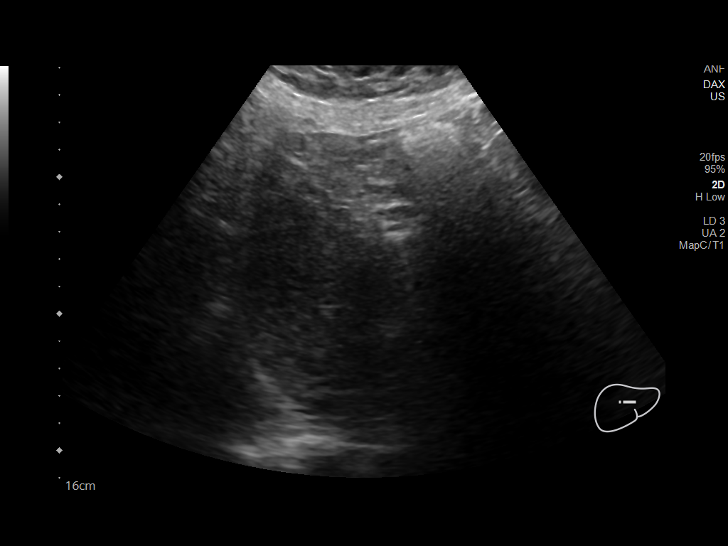
[im 45/50]
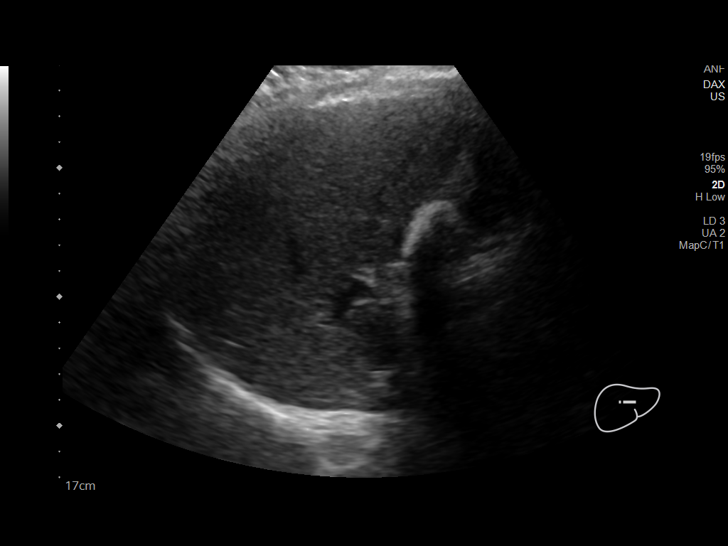
[im 50/50]
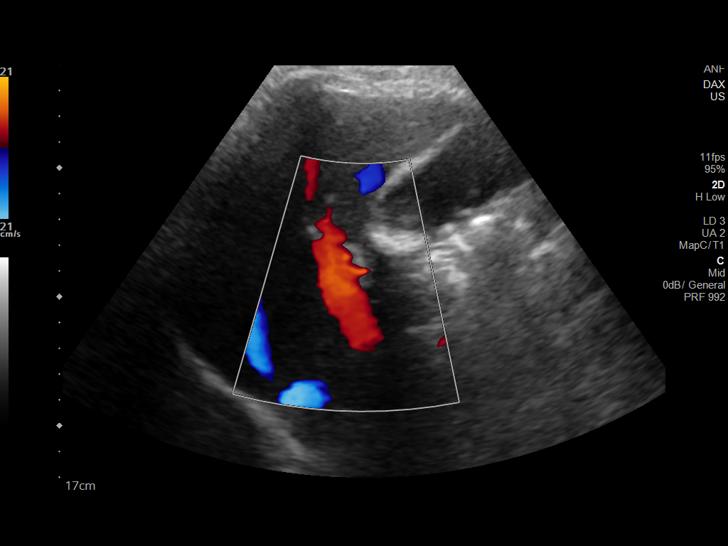

[14 of 25 positions shown; findings below may reference images not displayed]

FINDINGS: Gallbladder:

Gallbladder is moderately distended to 3.3 cm. Mild gallbladder wall
thickening to 6 mm. No pericholecystic fluid. Several small
gallstones collect dependently within the gallbladder. Negative
sonographic Murphy's sign.

Common bile duct:

Diameter: Dilated to 12 mm.

Liver:

No focal lesion identified. Within normal limits in parenchymal
echogenicity. Portal vein is patent on color Doppler imaging with
normal direction of blood flow towards the liver.

Other: Lobular echogenic RIGHT kidney.
IMPRESSION: 1. Mild gallbladder distension with gallstones. No evidence acute
cholecystitis.
2. Dilated common bile duct. With above noted gallstones, concern
for choledocholithiasis. Consider MRCP for further evaluation.

## 2019-09-08 ENCOUNTER — Other Ambulatory Visit: Payer: Medicare Other | Admitting: Orthotics

## 2019-09-12 DIAGNOSIS — Z992 Dependence on renal dialysis: Secondary | ICD-10-CM | POA: Diagnosis not present

## 2019-09-12 DIAGNOSIS — N186 End stage renal disease: Secondary | ICD-10-CM | POA: Diagnosis not present

## 2019-09-12 DIAGNOSIS — D689 Coagulation defect, unspecified: Secondary | ICD-10-CM | POA: Diagnosis not present

## 2019-09-12 DIAGNOSIS — D631 Anemia in chronic kidney disease: Secondary | ICD-10-CM | POA: Diagnosis not present

## 2019-09-12 DIAGNOSIS — N2581 Secondary hyperparathyroidism of renal origin: Secondary | ICD-10-CM | POA: Diagnosis not present

## 2019-09-12 DIAGNOSIS — T8249XS Other complication of vascular dialysis catheter, sequela: Secondary | ICD-10-CM | POA: Diagnosis not present

## 2019-09-12 DIAGNOSIS — D509 Iron deficiency anemia, unspecified: Secondary | ICD-10-CM | POA: Diagnosis not present

## 2019-09-12 DIAGNOSIS — E119 Type 2 diabetes mellitus without complications: Secondary | ICD-10-CM | POA: Diagnosis not present

## 2019-09-13 ENCOUNTER — Other Ambulatory Visit: Payer: Medicare Other | Admitting: Orthotics

## 2019-09-14 ENCOUNTER — Telehealth: Payer: Self-pay | Admitting: *Deleted

## 2019-09-14 DIAGNOSIS — N2581 Secondary hyperparathyroidism of renal origin: Secondary | ICD-10-CM | POA: Diagnosis not present

## 2019-09-14 DIAGNOSIS — D509 Iron deficiency anemia, unspecified: Secondary | ICD-10-CM | POA: Diagnosis not present

## 2019-09-14 DIAGNOSIS — D689 Coagulation defect, unspecified: Secondary | ICD-10-CM | POA: Diagnosis not present

## 2019-09-14 DIAGNOSIS — Z992 Dependence on renal dialysis: Secondary | ICD-10-CM | POA: Diagnosis not present

## 2019-09-14 DIAGNOSIS — D631 Anemia in chronic kidney disease: Secondary | ICD-10-CM | POA: Diagnosis not present

## 2019-09-14 DIAGNOSIS — N186 End stage renal disease: Secondary | ICD-10-CM | POA: Diagnosis not present

## 2019-09-14 DIAGNOSIS — T8249XS Other complication of vascular dialysis catheter, sequela: Secondary | ICD-10-CM | POA: Diagnosis not present

## 2019-09-14 DIAGNOSIS — E119 Type 2 diabetes mellitus without complications: Secondary | ICD-10-CM | POA: Diagnosis not present

## 2019-09-14 NOTE — Telephone Encounter (Signed)
Received call from [place] at [facility] she states patient is c/o shoulder pain in his Graft arm. Spoke with Gwenette Greet our PA she recommended patient be scheduled for office visit to evaluate patient. Patient given appt for tomorrow at 10:15 with PA.

## 2019-09-15 ENCOUNTER — Ambulatory Visit (INDEPENDENT_AMBULATORY_CARE_PROVIDER_SITE_OTHER): Payer: Self-pay | Admitting: Physician Assistant

## 2019-09-15 ENCOUNTER — Other Ambulatory Visit: Payer: Self-pay

## 2019-09-15 VITALS — BP 137/90 | HR 47 | Temp 97.0°F | Resp 18 | Ht 68.0 in | Wt 166.0 lb

## 2019-09-15 DIAGNOSIS — Z992 Dependence on renal dialysis: Secondary | ICD-10-CM

## 2019-09-15 DIAGNOSIS — N186 End stage renal disease: Secondary | ICD-10-CM

## 2019-09-15 NOTE — Progress Notes (Signed)
POST OPERATIVE OFFICE NOTE    CC:  F/u for surgery  HPI:  This is a 74 y.o. male who is s/p status post right upper arm arteriovenous graft placement on 08/09/2019.  He presents today with complaints of right deltoid pain.  He states his graft has not been accessed, although he was evaluated here on January 21 with instructions that the graft could be accessed on February 1.  He is wheelchair-bound and denies recent fall or injury transferring in and out of his wheelchair.  He states he had a Covid vaccine shot but cannot remember into which arm it was placed.  Dialyzing via IJ catheter without complications  Dialysis clinic: [facility] Treatment days: Monday, Wednesday, Friday  No Known Allergies  Current Outpatient Medications  Medication Sig Dispense Refill  . acetaminophen (TYLENOL) 500 MG tablet Take 500 mg by mouth every 6 (six) hours as needed for mild pain or headache.    . Amino Acids-Protein Hydrolys (FEEDING SUPPLEMENT, PRO-STAT SUGAR FREE 64,) LIQD Take 30 mLs by mouth 2 (two) times daily. 887 mL 0  . aspirin EC 81 MG tablet Take 1 tablet (81 mg total) by mouth daily. 150 tablet 2  . AURYXIA 1 GM 210 MG(Fe) tablet Take 210 mg by mouth 3 (three) times daily.     . bisacodyl (DULCOLAX) 5 MG EC tablet Take 1 tablet (5 mg total) by mouth daily as needed for moderate constipation. 30 tablet 0  . calcitRIOL (ROCALTROL) 0.25 MCG capsule Take 0.25 mcg by mouth every Monday, Wednesday, and Friday.    . Calcium Carbonate Antacid (CALCIUM CARBONATE, DOSED IN MG ELEMENTAL CALCIUM,) 1250 MG/5ML SUSP Take 5 mLs (500 mg of elemental calcium total) by mouth every 6 (six) hours as needed for indigestion. 450 mL 0  . cinacalcet (SENSIPAR) 30 MG tablet Take 1 tablet (30 mg total) by mouth every other day. 60 tablet 0  . clopidogrel (PLAVIX) 75 MG tablet Take 1 tablet (75 mg total) by mouth daily with breakfast. 30 tablet 0  . colchicine 0.6 MG tablet Take 0.6 mg by mouth daily as needed (as  directed for gout flares).   1  . collagenase (SANTYL) ointment Apply topically daily. Apply as directed per home health nurse.  Apply to right foot wound edge to edge nickel thick.  Cover with wet gauze followed by dry gauze and change daily 15 g 0  . docusate sodium (COLACE) 100 MG capsule Take 2 capsules (200 mg total) by mouth 2 (two) times daily. 10 capsule 0  . HYDROcodone-acetaminophen (NORCO) 5-325 MG tablet Take 1 tablet by mouth every 6 (six) hours as needed for moderate pain. 12 tablet 0  . methocarbamol (ROBAXIN) 500 MG tablet Take 1 tablet (500 mg total) by mouth every 12 (twelve) hours as needed for muscle spasms. 60 tablet 0  . midodrine (PROAMATINE) 10 MG tablet Take 1 tablet (10 mg total) by mouth every Monday, Wednesday, and Friday with hemodialysis. 60 tablet 6  . oxyCODONE (OXY IR/ROXICODONE) 5 MG immediate release tablet Take by mouth.    . rosuvastatin (CRESTOR) 10 MG tablet Take 1 tablet (10 mg total) by mouth daily at 6 PM. 30 tablet 0  . silver sulfADIAZINE (SILVADENE) 1 % cream Apply pea-sized amount to wound daily. 50 g 0   No current facility-administered medications for this visit.     ROS:  See HPI  Physical Exam:  Vitals:   09/15/19 1043  BP: 137/90  Pulse: (!) 47  Resp:  18  Temp: (!) 97 F (36.1 C)  TempSrc: Temporal  SpO2: (!) 78%  Weight: 166 lb (75.3 kg)  Height: 5\' 8"  (1.727 m)    Incision: Right upper extremity incisions healing without signs of infection Extremities: Dopplerable radial and ulnar arterial signals.  Good bruit and thrill in graft.  The area of his pain is the right deltoid and there are no skin changes or palpable masses.  He has normal passive range of motion in the right shoulder Neuro: Oriented  Assessment/Plan:  This is a 74 y.o. male who is s/p: Right upper extremity AV graft placement 5 weeks ago.  Etiology of his right deltoid pain unclear.  This could be muscle strain while transferring from wheelchair.  -Okay to begin  accessing right upper extremity AV graft.  Recommend warm compresses to right deltoid and or topical analgesics. Follow-up as needed   Barbie Banner, PA-C [facility] [phone]  Clinic MD: Oneida Alar

## 2019-09-16 DIAGNOSIS — D631 Anemia in chronic kidney disease: Secondary | ICD-10-CM | POA: Diagnosis not present

## 2019-09-16 DIAGNOSIS — T8249XS Other complication of vascular dialysis catheter, sequela: Secondary | ICD-10-CM | POA: Diagnosis not present

## 2019-09-16 DIAGNOSIS — N186 End stage renal disease: Secondary | ICD-10-CM | POA: Diagnosis not present

## 2019-09-16 DIAGNOSIS — D689 Coagulation defect, unspecified: Secondary | ICD-10-CM | POA: Diagnosis not present

## 2019-09-16 DIAGNOSIS — E119 Type 2 diabetes mellitus without complications: Secondary | ICD-10-CM | POA: Diagnosis not present

## 2019-09-16 DIAGNOSIS — Z992 Dependence on renal dialysis: Secondary | ICD-10-CM | POA: Diagnosis not present

## 2019-09-16 DIAGNOSIS — D509 Iron deficiency anemia, unspecified: Secondary | ICD-10-CM | POA: Diagnosis not present

## 2019-09-16 DIAGNOSIS — N2581 Secondary hyperparathyroidism of renal origin: Secondary | ICD-10-CM | POA: Diagnosis not present

## 2019-09-19 DIAGNOSIS — D509 Iron deficiency anemia, unspecified: Secondary | ICD-10-CM | POA: Diagnosis not present

## 2019-09-19 DIAGNOSIS — T8249XS Other complication of vascular dialysis catheter, sequela: Secondary | ICD-10-CM | POA: Diagnosis not present

## 2019-09-19 DIAGNOSIS — E119 Type 2 diabetes mellitus without complications: Secondary | ICD-10-CM | POA: Diagnosis not present

## 2019-09-19 DIAGNOSIS — D631 Anemia in chronic kidney disease: Secondary | ICD-10-CM | POA: Diagnosis not present

## 2019-09-19 DIAGNOSIS — N186 End stage renal disease: Secondary | ICD-10-CM | POA: Diagnosis not present

## 2019-09-19 DIAGNOSIS — Z992 Dependence on renal dialysis: Secondary | ICD-10-CM | POA: Diagnosis not present

## 2019-09-19 DIAGNOSIS — D689 Coagulation defect, unspecified: Secondary | ICD-10-CM | POA: Diagnosis not present

## 2019-09-19 DIAGNOSIS — N2581 Secondary hyperparathyroidism of renal origin: Secondary | ICD-10-CM | POA: Diagnosis not present

## 2019-09-20 DIAGNOSIS — Z9189 Other specified personal risk factors, not elsewhere classified: Secondary | ICD-10-CM | POA: Diagnosis not present

## 2019-09-20 DIAGNOSIS — L97512 Non-pressure chronic ulcer of other part of right foot with fat layer exposed: Secondary | ICD-10-CM | POA: Diagnosis not present

## 2019-09-20 DIAGNOSIS — Z89512 Acquired absence of left leg below knee: Secondary | ICD-10-CM | POA: Diagnosis not present

## 2019-09-21 DIAGNOSIS — T8249XS Other complication of vascular dialysis catheter, sequela: Secondary | ICD-10-CM | POA: Diagnosis not present

## 2019-09-21 DIAGNOSIS — N186 End stage renal disease: Secondary | ICD-10-CM | POA: Diagnosis not present

## 2019-09-21 DIAGNOSIS — Z992 Dependence on renal dialysis: Secondary | ICD-10-CM | POA: Diagnosis not present

## 2019-09-21 DIAGNOSIS — D631 Anemia in chronic kidney disease: Secondary | ICD-10-CM | POA: Diagnosis not present

## 2019-09-21 DIAGNOSIS — D509 Iron deficiency anemia, unspecified: Secondary | ICD-10-CM | POA: Diagnosis not present

## 2019-09-21 DIAGNOSIS — N2581 Secondary hyperparathyroidism of renal origin: Secondary | ICD-10-CM | POA: Diagnosis not present

## 2019-09-21 DIAGNOSIS — E119 Type 2 diabetes mellitus without complications: Secondary | ICD-10-CM | POA: Diagnosis not present

## 2019-09-21 DIAGNOSIS — D689 Coagulation defect, unspecified: Secondary | ICD-10-CM | POA: Diagnosis not present

## 2019-09-23 DIAGNOSIS — N2581 Secondary hyperparathyroidism of renal origin: Secondary | ICD-10-CM | POA: Diagnosis not present

## 2019-09-23 DIAGNOSIS — Z992 Dependence on renal dialysis: Secondary | ICD-10-CM | POA: Diagnosis not present

## 2019-09-23 DIAGNOSIS — T8249XS Other complication of vascular dialysis catheter, sequela: Secondary | ICD-10-CM | POA: Diagnosis not present

## 2019-09-23 DIAGNOSIS — D509 Iron deficiency anemia, unspecified: Secondary | ICD-10-CM | POA: Diagnosis not present

## 2019-09-23 DIAGNOSIS — E119 Type 2 diabetes mellitus without complications: Secondary | ICD-10-CM | POA: Diagnosis not present

## 2019-09-23 DIAGNOSIS — N186 End stage renal disease: Secondary | ICD-10-CM | POA: Diagnosis not present

## 2019-09-23 DIAGNOSIS — D631 Anemia in chronic kidney disease: Secondary | ICD-10-CM | POA: Diagnosis not present

## 2019-09-23 DIAGNOSIS — D689 Coagulation defect, unspecified: Secondary | ICD-10-CM | POA: Diagnosis not present

## 2019-09-26 ENCOUNTER — Other Ambulatory Visit: Payer: Self-pay

## 2019-09-26 ENCOUNTER — Encounter
Payer: Medicare Other | Attending: Physical Medicine and Rehabilitation | Admitting: Physical Medicine and Rehabilitation

## 2019-09-26 ENCOUNTER — Encounter: Payer: Self-pay | Admitting: Physical Medicine and Rehabilitation

## 2019-09-26 VITALS — Ht 68.0 in | Wt 166.0 lb

## 2019-09-26 DIAGNOSIS — N186 End stage renal disease: Secondary | ICD-10-CM

## 2019-09-26 DIAGNOSIS — Z992 Dependence on renal dialysis: Secondary | ICD-10-CM | POA: Diagnosis not present

## 2019-09-26 DIAGNOSIS — G546 Phantom limb syndrome with pain: Secondary | ICD-10-CM

## 2019-09-26 DIAGNOSIS — I2510 Atherosclerotic heart disease of native coronary artery without angina pectoris: Secondary | ICD-10-CM

## 2019-09-26 DIAGNOSIS — E119 Type 2 diabetes mellitus without complications: Secondary | ICD-10-CM | POA: Diagnosis not present

## 2019-09-26 DIAGNOSIS — E1122 Type 2 diabetes mellitus with diabetic chronic kidney disease: Secondary | ICD-10-CM

## 2019-09-26 DIAGNOSIS — D509 Iron deficiency anemia, unspecified: Secondary | ICD-10-CM | POA: Diagnosis not present

## 2019-09-26 DIAGNOSIS — Z95810 Presence of automatic (implantable) cardiac defibrillator: Secondary | ICD-10-CM | POA: Diagnosis not present

## 2019-09-26 DIAGNOSIS — T8249XS Other complication of vascular dialysis catheter, sequela: Secondary | ICD-10-CM | POA: Diagnosis not present

## 2019-09-26 DIAGNOSIS — I959 Hypotension, unspecified: Secondary | ICD-10-CM

## 2019-09-26 DIAGNOSIS — Z89512 Acquired absence of left leg below knee: Secondary | ICD-10-CM

## 2019-09-26 DIAGNOSIS — D689 Coagulation defect, unspecified: Secondary | ICD-10-CM | POA: Diagnosis not present

## 2019-09-26 DIAGNOSIS — E11628 Type 2 diabetes mellitus with other skin complications: Secondary | ICD-10-CM

## 2019-09-26 DIAGNOSIS — D631 Anemia in chronic kidney disease: Secondary | ICD-10-CM | POA: Diagnosis not present

## 2019-09-26 DIAGNOSIS — N2581 Secondary hyperparathyroidism of renal origin: Secondary | ICD-10-CM | POA: Diagnosis not present

## 2019-09-26 NOTE — Patient Instructions (Addendum)
.   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  1. Will send Rx to patient for L BKA prosthesis fitting as well as stump shrinker/shaper to shape L BKA for fitting into prosthesis and socks as needed.   2. Doesn't c/o pain, so no need for refills on meds at this time.   3. D/c'd from vascular surgery  4. RLE 5th MTP wound is healing, per pt- hasn't had any amputation, per pt, regarding RLE.  5. F/U in 3 months to see AFTER gets prosthesis and see how it's going for him.

## 2019-09-26 NOTE — Progress Notes (Signed)
Subjective:    Patient ID: Timothy Faulkner, male    DOB: 09-Feb-1946, 74 y.o.   MRN: [id number]  Due to national recommendations of social distancing because of COVID 40, an audio/video tele-health visit is felt to be the most appropriate encounter for this patient at this time. See MyChart message from today for the patient's consent to a tele-health encounter with [place]. This is a follow up tele-visit via Webex. The patient is at home. MD is at office.   HPI . 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  Wife does most of talking due to pt having Hemodialysis this AM- is tired.   R foot is getting better.  L BKA almost healed.   1 little crusty spot- on L BKA- no drainage.    Pain doing well.  No issues with pain.    Wants to get a prosthesis for L BKA- hasn't worked with any particular company.     Pain Inventory Average Pain 7 Pain Right Now 7 My pain is constant, sharp, burning, dull, stabbing, tingling and aching  In the last 24 hours, has pain interfered with the following? General activity 8 Relation with others 8 Enjoyment of life 5 What TIME of day is your pain at its worst? varies with activity Sleep (in general) Poor  Pain is worse with: walking, sitting, standing and some activites Pain improves with: rest and medication Relief from Meds: 5  Mobility use a wheelchair needs help with transfers  Function retired  Neuro/Psych bladder control problems weakness numbness tremor tingling trouble walking spasms confusion  Prior Studies Any changes since last visit?  no  Physicians involved in your care Any changes since last visit?  no   Family History  Problem Relation Age of Onset  . Heart disease Father   . CAD Father    Social History   Socioeconomic History  . Marital status:  Married    Spouse name: [name]  . Number of children: 2  . Years of education: 37  . Highest education level: Not on file  Occupational History  . Occupation: diasbled  Tobacco Use  . Smoking status: Former Smoker    Packs/day: 0.75    Years: 30.00    Pack years: 22.50    Types: Cigarettes    Quit date: 11/25/1992    Years since quitting: 26.8  . Smokeless tobacco: Never Used  Substance and Sexual Activity  . Alcohol use: Not Currently  . Drug use: Not Currently    Types: Marijuana    Comment: last use 10 years ago  . Sexual activity: Yes  Other Topics Concern  . 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 Difficulty of Paying Living Expenses: Not on file  Food Insecurity:   . Worried About Charity fundraiser in the Last Year: Not on file  . Ran Out of Food in the Last Year: Not on file  Transportation Needs:   . Lack of Transportation (Medical): Not on file  . Lack of Transportation (Non-Medical): Not on  file  Physical Activity:   . Days of Exercise per Week: Not on file  . Minutes of Exercise per Session: Not on file  Stress:   . Feeling of Stress : Not on file  Social Connections:   . Frequency of Communication with Friends and Family: Not on file  . Frequency of Social Gatherings with Friends and Family: Not on file  . Attends Religious Services: Not on file  . Active Member of Clubs or Organizations: Not on file  . Attends Archivist Meetings: Not on file  . Marital Status: Not on file   Past Surgical History:  Procedure Laterality Date  . A/V FISTULAGRAM Left 07/20/2018   Procedure: A/V FISTULAGRAM;  Surgeon: [name], MD;  Location: [place] [facility];  Service: Cardiovascular;  Laterality: Left;  . A/V FISTULAGRAM N/A 07/15/2019    Procedure: A/V FISTULAGRAM - Left Arm;  Surgeon: Elam Dutch, MD;  Location: [place] [facility];  Service: Cardiovascular;  Laterality: N/A;  . ABDOMINAL AORTOGRAM N/A 02/25/2019   Procedure: ABDOMINAL AORTOGRAM;  Surgeon: Elam Dutch, MD;  Location: [place] [facility];  Service: Cardiovascular;  Laterality: N/A;  . ABDOMINAL AORTOGRAM W/LOWER EXTREMITY Bilateral 07/20/2018   Procedure: ABDOMINAL AORTOGRAM W/LOWER EXTREMITY;  Surgeon: [name], MD;  Location: [place] [facility];  Service: Cardiovascular;  Laterality: Bilateral;  . AMPUTATION Right 09/07/2018   Procedure: Right fifth metatarsectomy;  Surgeon: [name], DPM;  Location: [place];  Service: Podiatry;  Laterality: Right;  . AMPUTATION Left 03/08/2019   Procedure: AMPUTATION  3RD AND 4TH TOES LEFT FOOT;  Surgeon: [name], DPM;  Location: [place];  Service: Podiatry;  Laterality: Left;  . AMPUTATION Left 03/15/2019   Procedure: AMPUTATION BELOW KNEE;  Surgeon: Elam Dutch, MD;  Location: [facility] OR;  Service: Vascular;  Laterality: Left;  . AV FISTULA PLACEMENT Right 12/13/2012   Procedure: ARTERIOVENOUS (AV) FISTULA CREATION;  Surgeon: [name], MD;  Location: [place];  Service: Vascular;  Laterality: Right;  Ultrasound guided  . AV FISTULA PLACEMENT Left 05/07/2016   Procedure: LEFT RADIOCEPHALIC ARTERIOVENOUS (AV) FISTULA CREATION;  Surgeon: [name], MD;  Location[facility];  Service: Vascular;  Laterality: Left;  . AV FISTULA PLACEMENT Right 08/09/2019   Procedure: INSERTION OF RIGHT ARTERIOVENOUS (AV) 4-33mm GORE-TEX STRETCH GRAFT RIGHT  ARM;  Surgeon: Elam Dutch, MD;  Location: [place];  Service: Vascular;  Laterality: Right;  . BASCILIC VEIN TRANSPOSITION Right 03/26/2016   Procedure: RIGHT BASILIC VEIN TRANSPOSITION;  Surgeon: [name], MD;  Location: [place];  Service: Vascular;  Laterality: Right;  . CARDIAC CATHETERIZATION    . CARDIAC DEFIBRILLATOR PLACEMENT     Chemical engineer  . EYE  SURGERY Bilateral    Cataract  . FISTULOGRAM Left 04/22/2018   Procedure: FISTULOGRAM UPPER EXTREMITY;  Surgeon: [name], MD;  Location: [place];  Service: Vascular;  Laterality: Left;  . I & D EXTREMITY Right 07/15/2018   Procedure: IRRIGATION AND DEBRIDEMENT RIGHT FOOT;  Surgeon: [name], DPM;  Location: [place];  Service: Podiatry;  Laterality: Right;  . INCISION AND DRAINAGE ABSCESS / HEMATOMA OF BURSA / KNEE / THIGH Left 2012   "knee" (03/11/2013)  . INSERT / REPLACE / REMOVE PACEMAKER    . INSERTION OF DIALYSIS CATHETER Left 04/22/2018   Procedure: INSERTION OF DIALYSIS CATHETER;  Surgeon: [name], MD;  Location: [place];  Service: Vascular;  Laterality:  Left;  . IR FLUORO GUIDE CV LINE LEFT  07/15/2018  . IR PTA VENOUS EXCEPT DIALYSIS CIRCUIT  07/15/2018  . LOWER EXTREMITY ANGIOGRAPHY Right 07/21/2018   Procedure: LOWER EXTREMITY ANGIOGRAPHY;  Surgeon: [name], MD;  Location: [place] [facility];  Service: Cardiovascular;  Laterality: Right;  . LOWER EXTREMITY ANGIOGRAPHY Bilateral 02/25/2019   Procedure: Lower Extremity Angiography;  Surgeon: Elam Dutch, MD;  Location: [place] [facility];  Service: Cardiovascular;  Laterality: Bilateral;  . METATARSAL HEAD EXCISION Right 07/15/2018   Procedure: METATARSAL RESECTION;  Surgeon: [name], DPM;  Location: [place];  Service: Podiatry;  Laterality: Right;  . MULTIPLE TOOTH EXTRACTIONS    . PERIPHERAL VASCULAR ATHERECTOMY Right 02/28/2019   Procedure: PERIPHERAL VASCULAR ATHERECTOMY;  Surgeon: Waynetta Sandy, MD;  Location: [place] [facility];  Service: Cardiovascular;  Laterality: Right;  right tp trunk   . PERIPHERAL VASCULAR BALLOON ANGIOPLASTY Left 02/25/2019   Procedure: PERIPHERAL VASCULAR BALLOON ANGIOPLASTY;  Surgeon: Elam Dutch, MD;  Location: [place] [facility];  Service: Cardiovascular;  Laterality: Left;  tibial peroneal trunk and PT  . PERIPHERAL VASCULAR INTERVENTION  Right 07/21/2018   Procedure: PERIPHERAL VASCULAR INTERVENTION;  Surgeon: [name], MD;  Location: [place] [facility];  Service: Cardiovascular;  Laterality: Right;  peroneal stents  . REVISON OF ARTERIOVENOUS FISTULA Right 12/19/2583   Procedure: Plication OF Right Arm ARTERIOVENOUS FISTULA;  Surgeon: Elam Dutch, MD;  Location: [facility] OR;  Service: Vascular;  Laterality: Right;  . REVISON OF ARTERIOVENOUS FISTULA Left 04/22/2018   Procedure: REVISION OF RADIOCEPHALIC ARTERIOVENOUS FISTULA;  Surgeon: [name], MD;  Location: [place];  Service: Vascular;  Laterality: Left;  . SHUNTOGRAM N/A 05/31/2013   Procedure: Earney Mallet;  Surgeon: [name], MD;  Location: [facility] LAB;  Service: Cardiovascular;  Laterality: N/A;  . UPPER EXTREMITY VENOGRAPHY Right 07/23/2018   Procedure: UPPER EXTREMITY VENOGRAPHY;  Surgeon: [name], MD;  Location: [place] [facility];  Service: Cardiovascular;  Laterality: Right;  . UPPER EXTREMITY VENOGRAPHY  07/15/2019   Procedure: UPPER EXTREMITY VENOGRAPHY;  Surgeon: Elam Dutch, MD;  Location: [place] [facility];  Service: Cardiovascular;;  rt arm   . WOUND DEBRIDEMENT Right 07/17/2018   Procedure: Wound Debridement; Closure Filleted toe flap Right Foot;  Surgeon: [name], DPM;  Location: [place];  Service: Podiatry;  Laterality: Right;  . WOUND DEBRIDEMENT Right 09/07/2018   Procedure: Debridement Right Foot Wound, application of wound vac;  Surgeon: [name], DPM;  Location: [place];  Service: Podiatry;  Laterality: Right;  . WOUND DEBRIDEMENT Right 03/08/2019   Procedure: DEBRIDEMENT WOUND RIGHT FOOT;  Surgeon: [name], DPM;  Location: [place];  Service: Podiatry;  Laterality: Right;   Past Medical History:  Diagnosis Date  . Anemia   . Arthritis    Gout  . Automatic implantable cardioverter-defibrillator in situ    [employer]  . CHF   . ESRD on dialysis [facility])    [name] 03/11/2013 (03/11/2013)  dialysis M/W/F  . Gangrene ([place])    left foot  . GERD (gastroesophageal reflux disease)   . Gout    "once a year"  . Heart murmur   . HYPERCHOLESTEROLEMIA, MIXED   . HYPERTENSION   . Macular degeneration   . Osteomyelitis ([place])    right foot  . 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 Pacemaker   . Peripheral arterial disease ([place])    left  fifth toe ulcer, healing  . Pneumonia   . Shortness of breath   . 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 Wears dentures   . Wears glasses    Ht 5\' 8"  (1.727 m)   Wt 166 lb (75.3 kg)   BMI 25.24 kg/m   Opioid Risk Score:   Fall Risk Score:  `1  Depression screen PHQ 2/9  Depression screen PHQ 2/9 09/01/2018  Decreased Interest 0  Down, Depressed, Hopeless 0  PHQ - 2 Score 0  Some recent data might be hidden    Review of Systems  Constitutional: Positive for fatigue.  HENT: Negative.   Eyes: Negative.   Respiratory: Negative.   Cardiovascular: Negative.   Gastrointestinal: Positive for constipation.  Endocrine: Negative.   Genitourinary:       Dialysis  Musculoskeletal: Positive for arthralgias.  Skin: Positive for wound.  Neurological: Positive for tremors, weakness and numbness.       Tingling  Psychiatric/Behavioral: Positive for dysphoric mood. The patient is nervous/anxious.   All other systems reviewed and are negative.      Objective:   Physical Exam    Phone call- pt said too tired to do web ex- due to Hemodialysis this AM.    Assessment & Plan:    . 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  1. Will send Rx to patient for L BKA prosthesis fitting as well as stump shrinker/shaper to shape L BKA for fitting into prosthesis and socks as needed.   2. Doesn't c/o pain, so no need for refills on meds at this time.   3. D/c'd from vascular  surgery  4. RLE 5th MTP wound is healing, per pt- hasn't had any amputation, per pt, regarding RLE.  5. F/U in 3 months to see AFTER gets prosthesis and see how it's going for him.   I spent 20 minutes on total appointment; more than 10 minutes discussing with pt/daughter/wife about plans for prosthesis and process.

## 2019-09-28 DIAGNOSIS — N186 End stage renal disease: Secondary | ICD-10-CM | POA: Diagnosis not present

## 2019-09-28 DIAGNOSIS — Z992 Dependence on renal dialysis: Secondary | ICD-10-CM | POA: Diagnosis not present

## 2019-09-28 DIAGNOSIS — D509 Iron deficiency anemia, unspecified: Secondary | ICD-10-CM | POA: Diagnosis not present

## 2019-09-28 DIAGNOSIS — T8249XS Other complication of vascular dialysis catheter, sequela: Secondary | ICD-10-CM | POA: Diagnosis not present

## 2019-09-28 DIAGNOSIS — D631 Anemia in chronic kidney disease: Secondary | ICD-10-CM | POA: Diagnosis not present

## 2019-09-28 DIAGNOSIS — D689 Coagulation defect, unspecified: Secondary | ICD-10-CM | POA: Diagnosis not present

## 2019-09-28 DIAGNOSIS — E119 Type 2 diabetes mellitus without complications: Secondary | ICD-10-CM | POA: Diagnosis not present

## 2019-09-28 DIAGNOSIS — N2581 Secondary hyperparathyroidism of renal origin: Secondary | ICD-10-CM | POA: Diagnosis not present

## 2019-09-30 DIAGNOSIS — D509 Iron deficiency anemia, unspecified: Secondary | ICD-10-CM | POA: Diagnosis not present

## 2019-09-30 DIAGNOSIS — N186 End stage renal disease: Secondary | ICD-10-CM | POA: Diagnosis not present

## 2019-09-30 DIAGNOSIS — T8249XS Other complication of vascular dialysis catheter, sequela: Secondary | ICD-10-CM | POA: Diagnosis not present

## 2019-09-30 DIAGNOSIS — Z992 Dependence on renal dialysis: Secondary | ICD-10-CM | POA: Diagnosis not present

## 2019-09-30 DIAGNOSIS — N2581 Secondary hyperparathyroidism of renal origin: Secondary | ICD-10-CM | POA: Diagnosis not present

## 2019-09-30 DIAGNOSIS — E119 Type 2 diabetes mellitus without complications: Secondary | ICD-10-CM | POA: Diagnosis not present

## 2019-09-30 DIAGNOSIS — D631 Anemia in chronic kidney disease: Secondary | ICD-10-CM | POA: Diagnosis not present

## 2019-09-30 DIAGNOSIS — D689 Coagulation defect, unspecified: Secondary | ICD-10-CM | POA: Diagnosis not present

## 2019-10-03 DIAGNOSIS — N186 End stage renal disease: Secondary | ICD-10-CM | POA: Diagnosis not present

## 2019-10-03 DIAGNOSIS — T8249XS Other complication of vascular dialysis catheter, sequela: Secondary | ICD-10-CM | POA: Diagnosis not present

## 2019-10-03 DIAGNOSIS — Z992 Dependence on renal dialysis: Secondary | ICD-10-CM | POA: Diagnosis not present

## 2019-10-03 DIAGNOSIS — D689 Coagulation defect, unspecified: Secondary | ICD-10-CM | POA: Diagnosis not present

## 2019-10-03 DIAGNOSIS — D631 Anemia in chronic kidney disease: Secondary | ICD-10-CM | POA: Diagnosis not present

## 2019-10-03 DIAGNOSIS — N2581 Secondary hyperparathyroidism of renal origin: Secondary | ICD-10-CM | POA: Diagnosis not present

## 2019-10-03 DIAGNOSIS — D509 Iron deficiency anemia, unspecified: Secondary | ICD-10-CM | POA: Diagnosis not present

## 2019-10-03 DIAGNOSIS — E119 Type 2 diabetes mellitus without complications: Secondary | ICD-10-CM | POA: Diagnosis not present

## 2019-10-05 DIAGNOSIS — Z992 Dependence on renal dialysis: Secondary | ICD-10-CM | POA: Diagnosis not present

## 2019-10-05 DIAGNOSIS — D689 Coagulation defect, unspecified: Secondary | ICD-10-CM | POA: Diagnosis not present

## 2019-10-05 DIAGNOSIS — D509 Iron deficiency anemia, unspecified: Secondary | ICD-10-CM | POA: Diagnosis not present

## 2019-10-05 DIAGNOSIS — E119 Type 2 diabetes mellitus without complications: Secondary | ICD-10-CM | POA: Diagnosis not present

## 2019-10-05 DIAGNOSIS — N186 End stage renal disease: Secondary | ICD-10-CM | POA: Diagnosis not present

## 2019-10-05 DIAGNOSIS — T8249XS Other complication of vascular dialysis catheter, sequela: Secondary | ICD-10-CM | POA: Diagnosis not present

## 2019-10-05 DIAGNOSIS — N2581 Secondary hyperparathyroidism of renal origin: Secondary | ICD-10-CM | POA: Diagnosis not present

## 2019-10-05 DIAGNOSIS — D631 Anemia in chronic kidney disease: Secondary | ICD-10-CM | POA: Diagnosis not present

## 2019-10-07 DIAGNOSIS — D509 Iron deficiency anemia, unspecified: Secondary | ICD-10-CM | POA: Diagnosis not present

## 2019-10-07 DIAGNOSIS — T8249XS Other complication of vascular dialysis catheter, sequela: Secondary | ICD-10-CM | POA: Diagnosis not present

## 2019-10-07 DIAGNOSIS — E119 Type 2 diabetes mellitus without complications: Secondary | ICD-10-CM | POA: Diagnosis not present

## 2019-10-07 DIAGNOSIS — Z992 Dependence on renal dialysis: Secondary | ICD-10-CM | POA: Diagnosis not present

## 2019-10-07 DIAGNOSIS — N2581 Secondary hyperparathyroidism of renal origin: Secondary | ICD-10-CM | POA: Diagnosis not present

## 2019-10-07 DIAGNOSIS — N186 End stage renal disease: Secondary | ICD-10-CM | POA: Diagnosis not present

## 2019-10-07 DIAGNOSIS — D631 Anemia in chronic kidney disease: Secondary | ICD-10-CM | POA: Diagnosis not present

## 2019-10-07 DIAGNOSIS — D689 Coagulation defect, unspecified: Secondary | ICD-10-CM | POA: Diagnosis not present

## 2019-10-09 DIAGNOSIS — Z992 Dependence on renal dialysis: Secondary | ICD-10-CM | POA: Diagnosis not present

## 2019-10-09 DIAGNOSIS — N186 End stage renal disease: Secondary | ICD-10-CM | POA: Diagnosis not present

## 2019-10-09 DIAGNOSIS — I12 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 chronic kidney disease or end stage renal disease: Secondary | ICD-10-CM | POA: Diagnosis not present

## 2019-10-10 DIAGNOSIS — N2581 Secondary hyperparathyroidism of renal origin: Secondary | ICD-10-CM | POA: Diagnosis not present

## 2019-10-10 DIAGNOSIS — E119 Type 2 diabetes mellitus without complications: Secondary | ICD-10-CM | POA: Diagnosis not present

## 2019-10-10 DIAGNOSIS — Z992 Dependence on renal dialysis: Secondary | ICD-10-CM | POA: Diagnosis not present

## 2019-10-10 DIAGNOSIS — D689 Coagulation defect, unspecified: Secondary | ICD-10-CM | POA: Diagnosis not present

## 2019-10-10 DIAGNOSIS — D509 Iron deficiency anemia, unspecified: Secondary | ICD-10-CM | POA: Diagnosis not present

## 2019-10-10 DIAGNOSIS — T8249XS Other complication of vascular dialysis catheter, sequela: Secondary | ICD-10-CM | POA: Diagnosis not present

## 2019-10-10 DIAGNOSIS — N186 End stage renal disease: Secondary | ICD-10-CM | POA: Diagnosis not present

## 2019-10-12 DIAGNOSIS — D509 Iron deficiency anemia, unspecified: Secondary | ICD-10-CM | POA: Diagnosis not present

## 2019-10-12 DIAGNOSIS — D689 Coagulation defect, unspecified: Secondary | ICD-10-CM | POA: Diagnosis not present

## 2019-10-12 DIAGNOSIS — Z992 Dependence on renal dialysis: Secondary | ICD-10-CM | POA: Diagnosis not present

## 2019-10-12 DIAGNOSIS — N186 End stage renal disease: Secondary | ICD-10-CM | POA: Diagnosis not present

## 2019-10-12 DIAGNOSIS — N2581 Secondary hyperparathyroidism of renal origin: Secondary | ICD-10-CM | POA: Diagnosis not present

## 2019-10-12 DIAGNOSIS — E119 Type 2 diabetes mellitus without complications: Secondary | ICD-10-CM | POA: Diagnosis not present

## 2019-10-12 DIAGNOSIS — T8249XS Other complication of vascular dialysis catheter, sequela: Secondary | ICD-10-CM | POA: Diagnosis not present

## 2019-10-14 DIAGNOSIS — D689 Coagulation defect, unspecified: Secondary | ICD-10-CM | POA: Diagnosis not present

## 2019-10-14 DIAGNOSIS — T8249XS Other complication of vascular dialysis catheter, sequela: Secondary | ICD-10-CM | POA: Diagnosis not present

## 2019-10-14 DIAGNOSIS — D509 Iron deficiency anemia, unspecified: Secondary | ICD-10-CM | POA: Diagnosis not present

## 2019-10-14 DIAGNOSIS — N186 End stage renal disease: Secondary | ICD-10-CM | POA: Diagnosis not present

## 2019-10-14 DIAGNOSIS — E119 Type 2 diabetes mellitus without complications: Secondary | ICD-10-CM | POA: Diagnosis not present

## 2019-10-14 DIAGNOSIS — Z992 Dependence on renal dialysis: Secondary | ICD-10-CM | POA: Diagnosis not present

## 2019-10-14 DIAGNOSIS — N2581 Secondary hyperparathyroidism of renal origin: Secondary | ICD-10-CM | POA: Diagnosis not present

## 2019-10-17 DIAGNOSIS — E119 Type 2 diabetes mellitus without complications: Secondary | ICD-10-CM | POA: Diagnosis not present

## 2019-10-17 DIAGNOSIS — Z992 Dependence on renal dialysis: Secondary | ICD-10-CM | POA: Diagnosis not present

## 2019-10-17 DIAGNOSIS — T8249XS Other complication of vascular dialysis catheter, sequela: Secondary | ICD-10-CM | POA: Diagnosis not present

## 2019-10-17 DIAGNOSIS — D509 Iron deficiency anemia, unspecified: Secondary | ICD-10-CM | POA: Diagnosis not present

## 2019-10-17 DIAGNOSIS — D689 Coagulation defect, unspecified: Secondary | ICD-10-CM | POA: Diagnosis not present

## 2019-10-17 DIAGNOSIS — N186 End stage renal disease: Secondary | ICD-10-CM | POA: Diagnosis not present

## 2019-10-17 DIAGNOSIS — N2581 Secondary hyperparathyroidism of renal origin: Secondary | ICD-10-CM | POA: Diagnosis not present

## 2019-10-18 DIAGNOSIS — Z9189 Other specified personal risk factors, not elsewhere classified: Secondary | ICD-10-CM | POA: Diagnosis not present

## 2019-10-18 DIAGNOSIS — Z89512 Acquired absence of left leg below knee: Secondary | ICD-10-CM | POA: Diagnosis not present

## 2019-10-18 DIAGNOSIS — Z452 Encounter for adjustment and management of vascular access device: Secondary | ICD-10-CM | POA: Diagnosis not present

## 2019-10-18 DIAGNOSIS — L97512 Non-pressure chronic ulcer of other part of right foot with fat layer exposed: Secondary | ICD-10-CM | POA: Diagnosis not present

## 2019-10-19 DIAGNOSIS — D509 Iron deficiency anemia, unspecified: Secondary | ICD-10-CM | POA: Diagnosis not present

## 2019-10-19 DIAGNOSIS — D689 Coagulation defect, unspecified: Secondary | ICD-10-CM | POA: Diagnosis not present

## 2019-10-19 DIAGNOSIS — N186 End stage renal disease: Secondary | ICD-10-CM | POA: Diagnosis not present

## 2019-10-19 DIAGNOSIS — T8249XS Other complication of vascular dialysis catheter, sequela: Secondary | ICD-10-CM | POA: Diagnosis not present

## 2019-10-19 DIAGNOSIS — N2581 Secondary hyperparathyroidism of renal origin: Secondary | ICD-10-CM | POA: Diagnosis not present

## 2019-10-19 DIAGNOSIS — Z992 Dependence on renal dialysis: Secondary | ICD-10-CM | POA: Diagnosis not present

## 2019-10-19 DIAGNOSIS — E119 Type 2 diabetes mellitus without complications: Secondary | ICD-10-CM | POA: Diagnosis not present

## 2019-10-21 DIAGNOSIS — Z992 Dependence on renal dialysis: Secondary | ICD-10-CM | POA: Diagnosis not present

## 2019-10-21 DIAGNOSIS — E119 Type 2 diabetes mellitus without complications: Secondary | ICD-10-CM | POA: Diagnosis not present

## 2019-10-21 DIAGNOSIS — D509 Iron deficiency anemia, unspecified: Secondary | ICD-10-CM | POA: Diagnosis not present

## 2019-10-21 DIAGNOSIS — D689 Coagulation defect, unspecified: Secondary | ICD-10-CM | POA: Diagnosis not present

## 2019-10-21 DIAGNOSIS — N2581 Secondary hyperparathyroidism of renal origin: Secondary | ICD-10-CM | POA: Diagnosis not present

## 2019-10-21 DIAGNOSIS — N186 End stage renal disease: Secondary | ICD-10-CM | POA: Diagnosis not present

## 2019-10-21 DIAGNOSIS — T8249XS Other complication of vascular dialysis catheter, sequela: Secondary | ICD-10-CM | POA: Diagnosis not present

## 2019-10-24 ENCOUNTER — Ambulatory Visit (INDEPENDENT_AMBULATORY_CARE_PROVIDER_SITE_OTHER): Payer: Medicare Other | Admitting: *Deleted

## 2019-10-24 DIAGNOSIS — N186 End stage renal disease: Secondary | ICD-10-CM | POA: Diagnosis not present

## 2019-10-24 DIAGNOSIS — Z992 Dependence on renal dialysis: Secondary | ICD-10-CM | POA: Diagnosis not present

## 2019-10-24 DIAGNOSIS — E119 Type 2 diabetes mellitus without complications: Secondary | ICD-10-CM | POA: Diagnosis not present

## 2019-10-24 DIAGNOSIS — N2581 Secondary hyperparathyroidism of renal origin: Secondary | ICD-10-CM | POA: Diagnosis not present

## 2019-10-24 DIAGNOSIS — Z95810 Presence of automatic (implantable) cardiac defibrillator: Secondary | ICD-10-CM

## 2019-10-24 DIAGNOSIS — D509 Iron deficiency anemia, unspecified: Secondary | ICD-10-CM | POA: Diagnosis not present

## 2019-10-24 DIAGNOSIS — T8249XS Other complication of vascular dialysis catheter, sequela: Secondary | ICD-10-CM | POA: Diagnosis not present

## 2019-10-24 DIAGNOSIS — D689 Coagulation defect, unspecified: Secondary | ICD-10-CM | POA: Diagnosis not present

## 2019-10-24 LAB — CUP PACEART REMOTE DEVICE CHECK
Battery Remaining Longevity: 48 mo
Battery Remaining Percentage: 53 %
Brady Statistic RV Percent Paced: 0 %
Date Time Interrogation Session: 20210315013200
HighPow Impedance: 94 Ohm
Implantable Lead Implant Date: 20111118
Implantable Lead Location: 753860
Implantable Lead Model: 181
Implantable Lead Serial Number: 313758
Implantable Pulse Generator Implant Date: 20111118
Lead Channel Impedance Value: 550 Ohm
Lead Channel Pacing Threshold Amplitude: 0.7 V
Lead Channel Pacing Threshold Pulse Width: 0.5 ms
Lead Channel Setting Pacing Amplitude: 2.4 V
Lead Channel Setting Pacing Pulse Width: 0.5 ms
Lead Channel Setting Sensing Sensitivity: 0.5 mV
Pulse Gen Serial Number: 268731

## 2019-10-24 NOTE — Progress Notes (Signed)
ICD Remote  

## 2019-10-26 DIAGNOSIS — N2581 Secondary hyperparathyroidism of renal origin: Secondary | ICD-10-CM | POA: Diagnosis not present

## 2019-10-26 DIAGNOSIS — N186 End stage renal disease: Secondary | ICD-10-CM | POA: Diagnosis not present

## 2019-10-26 DIAGNOSIS — T8249XS Other complication of vascular dialysis catheter, sequela: Secondary | ICD-10-CM | POA: Diagnosis not present

## 2019-10-26 DIAGNOSIS — Z992 Dependence on renal dialysis: Secondary | ICD-10-CM | POA: Diagnosis not present

## 2019-10-26 DIAGNOSIS — D689 Coagulation defect, unspecified: Secondary | ICD-10-CM | POA: Diagnosis not present

## 2019-10-26 DIAGNOSIS — D509 Iron deficiency anemia, unspecified: Secondary | ICD-10-CM | POA: Diagnosis not present

## 2019-10-26 DIAGNOSIS — E119 Type 2 diabetes mellitus without complications: Secondary | ICD-10-CM | POA: Diagnosis not present

## 2019-10-27 ENCOUNTER — Ambulatory Visit: Payer: Medicare Other | Admitting: Podiatry

## 2019-10-27 ENCOUNTER — Other Ambulatory Visit: Payer: Medicare Other | Admitting: Orthotics

## 2019-10-28 DIAGNOSIS — Z992 Dependence on renal dialysis: Secondary | ICD-10-CM | POA: Diagnosis not present

## 2019-10-28 DIAGNOSIS — T8249XS Other complication of vascular dialysis catheter, sequela: Secondary | ICD-10-CM | POA: Diagnosis not present

## 2019-10-28 DIAGNOSIS — E119 Type 2 diabetes mellitus without complications: Secondary | ICD-10-CM | POA: Diagnosis not present

## 2019-10-28 DIAGNOSIS — D509 Iron deficiency anemia, unspecified: Secondary | ICD-10-CM | POA: Diagnosis not present

## 2019-10-28 DIAGNOSIS — N2581 Secondary hyperparathyroidism of renal origin: Secondary | ICD-10-CM | POA: Diagnosis not present

## 2019-10-28 DIAGNOSIS — N186 End stage renal disease: Secondary | ICD-10-CM | POA: Diagnosis not present

## 2019-10-28 DIAGNOSIS — D689 Coagulation defect, unspecified: Secondary | ICD-10-CM | POA: Diagnosis not present

## 2019-10-31 DIAGNOSIS — N186 End stage renal disease: Secondary | ICD-10-CM | POA: Diagnosis not present

## 2019-10-31 DIAGNOSIS — T8249XS Other complication of vascular dialysis catheter, sequela: Secondary | ICD-10-CM | POA: Diagnosis not present

## 2019-10-31 DIAGNOSIS — D509 Iron deficiency anemia, unspecified: Secondary | ICD-10-CM | POA: Diagnosis not present

## 2019-10-31 DIAGNOSIS — E119 Type 2 diabetes mellitus without complications: Secondary | ICD-10-CM | POA: Diagnosis not present

## 2019-10-31 DIAGNOSIS — Z992 Dependence on renal dialysis: Secondary | ICD-10-CM | POA: Diagnosis not present

## 2019-10-31 DIAGNOSIS — N2581 Secondary hyperparathyroidism of renal origin: Secondary | ICD-10-CM | POA: Diagnosis not present

## 2019-10-31 DIAGNOSIS — D689 Coagulation defect, unspecified: Secondary | ICD-10-CM | POA: Diagnosis not present

## 2019-11-02 DIAGNOSIS — T8249XS Other complication of vascular dialysis catheter, sequela: Secondary | ICD-10-CM | POA: Diagnosis not present

## 2019-11-02 DIAGNOSIS — Z992 Dependence on renal dialysis: Secondary | ICD-10-CM | POA: Diagnosis not present

## 2019-11-02 DIAGNOSIS — D689 Coagulation defect, unspecified: Secondary | ICD-10-CM | POA: Diagnosis not present

## 2019-11-02 DIAGNOSIS — N186 End stage renal disease: Secondary | ICD-10-CM | POA: Diagnosis not present

## 2019-11-02 DIAGNOSIS — N2581 Secondary hyperparathyroidism of renal origin: Secondary | ICD-10-CM | POA: Diagnosis not present

## 2019-11-02 DIAGNOSIS — E119 Type 2 diabetes mellitus without complications: Secondary | ICD-10-CM | POA: Diagnosis not present

## 2019-11-02 DIAGNOSIS — D509 Iron deficiency anemia, unspecified: Secondary | ICD-10-CM | POA: Diagnosis not present

## 2019-11-04 DIAGNOSIS — E119 Type 2 diabetes mellitus without complications: Secondary | ICD-10-CM | POA: Diagnosis not present

## 2019-11-04 DIAGNOSIS — D509 Iron deficiency anemia, unspecified: Secondary | ICD-10-CM | POA: Diagnosis not present

## 2019-11-04 DIAGNOSIS — Z992 Dependence on renal dialysis: Secondary | ICD-10-CM | POA: Diagnosis not present

## 2019-11-04 DIAGNOSIS — T8249XS Other complication of vascular dialysis catheter, sequela: Secondary | ICD-10-CM | POA: Diagnosis not present

## 2019-11-04 DIAGNOSIS — D689 Coagulation defect, unspecified: Secondary | ICD-10-CM | POA: Diagnosis not present

## 2019-11-04 DIAGNOSIS — N2581 Secondary hyperparathyroidism of renal origin: Secondary | ICD-10-CM | POA: Diagnosis not present

## 2019-11-04 DIAGNOSIS — N186 End stage renal disease: Secondary | ICD-10-CM | POA: Diagnosis not present

## 2019-11-07 DIAGNOSIS — D689 Coagulation defect, unspecified: Secondary | ICD-10-CM | POA: Diagnosis not present

## 2019-11-07 DIAGNOSIS — Z992 Dependence on renal dialysis: Secondary | ICD-10-CM | POA: Diagnosis not present

## 2019-11-07 DIAGNOSIS — E119 Type 2 diabetes mellitus without complications: Secondary | ICD-10-CM | POA: Diagnosis not present

## 2019-11-07 DIAGNOSIS — T8249XS Other complication of vascular dialysis catheter, sequela: Secondary | ICD-10-CM | POA: Diagnosis not present

## 2019-11-07 DIAGNOSIS — N2581 Secondary hyperparathyroidism of renal origin: Secondary | ICD-10-CM | POA: Diagnosis not present

## 2019-11-07 DIAGNOSIS — N186 End stage renal disease: Secondary | ICD-10-CM | POA: Diagnosis not present

## 2019-11-07 DIAGNOSIS — D509 Iron deficiency anemia, unspecified: Secondary | ICD-10-CM | POA: Diagnosis not present

## 2019-11-09 DIAGNOSIS — I12 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 chronic kidney disease or end stage renal disease: Secondary | ICD-10-CM | POA: Diagnosis not present

## 2019-11-09 DIAGNOSIS — D689 Coagulation defect, unspecified: Secondary | ICD-10-CM | POA: Diagnosis not present

## 2019-11-09 DIAGNOSIS — D509 Iron deficiency anemia, unspecified: Secondary | ICD-10-CM | POA: Diagnosis not present

## 2019-11-09 DIAGNOSIS — Z992 Dependence on renal dialysis: Secondary | ICD-10-CM | POA: Diagnosis not present

## 2019-11-09 DIAGNOSIS — E119 Type 2 diabetes mellitus without complications: Secondary | ICD-10-CM | POA: Diagnosis not present

## 2019-11-09 DIAGNOSIS — N186 End stage renal disease: Secondary | ICD-10-CM | POA: Diagnosis not present

## 2019-11-09 DIAGNOSIS — T8249XS Other complication of vascular dialysis catheter, sequela: Secondary | ICD-10-CM | POA: Diagnosis not present

## 2019-11-09 DIAGNOSIS — N2581 Secondary hyperparathyroidism of renal origin: Secondary | ICD-10-CM | POA: Diagnosis not present

## 2019-11-10 ENCOUNTER — Other Ambulatory Visit: Payer: Self-pay

## 2019-11-10 ENCOUNTER — Ambulatory Visit (INDEPENDENT_AMBULATORY_CARE_PROVIDER_SITE_OTHER): Payer: Medicare Other | Admitting: Podiatry

## 2019-11-10 ENCOUNTER — Ambulatory Visit: Payer: Medicare Other | Admitting: Orthotics

## 2019-11-10 DIAGNOSIS — E1169 Type 2 diabetes mellitus with other specified complication: Secondary | ICD-10-CM

## 2019-11-10 DIAGNOSIS — E1151 Type 2 diabetes mellitus with diabetic peripheral angiopathy without gangrene: Secondary | ICD-10-CM | POA: Diagnosis not present

## 2019-11-10 DIAGNOSIS — I739 Peripheral vascular disease, unspecified: Secondary | ICD-10-CM

## 2019-11-10 DIAGNOSIS — B351 Tinea unguium: Secondary | ICD-10-CM

## 2019-11-10 DIAGNOSIS — I96 Gangrene, not elsewhere classified: Secondary | ICD-10-CM

## 2019-11-10 DIAGNOSIS — E08621 Diabetes mellitus due to underlying condition with foot ulcer: Secondary | ICD-10-CM

## 2019-11-10 NOTE — Progress Notes (Signed)
  Subjective:  Patient ID: Timothy Faulkner, male    DOB: 1946-01-01,  MRN: [id number]  Chief Complaint  Patient presents with  . Diabetes Mellitus    Diabetic foot exam  . Nail Problem    Nail trim right foot.    74 y.o. male presents with the above complaint. History confirmed with patient.   Objective:  Physical Exam: warm, good capillary refill, nail exam onychomycosis of the toenails, no trophic changes or ulcerative lesions. DP pulses palpable and protective sensation absent Hx left BKA  No images are attached to the encounter.  Assessment:   1. Onychomycosis of multiple toenails with type 2 diabetes mellitus and peripheral angiopathy ([place])      Plan:  Patient was evaluated and treated and all questions answered.  Onychomycosis, Diabetes and PAD -Patient is diabetic with a qualifying condition for at risk foot care.  Procedure: Nail Debridement Rationale: Patient meets criteria for routine foot care due to PAD Type of Debridement: manual, sharp debridement. Instrumentation: Nail nipper, rotary burr. Number of Nails: 4  No follow-ups on file.

## 2019-11-10 NOTE — Progress Notes (Signed)
Patient cast for dbs.  Hx of bka left, PAD, DPN.

## 2019-11-11 DIAGNOSIS — Z992 Dependence on renal dialysis: Secondary | ICD-10-CM | POA: Diagnosis not present

## 2019-11-11 DIAGNOSIS — E119 Type 2 diabetes mellitus without complications: Secondary | ICD-10-CM | POA: Diagnosis not present

## 2019-11-11 DIAGNOSIS — N2581 Secondary hyperparathyroidism of renal origin: Secondary | ICD-10-CM | POA: Diagnosis not present

## 2019-11-11 DIAGNOSIS — D689 Coagulation defect, unspecified: Secondary | ICD-10-CM | POA: Diagnosis not present

## 2019-11-11 DIAGNOSIS — N186 End stage renal disease: Secondary | ICD-10-CM | POA: Diagnosis not present

## 2019-11-14 DIAGNOSIS — N186 End stage renal disease: Secondary | ICD-10-CM | POA: Diagnosis not present

## 2019-11-14 DIAGNOSIS — N2581 Secondary hyperparathyroidism of renal origin: Secondary | ICD-10-CM | POA: Diagnosis not present

## 2019-11-14 DIAGNOSIS — E119 Type 2 diabetes mellitus without complications: Secondary | ICD-10-CM | POA: Diagnosis not present

## 2019-11-14 DIAGNOSIS — Z992 Dependence on renal dialysis: Secondary | ICD-10-CM | POA: Diagnosis not present

## 2019-11-14 DIAGNOSIS — D689 Coagulation defect, unspecified: Secondary | ICD-10-CM | POA: Diagnosis not present

## 2019-11-16 DIAGNOSIS — N186 End stage renal disease: Secondary | ICD-10-CM | POA: Diagnosis not present

## 2019-11-16 DIAGNOSIS — Z992 Dependence on renal dialysis: Secondary | ICD-10-CM | POA: Diagnosis not present

## 2019-11-16 DIAGNOSIS — N2581 Secondary hyperparathyroidism of renal origin: Secondary | ICD-10-CM | POA: Diagnosis not present

## 2019-11-16 DIAGNOSIS — E119 Type 2 diabetes mellitus without complications: Secondary | ICD-10-CM | POA: Diagnosis not present

## 2019-11-16 DIAGNOSIS — D689 Coagulation defect, unspecified: Secondary | ICD-10-CM | POA: Diagnosis not present

## 2019-11-18 DIAGNOSIS — Z89512 Acquired absence of left leg below knee: Secondary | ICD-10-CM | POA: Diagnosis not present

## 2019-11-18 DIAGNOSIS — D689 Coagulation defect, unspecified: Secondary | ICD-10-CM | POA: Diagnosis not present

## 2019-11-18 DIAGNOSIS — E119 Type 2 diabetes mellitus without complications: Secondary | ICD-10-CM | POA: Diagnosis not present

## 2019-11-18 DIAGNOSIS — Z992 Dependence on renal dialysis: Secondary | ICD-10-CM | POA: Diagnosis not present

## 2019-11-18 DIAGNOSIS — L97512 Non-pressure chronic ulcer of other part of right foot with fat layer exposed: Secondary | ICD-10-CM | POA: Diagnosis not present

## 2019-11-18 DIAGNOSIS — N2581 Secondary hyperparathyroidism of renal origin: Secondary | ICD-10-CM | POA: Diagnosis not present

## 2019-11-18 DIAGNOSIS — Z9189 Other specified personal risk factors, not elsewhere classified: Secondary | ICD-10-CM | POA: Diagnosis not present

## 2019-11-18 DIAGNOSIS — N186 End stage renal disease: Secondary | ICD-10-CM | POA: Diagnosis not present

## 2019-11-21 DIAGNOSIS — N2581 Secondary hyperparathyroidism of renal origin: Secondary | ICD-10-CM | POA: Diagnosis not present

## 2019-11-21 DIAGNOSIS — D689 Coagulation defect, unspecified: Secondary | ICD-10-CM | POA: Diagnosis not present

## 2019-11-21 DIAGNOSIS — E119 Type 2 diabetes mellitus without complications: Secondary | ICD-10-CM | POA: Diagnosis not present

## 2019-11-21 DIAGNOSIS — Z992 Dependence on renal dialysis: Secondary | ICD-10-CM | POA: Diagnosis not present

## 2019-11-21 DIAGNOSIS — N186 End stage renal disease: Secondary | ICD-10-CM | POA: Diagnosis not present

## 2019-11-22 ENCOUNTER — Encounter (INDEPENDENT_AMBULATORY_CARE_PROVIDER_SITE_OTHER): Payer: Medicare Other | Admitting: Ophthalmology

## 2019-11-22 ENCOUNTER — Encounter: Payer: Medicare Other | Admitting: Internal Medicine

## 2019-11-23 DIAGNOSIS — D689 Coagulation defect, unspecified: Secondary | ICD-10-CM | POA: Diagnosis not present

## 2019-11-23 DIAGNOSIS — N2581 Secondary hyperparathyroidism of renal origin: Secondary | ICD-10-CM | POA: Diagnosis not present

## 2019-11-23 DIAGNOSIS — N186 End stage renal disease: Secondary | ICD-10-CM | POA: Diagnosis not present

## 2019-11-23 DIAGNOSIS — E119 Type 2 diabetes mellitus without complications: Secondary | ICD-10-CM | POA: Diagnosis not present

## 2019-11-23 DIAGNOSIS — Z992 Dependence on renal dialysis: Secondary | ICD-10-CM | POA: Diagnosis not present

## 2019-11-25 DIAGNOSIS — D689 Coagulation defect, unspecified: Secondary | ICD-10-CM | POA: Diagnosis not present

## 2019-11-25 DIAGNOSIS — Z992 Dependence on renal dialysis: Secondary | ICD-10-CM | POA: Diagnosis not present

## 2019-11-25 DIAGNOSIS — N2581 Secondary hyperparathyroidism of renal origin: Secondary | ICD-10-CM | POA: Diagnosis not present

## 2019-11-25 DIAGNOSIS — N186 End stage renal disease: Secondary | ICD-10-CM | POA: Diagnosis not present

## 2019-11-25 DIAGNOSIS — E119 Type 2 diabetes mellitus without complications: Secondary | ICD-10-CM | POA: Diagnosis not present

## 2019-11-28 DIAGNOSIS — N2581 Secondary hyperparathyroidism of renal origin: Secondary | ICD-10-CM | POA: Diagnosis not present

## 2019-11-28 DIAGNOSIS — E119 Type 2 diabetes mellitus without complications: Secondary | ICD-10-CM | POA: Diagnosis not present

## 2019-11-28 DIAGNOSIS — Z992 Dependence on renal dialysis: Secondary | ICD-10-CM | POA: Diagnosis not present

## 2019-11-28 DIAGNOSIS — N186 End stage renal disease: Secondary | ICD-10-CM | POA: Diagnosis not present

## 2019-11-28 DIAGNOSIS — D689 Coagulation defect, unspecified: Secondary | ICD-10-CM | POA: Diagnosis not present

## 2019-11-29 ENCOUNTER — Encounter (INDEPENDENT_AMBULATORY_CARE_PROVIDER_SITE_OTHER): Payer: Self-pay | Admitting: Ophthalmology

## 2019-11-29 ENCOUNTER — Other Ambulatory Visit: Payer: Self-pay

## 2019-11-29 ENCOUNTER — Ambulatory Visit (INDEPENDENT_AMBULATORY_CARE_PROVIDER_SITE_OTHER): Payer: Medicare Other | Admitting: Ophthalmology

## 2019-11-29 DIAGNOSIS — H353134 Nonexudative age-related macular degeneration, bilateral, advanced atrophic with subfoveal involvement: Secondary | ICD-10-CM | POA: Insufficient documentation

## 2019-11-29 DIAGNOSIS — E113552 Type 2 diabetes mellitus with stable proliferative diabetic retinopathy, left eye: Secondary | ICD-10-CM | POA: Insufficient documentation

## 2019-11-29 DIAGNOSIS — E113492 Type 2 diabetes mellitus with severe nonproliferative diabetic retinopathy without macular edema, left eye: Secondary | ICD-10-CM

## 2019-11-29 DIAGNOSIS — E113551 Type 2 diabetes mellitus with stable proliferative diabetic retinopathy, right eye: Secondary | ICD-10-CM | POA: Insufficient documentation

## 2019-11-29 DIAGNOSIS — H3554 Dystrophies primarily involving the retinal pigment epithelium: Secondary | ICD-10-CM | POA: Insufficient documentation

## 2019-11-29 DIAGNOSIS — E113491 Type 2 diabetes mellitus with severe nonproliferative diabetic retinopathy without macular edema, right eye: Secondary | ICD-10-CM | POA: Diagnosis not present

## 2019-11-29 NOTE — Assessment & Plan Note (Signed)
The nature of dry age related macular degeneration was discussed with the patient as well as its possible conversion to wet. The results of the AREDS 2 study was discussed with the patient. A diet rich in dark leafy green vegetables was advised and specific recommendations were made regarding supplements with AREDS 2 formulation . Control of hypertension and serum cholesterol may slow the disease. Smoking cessation is mandatory to slow the disease and diminish the risk of progressing to wet age related macular degeneration. The patient was instructed in the use of an [place] and was told to return immediately for any changes in the Grid. Stressed to the patient do not rub eyes  OU, there is no need for active interventional retinal therapy.  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  There is no active CNVM.  Findings were discussed with the patient.

## 2019-11-29 NOTE — Progress Notes (Signed)
11/29/2019     CHIEF COMPLAINT Patient presents for Diabetic Eye Exam   HISTORY OF PRESENT ILLNESS: Timothy Faulkner is a 74 y.o. male who presents to the clinic today for:   HPI    Diabetic Eye Exam    Vision is blurred for near, is blurred for distance and is worsening.  Associated Symptoms Negative for Flashes and Floaters.  Diabetes characteristics include Type 2.  Blood sugar level is controlled.  Last Blood Glucose 117.  Associated Diagnosis Dialysis.  I, the attending physician,  performed the HPI with the patient and updated documentation appropriately.          Comments    Pt referred by Dr. Venetia Maxon for a diabetic exam OU. OCT  Pt states NVA and DVA has decreased. Pt states he has had previous injections in OU at [facility]. Denies FOL and floaters.       Last edited by Tilda Franco on 11/29/2019  9:23 AM. (History)      Referring physician: Marylynn Pearson, MD [address],  [place] [address]  HISTORICAL INFORMATION:   Selected notes from the MEDICAL RECORD NUMBER    Lab Results  Component Value Date   HGBA1C 5.2 03/12/2019     CURRENT MEDICATIONS: No current outpatient medications on file. (Ophthalmic Drugs)   No current facility-administered medications for this visit. (Ophthalmic Drugs)   Current Outpatient Medications (Other)  Medication Sig  . acetaminophen (TYLENOL) 500 MG tablet Take 500 mg by mouth every 6 (six) hours as needed for mild pain or headache.  . Amino Acids-Protein Hydrolys (FEEDING SUPPLEMENT, PRO-STAT SUGAR FREE 64,) LIQD Take 30 mLs by mouth 2 (two) times daily.  Marland Kitchen aspirin EC 81 MG tablet Take 1 tablet (81 mg total) by mouth daily.  Lorin Picket 1 GM 210 MG(Fe) tablet Take 210 mg by mouth 3 (three) times daily.   . bisacodyl (DULCOLAX) 5 MG EC tablet Take 1 tablet (5 mg total) by mouth daily as needed for moderate constipation.  . calcitRIOL (ROCALTROL) 0.25 MCG capsule Take 0.25 mcg by mouth every  Monday, Wednesday, and Friday.  . Calcium Carbonate Antacid (CALCIUM CARBONATE, DOSED IN MG ELEMENTAL CALCIUM,) 1250 MG/5ML SUSP Take 5 mLs (500 mg of elemental calcium total) by mouth every 6 (six) hours as needed for indigestion.  . cinacalcet (SENSIPAR) 30 MG tablet Take 1 tablet (30 mg total) by mouth every other day.  . clopidogrel (PLAVIX) 75 MG tablet Take 1 tablet (75 mg total) by mouth daily with breakfast.  . colchicine 0.6 MG tablet Take 0.6 mg by mouth daily as needed (as directed for gout flares).   . collagenase (SANTYL) ointment Apply topically daily. Apply as directed per home health nurse.  Apply to right foot wound edge to edge nickel thick.  Cover with wet gauze followed by dry gauze and change daily  . docusate sodium (COLACE) 100 MG capsule Take 2 capsules (200 mg total) by mouth 2 (two) times daily.  Marland Kitchen HYDROcodone-acetaminophen (NORCO) 5-325 MG tablet Take 1 tablet by mouth every 6 (six) hours as needed for moderate pain.  . methocarbamol (ROBAXIN) 500 MG tablet Take 1 tablet (500 mg total) by mouth every 12 (twelve) hours as needed for muscle spasms.  . Methoxy PEG-Epoetin Beta (MIRCERA IJ) Mircera  . midodrine (PROAMATINE) 10 MG tablet Take 1 tablet (10 mg total) by mouth every Monday, Wednesday, and Friday with hemodialysis.  Marland Kitchen oxyCODONE (OXY IR/ROXICODONE) 5 MG immediate  release tablet Take by mouth.  . rosuvastatin (CRESTOR) 10 MG tablet Take 1 tablet (10 mg total) by mouth daily at 6 PM.  . silver sulfADIAZINE (SILVADENE) 1 % cream Apply pea-sized amount to wound daily.   No current facility-administered medications for this visit. (Other)      REVIEW OF SYSTEMS: ROS    Positive for: Endocrine   Last edited by Tilda Franco on 11/29/2019  9:23 AM. (History)       ALLERGIES No Known Allergies  PAST MEDICAL HISTORY Past Medical History:  Diagnosis Date  . Anemia   . Arthritis    Gout  . Automatic implantable cardioverter-defibrillator in situ     [employer]  . CHF   . ESRD on dialysis [facility])    [name] 03/11/2013 (03/11/2013) dialysis M/W/F  . Gangrene ([place])    left foot  . GERD (gastroesophageal reflux disease)   . Gout    "once a year"  . Heart murmur   . HYPERCHOLESTEROLEMIA, MIXED   . HYPERTENSION   . Macular degeneration   . Osteomyelitis ([place])    right foot  . 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 Pacemaker   . Peripheral arterial disease (HCC)    left fifth toe ulcer, healing  . Pneumonia   . Shortness of breath   . 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 Wears dentures   . Wears glasses    Past Surgical History:  Procedure Laterality Date  . A/V FISTULAGRAM Left 07/20/2018   Procedure: A/V FISTULAGRAM;  Surgeon: [name], MD;  Location: [place] CV LAB;  Service: Cardiovascular;  Laterality: Left;  . A/V FISTULAGRAM N/A 07/15/2019   Procedure: A/V FISTULAGRAM - Left Arm;  Surgeon: Elam Dutch, MD;  Location: [place] CV LAB;  Service: Cardiovascular;  Laterality: N/A;  . ABDOMINAL AORTOGRAM N/A 02/25/2019   Procedure: ABDOMINAL AORTOGRAM;  Surgeon: Elam Dutch, MD;  Location: [place] CV LAB;  Service: Cardiovascular;  Laterality: N/A;  . ABDOMINAL AORTOGRAM W/LOWER EXTREMITY Bilateral 07/20/2018   Procedure: ABDOMINAL AORTOGRAM W/LOWER EXTREMITY;  Surgeon: [name], MD;  Location: [place] CV LAB;  Service: Cardiovascular;  Laterality: Bilateral;  . AMPUTATION Right 09/07/2018   Procedure: Right fifth metatarsectomy;  Surgeon: [name], DPM;  Location: [place];  Service: Podiatry;  Laterality: Right;  . AMPUTATION Left 03/08/2019   Procedure: AMPUTATION  3RD AND 4TH TOES LEFT FOOT;  Surgeon: [name], DPM;  Location: [place];  Service: Podiatry;  Laterality: Left;  . AMPUTATION Left 03/15/2019   Procedure: AMPUTATION BELOW KNEE;  Surgeon: Elam Dutch, MD;  Location: [facility] OR;  Service: Vascular;  Laterality:  Left;  . AV FISTULA PLACEMENT Right 12/13/2012   Procedure: ARTERIOVENOUS (AV) FISTULA CREATION;  Surgeon: [name], MD;  Location: [place];  Service: Vascular;  Laterality: Right;  Ultrasound guided  . AV FISTULA PLACEMENT Left 05/07/2016   Procedure: LEFT RADIOCEPHALIC ARTERIOVENOUS (AV) FISTULA CREATION;  Surgeon: [name], MD;  Location: [facility] OR;  Service: Vascular;  Laterality: Left;  . AV FISTULA PLACEMENT Right 08/09/2019   Procedure: INSERTION OF RIGHT ARTERIOVENOUS (AV) 4-31mm GORE-TEX STRETCH GRAFT RIGHT  ARM;  Surgeon: Elam Dutch, MD;  Location: [place];  Service: Vascular;  Laterality: Right;  . BASCILIC VEIN TRANSPOSITION Right 03/26/2016   Procedure: RIGHT BASILIC VEIN TRANSPOSITION;  Surgeon: [name], MD;  Location: [place];  Service: Vascular;  Laterality: Right;  . CARDIAC CATHETERIZATION    . CARDIAC DEFIBRILLATOR PLACEMENT     Chemical engineer  . CATARACT EXTRACTION W/PHACO Bilateral   . EYE SURGERY Bilateral    Cataract  . FISTULOGRAM Left 04/22/2018   Procedure: FISTULOGRAM UPPER EXTREMITY;  Surgeon: [name], MD;  Location: [place];  Service: Vascular;  Laterality: Left;  . I & D EXTREMITY Right 07/15/2018   Procedure: IRRIGATION AND DEBRIDEMENT RIGHT FOOT;  Surgeon: [name], DPM;  Location: [place];  Service: Podiatry;  Laterality: Right;  . INCISION AND DRAINAGE ABSCESS / HEMATOMA OF B[place]SA / KNEE / THIGH Left 2012   "knee" (03/11/2013)  . INSERT / REPLACE / REMOVE PACEMAKER    . INSERTION OF DIALYSIS CATHETER Left 04/22/2018   Procedure: INSERTION OF DIALYSIS CATHETER;  Surgeon: [name], MD;  Location: [place];  Service: Vascular;  Laterality: Left;  . IR FLUORO GUIDE CV LINE LEFT  07/15/2018  . IR PTA VENOUS EXCEPT DIALYSIS CIRCUIT  07/15/2018  . LOWER EXTREMITY ANGIOGRAPHY Right 07/21/2018   Procedure: LOWER EXTREMITY ANGIOGRAPHY;  Surgeon: [name], MD;  Location: [place] CV LAB;  Service: Cardiovascular;  Laterality:  Right;  . LOWER EXTREMITY ANGIOGRAPHY Bilateral 02/25/2019   Procedure: Lower Extremity Angiography;  Surgeon: Elam Dutch, MD;  Location: [place] CV LAB;  Service: Cardiovascular;  Laterality: Bilateral;  . METATARSAL HEAD EXCISION Right 07/15/2018   Procedure: METATARSAL RESECTION;  Surgeon: [name], DPM;  Location: [place];  Service: Podiatry;  Laterality: Right;  . MULTIPLE TOOTH EXTRACTIONS    . PERIPHERAL VASCULAR ATHERECTOMY Right 02/28/2019   Procedure: PERIPHERAL VASCULAR ATHERECTOMY;  Surgeon: Waynetta Sandy, MD;  Location: [place] CV LAB;  Service: Cardiovascular;  Laterality: Right;  right tp trunk   . PERIPHERAL VASCULAR BALLOON ANGIOPLASTY Left 02/25/2019   Procedure: PERIPHERAL VASCULAR BALLOON ANGIOPLASTY;  Surgeon: Elam Dutch, MD;  Location: [place] CV LAB;  Service: Cardiovascular;  Laterality: Left;  tibial peroneal trunk and PT  . PERIPHERAL VASCULAR INTERVENTION Right 07/21/2018   Procedure: PERIPHERAL VASCULAR INTERVENTION;  Surgeon: [name], MD;  Location: [place] CV LAB;  Service: Cardiovascular;  Laterality: Right;  peroneal stents  . REVISON OF ARTERIOVENOUS FISTULA Right 09/12/5425   Procedure: Plication OF Right Arm ARTERIOVENOUS FISTULA;  Surgeon: Elam Dutch, MD;  Location: [facility] OR;  Service: Vascular;  Laterality: Right;  . REVISON OF ARTERIOVENOUS FISTULA Left 04/22/2018   Procedure: REVISION OF RADIOCEPHALIC ARTERIOVENOUS FISTULA;  Surgeon: [name], MD;  Location: [place];  Service: Vascular;  Laterality: Left;  . SHUNTOGRAM N/A 05/31/2013   Procedure: [place];  Surgeon: [name], MD;  Location: [facility] CATH LAB;  Service: Cardiovascular;  Laterality: N/A;  . UPPER EXTREMITY VENOGRAPHY Right 07/23/2018   Procedure: UPPER EXTREMITY VENOGRAPHY;  Surgeon: [name], MD;  Location: [place] CV LAB;  Service: Cardiovascular;  Laterality: Right;  . UPPER EXTREMITY VENOGRAPHY   07/15/2019   Procedure: UPPER EXTREMITY VENOGRAPHY;  Surgeon: Elam Dutch, MD;  Location: [place] CV LAB;  Service: Cardiovascular;;  rt arm   . WOUND DEBRIDEMENT Right 07/17/2018   Procedure: Wound Debridement; Closure Filleted toe flap Right Foot;  Surgeon: [name], DPM;  Location: [place];  Service: Podiatry;  Laterality: Right;  . WOUND DEBRIDEMENT Right 09/07/2018   Procedure: Debridement Right Foot Wound, application of wound vac;  Surgeon: [name], DPM;  Location: [place];  Service: Podiatry;  Laterality: Right;  . WOUND DEBRIDEMENT Right 03/08/2019   Procedure: DEBRIDEMENT WOUND RIGHT FOOT;  Surgeon: [name], DPM;  Location: [place];  Service: Podiatry;  Laterality: Right;    FAMILY HISTORY Family History  Problem Relation Age of Onset  . Heart disease Father   . CAD Father     SOCIAL HISTORY Social History   Tobacco Use  . Smoking status: Former Smoker    Packs/day: 0.75    Years: 30.00    Pack years: 22.50    Types: Cigarettes    Quit date: 11/25/1992    Years since quitting: 27.0  . Smokeless tobacco: Never Used  Substance Use Topics  . Alcohol use: Not Currently  . Drug use: Not Currently    Types: Marijuana    Comment: last use 10 years ago         OPHTHALMIC EXAM:  Base Eye Exam    Visual Acuity (Snellen - Linear)      Right Left   Dist [place] 20/200 20/200   Dist ph [employer] NI NI       Tonometry (Tonopen, 9:32 AM)      Right Left   Pressure 11 12       Pupils      Pupils Dark Light Shape React APD   Right PERRL 3 3 Round Minimal None   Left PERRL 3 3 Round Minimal None       Visual Fields (Counting fingers)      Left Right    Full Full       Neuro/Psych    Oriented x3: Yes   Mood/Affect: Normal       Dilation    Both eyes: 1.0% Mydriacyl, 2.5% Phenylephrine @ 9:32 AM        Slit Lamp and Fundus Exam    External Exam      Right Left   External Normal        Slit Lamp Exam      Right Left   Lids/Lashes  Normal    Conjunctiva/Sclera White and quiet    Cornea Clear    Anterior Chamber Deep and quiet    Iris Round and reactive    Lens Posterior chamber intraocular lens Posterior chamber intraocular lens   Anterior Vitreous Normal        Fundus Exam      Right Left   Posterior Vitreous Central vitreous floaters, Vitreous syneresis Central vitreous floaters, Vitreous syneresis   Disc Normal Normal   C/D Ratio 0.4 0.4   Macula Central pigmentation, macula, outer retinal atrophy, Mottling, Retinal pigment epithelial mottling, Atrophy, Macular atrophy Central pigmentation, macula, outer retinal atrophy, Mottling, Retinal pigment epithelial mottling, Atrophy, Macular atrophy   Vessels Severe NPDR Severe NPDR   Periphery Normal Moderate scatter laser photocoagulation scars          IMAGING AND PROCEDURES  Imaging and Procedures for 11/29/19  OCT, Retina - OU - Both Eyes       Right Eye Quality was good. Scan locations included subfoveal. Findings include outer retinal atrophy, central retinal atrophy, inner retinal atrophy, abnormal foveal contour.   Left Eye Quality was borderline. Scan locations included subfoveal. Findings include abnormal foveal contour, outer retinal atrophy, central retinal atrophy, inner retinal atrophy.   Notes OD,Outer retinal atrophy similar to what seen in adult vitelliform macular dystrophy  OS, outer retinal atrophy with no active clinically significant macular edema.  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 The options of continued observation versus  panretinal photocoagulation at this time were reviewed as well as the risks, benefits, and alternatives. More recent option includes the use of ocular injectable medications to slow progression of retinal disease. Tight control of glucose, blood pressure, and serum lipid levels were recommended under the direction of general physician or endocrinologist, as well as avoidance of smoking and maintenance of normal body weight. The 2-year risk of progression to proliferative diabetic retinopathy is 60%.  OD will schedule peripheral panretinal photocoagulation to prevent progression of ischemic disease to proliferative diabetic retinopathy.  OS already has moderate scatter laser photocoagulation in place.      ICD-10-CM   1. 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 Severe nonproliferative diabetic retinopathy of left eye without macular edema associated with type 2 diabetes mellitus (HCC)  K53.9767 OCT, Retina - OU - Both Eyes  3. Adult onset vitelliform macular dystrophy  H35.54 OCT, Retina - OU - Both Eyes  4. 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  2.  3.  Ophthalmic Meds Ordered this visit:  No orders of the defined types were placed in this encounter.      No follow-ups on file.  There are no Patient Instructions on file for this visit.   Explained the diagnoses, plan, and follow up with the patient and they expressed understanding.  Patient expressed understanding of the importance of proper follow up care.   Clent Demark Belvia Gotschall M.D. Diseases & Surgery of the Retina and Vitreous Retina & Diabetic [place] 11/29/19     Abbreviations: M myopia (nearsighted); A astigmatism; H hyperopia (farsighted); P presbyopia; Mrx spectacle prescription;  CTL contact  lenses; OD right eye; OS left eye; OU both eyes  XT exotropia; ET esotropia; PEK punctate epithelial keratitis; PEE punctate epithelial erosions; DES dry eye syndrome; MGD meibomian gland dysfunction; ATs artificial tears; PFAT's preservative free artificial tears; [place] nuclear sclerotic cataract; PSC posterior subcapsular cataract; ERM epi-retinal membrane; PVD posterior vitreous detachment; RD retinal detachment; DM diabetes mellitus; DR diabetic retinopathy; NPDR non-proliferative diabetic retinopathy; PDR proliferative diabetic retinopathy; CSME clinically significant macular edema; DME diabetic macular edema; dbh dot blot hemorrhages; CWS cotton wool spot; POAG primary open angle glaucoma; C/D cup-to-disc ratio; HVF humphrey visual field; GVF goldmann visual field; OCT optical coherence tomography; IOP intraocular pressure; BRVO Branch retinal vein occlusion; CRVO central retinal vein occlusion; CRAO central retinal artery occlusion; BRAO branch retinal artery occlusion; RT retinal tear; SB scleral buckle; PPV pars plana vitrectomy; VH Vitreous hemorrhage; PRP panretinal laser photocoagulation; IVK intravitreal kenalog; VMT vitreomacular traction; MH Macular hole;  NVD neovascularization of the disc; NVE neovascularization elsewhere; AREDS age related eye disease study; ARMD age related macular degeneration; POAG primary open angle glaucoma; EBMD epithelial/anterior basement membrane dystrophy; ACIOL anterior chamber intraocular lens; IOL intraocular lens; PCIOL posterior chamber intraocular lens; Phaco/IOL phacoemulsification with intraocular lens  placement; [place] photorefractive keratectomy; LASIK laser assisted in situ keratomileusis; HTN hypertension; DM diabetes mellitus; COPD chronic obstructive pulmonary disease

## 2019-11-29 NOTE — Assessment & Plan Note (Signed)
The nature of severe nonproliferative diabetic retinopathy discussed with the patient as well as the need for more frequent follow up and likely progression to proliferative disease in the near future. The options of continued observation versus panretinal photocoagulation at this time were reviewed as well as the risks, benefits, and alternatives. More recent option includes the use of ocular injectable medications to slow progression of retinal disease. Tight control of glucose, blood pressure, and serum lipid levels were recommended under the direction of general physician or endocrinologist, as well as avoidance of smoking and maintenance of normal body weight. The 2-year risk of progression to proliferative diabetic retinopathy is 60%.  OD will schedule peripheral panretinal photocoagulation to prevent progression of ischemic disease to proliferative diabetic retinopathy.  OS already has moderate scatter laser photocoagulation in place.

## 2019-11-29 NOTE — Assessment & Plan Note (Signed)
OS, no evidence of active CSME

## 2019-11-30 DIAGNOSIS — N186 End stage renal disease: Secondary | ICD-10-CM | POA: Diagnosis not present

## 2019-11-30 DIAGNOSIS — D689 Coagulation defect, unspecified: Secondary | ICD-10-CM | POA: Diagnosis not present

## 2019-11-30 DIAGNOSIS — Z992 Dependence on renal dialysis: Secondary | ICD-10-CM | POA: Diagnosis not present

## 2019-11-30 DIAGNOSIS — E119 Type 2 diabetes mellitus without complications: Secondary | ICD-10-CM | POA: Diagnosis not present

## 2019-11-30 DIAGNOSIS — N2581 Secondary hyperparathyroidism of renal origin: Secondary | ICD-10-CM | POA: Diagnosis not present

## 2019-12-02 DIAGNOSIS — N2581 Secondary hyperparathyroidism of renal origin: Secondary | ICD-10-CM | POA: Diagnosis not present

## 2019-12-02 DIAGNOSIS — N186 End stage renal disease: Secondary | ICD-10-CM | POA: Diagnosis not present

## 2019-12-02 DIAGNOSIS — Z992 Dependence on renal dialysis: Secondary | ICD-10-CM | POA: Diagnosis not present

## 2019-12-02 DIAGNOSIS — D689 Coagulation defect, unspecified: Secondary | ICD-10-CM | POA: Diagnosis not present

## 2019-12-02 DIAGNOSIS — E119 Type 2 diabetes mellitus without complications: Secondary | ICD-10-CM | POA: Diagnosis not present

## 2019-12-05 DIAGNOSIS — N2581 Secondary hyperparathyroidism of renal origin: Secondary | ICD-10-CM | POA: Diagnosis not present

## 2019-12-05 DIAGNOSIS — N186 End stage renal disease: Secondary | ICD-10-CM | POA: Diagnosis not present

## 2019-12-05 DIAGNOSIS — Z992 Dependence on renal dialysis: Secondary | ICD-10-CM | POA: Diagnosis not present

## 2019-12-05 DIAGNOSIS — E119 Type 2 diabetes mellitus without complications: Secondary | ICD-10-CM | POA: Diagnosis not present

## 2019-12-05 DIAGNOSIS — D689 Coagulation defect, unspecified: Secondary | ICD-10-CM | POA: Diagnosis not present

## 2019-12-07 DIAGNOSIS — E119 Type 2 diabetes mellitus without complications: Secondary | ICD-10-CM | POA: Diagnosis not present

## 2019-12-07 DIAGNOSIS — Z992 Dependence on renal dialysis: Secondary | ICD-10-CM | POA: Diagnosis not present

## 2019-12-07 DIAGNOSIS — N186 End stage renal disease: Secondary | ICD-10-CM | POA: Diagnosis not present

## 2019-12-07 DIAGNOSIS — D689 Coagulation defect, unspecified: Secondary | ICD-10-CM | POA: Diagnosis not present

## 2019-12-07 DIAGNOSIS — N2581 Secondary hyperparathyroidism of renal origin: Secondary | ICD-10-CM | POA: Diagnosis not present

## 2019-12-09 DIAGNOSIS — D689 Coagulation defect, unspecified: Secondary | ICD-10-CM | POA: Diagnosis not present

## 2019-12-09 DIAGNOSIS — Z992 Dependence on renal dialysis: Secondary | ICD-10-CM | POA: Diagnosis not present

## 2019-12-09 DIAGNOSIS — I12 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 chronic kidney disease or end stage renal disease: Secondary | ICD-10-CM | POA: Diagnosis not present

## 2019-12-09 DIAGNOSIS — E119 Type 2 diabetes mellitus without complications: Secondary | ICD-10-CM | POA: Diagnosis not present

## 2019-12-09 DIAGNOSIS — N2581 Secondary hyperparathyroidism of renal origin: Secondary | ICD-10-CM | POA: Diagnosis not present

## 2019-12-09 DIAGNOSIS — N186 End stage renal disease: Secondary | ICD-10-CM | POA: Diagnosis not present

## 2019-12-12 DIAGNOSIS — E119 Type 2 diabetes mellitus without complications: Secondary | ICD-10-CM | POA: Diagnosis not present

## 2019-12-12 DIAGNOSIS — N186 End stage renal disease: Secondary | ICD-10-CM | POA: Diagnosis not present

## 2019-12-12 DIAGNOSIS — Z992 Dependence on renal dialysis: Secondary | ICD-10-CM | POA: Diagnosis not present

## 2019-12-12 DIAGNOSIS — D689 Coagulation defect, unspecified: Secondary | ICD-10-CM | POA: Diagnosis not present

## 2019-12-12 DIAGNOSIS — N2581 Secondary hyperparathyroidism of renal origin: Secondary | ICD-10-CM | POA: Diagnosis not present

## 2019-12-12 DIAGNOSIS — R52 Pain, unspecified: Secondary | ICD-10-CM | POA: Diagnosis not present

## 2019-12-13 ENCOUNTER — Other Ambulatory Visit: Payer: Self-pay

## 2019-12-13 ENCOUNTER — Encounter: Payer: Self-pay | Admitting: Internal Medicine

## 2019-12-13 ENCOUNTER — Ambulatory Visit (INDEPENDENT_AMBULATORY_CARE_PROVIDER_SITE_OTHER): Payer: Medicare Other | Admitting: Internal Medicine

## 2019-12-13 VITALS — BP 108/77 | HR 80 | Ht 68.0 in | Wt 166.0 lb

## 2019-12-13 DIAGNOSIS — I472 Ventricular tachycardia, unspecified: Secondary | ICD-10-CM

## 2019-12-13 DIAGNOSIS — I5022 Chronic systolic (congestive) heart failure: Secondary | ICD-10-CM | POA: Diagnosis not present

## 2019-12-13 DIAGNOSIS — Z95810 Presence of automatic (implantable) cardiac defibrillator: Secondary | ICD-10-CM | POA: Diagnosis not present

## 2019-12-13 NOTE — Patient Instructions (Signed)
Medication Instructions:  Your physician recommends that you continue on your current medications as directed. Please refer to the Current Medication list given to you today.  Labwork: None ordered.  Testing/Procedures: None ordered.  Follow-Up: Your physician wants you to follow-up in: one year with Dr. Lovena Le.   You will receive a reminder letter in the mail two months in advance. If you don't receive a letter, please call our office to schedule the follow-up appointment.  Remote monitoring is used to monitor your ICD from home. This monitoring reduces the number of office visits required to check your device to one time per year. It allows [place] to keep an eye on the functioning of your device to ensure it is working properly. You are scheduled for a device check from home on 01/23/2020. You may send your transmission at any time that day. If you have a wireless device, the transmission will be sent automatically. After your physician reviews your transmission, you will receive a postcard with your next transmission date.  Any Other Special Instructions Will Be Listed Below (If Applicable).  If you need a refill on your cardiac medications before your next appointment, please call your pharmacy.

## 2019-12-13 NOTE — Progress Notes (Signed)
HPI Mr. Sirico returns today for followup. He is an unfortunate 74 yo man with a h/o chronic systolic heart failure due to a non-ischemic CM, HTN, and periupheral vascular disease, s/p ICD insertion. He has developed ESRD and is on HD. He has undergone left BKA. He has become more sedentary and weak. He has a remote VF episode and appropriate ICD shock.  No Known Allergies   Current Outpatient Medications  Medication Sig Dispense Refill  . acetaminophen (TYLENOL) 500 MG tablet Take 500 mg by mouth every 6 (six) hours as needed for mild pain or headache.    . Amino Acids-Protein Hydrolys (FEEDING SUPPLEMENT, PRO-STAT SUGAR FREE 64,) LIQD Take 30 mLs by mouth 2 (two) times daily. 887 mL 0  . aspirin EC 81 MG tablet Take 1 tablet (81 mg total) by mouth daily. 150 tablet 2  . AURYXIA 1 GM 210 MG(Fe) tablet Take 210 mg by mouth 3 (three) times daily.     . bisacodyl (DULCOLAX) 5 MG EC tablet Take 1 tablet (5 mg total) by mouth daily as needed for moderate constipation. 30 tablet 0  . calcitRIOL (ROCALTROL) 0.25 MCG capsule Take 0.25 mcg by mouth every Monday, Wednesday, and Friday.    . Calcium Carbonate Antacid (CALCIUM CARBONATE, DOSED IN MG ELEMENTAL CALCIUM,) 1250 MG/5ML SUSP Take 5 mLs (500 mg of elemental calcium total) by mouth every 6 (six) hours as needed for indigestion. 450 mL 0  . cinacalcet (SENSIPAR) 30 MG tablet Take 1 tablet (30 mg total) by mouth every other day. 60 tablet 0  . clopidogrel (PLAVIX) 75 MG tablet Take 1 tablet (75 mg total) by mouth daily with breakfast. 30 tablet 0  . colchicine 0.6 MG tablet Take 0.6 mg by mouth daily as needed (as directed for gout flares).   1  . collagenase (SANTYL) ointment Apply topically daily. Apply as directed per home health nurse.  Apply to right foot wound edge to edge nickel thick.  Cover with wet gauze followed by dry gauze and change daily 15 g 0  . docusate sodium (COLACE) 100 MG capsule Take 2 capsules (200 mg total) by mouth  2 (two) times daily. 10 capsule 0  . HYDROcodone-acetaminophen (NORCO) 5-325 MG tablet Take 1 tablet by mouth every 6 (six) hours as needed for moderate pain. 12 tablet 0  . methocarbamol (ROBAXIN) 500 MG tablet Take 1 tablet (500 mg total) by mouth every 12 (twelve) hours as needed for muscle spasms. 60 tablet 0  . Methoxy PEG-Epoetin Beta (MIRCERA IJ) Mircera    . midodrine (PROAMATINE) 10 MG tablet Take 1 tablet (10 mg total) by mouth every Monday, Wednesday, and Friday with hemodialysis. 60 tablet 6  . oxyCODONE (OXY IR/ROXICODONE) 5 MG immediate release tablet Take by mouth.    . rosuvastatin (CRESTOR) 10 MG tablet Take 1 tablet (10 mg total) by mouth daily at 6 PM. 30 tablet 0  . silver sulfADIAZINE (SILVADENE) 1 % cream Apply pea-sized amount to wound daily. 50 g 0   No current facility-administered medications for this visit.     Past Medical History:  Diagnosis Date  . Anemia   . Arthritis    Gout  . Automatic implantable cardioverter-defibrillator in situ    [employer]  . CHF   . ESRD on dialysis [facility])    [name] 03/11/2013 (03/11/2013) dialysis M/W/F  . Gangrene ([place])    left foot  . GERD (gastroesophageal reflux disease)   . Gout    "  once a year"  . Heart murmur   . HYPERCHOLESTEROLEMIA, MIXED   . HYPERTENSION   . Macular degeneration   . Osteomyelitis ([place])    right foot  . 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 Pacemaker   . Peripheral arterial disease (HCC)    left fifth toe ulcer, healing  . Pneumonia   . Shortness of breath   . 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 Wears dentures   . Wears glasses     ROS:   All systems reviewed and negative except as noted in the HPI.   Past Surgical History:  Procedure Laterality Date  . A/V FISTULAGRAM Left 07/20/2018   Procedure: A/V FISTULAGRAM;  Surgeon: [name], MD;  Location: [place] [facility];  Service: Cardiovascular;  Laterality: Left;  .  A/V FISTULAGRAM N/A 07/15/2019   Procedure: A/V FISTULAGRAM - Left Arm;  Surgeon: Elam Dutch, MD;  Location: [place] [facility];  Service: Cardiovascular;  Laterality: N/A;  . ABDOMINAL AORTOGRAM N/A 02/25/2019   Procedure: ABDOMINAL AORTOGRAM;  Surgeon: Elam Dutch, MD;  Location: [place] [facility];  Service: Cardiovascular;  Laterality: N/A;  . ABDOMINAL AORTOGRAM W/LOWER EXTREMITY Bilateral 07/20/2018   Procedure: ABDOMINAL AORTOGRAM W/LOWER EXTREMITY;  Surgeon: [name], MD;  Location: [place] [facility];  Service: Cardiovascular;  Laterality: Bilateral;  . AMPUTATION Right 09/07/2018   Procedure: Right fifth metatarsectomy;  Surgeon: [name], DPM;  Location: [place];  Service: Podiatry;  Laterality: Right;  . AMPUTATION Left 03/08/2019   Procedure: AMPUTATION  3RD AND 4TH TOES LEFT FOOT;  Surgeon: [name], DPM;  Location: [place];  Service: Podiatry;  Laterality: Left;  . AMPUTATION Left 03/15/2019   Procedure: AMPUTATION BELOW KNEE;  Surgeon: Elam Dutch, MD;  Location[facility] OR;  Service: Vascular;  Laterality: Left;  . AV FISTULA PLACEMENT Right 12/13/2012   Procedure: ARTERIOVENOUS (AV) FISTULA CREATION;  Surgeon: [name], MD;  Location: [place];  Service: Vascular;  Laterality: Right;  Ultrasound guided  . AV FISTULA PLACEMENT Left 05/07/2016   Procedure: LEFT RADIOCEPHALIC ARTERIOVENOUS (AV) FISTULA CREATION;  Surgeon: [name], MD;  Location: [facility] OR;  Service: Vascular;  Laterality: Left;  . AV FISTULA PLACEMENT Right 08/09/2019   Procedure: INSERTION OF RIGHT ARTERIOVENOUS (AV) 4-52mm GORE-TEX STRETCH GRAFT RIGHT  ARM;  Surgeon: Elam Dutch, MD;  Location: [place];  Service: Vascular;  Laterality: Right;  . BASCILIC VEIN TRANSPOSITION Right 03/26/2016   Procedure: RIGHT BASILIC VEIN TRANSPOSITION;  Surgeon: [name], MD;  Location: [place];  Service: Vascular;  Laterality: Right;  . CARDIAC CATHETERIZATION    . CARDIAC DEFIBRILLATOR PLACEMENT      Chemical engineer  . CATARACT EXTRACTION W/PHACO Bilateral   . EYE SURGERY Bilateral    Cataract  . FISTULOGRAM Left 04/22/2018   Procedure: FISTULOGRAM UPPER EXTREMITY;  Surgeon: [name], MD;  Location: [place];  Service: Vascular;  Laterality: Left;  . I & D EXTREMITY Right 07/15/2018   Procedure: IRRIGATION AND DEBRIDEMENT RIGHT FOOT;  Surgeon: [name], DPM;  Location: [place];  Service: Podiatry;  Laterality: Right;  . INCISION AND DRAINAGE ABSCESS / HEMATOMA OF BURSA / KNEE / THIGH Left 2012   "knee" (03/11/2013)  . INSERT / REPLACE / REMOVE PACEMAKER    . INSERTION OF DIALYSIS CATHETER Left 04/22/2018   Procedure: INSERTION OF DIALYSIS CATHETER;  Surgeon: [name], MD;  Location: [place];  Service: Vascular;  Laterality: Left;  . IR FLUORO GUIDE CV LINE LEFT  07/15/2018  . IR PTA VENOUS EXCEPT DIALYSIS CIRCUIT  07/15/2018  . LOWER EXTREMITY ANGIOGRAPHY Right 07/21/2018   Procedure: LOWER EXTREMITY ANGIOGRAPHY;  Surgeon: [name], MD;  Location: [place] [facility];  Service: Cardiovascular;  Laterality: Right;  . LOWER EXTREMITY ANGIOGRAPHY Bilateral 02/25/2019   Procedure: Lower Extremity Angiography;  Surgeon: Elam Dutch, MD;  Location: [place] [facility];  Service: Cardiovascular;  Laterality: Bilateral;  . METATARSAL HEAD EXCISION Right 07/15/2018   Procedure: METATARSAL RESECTION;  Surgeon: [name], DPM;  Location: [place];  Service: Podiatry;  Laterality: Right;  . MULTIPLE TOOTH EXTRACTIONS    . PERIPHERAL VASCULAR ATHERECTOMY Right 02/28/2019   Procedure: PERIPHERAL VASCULAR ATHERECTOMY;  Surgeon: Waynetta Sandy, MD;  Location: [place] [facility];  Service: Cardiovascular;  Laterality: Right;  right tp trunk   . PERIPHERAL VASCULAR BALLOON ANGIOPLASTY Left 02/25/2019   Procedure: PERIPHERAL VASCULAR BALLOON ANGIOPLASTY;  Surgeon: Elam Dutch, MD;  Location: [place] [facility];  Service: Cardiovascular;  Laterality:  Left;  tibial peroneal trunk and PT  . PERIPHERAL VASCULAR INTERVENTION Right 07/21/2018   Procedure: PERIPHERAL VASCULAR INTERVENTION;  Surgeon: [name], MD;  Location: [place] [facility];  Service: Cardiovascular;  Laterality: Right;  peroneal stents  . REVISON OF ARTERIOVENOUS FISTULA Right 03/19/9832   Procedure: Plication OF Right Arm ARTERIOVENOUS FISTULA;  Surgeon: Elam Dutch, MD;  Location: [facility] OR;  Service: Vascular;  Laterality: Right;  . REVISON OF ARTERIOVENOUS FISTULA Left 04/22/2018   Procedure: REVISION OF RADIOCEPHALIC ARTERIOVENOUS FISTULA;  Surgeon: [name], MD;  Location: [place];  Service: Vascular;  Laterality: Left;  . SHUNTOGRAM N/A 05/31/2013   Procedure: Earney Mallet;  Surgeon: [name], MD;  Location: [facility] CATH LAB;  Service: Cardiovascular;  Laterality: N/A;  . UPPER EXTREMITY VENOGRAPHY Right 07/23/2018   Procedure: UPPER EXTREMITY VENOGRAPHY;  Surgeon: [name], MD;  Location: [place] [facility];  Service: Cardiovascular;  Laterality: Right;  . UPPER EXTREMITY VENOGRAPHY  07/15/2019   Procedure: UPPER EXTREMITY VENOGRAPHY;  Surgeon: Elam Dutch, MD;  Location: [place] [facility];  Service: Cardiovascular;;  rt arm   . WOUND DEBRIDEMENT Right 07/17/2018   Procedure: Wound Debridement; Closure Filleted toe flap Right Foot;  Surgeon: [name], DPM;  Location: [place];  Service: Podiatry;  Laterality: Right;  . WOUND DEBRIDEMENT Right 09/07/2018   Procedure: Debridement Right Foot Wound, application of wound vac;  Surgeon: [name], DPM;  Location: [place];  Service: Podiatry;  Laterality: Right;  . WOUND DEBRIDEMENT Right 03/08/2019   Procedure: DEBRIDEMENT WOUND RIGHT FOOT;  Surgeon: [name], DPM;  Location: [place];  Service: Podiatry;  Laterality: Right;     Family History  Problem Relation Age of Onset  . Heart disease Father   . CAD Father      Social History   Socioeconomic History  .  Marital status: Married    Spouse name: [name]  . Number of children: 2  . Years of education: 44  . Highest education level: Not on file  Occupational History  . Occupation: diasbled  Tobacco Use  . Smoking status: Former Smoker    Packs/day: 0.75    Years: 30.00    Pack years: 22.50    Types: Cigarettes    Quit date: 11/25/1992  Years since quitting: 27.0  . Smokeless tobacco: Never Used  Substance and Sexual Activity  . Alcohol use: Not Currently  . Drug use: Not Currently    Types: Marijuana    Comment: last use 10 years ago  . Sexual activity: Yes  Other Topics Concern  . 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 Difficulty of Paying Living Expenses:   Food Insecurity:   . Worried About Charity fundraiser in the Last Year:   . Arboriculturist in the Last Year:   Transportation Needs:   . Film/video editor (Medical):   Marland Kitchen Lack of Transportation (Non-Medical):   Physical Activity:   . Days of Exercise per Week:   . Minutes of Exercise per Session:   Stress:   . Feeling of Stress :   Social Connections:   . Frequency of Communication with Friends and Family:   . Frequency of Social Gatherings with Friends and Family:   . Attends Religious Services:   . Active Member of Clubs or Organizations:   . Attends Archivist Meetings:   Marland Kitchen Marital Status:   Intimate Partner Violence:   . Fear of Current or Ex-Partner:   . Emotionally Abused:   Marland Kitchen Physically Abused:   . Sexually Abused:      BP 108/77   Pulse 80   Ht 5\' 8"  (1.727 m)   Wt 166 lb (75.3 kg)   SpO2 96%   BMI 25.24 kg/m   Physical Exam:  Well appearing NAD HEENT: Unremarkable Neck:  No JVD, no thyromegally Lymphatics:  No adenopathy Back:  No CVA tenderness Lungs:  Clear  with no wheezes HEART:  Regular rate rhythm, no murmurs, no rubs, no clicks Abd:  soft, positive bowel sounds, no organomegally, no rebound, no guarding Ext:  2 plus pulses, left BKA, no cyanosis, no clubbing Skin:  No rashes no nodules Neuro:  CN II through XII intact, motor grossly intact   DEVICE  Normal device function.  See PaceArt for details.   Assess/Plan: 1. Chronic systolic heart failure - his symptoms are controlled with volume removal during HD.  2. VF - he has not had any additional ventricular arrhythmias. We will follow 3. ICD - his [employer] single chamber ICD is working normally.   Mikle [email or web address].D.

## 2019-12-14 ENCOUNTER — Telehealth: Payer: Self-pay | Admitting: Podiatry

## 2019-12-14 DIAGNOSIS — R52 Pain, unspecified: Secondary | ICD-10-CM | POA: Diagnosis not present

## 2019-12-14 DIAGNOSIS — N186 End stage renal disease: Secondary | ICD-10-CM | POA: Diagnosis not present

## 2019-12-14 DIAGNOSIS — E119 Type 2 diabetes mellitus without complications: Secondary | ICD-10-CM | POA: Diagnosis not present

## 2019-12-14 DIAGNOSIS — Z992 Dependence on renal dialysis: Secondary | ICD-10-CM | POA: Diagnosis not present

## 2019-12-14 DIAGNOSIS — D689 Coagulation defect, unspecified: Secondary | ICD-10-CM | POA: Diagnosis not present

## 2019-12-14 DIAGNOSIS — N2581 Secondary hyperparathyroidism of renal origin: Secondary | ICD-10-CM | POA: Diagnosis not present

## 2019-12-14 NOTE — Telephone Encounter (Signed)
Called pt about diabetic shoes because paperwork states last office visit with pcp was sept 2020 and it has to be within 6 months. Per pts daughter pt has appt Friday with pcp and I have refaxed paperwork with that information on the forms.

## 2019-12-15 DIAGNOSIS — N186 End stage renal disease: Secondary | ICD-10-CM | POA: Diagnosis not present

## 2019-12-15 DIAGNOSIS — I871 Compression of vein: Secondary | ICD-10-CM | POA: Diagnosis not present

## 2019-12-15 DIAGNOSIS — Z992 Dependence on renal dialysis: Secondary | ICD-10-CM | POA: Diagnosis not present

## 2019-12-15 DIAGNOSIS — T82858A Stenosis of vascular prosthetic devices, implants and grafts, initial encounter: Secondary | ICD-10-CM | POA: Diagnosis not present

## 2019-12-16 DIAGNOSIS — N186 End stage renal disease: Secondary | ICD-10-CM | POA: Diagnosis not present

## 2019-12-16 DIAGNOSIS — E119 Type 2 diabetes mellitus without complications: Secondary | ICD-10-CM | POA: Diagnosis not present

## 2019-12-16 DIAGNOSIS — D689 Coagulation defect, unspecified: Secondary | ICD-10-CM | POA: Diagnosis not present

## 2019-12-16 DIAGNOSIS — R52 Pain, unspecified: Secondary | ICD-10-CM | POA: Diagnosis not present

## 2019-12-16 DIAGNOSIS — Z992 Dependence on renal dialysis: Secondary | ICD-10-CM | POA: Diagnosis not present

## 2019-12-16 DIAGNOSIS — N2581 Secondary hyperparathyroidism of renal origin: Secondary | ICD-10-CM | POA: Diagnosis not present

## 2019-12-18 DIAGNOSIS — L97512 Non-pressure chronic ulcer of other part of right foot with fat layer exposed: Secondary | ICD-10-CM | POA: Diagnosis not present

## 2019-12-18 DIAGNOSIS — Z89512 Acquired absence of left leg below knee: Secondary | ICD-10-CM | POA: Diagnosis not present

## 2019-12-18 DIAGNOSIS — Z9189 Other specified personal risk factors, not elsewhere classified: Secondary | ICD-10-CM | POA: Diagnosis not present

## 2019-12-19 DIAGNOSIS — N186 End stage renal disease: Secondary | ICD-10-CM | POA: Diagnosis not present

## 2019-12-19 DIAGNOSIS — E119 Type 2 diabetes mellitus without complications: Secondary | ICD-10-CM | POA: Diagnosis not present

## 2019-12-19 DIAGNOSIS — Z992 Dependence on renal dialysis: Secondary | ICD-10-CM | POA: Diagnosis not present

## 2019-12-19 DIAGNOSIS — D689 Coagulation defect, unspecified: Secondary | ICD-10-CM | POA: Diagnosis not present

## 2019-12-19 DIAGNOSIS — N2581 Secondary hyperparathyroidism of renal origin: Secondary | ICD-10-CM | POA: Diagnosis not present

## 2019-12-19 DIAGNOSIS — R52 Pain, unspecified: Secondary | ICD-10-CM | POA: Diagnosis not present

## 2019-12-20 ENCOUNTER — Ambulatory Visit (INDEPENDENT_AMBULATORY_CARE_PROVIDER_SITE_OTHER): Payer: Medicare Other | Admitting: Ophthalmology

## 2019-12-20 ENCOUNTER — Other Ambulatory Visit: Payer: Self-pay

## 2019-12-20 ENCOUNTER — Encounter (INDEPENDENT_AMBULATORY_CARE_PROVIDER_SITE_OTHER): Payer: Self-pay | Admitting: Ophthalmology

## 2019-12-20 DIAGNOSIS — E113491 Type 2 diabetes mellitus with severe nonproliferative diabetic retinopathy without macular edema, right eye: Secondary | ICD-10-CM

## 2019-12-20 NOTE — Assessment & Plan Note (Signed)
PRP completed OD today

## 2019-12-20 NOTE — Progress Notes (Signed)
12/20/2019     CHIEF COMPLAINT Patient presents for Retina Follow Up   HISTORY OF PRESENT ILLNESS: Timothy Faulkner is a 74 y.o. male who presents to the clinic today for:   HPI    Retina Follow Up    Patient presents with  Diabetic Retinopathy.  In both eyes.  Duration of 3 weeks.  Since onset it is stable.          Comments    3 week follow up- Possible PRP OD Patient denies change in vision and overall has no complaints. LBS 115 yesterday A1C 15 Jul 2020       Last edited by Gerda Diss on 12/20/2019  9:53 AM. (History)      Referring physician: [name], MD [phone],  [place] [address]  HISTORICAL INFORMATION:   Selected notes from the MEDICAL RECORD NUMBER    Lab Results  Component Value Date   HGBA1C 5.2 03/12/2019     CURRENT MEDICATIONS: No current outpatient medications on file. (Ophthalmic Drugs)   No current facility-administered medications for this visit. (Ophthalmic Drugs)   Current Outpatient Medications (Other)  Medication Sig  . acetaminophen (TYLENOL) 500 MG tablet Take 500 mg by mouth every 6 (six) hours as needed for mild pain or headache.  . Amino Acids-Protein Hydrolys (FEEDING SUPPLEMENT, PRO-STAT SUGAR FREE 64,) LIQD Take 30 mLs by mouth 2 (two) times daily.  Marland Kitchen aspirin EC 81 MG tablet Take 1 tablet (81 mg total) by mouth daily.  Lorin Picket 1 GM 210 MG(Fe) tablet Take 210 mg by mouth 3 (three) times daily.   . bisacodyl (DULCOLAX) 5 MG EC tablet Take 1 tablet (5 mg total) by mouth daily as needed for moderate constipation.  . calcitRIOL (ROCALTROL) 0.25 MCG capsule Take 0.25 mcg by mouth every Monday, Wednesday, and Friday.  . Calcium Carbonate Antacid (CALCIUM CARBONATE, DOSED IN MG ELEMENTAL CALCIUM,) 1250 MG/5ML SUSP Take 5 mLs (500 mg of elemental calcium total) by mouth every 6 (six) hours as needed for indigestion.  . cinacalcet (SENSIPAR) 30 MG tablet Take 1 tablet (30 mg total) by mouth every other  day.  . clopidogrel (PLAVIX) 75 MG tablet Take 1 tablet (75 mg total) by mouth daily with breakfast.  . colchicine 0.6 MG tablet Take 0.6 mg by mouth daily as needed (as directed for gout flares).   . collagenase (SANTYL) ointment Apply topically daily. Apply as directed per home health nurse.  Apply to right foot wound edge to edge nickel thick.  Cover with wet gauze followed by dry gauze and change daily  . docusate sodium (COLACE) 100 MG capsule Take 2 capsules (200 mg total) by mouth 2 (two) times daily.  Marland Kitchen HYDROcodone-acetaminophen (NORCO) 5-325 MG tablet Take 1 tablet by mouth every 6 (six) hours as needed for moderate pain.  . methocarbamol (ROBAXIN) 500 MG tablet Take 1 tablet (500 mg total) by mouth every 12 (twelve) hours as needed for muscle spasms.  . Methoxy PEG-Epoetin Beta (MIRCERA IJ) Mircera  . midodrine (PROAMATINE) 10 MG tablet Take 1 tablet (10 mg total) by mouth every Monday, Wednesday, and Friday with hemodialysis.  Marland Kitchen oxyCODONE (OXY IR/ROXICODONE) 5 MG immediate release tablet Take by mouth.  . rosuvastatin (CRESTOR) 10 MG tablet Take 1 tablet (10 mg total) by mouth daily at 6 PM.  . silver sulfADIAZINE (SILVADENE) 1 % cream Apply pea-sized amount to wound daily.   No current facility-administered medications for this visit. (Other)  REVIEW OF SYSTEMS:    ALLERGIES No Known Allergies  PAST MEDICAL HISTORY Past Medical History:  Diagnosis Date  . Anemia   . Arthritis    Gout  . Automatic implantable cardioverter-defibrillator in situ    [employer]  . CHF   . ESRD on dialysis [facility])    [name] 03/11/2013 (03/11/2013) dialysis M/W/F  . Gangrene ([place])    left foot  . GERD (gastroesophageal reflux disease)   . Gout    "once a year"  . Heart murmur   . HYPERCHOLESTEROLEMIA, MIXED   . HYPERTENSION   . Macular degeneration   . Osteomyelitis ([place])    right foot  . 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 Pacemaker   . Peripheral arterial disease  (HCC)    left fifth toe ulcer, healing  . Pneumonia   . Shortness of breath   . 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 Wears dentures   . Wears glasses    Past Surgical History:  Procedure Laterality Date  . A/V FISTULAGRAM Left 07/20/2018   Procedure: A/V FISTULAGRAM;  Surgeon: [name], MD;  Location: [place] [facility];  Service: Cardiovascular;  Laterality: Left;  . A/V FISTULAGRAM N/A 07/15/2019   Procedure: A/V FISTULAGRAM - Left Arm;  Surgeon: Elam Dutch, MD;  Location: [place] [facility];  Service: Cardiovascular;  Laterality: N/A;  . ABDOMINAL AORTOGRAM N/A 02/25/2019   Procedure: ABDOMINAL AORTOGRAM;  Surgeon: Elam Dutch, MD;  Location: [place] [facility];  Service: Cardiovascular;  Laterality: N/A;  . ABDOMINAL AORTOGRAM W/LOWER EXTREMITY Bilateral 07/20/2018   Procedure: ABDOMINAL AORTOGRAM W/LOWER EXTREMITY;  Surgeon: [name], MD;  Location: [place] [facility];  Service: Cardiovascular;  Laterality: Bilateral;  . AMPUTATION Right 09/07/2018   Procedure: Right fifth metatarsectomy;  Surgeon: [name], DPM;  Location: [place];  Service: Podiatry;  Laterality: Right;  . AMPUTATION Left 03/08/2019   Procedure: AMPUTATION  3RD AND 4TH TOES LEFT FOOT;  Surgeon: [name], DPM;  Location: [place];  Service: Podiatry;  Laterality: Left;  . AMPUTATION Left 03/15/2019   Procedure: AMPUTATION BELOW KNEE;  Surgeon: Elam Dutch, MD;  Location: [facility] OR;  Service: Vascular;  Laterality: Left;  . AV FISTULA PLACEMENT Right 12/13/2012   Procedure: ARTERIOVENOUS (AV) FISTULA CREATION;  Surgeon: [name], MD;  Location: [place];  Service: Vascular;  Laterality: Right;  Ultrasound guided  . AV FISTULA PLACEMENT Left 05/07/2016   Procedure: LEFT RADIOCEPHALIC ARTERIOVENOUS (AV) FISTULA CREATION;  Surgeon: [name], MD;  Location: [facility];  Service: Vascular;  Laterality: Left;  . AV FISTULA PLACEMENT Right  08/09/2019   Procedure: INSERTION OF RIGHT ARTERIOVENOUS (AV) 4-33mm GORE-TEX STRETCH GRAFT RIGHT  ARM;  Surgeon: Elam Dutch, MD;  Location: [place];  Service: Vascular;  Laterality: Right;  . BASCILIC VEIN TRANSPOSITION Right 03/26/2016   Procedure: RIGHT BASILIC VEIN TRANSPOSITION;  Surgeon: [name], MD;  Location: [place];  Service: Vascular;  Laterality: Right;  . CARDIAC CATHETERIZATION    . CARDIAC DEFIBRILLATOR PLACEMENT     Chemical engineer  . CATARACT EXTRACTION W/PHACO Bilateral   . EYE SURGERY Bilateral    Cataract  . FISTULOGRAM Left 04/22/2018   Procedure: FISTULOGRAM UPPER EXTREMITY;  Surgeon: [name], MD;  Location: [place];  Service: Vascular;  Laterality: Left;  . I & D EXTREMITY Right 07/15/2018   Procedure:  IRRIGATION AND DEBRIDEMENT RIGHT FOOT;  Surgeon: [name], DPM;  Location: [place];  Service: Podiatry;  Laterality: Right;  . INCISION AND DRAINAGE ABSCESS / HEMATOMA OF BURSA / KNEE / THIGH Left 2012   "knee" (03/11/2013)  . INSERT / REPLACE / REMOVE PACEMAKER    . INSERTION OF DIALYSIS CATHETER Left 04/22/2018   Procedure: INSERTION OF DIALYSIS CATHETER;  Surgeon: [name], MD;  Location: [place];  Service: Vascular;  Laterality: Left;  . IR FLUORO GUIDE CV LINE LEFT  07/15/2018  . IR PTA VENOUS EXCEPT DIALYSIS CIRCUIT  07/15/2018  . LOWER EXTREMITY ANGIOGRAPHY Right 07/21/2018   Procedure: LOWER EXTREMITY ANGIOGRAPHY;  Surgeon: [name], MD;  Location: [place] [facility];  Service: Cardiovascular;  Laterality: Right;  . LOWER EXTREMITY ANGIOGRAPHY Bilateral 02/25/2019   Procedure: Lower Extremity Angiography;  Surgeon: Elam Dutch, MD;  Location: [place] [facility];  Service: Cardiovascular;  Laterality: Bilateral;  . METATARSAL HEAD EXCISION Right 07/15/2018   Procedure: METATARSAL RESECTION;  Surgeon: [name], DPM;  Location: [place];  Service: Podiatry;  Laterality: Right;  . MULTIPLE TOOTH EXTRACTIONS    .  PERIPHERAL VASCULAR ATHERECTOMY Right 02/28/2019   Procedure: PERIPHERAL VASCULAR ATHERECTOMY;  Surgeon: Waynetta Sandy, MD;  Location: [place] [facility];  Service: Cardiovascular;  Laterality: Right;  right tp trunk   . PERIPHERAL VASCULAR BALLOON ANGIOPLASTY Left 02/25/2019   Procedure: PERIPHERAL VASCULAR BALLOON ANGIOPLASTY;  Surgeon: Elam Dutch, MD;  Location: [place] [facility];  Service: Cardiovascular;  Laterality: Left;  tibial peroneal trunk and PT  . PERIPHERAL VASCULAR INTERVENTION Right 07/21/2018   Procedure: PERIPHERAL VASCULAR INTERVENTION;  Surgeon: [name], MD;  Location: [place] [facility];  Service: Cardiovascular;  Laterality: Right;  peroneal stents  . REVISON OF ARTERIOVENOUS FISTULA Right 4/70/9628   Procedure: Plication OF Right Arm ARTERIOVENOUS FISTULA;  Surgeon: Elam Dutch, MD;  Location: [facility] OR;  Service: Vascular;  Laterality: Right;  . REVISON OF ARTERIOVENOUS FISTULA Left 04/22/2018   Procedure: REVISION OF RADIOCEPHALIC ARTERIOVENOUS FISTULA;  Surgeon: [name], MD;  Location: [place];  Service: Vascular;  Laterality: Left;  . SHUNTOGRAM N/A 05/31/2013   Procedure: Earney Mallet;  Surgeon: [name], MD;  Location: [facility] LAB;  Service: Cardiovascular;  Laterality: N/A;  . UPPER EXTREMITY VENOGRAPHY Right 07/23/2018   Procedure: UPPER EXTREMITY VENOGRAPHY;  Surgeon: [name], MD;  Location: [place] [facility];  Service: Cardiovascular;  Laterality: Right;  . UPPER EXTREMITY VENOGRAPHY  07/15/2019   Procedure: UPPER EXTREMITY VENOGRAPHY;  Surgeon: Elam Dutch, MD;  Location: [place] [facility];  Service: Cardiovascular;;  rt arm   . WOUND DEBRIDEMENT Right 07/17/2018   Procedure: Wound Debridement; Closure Filleted toe flap Right Foot;  Surgeon: [name], DPM;  Location: [place];  Service: Podiatry;  Laterality: Right;  . WOUND DEBRIDEMENT Right 09/07/2018   Procedure: Debridement Right Foot  Wound, application of wound vac;  Surgeon: [name], DPM;  Location: [place];  Service: Podiatry;  Laterality: Right;  . WOUND DEBRIDEMENT Right 03/08/2019   Procedure: DEBRIDEMENT WOUND RIGHT FOOT;  Surgeon: [name], DPM;  Location: [place];  Service: Podiatry;  Laterality: Right;    FAMILY HISTORY Family History  Problem Relation Age of Onset  . Heart disease Father   . CAD Father     SOCIAL HISTORY Social History   Tobacco Use  . Smoking status: Former Smoker  Packs/day: 0.75    Years: 30.00    Pack years: 22.50    Types: Cigarettes    Quit date: 11/25/1992    Years since quitting: 27.0  . Smokeless tobacco: Never Used  Substance Use Topics  . Alcohol use: Not Currently  . Drug use: Not Currently    Types: Marijuana    Comment: last use 10 years ago         OPHTHALMIC EXAM:  Base Eye Exam    Visual Acuity (Snellen - Linear)      Right Left   Dist [place] 20/200 20/200   Dist ph Universal NI NI       Tonometry (Tonopen, 9:58 AM)      Right Left   Pressure 9 9       Pupils      Pupils Dark Light Shape React APD   Right PERRL 3 3 Round Minimal None   Left PERRL 3 3 Round Minimal None       Visual Fields (Counting fingers)      Left Right    Full Full       Extraocular Movement      Right Left    Full Full       Neuro/Psych    Oriented x3: Yes   Mood/Affect: Normal       Dilation    Right eye: 1.0% Mydriacyl, 2.5% Phenylephrine @ 9:58 AM        Slit Lamp and Fundus Exam    External Exam      Right Left   External Normal Normal       Slit Lamp Exam      Right Left   Lids/Lashes Normal Normal   Conjunctiva/Sclera White and quiet White and quiet   Cornea Clear Clear   Anterior Chamber Deep and quiet Deep and quiet   Iris Round and reactive Round and reactive   Lens Posterior chamber intraocular lens Posterior chamber intraocular lens   Vitreous Normal Normal          IMAGING AND PROCEDURES  Imaging and Procedures for  12/20/19  Panretinal Photocoagulation - OD - Right Eye       Time Out Confirmed correct patient, procedure, site, and patient consented.   Anesthesia Topical anesthesia was used. Anesthetic medications included Proparacaine 0.5%.   Laser Information The type of laser was diode. Color was yellow. The duration in seconds was 0.03. The spot size was 390 microns. Laser power was 240. Total spots was 872.   Post-op The patient tolerated the procedure well. There were no complications. The patient received written and verbal post procedure care education.                 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 Severe nonproliferative diabetic retinopathy of right eye without macular edema associated with type 2 diabetes mellitus (HCC)  K34.9179 Panretinal Photocoagulation - OD - Right Eye    1.  PRP OD today.  This will help protect this stage of diabetic retinopathy in this patient with with difficult access laser application   2.  We will monitor OU and deliver aggressive therapy to prevent progression to PDR  3.  Ophthalmic Meds Ordered this visit:  No orders of the defined types were placed in this encounter.      Return in  about 3 months (around 03/21/2020) for DILATE OU, COLOR FP.  There are no Patient Instructions on file for this visit.   Explained the diagnoses, plan, and follow up with the patient and they expressed understanding.  Patient expressed understanding of the importance of proper follow up care.   Clent Demark Davonda Ausley M.D. Diseases & Surgery of the Retina and Vitreous Retina & Diabetic [place] 12/20/19     Abbreviations: M myopia (nearsighted); A astigmatism; H hyperopia (farsighted); P presbyopia; Mrx spectacle prescription;  CTL contact lenses; OD right eye; OS left eye; OU both eyes  XT  exotropia; ET esotropia; PEK punctate epithelial keratitis; PEE punctate epithelial erosions; DES dry eye syndrome; MGD meibomian gland dysfunction; ATs artificial tears; PFAT's preservative free artificial tears; [place] nuclear sclerotic cataract; PSC posterior subcapsular cataract; ERM epi-retinal membrane; PVD posterior vitreous detachment; RD retinal detachment; DM diabetes mellitus; DR diabetic retinopathy; NPDR non-proliferative diabetic retinopathy; PDR proliferative diabetic retinopathy; CSME clinically significant macular edema; DME diabetic macular edema; dbh dot blot hemorrhages; CWS cotton wool spot; POAG primary open angle glaucoma; C/D cup-to-disc ratio; HVF humphrey visual field; GVF goldmann visual field; OCT optical coherence tomography; IOP intraocular pressure; BRVO Branch retinal vein occlusion; CRVO central retinal vein occlusion; CRAO central retinal artery occlusion; BRAO branch retinal artery occlusion; RT retinal tear; SB scleral buckle; PPV pars plana vitrectomy; VH Vitreous hemorrhage; PRP panretinal laser photocoagulation; IVK intravitreal kenalog; VMT vitreomacular traction; MH Macular hole;  NVD neovascularization of the disc; NVE neovascularization elsewhere; AREDS age related eye disease study; ARMD age related macular degeneration; POAG primary open angle glaucoma; EBMD epithelial/anterior basement membrane dystrophy; ACIOL anterior chamber intraocular lens; IOL intraocular lens; PCIOL posterior chamber intraocular lens; Phaco/IOL phacoemulsification with intraocular lens placement; [place] photorefractive keratectomy; LASIK laser assisted in situ keratomileusis; HTN hypertension; DM diabetes mellitus; COPD chronic obstructive pulmonary disease

## 2019-12-21 DIAGNOSIS — N2581 Secondary hyperparathyroidism of renal origin: Secondary | ICD-10-CM | POA: Diagnosis not present

## 2019-12-21 DIAGNOSIS — N186 End stage renal disease: Secondary | ICD-10-CM | POA: Diagnosis not present

## 2019-12-21 DIAGNOSIS — Z992 Dependence on renal dialysis: Secondary | ICD-10-CM | POA: Diagnosis not present

## 2019-12-21 DIAGNOSIS — E119 Type 2 diabetes mellitus without complications: Secondary | ICD-10-CM | POA: Diagnosis not present

## 2019-12-21 DIAGNOSIS — R52 Pain, unspecified: Secondary | ICD-10-CM | POA: Diagnosis not present

## 2019-12-21 DIAGNOSIS — D689 Coagulation defect, unspecified: Secondary | ICD-10-CM | POA: Diagnosis not present

## 2019-12-23 DIAGNOSIS — E119 Type 2 diabetes mellitus without complications: Secondary | ICD-10-CM | POA: Diagnosis not present

## 2019-12-23 DIAGNOSIS — D689 Coagulation defect, unspecified: Secondary | ICD-10-CM | POA: Diagnosis not present

## 2019-12-23 DIAGNOSIS — N2581 Secondary hyperparathyroidism of renal origin: Secondary | ICD-10-CM | POA: Diagnosis not present

## 2019-12-23 DIAGNOSIS — Z992 Dependence on renal dialysis: Secondary | ICD-10-CM | POA: Diagnosis not present

## 2019-12-23 DIAGNOSIS — R52 Pain, unspecified: Secondary | ICD-10-CM | POA: Diagnosis not present

## 2019-12-23 DIAGNOSIS — N186 End stage renal disease: Secondary | ICD-10-CM | POA: Diagnosis not present

## 2019-12-26 DIAGNOSIS — N186 End stage renal disease: Secondary | ICD-10-CM | POA: Diagnosis not present

## 2019-12-26 DIAGNOSIS — Z992 Dependence on renal dialysis: Secondary | ICD-10-CM | POA: Diagnosis not present

## 2019-12-26 DIAGNOSIS — E119 Type 2 diabetes mellitus without complications: Secondary | ICD-10-CM | POA: Diagnosis not present

## 2019-12-26 DIAGNOSIS — N2581 Secondary hyperparathyroidism of renal origin: Secondary | ICD-10-CM | POA: Diagnosis not present

## 2019-12-26 DIAGNOSIS — D689 Coagulation defect, unspecified: Secondary | ICD-10-CM | POA: Diagnosis not present

## 2019-12-26 DIAGNOSIS — R52 Pain, unspecified: Secondary | ICD-10-CM | POA: Diagnosis not present

## 2019-12-28 DIAGNOSIS — N2581 Secondary hyperparathyroidism of renal origin: Secondary | ICD-10-CM | POA: Diagnosis not present

## 2019-12-28 DIAGNOSIS — Z992 Dependence on renal dialysis: Secondary | ICD-10-CM | POA: Diagnosis not present

## 2019-12-28 DIAGNOSIS — R52 Pain, unspecified: Secondary | ICD-10-CM | POA: Diagnosis not present

## 2019-12-28 DIAGNOSIS — E119 Type 2 diabetes mellitus without complications: Secondary | ICD-10-CM | POA: Diagnosis not present

## 2019-12-28 DIAGNOSIS — D689 Coagulation defect, unspecified: Secondary | ICD-10-CM | POA: Diagnosis not present

## 2019-12-28 DIAGNOSIS — N186 End stage renal disease: Secondary | ICD-10-CM | POA: Diagnosis not present

## 2019-12-30 DIAGNOSIS — E119 Type 2 diabetes mellitus without complications: Secondary | ICD-10-CM | POA: Diagnosis not present

## 2019-12-30 DIAGNOSIS — N186 End stage renal disease: Secondary | ICD-10-CM | POA: Diagnosis not present

## 2019-12-30 DIAGNOSIS — N2581 Secondary hyperparathyroidism of renal origin: Secondary | ICD-10-CM | POA: Diagnosis not present

## 2019-12-30 DIAGNOSIS — D689 Coagulation defect, unspecified: Secondary | ICD-10-CM | POA: Diagnosis not present

## 2019-12-30 DIAGNOSIS — R52 Pain, unspecified: Secondary | ICD-10-CM | POA: Diagnosis not present

## 2019-12-30 DIAGNOSIS — Z992 Dependence on renal dialysis: Secondary | ICD-10-CM | POA: Diagnosis not present

## 2020-01-02 DIAGNOSIS — D689 Coagulation defect, unspecified: Secondary | ICD-10-CM | POA: Diagnosis not present

## 2020-01-02 DIAGNOSIS — E119 Type 2 diabetes mellitus without complications: Secondary | ICD-10-CM | POA: Diagnosis not present

## 2020-01-02 DIAGNOSIS — R52 Pain, unspecified: Secondary | ICD-10-CM | POA: Diagnosis not present

## 2020-01-02 DIAGNOSIS — Z992 Dependence on renal dialysis: Secondary | ICD-10-CM | POA: Diagnosis not present

## 2020-01-02 DIAGNOSIS — N2581 Secondary hyperparathyroidism of renal origin: Secondary | ICD-10-CM | POA: Diagnosis not present

## 2020-01-02 DIAGNOSIS — N186 End stage renal disease: Secondary | ICD-10-CM | POA: Diagnosis not present

## 2020-01-04 DIAGNOSIS — D689 Coagulation defect, unspecified: Secondary | ICD-10-CM | POA: Diagnosis not present

## 2020-01-04 DIAGNOSIS — E119 Type 2 diabetes mellitus without complications: Secondary | ICD-10-CM | POA: Diagnosis not present

## 2020-01-04 DIAGNOSIS — N186 End stage renal disease: Secondary | ICD-10-CM | POA: Diagnosis not present

## 2020-01-04 DIAGNOSIS — N2581 Secondary hyperparathyroidism of renal origin: Secondary | ICD-10-CM | POA: Diagnosis not present

## 2020-01-04 DIAGNOSIS — R52 Pain, unspecified: Secondary | ICD-10-CM | POA: Diagnosis not present

## 2020-01-04 DIAGNOSIS — Z992 Dependence on renal dialysis: Secondary | ICD-10-CM | POA: Diagnosis not present

## 2020-01-06 DIAGNOSIS — T82868A Thrombosis of vascular prosthetic devices, implants and grafts, initial encounter: Secondary | ICD-10-CM | POA: Diagnosis not present

## 2020-01-06 DIAGNOSIS — D689 Coagulation defect, unspecified: Secondary | ICD-10-CM | POA: Diagnosis not present

## 2020-01-06 DIAGNOSIS — Z992 Dependence on renal dialysis: Secondary | ICD-10-CM | POA: Diagnosis not present

## 2020-01-06 DIAGNOSIS — N2581 Secondary hyperparathyroidism of renal origin: Secondary | ICD-10-CM | POA: Diagnosis not present

## 2020-01-06 DIAGNOSIS — I871 Compression of vein: Secondary | ICD-10-CM | POA: Diagnosis not present

## 2020-01-06 DIAGNOSIS — E119 Type 2 diabetes mellitus without complications: Secondary | ICD-10-CM | POA: Diagnosis not present

## 2020-01-06 DIAGNOSIS — N186 End stage renal disease: Secondary | ICD-10-CM | POA: Diagnosis not present

## 2020-01-06 DIAGNOSIS — R52 Pain, unspecified: Secondary | ICD-10-CM | POA: Diagnosis not present

## 2020-01-09 DIAGNOSIS — Z992 Dependence on renal dialysis: Secondary | ICD-10-CM | POA: Diagnosis not present

## 2020-01-09 DIAGNOSIS — I12 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 chronic kidney disease or end stage renal disease: Secondary | ICD-10-CM | POA: Diagnosis not present

## 2020-01-09 DIAGNOSIS — N2581 Secondary hyperparathyroidism of renal origin: Secondary | ICD-10-CM | POA: Diagnosis not present

## 2020-01-09 DIAGNOSIS — N186 End stage renal disease: Secondary | ICD-10-CM | POA: Diagnosis not present

## 2020-01-09 DIAGNOSIS — R52 Pain, unspecified: Secondary | ICD-10-CM | POA: Diagnosis not present

## 2020-01-09 DIAGNOSIS — D689 Coagulation defect, unspecified: Secondary | ICD-10-CM | POA: Diagnosis not present

## 2020-01-09 DIAGNOSIS — E119 Type 2 diabetes mellitus without complications: Secondary | ICD-10-CM | POA: Diagnosis not present

## 2020-01-11 DIAGNOSIS — E119 Type 2 diabetes mellitus without complications: Secondary | ICD-10-CM | POA: Diagnosis not present

## 2020-01-11 DIAGNOSIS — R52 Pain, unspecified: Secondary | ICD-10-CM | POA: Diagnosis not present

## 2020-01-11 DIAGNOSIS — N2581 Secondary hyperparathyroidism of renal origin: Secondary | ICD-10-CM | POA: Diagnosis not present

## 2020-01-11 DIAGNOSIS — Z992 Dependence on renal dialysis: Secondary | ICD-10-CM | POA: Diagnosis not present

## 2020-01-11 DIAGNOSIS — N186 End stage renal disease: Secondary | ICD-10-CM | POA: Diagnosis not present

## 2020-01-11 DIAGNOSIS — D689 Coagulation defect, unspecified: Secondary | ICD-10-CM | POA: Diagnosis not present

## 2020-01-12 DIAGNOSIS — I739 Peripheral vascular disease, unspecified: Secondary | ICD-10-CM | POA: Diagnosis not present

## 2020-01-12 DIAGNOSIS — I5020 Unspecified systolic (congestive) heart failure: Secondary | ICD-10-CM | POA: Diagnosis not present

## 2020-01-12 DIAGNOSIS — I38 Endocarditis, valve unspecified: Secondary | ICD-10-CM | POA: Diagnosis not present

## 2020-01-12 DIAGNOSIS — I10 Essential (primary) hypertension: Secondary | ICD-10-CM | POA: Diagnosis not present

## 2020-01-12 DIAGNOSIS — I420 Dilated cardiomyopathy: Secondary | ICD-10-CM | POA: Diagnosis not present

## 2020-01-13 DIAGNOSIS — N2581 Secondary hyperparathyroidism of renal origin: Secondary | ICD-10-CM | POA: Diagnosis not present

## 2020-01-13 DIAGNOSIS — E119 Type 2 diabetes mellitus without complications: Secondary | ICD-10-CM | POA: Diagnosis not present

## 2020-01-13 DIAGNOSIS — R52 Pain, unspecified: Secondary | ICD-10-CM | POA: Diagnosis not present

## 2020-01-13 DIAGNOSIS — N186 End stage renal disease: Secondary | ICD-10-CM | POA: Diagnosis not present

## 2020-01-13 DIAGNOSIS — D689 Coagulation defect, unspecified: Secondary | ICD-10-CM | POA: Diagnosis not present

## 2020-01-13 DIAGNOSIS — Z992 Dependence on renal dialysis: Secondary | ICD-10-CM | POA: Diagnosis not present

## 2020-01-16 DIAGNOSIS — Z992 Dependence on renal dialysis: Secondary | ICD-10-CM | POA: Diagnosis not present

## 2020-01-16 DIAGNOSIS — N2581 Secondary hyperparathyroidism of renal origin: Secondary | ICD-10-CM | POA: Diagnosis not present

## 2020-01-16 DIAGNOSIS — N186 End stage renal disease: Secondary | ICD-10-CM | POA: Diagnosis not present

## 2020-01-16 DIAGNOSIS — D689 Coagulation defect, unspecified: Secondary | ICD-10-CM | POA: Diagnosis not present

## 2020-01-16 DIAGNOSIS — E119 Type 2 diabetes mellitus without complications: Secondary | ICD-10-CM | POA: Diagnosis not present

## 2020-01-16 DIAGNOSIS — R52 Pain, unspecified: Secondary | ICD-10-CM | POA: Diagnosis not present

## 2020-01-18 DIAGNOSIS — L97512 Non-pressure chronic ulcer of other part of right foot with fat layer exposed: Secondary | ICD-10-CM | POA: Diagnosis not present

## 2020-01-18 DIAGNOSIS — Z9189 Other specified personal risk factors, not elsewhere classified: Secondary | ICD-10-CM | POA: Diagnosis not present

## 2020-01-18 DIAGNOSIS — R52 Pain, unspecified: Secondary | ICD-10-CM | POA: Diagnosis not present

## 2020-01-18 DIAGNOSIS — D689 Coagulation defect, unspecified: Secondary | ICD-10-CM | POA: Diagnosis not present

## 2020-01-18 DIAGNOSIS — N2581 Secondary hyperparathyroidism of renal origin: Secondary | ICD-10-CM | POA: Diagnosis not present

## 2020-01-18 DIAGNOSIS — Z992 Dependence on renal dialysis: Secondary | ICD-10-CM | POA: Diagnosis not present

## 2020-01-18 DIAGNOSIS — N186 End stage renal disease: Secondary | ICD-10-CM | POA: Diagnosis not present

## 2020-01-18 DIAGNOSIS — E119 Type 2 diabetes mellitus without complications: Secondary | ICD-10-CM | POA: Diagnosis not present

## 2020-01-18 DIAGNOSIS — Z89512 Acquired absence of left leg below knee: Secondary | ICD-10-CM | POA: Diagnosis not present

## 2020-01-20 DIAGNOSIS — N2581 Secondary hyperparathyroidism of renal origin: Secondary | ICD-10-CM | POA: Diagnosis not present

## 2020-01-20 DIAGNOSIS — R52 Pain, unspecified: Secondary | ICD-10-CM | POA: Diagnosis not present

## 2020-01-20 DIAGNOSIS — Z992 Dependence on renal dialysis: Secondary | ICD-10-CM | POA: Diagnosis not present

## 2020-01-20 DIAGNOSIS — N186 End stage renal disease: Secondary | ICD-10-CM | POA: Diagnosis not present

## 2020-01-20 DIAGNOSIS — D689 Coagulation defect, unspecified: Secondary | ICD-10-CM | POA: Diagnosis not present

## 2020-01-20 DIAGNOSIS — E119 Type 2 diabetes mellitus without complications: Secondary | ICD-10-CM | POA: Diagnosis not present

## 2020-01-23 ENCOUNTER — Ambulatory Visit (INDEPENDENT_AMBULATORY_CARE_PROVIDER_SITE_OTHER): Payer: Medicare Other | Admitting: *Deleted

## 2020-01-23 DIAGNOSIS — I420 Dilated cardiomyopathy: Secondary | ICD-10-CM | POA: Diagnosis not present

## 2020-01-23 DIAGNOSIS — E119 Type 2 diabetes mellitus without complications: Secondary | ICD-10-CM | POA: Diagnosis not present

## 2020-01-23 DIAGNOSIS — N2581 Secondary hyperparathyroidism of renal origin: Secondary | ICD-10-CM | POA: Diagnosis not present

## 2020-01-23 DIAGNOSIS — N186 End stage renal disease: Secondary | ICD-10-CM | POA: Diagnosis not present

## 2020-01-23 DIAGNOSIS — D689 Coagulation defect, unspecified: Secondary | ICD-10-CM | POA: Diagnosis not present

## 2020-01-23 DIAGNOSIS — Z992 Dependence on renal dialysis: Secondary | ICD-10-CM | POA: Diagnosis not present

## 2020-01-23 DIAGNOSIS — R52 Pain, unspecified: Secondary | ICD-10-CM | POA: Diagnosis not present

## 2020-01-24 LAB — CUP PACEART REMOTE DEVICE CHECK
Battery Remaining Longevity: 48 mo
Battery Remaining Percentage: 51 %
Brady Statistic RV Percent Paced: 0 %
Date Time Interrogation Session: 20210614013100
HighPow Impedance: 111 Ohm
Implantable Lead Implant Date: 20111118
Implantable Lead Location: 753860
Implantable Lead Model: 181
Implantable Lead Serial Number: 313758
Implantable Pulse Generator Implant Date: 20111118
Lead Channel Impedance Value: 578 Ohm
Lead Channel Pacing Threshold Amplitude: 0.7 V
Lead Channel Pacing Threshold Pulse Width: 0.5 ms
Lead Channel Setting Pacing Amplitude: 2.4 V
Lead Channel Setting Pacing Pulse Width: 0.5 ms
Lead Channel Setting Sensing Sensitivity: 0.5 mV
Pulse Gen Serial Number: 268731

## 2020-01-24 NOTE — Progress Notes (Signed)
Remote ICD transmission.   

## 2020-01-25 DIAGNOSIS — N186 End stage renal disease: Secondary | ICD-10-CM | POA: Diagnosis not present

## 2020-01-25 DIAGNOSIS — N2581 Secondary hyperparathyroidism of renal origin: Secondary | ICD-10-CM | POA: Diagnosis not present

## 2020-01-25 DIAGNOSIS — Z992 Dependence on renal dialysis: Secondary | ICD-10-CM | POA: Diagnosis not present

## 2020-01-25 DIAGNOSIS — R52 Pain, unspecified: Secondary | ICD-10-CM | POA: Diagnosis not present

## 2020-01-25 DIAGNOSIS — E119 Type 2 diabetes mellitus without complications: Secondary | ICD-10-CM | POA: Diagnosis not present

## 2020-01-25 DIAGNOSIS — D689 Coagulation defect, unspecified: Secondary | ICD-10-CM | POA: Diagnosis not present

## 2020-01-27 DIAGNOSIS — R52 Pain, unspecified: Secondary | ICD-10-CM | POA: Diagnosis not present

## 2020-01-27 DIAGNOSIS — N186 End stage renal disease: Secondary | ICD-10-CM | POA: Diagnosis not present

## 2020-01-27 DIAGNOSIS — E119 Type 2 diabetes mellitus without complications: Secondary | ICD-10-CM | POA: Diagnosis not present

## 2020-01-27 DIAGNOSIS — Z992 Dependence on renal dialysis: Secondary | ICD-10-CM | POA: Diagnosis not present

## 2020-01-27 DIAGNOSIS — D689 Coagulation defect, unspecified: Secondary | ICD-10-CM | POA: Diagnosis not present

## 2020-01-27 DIAGNOSIS — N2581 Secondary hyperparathyroidism of renal origin: Secondary | ICD-10-CM | POA: Diagnosis not present

## 2020-01-28 DIAGNOSIS — Z89512 Acquired absence of left leg below knee: Secondary | ICD-10-CM | POA: Diagnosis not present

## 2020-01-30 DIAGNOSIS — R52 Pain, unspecified: Secondary | ICD-10-CM | POA: Diagnosis not present

## 2020-01-30 DIAGNOSIS — E119 Type 2 diabetes mellitus without complications: Secondary | ICD-10-CM | POA: Diagnosis not present

## 2020-01-30 DIAGNOSIS — D689 Coagulation defect, unspecified: Secondary | ICD-10-CM | POA: Diagnosis not present

## 2020-01-30 DIAGNOSIS — N186 End stage renal disease: Secondary | ICD-10-CM | POA: Diagnosis not present

## 2020-01-30 DIAGNOSIS — N2581 Secondary hyperparathyroidism of renal origin: Secondary | ICD-10-CM | POA: Diagnosis not present

## 2020-01-30 DIAGNOSIS — Z992 Dependence on renal dialysis: Secondary | ICD-10-CM | POA: Diagnosis not present

## 2020-02-01 DIAGNOSIS — R52 Pain, unspecified: Secondary | ICD-10-CM | POA: Diagnosis not present

## 2020-02-01 DIAGNOSIS — N2581 Secondary hyperparathyroidism of renal origin: Secondary | ICD-10-CM | POA: Diagnosis not present

## 2020-02-01 DIAGNOSIS — D689 Coagulation defect, unspecified: Secondary | ICD-10-CM | POA: Diagnosis not present

## 2020-02-01 DIAGNOSIS — E119 Type 2 diabetes mellitus without complications: Secondary | ICD-10-CM | POA: Diagnosis not present

## 2020-02-01 DIAGNOSIS — N186 End stage renal disease: Secondary | ICD-10-CM | POA: Diagnosis not present

## 2020-02-01 DIAGNOSIS — Z992 Dependence on renal dialysis: Secondary | ICD-10-CM | POA: Diagnosis not present

## 2020-02-02 DIAGNOSIS — H3554 Dystrophies primarily involving the retinal pigment epithelium: Secondary | ICD-10-CM | POA: Diagnosis not present

## 2020-02-02 DIAGNOSIS — E113491 Type 2 diabetes mellitus with severe nonproliferative diabetic retinopathy without macular edema, right eye: Secondary | ICD-10-CM | POA: Diagnosis not present

## 2020-02-03 DIAGNOSIS — D689 Coagulation defect, unspecified: Secondary | ICD-10-CM | POA: Diagnosis not present

## 2020-02-03 DIAGNOSIS — N2581 Secondary hyperparathyroidism of renal origin: Secondary | ICD-10-CM | POA: Diagnosis not present

## 2020-02-03 DIAGNOSIS — R52 Pain, unspecified: Secondary | ICD-10-CM | POA: Diagnosis not present

## 2020-02-03 DIAGNOSIS — N186 End stage renal disease: Secondary | ICD-10-CM | POA: Diagnosis not present

## 2020-02-03 DIAGNOSIS — Z992 Dependence on renal dialysis: Secondary | ICD-10-CM | POA: Diagnosis not present

## 2020-02-03 DIAGNOSIS — E119 Type 2 diabetes mellitus without complications: Secondary | ICD-10-CM | POA: Diagnosis not present

## 2020-02-06 DIAGNOSIS — N186 End stage renal disease: Secondary | ICD-10-CM | POA: Diagnosis not present

## 2020-02-06 DIAGNOSIS — Z992 Dependence on renal dialysis: Secondary | ICD-10-CM | POA: Diagnosis not present

## 2020-02-06 DIAGNOSIS — R52 Pain, unspecified: Secondary | ICD-10-CM | POA: Diagnosis not present

## 2020-02-06 DIAGNOSIS — N2581 Secondary hyperparathyroidism of renal origin: Secondary | ICD-10-CM | POA: Diagnosis not present

## 2020-02-06 DIAGNOSIS — E119 Type 2 diabetes mellitus without complications: Secondary | ICD-10-CM | POA: Diagnosis not present

## 2020-02-06 DIAGNOSIS — D689 Coagulation defect, unspecified: Secondary | ICD-10-CM | POA: Diagnosis not present

## 2020-02-08 DIAGNOSIS — I12 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 chronic kidney disease or end stage renal disease: Secondary | ICD-10-CM | POA: Diagnosis not present

## 2020-02-08 DIAGNOSIS — Z992 Dependence on renal dialysis: Secondary | ICD-10-CM | POA: Diagnosis not present

## 2020-02-08 DIAGNOSIS — N186 End stage renal disease: Secondary | ICD-10-CM | POA: Diagnosis not present

## 2020-02-09 ENCOUNTER — Ambulatory Visit: Payer: Medicare Other | Admitting: Orthotics

## 2020-02-09 ENCOUNTER — Ambulatory Visit: Payer: Medicare Other | Admitting: Podiatry

## 2020-02-09 DIAGNOSIS — N186 End stage renal disease: Secondary | ICD-10-CM | POA: Diagnosis not present

## 2020-02-09 DIAGNOSIS — T82858A Stenosis of vascular prosthetic devices, implants and grafts, initial encounter: Secondary | ICD-10-CM | POA: Diagnosis not present

## 2020-02-09 DIAGNOSIS — I871 Compression of vein: Secondary | ICD-10-CM | POA: Diagnosis not present

## 2020-02-09 DIAGNOSIS — Z992 Dependence on renal dialysis: Secondary | ICD-10-CM | POA: Diagnosis not present

## 2020-02-10 ENCOUNTER — Telehealth: Payer: Self-pay | Admitting: Physician Assistant

## 2020-02-10 DIAGNOSIS — E119 Type 2 diabetes mellitus without complications: Secondary | ICD-10-CM | POA: Diagnosis not present

## 2020-02-10 DIAGNOSIS — D689 Coagulation defect, unspecified: Secondary | ICD-10-CM | POA: Diagnosis not present

## 2020-02-10 DIAGNOSIS — N2581 Secondary hyperparathyroidism of renal origin: Secondary | ICD-10-CM | POA: Diagnosis not present

## 2020-02-10 DIAGNOSIS — Z992 Dependence on renal dialysis: Secondary | ICD-10-CM | POA: Diagnosis not present

## 2020-02-10 DIAGNOSIS — N186 End stage renal disease: Secondary | ICD-10-CM | POA: Diagnosis not present

## 2020-02-10 NOTE — Telephone Encounter (Signed)
Received a hem referral from Dr. Drema Dallas for Mr.  Faulkner to re-establish care for polycythemia vera. He has been cld and scheduled to see Cassie on 7/13 at 1:30pm w/labs at 1pm.

## 2020-02-11 DIAGNOSIS — D689 Coagulation defect, unspecified: Secondary | ICD-10-CM | POA: Diagnosis not present

## 2020-02-11 DIAGNOSIS — N2581 Secondary hyperparathyroidism of renal origin: Secondary | ICD-10-CM | POA: Diagnosis not present

## 2020-02-11 DIAGNOSIS — N186 End stage renal disease: Secondary | ICD-10-CM | POA: Diagnosis not present

## 2020-02-11 DIAGNOSIS — E119 Type 2 diabetes mellitus without complications: Secondary | ICD-10-CM | POA: Diagnosis not present

## 2020-02-11 DIAGNOSIS — Z992 Dependence on renal dialysis: Secondary | ICD-10-CM | POA: Diagnosis not present

## 2020-02-13 DIAGNOSIS — Z992 Dependence on renal dialysis: Secondary | ICD-10-CM | POA: Diagnosis not present

## 2020-02-13 DIAGNOSIS — N2581 Secondary hyperparathyroidism of renal origin: Secondary | ICD-10-CM | POA: Diagnosis not present

## 2020-02-13 DIAGNOSIS — D689 Coagulation defect, unspecified: Secondary | ICD-10-CM | POA: Diagnosis not present

## 2020-02-13 DIAGNOSIS — E119 Type 2 diabetes mellitus without complications: Secondary | ICD-10-CM | POA: Diagnosis not present

## 2020-02-13 DIAGNOSIS — N186 End stage renal disease: Secondary | ICD-10-CM | POA: Diagnosis not present

## 2020-02-15 DIAGNOSIS — D689 Coagulation defect, unspecified: Secondary | ICD-10-CM | POA: Diagnosis not present

## 2020-02-15 DIAGNOSIS — Z992 Dependence on renal dialysis: Secondary | ICD-10-CM | POA: Diagnosis not present

## 2020-02-15 DIAGNOSIS — N2581 Secondary hyperparathyroidism of renal origin: Secondary | ICD-10-CM | POA: Diagnosis not present

## 2020-02-15 DIAGNOSIS — E119 Type 2 diabetes mellitus without complications: Secondary | ICD-10-CM | POA: Diagnosis not present

## 2020-02-15 DIAGNOSIS — N186 End stage renal disease: Secondary | ICD-10-CM | POA: Diagnosis not present

## 2020-02-17 DIAGNOSIS — N186 End stage renal disease: Secondary | ICD-10-CM | POA: Diagnosis not present

## 2020-02-17 DIAGNOSIS — Z89512 Acquired absence of left leg below knee: Secondary | ICD-10-CM | POA: Diagnosis not present

## 2020-02-17 DIAGNOSIS — Z9189 Other specified personal risk factors, not elsewhere classified: Secondary | ICD-10-CM | POA: Diagnosis not present

## 2020-02-17 DIAGNOSIS — E119 Type 2 diabetes mellitus without complications: Secondary | ICD-10-CM | POA: Diagnosis not present

## 2020-02-17 DIAGNOSIS — L97512 Non-pressure chronic ulcer of other part of right foot with fat layer exposed: Secondary | ICD-10-CM | POA: Diagnosis not present

## 2020-02-17 DIAGNOSIS — N2581 Secondary hyperparathyroidism of renal origin: Secondary | ICD-10-CM | POA: Diagnosis not present

## 2020-02-17 DIAGNOSIS — D689 Coagulation defect, unspecified: Secondary | ICD-10-CM | POA: Diagnosis not present

## 2020-02-17 DIAGNOSIS — Z992 Dependence on renal dialysis: Secondary | ICD-10-CM | POA: Diagnosis not present

## 2020-02-20 DIAGNOSIS — D689 Coagulation defect, unspecified: Secondary | ICD-10-CM | POA: Diagnosis not present

## 2020-02-20 DIAGNOSIS — Z992 Dependence on renal dialysis: Secondary | ICD-10-CM | POA: Diagnosis not present

## 2020-02-20 DIAGNOSIS — E119 Type 2 diabetes mellitus without complications: Secondary | ICD-10-CM | POA: Diagnosis not present

## 2020-02-20 DIAGNOSIS — N2581 Secondary hyperparathyroidism of renal origin: Secondary | ICD-10-CM | POA: Diagnosis not present

## 2020-02-20 DIAGNOSIS — N186 End stage renal disease: Secondary | ICD-10-CM | POA: Diagnosis not present

## 2020-02-21 ENCOUNTER — Other Ambulatory Visit: Payer: Self-pay

## 2020-02-21 ENCOUNTER — Inpatient Hospital Stay: Payer: Medicare Other | Attending: Physician Assistant | Admitting: Physician Assistant

## 2020-02-21 ENCOUNTER — Inpatient Hospital Stay: Payer: Medicare Other

## 2020-02-21 ENCOUNTER — Other Ambulatory Visit: Payer: Self-pay | Admitting: Physician Assistant

## 2020-02-21 ENCOUNTER — Encounter: Payer: Self-pay | Admitting: Physician Assistant

## 2020-02-21 VITALS — BP 86/46 | HR 72 | Temp 97.7°F | Resp 18 | Ht 68.0 in

## 2020-02-21 DIAGNOSIS — M1990 Unspecified osteoarthritis, unspecified site: Secondary | ICD-10-CM | POA: Diagnosis not present

## 2020-02-21 DIAGNOSIS — I509 Heart failure, unspecified: Secondary | ICD-10-CM | POA: Insufficient documentation

## 2020-02-21 DIAGNOSIS — I132 Hypertensive heart and chronic kidney disease with heart failure and with stage 5 chronic kidney disease, or end stage renal disease: Secondary | ICD-10-CM | POA: Insufficient documentation

## 2020-02-21 DIAGNOSIS — D638 Anemia in other chronic diseases classified elsewhere: Secondary | ICD-10-CM | POA: Diagnosis not present

## 2020-02-21 DIAGNOSIS — D72819 Decreased white blood cell count, unspecified: Secondary | ICD-10-CM | POA: Insufficient documentation

## 2020-02-21 DIAGNOSIS — E1122 Type 2 diabetes mellitus with diabetic chronic kidney disease: Secondary | ICD-10-CM | POA: Insufficient documentation

## 2020-02-21 DIAGNOSIS — Z79899 Other long term (current) drug therapy: Secondary | ICD-10-CM | POA: Insufficient documentation

## 2020-02-21 DIAGNOSIS — I10 Essential (primary) hypertension: Secondary | ICD-10-CM | POA: Diagnosis not present

## 2020-02-21 DIAGNOSIS — R7989 Other specified abnormal findings of blood chemistry: Secondary | ICD-10-CM | POA: Insufficient documentation

## 2020-02-21 DIAGNOSIS — N186 End stage renal disease: Secondary | ICD-10-CM | POA: Insufficient documentation

## 2020-02-21 DIAGNOSIS — I429 Cardiomyopathy, unspecified: Secondary | ICD-10-CM | POA: Insufficient documentation

## 2020-02-21 DIAGNOSIS — N189 Chronic kidney disease, unspecified: Secondary | ICD-10-CM

## 2020-02-21 DIAGNOSIS — D631 Anemia in chronic kidney disease: Secondary | ICD-10-CM

## 2020-02-21 DIAGNOSIS — Z992 Dependence on renal dialysis: Secondary | ICD-10-CM | POA: Diagnosis not present

## 2020-02-21 DIAGNOSIS — D751 Secondary polycythemia: Secondary | ICD-10-CM | POA: Diagnosis not present

## 2020-02-21 LAB — CBC WITH DIFFERENTIAL (CANCER CENTER ONLY)
Abs Immature Granulocytes: 0.01 10*3/uL (ref 0.00–0.07)
Basophils Absolute: 0 10*3/uL (ref 0.0–0.1)
Basophils Relative: 1 %
Eosinophils Absolute: 0.1 10*3/uL (ref 0.0–0.5)
Eosinophils Relative: 2 %
HCT: 46 % (ref 39.0–52.0)
Hemoglobin: 15.3 g/dL (ref 13.0–17.0)
Immature Granulocytes: 0 %
Lymphocytes Relative: 31 %
Lymphs Abs: 1.2 10*3/uL (ref 0.7–4.0)
MCH: 36 pg — ABNORMAL HIGH (ref 26.0–34.0)
MCHC: 33.3 g/dL (ref 30.0–36.0)
MCV: 108.2 fL — ABNORMAL HIGH (ref 80.0–100.0)
Monocytes Absolute: 0.5 10*3/uL (ref 0.1–1.0)
Monocytes Relative: 12 %
Neutro Abs: 2 10*3/uL (ref 1.7–7.7)
Neutrophils Relative %: 54 %
Platelet Count: 214 10*3/uL (ref 150–400)
RBC: 4.25 MIL/uL (ref 4.22–5.81)
RDW: 14.5 % (ref 11.5–15.5)
WBC Count: 3.7 10*3/uL — ABNORMAL LOW (ref 4.0–10.5)
nRBC: 0 % (ref 0.0–0.2)

## 2020-02-21 LAB — VITAMIN B12: Vitamin B-12: 116 pg/mL — ABNORMAL LOW (ref 180–914)

## 2020-02-21 LAB — IRON AND TIBC
Iron: 82 ug/dL (ref 42–163)
Saturation Ratios: 40 % (ref 20–55)
TIBC: 205 ug/dL (ref 202–409)
UIBC: 123 ug/dL (ref 117–376)

## 2020-02-21 LAB — FERRITIN: Ferritin: 855 ng/mL — ABNORMAL HIGH (ref 24–336)

## 2020-02-21 LAB — FOLATE: Folate: 6.9 ng/mL (ref >5.9)

## 2020-02-21 NOTE — Progress Notes (Signed)
[facility] OFFICE PROGRESS NOTE  [name], MD [address] [place] [address]  DIAGNOSIS: Intermittent secondary polycythemia as well as anemia of chronic disease. Jak 2 negative.   PRIOR THERAPY: None  CURRENT THERAPY: None INTERVAL HISTORY: Timothy Faulkner 74 y.o. male with a past medical history significant for essential hypertension, diabetes mellitus, peripheral vascular disease, status post left BKA, osteomyelitis, congestive heart failure, end-stage renal disease on dialysis Monday, Wednesday, and Friday, and gangrene is seen in the clinic today for a follow-up visit.  The patient was initially seen in 2019 after routine labs noted polycythemia.  The patient had molecular studies performed to evaluate for primary polycythemia vera.  The patient's molecular studies were negative for evidence of myeloproliferative disorder/JAK2 mutation.  It is likely that his polycythemia is secondary.  Per chart review, it appears that the patient is also had intermittent periods of anemia, likely secondary to his CKD and chronic disease.  The patient was seen at dialysis recently and routine blood work noted recurrent polycythemia with a hemoglobin of 18.  The patient was instructed to return to the clinic for reevaluation and recommendations regarding this finding.  Overall, the patient feels well today without any concerning complaints.  He denies any fatigue.  He denies any abnormal bleeding or bruising.  He denies any headaches.  Denies any itching.  Denies any nosebleeding, gum bleeding, hemoptysis, hematemesis, melena, hematochezia, or hematuria. Denies chest pain, palpitations, or shortness of breath. He denies any nausea, vomiting, diarrhea, constipation, abdominal fullness, or unexplained weight loss.  He is here today for evaluation and routine blood work.  MEDICAL HISTORY: Past Medical History:  Diagnosis Date  . Anemia   . Arthritis    Gout  .  Automatic implantable cardioverter-defibrillator in situ    [employer]  . CHF   . ESRD on dialysis [facility])    [name] 03/11/2013 (03/11/2013) dialysis M/W/F  . Gangrene ([place])    left foot  . GERD (gastroesophageal reflux disease)   . Gout    "once a year"  . Heart murmur   . HYPERCHOLESTEROLEMIA, MIXED   . HYPERTENSION   . Macular degeneration   . Osteomyelitis ([place])    right foot  . 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 Pacemaker   . Peripheral arterial disease (HCC)    left fifth toe ulcer, healing  . Pneumonia   . Shortness of breath   . 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 Wears dentures   . Wears glasses     ALLERGIES:  has No Known Allergies.  MEDICATIONS:  Current Outpatient Medications  Medication Sig Dispense Refill  . aspirin EC 81 MG tablet Take 1 tablet (81 mg total) by mouth daily. 150 tablet 2  . calcitRIOL (ROCALTROL) 0.25 MCG capsule Take 0.25 mcg by mouth every Monday, Wednesday, and Friday.    . cinacalcet (SENSIPAR) 30 MG tablet Take 1 tablet (30 mg total) by mouth every other day. 60 tablet 0  . Methoxy PEG-Epoetin Beta (MIRCERA IJ) Mircera    . midodrine (PROAMATINE) 10 MG tablet Take 1 tablet (10 mg total) by mouth every Monday, Wednesday, and Friday with hemodialysis. 60 tablet 6  . RENVELA 800 MG tablet Take 2 tablets by mouth in the morning, at noon, and at bedtime.    Marland Kitchen acetaminophen (TYLENOL) 500 MG tablet Take 500 mg by mouth every 6 (six) hours as needed  for mild pain or headache. (Patient not taking: Reported on 02/21/2020)    . Amino Acids-Protein Hydrolys (FEEDING SUPPLEMENT, PRO-STAT SUGAR FREE 64,) LIQD Take 30 mLs by mouth 2 (two) times daily. (Patient not taking: Reported on 02/21/2020) 887 mL 0  . AURYXIA 1 GM 210 MG(Fe) tablet Take 210 mg by mouth 3 (three) times daily.  (Patient not taking: Reported on 02/21/2020)    . bisacodyl (DULCOLAX) 5 MG EC tablet Take 1 tablet (5 mg total) by  mouth daily as needed for moderate constipation. (Patient not taking: Reported on 02/21/2020) 30 tablet 0  . Calcium Carbonate Antacid (CALCIUM CARBONATE, DOSED IN MG ELEMENTAL CALCIUM,) 1250 MG/5ML SUSP Take 5 mLs (500 mg of elemental calcium total) by mouth every 6 (six) hours as needed for indigestion. (Patient not taking: Reported on 02/21/2020) 450 mL 0  . clopidogrel (PLAVIX) 75 MG tablet Take 1 tablet (75 mg total) by mouth daily with breakfast. (Patient not taking: Reported on 02/21/2020) 30 tablet 0  . colchicine 0.6 MG tablet Take 0.6 mg by mouth daily as needed (as directed for gout flares).  (Patient not taking: Reported on 02/21/2020)  1  . collagenase (SANTYL) ointment Apply topically daily. Apply as directed per home health nurse.  Apply to right foot wound edge to edge nickel thick.  Cover with wet gauze followed by dry gauze and change daily (Patient not taking: Reported on 02/21/2020) 15 g 0  . docusate sodium (COLACE) 100 MG capsule Take 2 capsules (200 mg total) by mouth 2 (two) times daily. (Patient not taking: Reported on 02/21/2020) 10 capsule 0  . HYDROcodone-acetaminophen (NORCO) 5-325 MG tablet Take 1 tablet by mouth every 6 (six) hours as needed for moderate pain. (Patient not taking: Reported on 02/21/2020) 12 tablet 0  . methocarbamol (ROBAXIN) 500 MG tablet Take 1 tablet (500 mg total) by mouth every 12 (twelve) hours as needed for muscle spasms. (Patient not taking: Reported on 02/21/2020) 60 tablet 0  . oxyCODONE (OXY IR/ROXICODONE) 5 MG immediate release tablet Take by mouth. (Patient not taking: Reported on 02/21/2020)    . rosuvastatin (CRESTOR) 10 MG tablet Take 1 tablet (10 mg total) by mouth daily at 6 PM. (Patient not taking: Reported on 02/21/2020) 30 tablet 0  . silver sulfADIAZINE (SILVADENE) 1 % cream Apply pea-sized amount to wound daily. (Patient not taking: Reported on 02/21/2020) 50 g 0   No current facility-administered medications for this visit.    SURGICAL  HISTORY:  Past Surgical History:  Procedure Laterality Date  . A/V FISTULAGRAM Left 07/20/2018   Procedure: A/V FISTULAGRAM;  Surgeon: [name], MD;  Location: [place] [facility];  Service: Cardiovascular;  Laterality: Left;  . A/V FISTULAGRAM N/A 07/15/2019   Procedure: A/V FISTULAGRAM - Left Arm;  Surgeon: Elam Dutch, MD;  Location: [place] [facility];  Service: Cardiovascular;  Laterality: N/A;  . ABDOMINAL AORTOGRAM N/A 02/25/2019   Procedure: ABDOMINAL AORTOGRAM;  Surgeon: Elam Dutch, MD;  Location: [facility] [place] [facility];  Service: Cardiovascular;  Laterality: N/A;  . ABDOMINAL AORTOGRAM W/LOWER EXTREMITY Bilateral 07/20/2018   Procedure: ABDOMINAL AORTOGRAM W/LOWER EXTREMITY;  Surgeon: [name], MD;  Location: [place];  Service: Cardiovascular;  Laterality: Bilateral;  . AMPUTATION Right 09/07/2018   Procedure: Right fifth metatarsectomy;  Surgeon: [name], DPM;  Location: [facility];  Service: Podiatry;  Laterality: Right;  . AMPUTATION Left 03/08/2019   Procedure: AMPUTATION  3RD AND 4TH TOES LEFT FOOT;  Surgeon: [name], DPM;  Location: [facility];  Service: Podiatry;  Laterality: Left;  . AMPUTATION Left 03/15/2019   Procedure: AMPUTATION BELOW KNEE;  Surgeon: Elam Dutch, MD;  Location: [facility] OR;  Service: Vascular;  Laterality: Left;  . AV FISTULA PLACEMENT Right 12/13/2012   Procedure: ARTERIOVENOUS (AV) FISTULA CREATION;  Surgeon: [name], MD;  Location: [place];  Service: Vascular;  Laterality: Right;  Ultrasound guided  . AV FISTULA PLACEMENT Left 05/07/2016   Procedure: LEFT RADIOCEPHALIC ARTERIOVENOUS (AV) FISTULA CREATION;  Surgeon: [name], MD;  Location: [facility] OR;  Service: Vascular;  Laterality: Left;  . AV FISTULA PLACEMENT Right 08/09/2019   Procedure: INSERTION OF RIGHT ARTERIOVENOUS (AV) 4-56mm GORE-TEX STRETCH GRAFT RIGHT  ARM;  Surgeon: Elam Dutch, MD;  Location: [place];  Service: Vascular;  Laterality: Right;  .  BASCILIC VEIN TRANSPOSITION Right 03/26/2016   Procedure: RIGHT BASILIC VEIN TRANSPOSITION;  Surgeon: [name], MD;  Location: [place];  Service: Vascular;  Laterality: Right;  . CARDIAC CATHETERIZATION    . CARDIAC DEFIBRILLATOR PLACEMENT     Chemical engineer  . CATARACT EXTRACTION W/PHACO Bilateral   . EYE SURGERY Bilateral    Cataract  . FISTULOGRAM Left 04/22/2018   Procedure: FISTULOGRAM UPPER EXTREMITY;  Surgeon: [name], MD;  Location: [place];  Service: Vascular;  Laterality: Left;  . I & D EXTREMITY Right 07/15/2018   Procedure: IRRIGATION AND DEBRIDEMENT RIGHT FOOT;  Surgeon: [name], DPM;  Location: [facility];  Service: Podiatry;  Laterality: Right;  . INCISION AND DRAINAGE ABSCESS / HEMATOMA OF BURSA / KNEE / THIGH Left 2012   "knee" (03/11/2013)  . INSERT / REPLACE / REMOVE PACEMAKER    . INSERTION OF DIALYSIS CATHETER Left 04/22/2018   Procedure: INSERTION OF DIALYSIS CATHETER;  Surgeon: [name], MD;  Location: [place];  Service: Vascular;  Laterality: Left;  . IR FLUORO GUIDE CV LINE LEFT  07/15/2018  . IR PTA VENOUS EXCEPT DIALYSIS CIRCUIT  07/15/2018  . LOWER EXTREMITY ANGIOGRAPHY Right 07/21/2018   Procedure: LOWER EXTREMITY ANGIOGRAPHY;  Surgeon: [name], MD;  Location: [facility] [facility];  Service: Cardiovascular;  Laterality: Right;  . LOWER EXTREMITY ANGIOGRAPHY Bilateral 02/25/2019   Procedure: Lower Extremity Angiography;  Surgeon: Elam Dutch, MD;  Location: [place] [facility];  Service: Cardiovascular;  Laterality: Bilateral;  . METATARSAL HEAD EXCISION Right 07/15/2018   Procedure: METATARSAL RESECTION;  Surgeon: [name], DPM;  Location: [place];  Service: Podiatry;  Laterality: Right;  . MULTIPLE TOOTH EXTRACTIONS    . PERIPHERAL VASCULAR ATHERECTOMY Right 02/28/2019   Procedure: PERIPHERAL VASCULAR ATHERECTOMY;  Surgeon: Waynetta Sandy, MD;  Location: [place] [facility];  Service: Cardiovascular;   Laterality: Right;  right tp trunk   . PERIPHERAL VASCULAR BALLOON ANGIOPLASTY Left 02/25/2019   Procedure: PERIPHERAL VASCULAR BALLOON ANGIOPLASTY;  Surgeon: Elam Dutch, MD;  Location: [place] [facility];  Service: Cardiovascular;  Laterality: Left;  tibial peroneal trunk and PT  . PERIPHERAL VASCULAR INTERVENTION Right 07/21/2018   Procedure: PERIPHERAL VASCULAR INTERVENTION;  Surgeon: [name], MD;  Location: [place] [facility];  Service: Cardiovascular;  Laterality: Right;  peroneal stents  . REVISON OF ARTERIOVENOUS FISTULA Right 7/56/4332   Procedure: Plication OF Right Arm ARTERIOVENOUS FISTULA;  Surgeon: Elam Dutch, MD;  Location: [facility] OR;  Service: Vascular;  Laterality: Right;  . REVISON OF ARTERIOVENOUS FISTULA Left 04/22/2018   Procedure:  REVISION OF RADIOCEPHALIC ARTERIOVENOUS FISTULA;  Surgeon: [name], MD;  Location: [place];  Service: Vascular;  Laterality: Left;  . SHUNTOGRAM N/A 05/31/2013   Procedure: Earney Mallet;  Surgeon: [name], MD;  Location: [facility] C[facility] LAB;  Service: Cardiovascular;  Laterality: N/A;  . UPPER EXTREMITY VENOGRAPHY Right 07/23/2018   Procedure: UPPER EXTREMITY VENOGRAPHY;  Surgeon: [name], MD;  Location: [place] [facility];  Service: Cardiovascular;  Laterality: Right;  . UPPER EXTREMITY VENOGRAPHY  07/15/2019   Procedure: UPPER EXTREMITY VENOGRAPHY;  Surgeon: Elam Dutch, MD;  Location: [place] [facility];  Service: Cardiovascular;;  rt arm   . WOUND DEBRIDEMENT Right 07/17/2018   Procedure: Wound Debridement; Closure Filleted toe flap Right Foot;  Surgeon: [name], DPM;  Location: [place];  Service: Podiatry;  Laterality: Right;  . WOUND DEBRIDEMENT Right 09/07/2018   Procedure: Debridement Right Foot Wound, application of wound vac;  Surgeon: [name], DPM;  Location: [place];  Service: Podiatry;  Laterality: Right;  . WOUND DEBRIDEMENT Right 03/08/2019   Procedure: DEBRIDEMENT WOUND  RIGHT FOOT;  Surgeon: [name], DPM;  Location: [place];  Service: Podiatry;  Laterality: Right;    REVIEW OF SYSTEMS:   Review of Systems  Constitutional: Negative for appetite change, chills, fatigue, fever and unexpected weight change.  HENT: Negative for mouth sores, nosebleeds, sore throat and trouble swallowing.   Eyes: Negative for eye problems and icterus.  Respiratory: Negative for cough, hemoptysis, shortness of breath and wheezing.   Cardiovascular: Negative for chest pain and leg swelling.  Gastrointestinal: Negative for abdominal pain, constipation, diarrhea, nausea and vomiting.  Genitourinary: Negative for bladder incontinence, difficulty urinating, dysuria, frequency and hematuria.   Musculoskeletal: Negative for back pain, gait problem, neck pain and neck stiffness.  Skin: Negative for itching and rash.  Neurological: Negative for dizziness, extremity weakness, gait problem, headaches, light-headedness and seizures.  Hematological: Negative for adenopathy. Does not bruise/bleed easily.  Psychiatric/Behavioral: Negative for confusion, depression and sleep disturbance. The patient is not nervous/anxious.     PHYSICAL EXAMINATION:  Blood pressure (!) 86/46, pulse 72, temperature 97.7 F (36.5 C), temperature source Temporal, resp. rate 18, height 5\' 8"  (1.727 m), SpO2 99 %.  ECOG PERFORMANCE STATUS: 0 - Asymptomatic  Physical Exam  Constitutional: Oriented to person, place, and time and well-developed, well-nourished, and in no distress.  HENT:  Head: Normocephalic and atraumatic.  Mouth/Throat: Oropharynx is clear and moist. No oropharyngeal exudate.  Eyes: Conjunctivae are normal. Right eye exhibits no discharge. Left eye exhibits no discharge. No scleral icterus.  Neck: Normal range of motion. Neck supple.  Cardiovascular: Normal rate, regular rhythm, normal heart sounds and intact distal pulses.   Pulmonary/Chest: Effort normal and breath sounds normal. No  respiratory distress. No wheezes. No rales.  Abdominal: Soft. Bowel sounds are normal. Exhibits no distension and no mass. There is no tenderness.  Musculoskeletal: Left BKA. Normal range of motion.  Lymphadenopathy:    No cervical adenopathy.  Neurological: Alert and oriented to person, place, and time. Exhibits normal muscle tone. Examined in the wheelchair.  Skin: Skin is warm and dry. No rash noted. Not diaphoretic. No erythema. No pallor.  Psychiatric: Mood, memory and judgment normal.  Vitals reviewed.  LABORATORY DATA: Lab Results  Component Value Date   WBC 3.7 (L) 02/21/2020   HGB 15.3 02/21/2020   HCT 46.0 02/21/2020   MCV 108.2 (H) 02/21/2020   PLT 214 02/21/2020  Chemistry      Component Value Date/Time   NA 135 08/09/2019 0711   K 4.8 08/09/2019 0711   CL 102 08/09/2019 0711   CO2 22 04/17/2019 0301   BUN 26 (H) 08/09/2019 0711   CREATININE 4.90 (H) 08/09/2019 0711   CREATININE 9.49 (HH) 05/11/2018 1219      Component Value Date/Time   CALCIUM 9.4 04/17/2019 0301   ALKPHOS 62 04/16/2019 0536   AST 38 04/16/2019 0536   AST 11 (L) 05/11/2018 1219   ALT 31 04/16/2019 0536   ALT 8 05/11/2018 1219   BILITOT 0.7 04/16/2019 0536   BILITOT 0.7 05/11/2018 1219       RADIOGRAPHIC STUDIES:  CUP PACEART REMOTE DEVICE CHECK  Result Date: 01/24/2020 Scheduled remote reviewed. Normal device function.  Next remote 91 days.  [place]    ASSESSMENT/PLAN:  This is a very pleasant 74 year old African-American male referred to the clinic for evaluation of polycythemia. He also has intermittent periods of anemia per chart review.   The patient was seen by Dr. Julien Nordmann today.  The patient repeat blood work with a CBC, ferritin, iron studies, B12, and folate.  The patient CBC was unremarkable except for some mild leukopenia.  His hemoglobin was WNL at 15.3 today.  The patient's other blood work demonstrated an elevated ferritin which is likely secondary to his  multiple medical problems/chronic disease.  His iron studies are unremarkable.  His vitamin B12 and folate is still pending at this time.  The patient had JAK2 mutation testing performed in the past which was negative for evidence of primary polycythemia vera.  Dr. Julien Nordmann believes that his polycythemia is likely secondary.  He does not recommend any further work-up or management at this time.  He recommends the patient continue on observation.  We will not schedule a follow-up appointment at this time but will be available to the patient on an as-needed basis.  The patient was advised to call immediately if he has any concerning symptoms in the interval. The patient voices understanding of current disease status and treatment options and is in agreement with the current care plan. All questions were answered. The patient knows to call the clinic with any problems, questions or concerns. We can certainly see the patient much sooner if necessary   No orders of the defined types were placed in this encounter.    Pasqualino Witherspoon L Dennard Vezina, PA-C 02/21/20  ADDENDUM: Hematology/Oncology Attending: I had a face-to-face encounter with the patient today.  I recommended his care plan.  This is a very pleasant 74 years old African-American male with end-stage renal disease and currently on hemodialysis on Monday, Wednesday and Friday.  The patient was seen 2 years ago for evaluation of polycythemia.  He has JAK2 mutation performed at that time that was unremarkable his polycythemia was felt to be reactive in nature. He was seen recently by his nephrologist Dr. Justin Mend and was recommended for him to have follow-up visit with [place] for evaluation of his condition. Repeat blood work performed recently by his nephrologist, primary care physician as well as here showed no concerning polycythemia and his hemoglobin and hematocrit has been within the normal range.  His iron studies unremarkable and ferritin level is  elevated as acute phase reactant. I recommended for the patient to continue his routine follow-up visit and evaluation by his primary care physician and nephrologist.  I do not see any need for intervention from our side at this point. We will be happy to  see the patient in the future if needed for any other concerning findings. The patient was also advised to call if he has any concerning issues.  Disclaimer: This note was dictated with voice recognition software. Similar sounding words can inadvertently be transcribed and may be missed upon review. Eilleen Kempf, MD 02/21/20

## 2020-02-22 ENCOUNTER — Telehealth: Payer: Self-pay

## 2020-02-22 DIAGNOSIS — E119 Type 2 diabetes mellitus without complications: Secondary | ICD-10-CM | POA: Diagnosis not present

## 2020-02-22 DIAGNOSIS — N2581 Secondary hyperparathyroidism of renal origin: Secondary | ICD-10-CM | POA: Diagnosis not present

## 2020-02-22 DIAGNOSIS — Z992 Dependence on renal dialysis: Secondary | ICD-10-CM | POA: Diagnosis not present

## 2020-02-22 DIAGNOSIS — N186 End stage renal disease: Secondary | ICD-10-CM | POA: Diagnosis not present

## 2020-02-22 DIAGNOSIS — D689 Coagulation defect, unspecified: Secondary | ICD-10-CM | POA: Diagnosis not present

## 2020-02-22 NOTE — Telephone Encounter (Signed)
Patient's daughter, Birdie Sons (on [employer]) returned call to office.   Informed daughter of patient's B12 level and the need for replacement.  Informed daughter that patient can take 1000 mcg B12 PO once weekly and that the B12 can be purchased OTC.  Also informed daughter that patient can follow-up with PCP regarding patient's B12 level.  Daughter verbalized understanding and agreed to relay information to patient.  Office visit note and labs sent to both Dr. [name] [phone]) and Dr. Justin Mend [phone]). Fax transmission receipt confirmed.

## 2020-02-22 NOTE — Telephone Encounter (Signed)
Called and left message for patient to call office regarding labs from visit on 02/21/20.   Per Cassie Heilingoepter, PA, patient's B12 is low and it is recommended that he takes either 1000 mcg PO once weekly or he will need to get B12 injections. Patient will need to follow-up with his PCP and his nephrologist, Dr. Justin Mend.

## 2020-02-24 DIAGNOSIS — Z992 Dependence on renal dialysis: Secondary | ICD-10-CM | POA: Diagnosis not present

## 2020-02-24 DIAGNOSIS — D689 Coagulation defect, unspecified: Secondary | ICD-10-CM | POA: Diagnosis not present

## 2020-02-24 DIAGNOSIS — N2581 Secondary hyperparathyroidism of renal origin: Secondary | ICD-10-CM | POA: Diagnosis not present

## 2020-02-24 DIAGNOSIS — E119 Type 2 diabetes mellitus without complications: Secondary | ICD-10-CM | POA: Diagnosis not present

## 2020-02-24 DIAGNOSIS — N186 End stage renal disease: Secondary | ICD-10-CM | POA: Diagnosis not present

## 2020-02-27 DIAGNOSIS — D689 Coagulation defect, unspecified: Secondary | ICD-10-CM | POA: Diagnosis not present

## 2020-02-27 DIAGNOSIS — E119 Type 2 diabetes mellitus without complications: Secondary | ICD-10-CM | POA: Diagnosis not present

## 2020-02-27 DIAGNOSIS — Z992 Dependence on renal dialysis: Secondary | ICD-10-CM | POA: Diagnosis not present

## 2020-02-27 DIAGNOSIS — N186 End stage renal disease: Secondary | ICD-10-CM | POA: Diagnosis not present

## 2020-02-27 DIAGNOSIS — N2581 Secondary hyperparathyroidism of renal origin: Secondary | ICD-10-CM | POA: Diagnosis not present

## 2020-02-27 DIAGNOSIS — Z89512 Acquired absence of left leg below knee: Secondary | ICD-10-CM | POA: Diagnosis not present

## 2020-02-29 DIAGNOSIS — Z992 Dependence on renal dialysis: Secondary | ICD-10-CM | POA: Diagnosis not present

## 2020-02-29 DIAGNOSIS — D689 Coagulation defect, unspecified: Secondary | ICD-10-CM | POA: Diagnosis not present

## 2020-02-29 DIAGNOSIS — E119 Type 2 diabetes mellitus without complications: Secondary | ICD-10-CM | POA: Diagnosis not present

## 2020-02-29 DIAGNOSIS — N186 End stage renal disease: Secondary | ICD-10-CM | POA: Diagnosis not present

## 2020-02-29 DIAGNOSIS — N2581 Secondary hyperparathyroidism of renal origin: Secondary | ICD-10-CM | POA: Diagnosis not present

## 2020-03-02 DIAGNOSIS — Z992 Dependence on renal dialysis: Secondary | ICD-10-CM | POA: Diagnosis not present

## 2020-03-02 DIAGNOSIS — N2581 Secondary hyperparathyroidism of renal origin: Secondary | ICD-10-CM | POA: Diagnosis not present

## 2020-03-02 DIAGNOSIS — D689 Coagulation defect, unspecified: Secondary | ICD-10-CM | POA: Diagnosis not present

## 2020-03-02 DIAGNOSIS — N186 End stage renal disease: Secondary | ICD-10-CM | POA: Diagnosis not present

## 2020-03-02 DIAGNOSIS — E119 Type 2 diabetes mellitus without complications: Secondary | ICD-10-CM | POA: Diagnosis not present

## 2020-03-05 DIAGNOSIS — D689 Coagulation defect, unspecified: Secondary | ICD-10-CM | POA: Diagnosis not present

## 2020-03-05 DIAGNOSIS — N186 End stage renal disease: Secondary | ICD-10-CM | POA: Diagnosis not present

## 2020-03-05 DIAGNOSIS — E119 Type 2 diabetes mellitus without complications: Secondary | ICD-10-CM | POA: Diagnosis not present

## 2020-03-05 DIAGNOSIS — N2581 Secondary hyperparathyroidism of renal origin: Secondary | ICD-10-CM | POA: Diagnosis not present

## 2020-03-05 DIAGNOSIS — Z992 Dependence on renal dialysis: Secondary | ICD-10-CM | POA: Diagnosis not present

## 2020-03-07 DIAGNOSIS — E119 Type 2 diabetes mellitus without complications: Secondary | ICD-10-CM | POA: Diagnosis not present

## 2020-03-07 DIAGNOSIS — N2581 Secondary hyperparathyroidism of renal origin: Secondary | ICD-10-CM | POA: Diagnosis not present

## 2020-03-07 DIAGNOSIS — N186 End stage renal disease: Secondary | ICD-10-CM | POA: Diagnosis not present

## 2020-03-07 DIAGNOSIS — Z992 Dependence on renal dialysis: Secondary | ICD-10-CM | POA: Diagnosis not present

## 2020-03-07 DIAGNOSIS — D689 Coagulation defect, unspecified: Secondary | ICD-10-CM | POA: Diagnosis not present

## 2020-03-08 ENCOUNTER — Ambulatory Visit (INDEPENDENT_AMBULATORY_CARE_PROVIDER_SITE_OTHER): Payer: Medicare Other | Admitting: Podiatry

## 2020-03-08 ENCOUNTER — Other Ambulatory Visit: Payer: Self-pay

## 2020-03-08 ENCOUNTER — Ambulatory Visit (INDEPENDENT_AMBULATORY_CARE_PROVIDER_SITE_OTHER): Payer: Medicare Other | Admitting: Orthotics

## 2020-03-08 DIAGNOSIS — E1169 Type 2 diabetes mellitus with other specified complication: Secondary | ICD-10-CM

## 2020-03-08 DIAGNOSIS — L84 Corns and callosities: Secondary | ICD-10-CM

## 2020-03-08 DIAGNOSIS — I739 Peripheral vascular disease, unspecified: Secondary | ICD-10-CM

## 2020-03-08 DIAGNOSIS — L97511 Non-pressure chronic ulcer of other part of right foot limited to breakdown of skin: Secondary | ICD-10-CM | POA: Diagnosis not present

## 2020-03-08 DIAGNOSIS — E1151 Type 2 diabetes mellitus with diabetic peripheral angiopathy without gangrene: Secondary | ICD-10-CM

## 2020-03-08 DIAGNOSIS — B351 Tinea unguium: Secondary | ICD-10-CM | POA: Diagnosis not present

## 2020-03-08 NOTE — Progress Notes (Signed)
  Subjective:  Patient ID: NIC LAMPE, male    DOB: 12-09-1945,  MRN: [id number]  Chief Complaint  Patient presents with  . Nail Problem    pt is here for diabetic foot care. pt is also here for a nail trim    74 y.o. male presents with the above complaint. History confirmed with patient.   Objective:  Physical Exam: warm, good capillary refill, nail exam onychomycosis of the toenails, no trophic changes or ulcerative lesions. DP pulses palpable and protective sensation absent.  History of previous ulceration right lateral foot without open ulceration Hx left BKA, partial fifth ray amputation right foot  No images are attached to the encounter.  Assessment:   1. Onychomycosis of multiple toenails with type 2 diabetes mellitus and peripheral angiopathy ([place])   2. Diabetes mellitus type 2 with peripheral artery disease (HCC)   3. Callus      Plan:  Patient was evaluated and treated and all questions answered.  Onychomycosis, Diabetes and PAD -Patient is diabetic with a qualifying condition for at risk foot care.   Procedure: Nail Debridement Rationale: Patient meets criteria for routine foot care due to PAD Type of Debridement: manual, sharp debridement. Instrumentation: Nail nipper, rotary burr. Number of Nails: 4  History of ulcer lateral foot -Hyperkeratosis debrided no open ulceration noted  Procedure: Paring of Lesion Rationale: painful hyperkeratotic lesion Type of Debridement: manual, sharp debridement. Instrumentation: 312 blade, tissue nipper Number of Lesions: 1     No follow-ups on file.

## 2020-03-09 DIAGNOSIS — Z992 Dependence on renal dialysis: Secondary | ICD-10-CM | POA: Diagnosis not present

## 2020-03-09 DIAGNOSIS — N186 End stage renal disease: Secondary | ICD-10-CM | POA: Diagnosis not present

## 2020-03-09 DIAGNOSIS — N2581 Secondary hyperparathyroidism of renal origin: Secondary | ICD-10-CM | POA: Diagnosis not present

## 2020-03-09 DIAGNOSIS — D689 Coagulation defect, unspecified: Secondary | ICD-10-CM | POA: Diagnosis not present

## 2020-03-09 DIAGNOSIS — E119 Type 2 diabetes mellitus without complications: Secondary | ICD-10-CM | POA: Diagnosis not present

## 2020-03-10 DIAGNOSIS — I12 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 chronic kidney disease or end stage renal disease: Secondary | ICD-10-CM | POA: Diagnosis not present

## 2020-03-10 DIAGNOSIS — Z992 Dependence on renal dialysis: Secondary | ICD-10-CM | POA: Diagnosis not present

## 2020-03-10 DIAGNOSIS — N186 End stage renal disease: Secondary | ICD-10-CM | POA: Diagnosis not present

## 2020-03-12 DIAGNOSIS — N2581 Secondary hyperparathyroidism of renal origin: Secondary | ICD-10-CM | POA: Diagnosis not present

## 2020-03-12 DIAGNOSIS — R52 Pain, unspecified: Secondary | ICD-10-CM | POA: Diagnosis not present

## 2020-03-12 DIAGNOSIS — E119 Type 2 diabetes mellitus without complications: Secondary | ICD-10-CM | POA: Diagnosis not present

## 2020-03-12 DIAGNOSIS — Z992 Dependence on renal dialysis: Secondary | ICD-10-CM | POA: Diagnosis not present

## 2020-03-12 DIAGNOSIS — D689 Coagulation defect, unspecified: Secondary | ICD-10-CM | POA: Diagnosis not present

## 2020-03-12 DIAGNOSIS — N186 End stage renal disease: Secondary | ICD-10-CM | POA: Diagnosis not present

## 2020-03-14 DIAGNOSIS — R52 Pain, unspecified: Secondary | ICD-10-CM | POA: Diagnosis not present

## 2020-03-14 DIAGNOSIS — N186 End stage renal disease: Secondary | ICD-10-CM | POA: Diagnosis not present

## 2020-03-14 DIAGNOSIS — E119 Type 2 diabetes mellitus without complications: Secondary | ICD-10-CM | POA: Diagnosis not present

## 2020-03-14 DIAGNOSIS — N2581 Secondary hyperparathyroidism of renal origin: Secondary | ICD-10-CM | POA: Diagnosis not present

## 2020-03-14 DIAGNOSIS — Z992 Dependence on renal dialysis: Secondary | ICD-10-CM | POA: Diagnosis not present

## 2020-03-14 DIAGNOSIS — D689 Coagulation defect, unspecified: Secondary | ICD-10-CM | POA: Diagnosis not present

## 2020-03-16 DIAGNOSIS — N186 End stage renal disease: Secondary | ICD-10-CM | POA: Diagnosis not present

## 2020-03-16 DIAGNOSIS — Z992 Dependence on renal dialysis: Secondary | ICD-10-CM | POA: Diagnosis not present

## 2020-03-16 DIAGNOSIS — E119 Type 2 diabetes mellitus without complications: Secondary | ICD-10-CM | POA: Diagnosis not present

## 2020-03-16 DIAGNOSIS — R52 Pain, unspecified: Secondary | ICD-10-CM | POA: Diagnosis not present

## 2020-03-16 DIAGNOSIS — D689 Coagulation defect, unspecified: Secondary | ICD-10-CM | POA: Diagnosis not present

## 2020-03-16 DIAGNOSIS — N2581 Secondary hyperparathyroidism of renal origin: Secondary | ICD-10-CM | POA: Diagnosis not present

## 2020-03-19 DIAGNOSIS — N2581 Secondary hyperparathyroidism of renal origin: Secondary | ICD-10-CM | POA: Diagnosis not present

## 2020-03-19 DIAGNOSIS — E119 Type 2 diabetes mellitus without complications: Secondary | ICD-10-CM | POA: Diagnosis not present

## 2020-03-19 DIAGNOSIS — L97512 Non-pressure chronic ulcer of other part of right foot with fat layer exposed: Secondary | ICD-10-CM | POA: Diagnosis not present

## 2020-03-19 DIAGNOSIS — Z9189 Other specified personal risk factors, not elsewhere classified: Secondary | ICD-10-CM | POA: Diagnosis not present

## 2020-03-19 DIAGNOSIS — R52 Pain, unspecified: Secondary | ICD-10-CM | POA: Diagnosis not present

## 2020-03-19 DIAGNOSIS — Z992 Dependence on renal dialysis: Secondary | ICD-10-CM | POA: Diagnosis not present

## 2020-03-19 DIAGNOSIS — D689 Coagulation defect, unspecified: Secondary | ICD-10-CM | POA: Diagnosis not present

## 2020-03-19 DIAGNOSIS — Z89512 Acquired absence of left leg below knee: Secondary | ICD-10-CM | POA: Diagnosis not present

## 2020-03-19 DIAGNOSIS — N186 End stage renal disease: Secondary | ICD-10-CM | POA: Diagnosis not present

## 2020-03-20 ENCOUNTER — Encounter (INDEPENDENT_AMBULATORY_CARE_PROVIDER_SITE_OTHER): Payer: Medicare Other | Admitting: Ophthalmology

## 2020-03-21 DIAGNOSIS — E119 Type 2 diabetes mellitus without complications: Secondary | ICD-10-CM | POA: Diagnosis not present

## 2020-03-21 DIAGNOSIS — Z992 Dependence on renal dialysis: Secondary | ICD-10-CM | POA: Diagnosis not present

## 2020-03-21 DIAGNOSIS — R52 Pain, unspecified: Secondary | ICD-10-CM | POA: Diagnosis not present

## 2020-03-21 DIAGNOSIS — N186 End stage renal disease: Secondary | ICD-10-CM | POA: Diagnosis not present

## 2020-03-21 DIAGNOSIS — D689 Coagulation defect, unspecified: Secondary | ICD-10-CM | POA: Diagnosis not present

## 2020-03-21 DIAGNOSIS — N2581 Secondary hyperparathyroidism of renal origin: Secondary | ICD-10-CM | POA: Diagnosis not present

## 2020-03-23 DIAGNOSIS — Z992 Dependence on renal dialysis: Secondary | ICD-10-CM | POA: Diagnosis not present

## 2020-03-23 DIAGNOSIS — R52 Pain, unspecified: Secondary | ICD-10-CM | POA: Diagnosis not present

## 2020-03-23 DIAGNOSIS — N2581 Secondary hyperparathyroidism of renal origin: Secondary | ICD-10-CM | POA: Diagnosis not present

## 2020-03-23 DIAGNOSIS — D689 Coagulation defect, unspecified: Secondary | ICD-10-CM | POA: Diagnosis not present

## 2020-03-23 DIAGNOSIS — E119 Type 2 diabetes mellitus without complications: Secondary | ICD-10-CM | POA: Diagnosis not present

## 2020-03-23 DIAGNOSIS — N186 End stage renal disease: Secondary | ICD-10-CM | POA: Diagnosis not present

## 2020-03-26 DIAGNOSIS — N2581 Secondary hyperparathyroidism of renal origin: Secondary | ICD-10-CM | POA: Diagnosis not present

## 2020-03-26 DIAGNOSIS — N186 End stage renal disease: Secondary | ICD-10-CM | POA: Diagnosis not present

## 2020-03-26 DIAGNOSIS — D689 Coagulation defect, unspecified: Secondary | ICD-10-CM | POA: Diagnosis not present

## 2020-03-26 DIAGNOSIS — Z992 Dependence on renal dialysis: Secondary | ICD-10-CM | POA: Diagnosis not present

## 2020-03-26 DIAGNOSIS — E119 Type 2 diabetes mellitus without complications: Secondary | ICD-10-CM | POA: Diagnosis not present

## 2020-03-26 DIAGNOSIS — R52 Pain, unspecified: Secondary | ICD-10-CM | POA: Diagnosis not present

## 2020-03-28 DIAGNOSIS — Z992 Dependence on renal dialysis: Secondary | ICD-10-CM | POA: Diagnosis not present

## 2020-03-28 DIAGNOSIS — N186 End stage renal disease: Secondary | ICD-10-CM | POA: Diagnosis not present

## 2020-03-28 DIAGNOSIS — D689 Coagulation defect, unspecified: Secondary | ICD-10-CM | POA: Diagnosis not present

## 2020-03-28 DIAGNOSIS — E119 Type 2 diabetes mellitus without complications: Secondary | ICD-10-CM | POA: Diagnosis not present

## 2020-03-28 DIAGNOSIS — N2581 Secondary hyperparathyroidism of renal origin: Secondary | ICD-10-CM | POA: Diagnosis not present

## 2020-03-28 DIAGNOSIS — R52 Pain, unspecified: Secondary | ICD-10-CM | POA: Diagnosis not present

## 2020-03-29 DIAGNOSIS — Z89512 Acquired absence of left leg below knee: Secondary | ICD-10-CM | POA: Diagnosis not present

## 2020-03-30 DIAGNOSIS — D689 Coagulation defect, unspecified: Secondary | ICD-10-CM | POA: Diagnosis not present

## 2020-03-30 DIAGNOSIS — Z992 Dependence on renal dialysis: Secondary | ICD-10-CM | POA: Diagnosis not present

## 2020-03-30 DIAGNOSIS — E119 Type 2 diabetes mellitus without complications: Secondary | ICD-10-CM | POA: Diagnosis not present

## 2020-03-30 DIAGNOSIS — N2581 Secondary hyperparathyroidism of renal origin: Secondary | ICD-10-CM | POA: Diagnosis not present

## 2020-03-30 DIAGNOSIS — R52 Pain, unspecified: Secondary | ICD-10-CM | POA: Diagnosis not present

## 2020-03-30 DIAGNOSIS — N186 End stage renal disease: Secondary | ICD-10-CM | POA: Diagnosis not present

## 2020-04-02 DIAGNOSIS — D689 Coagulation defect, unspecified: Secondary | ICD-10-CM | POA: Diagnosis not present

## 2020-04-02 DIAGNOSIS — N2581 Secondary hyperparathyroidism of renal origin: Secondary | ICD-10-CM | POA: Diagnosis not present

## 2020-04-02 DIAGNOSIS — N186 End stage renal disease: Secondary | ICD-10-CM | POA: Diagnosis not present

## 2020-04-02 DIAGNOSIS — E119 Type 2 diabetes mellitus without complications: Secondary | ICD-10-CM | POA: Diagnosis not present

## 2020-04-02 DIAGNOSIS — R52 Pain, unspecified: Secondary | ICD-10-CM | POA: Diagnosis not present

## 2020-04-02 DIAGNOSIS — Z992 Dependence on renal dialysis: Secondary | ICD-10-CM | POA: Diagnosis not present

## 2020-04-04 DIAGNOSIS — N2581 Secondary hyperparathyroidism of renal origin: Secondary | ICD-10-CM | POA: Diagnosis not present

## 2020-04-04 DIAGNOSIS — E119 Type 2 diabetes mellitus without complications: Secondary | ICD-10-CM | POA: Diagnosis not present

## 2020-04-04 DIAGNOSIS — Z992 Dependence on renal dialysis: Secondary | ICD-10-CM | POA: Diagnosis not present

## 2020-04-04 DIAGNOSIS — N186 End stage renal disease: Secondary | ICD-10-CM | POA: Diagnosis not present

## 2020-04-04 DIAGNOSIS — R52 Pain, unspecified: Secondary | ICD-10-CM | POA: Diagnosis not present

## 2020-04-04 DIAGNOSIS — D689 Coagulation defect, unspecified: Secondary | ICD-10-CM | POA: Diagnosis not present

## 2020-04-05 DIAGNOSIS — T82858A Stenosis of vascular prosthetic devices, implants and grafts, initial encounter: Secondary | ICD-10-CM | POA: Diagnosis not present

## 2020-04-05 DIAGNOSIS — Z992 Dependence on renal dialysis: Secondary | ICD-10-CM | POA: Diagnosis not present

## 2020-04-05 DIAGNOSIS — N186 End stage renal disease: Secondary | ICD-10-CM | POA: Diagnosis not present

## 2020-04-05 DIAGNOSIS — I871 Compression of vein: Secondary | ICD-10-CM | POA: Diagnosis not present

## 2020-04-06 DIAGNOSIS — N2581 Secondary hyperparathyroidism of renal origin: Secondary | ICD-10-CM | POA: Diagnosis not present

## 2020-04-06 DIAGNOSIS — Z992 Dependence on renal dialysis: Secondary | ICD-10-CM | POA: Diagnosis not present

## 2020-04-06 DIAGNOSIS — E119 Type 2 diabetes mellitus without complications: Secondary | ICD-10-CM | POA: Diagnosis not present

## 2020-04-06 DIAGNOSIS — N186 End stage renal disease: Secondary | ICD-10-CM | POA: Diagnosis not present

## 2020-04-06 DIAGNOSIS — D689 Coagulation defect, unspecified: Secondary | ICD-10-CM | POA: Diagnosis not present

## 2020-04-06 DIAGNOSIS — R52 Pain, unspecified: Secondary | ICD-10-CM | POA: Diagnosis not present

## 2020-04-09 DIAGNOSIS — Z992 Dependence on renal dialysis: Secondary | ICD-10-CM | POA: Diagnosis not present

## 2020-04-09 DIAGNOSIS — N2581 Secondary hyperparathyroidism of renal origin: Secondary | ICD-10-CM | POA: Diagnosis not present

## 2020-04-09 DIAGNOSIS — E119 Type 2 diabetes mellitus without complications: Secondary | ICD-10-CM | POA: Diagnosis not present

## 2020-04-09 DIAGNOSIS — D689 Coagulation defect, unspecified: Secondary | ICD-10-CM | POA: Diagnosis not present

## 2020-04-09 DIAGNOSIS — N186 End stage renal disease: Secondary | ICD-10-CM | POA: Diagnosis not present

## 2020-04-09 DIAGNOSIS — R52 Pain, unspecified: Secondary | ICD-10-CM | POA: Diagnosis not present

## 2020-04-10 DIAGNOSIS — I12 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 chronic kidney disease or end stage renal disease: Secondary | ICD-10-CM | POA: Diagnosis not present

## 2020-04-10 DIAGNOSIS — N186 End stage renal disease: Secondary | ICD-10-CM | POA: Diagnosis not present

## 2020-04-10 DIAGNOSIS — Z992 Dependence on renal dialysis: Secondary | ICD-10-CM | POA: Diagnosis not present

## 2020-04-11 DIAGNOSIS — N186 End stage renal disease: Secondary | ICD-10-CM | POA: Diagnosis not present

## 2020-04-11 DIAGNOSIS — T8743 Infection of amputation stump, right lower extremity: Secondary | ICD-10-CM | POA: Diagnosis not present

## 2020-04-11 DIAGNOSIS — Z992 Dependence on renal dialysis: Secondary | ICD-10-CM | POA: Diagnosis not present

## 2020-04-11 DIAGNOSIS — A499 Bacterial infection, unspecified: Secondary | ICD-10-CM | POA: Diagnosis not present

## 2020-04-11 DIAGNOSIS — R52 Pain, unspecified: Secondary | ICD-10-CM | POA: Diagnosis not present

## 2020-04-11 DIAGNOSIS — N2581 Secondary hyperparathyroidism of renal origin: Secondary | ICD-10-CM | POA: Diagnosis not present

## 2020-04-11 DIAGNOSIS — D689 Coagulation defect, unspecified: Secondary | ICD-10-CM | POA: Diagnosis not present

## 2020-04-11 DIAGNOSIS — E119 Type 2 diabetes mellitus without complications: Secondary | ICD-10-CM | POA: Diagnosis not present

## 2020-04-13 DIAGNOSIS — T8743 Infection of amputation stump, right lower extremity: Secondary | ICD-10-CM | POA: Diagnosis not present

## 2020-04-13 DIAGNOSIS — A499 Bacterial infection, unspecified: Secondary | ICD-10-CM | POA: Diagnosis not present

## 2020-04-13 DIAGNOSIS — N186 End stage renal disease: Secondary | ICD-10-CM | POA: Diagnosis not present

## 2020-04-13 DIAGNOSIS — Z992 Dependence on renal dialysis: Secondary | ICD-10-CM | POA: Diagnosis not present

## 2020-04-13 DIAGNOSIS — N2581 Secondary hyperparathyroidism of renal origin: Secondary | ICD-10-CM | POA: Diagnosis not present

## 2020-04-13 DIAGNOSIS — E119 Type 2 diabetes mellitus without complications: Secondary | ICD-10-CM | POA: Diagnosis not present

## 2020-04-13 DIAGNOSIS — D689 Coagulation defect, unspecified: Secondary | ICD-10-CM | POA: Diagnosis not present

## 2020-04-13 DIAGNOSIS — R52 Pain, unspecified: Secondary | ICD-10-CM | POA: Diagnosis not present

## 2020-04-16 DIAGNOSIS — N2581 Secondary hyperparathyroidism of renal origin: Secondary | ICD-10-CM | POA: Diagnosis not present

## 2020-04-16 DIAGNOSIS — E119 Type 2 diabetes mellitus without complications: Secondary | ICD-10-CM | POA: Diagnosis not present

## 2020-04-16 DIAGNOSIS — R52 Pain, unspecified: Secondary | ICD-10-CM | POA: Diagnosis not present

## 2020-04-16 DIAGNOSIS — Z992 Dependence on renal dialysis: Secondary | ICD-10-CM | POA: Diagnosis not present

## 2020-04-16 DIAGNOSIS — T8743 Infection of amputation stump, right lower extremity: Secondary | ICD-10-CM | POA: Diagnosis not present

## 2020-04-16 DIAGNOSIS — N186 End stage renal disease: Secondary | ICD-10-CM | POA: Diagnosis not present

## 2020-04-16 DIAGNOSIS — A499 Bacterial infection, unspecified: Secondary | ICD-10-CM | POA: Diagnosis not present

## 2020-04-16 DIAGNOSIS — D689 Coagulation defect, unspecified: Secondary | ICD-10-CM | POA: Diagnosis not present

## 2020-04-18 DIAGNOSIS — D689 Coagulation defect, unspecified: Secondary | ICD-10-CM | POA: Diagnosis not present

## 2020-04-18 DIAGNOSIS — T8743 Infection of amputation stump, right lower extremity: Secondary | ICD-10-CM | POA: Diagnosis not present

## 2020-04-18 DIAGNOSIS — Z992 Dependence on renal dialysis: Secondary | ICD-10-CM | POA: Diagnosis not present

## 2020-04-18 DIAGNOSIS — R52 Pain, unspecified: Secondary | ICD-10-CM | POA: Diagnosis not present

## 2020-04-18 DIAGNOSIS — N186 End stage renal disease: Secondary | ICD-10-CM | POA: Diagnosis not present

## 2020-04-18 DIAGNOSIS — A499 Bacterial infection, unspecified: Secondary | ICD-10-CM | POA: Diagnosis not present

## 2020-04-18 DIAGNOSIS — N2581 Secondary hyperparathyroidism of renal origin: Secondary | ICD-10-CM | POA: Diagnosis not present

## 2020-04-18 DIAGNOSIS — E119 Type 2 diabetes mellitus without complications: Secondary | ICD-10-CM | POA: Diagnosis not present

## 2020-04-19 DIAGNOSIS — Z9189 Other specified personal risk factors, not elsewhere classified: Secondary | ICD-10-CM | POA: Diagnosis not present

## 2020-04-19 DIAGNOSIS — Z89512 Acquired absence of left leg below knee: Secondary | ICD-10-CM | POA: Diagnosis not present

## 2020-04-19 DIAGNOSIS — L97512 Non-pressure chronic ulcer of other part of right foot with fat layer exposed: Secondary | ICD-10-CM | POA: Diagnosis not present

## 2020-04-20 DIAGNOSIS — T8743 Infection of amputation stump, right lower extremity: Secondary | ICD-10-CM | POA: Diagnosis not present

## 2020-04-20 DIAGNOSIS — N2581 Secondary hyperparathyroidism of renal origin: Secondary | ICD-10-CM | POA: Diagnosis not present

## 2020-04-20 DIAGNOSIS — R52 Pain, unspecified: Secondary | ICD-10-CM | POA: Diagnosis not present

## 2020-04-20 DIAGNOSIS — Z992 Dependence on renal dialysis: Secondary | ICD-10-CM | POA: Diagnosis not present

## 2020-04-20 DIAGNOSIS — E119 Type 2 diabetes mellitus without complications: Secondary | ICD-10-CM | POA: Diagnosis not present

## 2020-04-20 DIAGNOSIS — N186 End stage renal disease: Secondary | ICD-10-CM | POA: Diagnosis not present

## 2020-04-20 DIAGNOSIS — D689 Coagulation defect, unspecified: Secondary | ICD-10-CM | POA: Diagnosis not present

## 2020-04-20 DIAGNOSIS — A499 Bacterial infection, unspecified: Secondary | ICD-10-CM | POA: Diagnosis not present

## 2020-04-23 ENCOUNTER — Ambulatory Visit (INDEPENDENT_AMBULATORY_CARE_PROVIDER_SITE_OTHER): Payer: Medicare Other | Admitting: *Deleted

## 2020-04-23 DIAGNOSIS — N186 End stage renal disease: Secondary | ICD-10-CM | POA: Diagnosis not present

## 2020-04-23 DIAGNOSIS — I420 Dilated cardiomyopathy: Secondary | ICD-10-CM

## 2020-04-23 DIAGNOSIS — Z992 Dependence on renal dialysis: Secondary | ICD-10-CM | POA: Diagnosis not present

## 2020-04-23 DIAGNOSIS — A499 Bacterial infection, unspecified: Secondary | ICD-10-CM | POA: Diagnosis not present

## 2020-04-23 DIAGNOSIS — T8743 Infection of amputation stump, right lower extremity: Secondary | ICD-10-CM | POA: Diagnosis not present

## 2020-04-23 DIAGNOSIS — D689 Coagulation defect, unspecified: Secondary | ICD-10-CM | POA: Diagnosis not present

## 2020-04-23 DIAGNOSIS — E119 Type 2 diabetes mellitus without complications: Secondary | ICD-10-CM | POA: Diagnosis not present

## 2020-04-23 DIAGNOSIS — N2581 Secondary hyperparathyroidism of renal origin: Secondary | ICD-10-CM | POA: Diagnosis not present

## 2020-04-23 DIAGNOSIS — R52 Pain, unspecified: Secondary | ICD-10-CM | POA: Diagnosis not present

## 2020-04-24 ENCOUNTER — Encounter (INDEPENDENT_AMBULATORY_CARE_PROVIDER_SITE_OTHER): Payer: Medicare Other | Admitting: Ophthalmology

## 2020-04-24 ENCOUNTER — Other Ambulatory Visit: Payer: Self-pay

## 2020-04-24 ENCOUNTER — Ambulatory Visit (INDEPENDENT_AMBULATORY_CARE_PROVIDER_SITE_OTHER): Payer: Medicare Other | Admitting: Podiatry

## 2020-04-24 ENCOUNTER — Encounter: Payer: Self-pay | Admitting: Podiatry

## 2020-04-24 DIAGNOSIS — I739 Peripheral vascular disease, unspecified: Secondary | ICD-10-CM | POA: Diagnosis not present

## 2020-04-24 DIAGNOSIS — S90221A Contusion of right lesser toe(s) with damage to nail, initial encounter: Secondary | ICD-10-CM | POA: Diagnosis not present

## 2020-04-24 NOTE — Progress Notes (Signed)
  Subjective:  Patient ID: Timothy Faulkner, male    DOB: [birth year],  MRN: [id number]  No chief complaint on file.   74 y.o. male presents with concern of right 4th toenail discoloration. States it is dark under the nail. Deneis injury. Occurred Saturday. Denies redness and swelling. Denies prior treatment.  Objective:  Physical Exam: warm, good capillary refill, nail exam onychomycosis of the toenails, no trophic changes or ulcerative lesions. DP pulses palpable and protective sensation absent.  History of previous ulceration right lateral foot without open ulceration Hx left BKA, partial fifth ray amputation right foot Right 4th toe with lysis, subungual hematoma  No images are attached to the encounter.  Assessment:   1. Hematoma, subungual, fourth toe, right, initial encounter   2. PAD (peripheral artery disease) ([place])      Plan:  Patient was evaluated and treated and all questions answered.  Subungual Hematoma Right -Likely 2/2 injury -Does not appear to be from DM shoes, but could be secondary to hammertoe deformity -Nail debrided back, hematoma evacuated. Area cleansed and abx ointment and band-aid applied. -Discussed with pt's daughter monitoring the area given patient's hx of PAD   No follow-ups on file.

## 2020-04-25 DIAGNOSIS — Z992 Dependence on renal dialysis: Secondary | ICD-10-CM | POA: Diagnosis not present

## 2020-04-25 DIAGNOSIS — E119 Type 2 diabetes mellitus without complications: Secondary | ICD-10-CM | POA: Diagnosis not present

## 2020-04-25 DIAGNOSIS — D689 Coagulation defect, unspecified: Secondary | ICD-10-CM | POA: Diagnosis not present

## 2020-04-25 DIAGNOSIS — T8743 Infection of amputation stump, right lower extremity: Secondary | ICD-10-CM | POA: Diagnosis not present

## 2020-04-25 DIAGNOSIS — A499 Bacterial infection, unspecified: Secondary | ICD-10-CM | POA: Diagnosis not present

## 2020-04-25 DIAGNOSIS — R52 Pain, unspecified: Secondary | ICD-10-CM | POA: Diagnosis not present

## 2020-04-25 DIAGNOSIS — N2581 Secondary hyperparathyroidism of renal origin: Secondary | ICD-10-CM | POA: Diagnosis not present

## 2020-04-25 DIAGNOSIS — N186 End stage renal disease: Secondary | ICD-10-CM | POA: Diagnosis not present

## 2020-04-25 LAB — CUP PACEART REMOTE DEVICE CHECK
Battery Remaining Longevity: 42 mo
Battery Remaining Percentage: 47 %
Brady Statistic RV Percent Paced: 0 %
Date Time Interrogation Session: 20210913013100
HighPow Impedance: 134 Ohm
Implantable Lead Implant Date: 20111118
Implantable Lead Location: 753860
Implantable Lead Model: 181
Implantable Lead Serial Number: 313758
Implantable Pulse Generator Implant Date: 20111118
Lead Channel Impedance Value: 675 Ohm
Lead Channel Pacing Threshold Amplitude: 0.7 V
Lead Channel Pacing Threshold Pulse Width: 0.5 ms
Lead Channel Setting Pacing Amplitude: 2.4 V
Lead Channel Setting Pacing Pulse Width: 0.5 ms
Lead Channel Setting Sensing Sensitivity: 0.5 mV
Pulse Gen Serial Number: 268731

## 2020-04-25 NOTE — Progress Notes (Signed)
Remote ICD transmission.   

## 2020-04-26 ENCOUNTER — Other Ambulatory Visit: Payer: Self-pay

## 2020-04-26 ENCOUNTER — Ambulatory Visit (INDEPENDENT_AMBULATORY_CARE_PROVIDER_SITE_OTHER): Payer: Medicare Other | Admitting: Physician Assistant

## 2020-04-26 VITALS — BP 78/52 | HR 45 | Temp 97.9°F | Resp 16 | Ht 68.0 in | Wt 169.0 lb

## 2020-04-26 DIAGNOSIS — I739 Peripheral vascular disease, unspecified: Secondary | ICD-10-CM | POA: Diagnosis not present

## 2020-04-26 DIAGNOSIS — Z89512 Acquired absence of left leg below knee: Secondary | ICD-10-CM

## 2020-04-26 NOTE — Progress Notes (Signed)
POST OPERATIVE OFFICE NOTE    CC:  F/u for surgery  HPI:  This is a 74 y.o. 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 He presents today with complaints of a small nodule at his mid incision line.  He states this this is painful and has been present since his surgery.  He denies drainage from this area, fever or chills.  His daughter accompanies him today.  He has a history of right foot wound status post right fourth and fifth toe amputation managed by podiatry.  The nail of his third toe recently was injured and this was removed by his podiatrist.  No Known Allergies  Current Outpatient Medications  Medication Sig Dispense Refill  . acetaminophen (TYLENOL) 500 MG tablet Take 500 mg by mouth every 6 (six) hours as needed for mild pain or headache.     . Amino Acids-Protein Hydrolys (FEEDING SUPPLEMENT, PRO-STAT SUGAR FREE 64,) LIQD Take 30 mLs by mouth 2 (two) times daily. 887 mL 0  . AURYXIA 1 GM 210 MG(Fe) tablet Take 210 mg by mouth 3 (three) times daily.     . bisacodyl (DULCOLAX) 5 MG EC tablet Take 1 tablet (5 mg total) by mouth daily as needed for moderate constipation. 30 tablet 0  . calcitRIOL (ROCALTROL) 0.25 MCG capsule Take 0.25 mcg by mouth every Monday, Wednesday, and Friday.    . Calcium Carbonate Antacid (CALCIUM CARBONATE, DOSED IN MG ELEMENTAL CALCIUM,) 1250 MG/5ML SUSP Take 5 mLs (500 mg of elemental calcium total) by mouth every 6 (six) hours as needed for indigestion. 450 mL 0  . cinacalcet (SENSIPAR) 30 MG tablet Take 1 tablet (30 mg total) by mouth every other day. 60 tablet 0  . clopidogrel (PLAVIX) 75 MG tablet Take 1 tablet (75 mg total) by mouth daily with breakfast. 30 tablet 0  . colchicine 0.6 MG tablet Take 0.6 mg by mouth daily as needed (as directed for gout flares).   1  . collagenase (SANTYL) ointment Apply topically daily. Apply as directed per home health nurse.  Apply to right foot wound edge to edge nickel thick.  Cover with wet  gauze followed by dry gauze and change daily 15 g 0  . docusate sodium (COLACE) 100 MG capsule Take 2 capsules (200 mg total) by mouth 2 (two) times daily. 10 capsule 0  . methocarbamol (ROBAXIN) 500 MG tablet Take 1 tablet (500 mg total) by mouth every 12 (twelve) hours as needed for muscle spasms. 60 tablet 0  . Methoxy PEG-Epoetin Beta (MIRCERA IJ) Mircera    . midodrine (PROAMATINE) 10 MG tablet Take 1 tablet (10 mg total) by mouth every Monday, Wednesday, and Friday with hemodialysis. 60 tablet 6  . RENVELA 800 MG tablet Take 2 tablets by mouth in the morning, at noon, and at bedtime.    . rosuvastatin (CRESTOR) 10 MG tablet Take 1 tablet (10 mg total) by mouth daily at 6 PM. 30 tablet 0  . HYDROcodone-acetaminophen (NORCO) 5-325 MG tablet Take 1 tablet by mouth every 6 (six) hours as needed for moderate pain. (Patient not taking: Reported on 04/26/2020) 12 tablet 0  . oxyCODONE (OXY IR/ROXICODONE) 5 MG immediate release tablet Take by mouth.  (Patient not taking: Reported on 04/26/2020)    . silver sulfADIAZINE (SILVADENE) 1 % cream Apply pea-sized amount to wound daily. (Patient not taking: Reported on 04/26/2020) 50 g 0   No current facility-administered medications for this visit.  ROS:  See HPI  BP (!) 78/52 (BP Location: Left Arm, Patient Position: Sitting, Cuff Size: Normal)   Pulse (!) 45   Temp 97.9 F (36.6 C) (Temporal)   Resp 16   Ht 5\' 8"  (1.727 m)   Wt 169 lb (76.7 kg)   SpO2 99%   BMI 25.70 kg/m   Physical Exam:   General appearance: In no apparent distress Cardiac: Rate and rhythm are regular Respiratory: Nonlabored Incision: Left BKA incision is well-healed Extremities: He has an approximately 2 to 3 mm nodule at the mid incision.  This appears to have possibly been a Vicryl stitch.  There is no surrounding erythema or drainage.  His right foot is warm and well perfused.  Third toe nailbed with beefy red granulation tissue. Neuro: Alert and oriented  x4  Assessment/Plan:  This is a 74 y.o. male who is s/p: Left BKA approximately 1 year ago.  He has good knee extension.  After Betadine scrub, I clipped the nodule away from the incision line.  Antibiotic ointment applied and covered with Band-Aid.  I recommended antibiotic ointment daily and cover with Band-Aid during the day and remove at nighttime.  We will follow-up in 1 month to examine this area for healing and repeat his right ABIs.  Prescription given for a biotech left BKA prosthesis.  He has follow-up appointment arranged with his podiatrist.  Risa Grill, PA-C [facility] [phone]  Clinic MD:  Carlis Abbott on-call

## 2020-04-27 ENCOUNTER — Other Ambulatory Visit: Payer: Self-pay | Admitting: *Deleted

## 2020-04-27 DIAGNOSIS — E119 Type 2 diabetes mellitus without complications: Secondary | ICD-10-CM | POA: Diagnosis not present

## 2020-04-27 DIAGNOSIS — R52 Pain, unspecified: Secondary | ICD-10-CM | POA: Diagnosis not present

## 2020-04-27 DIAGNOSIS — N2581 Secondary hyperparathyroidism of renal origin: Secondary | ICD-10-CM | POA: Diagnosis not present

## 2020-04-27 DIAGNOSIS — A499 Bacterial infection, unspecified: Secondary | ICD-10-CM | POA: Diagnosis not present

## 2020-04-27 DIAGNOSIS — N186 End stage renal disease: Secondary | ICD-10-CM | POA: Diagnosis not present

## 2020-04-27 DIAGNOSIS — Z992 Dependence on renal dialysis: Secondary | ICD-10-CM | POA: Diagnosis not present

## 2020-04-27 DIAGNOSIS — I739 Peripheral vascular disease, unspecified: Secondary | ICD-10-CM

## 2020-04-27 DIAGNOSIS — T8743 Infection of amputation stump, right lower extremity: Secondary | ICD-10-CM | POA: Diagnosis not present

## 2020-04-27 DIAGNOSIS — D689 Coagulation defect, unspecified: Secondary | ICD-10-CM | POA: Diagnosis not present

## 2020-04-29 DIAGNOSIS — Z89512 Acquired absence of left leg below knee: Secondary | ICD-10-CM | POA: Diagnosis not present

## 2020-04-30 DIAGNOSIS — D689 Coagulation defect, unspecified: Secondary | ICD-10-CM | POA: Diagnosis not present

## 2020-04-30 DIAGNOSIS — E119 Type 2 diabetes mellitus without complications: Secondary | ICD-10-CM | POA: Diagnosis not present

## 2020-04-30 DIAGNOSIS — N186 End stage renal disease: Secondary | ICD-10-CM | POA: Diagnosis not present

## 2020-04-30 DIAGNOSIS — R52 Pain, unspecified: Secondary | ICD-10-CM | POA: Diagnosis not present

## 2020-04-30 DIAGNOSIS — Z992 Dependence on renal dialysis: Secondary | ICD-10-CM | POA: Diagnosis not present

## 2020-04-30 DIAGNOSIS — N2581 Secondary hyperparathyroidism of renal origin: Secondary | ICD-10-CM | POA: Diagnosis not present

## 2020-04-30 DIAGNOSIS — T8743 Infection of amputation stump, right lower extremity: Secondary | ICD-10-CM | POA: Diagnosis not present

## 2020-04-30 DIAGNOSIS — A499 Bacterial infection, unspecified: Secondary | ICD-10-CM | POA: Diagnosis not present

## 2020-05-02 DIAGNOSIS — E119 Type 2 diabetes mellitus without complications: Secondary | ICD-10-CM | POA: Diagnosis not present

## 2020-05-02 DIAGNOSIS — A499 Bacterial infection, unspecified: Secondary | ICD-10-CM | POA: Diagnosis not present

## 2020-05-02 DIAGNOSIS — D689 Coagulation defect, unspecified: Secondary | ICD-10-CM | POA: Diagnosis not present

## 2020-05-02 DIAGNOSIS — N2581 Secondary hyperparathyroidism of renal origin: Secondary | ICD-10-CM | POA: Diagnosis not present

## 2020-05-02 DIAGNOSIS — N186 End stage renal disease: Secondary | ICD-10-CM | POA: Diagnosis not present

## 2020-05-02 DIAGNOSIS — R52 Pain, unspecified: Secondary | ICD-10-CM | POA: Diagnosis not present

## 2020-05-02 DIAGNOSIS — T8743 Infection of amputation stump, right lower extremity: Secondary | ICD-10-CM | POA: Diagnosis not present

## 2020-05-02 DIAGNOSIS — Z992 Dependence on renal dialysis: Secondary | ICD-10-CM | POA: Diagnosis not present

## 2020-05-04 DIAGNOSIS — N2581 Secondary hyperparathyroidism of renal origin: Secondary | ICD-10-CM | POA: Diagnosis not present

## 2020-05-04 DIAGNOSIS — R52 Pain, unspecified: Secondary | ICD-10-CM | POA: Diagnosis not present

## 2020-05-04 DIAGNOSIS — Z992 Dependence on renal dialysis: Secondary | ICD-10-CM | POA: Diagnosis not present

## 2020-05-04 DIAGNOSIS — E119 Type 2 diabetes mellitus without complications: Secondary | ICD-10-CM | POA: Diagnosis not present

## 2020-05-04 DIAGNOSIS — D689 Coagulation defect, unspecified: Secondary | ICD-10-CM | POA: Diagnosis not present

## 2020-05-04 DIAGNOSIS — T8743 Infection of amputation stump, right lower extremity: Secondary | ICD-10-CM | POA: Diagnosis not present

## 2020-05-04 DIAGNOSIS — A499 Bacterial infection, unspecified: Secondary | ICD-10-CM | POA: Diagnosis not present

## 2020-05-04 DIAGNOSIS — N186 End stage renal disease: Secondary | ICD-10-CM | POA: Diagnosis not present

## 2020-05-07 DIAGNOSIS — D689 Coagulation defect, unspecified: Secondary | ICD-10-CM | POA: Diagnosis not present

## 2020-05-07 DIAGNOSIS — Z992 Dependence on renal dialysis: Secondary | ICD-10-CM | POA: Diagnosis not present

## 2020-05-07 DIAGNOSIS — N186 End stage renal disease: Secondary | ICD-10-CM | POA: Diagnosis not present

## 2020-05-07 DIAGNOSIS — T8743 Infection of amputation stump, right lower extremity: Secondary | ICD-10-CM | POA: Diagnosis not present

## 2020-05-07 DIAGNOSIS — N2581 Secondary hyperparathyroidism of renal origin: Secondary | ICD-10-CM | POA: Diagnosis not present

## 2020-05-07 DIAGNOSIS — A499 Bacterial infection, unspecified: Secondary | ICD-10-CM | POA: Diagnosis not present

## 2020-05-07 DIAGNOSIS — R52 Pain, unspecified: Secondary | ICD-10-CM | POA: Diagnosis not present

## 2020-05-07 DIAGNOSIS — E119 Type 2 diabetes mellitus without complications: Secondary | ICD-10-CM | POA: Diagnosis not present

## 2020-05-08 DIAGNOSIS — T82858A Stenosis of vascular prosthetic devices, implants and grafts, initial encounter: Secondary | ICD-10-CM | POA: Diagnosis not present

## 2020-05-08 DIAGNOSIS — I871 Compression of vein: Secondary | ICD-10-CM | POA: Diagnosis not present

## 2020-05-08 DIAGNOSIS — N186 End stage renal disease: Secondary | ICD-10-CM | POA: Diagnosis not present

## 2020-05-09 DIAGNOSIS — Z992 Dependence on renal dialysis: Secondary | ICD-10-CM | POA: Diagnosis not present

## 2020-05-09 DIAGNOSIS — N2581 Secondary hyperparathyroidism of renal origin: Secondary | ICD-10-CM | POA: Diagnosis not present

## 2020-05-09 DIAGNOSIS — N186 End stage renal disease: Secondary | ICD-10-CM | POA: Diagnosis not present

## 2020-05-09 DIAGNOSIS — D689 Coagulation defect, unspecified: Secondary | ICD-10-CM | POA: Diagnosis not present

## 2020-05-09 DIAGNOSIS — R52 Pain, unspecified: Secondary | ICD-10-CM | POA: Diagnosis not present

## 2020-05-09 DIAGNOSIS — T8743 Infection of amputation stump, right lower extremity: Secondary | ICD-10-CM | POA: Diagnosis not present

## 2020-05-09 DIAGNOSIS — A499 Bacterial infection, unspecified: Secondary | ICD-10-CM | POA: Diagnosis not present

## 2020-05-09 DIAGNOSIS — E119 Type 2 diabetes mellitus without complications: Secondary | ICD-10-CM | POA: Diagnosis not present

## 2020-05-10 ENCOUNTER — Other Ambulatory Visit: Payer: Self-pay

## 2020-05-10 ENCOUNTER — Encounter (INDEPENDENT_AMBULATORY_CARE_PROVIDER_SITE_OTHER): Payer: Self-pay | Admitting: Ophthalmology

## 2020-05-10 ENCOUNTER — Ambulatory Visit (INDEPENDENT_AMBULATORY_CARE_PROVIDER_SITE_OTHER): Payer: Medicare Other | Admitting: Ophthalmology

## 2020-05-10 DIAGNOSIS — H3554 Dystrophies primarily involving the retinal pigment epithelium: Secondary | ICD-10-CM

## 2020-05-10 DIAGNOSIS — E113492 Type 2 diabetes mellitus with severe nonproliferative diabetic retinopathy without macular edema, left eye: Secondary | ICD-10-CM | POA: Diagnosis not present

## 2020-05-10 DIAGNOSIS — N186 End stage renal disease: Secondary | ICD-10-CM | POA: Diagnosis not present

## 2020-05-10 DIAGNOSIS — I12 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 chronic kidney disease or end stage renal disease: Secondary | ICD-10-CM | POA: Diagnosis not present

## 2020-05-10 DIAGNOSIS — E113491 Type 2 diabetes mellitus with severe nonproliferative diabetic retinopathy without macular edema, right eye: Secondary | ICD-10-CM

## 2020-05-10 DIAGNOSIS — Z992 Dependence on renal dialysis: Secondary | ICD-10-CM | POA: Diagnosis not present

## 2020-05-10 NOTE — Assessment & Plan Note (Signed)
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

## 2020-05-10 NOTE — Assessment & Plan Note (Signed)
Good PRP present OS will likely prevent progression eventually to PDR.  Will observe

## 2020-05-10 NOTE — Progress Notes (Signed)
05/10/2020     CHIEF COMPLAINT Patient presents for Retina Follow Up   HISTORY OF PRESENT ILLNESS: Timothy Faulkner is a 74 y.o. male who presents to the clinic today for:   HPI    Retina Follow Up    Patient presents with  Diabetic Retinopathy.  In both eyes.  This started 4 months ago.  Severity is moderate.  Duration of 4 months.  Since onset it is stable.          Comments    4 Month Diabetic F/U OU  Pt denies noticeable changes to [place] OU since last visit. Pt denies ocular pain, flashes of light, or floaters OU.  A1c: 4.7, 02/2020 LBS: 117 yesterday        Last edited by Rockie Neighbours, [place] on 05/10/2020  2:52 PM. (History)      Referring physician: [name], MD [phone],  [place] [address]  HISTORICAL INFORMATION:   Selected notes from the MEDICAL RECORD NUMBER    Lab Results  Component Value Date   HGBA1C 5.2 03/12/2019     CURRENT MEDICATIONS: No current outpatient medications on file. (Ophthalmic Drugs)   No current facility-administered medications for this visit. (Ophthalmic Drugs)   Current Outpatient Medications (Other)  Medication Sig  . acetaminophen (TYLENOL) 500 MG tablet Take 500 mg by mouth every 6 (six) hours as needed for mild pain or headache.   . Amino Acids-Protein Hydrolys (FEEDING SUPPLEMENT, PRO-STAT SUGAR FREE 64,) LIQD Take 30 mLs by mouth 2 (two) times daily.  Lorin Picket 1 GM 210 MG(Fe) tablet Take 210 mg by mouth 3 (three) times daily.   . bisacodyl (DULCOLAX) 5 MG EC tablet Take 1 tablet (5 mg total) by mouth daily as needed for moderate constipation.  . calcitRIOL (ROCALTROL) 0.25 MCG capsule Take 0.25 mcg by mouth every Monday, Wednesday, and Friday.  . Calcium Carbonate Antacid (CALCIUM CARBONATE, DOSED IN MG ELEMENTAL CALCIUM,) 1250 MG/5ML SUSP Take 5 mLs (500 mg of elemental calcium total) by mouth every 6 (six) hours as needed for indigestion.  . cinacalcet (SENSIPAR) 30 MG tablet Take 1 tablet  (30 mg total) by mouth every other day.  . clopidogrel (PLAVIX) 75 MG tablet Take 1 tablet (75 mg total) by mouth daily with breakfast.  . colchicine 0.6 MG tablet Take 0.6 mg by mouth daily as needed (as directed for gout flares).   . collagenase (SANTYL) ointment Apply topically daily. Apply as directed per home health nurse.  Apply to right foot wound edge to edge nickel thick.  Cover with wet gauze followed by dry gauze and change daily  . docusate sodium (COLACE) 100 MG capsule Take 2 capsules (200 mg total) by mouth 2 (two) times daily.  Marland Kitchen HYDROcodone-acetaminophen (NORCO) 5-325 MG tablet Take 1 tablet by mouth every 6 (six) hours as needed for moderate pain. (Patient not taking: Reported on 04/26/2020)  . methocarbamol (ROBAXIN) 500 MG tablet Take 1 tablet (500 mg total) by mouth every 12 (twelve) hours as needed for muscle spasms.  . Methoxy PEG-Epoetin Beta (MIRCERA IJ) Mircera  . midodrine (PROAMATINE) 10 MG tablet Take 1 tablet (10 mg total) by mouth every Monday, Wednesday, and Friday with hemodialysis.  Marland Kitchen oxyCODONE (OXY IR/ROXICODONE) 5 MG immediate release tablet Take by mouth.  (Patient not taking: Reported on 04/26/2020)  . RENVELA 800 MG tablet Take 2 tablets by mouth in the morning, at noon, and at bedtime.  . rosuvastatin (CRESTOR) 10  MG tablet Take 1 tablet (10 mg total) by mouth daily at 6 PM.  . silver sulfADIAZINE (SILVADENE) 1 % cream Apply pea-sized amount to wound daily. (Patient not taking: Reported on 04/26/2020)   No current facility-administered medications for this visit. (Other)      REVIEW OF SYSTEMS:    ALLERGIES No Known Allergies  PAST MEDICAL HISTORY Past Medical History:  Diagnosis Date  . Anemia   . Arthritis    Gout  . Automatic implantable cardioverter-defibrillator in situ    [employer]  . CHF   . ESRD on dialysis [facility])    [name] 03/11/2013 (03/11/2013) dialysis M/W/F  . Gangrene ([place])    left foot  . GERD (gastroesophageal reflux  disease)   . Gout    "once a year"  . Heart murmur   . HYPERCHOLESTEROLEMIA, MIXED   . HYPERTENSION   . Macular degeneration   . Osteomyelitis ([place])    right foot  . 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 Pacemaker   . Peripheral arterial disease (HCC)    left fifth toe ulcer, healing  . Pneumonia   . Shortness of breath   . 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 Wears dentures   . Wears glasses    Past Surgical History:  Procedure Laterality Date  . A/V FISTULAGRAM Left 07/20/2018   Procedure: A/V FISTULAGRAM;  Surgeon: [name], MD;  Location: [place] [facility];  Service: Cardiovascular;  Laterality: Left;  . A/V FISTULAGRAM N/A 07/15/2019   Procedure: A/V FISTULAGRAM - Left Arm;  Surgeon: Elam Dutch, MD;  Location: [place] [facility];  Service: Cardiovascular;  Laterality: N/A;  . ABDOMINAL AORTOGRAM N/A 02/25/2019   Procedure: ABDOMINAL AORTOGRAM;  Surgeon: Elam Dutch, MD;  Location: [place] [facility];  Service: Cardiovascular;  Laterality: N/A;  . ABDOMINAL AORTOGRAM W/LOWER EXTREMITY Bilateral 07/20/2018   Procedure: ABDOMINAL AORTOGRAM W/LOWER EXTREMITY;  Surgeon: [name], MD;  Location: [place] [facility];  Service: Cardiovascular;  Laterality: Bilateral;  . AMPUTATION Right 09/07/2018   Procedure: Right fifth metatarsectomy;  Surgeon: [name], DPM;  Location: [place];  Service: Podiatry;  Laterality: Right;  . AMPUTATION Left 03/08/2019   Procedure: AMPUTATION  3RD AND 4TH TOES LEFT FOOT;  Surgeon: [name], DPM;  Location: [place];  Service: Podiatry;  Laterality: Left;  . AMPUTATION Left 03/15/2019   Procedure: AMPUTATION BELOW KNEE;  Surgeon: Elam Dutch, MD;  Location: [facility] OR;  Service: Vascular;  Laterality: Left;  . AV FISTULA PLACEMENT Right 12/13/2012   Procedure: ARTERIOVENOUS (AV) FISTULA CREATION;  Surgeon: [name], MD;  Location: [place];  Service: Vascular;   Laterality: Right;  Ultrasound guided  . AV FISTULA PLACEMENT Left 05/07/2016   Procedure: LEFT RADIOCEPHALIC ARTERIOVENOUS (AV) FISTULA CREATION;  Surgeon: [name], MD;  Location: [facility] OR;  Service: Vascular;  Laterality: Left;  . AV FISTULA PLACEMENT Right 08/09/2019   Procedure: INSERTION OF RIGHT ARTERIOVENOUS (AV) 4-77mm GORE-TEX STRETCH GRAFT RIGHT  ARM;  Surgeon: Elam Dutch, MD;  Location: [place];  Service: Vascular;  Laterality: Right;  . BASCILIC VEIN TRANSPOSITION Right 03/26/2016   Procedure: RIGHT BASILIC VEIN TRANSPOSITION;  Surgeon: [name], MD;  Location: [place];  Service: Vascular;  Laterality: Right;  . CARDIAC CATHETERIZATION    . CARDIAC DEFIBRILLATOR PLACEMENT     Chemical engineer  . CATARACT EXTRACTION W/PHACO  Bilateral   . EYE SURGERY Bilateral    Cataract  . FISTULOGRAM Left 04/22/2018   Procedure: FISTULOGRAM UPPER EXTREMITY;  Surgeon: [name], MD;  Location: [place];  Service: Vascular;  Laterality: Left;  . I & D EXTREMITY Right 07/15/2018   Procedure: IRRIGATION AND DEBRIDEMENT RIGHT FOOT;  Surgeon: [name], DPM;  Location: [place];  Service: Podiatry;  Laterality: Right;  . INCISION AND DRAINAGE ABSCESS / HEMATOMA OF [place] / KNEE / THIGH Left 2012   "knee" (03/11/2013)  . INSERT / REPLACE / REMOVE PACEMAKER    . INSERTION OF DIALYSIS CATHETER Left 04/22/2018   Procedure: INSERTION OF DIALYSIS CATHETER;  Surgeon: [name], MD;  Location: [place];  Service: Vascular;  Laterality: Left;  . IR FLUORO GUIDE CV LINE LEFT  07/15/2018  . IR PTA VENOUS EXCEPT DIALYSIS CIRCUIT  07/15/2018  . LOWER EXTREMITY ANGIOGRAPHY Right 07/21/2018   Procedure: LOWER EXTREMITY ANGIOGRAPHY;  Surgeon: [name], MD;  Location: [place] CV LAB;  Service: Cardiovascular;  Laterality: Right;  . LOWER EXTREMITY ANGIOGRAPHY Bilateral 02/25/2019   Procedure: Lower Extremity Angiography;  Surgeon: Elam Dutch, MD;  Location: [place] [facility]  LAB;  Service: Cardiovascular;  Laterality: Bilateral;  . METATARSAL HEAD EXCISION Right 07/15/2018   Procedure: METATARSAL RESECTION;  Surgeon: [name], DPM;  Location: [place];  Service: Podiatry;  Laterality: Right;  . MULTIPLE TOOTH EXTRACTIONS    . PERIPHERAL VASCULAR ATHERECTOMY Right 02/28/2019   Procedure: PERIPHERAL VASCULAR ATHERECTOMY;  Surgeon: Waynetta Sandy, MD;  Location: [place] CV LAB;  Service: Cardiovascular;  Laterality: Right;  right tp trunk   . PERIPHERAL VASCULAR BALLOON ANGIOPLASTY Left 02/25/2019   Procedure: PERIPHERAL VASCULAR BALLOON ANGIOPLASTY;  Surgeon: Elam Dutch, MD;  Location: [place] [facility];  Service: Cardiovascular;  Laterality: Left;  tibial peroneal trunk and PT  . PERIPHERAL VASCULAR INTERVENTION Right 07/21/2018   Procedure: PERIPHERAL VASCULAR INTERVENTION;  Surgeon: [name], MD;  Location: [place] [facility];  Service: Cardiovascular;  Laterality: Right;  peroneal stents  . REVISON OF ARTERIOVENOUS FISTULA Right 3/55/7322   Procedure: Plication OF Right Arm ARTERIOVENOUS FISTULA;  Surgeon: Elam Dutch, MD;  Location: [facility] OR;  Service: Vascular;  Laterality: Right;  . REVISON OF ARTERIOVENOUS FISTULA Left 04/22/2018   Procedure: REVISION OF RADIOCEPHALIC ARTERIOVENOUS FISTULA;  Surgeon: [name], MD;  Location: [place];  Service: Vascular;  Laterality: Left;  . SHUNTOGRAM N/A 05/31/2013   Procedure: Earney Mallet;  Surgeon: [name], MD;  Location: [facility] CATH LAB;  Service: Cardiovascular;  Laterality: N/A;  . UPPER EXTREMITY VENOGRAPHY Right 07/23/2018   Procedure: UPPER EXTREMITY VENOGRAPHY;  Surgeon: [name], MD;  Location: [place] [facility];  Service: Cardiovascular;  Laterality: Right;  . UPPER EXTREMITY VENOGRAPHY  07/15/2019   Procedure: UPPER EXTREMITY VENOGRAPHY;  Surgeon: Elam Dutch, MD;  Location: [place] [facility];  Service: Cardiovascular;;  rt arm   . WOUND  DEBRIDEMENT Right 07/17/2018   Procedure: Wound Debridement; Closure Filleted toe flap Right Foot;  Surgeon: [name], DPM;  Location: [place];  Service: Podiatry;  Laterality: Right;  . WOUND DEBRIDEMENT Right 09/07/2018   Procedure: Debridement Right Foot Wound, application of wound vac;  Surgeon: [name], DPM;  Location: [place];  Service: Podiatry;  Laterality: Right;  . WOUND DEBRIDEMENT Right 03/08/2019   Procedure: DEBRIDEMENT WOUND RIGHT FOOT;  Surgeon: [name], DPM;  Location:  [place] OR;  Service: Podiatry;  Laterality: Right;    FAMILY HISTORY Family History  Problem Relation Age of Onset  . Heart disease Father   . CAD Father     SOCIAL HISTORY Social History   Tobacco Use  . Smoking status: Former Smoker    Packs/day: 0.75    Years: 30.00    Pack years: 22.50    Types: Cigarettes    Quit date: 11/25/1992    Years since quitting: 27.4  . Smokeless tobacco: Never Used  Vaping Use  . Vaping Use: Never used  Substance Use Topics  . Alcohol use: Not Currently  . Drug use: Not Currently    Types: Marijuana    Comment: last use 10 years ago         OPHTHALMIC EXAM: Base Eye Exam    Visual Acuity (ETDRS)      Right Left   Dist cc 20/200 20/200   Dist ph cc NI NI   Correction: Glasses       Tonometry (Tonopen, 2:52 PM)      Right Left   Pressure 05 04       Pupils      Pupils Dark Light Shape React APD   Right PERRL 4 3 Round Brisk None   Left PERRL 4 3 Round Brisk None       Visual Fields (Counting fingers)      Left Right    Full Full       Extraocular Movement      Right Left    Full Full       Neuro/Psych    Oriented x3: Yes   Mood/Affect: Normal       Dilation    Both eyes: 1.0% Mydriacyl, 2.5% Phenylephrine @ 2:57 PM        Slit Lamp and Fundus Exam    External Exam      Right Left   External Normal Normal       Slit Lamp Exam      Right Left   Lids/Lashes Normal Normal   Conjunctiva/Sclera White and quiet  White and quiet   Cornea Clear Clear   Anterior Chamber Deep and quiet Deep and quiet   Iris Round and reactive Round and reactive   Lens Posterior chamber intraocular lens Posterior chamber intraocular lens   Anterior Vitreous Normal Normal       Fundus Exam      Right Left   Posterior Vitreous Central vitreous floaters, Vitreous syneresis Central vitreous floaters, Vitreous syneresis   Disc Normal Normal   C/D Ratio 0.25 0.4   Macula Central  and atrophy pigmentation, macula,retinal atrophy, Mottling, Retinal pigment epithelial mottling, Atrophy, Macular atrophy Central pigmentation, macula, outer retinal atrophy, Mottling, Retinal pigment epithelial mottling, Atrophy, Macular atrophy   Vessels Severe NPDR Severe NPDR   Periphery good PRP, no active NVE good PRP, no active NVE          IMAGING AND PROCEDURES  Imaging and Procedures for 05/10/20  Color Fundus Photography Optos - OU - Both Eyes       Right Eye Progression has been stable. Disc findings include normal observations. Macula : geographic atrophy, microaneurysms.   Left Eye Progression has been stable. Disc findings include normal observations. Macula : microaneurysms.   Notes OD, good PRP peripherally.  Some vitreous debris, no vitreous hemorrhage  OS medial opacity, does not correlate however with clinical findings on examination thus there is no vitreous hemorrhage in  the left eye.  Good PRP present peripherally.  No signs of active NVE                ASSESSMENT/PLAN:  Severe nonproliferative diabetic retinopathy of right eye without macular edema associated with type 2 diabetes mellitus (HCC) Good PRP present OD will likely prevent progression eventually to PDR.  Will observe  Severe nonproliferative diabetic retinopathy of left eye without macular edema associated with type 2 diabetes mellitus (HCC) Good PRP present OS will likely prevent progression eventually to PDR.  Will observe  Adult onset  vitelliform macular dystrophy Central areolar atrophy in the macula accounts for the visual acuity, is not treatable      ICD-10-CM   1. Severe nonproliferative diabetic retinopathy of right eye without macular edema associated with type 2 diabetes mellitus (HCC)  W73.7106 Color Fundus Photography Optos - OU - Both Eyes  2. Severe nonproliferative diabetic retinopathy of left eye without macular edema associated with type 2 diabetes mellitus ([place])  Y69.4854   3. Adult onset vitelliform macular dystrophy  H35.54     1.  OU with still stabilized severe nonproliferative diabetic retinopathy, will observe  2.  OU with some form of geographic atrophy centrally likely from prior adult vitelliform macular dystrophy now involutional with RPE atrophy  3.  No signs of progression to PDR at this time OU.  4.  Patient instructed to notify the office promptly for new visual acuity decline, dark floaters or change in vision.  Ophthalmic Meds Ordered this visit:  No orders of the defined types were placed in this encounter.      Return in about 6 months (around 11/07/2020) for DILATE OU, COLOR FP.  There are no Patient Instructions on file for this visit.   Explained the diagnoses, plan, and follow up with the patient and they expressed understanding.  Patient expressed understanding of the importance of proper follow up care.   Clent Demark Robena Ewy M.D. Diseases & Surgery of the Retina and Vitreous Retina & Diabetic [place] 05/10/20     Abbreviations: M myopia (nearsighted); A astigmatism; H hyperopia (farsighted); P presbyopia; Mrx spectacle prescription;  CTL contact lenses; OD right eye; OS left eye; OU both eyes  XT exotropia; ET esotropia; PEK punctate epithelial keratitis; PEE punctate epithelial erosions; DES dry eye syndrome; MGD meibomian gland dysfunction; ATs artificial tears; PFAT's preservative free artificial tears; [place] nuclear sclerotic cataract; PSC posterior subcapsular  cataract; ERM epi-retinal membrane; PVD posterior vitreous detachment; RD retinal detachment; DM diabetes mellitus; DR diabetic retinopathy; NPDR non-proliferative diabetic retinopathy; PDR proliferative diabetic retinopathy; CSME clinically significant macular edema; DME diabetic macular edema; dbh dot blot hemorrhages; CWS cotton wool spot; POAG primary open angle glaucoma; C/D cup-to-disc ratio; HVF humphrey visual field; GVF goldmann visual field; OCT optical coherence tomography; IOP intraocular pressure; BRVO Branch retinal vein occlusion; CRVO central retinal vein occlusion; CRAO central retinal artery occlusion; BRAO branch retinal artery occlusion; RT retinal tear; SB scleral buckle; PPV pars plana vitrectomy; VH Vitreous hemorrhage; PRP panretinal laser photocoagulation; IVK intravitreal kenalog; VMT vitreomacular traction; MH Macular hole;  NVD neovascularization of the disc; NVE neovascularization elsewhere; AREDS age related eye disease study; ARMD age related macular degeneration; POAG primary open angle glaucoma; EBMD epithelial/anterior basement membrane dystrophy; ACIOL anterior chamber intraocular lens; IOL intraocular lens; PCIOL posterior chamber intraocular lens; Phaco/IOL phacoemulsification with intraocular lens placement; [place] photorefractive keratectomy; LASIK laser assisted in situ keratomileusis; HTN hypertension; DM diabetes mellitus; COPD chronic obstructive pulmonary disease

## 2020-05-10 NOTE — Assessment & Plan Note (Signed)
Good PRP present OD will likely prevent progression eventually to PDR.  Will observe

## 2020-05-11 DIAGNOSIS — D689 Coagulation defect, unspecified: Secondary | ICD-10-CM | POA: Diagnosis not present

## 2020-05-11 DIAGNOSIS — Z992 Dependence on renal dialysis: Secondary | ICD-10-CM | POA: Diagnosis not present

## 2020-05-11 DIAGNOSIS — N186 End stage renal disease: Secondary | ICD-10-CM | POA: Diagnosis not present

## 2020-05-11 DIAGNOSIS — E119 Type 2 diabetes mellitus without complications: Secondary | ICD-10-CM | POA: Diagnosis not present

## 2020-05-11 DIAGNOSIS — I10 Essential (primary) hypertension: Secondary | ICD-10-CM | POA: Diagnosis not present

## 2020-05-11 DIAGNOSIS — N2581 Secondary hyperparathyroidism of renal origin: Secondary | ICD-10-CM | POA: Diagnosis not present

## 2020-05-14 DIAGNOSIS — Z992 Dependence on renal dialysis: Secondary | ICD-10-CM | POA: Diagnosis not present

## 2020-05-14 DIAGNOSIS — N186 End stage renal disease: Secondary | ICD-10-CM | POA: Diagnosis not present

## 2020-05-14 DIAGNOSIS — E119 Type 2 diabetes mellitus without complications: Secondary | ICD-10-CM | POA: Diagnosis not present

## 2020-05-14 DIAGNOSIS — D689 Coagulation defect, unspecified: Secondary | ICD-10-CM | POA: Diagnosis not present

## 2020-05-14 DIAGNOSIS — I10 Essential (primary) hypertension: Secondary | ICD-10-CM | POA: Diagnosis not present

## 2020-05-14 DIAGNOSIS — N2581 Secondary hyperparathyroidism of renal origin: Secondary | ICD-10-CM | POA: Diagnosis not present

## 2020-05-15 ENCOUNTER — Encounter: Payer: Self-pay | Admitting: Podiatry

## 2020-05-15 ENCOUNTER — Other Ambulatory Visit: Payer: Self-pay

## 2020-05-15 ENCOUNTER — Ambulatory Visit (INDEPENDENT_AMBULATORY_CARE_PROVIDER_SITE_OTHER): Payer: Medicare Other | Admitting: Podiatry

## 2020-05-15 DIAGNOSIS — L97511 Non-pressure chronic ulcer of other part of right foot limited to breakdown of skin: Secondary | ICD-10-CM

## 2020-05-15 MED ORDER — COLLAGENASE 250 UNIT/GM EX OINT: TOPICAL_OINTMENT | Freq: Every day | CUTANEOUS | 0 refills | Status: AC

## 2020-05-15 NOTE — Progress Notes (Signed)
  Subjective:  Patient ID: Timothy Faulkner, male    DOB: Jul 09, 1946,  MRN: [id number]  Chief Complaint  Patient presents with  . Wound Check    PT stated that he has no concerns and denies any pain     74 y.o. male presents for wound care. Hx confirmed with patient. Thinks the wound is still present. Objective:  Physical Exam: Wound Location: right 4th toe Wound Measurement: 0.5x0.5 Wound Base: Mixed Granular/Fibrotic Peri-wound: Normal Exudate: None: wound tissue dry wound without warmth, erythema, signs of acute infection  Assessment:   1. Skin ulcer of fourth toe, right, limited to breakdown of skin [facility])    Plan:  Patient was evaluated and treated and all questions answered.  Ulcer Right foot -No debridement today -Applied silvadene and band-aid. -Pt to apply santyl WTD daily. -Rx Santyl to [facility]  in [place]. -F/u in 3 weeks.  No follow-ups on file.

## 2020-05-16 DIAGNOSIS — N2581 Secondary hyperparathyroidism of renal origin: Secondary | ICD-10-CM | POA: Diagnosis not present

## 2020-05-16 DIAGNOSIS — Z992 Dependence on renal dialysis: Secondary | ICD-10-CM | POA: Diagnosis not present

## 2020-05-16 DIAGNOSIS — D689 Coagulation defect, unspecified: Secondary | ICD-10-CM | POA: Diagnosis not present

## 2020-05-16 DIAGNOSIS — N186 End stage renal disease: Secondary | ICD-10-CM | POA: Diagnosis not present

## 2020-05-16 DIAGNOSIS — E119 Type 2 diabetes mellitus without complications: Secondary | ICD-10-CM | POA: Diagnosis not present

## 2020-05-16 DIAGNOSIS — I10 Essential (primary) hypertension: Secondary | ICD-10-CM | POA: Diagnosis not present

## 2020-05-18 DIAGNOSIS — N186 End stage renal disease: Secondary | ICD-10-CM | POA: Diagnosis not present

## 2020-05-18 DIAGNOSIS — E119 Type 2 diabetes mellitus without complications: Secondary | ICD-10-CM | POA: Diagnosis not present

## 2020-05-18 DIAGNOSIS — Z992 Dependence on renal dialysis: Secondary | ICD-10-CM | POA: Diagnosis not present

## 2020-05-18 DIAGNOSIS — D689 Coagulation defect, unspecified: Secondary | ICD-10-CM | POA: Diagnosis not present

## 2020-05-18 DIAGNOSIS — N2581 Secondary hyperparathyroidism of renal origin: Secondary | ICD-10-CM | POA: Diagnosis not present

## 2020-05-18 DIAGNOSIS — I10 Essential (primary) hypertension: Secondary | ICD-10-CM | POA: Diagnosis not present

## 2020-05-19 DIAGNOSIS — Z89512 Acquired absence of left leg below knee: Secondary | ICD-10-CM | POA: Diagnosis not present

## 2020-05-19 DIAGNOSIS — Z9189 Other specified personal risk factors, not elsewhere classified: Secondary | ICD-10-CM | POA: Diagnosis not present

## 2020-05-19 DIAGNOSIS — L97512 Non-pressure chronic ulcer of other part of right foot with fat layer exposed: Secondary | ICD-10-CM | POA: Diagnosis not present

## 2020-05-21 DIAGNOSIS — Z992 Dependence on renal dialysis: Secondary | ICD-10-CM | POA: Diagnosis not present

## 2020-05-21 DIAGNOSIS — N2581 Secondary hyperparathyroidism of renal origin: Secondary | ICD-10-CM | POA: Diagnosis not present

## 2020-05-21 DIAGNOSIS — I10 Essential (primary) hypertension: Secondary | ICD-10-CM | POA: Diagnosis not present

## 2020-05-21 DIAGNOSIS — E119 Type 2 diabetes mellitus without complications: Secondary | ICD-10-CM | POA: Diagnosis not present

## 2020-05-21 DIAGNOSIS — N186 End stage renal disease: Secondary | ICD-10-CM | POA: Diagnosis not present

## 2020-05-21 DIAGNOSIS — D689 Coagulation defect, unspecified: Secondary | ICD-10-CM | POA: Diagnosis not present

## 2020-05-23 DIAGNOSIS — Z992 Dependence on renal dialysis: Secondary | ICD-10-CM | POA: Diagnosis not present

## 2020-05-23 DIAGNOSIS — N186 End stage renal disease: Secondary | ICD-10-CM | POA: Diagnosis not present

## 2020-05-23 DIAGNOSIS — I10 Essential (primary) hypertension: Secondary | ICD-10-CM | POA: Diagnosis not present

## 2020-05-23 DIAGNOSIS — D689 Coagulation defect, unspecified: Secondary | ICD-10-CM | POA: Diagnosis not present

## 2020-05-23 DIAGNOSIS — E119 Type 2 diabetes mellitus without complications: Secondary | ICD-10-CM | POA: Diagnosis not present

## 2020-05-23 DIAGNOSIS — N2581 Secondary hyperparathyroidism of renal origin: Secondary | ICD-10-CM | POA: Diagnosis not present

## 2020-05-25 DIAGNOSIS — N186 End stage renal disease: Secondary | ICD-10-CM | POA: Diagnosis not present

## 2020-05-25 DIAGNOSIS — E119 Type 2 diabetes mellitus without complications: Secondary | ICD-10-CM | POA: Diagnosis not present

## 2020-05-25 DIAGNOSIS — D689 Coagulation defect, unspecified: Secondary | ICD-10-CM | POA: Diagnosis not present

## 2020-05-25 DIAGNOSIS — N2581 Secondary hyperparathyroidism of renal origin: Secondary | ICD-10-CM | POA: Diagnosis not present

## 2020-05-25 DIAGNOSIS — Z992 Dependence on renal dialysis: Secondary | ICD-10-CM | POA: Diagnosis not present

## 2020-05-25 DIAGNOSIS — I10 Essential (primary) hypertension: Secondary | ICD-10-CM | POA: Diagnosis not present

## 2020-05-28 DIAGNOSIS — Z992 Dependence on renal dialysis: Secondary | ICD-10-CM | POA: Diagnosis not present

## 2020-05-28 DIAGNOSIS — N2581 Secondary hyperparathyroidism of renal origin: Secondary | ICD-10-CM | POA: Diagnosis not present

## 2020-05-28 DIAGNOSIS — E119 Type 2 diabetes mellitus without complications: Secondary | ICD-10-CM | POA: Diagnosis not present

## 2020-05-28 DIAGNOSIS — D689 Coagulation defect, unspecified: Secondary | ICD-10-CM | POA: Diagnosis not present

## 2020-05-28 DIAGNOSIS — N186 End stage renal disease: Secondary | ICD-10-CM | POA: Diagnosis not present

## 2020-05-28 DIAGNOSIS — I10 Essential (primary) hypertension: Secondary | ICD-10-CM | POA: Diagnosis not present

## 2020-05-29 ENCOUNTER — Ambulatory Visit (INDEPENDENT_AMBULATORY_CARE_PROVIDER_SITE_OTHER): Payer: Medicare Other | Admitting: Physician Assistant

## 2020-05-29 ENCOUNTER — Other Ambulatory Visit: Payer: Self-pay

## 2020-05-29 ENCOUNTER — Ambulatory Visit (HOSPITAL_COMMUNITY)
Admission: RE | Admit: 2020-05-29 | Discharge: 2020-05-29 | Disposition: A | Discharge status: Home / Self Care | Payer: Medicare Other | Source: Ambulatory Visit | Attending: Physician Assistant | Admitting: Physician Assistant

## 2020-05-29 VITALS — BP 133/89 | HR 80 | Temp 98.2°F | Resp 20 | Ht 68.0 in | Wt 172.0 lb

## 2020-05-29 DIAGNOSIS — Z89512 Acquired absence of left leg below knee: Secondary | ICD-10-CM

## 2020-05-29 DIAGNOSIS — I739 Peripheral vascular disease, unspecified: Secondary | ICD-10-CM

## 2020-05-29 NOTE — Progress Notes (Signed)
POST OPERATIVE OFFICE NOTE    CC:  F/u for surgery  HPI:  This is a 74 y.o. 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  He had what appeared to be a small retained Vicryl stitch at his last visit which was excised.  He has no complaints today.  He is accompanied by his daughter.   He has a history of right foot wound>> status post right fourth and fifth toe amputation managed by podiatry.  The nail of his third toe recently was injured and this was removed by his podiatrist.  No Known Allergies  Current Outpatient Medications  Medication Sig Dispense Refill  . acetaminophen (TYLENOL) 500 MG tablet Take 500 mg by mouth every 6 (six) hours as needed for mild pain or headache.     . Amino Acids-Protein Hydrolys (FEEDING SUPPLEMENT, PRO-STAT SUGAR FREE 64,) LIQD Take 30 mLs by mouth 2 (two) times daily. 887 mL 0  . AURYXIA 1 GM 210 MG(Fe) tablet Take 210 mg by mouth 3 (three) times daily.     . bisacodyl (DULCOLAX) 5 MG EC tablet Take 1 tablet (5 mg total) by mouth daily as needed for moderate constipation. 30 tablet 0  . calcitRIOL (ROCALTROL) 0.25 MCG capsule Take 0.25 mcg by mouth every Monday, Wednesday, and Friday.    . Calcium Carbonate Antacid (CALCIUM CARBONATE, DOSED IN MG ELEMENTAL CALCIUM,) 1250 MG/5ML SUSP Take 5 mLs (500 mg of elemental calcium total) by mouth every 6 (six) hours as needed for indigestion. 450 mL 0  . cinacalcet (SENSIPAR) 30 MG tablet Take 1 tablet (30 mg total) by mouth every other day. 60 tablet 0  . CLONIDINE HCL PO Take by mouth.    . clopidogrel (PLAVIX) 75 MG tablet Take 1 tablet (75 mg total) by mouth daily with breakfast. 30 tablet 0  . colchicine 0.6 MG tablet Take 0.6 mg by mouth daily as needed (as directed for gout flares).   1  . collagenase (SANTYL) ointment Apply topically daily. Wound measures 0.5x0.5. Apply to wound - edge to edge nickel thick.  Cover with wet gauze followed by dry gauze and change daily 15 g 0  . docusate  sodium (COLACE) 100 MG capsule Take 2 capsules (200 mg total) by mouth 2 (two) times daily. 10 capsule 0  . heparin 1000 unit/mL SOLN injection Heparin Sodium (Porcine) 1,000 Units/mL Systemic    . HYDROcodone-acetaminophen (NORCO) 5-325 MG tablet Take 1 tablet by mouth every 6 (six) hours as needed for moderate pain. 12 tablet 0  . lidocaine-prilocaine (EMLA) cream SMARTSIG:Sparingly Topical    . methocarbamol (ROBAXIN) 500 MG tablet Take 1 tablet (500 mg total) by mouth every 12 (twelve) hours as needed for muscle spasms. 60 tablet 0  . Methoxy PEG-Epoetin Beta (MIRCERA IJ) Mircera    . midodrine (PROAMATINE) 10 MG tablet Take 1 tablet (10 mg total) by mouth every Monday, Wednesday, and Friday with hemodialysis. 60 tablet 6  . oxyCODONE (OXY IR/ROXICODONE) 5 MG immediate release tablet Take by mouth.     . RENVELA 800 MG tablet Take 2 tablets by mouth in the morning, at noon, and at bedtime.    . rosuvastatin (CRESTOR) 10 MG tablet Take 1 tablet (10 mg total) by mouth daily at 6 PM. 30 tablet 0  . silver sulfADIAZINE (SILVADENE) 1 % cream Apply pea-sized amount to wound daily. 50 g 0   No current facility-administered medications for this visit.     ROS:  See HPI  BP 133/89 (BP Location: Right Leg, Patient Position: Sitting, Cuff Size: Normal)   Pulse 80   Temp 98.2 F (36.8 C) (Temporal)   Resp 20   Ht 5\' 8"  (1.727 m)   Wt 172 lb (78 kg)   SpO2 95%   BMI 26.15 kg/m   Physical Exam:  General appearance: Well-developed well-nourished in no apparent distress Cardiac: Rhythm regular Respiratory: Nonlabored Incision: Left lower extremity BKA incision well-healed Extremities: Left lower extremity: 2+ popliteal pulse.  Flaps warm and well-perfused.  Good knee extension Right lower extremity: Amputation site well-healed.  Toenail bed of third toe has a small amount of crust with a tiny bit of bleeding when compressed.  No purulence or tenderness.  Right foot is warm with intact  sensation. Neuro: Certain oriented x4  ABIs ABI Findings:  +---------+------------------+-----+----------+-----------------------+  Right  Rt Pressure (mmHg)IndexWaveform Comment          +---------+------------------+-----+----------+-----------------------+  Brachial                  AVG            +---------+------------------+-----+----------+-----------------------+  PTA   90        0.70 monophasicmay be falsely elevated  +---------+------------------+-----+----------+-----------------------+  DP    62        0.48 monophasic              +---------+------------------+-----+----------+-----------------------+  Great Toe49        0.38 Abnormal               +---------+------------------+-----+----------+-----------------------+   +--------+------------------+-----+--------+-------+  Left  Lt Pressure (mmHg)IndexWaveformComment  +--------+------------------+-----+--------+-------+  [id number]                     +--------+------------------+-----+--------+-------+  PTA                   BKA    +--------+------------------+-----+--------+-------+  DP                   BKA    +--------+------------------+-----+--------+-------+    Summary:  Right: Resting right ankle-brachial index indicates moderate right lower  extremity arterial disease; however, this may be inaccurate. Waveform and  index do not correlate. The right toe-brachial index is abnormal.   Assessment/Plan:  This is a 74 y.o. male who is s/p: Left below the knee amputation.  Status post right fourth and fifth ray amputation with recent trauma to third toe nailbed.  Podiatry remove the nail and are following.  I told the patient and his daughter they could proceed with appointment for first fitting for left below the knee  prosthesis.  Advised also to discuss weightbearing on the right with podiatrist.  Continue Plavix and statin. Follow-up in 6 months with ABIs   Risa Grill, PA-C [facility] [phone]  Clinic MD: Carlis Abbott

## 2020-05-30 DIAGNOSIS — E119 Type 2 diabetes mellitus without complications: Secondary | ICD-10-CM | POA: Diagnosis not present

## 2020-05-30 DIAGNOSIS — D689 Coagulation defect, unspecified: Secondary | ICD-10-CM | POA: Diagnosis not present

## 2020-05-30 DIAGNOSIS — Z992 Dependence on renal dialysis: Secondary | ICD-10-CM | POA: Diagnosis not present

## 2020-05-30 DIAGNOSIS — N2581 Secondary hyperparathyroidism of renal origin: Secondary | ICD-10-CM | POA: Diagnosis not present

## 2020-05-30 DIAGNOSIS — I10 Essential (primary) hypertension: Secondary | ICD-10-CM | POA: Diagnosis not present

## 2020-05-30 DIAGNOSIS — N186 End stage renal disease: Secondary | ICD-10-CM | POA: Diagnosis not present

## 2020-06-01 DIAGNOSIS — I10 Essential (primary) hypertension: Secondary | ICD-10-CM | POA: Diagnosis not present

## 2020-06-01 DIAGNOSIS — N2581 Secondary hyperparathyroidism of renal origin: Secondary | ICD-10-CM | POA: Diagnosis not present

## 2020-06-01 DIAGNOSIS — E119 Type 2 diabetes mellitus without complications: Secondary | ICD-10-CM | POA: Diagnosis not present

## 2020-06-01 DIAGNOSIS — D689 Coagulation defect, unspecified: Secondary | ICD-10-CM | POA: Diagnosis not present

## 2020-06-01 DIAGNOSIS — Z992 Dependence on renal dialysis: Secondary | ICD-10-CM | POA: Diagnosis not present

## 2020-06-01 DIAGNOSIS — N186 End stage renal disease: Secondary | ICD-10-CM | POA: Diagnosis not present

## 2020-06-04 DIAGNOSIS — N186 End stage renal disease: Secondary | ICD-10-CM | POA: Diagnosis not present

## 2020-06-04 DIAGNOSIS — N2581 Secondary hyperparathyroidism of renal origin: Secondary | ICD-10-CM | POA: Diagnosis not present

## 2020-06-04 DIAGNOSIS — D689 Coagulation defect, unspecified: Secondary | ICD-10-CM | POA: Diagnosis not present

## 2020-06-04 DIAGNOSIS — E119 Type 2 diabetes mellitus without complications: Secondary | ICD-10-CM | POA: Diagnosis not present

## 2020-06-04 DIAGNOSIS — Z992 Dependence on renal dialysis: Secondary | ICD-10-CM | POA: Diagnosis not present

## 2020-06-04 DIAGNOSIS — I10 Essential (primary) hypertension: Secondary | ICD-10-CM | POA: Diagnosis not present

## 2020-06-05 ENCOUNTER — Ambulatory Visit: Payer: Medicare Other | Admitting: Podiatry

## 2020-06-05 DIAGNOSIS — I871 Compression of vein: Secondary | ICD-10-CM | POA: Diagnosis not present

## 2020-06-05 DIAGNOSIS — Z992 Dependence on renal dialysis: Secondary | ICD-10-CM | POA: Diagnosis not present

## 2020-06-05 DIAGNOSIS — N186 End stage renal disease: Secondary | ICD-10-CM | POA: Diagnosis not present

## 2020-06-06 DIAGNOSIS — D689 Coagulation defect, unspecified: Secondary | ICD-10-CM | POA: Diagnosis not present

## 2020-06-06 DIAGNOSIS — N186 End stage renal disease: Secondary | ICD-10-CM | POA: Diagnosis not present

## 2020-06-06 DIAGNOSIS — I10 Essential (primary) hypertension: Secondary | ICD-10-CM | POA: Diagnosis not present

## 2020-06-06 DIAGNOSIS — N2581 Secondary hyperparathyroidism of renal origin: Secondary | ICD-10-CM | POA: Diagnosis not present

## 2020-06-06 DIAGNOSIS — E119 Type 2 diabetes mellitus without complications: Secondary | ICD-10-CM | POA: Diagnosis not present

## 2020-06-06 DIAGNOSIS — Z992 Dependence on renal dialysis: Secondary | ICD-10-CM | POA: Diagnosis not present

## 2020-06-08 ENCOUNTER — Ambulatory Visit: Payer: Medicare Other | Admitting: Podiatry

## 2020-06-08 DIAGNOSIS — E119 Type 2 diabetes mellitus without complications: Secondary | ICD-10-CM | POA: Diagnosis not present

## 2020-06-08 DIAGNOSIS — N2581 Secondary hyperparathyroidism of renal origin: Secondary | ICD-10-CM | POA: Diagnosis not present

## 2020-06-08 DIAGNOSIS — D689 Coagulation defect, unspecified: Secondary | ICD-10-CM | POA: Diagnosis not present

## 2020-06-08 DIAGNOSIS — Z992 Dependence on renal dialysis: Secondary | ICD-10-CM | POA: Diagnosis not present

## 2020-06-08 DIAGNOSIS — N186 End stage renal disease: Secondary | ICD-10-CM | POA: Diagnosis not present

## 2020-06-08 DIAGNOSIS — I10 Essential (primary) hypertension: Secondary | ICD-10-CM | POA: Diagnosis not present

## 2020-06-10 DIAGNOSIS — Z992 Dependence on renal dialysis: Secondary | ICD-10-CM | POA: Diagnosis not present

## 2020-06-10 DIAGNOSIS — N186 End stage renal disease: Secondary | ICD-10-CM | POA: Diagnosis not present

## 2020-06-10 DIAGNOSIS — I12 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 chronic kidney disease or end stage renal disease: Secondary | ICD-10-CM | POA: Diagnosis not present

## 2020-06-11 DIAGNOSIS — E119 Type 2 diabetes mellitus without complications: Secondary | ICD-10-CM | POA: Diagnosis not present

## 2020-06-11 DIAGNOSIS — N2581 Secondary hyperparathyroidism of renal origin: Secondary | ICD-10-CM | POA: Diagnosis not present

## 2020-06-11 DIAGNOSIS — D689 Coagulation defect, unspecified: Secondary | ICD-10-CM | POA: Diagnosis not present

## 2020-06-11 DIAGNOSIS — N186 End stage renal disease: Secondary | ICD-10-CM | POA: Diagnosis not present

## 2020-06-11 DIAGNOSIS — Z992 Dependence on renal dialysis: Secondary | ICD-10-CM | POA: Diagnosis not present

## 2020-06-12 ENCOUNTER — Ambulatory Visit (INDEPENDENT_AMBULATORY_CARE_PROVIDER_SITE_OTHER): Payer: Medicare Other | Admitting: Podiatry

## 2020-06-12 ENCOUNTER — Encounter: Payer: Self-pay | Admitting: Podiatry

## 2020-06-12 ENCOUNTER — Other Ambulatory Visit: Payer: Self-pay

## 2020-06-12 DIAGNOSIS — I739 Peripheral vascular disease, unspecified: Secondary | ICD-10-CM | POA: Diagnosis not present

## 2020-06-12 DIAGNOSIS — L97511 Non-pressure chronic ulcer of other part of right foot limited to breakdown of skin: Secondary | ICD-10-CM

## 2020-06-12 NOTE — Progress Notes (Signed)
  Subjective:  Patient ID: Timothy Faulkner, male    DOB: 1946/02/03,  MRN: [id number]  Chief Complaint  Patient presents with  . Nail Problem    Right 4th toe check. Pt denies fever/nausea/vomiting/chills. No new concerns.    74 y.o. male presents for wound care. Hx confirmed with patient. Thinks the wound is still present. Objective:  Physical Exam: Wound Location: right 4th toe Wound Measurement: 0.3x0.3 Wound Base: Mixed Granular/Fibrotic Peri-wound: Normal Exudate: None: wound tissue dry wound without warmth, erythema, signs of acute infection  Assessment:   1. Skin ulcer of fourth toe, right, limited to breakdown of skin ([place])   2. PAD (peripheral artery disease) ([place])    Plan:  Patient was evaluated and treated and all questions answered.  Ulcer Right foot -Slight eschar to wound, wound is slow to heal. -Switch to medihoney -Gently debridd today, non-procedural -F/u in 1 month for rechecl.  Return in about 1 month (around 07/12/2020) for Wound Care.

## 2020-06-13 DIAGNOSIS — E119 Type 2 diabetes mellitus without complications: Secondary | ICD-10-CM | POA: Diagnosis not present

## 2020-06-13 DIAGNOSIS — N186 End stage renal disease: Secondary | ICD-10-CM | POA: Diagnosis not present

## 2020-06-13 DIAGNOSIS — Z992 Dependence on renal dialysis: Secondary | ICD-10-CM | POA: Diagnosis not present

## 2020-06-13 DIAGNOSIS — D689 Coagulation defect, unspecified: Secondary | ICD-10-CM | POA: Diagnosis not present

## 2020-06-13 DIAGNOSIS — N2581 Secondary hyperparathyroidism of renal origin: Secondary | ICD-10-CM | POA: Diagnosis not present

## 2020-06-15 DIAGNOSIS — E119 Type 2 diabetes mellitus without complications: Secondary | ICD-10-CM | POA: Diagnosis not present

## 2020-06-15 DIAGNOSIS — Z992 Dependence on renal dialysis: Secondary | ICD-10-CM | POA: Diagnosis not present

## 2020-06-15 DIAGNOSIS — D689 Coagulation defect, unspecified: Secondary | ICD-10-CM | POA: Diagnosis not present

## 2020-06-15 DIAGNOSIS — N2581 Secondary hyperparathyroidism of renal origin: Secondary | ICD-10-CM | POA: Diagnosis not present

## 2020-06-15 DIAGNOSIS — N186 End stage renal disease: Secondary | ICD-10-CM | POA: Diagnosis not present

## 2020-06-18 DIAGNOSIS — E119 Type 2 diabetes mellitus without complications: Secondary | ICD-10-CM | POA: Diagnosis not present

## 2020-06-18 DIAGNOSIS — Z992 Dependence on renal dialysis: Secondary | ICD-10-CM | POA: Diagnosis not present

## 2020-06-18 DIAGNOSIS — N2581 Secondary hyperparathyroidism of renal origin: Secondary | ICD-10-CM | POA: Diagnosis not present

## 2020-06-18 DIAGNOSIS — N186 End stage renal disease: Secondary | ICD-10-CM | POA: Diagnosis not present

## 2020-06-18 DIAGNOSIS — D689 Coagulation defect, unspecified: Secondary | ICD-10-CM | POA: Diagnosis not present

## 2020-06-20 DIAGNOSIS — Z992 Dependence on renal dialysis: Secondary | ICD-10-CM | POA: Diagnosis not present

## 2020-06-20 DIAGNOSIS — D689 Coagulation defect, unspecified: Secondary | ICD-10-CM | POA: Diagnosis not present

## 2020-06-20 DIAGNOSIS — N2581 Secondary hyperparathyroidism of renal origin: Secondary | ICD-10-CM | POA: Diagnosis not present

## 2020-06-20 DIAGNOSIS — E119 Type 2 diabetes mellitus without complications: Secondary | ICD-10-CM | POA: Diagnosis not present

## 2020-06-20 DIAGNOSIS — N186 End stage renal disease: Secondary | ICD-10-CM | POA: Diagnosis not present

## 2020-06-22 DIAGNOSIS — D689 Coagulation defect, unspecified: Secondary | ICD-10-CM | POA: Diagnosis not present

## 2020-06-22 DIAGNOSIS — Z992 Dependence on renal dialysis: Secondary | ICD-10-CM | POA: Diagnosis not present

## 2020-06-22 DIAGNOSIS — N186 End stage renal disease: Secondary | ICD-10-CM | POA: Diagnosis not present

## 2020-06-22 DIAGNOSIS — E119 Type 2 diabetes mellitus without complications: Secondary | ICD-10-CM | POA: Diagnosis not present

## 2020-06-22 DIAGNOSIS — N2581 Secondary hyperparathyroidism of renal origin: Secondary | ICD-10-CM | POA: Diagnosis not present

## 2020-06-25 DIAGNOSIS — E119 Type 2 diabetes mellitus without complications: Secondary | ICD-10-CM | POA: Diagnosis not present

## 2020-06-25 DIAGNOSIS — N2581 Secondary hyperparathyroidism of renal origin: Secondary | ICD-10-CM | POA: Diagnosis not present

## 2020-06-25 DIAGNOSIS — Z992 Dependence on renal dialysis: Secondary | ICD-10-CM | POA: Diagnosis not present

## 2020-06-25 DIAGNOSIS — N186 End stage renal disease: Secondary | ICD-10-CM | POA: Diagnosis not present

## 2020-06-25 DIAGNOSIS — D689 Coagulation defect, unspecified: Secondary | ICD-10-CM | POA: Diagnosis not present

## 2020-06-27 DIAGNOSIS — N186 End stage renal disease: Secondary | ICD-10-CM | POA: Diagnosis not present

## 2020-06-27 DIAGNOSIS — N2581 Secondary hyperparathyroidism of renal origin: Secondary | ICD-10-CM | POA: Diagnosis not present

## 2020-06-27 DIAGNOSIS — E119 Type 2 diabetes mellitus without complications: Secondary | ICD-10-CM | POA: Diagnosis not present

## 2020-06-27 DIAGNOSIS — D689 Coagulation defect, unspecified: Secondary | ICD-10-CM | POA: Diagnosis not present

## 2020-06-27 DIAGNOSIS — Z992 Dependence on renal dialysis: Secondary | ICD-10-CM | POA: Diagnosis not present

## 2020-06-29 DIAGNOSIS — E119 Type 2 diabetes mellitus without complications: Secondary | ICD-10-CM | POA: Diagnosis not present

## 2020-06-29 DIAGNOSIS — N186 End stage renal disease: Secondary | ICD-10-CM | POA: Diagnosis not present

## 2020-06-29 DIAGNOSIS — Z992 Dependence on renal dialysis: Secondary | ICD-10-CM | POA: Diagnosis not present

## 2020-06-29 DIAGNOSIS — D689 Coagulation defect, unspecified: Secondary | ICD-10-CM | POA: Diagnosis not present

## 2020-06-29 DIAGNOSIS — Z89512 Acquired absence of left leg below knee: Secondary | ICD-10-CM | POA: Diagnosis not present

## 2020-06-29 DIAGNOSIS — N2581 Secondary hyperparathyroidism of renal origin: Secondary | ICD-10-CM | POA: Diagnosis not present

## 2020-07-01 DIAGNOSIS — N186 End stage renal disease: Secondary | ICD-10-CM | POA: Diagnosis not present

## 2020-07-01 DIAGNOSIS — D689 Coagulation defect, unspecified: Secondary | ICD-10-CM | POA: Diagnosis not present

## 2020-07-01 DIAGNOSIS — N2581 Secondary hyperparathyroidism of renal origin: Secondary | ICD-10-CM | POA: Diagnosis not present

## 2020-07-01 DIAGNOSIS — E119 Type 2 diabetes mellitus without complications: Secondary | ICD-10-CM | POA: Diagnosis not present

## 2020-07-01 DIAGNOSIS — Z992 Dependence on renal dialysis: Secondary | ICD-10-CM | POA: Diagnosis not present

## 2020-07-03 DIAGNOSIS — N2581 Secondary hyperparathyroidism of renal origin: Secondary | ICD-10-CM | POA: Diagnosis not present

## 2020-07-03 DIAGNOSIS — N186 End stage renal disease: Secondary | ICD-10-CM | POA: Diagnosis not present

## 2020-07-03 DIAGNOSIS — D689 Coagulation defect, unspecified: Secondary | ICD-10-CM | POA: Diagnosis not present

## 2020-07-03 DIAGNOSIS — E119 Type 2 diabetes mellitus without complications: Secondary | ICD-10-CM | POA: Diagnosis not present

## 2020-07-03 DIAGNOSIS — Z992 Dependence on renal dialysis: Secondary | ICD-10-CM | POA: Diagnosis not present

## 2020-07-06 DIAGNOSIS — D689 Coagulation defect, unspecified: Secondary | ICD-10-CM | POA: Diagnosis not present

## 2020-07-06 DIAGNOSIS — E119 Type 2 diabetes mellitus without complications: Secondary | ICD-10-CM | POA: Diagnosis not present

## 2020-07-06 DIAGNOSIS — N186 End stage renal disease: Secondary | ICD-10-CM | POA: Diagnosis not present

## 2020-07-06 DIAGNOSIS — Z992 Dependence on renal dialysis: Secondary | ICD-10-CM | POA: Diagnosis not present

## 2020-07-06 DIAGNOSIS — N2581 Secondary hyperparathyroidism of renal origin: Secondary | ICD-10-CM | POA: Diagnosis not present

## 2020-07-09 DIAGNOSIS — N2581 Secondary hyperparathyroidism of renal origin: Secondary | ICD-10-CM | POA: Diagnosis not present

## 2020-07-09 DIAGNOSIS — Z992 Dependence on renal dialysis: Secondary | ICD-10-CM | POA: Diagnosis not present

## 2020-07-09 DIAGNOSIS — D689 Coagulation defect, unspecified: Secondary | ICD-10-CM | POA: Diagnosis not present

## 2020-07-09 DIAGNOSIS — N186 End stage renal disease: Secondary | ICD-10-CM | POA: Diagnosis not present

## 2020-07-09 DIAGNOSIS — E119 Type 2 diabetes mellitus without complications: Secondary | ICD-10-CM | POA: Diagnosis not present

## 2020-07-10 DIAGNOSIS — N186 End stage renal disease: Secondary | ICD-10-CM | POA: Diagnosis not present

## 2020-07-10 DIAGNOSIS — I12 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 chronic kidney disease or end stage renal disease: Secondary | ICD-10-CM | POA: Diagnosis not present

## 2020-07-10 DIAGNOSIS — Z992 Dependence on renal dialysis: Secondary | ICD-10-CM | POA: Diagnosis not present

## 2020-07-11 DIAGNOSIS — D689 Coagulation defect, unspecified: Secondary | ICD-10-CM | POA: Diagnosis not present

## 2020-07-11 DIAGNOSIS — Z992 Dependence on renal dialysis: Secondary | ICD-10-CM | POA: Diagnosis not present

## 2020-07-11 DIAGNOSIS — E119 Type 2 diabetes mellitus without complications: Secondary | ICD-10-CM | POA: Diagnosis not present

## 2020-07-11 DIAGNOSIS — N2581 Secondary hyperparathyroidism of renal origin: Secondary | ICD-10-CM | POA: Diagnosis not present

## 2020-07-11 DIAGNOSIS — N186 End stage renal disease: Secondary | ICD-10-CM | POA: Diagnosis not present

## 2020-07-13 DIAGNOSIS — D689 Coagulation defect, unspecified: Secondary | ICD-10-CM | POA: Diagnosis not present

## 2020-07-13 DIAGNOSIS — N2581 Secondary hyperparathyroidism of renal origin: Secondary | ICD-10-CM | POA: Diagnosis not present

## 2020-07-13 DIAGNOSIS — Z992 Dependence on renal dialysis: Secondary | ICD-10-CM | POA: Diagnosis not present

## 2020-07-13 DIAGNOSIS — E119 Type 2 diabetes mellitus without complications: Secondary | ICD-10-CM | POA: Diagnosis not present

## 2020-07-13 DIAGNOSIS — N186 End stage renal disease: Secondary | ICD-10-CM | POA: Diagnosis not present

## 2020-07-16 DIAGNOSIS — E119 Type 2 diabetes mellitus without complications: Secondary | ICD-10-CM | POA: Diagnosis not present

## 2020-07-16 DIAGNOSIS — N186 End stage renal disease: Secondary | ICD-10-CM | POA: Diagnosis not present

## 2020-07-16 DIAGNOSIS — N2581 Secondary hyperparathyroidism of renal origin: Secondary | ICD-10-CM | POA: Diagnosis not present

## 2020-07-16 DIAGNOSIS — Z992 Dependence on renal dialysis: Secondary | ICD-10-CM | POA: Diagnosis not present

## 2020-07-16 DIAGNOSIS — D689 Coagulation defect, unspecified: Secondary | ICD-10-CM | POA: Diagnosis not present

## 2020-07-17 ENCOUNTER — Ambulatory Visit (INDEPENDENT_AMBULATORY_CARE_PROVIDER_SITE_OTHER): Payer: Medicare Other | Admitting: Podiatry

## 2020-07-17 ENCOUNTER — Other Ambulatory Visit: Payer: Self-pay

## 2020-07-17 DIAGNOSIS — I739 Peripheral vascular disease, unspecified: Secondary | ICD-10-CM | POA: Diagnosis not present

## 2020-07-17 DIAGNOSIS — S90221A Contusion of right lesser toe(s) with damage to nail, initial encounter: Secondary | ICD-10-CM | POA: Diagnosis not present

## 2020-07-17 DIAGNOSIS — L97511 Non-pressure chronic ulcer of other part of right foot limited to breakdown of skin: Secondary | ICD-10-CM

## 2020-07-17 NOTE — Progress Notes (Signed)
  Subjective:  Patient ID: Timothy Faulkner, male    DOB: 07-14-1946,  MRN: [id number]  Chief Complaint  Patient presents with  . Wound Check    Right 2nd digit bruise at tip, right 4th nail check    74 y.o. male presents for wound care. Hx confirmed with patient. Thinks the wound is still present. Objective:  Physical Exam: Wound Location: right 4th toe Wound Measurement: 0.3x0.3  Wound Base: Mixed Granular/Fibrotic Peri-wound: Normal Exudate: None: wound tissue dry wound without warmth, erythema, signs of acute infection  Assessment:   1. Skin ulcer of fourth toe, right, limited to breakdown of skin ([place])   2. PAD (peripheral artery disease) ([facility])   3. Subungual hematoma of toe of right foot, initial encounter    Plan:  Patient was evaluated and treated and all questions answered.  Ulcer Right foot -Wound continues to be slow to heal. -Minimally debrided -Dressed with silvadene today. Start santyl. -Dispensed toe cap to protect toes from shoes.  Return in about 3 weeks (around 08/07/2020).

## 2020-07-18 DIAGNOSIS — D689 Coagulation defect, unspecified: Secondary | ICD-10-CM | POA: Diagnosis not present

## 2020-07-18 DIAGNOSIS — N2581 Secondary hyperparathyroidism of renal origin: Secondary | ICD-10-CM | POA: Diagnosis not present

## 2020-07-18 DIAGNOSIS — N186 End stage renal disease: Secondary | ICD-10-CM | POA: Diagnosis not present

## 2020-07-18 DIAGNOSIS — Z992 Dependence on renal dialysis: Secondary | ICD-10-CM | POA: Diagnosis not present

## 2020-07-18 DIAGNOSIS — E119 Type 2 diabetes mellitus without complications: Secondary | ICD-10-CM | POA: Diagnosis not present

## 2020-07-20 DIAGNOSIS — E119 Type 2 diabetes mellitus without complications: Secondary | ICD-10-CM | POA: Diagnosis not present

## 2020-07-20 DIAGNOSIS — N2581 Secondary hyperparathyroidism of renal origin: Secondary | ICD-10-CM | POA: Diagnosis not present

## 2020-07-20 DIAGNOSIS — D689 Coagulation defect, unspecified: Secondary | ICD-10-CM | POA: Diagnosis not present

## 2020-07-20 DIAGNOSIS — Z992 Dependence on renal dialysis: Secondary | ICD-10-CM | POA: Diagnosis not present

## 2020-07-20 DIAGNOSIS — N186 End stage renal disease: Secondary | ICD-10-CM | POA: Diagnosis not present

## 2020-07-23 ENCOUNTER — Ambulatory Visit (INDEPENDENT_AMBULATORY_CARE_PROVIDER_SITE_OTHER): Payer: Medicare Other

## 2020-07-23 DIAGNOSIS — E119 Type 2 diabetes mellitus without complications: Secondary | ICD-10-CM | POA: Diagnosis not present

## 2020-07-23 DIAGNOSIS — Z992 Dependence on renal dialysis: Secondary | ICD-10-CM | POA: Diagnosis not present

## 2020-07-23 DIAGNOSIS — I420 Dilated cardiomyopathy: Secondary | ICD-10-CM | POA: Diagnosis not present

## 2020-07-23 DIAGNOSIS — N2581 Secondary hyperparathyroidism of renal origin: Secondary | ICD-10-CM | POA: Diagnosis not present

## 2020-07-23 DIAGNOSIS — N186 End stage renal disease: Secondary | ICD-10-CM | POA: Diagnosis not present

## 2020-07-23 DIAGNOSIS — D689 Coagulation defect, unspecified: Secondary | ICD-10-CM | POA: Diagnosis not present

## 2020-07-23 LAB — CUP PACEART REMOTE DEVICE CHECK
Battery Remaining Longevity: 42 mo
Battery Remaining Percentage: 44 %
Brady Statistic RV Percent Paced: 0 %
Date Time Interrogation Session: 20211213013100
HighPow Impedance: 129 Ohm
Implantable Lead Implant Date: 20111118
Implantable Lead Location: 753860
Implantable Lead Model: 181
Implantable Lead Serial Number: 313758
Implantable Pulse Generator Implant Date: 20111118
Lead Channel Impedance Value: 630 Ohm
Lead Channel Pacing Threshold Amplitude: 0.7 V
Lead Channel Pacing Threshold Pulse Width: 0.5 ms
Lead Channel Setting Pacing Amplitude: 2.4 V
Lead Channel Setting Pacing Pulse Width: 0.5 ms
Lead Channel Setting Sensing Sensitivity: 0.5 mV
Pulse Gen Serial Number: 268731

## 2020-07-25 DIAGNOSIS — E119 Type 2 diabetes mellitus without complications: Secondary | ICD-10-CM | POA: Diagnosis not present

## 2020-07-25 DIAGNOSIS — D689 Coagulation defect, unspecified: Secondary | ICD-10-CM | POA: Diagnosis not present

## 2020-07-25 DIAGNOSIS — N186 End stage renal disease: Secondary | ICD-10-CM | POA: Diagnosis not present

## 2020-07-25 DIAGNOSIS — N2581 Secondary hyperparathyroidism of renal origin: Secondary | ICD-10-CM | POA: Diagnosis not present

## 2020-07-25 DIAGNOSIS — Z992 Dependence on renal dialysis: Secondary | ICD-10-CM | POA: Diagnosis not present

## 2020-07-26 DIAGNOSIS — Z992 Dependence on renal dialysis: Secondary | ICD-10-CM | POA: Diagnosis not present

## 2020-07-26 DIAGNOSIS — T82858A Stenosis of vascular prosthetic devices, implants and grafts, initial encounter: Secondary | ICD-10-CM | POA: Diagnosis not present

## 2020-07-26 DIAGNOSIS — N186 End stage renal disease: Secondary | ICD-10-CM | POA: Diagnosis not present

## 2020-07-27 DIAGNOSIS — D689 Coagulation defect, unspecified: Secondary | ICD-10-CM | POA: Diagnosis not present

## 2020-07-27 DIAGNOSIS — N2581 Secondary hyperparathyroidism of renal origin: Secondary | ICD-10-CM | POA: Diagnosis not present

## 2020-07-27 DIAGNOSIS — N186 End stage renal disease: Secondary | ICD-10-CM | POA: Diagnosis not present

## 2020-07-27 DIAGNOSIS — E119 Type 2 diabetes mellitus without complications: Secondary | ICD-10-CM | POA: Diagnosis not present

## 2020-07-27 DIAGNOSIS — Z992 Dependence on renal dialysis: Secondary | ICD-10-CM | POA: Diagnosis not present

## 2020-07-29 DIAGNOSIS — Z89512 Acquired absence of left leg below knee: Secondary | ICD-10-CM | POA: Diagnosis not present

## 2020-07-30 DIAGNOSIS — D689 Coagulation defect, unspecified: Secondary | ICD-10-CM | POA: Diagnosis not present

## 2020-07-30 DIAGNOSIS — Z992 Dependence on renal dialysis: Secondary | ICD-10-CM | POA: Diagnosis not present

## 2020-07-30 DIAGNOSIS — N2581 Secondary hyperparathyroidism of renal origin: Secondary | ICD-10-CM | POA: Diagnosis not present

## 2020-07-30 DIAGNOSIS — N186 End stage renal disease: Secondary | ICD-10-CM | POA: Diagnosis not present

## 2020-07-30 DIAGNOSIS — E119 Type 2 diabetes mellitus without complications: Secondary | ICD-10-CM | POA: Diagnosis not present

## 2020-08-01 DIAGNOSIS — D689 Coagulation defect, unspecified: Secondary | ICD-10-CM | POA: Diagnosis not present

## 2020-08-01 DIAGNOSIS — N2581 Secondary hyperparathyroidism of renal origin: Secondary | ICD-10-CM | POA: Diagnosis not present

## 2020-08-01 DIAGNOSIS — E119 Type 2 diabetes mellitus without complications: Secondary | ICD-10-CM | POA: Diagnosis not present

## 2020-08-01 DIAGNOSIS — Z992 Dependence on renal dialysis: Secondary | ICD-10-CM | POA: Diagnosis not present

## 2020-08-01 DIAGNOSIS — N186 End stage renal disease: Secondary | ICD-10-CM | POA: Diagnosis not present

## 2020-08-03 DIAGNOSIS — E119 Type 2 diabetes mellitus without complications: Secondary | ICD-10-CM | POA: Diagnosis not present

## 2020-08-03 DIAGNOSIS — N2581 Secondary hyperparathyroidism of renal origin: Secondary | ICD-10-CM | POA: Diagnosis not present

## 2020-08-03 DIAGNOSIS — Z992 Dependence on renal dialysis: Secondary | ICD-10-CM | POA: Diagnosis not present

## 2020-08-03 DIAGNOSIS — N186 End stage renal disease: Secondary | ICD-10-CM | POA: Diagnosis not present

## 2020-08-03 DIAGNOSIS — D689 Coagulation defect, unspecified: Secondary | ICD-10-CM | POA: Diagnosis not present

## 2020-08-06 DIAGNOSIS — E119 Type 2 diabetes mellitus without complications: Secondary | ICD-10-CM | POA: Diagnosis not present

## 2020-08-06 DIAGNOSIS — N186 End stage renal disease: Secondary | ICD-10-CM | POA: Diagnosis not present

## 2020-08-06 DIAGNOSIS — Z992 Dependence on renal dialysis: Secondary | ICD-10-CM | POA: Diagnosis not present

## 2020-08-06 DIAGNOSIS — N2581 Secondary hyperparathyroidism of renal origin: Secondary | ICD-10-CM | POA: Diagnosis not present

## 2020-08-06 DIAGNOSIS — D689 Coagulation defect, unspecified: Secondary | ICD-10-CM | POA: Diagnosis not present

## 2020-08-07 ENCOUNTER — Ambulatory Visit: Payer: Medicare Other | Admitting: Orthotics

## 2020-08-07 ENCOUNTER — Ambulatory Visit (INDEPENDENT_AMBULATORY_CARE_PROVIDER_SITE_OTHER): Payer: Medicare Other | Admitting: Podiatry

## 2020-08-07 ENCOUNTER — Other Ambulatory Visit: Payer: Self-pay

## 2020-08-07 DIAGNOSIS — L97511 Non-pressure chronic ulcer of other part of right foot limited to breakdown of skin: Secondary | ICD-10-CM

## 2020-08-07 DIAGNOSIS — S90221A Contusion of right lesser toe(s) with damage to nail, initial encounter: Secondary | ICD-10-CM

## 2020-08-07 DIAGNOSIS — I739 Peripheral vascular disease, unspecified: Secondary | ICD-10-CM

## 2020-08-07 NOTE — Progress Notes (Signed)
  Subjective:  Patient ID: Timothy Faulkner, male    DOB: 1945/10/07,  MRN: [id number]  Chief Complaint  Patient presents with  . Wound Check    3 week f/u for right foot skin ulcer. No NV, fever or chills.    74 y.o. male presents for wound care. Hx confirmed with patient. Has been using Santyl. Denies new issues. Objective:  Physical Exam: Wound Location: right 4th toe Wound Measurement: 0.3x0.3  Wound Base: Mixed Granular/Fibrotic Peri-wound: Normal Exudate: None: wound tissue dry wound without warmth, erythema, signs of acute infection  Assessment:   1. Skin ulcer of fourth toe, right, limited to breakdown of skin ([place])   2. PAD (peripheral artery disease) ([facility])   3. Subungual hematoma of toe of right foot, initial encounter    Plan:  Patient was evaluated and treated and all questions answered.  Ulcer Right foot -Wound continues to be slow to heal. -Minimally debrided today with tissue nipper. Non-procedural -Continue santyl. -Dressed with Medihoney and DSD today.  No follow-ups on file.

## 2020-08-07 NOTE — Progress Notes (Signed)
Remote ICD transmission.   

## 2020-08-07 NOTE — Progress Notes (Signed)
Took material off plantar surface of shoe to make more room.

## 2020-08-08 DIAGNOSIS — D689 Coagulation defect, unspecified: Secondary | ICD-10-CM | POA: Diagnosis not present

## 2020-08-08 DIAGNOSIS — E119 Type 2 diabetes mellitus without complications: Secondary | ICD-10-CM | POA: Diagnosis not present

## 2020-08-08 DIAGNOSIS — N186 End stage renal disease: Secondary | ICD-10-CM | POA: Diagnosis not present

## 2020-08-08 DIAGNOSIS — N2581 Secondary hyperparathyroidism of renal origin: Secondary | ICD-10-CM | POA: Diagnosis not present

## 2020-08-08 DIAGNOSIS — Z992 Dependence on renal dialysis: Secondary | ICD-10-CM | POA: Diagnosis not present

## 2020-08-10 DIAGNOSIS — N186 End stage renal disease: Secondary | ICD-10-CM | POA: Diagnosis not present

## 2020-08-10 DIAGNOSIS — I12 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 chronic kidney disease or end stage renal disease: Secondary | ICD-10-CM | POA: Diagnosis not present

## 2020-08-10 DIAGNOSIS — E119 Type 2 diabetes mellitus without complications: Secondary | ICD-10-CM | POA: Diagnosis not present

## 2020-08-10 DIAGNOSIS — Z992 Dependence on renal dialysis: Secondary | ICD-10-CM | POA: Diagnosis not present

## 2020-08-10 DIAGNOSIS — N2581 Secondary hyperparathyroidism of renal origin: Secondary | ICD-10-CM | POA: Diagnosis not present

## 2020-08-10 DIAGNOSIS — D689 Coagulation defect, unspecified: Secondary | ICD-10-CM | POA: Diagnosis not present

## 2020-08-13 DIAGNOSIS — N2581 Secondary hyperparathyroidism of renal origin: Secondary | ICD-10-CM | POA: Diagnosis not present

## 2020-08-13 DIAGNOSIS — Z992 Dependence on renal dialysis: Secondary | ICD-10-CM | POA: Diagnosis not present

## 2020-08-13 DIAGNOSIS — N186 End stage renal disease: Secondary | ICD-10-CM | POA: Diagnosis not present

## 2020-08-13 DIAGNOSIS — E119 Type 2 diabetes mellitus without complications: Secondary | ICD-10-CM | POA: Diagnosis not present

## 2020-08-13 DIAGNOSIS — D689 Coagulation defect, unspecified: Secondary | ICD-10-CM | POA: Diagnosis not present

## 2020-08-15 DIAGNOSIS — Z992 Dependence on renal dialysis: Secondary | ICD-10-CM | POA: Diagnosis not present

## 2020-08-15 DIAGNOSIS — E119 Type 2 diabetes mellitus without complications: Secondary | ICD-10-CM | POA: Diagnosis not present

## 2020-08-15 DIAGNOSIS — N186 End stage renal disease: Secondary | ICD-10-CM | POA: Diagnosis not present

## 2020-08-15 DIAGNOSIS — N2581 Secondary hyperparathyroidism of renal origin: Secondary | ICD-10-CM | POA: Diagnosis not present

## 2020-08-15 DIAGNOSIS — D689 Coagulation defect, unspecified: Secondary | ICD-10-CM | POA: Diagnosis not present

## 2020-08-17 DIAGNOSIS — Z992 Dependence on renal dialysis: Secondary | ICD-10-CM | POA: Diagnosis not present

## 2020-08-17 DIAGNOSIS — E119 Type 2 diabetes mellitus without complications: Secondary | ICD-10-CM | POA: Diagnosis not present

## 2020-08-17 DIAGNOSIS — N2581 Secondary hyperparathyroidism of renal origin: Secondary | ICD-10-CM | POA: Diagnosis not present

## 2020-08-17 DIAGNOSIS — D689 Coagulation defect, unspecified: Secondary | ICD-10-CM | POA: Diagnosis not present

## 2020-08-17 DIAGNOSIS — N186 End stage renal disease: Secondary | ICD-10-CM | POA: Diagnosis not present

## 2020-08-20 DIAGNOSIS — N186 End stage renal disease: Secondary | ICD-10-CM | POA: Diagnosis not present

## 2020-08-20 DIAGNOSIS — Z992 Dependence on renal dialysis: Secondary | ICD-10-CM | POA: Diagnosis not present

## 2020-08-20 DIAGNOSIS — N2581 Secondary hyperparathyroidism of renal origin: Secondary | ICD-10-CM | POA: Diagnosis not present

## 2020-08-20 DIAGNOSIS — E119 Type 2 diabetes mellitus without complications: Secondary | ICD-10-CM | POA: Diagnosis not present

## 2020-08-20 DIAGNOSIS — D689 Coagulation defect, unspecified: Secondary | ICD-10-CM | POA: Diagnosis not present

## 2020-08-22 DIAGNOSIS — D689 Coagulation defect, unspecified: Secondary | ICD-10-CM | POA: Diagnosis not present

## 2020-08-22 DIAGNOSIS — N186 End stage renal disease: Secondary | ICD-10-CM | POA: Diagnosis not present

## 2020-08-22 DIAGNOSIS — Z992 Dependence on renal dialysis: Secondary | ICD-10-CM | POA: Diagnosis not present

## 2020-08-22 DIAGNOSIS — N2581 Secondary hyperparathyroidism of renal origin: Secondary | ICD-10-CM | POA: Diagnosis not present

## 2020-08-22 DIAGNOSIS — E119 Type 2 diabetes mellitus without complications: Secondary | ICD-10-CM | POA: Diagnosis not present

## 2020-08-24 DIAGNOSIS — E119 Type 2 diabetes mellitus without complications: Secondary | ICD-10-CM | POA: Diagnosis not present

## 2020-08-24 DIAGNOSIS — N186 End stage renal disease: Secondary | ICD-10-CM | POA: Diagnosis not present

## 2020-08-24 DIAGNOSIS — Z992 Dependence on renal dialysis: Secondary | ICD-10-CM | POA: Diagnosis not present

## 2020-08-24 DIAGNOSIS — D689 Coagulation defect, unspecified: Secondary | ICD-10-CM | POA: Diagnosis not present

## 2020-08-24 DIAGNOSIS — N2581 Secondary hyperparathyroidism of renal origin: Secondary | ICD-10-CM | POA: Diagnosis not present

## 2020-08-28 ENCOUNTER — Ambulatory Visit: Payer: Medicare Other | Admitting: Podiatry

## 2020-08-28 DIAGNOSIS — Z992 Dependence on renal dialysis: Secondary | ICD-10-CM | POA: Diagnosis not present

## 2020-08-28 DIAGNOSIS — N186 End stage renal disease: Secondary | ICD-10-CM | POA: Diagnosis not present

## 2020-08-28 DIAGNOSIS — D689 Coagulation defect, unspecified: Secondary | ICD-10-CM | POA: Diagnosis not present

## 2020-08-28 DIAGNOSIS — E119 Type 2 diabetes mellitus without complications: Secondary | ICD-10-CM | POA: Diagnosis not present

## 2020-08-28 DIAGNOSIS — N2581 Secondary hyperparathyroidism of renal origin: Secondary | ICD-10-CM | POA: Diagnosis not present

## 2020-08-29 DIAGNOSIS — E119 Type 2 diabetes mellitus without complications: Secondary | ICD-10-CM | POA: Diagnosis not present

## 2020-08-29 DIAGNOSIS — N186 End stage renal disease: Secondary | ICD-10-CM | POA: Diagnosis not present

## 2020-08-29 DIAGNOSIS — D689 Coagulation defect, unspecified: Secondary | ICD-10-CM | POA: Diagnosis not present

## 2020-08-29 DIAGNOSIS — N2581 Secondary hyperparathyroidism of renal origin: Secondary | ICD-10-CM | POA: Diagnosis not present

## 2020-08-29 DIAGNOSIS — Z992 Dependence on renal dialysis: Secondary | ICD-10-CM | POA: Diagnosis not present

## 2020-08-29 DIAGNOSIS — Z89512 Acquired absence of left leg below knee: Secondary | ICD-10-CM | POA: Diagnosis not present

## 2020-08-31 DIAGNOSIS — N2581 Secondary hyperparathyroidism of renal origin: Secondary | ICD-10-CM | POA: Diagnosis not present

## 2020-08-31 DIAGNOSIS — D689 Coagulation defect, unspecified: Secondary | ICD-10-CM | POA: Diagnosis not present

## 2020-08-31 DIAGNOSIS — E119 Type 2 diabetes mellitus without complications: Secondary | ICD-10-CM | POA: Diagnosis not present

## 2020-08-31 DIAGNOSIS — N186 End stage renal disease: Secondary | ICD-10-CM | POA: Diagnosis not present

## 2020-08-31 DIAGNOSIS — Z992 Dependence on renal dialysis: Secondary | ICD-10-CM | POA: Diagnosis not present

## 2020-09-03 DIAGNOSIS — N2581 Secondary hyperparathyroidism of renal origin: Secondary | ICD-10-CM | POA: Diagnosis not present

## 2020-09-03 DIAGNOSIS — E119 Type 2 diabetes mellitus without complications: Secondary | ICD-10-CM | POA: Diagnosis not present

## 2020-09-03 DIAGNOSIS — Z992 Dependence on renal dialysis: Secondary | ICD-10-CM | POA: Diagnosis not present

## 2020-09-03 DIAGNOSIS — N186 End stage renal disease: Secondary | ICD-10-CM | POA: Diagnosis not present

## 2020-09-03 DIAGNOSIS — D689 Coagulation defect, unspecified: Secondary | ICD-10-CM | POA: Diagnosis not present

## 2020-09-04 ENCOUNTER — Ambulatory Visit: Payer: Medicare Other | Admitting: Podiatry

## 2020-09-05 DIAGNOSIS — Z992 Dependence on renal dialysis: Secondary | ICD-10-CM | POA: Diagnosis not present

## 2020-09-05 DIAGNOSIS — E119 Type 2 diabetes mellitus without complications: Secondary | ICD-10-CM | POA: Diagnosis not present

## 2020-09-05 DIAGNOSIS — N186 End stage renal disease: Secondary | ICD-10-CM | POA: Diagnosis not present

## 2020-09-05 DIAGNOSIS — D689 Coagulation defect, unspecified: Secondary | ICD-10-CM | POA: Diagnosis not present

## 2020-09-05 DIAGNOSIS — N2581 Secondary hyperparathyroidism of renal origin: Secondary | ICD-10-CM | POA: Diagnosis not present

## 2020-09-06 DIAGNOSIS — Z89512 Acquired absence of left leg below knee: Secondary | ICD-10-CM | POA: Diagnosis not present

## 2020-09-07 DIAGNOSIS — D689 Coagulation defect, unspecified: Secondary | ICD-10-CM | POA: Diagnosis not present

## 2020-09-07 DIAGNOSIS — E119 Type 2 diabetes mellitus without complications: Secondary | ICD-10-CM | POA: Diagnosis not present

## 2020-09-07 DIAGNOSIS — Z992 Dependence on renal dialysis: Secondary | ICD-10-CM | POA: Diagnosis not present

## 2020-09-07 DIAGNOSIS — N2581 Secondary hyperparathyroidism of renal origin: Secondary | ICD-10-CM | POA: Diagnosis not present

## 2020-09-07 DIAGNOSIS — N186 End stage renal disease: Secondary | ICD-10-CM | POA: Diagnosis not present

## 2020-09-10 DIAGNOSIS — E119 Type 2 diabetes mellitus without complications: Secondary | ICD-10-CM | POA: Diagnosis not present

## 2020-09-10 DIAGNOSIS — N2581 Secondary hyperparathyroidism of renal origin: Secondary | ICD-10-CM | POA: Diagnosis not present

## 2020-09-10 DIAGNOSIS — I12 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 chronic kidney disease or end stage renal disease: Secondary | ICD-10-CM | POA: Diagnosis not present

## 2020-09-10 DIAGNOSIS — D689 Coagulation defect, unspecified: Secondary | ICD-10-CM | POA: Diagnosis not present

## 2020-09-10 DIAGNOSIS — N186 End stage renal disease: Secondary | ICD-10-CM | POA: Diagnosis not present

## 2020-09-10 DIAGNOSIS — Z992 Dependence on renal dialysis: Secondary | ICD-10-CM | POA: Diagnosis not present

## 2020-09-11 ENCOUNTER — Encounter: Payer: Self-pay | Admitting: Podiatry

## 2020-09-11 ENCOUNTER — Other Ambulatory Visit: Payer: Self-pay

## 2020-09-11 ENCOUNTER — Ambulatory Visit (INDEPENDENT_AMBULATORY_CARE_PROVIDER_SITE_OTHER): Payer: Medicare Other | Admitting: Podiatry

## 2020-09-11 DIAGNOSIS — L97511 Non-pressure chronic ulcer of other part of right foot limited to breakdown of skin: Secondary | ICD-10-CM | POA: Diagnosis not present

## 2020-09-11 DIAGNOSIS — I739 Peripheral vascular disease, unspecified: Secondary | ICD-10-CM

## 2020-09-11 NOTE — Progress Notes (Signed)
  Subjective:  Patient ID: Timothy Faulkner, male    DOB: [birth year],  MRN: [id number]  Chief Complaint  Patient presents with  . Wound Check    Follow up skin ulcer on 4th toe of right foot. Pt. Has no complaints.    75 y.o. male presents for wound care. Hx confirmed with patient. Has been using Santyl. Daughter Timothy Faulkner has noticed new sore to the tip and bottom of the 2nd toe. Objective:  Physical Exam: Wound Location: right 4th toe Wound Measurement: 0.2x0.2  Wound Base: Mixed Granular/Fibrotic Peri-wound: Normal Exudate: None: wound tissue dry wound without warmth, erythema, signs of acute infection  Small soft eschar distal 2nd toe, small skin excoriation 2nd toe plantarmedial  Neither wound with warmth, erythema, sign of acute infection noted.  Assessment:   1. Skin ulcer of fourth toe, right, limited to breakdown of skin ([place])   2. PAD (peripheral artery disease) ([place])    Plan:  Patient was evaluated and treated and all questions answered.  Ulcer Right foot -Wound doing ok, still rather slow to heal. Additional new soft eschar 2nd toe -Switch to medihoney today. -Minimally debrided today with tissue nipper. Non-procedural -Dressed with Medihoney and gauze and tape. Change daily.  Return in about 4 weeks (around 10/09/2020) for Wound Care.

## 2020-09-12 DIAGNOSIS — Z992 Dependence on renal dialysis: Secondary | ICD-10-CM | POA: Diagnosis not present

## 2020-09-12 DIAGNOSIS — N2581 Secondary hyperparathyroidism of renal origin: Secondary | ICD-10-CM | POA: Diagnosis not present

## 2020-09-12 DIAGNOSIS — N186 End stage renal disease: Secondary | ICD-10-CM | POA: Diagnosis not present

## 2020-09-12 DIAGNOSIS — Z23 Encounter for immunization: Secondary | ICD-10-CM | POA: Diagnosis not present

## 2020-09-12 DIAGNOSIS — E119 Type 2 diabetes mellitus without complications: Secondary | ICD-10-CM | POA: Diagnosis not present

## 2020-09-12 DIAGNOSIS — D689 Coagulation defect, unspecified: Secondary | ICD-10-CM | POA: Diagnosis not present

## 2020-09-14 DIAGNOSIS — Z992 Dependence on renal dialysis: Secondary | ICD-10-CM | POA: Diagnosis not present

## 2020-09-14 DIAGNOSIS — D689 Coagulation defect, unspecified: Secondary | ICD-10-CM | POA: Diagnosis not present

## 2020-09-14 DIAGNOSIS — N2581 Secondary hyperparathyroidism of renal origin: Secondary | ICD-10-CM | POA: Diagnosis not present

## 2020-09-14 DIAGNOSIS — Z23 Encounter for immunization: Secondary | ICD-10-CM | POA: Diagnosis not present

## 2020-09-14 DIAGNOSIS — E119 Type 2 diabetes mellitus without complications: Secondary | ICD-10-CM | POA: Diagnosis not present

## 2020-09-14 DIAGNOSIS — N186 End stage renal disease: Secondary | ICD-10-CM | POA: Diagnosis not present

## 2020-09-17 DIAGNOSIS — N2581 Secondary hyperparathyroidism of renal origin: Secondary | ICD-10-CM | POA: Diagnosis not present

## 2020-09-17 DIAGNOSIS — Z23 Encounter for immunization: Secondary | ICD-10-CM | POA: Diagnosis not present

## 2020-09-17 DIAGNOSIS — N186 End stage renal disease: Secondary | ICD-10-CM | POA: Diagnosis not present

## 2020-09-17 DIAGNOSIS — D689 Coagulation defect, unspecified: Secondary | ICD-10-CM | POA: Diagnosis not present

## 2020-09-17 DIAGNOSIS — E119 Type 2 diabetes mellitus without complications: Secondary | ICD-10-CM | POA: Diagnosis not present

## 2020-09-17 DIAGNOSIS — Z992 Dependence on renal dialysis: Secondary | ICD-10-CM | POA: Diagnosis not present

## 2020-09-19 DIAGNOSIS — Z23 Encounter for immunization: Secondary | ICD-10-CM | POA: Diagnosis not present

## 2020-09-19 DIAGNOSIS — E119 Type 2 diabetes mellitus without complications: Secondary | ICD-10-CM | POA: Diagnosis not present

## 2020-09-19 DIAGNOSIS — D689 Coagulation defect, unspecified: Secondary | ICD-10-CM | POA: Diagnosis not present

## 2020-09-19 DIAGNOSIS — N186 End stage renal disease: Secondary | ICD-10-CM | POA: Diagnosis not present

## 2020-09-19 DIAGNOSIS — N2581 Secondary hyperparathyroidism of renal origin: Secondary | ICD-10-CM | POA: Diagnosis not present

## 2020-09-19 DIAGNOSIS — Z992 Dependence on renal dialysis: Secondary | ICD-10-CM | POA: Diagnosis not present

## 2020-09-21 DIAGNOSIS — N186 End stage renal disease: Secondary | ICD-10-CM | POA: Diagnosis not present

## 2020-09-21 DIAGNOSIS — Z992 Dependence on renal dialysis: Secondary | ICD-10-CM | POA: Diagnosis not present

## 2020-09-21 DIAGNOSIS — Z23 Encounter for immunization: Secondary | ICD-10-CM | POA: Diagnosis not present

## 2020-09-21 DIAGNOSIS — D689 Coagulation defect, unspecified: Secondary | ICD-10-CM | POA: Diagnosis not present

## 2020-09-21 DIAGNOSIS — E119 Type 2 diabetes mellitus without complications: Secondary | ICD-10-CM | POA: Diagnosis not present

## 2020-09-21 DIAGNOSIS — N2581 Secondary hyperparathyroidism of renal origin: Secondary | ICD-10-CM | POA: Diagnosis not present

## 2020-09-24 DIAGNOSIS — N2581 Secondary hyperparathyroidism of renal origin: Secondary | ICD-10-CM | POA: Diagnosis not present

## 2020-09-24 DIAGNOSIS — Z23 Encounter for immunization: Secondary | ICD-10-CM | POA: Diagnosis not present

## 2020-09-24 DIAGNOSIS — N186 End stage renal disease: Secondary | ICD-10-CM | POA: Diagnosis not present

## 2020-09-24 DIAGNOSIS — E119 Type 2 diabetes mellitus without complications: Secondary | ICD-10-CM | POA: Diagnosis not present

## 2020-09-24 DIAGNOSIS — D689 Coagulation defect, unspecified: Secondary | ICD-10-CM | POA: Diagnosis not present

## 2020-09-24 DIAGNOSIS — Z992 Dependence on renal dialysis: Secondary | ICD-10-CM | POA: Diagnosis not present

## 2020-09-25 ENCOUNTER — Other Ambulatory Visit: Payer: Self-pay

## 2020-09-25 ENCOUNTER — Ambulatory Visit (INDEPENDENT_AMBULATORY_CARE_PROVIDER_SITE_OTHER): Payer: Medicare Other | Admitting: Physical Therapy

## 2020-09-25 ENCOUNTER — Encounter: Payer: Self-pay | Admitting: Physical Therapy

## 2020-09-25 DIAGNOSIS — R293 Abnormal posture: Secondary | ICD-10-CM

## 2020-09-25 DIAGNOSIS — M6281 Muscle weakness (generalized): Secondary | ICD-10-CM

## 2020-09-25 DIAGNOSIS — R2689 Other abnormalities of gait and mobility: Secondary | ICD-10-CM | POA: Diagnosis not present

## 2020-09-25 DIAGNOSIS — R2681 Unsteadiness on feet: Secondary | ICD-10-CM | POA: Diagnosis not present

## 2020-09-25 DIAGNOSIS — M25661 Stiffness of right knee, not elsewhere classified: Secondary | ICD-10-CM | POA: Diagnosis not present

## 2020-09-25 NOTE — Therapy (Signed)
[facility] Physical Therapy [address] [place], [place], [address] Phone: [phone]   Fax:  [phone]  Physical Therapy Evaluation  Patient Details  Name: Timothy Faulkner MRN: [id number] Date of Birth: Apr 03, 1946 Referring Provider (PT): Monica Martinez, MD   Encounter Date: 09/25/2020   PT End of Session - 09/25/20 2112    Visit Number 1    Number of Visits 25    Date for PT Re-Evaluation 12/24/20    Authorization Type UHC Medicare    PT Start Time 1436    PT Stop Time 1515    PT Time Calculation (min) 39 min    Equipment Utilized During Treatment Gait belt    Activity Tolerance Patient tolerated treatment well;Patient limited by fatigue    Behavior During Therapy [facility] for tasks assessed/performed           Past Medical History:  Diagnosis Date  . Anemia   . Arthritis    Gout  . Automatic implantable cardioverter-defibrillator in situ    [employer]  . CHF   . ESRD on dialysis [facility])    [name] 03/11/2013 (03/11/2013) dialysis M/W/F  . Gangrene ([place])    left foot  . GERD (gastroesophageal reflux disease)   . Gout    "once a year"  . Heart murmur   . HYPERCHOLESTEROLEMIA, MIXED   . HYPERTENSION   . Macular degeneration   . Osteomyelitis ([place])    right foot  . 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 Pacemaker   . Peripheral arterial disease (HCC)    left fifth toe ulcer, healing  . Pneumonia   . Shortness of breath   . 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 Wears dentures   . Wears glasses     Past Surgical History:  Procedure Laterality Date  . A/V FISTULAGRAM Left 07/20/2018   Procedure: A/V FISTULAGRAM;  Surgeon: [name], MD;  Location: [place] [facility];  Service: Cardiovascular;  Laterality: Left;  . A/V FISTULAGRAM N/A 07/15/2019   Procedure: A/V FISTULAGRAM - Left Arm;  Surgeon: Elam Dutch, MD;  Location: [place] [facility];  Service: Cardiovascular;   Laterality: N/A;  . ABDOMINAL AORTOGRAM N/A 02/25/2019   Procedure: ABDOMINAL AORTOGRAM;  Surgeon: Elam Dutch, MD;  Location: [place] [facility];  Service: Cardiovascular;  Laterality: N/A;  . ABDOMINAL AORTOGRAM W/LOWER EXTREMITY Bilateral 07/20/2018   Procedure: ABDOMINAL AORTOGRAM W/LOWER EXTREMITY;  Surgeon: [name], MD;  Location: [place] [facility];  Service: Cardiovascular;  Laterality: Bilateral;  . AMPUTATION Right 09/07/2018   Procedure: Right fifth metatarsectomy;  Surgeon: [name], DPM;  Location: [place];  Service: Podiatry;  Laterality: Right;  . AMPUTATION Left 03/08/2019   Procedure: AMPUTATION  3RD AND 4TH TOES LEFT FOOT;  Surgeon: [name], DPM;  Location: [place];  Service: Podiatry;  Laterality: Left;  . AMPUTATION Left 03/15/2019   Procedure: AMPUTATION BELOW KNEE;  Surgeon: Elam Dutch, MD;  Location: [facility] OR;  Service: Vascular;  Laterality: Left;  . AV FISTULA PLACEMENT Right 12/13/2012   Procedure: ARTERIOVENOUS (AV) FISTULA CREATION;  Surgeon: [name], MD;  Location: [place];  Service: Vascular;  Laterality: Right;  Ultrasound guided  . AV FISTULA PLACEMENT Left 05/07/2016   Procedure: LEFT RADIOCEPHALIC ARTERIOVENOUS (AV) FISTULA CREATION;  Surgeon: [name], MD;  Location: [place];  Service: Vascular;  Laterality: Left;  . AV FISTULA PLACEMENT  Right 08/09/2019   Procedure: INSERTION OF RIGHT ARTERIOVENOUS (AV) 4-104mm GORE-TEX STRETCH GRAFT RIGHT  ARM;  Surgeon: Elam Dutch, MD;  Location: [place];  Service: Vascular;  Laterality: Right;  . BASCILIC VEIN TRANSPOSITION Right 03/26/2016   Procedure: RIGHT BASILIC VEIN TRANSPOSITION;  Surgeon: [name], MD;  Location: [place];  Service: Vascular;  Laterality: Right;  . CARDIAC CATHETERIZATION    . CARDIAC DEFIBRILLATOR PLACEMENT     Chemical engineer  . CATARACT EXTRACTION W/PHACO Bilateral   . EYE SURGERY Bilateral    Cataract  . FISTULOGRAM Left 04/22/2018   Procedure: FISTULOGRAM  UPPER EXTREMITY;  Surgeon: [name], MD;  Location: [place];  Service: Vascular;  Laterality: Left;  . I & D EXTREMITY Right 07/15/2018   Procedure: IRRIGATION AND DEBRIDEMENT RIGHT FOOT;  Surgeon: [name], DPM;  Location: [place];  Service: Podiatry;  Laterality: Right;  . INCISION AND DRAINAGE ABSCESS / HEMATOMA OF BURSA / KNEE / THIGH Left 2012   "knee" (03/11/2013)  . INSERT / REPLACE / REMOVE PACEMAKER    . INSERTION OF DIALYSIS CATHETER Left 04/22/2018   Procedure: INSERTION OF DIALYSIS CATHETER;  Surgeon: [name], MD;  Location: [place];  Service: Vascular;  Laterality: Left;  . IR FLUORO GUIDE CV LINE LEFT  07/15/2018  . IR PTA VENOUS EXCEPT DIALYSIS CIRCUIT  07/15/2018  . LOWER EXTREMITY ANGIOGRAPHY Right 07/21/2018   Procedure: LOWER EXTREMITY ANGIOGRAPHY;  Surgeon: [name], MD;  Location: [place] [facility];  Service: Cardiovascular;  Laterality: Right;  . LOWER EXTREMITY ANGIOGRAPHY Bilateral 02/25/2019   Procedure: Lower Extremity Angiography;  Surgeon: Elam Dutch, MD;  Location: [place] [facility];  Service: Cardiovascular;  Laterality: Bilateral;  . METATARSAL HEAD EXCISION Right 07/15/2018   Procedure: METATARSAL RESECTION;  Surgeon: [name], DPM;  Location: [place];  Service: Podiatry;  Laterality: Right;  . MULTIPLE TOOTH EXTRACTIONS    . PERIPHERAL VASCULAR ATHERECTOMY Right 02/28/2019   Procedure: PERIPHERAL VASCULAR ATHERECTOMY;  Surgeon: Waynetta Sandy, MD;  Location: [place] [facility];  Service: Cardiovascular;  Laterality: Right;  right tp trunk   . PERIPHERAL VASCULAR BALLOON ANGIOPLASTY Left 02/25/2019   Procedure: PERIPHERAL VASCULAR BALLOON ANGIOPLASTY;  Surgeon: Elam Dutch, MD;  Location: [place] [facility];  Service: Cardiovascular;  Laterality: Left;  tibial peroneal trunk and PT  . PERIPHERAL VASCULAR INTERVENTION Right 07/21/2018   Procedure: PERIPHERAL VASCULAR INTERVENTION;  Surgeon: [name], MD;  Location: [place] [facility];  Service: Cardiovascular;  Laterality: Right;  peroneal stents  . REVISON OF ARTERIOVENOUS FISTULA Right 4/00/8676   Procedure: Plication OF Right Arm ARTERIOVENOUS FISTULA;  Surgeon: Elam Dutch, MD;  Location: [facility] OR;  Service: Vascular;  Laterality: Right;  . REVISON OF ARTERIOVENOUS FISTULA Left 04/22/2018   Procedure: REVISION OF RADIOCEPHALIC ARTERIOVENOUS FISTULA;  Surgeon: [name], MD;  Location: [place];  Service: Vascular;  Laterality: Left;  . SHUNTOGRAM N/A 05/31/2013   Procedure: Earney Mallet;  Surgeon: [name], MD;  Location: [facility] C[facility] LAB;  Service: Cardiovascular;  Laterality: N/A;  . UPPER EXTREMITY VENOGRAPHY Right 07/23/2018   Procedure: UPPER EXTREMITY VENOGRAPHY;  Surgeon: [name], MD;  Location: [place] [facility];  Service: Cardiovascular;  Laterality: Right;  . UPPER EXTREMITY VENOGRAPHY  07/15/2019   Procedure: UPPER EXTREMITY VENOGRAPHY;  Surgeon: Elam Dutch, MD;  Location: [place] [facility];  Service: Cardiovascular;;  rt arm   .  WOUND DEBRIDEMENT Right 07/17/2018   Procedure: Wound Debridement; Closure Filleted toe flap Right Foot;  Surgeon: [name], DPM;  Location: [place];  Service: Podiatry;  Laterality: Right;  . WOUND DEBRIDEMENT Right 09/07/2018   Procedure: Debridement Right Foot Wound, application of wound vac;  Surgeon: [name], DPM;  Location: [place];  Service: Podiatry;  Laterality: Right;  . WOUND DEBRIDEMENT Right 03/08/2019   Procedure: DEBRIDEMENT WOUND RIGHT FOOT;  Surgeon: [name], DPM;  Location: [place];  Service: Podiatry;  Laterality: Right;    There were no vitals filed for this visit.    Subjective Assessment - 09/25/20 1441    Subjective This 75yo male was referred to PT by Monica Martinez, MD with Left Below Knee Amputation performed on 03/15/2019.  He received his prosthesis 09/06/2020 from [employer]. He has a wound on right 4th  toe followed by wound center.    Pertinent History PAD, DM2, arthritis, gout, CHF, implanted cardioverter-defib, HTN, CKD w/ ESRD    Patient Stated Goals to use prosthesis to walk in community,    Currently in Pain? No/denies              [facility] PT Assessment - 09/25/20 1436      Assessment   Medical Diagnosis Left Transtibial Amputation    Referring Provider (PT) Monica Martinez, MD    Onset Date/Surgical Date 09/06/20   prosthesis delivered   Hand Dominance Right    Prior Therapy HHPT immediately after amputation      Precautions   Precautions Fall;Other (comment)    Precaution Comments No BP in either UE, use right thigh      Restrictions   Weight Bearing Restrictions No      Balance Screen   Has the patient fallen in the past 6 months No    Has the patient had a decrease in activity level because of a fear of falling?  Yes    Is the patient reluctant to leave their home because of a fear of falling?  No   uses w/c     Home Environment   Living Environment Private residence    Living Arrangements Spouse/significant other;Children   adult dtr   Type of [place] entrance   over single step   [place] One level    [place] - 2 wheels;Bedside commode;Wheelchair - manual;Hospital bed      Prior Function   Level of Independence Independent;Independent with household mobility without device;Independent with community mobility with device    Vocation Retired      Art therapist   Posture/Postural Control Postural limitations    Postural Limitations Rounded Shoulders;Forward head;Flexed trunk;Weight shift right      ROM / Strength   AROM / PROM / Strength PROM;Strength      PROM   Overall PROM  Deficits    Overall PROM Comments hip extension standing -25* bilatrerally    Right Knee Extension -11    Left Knee Extension -15      Strength   Overall Strength Deficits    Strength Assessment Site Hip;Knee;Ankle    Right  Hip Flexion 4/5    Right Hip Extension 3-/5    Right Hip ABduction 3/5    Left Hip Flexion 4/5    Left Hip Extension 3-/5    Left Hip ABduction 3/5    Right Knee Flexion 4/5    Right Knee Extension 4/5    Left  Knee Flexion 3+/5    Left Knee Extension 3+/5    Right Ankle Dorsiflexion 3+/5      Transfers   Transfers Sit to Stand;Stand to Sit    Sit to Stand 5: Supervision;With upper extremity assist;With armrests;From chair/3-in-1;Other (comment)   to RW for stabilization   Stand to Sit 5: Supervision;With upper extremity assist;With armrests;To chair/3-in-1;Other (comment)   RW for stability     Ambulation/Gait   Ambulation/Gait Yes    Ambulation/Gait Assistance 4: Min assist    Ambulation/Gait Assistance Details excessive UE weight bearing on RW with partial weight on prosthesis.    Ambulation Distance (Feet) 15 Feet    Assistive device Rolling walker;Prosthesis    Gait Pattern Step-to pattern;Decreased step length - right;Decreased stance time - left;Decreased stride length;Decreased hip/knee flexion - left;Decreased weight shift to left;Left hip hike;Trunk flexed;Abducted - left;Poor foot clearance - right    Ambulation Surface Level;Indoor      Standardized Balance Assessment   Standardized Balance Assessment Berg Balance Test      Berg Balance Test   Sit to Stand Needs minimal aid to stand or to stabilize    Standing Unsupported Needs several tries to stand 30 seconds unsupported    Sitting with Back Unsupported but Feet Supported on Floor or Stool Able to sit safely and securely 2 minutes    Stand to Sit Controls descent by using hands    Transfers Able to transfer safely, definite need of hands    Standing Unsupported with Eyes Closed Unable to keep eyes closed 3 seconds but stays steady    Standing Unsupported with Feet Together Needs help to attain position and unable to hold for 15 seconds    From Standing, Reach Forward with Outstretched Arm Reaches forward but needs  supervision    From Standing Position, Pick up Object from Floor Unable to try/needs assist to keep balance    From Standing Position, Turn to Look Behind Over each Shoulder Needs supervision when turning    Turn 360 Degrees Needs assistance while turning    Standing Unsupported, Alternately Place Feet on Step/Stool Needs assistance to keep from falling or unable to try    Standing Unsupported, One Foot in Front Loses balance while stepping or standing    Standing on One Leg Unable to try or needs assist to prevent fall    Total Score 15           Prosthetics Assessment - 09/25/20 1436      Prosthetics   Prosthetic Care Dependent with Skin check;Residual limb care;Care of non-amputated limb;Prosthetic cleaning;Ply sock cleaning;Correct ply sock adjustment;Proper wear schedule/adjustment;Proper weight-bearing schedule/adjustment    Donning prosthesis  Mod assist    Doffing prosthesis  Min assist    Current prosthetic wear tolerance (days/week)  0 of 19 days since delivery    Current prosthetic wear tolerance (#hours/day)  30 min during evaluation    Current prosthetic weight-bearing tolerance (hours/day)  Patient tolerated 5 min standing & gait without c/o limb pain or discomfort    Edema pitting edema    Residual limb condition  No open areas, dry skin, normal temperature, normal color, cylinerical shape    Prosthesis Description silicon liner with shuttle pin lock, secondary pelite foam liner, total contact socket, SACH foot                     Objective measurements completed on examination: See above findings.       [facility] Adult PT  Treatment/Exercise - 09/25/20 1436      Prosthetics   Prosthetic Care Comments  sit with prosthesis supported on floor or other surface. wear prosthesis 2 hrs 2x/day sitting only.    Education Provided Skin check;Residual limb care;Prosthetic cleaning;Proper Donning;Proper Doffing;Proper wear schedule/adjustment    Person(s) Educated  Patient;Child(ren)    Education Method Explanation;Demonstration;Tactile cues;Verbal cues    Education Method Verbalized understanding;Tactile cues required;Verbal cues required;Needs further instruction                    PT Short Term Goals - 09/25/20 2128      PT SHORT TERM GOAL #1   Title Patient donnes prosthesis with verbal cues only.    Time 4    Period Weeks    Status New    Target Date 10/25/20      PT SHORT TERM GOAL #2   Title Patient tolerates prosthesis wear >8hrs total on nondialysis days.    Time 4    Period Weeks    Status New    Target Date 10/25/20      PT SHORT TERM GOAL #3   Title Patient standing balance with RW & prosthesis reaching 7" anteriorly & within 10" of floor with supervision.    Time 4    Period Weeks    Status New    Target Date 10/25/20      PT SHORT TERM GOAL #4   Title Patient ambulates 46" with RW & prosthesis with minA.    Time 4    Period Weeks    Status New    Target Date 10/25/20             PT Long Term Goals - 09/25/20 2123      PT LONG TERM GOAL #1   Title Patient & family verbalize & demonstrate understanding of proper prosthetic care to enable utilization of prosthesis.    Time 12    Period Weeks    Status New    Target Date 12/20/20      PT LONG TERM GOAL #2   Title Patient tolerates wear of prosthesis >90% of awake hours without skin or limb discomfort issues.    Time 12    Period Weeks    Status New    Target Date 12/20/20      PT LONG TERM GOAL #3   Title Berg Balance >/= 36/56    Time 12    Period Weeks    Status New    Target Date 12/20/20      PT LONG TERM GOAL #4   Title Patient ambulates 200' with RW & prosthesis modified independent.    Time 12    Period Weeks    Status New    Target Date 12/20/20      PT LONG TERM GOAL #5   Title Patient negotiates ramps & curbs with RW & prosthesis with minA.    Time 12    Period Weeks    Status New    Target Date 12/20/20                   Plan - 09/25/20 2114    Clinical Impression Statement This 75yo male underwent a left Transtibial Amputation on 03/15/2019 with slow delayed healing and received his prosthesis on 09/06/2020. He has decreased range & strength with deconditioning from prolonged limited moblity and dialysis >10 years.  Patient and daughter are dependent in proper prosthetic care.  He has not  worn prosthesis since delivery 19 days prior to PT evaluation.  Berg Balance 15/56 indicates high fall risk and dependency in standing ADLs.  His prosthetic gait requires minA wtih excessive UE weight bearing on RW and significant gait deviations indicating high fall risk.  Patient would benefit from skilled PT to improve safe prosthesis use & function.    Personal Factors and Comorbidities Comorbidity 3+;Fitness;Time since onset of injury/illness/exacerbation    Comorbidities PAD, DM2, arthritis, gout, CHF, implanted cardioverter-defib, HTN, CKD w/ ESRD, blind left knee    Examination-Activity Limitations Locomotion Level;Stairs;Stand;Transfers    Examination-Participation Restrictions Community Activity    Stability/Clinical Decision Making Evolving/Moderate complexity    Clinical Decision Making Moderate    Rehab Potential Good    PT Frequency 2x / week    PT Duration 12 weeks    PT Treatment/Interventions ADLs/Self Care Home Management;DME Instruction;Gait training;Stair training;Functional mobility training;Therapeutic activities;Therapeutic exercise;Balance training;Neuromuscular re-education;Patient/family education;Prosthetic Training;Manual techniques;Vestibular    PT Next Visit Plan review prosthetic care, instruct in HEP at sink, prosthetic gait with RW    Consulted and Agree with Plan of Care Patient;Family member/caregiver    Family Member Consulted daughter,Sherry Cira Servant           Patient will benefit from skilled therapeutic intervention in order to improve the following deficits and impairments:   Abnormal gait,Cardiopulmonary status limiting activity,Decreased activity tolerance,Decreased balance,Decreased endurance,Decreased knowledge of use of DME,Decreased mobility,Decreased range of motion,Decreased scar mobility,Decreased strength,Increased edema,Postural dysfunction,Prosthetic Dependency  Visit Diagnosis: Unsteadiness on feet  Other abnormalities of gait and mobility  Muscle weakness (generalized)  Stiffness of right knee, not elsewhere classified  Abnormal posture     Problem List Patient Active Problem List   Diagnosis Date Noted  . Polycythemia, secondary 02/21/2020  . Severe nonproliferative diabetic retinopathy of right eye without macular edema associated with type 2 diabetes mellitus ([place]) 11/29/2019  . Severe nonproliferative diabetic retinopathy of left eye without macular edema associated with type 2 diabetes mellitus ([place]) 11/29/2019  . Advanced nonexudative age-related macular degeneration of both eyes with subfoveal involvement 11/29/2019  . 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  . Sepsis ([place]) 04/14/2019  . Hypocalcemia 04/08/2019  . Drug induced constipation   . Anemia of chronic disease   . ESRD on dialysis ([place])   . Postoperative pain   . Phantom limb pain ([place])   . S/P BKA (below knee amputation), left ([place]) 03/18/2019  . Unilateral complete BKA, left, initial encounter ([place]) 03/17/2019  . Critical lower limb ischemia ([place]) 03/12/2019  . Infection of amputation stump, right lower extremity ([place]) 03/10/2019  . Hypotension 02/23/2019  . Cellulitis of left foot 02/23/2019  . Gangrene of toe of left foot ([place]) 02/22/2019  . Complication of vascular dialysis catheter 11/24/2018  . Chronic osteomyelitis involving right ankle and foot ([place])   . Ulcer of right foot with fat layer exposed ([place]) 09/02/2018  . Encounter for planned post-operative wound closure   . Acute osteomyelitis of metatarsal bone of right foot ([place])   . Diabetic  ulcer of midfoot associated with diabetes mellitus due to underlying condition, with necrosis of bone ([place])   . Osteomyelitis of right foot ([place])   . Vascular calcification   . Cellulitis and abscess of foot, except toes   . Anemia associated with chronic renal failure 07/13/2018  . Cardiomyopathy, dilated, nonischemic ([place]) 07/13/2018  . Diabetic foot infection ([place]) 07/13/2018  . PAD (peripheral artery disease) ([place])   . Macular degeneration   . Ventricular tachycardia ([place]) 06/24/2017  .  Embolism due to vascular prosthetic devices, implants and grafts, initial encounter ([place]) 02/04/2016  . Dependence on renal dialysis ([place]) 05/30/2015  . Shortness of breath 02/05/2015  . Chronic systolic heart failure ([place]) 08/22/2014  . Diabetic infection of right foot ([place]) 07/13/2014  . Type 2 diabetes mellitus with diabetic peripheral angiopathy without gangrene ([place]) 03/18/2014  . Anxiety disorder, unspecified 02/23/2014  . Fever, unspecified 02/23/2014  . Headache 02/23/2014  . Pain, unspecified 02/23/2014  . Pruritus, unspecified 02/23/2014  . Mild protein-calorie malnutrition ([place]) 12/26/2013  . ESRD (end stage renal disease) on dialysis ([place]) 11/11/2013  . Snoring 11/11/2013  . Unspecified sleep apnea 11/11/2013  . Other pancytopenia ([place]) 09/22/2013  . Hemoptysis 09/20/2013  . Personal history of nicotine dependence 07/12/2013  . Coagulation defect, unspecified ([place]) 05/16/2013  . Iron deficiency anemia, unspecified 05/06/2013  . Other disorders of plasma-protein metabolism, not elsewhere classified 04/15/2013  . 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 chronic kidney disease or end stage renal disease ([place]) 03/18/2013  . Other cardiomyopathies ([place]) 03/18/2013  . Secondary hyperparathyroidism of renal origin ([place]) 03/18/2013  . Type 2 diabetes mellitus without complications ([place]) 36/62/9476  . Pre-transplant evaluation for kidney transplant 01/11/2013  . Automatic implantable  cardioverter-defibrillator in situ 10/01/2010  . HYPERCHOLESTEROLEMIA, MIXED 05/03/2010  . Essential hypertension 05/03/2010  . CHF 05/03/2010    Jamey Reas, PT, DPT 09/25/2020, 9:31 PM  [facility] Physical Therapy [address] [place], [place], [address] Phone: [phone]   Fax:  [phone]  Name: Timothy Faulkner MRN: [id number] Date of Birth: October 04, 1945

## 2020-09-26 DIAGNOSIS — D689 Coagulation defect, unspecified: Secondary | ICD-10-CM | POA: Diagnosis not present

## 2020-09-26 DIAGNOSIS — Z23 Encounter for immunization: Secondary | ICD-10-CM | POA: Diagnosis not present

## 2020-09-26 DIAGNOSIS — E119 Type 2 diabetes mellitus without complications: Secondary | ICD-10-CM | POA: Diagnosis not present

## 2020-09-26 DIAGNOSIS — N186 End stage renal disease: Secondary | ICD-10-CM | POA: Diagnosis not present

## 2020-09-26 DIAGNOSIS — N2581 Secondary hyperparathyroidism of renal origin: Secondary | ICD-10-CM | POA: Diagnosis not present

## 2020-09-26 DIAGNOSIS — Z992 Dependence on renal dialysis: Secondary | ICD-10-CM | POA: Diagnosis not present

## 2020-09-28 DIAGNOSIS — Z992 Dependence on renal dialysis: Secondary | ICD-10-CM | POA: Diagnosis not present

## 2020-09-28 DIAGNOSIS — E119 Type 2 diabetes mellitus without complications: Secondary | ICD-10-CM | POA: Diagnosis not present

## 2020-09-28 DIAGNOSIS — N2581 Secondary hyperparathyroidism of renal origin: Secondary | ICD-10-CM | POA: Diagnosis not present

## 2020-09-28 DIAGNOSIS — N186 End stage renal disease: Secondary | ICD-10-CM | POA: Diagnosis not present

## 2020-09-28 DIAGNOSIS — Z23 Encounter for immunization: Secondary | ICD-10-CM | POA: Diagnosis not present

## 2020-09-28 DIAGNOSIS — D689 Coagulation defect, unspecified: Secondary | ICD-10-CM | POA: Diagnosis not present

## 2020-09-29 DIAGNOSIS — Z89512 Acquired absence of left leg below knee: Secondary | ICD-10-CM | POA: Diagnosis not present

## 2020-10-01 DIAGNOSIS — D689 Coagulation defect, unspecified: Secondary | ICD-10-CM | POA: Diagnosis not present

## 2020-10-01 DIAGNOSIS — N2581 Secondary hyperparathyroidism of renal origin: Secondary | ICD-10-CM | POA: Diagnosis not present

## 2020-10-01 DIAGNOSIS — Z23 Encounter for immunization: Secondary | ICD-10-CM | POA: Diagnosis not present

## 2020-10-01 DIAGNOSIS — Z992 Dependence on renal dialysis: Secondary | ICD-10-CM | POA: Diagnosis not present

## 2020-10-01 DIAGNOSIS — N186 End stage renal disease: Secondary | ICD-10-CM | POA: Diagnosis not present

## 2020-10-01 DIAGNOSIS — E119 Type 2 diabetes mellitus without complications: Secondary | ICD-10-CM | POA: Diagnosis not present

## 2020-10-02 ENCOUNTER — Other Ambulatory Visit: Payer: Self-pay

## 2020-10-02 ENCOUNTER — Ambulatory Visit (INDEPENDENT_AMBULATORY_CARE_PROVIDER_SITE_OTHER): Payer: Medicare Other | Admitting: Physical Therapy

## 2020-10-02 ENCOUNTER — Encounter: Payer: Self-pay | Admitting: Physical Therapy

## 2020-10-02 DIAGNOSIS — R293 Abnormal posture: Secondary | ICD-10-CM | POA: Diagnosis not present

## 2020-10-02 DIAGNOSIS — M6281 Muscle weakness (generalized): Secondary | ICD-10-CM | POA: Diagnosis not present

## 2020-10-02 DIAGNOSIS — R2689 Other abnormalities of gait and mobility: Secondary | ICD-10-CM

## 2020-10-02 DIAGNOSIS — R2681 Unsteadiness on feet: Secondary | ICD-10-CM | POA: Diagnosis not present

## 2020-10-02 DIAGNOSIS — M25661 Stiffness of right knee, not elsewhere classified: Secondary | ICD-10-CM

## 2020-10-02 NOTE — Patient Instructions (Signed)
Do each exercise 1-2  times per day Do each exercise 5-10 repetitions Hold each exercise for 2 seconds to feel your location  AT [place].  Try to find this position when standing still for activities.   USE TAPE ON FLOOR TO MARK THE MIDLINE POSITION. You also should try to feel with your limb pressure in socket.  You are trying to feel with limb what you used to feel with the bottom of your foot.  1. Side to Side Shift: Moving your hips only (not shoulders): move weight onto your left leg, HOLD/FEEL.  Move back to equal weight on each leg, HOLD/FEEL. Move weight onto your right leg, HOLD/FEEL. Move back to equal weight on each leg, HOLD/FEEL. Repeat.  Start with both hands on sink, progress to right hand only, then no hands (hovering over sink and baby movements).  2. Front to Back Shift: Moving your hips only (not shoulders): move your weight forward onto your toes, HOLD/FEEL. Move your weight back to equal Flat Foot on both legs, HOLD/FEEL. Move your weight back onto your heels, HOLD/FEEL. Move your weight back to equal on both legs, HOLD/FEEL. Repeat.   tart with both hands on sink, progress to right hand only, then no hands. 3. Moving Cones / Cups: With equal weight on each leg: Hold on with one hand the first time, then progress to no hand supports. Move cups from one side of sink to the other. Place cups ~2" out of your reach, progress to 10" beyond reach.  Place one hand in middle of sink and reach with other hand. Do both arms.  Then hover one hand and move cups with other hand.  4. Overhead/Upward Reaching: alternated reaching up to top cabinets or ceiling if no cabinets present. Keep equal weight on each leg. Start with one hand support on counter while other hand reaches and progress to no hand support with reaching.  ace one hand in middle of sink and reach with other hand. Do both arms.  Then hover one hand and move cups  with other hand.  5.   Looking Over Shoulders: With equal weight on each leg: alternate turning to look over your shoulders with one hand support on counter as needed.  Start with head motions only to look in front of shoulder, then even with shoulder and progress to looking behind you. To look to side, move head /eyes, then shoulder on side looking pulls back, shift more weight to side looking and pull hip back. ace one hand in middle of sink and let go with other hand so your shoulder can pull back. Switch hands to look other way.   Then hover one hand and move cups with other hand.

## 2020-10-02 NOTE — Therapy (Signed)
[facility] Physical Therapy [address] [place], [place], [address] Phone: [phone]   Fax:  [phone]  Physical Therapy Treatment  Patient Details  Name: Timothy Faulkner MRN: [id number] Date of Birth: Mar 06, 1946 Referring Provider (Timothy Faulkner): Monica Martinez, MD   Encounter Date: 10/02/2020   Timothy Faulkner End of Session - 10/02/20 1438    Visit Number 2    Number of Visits 25    Date for Timothy Faulkner Re-Evaluation 12/24/20    Authorization Type [employer] Medicare    Timothy Faulkner Start Time 3532    Timothy Faulkner Stop Time 1518    Timothy Faulkner Time Calculation (min) 46 min    Equipment Utilized During Treatment Gait belt    Activity Tolerance Patient tolerated treatment well;Patient limited by fatigue    Behavior During Therapy [facility] for tasks assessed/performed           Past Medical History:  Diagnosis Date  . Anemia   . Arthritis    Gout  . Automatic implantable cardioverter-defibrillator in situ    [employer]  . CHF   . ESRD on dialysis [facility])    Timothy Faulkner 03/11/2013 (03/11/2013) dialysis M/W/F  . Gangrene ([place])    left foot  . GERD (gastroesophageal reflux disease)   . Gout    "once a year"  . Heart murmur   . HYPERCHOLESTEROLEMIA, MIXED   . HYPERTENSION   . Macular degeneration   . Osteomyelitis ([place])    right foot  . 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 Pacemaker   . Peripheral arterial disease (HCC)    left fifth toe ulcer, healing  . Pneumonia   . Shortness of breath   . 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 Wears dentures   . Wears glasses     Past Surgical History:  Procedure Laterality Date  . A/V FISTULAGRAM Left 07/20/2018   Procedure: A/V FISTULAGRAM;  Surgeon: [name], MD;  Location: [place] [facility];  Service: Cardiovascular;  Laterality: Left;  . A/V FISTULAGRAM N/A 07/15/2019   Procedure: A/V FISTULAGRAM - Left Arm;  Surgeon: Elam Dutch, MD;  Location: [place] [facility];  Service: Cardiovascular;   Laterality: N/A;  . ABDOMINAL AORTOGRAM N/A 02/25/2019   Procedure: ABDOMINAL AORTOGRAM;  Surgeon: Elam Dutch, MD;  Location: [place] [facility];  Service: Cardiovascular;  Laterality: N/A;  . ABDOMINAL AORTOGRAM W/LOWER EXTREMITY Bilateral 07/20/2018   Procedure: ABDOMINAL AORTOGRAM W/LOWER EXTREMITY;  Surgeon: [name], MD;  Location: [place] [facility];  Service: Cardiovascular;  Laterality: Bilateral;  . AMPUTATION Right 09/07/2018   Procedure: Right fifth metatarsectomy;  Surgeon: [name], DPM;  Location: [place];  Service: Podiatry;  Laterality: Right;  . AMPUTATION Left 03/08/2019   Procedure: AMPUTATION  3RD AND 4TH TOES LEFT FOOT;  Surgeon: [name], DPM;  Location: [place];  Service: Podiatry;  Laterality: Left;  . AMPUTATION Left 03/15/2019   Procedure: AMPUTATION BELOW KNEE;  Surgeon: Elam Dutch, MD;  Location: [facility] OR;  Service: Vascular;  Laterality: Left;  . AV FISTULA PLACEMENT Right 12/13/2012   Procedure: ARTERIOVENOUS (AV) FISTULA CREATION;  Surgeon: [name], MD;  Location: [place];  Service: Vascular;  Laterality: Right;  Ultrasound guided  . AV FISTULA PLACEMENT Left 05/07/2016   Procedure: LEFT RADIOCEPHALIC ARTERIOVENOUS (AV) FISTULA CREATION;  Surgeon: [name], MD;  Location: [place];  Service: Vascular;  Laterality: Left;  . AV FISTULA PLACEMENT  Right 08/09/2019   Procedure: INSERTION OF RIGHT ARTERIOVENOUS (AV) 4-57mm GORE-TEX STRETCH GRAFT RIGHT  ARM;  Surgeon: Elam Dutch, MD;  Location: [place];  Service: Vascular;  Laterality: Right;  . BASCILIC VEIN TRANSPOSITION Right 03/26/2016   Procedure: RIGHT BASILIC VEIN TRANSPOSITION;  Surgeon: [name], MD;  Location: [place];  Service: Vascular;  Laterality: Right;  . CARDIAC CATHETERIZATION    . CARDIAC DEFIBRILLATOR PLACEMENT     Chemical engineer  . CATARACT EXTRACTION W/PHACO Bilateral   . EYE SURGERY Bilateral    Cataract  . FISTULOGRAM Left 04/22/2018   Procedure: FISTULOGRAM  UPPER EXTREMITY;  Surgeon: [name], MD;  Location: [place];  Service: Vascular;  Laterality: Left;  . I & D EXTREMITY Right 07/15/2018   Procedure: IRRIGATION AND DEBRIDEMENT RIGHT FOOT;  Surgeon: [name], DPM;  Location: [place];  Service: Podiatry;  Laterality: Right;  . INCISION AND DRAINAGE ABSCESS / HEMATOMA OF BURSA / KNEE / THIGH Left 2012   "knee" (03/11/2013)  . INSERT / REPLACE / REMOVE PACEMAKER    . INSERTION OF DIALYSIS CATHETER Left 04/22/2018   Procedure: INSERTION OF DIALYSIS CATHETER;  Surgeon: [name], MD;  Location: [place];  Service: Vascular;  Laterality: Left;  . IR FLUORO GUIDE CV LINE LEFT  07/15/2018  . IR PTA VENOUS EXCEPT DIALYSIS CIRCUIT  07/15/2018  . LOWER EXTREMITY ANGIOGRAPHY Right 07/21/2018   Procedure: LOWER EXTREMITY ANGIOGRAPHY;  Surgeon: [name], MD;  Location: [place] [facility];  Service: Cardiovascular;  Laterality: Right;  . LOWER EXTREMITY ANGIOGRAPHY Bilateral 02/25/2019   Procedure: Lower Extremity Angiography;  Surgeon: Elam Dutch, MD;  Location: [place] [facility];  Service: Cardiovascular;  Laterality: Bilateral;  . METATARSAL HEAD EXCISION Right 07/15/2018   Procedure: METATARSAL RESECTION;  Surgeon: [name], DPM;  Location: [place];  Service: Podiatry;  Laterality: Right;  . MULTIPLE TOOTH EXTRACTIONS    . PERIPHERAL VASCULAR ATHERECTOMY Right 02/28/2019   Procedure: PERIPHERAL VASCULAR ATHERECTOMY;  Surgeon: Waynetta Sandy, MD;  Location: [place] [facility];  Service: Cardiovascular;  Laterality: Right;  right tp trunk   . PERIPHERAL VASCULAR BALLOON ANGIOPLASTY Left 02/25/2019   Procedure: PERIPHERAL VASCULAR BALLOON ANGIOPLASTY;  Surgeon: Elam Dutch, MD;  Location: [place] [facility];  Service: Cardiovascular;  Laterality: Left;  tibial peroneal trunk and Timothy Faulkner  . PERIPHERAL VASCULAR INTERVENTION Right 07/21/2018   Procedure: PERIPHERAL VASCULAR INTERVENTION;  Surgeon: [name], MD;  Location: [place] [facility];  Service: Cardiovascular;  Laterality: Right;  peroneal stents  . REVISON OF ARTERIOVENOUS FISTULA Right 1/75/1025   Procedure: Plication OF Right Arm ARTERIOVENOUS FISTULA;  Surgeon: Elam Dutch, MD;  Location: [facility] OR;  Service: Vascular;  Laterality: Right;  . REVISON OF ARTERIOVENOUS FISTULA Left 04/22/2018   Procedure: REVISION OF RADIOCEPHALIC ARTERIOVENOUS FISTULA;  Surgeon: [name], MD;  Location: [place];  Service: Vascular;  Laterality: Left;  . SHUNTOGRAM N/A 05/31/2013   Procedure: [place] Mal[place];  Surgeon: [name], MD;  Location: [facility];  Service: Cardiovascular;  Laterality: N/A;  . UPPER EXTREMITY VENOGRAPHY Right 07/23/2018   Procedure: UPPER EXTREMITY VENOGRAPHY;  Surgeon: [name], MD;  Location: [place] [facility];  Service: Cardiovascular;  Laterality: Right;  . UPPER EXTREMITY VENOGRAPHY  07/15/2019   Procedure: UPPER EXTREMITY VENOGRAPHY;  Surgeon: Elam Dutch, MD;  Location: [place] [facility];  Service: Cardiovascular;;  rt arm   .  WOUND DEBRIDEMENT Right 07/17/2018   Procedure: Wound Debridement; Closure Filleted toe flap Right Foot;  Surgeon: [name], DPM;  Location: [place];  Service: Podiatry;  Laterality: Right;  . WOUND DEBRIDEMENT Right 09/07/2018   Procedure: Debridement Right Foot Wound, application of wound vac;  Surgeon: [name], DPM;  Location: [place];  Service: Podiatry;  Laterality: Right;  . WOUND DEBRIDEMENT Right 03/08/2019   Procedure: DEBRIDEMENT WOUND RIGHT FOOT;  Surgeon: [name], DPM;  Location: [place];  Service: Podiatry;  Laterality: Right;    There were no vitals filed for this visit.   Subjective Assessment - 10/02/20 1432    Subjective He wore prosthesis on nondialysis days 2hrs 1x/day. No pain or discomfort with wear.    Pertinent History PAD, DM2, arthritis, gout, CHF, implanted cardioverter-defib, HTN, CKD w/ ESRD    Patient  Stated Goals to use prosthesis to walk in community,    Currently in Pain? No/denies                             Timothy Faulkner Adult Timothy Faulkner Treatment/Exercise - 10/02/20 1432      Transfers   Transfers Sit to Stand;Stand to Sit;Squat Pivot Transfers    Sit to Stand 5: Supervision;With upper extremity assist;With armrests;From chair/3-in-1;Other (comment)   sink or external support to stabilize   Stand to Sit 5: Supervision;With upper extremity assist;With armrests;To chair/3-in-1;Other (comment)   from sink or external support to stabilize   Squat Pivot Transfers 5: Supervision;With upper extremity assistance;With armrests   w/c to car   Squat Pivot Transfer Details (indicate cue type and reason) verbal cues on technique & engaging prosthesis      Ambulation/Gait   Ambulation/Gait --      Neuro Re-ed    Neuro Re-ed Details  see Timothy Faulkner instructions for HEP at sink including cues for proprioception with socket on RLE.      Prosthetics   Prosthetic Care Comments  goal to wear most of awake hours to enable function and wearing 2x/day enables longer total time without irritating limb with heat of system. Patient to start donning and family to assist as needed.    Current prosthetic wear tolerance (days/week)  4 (nondialysis days) of last 7 days    Current prosthetic wear tolerance (#hours/day)  2hrs 1x/day   Timothy Faulkner recommended 3 hrs 2x/day on nondialysis days and up to 3hrs if out of bed after dialysis.    Current prosthetic weight-bearing tolerance (hours/day)  Patient tolerated 5 min standing multiple reps  without c/o limb pain or discomfort    Edema pitting edema    Residual limb condition  No open areas, dry skin, normal temperature, normal color, cylinerical shape    Education Provided Skin check;Residual limb care;Proper Donning;Proper wear schedule/adjustment    Person(s) Educated Patient;Child(ren)    Education Method Explanation;Demonstration;Verbal cues    Education Method  Verbalized understanding;Tactile cues required;Verbal cues required;Needs further instruction    Donning Prosthesis Moderate assist                  Timothy Faulkner Education - 10/02/20 1534    Education Details HEP at sink See patient instructions    Person(s) Educated Patient;Child(ren)    Methods Explanation;Demonstration;Tactile cues;Verbal cues;Handout    Comprehension Verbalized understanding;Returned demonstration;Verbal cues required;Tactile cues required;Need further instruction            Timothy Faulkner Short Term Goals - 09/25/20 2128  Timothy Faulkner SHORT TERM GOAL #1   Title Patient donnes prosthesis with verbal cues only.    Time 4    Period Weeks    Status New    Target Date 10/25/20      Timothy Faulkner SHORT TERM GOAL #2   Title Patient tolerates prosthesis wear >8hrs total on nondialysis days.    Time 4    Period Weeks    Status New    Target Date 10/25/20      Timothy Faulkner SHORT TERM GOAL #3   Title Patient standing balance with RW & prosthesis reaching 7" anteriorly & within 10" of floor with supervision.    Time 4    Period Weeks    Status New    Target Date 10/25/20      Timothy Faulkner SHORT TERM GOAL #4   Title Patient ambulates 70" with RW & prosthesis with minA.    Time 4    Period Weeks    Status New    Target Date 10/25/20             Timothy Faulkner Long Term Goals - 09/25/20 2123      Timothy Faulkner LONG TERM GOAL #1   Title Patient & family verbalize & demonstrate understanding of proper prosthetic care to enable utilization of prosthesis.    Time 12    Period Weeks    Status New    Target Date 12/20/20      Timothy Faulkner LONG TERM GOAL #2   Title Patient tolerates wear of prosthesis >90% of awake hours without skin or limb discomfort issues.    Time 12    Period Weeks    Status New    Target Date 12/20/20      Timothy Faulkner LONG TERM GOAL #3   Title [place] Balance >/= 36/56    Time 12    Period Weeks    Status New    Target Date 12/20/20      Timothy Faulkner LONG TERM GOAL #4   Title Patient ambulates 200' with RW & prosthesis  modified independent.    Time 12    Period Weeks    Status New    Target Date 12/20/20      Timothy Faulkner LONG TERM GOAL #5   Title Patient negotiates ramps & curbs with RW & prosthesis with minA.    Time 12    Period Weeks    Status New    Target Date 12/20/20                 Plan - 10/02/20 1439    Clinical Impression Statement Timothy Faulkner instructed in HEP at sink to improve standing balance, standing endurance, proprioception with socket - limb interface and functional strength. He appears to understand HEP but needs frequent rests.   Timothy Faulkner instructed in car transfers with prosthesis which also appears to understand.    Personal Factors and Comorbidities Comorbidity 3+;Fitness;Time since onset of injury/illness/exacerbation    Comorbidities PAD, DM2, arthritis, gout, CHF, implanted cardioverter-defib, HTN, CKD w/ ESRD, blind left knee    Examination-Activity Limitations Locomotion Level;Stairs;Stand;Transfers    Examination-Participation Restrictions Community Activity    Stability/Clinical Decision Making Evolving/Moderate complexity    Rehab Potential Good    Timothy Faulkner Frequency 2x / week    Timothy Faulkner Duration 12 weeks    Timothy Faulkner Treatment/Interventions ADLs/Self Care Home Management;DME Instruction;Gait training;Stair training;Functional mobility training;Therapeutic activities;Therapeutic exercise;Balance training;Neuromuscular re-education;Patient/family education;Prosthetic Training;Manual techniques;Vestibular    Timothy Faulkner Next Visit Plan review prosthetic care,  prosthetic gait with RW including ramps &  curbs,  exercise for strength, balance, flexibility & endurance.    Consulted and Agree with Plan of Care Patient;Family member/caregiver    Family Member Consulted daughter,Timothy Faulkner           Patient will benefit from skilled therapeutic intervention in order to improve the following deficits and impairments:  Abnormal gait,Cardiopulmonary status limiting activity,Decreased activity tolerance,Decreased  balance,Decreased endurance,Decreased knowledge of use of DME,Decreased mobility,Decreased range of motion,Decreased scar mobility,Decreased strength,Increased edema,Postural dysfunction,Prosthetic Dependency  Visit Diagnosis: Unsteadiness on feet  Other abnormalities of gait and mobility  Muscle weakness (generalized)  Stiffness of right knee, not elsewhere classified  Abnormal posture     Problem List Patient Active Problem List   Diagnosis Date Noted  . Polycythemia, secondary 02/21/2020  . Severe nonproliferative diabetic retinopathy of right eye without macular edema associated with type 2 diabetes mellitus ([place]) 11/29/2019  . Severe nonproliferative diabetic retinopathy of left eye without macular edema associated with type 2 diabetes mellitus ([place]) 11/29/2019  . Advanced nonexudative age-related macular degeneration of both eyes with subfoveal involvement 11/29/2019  . 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  . Sepsis ([place]) 04/14/2019  . Hypocalcemia 04/08/2019  . Drug induced constipation   . Anemia of chronic disease   . ESRD on dialysis ([place])   . Postoperative pain   . Phantom limb pain ([place])   . S/P BKA (below knee amputation), left ([place]) 03/18/2019  . Unilateral complete BKA, left, initial encounter ([place]) 03/17/2019  . Critical lower limb ischemia ([place]) 03/12/2019  . Infection of amputation stump, right lower extremity ([place]) 03/10/2019  . Hypotension 02/23/2019  . Cellulitis of left foot 02/23/2019  . Gangrene of toe of left foot ([place]) 02/22/2019  . Complication of vascular dialysis catheter 11/24/2018  . Chronic osteomyelitis involving right ankle and foot ([place])   . Ulcer of right foot with fat layer exposed ([place]) 09/02/2018  . Encounter for planned post-operative wound closure   . Acute osteomyelitis of metatarsal bone of right foot ([place])   . Diabetic ulcer of midfoot associated with diabetes mellitus due to underlying condition, with necrosis of  bone ([place])   . Osteomyelitis of right foot ([place])   . Vascular calcification   . Cellulitis and abscess of foot, except toes   . Anemia associated with chronic renal failure 07/13/2018  . Cardiomyopathy, dilated, nonischemic ([place]) 07/13/2018  . Diabetic foot infection ([place]) 07/13/2018  . PAD (peripheral artery disease) ([place])   . Macular degeneration   . Ventricular tachycardia ([place]) 06/24/2017  . Embolism due to vascular prosthetic devices, implants and grafts, initial encounter ([place]) 02/04/2016  . Dependence on renal dialysis ([place]) 05/30/2015  . Shortness of breath 02/05/2015  . Chronic systolic heart failure ([place]) 08/22/2014  . Diabetic infection of right foot ([place]) 07/13/2014  . Type 2 diabetes mellitus with diabetic peripheral angiopathy without gangrene ([place]) 03/18/2014  . Anxiety disorder, unspecified 02/23/2014  . Fever, unspecified 02/23/2014  . Headache 02/23/2014  . Pain, unspecified 02/23/2014  . Pruritus, unspecified 02/23/2014  . Mild protein-calorie malnutrition ([place]) 12/26/2013  . ESRD (end stage renal disease) on dialysis ([place]) 11/11/2013  . Snoring 11/11/2013  . Unspecified sleep apnea 11/11/2013  . Other pancytopenia ([place]) 09/22/2013  . Hemoptysis 09/20/2013  . Personal history of nicotine dependence 07/12/2013  . Coagulation defect, unspecified ([place]) 05/16/2013  . Iron deficiency anemia, unspecified 05/06/2013  . Other disorders of plasma-protein metabolism, not elsewhere classified 04/15/2013  . 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 Other cardiomyopathies ([place]) 03/18/2013  . Secondary hyperparathyroidism of renal origin ([place]) 03/18/2013  . Type 2 diabetes mellitus without complications ([place]) 94/99/7182  . Pre-transplant evaluation for kidney transplant 01/11/2013  . Automatic implantable cardioverter-defibrillator in situ 10/01/2010  . HYPERCHOLESTEROLEMIA, MIXED 05/03/2010  .  Essential hypertension 05/03/2010  . CHF 05/03/2010    Timothy Faulkner, Timothy Faulkner, Timothy Faulkner 10/02/2020, 3:38 PM  [facility] Physical [facility] [address] [place], [place], [address] Phone: [phone]   Fax:  [phone]  Name: Timothy Faulkner MRN: [id number] Date of Birth: [birth year]

## 2020-10-03 DIAGNOSIS — D689 Coagulation defect, unspecified: Secondary | ICD-10-CM | POA: Diagnosis not present

## 2020-10-03 DIAGNOSIS — N186 End stage renal disease: Secondary | ICD-10-CM | POA: Diagnosis not present

## 2020-10-03 DIAGNOSIS — N2581 Secondary hyperparathyroidism of renal origin: Secondary | ICD-10-CM | POA: Diagnosis not present

## 2020-10-03 DIAGNOSIS — Z23 Encounter for immunization: Secondary | ICD-10-CM | POA: Diagnosis not present

## 2020-10-03 DIAGNOSIS — E119 Type 2 diabetes mellitus without complications: Secondary | ICD-10-CM | POA: Diagnosis not present

## 2020-10-03 DIAGNOSIS — Z992 Dependence on renal dialysis: Secondary | ICD-10-CM | POA: Diagnosis not present

## 2020-10-04 ENCOUNTER — Ambulatory Visit (INDEPENDENT_AMBULATORY_CARE_PROVIDER_SITE_OTHER): Payer: Medicare Other | Admitting: Physical Therapy

## 2020-10-04 ENCOUNTER — Encounter: Payer: Self-pay | Admitting: Physical Therapy

## 2020-10-04 ENCOUNTER — Other Ambulatory Visit: Payer: Self-pay

## 2020-10-04 DIAGNOSIS — M6281 Muscle weakness (generalized): Secondary | ICD-10-CM | POA: Diagnosis not present

## 2020-10-04 DIAGNOSIS — R293 Abnormal posture: Secondary | ICD-10-CM | POA: Diagnosis not present

## 2020-10-04 DIAGNOSIS — R2689 Other abnormalities of gait and mobility: Secondary | ICD-10-CM | POA: Diagnosis not present

## 2020-10-04 DIAGNOSIS — R2681 Unsteadiness on feet: Secondary | ICD-10-CM

## 2020-10-04 DIAGNOSIS — M25661 Stiffness of right knee, not elsewhere classified: Secondary | ICD-10-CM | POA: Diagnosis not present

## 2020-10-04 NOTE — Therapy (Signed)
[facility] Physical Therapy [address] [place], [place], [address] Phone: [phone]   Fax:  [phone]  Physical Therapy Treatment  Patient Details  Name: Timothy Faulkner MRN: [id number] Date of Birth: 20-Jul-1946 Referring Provider (PT): Monica Martinez, MD   Encounter Date: 10/04/2020   PT End of Session - 10/04/20 0941    Visit Number 3    Number of Visits 25    Date for PT Re-Evaluation 12/24/20    Authorization Type UHC Medicare    PT Start Time 0937    PT Stop Time 1015    PT Time Calculation (min) 38 min    Equipment Utilized During Treatment Gait belt    Activity Tolerance Patient tolerated treatment well;Patient limited by fatigue    Behavior During Therapy [facility] for tasks assessed/performed           Past Medical History:  Diagnosis Date  . Anemia   . Arthritis    Gout  . Automatic implantable cardioverter-defibrillator in situ    [employer]  . CHF   . ESRD on dialysis [facility])    [name] 03/11/2013 (03/11/2013) dialysis M/W/F  . Gangrene ([place])    left foot  . GERD (gastroesophageal reflux disease)   . Gout    "once a year"  . Heart murmur   . HYPERCHOLESTEROLEMIA, MIXED   . HYPERTENSION   . Macular degeneration   . Osteomyelitis ([place])    right foot  . 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 Pacemaker   . Peripheral arterial disease (HCC)    left fifth toe ulcer, healing  . Pneumonia   . Shortness of breath   . 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 Wears dentures   . Wears glasses     Past Surgical History:  Procedure Laterality Date  . A/V FISTULAGRAM Left 07/20/2018   Procedure: A/V FISTULAGRAM;  Surgeon: [name], MD;  Location: [place] CV LAB;  Service: Cardiovascular;  Laterality: Left;  . A/V FISTULAGRAM N/A 07/15/2019   Procedure: A/V FISTULAGRAM - Left Arm;  Surgeon: Elam Dutch, MD;  Location: [place] CV LAB;  Service: Cardiovascular;   Laterality: N/A;  . ABDOMINAL AORTOGRAM N/A 02/25/2019   Procedure: ABDOMINAL AORTOGRAM;  Surgeon: Elam Dutch, MD;  Location: [place] CV LAB;  Service: Cardiovascular;  Laterality: N/A;  . ABDOMINAL AORTOGRAM W/LOWER EXTREMITY Bilateral 07/20/2018   Procedure: ABDOMINAL AORTOGRAM W/LOWER EXTREMITY;  Surgeon: [name], MD;  Location: [place] CV LAB;  Service: Cardiovascular;  Laterality: Bilateral;  . AMPUTATION Right 09/07/2018   Procedure: Right fifth metatarsectomy;  Surgeon: [name], DPM;  Location: [place];  Service: Podiatry;  Laterality: Right;  . AMPUTATION Left 03/08/2019   Procedure: AMPUTATION  3RD AND 4TH TOES LEFT FOOT;  Surgeon: [name], DPM;  Location: [place];  Service: Podiatry;  Laterality: Left;  . AMPUTATION Left 03/15/2019   Procedure: AMPUTATION BELOW KNEE;  Surgeon: Elam Dutch, MD;  Location: [place] OR;  Service: Vascular;  Laterality: Left;  . AV FISTULA PLACEMENT Right 12/13/2012   Procedure: ARTERIOVENOUS (AV) FISTULA CREATION;  Surgeon: [name], MD;  Location: [place];  Service: Vascular;  Laterality: Right;  Ultrasound guided  . AV FISTULA PLACEMENT Left 05/07/2016   Procedure: LEFT RADIOCEPHALIC ARTERIOVENOUS (AV) FISTULA CREATION;  Surgeon: [name], MD;  Location: [place];  Service: Vascular;  Laterality: Left;  . AV FISTULA PLACEMENT  Right 08/09/2019   Procedure: INSERTION OF RIGHT ARTERIOVENOUS (AV) 4-1mm GORE-TEX STRETCH GRAFT RIGHT  ARM;  Surgeon: Elam Dutch, MD;  Location: [place];  Service: Vascular;  Laterality: Right;  . BASCILIC VEIN TRANSPOSITION Right 03/26/2016   Procedure: RIGHT BASILIC VEIN TRANSPOSITION;  Surgeon: [name], MD;  Location: [place];  Service: Vascular;  Laterality: Right;  . CARDIAC CATHETERIZATION    . CARDIAC DEFIBRILLATOR PLACEMENT     Chemical engineer  . CATARACT EXTRACTION W/PHACO Bilateral   . EYE SURGERY Bilateral    Cataract  . FISTULOGRAM Left 04/22/2018   Procedure: FISTULOGRAM  UPPER EXTREMITY;  Surgeon: [name], MD;  Location: [place];  Service: Vascular;  Laterality: Left;  . I & D EXTREMITY Right 07/15/2018   Procedure: IRRIGATION AND DEBRIDEMENT RIGHT FOOT;  Surgeon: [name], DPM;  Location: [place];  Service: Podiatry;  Laterality: Right;  . INCISION AND DRAINAGE ABSCESS / HEMATOMA OF BURSA / KNEE / THIGH Left 2012   "knee" (03/11/2013)  . INSERT / REPLACE / REMOVE PACEMAKER    . INSERTION OF DIALYSIS CATHETER Left 04/22/2018   Procedure: INSERTION OF DIALYSIS CATHETER;  Surgeon: [name], MD;  Location: [place];  Service: Vascular;  Laterality: Left;  . IR FLUORO GUIDE CV LINE LEFT  07/15/2018  . IR PTA VENOUS EXCEPT DIALYSIS CIRCUIT  07/15/2018  . LOWER EXTREMITY ANGIOGRAPHY Right 07/21/2018   Procedure: LOWER EXTREMITY ANGIOGRAPHY;  Surgeon: [name], MD;  Location: [place] CV LAB;  Service: Cardiovascular;  Laterality: Right;  . LOWER EXTREMITY ANGIOGRAPHY Bilateral 02/25/2019   Procedure: Lower Extremity Angiography;  Surgeon: Elam Dutch, MD;  Location: [place] CV LAB;  Service: Cardiovascular;  Laterality: Bilateral;  . METATARSAL HEAD EXCISION Right 07/15/2018   Procedure: METATARSAL RESECTION;  Surgeon: [name], DPM;  Location: [place];  Service: Podiatry;  Laterality: Right;  . MULTIPLE TOOTH EXTRACTIONS    . PERIPHERAL VASCULAR ATHERECTOMY Right 02/28/2019   Procedure: PERIPHERAL VASCULAR ATHERECTOMY;  Surgeon: Waynetta Sandy, MD;  Location: [place] CV LAB;  Service: Cardiovascular;  Laterality: Right;  right tp trunk   . PERIPHERAL VASCULAR BALLOON ANGIOPLASTY Left 02/25/2019   Procedure: PERIPHERAL VASCULAR BALLOON ANGIOPLASTY;  Surgeon: Elam Dutch, MD;  Location: [place] CV LAB;  Service: Cardiovascular;  Laterality: Left;  tibial peroneal trunk and PT  . PERIPHERAL VASCULAR INTERVENTION Right 07/21/2018   Procedure: PERIPHERAL VASCULAR INTERVENTION;  Surgeon: [name], MD;  Location: [place] CV LAB;  Service: Cardiovascular;  Laterality: Right;  peroneal stents  . REVISON OF ARTERIOVENOUS FISTULA Right 0/98/1191   Procedure: Plication OF Right Arm ARTERIOVENOUS FISTULA;  Surgeon: Elam Dutch, MD;  Location: [facility] OR;  Service: Vascular;  Laterality: Right;  . REVISON OF ARTERIOVENOUS FISTULA Left 04/22/2018   Procedure: REVISION OF RADIOCEPHALIC ARTERIOVENOUS FISTULA;  Surgeon: [name], MD;  Location: [place];  Service: Vascular;  Laterality: Left;  . SHUNTOGRAM N/A 05/31/2013   Procedure: [place];  Surgeon: [name], MD;  Location: [facility] C[facility] LAB;  Service: Cardiovascular;  Laterality: N/A;  . UPPER EXTREMITY VENOGRAPHY Right 07/23/2018   Procedure: UPPER EXTREMITY VENOGRAPHY;  Surgeon: [name], MD;  Location: [place] CV LAB;  Service: Cardiovascular;  Laterality: Right;  . UPPER EXTREMITY VENOGRAPHY  07/15/2019   Procedure: UPPER EXTREMITY VENOGRAPHY;  Surgeon: Elam Dutch, MD;  Location: [place] CV LAB;  Service: Cardiovascular;;  rt arm   .  WOUND DEBRIDEMENT Right 07/17/2018   Procedure: Wound Debridement; Closure Filleted toe flap Right Foot;  Surgeon: [name], DPM;  Location: [place];  Service: Podiatry;  Laterality: Right;  . WOUND DEBRIDEMENT Right 09/07/2018   Procedure: Debridement Right Foot Wound, application of wound vac;  Surgeon: [name], DPM;  Location: [place];  Service: Podiatry;  Laterality: Right;  . WOUND DEBRIDEMENT Right 03/08/2019   Procedure: DEBRIDEMENT WOUND RIGHT FOOT;  Surgeon: [name], DPM;  Location: [place];  Service: Podiatry;  Laterality: Right;    There were no vitals filed for this visit.   Subjective Assessment - 10/04/20 0941    Subjective He did not get to wear prosthesis for second time on Tuesday and only one time yesterday with dialysis.    Pertinent History PAD, DM2, arthritis, gout, CHF, implanted cardioverter-defib, HTN, CKD w/  ESRD    Patient Stated Goals to use prosthesis to walk in community,    Currently in Pain? No/denies                             [facility] Adult PT Treatment/Exercise - 10/04/20 0937      Transfers   Transfers Sit to Stand;Stand to Sit;Squat Pivot Transfers    Sit to Stand 5: Supervision;With upper extremity assist;With armrests;From chair/3-in-1;Other (comment)   sink or external support to stabilize   Sit to Stand Details Verbal cues for sequencing;Verbal cues for technique;Visual cues/gestures for sequencing    Stand to Sit 5: Supervision;With upper extremity assist;With armrests;To chair/3-in-1;Other (comment)   from sink or external support to stabilize   Stand to Sit Details (indicate cue type and reason) Visual cues/gestures for sequencing;Verbal cues for technique      Ambulation/Gait   Ambulation/Gait Yes    Ambulation/Gait Assistance 4: Min assist    Ambulation/Gait Assistance Details visual cues for step width, verbal & tactile cues on upright posture & weight shift over prosthesis in stance.    Ramp 2: Max assist   RW & prosthesis   Ramp Details (indicate cue type and reason) demo, verbal & tactile cues on technique    Curb 3: Mod assist   RW & prosthesis   Curb Details (indicate cue type and reason) demo, verbal & tactile cues on technique      Neuro Re-ed    Neuro Re-ed Details  sitting on 24" stool with feet supported without UE support (but //bars close for safety) - Instructed to sit on stool near table for some ADLs to work on upper body tolerance & balance.  5 reps ea with PT contact gaurd : forward lean with recovery, back lean with recovery and trunk rotation right/left      Prosthetics   Prosthetic Care Comments  goal to wear most of awake hours to enable function and wearing 2x/day enables longer total time without irritating limb with heat of system. Patient to start donning and family to assist as needed.    Current prosthetic wear tolerance  (days/week)  4 nondialysis days + 1 dialysis day of last 7 days    Current prosthetic wear tolerance (#hours/day)  3hrs 1x/day   PT recommended 3 hrs 2x/day on nondialysis days and up to 3hrs if out of bed after dialysis.    Current prosthetic weight-bearing tolerance (hours/day)  Patient tolerated 5 min standing multiple reps  without c/o limb pain or discomfort    Edema pitting edema    Residual  limb condition  No open areas, dry skin, normal temperature, normal color, cylinerical shape    Education Provided Skin check;Residual limb care;Proper Donning;Proper wear schedule/adjustment    Person(s) Educated Patient;Child(ren)    Education Method Explanation;Demonstration;Tactile cues;Verbal cues    Education Method Verbalized understanding;Tactile cues required;Verbal cues required;Needs further instruction    Donning Prosthesis Minimal assist                    PT Short Term Goals - 09/25/20 2128      PT SHORT TERM GOAL #1   Title Patient donnes prosthesis with verbal cues only.    Time 4    Period Weeks    Status New    Target Date 10/25/20      PT SHORT TERM GOAL #2   Title Patient tolerates prosthesis wear >8hrs total on nondialysis days.    Time 4    Period Weeks    Status New    Target Date 10/25/20      PT SHORT TERM GOAL #3   Title Patient standing balance with RW & prosthesis reaching 7" anteriorly & within 10" of floor with supervision.    Time 4    Period Weeks    Status New    Target Date 10/25/20      PT SHORT TERM GOAL #4   Title Patient ambulates 75" with RW & prosthesis with minA.    Time 4    Period Weeks    Status New    Target Date 10/25/20             PT Long Term Goals - 09/25/20 2123      PT LONG TERM GOAL #1   Title Patient & family verbalize & demonstrate understanding of proper prosthetic care to enable utilization of prosthesis.    Time 12    Period Weeks    Status New    Target Date 12/20/20      PT LONG TERM GOAL #2    Title Patient tolerates wear of prosthesis >90% of awake hours without skin or limb discomfort issues.    Time 12    Period Weeks    Status New    Target Date 12/20/20      PT LONG TERM GOAL #3   Title [place] Balance >/= 36/56    Time 12    Period Weeks    Status New    Target Date 12/20/20      PT LONG TERM GOAL #4   Title Patient ambulates 200' with RW & prosthesis modified independent.    Time 12    Period Weeks    Status New    Target Date 12/20/20      PT LONG TERM GOAL #5   Title Patient negotiates ramps & curbs with RW & prosthesis with minA.    Time 12    Period Weeks    Status New    Target Date 12/20/20                 Plan - 10/04/20 0941    Clinical Impression Statement PT instructed in negotiating ramps & curbs with RW & TTA prosthesis. He required assistance along with directions for safety.    Personal Factors and Comorbidities Comorbidity 3+;Fitness;Time since onset of injury/illness/exacerbation    Comorbidities PAD, DM2, arthritis, gout, CHF, implanted cardioverter-defib, HTN, CKD w/ ESRD, blind left knee    Examination-Activity Limitations Locomotion Level;Stairs;Stand;Transfers    Examination-Participation Restrictions Community Activity  Stability/Clinical Decision Making Evolving/Moderate complexity    Rehab Potential Good    PT Frequency 2x / week    PT Duration 12 weeks    PT Treatment/Interventions ADLs/Self Care Home Management;DME Instruction;Gait training;Stair training;Functional mobility training;Therapeutic activities;Therapeutic exercise;Balance training;Neuromuscular re-education;Patient/family education;Prosthetic Training;Manual techniques;Vestibular    PT Next Visit Plan review prosthetic care,  prosthetic gait with RW including ramps & curbs,  exercise for strength, balance, flexibility & endurance.    Consulted and Agree with Plan of Care Patient;Family member/caregiver    Family Member Consulted daughter,Sherry Cira Servant            Patient will benefit from skilled therapeutic intervention in order to improve the following deficits and impairments:  Abnormal gait,Cardiopulmonary status limiting activity,Decreased activity tolerance,Decreased balance,Decreased endurance,Decreased knowledge of use of DME,Decreased mobility,Decreased range of motion,Decreased scar mobility,Decreased strength,Increased edema,Postural dysfunction,Prosthetic Dependency  Visit Diagnosis: Unsteadiness on feet  Other abnormalities of gait and mobility  Muscle weakness (generalized)  Stiffness of right knee, not elsewhere classified  Abnormal posture     Problem List Patient Active Problem List   Diagnosis Date Noted  . Polycythemia, secondary 02/21/2020  . Severe nonproliferative diabetic retinopathy of right eye without macular edema associated with type 2 diabetes mellitus ([place]) 11/29/2019  . Severe nonproliferative diabetic retinopathy of left eye without macular edema associated with type 2 diabetes mellitus ([place]) 11/29/2019  . Advanced nonexudative age-related macular degeneration of both eyes with subfoveal involvement 11/29/2019  . 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  . Sepsis ([place]) 04/14/2019  . Hypocalcemia 04/08/2019  . Drug induced constipation   . Anemia of chronic disease   . ESRD on dialysis ([place])   . Postoperative pain   . Phantom limb pain ([place])   . S/P BKA (below knee amputation), left ([place]) 03/18/2019  . Unilateral complete BKA, left, initial encounter ([place]) 03/17/2019  . Critical lower limb ischemia ([place]) 03/12/2019  . Infection of amputation stump, right lower extremity ([place]) 03/10/2019  . Hypotension 02/23/2019  . Cellulitis of left foot 02/23/2019  . Gangrene of toe of left foot ([place]) 02/22/2019  . Complication of vascular dialysis catheter 11/24/2018  . Chronic osteomyelitis involving right ankle and foot ([place])   . Ulcer of right foot with fat layer exposed ([place]) 09/02/2018  .  Encounter for planned post-operative wound closure   . Acute osteomyelitis of metatarsal bone of right foot ([place])   . Diabetic ulcer of midfoot associated with diabetes mellitus due to underlying condition, with necrosis of bone ([place])   . Osteomyelitis of right foot ([place])   . Vascular calcification   . Cellulitis and abscess of foot, except toes   . Anemia associated with chronic renal failure 07/13/2018  . Cardiomyopathy, dilated, nonischemic ([place]) 07/13/2018  . Diabetic foot infection ([place]) 07/13/2018  . PAD (peripheral artery disease) ([place])   . Macular degeneration   . Ventricular tachycardia ([place]) 06/24/2017  . Embolism due to vascular prosthetic devices, implants and grafts, initial encounter ([place]) 02/04/2016  . Dependence on renal dialysis ([place]) 05/30/2015  . Shortness of breath 02/05/2015  . Chronic systolic heart failure ([place]) 08/22/2014  . Diabetic infection of right foot ([place]) 07/13/2014  . Type 2 diabetes mellitus with diabetic peripheral angiopathy without gangrene ([place]) 03/18/2014  . Anxiety disorder, unspecified 02/23/2014  . Fever, unspecified 02/23/2014  . Headache 02/23/2014  . Pain, unspecified 02/23/2014  . Pruritus, unspecified 02/23/2014  . Mild protein-calorie malnutrition ([place]) 12/26/2013  . ESRD (end stage renal disease) on dialysis ([place]) 11/11/2013  .  Snoring 11/11/2013  . Unspecified sleep apnea 11/11/2013  . Other pancytopenia ([place]) 09/22/2013  . Hemoptysis 09/20/2013  . Personal history of nicotine dependence 07/12/2013  . Coagulation defect, unspecified ([place]) 05/16/2013  . Iron deficiency anemia, unspecified 05/06/2013  . Other disorders of plasma-protein metabolism, not elsewhere classified 04/15/2013  . 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 chronic kidney disease or end stage renal disease ([place]) 03/18/2013  . Other cardiomyopathies ([place]) 03/18/2013  . Secondary hyperparathyroidism of renal origin ([place]) 03/18/2013  . Type 2 diabetes  mellitus without complications ([place]) 91/66/0600  . Pre-transplant evaluation for kidney transplant 01/11/2013  . Automatic implantable cardioverter-defibrillator in situ 10/01/2010  . HYPERCHOLESTEROLEMIA, MIXED 05/03/2010  . Essential hypertension 05/03/2010  . CHF 05/03/2010    Jamey Reas, PT, DPT 10/04/2020, 12:26 PM  [facility] Physical Therapy [address] [place], [place], [address] Phone: [phone]   Fax:  [phone]  Name: Timothy Faulkner MRN: [id number] Date of Birth: 04-02-1946

## 2020-10-05 DIAGNOSIS — N2581 Secondary hyperparathyroidism of renal origin: Secondary | ICD-10-CM | POA: Diagnosis not present

## 2020-10-05 DIAGNOSIS — Z992 Dependence on renal dialysis: Secondary | ICD-10-CM | POA: Diagnosis not present

## 2020-10-05 DIAGNOSIS — D689 Coagulation defect, unspecified: Secondary | ICD-10-CM | POA: Diagnosis not present

## 2020-10-05 DIAGNOSIS — E119 Type 2 diabetes mellitus without complications: Secondary | ICD-10-CM | POA: Diagnosis not present

## 2020-10-05 DIAGNOSIS — N186 End stage renal disease: Secondary | ICD-10-CM | POA: Diagnosis not present

## 2020-10-05 DIAGNOSIS — Z23 Encounter for immunization: Secondary | ICD-10-CM | POA: Diagnosis not present

## 2020-10-08 DIAGNOSIS — Z992 Dependence on renal dialysis: Secondary | ICD-10-CM | POA: Diagnosis not present

## 2020-10-08 DIAGNOSIS — D689 Coagulation defect, unspecified: Secondary | ICD-10-CM | POA: Diagnosis not present

## 2020-10-08 DIAGNOSIS — N186 End stage renal disease: Secondary | ICD-10-CM | POA: Diagnosis not present

## 2020-10-08 DIAGNOSIS — N2581 Secondary hyperparathyroidism of renal origin: Secondary | ICD-10-CM | POA: Diagnosis not present

## 2020-10-08 DIAGNOSIS — E119 Type 2 diabetes mellitus without complications: Secondary | ICD-10-CM | POA: Diagnosis not present

## 2020-10-08 DIAGNOSIS — Z23 Encounter for immunization: Secondary | ICD-10-CM | POA: Diagnosis not present

## 2020-10-08 DIAGNOSIS — I12 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 chronic kidney disease or end stage renal disease: Secondary | ICD-10-CM | POA: Diagnosis not present

## 2020-10-09 ENCOUNTER — Ambulatory Visit: Payer: Medicare Other | Admitting: Podiatry

## 2020-10-09 ENCOUNTER — Encounter: Payer: Self-pay | Admitting: Physical Therapy

## 2020-10-09 ENCOUNTER — Ambulatory Visit (INDEPENDENT_AMBULATORY_CARE_PROVIDER_SITE_OTHER): Payer: Medicare Other | Admitting: Physical Therapy

## 2020-10-09 DIAGNOSIS — R2689 Other abnormalities of gait and mobility: Secondary | ICD-10-CM | POA: Diagnosis not present

## 2020-10-09 DIAGNOSIS — R293 Abnormal posture: Secondary | ICD-10-CM | POA: Diagnosis not present

## 2020-10-09 DIAGNOSIS — M25661 Stiffness of right knee, not elsewhere classified: Secondary | ICD-10-CM | POA: Diagnosis not present

## 2020-10-09 DIAGNOSIS — M6281 Muscle weakness (generalized): Secondary | ICD-10-CM

## 2020-10-09 DIAGNOSIS — R2681 Unsteadiness on feet: Secondary | ICD-10-CM

## 2020-10-09 NOTE — Patient Instructions (Signed)
Do each exercise 1-2  times per day Do each exercise 10 repetitions Hold each exercise for 2 seconds to feel your location  AT [place].  Try to find this position when standing still for activities.   USE TAPE ON FLOOR TO MARK THE MIDLINE POSITION. You also should try to feel with your limb pressure in socket.  You are trying to feel with limb what you used to feel with the bottom of your foot.  1. Side to Side Shift: Moving your hips only (not shoulders): move weight onto your left leg, HOLD/FEEL.  Move back to equal weight on each leg, HOLD/FEEL. Move weight onto your right leg, HOLD/FEEL. Move back to equal weight on each leg, HOLD/FEEL. Repeat.  Start with both hands on sink, progress to left hand only, then no hands.  2. Front to Back Shift: Moving your hips only (not shoulders): move your weight forward onto your toes, HOLD/FEEL. Move your weight back to equal [place] Foot on both legs, HOLD/FEEL. Move your weight back onto your heels, HOLD/FEEL. Move your weight back to equal on both legs, HOLD/FEEL. Repeat.   start with both hands on sink, progress to left hand only, then no hands. 3. Moving Cones / Cups: With equal weight on each leg: Hold on with one hand the first time, then progress to no hand supports. Move cups from one side of sink to the other. Place cups ~2" out of your reach, progress to 10" beyond reach.  Place one hand in middle of sink and reach with other hand. Do both arms.  Then hover one hand and move cups with other hand.  4. Overhead/Upward Reaching: alternated reaching up to top cabinets or ceiling if no cabinets present. Keep equal weight on each leg. Start with one hand support on counter while other hand reaches and progress to no hand support with reaching.  ace one hand in middle of sink and reach with other hand. Do both arms.  Then hover one hand and move cups with other hand.  5.   Looking Over  Shoulders: With equal weight on each leg: alternate turning to look over your shoulders with one hand support on counter as needed.  Start with head motions only to look in front of shoulder, then even with shoulder and progress to looking behind you. To look to side, move head /eyes, then shoulder on side looking pulls back, shift more weight to side looking and pull hip back. ace one hand in middle of sink and let go with other hand so your shoulder can pull back. Switch hands to look other way.   Then hover one hand and move cups with other hand.

## 2020-10-09 NOTE — Therapy (Signed)
[facility] [address] [place], [place], [address] Phone: [phone]   Fax:  [phone]  Physical Therapy Treatment  Patient Details  Name: Timothy Faulkner MRN: [id number] Date of Birth: 04-22-1946 Referring Provider (PT): Monica Martinez, MD   Encounter Date: 10/09/2020   PT End of Session - 10/09/20 1615    Visit Number 4    Number of Visits 25    Date for PT Re-Evaluation 12/24/20    Authorization Type UHC Medicare    PT Start Time 0240    PT Stop Time 1517    PT Time Calculation (min) 39 min    Equipment Utilized During Treatment Gait belt    Activity Tolerance Patient tolerated treatment well;Patient limited by fatigue    Behavior During Therapy [facility] for tasks assessed/performed           Past Medical History:  Diagnosis Date  . Anemia   . Arthritis    Gout  . Automatic implantable cardioverter-defibrillator in situ    [employer]  . CHF   . ESRD on dialysis [facility])    [name] 03/11/2013 (03/11/2013) dialysis M/W/F  . Gangrene ([place])    left foot  . GERD (gastroesophageal reflux disease)   . Gout    "once a year"  . Heart murmur   . HYPERCHOLESTEROLEMIA, MIXED   . HYPERTENSION   . Macular degeneration   . Osteomyelitis ([place])    right foot  . 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 Pacemaker   . Peripheral arterial disease (HCC)    left fifth toe ulcer, healing  . Pneumonia   . Shortness of breath   . 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 Wears dentures   . Wears glasses     Past Surgical History:  Procedure Laterality Date  . A/V FISTULAGRAM Left 07/20/2018   Procedure: A/V FISTULAGRAM;  Surgeon: [name], MD;  Location: [place] [facility];  Service: Cardiovascular;  Laterality: Left;  . A/V FISTULAGRAM N/A 07/15/2019   Procedure: A/V FISTULAGRAM - Left Arm;  Surgeon: Elam Dutch, MD;  Location: [place] [facility];  Service: Cardiovascular;   Laterality: N/A;  . ABDOMINAL AORTOGRAM N/A 02/25/2019   Procedure: ABDOMINAL AORTOGRAM;  Surgeon: Elam Dutch, MD;  Location: [place] [facility];  Service: Cardiovascular;  Laterality: N/A;  . ABDOMINAL AORTOGRAM W/LOWER EXTREMITY Bilateral 07/20/2018   Procedure: ABDOMINAL AORTOGRAM W/LOWER EXTREMITY;  Surgeon: [name], MD;  Location: [place] [facility];  Service: Cardiovascular;  Laterality: Bilateral;  . AMPUTATION Right 09/07/2018   Procedure: Right fifth metatarsectomy;  Surgeon: [name], DPM;  Location: [place];  Service: Podiatry;  Laterality: Right;  . AMPUTATION Left 03/08/2019   Procedure: AMPUTATION  3RD AND 4TH TOES LEFT FOOT;  Surgeon: [name], DPM;  Location: [place];  Service: Podiatry;  Laterality: Left;  . AMPUTATION Left 03/15/2019   Procedure: AMPUTATION BELOW KNEE;  Surgeon: Elam Dutch, MD;  Location: [facility];  Service: Vascular;  Laterality: Left;  . AV FISTULA PLACEMENT Right 12/13/2012   Procedure: ARTERIOVENOUS (AV) FISTULA CREATION;  Surgeon: [name], MD;  Location: [place];  Service: Vascular;  Laterality: Right;  Ultrasound guided  . AV FISTULA PLACEMENT Left 05/07/2016   Procedure: LEFT RADIOCEPHALIC ARTERIOVENOUS (AV) FISTULA CREATION;  Surgeon: [name], MD;  Location: [place];  Service: Vascular;  Laterality: Left;  . AV FISTULA PLACEMENT  Right 08/09/2019   Procedure: INSERTION OF RIGHT ARTERIOVENOUS (AV) 4-69mm GORE-TEX STRETCH GRAFT RIGHT  ARM;  Surgeon: Elam Dutch, MD;  Location: [place];  Service: Vascular;  Laterality: Right;  . BASCILIC VEIN TRANSPOSITION Right 03/26/2016   Procedure: RIGHT BASILIC VEIN TRANSPOSITION;  Surgeon: [name], MD;  Location: [place];  Service: Vascular;  Laterality: Right;  . CARDIAC CATHETERIZATION    . CARDIAC DEFIBRILLATOR PLACEMENT     Chemical engineer  . CATARACT EXTRACTION W/PHACO Bilateral   . EYE SURGERY Bilateral    Cataract  . FISTULOGRAM Left 04/22/2018   Procedure: FISTULOGRAM  UPPER EXTREMITY;  Surgeon: [name], MD;  Location: [place];  Service: Vascular;  Laterality: Left;  . I & D EXTREMITY Right 07/15/2018   Procedure: IRRIGATION AND DEBRIDEMENT RIGHT FOOT;  Surgeon: [name], DPM;  Location: [place];  Service: Podiatry;  Laterality: Right;  . INCISION AND DRAINAGE ABSCESS / HEMATOMA OF BURSA / KNEE / THIGH Left 2012   "knee" (03/11/2013)  . INSERT / REPLACE / REMOVE PACEMAKER    . INSERTION OF DIALYSIS CATHETER Left 04/22/2018   Procedure: INSERTION OF DIALYSIS CATHETER;  Surgeon: [name], MD;  Location: [place];  Service: Vascular;  Laterality: Left;  . IR FLUORO GUIDE CV LINE LEFT  07/15/2018  . IR PTA VENOUS EXCEPT DIALYSIS CIRCUIT  07/15/2018  . LOWER EXTREMITY ANGIOGRAPHY Right 07/21/2018   Procedure: LOWER EXTREMITY ANGIOGRAPHY;  Surgeon: [name], MD;  Location: [place] [facility];  Service: Cardiovascular;  Laterality: Right;  . LOWER EXTREMITY ANGIOGRAPHY Bilateral 02/25/2019   Procedure: Lower Extremity Angiography;  Surgeon: Elam Dutch, MD;  Location: [place] [facility];  Service: Cardiovascular;  Laterality: Bilateral;  . METATARSAL HEAD EXCISION Right 07/15/2018   Procedure: METATARSAL RESECTION;  Surgeon: [name], DPM;  Location: [place];  Service: Podiatry;  Laterality: Right;  . MULTIPLE TOOTH EXTRACTIONS    . PERIPHERAL VASCULAR ATHERECTOMY Right 02/28/2019   Procedure: PERIPHERAL VASCULAR ATHERECTOMY;  Surgeon: Waynetta Sandy, MD;  Location: [place] [facility];  Service: Cardiovascular;  Laterality: Right;  right tp trunk   . PERIPHERAL VASCULAR BALLOON ANGIOPLASTY Left 02/25/2019   Procedure: PERIPHERAL VASCULAR BALLOON ANGIOPLASTY;  Surgeon: Elam Dutch, MD;  Location: [place] [facility];  Service: Cardiovascular;  Laterality: Left;  tibial peroneal trunk and PT  . PERIPHERAL VASCULAR INTERVENTION Right 07/21/2018   Procedure: PERIPHERAL VASCULAR INTERVENTION;  Surgeon: [name], MD;  Location: [place] [facility];  Service: Cardiovascular;  Laterality: Right;  peroneal stents  . REVISON OF ARTERIOVENOUS FISTULA Right 1/50/5697   Procedure: Plication OF Right Arm ARTERIOVENOUS FISTULA;  Surgeon: Elam Dutch, MD;  Location: [facility] OR;  Service: Vascular;  Laterality: Right;  . REVISON OF ARTERIOVENOUS FISTULA Left 04/22/2018   Procedure: REVISION OF RADIOCEPHALIC ARTERIOVENOUS FISTULA;  Surgeon: [name], MD;  Location: [place];  Service: Vascular;  Laterality: Left;  . SHUNTOGRAM N/A 05/31/2013   Procedure: Earney Mallet;  Surgeon: [name], MD;  Location: [place] [facility];  Service: Cardiovascular;  Laterality: N/A;  . UPPER EXTREMITY VENOGRAPHY Right 07/23/2018   Procedure: UPPER EXTREMITY VENOGRAPHY;  Surgeon: [name], MD;  Location: [place] [facility];  Service: Cardiovascular;  Laterality: Right;  . UPPER EXTREMITY VENOGRAPHY  07/15/2019   Procedure: UPPER EXTREMITY VENOGRAPHY;  Surgeon: Elam Dutch, MD;  Location: [place] [facility];  Service: Cardiovascular;;  rt arm   .  WOUND DEBRIDEMENT Right 07/17/2018   Procedure: Wound Debridement; Closure Filleted toe flap Right Foot;  Surgeon: [name], DPM;  Location: [place];  Service: Podiatry;  Laterality: Right;  . WOUND DEBRIDEMENT Right 09/07/2018   Procedure: Debridement Right Foot Wound, application of wound vac;  Surgeon: [name], DPM;  Location: [place];  Service: Podiatry;  Laterality: Right;  . WOUND DEBRIDEMENT Right 03/08/2019   Procedure: DEBRIDEMENT WOUND RIGHT FOOT;  Surgeon: [name], DPM;  Location: [place];  Service: Podiatry;  Laterality: Right;    There were no vitals filed for this visit.   Subjective Assessment - 10/09/20 1437    Subjective He wore prosthesis 2hrs 2x/day on nondialysis days.    Pertinent History PAD, DM2, arthritis, gout, CHF, implanted cardioverter-defib, HTN, CKD w/ ESRD    Patient Stated Goals to use prosthesis to walk  in community,    Currently in Pain? No/denies                             [facility] Adult PT Treatment/Exercise - 10/09/20 1440      Transfers   Transfers Sit to Stand;Stand to Sit;Squat Pivot Transfers    Sit to Stand 5: Supervision;With upper extremity assist;With armrests;From chair/3-in-1;Other (comment)   sink or external support to stabilize   Sit to Stand Details Verbal cues for sequencing;Verbal cues for technique;Visual cues/gestures for sequencing    Stand to Sit 5: Supervision;With upper extremity assist;With armrests;To chair/3-in-1;Other (comment)   from sink or external support to stabilize   Stand to Sit Details (indicate cue type and reason) Visual cues/gestures for sequencing;Verbal cues for technique      Ambulation/Gait   Ambulation/Gait Yes    Ambulation/Gait Assistance 4: Min assist    Ambulation/Gait Assistance Details visual & verbal cues on proper step width.    Ambulation Distance (Feet) 30 Feet   20' & 30'   Assistive device Rolling walker;Prosthesis    Ramp --    Curb 4: Min assist   RW & prosthesis   Curb Details (indicate cue type and reason) demo, verbal & tactile cues on technique      Neuro Re-ed    Neuro Re-ed Details  HEP at sink see pt instructions      Prosthetics   Prosthetic Care Comments  increase wear to 3hrs 2x/day.  Began instruction in adjusting ply socks.    Current prosthetic wear tolerance (days/week)  4 nondialysis days + 1 dialysis day of last 7 days    Current prosthetic wear tolerance (#hours/day)  2hrs 2x/day on nondialysis days    Current prosthetic weight-bearing tolerance (hours/day)  Patient tolerated 5 min standing multiple reps with limb pain ~5/10    Edema pitting edema    Residual limb condition  No open areas, dry skin, normal temperature, normal color, cylinerical shape    Education Provided Skin check;Residual limb care;Proper Donning;Proper wear schedule/adjustment;Correct ply sock adjustment    Person(s)  Educated Patient;Child(ren)    Education Method Verbal cues;Explanation;Demonstration;Tactile cues    Education Method Verbalized understanding;Tactile cues required;Verbal cues required;Needs further instruction    Donning Prosthesis Minimal assist                    PT Short Term Goals - 09/25/20 2128      PT SHORT TERM GOAL #1   Title Patient donnes prosthesis with verbal cues only.    Time 4  Period Weeks    Status New    Target Date 10/25/20      PT SHORT TERM GOAL #2   Title Patient tolerates prosthesis wear >8hrs total on nondialysis days.    Time 4    Period Weeks    Status New    Target Date 10/25/20      PT SHORT TERM GOAL #3   Title Patient standing balance with RW & prosthesis reaching 7" anteriorly & within 10" of floor with supervision.    Time 4    Period Weeks    Status New    Target Date 10/25/20      PT SHORT TERM GOAL #4   Title Patient ambulates 21" with RW & prosthesis with minA.    Time 4    Period Weeks    Status New    Target Date 10/25/20             PT Long Term Goals - 09/25/20 2123      PT LONG TERM GOAL #1   Title Patient & family verbalize & demonstrate understanding of proper prosthetic care to enable utilization of prosthesis.    Time 12    Period Weeks    Status New    Target Date 12/20/20      PT LONG TERM GOAL #2   Title Patient tolerates wear of prosthesis >90% of awake hours without skin or limb discomfort issues.    Time 12    Period Weeks    Status New    Target Date 12/20/20      PT LONG TERM GOAL #3   Title [place] Balance >/= 36/56    Time 12    Period Weeks    Status New    Target Date 12/20/20      PT LONG TERM GOAL #4   Title Patient ambulates 200' with RW & prosthesis modified independent.    Time 12    Period Weeks    Status New    Target Date 12/20/20      PT LONG TERM GOAL #5   Title Patient negotiates ramps & curbs with RW & prosthesis with minA.    Time 12    Period Weeks    Status  New    Target Date 12/20/20                 Plan - 10/09/20 1615    Clinical Impression Statement PT session worked on prosthetic gait with RW equal step width and introduced negotiating a curb. He will need further work before safely performing outside of PT.  PT also instructed in HEP to work on standing tolerance, standing balance & proprioception with socket.  He has a general understanding.    Personal Factors and Comorbidities Comorbidity 3+;Fitness;Time since onset of injury/illness/exacerbation    Comorbidities PAD, DM2, arthritis, gout, CHF, implanted cardioverter-defib, HTN, CKD w/ ESRD, blind left knee    Examination-Activity Limitations Locomotion Level;Stairs;Stand;Transfers    Examination-Participation Restrictions Community Activity    Stability/Clinical Decision Making Evolving/Moderate complexity    Rehab Potential Good    PT Frequency 2x / week    PT Duration 12 weeks    PT Treatment/Interventions ADLs/Self Care Home Management;DME Instruction;Gait training;Stair training;Functional mobility training;Therapeutic activities;Therapeutic exercise;Balance training;Neuromuscular re-education;Patient/family education;Prosthetic Training;Manual techniques;Vestibular    PT Next Visit Plan review prosthetic care,  prosthetic gait with RW including ramps & curbs,  exercise for strength, balance, flexibility & endurance.    Consulted and Agree with  Plan of Care Patient;Family member/caregiver    Family Member Consulted daughter,Sherry Cira Servant           Patient will benefit from skilled therapeutic intervention in order to improve the following deficits and impairments:  Abnormal gait,Cardiopulmonary status limiting activity,Decreased activity tolerance,Decreased balance,Decreased endurance,Decreased knowledge of use of DME,Decreased mobility,Decreased range of motion,Decreased scar mobility,Decreased strength,Increased edema,Postural dysfunction,Prosthetic Dependency  Visit  Diagnosis: Unsteadiness on feet  Other abnormalities of gait and mobility  Muscle weakness (generalized)  Stiffness of right knee, not elsewhere classified  Abnormal posture     Problem List Patient Active Problem List   Diagnosis Date Noted  . Polycythemia, secondary 02/21/2020  . Severe nonproliferative diabetic retinopathy of right eye without macular edema associated with type 2 diabetes mellitus ([place]) 11/29/2019  . Severe nonproliferative diabetic retinopathy of left eye without macular edema associated with type 2 diabetes mellitus ([place]) 11/29/2019  . Advanced nonexudative age-related macular degeneration of both eyes with subfoveal involvement 11/29/2019  . 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  . Sepsis ([place]) 04/14/2019  . Hypocalcemia 04/08/2019  . Drug induced constipation   . Anemia of chronic disease   . ESRD on dialysis ([place])   . Postoperative pain   . Phantom limb pain ([place])   . S/P BKA (below knee amputation), left ([place]) 03/18/2019  . Unilateral complete BKA, left, initial encounter ([place]) 03/17/2019  . Critical lower limb ischemia ([place]) 03/12/2019  . Infection of amputation stump, right lower extremity ([place]) 03/10/2019  . Hypotension 02/23/2019  . Cellulitis of left foot 02/23/2019  . Gangrene of toe of left foot ([place]) 02/22/2019  . Complication of vascular dialysis catheter 11/24/2018  . Chronic osteomyelitis involving right ankle and foot ([place])   . Ulcer of right foot with fat layer exposed ([place]) 09/02/2018  . Encounter for planned post-operative wound closure   . Acute osteomyelitis of metatarsal bone of right foot ([place])   . Diabetic ulcer of midfoot associated with diabetes mellitus due to underlying condition, with necrosis of bone ([place])   . Osteomyelitis of right foot ([place])   . Vascular calcification   . Cellulitis and abscess of foot, except toes   . Anemia associated with chronic renal failure 07/13/2018  . Cardiomyopathy, dilated,  nonischemic ([place]) 07/13/2018  . Diabetic foot infection ([place]) 07/13/2018  . PAD (peripheral artery disease) ([place])   . Macular degeneration   . Ventricular tachycardia ([place]) 06/24/2017  . Embolism due to vascular prosthetic devices, implants and grafts, initial encounter ([place]) 02/04/2016  . Dependence on renal dialysis ([place]) 05/30/2015  . Shortness of breath 02/05/2015  . Chronic systolic heart failure ([place]) 08/22/2014  . Diabetic infection of right foot ([place]) 07/13/2014  . Type 2 diabetes mellitus with diabetic peripheral angiopathy without gangrene ([place]) 03/18/2014  . Anxiety disorder, unspecified 02/23/2014  . Fever, unspecified 02/23/2014  . Headache 02/23/2014  . Pain, unspecified 02/23/2014  . Pruritus, unspecified 02/23/2014  . Mild protein-calorie malnutrition ([place]) 12/26/2013  . ESRD (end stage renal disease) on dialysis ([place]) 11/11/2013  . Snoring 11/11/2013  . Unspecified sleep apnea 11/11/2013  . Other pancytopenia ([place]) 09/22/2013  . Hemoptysis 09/20/2013  . Personal history of nicotine dependence 07/12/2013  . Coagulation defect, unspecified ([place]) 05/16/2013  . Iron deficiency anemia, unspecified 05/06/2013  . Other disorders of plasma-protein metabolism, not elsewhere classified 04/15/2013  . 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 chronic kidney disease or end stage renal disease ([place]) 03/18/2013  . Other cardiomyopathies ([place]) 03/18/2013  . Secondary hyperparathyroidism of  renal origin ([place]) 03/18/2013  . Type 2 diabetes mellitus without complications ([place]) 88/45/7334  . Pre-transplant evaluation for kidney transplant 01/11/2013  . Automatic implantable cardioverter-defibrillator in situ 10/01/2010  . HYPERCHOLESTEROLEMIA, MIXED 05/03/2010  . Essential hypertension 05/03/2010  . CHF 05/03/2010    Jamey Reas, PT, DPT 10/09/2020, 4:30 PM  [facility] [address] [place], [place], [address] Phone: [phone]    Fax:  [phone]  Name: Timothy Faulkner MRN: [id number] Date of Birth: Sep 28, 1945

## 2020-10-10 DIAGNOSIS — D689 Coagulation defect, unspecified: Secondary | ICD-10-CM | POA: Diagnosis not present

## 2020-10-10 DIAGNOSIS — N2581 Secondary hyperparathyroidism of renal origin: Secondary | ICD-10-CM | POA: Diagnosis not present

## 2020-10-10 DIAGNOSIS — N186 End stage renal disease: Secondary | ICD-10-CM | POA: Diagnosis not present

## 2020-10-10 DIAGNOSIS — Z992 Dependence on renal dialysis: Secondary | ICD-10-CM | POA: Diagnosis not present

## 2020-10-11 ENCOUNTER — Other Ambulatory Visit: Payer: Self-pay

## 2020-10-11 ENCOUNTER — Encounter: Payer: Self-pay | Admitting: Physical Therapy

## 2020-10-11 ENCOUNTER — Ambulatory Visit (INDEPENDENT_AMBULATORY_CARE_PROVIDER_SITE_OTHER): Payer: Medicare Other | Admitting: Physical Therapy

## 2020-10-11 DIAGNOSIS — M6281 Muscle weakness (generalized): Secondary | ICD-10-CM | POA: Diagnosis not present

## 2020-10-11 DIAGNOSIS — R2681 Unsteadiness on feet: Secondary | ICD-10-CM | POA: Diagnosis not present

## 2020-10-11 DIAGNOSIS — M25661 Stiffness of right knee, not elsewhere classified: Secondary | ICD-10-CM

## 2020-10-11 DIAGNOSIS — R2689 Other abnormalities of gait and mobility: Secondary | ICD-10-CM | POA: Diagnosis not present

## 2020-10-11 DIAGNOSIS — R293 Abnormal posture: Secondary | ICD-10-CM | POA: Diagnosis not present

## 2020-10-11 NOTE — Therapy (Signed)
[facility] [address] [place], [place], [address] Phone: [phone]   Fax:  [phone]  Physical Therapy Treatment  Patient Details  Name: Timothy Faulkner MRN: [id number] Date of Birth: Jul 18, 1946 Referring Provider (PT): Monica Martinez, MD   Encounter Date: 10/11/2020   PT End of Session - 10/11/20 1543    Visit Number 5    Number of Visits 25    Date for PT Re-Evaluation 12/24/20    Authorization Type UHC Medicare    PT Start Time 6433    PT Stop Time 1520    PT Time Calculation (min) 41 min    Equipment Utilized During Treatment Gait belt    Activity Tolerance Patient tolerated treatment well;Patient limited by fatigue    Behavior During Therapy [facility] for tasks assessed/performed           Past Medical History:  Diagnosis Date  . Anemia   . Arthritis    Gout  . Automatic implantable cardioverter-defibrillator in situ    [employer]  . CHF   . ESRD on dialysis [facility])    [name] 03/11/2013 (03/11/2013) dialysis M/W/F  . Gangrene ([place])    left foot  . GERD (gastroesophageal reflux disease)   . Gout    "once a year"  . Heart murmur   . HYPERCHOLESTEROLEMIA, MIXED   . HYPERTENSION   . Macular degeneration   . Osteomyelitis ([place])    right foot  . 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 Pacemaker   . Peripheral arterial disease (HCC)    left fifth toe ulcer, healing  . Pneumonia   . Shortness of breath   . 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 Wears dentures   . Wears glasses     Past Surgical History:  Procedure Laterality Date  . A/V FISTULAGRAM Left 07/20/2018   Procedure: A/V FISTULAGRAM;  Surgeon: [name], MD;  Location: [place] [facility];  Service: Cardiovascular;  Laterality: Left;  . A/V FISTULAGRAM N/A 07/15/2019   Procedure: A/V FISTULAGRAM - Left Arm;  Surgeon: Elam Dutch, MD;  Location: [place] [facility];  Service: Cardiovascular;   Laterality: N/A;  . ABDOMINAL AORTOGRAM N/A 02/25/2019   Procedure: ABDOMINAL AORTOGRAM;  Surgeon: Elam Dutch, MD;  Location: [place] [facility];  Service: Cardiovascular;  Laterality: N/A;  . ABDOMINAL AORTOGRAM W/LOWER EXTREMITY Bilateral 07/20/2018   Procedure: ABDOMINAL AORTOGRAM W/LOWER EXTREMITY;  Surgeon: [name], MD;  Location: [place] [facility];  Service: Cardiovascular;  Laterality: Bilateral;  . AMPUTATION Right 09/07/2018   Procedure: Right fifth metatarsectomy;  Surgeon: [name], DPM;  Location: [place];  Service: Podiatry;  Laterality: Right;  . AMPUTATION Left 03/08/2019   Procedure: AMPUTATION  3RD AND 4TH TOES LEFT FOOT;  Surgeon: [name], DPM;  Location: [place];  Service: Podiatry;  Laterality: Left;  . AMPUTATION Left 03/15/2019   Procedure: AMPUTATION BELOW KNEE;  Surgeon: Elam Dutch, MD;  Location: [facility] OR;  Service: Vascular;  Laterality: Left;  . AV FISTULA PLACEMENT Right 12/13/2012   Procedure: ARTERIOVENOUS (AV) FISTULA CREATION;  Surgeon: [name], MD;  Location: [place];  Service: Vascular;  Laterality: Right;  Ultrasound guided  . AV FISTULA PLACEMENT Left 05/07/2016   Procedure: LEFT RADIOCEPHALIC ARTERIOVENOUS (AV) FISTULA CREATION;  Surgeon: [name], MD;  Location: [place];  Service: Vascular;  Laterality: Left;  . AV FISTULA PLACEMENT  Right 08/09/2019   Procedure: INSERTION OF RIGHT ARTERIOVENOUS (AV) 4-29mm GORE-TEX STRETCH GRAFT RIGHT  ARM;  Surgeon: Elam Dutch, MD;  Location: [place];  Service: Vascular;  Laterality: Right;  . BASCILIC VEIN TRANSPOSITION Right 03/26/2016   Procedure: RIGHT BASILIC VEIN TRANSPOSITION;  Surgeon: [name], MD;  Location: [place];  Service: Vascular;  Laterality: Right;  . CARDIAC CATHETERIZATION    . CARDIAC DEFIBRILLATOR PLACEMENT     Chemical engineer  . CATARACT EXTRACTION W/PHACO Bilateral   . EYE SURGERY Bilateral    Cataract  . FISTULOGRAM Left 04/22/2018   Procedure: FISTULOGRAM  UPPER EXTREMITY;  Surgeon: [name], MD;  Location: [place];  Service: Vascular;  Laterality: Left;  . I & D EXTREMITY Right 07/15/2018   Procedure: IRRIGATION AND DEBRIDEMENT RIGHT FOOT;  Surgeon: [name], DPM;  Location: [place];  Service: Podiatry;  Laterality: Right;  . INCISION AND DRAINAGE ABSCESS / HEMATOMA OF BURSA / KNEE / THIGH Left 2012   "knee" (03/11/2013)  . INSERT / REPLACE / REMOVE PACEMAKER    . INSERTION OF DIALYSIS CATHETER Left 04/22/2018   Procedure: INSERTION OF DIALYSIS CATHETER;  Surgeon: [name], MD;  Location: [place];  Service: Vascular;  Laterality: Left;  . IR FLUORO GUIDE CV LINE LEFT  07/15/2018  . IR PTA VENOUS EXCEPT DIALYSIS CIRCUIT  07/15/2018  . LOWER EXTREMITY ANGIOGRAPHY Right 07/21/2018   Procedure: LOWER EXTREMITY ANGIOGRAPHY;  Surgeon: [name], MD;  Location: [place] [facility];  Service: Cardiovascular;  Laterality: Right;  . LOWER EXTREMITY ANGIOGRAPHY Bilateral 02/25/2019   Procedure: Lower Extremity Angiography;  Surgeon: Elam Dutch, MD;  Location: [place] [facility];  Service: Cardiovascular;  Laterality: Bilateral;  . METATARSAL HEAD EXCISION Right 07/15/2018   Procedure: METATARSAL RESECTION;  Surgeon: [name], DPM;  Location: [place];  Service: Podiatry;  Laterality: Right;  . MULTIPLE TOOTH EXTRACTIONS    . PERIPHERAL VASCULAR ATHERECTOMY Right 02/28/2019   Procedure: PERIPHERAL VASCULAR ATHERECTOMY;  Surgeon: Waynetta Sandy, MD;  Location: [place] [facility];  Service: Cardiovascular;  Laterality: Right;  right tp trunk   . PERIPHERAL VASCULAR BALLOON ANGIOPLASTY Left 02/25/2019   Procedure: PERIPHERAL VASCULAR BALLOON ANGIOPLASTY;  Surgeon: Elam Dutch, MD;  Location: [place] [facility];  Service: Cardiovascular;  Laterality: Left;  tibial peroneal trunk and PT  . PERIPHERAL VASCULAR INTERVENTION Right 07/21/2018   Procedure: PERIPHERAL VASCULAR INTERVENTION;  Surgeon: [name], MD;  Location: [place] [facility];  Service: Cardiovascular;  Laterality: Right;  peroneal stents  . REVISON OF ARTERIOVENOUS FISTULA Right 01/10/931   Procedure: Plication OF Right Arm ARTERIOVENOUS FISTULA;  Surgeon: Elam Dutch, MD;  Location: [facility] OR;  Service: Vascular;  Laterality: Right;  . REVISON OF ARTERIOVENOUS FISTULA Left 04/22/2018   Procedure: REVISION OF RADIOCEPHALIC ARTERIOVENOUS FISTULA;  Surgeon: [name], MD;  Location: [place];  Service: Vascular;  Laterality: Left;  . SHUNTOGRAM N/A 05/31/2013   Procedure: Earney Mallet;  Surgeon: [name], MD;  Location: [facility] CATH LAB;  Service: Cardiovascular;  Laterality: N/A;  . UPPER EXTREMITY VENOGRAPHY Right 07/23/2018   Procedure: UPPER EXTREMITY VENOGRAPHY;  Surgeon: [name], MD;  Location: [place] [facility];  Service: Cardiovascular;  Laterality: Right;  . UPPER EXTREMITY VENOGRAPHY  07/15/2019   Procedure: UPPER EXTREMITY VENOGRAPHY;  Surgeon: Elam Dutch, MD;  Location: [place] [facility];  Service: Cardiovascular;;  rt arm   .  WOUND DEBRIDEMENT Right 07/17/2018   Procedure: Wound Debridement; Closure Filleted toe flap Right Foot;  Surgeon: [name], DPM;  Location: [place];  Service: Podiatry;  Laterality: Right;  . WOUND DEBRIDEMENT Right 09/07/2018   Procedure: Debridement Right Foot Wound, application of wound vac;  Surgeon: [name], DPM;  Location: [place];  Service: Podiatry;  Laterality: Right;  . WOUND DEBRIDEMENT Right 03/08/2019   Procedure: DEBRIDEMENT WOUND RIGHT FOOT;  Surgeon: [name], DPM;  Location: [place];  Service: Podiatry;  Laterality: Right;    There were no vitals filed for this visit.   Subjective Assessment - 10/11/20 1523    Subjective Relays he is wearing prosthesis 3 hrs for 2X per day now. He denies any redness or issues with prosthesis or any pain upon arrival.    Pertinent History PAD, DM2, arthritis, gout, CHF, implanted  cardioverter-defib, HTN, CKD w/ ESRD    Patient Stated Goals to use prosthesis to walk in community,    Currently in Pain? No/denies             [facility] Adult PT Treatment/Exercise - 10/11/20 0001      Transfers   Transfers Sit to Stand;Stand to Sit;Squat Pivot Transfers    Sit to Stand 5: Supervision;With upper extremity assist;With armrests;From chair/3-in-1;Other (comment)    Stand to Sit 5: Supervision;With upper extremity assist;With armrests;To chair/3-in-1;Other (comment)    Stand to Sit Details (indicate cue type and reason) Visual cues/gestures for precautions/safety;Verbal cues for technique   warned him to avoid pivot/twist with sit to stand and to be close and square/directly in front of chair before sitting down     Ambulation/Gait   Ambulation/Gait Yes    Ambulation/Gait Assistance 4: Min guard;5: Supervision   started with min guard 50 ft X 2 and progressed to supervision 30 ft with 2 obstacles to negotiate around   Assistive device Rolling walker;Prosthesis    Ramp 2: Max assist    Ramp Details (indicate cue type and reason) demo, verbal & tactile cues on technique    Curb 4: Min assist    Curb Details (indicate cue type and reason) 4inch curb, demo, verbal & tactile cues on technique      Neuro Re-ed    Neuro Re-ed Details  balance at sink no UE support for lateral weight shifts, moving cups into tall cabinent at bottom shelf and top shelf. Holding on with one UE support for hip abd X 10 reps bilat, then hip marches X 10 reps bilat      Exercises   Exercises Other Exercises    Other Exercises  sit to stand from barstool without UE push up 5 reps with min guard                    PT Short Term Goals - 09/25/20 2128      PT SHORT TERM GOAL #1   Title Patient donnes prosthesis with verbal cues only.    Time 4    Period Weeks    Status New    Target Date 10/25/20      PT SHORT TERM GOAL #2   Title Patient tolerates prosthesis wear >8hrs total on  nondialysis days.    Time 4    Period Weeks    Status New    Target Date 10/25/20      PT SHORT TERM GOAL #3   Title Patient standing balance with RW & prosthesis reaching 7" anteriorly &  within 10" of floor with supervision.    Time 4    Period Weeks    Status New    Target Date 10/25/20      PT SHORT TERM GOAL #4   Title Patient ambulates 63" with RW & prosthesis with minA.    Time 4    Period Weeks    Status New    Target Date 10/25/20             PT Long Term Goals - 09/25/20 2123      PT LONG TERM GOAL #1   Title Patient & family verbalize & demonstrate understanding of proper prosthetic care to enable utilization of prosthesis.    Time 12    Period Weeks    Status New    Target Date 12/20/20      PT LONG TERM GOAL #2   Title Patient tolerates wear of prosthesis >90% of awake hours without skin or limb discomfort issues.    Time 12    Period Weeks    Status New    Target Date 12/20/20      PT LONG TERM GOAL #3   Title [place] Balance >/= 36/56    Time 12    Period Weeks    Status New    Target Date 12/20/20      PT LONG TERM GOAL #4   Title Patient ambulates 200' with RW & prosthesis modified independent.    Time 12    Period Weeks    Status New    Target Date 12/20/20      PT LONG TERM GOAL #5   Title Patient negotiates ramps & curbs with RW & prosthesis with minA.    Time 12    Period Weeks    Status New    Target Date 12/20/20                 Plan - 10/11/20 1546    Clinical Impression Statement Session focused on prosthetic training with functional mobility and gait. He did have increased distal limb pressure/pain upon walking so we added 3 ply sock which relieved this. Reviewed with him how to determine the need to add/reduce ply socks as necessary. He was able to progress gate from min A to supervision with RW for short distances so PT feels he can ambulate at home with family members close by.    Personal Factors and Comorbidities  Comorbidity 3+;Fitness;Time since onset of injury/illness/exacerbation    Comorbidities PAD, DM2, arthritis, gout, CHF, implanted cardioverter-defib, HTN, CKD w/ ESRD, blind left knee    Examination-Activity Limitations Locomotion Level;Stairs;Stand;Transfers    Examination-Participation Restrictions Community Activity    Stability/Clinical Decision Making Evolving/Moderate complexity    Rehab Potential Good    PT Frequency 2x / week    PT Duration 12 weeks    PT Treatment/Interventions ADLs/Self Care Home Management;DME Instruction;Gait training;Stair training;Functional mobility training;Therapeutic activities;Therapeutic exercise;Balance training;Neuromuscular re-education;Patient/family education;Prosthetic Training;Manual techniques;Vestibular    PT Next Visit Plan review prosthetic care,  prosthetic gait with RW including ramps & curbs,  exercise for strength, balance, flexibility & endurance.    Consulted and Agree with Plan of Care Patient;Family member/caregiver    Family Member Consulted daughter,Sherry Cira Servant           Patient will benefit from skilled therapeutic intervention in order to improve the following deficits and impairments:  Abnormal gait,Cardiopulmonary status limiting activity,Decreased activity tolerance,Decreased balance,Decreased endurance,Decreased knowledge of use of DME,Decreased mobility,Decreased range of motion,Decreased scar  mobility,Decreased strength,Increased edema,Postural dysfunction,Prosthetic Dependency  Visit Diagnosis: Unsteadiness on feet  Other abnormalities of gait and mobility  Muscle weakness (generalized)  Stiffness of right knee, not elsewhere classified  Abnormal posture     Problem List Patient Active Problem List   Diagnosis Date Noted  . Polycythemia, secondary 02/21/2020  . Severe nonproliferative diabetic retinopathy of right eye without macular edema associated with type 2 diabetes mellitus ([place]) 11/29/2019  . Severe  nonproliferative diabetic retinopathy of left eye without macular edema associated with type 2 diabetes mellitus ([place]) 11/29/2019  . Advanced nonexudative age-related macular degeneration of both eyes with subfoveal involvement 11/29/2019  . 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  . Sepsis ([place]) 04/14/2019  . Hypocalcemia 04/08/2019  . Drug induced constipation   . Anemia of chronic disease   . ESRD on dialysis ([place])   . Postoperative pain   . Phantom limb pain ([place])   . S/P BKA (below knee amputation), left ([place]) 03/18/2019  . Unilateral complete BKA, left, initial encounter ([place]) 03/17/2019  . Critical lower limb ischemia ([place]) 03/12/2019  . Infection of amputation stump, right lower extremity ([place]) 03/10/2019  . Hypotension 02/23/2019  . Cellulitis of left foot 02/23/2019  . Gangrene of toe of left foot ([place]) 02/22/2019  . Complication of vascular dialysis catheter 11/24/2018  . Chronic osteomyelitis involving right ankle and foot ([place])   . Ulcer of right foot with fat layer exposed ([place]) 09/02/2018  . Encounter for planned post-operative wound closure   . Acute osteomyelitis of metatarsal bone of right foot ([place])   . Diabetic ulcer of midfoot associated with diabetes mellitus due to underlying condition, with necrosis of bone ([place])   . Osteomyelitis of right foot ([place])   . Vascular calcification   . Cellulitis and abscess of foot, except toes   . Anemia associated with chronic renal failure 07/13/2018  . Cardiomyopathy, dilated, nonischemic ([place]) 07/13/2018  . Diabetic foot infection ([place]) 07/13/2018  . PAD (peripheral artery disease) ([place])   . Macular degeneration   . Ventricular tachycardia ([place]) 06/24/2017  . Embolism due to vascular prosthetic devices, implants and grafts, initial encounter ([place]) 02/04/2016  . Dependence on renal dialysis ([place]) 05/30/2015  . Shortness of breath 02/05/2015  . Chronic systolic heart failure ([place]) 08/22/2014  . Diabetic  infection of right foot ([place]) 07/13/2014  . Type 2 diabetes mellitus with diabetic peripheral angiopathy without gangrene ([place]) 03/18/2014  . Anxiety disorder, unspecified 02/23/2014  . Fever, unspecified 02/23/2014  . Headache 02/23/2014  . Pain, unspecified 02/23/2014  . Pruritus, unspecified 02/23/2014  . Mild protein-calorie malnutrition ([place]) 12/26/2013  . ESRD (end stage renal disease) on dialysis ([place]) 11/11/2013  . Snoring 11/11/2013  . Unspecified sleep apnea 11/11/2013  . Other pancytopenia ([place]) 09/22/2013  . Hemoptysis 09/20/2013  . Personal history of nicotine dependence 07/12/2013  . Coagulation defect, unspecified ([place]) 05/16/2013  . Iron deficiency anemia, unspecified 05/06/2013  . Other disorders of plasma-protein metabolism, not elsewhere classified 04/15/2013  . 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 chronic kidney disease or end stage renal disease ([place]) 03/18/2013  . Other cardiomyopathies ([place]) 03/18/2013  . Secondary hyperparathyroidism of renal origin ([place]) 03/18/2013  . Type 2 diabetes mellitus without complications ([place]) 29/93/7169  . Pre-transplant evaluation for kidney transplant 01/11/2013  . Automatic implantable cardioverter-defibrillator in situ 10/01/2010  . HYPERCHOLESTEROLEMIA, MIXED 05/03/2010  . Essential hypertension 05/03/2010  . CHF 05/03/2010    Silvestre Mesi 10/11/2020, 3:57 PM  [facility]  [address] [place], [place], [address] Phone: [phone]   Fax:  [phone]  Name: LAURIE LOVEJOY MRN: [id number] Date of Birth: 1946/07/17

## 2020-10-12 DIAGNOSIS — D689 Coagulation defect, unspecified: Secondary | ICD-10-CM | POA: Diagnosis not present

## 2020-10-12 DIAGNOSIS — N186 End stage renal disease: Secondary | ICD-10-CM | POA: Diagnosis not present

## 2020-10-12 DIAGNOSIS — N2581 Secondary hyperparathyroidism of renal origin: Secondary | ICD-10-CM | POA: Diagnosis not present

## 2020-10-12 DIAGNOSIS — Z992 Dependence on renal dialysis: Secondary | ICD-10-CM | POA: Diagnosis not present

## 2020-10-15 DIAGNOSIS — N2581 Secondary hyperparathyroidism of renal origin: Secondary | ICD-10-CM | POA: Diagnosis not present

## 2020-10-15 DIAGNOSIS — Z992 Dependence on renal dialysis: Secondary | ICD-10-CM | POA: Diagnosis not present

## 2020-10-15 DIAGNOSIS — N186 End stage renal disease: Secondary | ICD-10-CM | POA: Diagnosis not present

## 2020-10-15 DIAGNOSIS — D689 Coagulation defect, unspecified: Secondary | ICD-10-CM | POA: Diagnosis not present

## 2020-10-16 ENCOUNTER — Ambulatory Visit (INDEPENDENT_AMBULATORY_CARE_PROVIDER_SITE_OTHER): Payer: Medicare Other | Admitting: Physical Therapy

## 2020-10-16 ENCOUNTER — Encounter: Payer: Self-pay | Admitting: Physical Therapy

## 2020-10-16 ENCOUNTER — Other Ambulatory Visit: Payer: Self-pay

## 2020-10-16 ENCOUNTER — Ambulatory Visit (INDEPENDENT_AMBULATORY_CARE_PROVIDER_SITE_OTHER): Payer: Medicare Other | Admitting: Podiatry

## 2020-10-16 DIAGNOSIS — R2689 Other abnormalities of gait and mobility: Secondary | ICD-10-CM | POA: Diagnosis not present

## 2020-10-16 DIAGNOSIS — I739 Peripheral vascular disease, unspecified: Secondary | ICD-10-CM

## 2020-10-16 DIAGNOSIS — R293 Abnormal posture: Secondary | ICD-10-CM | POA: Diagnosis not present

## 2020-10-16 DIAGNOSIS — M6281 Muscle weakness (generalized): Secondary | ICD-10-CM | POA: Diagnosis not present

## 2020-10-16 DIAGNOSIS — L97511 Non-pressure chronic ulcer of other part of right foot limited to breakdown of skin: Secondary | ICD-10-CM

## 2020-10-16 DIAGNOSIS — M25661 Stiffness of right knee, not elsewhere classified: Secondary | ICD-10-CM | POA: Diagnosis not present

## 2020-10-16 DIAGNOSIS — R2681 Unsteadiness on feet: Secondary | ICD-10-CM | POA: Diagnosis not present

## 2020-10-16 NOTE — Therapy (Signed)
[facility] Physical Therapy [address] [place], [place], [address] Phone: [phone]   Fax:  [phone]  Physical Therapy Treatment  Patient Details  Name: Timothy Faulkner MRN: [id number] Date of Birth: 1945-11-21 Referring Provider (PT): Monica Martinez, MD   Encounter Date: 10/16/2020   PT End of Session - 10/16/20 1432    Visit Number 6    Number of Visits 25    Date for PT Re-Evaluation 12/24/20    Authorization Type UHC Medicare    PT Start Time 1324    PT Stop Time 1514    PT Time Calculation (min) 43 min    Equipment Utilized During Treatment Gait belt    Activity Tolerance Patient tolerated treatment well;Patient limited by fatigue    Behavior During Therapy [facility] for tasks assessed/performed           Past Medical History:  Diagnosis Date  . Anemia   . Arthritis    Gout  . Automatic implantable cardioverter-defibrillator in situ    [employer]  . CHF   . ESRD on dialysis [facility])    [name] 03/11/2013 (03/11/2013) dialysis M/W/F  . Gangrene ([place])    left foot  . GERD (gastroesophageal reflux disease)   . Gout    "once a year"  . Heart murmur   . HYPERCHOLESTEROLEMIA, MIXED   . HYPERTENSION   . Macular degeneration   . Osteomyelitis ([place])    right foot  . 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 Pacemaker   . Peripheral arterial disease (HCC)    left fifth toe ulcer, healing  . Pneumonia   . Shortness of breath   . 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 Wears dentures   . Wears glasses     Past Surgical History:  Procedure Laterality Date  . A/V FISTULAGRAM Left 07/20/2018   Procedure: A/V FISTULAGRAM;  Surgeon: [name], MD;  Location: [place] [facility];  Service: Cardiovascular;  Laterality: Left;  . A/V FISTULAGRAM N/A 07/15/2019   Procedure: A/V FISTULAGRAM - Left Arm;  Surgeon: Elam Dutch, MD;  Location: [place] [facility];  Service: Cardiovascular;   Laterality: N/A;  . ABDOMINAL AORTOGRAM N/A 02/25/2019   Procedure: ABDOMINAL AORTOGRAM;  Surgeon: Elam Dutch, MD;  Location: [place] [facility];  Service: Cardiovascular;  Laterality: N/A;  . ABDOMINAL AORTOGRAM W/LOWER EXTREMITY Bilateral 07/20/2018   Procedure: ABDOMINAL AORTOGRAM W/LOWER EXTREMITY;  Surgeon: [name], MD;  Location: [place] [facility];  Service: Cardiovascular;  Laterality: Bilateral;  . AMPUTATION Right 09/07/2018   Procedure: Right fifth metatarsectomy;  Surgeon: [name], DPM;  Location: [place];  Service: Podiatry;  Laterality: Right;  . AMPUTATION Left 03/08/2019   Procedure: AMPUTATION  3RD AND 4TH TOES LEFT FOOT;  Surgeon: [name], DPM;  Location: [place];  Service: Podiatry;  Laterality: Left;  . AMPUTATION Left 03/15/2019   Procedure: AMPUTATION BELOW KNEE;  Surgeon: Elam Dutch, MD;  Location: [facility] OR;  Service: Vascular;  Laterality: Left;  . AV FISTULA PLACEMENT Right 12/13/2012   Procedure: ARTERIOVENOUS (AV) FISTULA CREATION;  Surgeon: [name], MD;  Location: [place];  Service: Vascular;  Laterality: Right;  Ultrasound guided  . AV FISTULA PLACEMENT Left 05/07/2016   Procedure: LEFT RADIOCEPHALIC ARTERIOVENOUS (AV) FISTULA CREATION;  Surgeon: [name], MD;  Location: [place];  Service: Vascular;  Laterality: Left;  . AV FISTULA PLACEMENT  Right 08/09/2019   Procedure: INSERTION OF RIGHT ARTERIOVENOUS (AV) 4-52mm GORE-TEX STRETCH GRAFT RIGHT  ARM;  Surgeon: Elam Dutch, MD;  Location: [place];  Service: Vascular;  Laterality: Right;  . BASCILIC VEIN TRANSPOSITION Right 03/26/2016   Procedure: RIGHT BASILIC VEIN TRANSPOSITION;  Surgeon: [name], MD;  Location: [place];  Service: Vascular;  Laterality: Right;  . CARDIAC CATHETERIZATION    . CARDIAC DEFIBRILLATOR PLACEMENT     Chemical engineer  . CATARACT EXTRACTION W/PHACO Bilateral   . EYE SURGERY Bilateral    Cataract  . FISTULOGRAM Left 04/22/2018   Procedure: FISTULOGRAM  UPPER EXTREMITY;  Surgeon: [name], MD;  Location: [place];  Service: Vascular;  Laterality: Left;  . I & D EXTREMITY Right 07/15/2018   Procedure: IRRIGATION AND DEBRIDEMENT RIGHT FOOT;  Surgeon: [name], DPM;  Location: [place];  Service: Podiatry;  Laterality: Right;  . INCISION AND DRAINAGE ABSCESS / HEMATOMA OF BURSA / KNEE / THIGH Left 2012   "knee" (03/11/2013)  . INSERT / REPLACE / REMOVE PACEMAKER    . INSERTION OF DIALYSIS CATHETER Left 04/22/2018   Procedure: INSERTION OF DIALYSIS CATHETER;  Surgeon: [name], MD;  Location: [place];  Service: Vascular;  Laterality: Left;  . IR FLUORO GUIDE CV LINE LEFT  07/15/2018  . IR PTA VENOUS EXCEPT DIALYSIS CIRCUIT  07/15/2018  . LOWER EXTREMITY ANGIOGRAPHY Right 07/21/2018   Procedure: LOWER EXTREMITY ANGIOGRAPHY;  Surgeon: [name], MD;  Location: [place] [facility];  Service: Cardiovascular;  Laterality: Right;  . LOWER EXTREMITY ANGIOGRAPHY Bilateral 02/25/2019   Procedure: Lower Extremity Angiography;  Surgeon: Elam Dutch, MD;  Location: [place] [facility];  Service: Cardiovascular;  Laterality: Bilateral;  . METATARSAL HEAD EXCISION Right 07/15/2018   Procedure: METATARSAL RESECTION;  Surgeon: [name], DPM;  Location: [place];  Service: Podiatry;  Laterality: Right;  . MULTIPLE TOOTH EXTRACTIONS    . PERIPHERAL VASCULAR ATHERECTOMY Right 02/28/2019   Procedure: PERIPHERAL VASCULAR ATHERECTOMY;  Surgeon: Waynetta Sandy, MD;  Location: [place] [facility];  Service: Cardiovascular;  Laterality: Right;  right tp trunk   . PERIPHERAL VASCULAR BALLOON ANGIOPLASTY Left 02/25/2019   Procedure: PERIPHERAL VASCULAR BALLOON ANGIOPLASTY;  Surgeon: Elam Dutch, MD;  Location: [place] [facility];  Service: Cardiovascular;  Laterality: Left;  tibial peroneal trunk and PT  . PERIPHERAL VASCULAR INTERVENTION Right 07/21/2018   Procedure: PERIPHERAL VASCULAR INTERVENTION;  Surgeon: [name], MD;  Location: [place] [facility];  Service: Cardiovascular;  Laterality: Right;  peroneal stents  . REVISON OF ARTERIOVENOUS FISTULA Right 1/61/0960   Procedure: Plication OF Right Arm ARTERIOVENOUS FISTULA;  Surgeon: Elam Dutch, MD;  Location: [facility] OR;  Service: Vascular;  Laterality: Right;  . REVISON OF ARTERIOVENOUS FISTULA Left 04/22/2018   Procedure: REVISION OF RADIOCEPHALIC ARTERIOVENOUS FISTULA;  Surgeon: [name], MD;  Location: [place];  Service: Vascular;  Laterality: Left;  . SHUNTOGRAM N/A 05/31/2013   Procedure: [place] Mal[place];  Surgeon: [name], MD;  Location: [facility] CATH LAB;  Service: Cardiovascular;  Laterality: N/A;  . UPPER EXTREMITY VENOGRAPHY Right 07/23/2018   Procedure: UPPER EXTREMITY VENOGRAPHY;  Surgeon: [name], MD;  Location: [place] [facility];  Service: Cardiovascular;  Laterality: Right;  . UPPER EXTREMITY VENOGRAPHY  07/15/2019   Procedure: UPPER EXTREMITY VENOGRAPHY;  Surgeon: Elam Dutch, MD;  Location: [place] [facility];  Service: Cardiovascular;;  rt arm   .  WOUND DEBRIDEMENT Right 07/17/2018   Procedure: Wound Debridement; Closure Filleted toe flap Right Foot;  Surgeon: [name], DPM;  Location: [place];  Service: Podiatry;  Laterality: Right;  . WOUND DEBRIDEMENT Right 09/07/2018   Procedure: Debridement Right Foot Wound, application of wound vac;  Surgeon: [name], DPM;  Location: [place];  Service: Podiatry;  Laterality: Right;  . WOUND DEBRIDEMENT Right 03/08/2019   Procedure: DEBRIDEMENT WOUND RIGHT FOOT;  Surgeon: [name], DPM;  Location: [place];  Service: Podiatry;  Laterality: Right;    There were no vitals filed for this visit.   Subjective Assessment - 10/16/20 1431    Subjective On nondialysis days, he wears prosthesis 3hrs 2x/day without issues. He sleeps most of day.    Pertinent History PAD, DM2, arthritis, gout, CHF, implanted cardioverter-defib, HTN, CKD w/ ESRD     Patient Stated Goals to use prosthesis to walk in community,    Currently in Pain? No/denies                             [facility] Adult PT Treatment/Exercise - 10/16/20 1431      Transfers   Transfers Sit to Stand;Stand to Sit;Squat Pivot Transfers    Sit to Stand 5: Supervision;With upper extremity assist;With armrests;From chair/3-in-1;Other (comment)   requires RW to stabilize   Stand to Sit 5: Supervision;With upper extremity assist;With armrests;To chair/3-in-1;Other (comment)   requires RW   Stand to Sit Details (indicate cue type and reason) --      Ambulation/Gait   Ambulation/Gait Yes    Ambulation/Gait Assistance 4: Min guard    Ambulation/Gait Assistance Details demo, verbal & tactile cues on upright posture, RW distance, step width & wt shift over prosthesis in stance    Ambulation Distance (Feet) 60 Feet    Assistive device Rolling walker;Prosthesis    Ramp --    Curb --      High Level Balance   High Level Balance Activities Side stepping;Backward walking   //bars with BUE support   High Level Balance Comments tactile & verbal cues on upright posture, wt shift & engaging prosthetic limb      Therapeutic Activites    Therapeutic Activities Other Therapeutic Activities    Other Therapeutic Activities upright posture without UE support sitting on 24" stool. And benefits to using at home. pt & dtr verbalized understanding.      Neuro Re-ed    Neuro Re-ed Details  standing with feet hip width apart with mirror for visual, tactile cues for balance reactions & verbal cues on technique - head movements rt/left & up/down.      Exercises   Exercises Knee/Hip      Knee/Hip Exercises: Aerobic   Nustep seat 11 initially then 12, level 5 with BLEs & BUEs for 8 min      Prosthetics   Prosthetic Care Comments  increase wear to 4hrs 2x/day on nondialysis days and up to 4hr after dialysis if out of bed.    Current prosthetic wear tolerance (days/week)  nondialysis  days daily,    Current prosthetic wear tolerance (#hours/day)  3hrs 2x/day on nondialysis days    Current prosthetic weight-bearing tolerance (hours/day)  Patient tolerated 5 min standing multiple reps with limb pain ~5/10    Edema pitting edema    Residual limb condition  No open areas, dry skin, normal temperature, normal color, cylinerical shape    Education Provided Skin  check;Residual limb care;Correct ply sock adjustment;Proper Donning;Proper wear schedule/adjustment    Person(s) Educated Patient;Child(ren)    Education Method Explanation;Tactile cues;Verbal cues;Demonstration    Education Method Verbalized understanding;Tactile cues required;Verbal cues required;Needs further instruction    Donning Prosthesis Minimal assist                    PT Short Term Goals - 09/25/20 2128      PT SHORT TERM GOAL #1   Title Patient donnes prosthesis with verbal cues only.    Time 4    Period Weeks    Status New    Target Date 10/25/20      PT SHORT TERM GOAL #2   Title Patient tolerates prosthesis wear >8hrs total on nondialysis days.    Time 4    Period Weeks    Status New    Target Date 10/25/20      PT SHORT TERM GOAL #3   Title Patient standing balance with RW & prosthesis reaching 7" anteriorly & within 10" of floor with supervision.    Time 4    Period Weeks    Status New    Target Date 10/25/20      PT SHORT TERM GOAL #4   Title Patient ambulates 75" with RW & prosthesis with minA.    Time 4    Period Weeks    Status New    Target Date 10/25/20             PT Long Term Goals - 09/25/20 2123      PT LONG TERM GOAL #1   Title Patient & family verbalize & demonstrate understanding of proper prosthetic care to enable utilization of prosthesis.    Time 12    Period Weeks    Status New    Target Date 12/20/20      PT LONG TERM GOAL #2   Title Patient tolerates wear of prosthesis >90% of awake hours without skin or limb discomfort issues.    Time 12     Period Weeks    Status New    Target Date 12/20/20      PT LONG TERM GOAL #3   Title [place] Balance >/= 36/56    Time 12    Period Weeks    Status New    Target Date 12/20/20      PT LONG TERM GOAL #4   Title Patient ambulates 200' with RW & prosthesis modified independent.    Time 12    Period Weeks    Status New    Target Date 12/20/20      PT LONG TERM GOAL #5   Title Patient negotiates ramps & curbs with RW & prosthesis with minA.    Time 12    Period Weeks    Status New    Target Date 12/20/20                 Plan - 10/16/20 1436    Clinical Impression Statement PT worked on balance with tactile & manual cues for reactions required.  Pt improved prosthetic gait with instruction to improve weight shift over prosthesis in stance.    Personal Factors and Comorbidities Comorbidity 3+;Fitness;Time since onset of injury/illness/exacerbation    Comorbidities PAD, DM2, arthritis, gout, CHF, implanted cardioverter-defib, HTN, CKD w/ ESRD, blind left knee    Examination-Activity Limitations Locomotion Level;Stairs;Stand;Transfers    Examination-Participation Restrictions Community Activity    Stability/Clinical Decision Making Evolving/Moderate complexity  Rehab Potential Good    PT Frequency 2x / week    PT Duration 12 weeks    PT Treatment/Interventions ADLs/Self Care Home Management;DME Instruction;Gait training;Stair training;Functional mobility training;Therapeutic activities;Therapeutic exercise;Balance training;Neuromuscular re-education;Patient/family education;Prosthetic Training;Manual techniques;Vestibular    PT Next Visit Plan review prosthetic care,  prosthetic gait with RW including ramps & curbs,  exercise for strength, balance, flexibility & endurance.    Consulted and Agree with Plan of Care Patient;Family member/caregiver    Family Member Consulted daughter,Sherry Cira Servant           Patient will benefit from skilled therapeutic intervention in order to  improve the following deficits and impairments:  Abnormal gait,Cardiopulmonary status limiting activity,Decreased activity tolerance,Decreased balance,Decreased endurance,Decreased knowledge of use of DME,Decreased mobility,Decreased range of motion,Decreased scar mobility,Decreased strength,Increased edema,Postural dysfunction,Prosthetic Dependency  Visit Diagnosis: Unsteadiness on feet  Other abnormalities of gait and mobility  Muscle weakness (generalized)  Stiffness of right knee, not elsewhere classified  Abnormal posture     Problem List Patient Active Problem List   Diagnosis Date Noted  . Polycythemia, secondary 02/21/2020  . Severe nonproliferative diabetic retinopathy of right eye without macular edema associated with type 2 diabetes mellitus ([place]) 11/29/2019  . Severe nonproliferative diabetic retinopathy of left eye without macular edema associated with type 2 diabetes mellitus ([place]) 11/29/2019  . Advanced nonexudative age-related macular degeneration of both eyes with subfoveal involvement 11/29/2019  . 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  . Sepsis ([place]) 04/14/2019  . Hypocalcemia 04/08/2019  . Drug induced constipation   . Anemia of chronic disease   . ESRD on dialysis ([place])   . Postoperative pain   . Phantom limb pain ([place])   . S/P BKA (below knee amputation), left ([place]) 03/18/2019  . Unilateral complete BKA, left, initial encounter ([place]) 03/17/2019  . Critical lower limb ischemia ([place]) 03/12/2019  . Infection of amputation stump, right lower extremity ([place]) 03/10/2019  . Hypotension 02/23/2019  . Cellulitis of left foot 02/23/2019  . Gangrene of toe of left foot ([place]) 02/22/2019  . Complication of vascular dialysis catheter 11/24/2018  . Chronic osteomyelitis involving right ankle and foot ([place])   . Ulcer of right foot with fat layer exposed ([place]) 09/02/2018  . Encounter for planned post-operative wound closure   . Acute osteomyelitis of  metatarsal bone of right foot ([place])   . Diabetic ulcer of midfoot associated with diabetes mellitus due to underlying condition, with necrosis of bone ([place])   . Osteomyelitis of right foot ([place])   . Vascular calcification   . Cellulitis and abscess of foot, except toes   . Anemia associated with chronic renal failure 07/13/2018  . Cardiomyopathy, dilated, nonischemic ([place]) 07/13/2018  . Diabetic foot infection ([place]) 07/13/2018  . PAD (peripheral artery disease) ([place])   . Macular degeneration   . Ventricular tachycardia ([place]) 06/24/2017  . Embolism due to vascular prosthetic devices, implants and grafts, initial encounter ([place]) 02/04/2016  . Dependence on renal dialysis ([place]) 05/30/2015  . Shortness of breath 02/05/2015  . Chronic systolic heart failure ([place]) 08/22/2014  . Diabetic infection of right foot ([place]) 07/13/2014  . Type 2 diabetes mellitus with diabetic peripheral angiopathy without gangrene ([place]) 03/18/2014  . Anxiety disorder, unspecified 02/23/2014  . Fever, unspecified 02/23/2014  . Headache 02/23/2014  . Pain, unspecified 02/23/2014  . Pruritus, unspecified 02/23/2014  . Mild protein-calorie malnutrition ([place]) 12/26/2013  . ESRD (end stage renal disease) on dialysis ([place]) 11/11/2013  . Snoring 11/11/2013  . Unspecified sleep apnea 11/11/2013  .  Other pancytopenia ([place]) 09/22/2013  . Hemoptysis 09/20/2013  . Personal history of nicotine dependence 07/12/2013  . Coagulation defect, unspecified ([place]) 05/16/2013  . Iron deficiency anemia, unspecified 05/06/2013  . Other disorders of plasma-protein metabolism, not elsewhere classified 04/15/2013  . 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 Other cardiomyopathies ([place]) 03/18/2013  . Secondary hyperparathyroidism of renal origin ([place]) 03/18/2013  . Type 2 diabetes mellitus without complications ([place]) 33/82/5053  . Pre-transplant evaluation for  kidney transplant 01/11/2013  . Automatic implantable cardioverter-defibrillator in situ 10/01/2010  . HYPERCHOLESTEROLEMIA, MIXED 05/03/2010  . Essential hypertension 05/03/2010  . CHF 05/03/2010    Jamey Reas, PT, DPT 10/16/2020, 3:41 PM  [facility] Physical Therapy [address] [place], [place], [address] Phone: [phone]   Fax:  [phone]  Name: Timothy Faulkner MRN: [id number] Date of Birth: November 09, 1945

## 2020-10-17 DIAGNOSIS — N186 End stage renal disease: Secondary | ICD-10-CM | POA: Diagnosis not present

## 2020-10-17 DIAGNOSIS — D689 Coagulation defect, unspecified: Secondary | ICD-10-CM | POA: Diagnosis not present

## 2020-10-17 DIAGNOSIS — Z992 Dependence on renal dialysis: Secondary | ICD-10-CM | POA: Diagnosis not present

## 2020-10-17 DIAGNOSIS — N2581 Secondary hyperparathyroidism of renal origin: Secondary | ICD-10-CM | POA: Diagnosis not present

## 2020-10-18 ENCOUNTER — Ambulatory Visit (INDEPENDENT_AMBULATORY_CARE_PROVIDER_SITE_OTHER): Payer: Medicare Other | Admitting: Physical Therapy

## 2020-10-18 ENCOUNTER — Other Ambulatory Visit: Payer: Self-pay

## 2020-10-18 ENCOUNTER — Encounter: Payer: Self-pay | Admitting: Physical Therapy

## 2020-10-18 DIAGNOSIS — N186 End stage renal disease: Secondary | ICD-10-CM | POA: Diagnosis not present

## 2020-10-18 DIAGNOSIS — R2689 Other abnormalities of gait and mobility: Secondary | ICD-10-CM

## 2020-10-18 DIAGNOSIS — R293 Abnormal posture: Secondary | ICD-10-CM | POA: Diagnosis not present

## 2020-10-18 DIAGNOSIS — R2681 Unsteadiness on feet: Secondary | ICD-10-CM

## 2020-10-18 DIAGNOSIS — I10 Essential (primary) hypertension: Secondary | ICD-10-CM | POA: Diagnosis not present

## 2020-10-18 DIAGNOSIS — I739 Peripheral vascular disease, unspecified: Secondary | ICD-10-CM | POA: Diagnosis not present

## 2020-10-18 DIAGNOSIS — M6281 Muscle weakness (generalized): Secondary | ICD-10-CM

## 2020-10-18 DIAGNOSIS — I38 Endocarditis, valve unspecified: Secondary | ICD-10-CM | POA: Diagnosis not present

## 2020-10-18 DIAGNOSIS — I5020 Unspecified systolic (congestive) heart failure: Secondary | ICD-10-CM | POA: Diagnosis not present

## 2020-10-18 DIAGNOSIS — M25661 Stiffness of right knee, not elsewhere classified: Secondary | ICD-10-CM

## 2020-10-18 DIAGNOSIS — I420 Dilated cardiomyopathy: Secondary | ICD-10-CM | POA: Diagnosis not present

## 2020-10-18 DIAGNOSIS — M109 Gout, unspecified: Secondary | ICD-10-CM | POA: Diagnosis not present

## 2020-10-18 DIAGNOSIS — E785 Hyperlipidemia, unspecified: Secondary | ICD-10-CM | POA: Diagnosis not present

## 2020-10-18 NOTE — Therapy (Signed)
[facility] [address] [place], [place], [address] Phone: [phone]   Fax:  [phone]  Physical Therapy Treatment  Patient Details  Name: Timothy Faulkner MRN: [id number] Date of Birth: 1945-11-21 Referring Provider (PT): Monica Martinez, MD   Encounter Date: 10/18/2020   PT End of Session - 10/18/20 1513    Visit Number 7    Number of Visits 25    Date for PT Re-Evaluation 12/24/20    Authorization Type UHC Medicare    PT Start Time 1430    PT Stop Time 1515    PT Time Calculation (min) 45 min    Equipment Utilized During Treatment Gait belt    Activity Tolerance Patient tolerated treatment well;Patient limited by fatigue    Behavior During Therapy [facility] for tasks assessed/performed           Past Medical History:  Diagnosis Date  . Anemia   . Arthritis    Gout  . Automatic implantable cardioverter-defibrillator in situ    [employer]  . CHF   . ESRD on dialysis [facility])    [name] 03/11/2013 (03/11/2013) dialysis M/W/F  . Gangrene ([place])    left foot  . GERD (gastroesophageal reflux disease)   . Gout    "once a year"  . Heart murmur   . HYPERCHOLESTEROLEMIA, MIXED   . HYPERTENSION   . Macular degeneration   . Osteomyelitis ([place])    right foot  . 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 Pacemaker   . Peripheral arterial disease (HCC)    left fifth toe ulcer, healing  . Pneumonia   . Shortness of breath   . 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 Wears dentures   . Wears glasses     Past Surgical History:  Procedure Laterality Date  . A/V FISTULAGRAM Left 07/20/2018   Procedure: A/V FISTULAGRAM;  Surgeon: [name], MD;  Location: [place] [facility];  Service: Cardiovascular;  Laterality: Left;  . A/V FISTULAGRAM N/A 07/15/2019   Procedure: A/V FISTULAGRAM - Left Arm;  Surgeon: Elam Dutch, MD;  Location: [place] [facility];  Service: Cardiovascular;   Laterality: N/A;  . ABDOMINAL AORTOGRAM N/A 02/25/2019   Procedure: ABDOMINAL AORTOGRAM;  Surgeon: Elam Dutch, MD;  Location: [place] [facility];  Service: Cardiovascular;  Laterality: N/A;  . ABDOMINAL AORTOGRAM W/LOWER EXTREMITY Bilateral 07/20/2018   Procedure: ABDOMINAL AORTOGRAM W/LOWER EXTREMITY;  Surgeon: [name], MD;  Location: [place] [facility];  Service: Cardiovascular;  Laterality: Bilateral;  . AMPUTATION Right 09/07/2018   Procedure: Right fifth metatarsectomy;  Surgeon: [name], DPM;  Location: [place];  Service: Podiatry;  Laterality: Right;  . AMPUTATION Left 03/08/2019   Procedure: AMPUTATION  3RD AND 4TH TOES LEFT FOOT;  Surgeon: [name], DPM;  Location: [place];  Service: Podiatry;  Laterality: Left;  . AMPUTATION Left 03/15/2019   Procedure: AMPUTATION BELOW KNEE;  Surgeon: Elam Dutch, MD;  Location: [facility] OR;  Service: Vascular;  Laterality: Left;  . AV FISTULA PLACEMENT Right 12/13/2012   Procedure: ARTERIOVENOUS (AV) FISTULA CREATION;  Surgeon: [name], MD;  Location: [place];  Service: Vascular;  Laterality: Right;  Ultrasound guided  . AV FISTULA PLACEMENT Left 05/07/2016   Procedure: LEFT RADIOCEPHALIC ARTERIOVENOUS (AV) FISTULA CREATION;  Surgeon: [name], MD;  Location: [place];  Service: Vascular;  Laterality: Left;  . AV FISTULA PLACEMENT  Right 08/09/2019   Procedure: INSERTION OF RIGHT ARTERIOVENOUS (AV) 4-76mm GORE-TEX STRETCH GRAFT RIGHT  ARM;  Surgeon: Elam Dutch, MD;  Location: [place];  Service: Vascular;  Laterality: Right;  . BASCILIC VEIN TRANSPOSITION Right 03/26/2016   Procedure: RIGHT BASILIC VEIN TRANSPOSITION;  Surgeon: [name], MD;  Location: [place];  Service: Vascular;  Laterality: Right;  . CARDIAC CATHETERIZATION    . CARDIAC DEFIBRILLATOR PLACEMENT     Chemical engineer  . CATARACT EXTRACTION W/PHACO Bilateral   . EYE SURGERY Bilateral    Cataract  . FISTULOGRAM Left 04/22/2018   Procedure: FISTULOGRAM  UPPER EXTREMITY;  Surgeon: [name], MD;  Location: [place];  Service: Vascular;  Laterality: Left;  . I & D EXTREMITY Right 07/15/2018   Procedure: IRRIGATION AND DEBRIDEMENT RIGHT FOOT;  Surgeon: [name], DPM;  Location: [place];  Service: Podiatry;  Laterality: Right;  . INCISION AND DRAINAGE ABSCESS / HEMATOMA OF BURSA / KNEE / THIGH Left 2012   "knee" (03/11/2013)  . INSERT / REPLACE / REMOVE PACEMAKER    . INSERTION OF DIALYSIS CATHETER Left 04/22/2018   Procedure: INSERTION OF DIALYSIS CATHETER;  Surgeon: [name], MD;  Location: [place];  Service: Vascular;  Laterality: Left;  . IR FLUORO GUIDE CV LINE LEFT  07/15/2018  . IR PTA VENOUS EXCEPT DIALYSIS CIRCUIT  07/15/2018  . LOWER EXTREMITY ANGIOGRAPHY Right 07/21/2018   Procedure: LOWER EXTREMITY ANGIOGRAPHY;  Surgeon: [name], MD;  Location: [place] [facility];  Service: Cardiovascular;  Laterality: Right;  . LOWER EXTREMITY ANGIOGRAPHY Bilateral 02/25/2019   Procedure: Lower Extremity Angiography;  Surgeon: Elam Dutch, MD;  Location: [place] [facility];  Service: Cardiovascular;  Laterality: Bilateral;  . METATARSAL HEAD EXCISION Right 07/15/2018   Procedure: METATARSAL RESECTION;  Surgeon: [name], DPM;  Location: [place];  Service: Podiatry;  Laterality: Right;  . MULTIPLE TOOTH EXTRACTIONS    . PERIPHERAL VASCULAR ATHERECTOMY Right 02/28/2019   Procedure: PERIPHERAL VASCULAR ATHERECTOMY;  Surgeon: Waynetta Sandy, MD;  Location: [place] [facility];  Service: Cardiovascular;  Laterality: Right;  right tp trunk   . PERIPHERAL VASCULAR BALLOON ANGIOPLASTY Left 02/25/2019   Procedure: PERIPHERAL VASCULAR BALLOON ANGIOPLASTY;  Surgeon: Elam Dutch, MD;  Location: [place] [facility];  Service: Cardiovascular;  Laterality: Left;  tibial peroneal trunk and PT  . PERIPHERAL VASCULAR INTERVENTION Right 07/21/2018   Procedure: PERIPHERAL VASCULAR INTERVENTION;  Surgeon: [name], MD;  Location: [place] [facility];  Service: Cardiovascular;  Laterality: Right;  peroneal stents  . REVISON OF ARTERIOVENOUS FISTULA Right 5/80/9983   Procedure: Plication OF Right Arm ARTERIOVENOUS FISTULA;  Surgeon: Elam Dutch, MD;  Location[facility];  Service: Vascular;  Laterality: Right;  . REVISON OF ARTERIOVENOUS FISTULA Left 04/22/2018   Procedure: REVISION OF RADIOCEPHALIC ARTERIOVENOUS FISTULA;  Surgeon: [name], MD;  Location: [place];  Service: Vascular;  Laterality: Left;  . SHUNTOGRAM N/A 05/31/2013   Procedure: [place];  Surgeon: [name], MD;  Location: [facility] CATH LAB;  Service: Cardiovascular;  Laterality: N/A;  . UPPER EXTREMITY VENOGRAPHY Right 07/23/2018   Procedure: UPPER EXTREMITY VENOGRAPHY;  Surgeon: [name], MD;  Location: [place] [facility];  Service: Cardiovascular;  Laterality: Right;  . UPPER EXTREMITY VENOGRAPHY  07/15/2019   Procedure: UPPER EXTREMITY VENOGRAPHY;  Surgeon: Elam Dutch, MD;  Location: [place] [facility];  Service: Cardiovascular;;  rt arm   .  WOUND DEBRIDEMENT Right 07/17/2018   Procedure: Wound Debridement; Closure Filleted toe flap Right Foot;  Surgeon: [name], DPM;  Location: [place];  Service: Podiatry;  Laterality: Right;  . WOUND DEBRIDEMENT Right 09/07/2018   Procedure: Debridement Right Foot Wound, application of wound vac;  Surgeon: [name], DPM;  Location: [place];  Service: Podiatry;  Laterality: Right;  . WOUND DEBRIDEMENT Right 03/08/2019   Procedure: DEBRIDEMENT WOUND RIGHT FOOT;  Surgeon: [name], DPM;  Location: [place];  Service: Podiatry;  Laterality: Right;    There were no vitals filed for this visit.   Subjective Assessment - 10/18/20 1503    Subjective he relays no issues or complications with his prosthesis or residual limb. He does arrive in soft walking boot from poditrist visit yesterday and patient relays he had to have 2 toenails removed from Rt  foot.    Pertinent History PAD, DM2, arthritis, gout, CHF, implanted cardioverter-defib, HTN, CKD w/ ESRD    Patient Stated Goals to use prosthesis to walk in community,             [facility] Adult PT Treatment/Exercise - 10/18/20 0001      Transfers   Transfers Sit to Stand;Stand to Sit;Squat Pivot Transfers    Sit to Stand 5: Supervision;With upper extremity assist;With armrests;From chair/3-in-1;Other (comment)    Stand to Sit 5: Supervision;With upper extremity assist;With armrests;To chair/3-in-1;Other (comment)      Ambulation/Gait   Ambulation/Gait Yes    Ambulation/Gait Assistance 4: Min guard    Ambulation Distance (Feet) 62 Feet    Assistive device Rolling walker;Prosthesis      Therapeutic Activites    Other Therapeutic Activities upright posture without UE support sitting on 24" stool, then progressed to rows and chest press with red band  X 20 ea. Walking in bars with bilat UE support fwd up, retro back X 3 trips, then lateral walking X 3 trips with bilat UE support      Neuro Re-ed    Neuro Re-ed Details  standing with feet hip width apart in bars, progressed to head turns X 10 reps, head nods  X10 reps, fwd arm reaches X 10 reps, eyes closed 10 sec X 5 reps.      Exercises   Other Exercises  sit to stand from barstool without UE push up 10 reps with min guard bars. Standing hip flexion and abd with bilat UE support X 10 bilat. Seated LAQ 2X10 bilat with 5#      Knee/Hip Exercises: Aerobic   Nustep seat 11 L5 for 6 min                    PT Short Term Goals - 09/25/20 2128      PT SHORT TERM GOAL #1   Title Patient donnes prosthesis with verbal cues only.    Time 4    Period Weeks    Status New    Target Date 10/25/20      PT SHORT TERM GOAL #2   Title Patient tolerates prosthesis wear >8hrs total on nondialysis days.    Time 4    Period Weeks    Status New    Target Date 10/25/20      PT SHORT TERM GOAL #3   Title Patient standing balance with  RW & prosthesis reaching 7" anteriorly & within 10" of floor with supervision.    Time 4    Period Weeks    Status  New    Target Date 10/25/20      PT SHORT TERM GOAL #4   Title Patient ambulates 75" with RW & prosthesis with minA.    Time 4    Period Weeks    Status New    Target Date 10/25/20             PT Long Term Goals - 09/25/20 2123      PT LONG TERM GOAL #1   Title Patient & family verbalize & demonstrate understanding of proper prosthetic care to enable utilization of prosthesis.    Time 12    Period Weeks    Status New    Target Date 12/20/20      PT LONG TERM GOAL #2   Title Patient tolerates wear of prosthesis >90% of awake hours without skin or limb discomfort issues.    Time 12    Period Weeks    Status New    Target Date 12/20/20      PT LONG TERM GOAL #3   Title [place] Balance >/= 36/56    Time 12    Period Weeks    Status New    Target Date 12/20/20      PT LONG TERM GOAL #4   Title Patient ambulates 200' with RW & prosthesis modified independent.    Time 12    Period Weeks    Status New    Target Date 12/20/20      PT LONG TERM GOAL #5   Title Patient negotiates ramps & curbs with RW & prosthesis with minA.    Time 12    Period Weeks    Status New    Target Date 12/20/20                 Plan - 10/18/20 1515    Clinical Impression Statement Continued to work on overall gait, balance, leg strength and endurance. He shows good effort with PT and progressing as expected. He had no complaints during session.Continue POC    Personal Factors and Comorbidities Comorbidity 3+;Fitness;Time since onset of injury/illness/exacerbation    Comorbidities PAD, DM2, arthritis, gout, CHF, implanted cardioverter-defib, HTN, CKD w/ ESRD, blind left knee    Examination-Activity Limitations Locomotion Level;Stairs;Stand;Transfers    Examination-Participation Restrictions Community Activity    Stability/Clinical Decision Making Evolving/Moderate  complexity    Rehab Potential Good    PT Frequency 2x / week    PT Duration 12 weeks    PT Treatment/Interventions ADLs/Self Care Home Management;DME Instruction;Gait training;Stair training;Functional mobility training;Therapeutic activities;Therapeutic exercise;Balance training;Neuromuscular re-education;Patient/family education;Prosthetic Training;Manual techniques;Vestibular    PT Next Visit Plan review prosthetic care,  prosthetic gait with RW including ramps & curbs,  exercise for strength, balance, flexibility & endurance.    Consulted and Agree with Plan of Care Patient;Family member/caregiver    Family Member Consulted daughter,Sherry Cira Servant           Patient will benefit from skilled therapeutic intervention in order to improve the following deficits and impairments:  Abnormal gait,Cardiopulmonary status limiting activity,Decreased activity tolerance,Decreased balance,Decreased endurance,Decreased knowledge of use of DME,Decreased mobility,Decreased range of motion,Decreased scar mobility,Decreased strength,Increased edema,Postural dysfunction,Prosthetic Dependency  Visit Diagnosis: Unsteadiness on feet  Other abnormalities of gait and mobility  Muscle weakness (generalized)  Stiffness of right knee, not elsewhere classified  Abnormal posture     Problem List Patient Active Problem List   Diagnosis Date Noted  . Polycythemia, secondary 02/21/2020  . Severe nonproliferative diabetic retinopathy of right eye without  macular edema associated with type 2 diabetes mellitus ([place]) 11/29/2019  . Severe nonproliferative diabetic retinopathy of left eye without macular edema associated with type 2 diabetes mellitus ([place]) 11/29/2019  . Advanced nonexudative age-related macular degeneration of both eyes with subfoveal involvement 11/29/2019  . 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  . Sepsis ([place]) 04/14/2019  . Hypocalcemia 04/08/2019  . Drug induced constipation    . Anemia of chronic disease   . ESRD on dialysis ([place])   . Postoperative pain   . Phantom limb pain ([place])   . S/P BKA (below knee amputation), left ([place]) 03/18/2019  . Unilateral complete BKA, left, initial encounter ([place]) 03/17/2019  . Critical lower limb ischemia ([place]) 03/12/2019  . Infection of amputation stump, right lower extremity ([place]) 03/10/2019  . Hypotension 02/23/2019  . Cellulitis of left foot 02/23/2019  . Gangrene of toe of left foot ([place]) 02/22/2019  . Complication of vascular dialysis catheter 11/24/2018  . Chronic osteomyelitis involving right ankle and foot ([place])   . Ulcer of right foot with fat layer exposed ([place]) 09/02/2018  . Encounter for planned post-operative wound closure   . Acute osteomyelitis of metatarsal bone of right foot ([place])   . Diabetic ulcer of midfoot associated with diabetes mellitus due to underlying condition, with necrosis of bone ([place])   . Osteomyelitis of right foot ([place])   . Vascular calcification   . Cellulitis and abscess of foot, except toes   . Anemia associated with chronic renal failure 07/13/2018  . Cardiomyopathy, dilated, nonischemic ([place]) 07/13/2018  . Diabetic foot infection ([place]) 07/13/2018  . PAD (peripheral artery disease) ([place])   . Macular degeneration   . Ventricular tachycardia ([place]) 06/24/2017  . Embolism due to vascular prosthetic devices, implants and grafts, initial encounter ([place]) 02/04/2016  . Dependence on renal dialysis ([place]) 05/30/2015  . Shortness of breath 02/05/2015  . Chronic systolic heart failure ([place]) 08/22/2014  . Diabetic infection of right foot ([place]) 07/13/2014  . Type 2 diabetes mellitus with diabetic peripheral angiopathy without gangrene ([place]) 03/18/2014  . Anxiety disorder, unspecified 02/23/2014  . Fever, unspecified 02/23/2014  . Headache 02/23/2014  . Pain, unspecified 02/23/2014  . Pruritus, unspecified 02/23/2014  . Mild protein-calorie malnutrition ([place]) 12/26/2013  . ESRD (end stage  renal disease) on dialysis ([place]) 11/11/2013  . Snoring 11/11/2013  . Unspecified sleep apnea 11/11/2013  . Other pancytopenia ([place]) 09/22/2013  . Hemoptysis 09/20/2013  . Personal history of nicotine dependence 07/12/2013  . Coagulation defect, unspecified ([place]) 05/16/2013  . Iron deficiency anemia, unspecified 05/06/2013  . Other disorders of plasma-protein metabolism, not elsewhere classified 04/15/2013  . 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 chronic kidney disease or end stage renal disease ([place]) 03/18/2013  . Other cardiomyopathies ([place]) 03/18/2013  . Secondary hyperparathyroidism of renal origin ([place]) 03/18/2013  . Type 2 diabetes mellitus without complications ([place]) 02/01/7627  . Pre-transplant evaluation for kidney transplant 01/11/2013  . Automatic implantable cardioverter-defibrillator in situ 10/01/2010  . HYPERCHOLESTEROLEMIA, MIXED 05/03/2010  . Essential hypertension 05/03/2010  . CHF 05/03/2010    Silvestre Mesi 10/18/2020, 3:22 PM  [facility] [address] [place], [place], [address] Phone: [phone]   Fax:  [phone]  Name: Timothy Faulkner MRN: [id number] Date of Birth: [birth year]

## 2020-10-19 DIAGNOSIS — N186 End stage renal disease: Secondary | ICD-10-CM | POA: Diagnosis not present

## 2020-10-19 DIAGNOSIS — Z992 Dependence on renal dialysis: Secondary | ICD-10-CM | POA: Diagnosis not present

## 2020-10-19 DIAGNOSIS — N2581 Secondary hyperparathyroidism of renal origin: Secondary | ICD-10-CM | POA: Diagnosis not present

## 2020-10-19 DIAGNOSIS — D689 Coagulation defect, unspecified: Secondary | ICD-10-CM | POA: Diagnosis not present

## 2020-10-22 ENCOUNTER — Emergency Department (HOSPITAL_COMMUNITY)
Admission: EM | Admit: 2020-10-22 | Discharge: 2020-10-22 | Disposition: A | Discharge status: Home / Self Care | Payer: Medicare Other | Attending: Emergency Medicine | Admitting: Emergency Medicine

## 2020-10-22 ENCOUNTER — Ambulatory Visit (INDEPENDENT_AMBULATORY_CARE_PROVIDER_SITE_OTHER): Payer: Medicare Other

## 2020-10-22 ENCOUNTER — Encounter (HOSPITAL_COMMUNITY): Payer: Self-pay | Admitting: *Deleted

## 2020-10-22 ENCOUNTER — Other Ambulatory Visit: Payer: Self-pay

## 2020-10-22 DIAGNOSIS — I5022 Chronic systolic (congestive) heart failure: Secondary | ICD-10-CM | POA: Diagnosis not present

## 2020-10-22 DIAGNOSIS — Z7902 Long term (current) use of antithrombotics/antiplatelets: Secondary | ICD-10-CM | POA: Diagnosis not present

## 2020-10-22 DIAGNOSIS — E1122 Type 2 diabetes mellitus with diabetic chronic kidney disease: Secondary | ICD-10-CM | POA: Diagnosis not present

## 2020-10-22 DIAGNOSIS — Z992 Dependence on renal dialysis: Secondary | ICD-10-CM | POA: Insufficient documentation

## 2020-10-22 DIAGNOSIS — E119 Type 2 diabetes mellitus without complications: Secondary | ICD-10-CM | POA: Diagnosis not present

## 2020-10-22 DIAGNOSIS — Z950 Presence of cardiac pacemaker: Secondary | ICD-10-CM | POA: Diagnosis not present

## 2020-10-22 DIAGNOSIS — Y828 Other medical devices associated with adverse incidents: Secondary | ICD-10-CM | POA: Insufficient documentation

## 2020-10-22 DIAGNOSIS — R69 Illness, unspecified: Secondary | ICD-10-CM | POA: Insufficient documentation

## 2020-10-22 DIAGNOSIS — N186 End stage renal disease: Secondary | ICD-10-CM | POA: Insufficient documentation

## 2020-10-22 DIAGNOSIS — Z87891 Personal history of nicotine dependence: Secondary | ICD-10-CM | POA: Insufficient documentation

## 2020-10-22 DIAGNOSIS — I420 Dilated cardiomyopathy: Secondary | ICD-10-CM | POA: Diagnosis not present

## 2020-10-22 DIAGNOSIS — E11319 Type 2 diabetes mellitus with unspecified diabetic retinopathy without macular edema: Secondary | ICD-10-CM | POA: Diagnosis not present

## 2020-10-22 DIAGNOSIS — Z79899 Other long term (current) drug therapy: Secondary | ICD-10-CM | POA: Diagnosis not present

## 2020-10-22 DIAGNOSIS — I132 Hypertensive heart and chronic kidney disease with heart failure and with stage 5 chronic kidney disease, or end stage renal disease: Secondary | ICD-10-CM | POA: Diagnosis not present

## 2020-10-22 DIAGNOSIS — D689 Coagulation defect, unspecified: Secondary | ICD-10-CM | POA: Diagnosis not present

## 2020-10-22 DIAGNOSIS — Z743 Need for continuous supervision: Secondary | ICD-10-CM | POA: Diagnosis not present

## 2020-10-22 DIAGNOSIS — T82838A Hemorrhage of vascular prosthetic devices, implants and grafts, initial encounter: Secondary | ICD-10-CM | POA: Diagnosis not present

## 2020-10-22 DIAGNOSIS — R52 Pain, unspecified: Secondary | ICD-10-CM | POA: Diagnosis not present

## 2020-10-22 DIAGNOSIS — N2581 Secondary hyperparathyroidism of renal origin: Secondary | ICD-10-CM | POA: Diagnosis not present

## 2020-10-22 DIAGNOSIS — R202 Paresthesia of skin: Secondary | ICD-10-CM | POA: Diagnosis not present

## 2020-10-22 LAB — HEMOGLOBIN AND HEMATOCRIT, BLOOD
HCT: 44.7 % (ref 39.0–52.0)
Hemoglobin: 14.6 g/dL (ref 13.0–17.0)

## 2020-10-22 NOTE — ED Triage Notes (Signed)
Pt had HD today and reports that after full HD treatment he went home where he noticed blood running down his arm from his fistula.  FD applied pressure dressing and was able to control bleeding.  Total blood loss is estimated by EMS to be less than 1pt.

## 2020-10-22 NOTE — ED Provider Notes (Signed)
[facility] EMERGENCY DEPARTMENT Provider Note   CSN: [id number] Arrival date & time: 10/22/20  1551     History Chief Complaint  Patient presents with  . Bleeding/Bruising    fistula    Timothy Faulkner is a 75 y.o. male.  75 year old male with prior medical history detailed below presents for evaluation.  Patient reports that he completed his normal dialysis session this morning.  His right arm graft had been accessed.  After dialysis his graft was deaccessed and a dressing was applied.  He felt fine until he came home.  At this point he noticed some bleeding from underneath the dressing on his right arm.  EMS was called to the scene.  They applied a pressure dressing over the right arm graft.  Upon arrival to the ED the patient is without active bleeding.  He has no other complaint.  EMS reports perhaps 4 to 500 cc of blood loss on scene.  Patient denies fever.  He denies shortness of breath.  He denies pain.  The history is provided by the patient, medical records and the EMS personnel.  Illness Location:  Bleeding after dialysis from access site Severity:  Mild Onset quality:  Sudden Duration:  1 hour Timing:  Unable to specify Progression:  Unable to specify Chronicity:  New      Past Medical History:  Diagnosis Date  . Anemia   . Arthritis    Gout  . Automatic implantable cardioverter-defibrillator in situ    [employer]  . CHF   . ESRD on dialysis [facility])    [name] 03/11/2013 (03/11/2013) dialysis M/W/F  . Gangrene ([place])    left foot  . GERD (gastroesophageal reflux disease)   . Gout    "once a year"  . Heart murmur   . HYPERCHOLESTEROLEMIA, MIXED   . HYPERTENSION   . Macular degeneration   . Osteomyelitis ([place])    right foot  . 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 Pacemaker   . Peripheral arterial disease (HCC)    left fifth toe ulcer, healing  . Pneumonia   . Shortness of breath   . Type 2 diabetes mellitus with left  diabetic foot ulcer (HCC)    left fifth toe  . Wears dentures   . Wears glasses     Patient Active Problem List   Diagnosis Date Noted  . Polycythemia, secondary 02/21/2020  . Severe nonproliferative diabetic retinopathy of right eye without macular edema associated with type 2 diabetes mellitus ([place]) 11/29/2019  . Severe nonproliferative diabetic retinopathy of left eye without macular edema associated with type 2 diabetes mellitus ([place]) 11/29/2019  . Advanced nonexudative age-related macular degeneration of both eyes with subfoveal involvement 11/29/2019  . 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  . Sepsis ([place]) 04/14/2019  . Hypocalcemia 04/08/2019  . Drug induced constipation   . Anemia of chronic disease   . ESRD on dialysis ([place])   . Postoperative pain   . Phantom limb pain ([place])   . S/P BKA (below knee amputation), left ([place]) 03/18/2019  . Unilateral complete BKA, left, initial encounter ([place]) 03/17/2019  . Critical lower limb ischemia ([place]) 03/12/2019  . Infection of amputation stump, right lower extremity ([place]) 03/10/2019  . Hypotension 02/23/2019  . Cellulitis of left foot 02/23/2019  . Gangrene of toe of left foot ([place]) 02/22/2019  . Complication of vascular dialysis catheter 11/24/2018  . Chronic osteomyelitis involving right ankle and foot ([place])   . Ulcer of right foot  with fat layer exposed ([place]) 09/02/2018  . Encounter for planned post-operative wound closure   . Acute osteomyelitis of metatarsal bone of right foot ([place])   . Diabetic ulcer of midfoot associated with diabetes mellitus due to underlying condition, with necrosis of bone ([place])   . Osteomyelitis of right foot ([place])   . Vascular calcification   . Cellulitis and abscess of foot, except toes   . Anemia associated with chronic renal failure 07/13/2018  . Cardiomyopathy, dilated, nonischemic ([place]) 07/13/2018  . Diabetic foot infection ([place]) 07/13/2018  . PAD (peripheral artery disease) ([place])    . Macular degeneration   . Ventricular tachycardia ([place]) 06/24/2017  . Embolism due to vascular prosthetic devices, implants and grafts, initial encounter ([place]) 02/04/2016  . Dependence on renal dialysis ([place]) 05/30/2015  . Shortness of breath 02/05/2015  . Chronic systolic heart failure ([place]) 08/22/2014  . Diabetic infection of right foot ([place]) 07/13/2014  . Type 2 diabetes mellitus with diabetic peripheral angiopathy without gangrene ([place]) 03/18/2014  . Anxiety disorder, unspecified 02/23/2014  . Fever, unspecified 02/23/2014  . Headache 02/23/2014  . Pain, unspecified 02/23/2014  . Pruritus, unspecified 02/23/2014  . Mild protein-calorie malnutrition ([place]) 12/26/2013  . ESRD (end stage renal disease) on dialysis ([place]) 11/11/2013  . Snoring 11/11/2013  . Unspecified sleep apnea 11/11/2013  . Other pancytopenia ([place]) 09/22/2013  . Hemoptysis 09/20/2013  . Personal history of nicotine dependence 07/12/2013  . Coagulation defect, unspecified ([place]) 05/16/2013  . Iron deficiency anemia, unspecified 05/06/2013  . Other disorders of plasma-protein metabolism, not elsewhere classified 04/15/2013  . 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 chronic kidney disease or end stage renal disease ([place]) 03/18/2013  . Other cardiomyopathies ([place]) 03/18/2013  . Secondary hyperparathyroidism of renal origin ([place]) 03/18/2013  . Type 2 diabetes mellitus without complications ([place]) 47/82/9562  . Pre-transplant evaluation for kidney transplant 01/11/2013  . Automatic implantable cardioverter-defibrillator in situ 10/01/2010  . HYPERCHOLESTEROLEMIA, MIXED 05/03/2010  . Essential hypertension 05/03/2010  . CHF 05/03/2010    Past Surgical History:  Procedure Laterality Date  . A/V FISTULAGRAM Left 07/20/2018   Procedure: A/V FISTULAGRAM;  Surgeon: [name], MD;  Location: [facility] CV LAB;  Service: Cardiovascular;  Laterality: Left;  . A/V FISTULAGRAM N/A 07/15/2019   Procedure: A/V  FISTULAGRAM - Left Arm;  Surgeon: Elam Dutch, MD;  Location: [place] CV LAB;  Service: Cardiovascular;  Laterality: N/A;  . ABDOMINAL AORTOGRAM N/A 02/25/2019   Procedure: ABDOMINAL AORTOGRAM;  Surgeon: Elam Dutch, MD;  Location: [place] CV LAB;  Service: Cardiovascular;  Laterality: N/A;  . ABDOMINAL AORTOGRAM W/LOWER EXTREMITY Bilateral 07/20/2018   Procedure: ABDOMINAL AORTOGRAM W/LOWER EXTREMITY;  Surgeon: [name], MD;  Location: [facility] CV LAB;  Service: Cardiovascular;  Laterality: Bilateral;  . AMPUTATION Right 09/07/2018   Procedure: Right fifth metatarsectomy;  Surgeon: [name], DPM;  Location: [facility];  Service: Podiatry;  Laterality: Right;  . AMPUTATION Left 03/08/2019   Procedure: AMPUTATION  3RD AND 4TH TOES LEFT FOOT;  Surgeon: [name], DPM;  Location: [facility];  Service: Podiatry;  Laterality: Left;  . AMPUTATION Left 03/15/2019   Procedure: AMPUTATION BELOW KNEE;  Surgeon: Elam Dutch, MD;  Location: [facility] OR;  Service: Vascular;  Laterality: Left;  . AV FISTULA PLACEMENT Right 12/13/2012   Procedure: ARTERIOVENOUS (AV) FISTULA CREATION;  Surgeon: [name], MD;  Location: [facility];  Service: Vascular;  Laterality: Right;  Ultrasound guided  . AV FISTULA  PLACEMENT Left 05/07/2016   Procedure: LEFT RADIOCEPHALIC ARTERIOVENOUS (AV) FISTULA CREATION;  Surgeon: [name], MD;  Location[facility];  Service: Vascular;  Laterality: Left;  . AV FISTULA PLACEMENT Right 08/09/2019   Procedure: INSERTION OF RIGHT ARTERIOVENOUS (AV) 4-49mm GORE-TEX STRETCH GRAFT RIGHT  ARM;  Surgeon: Elam Dutch, MD;  Location: [facility] [place];  Service: Vascular;  Laterality: Right;  . BASCILIC VEIN TRANSPOSITION Right 03/26/2016   Procedure: RIGHT BASILIC VEIN TRANSPOSITION;  Surgeon: [name], MD;  Location: [facility];  Service: Vascular;  Laterality: Right;  . CARDIAC CATHETERIZATION    . CARDIAC DEFIBRILLATOR PLACEMENT     Chemical engineer  . CATARACT EXTRACTION  W/PHACO Bilateral   . EYE SURGERY Bilateral    Cataract  . FISTULOGRAM Left 04/22/2018   Procedure: FISTULOGRAM UPPER EXTREMITY;  Surgeon: [name], MD;  Location: [facility];  Service: Vascular;  Laterality: Left;  . I & D EXTREMITY Right 07/15/2018   Procedure: IRRIGATION AND DEBRIDEMENT RIGHT FOOT;  Surgeon: [name], DPM;  Location: [facility];  Service: Podiatry;  Laterality: Right;  . INCISION AND DRAINAGE ABSCESS / HEMATOMA OF BURSA / KNEE / THIGH Left 2012   "knee" (03/11/2013)  . INSERT / REPLACE / REMOVE PACEMAKER    . INSERTION OF DIALYSIS CATHETER Left 04/22/2018   Procedure: INSERTION OF DIALYSIS CATHETER;  Surgeon: [name], MD;  Location: [facility];  Service: Vascular;  Laterality: Left;  . IR FLUORO GUIDE CV LINE LEFT  07/15/2018  . IR PTA VENOUS EXCEPT DIALYSIS CIRCUIT  07/15/2018  . LOWER EXTREMITY ANGIOGRAPHY Right 07/21/2018   Procedure: LOWER EXTREMITY ANGIOGRAPHY;  Surgeon: [name], MD;  Location: [facility] LAB;  Service: Cardiovascular;  Laterality: Right;  . LOWER EXTREMITY ANGIOGRAPHY Bilateral 02/25/2019   Procedure: Lower Extremity Angiography;  Surgeon: Elam Dutch, MD;  Location: [place] [facility];  Service: Cardiovascular;  Laterality: Bilateral;  . METATARSAL HEAD EXCISION Right 07/15/2018   Procedure: METATARSAL RESECTION;  Surgeon: [name], DPM;  Location: [facility];  Service: Podiatry;  Laterality: Right;  . MULTIPLE TOOTH EXTRACTIONS    . PERIPHERAL VASCULAR ATHERECTOMY Right 02/28/2019   Procedure: PERIPHERAL VASCULAR ATHERECTOMY;  Surgeon: Waynetta Sandy, MD;  Location: [facility] LAB;  Service: Cardiovascular;  Laterality: Right;  right tp trunk   . PERIPHERAL VASCULAR BALLOON ANGIOPLASTY Left 02/25/2019   Procedure: PERIPHERAL VASCULAR BALLOON ANGIOPLASTY;  Surgeon: Elam Dutch, MD;  Location: [facility] LAB;  Service: Cardiovascular;  Laterality: Left;  tibial peroneal trunk and PT  .  PERIPHERAL VASCULAR INTERVENTION Right 07/21/2018   Procedure: PERIPHERAL VASCULAR INTERVENTION;  Surgeon: [name], MD;  Location: [facility];  Service: Cardiovascular;  Laterality: Right;  peroneal stents  . REVISON OF ARTERIOVENOUS FISTULA Right 0/99/8338   Procedure: Plication OF Right Arm ARTERIOVENOUS FISTULA;  Surgeon: Elam Dutch, MD;  Location[facility] OR;  Service: Vascular;  Laterality: Right;  . REVISON OF ARTERIOVENOUS FISTULA Left 04/22/2018   Procedure: REVISION OF RADIOCEPHALIC ARTERIOVENOUS FISTULA;  Surgeon: [name], MD;  Location: [facility];  Service: Vascular;  Laterality: Left;  . SHUNTOGRAM N/A 05/31/2013   Procedure: Earney Mallet;  Surgeon: [name], MD;  Location: [facility] CATH LAB;  Service: Cardiovascular;  Laterality: N/A;  . UPPER EXTREMITY VENOGRAPHY Right 07/23/2018   Procedure: UPPER EXTREMITY VENOGRAPHY;  Surgeon: [name], MD;  Location: [facility];  Service: Cardiovascular;  Laterality: Right;  .  UPPER EXTREMITY VENOGRAPHY  07/15/2019   Procedure: UPPER EXTREMITY VENOGRAPHY;  Surgeon: Elam Dutch, MD;  Location: [facility];  Service: Cardiovascular;;  rt arm   . WOUND DEBRIDEMENT Right 07/17/2018   Procedure: Wound Debridement; Closure Filleted toe flap Right Foot;  Surgeon: [name], DPM;  Location: [facility];  Service: Podiatry;  Laterality: Right;  . WOUND DEBRIDEMENT Right 09/07/2018   Procedure: Debridement Right Foot Wound, application of wound vac;  Surgeon: [name], DPM;  Location: [facility];  Service: Podiatry;  Laterality: Right;  . WOUND DEBRIDEMENT Right 03/08/2019   Procedure: DEBRIDEMENT WOUND RIGHT FOOT;  Surgeon: [name], DPM;  Location: [facility];  Service: Podiatry;  Laterality: Right;       Family History  Problem Relation Age of Onset  . Heart disease Father   . CAD Father     Social History   Tobacco Use  . Smoking status: Former Smoker    Packs/day: 0.75     Years: 30.00    Pack years: 22.50    Types: Cigarettes    Quit date: 11/25/1992    Years since quitting: 27.9  . Smokeless tobacco: Never Used  Vaping Use  . Vaping Use: Never used  Substance Use Topics  . Alcohol use: Not Currently  . Drug use: Not Currently    Types: Marijuana    Comment: last use 10 years ago    Home Medications Prior to Admission medications   Medication Sig Start Date End Date Taking? Authorizing Provider  acetaminophen (TYLENOL) 500 MG tablet Take 500 mg by mouth every 6 (six) hours as needed for mild pain or headache.     [provider]  Amino Acids-Protein Hydrolys (FEEDING SUPPLEMENT, PRO-STAT SUGAR FREE 64,) LIQD Take 30 mLs by mouth 2 (two) times daily. 03/03/19   Hongalgi, Lenis Dickinson, MD  AURYXIA 1 GM 210 MG(Fe) tablet Take 210 mg by mouth 3 (three) times daily.  06/08/19   [provider]  bisacodyl (DULCOLAX) 5 MG EC tablet Take 1 tablet (5 mg total) by mouth daily as needed for moderate constipation. 04/17/19   [place] Shi, MD  calcitRIOL (ROCALTROL) 0.25 MCG capsule Take 0.25 mcg by mouth every Monday, Wednesday, and Friday.    [provider]  Calcium Carbonate Antacid (CALCIUM CARBONATE, DOSED IN MG ELEMENTAL CALCIUM,) 1250 MG/5ML SUSP Take 5 mLs (500 mg of elemental calcium total) by mouth every 6 (six) hours as needed for indigestion. 03/30/19   Angiulli, Lavon Paganini, PA-C  cinacalcet (SENSIPAR) 30 MG tablet Take 1 tablet (30 mg total) by mouth every other day. 03/30/19   Angiulli, Lavon Paganini, PA-C  CLONIDINE HCL PO Take by mouth. 05/23/20 05/22/21  [provider]  clopidogrel (PLAVIX) 75 MG tablet Take 1 tablet (75 mg total) by mouth daily with breakfast. 03/30/19   Angiulli, Lavon Paganini, PA-C  colchicine 0.6 MG tablet Take 0.6 mg by mouth daily as needed (as directed for gout flares).  02/26/18   [provider]  collagenase (SANTYL) ointment Apply topically daily. Wound measures 0.5x0.5. Apply to wound - edge to  edge nickel thick.  Cover with wet gauze followed by dry gauze and change daily 05/15/20   [name], DPM  docusate sodium (COLACE) 100 MG capsule Take 2 capsules (200 mg total) by mouth 2 (two) times daily. 03/30/19   Angiulli, Lavon Paganini, PA-C  heparin 1000 unit/mL SOLN injection Heparin Sodium (Porcine) 1,000 Units/mL Systemic 04/13/20 04/12/21  [provider]  HYDROcodone-acetaminophen (NORCO) 5-325 MG tablet Take 1 tablet by mouth every 6 (six) hours as needed for moderate pain. 08/09/19   Dagoberto Ligas, PA-C  lidocaine-prilocaine (EMLA) cream SMARTSIG:Sparingly Topical 05/11/20   [provider]  methocarbamol (ROBAXIN) 500 MG tablet Take 1 tablet (500 mg total) by mouth every 12 (twelve) hours as needed for muscle spasms. 03/30/19   Angiulli, Lavon Paganini, PA-C  midodrine (PROAMATINE) 10 MG tablet Take 1 tablet (10 mg total) by mouth every Monday, Wednesday, and Friday with hemodialysis. 03/30/19   Angiulli, Lavon Paganini, PA-C  oxyCODONE (OXY IR/ROXICODONE) 5 MG immediate release tablet Take by mouth.  04/19/19   [provider]  RENVELA 800 MG tablet Take 2 tablets by mouth in the morning, at noon, and at bedtime. 02/15/20   [provider]  rosuvastatin (CRESTOR) 10 MG tablet Take 1 tablet (10 mg total) by mouth daily at 6 PM. 03/30/19   Angiulli, Lavon Paganini, PA-C  silver sulfADIAZINE (SILVADENE) 1 % cream Apply pea-sized amount to wound daily. 05/12/19   [name], DPM    Allergies    Patient has no known allergies.  Review of Systems   Review of Systems  All other systems reviewed and are negative.   Physical Exam Updated Vital Signs BP 106/71   Pulse 69   Temp 98.4 F (36.9 C) (Oral)   Resp 16   Wt 71.6 kg   SpO2 100%   BMI 24.00 kg/m   Physical Exam Vitals and nursing note reviewed.  Constitutional:      General: He is not in acute distress.    Appearance: He is well-developed.  HENT:     Head: Normocephalic and atraumatic.  Eyes:      Conjunctiva/sclera: Conjunctivae normal.     Pupils: Pupils are equal, round, and reactive to light.  Cardiovascular:     Rate and Rhythm: Normal rate and regular rhythm.     Heart sounds: Normal heart sounds.  Pulmonary:     Effort: Pulmonary effort is normal. No respiratory distress.     Breath sounds: Normal breath sounds.  Abdominal:     General: There is no distension.     Palpations: Abdomen is soft.     Tenderness: There is no abdominal tenderness.  Musculoskeletal:        General: No deformity. Normal range of motion.     Cervical back: Normal range of motion and neck supple.     Comments: Right upper arm graft present.  No active bleeding noted.  Palpable thrill present.  Distal radial pulse 2+.  Graft site without erythema or edema.  Skin:    General: Skin is warm and dry.  Neurological:     Mental Status: He is alert and oriented to person, place, and time.     ED Results / Procedures / Treatments   Labs (all labs ordered are listed, but only abnormal results are displayed) Labs Reviewed  HEMOGLOBIN AND HEMATOCRIT, BLOOD    EKG None  Radiology No results found.  Procedures Procedures   Medications Ordered in ED Medications - No data to display  ED Course  I have reviewed the triage vital signs and the nursing notes.  Pertinent labs & imaging results that were available during my care of the patient were reviewed by me and considered in my medical decision making (see chart for details).    MDM Rules/Calculators/A&P  MDM  Screen complete  DAKOTAH ORREGO was evaluated in Emergency Department on 10/22/2020 for the symptoms described in the history of present illness. He was evaluated in the context of the global COVID-19 pandemic, which necessitated consideration that the patient might be at risk for infection with the SARS-CoV-2 virus that causes COVID-19. Institutional protocols and algorithms that pertain to the evaluation of  patients at risk for COVID-19 are in a state of rapid change based on information released by regulatory bodies including the CDC and federal and state organizations. These policies and algorithms were followed during the patient's care in the ED.  Patient presented for evaluation of bleeding postdialysis from access site.  No bleeding noted during ED evaluation and observation.  H&H appears to be stable.  Patient does understand need for close follow-up.  Strict return precautions given understood.   Final Clinical Impression(s) / ED Diagnoses Final diagnoses:  Bleeding from dialysis shunt, initial encounter [facility])    Rx / DC Orders ED Discharge Orders    None       Valarie Merino, MD 10/22/20 1727

## 2020-10-22 NOTE — Discharge Instructions (Addendum)
Please return for any problem.  °

## 2020-10-23 ENCOUNTER — Encounter: Payer: Medicare Other | Admitting: Physical Therapy

## 2020-10-24 DIAGNOSIS — Z992 Dependence on renal dialysis: Secondary | ICD-10-CM | POA: Diagnosis not present

## 2020-10-24 DIAGNOSIS — R52 Pain, unspecified: Secondary | ICD-10-CM | POA: Diagnosis not present

## 2020-10-24 DIAGNOSIS — D689 Coagulation defect, unspecified: Secondary | ICD-10-CM | POA: Diagnosis not present

## 2020-10-24 DIAGNOSIS — E119 Type 2 diabetes mellitus without complications: Secondary | ICD-10-CM | POA: Diagnosis not present

## 2020-10-24 DIAGNOSIS — N186 End stage renal disease: Secondary | ICD-10-CM | POA: Diagnosis not present

## 2020-10-24 DIAGNOSIS — N2581 Secondary hyperparathyroidism of renal origin: Secondary | ICD-10-CM | POA: Diagnosis not present

## 2020-10-24 LAB — CUP PACEART REMOTE DEVICE CHECK
Battery Remaining Longevity: 36 mo
Battery Remaining Percentage: 42 %
Brady Statistic RV Percent Paced: 0 %
Date Time Interrogation Session: 20220314013100
HighPow Impedance: 124 Ohm
Implantable Lead Implant Date: 20111118
Implantable Lead Location: 753860
Implantable Lead Model: 181
Implantable Lead Serial Number: 313758
Implantable Pulse Generator Implant Date: 20111118
Lead Channel Impedance Value: 590 Ohm
Lead Channel Pacing Threshold Amplitude: 0.7 V
Lead Channel Pacing Threshold Pulse Width: 0.5 ms
Lead Channel Setting Pacing Amplitude: 2.4 V
Lead Channel Setting Pacing Pulse Width: 0.5 ms
Lead Channel Setting Sensing Sensitivity: 0.5 mV
Pulse Gen Serial Number: 268731

## 2020-10-25 ENCOUNTER — Encounter: Payer: Medicare Other | Admitting: Physical Therapy

## 2020-10-26 DIAGNOSIS — N2581 Secondary hyperparathyroidism of renal origin: Secondary | ICD-10-CM | POA: Diagnosis not present

## 2020-10-26 DIAGNOSIS — Z992 Dependence on renal dialysis: Secondary | ICD-10-CM | POA: Diagnosis not present

## 2020-10-26 DIAGNOSIS — D689 Coagulation defect, unspecified: Secondary | ICD-10-CM | POA: Diagnosis not present

## 2020-10-26 DIAGNOSIS — R52 Pain, unspecified: Secondary | ICD-10-CM | POA: Diagnosis not present

## 2020-10-26 DIAGNOSIS — E119 Type 2 diabetes mellitus without complications: Secondary | ICD-10-CM | POA: Diagnosis not present

## 2020-10-26 DIAGNOSIS — N186 End stage renal disease: Secondary | ICD-10-CM | POA: Diagnosis not present

## 2020-10-27 DIAGNOSIS — Z89512 Acquired absence of left leg below knee: Secondary | ICD-10-CM | POA: Diagnosis not present

## 2020-10-29 DIAGNOSIS — N186 End stage renal disease: Secondary | ICD-10-CM | POA: Diagnosis not present

## 2020-10-29 DIAGNOSIS — N2581 Secondary hyperparathyroidism of renal origin: Secondary | ICD-10-CM | POA: Diagnosis not present

## 2020-10-29 DIAGNOSIS — Z992 Dependence on renal dialysis: Secondary | ICD-10-CM | POA: Diagnosis not present

## 2020-10-29 DIAGNOSIS — D689 Coagulation defect, unspecified: Secondary | ICD-10-CM | POA: Diagnosis not present

## 2020-10-30 ENCOUNTER — Ambulatory Visit (INDEPENDENT_AMBULATORY_CARE_PROVIDER_SITE_OTHER): Payer: Medicare Other | Admitting: Physical Therapy

## 2020-10-30 ENCOUNTER — Other Ambulatory Visit: Payer: Self-pay

## 2020-10-30 ENCOUNTER — Encounter: Payer: Self-pay | Admitting: Physical Therapy

## 2020-10-30 DIAGNOSIS — R293 Abnormal posture: Secondary | ICD-10-CM

## 2020-10-30 DIAGNOSIS — R2681 Unsteadiness on feet: Secondary | ICD-10-CM

## 2020-10-30 DIAGNOSIS — R2689 Other abnormalities of gait and mobility: Secondary | ICD-10-CM

## 2020-10-30 DIAGNOSIS — M25661 Stiffness of right knee, not elsewhere classified: Secondary | ICD-10-CM

## 2020-10-30 DIAGNOSIS — M6281 Muscle weakness (generalized): Secondary | ICD-10-CM

## 2020-10-30 NOTE — Progress Notes (Signed)
Remote ICD transmission.   

## 2020-10-30 NOTE — Therapy (Signed)
[facility] Physical Therapy [address] [place], [place], [address] Phone: [phone]   Fax:  [phone]  Physical Therapy Treatment  Patient Details  Name: Timothy Faulkner MRN: [id number] Date of Birth: 08-31-1945 Referring Provider (PT): Monica Martinez, MD   Encounter Date: 10/30/2020   PT End of Session - 10/30/20 4827    Visit Number 8    Number of Visits 25    Date for PT Re-Evaluation 12/24/20    Authorization Type UHC Medicare    Progress Note Due on Visit 10    PT Start Time 1425    PT Stop Time 1515    PT Time Calculation (min) 50 min    Equipment Utilized During Treatment Gait belt    Activity Tolerance Patient tolerated treatment well;Patient limited by fatigue    Behavior During Therapy [facility] for tasks assessed/performed           Past Medical History:  Diagnosis Date  . Anemia   . Arthritis    Gout  . Automatic implantable cardioverter-defibrillator in situ    [employer]  . CHF   . ESRD on dialysis [facility])    [name] 03/11/2013 (03/11/2013) dialysis M/W/F  . Gangrene ([place])    left foot  . GERD (gastroesophageal reflux disease)   . Gout    "once a year"  . Heart murmur   . HYPERCHOLESTEROLEMIA, MIXED   . HYPERTENSION   . Macular degeneration   . Osteomyelitis ([place])    right foot  . 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 Pacemaker   . Peripheral arterial disease (HCC)    left fifth toe ulcer, healing  . Pneumonia   . Shortness of breath   . 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 Wears dentures   . Wears glasses     Past Surgical History:  Procedure Laterality Date  . A/V FISTULAGRAM Left 07/20/2018   Procedure: A/V FISTULAGRAM;  Surgeon: [name], MD;  Location: [place] [facility];  Service: Cardiovascular;  Laterality: Left;  . A/V FISTULAGRAM N/A 07/15/2019   Procedure: A/V FISTULAGRAM - Left Arm;  Surgeon: Elam Dutch, MD;  Location: [place] [facility]  [facility];  Service: Cardiovascular;  Laterality: N/A;  . ABDOMINAL AORTOGRAM N/A 02/25/2019   Procedure: ABDOMINAL AORTOGRAM;  Surgeon: Elam Dutch, MD;  Location: [place] [facility];  Service: Cardiovascular;  Laterality: N/A;  . ABDOMINAL AORTOGRAM W/LOWER EXTREMITY Bilateral 07/20/2018   Procedure: ABDOMINAL AORTOGRAM W/LOWER EXTREMITY;  Surgeon: [name], MD;  Location: [place] [facility];  Service: Cardiovascular;  Laterality: Bilateral;  . AMPUTATION Right 09/07/2018   Procedure: Right fifth metatarsectomy;  Surgeon: [name], DPM;  Location: [place];  Service: Podiatry;  Laterality: Right;  . AMPUTATION Left 03/08/2019   Procedure: AMPUTATION  3RD AND 4TH TOES LEFT FOOT;  Surgeon: [name], DPM;  Location: [place];  Service: Podiatry;  Laterality: Left;  . AMPUTATION Left 03/15/2019   Procedure: AMPUTATION BELOW KNEE;  Surgeon: Elam Dutch, MD;  Location: [facility];  Service: Vascular;  Laterality: Left;  . AV FISTULA PLACEMENT Right 12/13/2012   Procedure: ARTERIOVENOUS (AV) FISTULA CREATION;  Surgeon: [name], MD;  Location: [place];  Service: Vascular;  Laterality: Right;  Ultrasound guided  . AV FISTULA PLACEMENT Left 05/07/2016   Procedure: LEFT RADIOCEPHALIC ARTERIOVENOUS (AV) FISTULA CREATION;  Surgeon: [name], MD;  Location: [place];  Service:  Vascular;  Laterality: Left;  . AV FISTULA PLACEMENT Right 08/09/2019   Procedure: INSERTION OF RIGHT ARTERIOVENOUS (AV) 4-70m GORE-TEX STRETCH GRAFT RIGHT  ARM;  Surgeon: FElam Dutch MD;  [place]  Service: Vascular;  Laterality: Right;  . BASCILIC VEIN TRANSPOSITION Right 03/26/2016   Procedure: RIGHT BASILIC VEIN TRANSPOSITION;  Surgeon: TRosetta Posner MD;  [place]  Service: Vascular;  Laterality: Right;  . CARDIAC CATHETERIZATION    . CARDIAC DEFIBRILLATOR PLACEMENT     BChemical engineer . CATARACT EXTRACTION W/PHACO Bilateral   . EYE SURGERY Bilateral    Cataract  . FISTULOGRAM Left  04/22/2018   Procedure: FISTULOGRAM UPPER EXTREMITY;  Surgeon: CMarty Heck MD;  [place]  Service: Vascular;  Laterality: Left;  . I & D EXTREMITY Right 07/15/2018   Procedure: IRRIGATION AND DEBRIDEMENT RIGHT FOOT;  Surgeon: PEvelina Bucy DPM;  [place]  Service: Podiatry;  Laterality: Right;  . INCISION AND DRAINAGE ABSCESS / HEMATOMA OF BURSA / KNEE / THIGH Left 2012   "knee" (03/11/2013)  . INSERT / REPLACE / REMOVE PACEMAKER    . INSERTION OF DIALYSIS CATHETER Left 04/22/2018   Procedure: INSERTION OF DIALYSIS CATHETER;  Surgeon: CMarty Heck MD;  [place]  Service: Vascular;  Laterality: Left;  . IR FLUORO GUIDE CV LINE LEFT  07/15/2018  . IR PTA VENOUS EXCEPT DIALYSIS CIRCUIT  07/15/2018  . LOWER EXTREMITY ANGIOGRAPHY Right 07/21/2018   Procedure: LOWER EXTREMITY ANGIOGRAPHY;  Surgeon: CMarty Heck MD;  [place] [facility];  Service: Cardiovascular;  Laterality: Right;  . LOWER EXTREMITY ANGIOGRAPHY Bilateral 02/25/2019   Procedure: Lower Extremity Angiography;  Surgeon: FElam Dutch MD;  [place] [place] [facility];  Service: Cardiovascular;  Laterality: Bilateral;  . METATARSAL HEAD EXCISION Right 07/15/2018   Procedure: METATARSAL RESECTION;  Surgeon: PEvelina Bucy DPM;  [place]  Service: Podiatry;  Laterality: Right;  . MULTIPLE TOOTH EXTRACTIONS    . PERIPHERAL VASCULAR ATHERECTOMY Right 02/28/2019   Procedure: PERIPHERAL VASCULAR ATHERECTOMY;  Surgeon: CWaynetta Sandy MD;  [place] [facility];  Service: Cardiovascular;  Laterality: Right;  right tp trunk   . PERIPHERAL VASCULAR BALLOON ANGIOPLASTY Left 02/25/2019   Procedure: PERIPHERAL VASCULAR BALLOON ANGIOPLASTY;  Surgeon: FElam Dutch MD;  [facility] [place] [facility];  Service: Cardiovascular;  Laterality: Left;  tibial peroneal trunk and PT  . PERIPHERAL VASCULAR INTERVENTION Right 07/21/2018   Procedure: PERIPHERAL VASCULAR  INTERVENTION;  Surgeon: CMarty Heck MD;  [place];  Service: Cardiovascular;  Laterality: Right;  peroneal stents  . REVISON OF ARTERIOVENOUS FISTULA Right 35/18/8416  Procedure: Plication OF Right Arm ARTERIOVENOUS FISTULA;  Surgeon: CElam Dutch MD;  [facility]  Service: Vascular;  Laterality: Right;  . REVISON OF ARTERIOVENOUS FISTULA Left 04/22/2018   Procedure: REVISION OF RADIOCEPHALIC ARTERIOVENOUS FISTULA;  Surgeon: CMarty Heck MD;  [place]  Service: Vascular;  Laterality: Left;  . SHUNTOGRAM N/A 05/31/2013   Procedure: SEarney Mallet  Surgeon: VSerafina Mitchell MD;  [facility];  Service: Cardiovascular;  Laterality: N/A;  . UPPER EXTREMITY VENOGRAPHY Right 07/23/2018   Procedure: UPPER EXTREMITY VENOGRAPHY;  Surgeon: DAngelia Mould MD;  [place];  Service: Cardiovascular;  Laterality: Right;  . UPPER EXTREMITY VENOGRAPHY  07/15/2019   Procedure: UPPER EXTREMITY VENOGRAPHY;  Surgeon: FElam Dutch MD;  [place] [facility] [facility];  Service: Cardiovascular;;  rt arm   . WOUND DEBRIDEMENT Right 07/17/2018   Procedure: Wound Debridement; Closure Filleted toe flap Right Foot;  Surgeon: [name], DPM;  Location: [place];  Service: Podiatry;  Laterality: Right;  . WOUND DEBRIDEMENT Right 09/07/2018   Procedure: Debridement Right Foot Wound, application of wound vac;  Surgeon: [name], DPM;  Location: [place];  Service: Podiatry;  Laterality: Right;  . WOUND DEBRIDEMENT Right 03/08/2019   Procedure: DEBRIDEMENT WOUND RIGHT FOOT;  Surgeon: [name], DPM;  Location: [place];  Service: Podiatry;  Laterality: Right;    There were no vitals filed for this visit.   Subjective Assessment - 10/30/20 1425    Subjective He has worn prosthesis daily. He wears prosthesis 4hrs then break, then 3 hrs.  He has been walking in home with RW.    Pertinent History PAD, DM2, arthritis, gout, CHF,  implanted cardioverter-defib, HTN, CKD w/ ESRD    Patient Stated Goals to use prosthesis to walk in community,    Currently in Pain? No/denies                             [facility] Adult PT Treatment/Exercise - 10/30/20 1425      Transfers   Transfers Sit to Stand;Stand to Sit;Squat Pivot Transfers    Sit to Stand 5: Supervision;With upper extremity assist;With armrests;From chair/3-in-1;Other (comment)   requires RW to stabilize   Stand to Sit 5: Supervision;With upper extremity assist;With armrests;To chair/3-in-1;Other (comment)   requires RW for stability     Ambulation/Gait   Ambulation/Gait Yes    Ambulation/Gait Assistance 4: Min guard    Ambulation/Gait Assistance Details PT demo & verbal cues on assisting patient with household gait & 1x/day on non-dialysis days for maximal tolerable distance (how to safely assist & bring w/c along). Pt & dtr verbalized understanding.    Ambulation Distance (Feet) 68 Feet   68' max tolerable distance, 2nd time 21'   Assistive device Rolling walker;Prosthesis      Self-Care   Self-Care ADL's    ADL's PT instructed in walking in/out of bathroom to tub bench, removing prosthesis to bathe.  Pt & dtr verbalized understanding.      Therapeutic Activites    Other Therapeutic Activities --      Neuro Re-ed    Neuro Re-ed Details  --      Exercises   Other Exercises  sit to stand from barstool without UE push up 10 reps with min guard bars. Standing hip flexion and abd with bilat UE support X 10 bilat. Seated LAQ 2X10 bilat with 5#      Knee/Hip Exercises: Aerobic   Nustep seat 11 L5 for 6 min      Prosthetics   Prosthetic Care Comments  PT instructed with verbal cues on how to weigh prosthesis at dialysis and subtract from weigh-in & weigh-out. never adjust dry weight to include prosthesis.   PT instructed in prosthesis wear lowers fall risk, decreases stress on right foot and decreases energy expenditure.  PT recommended  increasing wear to all awake hours.    Current prosthetic wear tolerance (days/week)  nondialysis days daily,    Current prosthetic wear tolerance (#hours/day)  4hrs & 3 hrs    Edema pitting edema    Residual limb condition  No open areas, dry skin, normal temperature, normal color, cylinerical shape    Education Provided Residual limb care;Correct ply  sock adjustment;Proper wear schedule/adjustment;Proper weight-bearing schedule/adjustment    Person(s) Educated Patient;Spouse    Education Method Explanation;Verbal cues    Education Method Verbalized understanding;Verbal cues required;Needs further [place] Prosthesis Supervision                    PT Short Term Goals - 10/30/20 1432      PT SHORT TERM GOAL #1   Title Patient donnes prosthesis with verbal cues only.    Baseline MET 10/30/2020    Time 4    Period Weeks    Status Achieved    Target Date 10/25/20      PT SHORT TERM GOAL #2   Title Patient tolerates prosthesis wear >8hrs total on nondialysis days.    Baseline Partially MET 10/30/2020 Pt repsorts wear 7 hrs total /day    Time 4    Period Weeks    Status Partially Met    Target Date 10/25/20      PT SHORT TERM GOAL #3   Title Patient standing balance with RW & prosthesis reaching 7" anteriorly & within 10" of floor with supervision.    Baseline Not MET 10/30/2020 He requires minA for balance.    Time 4    Period Weeks    Status Not Met    Target Date 10/25/20      PT SHORT TERM GOAL #4   Title Patient ambulates 63" with RW & prosthesis with minA.    Baseline MET 3/22/022    Time 4    Period Weeks    Status Achieved    Target Date 10/25/20             PT Short Term Goals - 10/30/20 1546      PT SHORT TERM GOAL #1   Title Patient verbalizes proper adjustment of ply socks.    Time 4    Period Weeks    Status New    Target Date 11/29/20      PT SHORT TERM GOAL #2   Title Patient tolerates prosthesis wear >12hrs total on  nondialysis days & >3hrs after dialysis if out of bed.    Time 4    Period Weeks    Status Revised    Target Date 11/29/20      PT SHORT TERM GOAL #3   Title Patient standing balance with RW & prosthesis reaching 7" anteriorly & within 10" of floor with supervision.    Time 4    Period Weeks    Status On-going    Target Date 11/29/20      PT SHORT TERM GOAL #4   Title Patient ambulates 125' with RW & prosthesis with supervision.    Time 4    Period Weeks    Status Revised    Target Date 11/29/20      PT SHORT TERM GOAL #5   Title Patient negotiates ramps & curbs with RW & prostheis with modA    Time 4    Period Weeks    Status New    Target Date 11/29/20             PT Long Term Goals - 10/30/20 1541      PT LONG TERM GOAL #1   Title Patient & family verbalize & demonstrate understanding of proper prosthetic care to enable utilization of prosthesis.    Time 12    Period Weeks    Status On-going    Target Date 12/20/20  PT LONG TERM GOAL #2   Title Patient tolerates wear of prosthesis >90% of awake hours without skin or limb discomfort issues.    Time 12    Period Weeks    Status On-going    Target Date 12/20/20      PT LONG TERM GOAL #3   Title [place] Balance >/= 36/56    Time 12    Period Weeks    Status On-going    Target Date 12/20/20      PT LONG TERM GOAL #4   Title Patient ambulates 200' with RW & prosthesis modified independent.    Time 12    Period Weeks    Status On-going    Target Date 12/20/20      PT LONG TERM GOAL #5   Title Patient negotiates ramps & curbs with RW & prosthesis with minA.    Time 12    Period Weeks    Status On-going    Target Date 12/20/20                 Plan - 10/30/20 1432    Clinical Impression Statement Patient met or partially met 3 of 4 STGs set for initial 30 days.  PT instructed to increase wear & initiate increased activity level with RW & prosthesis in home with family assist for safety.     Personal Factors and Comorbidities Comorbidity 3+;Fitness;Time since onset of injury/illness/exacerbation    Comorbidities PAD, DM2, arthritis, gout, CHF, implanted cardioverter-defib, HTN, CKD w/ ESRD, blind left knee    Examination-Activity Limitations Locomotion Level;Stairs;Stand;Transfers    Examination-Participation Restrictions Community Activity    Stability/Clinical Decision Making Evolving/Moderate complexity    Rehab Potential Good    PT Frequency 2x / week    PT Duration 12 weeks    PT Treatment/Interventions ADLs/Self Care Home Management;DME Instruction;Gait training;Stair training;Functional mobility training;Therapeutic activities;Therapeutic exercise;Balance training;Neuromuscular re-education;Patient/family education;Prosthetic Training;Manual techniques;Vestibular    PT Next Visit Plan review prosthetic care,  prosthetic gait with RW including ramps & curbs,  exercise for strength, balance, flexibility & endurance.    Consulted and Agree with Plan of Care Patient;Family member/caregiver    Family Member Consulted daughter,Sherry Cira Servant           Patient will benefit from skilled therapeutic intervention in order to improve the following deficits and impairments:  Abnormal gait,Cardiopulmonary status limiting activity,Decreased activity tolerance,Decreased balance,Decreased endurance,Decreased knowledge of use of DME,Decreased mobility,Decreased range of motion,Decreased scar mobility,Decreased strength,Increased edema,Postural dysfunction,Prosthetic Dependency  Visit Diagnosis: Unsteadiness on feet  Other abnormalities of gait and mobility  Muscle weakness (generalized)  Stiffness of right knee, not elsewhere classified  Abnormal posture     Problem List Patient Active Problem List   Diagnosis Date Noted  . Polycythemia, secondary 02/21/2020  . Severe nonproliferative diabetic retinopathy of right eye without macular edema associated with type 2 diabetes  mellitus ([place]) 11/29/2019  . Severe nonproliferative diabetic retinopathy of left eye without macular edema associated with type 2 diabetes mellitus ([place]) 11/29/2019  . Advanced nonexudative age-related macular degeneration of both eyes with subfoveal involvement 11/29/2019  . 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  . Sepsis ([place]) 04/14/2019  . Hypocalcemia 04/08/2019  . Drug induced constipation   . Anemia of chronic disease   . ESRD on dialysis ([place])   . Postoperative pain   . Phantom limb pain ([place])   . S/P BKA (below knee amputation), left ([place]) 03/18/2019  . Unilateral complete BKA, left, initial encounter ([place]) 03/17/2019  .  Critical lower limb ischemia ([place]) 03/12/2019  . Infection of amputation stump, right lower extremity ([place]) 03/10/2019  . Hypotension 02/23/2019  . Cellulitis of left foot 02/23/2019  . Gangrene of toe of left foot ([place]) 02/22/2019  . Complication of vascular dialysis catheter 11/24/2018  . Chronic osteomyelitis involving right ankle and foot ([place])   . Ulcer of right foot with fat layer exposed ([place]) 09/02/2018  . Encounter for planned post-operative wound closure   . Acute osteomyelitis of metatarsal bone of right foot ([place])   . Diabetic ulcer of midfoot associated with diabetes mellitus due to underlying condition, with necrosis of bone ([place])   . Osteomyelitis of right foot ([place])   . Vascular calcification   . Cellulitis and abscess of foot, except toes   . Anemia associated with chronic renal failure 07/13/2018  . Cardiomyopathy, dilated, nonischemic ([place]) 07/13/2018  . Diabetic foot infection ([place]) 07/13/2018  . PAD (peripheral artery disease) ([place])   . Macular degeneration   . Ventricular tachycardia ([place]) 06/24/2017  . Embolism due to vascular prosthetic devices, implants and grafts, initial encounter ([place]) 02/04/2016  . Dependence on renal dialysis ([place]) 05/30/2015  . Shortness of breath 02/05/2015  . Chronic systolic heart failure  ([place]) 08/22/2014  . Diabetic infection of right foot ([place]) 07/13/2014  . Type 2 diabetes mellitus with diabetic peripheral angiopathy without gangrene ([place]) 03/18/2014  . Anxiety disorder, unspecified 02/23/2014  . Fever, unspecified 02/23/2014  . Headache 02/23/2014  . Pain, unspecified 02/23/2014  . Pruritus, unspecified 02/23/2014  . Mild protein-calorie malnutrition ([place]) 12/26/2013  . ESRD (end stage renal disease) on dialysis ([place]) 11/11/2013  . Snoring 11/11/2013  . Unspecified sleep apnea 11/11/2013  . Other pancytopenia ([place]) 09/22/2013  . Hemoptysis 09/20/2013  . Personal history of nicotine dependence 07/12/2013  . Coagulation defect, unspecified ([place]) 05/16/2013  . Iron deficiency anemia, unspecified 05/06/2013  . Other disorders of plasma-protein metabolism, not elsewhere classified 04/15/2013  . 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 Other cardiomyopathies ([place]) 03/18/2013  . Secondary hyperparathyroidism of renal origin ([place]) 03/18/2013  . Type 2 diabetes mellitus without complications ([place]) 46/95/0722  . Pre-transplant evaluation for kidney transplant 01/11/2013  . Automatic implantable cardioverter-defibrillator in situ 10/01/2010  . HYPERCHOLESTEROLEMIA, MIXED 05/03/2010  . Essential hypertension 05/03/2010  . CHF 05/03/2010    Jamey Reas, PT, DPT 10/30/2020, 3:45 PM  [facility] Physical Therapy [address] [place], [place], [address] Phone: [phone]   Fax:  [phone]  Name: Timothy Faulkner MRN: [id number] Date of Birth: [birth year]

## 2020-10-31 DIAGNOSIS — D689 Coagulation defect, unspecified: Secondary | ICD-10-CM | POA: Diagnosis not present

## 2020-10-31 DIAGNOSIS — N186 End stage renal disease: Secondary | ICD-10-CM | POA: Diagnosis not present

## 2020-10-31 DIAGNOSIS — Z992 Dependence on renal dialysis: Secondary | ICD-10-CM | POA: Diagnosis not present

## 2020-10-31 DIAGNOSIS — N2581 Secondary hyperparathyroidism of renal origin: Secondary | ICD-10-CM | POA: Diagnosis not present

## 2020-11-01 ENCOUNTER — Other Ambulatory Visit: Payer: Self-pay

## 2020-11-01 ENCOUNTER — Encounter: Payer: Self-pay | Admitting: Physical Therapy

## 2020-11-01 ENCOUNTER — Ambulatory Visit (INDEPENDENT_AMBULATORY_CARE_PROVIDER_SITE_OTHER): Payer: Medicare Other | Admitting: Physical Therapy

## 2020-11-01 DIAGNOSIS — R2681 Unsteadiness on feet: Secondary | ICD-10-CM

## 2020-11-01 DIAGNOSIS — M25661 Stiffness of right knee, not elsewhere classified: Secondary | ICD-10-CM | POA: Diagnosis not present

## 2020-11-01 DIAGNOSIS — R293 Abnormal posture: Secondary | ICD-10-CM | POA: Diagnosis not present

## 2020-11-01 DIAGNOSIS — M6281 Muscle weakness (generalized): Secondary | ICD-10-CM

## 2020-11-01 DIAGNOSIS — R2689 Other abnormalities of gait and mobility: Secondary | ICD-10-CM | POA: Diagnosis not present

## 2020-11-01 NOTE — Therapy (Signed)
[facility] [address] [place], [place], [address] Phone: [phone]   Fax:  [phone]  Physical Therapy Treatment  Patient Details  Name: Timothy Faulkner MRN: [id number] Date of Birth: Dec 04, 1945 Referring Provider (PT): Monica Martinez, MD   Encounter Date: 11/01/2020   PT End of Session - 11/01/20 1152    Visit Number 9    Number of Visits 25    Date for PT Re-Evaluation 12/24/20    Authorization Type UHC Medicare    Progress Note Due on Visit 10    PT Start Time 1145    PT Stop Time 1230    PT Time Calculation (min) 45 min    Equipment Utilized During Treatment Gait belt    Activity Tolerance Patient tolerated treatment well;Patient limited by fatigue    Behavior During Therapy [facility] for tasks assessed/performed           Past Medical History:  Diagnosis Date  . Anemia   . Arthritis    Gout  . Automatic implantable cardioverter-defibrillator in situ    [employer]  . CHF   . ESRD on dialysis [facility])    [name] 03/11/2013 (03/11/2013) dialysis M/W/F  . Gangrene ([place])    left foot  . GERD (gastroesophageal reflux disease)   . Gout    "once a year"  . Heart murmur   . HYPERCHOLESTEROLEMIA, MIXED   . HYPERTENSION   . Macular degeneration   . Osteomyelitis ([place])    right foot  . 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 Pacemaker   . Peripheral arterial disease (HCC)    left fifth toe ulcer, healing  . Pneumonia   . Shortness of breath   . 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 Wears dentures   . Wears glasses     Past Surgical History:  Procedure Laterality Date  . A/V FISTULAGRAM Left 07/20/2018   Procedure: A/V FISTULAGRAM;  Surgeon: [name], MD;  Location: [place] CV LAB;  Service: Cardiovascular;  Laterality: Left;  . A/V FISTULAGRAM N/A 07/15/2019   Procedure: A/V FISTULAGRAM - Left Arm;  Surgeon: Elam Dutch, MD;  Location: [place] CV  LAB;  Service: Cardiovascular;  Laterality: N/A;  . ABDOMINAL AORTOGRAM N/A 02/25/2019   Procedure: ABDOMINAL AORTOGRAM;  Surgeon: Elam Dutch, MD;  Location: [place] CV LAB;  Service: Cardiovascular;  Laterality: N/A;  . ABDOMINAL AORTOGRAM W/LOWER EXTREMITY Bilateral 07/20/2018   Procedure: ABDOMINAL AORTOGRAM W/LOWER EXTREMITY;  Surgeon: [name], MD;  Location: [place] CV LAB;  Service: Cardiovascular;  Laterality: Bilateral;  . AMPUTATION Right 09/07/2018   Procedure: Right fifth metatarsectomy;  Surgeon: [name], DPM;  Location: [place];  Service: Podiatry;  Laterality: Right;  . AMPUTATION Left 03/08/2019   Procedure: AMPUTATION  3RD AND 4TH TOES LEFT FOOT;  Surgeon: [name], DPM;  Location: [place];  Service: Podiatry;  Laterality: Left;  . AMPUTATION Left 03/15/2019   Procedure: AMPUTATION BELOW KNEE;  Surgeon: Elam Dutch, MD;  Location: [facility] OR;  Service: Vascular;  Laterality: Left;  . AV FISTULA PLACEMENT Right 12/13/2012   Procedure: ARTERIOVENOUS (AV) FISTULA CREATION;  Surgeon: [name], MD;  Location: [place];  Service: Vascular;  Laterality: Right;  Ultrasound guided  . AV FISTULA PLACEMENT Left 05/07/2016   Procedure: LEFT RADIOCEPHALIC ARTERIOVENOUS (AV) FISTULA CREATION;  Surgeon: [name], MD;  Location: [place];  Service:  Vascular;  Laterality: Left;  . AV FISTULA PLACEMENT Right 08/09/2019   Procedure: INSERTION OF RIGHT ARTERIOVENOUS (AV) 4-70m GORE-TEX STRETCH GRAFT RIGHT  ARM;  Surgeon: FElam Dutch MD;  [place]  Service: Vascular;  Laterality: Right;  . BASCILIC VEIN TRANSPOSITION Right 03/26/2016   Procedure: RIGHT BASILIC VEIN TRANSPOSITION;  Surgeon: TRosetta Posner MD;  [place]  Service: Vascular;  Laterality: Right;  . CARDIAC CATHETERIZATION    . CARDIAC DEFIBRILLATOR PLACEMENT     BChemical engineer . CATARACT EXTRACTION W/PHACO Bilateral   . EYE SURGERY Bilateral    Cataract  . FISTULOGRAM Left  04/22/2018   Procedure: FISTULOGRAM UPPER EXTREMITY;  Surgeon: CMarty Heck MD;  [place]  Service: Vascular;  Laterality: Left;  . I & D EXTREMITY Right 07/15/2018   Procedure: IRRIGATION AND DEBRIDEMENT RIGHT FOOT;  Surgeon: PEvelina Bucy DPM;  [place]  Service: Podiatry;  Laterality: Right;  . INCISION AND DRAINAGE ABSCESS / HEMATOMA OF BURSA / KNEE / THIGH Left 2012   "knee" (03/11/2013)  . INSERT / REPLACE / REMOVE PACEMAKER    . INSERTION OF DIALYSIS CATHETER Left 04/22/2018   Procedure: INSERTION OF DIALYSIS CATHETER;  Surgeon: CMarty Heck MD;  [place]  Service: Vascular;  Laterality: Left;  . IR FLUORO GUIDE CV LINE LEFT  07/15/2018  . IR PTA VENOUS EXCEPT DIALYSIS CIRCUIT  07/15/2018  . LOWER EXTREMITY ANGIOGRAPHY Right 07/21/2018   Procedure: LOWER EXTREMITY ANGIOGRAPHY;  Surgeon: CMarty Heck MD;  [place];  Service: Cardiovascular;  Laterality: Right;  . LOWER EXTREMITY ANGIOGRAPHY Bilateral 02/25/2019   Procedure: Lower Extremity Angiography;  Surgeon: FElam Dutch MD;  [place];  Service: Cardiovascular;  Laterality: Bilateral;  . METATARSAL HEAD EXCISION Right 07/15/2018   Procedure: METATARSAL RESECTION;  Surgeon: PEvelina Bucy DPM;  [place]  Service: Podiatry;  Laterality: Right;  . MULTIPLE TOOTH EXTRACTIONS    . PERIPHERAL VASCULAR ATHERECTOMY Right 02/28/2019   Procedure: PERIPHERAL VASCULAR ATHERECTOMY;  Surgeon: CWaynetta Sandy MD;  [place];  Service: Cardiovascular;  Laterality: Right;  right tp trunk   . PERIPHERAL VASCULAR BALLOON ANGIOPLASTY Left 02/25/2019   Procedure: PERIPHERAL VASCULAR BALLOON ANGIOPLASTY;  Surgeon: FElam Dutch MD;  [place];  Service: Cardiovascular;  Laterality: Left;  tibial peroneal trunk and PT  . PERIPHERAL VASCULAR INTERVENTION Right 07/21/2018   Procedure: PERIPHERAL VASCULAR  INTERVENTION;  Surgeon: CMarty Heck MD;  [place];  Service: Cardiovascular;  Laterality: Right;  peroneal stents  . REVISON OF ARTERIOVENOUS FISTULA Right 35/18/8416  Procedure: Plication OF Right Arm ARTERIOVENOUS FISTULA;  Surgeon: CElam Dutch MD;  [facility]  Service: Vascular;  Laterality: Right;  . REVISON OF ARTERIOVENOUS FISTULA Left 04/22/2018   Procedure: REVISION OF RADIOCEPHALIC ARTERIOVENOUS FISTULA;  Surgeon: CMarty Heck MD;  [place]  Service: Vascular;  Laterality: Left;  . SHUNTOGRAM N/A 05/31/2013   Procedure: SEarney Mallet  Surgeon: VSerafina Mitchell MD;  [facility];  Service: Cardiovascular;  Laterality: N/A;  . UPPER EXTREMITY VENOGRAPHY Right 07/23/2018   Procedure: UPPER EXTREMITY VENOGRAPHY;  Surgeon: DAngelia Mould MD;  [place] [facility] [facility];  Service: Cardiovascular;  Laterality: Right;  . UPPER EXTREMITY VENOGRAPHY  07/15/2019   Procedure: UPPER EXTREMITY VENOGRAPHY;  Surgeon: FElam Dutch MD;  [place];  Service: Cardiovascular;;  rt arm   . WOUND DEBRIDEMENT Right 07/17/2018   Procedure: Wound Debridement; Closure Filleted toe flap Right Foot;  Surgeon: [name], DPM;  Location: [place];  Service: Podiatry;  Laterality: Right;  . WOUND DEBRIDEMENT Right 09/07/2018   Procedure: Debridement Right Foot Wound, application of wound vac;  Surgeon: [name], DPM;  Location: [place];  Service: Podiatry;  Laterality: Right;  . WOUND DEBRIDEMENT Right 03/08/2019   Procedure: DEBRIDEMENT WOUND RIGHT FOOT;  Surgeon: [name], DPM;  Location: [place];  Service: Podiatry;  Laterality: Right;    There were no vitals filed for this visit.   Subjective Assessment - 11/01/20 1153    Subjective He plans to weigh prosthesis at dialysis tomorrow and start wearing it to dialysis. He wore prosthesis on Tuesday for ~5-6 hrs 1x/day.    Pertinent History PAD, DM2,  arthritis, gout, CHF, implanted cardioverter-defib, HTN, CKD w/ ESRD    Patient Stated Goals to use prosthesis to walk in community,    Currently in Pain? No/denies                             [facility] Adult PT Treatment/Exercise - 11/01/20 1145      Transfers   Transfers Sit to Stand;Stand to Sit;Squat Pivot Transfers    Sit to Stand 5: Supervision;With upper extremity assist;With armrests;From chair/3-in-1;Other (comment)   requires RW to stabilize   Stand to Sit 5: Supervision;With upper extremity assist;With armrests;To chair/3-in-1;Other (comment)   requires RW for stability     Ambulation/Gait   Ambulation/Gait Yes    Ambulation/Gait Assistance 5: Supervision    Ambulation/Gait Assistance Details verbal cues on upright posture    Ambulation Distance (Feet) 50 Feet   50' + curb seated rest 50' + ramp   Assistive device Rolling walker;Prosthesis    Ambulation Surface Level;Indoor    Gait Comments PT instructed to increase activity tolerance by ambulating in home to change locations instead of w/c.  One time per day walk max tolerable with family assist & following with w/c.  When going out like restaurant, begin to walk into restaurant from car with w/c following sit when fatigued and walk out of restaurant to car until fatigued. Pt & dtr verbalized understanding.      Self-Care   Self-Care --    ADL's --      Exercises   Other Exercises  --      Knee/Hip Exercises: Aerobic   Nustep seat 11 L5 for 8 min      Prosthetics   Current prosthetic wear tolerance (days/week)  nondialysis days daily,    Current prosthetic wear tolerance (#hours/day)  6 hrs 1x/day    Edema pitting edema    Residual limb condition  No open areas, dry skin, normal temperature, normal color, cylinerical shape    Education Provided Residual limb care;Correct ply sock adjustment;Proper wear schedule/adjustment;Proper weight-bearing schedule/adjustment                    PT Short  Term Goals - 10/30/20 1546      PT SHORT TERM GOAL #1   Title Patient verbalizes proper adjustment of ply socks.    Time 4    Period Weeks    Status New    Target Date 11/29/20      PT SHORT TERM GOAL #2   Title Patient tolerates prosthesis wear >12hrs total on nondialysis  days & >3hrs after dialysis if out of bed.    Time 4    Period Weeks    Status Revised    Target Date 11/29/20      PT SHORT TERM GOAL #3   Title Patient standing balance with RW & prosthesis reaching 7" anteriorly & within 10" of floor with supervision.    Time 4    Period Weeks    Status On-going    Target Date 11/29/20      PT SHORT TERM GOAL #4   Title Patient ambulates 125' with RW & prosthesis with supervision.    Time 4    Period Weeks    Status Revised    Target Date 11/29/20      PT SHORT TERM GOAL #5   Title Patient negotiates ramps & curbs with RW & prostheis with modA    Time 4    Period Weeks    Status New    Target Date 11/29/20             PT Long Term Goals - 10/30/20 1541      PT LONG TERM GOAL #1   Title Patient & family verbalize & demonstrate understanding of proper prosthetic care to enable utilization of prosthesis.    Time 12    Period Weeks    Status On-going    Target Date 12/20/20      PT LONG TERM GOAL #2   Title Patient tolerates wear of prosthesis >90% of awake hours without skin or limb discomfort issues.    Time 12    Period Weeks    Status On-going    Target Date 12/20/20      PT LONG TERM GOAL #3   Title Berg Balance >/= 36/56    Time 12    Period Weeks    Status On-going    Target Date 12/20/20      PT LONG TERM GOAL #4   Title Patient ambulates 200' with RW & prosthesis modified independent.    Time 12    Period Weeks    Status On-going    Target Date 12/20/20      PT LONG TERM GOAL #5   Title Patient negotiates ramps & curbs with RW & prosthesis with minA.    Time 12    Period Weeks    Status On-going    Target Date 12/20/20                  Plan - 11/01/20 1153    Clinical Impression Statement PT instructed in increasing wear & increasing activity tolerance.  Pt verbalizes understanding but will need further encouragement for max compliance.  He required less assistance with gait including ramps & curbs.    Personal Factors and Comorbidities Comorbidity 3+;Fitness;Time since onset of injury/illness/exacerbation    Comorbidities PAD, DM2, arthritis, gout, CHF, implanted cardioverter-defib, HTN, CKD w/ ESRD, blind left knee    Examination-Activity Limitations Locomotion Level;Stairs;Stand;Transfers    Examination-Participation Restrictions Community Activity    Stability/Clinical Decision Making Evolving/Moderate complexity    Rehab Potential Good    PT Frequency 2x / week    PT Duration 12 weeks    PT Treatment/Interventions ADLs/Self Care Home Management;DME Instruction;Gait training;Stair training;Functional mobility training;Therapeutic activities;Therapeutic exercise;Balance training;Neuromuscular re-education;Patient/family education;Prosthetic Training;Manual techniques;Vestibular    PT Next Visit Plan review prosthetic care,  prosthetic gait with RW including ramps & curbs,  exercise for strength, balance, flexibility & endurance.    Consulted and Agree  with Plan of Care Patient;Family member/caregiver    Family Member Consulted daughter,Sherry Cira Servant           Patient will benefit from skilled therapeutic intervention in order to improve the following deficits and impairments:  Abnormal gait,Cardiopulmonary status limiting activity,Decreased activity tolerance,Decreased balance,Decreased endurance,Decreased knowledge of use of DME,Decreased mobility,Decreased range of motion,Decreased scar mobility,Decreased strength,Increased edema,Postural dysfunction,Prosthetic Dependency  Visit Diagnosis: Unsteadiness on feet  Other abnormalities of gait and mobility  Muscle weakness (generalized)  Stiffness of  right knee, not elsewhere classified  Abnormal posture     Problem List Patient Active Problem List   Diagnosis Date Noted  . Polycythemia, secondary 02/21/2020  . Severe nonproliferative diabetic retinopathy of right eye without macular edema associated with type 2 diabetes mellitus ([place]) 11/29/2019  . Severe nonproliferative diabetic retinopathy of left eye without macular edema associated with type 2 diabetes mellitus ([place]) 11/29/2019  . Advanced nonexudative age-related macular degeneration of both eyes with subfoveal involvement 11/29/2019  . 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  . Sepsis ([place]) 04/14/2019  . Hypocalcemia 04/08/2019  . Drug induced constipation   . Anemia of chronic disease   . ESRD on dialysis ([place])   . Postoperative pain   . Phantom limb pain ([place])   . S/P BKA (below knee amputation), left ([place]) 03/18/2019  . Unilateral complete BKA, left, initial encounter ([place]) 03/17/2019  . Critical lower limb ischemia ([place]) 03/12/2019  . Infection of amputation stump, right lower extremity ([place]) 03/10/2019  . Hypotension 02/23/2019  . Cellulitis of left foot 02/23/2019  . Gangrene of toe of left foot ([place]) 02/22/2019  . Complication of vascular dialysis catheter 11/24/2018  . Chronic osteomyelitis involving right ankle and foot ([place])   . Ulcer of right foot with fat layer exposed ([place]) 09/02/2018  . Encounter for planned post-operative wound closure   . Acute osteomyelitis of metatarsal bone of right foot ([place])   . Diabetic ulcer of midfoot associated with diabetes mellitus due to underlying condition, with necrosis of bone ([place])   . Osteomyelitis of right foot ([place])   . Vascular calcification   . Cellulitis and abscess of foot, except toes   . Anemia associated with chronic renal failure 07/13/2018  . Cardiomyopathy, dilated, nonischemic ([place]) 07/13/2018  . Diabetic foot infection ([place]) 07/13/2018  . PAD (peripheral artery disease) ([place])   .  Macular degeneration   . Ventricular tachycardia ([place]) 06/24/2017  . Embolism due to vascular prosthetic devices, implants and grafts, initial encounter ([place]) 02/04/2016  . Dependence on renal dialysis ([place]) 05/30/2015  . Shortness of breath 02/05/2015  . Chronic systolic heart failure ([place]) 08/22/2014  . Diabetic infection of right foot ([place]) 07/13/2014  . Type 2 diabetes mellitus with diabetic peripheral angiopathy without gangrene ([place]) 03/18/2014  . Anxiety disorder, unspecified 02/23/2014  . Fever, unspecified 02/23/2014  . Headache 02/23/2014  . Pain, unspecified 02/23/2014  . Pruritus, unspecified 02/23/2014  . Mild protein-calorie malnutrition ([place]) 12/26/2013  . ESRD (end stage renal disease) on dialysis ([place]) 11/11/2013  . Snoring 11/11/2013  . Unspecified sleep apnea 11/11/2013  . Other pancytopenia ([place]) 09/22/2013  . Hemoptysis 09/20/2013  . Personal history of nicotine dependence 07/12/2013  . Coagulation defect, unspecified ([place]) 05/16/2013  . Iron deficiency anemia, unspecified 05/06/2013  . Other disorders of plasma-protein metabolism, not elsewhere classified 04/15/2013  . 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 Other cardiomyopathies ([place]) 03/18/2013  . Secondary hyperparathyroidism  of renal origin ([place]) 03/18/2013  . Type 2 diabetes mellitus without complications ([place]) 90/38/3338  . Pre-transplant evaluation for kidney transplant 01/11/2013  . Automatic implantable cardioverter-defibrillator in situ 10/01/2010  . HYPERCHOLESTEROLEMIA, MIXED 05/03/2010  . Essential hypertension 05/03/2010  . CHF 05/03/2010    Jamey Reas  PT, DPT 11/01/2020, 2:02 PM  [facility] Physical Therapy [address] [place], [place], [address] Phone: [phone]   Fax:  [phone]  Name: MAY OZMENT MRN: [id number] Date of Birth: Apr 17, 1946

## 2020-11-02 DIAGNOSIS — D689 Coagulation defect, unspecified: Secondary | ICD-10-CM | POA: Diagnosis not present

## 2020-11-02 DIAGNOSIS — N186 End stage renal disease: Secondary | ICD-10-CM | POA: Diagnosis not present

## 2020-11-02 DIAGNOSIS — Z992 Dependence on renal dialysis: Secondary | ICD-10-CM | POA: Diagnosis not present

## 2020-11-02 DIAGNOSIS — N2581 Secondary hyperparathyroidism of renal origin: Secondary | ICD-10-CM | POA: Diagnosis not present

## 2020-11-05 DIAGNOSIS — D689 Coagulation defect, unspecified: Secondary | ICD-10-CM | POA: Diagnosis not present

## 2020-11-05 DIAGNOSIS — Z992 Dependence on renal dialysis: Secondary | ICD-10-CM | POA: Diagnosis not present

## 2020-11-05 DIAGNOSIS — N2581 Secondary hyperparathyroidism of renal origin: Secondary | ICD-10-CM | POA: Diagnosis not present

## 2020-11-05 DIAGNOSIS — N186 End stage renal disease: Secondary | ICD-10-CM | POA: Diagnosis not present

## 2020-11-06 ENCOUNTER — Ambulatory Visit (INDEPENDENT_AMBULATORY_CARE_PROVIDER_SITE_OTHER): Payer: Medicare Other | Admitting: Podiatry

## 2020-11-06 ENCOUNTER — Ambulatory Visit (INDEPENDENT_AMBULATORY_CARE_PROVIDER_SITE_OTHER): Payer: Medicare Other | Admitting: Physical Therapy

## 2020-11-06 ENCOUNTER — Encounter: Payer: Self-pay | Admitting: Physical Therapy

## 2020-11-06 ENCOUNTER — Other Ambulatory Visit: Payer: Self-pay

## 2020-11-06 DIAGNOSIS — R2689 Other abnormalities of gait and mobility: Secondary | ICD-10-CM

## 2020-11-06 DIAGNOSIS — M6281 Muscle weakness (generalized): Secondary | ICD-10-CM

## 2020-11-06 DIAGNOSIS — R2681 Unsteadiness on feet: Secondary | ICD-10-CM

## 2020-11-06 DIAGNOSIS — M25661 Stiffness of right knee, not elsewhere classified: Secondary | ICD-10-CM | POA: Diagnosis not present

## 2020-11-06 DIAGNOSIS — L97511 Non-pressure chronic ulcer of other part of right foot limited to breakdown of skin: Secondary | ICD-10-CM

## 2020-11-06 DIAGNOSIS — R293 Abnormal posture: Secondary | ICD-10-CM | POA: Diagnosis not present

## 2020-11-06 DIAGNOSIS — I739 Peripheral vascular disease, unspecified: Secondary | ICD-10-CM | POA: Diagnosis not present

## 2020-11-06 NOTE — Progress Notes (Signed)
  Subjective:  Patient ID: Timothy Faulkner, male    DOB: 1946-07-16,  MRN: [id number]  Chief Complaint  Patient presents with  . Diabetic Ulcer       3 week f/u  wound care right fourth toe    75 y.o. male presents for wound care. Hx confirmed with patient.  Thinks the sore is doing okay  Objective:  Physical Exam: Wound Location: right 2nd toe Wound Measurement: 0.3x0.3 Wound Base: Granular/Healthy Peri-wound: Normal Exudate: None: wound tissue dry wound without warmth, erythema, signs of acute infection    Assessment:   1. Skin ulcer of fourth toe, right, limited to breakdown of skin ([place])   2. PAD (peripheral artery disease) ([place])      Plan:  Patient was evaluated and treated and all questions answered.  Ulcer right second and fourth toe -Wound continues to be slow to heal but was without signs of acute infection on today -Continue Santyl -May need vascular follow-up if circulation appears worsened -Continue surgical shoe  No follow-ups on file.

## 2020-11-06 NOTE — Therapy (Signed)
[facility] Physical Therapy [address] [place], [place], [address] Phone: [phone]   Fax:  [phone]  Physical Therapy Treatment & 10th Visit Progress Note  Patient Details  Name: Timothy Faulkner MRN: [id number] Date of Birth: 10-Aug-1946 Referring Provider (PT): Monica Martinez, MD   Encounter Date: 11/06/2020   Progress Note Reporting Period 09/25/2020 to 11/06/2020  See note below for Objective Data and Assessment of Progress/Goals.        PT End of Session - 11/06/20 1440    Visit Number 10    Number of Visits 25    Date for PT Re-Evaluation 12/24/20    Authorization Type [employer] Medicare    Progress Note Due on Visit 20    PT Start Time 1430    PT Stop Time 1515    PT Time Calculation (min) 45 min    Equipment Utilized During Treatment Gait belt    Activity Tolerance Patient tolerated treatment well;Patient limited by fatigue    Behavior During Therapy WFL for tasks assessed/performed           Past Medical History:  Diagnosis Date  . Anemia   . Arthritis    Gout  . Automatic implantable cardioverter-defibrillator in situ    [employer]  . CHF   . ESRD on dialysis [facility])    [name] 03/11/2013 (03/11/2013) dialysis M/W/F  . Gangrene ([place])    left foot  . GERD (gastroesophageal reflux disease)   . Gout    "once a year"  . Heart murmur   . HYPERCHOLESTEROLEMIA, MIXED   . HYPERTENSION   . Macular degeneration   . Osteomyelitis ([place])    right foot  . 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 Pacemaker   . Peripheral arterial disease (HCC)    left fifth toe ulcer, healing  . Pneumonia   . Shortness of breath   . 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 Wears dentures   . Wears glasses     Past Surgical History:  Procedure Laterality Date  . A/V FISTULAGRAM Left 07/20/2018   Procedure: A/V FISTULAGRAM;  Surgeon: [name], MD;  Location: [place] [facility];  Service:  Cardiovascular;  Laterality: Left;  . A/V FISTULAGRAM N/A 07/15/2019   Procedure: A/V FISTULAGRAM - Left Arm;  Surgeon: Elam Dutch, MD;  Location: [place] [facility];  Service: Cardiovascular;  Laterality: N/A;  . ABDOMINAL AORTOGRAM N/A 02/25/2019   Procedure: ABDOMINAL AORTOGRAM;  Surgeon: Elam Dutch, MD;  Location: [place] [facility];  Service: Cardiovascular;  Laterality: N/A;  . ABDOMINAL AORTOGRAM W/LOWER EXTREMITY Bilateral 07/20/2018   Procedure: ABDOMINAL AORTOGRAM W/LOWER EXTREMITY;  Surgeon: [name], MD;  Location: [place] [facility];  Service: Cardiovascular;  Laterality: Bilateral;  . AMPUTATION Right 09/07/2018   Procedure: Right fifth metatarsectomy;  Surgeon: [name], DPM;  Location: [place];  Service: Podiatry;  Laterality: Right;  . AMPUTATION Left 03/08/2019   Procedure: AMPUTATION  3RD AND 4TH TOES LEFT FOOT;  Surgeon: [name], DPM;  Location: [place];  Service: Podiatry;  Laterality: Left;  . AMPUTATION Left 03/15/2019   Procedure: AMPUTATION BELOW KNEE;  Surgeon: Elam Dutch, MD;  Location: [facility] OR;  Service: Vascular;  Laterality: Left;  . AV FISTULA PLACEMENT Right 12/13/2012   Procedure: ARTERIOVENOUS (AV) FISTULA CREATION;  Surgeon: [name], MD;  Location: [place];  Service: Vascular;  Laterality: Right;  Ultrasound guided  . AV FISTULA PLACEMENT Left 05/07/2016   Procedure: LEFT RADIOCEPHALIC ARTERIOVENOUS (AV) FISTULA CREATION;  Surgeon: [name], MD;  Location: [facility] OR;  Service: Vascular;  Laterality: Left;  . AV FISTULA PLACEMENT Right 08/09/2019   Procedure: INSERTION OF RIGHT ARTERIOVENOUS (AV) 4-1mm GORE-TEX STRETCH GRAFT RIGHT  ARM;  Surgeon: Elam Dutch, MD;  Location: [place];  Service: Vascular;  Laterality: Right;  . BASCILIC VEIN TRANSPOSITION Right 03/26/2016   Procedure: RIGHT BASILIC VEIN TRANSPOSITION;  Surgeon: [name], MD;  Location: [place];  Service: Vascular;  Laterality: Right;  . CARDIAC CATHETERIZATION     . CARDIAC DEFIBRILLATOR PLACEMENT     Chemical engineer  . CATARACT EXTRACTION W/PHACO Bilateral   . EYE SURGERY Bilateral    Cataract  . FISTULOGRAM Left 04/22/2018   Procedure: FISTULOGRAM UPPER EXTREMITY;  Surgeon: [name], MD;  Location: [place];  Service: Vascular;  Laterality: Left;  . I & D EXTREMITY Right 07/15/2018   Procedure: IRRIGATION AND DEBRIDEMENT RIGHT FOOT;  Surgeon: [name], DPM;  Location: [place];  Service: Podiatry;  Laterality: Right;  . INCISION AND DRAINAGE ABSCESS / HEMATOMA OF BURSA / KNEE / THIGH Left 2012   "knee" (03/11/2013)  . INSERT / REPLACE / REMOVE PACEMAKER    . INSERTION OF DIALYSIS CATHETER Left 04/22/2018   Procedure: INSERTION OF DIALYSIS CATHETER;  Surgeon: [name], MD;  Location: [place];  Service: Vascular;  Laterality: Left;  . IR FLUORO GUIDE CV LINE LEFT  07/15/2018  . IR PTA VENOUS EXCEPT DIALYSIS CIRCUIT  07/15/2018  . LOWER EXTREMITY ANGIOGRAPHY Right 07/21/2018   Procedure: LOWER EXTREMITY ANGIOGRAPHY;  Surgeon: [name], MD;  Location: [place] [facility];  Service: Cardiovascular;  Laterality: Right;  . LOWER EXTREMITY ANGIOGRAPHY Bilateral 02/25/2019   Procedure: Lower Extremity Angiography;  Surgeon: Elam Dutch, MD;  Location: [place] [facility];  Service: Cardiovascular;  Laterality: Bilateral;  . METATARSAL HEAD EXCISION Right 07/15/2018   Procedure: METATARSAL RESECTION;  Surgeon: [name], DPM;  Location: [place];  Service: Podiatry;  Laterality: Right;  . MULTIPLE TOOTH EXTRACTIONS    . PERIPHERAL VASCULAR ATHERECTOMY Right 02/28/2019   Procedure: PERIPHERAL VASCULAR ATHERECTOMY;  Surgeon: Waynetta Sandy, MD;  Location: [place] [facility];  Service: Cardiovascular;  Laterality: Right;  right tp trunk   . PERIPHERAL VASCULAR BALLOON ANGIOPLASTY Left 02/25/2019   Procedure: PERIPHERAL VASCULAR BALLOON ANGIOPLASTY;  Surgeon: Elam Dutch, MD;  Location: [place] [facility];   Service: Cardiovascular;  Laterality: Left;  tibial peroneal trunk and PT  . PERIPHERAL VASCULAR INTERVENTION Right 07/21/2018   Procedure: PERIPHERAL VASCULAR INTERVENTION;  Surgeon: [name], MD;  Location: [place] [facility];  Service: Cardiovascular;  Laterality: Right;  peroneal stents  . REVISON OF ARTERIOVENOUS FISTULA Right 0/93/2671   Procedure: Plication OF Right Arm ARTERIOVENOUS FISTULA;  Surgeon: Elam Dutch, MD;  Location: [facility] OR;  Service: Vascular;  Laterality: Right;  . REVISON OF ARTERIOVENOUS FISTULA Left 04/22/2018   Procedure: REVISION OF RADIOCEPHALIC ARTERIOVENOUS FISTULA;  Surgeon: [name], MD;  Location: [place];  Service: Vascular;  Laterality: Left;  . SHUNTOGRAM N/A 05/31/2013   Procedure: Earney Mallet;  Surgeon: [name], MD;  Location: [facility] CATH LAB;  Service: Cardiovascular;  Laterality: N/A;  . UPPER EXTREMITY VENOGRAPHY Right 07/23/2018   Procedure: UPPER EXTREMITY VENOGRAPHY;  Surgeon: [name], MD;  Location: [place] [facility];  Service: Cardiovascular;  Laterality: Right;  . UPPER EXTREMITY VENOGRAPHY  07/15/2019   Procedure: UPPER EXTREMITY VENOGRAPHY;  Surgeon: Elam Dutch, MD;  Location: [place] [facility];  Service: Cardiovascular;;  rt arm   . WOUND DEBRIDEMENT Right 07/17/2018   Procedure: Wound Debridement; Closure Filleted toe flap Right Foot;  Surgeon: [name], DPM;  Location: [place];  Service: Podiatry;  Laterality: Right;  . WOUND DEBRIDEMENT Right 09/07/2018   Procedure: Debridement Right Foot Wound, application of wound vac;  Surgeon: [name], DPM;  Location: [place];  Service: Podiatry;  Laterality: Right;  . WOUND DEBRIDEMENT Right 03/08/2019   Procedure: DEBRIDEMENT WOUND RIGHT FOOT;  Surgeon: [name], DPM;  Location: [place];  Service: Podiatry;  Laterality: Right;    There were no vitals filed for this visit.   Subjective Assessment - 11/06/20 1428    Subjective No pain.  On nondialysis he is wearing prosthesis most of awake hours. On dialysis days he sleeps most of day after dialysis.    Pertinent History PAD, DM2, arthritis, gout, CHF, implanted cardioverter-defib, HTN, CKD w/ ESRD    Patient Stated Goals to use prosthesis to walk in community,    Currently in Pain? No/denies                             [facility] Adult PT Treatment/Exercise - 11/06/20 1430      Transfers   Transfers Sit to Stand;Stand to Sit;Squat Pivot Transfers    Sit to Stand 5: Supervision;With upper extremity assist;With armrests;From chair/3-in-1;Other (comment)   requires RW to stabilize   Stand to Sit 5: Supervision;With upper extremity assist;With armrests;To chair/3-in-1;Other (comment)   requires RW for stability     Ambulation/Gait   Ambulation/Gait Yes    Ambulation/Gait Assistance 5: Supervision    Ambulation/Gait Assistance Details verbal & visual cues on upright posture looking forward / not staring at floor, step width & wt shift over stance limb.    Ambulation Distance (Feet) 50 Feet    Assistive device Rolling walker;Prosthesis    Ambulation Surface Level;Indoor    Ramp 3: Mod assist   RW & prosthesis   Ramp Details (indicate cue type and reason) verbal & manual cues on technique    Curb 4: Min assist   RW & prosthesis   Curb Details (indicate cue type and reason) verbal & manual cues on technique      Neuro Re-ed    Neuro Re-ed Details  standing with minA - red theraband alternating UEs & BUEs rows & forward punch 10 reps ea PT manual cues for stabilization      Knee/Hip Exercises: Stretches   Passive Hamstring Stretch Right;Left;2 reps;60 seconds    Passive Hamstring Stretch Limitations seated with foot in chair in front.      Knee/Hip Exercises: Aerobic   Nustep seat 11 Level 5 for 8 min      Knee/Hip Exercises: Machines for Strengthening   Cybex Leg Press BLEs 75# 10 reps 3 sets      Prosthetics   Prosthetic Care Comments  PT reviewed  donning technique. PT verbal cues on signs of sweating & need to dry limb / liner.  PT recommended checking limb for sweating every 4-5 hrs of wear.    Current prosthetic wear tolerance (days/week)  nondialysis days daily,    Current prosthetic wear tolerance (#hours/day)  6 hrs 1x/day    Edema pitting edema  Residual limb condition  No open areas, dry skin, normal temperature, normal color, cylinerical shape    Education Provided Residual limb care;Correct ply sock adjustment;Proper wear schedule/adjustment;Proper weight-bearing schedule/adjustment;Other (comment)   see prosthetic care comments   Person(s) Educated Patient;Child(ren)    Education Method Explanation;Verbal cues;Demonstration;Tactile cues    Education Method Verbalized understanding;Returned demonstration;Tactile cues required;Verbal cues required;Needs further instruction    Donning Prosthesis Supervision                    PT Short Term Goals - 10/30/20 1546      PT SHORT TERM GOAL #1   Title Patient verbalizes proper adjustment of ply socks.    Time 4    Period Weeks    Status New    Target Date 11/29/20      PT SHORT TERM GOAL #2   Title Patient tolerates prosthesis wear >12hrs total on nondialysis days & >3hrs after dialysis if out of bed.    Time 4    Period Weeks    Status Revised    Target Date 11/29/20      PT SHORT TERM GOAL #3   Title Patient standing balance with RW & prosthesis reaching 7" anteriorly & within 10" of floor with supervision.    Time 4    Period Weeks    Status On-going    Target Date 11/29/20      PT SHORT TERM GOAL #4   Title Patient ambulates 125' with RW & prosthesis with supervision.    Time 4    Period Weeks    Status Revised    Target Date 11/29/20      PT SHORT TERM GOAL #5   Title Patient negotiates ramps & curbs with RW & prostheis with modA    Time 4    Period Weeks    Status New    Target Date 11/29/20             PT Long Term Goals - 10/30/20  1541      PT LONG TERM GOAL #1   Title Patient & family verbalize & demonstrate understanding of proper prosthetic care to enable utilization of prosthesis.    Time 12    Period Weeks    Status On-going    Target Date 12/20/20      PT LONG TERM GOAL #2   Title Patient tolerates wear of prosthesis >90% of awake hours without skin or limb discomfort issues.    Time 12    Period Weeks    Status On-going    Target Date 12/20/20      PT LONG TERM GOAL #3   Title Berg Balance >/= 36/56    Time 12    Period Weeks    Status On-going    Target Date 12/20/20      PT LONG TERM GOAL #4   Title Patient ambulates 200' with RW & prosthesis modified independent.    Time 12    Period Weeks    Status On-going    Target Date 12/20/20      PT LONG TERM GOAL #5   Title Patient negotiates ramps & curbs with RW & prosthesis with minA.    Time 12    Period Weeks    Status On-going    Target Date 12/20/20                 Plan - 11/06/20 1441    Clinical Impression Statement Mr. Sebring  continues to progress well with his prosthetic care, wear on non-dialysis days and mobility with walker.  He continues to have deficits in strength, flexiblity & balance from prolonged immobiity with amputation and dialysis. However these deficits are slowly improving with PT.  He is on target to meet STGs set for this 30 day period.    Personal Factors and Comorbidities Comorbidity 3+;Fitness;Time since onset of injury/illness/exacerbation    Comorbidities PAD, DM2, arthritis, gout, CHF, implanted cardioverter-defib, HTN, CKD w/ ESRD, blind left knee    Examination-Activity Limitations Locomotion Level;Stairs;Stand;Transfers    Examination-Participation Restrictions Community Activity    Stability/Clinical Decision Making Evolving/Moderate complexity    Rehab Potential Good    PT Frequency 2x / week    PT Duration 12 weeks    PT Treatment/Interventions ADLs/Self Care Home Management;DME Instruction;Gait  training;Stair training;Functional mobility training;Therapeutic activities;Therapeutic exercise;Balance training;Neuromuscular re-education;Patient/family education;Prosthetic Training;Manual techniques;Vestibular    PT Next Visit Plan review prosthetic care,  prosthetic gait with RW including ramps & curbs,  exercise for strength, balance, flexibility & endurance.    Consulted and Agree with Plan of Care Patient;Family member/caregiver    Family Member Consulted daughter,Sherry Cira Servant           Patient will benefit from skilled therapeutic intervention in order to improve the following deficits and impairments:  Abnormal gait,Cardiopulmonary status limiting activity,Decreased activity tolerance,Decreased balance,Decreased endurance,Decreased knowledge of use of DME,Decreased mobility,Decreased range of motion,Decreased scar mobility,Decreased strength,Increased edema,Postural dysfunction,Prosthetic Dependency  Visit Diagnosis: Unsteadiness on feet  Other abnormalities of gait and mobility  Muscle weakness (generalized)  Stiffness of right knee, not elsewhere classified  Abnormal posture     Problem List Patient Active Problem List   Diagnosis Date Noted  . Polycythemia, secondary 02/21/2020  . Severe nonproliferative diabetic retinopathy of right eye without macular edema associated with type 2 diabetes mellitus ([place]) 11/29/2019  . Severe nonproliferative diabetic retinopathy of left eye without macular edema associated with type 2 diabetes mellitus ([place]) 11/29/2019  . Advanced nonexudative age-related macular degeneration of both eyes with subfoveal involvement 11/29/2019  . 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  . Sepsis ([place]) 04/14/2019  . Hypocalcemia 04/08/2019  . Drug induced constipation   . Anemia of chronic disease   . ESRD on dialysis ([place])   . Postoperative pain   . Phantom limb pain ([place])   . S/P BKA (below knee amputation), left ([place]) 03/18/2019   . Unilateral complete BKA, left, initial encounter ([place]) 03/17/2019  . Critical lower limb ischemia ([place]) 03/12/2019  . Infection of amputation stump, right lower extremity ([place]) 03/10/2019  . Hypotension 02/23/2019  . Cellulitis of left foot 02/23/2019  . Gangrene of toe of left foot ([place]) 02/22/2019  . Complication of vascular dialysis catheter 11/24/2018  . Chronic osteomyelitis involving right ankle and foot ([place])   . Ulcer of right foot with fat layer exposed ([place]) 09/02/2018  . Encounter for planned post-operative wound closure   . Acute osteomyelitis of metatarsal bone of right foot ([place])   . Diabetic ulcer of midfoot associated with diabetes mellitus due to underlying condition, with necrosis of bone ([place])   . Osteomyelitis of right foot ([place])   . Vascular calcification   . Cellulitis and abscess of foot, except toes   . Anemia associated with chronic renal failure 07/13/2018  . Cardiomyopathy, dilated, nonischemic ([place]) 07/13/2018  . Diabetic foot infection ([place]) 07/13/2018  . PAD (peripheral artery disease) ([place])   . Macular degeneration   . Ventricular tachycardia ([place]) 06/24/2017  .  Embolism due to vascular prosthetic devices, implants and grafts, initial encounter ([place]) 02/04/2016  . Dependence on renal dialysis ([place]) 05/30/2015  . Shortness of breath 02/05/2015  . Chronic systolic heart failure ([place]) 08/22/2014  . Diabetic infection of right foot ([place]) 07/13/2014  . Type 2 diabetes mellitus with diabetic peripheral angiopathy without gangrene ([place]) 03/18/2014  . Anxiety disorder, unspecified 02/23/2014  . Fever, unspecified 02/23/2014  . Headache 02/23/2014  . Pain, unspecified 02/23/2014  . Pruritus, unspecified 02/23/2014  . Mild protein-calorie malnutrition ([place]) 12/26/2013  . ESRD (end stage renal disease) on dialysis ([place]) 11/11/2013  . Snoring 11/11/2013  . Unspecified sleep apnea 11/11/2013  . Other pancytopenia ([place]) 09/22/2013  . Hemoptysis 09/20/2013  .  Personal history of nicotine dependence 07/12/2013  . Coagulation defect, unspecified ([place]) 05/16/2013  . Iron deficiency anemia, unspecified 05/06/2013  . Other disorders of plasma-protein metabolism, not elsewhere classified 04/15/2013  . 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 chronic kidney disease or end stage renal disease ([place]) 03/18/2013  . Other cardiomyopathies ([place]) 03/18/2013  . Secondary hyperparathyroidism of renal origin ([place]) 03/18/2013  . Type 2 diabetes mellitus without complications ([place]) 95/32/0233  . Pre-transplant evaluation for kidney transplant 01/11/2013  . Automatic implantable cardioverter-defibrillator in situ 10/01/2010  . HYPERCHOLESTEROLEMIA, MIXED 05/03/2010  . Essential hypertension 05/03/2010  . CHF 05/03/2010    Jamey Reas, PT, DPT 11/06/2020, 4:46 PM  [facility] Physical Therapy [address] [place], [place], [address] Phone: [phone]   Fax:  [phone]  Name: JERVON REAM MRN: [id number] Date of Birth: 1946/04/10

## 2020-11-07 DIAGNOSIS — N2581 Secondary hyperparathyroidism of renal origin: Secondary | ICD-10-CM | POA: Diagnosis not present

## 2020-11-07 DIAGNOSIS — D689 Coagulation defect, unspecified: Secondary | ICD-10-CM | POA: Diagnosis not present

## 2020-11-07 DIAGNOSIS — N186 End stage renal disease: Secondary | ICD-10-CM | POA: Diagnosis not present

## 2020-11-07 DIAGNOSIS — Z992 Dependence on renal dialysis: Secondary | ICD-10-CM | POA: Diagnosis not present

## 2020-11-08 ENCOUNTER — Ambulatory Visit (INDEPENDENT_AMBULATORY_CARE_PROVIDER_SITE_OTHER): Payer: Medicare Other | Admitting: Ophthalmology

## 2020-11-08 ENCOUNTER — Encounter (INDEPENDENT_AMBULATORY_CARE_PROVIDER_SITE_OTHER): Payer: Self-pay | Admitting: Ophthalmology

## 2020-11-08 ENCOUNTER — Other Ambulatory Visit: Payer: Self-pay

## 2020-11-08 DIAGNOSIS — E113491 Type 2 diabetes mellitus with severe nonproliferative diabetic retinopathy without macular edema, right eye: Secondary | ICD-10-CM

## 2020-11-08 DIAGNOSIS — I12 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 chronic kidney disease or end stage renal disease: Secondary | ICD-10-CM | POA: Diagnosis not present

## 2020-11-08 DIAGNOSIS — E113551 Type 2 diabetes mellitus with stable proliferative diabetic retinopathy, right eye: Secondary | ICD-10-CM

## 2020-11-08 DIAGNOSIS — E113552 Type 2 diabetes mellitus with stable proliferative diabetic retinopathy, left eye: Secondary | ICD-10-CM | POA: Diagnosis not present

## 2020-11-08 DIAGNOSIS — N186 End stage renal disease: Secondary | ICD-10-CM | POA: Diagnosis not present

## 2020-11-08 DIAGNOSIS — Z992 Dependence on renal dialysis: Secondary | ICD-10-CM | POA: Diagnosis not present

## 2020-11-08 NOTE — Assessment & Plan Note (Signed)

## 2020-11-08 NOTE — Progress Notes (Signed)
11/08/2020     CHIEF COMPLAINT Patient presents for Retina Follow Up (6 Month diabetic f\u OU. FP/Pt states vision has been about the same. States vision may be slightly better./BGL: 122 yesterday/A1C: 4.7?)   HISTORY OF PRESENT ILLNESS: Timothy Faulkner is a 75 y.o. male who presents to the clinic today for:   HPI    Retina Follow Up    Patient presents with  Diabetic Retinopathy.  In right eye.  Severity is severe.  Duration of 6 months.  Since onset it is stable.  I, the attending physician,  performed the HPI with the patient and updated documentation appropriately. Additional comments: 6 Month diabetic f\u OU. FP Pt states vision has been about the same. States vision may be slightly better. BGL: 122 yesterday A1C: 4.7?       Last edited by Tilda Franco on 11/08/2020  2:59 PM. (History)      Referring physician: [name], MD [phone],  [place] [address]  HISTORICAL INFORMATION:   Selected notes from the MEDICAL RECORD NUMBER    Lab Results  Component Value Date   HGBA1C 5.2 03/12/2019     CURRENT MEDICATIONS: No current outpatient medications on file. (Ophthalmic Drugs)   No current facility-administered medications for this visit. (Ophthalmic Drugs)   Current Outpatient Medications (Other)  Medication Sig  . acetaminophen (TYLENOL) 500 MG tablet Take 500 mg by mouth every 6 (six) hours as needed for mild pain or headache.   . Amino Acids-Protein Hydrolys (FEEDING SUPPLEMENT, PRO-STAT SUGAR FREE 64,) LIQD Take 30 mLs by mouth 2 (two) times daily.  Lorin Picket 1 GM 210 MG(Fe) tablet Take 210 mg by mouth 3 (three) times daily.   . bisacodyl (DULCOLAX) 5 MG EC tablet Take 1 tablet (5 mg total) by mouth daily as needed for moderate constipation.  . calcitRIOL (ROCALTROL) 0.25 MCG capsule Take 0.25 mcg by mouth every Monday, Wednesday, and Friday.  . Calcium Carbonate Antacid (CALCIUM CARBONATE, DOSED IN MG ELEMENTAL CALCIUM,) 1250  MG/5ML SUSP Take 5 mLs (500 mg of elemental calcium total) by mouth every 6 (six) hours as needed for indigestion.  . cinacalcet (SENSIPAR) 30 MG tablet Take 1 tablet (30 mg total) by mouth every other day.  Marland Kitchen CLONIDINE HCL PO Take by mouth.  . clopidogrel (PLAVIX) 75 MG tablet Take 1 tablet (75 mg total) by mouth daily with breakfast.  . colchicine 0.6 MG tablet Take 0.6 mg by mouth daily as needed (as directed for gout flares).   . collagenase (SANTYL) ointment Apply topically daily. Wound measures 0.5x0.5. Apply to wound - edge to edge nickel thick.  Cover with wet gauze followed by dry gauze and change daily  . docusate sodium (COLACE) 100 MG capsule Take 2 capsules (200 mg total) by mouth 2 (two) times daily.  . heparin 1000 unit/mL SOLN injection Heparin Sodium (Porcine) 1,000 Units/mL Systemic  . HYDROcodone-acetaminophen (NORCO) 5-325 MG tablet Take 1 tablet by mouth every 6 (six) hours as needed for moderate pain.  Marland Kitchen lidocaine-prilocaine (EMLA) cream SMARTSIG:Sparingly Topical  . methocarbamol (ROBAXIN) 500 MG tablet Take 1 tablet (500 mg total) by mouth every 12 (twelve) hours as needed for muscle spasms.  . midodrine (PROAMATINE) 10 MG tablet Take 1 tablet (10 mg total) by mouth every Monday, Wednesday, and Friday with hemodialysis.  Marland Kitchen oxyCODONE (OXY IR/ROXICODONE) 5 MG immediate release tablet Take by mouth.   . RENVELA 800 MG tablet Take 2 tablets by  mouth in the morning, at noon, and at bedtime.  . rosuvastatin (CRESTOR) 10 MG tablet Take 1 tablet (10 mg total) by mouth daily at 6 PM.  . silver sulfADIAZINE (SILVADENE) 1 % cream Apply pea-sized amount to wound daily.   No current facility-administered medications for this visit. (Other)      REVIEW OF SYSTEMS:    ALLERGIES No Known Allergies  PAST MEDICAL HISTORY Past Medical History:  Diagnosis Date  . Anemia   . Arthritis    Gout  . Automatic implantable cardioverter-defibrillator in situ    [employer]  .  CHF   . ESRD on dialysis [facility])    [name] 03/11/2013 (03/11/2013) dialysis M/W/F  . Gangrene ([place])    left foot  . GERD (gastroesophageal reflux disease)   . Gout    "once a year"  . Heart murmur   . HYPERCHOLESTEROLEMIA, MIXED   . HYPERTENSION   . Macular degeneration   . Osteomyelitis ([place])    right foot  . 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 Pacemaker   . Peripheral arterial disease (HCC)    left fifth toe ulcer, healing  . Pneumonia   . Shortness of breath   . 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 Wears dentures   . Wears glasses    Past Surgical History:  Procedure Laterality Date  . A/V FISTULAGRAM Left 07/20/2018   Procedure: A/V FISTULAGRAM;  Surgeon: [name], MD;  Location: [place] [facility];  Service: Cardiovascular;  Laterality: Left;  . A/V FISTULAGRAM N/A 07/15/2019   Procedure: A/V FISTULAGRAM - Left Arm;  Surgeon: Elam Dutch, MD;  Location: [place] [facility];  Service: Cardiovascular;  Laterality: N/A;  . ABDOMINAL AORTOGRAM N/A 02/25/2019   Procedure: ABDOMINAL AORTOGRAM;  Surgeon: Elam Dutch, MD;  Location: [place] [facility];  Service: Cardiovascular;  Laterality: N/A;  . ABDOMINAL AORTOGRAM W/LOWER EXTREMITY Bilateral 07/20/2018   Procedure: ABDOMINAL AORTOGRAM W/LOWER EXTREMITY;  Surgeon: [name], MD;  Location: [place] [facility];  Service: Cardiovascular;  Laterality: Bilateral;  . AMPUTATION Right 09/07/2018   Procedure: Right fifth metatarsectomy;  Surgeon: [name], DPM;  Location: [place];  Service: Podiatry;  Laterality: Right;  . AMPUTATION Left 03/08/2019   Procedure: AMPUTATION  3RD AND 4TH TOES LEFT FOOT;  Surgeon: [name], DPM;  Location: [place];  Service: Podiatry;  Laterality: Left;  . AMPUTATION Left 03/15/2019   Procedure: AMPUTATION BELOW KNEE;  Surgeon: Elam Dutch, MD;  Location: [facility] OR;  Service: Vascular;  Laterality: Left;  . AV FISTULA  PLACEMENT Right 12/13/2012   Procedure: ARTERIOVENOUS (AV) FISTULA CREATION;  Surgeon: [name], MD;  Location: [place];  Service: Vascular;  Laterality: Right;  Ultrasound guided  . AV FISTULA PLACEMENT Left 05/07/2016   Procedure: LEFT RADIOCEPHALIC ARTERIOVENOUS (AV) FISTULA CREATION;  Surgeon: [name], MD;  Location: [facility] OR;  Service: Vascular;  Laterality: Left;  . AV FISTULA PLACEMENT Right 08/09/2019   Procedure: INSERTION OF RIGHT ARTERIOVENOUS (AV) 4-46mm GORE-TEX STRETCH GRAFT RIGHT  ARM;  Surgeon: Elam Dutch, MD;  Location: [place];  Service: Vascular;  Laterality: Right;  . BASCILIC VEIN TRANSPOSITION Right 03/26/2016   Procedure: RIGHT BASILIC VEIN TRANSPOSITION;  Surgeon: [name], MD;  Location: [place];  Service: Vascular;  Laterality: Right;  . CARDIAC CATHETERIZATION    . CARDIAC DEFIBRILLATOR PLACEMENT  [employer]  . CATARACT EXTRACTION W/PHACO Bilateral   . EYE SURGERY Bilateral    Cataract  . FISTULOGRAM Left 04/22/2018   Procedure: FISTULOGRAM UPPER EXTREMITY;  Surgeon: [name], MD;  Location: [place];  Service: Vascular;  Laterality: Left;  . I & D EXTREMITY Right 07/15/2018   Procedure: IRRIGATION AND DEBRIDEMENT RIGHT FOOT;  Surgeon: [name], DPM;  Location: [place];  Service: Podiatry;  Laterality: Right;  . INCISION AND DRAINAGE ABSCESS / HEMATOMA OF BURSA / KNEE / THIGH Left 2012   "knee" (03/11/2013)  . INSERT / REPLACE / REMOVE PACEMAKER    . INSERTION OF DIALYSIS CATHETER Left 04/22/2018   Procedure: INSERTION OF DIALYSIS CATHETER;  Surgeon: [name], MD;  Location: [place];  Service: Vascular;  Laterality: Left;  . IR FLUORO GUIDE CV LINE LEFT  07/15/2018  . IR PTA VENOUS EXCEPT DIALYSIS CIRCUIT  07/15/2018  . LOWER EXTREMITY ANGIOGRAPHY Right 07/21/2018   Procedure: LOWER EXTREMITY ANGIOGRAPHY;  Surgeon: [name], MD;  Location: [place] [facility];  Service: Cardiovascular;  Laterality: Right;  . LOWER  EXTREMITY ANGIOGRAPHY Bilateral 02/25/2019   Procedure: Lower Extremity Angiography;  Surgeon: Elam Dutch, MD;  Location: [place] [facility];  Service: Cardiovascular;  Laterality: Bilateral;  . METATARSAL HEAD EXCISION Right 07/15/2018   Procedure: METATARSAL RESECTION;  Surgeon: [name], DPM;  Location: [place];  Service: Podiatry;  Laterality: Right;  . MULTIPLE TOOTH EXTRACTIONS    . PERIPHERAL VASCULAR ATHERECTOMY Right 02/28/2019   Procedure: PERIPHERAL VASCULAR ATHERECTOMY;  Surgeon: Waynetta Sandy, MD;  Location: [place] [facility];  Service: Cardiovascular;  Laterality: Right;  right tp trunk   . PERIPHERAL VASCULAR BALLOON ANGIOPLASTY Left 02/25/2019   Procedure: PERIPHERAL VASCULAR BALLOON ANGIOPLASTY;  Surgeon: Elam Dutch, MD;  Location: [place] [facility];  Service: Cardiovascular;  Laterality: Left;  tibial peroneal trunk and PT  . PERIPHERAL VASCULAR INTERVENTION Right 07/21/2018   Procedure: PERIPHERAL VASCULAR INTERVENTION;  Surgeon: [name], MD;  Location: [place] [facility];  Service: Cardiovascular;  Laterality: Right;  peroneal stents  . REVISON OF ARTERIOVENOUS FISTULA Right 7/34/2876   Procedure: Plication OF Right Arm ARTERIOVENOUS FISTULA;  Surgeon: Elam Dutch, MD;  Location: [facility] OR;  Service: Vascular;  Laterality: Right;  . REVISON OF ARTERIOVENOUS FISTULA Left 04/22/2018   Procedure: REVISION OF RADIOCEPHALIC ARTERIOVENOUS FISTULA;  Surgeon: [name], MD;  Location: [place];  Service: Vascular;  Laterality: Left;  . SHUNTOGRAM N/A 05/31/2013   Procedure: Earney Mallet;  Surgeon: [name], MD;  Location: [place] [facility];  Service: Cardiovascular;  Laterality: N/A;  . UPPER EXTREMITY VENOGRAPHY Right 07/23/2018   Procedure: UPPER EXTREMITY VENOGRAPHY;  Surgeon: [name], MD;  Location: [place] [facility];  Service: Cardiovascular;  Laterality: Right;  . UPPER EXTREMITY VENOGRAPHY  07/15/2019   Procedure:  UPPER EXTREMITY VENOGRAPHY;  Surgeon: Elam Dutch, MD;  Location: [place] [facility];  Service: Cardiovascular;;  rt arm   . WOUND DEBRIDEMENT Right 07/17/2018   Procedure: Wound Debridement; Closure Filleted toe flap Right Foot;  Surgeon: [name], DPM;  Location: [place];  Service: Podiatry;  Laterality: Right;  . WOUND DEBRIDEMENT Right 09/07/2018   Procedure: Debridement Right Foot Wound, application of wound vac;  Surgeon: [name], DPM;  Location: [place];  Service: Podiatry;  Laterality: Right;  . WOUND DEBRIDEMENT Right 03/08/2019   Procedure: DEBRIDEMENT WOUND RIGHT FOOT;  Surgeon: [name], DPM;  Location: [place];  Service: Podiatry;  Laterality: Right;    FAMILY HISTORY Family History  Problem Relation Age of Onset  . Heart disease Father   . CAD Father     SOCIAL HISTORY Social History   Tobacco Use  . Smoking status: Former Smoker    Packs/day: 0.75    Years: 30.00    Pack years: 22.50    Types: Cigarettes    Quit date: 11/25/1992    Years since quitting: 27.9  . Smokeless tobacco: Never Used  Vaping Use  . Vaping Use: Never used  Substance Use Topics  . Alcohol use: Not Currently  . Drug use: Not Currently    Types: Marijuana    Comment: last use 10 years ago         OPHTHALMIC EXAM:  Base Eye Exam    Visual Acuity (Snellen - Linear)      Right Left   Dist cc 20/400 20/400   Dist ph cc NI NI   Correction: Glasses       Tonometry (Tonopen, 3:04 PM)      Right Left   Pressure 14 13       Pupils      Pupils Dark Light Shape React APD   Right PERRL 4 4 Round Minimal None   Left PERRL 3 3 Round Minimal None       Visual Fields (Counting fingers)      Left Right    Full Full       Neuro/Psych    Oriented x3: Yes   Mood/Affect: Normal       Dilation    Both eyes: 1.0% Mydriacyl, 2.5% Phenylephrine @ 3:04 PM        Slit Lamp and Fundus Exam    External Exam      Right Left   External Normal Normal       Slit  Lamp Exam      Right Left   Lids/Lashes Normal Normal   Conjunctiva/Sclera White and quiet White and quiet   Cornea Clear Clear   Anterior Chamber Deep and quiet Deep and quiet   Iris Round and reactive Round and reactive   Lens Posterior chamber intraocular lens Posterior chamber intraocular lens   Anterior Vitreous Normal Normal       Fundus Exam      Right Left   Posterior Vitreous Central vitreous floaters, Vitreous syneresis Central vitreous floaters, Vitreous syneresis   Disc Normal Normal   C/D Ratio 0.35 0.35   Macula Central  and atrophy pigmentation, macula,retinal atrophy, Mottling, Retinal pigment epithelial mottling, Atrophy, Macular atrophy Central pigmentation, macula, outer retinal atrophy, Mottling, Retinal pigment epithelial mottling, Atrophy, Macular atrophy   Vessels Severe NPDR Severe NPDR   Periphery good PRP, no active NVE good PRP, no active NVE          IMAGING AND PROCEDURES  Imaging and Procedures for 11/08/20  Color Fundus Photography Optos - OU - Both Eyes       Right Eye Progression has been stable. Disc findings include normal observations. Macula : geographic atrophy, microaneurysms.   Left Eye Progression has been stable. Disc findings include normal observations. Macula : microaneurysms.   Notes OD, good PRP peripherally.  Some vitreous debris, no vitreous hemorrhage  OS medial opacity, does not correlate however with clinical findings on examination thus there is no vitreous hemorrhage in the left eye.  Good PRP present peripherally.  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 The patient was advised to maintain good glucose, blood pressure, monitor kidney function and  serum lipid control as advised by personal physician. Rare risk for reactivation of progression exist with untreated severe anemia, untreated renal failure, untreated heart failure, and smoking. Complete avoidance of smoking was recommended. The chance of recurrent proliferative diabetic retinopathy was discussed as well as the chance of vitreous hemorrhage for which further treatments may be necessary.   Explained to the patient that the quiescent  proliferative diabetic retinopathy disease is unlikely to ever worsen.  Worsening factors would include however severe anemia, hypertension out-of-control or impending renal failure.  Stable treated proliferative diabetic retinopathy of right eye determined by examination associated with type 2 diabetes mellitus ([place]) The nature of regressed proliferative diabetic retinopathy was discussed with the patient. The patient was advised to maintain good glucose, blood pressure, monitor kidney function and serum lipid control as advised by personal physician. Rare risk for reactivation of progression exist with untreated severe anemia, untreated renal failure, untreated heart failure, and smoking. Complete avoidance of smoking was recommended. The chance of recurrent proliferative diabetic retinopathy was discussed as well as the chance of vitreous hemorrhage for which further treatments may be necessary.   Explained to the patient that the quiescent  proliferative diabetic retinopathy disease is unlikely to ever worsen.  Worsening factors would include however severe anemia, hypertension out-of-control or impending renal failure.      ICD-10-CM   1. Severe nonproliferative diabetic retinopathy of right eye without macular edema associated with type 2 diabetes mellitus (HCC)  G95.6213 Color Fundus Photography Optos - OU - Both Eyes  2. 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 Stable treated proliferative diabetic retinopathy of right eye determined by examination associated with type 2 diabetes mellitus ([place])  O96.2952     1.  Quiescent proliferative diabetic retinopathy in each eye.  Vision is limited by previous macular nonperfusion deficits.  No active maculopathy.,  Good PRP in each eye protect the eye from progression  2.  3.  Ophthalmic Meds Ordered this visit:  No orders of the defined types were placed in this encounter.      Return in about 6 months (around 05/10/2021) for DILATE OU, COLOR FP.  There are no Patient Instructions on file for this visit.   Explained the diagnoses, plan, and follow up with the patient and they expressed understanding.  Patient expressed understanding of the importance of proper follow up care.   Clent Demark Shaquia Berkley M.D. Diseases & Surgery of the Retina and Vitreous Retina & Diabetic [place] 11/08/20     Abbreviations: M myopia (nearsighted); A astigmatism; H hyperopia (farsighted); P presbyopia; Mrx spectacle prescription;  CTL contact lenses; OD right eye; OS left eye; OU both eyes  XT exotropia; ET esotropia; PEK punctate epithelial keratitis; PEE punctate epithelial erosions; DES dry eye syndrome; MGD meibomian gland dysfunction; ATs artificial tears; PFAT's preservative free artificial tears; [place] nuclear sclerotic cataract; PSC posterior subcapsular cataract; ERM epi-retinal membrane; PVD posterior vitreous detachment; RD retinal detachment; DM diabetes mellitus; DR diabetic retinopathy; NPDR  non-proliferative diabetic retinopathy; PDR proliferative diabetic retinopathy; CSME clinically significant macular edema; DME diabetic macular edema; dbh dot blot hemorrhages; CWS cotton wool spot; POAG primary open angle glaucoma; C/D cup-to-disc ratio; HVF humphrey visual field; GVF goldmann visual field; OCT optical coherence tomography; IOP intraocular pressure; BRVO Branch retinal  vein occlusion; CRVO central retinal vein occlusion; CRAO central retinal artery occlusion; BRAO branch retinal artery occlusion; RT retinal tear; SB scleral buckle; PPV pars plana vitrectomy; VH Vitreous hemorrhage; PRP panretinal laser photocoagulation; IVK intravitreal kenalog; VMT vitreomacular traction; MH Macular hole;  NVD neovascularization of the disc; NVE neovascularization elsewhere; AREDS age related eye disease study; ARMD age related macular degeneration; POAG primary open angle glaucoma; EBMD epithelial/anterior basement membrane dystrophy; ACIOL anterior chamber intraocular lens; IOL intraocular lens; PCIOL posterior chamber intraocular lens; Phaco/IOL phacoemulsification with intraocular lens placement; [place] photorefractive keratectomy; LASIK laser assisted in situ keratomileusis; HTN hypertension; DM diabetes mellitus; COPD chronic obstructive pulmonary disease

## 2020-11-09 DIAGNOSIS — Z992 Dependence on renal dialysis: Secondary | ICD-10-CM | POA: Diagnosis not present

## 2020-11-09 DIAGNOSIS — D689 Coagulation defect, unspecified: Secondary | ICD-10-CM | POA: Diagnosis not present

## 2020-11-09 DIAGNOSIS — N186 End stage renal disease: Secondary | ICD-10-CM | POA: Diagnosis not present

## 2020-11-09 DIAGNOSIS — N2581 Secondary hyperparathyroidism of renal origin: Secondary | ICD-10-CM | POA: Diagnosis not present

## 2020-11-12 DIAGNOSIS — N2581 Secondary hyperparathyroidism of renal origin: Secondary | ICD-10-CM | POA: Diagnosis not present

## 2020-11-12 DIAGNOSIS — N186 End stage renal disease: Secondary | ICD-10-CM | POA: Diagnosis not present

## 2020-11-12 DIAGNOSIS — D689 Coagulation defect, unspecified: Secondary | ICD-10-CM | POA: Diagnosis not present

## 2020-11-12 DIAGNOSIS — Z992 Dependence on renal dialysis: Secondary | ICD-10-CM | POA: Diagnosis not present

## 2020-11-13 ENCOUNTER — Other Ambulatory Visit: Payer: Self-pay

## 2020-11-13 ENCOUNTER — Ambulatory Visit (INDEPENDENT_AMBULATORY_CARE_PROVIDER_SITE_OTHER): Payer: Medicare Other | Admitting: Otolaryngology

## 2020-11-13 ENCOUNTER — Encounter: Payer: Self-pay | Admitting: Physical Therapy

## 2020-11-13 ENCOUNTER — Ambulatory Visit (INDEPENDENT_AMBULATORY_CARE_PROVIDER_SITE_OTHER): Payer: Medicare Other | Admitting: Physical Therapy

## 2020-11-13 DIAGNOSIS — R293 Abnormal posture: Secondary | ICD-10-CM | POA: Diagnosis not present

## 2020-11-13 DIAGNOSIS — M6281 Muscle weakness (generalized): Secondary | ICD-10-CM | POA: Diagnosis not present

## 2020-11-13 DIAGNOSIS — R2689 Other abnormalities of gait and mobility: Secondary | ICD-10-CM | POA: Diagnosis not present

## 2020-11-13 DIAGNOSIS — M25661 Stiffness of right knee, not elsewhere classified: Secondary | ICD-10-CM

## 2020-11-13 NOTE — Therapy (Signed)
[facility] Physical Therapy [address] [place], [place], [address] Phone: [phone]   Fax:  [phone]  Physical Therapy Treatment  Patient Details  Name: Timothy Faulkner MRN: [id number] Date of Birth: 1946/06/27 Referring Provider (PT): Monica Martinez, MD   Encounter Date: 11/13/2020   PT End of Session - 11/13/20 1430    Visit Number 11    Number of Visits 25    Date for PT Re-Evaluation 12/24/20    Authorization Type UHC Medicare    Progress Note Due on Visit 20    PT Start Time 1430    PT Stop Time 1515    PT Time Calculation (min) 45 min    Equipment Utilized During Treatment Gait belt    Activity Tolerance Patient tolerated treatment well;Patient limited by fatigue    Behavior During Therapy [facility] for tasks assessed/performed           Past Medical History:  Diagnosis Date  . Anemia   . Arthritis    Gout  . Automatic implantable cardioverter-defibrillator in situ    [employer]  . CHF   . ESRD on dialysis [facility])    [name] 03/11/2013 (03/11/2013) dialysis M/W/F  . Gangrene ([place])    left foot  . GERD (gastroesophageal reflux disease)   . Gout    "once a year"  . Heart murmur   . HYPERCHOLESTEROLEMIA, MIXED   . HYPERTENSION   . Macular degeneration   . Osteomyelitis ([place])    right foot  . 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 Pacemaker   . Peripheral arterial disease (HCC)    left fifth toe ulcer, healing  . Pneumonia   . Shortness of breath   . 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 Wears dentures   . Wears glasses     Past Surgical History:  Procedure Laterality Date  . A/V FISTULAGRAM Left 07/20/2018   Procedure: A/V FISTULAGRAM;  Surgeon: [name], MD;  Location: [place] CV LAB;  Service: Cardiovascular;  Laterality: Left;  . A/V FISTULAGRAM N/A 07/15/2019   Procedure: A/V FISTULAGRAM - Left Arm;  Surgeon: Elam Dutch, MD;  Location: [place] CV  LAB;  Service: Cardiovascular;  Laterality: N/A;  . ABDOMINAL AORTOGRAM N/A 02/25/2019   Procedure: ABDOMINAL AORTOGRAM;  Surgeon: Elam Dutch, MD;  Location: [place] CV LAB;  Service: Cardiovascular;  Laterality: N/A;  . ABDOMINAL AORTOGRAM W/LOWER EXTREMITY Bilateral 07/20/2018   Procedure: ABDOMINAL AORTOGRAM W/LOWER EXTREMITY;  Surgeon: [name], MD;  Location: [place] CV LAB;  Service: Cardiovascular;  Laterality: Bilateral;  . AMPUTATION Right 09/07/2018   Procedure: Right fifth metatarsectomy;  Surgeon: [name], DPM;  Location: [place];  Service: Podiatry;  Laterality: Right;  . AMPUTATION Left 03/08/2019   Procedure: AMPUTATION  3RD AND 4TH TOES LEFT FOOT;  Surgeon: [name], DPM;  Location: [place];  Service: Podiatry;  Laterality: Left;  . AMPUTATION Left 03/15/2019   Procedure: AMPUTATION BELOW KNEE;  Surgeon: Elam Dutch, MD;  Location: [facility];  Service: Vascular;  Laterality: Left;  . AV FISTULA PLACEMENT Right 12/13/2012   Procedure: ARTERIOVENOUS (AV) FISTULA CREATION;  Surgeon: [name], MD;  Location: [place];  Service: Vascular;  Laterality: Right;  Ultrasound guided  . AV FISTULA PLACEMENT Left 05/07/2016   Procedure: LEFT RADIOCEPHALIC ARTERIOVENOUS (AV) FISTULA CREATION;  Surgeon: [name], MD;  Location: [place];  Service:  Vascular;  Laterality: Left;  . AV FISTULA PLACEMENT Right 08/09/2019   Procedure: INSERTION OF RIGHT ARTERIOVENOUS (AV) 4-70m GORE-TEX STRETCH GRAFT RIGHT  ARM;  Surgeon: FElam Dutch MD;  [place]  Service: Vascular;  Laterality: Right;  . BASCILIC VEIN TRANSPOSITION Right 03/26/2016   Procedure: RIGHT BASILIC VEIN TRANSPOSITION;  Surgeon: TRosetta Posner MD;  [place]  Service: Vascular;  Laterality: Right;  . CARDIAC CATHETERIZATION    . CARDIAC DEFIBRILLATOR PLACEMENT     BChemical engineer . CATARACT EXTRACTION W/PHACO Bilateral   . EYE SURGERY Bilateral    Cataract  . FISTULOGRAM Left  04/22/2018   Procedure: FISTULOGRAM UPPER EXTREMITY;  Surgeon: CMarty Heck MD;  [place]  Service: Vascular;  Laterality: Left;  . I & D EXTREMITY Right 07/15/2018   Procedure: IRRIGATION AND DEBRIDEMENT RIGHT FOOT;  Surgeon: PEvelina Bucy DPM;  [place]  Service: Podiatry;  Laterality: Right;  . INCISION AND DRAINAGE ABSCESS / HEMATOMA OF BURSA / KNEE / THIGH Left 2012   "knee" (03/11/2013)  . INSERT / REPLACE / REMOVE PACEMAKER    . INSERTION OF DIALYSIS CATHETER Left 04/22/2018   Procedure: INSERTION OF DIALYSIS CATHETER;  Surgeon: CMarty Heck MD;  [place]  Service: Vascular;  Laterality: Left;  . IR FLUORO GUIDE CV LINE LEFT  07/15/2018  . IR PTA VENOUS EXCEPT DIALYSIS CIRCUIT  07/15/2018  . LOWER EXTREMITY ANGIOGRAPHY Right 07/21/2018   Procedure: LOWER EXTREMITY ANGIOGRAPHY;  Surgeon: CMarty Heck MD;  [place];  Service: Cardiovascular;  Laterality: Right;  . LOWER EXTREMITY ANGIOGRAPHY Bilateral 02/25/2019   Procedure: Lower Extremity Angiography;  Surgeon: FElam Dutch MD;  [place];  Service: Cardiovascular;  Laterality: Bilateral;  . METATARSAL HEAD EXCISION Right 07/15/2018   Procedure: METATARSAL RESECTION;  Surgeon: PEvelina Bucy DPM;  [place]  Service: Podiatry;  Laterality: Right;  . MULTIPLE TOOTH EXTRACTIONS    . PERIPHERAL VASCULAR ATHERECTOMY Right 02/28/2019   Procedure: PERIPHERAL VASCULAR ATHERECTOMY;  Surgeon: CWaynetta Sandy MD;  [place];  Service: Cardiovascular;  Laterality: Right;  right tp trunk   . PERIPHERAL VASCULAR BALLOON ANGIOPLASTY Left 02/25/2019   Procedure: PERIPHERAL VASCULAR BALLOON ANGIOPLASTY;  Surgeon: FElam Dutch MD;  [place];  Service: Cardiovascular;  Laterality: Left;  tibial peroneal trunk and PT  . PERIPHERAL VASCULAR INTERVENTION Right 07/21/2018   Procedure: PERIPHERAL VASCULAR  INTERVENTION;  Surgeon: CMarty Heck MD;  [place];  Service: Cardiovascular;  Laterality: Right;  peroneal stents  . REVISON OF ARTERIOVENOUS FISTULA Right 35/18/8416  Procedure: Plication OF Right Arm ARTERIOVENOUS FISTULA;  Surgeon: CElam Dutch MD;  [facility]  Service: Vascular;  Laterality: Right;  . REVISON OF ARTERIOVENOUS FISTULA Left 04/22/2018   Procedure: REVISION OF RADIOCEPHALIC ARTERIOVENOUS FISTULA;  Surgeon: CMarty Heck MD;  [place]  Service: Vascular;  Laterality: Left;  . SHUNTOGRAM N/A 05/31/2013   Procedure: SEarney Mallet  Surgeon: VSerafina Mitchell MD;  [facility];  Service: Cardiovascular;  Laterality: N/A;  . UPPER EXTREMITY VENOGRAPHY Right 07/23/2018   Procedure: UPPER EXTREMITY VENOGRAPHY;  Surgeon: DAngelia Mould MD;  [place];  Service: Cardiovascular;  Laterality: Right;  . UPPER EXTREMITY VENOGRAPHY  07/15/2019   Procedure: UPPER EXTREMITY VENOGRAPHY;  Surgeon: FElam Dutch MD;  [place];  Service: Cardiovascular;;  rt arm   . WOUND DEBRIDEMENT Right 07/17/2018   Procedure: Wound Debridement; Closure Filleted toe flap Right Foot;  Surgeon: [name], DPM;  Location: [place];  Service: Podiatry;  Laterality: Right;  . WOUND DEBRIDEMENT Right 09/07/2018   Procedure: Debridement Right Foot Wound, application of wound vac;  Surgeon: [name], DPM;  Location: [place];  Service: Podiatry;  Laterality: Right;  . WOUND DEBRIDEMENT Right 03/08/2019   Procedure: DEBRIDEMENT WOUND RIGHT FOOT;  Surgeon: [name], DPM;  Location: [place];  Service: Podiatry;  Laterality: Right;    There were no vitals filed for this visit.   Subjective Assessment - 11/13/20 1431    Subjective He has had some issues with dialysis a couple of times.  He has been wearing prosthesis daily most of awake hours including to dialysis.    Pertinent History PAD, DM2,  arthritis, gout, CHF, implanted cardioverter-defib, HTN, CKD w/ ESRD    Patient Stated Goals to use prosthesis to walk in community,    Currently in Pain? No/denies                             [facility] Adult PT Treatment/Exercise - 11/13/20 1430      Transfers   Transfers Sit to Stand;Stand to Sit;Squat Pivot Transfers    Sit to Stand 5: Supervision;With upper extremity assist;With armrests;From chair/3-in-1;Other (comment)   requires RW to stabilize   Stand to Sit 5: Supervision;With upper extremity assist;With armrests;To chair/3-in-1;Other (comment)   requires RW for stability     Ambulation/Gait   Ambulation/Gait Yes    Ambulation/Gait Assistance 5: Supervision    Ambulation/Gait Assistance Details verbal cues on upright posture    Ambulation Distance (Feet) 100 Feet   50' & 100'   Assistive device Rolling walker;Prosthesis    Ambulation Surface Level;Indoor    Ramp --    Curb --      Neuro Re-ed    Neuro Re-ed Details  standing activities to facilitate upright posture - standing with back to door frame with RW in front reaching single UE & BUEs overhead with hold for 2 deep breathes  and standing with posterior pelvis to counter with feet under pelvis and hands beside hips.      Knee/Hip Exercises: Stretches   Active Hamstring Stretch Both;2 reps;20 seconds    Active Hamstring Stretch Limitations supine leg raise w/ knee ext & strap DF      Knee/Hip Exercises: Aerobic   Nustep seat 11 Level 5 for 8 min      Knee/Hip Exercises: Machines for Strengthening   Cybex Leg Press BLEs 100# 15 reps 2 sets 1st set back 45* & 2nd set back flat      Prosthetics   Prosthetic Care Comments  PT instructed in using antiperspirant on limb after showering.  Walking into bathroom with RW to sit on tub bench, remove prosthesis, wash liner, after shower apply Secret antiperspirant and donne prosthesis with dry liner.  Switch liners daily with bathing.    Current prosthetic wear  tolerance (days/week)  daily including dialysis    Current prosthetic wear tolerance (#hours/day)  most of awake hours    Edema pitting edema    Residual limb condition  No open areas, dry skin, normal temperature, normal color, cylinerical shape    Education Provided Residual limb care;Proper wear schedule/adjustment;Proper weight-bearing schedule/adjustment;Other (comment);Prosthetic cleaning   see prosthetic care comments  Person(s) Educated Patient;Child(ren)    Education Method Explanation;Verbal cues    Education Method Verbalized understanding;Verbal cues required;Needs further instruction    Donning Prosthesis Supervision                    PT Short Term Goals - 11/13/20 1640      PT SHORT TERM GOAL #1   Title Patient verbalizes proper adjustment of ply socks.    Time 4    Period Weeks    Status On-going    Target Date 11/29/20      PT SHORT TERM GOAL #2   Title Patient tolerates prosthesis wear >12hrs total on nondialysis days & >3hrs after dialysis if out of bed.    Time 4    Period Weeks    Status On-going    Target Date 11/29/20      PT SHORT TERM GOAL #3   Title Patient standing balance with RW & prosthesis reaching 7" anteriorly & within 10" of floor with supervision.    Time 4    Period Weeks    Status On-going    Target Date 11/29/20      PT SHORT TERM GOAL #4   Title Patient ambulates 125' with RW & prosthesis with supervision.    Time 4    Period Weeks    Status On-going    Target Date 11/29/20      PT SHORT TERM GOAL #5   Title Patient negotiates ramps & curbs with RW & prostheis with modA    Time 4    Period Weeks    Status On-going    Target Date 11/29/20             PT Long Term Goals - 10/30/20 1541      PT LONG TERM GOAL #1   Title Patient & family verbalize & demonstrate understanding of proper prosthetic care to enable utilization of prosthesis.    Time 12    Period Weeks    Status On-going    Target Date 12/20/20       PT LONG TERM GOAL #2   Title Patient tolerates wear of prosthesis >90% of awake hours without skin or limb discomfort issues.    Time 12    Period Weeks    Status On-going    Target Date 12/20/20      PT LONG TERM GOAL #3   Title Berg Balance >/= 36/56    Time 12    Period Weeks    Status On-going    Target Date 12/20/20      PT LONG TERM GOAL #4   Title Patient ambulates 200' with RW & prosthesis modified independent.    Time 12    Period Weeks    Status On-going    Target Date 12/20/20      PT LONG TERM GOAL #5   Title Patient negotiates ramps & curbs with RW & prosthesis with minA.    Time 12    Period Weeks    Status On-going    Target Date 12/20/20                 Plan - 11/13/20 1436    Clinical Impression Statement Patient has improved his activity tolerance. He reports wearing prosthesis to dialysis with properly subtracting weight from weigh-in & weigh-out.  PT continues to work on functional strength & flexibility including posture.    Personal Factors and Comorbidities Comorbidity 3+;Fitness;Time since onset of injury/illness/exacerbation  Comorbidities PAD, DM2, arthritis, gout, CHF, implanted cardioverter-defib, HTN, CKD w/ ESRD, blind left knee    Examination-Activity Limitations Locomotion Level;Stairs;Stand;Transfers    Examination-Participation Restrictions Community Activity    Stability/Clinical Decision Making Evolving/Moderate complexity    Rehab Potential Good    PT Frequency 2x / week    PT Duration 12 weeks    PT Treatment/Interventions ADLs/Self Care Home Management;DME Instruction;Gait training;Stair training;Functional mobility training;Therapeutic activities;Therapeutic exercise;Balance training;Neuromuscular re-education;Patient/family education;Prosthetic Training;Manual techniques;Vestibular    PT Next Visit Plan review prosthetic care,  prosthetic gait with RW including ramps & curbs,  exercise for strength, balance, flexibility &  endurance.    Consulted and Agree with Plan of Care Patient;Family member/caregiver    Family Member Consulted daughter,Sherry Cira Servant           Patient will benefit from skilled therapeutic intervention in order to improve the following deficits and impairments:  Abnormal gait,Cardiopulmonary status limiting activity,Decreased activity tolerance,Decreased balance,Decreased endurance,Decreased knowledge of use of DME,Decreased mobility,Decreased range of motion,Decreased scar mobility,Decreased strength,Increased edema,Postural dysfunction,Prosthetic Dependency  Visit Diagnosis: Other abnormalities of gait and mobility  Muscle weakness (generalized)  Stiffness of right knee, not elsewhere classified  Abnormal posture     Problem List Patient Active Problem List   Diagnosis Date Noted  . Polycythemia, secondary 02/21/2020  . Stable treated proliferative diabetic retinopathy of right eye determined by examination associated with type 2 diabetes mellitus ([place]) 11/29/2019  . Stable treated proliferative diabetic retinopathy of left eye without macular edema determined by examination associated with type 2 diabetes mellitus ([place]) 11/29/2019  . Advanced nonexudative age-related macular degeneration of both eyes with subfoveal involvement 11/29/2019  . 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  . Sepsis ([place]) 04/14/2019  . Hypocalcemia 04/08/2019  . Drug induced constipation   . Anemia of chronic disease   . ESRD on dialysis ([place])   . Postoperative pain   . Phantom limb pain ([place])   . S/P BKA (below knee amputation), left ([place]) 03/18/2019  . Unilateral complete BKA, left, initial encounter ([place]) 03/17/2019  . Critical lower limb ischemia ([place]) 03/12/2019  . Infection of amputation stump, right lower extremity ([place]) 03/10/2019  . Hypotension 02/23/2019  . Cellulitis of left foot 02/23/2019  . Gangrene of toe of left foot ([place]) 02/22/2019  . Complication of vascular  dialysis catheter 11/24/2018  . Chronic osteomyelitis involving right ankle and foot ([place])   . Ulcer of right foot with fat layer exposed ([place]) 09/02/2018  . Encounter for planned post-operative wound closure   . Acute osteomyelitis of metatarsal bone of right foot ([place])   . Diabetic ulcer of midfoot associated with diabetes mellitus due to underlying condition, with necrosis of bone ([place])   . Osteomyelitis of right foot ([place])   . Vascular calcification   . Cellulitis and abscess of foot, except toes   . Anemia associated with chronic renal failure 07/13/2018  . Cardiomyopathy, dilated, nonischemic ([place]) 07/13/2018  . Diabetic foot infection ([place]) 07/13/2018  . PAD (peripheral artery disease) ([place])   . Macular degeneration   . Ventricular tachycardia ([place]) 06/24/2017  . Embolism due to vascular prosthetic devices, implants and grafts, initial encounter ([place]) 02/04/2016  . Dependence on renal dialysis ([place]) 05/30/2015  . Shortness of breath 02/05/2015  . Chronic systolic heart failure ([place]) 08/22/2014  . Diabetic infection of right foot ([place]) 07/13/2014  . Type 2 diabetes mellitus with diabetic peripheral angiopathy without gangrene ([place]) 03/18/2014  . Anxiety disorder, unspecified 02/23/2014  . Fever, unspecified 02/23/2014  .  Headache 02/23/2014  . Pain, unspecified 02/23/2014  . Pruritus, unspecified 02/23/2014  . Mild protein-calorie malnutrition ([place]) 12/26/2013  . ESRD (end stage renal disease) on dialysis ([place]) 11/11/2013  . Snoring 11/11/2013  . Unspecified sleep apnea 11/11/2013  . Other pancytopenia ([place]) 09/22/2013  . Hemoptysis 09/20/2013  . Personal history of nicotine dependence 07/12/2013  . Coagulation defect, unspecified ([place]) 05/16/2013  . Iron deficiency anemia, unspecified 05/06/2013  . Other disorders of plasma-protein metabolism, not elsewhere classified 04/15/2013  . 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 Other cardiomyopathies ([place]) 03/18/2013  . Secondary hyperparathyroidism of renal origin ([place]) 03/18/2013  . Type 2 diabetes mellitus without complications ([place]) 29/24/4628  . Pre-transplant evaluation for kidney transplant 01/11/2013  . Automatic implantable cardioverter-defibrillator in situ 10/01/2010  . HYPERCHOLESTEROLEMIA, MIXED 05/03/2010  . Essential hypertension 05/03/2010  . CHF 05/03/2010    Jamey Reas, PT, DPT 11/13/2020, 4:42 PM  [facility] Physical Therapy [address] [place], [place], [address] Phone: [phone]   Fax:  [phone]  Name: MYSHAWN CHIRIBOGA MRN: [id number] Date of Birth: Jan 08, 1946

## 2020-11-14 DIAGNOSIS — N2581 Secondary hyperparathyroidism of renal origin: Secondary | ICD-10-CM | POA: Diagnosis not present

## 2020-11-14 DIAGNOSIS — N186 End stage renal disease: Secondary | ICD-10-CM | POA: Diagnosis not present

## 2020-11-14 DIAGNOSIS — Z992 Dependence on renal dialysis: Secondary | ICD-10-CM | POA: Diagnosis not present

## 2020-11-14 DIAGNOSIS — D689 Coagulation defect, unspecified: Secondary | ICD-10-CM | POA: Diagnosis not present

## 2020-11-15 ENCOUNTER — Other Ambulatory Visit: Payer: Self-pay

## 2020-11-15 ENCOUNTER — Ambulatory Visit (INDEPENDENT_AMBULATORY_CARE_PROVIDER_SITE_OTHER): Payer: Medicare Other | Admitting: Physical Therapy

## 2020-11-15 DIAGNOSIS — M25661 Stiffness of right knee, not elsewhere classified: Secondary | ICD-10-CM

## 2020-11-15 DIAGNOSIS — R2689 Other abnormalities of gait and mobility: Secondary | ICD-10-CM | POA: Diagnosis not present

## 2020-11-15 DIAGNOSIS — M6281 Muscle weakness (generalized): Secondary | ICD-10-CM

## 2020-11-15 DIAGNOSIS — R293 Abnormal posture: Secondary | ICD-10-CM

## 2020-11-15 DIAGNOSIS — R2681 Unsteadiness on feet: Secondary | ICD-10-CM | POA: Diagnosis not present

## 2020-11-15 NOTE — Therapy (Signed)
[facility] [address] [place], [place], [address] Phone: [phone]   Fax:  [phone]  Physical Therapy Treatment  Patient Details  Name: Timothy Faulkner MRN: [id number] Date of Birth: [birth year] Referring Provider (PT): Monica Martinez, MD   Encounter Date: 11/15/2020   PT End of Session - 11/15/20 1553    Visit Number 12    Number of Visits 25    Date for PT Re-Evaluation 12/24/20    Authorization Type UHC Medicare    Progress Note Due on Visit 20    PT Start Time 1440    PT Stop Time 1518    PT Time Calculation (min) 38 min    Equipment Utilized During Treatment Gait belt    Activity Tolerance Patient tolerated treatment well;Patient limited by fatigue    Behavior During Therapy [facility] for tasks assessed/performed           Past Medical History:  Diagnosis Date  . Anemia   . Arthritis    Gout  . Automatic implantable cardioverter-defibrillator in situ    [employer]  . CHF   . ESRD on dialysis [facility])    [name] 03/11/2013 (03/11/2013) dialysis M/W/F  . Gangrene ([place])    left foot  . GERD (gastroesophageal reflux disease)   . Gout    "once a year"  . Heart murmur   . HYPERCHOLESTEROLEMIA, MIXED   . HYPERTENSION   . Macular degeneration   . Osteomyelitis ([place])    right foot  . 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 Pacemaker   . Peripheral arterial disease (HCC)    left fifth toe ulcer, healing  . Pneumonia   . Shortness of breath   . 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 Wears dentures   . Wears glasses     Past Surgical History:  Procedure Laterality Date  . A/V FISTULAGRAM Left 07/20/2018   Procedure: A/V FISTULAGRAM;  Surgeon: [name], MD;  Location: [place] CV LAB;  Service: Cardiovascular;  Laterality: Left;  . A/V FISTULAGRAM N/A 07/15/2019   Procedure: A/V FISTULAGRAM - Left Arm;  Surgeon: Elam Dutch, MD;  Location: [place] CV  LAB;  Service: Cardiovascular;  Laterality: N/A;  . ABDOMINAL AORTOGRAM N/A 02/25/2019   Procedure: ABDOMINAL AORTOGRAM;  Surgeon: Elam Dutch, MD;  Location: [place] CV LAB;  Service: Cardiovascular;  Laterality: N/A;  . ABDOMINAL AORTOGRAM W/LOWER EXTREMITY Bilateral 07/20/2018   Procedure: ABDOMINAL AORTOGRAM W/LOWER EXTREMITY;  Surgeon: [name], MD;  Location: [place] CV LAB;  Service: Cardiovascular;  Laterality: Bilateral;  . AMPUTATION Right 09/07/2018   Procedure: Right fifth metatarsectomy;  Surgeon: [name], DPM;  Location: [place];  Service: Podiatry;  Laterality: Right;  . AMPUTATION Left 03/08/2019   Procedure: AMPUTATION  3RD AND 4TH TOES LEFT FOOT;  Surgeon: [name], DPM;  Location: [place];  Service: Podiatry;  Laterality: Left;  . AMPUTATION Left 03/15/2019   Procedure: AMPUTATION BELOW KNEE;  Surgeon: Elam Dutch, MD;  Location: [facility];  Service: Vascular;  Laterality: Left;  . AV FISTULA PLACEMENT Right 12/13/2012   Procedure: ARTERIOVENOUS (AV) FISTULA CREATION;  Surgeon: [name], MD;  Location: [place];  Service: Vascular;  Laterality: Right;  Ultrasound guided  . AV FISTULA PLACEMENT Left 05/07/2016   Procedure: LEFT RADIOCEPHALIC ARTERIOVENOUS (AV) FISTULA CREATION;  Surgeon: [name], MD;  Location: [place];  Service:  Vascular;  Laterality: Left;  . AV FISTULA PLACEMENT Right 08/09/2019   Procedure: INSERTION OF RIGHT ARTERIOVENOUS (AV) 4-70m GORE-TEX STRETCH GRAFT RIGHT  ARM;  Surgeon: FElam Dutch MD;  [place]  Service: Vascular;  Laterality: Right;  . BASCILIC VEIN TRANSPOSITION Right 03/26/2016   Procedure: RIGHT BASILIC VEIN TRANSPOSITION;  Surgeon: TRosetta Posner MD;  [place]  Service: Vascular;  Laterality: Right;  . CARDIAC CATHETERIZATION    . CARDIAC DEFIBRILLATOR PLACEMENT     BChemical engineer . CATARACT EXTRACTION W/PHACO Bilateral   . EYE SURGERY Bilateral    Cataract  . FISTULOGRAM Left  04/22/2018   Procedure: FISTULOGRAM UPPER EXTREMITY;  Surgeon: CMarty Heck MD;  [place]  Service: Vascular;  Laterality: Left;  . I & D EXTREMITY Right 07/15/2018   Procedure: IRRIGATION AND DEBRIDEMENT RIGHT FOOT;  Surgeon: PEvelina Bucy DPM;  [place]  Service: Podiatry;  Laterality: Right;  . INCISION AND DRAINAGE ABSCESS / HEMATOMA OF BURSA / KNEE / THIGH Left 2012   "knee" (03/11/2013)  . INSERT / REPLACE / REMOVE PACEMAKER    . INSERTION OF DIALYSIS CATHETER Left 04/22/2018   Procedure: INSERTION OF DIALYSIS CATHETER;  Surgeon: CMarty Heck MD;  [place]  Service: Vascular;  Laterality: Left;  . IR FLUORO GUIDE CV LINE LEFT  07/15/2018  . IR PTA VENOUS EXCEPT DIALYSIS CIRCUIT  07/15/2018  . LOWER EXTREMITY ANGIOGRAPHY Right 07/21/2018   Procedure: LOWER EXTREMITY ANGIOGRAPHY;  Surgeon: CMarty Heck MD;  [place];  Service: Cardiovascular;  Laterality: Right;  . LOWER EXTREMITY ANGIOGRAPHY Bilateral 02/25/2019   Procedure: Lower Extremity Angiography;  Surgeon: FElam Dutch MD;  [place];  Service: Cardiovascular;  Laterality: Bilateral;  . METATARSAL HEAD EXCISION Right 07/15/2018   Procedure: METATARSAL RESECTION;  Surgeon: PEvelina Bucy DPM;  [place]  Service: Podiatry;  Laterality: Right;  . MULTIPLE TOOTH EXTRACTIONS    . PERIPHERAL VASCULAR ATHERECTOMY Right 02/28/2019   Procedure: PERIPHERAL VASCULAR ATHERECTOMY;  Surgeon: CWaynetta Sandy MD;  [place];  Service: Cardiovascular;  Laterality: Right;  right tp trunk   . PERIPHERAL VASCULAR BALLOON ANGIOPLASTY Left 02/25/2019   Procedure: PERIPHERAL VASCULAR BALLOON ANGIOPLASTY;  Surgeon: FElam Dutch MD;  [place];  Service: Cardiovascular;  Laterality: Left;  tibial peroneal trunk and PT  . PERIPHERAL VASCULAR INTERVENTION Right 07/21/2018   Procedure: PERIPHERAL VASCULAR  INTERVENTION;  Surgeon: CMarty Heck MD;  [place];  Service: Cardiovascular;  Laterality: Right;  peroneal stents  . REVISON OF ARTERIOVENOUS FISTULA Right 35/18/8416  Procedure: Plication OF Right Arm ARTERIOVENOUS FISTULA;  Surgeon: CElam Dutch MD;  [facility]  Service: Vascular;  Laterality: Right;  . REVISON OF ARTERIOVENOUS FISTULA Left 04/22/2018   Procedure: REVISION OF RADIOCEPHALIC ARTERIOVENOUS FISTULA;  Surgeon: CMarty Heck MD;  [place]  Service: Vascular;  Laterality: Left;  . SHUNTOGRAM N/A 05/31/2013   Procedure: SEarney Mallet  Surgeon: VSerafina Mitchell MD;  [facility];  Service: Cardiovascular;  Laterality: N/A;  . UPPER EXTREMITY VENOGRAPHY Right 07/23/2018   Procedure: UPPER EXTREMITY VENOGRAPHY;  Surgeon: DAngelia Mould MD;  [place];  Service: Cardiovascular;  Laterality: Right;  . UPPER EXTREMITY VENOGRAPHY  07/15/2019   Procedure: UPPER EXTREMITY VENOGRAPHY;  Surgeon: FElam Dutch MD;  [place];  Service: Cardiovascular;;  rt arm   . WOUND DEBRIDEMENT Right 07/17/2018   Procedure: Wound Debridement; Closure Filleted toe flap Right Foot;  Surgeon: [name], DPM;  Location: [place];  Service: Podiatry;  Laterality: Right;  . WOUND DEBRIDEMENT Right 09/07/2018   Procedure: Debridement Right Foot Wound, application of wound vac;  Surgeon: [name], DPM;  Location: [place];  Service: Podiatry;  Laterality: Right;  . WOUND DEBRIDEMENT Right 03/08/2019   Procedure: DEBRIDEMENT WOUND RIGHT FOOT;  Surgeon: [name], DPM;  Location: [place];  Service: Podiatry;  Laterality: Right;    There were no vitals filed for this visit.   Subjective Assessment - 11/15/20 1543    Subjective He arrives with some pain at distal residual limb "at the bone, I wonder if they pulled off too much fluid at dialysis"    Pertinent History PAD, DM2, arthritis, gout,  CHF, implanted cardioverter-defib, HTN, CKD w/ ESRD    Patient Stated Goals to use prosthesis to walk in community,            [facility] Adult PT Treatment/Exercise - 11/15/20 0001      Transfers   Transfers Sit to Stand;Stand to Sit;Squat Pivot Transfers    Sit to Stand 5: Supervision;With upper extremity assist;With armrests;From chair/3-in-1;Other (comment)    Stand to Sit 5: Supervision;With upper extremity assist;With armrests;To chair/3-in-1;Other (comment)      Ambulation/Gait   Ambulation/Gait Yes    Ambulation/Gait Assistance 5: Supervision    Ambulation Distance (Feet) 100 Feet   50, 25, 25   Assistive device Rolling walker;Prosthesis    Ambulation Surface Level;Indoor      Neuro Re-ed    Neuro Re-ed Details  balance at sink feet normal BOS with head turns X10, head nods x10, and then arms crossed over chest and trunk rotations X10      Knee/Hip Exercises: Stretches   Active Hamstring Stretch Left;3 reps;30 seconds    Active Hamstring Stretch Limitations at leg press      Knee/Hip Exercises: Aerobic   Nustep seat 11 Level 5 for 10 min      Knee/Hip Exercises: Machines for Strengthening   Cybex Leg Press BLEs 100# 15 reps 2 sets 1st set back 45* & 2nd set back flat                    PT Short Term Goals - 11/13/20 1640      PT SHORT TERM GOAL #1   Title Patient verbalizes proper adjustment of ply socks.    Time 4    Period Weeks    Status On-going    Target Date 11/29/20      PT SHORT TERM GOAL #2   Title Patient tolerates prosthesis wear >12hrs total on nondialysis days & >3hrs after dialysis if out of bed.    Time 4    Period Weeks    Status On-going    Target Date 11/29/20      PT SHORT TERM GOAL #3   Title Patient standing balance with RW & prosthesis reaching 7" anteriorly & within 10" of floor with supervision.    Time 4    Period Weeks    Status On-going    Target Date 11/29/20      PT SHORT TERM GOAL #4   Title Patient ambulates 125'  with RW & prosthesis with supervision.    Time 4    Period Weeks    Status On-going  Target Date 11/29/20      PT SHORT TERM GOAL #5   Title Patient negotiates ramps & curbs with RW & prostheis with modA    Time 4    Period Weeks    Status On-going    Target Date 11/29/20             PT Long Term Goals - 10/30/20 1541      PT LONG TERM GOAL #1   Title Patient & family verbalize & demonstrate understanding of proper prosthetic care to enable utilization of prosthesis.    Time 12    Period Weeks    Status On-going    Target Date 12/20/20      PT LONG TERM GOAL #2   Title Patient tolerates wear of prosthesis >90% of awake hours without skin or limb discomfort issues.    Time 12    Period Weeks    Status On-going    Target Date 12/20/20      PT LONG TERM GOAL #3   Title [place] Balance >/= 36/56    Time 12    Period Weeks    Status On-going    Target Date 12/20/20      PT LONG TERM GOAL #4   Title Patient ambulates 200' with RW & prosthesis modified independent.    Time 12    Period Weeks    Status On-going    Target Date 12/20/20      PT LONG TERM GOAL #5   Title Patient negotiates ramps & curbs with RW & prosthesis with minA.    Time 12    Period Weeks    Status On-going    Target Date 12/20/20                 Plan - 11/15/20 1555    Clinical Impression Statement He had difficulty donning prosthetic upon arrival so this was removed and redonned by PT and it was able to go on appropriately with about 4 clicks in sitting. He then was able to perform session with less overall distal limb pain which he may have been getting due to improper fit/don of prosthesis. We then worked on his funcitonal mobility, leg strength, balance and overall endurance with good tolerance. Continue POC    Personal Factors and Comorbidities Comorbidity 3+;Fitness;Time since onset of injury/illness/exacerbation    Comorbidities PAD, DM2, arthritis, gout, CHF, implanted  cardioverter-defib, HTN, CKD w/ ESRD, blind left knee    Examination-Activity Limitations Locomotion Level;Stairs;Stand;Transfers    Examination-Participation Restrictions Community Activity    Stability/Clinical Decision Making Evolving/Moderate complexity    Rehab Potential Good    PT Frequency 2x / week    PT Duration 12 weeks    PT Treatment/Interventions ADLs/Self Care Home Management;DME Instruction;Gait training;Stair training;Functional mobility training;Therapeutic activities;Therapeutic exercise;Balance training;Neuromuscular re-education;Patient/family education;Prosthetic Training;Manual techniques;Vestibular    PT Next Visit Plan review prosthetic care,  prosthetic gait with RW including ramps & curbs,  exercise for strength, balance, flexibility & endurance.    Consulted and Agree with Plan of Care Patient;Family member/caregiver    Family Member Consulted daughter,Sherry Cira Servant           Patient will benefit from skilled therapeutic intervention in order to improve the following deficits and impairments:  Abnormal gait,Cardiopulmonary status limiting activity,Decreased activity tolerance,Decreased balance,Decreased endurance,Decreased knowledge of use of DME,Decreased mobility,Decreased range of motion,Decreased scar mobility,Decreased strength,Increased edema,Postural dysfunction,Prosthetic Dependency  Visit Diagnosis: Other abnormalities of gait and mobility  Muscle weakness (generalized)  Stiffness of  right knee, not elsewhere classified  Abnormal posture  Unsteadiness on feet     Problem List Patient Active Problem List   Diagnosis Date Noted  . Polycythemia, secondary 02/21/2020  . Stable treated proliferative diabetic retinopathy of right eye determined by examination associated with type 2 diabetes mellitus ([place]) 11/29/2019  . Stable treated proliferative diabetic retinopathy of left eye without macular edema determined by examination associated with type 2  diabetes mellitus ([place]) 11/29/2019  . Advanced nonexudative age-related macular degeneration of both eyes with subfoveal involvement 11/29/2019  . 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  . Sepsis ([place]) 04/14/2019  . Hypocalcemia 04/08/2019  . Drug induced constipation   . Anemia of chronic disease   . ESRD on dialysis ([place])   . Postoperative pain   . Phantom limb pain ([place])   . S/P BKA (below knee amputation), left ([place]) 03/18/2019  . Unilateral complete BKA, left, initial encounter ([place]) 03/17/2019  . Critical lower limb ischemia ([place]) 03/12/2019  . Infection of amputation stump, right lower extremity ([place]) 03/10/2019  . Hypotension 02/23/2019  . Cellulitis of left foot 02/23/2019  . Gangrene of toe of left foot ([place]) 02/22/2019  . Complication of vascular dialysis catheter 11/24/2018  . Chronic osteomyelitis involving right ankle and foot ([place])   . Ulcer of right foot with fat layer exposed ([place]) 09/02/2018  . Encounter for planned post-operative wound closure   . Acute osteomyelitis of metatarsal bone of right foot ([place])   . Diabetic ulcer of midfoot associated with diabetes mellitus due to underlying condition, with necrosis of bone ([place])   . Osteomyelitis of right foot ([place])   . Vascular calcification   . Cellulitis and abscess of foot, except toes   . Anemia associated with chronic renal failure 07/13/2018  . Cardiomyopathy, dilated, nonischemic ([place]) 07/13/2018  . Diabetic foot infection ([place]) 07/13/2018  . PAD (peripheral artery disease) ([place])   . Macular degeneration   . Ventricular tachycardia ([place]) 06/24/2017  . Embolism due to vascular prosthetic devices, implants and grafts, initial encounter ([place]) 02/04/2016  . Dependence on renal dialysis ([place]) 05/30/2015  . Shortness of breath 02/05/2015  . Chronic systolic heart failure ([place]) 08/22/2014  . Diabetic infection of right foot ([place]) 07/13/2014  . Type 2 diabetes mellitus with diabetic peripheral  angiopathy without gangrene ([place]) 03/18/2014  . Anxiety disorder, unspecified 02/23/2014  . Fever, unspecified 02/23/2014  . Headache 02/23/2014  . Pain, unspecified 02/23/2014  . Pruritus, unspecified 02/23/2014  . Mild protein-calorie malnutrition ([place]) 12/26/2013  . ESRD (end stage renal disease) on dialysis ([place]) 11/11/2013  . Snoring 11/11/2013  . Unspecified sleep apnea 11/11/2013  . Other pancytopenia ([place]) 09/22/2013  . Hemoptysis 09/20/2013  . Personal history of nicotine dependence 07/12/2013  . Coagulation defect, unspecified ([place]) 05/16/2013  . Iron deficiency anemia, unspecified 05/06/2013  . Other disorders of plasma-protein metabolism, not elsewhere classified 04/15/2013  . 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 Other cardiomyopathies ([place]) 03/18/2013  . Secondary hyperparathyroidism of renal origin ([place]) 03/18/2013  . Type 2 diabetes mellitus without complications ([place]) 57/32/2025  . Pre-transplant evaluation for kidney transplant 01/11/2013  . Automatic implantable cardioverter-defibrillator in situ 10/01/2010  . HYPERCHOLESTEROLEMIA, MIXED 05/03/2010  . Essential hypertension 05/03/2010  . CHF 05/03/2010    Silvestre Mesi 11/15/2020, 3:59 PM  [facility] Physical Therapy [address] [place], [place], [address] Phone: [phone]   Fax:  [phone]  Name: Deshawn  HURBERT DURAN MRN: [id number] Date of Birth: 12/05/1945

## 2020-11-16 DIAGNOSIS — Z992 Dependence on renal dialysis: Secondary | ICD-10-CM | POA: Diagnosis not present

## 2020-11-16 DIAGNOSIS — N2581 Secondary hyperparathyroidism of renal origin: Secondary | ICD-10-CM | POA: Diagnosis not present

## 2020-11-16 DIAGNOSIS — N186 End stage renal disease: Secondary | ICD-10-CM | POA: Diagnosis not present

## 2020-11-16 DIAGNOSIS — D689 Coagulation defect, unspecified: Secondary | ICD-10-CM | POA: Diagnosis not present

## 2020-11-19 DIAGNOSIS — N2581 Secondary hyperparathyroidism of renal origin: Secondary | ICD-10-CM | POA: Diagnosis not present

## 2020-11-19 DIAGNOSIS — Z992 Dependence on renal dialysis: Secondary | ICD-10-CM | POA: Diagnosis not present

## 2020-11-19 DIAGNOSIS — D689 Coagulation defect, unspecified: Secondary | ICD-10-CM | POA: Diagnosis not present

## 2020-11-19 DIAGNOSIS — N186 End stage renal disease: Secondary | ICD-10-CM | POA: Diagnosis not present

## 2020-11-20 ENCOUNTER — Ambulatory Visit (INDEPENDENT_AMBULATORY_CARE_PROVIDER_SITE_OTHER): Payer: Medicare Other | Admitting: Physical Therapy

## 2020-11-20 ENCOUNTER — Other Ambulatory Visit: Payer: Self-pay

## 2020-11-20 ENCOUNTER — Ambulatory Visit (INDEPENDENT_AMBULATORY_CARE_PROVIDER_SITE_OTHER): Payer: Medicare Other | Admitting: Podiatry

## 2020-11-20 DIAGNOSIS — R293 Abnormal posture: Secondary | ICD-10-CM | POA: Diagnosis not present

## 2020-11-20 DIAGNOSIS — M25661 Stiffness of right knee, not elsewhere classified: Secondary | ICD-10-CM | POA: Diagnosis not present

## 2020-11-20 DIAGNOSIS — R2681 Unsteadiness on feet: Secondary | ICD-10-CM | POA: Diagnosis not present

## 2020-11-20 DIAGNOSIS — L97511 Non-pressure chronic ulcer of other part of right foot limited to breakdown of skin: Secondary | ICD-10-CM | POA: Diagnosis not present

## 2020-11-20 DIAGNOSIS — Z992 Dependence on renal dialysis: Secondary | ICD-10-CM | POA: Diagnosis not present

## 2020-11-20 DIAGNOSIS — R2689 Other abnormalities of gait and mobility: Secondary | ICD-10-CM | POA: Diagnosis not present

## 2020-11-20 DIAGNOSIS — M6281 Muscle weakness (generalized): Secondary | ICD-10-CM

## 2020-11-20 DIAGNOSIS — T82858A Stenosis of vascular prosthetic devices, implants and grafts, initial encounter: Secondary | ICD-10-CM | POA: Diagnosis not present

## 2020-11-20 DIAGNOSIS — N186 End stage renal disease: Secondary | ICD-10-CM | POA: Diagnosis not present

## 2020-11-20 NOTE — Therapy (Signed)
[facility] Physical Therapy [address] [place], [place], [address] Phone: [phone]   Fax:  [phone]  Physical Therapy Treatment  Patient Details  Name: Timothy Faulkner MRN: [id number] Date of Birth: 10-24-1945 Referring Provider (PT): Monica Martinez, MD   Encounter Date: 11/20/2020   PT End of Session - 11/20/20 1639    Visit Number 13    Number of Visits 25    Date for PT Re-Evaluation 12/24/20    Authorization Type UHC Medicare    Progress Note Due on Visit 20    PT Start Time 1430    PT Stop Time 1515    PT Time Calculation (min) 45 min    Equipment Utilized During Treatment Gait belt    Activity Tolerance Patient tolerated treatment well;Patient limited by fatigue    Behavior During Therapy [facility] for tasks assessed/performed           Past Medical History:  Diagnosis Date  . Anemia   . Arthritis    Gout  . Automatic implantable cardioverter-defibrillator in situ    [employer]  . CHF   . ESRD on dialysis [facility])    [name] 03/11/2013 (03/11/2013) dialysis M/W/F  . Gangrene ([place])    left foot  . GERD (gastroesophageal reflux disease)   . Gout    "once a year"  . Heart murmur   . HYPERCHOLESTEROLEMIA, MIXED   . HYPERTENSION   . Macular degeneration   . Osteomyelitis ([place])    right foot  . 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 Pacemaker   . Peripheral arterial disease (HCC)    left fifth toe ulcer, healing  . Pneumonia   . Shortness of breath   . 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 Wears dentures   . Wears glasses     Past Surgical History:  Procedure Laterality Date  . A/V FISTULAGRAM Left 07/20/2018   Procedure: A/V FISTULAGRAM;  Surgeon: [name], MD;  Location: [place] CV LAB;  Service: Cardiovascular;  Laterality: Left;  . A/V FISTULAGRAM N/A 07/15/2019   Procedure: A/V FISTULAGRAM - Left Arm;  Surgeon: Elam Dutch, MD;  Location: [place] CV  LAB;  Service: Cardiovascular;  Laterality: N/A;  . ABDOMINAL AORTOGRAM N/A 02/25/2019   Procedure: ABDOMINAL AORTOGRAM;  Surgeon: Elam Dutch, MD;  Location: [place] CV LAB;  Service: Cardiovascular;  Laterality: N/A;  . ABDOMINAL AORTOGRAM W/LOWER EXTREMITY Bilateral 07/20/2018   Procedure: ABDOMINAL AORTOGRAM W/LOWER EXTREMITY;  Surgeon: [name], MD;  Location: [place] CV LAB;  Service: Cardiovascular;  Laterality: Bilateral;  . AMPUTATION Right 09/07/2018   Procedure: Right fifth metatarsectomy;  Surgeon: [name], DPM;  Location: [place];  Service: Podiatry;  Laterality: Right;  . AMPUTATION Left 03/08/2019   Procedure: AMPUTATION  3RD AND 4TH TOES LEFT FOOT;  Surgeon: [name], DPM;  Location: [place];  Service: Podiatry;  Laterality: Left;  . AMPUTATION Left 03/15/2019   Procedure: AMPUTATION BELOW KNEE;  Surgeon: Elam Dutch, MD;  Location: [facility];  Service: Vascular;  Laterality: Left;  . AV FISTULA PLACEMENT Right 12/13/2012   Procedure: ARTERIOVENOUS (AV) FISTULA CREATION;  Surgeon: [name], MD;  Location: [place];  Service: Vascular;  Laterality: Right;  Ultrasound guided  . AV FISTULA PLACEMENT Left 05/07/2016   Procedure: LEFT RADIOCEPHALIC ARTERIOVENOUS (AV) FISTULA CREATION;  Surgeon: [name], MD;  Location: [place];  Service:  Vascular;  Laterality: Left;  . AV FISTULA PLACEMENT Right 08/09/2019   Procedure: INSERTION OF RIGHT ARTERIOVENOUS (AV) 4-70m GORE-TEX STRETCH GRAFT RIGHT  ARM;  Surgeon: FElam Dutch MD;  [place]  Service: Vascular;  Laterality: Right;  . BASCILIC VEIN TRANSPOSITION Right 03/26/2016   Procedure: RIGHT BASILIC VEIN TRANSPOSITION;  Surgeon: TRosetta Posner MD;  [place]  Service: Vascular;  Laterality: Right;  . CARDIAC CATHETERIZATION    . CARDIAC DEFIBRILLATOR PLACEMENT     BChemical engineer . CATARACT EXTRACTION W/PHACO Bilateral   . EYE SURGERY Bilateral    Cataract  . FISTULOGRAM Left  04/22/2018   Procedure: FISTULOGRAM UPPER EXTREMITY;  Surgeon: CMarty Heck MD;  [place]  Service: Vascular;  Laterality: Left;  . I & D EXTREMITY Right 07/15/2018   Procedure: IRRIGATION AND DEBRIDEMENT RIGHT FOOT;  Surgeon: PEvelina Bucy DPM;  [place]  Service: Podiatry;  Laterality: Right;  . INCISION AND DRAINAGE ABSCESS / HEMATOMA OF BURSA / KNEE / THIGH Left 2012   "knee" (03/11/2013)  . INSERT / REPLACE / REMOVE PACEMAKER    . INSERTION OF DIALYSIS CATHETER Left 04/22/2018   Procedure: INSERTION OF DIALYSIS CATHETER;  Surgeon: CMarty Heck MD;  [place]  Service: Vascular;  Laterality: Left;  . IR FLUORO GUIDE CV LINE LEFT  07/15/2018  . IR PTA VENOUS EXCEPT DIALYSIS CIRCUIT  07/15/2018  . LOWER EXTREMITY ANGIOGRAPHY Right 07/21/2018   Procedure: LOWER EXTREMITY ANGIOGRAPHY;  Surgeon: CMarty Heck MD;  [place];  Service: Cardiovascular;  Laterality: Right;  . LOWER EXTREMITY ANGIOGRAPHY Bilateral 02/25/2019   Procedure: Lower Extremity Angiography;  Surgeon: FElam Dutch MD;  [place] [facility];  Service: Cardiovascular;  Laterality: Bilateral;  . METATARSAL HEAD EXCISION Right 07/15/2018   Procedure: METATARSAL RESECTION;  Surgeon: PEvelina Bucy DPM;  [place]  Service: Podiatry;  Laterality: Right;  . MULTIPLE TOOTH EXTRACTIONS    . PERIPHERAL VASCULAR ATHERECTOMY Right 02/28/2019   Procedure: PERIPHERAL VASCULAR ATHERECTOMY;  Surgeon: CWaynetta Sandy MD;  [place];  Service: Cardiovascular;  Laterality: Right;  right tp trunk   . PERIPHERAL VASCULAR BALLOON ANGIOPLASTY Left 02/25/2019   Procedure: PERIPHERAL VASCULAR BALLOON ANGIOPLASTY;  Surgeon: FElam Dutch MD;  [place];  Service: Cardiovascular;  Laterality: Left;  tibial peroneal trunk and PT  . PERIPHERAL VASCULAR INTERVENTION Right 07/21/2018   Procedure: PERIPHERAL VASCULAR  INTERVENTION;  Surgeon: CMarty Heck MD;  [place] [facility];  Service: Cardiovascular;  Laterality: Right;  peroneal stents  . REVISON OF ARTERIOVENOUS FISTULA Right 35/18/8416  Procedure: Plication OF Right Arm ARTERIOVENOUS FISTULA;  Surgeon: CElam Dutch MD;  [facility]  Service: Vascular;  Laterality: Right;  . REVISON OF ARTERIOVENOUS FISTULA Left 04/22/2018   Procedure: REVISION OF RADIOCEPHALIC ARTERIOVENOUS FISTULA;  Surgeon: CMarty Heck MD;  [place]  Service: Vascular;  Laterality: Left;  . SHUNTOGRAM N/A 05/31/2013   Procedure: SEarney Mallet  Surgeon: VSerafina Mitchell MD;  [facility];  Service: Cardiovascular;  Laterality: N/A;  . UPPER EXTREMITY VENOGRAPHY Right 07/23/2018   Procedure: UPPER EXTREMITY VENOGRAPHY;  Surgeon: DAngelia Mould MD;  [place] [facility];  Service: Cardiovascular;  Laterality: Right;  . UPPER EXTREMITY VENOGRAPHY  07/15/2019   Procedure: UPPER EXTREMITY VENOGRAPHY;  Surgeon: FElam Dutch MD;  [place] [facility];  Service: Cardiovascular;;  rt arm   . WOUND DEBRIDEMENT Right 07/17/2018   Procedure: Wound Debridement; Closure Filleted toe flap Right Foot;  Surgeon: [name], DPM;  Location: [place];  Service: Podiatry;  Laterality: Right;  . WOUND DEBRIDEMENT Right 09/07/2018   Procedure: Debridement Right Foot Wound, application of wound vac;  Surgeon: [name], DPM;  Location: [place];  Service: Podiatry;  Laterality: Right;  . WOUND DEBRIDEMENT Right 03/08/2019   Procedure: DEBRIDEMENT WOUND RIGHT FOOT;  Surgeon: [name], DPM;  Location: [place];  Service: Podiatry;  Laterality: Right;    There were no vitals filed for this visit.   Subjective Assessment - 11/20/20 1506    Subjective He arrives saying his leg feels good, no pain to report upon arrival, he has been trying to don the leg more independently but still needs help sometimes.    Pertinent  History PAD, DM2, arthritis, gout, CHF, implanted cardioverter-defib, HTN, CKD w/ ESRD    Patient Stated Goals to use prosthesis to walk in community,             [facility] Adult PT Treatment/Exercise - 11/20/20 0001      Transfers   Transfers Sit to Stand;Stand to Sit;Squat Pivot Transfers    Sit to Stand 5: Supervision;With upper extremity assist;With armrests;From chair/3-in-1;Other (comment)    Stand to Sit 5: Supervision;With upper extremity assist;With armrests;To chair/3-in-1;Other (comment)      Ambulation/Gait   Ambulation/Gait Yes    Ambulation/Gait Assistance 5: Supervision    Ambulation Distance (Feet) 100 Feet   50, 75   Assistive device Rolling walker;Prosthesis      Neuro Re-ed    Neuro Re-ed Details  balance in bars standing on foam pad with normal BOS, progressed to narrow BOS, then normal BOS with head turns      Exercises   Other Exercises  sit to stand from barstool without UE push up 3X5  reps with min guard bars. Standing hip abd with bilat UE support 2X10 bilat with red band.      Knee/Hip Exercises: Stretches   Active Hamstring Stretch Left;3 reps;30 seconds      Knee/Hip Exercises: Aerobic   Nustep seat 11 Level 5 for 10 min                    PT Short Term Goals - 11/13/20 1640      PT SHORT TERM GOAL #1   Title Patient verbalizes proper adjustment of ply socks.    Time 4    Period Weeks    Status On-going    Target Date 11/29/20      PT SHORT TERM GOAL #2   Title Patient tolerates prosthesis wear >12hrs total on nondialysis days & >3hrs after dialysis if out of bed.    Time 4    Period Weeks    Status On-going    Target Date 11/29/20      PT SHORT TERM GOAL #3   Title Patient standing balance with RW & prosthesis reaching 7" anteriorly & within 10" of floor with supervision.    Time 4    Period Weeks    Status On-going    Target Date 11/29/20      PT SHORT TERM GOAL #4   Title Patient ambulates 125' with RW & prosthesis with  supervision.    Time 4    Period Weeks    Status On-going    Target Date 11/29/20  PT SHORT TERM GOAL #5   Title Patient negotiates ramps & curbs with RW & prostheis with modA    Time 4    Period Weeks    Status On-going    Target Date 11/29/20             PT Long Term Goals - 10/30/20 1541      PT LONG TERM GOAL #1   Title Patient & family verbalize & demonstrate understanding of proper prosthetic care to enable utilization of prosthesis.    Time 12    Period Weeks    Status On-going    Target Date 12/20/20      PT LONG TERM GOAL #2   Title Patient tolerates wear of prosthesis >90% of awake hours without skin or limb discomfort issues.    Time 12    Period Weeks    Status On-going    Target Date 12/20/20      PT LONG TERM GOAL #3   Title [place] Balance >/= 36/56    Time 12    Period Weeks    Status On-going    Target Date 12/20/20      PT LONG TERM GOAL #4   Title Patient ambulates 200' with RW & prosthesis modified independent.    Time 12    Period Weeks    Status On-going    Target Date 12/20/20      PT LONG TERM GOAL #5   Title Patient negotiates ramps & curbs with RW & prosthesis with minA.    Time 12    Period Weeks    Status On-going    Target Date 12/20/20                 Plan - 11/20/20 1640    Clinical Impression Statement Had him practice donning prosthesis by himself and after verbal cues and demo he was able to sucessfully accomplish independently. He showed some improvement in standing/ambulation today and overall balance. Continue POC    Personal Factors and Comorbidities Comorbidity 3+;Fitness;Time since onset of injury/illness/exacerbation    Comorbidities PAD, DM2, arthritis, gout, CHF, implanted cardioverter-defib, HTN, CKD w/ ESRD, blind left knee    Examination-Activity Limitations Locomotion Level;Stairs;Stand;Transfers    Examination-Participation Restrictions Community Activity    Stability/Clinical Decision Making  Evolving/Moderate complexity    Rehab Potential Good    PT Frequency 2x / week    PT Duration 12 weeks    PT Treatment/Interventions ADLs/Self Care Home Management;DME Instruction;Gait training;Stair training;Functional mobility training;Therapeutic activities;Therapeutic exercise;Balance training;Neuromuscular re-education;Patient/family education;Prosthetic Training;Manual techniques;Vestibular    PT Next Visit Plan review prosthetic care,  prosthetic gait with RW including ramps & curbs,  exercise for strength, balance, flexibility & endurance.    Consulted and Agree with Plan of Care Patient;Family member/caregiver    Family Member Consulted daughter,Sherry Cira Servant           Patient will benefit from skilled therapeutic intervention in order to improve the following deficits and impairments:  Abnormal gait,Cardiopulmonary status limiting activity,Decreased activity tolerance,Decreased balance,Decreased endurance,Decreased knowledge of use of DME,Decreased mobility,Decreased range of motion,Decreased scar mobility,Decreased strength,Increased edema,Postural dysfunction,Prosthetic Dependency  Visit Diagnosis: Other abnormalities of gait and mobility  Muscle weakness (generalized)  Stiffness of right knee, not elsewhere classified  Abnormal posture  Unsteadiness on feet     Problem List Patient Active Problem List   Diagnosis Date Noted  . Polycythemia, secondary 02/21/2020  . Stable treated proliferative diabetic retinopathy of right eye determined by examination associated with type  2 diabetes mellitus ([place]) 11/29/2019  . Stable treated proliferative diabetic retinopathy of left eye without macular edema determined by examination associated with type 2 diabetes mellitus ([place]) 11/29/2019  . Advanced nonexudative age-related macular degeneration of both eyes with subfoveal involvement 11/29/2019  . 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  . Sepsis ([place]) 04/14/2019   . Hypocalcemia 04/08/2019  . Drug induced constipation   . Anemia of chronic disease   . ESRD on dialysis ([place])   . Postoperative pain   . Phantom limb pain ([place])   . S/P BKA (below knee amputation), left ([place]) 03/18/2019  . Unilateral complete BKA, left, initial encounter ([place]) 03/17/2019  . Critical lower limb ischemia ([place]) 03/12/2019  . Infection of amputation stump, right lower extremity ([place]) 03/10/2019  . Hypotension 02/23/2019  . Cellulitis of left foot 02/23/2019  . Gangrene of toe of left foot ([place]) 02/22/2019  . Complication of vascular dialysis catheter 11/24/2018  . Chronic osteomyelitis involving right ankle and foot ([place])   . Ulcer of right foot with fat layer exposed ([place]) 09/02/2018  . Encounter for planned post-operative wound closure   . Acute osteomyelitis of metatarsal bone of right foot ([place])   . Diabetic ulcer of midfoot associated with diabetes mellitus due to underlying condition, with necrosis of bone ([place])   . Osteomyelitis of right foot ([place])   . Vascular calcification   . Cellulitis and abscess of foot, except toes   . Anemia associated with chronic renal failure 07/13/2018  . Cardiomyopathy, dilated, nonischemic ([place]) 07/13/2018  . Diabetic foot infection ([place]) 07/13/2018  . PAD (peripheral artery disease) ([place])   . Macular degeneration   . Ventricular tachycardia ([place]) 06/24/2017  . Embolism due to vascular prosthetic devices, implants and grafts, initial encounter ([place]) 02/04/2016  . Dependence on renal dialysis ([place]) 05/30/2015  . Shortness of breath 02/05/2015  . Chronic systolic heart failure ([place]) 08/22/2014  . Diabetic infection of right foot ([place]) 07/13/2014  . Type 2 diabetes mellitus with diabetic peripheral angiopathy without gangrene ([place]) 03/18/2014  . Anxiety disorder, unspecified 02/23/2014  . Fever, unspecified 02/23/2014  . Headache 02/23/2014  . Pain, unspecified 02/23/2014  . Pruritus, unspecified 02/23/2014  . Mild  protein-calorie malnutrition ([place]) 12/26/2013  . ESRD (end stage renal disease) on dialysis ([place]) 11/11/2013  . Snoring 11/11/2013  . Unspecified sleep apnea 11/11/2013  . Other pancytopenia ([place]) 09/22/2013  . Hemoptysis 09/20/2013  . Personal history of nicotine dependence 07/12/2013  . Coagulation defect, unspecified ([place]) 05/16/2013  . Iron deficiency anemia, unspecified 05/06/2013  . Other disorders of plasma-protein metabolism, not elsewhere classified 04/15/2013  . 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 Other cardiomyopathies ([place]) 03/18/2013  . Secondary hyperparathyroidism of renal origin ([place]) 03/18/2013  . Type 2 diabetes mellitus without complications ([place]) 26/20/3559  . Pre-transplant evaluation for kidney transplant 01/11/2013  . Automatic implantable cardioverter-defibrillator in situ 10/01/2010  . HYPERCHOLESTEROLEMIA, MIXED 05/03/2010  . Essential hypertension 05/03/2010  . CHF 05/03/2010    Silvestre Mesi 11/20/2020, 4:42 PM  [facility] [address] [place], [place], [address] Phone: [phone]   Fax:  [phone]  Name: Timothy Faulkner MRN: [id number] Date of Birth: 1945/11/20

## 2020-11-21 DIAGNOSIS — D689 Coagulation defect, unspecified: Secondary | ICD-10-CM | POA: Diagnosis not present

## 2020-11-21 DIAGNOSIS — N2581 Secondary hyperparathyroidism of renal origin: Secondary | ICD-10-CM | POA: Diagnosis not present

## 2020-11-21 DIAGNOSIS — N186 End stage renal disease: Secondary | ICD-10-CM | POA: Diagnosis not present

## 2020-11-21 DIAGNOSIS — Z992 Dependence on renal dialysis: Secondary | ICD-10-CM | POA: Diagnosis not present

## 2020-11-22 ENCOUNTER — Other Ambulatory Visit: Payer: Self-pay

## 2020-11-22 ENCOUNTER — Ambulatory Visit (INDEPENDENT_AMBULATORY_CARE_PROVIDER_SITE_OTHER): Payer: Medicare Other | Admitting: Physical Therapy

## 2020-11-22 DIAGNOSIS — M6281 Muscle weakness (generalized): Secondary | ICD-10-CM | POA: Diagnosis not present

## 2020-11-22 DIAGNOSIS — R2681 Unsteadiness on feet: Secondary | ICD-10-CM | POA: Diagnosis not present

## 2020-11-22 DIAGNOSIS — R2689 Other abnormalities of gait and mobility: Secondary | ICD-10-CM

## 2020-11-22 DIAGNOSIS — R293 Abnormal posture: Secondary | ICD-10-CM

## 2020-11-22 DIAGNOSIS — M25661 Stiffness of right knee, not elsewhere classified: Secondary | ICD-10-CM

## 2020-11-22 NOTE — Therapy (Signed)
[facility] Physical Therapy [address] [place], [place], [address] Phone: [phone]   Fax:  [phone]  Physical Therapy Treatment  Patient Details  Name: Timothy Faulkner MRN: [id number] Date of Birth: January 21, 1946 Referring Provider (PT): Monica Martinez, MD   Encounter Date: 11/22/2020   PT End of Session - 11/22/20 1554    Visit Number 14    Number of Visits 25    Date for PT Re-Evaluation 12/24/20    Authorization Type UHC Medicare    Progress Note Due on Visit 20    PT Start Time 1431    PT Stop Time 1515    PT Time Calculation (min) 44 min    Equipment Utilized During Treatment Gait belt    Activity Tolerance Patient tolerated treatment well;Patient limited by fatigue    Behavior During Therapy [facility] for tasks assessed/performed           Past Medical History:  Diagnosis Date  . Anemia   . Arthritis    Gout  . Automatic implantable cardioverter-defibrillator in situ    [employer]  . CHF   . ESRD on dialysis [facility])    [name] 03/11/2013 (03/11/2013) dialysis M/W/F  . Gangrene ([place])    left foot  . GERD (gastroesophageal reflux disease)   . Gout    "once a year"  . Heart murmur   . HYPERCHOLESTEROLEMIA, MIXED   . HYPERTENSION   . Macular degeneration   . Osteomyelitis ([place])    right foot  . 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 Pacemaker   . Peripheral arterial disease (HCC)    left fifth toe ulcer, healing  . Pneumonia   . Shortness of breath   . 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 Wears dentures   . Wears glasses     Past Surgical History:  Procedure Laterality Date  . A/V FISTULAGRAM Left 07/20/2018   Procedure: A/V FISTULAGRAM;  Surgeon: [name], MD;  Location: [place] [facility];  Service: Cardiovascular;  Laterality: Left;  . A/V FISTULAGRAM N/A 07/15/2019   Procedure: A/V FISTULAGRAM - Left Arm;  Surgeon: Elam Dutch, MD;  Location: [place] [facility]  [facility];  Service: Cardiovascular;  Laterality: N/A;  . ABDOMINAL AORTOGRAM N/A 02/25/2019   Procedure: ABDOMINAL AORTOGRAM;  Surgeon: Elam Dutch, MD;  Location: [place] [facility];  Service: Cardiovascular;  Laterality: N/A;  . ABDOMINAL AORTOGRAM W/LOWER EXTREMITY Bilateral 07/20/2018   Procedure: ABDOMINAL AORTOGRAM W/LOWER EXTREMITY;  Surgeon: [name], MD;  Location: [place] [facility];  Service: Cardiovascular;  Laterality: Bilateral;  . AMPUTATION Right 09/07/2018   Procedure: Right fifth metatarsectomy;  Surgeon: [name], DPM;  Location: [place];  Service: Podiatry;  Laterality: Right;  . AMPUTATION Left 03/08/2019   Procedure: AMPUTATION  3RD AND 4TH TOES LEFT FOOT;  Surgeon: [name], DPM;  Location: [place];  Service: Podiatry;  Laterality: Left;  . AMPUTATION Left 03/15/2019   Procedure: AMPUTATION BELOW KNEE;  Surgeon: Elam Dutch, MD;  Location: [facility] OR;  Service: Vascular;  Laterality: Left;  . AV FISTULA PLACEMENT Right 12/13/2012   Procedure: ARTERIOVENOUS (AV) FISTULA CREATION;  Surgeon: [name], MD;  Location: [place];  Service: Vascular;  Laterality: Right;  Ultrasound guided  . AV FISTULA PLACEMENT Left 05/07/2016   Procedure: LEFT RADIOCEPHALIC ARTERIOVENOUS (AV) FISTULA CREATION;  Surgeon: [name], MD;  Location: [place];  Service:  Vascular;  Laterality: Left;  . AV FISTULA PLACEMENT Right 08/09/2019   Procedure: INSERTION OF RIGHT ARTERIOVENOUS (AV) 4-70m GORE-TEX STRETCH GRAFT RIGHT  ARM;  Surgeon: FElam Dutch MD;  [place]  Service: Vascular;  Laterality: Right;  . BASCILIC VEIN TRANSPOSITION Right 03/26/2016   Procedure: RIGHT BASILIC VEIN TRANSPOSITION;  Surgeon: TRosetta Posner MD;  [place]  Service: Vascular;  Laterality: Right;  . CARDIAC CATHETERIZATION    . CARDIAC DEFIBRILLATOR PLACEMENT     BChemical engineer . CATARACT EXTRACTION W/PHACO Bilateral   . EYE SURGERY Bilateral    Cataract  . FISTULOGRAM Left  04/22/2018   Procedure: FISTULOGRAM UPPER EXTREMITY;  Surgeon: CMarty Heck MD;  [place]  Service: Vascular;  Laterality: Left;  . I & D EXTREMITY Right 07/15/2018   Procedure: IRRIGATION AND DEBRIDEMENT RIGHT FOOT;  Surgeon: PEvelina Bucy DPM;  [place]  Service: Podiatry;  Laterality: Right;  . INCISION AND DRAINAGE ABSCESS / HEMATOMA OF BURSA / KNEE / THIGH Left 2012   "knee" (03/11/2013)  . INSERT / REPLACE / REMOVE PACEMAKER    . INSERTION OF DIALYSIS CATHETER Left 04/22/2018   Procedure: INSERTION OF DIALYSIS CATHETER;  Surgeon: CMarty Heck MD;  [place]  Service: Vascular;  Laterality: Left;  . IR FLUORO GUIDE CV LINE LEFT  07/15/2018  . IR PTA VENOUS EXCEPT DIALYSIS CIRCUIT  07/15/2018  . LOWER EXTREMITY ANGIOGRAPHY Right 07/21/2018   Procedure: LOWER EXTREMITY ANGIOGRAPHY;  Surgeon: CMarty Heck MD;  [place];  Service: Cardiovascular;  Laterality: Right;  . LOWER EXTREMITY ANGIOGRAPHY Bilateral 02/25/2019   Procedure: Lower Extremity Angiography;  Surgeon: FElam Dutch MD;  [place];  Service: Cardiovascular;  Laterality: Bilateral;  . METATARSAL HEAD EXCISION Right 07/15/2018   Procedure: METATARSAL RESECTION;  Surgeon: PEvelina Bucy DPM;  [place]  Service: Podiatry;  Laterality: Right;  . MULTIPLE TOOTH EXTRACTIONS    . PERIPHERAL VASCULAR ATHERECTOMY Right 02/28/2019   Procedure: PERIPHERAL VASCULAR ATHERECTOMY;  Surgeon: CWaynetta Sandy MD;  [place];  Service: Cardiovascular;  Laterality: Right;  right tp trunk   . PERIPHERAL VASCULAR BALLOON ANGIOPLASTY Left 02/25/2019   Procedure: PERIPHERAL VASCULAR BALLOON ANGIOPLASTY;  Surgeon: FElam Dutch MD;  [place];  Service: Cardiovascular;  Laterality: Left;  tibial peroneal trunk and PT  . PERIPHERAL VASCULAR INTERVENTION Right 07/21/2018   Procedure: PERIPHERAL VASCULAR  INTERVENTION;  Surgeon: CMarty Heck MD;  [place];  Service: Cardiovascular;  Laterality: Right;  peroneal stents  . REVISON OF ARTERIOVENOUS FISTULA Right 35/18/8416  Procedure: Plication OF Right Arm ARTERIOVENOUS FISTULA;  Surgeon: CElam Dutch MD;  [facility]  Service: Vascular;  Laterality: Right;  . REVISON OF ARTERIOVENOUS FISTULA Left 04/22/2018   Procedure: REVISION OF RADIOCEPHALIC ARTERIOVENOUS FISTULA;  Surgeon: CMarty Heck MD;  [place]  Service: Vascular;  Laterality: Left;  . SHUNTOGRAM N/A 05/31/2013   Procedure: SEarney Mallet  Surgeon: VSerafina Mitchell MD;  [facility];  Service: Cardiovascular;  Laterality: N/A;  . UPPER EXTREMITY VENOGRAPHY Right 07/23/2018   Procedure: UPPER EXTREMITY VENOGRAPHY;  Surgeon: DAngelia Mould MD;  [place];  Service: Cardiovascular;  Laterality: Right;  . UPPER EXTREMITY VENOGRAPHY  07/15/2019   Procedure: UPPER EXTREMITY VENOGRAPHY;  Surgeon: FElam Dutch MD;  [place];  Service: Cardiovascular;;  rt arm   . WOUND DEBRIDEMENT Right 07/17/2018   Procedure: Wound Debridement; Closure Filleted toe flap Right Foot;  Surgeon: [name], DPM;  Location: [place];  Service: Podiatry;  Laterality: Right;  . WOUND DEBRIDEMENT Right 09/07/2018   Procedure: Debridement Right Foot Wound, application of wound vac;  Surgeon: [name], DPM;  Location: [place];  Service: Podiatry;  Laterality: Right;  . WOUND DEBRIDEMENT Right 03/08/2019   Procedure: DEBRIDEMENT WOUND RIGHT FOOT;  Surgeon: [name], DPM;  Location: [place];  Service: Podiatry;  Laterality: Right;    There were no vitals filed for this visit.   Subjective Assessment - 11/22/20 1440    Subjective He arrives without any complaints today, states he is wearing prosthesis most of the time "for the most part", when he is awake.    Pertinent History PAD, DM2, arthritis,  gout, CHF, implanted cardioverter-defib, HTN, CKD w/ ESRD    Patient Stated Goals to use prosthesis to walk in community,            [facility] Adult PT Treatment/Exercise - 11/22/20 0001      Transfers   Transfers Sit to Stand;Stand to Sit;Squat Pivot Transfers    Sit to Stand 5: Supervision;With upper extremity assist;With armrests;From chair/3-in-1;Other (comment)    Stand to Sit 5: Supervision;With upper extremity assist;With armrests;To chair/3-in-1;Other (comment)    Stand to Sit Details 2 sets of 5      Ambulation/Gait   Ambulation/Gait Yes    Ambulation/Gait Assistance 5: Supervision    Ambulation Distance (Feet) 110 Feet   50, 75   Assistive device Rolling walker;Prosthesis    Ramp 4: Min assist   for balance as he tends to lose balance posteriorly to start   Ramp Details (indicate cue type and reason) demo, verbal & tactile cues on technique    Curb 4: Min assist    Curb Details (indicate cue type and reason) 6 inch with WR and demo, cues for technique      Neuro Re-ed    Neuro Re-ed Details  balance in RW: mod tandem balance 30 sec X2 ea, feet normal BOS and turning over his Rt shoulder and Lt shoulder X10 reps ea      Exercises   Other Exercises  sit to stand from barstool without UE push up 3X5  reps with min guard bars. Standing hip abd with bilat UE support 2X10 bilat with red band.      Knee/Hip Exercises: Stretches   Active Hamstring Stretch Left;3 reps;30 seconds      Knee/Hip Exercises: Aerobic   Nustep seat 11 Level 5 for 10 min      Knee/Hip Exercises: Machines for Strengthening   Cybex Leg Press BLEs 100# 15 reps 2 sets 1st set back 45* & 2nd set back flat                    PT Short Term Goals - 11/13/20 1640      PT SHORT TERM GOAL #1   Title Patient verbalizes proper adjustment of ply socks.    Time 4    Period Weeks    Status On-going    Target Date 11/29/20      PT SHORT TERM GOAL #2   Title Patient tolerates prosthesis wear >12hrs  total on nondialysis days & >3hrs after dialysis if out of bed.    Time 4    Period Weeks    Status  On-going    Target Date 11/29/20      PT SHORT TERM GOAL #3   Title Patient standing balance with RW & prosthesis reaching 7" anteriorly & within 10" of floor with supervision.    Time 4    Period Weeks    Status On-going    Target Date 11/29/20      PT SHORT TERM GOAL #4   Title Patient ambulates 125' with RW & prosthesis with supervision.    Time 4    Period Weeks    Status On-going    Target Date 11/29/20      PT SHORT TERM GOAL #5   Title Patient negotiates ramps & curbs with RW & prostheis with modA    Time 4    Period Weeks    Status On-going    Target Date 11/29/20             PT Long Term Goals - 10/30/20 1541      PT LONG TERM GOAL #1   Title Patient & family verbalize & demonstrate understanding of proper prosthetic care to enable utilization of prosthesis.    Time 12    Period Weeks    Status On-going    Target Date 12/20/20      PT LONG TERM GOAL #2   Title Patient tolerates wear of prosthesis >90% of awake hours without skin or limb discomfort issues.    Time 12    Period Weeks    Status On-going    Target Date 12/20/20      PT LONG TERM GOAL #3   Title Berg Balance >/= 36/56    Time 12    Period Weeks    Status On-going    Target Date 12/20/20      PT LONG TERM GOAL #4   Title Patient ambulates 200' with RW & prosthesis modified independent.    Time 12    Period Weeks    Status On-going    Target Date 12/20/20      PT LONG TERM GOAL #5   Title Patient negotiates ramps & curbs with RW & prosthesis with minA.    Time 12    Period Weeks    Status On-going    Target Date 12/20/20                 Plan - 11/22/20 1555    Clinical Impression Statement Session focused on functional mobility with ambulation, and negotiating ramps, curbs, and then overall balance. He is progressing well with PT and recommending about 4 more weeks of PT  which should allow him to meet his functional goals.    Personal Factors and Comorbidities Comorbidity 3+;Fitness;Time since onset of injury/illness/exacerbation    Comorbidities PAD, DM2, arthritis, gout, CHF, implanted cardioverter-defib, HTN, CKD w/ ESRD, blind left knee    Examination-Activity Limitations Locomotion Level;Stairs;Stand;Transfers    Examination-Participation Restrictions Community Activity    Stability/Clinical Decision Making Evolving/Moderate complexity    Rehab Potential Good    PT Frequency 2x / week    PT Duration 12 weeks    PT Treatment/Interventions ADLs/Self Care Home Management;DME Instruction;Gait training;Stair training;Functional mobility training;Therapeutic activities;Therapeutic exercise;Balance training;Neuromuscular re-education;Patient/family education;Prosthetic Training;Manual techniques;Vestibular    PT Next Visit Plan review prosthetic care,  prosthetic gait with RW including ramps & curbs,  exercise for strength, balance, flexibility & endurance.    Consulted and Agree with Plan of Care Patient;Family member/caregiver    Family Member Consulted daughter,[employer]  Patient will benefit from skilled therapeutic intervention in order to improve the following deficits and impairments:  Abnormal gait,Cardiopulmonary status limiting activity,Decreased activity tolerance,Decreased balance,Decreased endurance,Decreased knowledge of use of DME,Decreased mobility,Decreased range of motion,Decreased scar mobility,Decreased strength,Increased edema,Postural dysfunction,Prosthetic Dependency  Visit Diagnosis: Other abnormalities of gait and mobility  Muscle weakness (generalized)  Stiffness of right knee, not elsewhere classified  Abnormal posture  Unsteadiness on feet     Problem List Patient Active Problem List   Diagnosis Date Noted  . Polycythemia, secondary 02/21/2020  . Stable treated proliferative diabetic retinopathy of right  eye determined by examination associated with type 2 diabetes mellitus ([place]) 11/29/2019  . Stable treated proliferative diabetic retinopathy of left eye without macular edema determined by examination associated with type 2 diabetes mellitus ([place]) 11/29/2019  . Advanced nonexudative age-related macular degeneration of both eyes with subfoveal involvement 11/29/2019  . 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  . Sepsis ([place]) 04/14/2019  . Hypocalcemia 04/08/2019  . Drug induced constipation   . Anemia of chronic disease   . ESRD on dialysis ([place])   . Postoperative pain   . Phantom limb pain ([place])   . S/P BKA (below knee amputation), left ([place]) 03/18/2019  . Unilateral complete BKA, left, initial encounter ([place]) 03/17/2019  . Critical lower limb ischemia ([place]) 03/12/2019  . Infection of amputation stump, right lower extremity ([place]) 03/10/2019  . Hypotension 02/23/2019  . Cellulitis of left foot 02/23/2019  . Gangrene of toe of left foot ([place]) 02/22/2019  . Complication of vascular dialysis catheter 11/24/2018  . Chronic osteomyelitis involving right ankle and foot ([place])   . Ulcer of right foot with fat layer exposed ([place]) 09/02/2018  . Encounter for planned post-operative wound closure   . Acute osteomyelitis of metatarsal bone of right foot ([place])   . Diabetic ulcer of midfoot associated with diabetes mellitus due to underlying condition, with necrosis of bone ([place])   . Osteomyelitis of right foot ([place])   . Vascular calcification   . Cellulitis and abscess of foot, except toes   . Anemia associated with chronic renal failure 07/13/2018  . Cardiomyopathy, dilated, nonischemic ([place]) 07/13/2018  . Diabetic foot infection ([place]) 07/13/2018  . PAD (peripheral artery disease) ([place])   . Macular degeneration   . Ventricular tachycardia ([place]) 06/24/2017  . Embolism due to vascular prosthetic devices, implants and grafts, initial encounter ([place]) 02/04/2016  . Dependence on renal  dialysis ([place]) 05/30/2015  . Shortness of breath 02/05/2015  . Chronic systolic heart failure ([place]) 08/22/2014  . Diabetic infection of right foot ([place]) 07/13/2014  . Type 2 diabetes mellitus with diabetic peripheral angiopathy without gangrene ([place]) 03/18/2014  . Anxiety disorder, unspecified 02/23/2014  . Fever, unspecified 02/23/2014  . Headache 02/23/2014  . Pain, unspecified 02/23/2014  . Pruritus, unspecified 02/23/2014  . Mild protein-calorie malnutrition ([place]) 12/26/2013  . ESRD (end stage renal disease) on dialysis ([place]) 11/11/2013  . Snoring 11/11/2013  . Unspecified sleep apnea 11/11/2013  . Other pancytopenia ([place]) 09/22/2013  . Hemoptysis 09/20/2013  . Personal history of nicotine dependence 07/12/2013  . Coagulation defect, unspecified ([employer]) 05/16/2013  . Iron deficiency anemia, unspecified 05/06/2013  . Other disorders of plasma-protein metabolism, not elsewhere classified 04/15/2013  . 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 chronic kidney disease or end stage renal disease ([place]) 03/18/2013  . Other cardiomyopathies ([place]) 03/18/2013  . Secondary hyperparathyroidism of renal origin ([place]) 03/18/2013  . Type 2 diabetes mellitus without complications ([place]) 77/41/2878  . Pre-transplant evaluation  for kidney transplant 01/11/2013  . Automatic implantable cardioverter-defibrillator in situ 10/01/2010  . HYPERCHOLESTEROLEMIA, MIXED 05/03/2010  . Essential hypertension 05/03/2010  . CHF 05/03/2010    Silvestre Mesi 11/22/2020, 3:57 PM  [facility] Physical Therapy [address] [place], [place], [address] Phone: [phone]   Fax:  [phone]  Name: BALEN WOOLUM MRN: [id number] Date of Birth: [birth year]

## 2020-11-23 DIAGNOSIS — N186 End stage renal disease: Secondary | ICD-10-CM | POA: Diagnosis not present

## 2020-11-23 DIAGNOSIS — D689 Coagulation defect, unspecified: Secondary | ICD-10-CM | POA: Diagnosis not present

## 2020-11-23 DIAGNOSIS — N2581 Secondary hyperparathyroidism of renal origin: Secondary | ICD-10-CM | POA: Diagnosis not present

## 2020-11-23 DIAGNOSIS — Z992 Dependence on renal dialysis: Secondary | ICD-10-CM | POA: Diagnosis not present

## 2020-11-26 ENCOUNTER — Other Ambulatory Visit (INDEPENDENT_AMBULATORY_CARE_PROVIDER_SITE_OTHER): Payer: Self-pay

## 2020-11-26 DIAGNOSIS — E113552 Type 2 diabetes mellitus with stable proliferative diabetic retinopathy, left eye: Secondary | ICD-10-CM

## 2020-11-26 DIAGNOSIS — N2581 Secondary hyperparathyroidism of renal origin: Secondary | ICD-10-CM | POA: Diagnosis not present

## 2020-11-26 DIAGNOSIS — Z992 Dependence on renal dialysis: Secondary | ICD-10-CM | POA: Diagnosis not present

## 2020-11-26 DIAGNOSIS — E113551 Type 2 diabetes mellitus with stable proliferative diabetic retinopathy, right eye: Secondary | ICD-10-CM

## 2020-11-26 DIAGNOSIS — E119 Type 2 diabetes mellitus without complications: Secondary | ICD-10-CM | POA: Diagnosis not present

## 2020-11-26 DIAGNOSIS — D689 Coagulation defect, unspecified: Secondary | ICD-10-CM | POA: Diagnosis not present

## 2020-11-26 DIAGNOSIS — N186 End stage renal disease: Secondary | ICD-10-CM | POA: Diagnosis not present

## 2020-11-28 DIAGNOSIS — D689 Coagulation defect, unspecified: Secondary | ICD-10-CM | POA: Diagnosis not present

## 2020-11-28 DIAGNOSIS — N186 End stage renal disease: Secondary | ICD-10-CM | POA: Diagnosis not present

## 2020-11-28 DIAGNOSIS — E119 Type 2 diabetes mellitus without complications: Secondary | ICD-10-CM | POA: Diagnosis not present

## 2020-11-28 DIAGNOSIS — Z992 Dependence on renal dialysis: Secondary | ICD-10-CM | POA: Diagnosis not present

## 2020-11-28 DIAGNOSIS — N2581 Secondary hyperparathyroidism of renal origin: Secondary | ICD-10-CM | POA: Diagnosis not present

## 2020-11-30 DIAGNOSIS — Z992 Dependence on renal dialysis: Secondary | ICD-10-CM | POA: Diagnosis not present

## 2020-11-30 DIAGNOSIS — E119 Type 2 diabetes mellitus without complications: Secondary | ICD-10-CM | POA: Diagnosis not present

## 2020-11-30 DIAGNOSIS — N186 End stage renal disease: Secondary | ICD-10-CM | POA: Diagnosis not present

## 2020-11-30 DIAGNOSIS — N2581 Secondary hyperparathyroidism of renal origin: Secondary | ICD-10-CM | POA: Diagnosis not present

## 2020-11-30 DIAGNOSIS — D689 Coagulation defect, unspecified: Secondary | ICD-10-CM | POA: Diagnosis not present

## 2020-12-03 ENCOUNTER — Ambulatory Visit (INDEPENDENT_AMBULATORY_CARE_PROVIDER_SITE_OTHER): Payer: Medicare Other | Admitting: Otolaryngology

## 2020-12-03 DIAGNOSIS — N2581 Secondary hyperparathyroidism of renal origin: Secondary | ICD-10-CM | POA: Diagnosis not present

## 2020-12-03 DIAGNOSIS — D689 Coagulation defect, unspecified: Secondary | ICD-10-CM | POA: Diagnosis not present

## 2020-12-03 DIAGNOSIS — Z992 Dependence on renal dialysis: Secondary | ICD-10-CM | POA: Diagnosis not present

## 2020-12-03 DIAGNOSIS — N186 End stage renal disease: Secondary | ICD-10-CM | POA: Diagnosis not present

## 2020-12-04 ENCOUNTER — Encounter: Payer: Medicare Other | Admitting: Physical Therapy

## 2020-12-05 DIAGNOSIS — N2581 Secondary hyperparathyroidism of renal origin: Secondary | ICD-10-CM | POA: Diagnosis not present

## 2020-12-05 DIAGNOSIS — N186 End stage renal disease: Secondary | ICD-10-CM | POA: Diagnosis not present

## 2020-12-05 DIAGNOSIS — Z992 Dependence on renal dialysis: Secondary | ICD-10-CM | POA: Diagnosis not present

## 2020-12-05 DIAGNOSIS — D689 Coagulation defect, unspecified: Secondary | ICD-10-CM | POA: Diagnosis not present

## 2020-12-06 ENCOUNTER — Encounter: Payer: Medicare Other | Admitting: Physical Therapy

## 2020-12-07 DIAGNOSIS — N2581 Secondary hyperparathyroidism of renal origin: Secondary | ICD-10-CM | POA: Diagnosis not present

## 2020-12-07 DIAGNOSIS — N186 End stage renal disease: Secondary | ICD-10-CM | POA: Diagnosis not present

## 2020-12-07 DIAGNOSIS — Z992 Dependence on renal dialysis: Secondary | ICD-10-CM | POA: Diagnosis not present

## 2020-12-07 DIAGNOSIS — D689 Coagulation defect, unspecified: Secondary | ICD-10-CM | POA: Diagnosis not present

## 2020-12-08 DIAGNOSIS — I12 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 chronic kidney disease or end stage renal disease: Secondary | ICD-10-CM | POA: Diagnosis not present

## 2020-12-08 DIAGNOSIS — Z992 Dependence on renal dialysis: Secondary | ICD-10-CM | POA: Diagnosis not present

## 2020-12-08 DIAGNOSIS — N186 End stage renal disease: Secondary | ICD-10-CM | POA: Diagnosis not present

## 2020-12-10 DIAGNOSIS — Z992 Dependence on renal dialysis: Secondary | ICD-10-CM | POA: Diagnosis not present

## 2020-12-10 DIAGNOSIS — N2581 Secondary hyperparathyroidism of renal origin: Secondary | ICD-10-CM | POA: Diagnosis not present

## 2020-12-10 DIAGNOSIS — N186 End stage renal disease: Secondary | ICD-10-CM | POA: Diagnosis not present

## 2020-12-10 DIAGNOSIS — D689 Coagulation defect, unspecified: Secondary | ICD-10-CM | POA: Diagnosis not present

## 2020-12-11 ENCOUNTER — Other Ambulatory Visit: Payer: Self-pay

## 2020-12-11 ENCOUNTER — Ambulatory Visit (INDEPENDENT_AMBULATORY_CARE_PROVIDER_SITE_OTHER): Payer: Medicare Other | Admitting: Podiatry

## 2020-12-11 DIAGNOSIS — L97511 Non-pressure chronic ulcer of other part of right foot limited to breakdown of skin: Secondary | ICD-10-CM

## 2020-12-11 DIAGNOSIS — I739 Peripheral vascular disease, unspecified: Secondary | ICD-10-CM

## 2020-12-11 NOTE — Progress Notes (Signed)
  Subjective:  Patient ID: Timothy Faulkner, male    DOB: 07-Oct-1945,  MRN: [id number]  Chief Complaint  Patient presents with  . Wound Check    Reports no signs of infection and denies any concerns today.     75 y.o. male presents for wound care. Hx confirmed with patient.  Objective:  Physical Exam: Wound Location: right 2nd toe, 4th toe Wound Measurement: 0.2x0x2, 0.3.x0.2  Wound Base: Mixed Granular/Fibrotic Peri-wound: Normal Exudate: None: wound tissue dry wound without warmth, erythema, signs of acute infection  Assessment:   1. Skin ulcer of fourth toe, right, limited to breakdown of skin ([place])   2. PAD (peripheral artery disease) ([place])    Plan:  Patient was evaluated and treated and all questions answered.  Ulcer right 2nd/4th toes -Healing, albeit slowly -Continue surgical shoe -Continue santyl. Dressed with SSD and band-aid today -F/u in 3 weeks for recheck.  Return in about 3 weeks (around 01/01/2021) for Wound Care.

## 2020-12-12 DIAGNOSIS — Z992 Dependence on renal dialysis: Secondary | ICD-10-CM | POA: Diagnosis not present

## 2020-12-12 DIAGNOSIS — D689 Coagulation defect, unspecified: Secondary | ICD-10-CM | POA: Diagnosis not present

## 2020-12-12 DIAGNOSIS — N186 End stage renal disease: Secondary | ICD-10-CM | POA: Diagnosis not present

## 2020-12-12 DIAGNOSIS — N2581 Secondary hyperparathyroidism of renal origin: Secondary | ICD-10-CM | POA: Diagnosis not present

## 2020-12-12 NOTE — Progress Notes (Signed)
  Subjective:  Patient ID: Timothy Faulkner, male    DOB: [birth year],  MRN: [id number]  Chief Complaint  Patient presents with  . Wound Check    2 week fu wound care    75 y.o. male presents for wound care. Hx confirmed with patient.  Objective:  Physical Exam: Wound Location: right 2nd/4th toes Wound Measurement: 0.2x0.2 Wound Base: Mixed Granular/Fibrotic Peri-wound: Normal Exudate: None: wound tissue dry wound without warmth, erythema, signs of acute infection   Assessment:   1. Skin ulcer of fourth toe, right, limited to breakdown of skin [facility])      Plan:  Patient was evaluated and treated and all questions answered.  Ulcer right 2nd/4th toes -Continue to heal well.  -Continue santyl daily.  Return in about 3 weeks (around 12/11/2020) for Wound Care.

## 2020-12-14 DIAGNOSIS — N186 End stage renal disease: Secondary | ICD-10-CM | POA: Diagnosis not present

## 2020-12-14 DIAGNOSIS — D689 Coagulation defect, unspecified: Secondary | ICD-10-CM | POA: Diagnosis not present

## 2020-12-14 DIAGNOSIS — N2581 Secondary hyperparathyroidism of renal origin: Secondary | ICD-10-CM | POA: Diagnosis not present

## 2020-12-14 DIAGNOSIS — Z992 Dependence on renal dialysis: Secondary | ICD-10-CM | POA: Diagnosis not present

## 2020-12-14 NOTE — Progress Notes (Signed)
  Subjective:  Patient ID: Timothy Faulkner, male    DOB: 04-05-1946,  MRN: [id number]  Chief Complaint  Patient presents with  . wound care    Wound care -right foot- drainage- no pain- pt states foot is " rock hard & cold " - bleeding ( not much )   75 y.o. male presents for wound care. Hx confirmed with patient.   Objective:  Physical Exam: Wound Location: right 4th toe Wound Measurement: 0.2x0.2  Wound Base: Mixed Granular/Fibrotic Peri-wound: Normal Exudate: None: wound tissue dry wound without warmth, erythema, signs of acute infection  Wound Location: right 2nd toe Wound Measurement:0.3x0.3  Wound Base: Mixed Granular/Fibrotic Peri-wound: Normal Exudate: None: wound tissue dry wound without warmth, erythema, signs of acute infection  Foot warm but with some excessive distal cooling.  Capillary fill present to all digits Assessment:   1. Skin ulcer of fourth toe, right, limited to breakdown of skin ([place])   2. PAD (peripheral artery disease) ([place])    Plan:  Patient was evaluated and treated and all questions answered.  Ulcer Right foot -Wound worsened to the second toe. -Discontinue diabetic shoes for now and wear surgical shoe -Minimally debrided.  Dressed with Medihoney and Band-Aid -Recommend Santyl dressing changes daily -Recommend follow-up with vascular  No follow-ups on file.

## 2020-12-17 DIAGNOSIS — N186 End stage renal disease: Secondary | ICD-10-CM | POA: Diagnosis not present

## 2020-12-17 DIAGNOSIS — D689 Coagulation defect, unspecified: Secondary | ICD-10-CM | POA: Diagnosis not present

## 2020-12-17 DIAGNOSIS — N2581 Secondary hyperparathyroidism of renal origin: Secondary | ICD-10-CM | POA: Diagnosis not present

## 2020-12-17 DIAGNOSIS — Z992 Dependence on renal dialysis: Secondary | ICD-10-CM | POA: Diagnosis not present

## 2020-12-18 ENCOUNTER — Ambulatory Visit (INDEPENDENT_AMBULATORY_CARE_PROVIDER_SITE_OTHER): Payer: Medicare Other | Admitting: Physical Therapy

## 2020-12-18 ENCOUNTER — Encounter: Payer: Self-pay | Admitting: Physical Therapy

## 2020-12-18 ENCOUNTER — Other Ambulatory Visit: Payer: Self-pay

## 2020-12-18 ENCOUNTER — Encounter: Payer: Medicare Other | Admitting: Physical Therapy

## 2020-12-18 DIAGNOSIS — R2681 Unsteadiness on feet: Secondary | ICD-10-CM

## 2020-12-18 DIAGNOSIS — M25661 Stiffness of right knee, not elsewhere classified: Secondary | ICD-10-CM

## 2020-12-18 DIAGNOSIS — M6281 Muscle weakness (generalized): Secondary | ICD-10-CM | POA: Diagnosis not present

## 2020-12-18 DIAGNOSIS — R2689 Other abnormalities of gait and mobility: Secondary | ICD-10-CM | POA: Diagnosis not present

## 2020-12-18 DIAGNOSIS — R293 Abnormal posture: Secondary | ICD-10-CM | POA: Diagnosis not present

## 2020-12-18 NOTE — Therapy (Signed)
[facility] Physical Therapy [address] [place], [place], [address] Phone: [phone]   Fax:  [phone]  Physical Therapy Treatment & Recertification  Patient Details  Name: Timothy Faulkner MRN: [id number] Date of Birth: 1946/06/17 Referring Provider (PT): Monica Martinez, MD   Encounter Date: 12/18/2020   PT End of Session - 12/18/20 1517    Visit Number 15    Number of Visits 39    Date for PT Re-Evaluation 12/24/20    Authorization Type UHC Medicare    Progress Note Due on Visit 20    PT Start Time 1515    PT Stop Time 1600    PT Time Calculation (min) 45 min    Equipment Utilized During Treatment Gait belt    Activity Tolerance Patient tolerated treatment well;Patient limited by fatigue    Behavior During Therapy [facility] for tasks assessed/performed           Past Medical History:  Diagnosis Date  . Anemia   . Arthritis    Gout  . Automatic implantable cardioverter-defibrillator in situ    [employer]  . CHF   . ESRD on dialysis [facility])    [name] 03/11/2013 (03/11/2013) dialysis M/W/F  . Gangrene ([place])    left foot  . GERD (gastroesophageal reflux disease)   . Gout    "once a year"  . Heart murmur   . HYPERCHOLESTEROLEMIA, MIXED   . HYPERTENSION   . Macular degeneration   . Osteomyelitis ([place])    right foot  . 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 Pacemaker   . Peripheral arterial disease (HCC)    left fifth toe ulcer, healing  . Pneumonia   . Shortness of breath   . 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 Wears dentures   . Wears glasses     Past Surgical History:  Procedure Laterality Date  . A/V FISTULAGRAM Left 07/20/2018   Procedure: A/V FISTULAGRAM;  Surgeon: [name], MD;  Location: [place] CV LAB;  Service: Cardiovascular;  Laterality: Left;  . A/V FISTULAGRAM N/A 07/15/2019   Procedure: A/V FISTULAGRAM - Left Arm;  Surgeon: Elam Dutch, MD;   Location: [place] CV LAB;  Service: Cardiovascular;  Laterality: N/A;  . ABDOMINAL AORTOGRAM N/A 02/25/2019   Procedure: ABDOMINAL AORTOGRAM;  Surgeon: Elam Dutch, MD;  Location: [place] CV LAB;  Service: Cardiovascular;  Laterality: N/A;  . ABDOMINAL AORTOGRAM W/LOWER EXTREMITY Bilateral 07/20/2018   Procedure: ABDOMINAL AORTOGRAM W/LOWER EXTREMITY;  Surgeon: [name], MD;  Location: [place] CV LAB;  Service: Cardiovascular;  Laterality: Bilateral;  . AMPUTATION Right 09/07/2018   Procedure: Right fifth metatarsectomy;  Surgeon: [name], DPM;  Location: [place];  Service: Podiatry;  Laterality: Right;  . AMPUTATION Left 03/08/2019   Procedure: AMPUTATION  3RD AND 4TH TOES LEFT FOOT;  Surgeon: [name], DPM;  Location: [place];  Service: Podiatry;  Laterality: Left;  . AMPUTATION Left 03/15/2019   Procedure: AMPUTATION BELOW KNEE;  Surgeon: Elam Dutch, MD;  Location[facility] OR;  Service: Vascular;  Laterality: Left;  . AV FISTULA PLACEMENT Right 12/13/2012   Procedure: ARTERIOVENOUS (AV) FISTULA CREATION;  Surgeon: [name], MD;  Location: [place];  Service: Vascular;  Laterality: Right;  Ultrasound guided  . AV FISTULA PLACEMENT Left 05/07/2016   Procedure: LEFT RADIOCEPHALIC ARTERIOVENOUS (AV) FISTULA CREATION;  Surgeon: [name], MD;  Location: [place];  Service: Vascular;  Laterality: Left;  . AV FISTULA PLACEMENT Right 08/09/2019   Procedure: INSERTION OF RIGHT ARTERIOVENOUS (AV) 4-20m GORE-TEX STRETCH GRAFT RIGHT  ARM;  Surgeon: FElam Dutch MD;  [place]  Service: Vascular;  Laterality: Right;  . BASCILIC VEIN TRANSPOSITION Right 03/26/2016   Procedure: RIGHT BASILIC VEIN TRANSPOSITION;  Surgeon: TRosetta Posner MD;  [place]  Service: Vascular;  Laterality: Right;  . CARDIAC CATHETERIZATION    . CARDIAC DEFIBRILLATOR PLACEMENT     BChemical engineer . CATARACT EXTRACTION W/PHACO Bilateral   . EYE SURGERY Bilateral    Cataract   . FISTULOGRAM Left 04/22/2018   Procedure: FISTULOGRAM UPPER EXTREMITY;  Surgeon: CMarty Heck MD;  [place]  Service: Vascular;  Laterality: Left;  . I & D EXTREMITY Right 07/15/2018   Procedure: IRRIGATION AND DEBRIDEMENT RIGHT FOOT;  Surgeon: PEvelina Bucy DPM;  [place]  Service: Podiatry;  Laterality: Right;  . INCISION AND DRAINAGE ABSCESS / HEMATOMA OF BURSA / KNEE / THIGH Left 2012   "knee" (03/11/2013)  . INSERT / REPLACE / REMOVE PACEMAKER    . INSERTION OF DIALYSIS CATHETER Left 04/22/2018   Procedure: INSERTION OF DIALYSIS CATHETER;  Surgeon: CMarty Heck MD;  [place]  Service: Vascular;  Laterality: Left;  . IR FLUORO GUIDE CV LINE LEFT  07/15/2018  . IR PTA VENOUS EXCEPT DIALYSIS CIRCUIT  07/15/2018  . LOWER EXTREMITY ANGIOGRAPHY Right 07/21/2018   Procedure: LOWER EXTREMITY ANGIOGRAPHY;  Surgeon: CMarty Heck MD;  [place];  Service: Cardiovascular;  Laterality: Right;  . LOWER EXTREMITY ANGIOGRAPHY Bilateral 02/25/2019   Procedure: Lower Extremity Angiography;  Surgeon: FElam Dutch MD;  [place];  Service: Cardiovascular;  Laterality: Bilateral;  . METATARSAL HEAD EXCISION Right 07/15/2018   Procedure: METATARSAL RESECTION;  Surgeon: PEvelina Bucy DPM;  [place]  Service: Podiatry;  Laterality: Right;  . MULTIPLE TOOTH EXTRACTIONS    . PERIPHERAL VASCULAR ATHERECTOMY Right 02/28/2019   Procedure: PERIPHERAL VASCULAR ATHERECTOMY;  Surgeon: CWaynetta Sandy MD;  [place];  Service: Cardiovascular;  Laterality: Right;  right tp trunk   . PERIPHERAL VASCULAR BALLOON ANGIOPLASTY Left 02/25/2019   Procedure: PERIPHERAL VASCULAR BALLOON ANGIOPLASTY;  Surgeon: FElam Dutch MD;  [place];  Service: Cardiovascular;  Laterality: Left;  tibial peroneal trunk and PT  . PERIPHERAL VASCULAR INTERVENTION Right 07/21/2018   Procedure:  PERIPHERAL VASCULAR INTERVENTION;  Surgeon: CMarty Heck MD;  [place];  Service: Cardiovascular;  Laterality: Right;  peroneal stents  . REVISON OF ARTERIOVENOUS FISTULA Right 32/77/8242  Procedure: Plication OF Right Arm ARTERIOVENOUS FISTULA;  Surgeon: CElam Dutch MD;  [facility]  Service: Vascular;  Laterality: Right;  . REVISON OF ARTERIOVENOUS FISTULA Left 04/22/2018   Procedure: REVISION OF RADIOCEPHALIC ARTERIOVENOUS FISTULA;  Surgeon: CMarty Heck MD;  [place]  Service: Vascular;  Laterality: Left;  . SHUNTOGRAM N/A 05/31/2013   Procedure: SEarney Mallet  Surgeon: VSerafina Mitchell MD;  [facility];  Service: Cardiovascular;  Laterality: N/A;  . UPPER EXTREMITY VENOGRAPHY Right 07/23/2018   Procedure: UPPER EXTREMITY VENOGRAPHY;  Surgeon: DAngelia Mould MD;  [place] [facility];  Service: Cardiovascular;  Laterality: Right;  . UPPER EXTREMITY VENOGRAPHY  07/15/2019   Procedure: UPPER EXTREMITY VENOGRAPHY;  Surgeon: FElam Dutch MD;  [facility];  Service: Cardiovascular;;  rt arm   . WOUND DEBRIDEMENT Right 07/17/2018   Procedure: Wound Debridement; Closure Filleted toe flap Right Foot;  Surgeon: [name], DPM;  Location: [place];  Service: Podiatry;  Laterality: Right;  . WOUND DEBRIDEMENT Right 09/07/2018   Procedure: Debridement Right Foot Wound, application of wound vac;  Surgeon: [name], DPM;  Location: [place];  Service: Podiatry;  Laterality: Right;  . WOUND DEBRIDEMENT Right 03/08/2019   Procedure: DEBRIDEMENT WOUND RIGHT FOOT;  Surgeon: [name], DPM;  Location: [place];  Service: Podiatry;  Laterality: Right;    There were no vitals filed for this visit.   Subjective Assessment - 12/18/20 1515    Subjective He is wearing prosthesis from 1-2 hours after arising until bathing which is 1-2 hours before bed. He does wear it to dialysis. His daughter reports  that he spaces out sometimes like after dialysis or has been sitting for >30 min. She is concerned that he will pass out or fall.    Pertinent History PAD, DM2, arthritis, gout, CHF, implanted cardioverter-defib, HTN, CKD w/ ESRD    Patient Stated Goals to use prosthesis to walk in community,    Currently in Pain? No/denies              [facility] PT Assessment - 12/18/20 1515      Assessment   Medical Diagnosis Left Transtibial Amputation    Referring Provider (PT) Monica Martinez, MD      Berg Balance Test   Sit to Stand Needs minimal aid to stand or to stabilize   with RW = 3   Standing Unsupported Needs several tries to stand 30 seconds unsupported   with RW = 3   Sitting with Back Unsupported but Feet Supported on Floor or Stool Able to sit safely and securely 2 minutes    Stand to Sit Sits independently, has uncontrolled descent   with RW = 3   Transfers Able to transfer safely, definite need of hands    Standing Unsupported with Eyes Closed Unable to keep eyes closed 3 seconds but stays steady   with RW = 3   Standing Unsupported with Feet Together Needs help to attain position and unable to hold for 15 seconds   with RW = 2   From Standing, Reach Forward with Outstretched Arm Reaches forward but needs supervision   with RW = 2   From Standing Position, Pick up Object from Floor Unable to try/needs assist to keep balance   with RW = 1   From Standing Position, Turn to Look Behind Over each Shoulder Needs assist to keep from losing balance and falling   with RW = 1   Turn 360 Degrees Needs assistance while turning   with RW = 1   Standing Unsupported, Alternately Place Feet on Step/Stool Needs assistance to keep from falling or unable to try   with RW = 2   Standing Unsupported, One Foot in Front Loses balance while stepping or standing   with RW = 2   Standing on One Leg Unable to try or needs assist to prevent fall   with RW = 2   Total Score 12    Berg comment: tasks of Berg  with RW support 32/56               Vestibular Assessment - 12/18/20 1515      Orthostatics   BP supine (x 5 minutes) 90/67  HR supine (x 5 minutes) 75    BP sitting 65/45   light headed & shakey   HR sitting 81    BP standing (after 1 minute) 62/42    HR standing (after 1 minute) 84    BP standing (after 3 minutes) 95/70    HR standing (after 3 minutes) 87                    [facility] Adult PT Treatment/Exercise - 12/18/20 1515      Transfers   Transfers Sit to Stand;Stand to Sit    Sit to Stand 5: Supervision;With upper extremity assist;With armrests;From chair/3-in-1;Other (comment)   to RW   Stand to Sit 5: Supervision;With upper extremity assist;With armrests;To chair/3-in-1;Other (comment)   from RW     Ambulation/Gait   Ambulation/Gait Yes    Ambulation/Gait Assistance 5: Supervision    Ambulation/Gait Assistance Details PT demo & instructed dtr how to safely follow with w/c to enable pt to walk at least 1x/day to his max tolerable distance. She verblizes understanding.    Ambulation Distance (Feet) 154 Feet   max tolerable distance   Assistive device Rolling walker;Prosthesis    Gait Pattern Step-to pattern;Decreased step length - right;Decreased stance time - left;Decreased stride length;Decreased hip/knee flexion - left;Decreased weight shift to left;Left hip hike;Trunk flexed;Abducted - left;Poor foot clearance - right    Ambulation Surface Level;Indoor      Knee/Hip Exercises: Aerobic   Nustep seat 11 Level 5 for 8 min      Prosthetics   Current prosthetic wear tolerance (days/week)  daily including dialysis    Current prosthetic wear tolerance (#hours/day)  ~80% of awake hours    Edema pitting edema    Residual limb condition  No open areas, dry skin, normal temperature, normal color, cylinerical shape    Donning Prosthesis Modified independent (device/increased time)                    PT Short Term Goals - 12/18/20 1653      PT  SHORT TERM GOAL #1   Title Patient verbalizes proper adjustment of ply socks.    Time 4    Period Weeks    Status On-going    Target Date 01/17/21      PT SHORT TERM GOAL #2   Title Patient verbalize prosthesis wear within 69mn of arising in mornings.    Time 4    Period Weeks    Status Revised    Target Date 01/17/21      PT SHORT TERM GOAL #3   Title Patient standing balance with RW & prosthesis reaching 10" anteriorly & within 5" of floor with supervision.    Time 4    Period Weeks    Status Revised    Target Date 01/17/21      PT SHORT TERM GOAL #4   Title Patient ambulates 121 with RW & prosthesis with supervision.    Time 4    Period Weeks    Status Revised    Target Date 01/17/21      PT SHORT TERM GOAL #5   Title Patient negotiates ramps & curbs with RW & prostheis with modA    Time 4    Period Weeks    Status On-going    Target Date 01/17/21             PT Long Term Goals - 12/18/20 1649      PT LONG  TERM GOAL #1   Title Patient & family verbalize & demonstrate understanding of proper prosthetic care to enable utilization of prosthesis.    Time 12    Period Weeks    Status Partially Met      PT LONG TERM GOAL #2   Title Patient tolerates wear of prosthesis >90% of awake hours without skin or limb discomfort issues.    Time 12    Period Weeks    Status Partially Met      PT LONG TERM GOAL #3   Title Berg Balance >/= 36/56    Baseline NOT MET 12/18/2020    Time 12    Period Weeks    Status Not Met      PT LONG TERM GOAL #4   Title Patient ambulates 200' with RW & prosthesis modified independent.    Baseline NOT MET 12/18/2020    Time 12    Period Weeks    Status Not Met      PT LONG TERM GOAL #5   Title Patient negotiates ramps & curbs with RW & prosthesis with minA.    Baseline NOT MET 12/18/2020    Time 12    Period Weeks    Status Not Met              PT Long Term Goals - 12/18/20 1658      PT LONG TERM GOAL #1   Title  Patient & family verbalize & demonstrate understanding of proper prosthetic care to enable utilization of prosthesis.    Time 12    Period Weeks    Status On-going    Target Date 03/14/21      PT LONG TERM GOAL #2   Title Patient tolerates wear of prosthesis >90% of awake hours without skin or limb discomfort issues.    Time 12    Period Weeks    Status On-going    Target Date 03/14/21      PT LONG TERM GOAL #3   Title Tasks of Oceanographer with RW  >/= 45/56    Time 12    Period Weeks    Status Revised    Target Date 03/14/21      PT LONG TERM GOAL #4   Title Patient ambulates 250' with RW & prosthesis modified independent.    Time 12    Period Weeks    Status Revised    Target Date 03/14/21      PT LONG TERM GOAL #5   Title Patient negotiates ramps & curbs with RW & prosthesis with supervision    Time 12    Period Weeks    Status Revised    Target Date 03/14/21                Plan - 12/18/20 1518    Clinical Impression Statement Patient is progressing slowly towards functioning with prosthesis at basic community level.  He appears to have orthostatic hypotension that results in fall risk with position changes.  He continues to have weakness & deconditioning that is slowly improving with PT but is complicated with chronic medical issues includling dialysis.  Patient would benefit from additional PT to further improve function & safety.    Personal Factors and Comorbidities Comorbidity 3+;Fitness;Time since onset of injury/illness/exacerbation    Comorbidities PAD, DM2, arthritis, gout, CHF, implanted cardioverter-defib, HTN, CKD w/ ESRD, blind left knee    Examination-Activity Limitations Locomotion Level;Stairs;Stand;Transfers    Examination-Participation Restrictions Community Activity  Stability/Clinical Decision Making Evolving/Moderate complexity    Rehab Potential Good    PT Frequency 2x / week    PT Duration 12 weeks    PT Treatment/Interventions  ADLs/Self Care Home Management;DME Instruction;Gait training;Stair training;Functional mobility training;Therapeutic activities;Therapeutic exercise;Balance training;Neuromuscular re-education;Patient/family education;Prosthetic Training;Manual techniques;Vestibular    PT Next Visit Plan review prosthetic care,  prosthetic gait with RW including ramps & curbs,  exercise for strength, balance, flexibility & endurance.    Consulted and Agree with Plan of Care Patient;Family member/caregiver    Family Member Consulted daughter,Sherry Cira Servant           Patient will benefit from skilled therapeutic intervention in order to improve the following deficits and impairments:  Abnormal gait,Cardiopulmonary status limiting activity,Decreased activity tolerance,Decreased balance,Decreased endurance,Decreased knowledge of use of DME,Decreased mobility,Decreased range of motion,Decreased scar mobility,Decreased strength,Increased edema,Postural dysfunction,Prosthetic Dependency  Visit Diagnosis: Other abnormalities of gait and mobility  Muscle weakness (generalized)  Stiffness of right knee, not elsewhere classified  Abnormal posture  Unsteadiness on feet     Problem List Patient Active Problem List   Diagnosis Date Noted  . Polycythemia, secondary 02/21/2020  . Stable treated proliferative diabetic retinopathy of right eye determined by examination associated with type 2 diabetes mellitus ([place]) 11/29/2019  . Stable treated proliferative diabetic retinopathy of left eye without macular edema determined by examination associated with type 2 diabetes mellitus ([place]) 11/29/2019  . Advanced nonexudative age-related macular degeneration of both eyes with subfoveal involvement 11/29/2019  . 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  . Sepsis ([place]) 04/14/2019  . Hypocalcemia 04/08/2019  . Drug induced constipation   . Anemia of chronic disease   . ESRD on dialysis ([place])   . Postoperative  pain   . Phantom limb pain ([place])   . S/P BKA (below knee amputation), left ([place]) 03/18/2019  . Unilateral complete BKA, left, initial encounter ([place]) 03/17/2019  . Critical lower limb ischemia ([place]) 03/12/2019  . Infection of amputation stump, right lower extremity ([place]) 03/10/2019  . Hypotension 02/23/2019  . Cellulitis of left foot 02/23/2019  . Gangrene of toe of left foot ([place]) 02/22/2019  . Complication of vascular dialysis catheter 11/24/2018  . Chronic osteomyelitis involving right ankle and foot ([place])   . Ulcer of right foot with fat layer exposed ([place]) 09/02/2018  . Encounter for planned post-operative wound closure   . Acute osteomyelitis of metatarsal bone of right foot ([place])   . Diabetic ulcer of midfoot associated with diabetes mellitus due to underlying condition, with necrosis of bone ([place])   . Osteomyelitis of right foot ([place])   . Vascular calcification   . Cellulitis and abscess of foot, except toes   . Anemia associated with chronic renal failure 07/13/2018  . Cardiomyopathy, dilated, nonischemic ([place]) 07/13/2018  . Diabetic foot infection ([place]) 07/13/2018  . PAD (peripheral artery disease) ([place])   . Macular degeneration   . Ventricular tachycardia ([place]) 06/24/2017  . Embolism due to vascular prosthetic devices, implants and grafts, initial encounter ([place]) 02/04/2016  . Dependence on renal dialysis ([place]) 05/30/2015  . Shortness of breath 02/05/2015  . Chronic systolic heart failure ([place]) 08/22/2014  . Diabetic infection of right foot ([place]) 07/13/2014  . Type 2 diabetes mellitus with diabetic peripheral angiopathy without gangrene ([place]) 03/18/2014  . Anxiety disorder, unspecified 02/23/2014  . Fever, unspecified 02/23/2014  . Headache 02/23/2014  . Pain, unspecified 02/23/2014  . Pruritus, unspecified 02/23/2014  . Mild protein-calorie malnutrition ([place]) 12/26/2013  . ESRD (end stage renal disease) on  dialysis ([place]) 11/11/2013  . Snoring 11/11/2013  .  Unspecified sleep apnea 11/11/2013  . Other pancytopenia ([place]) 09/22/2013  . Hemoptysis 09/20/2013  . Personal history of nicotine dependence 07/12/2013  . Coagulation defect, unspecified ([place]) 05/16/2013  . Iron deficiency anemia, unspecified 05/06/2013  . Other disorders of plasma-protein metabolism, not elsewhere classified 04/15/2013  . 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 chronic kidney disease or end stage renal disease ([place]) 03/18/2013  . Other cardiomyopathies ([place]) 03/18/2013  . Secondary hyperparathyroidism of renal origin ([place]) 03/18/2013  . Type 2 diabetes mellitus without complications ([place]) 47/58/3074  . Pre-transplant evaluation for kidney transplant 01/11/2013  . Automatic implantable cardioverter-defibrillator in situ 10/01/2010  . HYPERCHOLESTEROLEMIA, MIXED 05/03/2010  . Essential hypertension 05/03/2010  . CHF 05/03/2010    Jamey Reas, PT, DPT 12/18/2020, 4:56 PM  [facility] Physical Therapy [address] [place], [place], [address] Phone: [phone]   Fax:  [phone]  Name: Timothy Faulkner MRN: [id number] Date of Birth: 12/11/1945

## 2020-12-19 DIAGNOSIS — D689 Coagulation defect, unspecified: Secondary | ICD-10-CM | POA: Diagnosis not present

## 2020-12-19 DIAGNOSIS — Z992 Dependence on renal dialysis: Secondary | ICD-10-CM | POA: Diagnosis not present

## 2020-12-19 DIAGNOSIS — N2581 Secondary hyperparathyroidism of renal origin: Secondary | ICD-10-CM | POA: Diagnosis not present

## 2020-12-19 DIAGNOSIS — N186 End stage renal disease: Secondary | ICD-10-CM | POA: Diagnosis not present

## 2020-12-20 ENCOUNTER — Encounter: Payer: Medicare Other | Admitting: Physical Therapy

## 2020-12-21 DIAGNOSIS — Z992 Dependence on renal dialysis: Secondary | ICD-10-CM | POA: Diagnosis not present

## 2020-12-21 DIAGNOSIS — N2581 Secondary hyperparathyroidism of renal origin: Secondary | ICD-10-CM | POA: Diagnosis not present

## 2020-12-21 DIAGNOSIS — N186 End stage renal disease: Secondary | ICD-10-CM | POA: Diagnosis not present

## 2020-12-21 DIAGNOSIS — D689 Coagulation defect, unspecified: Secondary | ICD-10-CM | POA: Diagnosis not present

## 2020-12-24 DIAGNOSIS — Z992 Dependence on renal dialysis: Secondary | ICD-10-CM | POA: Diagnosis not present

## 2020-12-24 DIAGNOSIS — N2581 Secondary hyperparathyroidism of renal origin: Secondary | ICD-10-CM | POA: Diagnosis not present

## 2020-12-24 DIAGNOSIS — N186 End stage renal disease: Secondary | ICD-10-CM | POA: Diagnosis not present

## 2020-12-24 DIAGNOSIS — D689 Coagulation defect, unspecified: Secondary | ICD-10-CM | POA: Diagnosis not present

## 2020-12-24 DIAGNOSIS — E119 Type 2 diabetes mellitus without complications: Secondary | ICD-10-CM | POA: Diagnosis not present

## 2020-12-25 ENCOUNTER — Other Ambulatory Visit: Payer: Self-pay

## 2020-12-25 ENCOUNTER — Ambulatory Visit (INDEPENDENT_AMBULATORY_CARE_PROVIDER_SITE_OTHER): Payer: Medicare Other | Admitting: Otolaryngology

## 2020-12-25 ENCOUNTER — Ambulatory Visit (INDEPENDENT_AMBULATORY_CARE_PROVIDER_SITE_OTHER): Payer: Medicare Other | Admitting: Physical Therapy

## 2020-12-25 ENCOUNTER — Encounter: Payer: Medicare Other | Admitting: Physical Therapy

## 2020-12-25 DIAGNOSIS — R2689 Other abnormalities of gait and mobility: Secondary | ICD-10-CM | POA: Diagnosis not present

## 2020-12-25 DIAGNOSIS — M25661 Stiffness of right knee, not elsewhere classified: Secondary | ICD-10-CM | POA: Diagnosis not present

## 2020-12-25 DIAGNOSIS — R2681 Unsteadiness on feet: Secondary | ICD-10-CM | POA: Diagnosis not present

## 2020-12-25 DIAGNOSIS — M6281 Muscle weakness (generalized): Secondary | ICD-10-CM | POA: Diagnosis not present

## 2020-12-25 DIAGNOSIS — R293 Abnormal posture: Secondary | ICD-10-CM

## 2020-12-25 NOTE — Therapy (Signed)
[facility] Physical Therapy [address] [place], [place], [address] Phone: [phone]   Fax:  [phone]  Physical Therapy Treatment  Patient Details  Name: Timothy Faulkner MRN: [id number] Date of Birth: [birth year] Referring Provider (PT): Monica Martinez, MD   Encounter Date: 12/25/2020   PT End of Session - 12/25/20 1542    Visit Number 16    Number of Visits 39    Date for PT Re-Evaluation 12/24/20    Authorization Type UHC Medicare    Progress Note Due on Visit 20    PT Start Time 1516    PT Stop Time 1600    PT Time Calculation (min) 44 min    Equipment Utilized During Treatment Gait belt    Activity Tolerance Patient tolerated treatment well;Patient limited by fatigue    Behavior During Therapy [facility] for tasks assessed/performed           Past Medical History:  Diagnosis Date  . Anemia   . Arthritis    Gout  . Automatic implantable cardioverter-defibrillator in situ    [employer]  . CHF   . ESRD on dialysis [facility])    [name] 03/11/2013 (03/11/2013) dialysis M/W/F  . Gangrene ([place])    left foot  . GERD (gastroesophageal reflux disease)   . Gout    "once a year"  . Heart murmur   . HYPERCHOLESTEROLEMIA, MIXED   . HYPERTENSION   . Macular degeneration   . Osteomyelitis ([place])    right foot  . 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 Pacemaker   . Peripheral arterial disease (HCC)    left fifth toe ulcer, healing  . Pneumonia   . Shortness of breath   . 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 Wears dentures   . Wears glasses     Past Surgical History:  Procedure Laterality Date  . A/V FISTULAGRAM Left 07/20/2018   Procedure: A/V FISTULAGRAM;  Surgeon: [name], MD;  Location: [place] CV LAB;  Service: Cardiovascular;  Laterality: Left;  . A/V FISTULAGRAM N/A 07/15/2019   Procedure: A/V FISTULAGRAM - Left Arm;  Surgeon: Elam Dutch, MD;  Location: [place] CV  LAB;  Service: Cardiovascular;  Laterality: N/A;  . ABDOMINAL AORTOGRAM N/A 02/25/2019   Procedure: ABDOMINAL AORTOGRAM;  Surgeon: Elam Dutch, MD;  Location: [place] [facility] LAB;  Service: Cardiovascular;  Laterality: N/A;  . ABDOMINAL AORTOGRAM W/LOWER EXTREMITY Bilateral 07/20/2018   Procedure: ABDOMINAL AORTOGRAM W/LOWER EXTREMITY;  Surgeon: [name], MD;  Location: [place] CV LAB;  Service: Cardiovascular;  Laterality: Bilateral;  . AMPUTATION Right 09/07/2018   Procedure: Right fifth metatarsectomy;  Surgeon: [name], DPM;  Location: [place];  Service: Podiatry;  Laterality: Right;  . AMPUTATION Left 03/08/2019   Procedure: AMPUTATION  3RD AND 4TH TOES LEFT FOOT;  Surgeon: [name], DPM;  Location: [place];  Service: Podiatry;  Laterality: Left;  . AMPUTATION Left 03/15/2019   Procedure: AMPUTATION BELOW KNEE;  Surgeon: Elam Dutch, MD;  Location: [facility] OR;  Service: Vascular;  Laterality: Left;  . AV FISTULA PLACEMENT Right 12/13/2012   Procedure: ARTERIOVENOUS (AV) FISTULA CREATION;  Surgeon: [name], MD;  Location: [place];  Service: Vascular;  Laterality: Right;  Ultrasound guided  . AV FISTULA PLACEMENT Left 05/07/2016   Procedure: LEFT RADIOCEPHALIC ARTERIOVENOUS (AV) FISTULA CREATION;  Surgeon: [name], MD;  Location: [place];  Service:  Vascular;  Laterality: Left;  . AV FISTULA PLACEMENT Right 08/09/2019   Procedure: INSERTION OF RIGHT ARTERIOVENOUS (AV) 4-70m GORE-TEX STRETCH GRAFT RIGHT  ARM;  Surgeon: FElam Dutch MD;  [place]  Service: Vascular;  Laterality: Right;  . BASCILIC VEIN TRANSPOSITION Right 03/26/2016   Procedure: RIGHT BASILIC VEIN TRANSPOSITION;  Surgeon: TRosetta Posner MD;  [place]  Service: Vascular;  Laterality: Right;  . CARDIAC CATHETERIZATION    . CARDIAC DEFIBRILLATOR PLACEMENT     BChemical engineer . CATARACT EXTRACTION W/PHACO Bilateral   . EYE SURGERY Bilateral    Cataract  . FISTULOGRAM Left  04/22/2018   Procedure: FISTULOGRAM UPPER EXTREMITY;  Surgeon: CMarty Heck MD;  [place]  Service: Vascular;  Laterality: Left;  . I & D EXTREMITY Right 07/15/2018   Procedure: IRRIGATION AND DEBRIDEMENT RIGHT FOOT;  Surgeon: PEvelina Bucy DPM;  [place]  Service: Podiatry;  Laterality: Right;  . INCISION AND DRAINAGE ABSCESS / HEMATOMA OF BURSA / KNEE / THIGH Left 2012   "knee" (03/11/2013)  . INSERT / REPLACE / REMOVE PACEMAKER    . INSERTION OF DIALYSIS CATHETER Left 04/22/2018   Procedure: INSERTION OF DIALYSIS CATHETER;  Surgeon: CMarty Heck MD;  [place]  Service: Vascular;  Laterality: Left;  . IR FLUORO GUIDE CV LINE LEFT  07/15/2018  . IR PTA VENOUS EXCEPT DIALYSIS CIRCUIT  07/15/2018  . LOWER EXTREMITY ANGIOGRAPHY Right 07/21/2018   Procedure: LOWER EXTREMITY ANGIOGRAPHY;  Surgeon: CMarty Heck MD;  [place];  Service: Cardiovascular;  Laterality: Right;  . LOWER EXTREMITY ANGIOGRAPHY Bilateral 02/25/2019   Procedure: Lower Extremity Angiography;  Surgeon: FElam Dutch MD;  [place];  Service: Cardiovascular;  Laterality: Bilateral;  . METATARSAL HEAD EXCISION Right 07/15/2018   Procedure: METATARSAL RESECTION;  Surgeon: PEvelina Bucy DPM;  [place]  Service: Podiatry;  Laterality: Right;  . MULTIPLE TOOTH EXTRACTIONS    . PERIPHERAL VASCULAR ATHERECTOMY Right 02/28/2019   Procedure: PERIPHERAL VASCULAR ATHERECTOMY;  Surgeon: CWaynetta Sandy MD;  [place];  Service: Cardiovascular;  Laterality: Right;  right tp trunk   . PERIPHERAL VASCULAR BALLOON ANGIOPLASTY Left 02/25/2019   Procedure: PERIPHERAL VASCULAR BALLOON ANGIOPLASTY;  Surgeon: FElam Dutch MD;  [place];  Service: Cardiovascular;  Laterality: Left;  tibial peroneal trunk and PT  . PERIPHERAL VASCULAR INTERVENTION Right 07/21/2018   Procedure: PERIPHERAL VASCULAR  INTERVENTION;  Surgeon: CMarty Heck MD;  [place];  Service: Cardiovascular;  Laterality: Right;  peroneal stents  . REVISON OF ARTERIOVENOUS FISTULA Right 35/18/8416  Procedure: Plication OF Right Arm ARTERIOVENOUS FISTULA;  Surgeon: CElam Dutch MD;  [facility]  Service: Vascular;  Laterality: Right;  . REVISON OF ARTERIOVENOUS FISTULA Left 04/22/2018   Procedure: REVISION OF RADIOCEPHALIC ARTERIOVENOUS FISTULA;  Surgeon: CMarty Heck MD;  [place]  Service: Vascular;  Laterality: Left;  . SHUNTOGRAM N/A 05/31/2013   Procedure: SEarney Mallet  Surgeon: VSerafina Mitchell MD;  [facility];  Service: Cardiovascular;  Laterality: N/A;  . UPPER EXTREMITY VENOGRAPHY Right 07/23/2018   Procedure: UPPER EXTREMITY VENOGRAPHY;  Surgeon: DAngelia Mould MD;  [place];  Service: Cardiovascular;  Laterality: Right;  . UPPER EXTREMITY VENOGRAPHY  07/15/2019   Procedure: UPPER EXTREMITY VENOGRAPHY;  Surgeon: FElam Dutch MD;  [place];  Service: Cardiovascular;;  rt arm   . WOUND DEBRIDEMENT Right 07/17/2018   Procedure: Wound Debridement; Closure Filleted toe flap Right Foot;  Surgeon: [name], DPM;  Location: [place];  Service: Podiatry;  Laterality: Right;  . WOUND DEBRIDEMENT Right 09/07/2018   Procedure: Debridement Right Foot Wound, application of wound vac;  Surgeon: [name], DPM;  Location: [place];  Service: Podiatry;  Laterality: Right;  . WOUND DEBRIDEMENT Right 03/08/2019   Procedure: DEBRIDEMENT WOUND RIGHT FOOT;  Surgeon: [name], DPM;  Location: [place];  Service: Podiatry;  Laterality: Right;    There were no vitals filed for this visit.   Subjective Assessment - 12/25/20 1542    Subjective He relays he had some trouble getting on his prosthesis today, so wanted to review it step by step. He denies pain today    Pertinent History PAD, DM2, arthritis, gout,  CHF, implanted cardioverter-defib, HTN, CKD w/ ESRD    Patient Stated Goals to use prosthesis to walk in community,           [facility] Adult PT Treatment/Exercise - 12/25/20 0001      Transfers   Transfers Sit to Stand;Stand to Sit    Sit to Stand 5: Supervision;With upper extremity assist;With armrests;From chair/3-in-1;Other (comment)    Sit to Stand Details --   to RW   Stand to Sit 5: Supervision;With upper extremity assist;With armrests;To chair/3-in-1;Other (comment)      Ambulation/Gait   Ambulation/Gait Yes    Ambulation/Gait Assistance 5: Supervision    Ambulation Distance (Feet) 160 Feet   then 75X2   Assistive device Rolling walker;Prosthesis    Gait Pattern Step-to pattern;Decreased step length - right;Decreased stance time - left;Decreased stride length;Decreased hip/knee flexion - left;Decreased weight shift to left;Left hip hike;Trunk flexed;Abducted - left;Poor foot clearance - right    Ambulation Surface Level;Indoor    Ramp 4: Min assist    Ramp Details (indicate cue type and reason) demo, verbal & tactile cues on technique    Curb 4: Min assist    Curb Details (indicate cue type and reason) 6 inch with RW      Knee/Hip Exercises: Aerobic   Nustep seat 12 Level 5 for 8 min      Knee/Hip Exercises: Standing   Other Standing Knee Exercises balance with feet narrow and head turns and nods X10 ea      Knee/Hip Exercises: Seated   Sit to Sand 2 sets;5 reps;without UE support   from barstool in bars, needs min A at times for balance     Prosthetics   Prosthetic Care Comments  reviewed propper donning/doffing procedure and he was able to don his leg easily independently after cuing                    PT Short Term Goals - 12/18/20 1653      PT SHORT TERM GOAL #1   Title Patient verbalizes proper adjustment of ply socks.    Time 4    Period Weeks    Status On-going    Target Date 01/17/21      PT SHORT TERM GOAL #2   Title Patient verbalize prosthesis  wear within 39min of arising in mornings.    Time 4    Period Weeks    Status Revised    Target Date 01/17/21      PT SHORT TERM GOAL #3   Title Patient standing balance with RW & prosthesis reaching  10" anteriorly & within 5" of floor with supervision.    Time 4    Period Weeks    Status Revised    Target Date 01/17/21      PT SHORT TERM GOAL #4   Title Patient ambulates 5' with RW & prosthesis with supervision.    Time 4    Period Weeks    Status Revised    Target Date 01/17/21      PT SHORT TERM GOAL #5   Title Patient negotiates ramps & curbs with RW & prostheis with modA    Time 4    Period Weeks    Status On-going    Target Date 01/17/21             PT Long Term Goals - 12/18/20 1658      PT LONG TERM GOAL #1   Title Patient & family verbalize & demonstrate understanding of proper prosthetic care to enable utilization of prosthesis.    Time 12    Period Weeks    Status On-going    Target Date 03/14/21      PT LONG TERM GOAL #2   Title Patient tolerates wear of prosthesis >90% of awake hours without skin or limb discomfort issues.    Time 12    Period Weeks    Status On-going    Target Date 03/14/21      PT LONG TERM GOAL #3   Title Tasks of Oceanographer with RW  >/= 45/56    Time 12    Period Weeks    Status Revised    Target Date 03/14/21      PT LONG TERM GOAL #4   Title Patient ambulates 250' with RW & prosthesis modified independent.    Time 12    Period Weeks    Status Revised    Target Date 03/14/21      PT LONG TERM GOAL #5   Title Patient negotiates ramps & curbs with RW & prosthesis with supervision    Time 12    Period Weeks    Status Revised    Target Date 03/14/21                 Plan - 12/25/20 1608    Clinical Impression Statement Worked on overall ambulation and standing endurance today as tolerated. He does still require min A with ramps but can now do curbs more CGA only. He will continue to benefit from PT to  improve his overall function.    Personal Factors and Comorbidities Comorbidity 3+;Fitness;Time since onset of injury/illness/exacerbation    Comorbidities PAD, DM2, arthritis, gout, CHF, implanted cardioverter-defib, HTN, CKD w/ ESRD, blind left knee    Examination-Activity Limitations Locomotion Level;Stairs;Stand;Transfers    Examination-Participation Restrictions Community Activity    Stability/Clinical Decision Making Evolving/Moderate complexity    Rehab Potential Good    PT Frequency 2x / week    PT Duration 12 weeks    PT Treatment/Interventions ADLs/Self Care Home Management;DME Instruction;Gait training;Stair training;Functional mobility training;Therapeutic activities;Therapeutic exercise;Balance training;Neuromuscular re-education;Patient/family education;Prosthetic Training;Manual techniques;Vestibular    PT Next Visit Plan review prosthetic care,  prosthetic gait with RW including ramps & curbs,  exercise for strength, balance, flexibility & endurance.    Consulted and Agree with Plan of Care Patient;Family member/caregiver    Family Member Consulted daughter,Sherry Cira Servant           Patient will benefit from skilled therapeutic intervention in order to improve the following deficits and impairments:  Abnormal gait,Cardiopulmonary status limiting activity,Decreased activity tolerance,Decreased balance,Decreased endurance,Decreased knowledge of use of DME,Decreased mobility,Decreased range of motion,Decreased scar mobility,Decreased strength,Increased edema,Postural dysfunction,Prosthetic Dependency  Visit Diagnosis: Other abnormalities of gait and mobility  Muscle weakness (generalized)  Stiffness of right knee, not elsewhere classified  Abnormal posture  Unsteadiness on feet     Problem List Patient Active Problem List   Diagnosis Date Noted  . Polycythemia, secondary 02/21/2020  . Stable treated proliferative diabetic retinopathy of right eye determined by  examination associated with type 2 diabetes mellitus ([place]) 11/29/2019  . Stable treated proliferative diabetic retinopathy of left eye without macular edema determined by examination associated with type 2 diabetes mellitus ([place]) 11/29/2019  . Advanced nonexudative age-related macular degeneration of both eyes with subfoveal involvement 11/29/2019  . 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  . Sepsis ([place]) 04/14/2019  . Hypocalcemia 04/08/2019  . Drug induced constipation   . Anemia of chronic disease   . ESRD on dialysis ([place])   . Postoperative pain   . Phantom limb pain ([place])   . S/P BKA (below knee amputation), left ([place]) 03/18/2019  . Unilateral complete BKA, left, initial encounter ([place]) 03/17/2019  . Critical lower limb ischemia ([place]) 03/12/2019  . Infection of amputation stump, right lower extremity ([place]) 03/10/2019  . Hypotension 02/23/2019  . Cellulitis of left foot 02/23/2019  . Gangrene of toe of left foot ([place]) 02/22/2019  . Complication of vascular dialysis catheter 11/24/2018  . Chronic osteomyelitis involving right ankle and foot ([place])   . Ulcer of right foot with fat layer exposed ([place]) 09/02/2018  . Encounter for planned post-operative wound closure   . Acute osteomyelitis of metatarsal bone of right foot ([place])   . Diabetic ulcer of midfoot associated with diabetes mellitus due to underlying condition, with necrosis of bone ([place])   . Osteomyelitis of right foot ([place])   . Vascular calcification   . Cellulitis and abscess of foot, except toes   . Anemia associated with chronic renal failure 07/13/2018  . Cardiomyopathy, dilated, nonischemic ([place]) 07/13/2018  . Diabetic foot infection ([place]) 07/13/2018  . PAD (peripheral artery disease) ([place])   . Macular degeneration   . Ventricular tachycardia ([place]) 06/24/2017  . Embolism due to vascular prosthetic devices, implants and grafts, initial encounter ([place]) 02/04/2016  . Dependence on renal dialysis ([place])  05/30/2015  . Shortness of breath 02/05/2015  . Chronic systolic heart failure ([place]) 08/22/2014  . Diabetic infection of right foot ([place]) 07/13/2014  . Type 2 diabetes mellitus with diabetic peripheral angiopathy without gangrene ([place]) 03/18/2014  . Anxiety disorder, unspecified 02/23/2014  . Fever, unspecified 02/23/2014  . Headache 02/23/2014  . Pain, unspecified 02/23/2014  . Pruritus, unspecified 02/23/2014  . Mild protein-calorie malnutrition ([place]) 12/26/2013  . ESRD (end stage renal disease) on dialysis ([place]) 11/11/2013  . Snoring 11/11/2013  . Unspecified sleep apnea 11/11/2013  . Other pancytopenia ([place]) 09/22/2013  . Hemoptysis 09/20/2013  . Personal history of nicotine dependence 07/12/2013  . Coagulation defect, unspecified ([place]) 05/16/2013  . Iron deficiency anemia, unspecified 05/06/2013  . Other disorders of plasma-protein metabolism, not elsewhere classified 04/15/2013  . 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 Other cardiomyopathies ([place]) 03/18/2013  . Secondary hyperparathyroidism of renal origin ([place]) 03/18/2013  . Type 2 diabetes mellitus without complications ([place]) 32/44/0102  . Pre-transplant evaluation for kidney transplant 01/11/2013  . Automatic implantable cardioverter-defibrillator in situ 10/01/2010  . HYPERCHOLESTEROLEMIA, MIXED 05/03/2010  .  Essential hypertension 05/03/2010  . CHF 05/03/2010    Silvestre Mesi 12/25/2020, 4:10 PM  [facility] Physical Therapy [address] [place], [place], [address] Phone: [phone]   Fax:  [phone]  Name: Timothy Faulkner MRN: [id number] Date of Birth: Dec 31, 1945

## 2020-12-26 DIAGNOSIS — Z992 Dependence on renal dialysis: Secondary | ICD-10-CM | POA: Diagnosis not present

## 2020-12-26 DIAGNOSIS — N186 End stage renal disease: Secondary | ICD-10-CM | POA: Diagnosis not present

## 2020-12-26 DIAGNOSIS — D689 Coagulation defect, unspecified: Secondary | ICD-10-CM | POA: Diagnosis not present

## 2020-12-26 DIAGNOSIS — E119 Type 2 diabetes mellitus without complications: Secondary | ICD-10-CM | POA: Diagnosis not present

## 2020-12-26 DIAGNOSIS — N2581 Secondary hyperparathyroidism of renal origin: Secondary | ICD-10-CM | POA: Diagnosis not present

## 2020-12-27 ENCOUNTER — Encounter: Payer: Medicare Other | Admitting: Physical Therapy

## 2020-12-27 ENCOUNTER — Other Ambulatory Visit: Payer: Self-pay

## 2020-12-27 ENCOUNTER — Ambulatory Visit (INDEPENDENT_AMBULATORY_CARE_PROVIDER_SITE_OTHER): Payer: Medicare Other | Admitting: Physical Therapy

## 2020-12-27 DIAGNOSIS — R2681 Unsteadiness on feet: Secondary | ICD-10-CM

## 2020-12-27 DIAGNOSIS — R293 Abnormal posture: Secondary | ICD-10-CM | POA: Diagnosis not present

## 2020-12-27 DIAGNOSIS — M6281 Muscle weakness (generalized): Secondary | ICD-10-CM

## 2020-12-27 DIAGNOSIS — M25661 Stiffness of right knee, not elsewhere classified: Secondary | ICD-10-CM

## 2020-12-27 DIAGNOSIS — R2689 Other abnormalities of gait and mobility: Secondary | ICD-10-CM

## 2020-12-27 NOTE — Therapy (Signed)
[facility] [address] [place], [place], [address] Phone: [phone]   Fax:  [phone]  Physical Therapy Treatment  Patient Details  Name: Timothy Faulkner MRN: [id number] Date of Birth: [birth year] Referring Provider (PT): Monica Martinez, MD   Encounter Date: 12/27/2020   PT End of Session - 12/27/20 1525    Visit Number 17    Number of Visits 39    Date for PT Re-Evaluation 12/24/20    Authorization Type UHC Medicare    Progress Note Due on Visit 20    PT Start Time 1515    PT Stop Time 1559    PT Time Calculation (min) 44 min    Equipment Utilized During Treatment Gait belt    Activity Tolerance Patient tolerated treatment well;Patient limited by fatigue    Behavior During Therapy [facility] for tasks assessed/performed           Past Medical History:  Diagnosis Date  . Anemia   . Arthritis    Gout  . Automatic implantable cardioverter-defibrillator in situ    [employer]  . CHF   . ESRD on dialysis [facility])    [name] 03/11/2013 (03/11/2013) dialysis M/W/F  . Gangrene ([place])    left foot  . GERD (gastroesophageal reflux disease)   . Gout    "once a year"  . Heart murmur   . HYPERCHOLESTEROLEMIA, MIXED   . HYPERTENSION   . Macular degeneration   . Osteomyelitis ([place])    right foot  . 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 Pacemaker   . Peripheral arterial disease (HCC)    left fifth toe ulcer, healing  . Pneumonia   . Shortness of breath   . 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 Wears dentures   . Wears glasses     Past Surgical History:  Procedure Laterality Date  . A/V FISTULAGRAM Left 07/20/2018   Procedure: A/V FISTULAGRAM;  Surgeon: [name], MD;  Location: [place] CV LAB;  Service: Cardiovascular;  Laterality: Left;  . A/V FISTULAGRAM N/A 07/15/2019   Procedure: A/V FISTULAGRAM - Left Arm;  Surgeon: Elam Dutch, MD;  Location: [place] CV  LAB;  Service: Cardiovascular;  Laterality: N/A;  . ABDOMINAL AORTOGRAM N/A 02/25/2019   Procedure: ABDOMINAL AORTOGRAM;  Surgeon: Elam Dutch, MD;  Location: [place] CV LAB;  Service: Cardiovascular;  Laterality: N/A;  . ABDOMINAL AORTOGRAM W/LOWER EXTREMITY Bilateral 07/20/2018   Procedure: ABDOMINAL AORTOGRAM W/LOWER EXTREMITY;  Surgeon: [name], MD;  Location: [place] CV LAB;  Service: Cardiovascular;  Laterality: Bilateral;  . AMPUTATION Right 09/07/2018   Procedure: Right fifth metatarsectomy;  Surgeon: [name], DPM;  Location: [place];  Service: Podiatry;  Laterality: Right;  . AMPUTATION Left 03/08/2019   Procedure: AMPUTATION  3RD AND 4TH TOES LEFT FOOT;  Surgeon: [name], DPM;  Location: [place];  Service: Podiatry;  Laterality: Left;  . AMPUTATION Left 03/15/2019   Procedure: AMPUTATION BELOW KNEE;  Surgeon: Elam Dutch, MD;  Location: [facility] OR;  Service: Vascular;  Laterality: Left;  . AV FISTULA PLACEMENT Right 12/13/2012   Procedure: ARTERIOVENOUS (AV) FISTULA CREATION;  Surgeon: [name], MD;  Location: [place];  Service: Vascular;  Laterality: Right;  Ultrasound guided  . AV FISTULA PLACEMENT Left 05/07/2016   Procedure: LEFT RADIOCEPHALIC ARTERIOVENOUS (AV) FISTULA CREATION;  Surgeon: [name], MD;  Location: [place];  Service:  Vascular;  Laterality: Left;  . AV FISTULA PLACEMENT Right 08/09/2019   Procedure: INSERTION OF RIGHT ARTERIOVENOUS (AV) 4-70m GORE-TEX STRETCH GRAFT RIGHT  ARM;  Surgeon: FElam Dutch MD;  [place]  Service: Vascular;  Laterality: Right;  . BASCILIC VEIN TRANSPOSITION Right 03/26/2016   Procedure: RIGHT BASILIC VEIN TRANSPOSITION;  Surgeon: TRosetta Posner MD;  [place]  Service: Vascular;  Laterality: Right;  . CARDIAC CATHETERIZATION    . CARDIAC DEFIBRILLATOR PLACEMENT     BChemical engineer . CATARACT EXTRACTION W/PHACO Bilateral   . EYE SURGERY Bilateral    Cataract  . FISTULOGRAM Left  04/22/2018   Procedure: FISTULOGRAM UPPER EXTREMITY;  Surgeon: CMarty Heck MD;  [place]  Service: Vascular;  Laterality: Left;  . I & D EXTREMITY Right 07/15/2018   Procedure: IRRIGATION AND DEBRIDEMENT RIGHT FOOT;  Surgeon: PEvelina Bucy DPM;  [place]  Service: Podiatry;  Laterality: Right;  . INCISION AND DRAINAGE ABSCESS / HEMATOMA OF BURSA / KNEE / THIGH Left 2012   "knee" (03/11/2013)  . INSERT / REPLACE / REMOVE PACEMAKER    . INSERTION OF DIALYSIS CATHETER Left 04/22/2018   Procedure: INSERTION OF DIALYSIS CATHETER;  Surgeon: CMarty Heck MD;  [place]  Service: Vascular;  Laterality: Left;  . IR FLUORO GUIDE CV LINE LEFT  07/15/2018  . IR PTA VENOUS EXCEPT DIALYSIS CIRCUIT  07/15/2018  . LOWER EXTREMITY ANGIOGRAPHY Right 07/21/2018   Procedure: LOWER EXTREMITY ANGIOGRAPHY;  Surgeon: CMarty Heck MD;  [place];  Service: Cardiovascular;  Laterality: Right;  . LOWER EXTREMITY ANGIOGRAPHY Bilateral 02/25/2019   Procedure: Lower Extremity Angiography;  Surgeon: FElam Dutch MD;  [place];  Service: Cardiovascular;  Laterality: Bilateral;  . METATARSAL HEAD EXCISION Right 07/15/2018   Procedure: METATARSAL RESECTION;  Surgeon: PEvelina Bucy DPM;  [place]  Service: Podiatry;  Laterality: Right;  . MULTIPLE TOOTH EXTRACTIONS    . PERIPHERAL VASCULAR ATHERECTOMY Right 02/28/2019   Procedure: PERIPHERAL VASCULAR ATHERECTOMY;  Surgeon: CWaynetta Sandy MD;  [place];  Service: Cardiovascular;  Laterality: Right;  right tp trunk   . PERIPHERAL VASCULAR BALLOON ANGIOPLASTY Left 02/25/2019   Procedure: PERIPHERAL VASCULAR BALLOON ANGIOPLASTY;  Surgeon: FElam Dutch MD;  [place];  Service: Cardiovascular;  Laterality: Left;  tibial peroneal trunk and PT  . PERIPHERAL VASCULAR INTERVENTION Right 07/21/2018   Procedure: PERIPHERAL VASCULAR  INTERVENTION;  Surgeon: CMarty Heck MD;  [place];  Service: Cardiovascular;  Laterality: Right;  peroneal stents  . REVISON OF ARTERIOVENOUS FISTULA Right 35/18/8416  Procedure: Plication OF Right Arm ARTERIOVENOUS FISTULA;  Surgeon: CElam Dutch MD;  [facility]  Service: Vascular;  Laterality: Right;  . REVISON OF ARTERIOVENOUS FISTULA Left 04/22/2018   Procedure: REVISION OF RADIOCEPHALIC ARTERIOVENOUS FISTULA;  Surgeon: CMarty Heck MD;  [place]  Service: Vascular;  Laterality: Left;  . SHUNTOGRAM N/A 05/31/2013   Procedure: SEarney Mallet  Surgeon: VSerafina Mitchell MD;  [facility];  Service: Cardiovascular;  Laterality: N/A;  . UPPER EXTREMITY VENOGRAPHY Right 07/23/2018   Procedure: UPPER EXTREMITY VENOGRAPHY;  Surgeon: DAngelia Mould MD;  [place];  Service: Cardiovascular;  Laterality: Right;  . UPPER EXTREMITY VENOGRAPHY  07/15/2019   Procedure: UPPER EXTREMITY VENOGRAPHY;  Surgeon: FElam Dutch MD;  [place];  Service: Cardiovascular;;  rt arm   . WOUND DEBRIDEMENT Right 07/17/2018   Procedure: Wound Debridement; Closure Filleted toe flap Right Foot;  Surgeon: [name], DPM;  Location: [place];  Service: Podiatry;  Laterality: Right;  . WOUND DEBRIDEMENT Right 09/07/2018   Procedure: Debridement Right Foot Wound, application of wound vac;  Surgeon: [name], DPM;  Location: [place];  Service: Podiatry;  Laterality: Right;  . WOUND DEBRIDEMENT Right 03/08/2019   Procedure: DEBRIDEMENT WOUND RIGHT FOOT;  Surgeon: [name], DPM;  Location: [place];  Service: Podiatry;  Laterality: Right;    There were no vitals filed for this visit.   Subjective Assessment - 12/27/20 1524    Subjective He had no trouble donning prosthesis today and he denies pain.    Pertinent History PAD, DM2, arthritis, gout, CHF, implanted cardioverter-defib, HTN, CKD w/ ESRD     Patient Stated Goals to use prosthesis to walk in community,            [facility] Adult PT Treatment/Exercise - 12/27/20 0001      Transfers   Transfers Sit to Stand;Stand to Sit    Sit to Stand 5: Supervision;With upper extremity assist;With armrests;From chair/3-in-1;Other (comment)    Sit to Stand Details Verbal cues for sequencing;Verbal cues for technique;Visual cues/gestures for sequencing    Stand to Sit 5: Supervision;With upper extremity assist;With armrests;To chair/3-in-1;Other (comment)      Ambulation/Gait   Ambulation/Gait Yes    Ambulation/Gait Assistance 5: Supervision    Ambulation Distance (Feet) 160 Feet   then 75   Assistive device Rolling walker;Prosthesis    Gait Pattern Step-to pattern;Decreased step length - right;Decreased stance time - left;Decreased stride length;Decreased hip/knee flexion - left;Decreased weight shift to left;Left hip hike;Trunk flexed;Abducted - left;Poor foot clearance - right    Ambulation Surface Level;Indoor      Knee/Hip Exercises: Diplomatic Services operational officer Left;3 reps;30 seconds    Active Hamstring Stretch Limitations at leg press      Knee/Hip Exercises: Aerobic   Nustep --      Knee/Hip Exercises: Machines for Strengthening   Cybex Leg Press BLEs 100# 15 reps 2 sets      Knee/Hip Exercises: Standing   Other Standing Knee Exercises modified tandem balance 30 sec X2 bilat, unilat alternating    Other Standing Knee Exercises hip 4 way red X10 ea with bilat UE support      Knee/Hip Exercises: Seated   Sit to Sand 2 sets;5 reps;without UE support   24 inch bar stool                   PT Short Term Goals - 12/18/20 1653      PT SHORT TERM GOAL #1   Title Patient verbalizes proper adjustment of ply socks.    Time 4    Period Weeks    Status On-going    Target Date 01/17/21      PT SHORT TERM GOAL #2   Title Patient verbalize prosthesis wear within 74min of arising in mornings.    Time 4    Period Weeks     Status Revised    Target Date 01/17/21      PT SHORT TERM GOAL #3   Title Patient standing balance with RW & prosthesis reaching 10" anteriorly & within 5" of floor with supervision.    Time 4    Period Weeks    Status Revised    Target Date  01/17/21      PT SHORT TERM GOAL #4   Title Patient ambulates 79' with RW & prosthesis with supervision.    Time 4    Period Weeks    Status Revised    Target Date 01/17/21      PT SHORT TERM GOAL #5   Title Patient negotiates ramps & curbs with RW & prostheis with modA    Time 4    Period Weeks    Status On-going    Target Date 01/17/21             PT Long Term Goals - 12/18/20 1658      PT LONG TERM GOAL #1   Title Patient & family verbalize & demonstrate understanding of proper prosthetic care to enable utilization of prosthesis.    Time 12    Period Weeks    Status On-going    Target Date 03/14/21      PT LONG TERM GOAL #2   Title Patient tolerates wear of prosthesis >90% of awake hours without skin or limb discomfort issues.    Time 12    Period Weeks    Status On-going    Target Date 03/14/21      PT LONG TERM GOAL #3   Title Tasks of Oceanographer with RW  >/= 45/56    Time 12    Period Weeks    Status Revised    Target Date 03/14/21      PT LONG TERM GOAL #4   Title Patient ambulates 250' with RW & prosthesis modified independent.    Time 12    Period Weeks    Status Revised    Target Date 03/14/21      PT LONG TERM GOAL #5   Title Patient negotiates ramps & curbs with RW & prosthesis with supervision    Time 12    Period Weeks    Status Revised    Target Date 03/14/21                 Plan - 12/27/20 1525    Clinical Impression Statement Session worked on functional mobility, ambulation, strength, and balance today with good overall tolerance and we will continue to progres his as able.    Personal Factors and Comorbidities Comorbidity 3+;Fitness;Time since onset of  injury/illness/exacerbation    Comorbidities PAD, DM2, arthritis, gout, CHF, implanted cardioverter-defib, HTN, CKD w/ ESRD, blind left knee    Examination-Activity Limitations Locomotion Level;Stairs;Stand;Transfers    Examination-Participation Restrictions Community Activity    Stability/Clinical Decision Making Evolving/Moderate complexity    Rehab Potential Good    PT Frequency 2x / week    PT Duration 12 weeks    PT Treatment/Interventions ADLs/Self Care Home Management;DME Instruction;Gait training;Stair training;Functional mobility training;Therapeutic activities;Therapeutic exercise;Balance training;Neuromuscular re-education;Patient/family education;Prosthetic Training;Manual techniques;Vestibular    PT Next Visit Plan review prosthetic care,  prosthetic gait with RW including ramps & curbs,  exercise for strength, balance, flexibility & endurance.    Consulted and Agree with Plan of Care Patient;Family member/caregiver    Family Member Consulted daughter,Sherry Cira Servant           Patient will benefit from skilled therapeutic intervention in order to improve the following deficits and impairments:  Abnormal gait,Cardiopulmonary status limiting activity,Decreased activity tolerance,Decreased balance,Decreased endurance,Decreased knowledge of use of DME,Decreased mobility,Decreased range of motion,Decreased scar mobility,Decreased strength,Increased edema,Postural dysfunction,Prosthetic Dependency  Visit Diagnosis: Other abnormalities of gait and mobility  Muscle weakness (generalized)  Stiffness of right knee, not  elsewhere classified  Abnormal posture  Unsteadiness on feet     Problem List Patient Active Problem List   Diagnosis Date Noted  . Polycythemia, secondary 02/21/2020  . Stable treated proliferative diabetic retinopathy of right eye determined by examination associated with type 2 diabetes mellitus ([place]) 11/29/2019  . Stable treated proliferative diabetic  retinopathy of left eye without macular edema determined by examination associated with type 2 diabetes mellitus ([place]) 11/29/2019  . Advanced nonexudative age-related macular degeneration of both eyes with subfoveal involvement 11/29/2019  . 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  . Sepsis ([place]) 04/14/2019  . Hypocalcemia 04/08/2019  . Drug induced constipation   . Anemia of chronic disease   . ESRD on dialysis ([place])   . Postoperative pain   . Phantom limb pain ([place])   . S/P BKA (below knee amputation), left ([place]) 03/18/2019  . Unilateral complete BKA, left, initial encounter ([place]) 03/17/2019  . Critical lower limb ischemia ([place]) 03/12/2019  . Infection of amputation stump, right lower extremity ([place]) 03/10/2019  . Hypotension 02/23/2019  . Cellulitis of left foot 02/23/2019  . Gangrene of toe of left foot ([place]) 02/22/2019  . Complication of vascular dialysis catheter 11/24/2018  . Chronic osteomyelitis involving right ankle and foot ([place])   . Ulcer of right foot with fat layer exposed ([place]) 09/02/2018  . Encounter for planned post-operative wound closure   . Acute osteomyelitis of metatarsal bone of right foot ([place])   . Diabetic ulcer of midfoot associated with diabetes mellitus due to underlying condition, with necrosis of bone ([place])   . Osteomyelitis of right foot ([place])   . Vascular calcification   . Cellulitis and abscess of foot, except toes   . Anemia associated with chronic renal failure 07/13/2018  . Cardiomyopathy, dilated, nonischemic ([place]) 07/13/2018  . Diabetic foot infection ([place]) 07/13/2018  . PAD (peripheral artery disease) ([place])   . Macular degeneration   . Ventricular tachycardia ([place]) 06/24/2017  . Embolism due to vascular prosthetic devices, implants and grafts, initial encounter ([place]) 02/04/2016  . Dependence on renal dialysis ([place]) 05/30/2015  . Shortness of breath 02/05/2015  . Chronic systolic heart failure ([place]) 08/22/2014  . Diabetic  infection of right foot ([place]) 07/13/2014  . Type 2 diabetes mellitus with diabetic peripheral angiopathy without gangrene ([place]) 03/18/2014  . Anxiety disorder, unspecified 02/23/2014  . Fever, unspecified 02/23/2014  . Headache 02/23/2014  . Pain, unspecified 02/23/2014  . Pruritus, unspecified 02/23/2014  . Mild protein-calorie malnutrition ([place]) 12/26/2013  . ESRD (end stage renal disease) on dialysis ([place]) 11/11/2013  . Snoring 11/11/2013  . Unspecified sleep apnea 11/11/2013  . Other pancytopenia ([place]) 09/22/2013  . Hemoptysis 09/20/2013  . Personal history of nicotine dependence 07/12/2013  . Coagulation defect, unspecified ([place]) 05/16/2013  . Iron deficiency anemia, unspecified 05/06/2013  . Other disorders of plasma-protein metabolism, not elsewhere classified 04/15/2013  . 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 Other cardiomyopathies ([place]) 03/18/2013  . Secondary hyperparathyroidism of renal origin ([place]) 03/18/2013  . Type 2 diabetes mellitus without complications ([place]) 15/72/6203  . Pre-transplant evaluation for kidney transplant 01/11/2013  . Automatic implantable cardioverter-defibrillator in situ 10/01/2010  . HYPERCHOLESTEROLEMIA, MIXED 05/03/2010  . Essential hypertension 05/03/2010  . CHF 05/03/2010    Silvestre Mesi 12/27/2020, 4:01 PM  [facility] [address] [place], [place], [address] Phone: [phone]   Fax:  [phone]  Name: KALIN KYLER MRN:  [id number] Date of Birth: [birth year]

## 2020-12-28 DIAGNOSIS — N2581 Secondary hyperparathyroidism of renal origin: Secondary | ICD-10-CM | POA: Diagnosis not present

## 2020-12-28 DIAGNOSIS — N186 End stage renal disease: Secondary | ICD-10-CM | POA: Diagnosis not present

## 2020-12-28 DIAGNOSIS — D689 Coagulation defect, unspecified: Secondary | ICD-10-CM | POA: Diagnosis not present

## 2020-12-28 DIAGNOSIS — Z992 Dependence on renal dialysis: Secondary | ICD-10-CM | POA: Diagnosis not present

## 2020-12-28 DIAGNOSIS — E119 Type 2 diabetes mellitus without complications: Secondary | ICD-10-CM | POA: Diagnosis not present

## 2020-12-31 DIAGNOSIS — D689 Coagulation defect, unspecified: Secondary | ICD-10-CM | POA: Diagnosis not present

## 2020-12-31 DIAGNOSIS — N2581 Secondary hyperparathyroidism of renal origin: Secondary | ICD-10-CM | POA: Diagnosis not present

## 2020-12-31 DIAGNOSIS — N186 End stage renal disease: Secondary | ICD-10-CM | POA: Diagnosis not present

## 2020-12-31 DIAGNOSIS — Z992 Dependence on renal dialysis: Secondary | ICD-10-CM | POA: Diagnosis not present

## 2021-01-01 ENCOUNTER — Ambulatory Visit (INDEPENDENT_AMBULATORY_CARE_PROVIDER_SITE_OTHER): Payer: Medicare Other | Admitting: Podiatry

## 2021-01-01 ENCOUNTER — Encounter: Payer: Medicare Other | Admitting: Physical Therapy

## 2021-01-01 ENCOUNTER — Other Ambulatory Visit: Payer: Self-pay

## 2021-01-01 ENCOUNTER — Ambulatory Visit (INDEPENDENT_AMBULATORY_CARE_PROVIDER_SITE_OTHER): Payer: Medicare Other | Admitting: Physical Therapy

## 2021-01-01 DIAGNOSIS — M6281 Muscle weakness (generalized): Secondary | ICD-10-CM | POA: Diagnosis not present

## 2021-01-01 DIAGNOSIS — R2681 Unsteadiness on feet: Secondary | ICD-10-CM | POA: Diagnosis not present

## 2021-01-01 DIAGNOSIS — M25661 Stiffness of right knee, not elsewhere classified: Secondary | ICD-10-CM

## 2021-01-01 DIAGNOSIS — I739 Peripheral vascular disease, unspecified: Secondary | ICD-10-CM | POA: Diagnosis not present

## 2021-01-01 DIAGNOSIS — L97511 Non-pressure chronic ulcer of other part of right foot limited to breakdown of skin: Secondary | ICD-10-CM | POA: Diagnosis not present

## 2021-01-01 DIAGNOSIS — R293 Abnormal posture: Secondary | ICD-10-CM

## 2021-01-01 DIAGNOSIS — R2689 Other abnormalities of gait and mobility: Secondary | ICD-10-CM

## 2021-01-01 NOTE — Therapy (Signed)
[facility] [address] [place], [place], [address] Phone: [phone]   Fax:  [phone]  Physical Therapy Treatment  Patient Details  Name: Timothy Faulkner MRN: [id number] Date of Birth: February 19, 1946 Referring Provider (PT): Monica Martinez, MD   Encounter Date: 01/01/2021   PT End of Session - 01/01/21 1548    Visit Number 18    Number of Visits 39    Date for PT Re-Evaluation 12/24/20    Authorization Type UHC Medicare    Progress Note Due on Visit 20    PT Start Time 1514    PT Stop Time 1559    PT Time Calculation (min) 45 min    Equipment Utilized During Treatment Gait belt    Activity Tolerance Patient tolerated treatment well;Patient limited by fatigue    Behavior During Therapy [facility] for tasks assessed/performed           Past Medical History:  Diagnosis Date  . Anemia   . Arthritis    Gout  . Automatic implantable cardioverter-defibrillator in situ    [employer]  . CHF   . ESRD on dialysis [facility])    [name] 03/11/2013 (03/11/2013) dialysis M/W/F  . Gangrene ([place])    left foot  . GERD (gastroesophageal reflux disease)   . Gout    "once a year"  . Heart murmur   . HYPERCHOLESTEROLEMIA, MIXED   . HYPERTENSION   . Macular degeneration   . Osteomyelitis ([place])    right foot  . 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 Pacemaker   . Peripheral arterial disease (HCC)    left fifth toe ulcer, healing  . Pneumonia   . Shortness of breath   . 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 Wears dentures   . Wears glasses     Past Surgical History:  Procedure Laterality Date  . A/V FISTULAGRAM Left 07/20/2018   Procedure: A/V FISTULAGRAM;  Surgeon: [name], MD;  Location: [place] [facility];  Service: Cardiovascular;  Laterality: Left;  . A/V FISTULAGRAM N/A 07/15/2019   Procedure: A/V FISTULAGRAM - Left Arm;  Surgeon: Elam Dutch, MD;  Location: [place] [facility]  [facility];  Service: Cardiovascular;  Laterality: N/A;  . ABDOMINAL AORTOGRAM N/A 02/25/2019   Procedure: ABDOMINAL AORTOGRAM;  Surgeon: Elam Dutch, MD;  Location: [place] [facility];  Service: Cardiovascular;  Laterality: N/A;  . ABDOMINAL AORTOGRAM W/LOWER EXTREMITY Bilateral 07/20/2018   Procedure: ABDOMINAL AORTOGRAM W/LOWER EXTREMITY;  Surgeon: [name], MD;  Location: [place] [facility];  Service: Cardiovascular;  Laterality: Bilateral;  . AMPUTATION Right 09/07/2018   Procedure: Right fifth metatarsectomy;  Surgeon: [name], DPM;  Location: [place];  Service: Podiatry;  Laterality: Right;  . AMPUTATION Left 03/08/2019   Procedure: AMPUTATION  3RD AND 4TH TOES LEFT FOOT;  Surgeon: [name], DPM;  Location: [place];  Service: Podiatry;  Laterality: Left;  . AMPUTATION Left 03/15/2019   Procedure: AMPUTATION BELOW KNEE;  Surgeon: Elam Dutch, MD;  Location: [facility] OR;  Service: Vascular;  Laterality: Left;  . AV FISTULA PLACEMENT Right 12/13/2012   Procedure: ARTERIOVENOUS (AV) FISTULA CREATION;  Surgeon: [name], MD;  Location: [place];  Service: Vascular;  Laterality: Right;  Ultrasound guided  . AV FISTULA PLACEMENT Left 05/07/2016   Procedure: LEFT RADIOCEPHALIC ARTERIOVENOUS (AV) FISTULA CREATION;  Surgeon: [name], MD;  Location: [place];  Service:  Vascular;  Laterality: Left;  . AV FISTULA PLACEMENT Right 08/09/2019   Procedure: INSERTION OF RIGHT ARTERIOVENOUS (AV) 4-70m GORE-TEX STRETCH GRAFT RIGHT  ARM;  Surgeon: FElam Dutch MD;  [place]  Service: Vascular;  Laterality: Right;  . BASCILIC VEIN TRANSPOSITION Right 03/26/2016   Procedure: RIGHT BASILIC VEIN TRANSPOSITION;  Surgeon: TRosetta Posner MD;  [place]  Service: Vascular;  Laterality: Right;  . CARDIAC CATHETERIZATION    . CARDIAC DEFIBRILLATOR PLACEMENT     BChemical engineer . CATARACT EXTRACTION W/PHACO Bilateral   . EYE SURGERY Bilateral    Cataract  . FISTULOGRAM Left  04/22/2018   Procedure: FISTULOGRAM UPPER EXTREMITY;  Surgeon: CMarty Heck MD;  [place]  Service: Vascular;  Laterality: Left;  . I & D EXTREMITY Right 07/15/2018   Procedure: IRRIGATION AND DEBRIDEMENT RIGHT FOOT;  Surgeon: PEvelina Bucy DPM;  [place]  Service: Podiatry;  Laterality: Right;  . INCISION AND DRAINAGE ABSCESS / HEMATOMA OF BURSA / KNEE / THIGH Left 2012   "knee" (03/11/2013)  . INSERT / REPLACE / REMOVE PACEMAKER    . INSERTION OF DIALYSIS CATHETER Left 04/22/2018   Procedure: INSERTION OF DIALYSIS CATHETER;  Surgeon: CMarty Heck MD;  [place]  Service: Vascular;  Laterality: Left;  . IR FLUORO GUIDE CV LINE LEFT  07/15/2018  . IR PTA VENOUS EXCEPT DIALYSIS CIRCUIT  07/15/2018  . LOWER EXTREMITY ANGIOGRAPHY Right 07/21/2018   Procedure: LOWER EXTREMITY ANGIOGRAPHY;  Surgeon: CMarty Heck MD;  [place] [facility];  Service: Cardiovascular;  Laterality: Right;  . LOWER EXTREMITY ANGIOGRAPHY Bilateral 02/25/2019   Procedure: Lower Extremity Angiography;  Surgeon: FElam Dutch MD;  [facility];  Service: Cardiovascular;  Laterality: Bilateral;  . METATARSAL HEAD EXCISION Right 07/15/2018   Procedure: METATARSAL RESECTION;  Surgeon: PEvelina Bucy DPM;  [place]  Service: Podiatry;  Laterality: Right;  . MULTIPLE TOOTH EXTRACTIONS    . PERIPHERAL VASCULAR ATHERECTOMY Right 02/28/2019   Procedure: PERIPHERAL VASCULAR ATHERECTOMY;  Surgeon: CWaynetta Sandy MD;  [place] [facility];  Service: Cardiovascular;  Laterality: Right;  right tp trunk   . PERIPHERAL VASCULAR BALLOON ANGIOPLASTY Left 02/25/2019   Procedure: PERIPHERAL VASCULAR BALLOON ANGIOPLASTY;  Surgeon: FElam Dutch MD;  [place];  Service: Cardiovascular;  Laterality: Left;  tibial peroneal trunk and PT  . PERIPHERAL VASCULAR INTERVENTION Right 07/21/2018   Procedure: PERIPHERAL VASCULAR  INTERVENTION;  Surgeon: CMarty Heck MD;  [place];  Service: Cardiovascular;  Laterality: Right;  peroneal stents  . REVISON OF ARTERIOVENOUS FISTULA Right 35/18/8416  Procedure: Plication OF Right Arm ARTERIOVENOUS FISTULA;  Surgeon: CElam Dutch MD;  [facility]  Service: Vascular;  Laterality: Right;  . REVISON OF ARTERIOVENOUS FISTULA Left 04/22/2018   Procedure: REVISION OF RADIOCEPHALIC ARTERIOVENOUS FISTULA;  Surgeon: CMarty Heck MD;  [place]  Service: Vascular;  Laterality: Left;  . SHUNTOGRAM N/A 05/31/2013   Procedure: SEarney Mallet  Surgeon: VSerafina Mitchell MD;  [facility];  Service: Cardiovascular;  Laterality: N/A;  . UPPER EXTREMITY VENOGRAPHY Right 07/23/2018   Procedure: UPPER EXTREMITY VENOGRAPHY;  Surgeon: DAngelia Mould MD;  [place];  Service: Cardiovascular;  Laterality: Right;  . UPPER EXTREMITY VENOGRAPHY  07/15/2019   Procedure: UPPER EXTREMITY VENOGRAPHY;  Surgeon: FElam Dutch MD;  [place];  Service: Cardiovascular;;  rt arm   . WOUND DEBRIDEMENT Right 07/17/2018   Procedure: Wound Debridement; Closure Filleted toe flap Right Foot;  Surgeon: [name], DPM;  Location: [place];  Service: Podiatry;  Laterality: Right;  . WOUND DEBRIDEMENT Right 09/07/2018   Procedure: Debridement Right Foot Wound, application of wound vac;  Surgeon: [name], DPM;  Location: [place];  Service: Podiatry;  Laterality: Right;  . WOUND DEBRIDEMENT Right 03/08/2019   Procedure: DEBRIDEMENT WOUND RIGHT FOOT;  Surgeon: [name], DPM;  Location: [place];  Service: Podiatry;  Laterality: Right;    There were no vitals filed for this visit.   Subjective Assessment - 01/01/21 1547    Subjective He relays he is getting better functionally.    Pertinent History PAD, DM2, arthritis, gout, CHF, implanted cardioverter-defib, HTN, CKD w/ ESRD    Patient Stated Goals  to use prosthesis to walk in community,                             [facility] Adult PT Treatment/Exercise - 01/01/21 0001      Transfers   Transfers Sit to Stand;Stand to Sit    Sit to Stand 5: Supervision;With upper extremity assist;With armrests;From chair/3-in-1;Other (comment)    Sit to Stand Details Verbal cues for sequencing;Verbal cues for technique;Visual cues/gestures for sequencing    Stand to Sit 5: Supervision;With upper extremity assist;With armrests;To chair/3-in-1;Other (comment)      Ambulation/Gait   Ambulation/Gait Yes    Ambulation/Gait Assistance 5: Supervision    Ambulation Distance (Feet) 160 Feet   then 75   Assistive device Rolling walker;Prosthesis    Gait Pattern Step-to pattern;Decreased step length - right;Decreased stance time - left;Decreased stride length;Decreased hip/knee flexion - left;Decreased weight shift to left;Left hip hike;Trunk flexed;Abducted - left;Poor foot clearance - right    Ramp 5: Supervision    Ramp Details (indicate cue type and reason) verbal cues    Curb 5: Supervision    Curb Details (indicate cue type and reason) 6.5 inch with RW and cues      Knee/Hip Exercises: Stretches   Active Hamstring Stretch Left;3 reps;30 seconds    Active Hamstring Stretch Limitations at leg press      Knee/Hip Exercises: Aerobic   Nustep seat 12 Level 5 for 8 min      Knee/Hip Exercises: Machines for Strengthening   Cybex Leg Press BLEs 100# 3X10      Knee/Hip Exercises: Standing   Other Standing Knee Exercises modified tandem balance 30 sec X2 bilat, unilat alternating    Other Standing Knee Exercises hip 4 way red X10 ea with bilat UE support      Knee/Hip Exercises: Seated   Sit to Sand 2 sets;5 reps;without UE support   24 inch bar stool                   PT Short Term Goals - 12/18/20 1653      PT SHORT TERM GOAL #1   Title Patient verbalizes proper adjustment of ply socks.    Time 4    Period Weeks     Status On-going    Target Date 01/17/21      PT SHORT TERM GOAL #2   Title Patient verbalize prosthesis wear within 39min of arising in mornings.    Time 4    Period Weeks    Status Revised    Target Date 01/17/21  PT SHORT TERM GOAL #3   Title Patient standing balance with RW & prosthesis reaching 10" anteriorly & within 5" of floor with supervision.    Time 4    Period Weeks    Status Revised    Target Date 01/17/21      PT SHORT TERM GOAL #4   Title Patient ambulates 75' with RW & prosthesis with supervision.    Time 4    Period Weeks    Status Revised    Target Date 01/17/21      PT SHORT TERM GOAL #5   Title Patient negotiates ramps & curbs with RW & prostheis with modA    Time 4    Period Weeks    Status On-going    Target Date 01/17/21             PT Long Term Goals - 12/18/20 1658      PT LONG TERM GOAL #1   Title Patient & family verbalize & demonstrate understanding of proper prosthetic care to enable utilization of prosthesis.    Time 12    Period Weeks    Status On-going    Target Date 03/14/21      PT LONG TERM GOAL #2   Title Patient tolerates wear of prosthesis >90% of awake hours without skin or limb discomfort issues.    Time 12    Period Weeks    Status On-going    Target Date 03/14/21      PT LONG TERM GOAL #3   Title Tasks of Oceanographer with RW  >/= 45/56    Time 12    Period Weeks    Status Revised    Target Date 03/14/21      PT LONG TERM GOAL #4   Title Patient ambulates 250' with RW & prosthesis modified independent.    Time 12    Period Weeks    Status Revised    Target Date 03/14/21      PT LONG TERM GOAL #5   Title Patient negotiates ramps & curbs with RW & prosthesis with supervision    Time 12    Period Weeks    Status Revised    Target Date 03/14/21                 Plan - 01/01/21 1553    Clinical Impression Statement He had improved performance with navigating ramp and curb today. Overall is  progressing with strength and ambulation but still with limitations and will continue to benefit from PT.    Personal Factors and Comorbidities Comorbidity 3+;Fitness;Time since onset of injury/illness/exacerbation    Comorbidities PAD, DM2, arthritis, gout, CHF, implanted cardioverter-defib, HTN, CKD w/ ESRD, blind left knee    Examination-Activity Limitations Locomotion Level;Stairs;Stand;Transfers    Examination-Participation Restrictions Community Activity    Stability/Clinical Decision Making Evolving/Moderate complexity    Rehab Potential Good    PT Frequency 2x / week    PT Duration 12 weeks    PT Treatment/Interventions ADLs/Self Care Home Management;DME Instruction;Gait training;Stair training;Functional mobility training;Therapeutic activities;Therapeutic exercise;Balance training;Neuromuscular re-education;Patient/family education;Prosthetic Training;Manual techniques;Vestibular    PT Next Visit Plan progress as tolerated prosthetic gait with RW including ramps & curbs,  exercise for strength, balance, flexibility & endurance.    Consulted and Agree with Plan of Care Patient;Family member/caregiver    Family Member Consulted daughter,Sherry Cira Servant           Patient will benefit from skilled therapeutic intervention in order to improve  the following deficits and impairments:  Abnormal gait,Cardiopulmonary status limiting activity,Decreased activity tolerance,Decreased balance,Decreased endurance,Decreased knowledge of use of DME,Decreased mobility,Decreased range of motion,Decreased scar mobility,Decreased strength,Increased edema,Postural dysfunction,Prosthetic Dependency  Visit Diagnosis: Other abnormalities of gait and mobility  Muscle weakness (generalized)  Stiffness of right knee, not elsewhere classified  Abnormal posture  Unsteadiness on feet     Problem List Patient Active Problem List   Diagnosis Date Noted  . Polycythemia, secondary 02/21/2020  . Stable  treated proliferative diabetic retinopathy of right eye determined by examination associated with type 2 diabetes mellitus ([place]) 11/29/2019  . Stable treated proliferative diabetic retinopathy of left eye without macular edema determined by examination associated with type 2 diabetes mellitus ([place]) 11/29/2019  . Advanced nonexudative age-related macular degeneration of both eyes with subfoveal involvement 11/29/2019  . 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  . Sepsis ([place]) 04/14/2019  . Hypocalcemia 04/08/2019  . Drug induced constipation   . Anemia of chronic disease   . ESRD on dialysis ([place])   . Postoperative pain   . Phantom limb pain ([place])   . S/P BKA (below knee amputation), left ([place]) 03/18/2019  . Unilateral complete BKA, left, initial encounter ([place]) 03/17/2019  . Critical lower limb ischemia ([place]) 03/12/2019  . Infection of amputation stump, right lower extremity ([place]) 03/10/2019  . Hypotension 02/23/2019  . Cellulitis of left foot 02/23/2019  . Gangrene of toe of left foot ([place]) 02/22/2019  . Complication of vascular dialysis catheter 11/24/2018  . Chronic osteomyelitis involving right ankle and foot ([place])   . Ulcer of right foot with fat layer exposed ([place]) 09/02/2018  . Encounter for planned post-operative wound closure   . Acute osteomyelitis of metatarsal bone of right foot ([place])   . Diabetic ulcer of midfoot associated with diabetes mellitus due to underlying condition, with necrosis of bone ([place])   . Osteomyelitis of right foot ([place])   . Vascular calcification   . Cellulitis and abscess of foot, except toes   . Anemia associated with chronic renal failure 07/13/2018  . Cardiomyopathy, dilated, nonischemic ([place]) 07/13/2018  . Diabetic foot infection ([place]) 07/13/2018  . PAD (peripheral artery disease) ([place])   . Macular degeneration   . Ventricular tachycardia ([place]) 06/24/2017  . Embolism due to vascular prosthetic devices, implants and grafts, initial  encounter ([place]) 02/04/2016  . Dependence on renal dialysis ([place]) 05/30/2015  . Shortness of breath 02/05/2015  . Chronic systolic heart failure ([place]) 08/22/2014  . Diabetic infection of right foot ([place]) 07/13/2014  . Type 2 diabetes mellitus with diabetic peripheral angiopathy without gangrene ([place]) 03/18/2014  . Anxiety disorder, unspecified 02/23/2014  . Fever, unspecified 02/23/2014  . Headache 02/23/2014  . Pain, unspecified 02/23/2014  . Pruritus, unspecified 02/23/2014  . Mild protein-calorie malnutrition ([place]) 12/26/2013  . ESRD (end stage renal disease) on dialysis ([place]) 11/11/2013  . Snoring 11/11/2013  . Unspecified sleep apnea 11/11/2013  . Other pancytopenia ([place]) 09/22/2013  . Hemoptysis 09/20/2013  . Personal history of nicotine dependence 07/12/2013  . Coagulation defect, unspecified ([place]) 05/16/2013  . Iron deficiency anemia, unspecified 05/06/2013  . Other disorders of plasma-protein metabolism, not elsewhere classified 04/15/2013  . 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 chronic kidney disease or end stage renal disease ([place]) 03/18/2013  . Other cardiomyopathies ([place]) 03/18/2013  . Secondary hyperparathyroidism of renal origin ([place]) 03/18/2013  . Type 2 diabetes mellitus without complications ([place]) 45/80/9983  . Pre-transplant evaluation for kidney transplant 01/11/2013  . Automatic implantable cardioverter-defibrillator in situ  10/01/2010  . HYPERCHOLESTEROLEMIA, MIXED 05/03/2010  . Essential hypertension 05/03/2010  . CHF 05/03/2010    Silvestre Mesi 01/01/2021, 3:58 PM  [facility] Physical Therapy [address] [place], [place], [address] Phone: [phone]   Fax:  [phone]  Name: Timothy Faulkner MRN: [id number] Date of Birth: Oct 13, 1945

## 2021-01-01 NOTE — Progress Notes (Signed)
  Subjective:  Patient ID: Timothy Faulkner, male    DOB: Dec 23, 1945,  MRN: [id number]  Chief Complaint  Patient presents with  . Wound Check    Denies any f/c/n/v/concerns.    75 y.o. male presents for wound care. Hx confirmed with patient. Using santyl as directed. Objective:  Physical Exam: Wound Location: right 2nd toe, 4th toe Wound Measurement: epithelialized. Wound Base: epithelial Peri-wound: Normal Exudate: None: wound tissue dry wound without warmth, erythema, signs of acute infection  Assessment:   1. Skin ulcer of fourth toe, right, limited to breakdown of skin ([place])   2. PAD (peripheral artery disease) ([place])    Plan:  Patient was evaluated and treated and all questions answered.  Ulcer right 2nd/4th toes -Appears essentially healed. -Dressed with triple abx and band-aid. Patient to do so daily. -F/u in 1 month for recheck.   Return in about 1 month (around 02/01/2021) for Wound Care.

## 2021-01-02 DIAGNOSIS — N2581 Secondary hyperparathyroidism of renal origin: Secondary | ICD-10-CM | POA: Diagnosis not present

## 2021-01-02 DIAGNOSIS — Z992 Dependence on renal dialysis: Secondary | ICD-10-CM | POA: Diagnosis not present

## 2021-01-02 DIAGNOSIS — N186 End stage renal disease: Secondary | ICD-10-CM | POA: Diagnosis not present

## 2021-01-02 DIAGNOSIS — D689 Coagulation defect, unspecified: Secondary | ICD-10-CM | POA: Diagnosis not present

## 2021-01-03 ENCOUNTER — Encounter: Payer: Medicare Other | Admitting: Physical Therapy

## 2021-01-04 DIAGNOSIS — N186 End stage renal disease: Secondary | ICD-10-CM | POA: Diagnosis not present

## 2021-01-04 DIAGNOSIS — D689 Coagulation defect, unspecified: Secondary | ICD-10-CM | POA: Diagnosis not present

## 2021-01-04 DIAGNOSIS — Z992 Dependence on renal dialysis: Secondary | ICD-10-CM | POA: Diagnosis not present

## 2021-01-04 DIAGNOSIS — N2581 Secondary hyperparathyroidism of renal origin: Secondary | ICD-10-CM | POA: Diagnosis not present

## 2021-01-07 DIAGNOSIS — Z992 Dependence on renal dialysis: Secondary | ICD-10-CM | POA: Diagnosis not present

## 2021-01-07 DIAGNOSIS — D689 Coagulation defect, unspecified: Secondary | ICD-10-CM | POA: Diagnosis not present

## 2021-01-07 DIAGNOSIS — N2581 Secondary hyperparathyroidism of renal origin: Secondary | ICD-10-CM | POA: Diagnosis not present

## 2021-01-07 DIAGNOSIS — N186 End stage renal disease: Secondary | ICD-10-CM | POA: Diagnosis not present

## 2021-01-08 DIAGNOSIS — I12 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 chronic kidney disease or end stage renal disease: Secondary | ICD-10-CM | POA: Diagnosis not present

## 2021-01-08 DIAGNOSIS — Z992 Dependence on renal dialysis: Secondary | ICD-10-CM | POA: Diagnosis not present

## 2021-01-08 DIAGNOSIS — N186 End stage renal disease: Secondary | ICD-10-CM | POA: Diagnosis not present

## 2021-01-09 DIAGNOSIS — T82858A Stenosis of vascular prosthetic devices, implants and grafts, initial encounter: Secondary | ICD-10-CM | POA: Diagnosis not present

## 2021-01-09 DIAGNOSIS — Z992 Dependence on renal dialysis: Secondary | ICD-10-CM | POA: Diagnosis not present

## 2021-01-09 DIAGNOSIS — N186 End stage renal disease: Secondary | ICD-10-CM | POA: Diagnosis not present

## 2021-01-09 DIAGNOSIS — I871 Compression of vein: Secondary | ICD-10-CM | POA: Diagnosis not present

## 2021-01-10 ENCOUNTER — Encounter: Payer: Medicare Other | Admitting: Physical Therapy

## 2021-01-11 DIAGNOSIS — Z992 Dependence on renal dialysis: Secondary | ICD-10-CM | POA: Diagnosis not present

## 2021-01-11 DIAGNOSIS — D689 Coagulation defect, unspecified: Secondary | ICD-10-CM | POA: Diagnosis not present

## 2021-01-11 DIAGNOSIS — N2581 Secondary hyperparathyroidism of renal origin: Secondary | ICD-10-CM | POA: Diagnosis not present

## 2021-01-11 DIAGNOSIS — N186 End stage renal disease: Secondary | ICD-10-CM | POA: Diagnosis not present

## 2021-01-14 DIAGNOSIS — D689 Coagulation defect, unspecified: Secondary | ICD-10-CM | POA: Diagnosis not present

## 2021-01-14 DIAGNOSIS — N186 End stage renal disease: Secondary | ICD-10-CM | POA: Diagnosis not present

## 2021-01-14 DIAGNOSIS — Z992 Dependence on renal dialysis: Secondary | ICD-10-CM | POA: Diagnosis not present

## 2021-01-14 DIAGNOSIS — N2581 Secondary hyperparathyroidism of renal origin: Secondary | ICD-10-CM | POA: Diagnosis not present

## 2021-01-15 ENCOUNTER — Encounter: Payer: Self-pay | Admitting: Physical Therapy

## 2021-01-15 ENCOUNTER — Other Ambulatory Visit: Payer: Self-pay

## 2021-01-15 ENCOUNTER — Ambulatory Visit (INDEPENDENT_AMBULATORY_CARE_PROVIDER_SITE_OTHER): Payer: Medicare Other | Admitting: Physical Therapy

## 2021-01-15 DIAGNOSIS — M6281 Muscle weakness (generalized): Secondary | ICD-10-CM | POA: Diagnosis not present

## 2021-01-15 DIAGNOSIS — R2689 Other abnormalities of gait and mobility: Secondary | ICD-10-CM

## 2021-01-15 DIAGNOSIS — R293 Abnormal posture: Secondary | ICD-10-CM

## 2021-01-15 DIAGNOSIS — R2681 Unsteadiness on feet: Secondary | ICD-10-CM

## 2021-01-15 DIAGNOSIS — M25661 Stiffness of right knee, not elsewhere classified: Secondary | ICD-10-CM | POA: Diagnosis not present

## 2021-01-15 NOTE — Therapy (Signed)
[facility] [address] [place], [place], [address] Phone: [phone]   Fax:  [phone]  Physical Therapy Treatment  Patient Details  Name: Timothy Faulkner MRN: [id number] Date of Birth: 29-May-1946 Referring Provider (PT): Monica Martinez, MD   Encounter Date: 01/15/2021   PT End of Session - 01/15/21 1435    Visit Number 19    Number of Visits 39    Date for PT Re-Evaluation 12/24/20    Authorization Type UHC Medicare    Progress Note Due on Visit 20    PT Start Time 1430    PT Stop Time 1513    PT Time Calculation (min) 43 min    Equipment Utilized During Treatment Gait belt    Activity Tolerance Patient tolerated treatment well;Patient limited by fatigue    Behavior During Therapy [facility] for tasks assessed/performed           Past Medical History:  Diagnosis Date  . Anemia   . Arthritis    Gout  . Automatic implantable cardioverter-defibrillator in situ    [employer]  . CHF   . ESRD on dialysis [facility])    [name] 03/11/2013 (03/11/2013) dialysis M/W/F  . Gangrene ([place])    left foot  . GERD (gastroesophageal reflux disease)   . Gout    "once a year"  . Heart murmur   . HYPERCHOLESTEROLEMIA, MIXED   . HYPERTENSION   . Macular degeneration   . Osteomyelitis ([place])    right foot  . 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 Pacemaker   . Peripheral arterial disease (HCC)    left fifth toe ulcer, healing  . Pneumonia   . Shortness of breath   . 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 Wears dentures   . Wears glasses     Past Surgical History:  Procedure Laterality Date  . A/V FISTULAGRAM Left 07/20/2018   Procedure: A/V FISTULAGRAM;  Surgeon: [name], MD;  Location: [place] [facility];  Service: Cardiovascular;  Laterality: Left;  . A/V FISTULAGRAM N/A 07/15/2019   Procedure: A/V FISTULAGRAM - Left Arm;  Surgeon: Elam Dutch, MD;  Location: [place] CV  LAB;  Service: Cardiovascular;  Laterality: N/A;  . ABDOMINAL AORTOGRAM N/A 02/25/2019   Procedure: ABDOMINAL AORTOGRAM;  Surgeon: Elam Dutch, MD;  Location: [place] [facility];  Service: Cardiovascular;  Laterality: N/A;  . ABDOMINAL AORTOGRAM W/LOWER EXTREMITY Bilateral 07/20/2018   Procedure: ABDOMINAL AORTOGRAM W/LOWER EXTREMITY;  Surgeon: [name], MD;  Location: [place] [facility] LAB;  Service: Cardiovascular;  Laterality: Bilateral;  . AMPUTATION Right 09/07/2018   Procedure: Right fifth metatarsectomy;  Surgeon: [name], DPM;  Location: [place];  Service: Podiatry;  Laterality: Right;  . AMPUTATION Left 03/08/2019   Procedure: AMPUTATION  3RD AND 4TH TOES LEFT FOOT;  Surgeon: [name], DPM;  Location: [place];  Service: Podiatry;  Laterality: Left;  . AMPUTATION Left 03/15/2019   Procedure: AMPUTATION BELOW KNEE;  Surgeon: Elam Dutch, MD;  Location: [facility] OR;  Service: Vascular;  Laterality: Left;  . AV FISTULA PLACEMENT Right 12/13/2012   Procedure: ARTERIOVENOUS (AV) FISTULA CREATION;  Surgeon: [name], MD;  Location: [place];  Service: Vascular;  Laterality: Right;  Ultrasound guided  . AV FISTULA PLACEMENT Left 05/07/2016   Procedure: LEFT RADIOCEPHALIC ARTERIOVENOUS (AV) FISTULA CREATION;  Surgeon: [name], MD;  Location: [place];  Service:  Vascular;  Laterality: Left;  . AV FISTULA PLACEMENT Right 08/09/2019   Procedure: INSERTION OF RIGHT ARTERIOVENOUS (AV) 4-70m GORE-TEX STRETCH GRAFT RIGHT  ARM;  Surgeon: FElam Dutch MD;  [place]  Service: Vascular;  Laterality: Right;  . BASCILIC VEIN TRANSPOSITION Right 03/26/2016   Procedure: RIGHT BASILIC VEIN TRANSPOSITION;  Surgeon: TRosetta Posner MD;  [place]  Service: Vascular;  Laterality: Right;  . CARDIAC CATHETERIZATION    . CARDIAC DEFIBRILLATOR PLACEMENT     BChemical engineer . CATARACT EXTRACTION W/PHACO Bilateral   . EYE SURGERY Bilateral    Cataract  . FISTULOGRAM Left  04/22/2018   Procedure: FISTULOGRAM UPPER EXTREMITY;  Surgeon: CMarty Heck MD;  [place]  Service: Vascular;  Laterality: Left;  . I & D EXTREMITY Right 07/15/2018   Procedure: IRRIGATION AND DEBRIDEMENT RIGHT FOOT;  Surgeon: PEvelina Bucy DPM;  [place]  Service: Podiatry;  Laterality: Right;  . INCISION AND DRAINAGE ABSCESS / HEMATOMA OF BURSA / KNEE / THIGH Left 2012   "knee" (03/11/2013)  . INSERT / REPLACE / REMOVE PACEMAKER    . INSERTION OF DIALYSIS CATHETER Left 04/22/2018   Procedure: INSERTION OF DIALYSIS CATHETER;  Surgeon: CMarty Heck MD;  [place]  Service: Vascular;  Laterality: Left;  . IR FLUORO GUIDE CV LINE LEFT  07/15/2018  . IR PTA VENOUS EXCEPT DIALYSIS CIRCUIT  07/15/2018  . LOWER EXTREMITY ANGIOGRAPHY Right 07/21/2018   Procedure: LOWER EXTREMITY ANGIOGRAPHY;  Surgeon: CMarty Heck MD;  [place];  Service: Cardiovascular;  Laterality: Right;  . LOWER EXTREMITY ANGIOGRAPHY Bilateral 02/25/2019   Procedure: Lower Extremity Angiography;  Surgeon: FElam Dutch MD;  [place];  Service: Cardiovascular;  Laterality: Bilateral;  . METATARSAL HEAD EXCISION Right 07/15/2018   Procedure: METATARSAL RESECTION;  Surgeon: PEvelina Bucy DPM;  [place]  Service: Podiatry;  Laterality: Right;  . MULTIPLE TOOTH EXTRACTIONS    . PERIPHERAL VASCULAR ATHERECTOMY Right 02/28/2019   Procedure: PERIPHERAL VASCULAR ATHERECTOMY;  Surgeon: CWaynetta Sandy MD;  [place];  Service: Cardiovascular;  Laterality: Right;  right tp trunk   . PERIPHERAL VASCULAR BALLOON ANGIOPLASTY Left 02/25/2019   Procedure: PERIPHERAL VASCULAR BALLOON ANGIOPLASTY;  Surgeon: FElam Dutch MD;  [place];  Service: Cardiovascular;  Laterality: Left;  tibial peroneal trunk and PT  . PERIPHERAL VASCULAR INTERVENTION Right 07/21/2018   Procedure: PERIPHERAL VASCULAR  INTERVENTION;  Surgeon: CMarty Heck MD;  [place];  Service: Cardiovascular;  Laterality: Right;  peroneal stents  . REVISON OF ARTERIOVENOUS FISTULA Right 35/18/8416  Procedure: Plication OF Right Arm ARTERIOVENOUS FISTULA;  Surgeon: CElam Dutch MD;  [facility]  Service: Vascular;  Laterality: Right;  . REVISON OF ARTERIOVENOUS FISTULA Left 04/22/2018   Procedure: REVISION OF RADIOCEPHALIC ARTERIOVENOUS FISTULA;  Surgeon: CMarty Heck MD;  [place]  Service: Vascular;  Laterality: Left;  . SHUNTOGRAM N/A 05/31/2013   Procedure: SEarney Mallet  Surgeon: VSerafina Mitchell MD;  [facility];  Service: Cardiovascular;  Laterality: N/A;  . UPPER EXTREMITY VENOGRAPHY Right 07/23/2018   Procedure: UPPER EXTREMITY VENOGRAPHY;  Surgeon: DAngelia Mould MD;  [place] [facility];  Service: Cardiovascular;  Laterality: Right;  . UPPER EXTREMITY VENOGRAPHY  07/15/2019   Procedure: UPPER EXTREMITY VENOGRAPHY;  Surgeon: FElam Dutch MD;  [place];  Service: Cardiovascular;;  rt arm   . WOUND DEBRIDEMENT Right 07/17/2018   Procedure: Wound Debridement; Closure Filleted toe flap Right Foot;  Surgeon: [name], DPM;  Location: [place];  Service: Podiatry;  Laterality: Right;  . WOUND DEBRIDEMENT Right 09/07/2018   Procedure: Debridement Right Foot Wound, application of wound vac;  Surgeon: [name], DPM;  Location: [place];  Service: Podiatry;  Laterality: Right;  . WOUND DEBRIDEMENT Right 03/08/2019   Procedure: DEBRIDEMENT WOUND RIGHT FOOT;  Surgeon: [name], DPM;  Location: [place];  Service: Podiatry;  Laterality: Right;    There were no vitals filed for this visit.   Subjective Assessment - 01/15/21 1430    Subjective He walks with walker in his home.  He puts prosthesis upon arising every day including dialysis. He wears prosthesis most of awake hours. Foot doctor said to continue wound  shoe on right foot until he sees him again in ~month.    Pertinent History PAD, DM2, arthritis, gout, CHF, implanted cardioverter-defib, HTN, CKD w/ ESRD    Patient Stated Goals to use prosthesis to walk in community,    Currently in Pain? No/denies                             [facility] Adult PT Treatment/Exercise - 01/15/21 1430      Transfers   Transfers Sit to Stand;Stand to Sit    Sit to Stand 5: Supervision;With upper extremity assist;With armrests;From chair/3-in-1;Other (comment)    Sit to Stand Details Verbal cues for sequencing;Verbal cues for technique;Visual cues/gestures for sequencing    Stand to Sit 5: Supervision;With upper extremity assist;With armrests;To chair/3-in-1;Other (comment)      Ambulation/Gait   Ambulation/Gait Yes    Ambulation/Gait Assistance 5: Supervision    Ambulation/Gait Assistance Details PT demo & verbal cues on not adducting RLE to middle of RW & prosthesis/LLE abducted.    Ambulation Distance (Feet) 212 Feet    Assistive device Rolling walker;Prosthesis    Gait Pattern Step-to pattern;Decreased step length - right;Decreased stance time - left;Decreased stride length;Decreased hip/knee flexion - left;Decreased weight shift to left;Left hip hike;Trunk flexed;Abducted - left;Poor foot clearance - right    Ramp 4: Min assist   min guard descend & minA ascend, RW & prosthesis   Ramp Details (indicate cue type and reason) tactile & verbal cues on technique    Curb 4: Min assist   min guard, RW & prosthesis   Curb Details (indicate cue type and reason) tactile & verbal cues on technique      Knee/Hip Exercises: Stretches   Active Hamstring Stretch Both;2 reps;30 seconds    Active Hamstring Stretch Limitations supine SLR with strap bw sets on leg press    Passive Hamstring Stretch Limitations PT instructed with demo & verbal cues on sitting with single LE straight on another chair or on side of bed. PT recommended 5 min ea LE. Pt verbalized  understanding.      Knee/Hip Exercises: Aerobic   Nustep seat 12 Level 5 for 8 min      Knee/Hip Exercises: Machines for Strengthening   Cybex Leg Press BLEs 100# 3sets X10reps with back flat      Knee/Hip Exercises: Standing   Other Standing Knee Exercises --    Other Standing Knee Exercises --      Knee/Hip Exercises: Seated   Sit to Sand 2 sets;5 reps;without UE support   24 inch  bar stool     Prosthetics   Current prosthetic wear tolerance (days/week)  daily including dialysis    Current prosthetic wear tolerance (#hours/day)  ~90% of awake hours    Residual limb condition  No open areas, dry skin, normal temperature, normal color, cylinerical shape                    PT Short Term Goals - 01/15/21 1606      PT SHORT TERM GOAL #1   Title Patient verbalizes proper adjustment of ply socks.    Time 4    Period Weeks    Status On-going    Target Date 01/17/21      PT SHORT TERM GOAL #2   Title Patient verbalize prosthesis wear within 26min of arising in mornings.    Time 4    Period Weeks    Status Achieved    Target Date 01/17/21      PT SHORT TERM GOAL #3   Title Patient standing balance with RW & prosthesis reaching 10" anteriorly & within 5" of floor with supervision.    Time 4    Period Weeks    Status On-going    Target Date 01/17/21      PT SHORT TERM GOAL #4   Title Patient ambulates 29' with RW & prosthesis with supervision.    Time 4    Period Weeks    Status Achieved    Target Date 01/17/21      PT SHORT TERM GOAL #5   Title Patient negotiates ramps & curbs with RW & prostheis with modA    Time 4    Period Weeks    Status Achieved    Target Date 01/17/21             PT Long Term Goals - 12/18/20 1658      PT LONG TERM GOAL #1   Title Patient & family verbalize & demonstrate understanding of proper prosthetic care to enable utilization of prosthesis.    Time 12    Period Weeks    Status On-going    Target Date 03/14/21       PT LONG TERM GOAL #2   Title Patient tolerates wear of prosthesis >90% of awake hours without skin or limb discomfort issues.    Time 12    Period Weeks    Status On-going    Target Date 03/14/21      PT LONG TERM GOAL #3   Title Tasks of Oceanographer with RW  >/= 45/56    Time 12    Period Weeks    Status Revised    Target Date 03/14/21      PT LONG TERM GOAL #4   Title Patient ambulates 250' with RW & prosthesis modified independent.    Time 12    Period Weeks    Status Revised    Target Date 03/14/21      PT LONG TERM GOAL #5   Title Patient negotiates ramps & curbs with RW & prosthesis with supervision    Time 12    Period Weeks    Status Revised    Target Date 03/14/21                 Plan - 01/15/21 1437    Clinical Impression Statement Patient met 3 STGs checked today and PT anticipates meeting the other 2 next session.  PT worked on flexibiity & strength with leg press  for fuller knee extension.    Personal Factors and Comorbidities Comorbidity 3+;Fitness;Time since onset of injury/illness/exacerbation    Comorbidities PAD, DM2, arthritis, gout, CHF, implanted cardioverter-defib, HTN, CKD w/ ESRD, blind left knee    Examination-Activity Limitations Locomotion Level;Stairs;Stand;Transfers    Examination-Participation Restrictions Community Activity    Stability/Clinical Decision Making Evolving/Moderate complexity    Rehab Potential Good    PT Frequency 2x / week    PT Duration 12 weeks    PT Treatment/Interventions ADLs/Self Care Home Management;DME Instruction;Gait training;Stair training;Functional mobility training;Therapeutic activities;Therapeutic exercise;Balance training;Neuromuscular re-education;Patient/family education;Prosthetic Training;Manual techniques;Vestibular    PT Next Visit Plan progress as tolerated prosthetic gait with RW including ramps & curbs,  exercise for strength, balance, flexibility & endurance.    Consulted and Agree with Plan of  Care Patient;Family member/caregiver    Family Member Consulted daughter,Sherry Cira Servant           Patient will benefit from skilled therapeutic intervention in order to improve the following deficits and impairments:  Abnormal gait,Cardiopulmonary status limiting activity,Decreased activity tolerance,Decreased balance,Decreased endurance,Decreased knowledge of use of DME,Decreased mobility,Decreased range of motion,Decreased scar mobility,Decreased strength,Increased edema,Postural dysfunction,Prosthetic Dependency  Visit Diagnosis: Other abnormalities of gait and mobility  Muscle weakness (generalized)  Stiffness of right knee, not elsewhere classified  Abnormal posture  Unsteadiness on feet     Problem List Patient Active Problem List   Diagnosis Date Noted  . Polycythemia, secondary 02/21/2020  . Stable treated proliferative diabetic retinopathy of right eye determined by examination associated with type 2 diabetes mellitus ([place]) 11/29/2019  . Stable treated proliferative diabetic retinopathy of left eye without macular edema determined by examination associated with type 2 diabetes mellitus ([place]) 11/29/2019  . Advanced nonexudative age-related macular degeneration of both eyes with subfoveal involvement 11/29/2019  . 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  . Sepsis ([place]) 04/14/2019  . Hypocalcemia 04/08/2019  . Drug induced constipation   . Anemia of chronic disease   . ESRD on dialysis ([place])   . Postoperative pain   . Phantom limb pain ([place])   . S/P BKA (below knee amputation), left ([place]) 03/18/2019  . Unilateral complete BKA, left, initial encounter ([place]) 03/17/2019  . Critical lower limb ischemia ([place]) 03/12/2019  . Infection of amputation stump, right lower extremity ([place]) 03/10/2019  . Hypotension 02/23/2019  . Cellulitis of left foot 02/23/2019  . Gangrene of toe of left foot ([place]) 02/22/2019  . Complication of vascular dialysis catheter 11/24/2018   . Chronic osteomyelitis involving right ankle and foot ([place])   . Ulcer of right foot with fat layer exposed ([place]) 09/02/2018  . Encounter for planned post-operative wound closure   . Acute osteomyelitis of metatarsal bone of right foot ([place])   . Diabetic ulcer of midfoot associated with diabetes mellitus due to underlying condition, with necrosis of bone ([place])   . Osteomyelitis of right foot ([place])   . Vascular calcification   . Cellulitis and abscess of foot, except toes   . Anemia associated with chronic renal failure 07/13/2018  . Cardiomyopathy, dilated, nonischemic ([place]) 07/13/2018  . Diabetic foot infection ([place]) 07/13/2018  . PAD (peripheral artery disease) ([place])   . Macular degeneration   . Ventricular tachycardia ([place]) 06/24/2017  . Embolism due to vascular prosthetic devices, implants and grafts, initial encounter ([place]) 02/04/2016  . Dependence on renal dialysis ([place]) 05/30/2015  . Shortness of breath 02/05/2015  . Chronic systolic heart failure ([place]) 08/22/2014  . Diabetic infection of right foot ([place]) 07/13/2014  . Type  2 diabetes mellitus with diabetic peripheral angiopathy without gangrene ([place]) 03/18/2014  . Anxiety disorder, unspecified 02/23/2014  . Fever, unspecified 02/23/2014  . Headache 02/23/2014  . Pain, unspecified 02/23/2014  . Pruritus, unspecified 02/23/2014  . Mild protein-calorie malnutrition ([place]) 12/26/2013  . ESRD (end stage renal disease) on dialysis ([place]) 11/11/2013  . Snoring 11/11/2013  . Unspecified sleep apnea 11/11/2013  . Other pancytopenia ([place]) 09/22/2013  . Hemoptysis 09/20/2013  . Personal history of nicotine dependence 07/12/2013  . Coagulation defect, unspecified ([place]) 05/16/2013  . Iron deficiency anemia, unspecified 05/06/2013  . Other disorders of plasma-protein metabolism, not elsewhere classified 04/15/2013  . 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 chronic kidney disease or end stage renal disease ([place]) 03/18/2013  .  Other cardiomyopathies ([place]) 03/18/2013  . Secondary hyperparathyroidism of renal origin ([place]) 03/18/2013  . Type 2 diabetes mellitus without complications ([place]) 40/03/6760  . Pre-transplant evaluation for kidney transplant 01/11/2013  . Automatic implantable cardioverter-defibrillator in situ 10/01/2010  . HYPERCHOLESTEROLEMIA, MIXED 05/03/2010  . Essential hypertension 05/03/2010  . CHF 05/03/2010    Jamey Reas, PT, DPT 01/15/2021, 4:08 PM  [facility] [address] [place], [place], [address] Phone: [phone]   Fax:  [phone]  Name: Timothy Faulkner MRN: [id number] Date of Birth: 24-Apr-1946

## 2021-01-16 DIAGNOSIS — N186 End stage renal disease: Secondary | ICD-10-CM | POA: Diagnosis not present

## 2021-01-16 DIAGNOSIS — N2581 Secondary hyperparathyroidism of renal origin: Secondary | ICD-10-CM | POA: Diagnosis not present

## 2021-01-16 DIAGNOSIS — D689 Coagulation defect, unspecified: Secondary | ICD-10-CM | POA: Diagnosis not present

## 2021-01-16 DIAGNOSIS — Z992 Dependence on renal dialysis: Secondary | ICD-10-CM | POA: Diagnosis not present

## 2021-01-17 ENCOUNTER — Ambulatory Visit (INDEPENDENT_AMBULATORY_CARE_PROVIDER_SITE_OTHER): Payer: Medicare Other | Admitting: Physical Therapy

## 2021-01-17 ENCOUNTER — Other Ambulatory Visit: Payer: Self-pay

## 2021-01-17 DIAGNOSIS — R2681 Unsteadiness on feet: Secondary | ICD-10-CM | POA: Diagnosis not present

## 2021-01-17 DIAGNOSIS — R2689 Other abnormalities of gait and mobility: Secondary | ICD-10-CM | POA: Diagnosis not present

## 2021-01-17 DIAGNOSIS — M6281 Muscle weakness (generalized): Secondary | ICD-10-CM | POA: Diagnosis not present

## 2021-01-17 DIAGNOSIS — R293 Abnormal posture: Secondary | ICD-10-CM

## 2021-01-17 DIAGNOSIS — M25661 Stiffness of right knee, not elsewhere classified: Secondary | ICD-10-CM

## 2021-01-17 NOTE — Therapy (Signed)
[facility] Physical Therapy [address] [place], [place], [address] Phone: [phone]   Fax:  [phone]  Physical Therapy Treatment/Progress note Progress Note reporting period date 11/06/20 to 01/17/21   See below for objective and subjective measurements relating to patients progress with PT.   Patient Details  Name: Timothy Faulkner MRN: [id number] Date of Birth: July 26, 1946 Referring Provider (PT): Monica Martinez, MD   Encounter Date: 01/17/2021   PT End of Session - 01/17/21 1511     Visit Number 20    Number of Visits 73    Date for PT Re-Evaluation 12/24/20    Authorization Type UHC Medicare    Progress Note Due on Visit 14    PT Start Time 1430    PT Stop Time 1515    PT Time Calculation (min) 45 min    Equipment Utilized During Treatment Gait belt    Activity Tolerance Patient tolerated treatment well;Patient limited by fatigue    Behavior During Therapy WFL for tasks assessed/performed             Past Medical History:  Diagnosis Date   Anemia    Arthritis    Gout   Automatic implantable cardioverter-defibrillator in situ    [employer]   CHF    ESRD on dialysis [facility])    [name] 03/11/2013 (03/11/2013) dialysis M/W/F   Gangrene ([place])    left foot   GERD (gastroesophageal reflux disease)    Gout    "once a year"   Heart murmur    HYPERCHOLESTEROLEMIA, MIXED    HYPERTENSION    Macular degeneration    Osteomyelitis ([place])    right foot   Other primary cardiomyopathies 07/16/2011   Pacemaker    Peripheral arterial disease (HCC)    left fifth toe ulcer, healing   Pneumonia    Shortness of breath    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 ([place])    left fifth toe   Wears dentures    Wears glasses     Past Surgical History:  Procedure Laterality Date   A/V FISTULAGRAM Left 07/20/2018   Procedure: A/V FISTULAGRAM;  Surgeon: [name], MD;  Location: [place] [facility];  Service: Cardiovascular;   Laterality: Left;   A/V FISTULAGRAM N/A 07/15/2019   Procedure: A/V FISTULAGRAM - Left Arm;  Surgeon: Elam Dutch, MD;  Location: [place] [facility];  Service: Cardiovascular;  Laterality: N/A;   ABDOMINAL AORTOGRAM N/A 02/25/2019   Procedure: ABDOMINAL AORTOGRAM;  Surgeon: Elam Dutch, MD;  Location: [place] [facility];  Service: Cardiovascular;  Laterality: N/A;   ABDOMINAL AORTOGRAM W/LOWER EXTREMITY Bilateral 07/20/2018   Procedure: ABDOMINAL AORTOGRAM W/LOWER EXTREMITY;  Surgeon: [name], MD;  Location: [place] [facility];  Service: Cardiovascular;  Laterality: Bilateral;   AMPUTATION Right 09/07/2018   Procedure: Right fifth metatarsectomy;  Surgeon: [name], DPM;  Location: [place];  Service: Podiatry;  Laterality: Right;   AMPUTATION Left 03/08/2019   Procedure: AMPUTATION  3RD AND 4TH TOES LEFT FOOT;  Surgeon: [name], DPM;  Location: [place];  Service: Podiatry;  Laterality: Left;   AMPUTATION Left 03/15/2019   Procedure: AMPUTATION BELOW KNEE;  Surgeon: Elam Dutch, MD;  Location: [facility];  Service: Vascular;  Laterality: Left;   AV FISTULA PLACEMENT Right 12/13/2012   Procedure: ARTERIOVENOUS (AV) FISTULA CREATION;  Surgeon: [name], MD;  Location: [place];  Service: Vascular;  Laterality: Right;  Ultrasound guided  AV FISTULA PLACEMENT Left 05/07/2016   Procedure: LEFT RADIOCEPHALIC ARTERIOVENOUS (AV) FISTULA CREATION;  Surgeon: [name], MD;  Location: [facility] OR;  Service: Vascular;  Laterality: Left;   AV FISTULA PLACEMENT Right 08/09/2019   Procedure: INSERTION OF RIGHT ARTERIOVENOUS (AV) 4-25m GORE-TEX STRETCH GRAFT RIGHT  ARM;  Surgeon: FElam Dutch MD;  [place]  Service: Vascular;  Laterality: Right;   BOliverRight 03/26/2016   Procedure: RIGHT BASILIC VEIN TRANSPOSITION;  Surgeon: TRosetta Posner MD;  [place]  Service: Vascular;  Laterality: Right;   CARDIAC CATHETERIZATION     CARDIAC DEFIBRILLATOR  PLACEMENT     [employer]   CATARACT EXTRACTION W/PHACO Bilateral    EYE SURGERY Bilateral    Cataract   FISTULOGRAM Left 04/22/2018   Procedure: FISTULOGRAM UPPER EXTREMITY;  Surgeon: CMarty Heck MD;  [place]  Service: Vascular;  Laterality: Left;   I & D EXTREMITY Right 07/15/2018   Procedure: IRRIGATION AND DEBRIDEMENT RIGHT FOOT;  Surgeon: PEvelina Bucy DPM;  [place]  Service: Podiatry;  Laterality: Right;   INCISION AND DRAINAGE ABSCESS / HEMATOMA OF BURSA / KNEE / THIGH Left 2012   "knee" (03/11/2013)   INSERT / REPLACE / REMOVE PACEMAKER     INSERTION OF DIALYSIS CATHETER Left 04/22/2018   Procedure: INSERTION OF DIALYSIS CATHETER;  Surgeon: CMarty Heck MD;  [place]  Service: Vascular;  Laterality: Left;   IR FLUORO GUIDE CV LINE LEFT  07/15/2018   IR PTA VENOUS EXCEPT DIALYSIS CIRCUIT  07/15/2018   LOWER EXTREMITY ANGIOGRAPHY Right 07/21/2018   Procedure: LOWER EXTREMITY ANGIOGRAPHY;  Surgeon: CMarty Heck MD;  [place] [facility];  Service: Cardiovascular;  Laterality: Right;   LOWER EXTREMITY ANGIOGRAPHY Bilateral 02/25/2019   Procedure: Lower Extremity Angiography;  Surgeon: FElam Dutch MD;  [place] [facility];  Service: Cardiovascular;  Laterality: Bilateral;   METATARSAL HEAD EXCISION Right 07/15/2018   Procedure: METATARSAL RESECTION;  Surgeon: PEvelina Bucy DPM;  [place]  Service: Podiatry;  Laterality: Right;   MULTIPLE TOOTH EXTRACTIONS     PERIPHERAL VASCULAR ATHERECTOMY Right 02/28/2019   Procedure: PERIPHERAL VASCULAR ATHERECTOMY;  Surgeon: CWaynetta Sandy MD;  [place] [facility];  Service: Cardiovascular;  Laterality: Right;  right tp trunk    PERIPHERAL VASCULAR BALLOON ANGIOPLASTY Left 02/25/2019   Procedure: PERIPHERAL VASCULAR BALLOON ANGIOPLASTY;  Surgeon: FElam Dutch MD;  [place] [facility] [facility] [facility];  Service: Cardiovascular;  Laterality:  Left;  tibial peroneal trunk and PT   PERIPHERAL VASCULAR INTERVENTION Right 07/21/2018   Procedure: PERIPHERAL VASCULAR INTERVENTION;  Surgeon: CMarty Heck MD;  [place] [facility];  Service: Cardiovascular;  Laterality: Right;  peroneal stents   REVISON OF ARTERIOVENOUS FISTULA Right 35/59/7416  Procedure: Plication OF Right Arm ARTERIOVENOUS FISTULA;  Surgeon: CElam Dutch MD;  [place]  Service: Vascular;  Laterality: Right;   REVISON OF ARTERIOVENOUS FISTULA Left 04/22/2018   Procedure: REVISION OF RADIOCEPHALIC ARTERIOVENOUS FISTULA;  Surgeon: CMarty Heck MD;  [place]  Service: Vascular;  Laterality: Left;   SHUNTOGRAM N/A 05/31/2013   Procedure: SEarney Mallet  Surgeon: VSerafina Mitchell MD;  [facility]  Service: Cardiovascular;  Laterality: N/A;   UPPER EXTREMITY VENOGRAPHY Right 07/23/2018   Procedure: UPPER EXTREMITY VENOGRAPHY;  Surgeon: DAngelia Mould MD;  [place] [facility];  Service: Cardiovascular;  Laterality:  Right;   UPPER EXTREMITY VENOGRAPHY  07/15/2019   Procedure: UPPER EXTREMITY VENOGRAPHY;  Surgeon: Elam Dutch, MD;  Location: [place] [facility];  Service: Cardiovascular;;  rt arm    WOUND DEBRIDEMENT Right 07/17/2018   Procedure: Wound Debridement; Closure Filleted toe flap Right Foot;  Surgeon: [name], DPM;  Location: [place];  Service: Podiatry;  Laterality: Right;   WOUND DEBRIDEMENT Right 09/07/2018   Procedure: Debridement Right Foot Wound, application of wound vac;  Surgeon: [name], DPM;  Location: [place];  Service: Podiatry;  Laterality: Right;   WOUND DEBRIDEMENT Right 03/08/2019   Procedure: DEBRIDEMENT WOUND RIGHT FOOT;  Surgeon: [name], DPM;  Location: [place];  Service: Podiatry;  Laterality: Right;    There were no vitals filed for this visit.   Subjective Assessment - 01/17/21 1457     Subjective He denies any significant pain today, says he was  worked hard last time.    Pertinent History PAD, DM2, arthritis, gout, CHF, implanted cardioverter-defib, HTN, CKD w/ ESRD    Patient Stated Goals to use prosthesis to walk in community,                               [facility] Adult PT Treatment/Exercise - 01/17/21 0001       Transfers   Transfers Sit to Stand;Stand to Sit    Sit to Stand 5: Supervision;With upper extremity assist;With armrests;From chair/3-in-1;Other (comment)    Sit to Stand Details Verbal cues for sequencing;Verbal cues for technique;Visual cues/gestures for sequencing    Stand to Sit 5: Supervision;With upper extremity assist;With armrests;To chair/3-in-1;Other (comment)      Ambulation/Gait   Ambulation/Gait Yes    Ambulation/Gait Assistance 5: Supervision    Ambulation Distance (Feet) 197 Feet   then 50, then 75   Assistive device Rolling walker;Prosthesis    Gait Pattern Step-to pattern;Decreased step length - right;Decreased stance time - left;Decreased stride length;Decreased hip/knee flexion - left;Decreased weight shift to left;Left hip hike;Trunk flexed;Abducted - left;Poor foot clearance - right    Ramp 4: Min assist   min guard descend & minA ascend, RW & prosthesis   Ramp Details (indicate cue type and reason) demo, verbal & tactile cues on technique    Curb 4: Min assist   min guard, RW & prosthesis   Curb Details (indicate cue type and reason) demo, verbal & tactile cues on technique      Knee/Hip Exercises: Stretches   Active Hamstring Stretch Both;2 reps;30 seconds    Active Hamstring Stretch Limitations supine SLR with strap bw sets on leg press      Knee/Hip Exercises: Aerobic   Nustep seat 12 Level 5 for 8 min      Knee/Hip Exercises: Machines for Strengthening   Cybex Leg Press BLEs 100# 3sets X10reps with back flat, then 62# with alt leg marches for for TKE stability in stance phase of gait X 10 bilat      Knee/Hip Exercises: Standing   Forward Step Up Both;5 reps;Hand  Hold: 2;Step Height: 6"      Prosthetics   Current prosthetic wear tolerance (days/week)  daily including dialysis    Current prosthetic wear tolerance (#hours/day)  ~90% of awake hours    Residual limb condition  No open areas, dry skin, normal temperature, normal color, cylinerical shape  PT Short Term Goals - 01/17/21 1522       PT SHORT TERM GOAL #1   Title Patient verbalizes proper adjustment of ply socks.    Baseline MEt 01/17/21    Time 4    Period Weeks    Status Achieved    Target Date 01/17/21      PT SHORT TERM GOAL #2   Title Patient verbalize prosthesis wear within 27mn of arising in mornings.    Time 4    Period Weeks    Status Achieved    Target Date 01/17/21      PT SHORT TERM GOAL #3   Title Patient standing balance with RW & prosthesis reaching 10" anteriorly & within 5" of floor with supervision.    Time 4    Period Weeks    Status On-going    Target Date 01/17/21      PT SHORT TERM GOAL #4   Title Patient ambulates 159 with RW & prosthesis with supervision.    Time 4    Period Weeks    Status Achieved    Target Date 01/17/21      PT SHORT TERM GOAL #5   Title Patient negotiates ramps & curbs with RW & prostheis with modA    Time 4    Period Weeks    Status Achieved    Target Date 01/17/21               PT Long Term Goals - 12/18/20 1658       PT LONG TERM GOAL #1   Title Patient & family verbalize & demonstrate understanding of proper prosthetic care to enable utilization of prosthesis.    Time 12    Period Weeks    Status On-going    Target Date 03/14/21      PT LONG TERM GOAL #2   Title Patient tolerates wear of prosthesis >90% of awake hours without skin or limb discomfort issues.    Time 12    Period Weeks    Status On-going    Target Date 03/14/21      PT LONG TERM GOAL #3   Title Tasks of BOceanographerwith RW  >/= 45/56    Time 12    Period Weeks    Status Revised    Target Date  03/14/21      PT LONG TERM GOAL #4   Title Patient ambulates 250' with RW & prosthesis modified independent.    Time 12    Period Weeks    Status Revised    Target Date 03/14/21      PT LONG TERM GOAL #5   Title Patient negotiates ramps & curbs with RW & prosthesis with supervision    Time 12    Period Weeks    Status Revised    Target Date 03/14/21                   Plan - 01/17/21 1521     Clinical Impression Statement Progress note reflects good overall progress and has met 4/5 PT goals and is close to meeting the other. He relays he feels 60% back to normal since beginning PT. He will continue to benefit from skilled PT.    Personal Factors and Comorbidities Comorbidity 3+;Fitness;Time since onset of injury/illness/exacerbation    Comorbidities PAD, DM2, arthritis, gout, CHF, implanted cardioverter-defib, HTN, CKD w/ ESRD, blind left knee    Examination-Activity Limitations Locomotion Level;Stairs;Stand;Transfers    Examination-Participation  Restrictions Community Activity    Stability/Clinical Decision Making Evolving/Moderate complexity    Rehab Potential Good    PT Frequency 2x / week    PT Duration 12 weeks    PT Treatment/Interventions ADLs/Self Care Home Management;DME Instruction;Gait training;Stair training;Functional mobility training;Therapeutic activities;Therapeutic exercise;Balance training;Neuromuscular re-education;Patient/family education;Prosthetic Training;Manual techniques;Vestibular    PT Next Visit Plan progress as tolerated prosthetic gait with RW including ramps & curbs,  exercise for strength, balance, flexibility & endurance.    Consulted and Agree with Plan of Care Patient;Family member/caregiver    Family Member Consulted daughter,Sherry Cira Servant             Patient will benefit from skilled therapeutic intervention in order to improve the following deficits and impairments:  Abnormal gait, Cardiopulmonary status limiting activity,  Decreased activity tolerance, Decreased balance, Decreased endurance, Decreased knowledge of use of DME, Decreased mobility, Decreased range of motion, Decreased scar mobility, Decreased strength, Increased edema, Postural dysfunction, Prosthetic Dependency  Visit Diagnosis: Other abnormalities of gait and mobility  Muscle weakness (generalized)  Stiffness of right knee, not elsewhere classified  Unsteadiness on feet  Abnormal posture     Problem List Patient Active Problem List   Diagnosis Date Noted   Polycythemia, secondary 02/21/2020   Stable treated proliferative diabetic retinopathy of right eye determined by examination associated with type 2 diabetes mellitus ([place]) 11/29/2019   Stable treated proliferative diabetic retinopathy of left eye without macular edema determined by examination associated with type 2 diabetes mellitus ([place]) 11/29/2019   Advanced nonexudative age-related macular degeneration of both eyes with subfoveal involvement 11/29/2019   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   Sepsis ([place]) 04/14/2019   Hypocalcemia 04/08/2019   Drug induced constipation    Anemia of chronic disease    ESRD on dialysis ([place])    Postoperative pain    Phantom limb pain (HCC)    S/P BKA (below knee amputation), left ([place]) 03/18/2019   Unilateral complete BKA, left, initial encounter ([place]) 03/17/2019   Critical lower limb ischemia ([place]) 03/12/2019   Infection of amputation stump, right lower extremity ([place]) 03/10/2019   Hypotension 02/23/2019   Cellulitis of left foot 02/23/2019   Gangrene of toe of left foot ([place]) 35/46/5681   Complication of vascular dialysis catheter 11/24/2018   Chronic osteomyelitis involving right ankle and foot ([place])    Ulcer of right foot with fat layer exposed ([place]) 09/02/2018   Encounter for planned post-operative wound closure    Acute osteomyelitis of metatarsal bone of right foot ([place])    Diabetic ulcer of midfoot associated with  diabetes mellitus due to underlying condition, with necrosis of bone ([place])    Osteomyelitis of right foot ([place])    Vascular calcification    Cellulitis and abscess of foot, except toes    Anemia associated with chronic renal failure 07/13/2018   Cardiomyopathy, dilated, nonischemic ([place]) 07/13/2018   Diabetic foot infection ([place]) 07/13/2018   PAD (peripheral artery disease) ([place])    Macular degeneration    Ventricular tachycardia ([place]) 06/24/2017   Embolism due to vascular prosthetic devices, implants and grafts, initial encounter ([place]) 02/04/2016   Dependence on renal dialysis ([place]) 05/30/2015   Shortness of breath 27/51/7001   Chronic systolic heart failure ([place]) 08/22/2014   Diabetic infection of right foot ([place]) 07/13/2014   Type 2 diabetes mellitus with diabetic peripheral angiopathy without gangrene ([place]) 03/18/2014   Anxiety disorder, unspecified 02/23/2014   Fever, unspecified 02/23/2014   Headache 02/23/2014   Pain, unspecified 02/23/2014  Pruritus, unspecified 02/23/2014   Mild protein-calorie malnutrition ([place]) 12/26/2013   ESRD (end stage renal disease) on dialysis ([place]) 11/11/2013   Snoring 11/11/2013   Unspecified sleep apnea 11/11/2013   Other pancytopenia ([place]) 09/22/2013   Hemoptysis 09/20/2013   Personal history of nicotine dependence 07/12/2013   Coagulation defect, unspecified ([place]) 05/16/2013   Iron deficiency anemia, unspecified 05/06/2013   Other disorders of plasma-protein metabolism, not elsewhere classified 04/15/2013   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 chronic kidney disease or end stage renal disease ([place]) 03/18/2013   Other cardiomyopathies ([place]) 03/18/2013   Secondary hyperparathyroidism of renal origin ([place]) 03/18/2013   Type 2 diabetes mellitus without complications ([place]) 27/10/5007   Pre-transplant evaluation for kidney transplant 01/11/2013   Automatic implantable cardioverter-defibrillator in situ 10/01/2010    HYPERCHOLESTEROLEMIA, MIXED 05/03/2010   Essential hypertension 05/03/2010   CHF 05/03/2010    Silvestre Mesi 01/17/2021, 3:23 PM  [facility] Physical Therapy [address] [place], [place], [address] Phone: [phone]   Fax:  [phone]  Name: RUSSELL QUINNEY MRN: [id number] Date of Birth: [birth year]

## 2021-01-18 DIAGNOSIS — N2581 Secondary hyperparathyroidism of renal origin: Secondary | ICD-10-CM | POA: Diagnosis not present

## 2021-01-18 DIAGNOSIS — D689 Coagulation defect, unspecified: Secondary | ICD-10-CM | POA: Diagnosis not present

## 2021-01-18 DIAGNOSIS — N186 End stage renal disease: Secondary | ICD-10-CM | POA: Diagnosis not present

## 2021-01-18 DIAGNOSIS — Z992 Dependence on renal dialysis: Secondary | ICD-10-CM | POA: Diagnosis not present

## 2021-01-21 ENCOUNTER — Ambulatory Visit (INDEPENDENT_AMBULATORY_CARE_PROVIDER_SITE_OTHER): Payer: Medicare Other

## 2021-01-21 DIAGNOSIS — D689 Coagulation defect, unspecified: Secondary | ICD-10-CM | POA: Diagnosis not present

## 2021-01-21 DIAGNOSIS — I420 Dilated cardiomyopathy: Secondary | ICD-10-CM

## 2021-01-21 DIAGNOSIS — Z992 Dependence on renal dialysis: Secondary | ICD-10-CM | POA: Diagnosis not present

## 2021-01-21 DIAGNOSIS — N2581 Secondary hyperparathyroidism of renal origin: Secondary | ICD-10-CM | POA: Diagnosis not present

## 2021-01-21 DIAGNOSIS — E119 Type 2 diabetes mellitus without complications: Secondary | ICD-10-CM | POA: Diagnosis not present

## 2021-01-21 DIAGNOSIS — N186 End stage renal disease: Secondary | ICD-10-CM | POA: Diagnosis not present

## 2021-01-22 ENCOUNTER — Other Ambulatory Visit: Payer: Self-pay

## 2021-01-22 ENCOUNTER — Encounter: Payer: Self-pay | Admitting: Physical Therapy

## 2021-01-22 ENCOUNTER — Ambulatory Visit (INDEPENDENT_AMBULATORY_CARE_PROVIDER_SITE_OTHER): Payer: Medicare Other | Admitting: Physical Therapy

## 2021-01-22 DIAGNOSIS — R293 Abnormal posture: Secondary | ICD-10-CM | POA: Diagnosis not present

## 2021-01-22 DIAGNOSIS — M25661 Stiffness of right knee, not elsewhere classified: Secondary | ICD-10-CM | POA: Diagnosis not present

## 2021-01-22 DIAGNOSIS — R2689 Other abnormalities of gait and mobility: Secondary | ICD-10-CM | POA: Diagnosis not present

## 2021-01-22 DIAGNOSIS — M6281 Muscle weakness (generalized): Secondary | ICD-10-CM | POA: Diagnosis not present

## 2021-01-22 DIAGNOSIS — R2681 Unsteadiness on feet: Secondary | ICD-10-CM

## 2021-01-22 NOTE — Therapy (Signed)
[facility] Physical Therapy [address] [place], [place], [address] Phone: [phone]   Fax:  [phone]  Physical Therapy Treatment  Patient Details  Name: Timothy Faulkner MRN: [id number] Date of Birth: 1945-10-24 Referring Provider (PT): Monica Martinez, MD   Encounter Date: 01/22/2021   PT End of Session - 01/22/21 1427     Visit Number 21    Number of Visits 11    Date for PT Re-Evaluation 12/24/20    Authorization Type UHC Medicare    Progress Note Due on Visit 30    PT Start Time 1430    PT Stop Time 1516    PT Time Calculation (min) 46 min    Equipment Utilized During Treatment Gait belt    Activity Tolerance Patient tolerated treatment well;Patient limited by fatigue    Behavior During Therapy WFL for tasks assessed/performed             Past Medical History:  Diagnosis Date   Anemia    Arthritis    Gout   Automatic implantable cardioverter-defibrillator in situ    [employer]   CHF    ESRD on dialysis [facility])    [name] 03/11/2013 (03/11/2013) dialysis M/W/F   Gangrene ([place])    left foot   GERD (gastroesophageal reflux disease)    Gout    "once a year"   Heart murmur    HYPERCHOLESTEROLEMIA, MIXED    HYPERTENSION    Macular degeneration    Osteomyelitis ([place])    right foot   Other primary cardiomyopathies 07/16/2011   Pacemaker    Peripheral arterial disease (HCC)    left fifth toe ulcer, healing   Pneumonia    Shortness of breath    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 ([place])    left fifth toe   Wears dentures    Wears glasses     Past Surgical History:  Procedure Laterality Date   A/V FISTULAGRAM Left 07/20/2018   Procedure: A/V FISTULAGRAM;  Surgeon: [name], MD;  Location: [place] CV [facility];  Service: Cardiovascular;  Laterality: Left;   A/V FISTULAGRAM N/A 07/15/2019   Procedure: A/V FISTULAGRAM - Left Arm;  Surgeon: Elam Dutch, MD;  Location: [place] [facility];  Service:  Cardiovascular;  Laterality: N/A;   ABDOMINAL AORTOGRAM N/A 02/25/2019   Procedure: ABDOMINAL AORTOGRAM;  Surgeon: Elam Dutch, MD;  Location: [place] [facility];  Service: Cardiovascular;  Laterality: N/A;   ABDOMINAL AORTOGRAM W/LOWER EXTREMITY Bilateral 07/20/2018   Procedure: ABDOMINAL AORTOGRAM W/LOWER EXTREMITY;  Surgeon: [name], MD;  Location: [place] CV LAB;  Service: Cardiovascular;  Laterality: Bilateral;   AMPUTATION Right 09/07/2018   Procedure: Right fifth metatarsectomy;  Surgeon: [name], DPM;  Location: [place];  Service: Podiatry;  Laterality: Right;   AMPUTATION Left 03/08/2019   Procedure: AMPUTATION  3RD AND 4TH TOES LEFT FOOT;  Surgeon: [name], DPM;  Location: [place];  Service: Podiatry;  Laterality: Left;   AMPUTATION Left 03/15/2019   Procedure: AMPUTATION BELOW KNEE;  Surgeon: Elam Dutch, MD;  Location: [facility] OR;  Service: Vascular;  Laterality: Left;   AV FISTULA PLACEMENT Right 12/13/2012   Procedure: ARTERIOVENOUS (AV) FISTULA CREATION;  Surgeon: [name], MD;  Location: [place];  Service: Vascular;  Laterality: Right;  Ultrasound guided   AV FISTULA PLACEMENT Left 05/07/2016   Procedure: LEFT RADIOCEPHALIC ARTERIOVENOUS (AV) FISTULA CREATION;  Surgeon: [name], MD;  Location: [facility]  OR;  Service: Vascular;  Laterality: Left;   AV FISTULA PLACEMENT Right 08/09/2019   Procedure: INSERTION OF RIGHT ARTERIOVENOUS (AV) 4-59m GORE-TEX STRETCH GRAFT RIGHT  ARM;  Surgeon: FElam Dutch MD;  [place]  Service: Vascular;  Laterality: Right;   [place] 03/[place]/2016   Procedure: RIGHT BASILIC VEIN TRANSPOSITION;  Surgeon: TRosetta Posner MD;  [place]  Service: Vascular;  Laterality: Right;   CARDIAC CATHETERIZATION     CARDIAC DEFIBRILLATOR PLACEMENT     [employer]   CATARACT EXTRACTION W/PHACO Bilateral    EYE SURGERY Bilateral    Cataract   FISTULOGRAM Left 04/22/2018   Procedure:  FISTULOGRAM UPPER EXTREMITY;  Surgeon: CMarty Heck MD;  [place]  Service: Vascular;  Laterality: Left;   I & D EXTREMITY Right 07/15/2018   Procedure: IRRIGATION AND DEBRIDEMENT RIGHT FOOT;  Surgeon: PEvelina Bucy DPM;  [place]  Service: Podiatry;  Laterality: Right;   INCISION AND DRAINAGE ABSCESS / HEMATOMA OF BURSA / KNEE / THIGH Left 2012   "knee" (03/11/2013)   INSERT / REPLACE / REMOVE PACEMAKER     INSERTION OF DIALYSIS CATHETER Left 04/22/2018   Procedure: INSERTION OF DIALYSIS CATHETER;  Surgeon: CMarty Heck MD;  [place]  Service: Vascular;  Laterality: Left;   IR FLUORO GUIDE CV LINE LEFT  07/15/2018   IR PTA VENOUS EXCEPT DIALYSIS CIRCUIT  07/15/2018   LOWER EXTREMITY ANGIOGRAPHY Right 07/21/2018   Procedure: LOWER EXTREMITY ANGIOGRAPHY;  Surgeon: CMarty Heck MD;  [place];  Service: Cardiovascular;  Laterality: Right;   LOWER EXTREMITY ANGIOGRAPHY Bilateral 02/25/2019   Procedure: Lower Extremity Angiography;  Surgeon: FElam Dutch MD;  [place];  Service: Cardiovascular;  Laterality: Bilateral;   METATARSAL HEAD EXCISION Right 07/15/2018   Procedure: METATARSAL RESECTION;  Surgeon: PEvelina Bucy DPM;  [place]  Service: Podiatry;  Laterality: Right;   MULTIPLE TOOTH EXTRACTIONS     PERIPHERAL VASCULAR ATHERECTOMY Right 02/28/2019   Procedure: PERIPHERAL VASCULAR ATHERECTOMY;  Surgeon: CWaynetta Sandy MD;  [place];  Service: Cardiovascular;  Laterality: Right;  right tp trunk    PERIPHERAL VASCULAR BALLOON ANGIOPLASTY Left 02/25/2019   Procedure: PERIPHERAL VASCULAR BALLOON ANGIOPLASTY;  Surgeon: FElam Dutch MD;  [place];  Service: Cardiovascular;  Laterality: Left;  tibial peroneal trunk and PT   PERIPHERAL VASCULAR INTERVENTION Right 07/21/2018   Procedure: PERIPHERAL VASCULAR INTERVENTION;  Surgeon: CMarty Heck MD;  [place];  Service: Cardiovascular;  Laterality: Right;  peroneal stents   REVISON OF ARTERIOVENOUS FISTULA Right 34/31/5400  Procedure: Plication OF Right Arm ARTERIOVENOUS FISTULA;  Surgeon: CElam Dutch MD;  [place]  Service: Vascular;  Laterality: Right;   REVISON OF ARTERIOVENOUS FISTULA Left 04/22/2018   Procedure: REVISION OF RADIOCEPHALIC ARTERIOVENOUS FISTULA;  Surgeon: CMarty Heck MD;  [place]  Service: Vascular;  Laterality: Left;   SHUNTOGRAM N/A 05/31/2013   Procedure: SEarney Mallet  Surgeon: VSerafina Mitchell MD;  [facility];  Service: Cardiovascular;  Laterality: N/A;   UPPER EXTREMITY VENOGRAPHY Right 07/23/2018   Procedure: UPPER EXTREMITY VENOGRAPHY;  Surgeon: DAngelia Mould MD;  [place] [facility];  Service: Cardiovascular;  Laterality: Right;   UPPER EXTREMITY VENOGRAPHY  07/15/2019   Procedure: UPPER EXTREMITY VENOGRAPHY;  Surgeon: FElam Dutch MD;  [facility];  Service: Cardiovascular;;  rt arm    WOUND DEBRIDEMENT Right 07/17/2018   Procedure: Wound Debridement; Closure Filleted toe flap Right Foot;  Surgeon: [name], DPM;  Location: [place];  Service: Podiatry;  Laterality: Right;   WOUND DEBRIDEMENT Right 09/07/2018   Procedure: Debridement Right Foot Wound, application of wound vac;  Surgeon: [name], DPM;  Location: [place];  Service: Podiatry;  Laterality: Right;   WOUND DEBRIDEMENT Right 03/08/2019   Procedure: DEBRIDEMENT WOUND RIGHT FOOT;  Surgeon: [name], DPM;  Location: [place];  Service: Podiatry;  Laterality: Right;    There were no vitals filed for this visit.   Subjective Assessment - 01/22/21 1429     Subjective He is wearing prosthesis daily most of awake hours including dialysis. He has been walking some with walker with family.    Pertinent History PAD, DM2, arthritis, gout, CHF, implanted cardioverter-defib, HTN,  CKD w/ ESRD    Patient Stated Goals to use prosthesis to walk in community,    Currently in Pain? No/denies                               [facility] Adult PT Treatment/Exercise - 01/22/21 1428       Transfers   Transfers Sit to Stand;Stand to Sit    Sit to Stand 5: Supervision;With upper extremity assist;With armrests;From chair/3-in-1;Other (comment)    Sit to Stand Details Verbal cues for sequencing;Verbal cues for technique;Visual cues/gestures for sequencing    Stand to Sit 5: Supervision;With upper extremity assist;With armrests;To chair/3-in-1;Other (comment)      Ambulation/Gait   Ambulation/Gait Yes    Ambulation/Gait Assistance 5: Supervision    Ambulation/Gait Assistance Details visual & verbal cues on step width & upright posture.    Ambulation Distance (Feet) 75 Feet   75' x 2   Assistive device Rolling walker;Prosthesis    Gait Pattern --    Ambulation Surface Level;Indoor    Ramp 4: Min assist   min guard descend & minA ascend, RW & prosthesis   Ramp Details (indicate cue type and reason) verbal & tactile cues on technique    Curb 4: Min assist   min guard, RW & prosthesis   Curb Details (indicate cue type and reason) verbal & tactile cues on technique      Neuro Re-ed    Neuro Re-ed Details  standing with PT tactile cues & close supervision: heads turns right/left & up/down 5 reps ea, & eyes closed static 5 sec 2 reps.      Knee/Hip Exercises: Stretches   Active Hamstring Stretch Both;2 reps;30 seconds    Active Hamstring Stretch Limitations supine SLR with strap bw sets on leg press      Knee/Hip Exercises: Aerobic   Nustep seat 12 Level 5 for 8 min      Knee/Hip Exercises: Machines for Strengthening   Cybex Leg Press BLEs 100# 15 reps 2 sets with back flat, then 62# with alt leg marches for for TKE stability in stance phase of gait X 10 bilat      Knee/Hip Exercises: Standing   Forward Step Up Both;5 reps;Hand Hold: 2;Step Height: 6"     Forward Step Up Limitations demo, verbal & tactile cues for technique    Step Down Both;1 set;5 reps;Hand Hold: 2;Step Height: 6"    Step Down Limitations demo, verbal & tactile cues for technique  Knee/Hip Exercises: Seated   Sit to Sand 2 sets;5 reps;without UE support   from 24" bar stool, goal to not touch //bars, PT demo, verbal & tactile cues on technique     Prosthetics   Current prosthetic wear tolerance (days/week)  daily including dialysis    Current prosthetic wear tolerance (#hours/day)  ~90% of awake hours    Residual limb condition  No open areas, dry skin, normal temperature, normal color, cylinerical shape                      PT Short Term Goals - 01/22/21 1511       PT SHORT TERM GOAL #1   Title Patient verbalizes proper adjustment of ply socks.    Baseline MEt 01/17/21    Time 4    Period Weeks    Status Achieved    Target Date 01/17/21      PT SHORT TERM GOAL #2   Title Patient verbalize prosthesis wear within 82mn of arising in mornings.    Time 4    Period Weeks    Status Achieved    Target Date 01/17/21      PT SHORT TERM GOAL #3   Title Patient standing balance with RW & prosthesis reaching 10" anteriorly & within 5" of floor with supervision.    Time 4    Period Weeks    Status Achieved    Target Date 01/17/21      PT SHORT TERM GOAL #4   Title Patient ambulates 137 with RW & prosthesis with supervision.    Time 4    Period Weeks    Status Achieved    Target Date 01/17/21      PT SHORT TERM GOAL #5   Title Patient negotiates ramps & curbs with RW & prostheis with modA    Time 4    Period Weeks    Status Achieved    Target Date 01/17/21              PT Short Term Goals - 01/22/21 1513       PT SHORT TERM GOAL #1   Title Patient verbalizes understanding of signs of sweat & drying limb/liner    Baseline MEt 01/17/21    Time 4    Period Weeks    Status New    Target Date 02/14/21      PT SHORT TERM GOAL #2    Title Patient standing balance scans & reaches 5" without UE support with supervision.    Time 4    Period Weeks    Status Revised    Target Date 02/14/21      PT SHORT TERM GOAL #3   Title Patient sit to/from stand with chairs without armrests to RW with supervision.    Time 4    Period Weeks    Status New    Target Date 02/14/21      PT SHORT TERM GOAL #4   Title Patient ambulates 238 with RW & prosthesis with supervision.    Time 4    Period Weeks    Status Revised    Target Date 02/14/21      PT SHORT TERM GOAL #5   Title Patient negotiates ramps & curbs with RW & prostheis with supervision.    Time 4    Period Weeks    Status Revised    Target Date 02/14/21  PT Long Term Goals - 12/18/20 1658       PT LONG TERM GOAL #1   Title Patient & family verbalize & demonstrate understanding of proper prosthetic care to enable utilization of prosthesis.    Time 12    Period Weeks    Status On-going    Target Date 03/14/21      PT LONG TERM GOAL #2   Title Patient tolerates wear of prosthesis >90% of awake hours without skin or limb discomfort issues.    Time 12    Period Weeks    Status On-going    Target Date 03/14/21      PT LONG TERM GOAL #3   Title Tasks of Oceanographer with RW  >/= 45/56    Time 12    Period Weeks    Status Revised    Target Date 03/14/21      PT LONG TERM GOAL #4   Title Patient ambulates 250' with RW & prosthesis modified independent.    Time 12    Period Weeks    Status Revised    Target Date 03/14/21      PT LONG TERM GOAL #5   Title Patient negotiates ramps & curbs with RW & prosthesis with supervision    Time 12    Period Weeks    Status Revised    Target Date 03/14/21                   Plan - 01/22/21 1427     Clinical Impression Statement Patient required less assistance with gait & standing activities.  He is slowly improving with his mobility.  PT session included functional exercises to  improve upright posture, balance & gait.    Personal Factors and Comorbidities Comorbidity 3+;Fitness;Time since onset of injury/illness/exacerbation    Comorbidities PAD, DM2, arthritis, gout, CHF, implanted cardioverter-defib, HTN, CKD w/ ESRD, blind left knee    Examination-Activity Limitations Locomotion Level;Stairs;Stand;Transfers    Examination-Participation Restrictions Community Activity    Stability/Clinical Decision Making Evolving/Moderate complexity    Rehab Potential Good    PT Frequency 2x / week    PT Duration 12 weeks    PT Treatment/Interventions ADLs/Self Care Home Management;DME Instruction;Gait training;Stair training;Functional mobility training;Therapeutic activities;Therapeutic exercise;Balance training;Neuromuscular re-education;Patient/family education;Prosthetic Training;Manual techniques;Vestibular    PT Next Visit Plan work towards updated STGs, progress as tolerated prosthetic gait with RW including ramps & curbs,  exercise for strength, balance, flexibility & endurance.    Consulted and Agree with Plan of Care Patient;Family member/caregiver    Family Member Consulted daughter,Sherry Cira Servant             Patient will benefit from skilled therapeutic intervention in order to improve the following deficits and impairments:  Abnormal gait, Cardiopulmonary status limiting activity, Decreased activity tolerance, Decreased balance, Decreased endurance, Decreased knowledge of use of DME, Decreased mobility, Decreased range of motion, Decreased scar mobility, Decreased strength, Increased edema, Postural dysfunction, Prosthetic Dependency  Visit Diagnosis: Other abnormalities of gait and mobility  Muscle weakness (generalized)  Stiffness of right knee, not elsewhere classified  Unsteadiness on feet  Abnormal posture     Problem List Patient Active Problem List   Diagnosis Date Noted   Polycythemia, secondary 02/21/2020   Stable treated proliferative  diabetic retinopathy of right eye determined by examination associated with type 2 diabetes mellitus ([place]) 11/29/2019   Stable treated proliferative diabetic retinopathy of left eye without macular edema determined by examination associated with type 2 diabetes mellitus (  [place]) 11/29/2019   Advanced nonexudative age-related macular degeneration of both eyes with subfoveal involvement 11/29/2019   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   Sepsis ([place]) 04/14/2019   Hypocalcemia 04/08/2019   Drug induced constipation    Anemia of chronic disease    ESRD on dialysis [facility])    Postoperative pain    Phantom limb pain (HCC)    S/P BKA (below knee amputation), left ([place]) 03/18/2019   Unilateral complete BKA, left, initial encounter ([place]) 03/17/2019   Critical lower limb ischemia ([place]) 03/12/2019   Infection of amputation stump, right lower extremity ([place]) 03/10/2019   Hypotension 02/23/2019   Cellulitis of left foot 02/23/2019   Gangrene of toe of left foot ([place]) 16/05/9603   Complication of vascular dialysis catheter 11/24/2018   Chronic osteomyelitis involving right ankle and foot ([place])    Ulcer of right foot with fat layer exposed ([place]) 09/02/2018   Encounter for planned post-operative wound closure    Acute osteomyelitis of metatarsal bone of right foot ([place])    Diabetic ulcer of midfoot associated with diabetes mellitus due to underlying condition, with necrosis of bone ([place])    Osteomyelitis of right foot ([place])    Vascular calcification    Cellulitis and abscess of foot, except toes    Anemia associated with chronic renal failure 07/13/2018   Cardiomyopathy, dilated, nonischemic (HCC) 07/13/2018   Diabetic foot infection ([place]) 07/13/2018   PAD (peripheral artery disease) ([place])    Macular degeneration    Ventricular tachycardia ([place]) 06/24/2017   Embolism due to vascular prosthetic devices, implants and grafts, initial encounter ([place]) 02/04/2016   Dependence on renal dialysis  ([place]) 05/30/2015   Shortness of breath 54/04/8118   Chronic systolic heart failure ([place]) 08/22/2014   Diabetic infection of right foot ([place]) 07/13/2014   Type 2 diabetes mellitus with diabetic peripheral angiopathy without gangrene ([place]) 03/18/2014   Anxiety disorder, unspecified 02/23/2014   Fever, unspecified 02/23/2014   Headache 02/23/2014   Pain, unspecified 02/23/2014   Pruritus, unspecified 02/23/2014   Mild protein-calorie malnutrition ([place]) 12/26/2013   ESRD (end stage renal disease) on dialysis ([place]) 11/11/2013   Snoring 11/11/2013   Unspecified sleep apnea 11/11/2013   Other pancytopenia ([place]) 09/22/2013   Hemoptysis 09/20/2013   Personal history of nicotine dependence 07/12/2013   Coagulation defect, unspecified ([place]) 05/16/2013   Iron deficiency anemia, unspecified 05/06/2013   Other disorders of plasma-protein metabolism, not elsewhere classified 04/15/2013   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 chronic kidney disease or end stage renal disease ([place]) 03/18/2013   Other cardiomyopathies ([place]) 03/18/2013   Secondary hyperparathyroidism of renal origin ([place]) 03/18/2013   Type 2 diabetes mellitus without complications ([place]) 14/78/2956   Pre-transplant evaluation for kidney transplant 01/11/2013   Automatic implantable cardioverter-defibrillator in situ 10/01/2010   HYPERCHOLESTEROLEMIA, MIXED 05/03/2010   Essential hypertension 05/03/2010   CHF 05/03/2010    Jamey Reas, PT, DPT 01/22/2021, 3:13 PM  [facility] Physical Therapy [address] [place], [place], [address] Phone: [phone]   Fax:  [phone]  Name: ZAVION SLEIGHT MRN: [id number] Date of Birth: 08/25/1945

## 2021-01-23 DIAGNOSIS — D689 Coagulation defect, unspecified: Secondary | ICD-10-CM | POA: Diagnosis not present

## 2021-01-23 DIAGNOSIS — E119 Type 2 diabetes mellitus without complications: Secondary | ICD-10-CM | POA: Diagnosis not present

## 2021-01-23 DIAGNOSIS — N2581 Secondary hyperparathyroidism of renal origin: Secondary | ICD-10-CM | POA: Diagnosis not present

## 2021-01-23 DIAGNOSIS — Z992 Dependence on renal dialysis: Secondary | ICD-10-CM | POA: Diagnosis not present

## 2021-01-23 DIAGNOSIS — N186 End stage renal disease: Secondary | ICD-10-CM | POA: Diagnosis not present

## 2021-01-23 LAB — CUP PACEART REMOTE DEVICE CHECK
Battery Remaining Longevity: 36 mo
Battery Remaining Percentage: 38 %
Brady Statistic RV Percent Paced: 0 %
Date Time Interrogation Session: 20220613013100
HighPow Impedance: 118 Ohm
Implantable Lead Implant Date: 20111118
Implantable Lead Location: 753860
Implantable Lead Model: 181
Implantable Lead Serial Number: 313758
Implantable Pulse Generator Implant Date: 20111118
Lead Channel Impedance Value: 587 Ohm
Lead Channel Pacing Threshold Amplitude: 0.7 V
Lead Channel Pacing Threshold Pulse Width: 0.5 ms
Lead Channel Setting Pacing Amplitude: 2.4 V
Lead Channel Setting Pacing Pulse Width: 0.5 ms
Lead Channel Setting Sensing Sensitivity: 0.5 mV
Pulse Gen Serial Number: 268731

## 2021-01-24 ENCOUNTER — Ambulatory Visit (INDEPENDENT_AMBULATORY_CARE_PROVIDER_SITE_OTHER): Payer: Medicare Other | Admitting: Physical Therapy

## 2021-01-24 ENCOUNTER — Other Ambulatory Visit: Payer: Self-pay

## 2021-01-24 DIAGNOSIS — R2689 Other abnormalities of gait and mobility: Secondary | ICD-10-CM

## 2021-01-24 DIAGNOSIS — M25661 Stiffness of right knee, not elsewhere classified: Secondary | ICD-10-CM

## 2021-01-24 DIAGNOSIS — R2681 Unsteadiness on feet: Secondary | ICD-10-CM

## 2021-01-24 DIAGNOSIS — R293 Abnormal posture: Secondary | ICD-10-CM

## 2021-01-24 DIAGNOSIS — M6281 Muscle weakness (generalized): Secondary | ICD-10-CM | POA: Diagnosis not present

## 2021-01-24 NOTE — Therapy (Signed)
[facility] Physical Therapy [address] [place], [place], [address] Phone: [phone]   Fax:  [phone]  Physical Therapy Treatment  Patient Details  Name: Timothy Faulkner MRN: [id number] Date of Birth: 07/24/1946 Referring Provider (PT): Monica Martinez, MD   Encounter Date: 01/24/2021   PT End of Session - 01/24/21 1502     Visit Number 22    Number of Visits 48    Date for PT Re-Evaluation 12/24/20    Authorization Type [employer] Medicare    Progress Note Due on Visit 73    PT Start Time 1430    PT Stop Time 1515    PT Time Calculation (min) 45 min    Equipment Utilized During Treatment Gait belt    Activity Tolerance Patient tolerated treatment well;Patient limited by fatigue    Behavior During Therapy WFL for tasks assessed/performed             Past Medical History:  Diagnosis Date   Anemia    Arthritis    Gout   Automatic implantable cardioverter-defibrillator in situ    [employer]   CHF    ESRD on dialysis [facility])    [name] 03/11/2013 (03/11/2013) dialysis M/W/F   Gangrene ([place])    left foot   GERD (gastroesophageal reflux disease)    Gout    "once a year"   Heart murmur    HYPERCHOLESTEROLEMIA, MIXED    HYPERTENSION    Macular degeneration    Osteomyelitis ([place])    right foot   Other primary cardiomyopathies 07/16/2011   Pacemaker    Peripheral arterial disease (HCC)    left fifth toe ulcer, healing   Pneumonia    Shortness of breath    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 ([place])    left fifth toe   Wears dentures    Wears glasses     Past Surgical History:  Procedure Laterality Date   A/V FISTULAGRAM Left 07/20/2018   Procedure: A/V FISTULAGRAM;  Surgeon: [name], MD;  Location: [place] CV LAB;  Service: Cardiovascular;  Laterality: Left;   A/V FISTULAGRAM N/A 07/15/2019   Procedure: A/V FISTULAGRAM - Left Arm;  Surgeon: Elam Dutch, MD;  Location: [place] CV LAB;  Service:  Cardiovascular;  Laterality: N/A;   ABDOMINAL AORTOGRAM N/A 02/25/2019   Procedure: ABDOMINAL AORTOGRAM;  Surgeon: Elam Dutch, MD;  Location: [place] CV LAB;  Service: Cardiovascular;  Laterality: N/A;   ABDOMINAL AORTOGRAM W/LOWER EXTREMITY Bilateral 07/20/2018   Procedure: ABDOMINAL AORTOGRAM W/LOWER EXTREMITY;  Surgeon: [name], MD;  Location: [place] CV LAB;  Service: Cardiovascular;  Laterality: Bilateral;   AMPUTATION Right 09/07/2018   Procedure: Right fifth metatarsectomy;  Surgeon: [name], DPM;  Location: [place];  Service: Podiatry;  Laterality: Right;   AMPUTATION Left 03/08/2019   Procedure: AMPUTATION  3RD AND 4TH TOES LEFT FOOT;  Surgeon: [name], DPM;  Location: [place];  Service: Podiatry;  Laterality: Left;   AMPUTATION Left 03/15/2019   Procedure: AMPUTATION BELOW KNEE;  Surgeon: Elam Dutch, MD;  Location: [facility] OR;  Service: Vascular;  Laterality: Left;   AV FISTULA PLACEMENT Right 12/13/2012   Procedure: ARTERIOVENOUS (AV) FISTULA CREATION;  Surgeon: [name], MD;  Location: [place];  Service: Vascular;  Laterality: Right;  Ultrasound guided   AV FISTULA PLACEMENT Left 05/07/2016   Procedure: LEFT RADIOCEPHALIC ARTERIOVENOUS (AV) FISTULA CREATION;  Surgeon: [name], MD;  Location: [facility]  OR;  Service: Vascular;  Laterality: Left;   AV FISTULA PLACEMENT Right 08/09/2019   Procedure: INSERTION OF RIGHT ARTERIOVENOUS (AV) 4-62m GORE-TEX STRETCH GRAFT RIGHT  ARM;  Surgeon: FElam Dutch MD;  [place]  Service: Vascular;  Laterality: Right;   BAlphaRight 03/26/2016   Procedure: RIGHT BASILIC VEIN TRANSPOSITION;  Surgeon: TRosetta Posner MD;  [place]  Service: Vascular;  Laterality: Right;   CARDIAC CATHETERIZATION     CARDIAC DEFIBRILLATOR PLACEMENT     [employer]   CATARACT EXTRACTION W/PHACO Bilateral    EYE SURGERY Bilateral    Cataract   FISTULOGRAM Left 04/22/2018   Procedure:  FISTULOGRAM UPPER EXTREMITY;  Surgeon: CMarty Heck MD;  [place]  Service: Vascular;  Laterality: Left;   I & D EXTREMITY Right 07/15/2018   Procedure: IRRIGATION AND DEBRIDEMENT RIGHT FOOT;  Surgeon: PEvelina Bucy DPM;  [place]  Service: Podiatry;  Laterality: Right;   INCISION AND DRAINAGE ABSCESS / HEMATOMA OF BURSA / KNEE / THIGH Left 2012   "knee" (03/11/2013)   INSERT / REPLACE / REMOVE PACEMAKER     INSERTION OF DIALYSIS CATHETER Left 04/22/2018   Procedure: INSERTION OF DIALYSIS CATHETER;  Surgeon: CMarty Heck MD;  [place]  Service: Vascular;  Laterality: Left;   IR FLUORO GUIDE CV LINE LEFT  07/15/2018   IR PTA VENOUS EXCEPT DIALYSIS CIRCUIT  07/15/2018   LOWER EXTREMITY ANGIOGRAPHY Right 07/21/2018   Procedure: LOWER EXTREMITY ANGIOGRAPHY;  Surgeon: CMarty Heck MD;  [place];  Service: Cardiovascular;  Laterality: Right;   LOWER EXTREMITY ANGIOGRAPHY Bilateral 02/25/2019   Procedure: Lower Extremity Angiography;  Surgeon: FElam Dutch MD;  [place];  Service: Cardiovascular;  Laterality: Bilateral;   METATARSAL HEAD EXCISION Right 07/15/2018   Procedure: METATARSAL RESECTION;  Surgeon: PEvelina Bucy DPM;  [place]  Service: Podiatry;  Laterality: Right;   MULTIPLE TOOTH EXTRACTIONS     PERIPHERAL VASCULAR ATHERECTOMY Right 02/28/2019   Procedure: PERIPHERAL VASCULAR ATHERECTOMY;  Surgeon: CWaynetta Sandy MD;  [place];  Service: Cardiovascular;  Laterality: Right;  right tp trunk    PERIPHERAL VASCULAR BALLOON ANGIOPLASTY Left 02/25/2019   Procedure: PERIPHERAL VASCULAR BALLOON ANGIOPLASTY;  Surgeon: FElam Dutch MD;  [place];  Service: Cardiovascular;  Laterality: Left;  tibial peroneal trunk and PT   PERIPHERAL VASCULAR INTERVENTION Right 07/21/2018   Procedure: PERIPHERAL VASCULAR INTERVENTION;  Surgeon: CMarty Heck MD;  [place];  Service: Cardiovascular;  Laterality: Right;  peroneal stents   REVISON OF ARTERIOVENOUS FISTULA Right 35/36/6440  Procedure: Plication OF Right Arm ARTERIOVENOUS FISTULA;  Surgeon: CElam Dutch MD;  [place]  Service: Vascular;  Laterality: Right;   REVISON OF ARTERIOVENOUS FISTULA Left 04/22/2018   Procedure: REVISION OF RADIOCEPHALIC ARTERIOVENOUS FISTULA;  Surgeon: CMarty Heck MD;  [place]  Service: Vascular;  Laterality: Left;   SHUNTOGRAM N/A 05/31/2013   Procedure: SEarney Mallet  Surgeon: VSerafina Mitchell MD;  [facility];  Service: Cardiovascular;  Laterality: N/A;   UPPER EXTREMITY VENOGRAPHY Right 07/23/2018   Procedure: UPPER EXTREMITY VENOGRAPHY;  Surgeon: DAngelia Mould MD;  [place];  Service: Cardiovascular;  Laterality: Right;   UPPER EXTREMITY VENOGRAPHY  07/15/2019   Procedure: UPPER EXTREMITY VENOGRAPHY;  Surgeon: FElam Dutch MD;  [facility];  Service: Cardiovascular;;  rt arm    WOUND DEBRIDEMENT Right 07/17/2018   Procedure: Wound Debridement; Closure Filleted toe flap Right Foot;  Surgeon: [name], DPM;  Location: [place];  Service: Podiatry;  Laterality: Right;   WOUND DEBRIDEMENT Right 09/07/2018   Procedure: Debridement Right Foot Wound, application of wound vac;  Surgeon: [name], DPM;  Location: [place];  Service: Podiatry;  Laterality: Right;   WOUND DEBRIDEMENT Right 03/08/2019   Procedure: DEBRIDEMENT WOUND RIGHT FOOT;  Surgeon: [name], DPM;  Location: [place];  Service: Podiatry;  Laterality: Right;    There were no vitals filed for this visit.   Subjective Assessment - 01/24/21 1502     Subjective no new complaints today    Pertinent History PAD, DM2, arthritis, gout, CHF, implanted cardioverter-defib, HTN, CKD w/ ESRD    Patient Stated Goals to use prosthesis to walk in community,                                [facility] Adult PT Treatment/Exercise - 01/24/21 0001       Transfers   Transfers Sit to Stand;Stand to Sit    Sit to Stand 5: Supervision;With upper extremity assist;With armrests;From chair/3-in-1;Other (comment)    Sit to Stand Details Verbal cues for sequencing;Verbal cues for technique;Visual cues/gestures for sequencing    Stand to Sit 5: Supervision;With upper extremity assist;With armrests;To chair/3-in-1;Other (comment)      Ambulation/Gait   Ambulation/Gait Yes    Ambulation/Gait Assistance 5: Supervision    Ambulation Distance (Feet) 100 Feet   x 2   Assistive device Rolling walker;Prosthesis      Neuro Re-ed    Neuro Re-ed Details  tandem balance, blance with feet normal width apart and turning trunk looking over shoulder X10 bilat      Knee/Hip Exercises: Stretches   Active Hamstring Stretch Both;2 reps;30 seconds    Active Hamstring Stretch Limitations supine SLR with strap bw sets on leg press      Knee/Hip Exercises: Aerobic   Nustep seat 12 Level 6 for 8 min      Knee/Hip Exercises: Machines for Strengthening   Cybex Leg Press BLEs 106# 10 reps 3 sets then 62# with alt leg marches for for TKE stability in stance phase of gait X 10 bilat      Knee/Hip Exercises: Standing   Forward Step Up Both;5 reps;Hand Hold: 2;Step Height: 6"    Forward Step Up Limitations demo, verbal & tactile cues for technique    Step Down Both;1 set;5 reps;Hand Hold: 2;Step Height: 6"    Step Down Limitations demo, verbal & tactile cues for technique      Knee/Hip Exercises: Seated   Sit to Sand 3 sets;5 reps;without UE support   from 24 inch barstool                     PT Short Term Goals - 01/22/21 1513       PT SHORT TERM GOAL #1   Title Patient verbalizes understanding of signs of sweat & drying limb/liner    Baseline MEt 01/17/21    Time 4    Period Weeks    Status New    Target Date 02/14/21      PT SHORT TERM GOAL #2    Title Patient standing balance scans & reaches 5" without UE support with supervision.  Time 4    Period Weeks    Status Revised    Target Date 02/14/21      PT SHORT TERM GOAL #3   Title Patient sit to/from stand with chairs without armrests to RW with supervision.    Time 4    Period Weeks    Status New    Target Date 02/14/21      PT SHORT TERM GOAL #4   Title Patient ambulates 22' with RW & prosthesis with supervision.    Time 4    Period Weeks    Status Revised    Target Date 02/14/21      PT SHORT TERM GOAL #5   Title Patient negotiates ramps & curbs with RW & prostheis with supervision.    Time 4    Period Weeks    Status Revised    Target Date 02/14/21               PT Long Term Goals - 12/18/20 1658       PT LONG TERM GOAL #1   Title Patient & family verbalize & demonstrate understanding of proper prosthetic care to enable utilization of prosthesis.    Time 12    Period Weeks    Status On-going    Target Date 03/14/21      PT LONG TERM GOAL #2   Title Patient tolerates wear of prosthesis >90% of awake hours without skin or limb discomfort issues.    Time 12    Period Weeks    Status On-going    Target Date 03/14/21      PT LONG TERM GOAL #3   Title Tasks of Oceanographer with RW  >/= 45/56    Time 12    Period Weeks    Status Revised    Target Date 03/14/21      PT LONG TERM GOAL #4   Title Patient ambulates 250' with RW & prosthesis modified independent.    Time 12    Period Weeks    Status Revised    Target Date 03/14/21      PT LONG TERM GOAL #5   Title Patient negotiates ramps & curbs with RW & prosthesis with supervision    Time 12    Period Weeks    Status Revised    Target Date 03/14/21                   Plan - 01/24/21 1510     Clinical Impression Statement He demonstrated improved overall endurance today and needed less rest breaks with standing activity. Overall is making steady progress in all areas and will  continue to benefit from PT.    Personal Factors and Comorbidities Comorbidity 3+;Fitness;Time since onset of injury/illness/exacerbation    Comorbidities PAD, DM2, arthritis, gout, CHF, implanted cardioverter-defib, HTN, CKD w/ ESRD, blind left knee    Examination-Activity Limitations Locomotion Level;Stairs;Stand;Transfers    Examination-Participation Restrictions Community Activity    Stability/Clinical Decision Making Evolving/Moderate complexity    Rehab Potential Good    PT Frequency 2x / week    PT Duration 12 weeks    PT Treatment/Interventions ADLs/Self Care Home Management;DME Instruction;Gait training;Stair training;Functional mobility training;Therapeutic activities;Therapeutic exercise;Balance training;Neuromuscular re-education;Patient/family education;Prosthetic Training;Manual techniques;Vestibular    PT Next Visit Plan work towards updated STGs, progress as tolerated prosthetic gait with RW including ramps & curbs,  exercise for strength, balance, flexibility & endurance.    Consulted and Agree with Plan of Care Patient;Family member/caregiver  Family Member Consulted daughter,Sherry Cira Servant             Patient will benefit from skilled therapeutic intervention in order to improve the following deficits and impairments:  Abnormal gait, Cardiopulmonary status limiting activity, Decreased activity tolerance, Decreased balance, Decreased endurance, Decreased knowledge of use of DME, Decreased mobility, Decreased range of motion, Decreased scar mobility, Decreased strength, Increased edema, Postural dysfunction, Prosthetic Dependency  Visit Diagnosis: Other abnormalities of gait and mobility  Muscle weakness (generalized)  Stiffness of right knee, not elsewhere classified  Unsteadiness on feet  Abnormal posture     Problem List Patient Active Problem List   Diagnosis Date Noted   Polycythemia, secondary 02/21/2020   Stable treated proliferative diabetic  retinopathy of right eye determined by examination associated with type 2 diabetes mellitus ([place]) 11/29/2019   Stable treated proliferative diabetic retinopathy of left eye without macular edema determined by examination associated with type 2 diabetes mellitus ([place]) 11/29/2019   Advanced nonexudative age-related macular degeneration of both eyes with subfoveal involvement 11/29/2019   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   Sepsis ([place]) 04/14/2019   Hypocalcemia 04/08/2019   Drug induced constipation    Anemia of chronic disease    ESRD on dialysis ([place])    Postoperative pain    Phantom limb pain (HCC)    S/P BKA (below knee amputation), left ([place]) 03/18/2019   Unilateral complete BKA, left, initial encounter ([place]) 03/17/2019   Critical lower limb ischemia ([place]) 03/12/2019   Infection of amputation stump, right lower extremity ([place]) 03/10/2019   Hypotension 02/23/2019   Cellulitis of left foot 02/23/2019   Gangrene of toe of left foot ([place]) 70/96/2836   Complication of vascular dialysis catheter 11/24/2018   Chronic osteomyelitis involving right ankle and foot ([place])    Ulcer of right foot with fat layer exposed ([place]) 09/02/2018   Encounter for planned post-operative wound closure    Acute osteomyelitis of metatarsal bone of right foot ([place])    Diabetic ulcer of midfoot associated with diabetes mellitus due to underlying condition, with necrosis of bone ([place])    Osteomyelitis of right foot ([place])    Vascular calcification    Cellulitis and abscess of foot, except toes    Anemia associated with chronic renal failure 07/13/2018   Cardiomyopathy, dilated, nonischemic ([place]) 07/13/2018   Diabetic foot infection ([place]) 07/13/2018   PAD (peripheral artery disease) ([place])    Macular degeneration    Ventricular tachycardia ([place]) 06/24/2017   Embolism due to vascular prosthetic devices, implants and grafts, initial encounter ([place]) 02/04/2016   Dependence on renal dialysis ([place])  05/30/2015   Shortness of breath 62/94/7654   Chronic systolic heart failure ([place]) 08/22/2014   Diabetic infection of right foot ([place]) 07/13/2014   Type 2 diabetes mellitus with diabetic peripheral angiopathy without gangrene ([place]) 03/18/2014   Anxiety disorder, unspecified 02/23/2014   Fever, unspecified 02/23/2014   Headache 02/23/2014   Pain, unspecified 02/23/2014   Pruritus, unspecified 02/23/2014   Mild protein-calorie malnutrition ([place]) 12/26/2013   ESRD (end stage renal disease) on dialysis ([place]) 11/11/2013   Snoring 11/11/2013   Unspecified sleep apnea 11/11/2013   Other pancytopenia ([place]) 09/22/2013   Hemoptysis 09/20/2013   Personal history of nicotine dependence 07/12/2013   Coagulation defect, unspecified ([place]) 05/16/2013   Iron deficiency anemia, unspecified 05/06/2013   Other disorders of plasma-protein metabolism, not elsewhere classified 04/15/2013   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 chronic kidney disease or end stage renal disease ([place]) 03/18/2013  Other cardiomyopathies ([place]) 03/18/2013   Secondary hyperparathyroidism of renal origin ([place]) 03/18/2013   Type 2 diabetes mellitus without complications ([place]) 80/01/3493   Pre-transplant evaluation for kidney transplant 01/11/2013   Automatic implantable cardioverter-defibrillator in situ 10/01/2010   HYPERCHOLESTEROLEMIA, MIXED 05/03/2010   Essential hypertension 05/03/2010   CHF 05/03/2010    Silvestre Mesi 01/24/2021, 3:12 PM  [facility] Physical Therapy [address] [place], [place], [address] Phone: [phone]   Fax:  [phone]  Name: LADARRION TELFAIR MRN: [id number] Date of Birth: 05/22/1946

## 2021-01-25 DIAGNOSIS — N2581 Secondary hyperparathyroidism of renal origin: Secondary | ICD-10-CM | POA: Diagnosis not present

## 2021-01-25 DIAGNOSIS — N186 End stage renal disease: Secondary | ICD-10-CM | POA: Diagnosis not present

## 2021-01-25 DIAGNOSIS — Z992 Dependence on renal dialysis: Secondary | ICD-10-CM | POA: Diagnosis not present

## 2021-01-25 DIAGNOSIS — E119 Type 2 diabetes mellitus without complications: Secondary | ICD-10-CM | POA: Diagnosis not present

## 2021-01-25 DIAGNOSIS — D689 Coagulation defect, unspecified: Secondary | ICD-10-CM | POA: Diagnosis not present

## 2021-01-28 DIAGNOSIS — Z992 Dependence on renal dialysis: Secondary | ICD-10-CM | POA: Diagnosis not present

## 2021-01-28 DIAGNOSIS — D689 Coagulation defect, unspecified: Secondary | ICD-10-CM | POA: Diagnosis not present

## 2021-01-28 DIAGNOSIS — N186 End stage renal disease: Secondary | ICD-10-CM | POA: Diagnosis not present

## 2021-01-28 DIAGNOSIS — N2581 Secondary hyperparathyroidism of renal origin: Secondary | ICD-10-CM | POA: Diagnosis not present

## 2021-01-29 ENCOUNTER — Ambulatory Visit (INDEPENDENT_AMBULATORY_CARE_PROVIDER_SITE_OTHER): Payer: Medicare Other | Admitting: Podiatry

## 2021-01-29 ENCOUNTER — Ambulatory Visit (INDEPENDENT_AMBULATORY_CARE_PROVIDER_SITE_OTHER): Payer: Medicare Other | Admitting: Physical Therapy

## 2021-01-29 ENCOUNTER — Encounter: Payer: Self-pay | Admitting: Physical Therapy

## 2021-01-29 ENCOUNTER — Other Ambulatory Visit: Payer: Self-pay

## 2021-01-29 DIAGNOSIS — M25661 Stiffness of right knee, not elsewhere classified: Secondary | ICD-10-CM

## 2021-01-29 DIAGNOSIS — R2681 Unsteadiness on feet: Secondary | ICD-10-CM

## 2021-01-29 DIAGNOSIS — L97511 Non-pressure chronic ulcer of other part of right foot limited to breakdown of skin: Secondary | ICD-10-CM

## 2021-01-29 DIAGNOSIS — R2689 Other abnormalities of gait and mobility: Secondary | ICD-10-CM

## 2021-01-29 DIAGNOSIS — R293 Abnormal posture: Secondary | ICD-10-CM | POA: Diagnosis not present

## 2021-01-29 DIAGNOSIS — M6281 Muscle weakness (generalized): Secondary | ICD-10-CM | POA: Diagnosis not present

## 2021-01-29 NOTE — Therapy (Signed)
[facility] Physical Therapy [address] [place], [place], [address] Phone: [phone]   Fax:  [phone]  Physical Therapy Treatment  Patient Details  Name: Timothy Faulkner MRN: [id number] Date of Birth: August 23, 1945 Referring Provider (PT): Monica Martinez, MD   Encounter Date: 01/29/2021   PT End of Session - 01/29/21 1523     Visit Number 23    Number of Visits 58    Date for PT Re-Evaluation 12/24/20    Authorization Type UHC Medicare    Progress Note Due on Visit 84    PT Start Time 1430    PT Stop Time 1515    PT Time Calculation (min) 45 min    Equipment Utilized During Treatment Gait belt    Activity Tolerance Patient tolerated treatment well;Patient limited by fatigue    Behavior During Therapy WFL for tasks assessed/performed             Past Medical History:  Diagnosis Date   Anemia    Arthritis    Gout   Automatic implantable cardioverter-defibrillator in situ    [employer]   CHF    ESRD on dialysis [facility])    [name] 03/11/2013 (03/11/2013) dialysis M/W/F   Gangrene ([place])    left foot   GERD (gastroesophageal reflux disease)    Gout    "once a year"   Heart murmur    HYPERCHOLESTEROLEMIA, MIXED    HYPERTENSION    Macular degeneration    Osteomyelitis ([place])    right foot   Other primary cardiomyopathies 07/16/2011   Pacemaker    Peripheral arterial disease (HCC)    left fifth toe ulcer, healing   Pneumonia    Shortness of breath    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 ([place])    left fifth toe   Wears dentures    Wears glasses     Past Surgical History:  Procedure Laterality Date   A/V FISTULAGRAM Left 07/20/2018   Procedure: A/V FISTULAGRAM;  Surgeon: [name], MD;  Location: [place] CV LAB;  Service: Cardiovascular;  Laterality: Left;   A/V FISTULAGRAM N/A 07/15/2019   Procedure: A/V FISTULAGRAM - Left Arm;  Surgeon: Elam Dutch, MD;  Location: [place] [facility];  Service:  Cardiovascular;  Laterality: N/A;   ABDOMINAL AORTOGRAM N/A 02/25/2019   Procedure: ABDOMINAL AORTOGRAM;  Surgeon: Elam Dutch, MD;  Location: [place] [facility];  Service: Cardiovascular;  Laterality: N/A;   ABDOMINAL AORTOGRAM W/LOWER EXTREMITY Bilateral 07/20/2018   Procedure: ABDOMINAL AORTOGRAM W/LOWER EXTREMITY;  Surgeon: [name], MD;  Location: [place] [facility];  Service: Cardiovascular;  Laterality: Bilateral;   AMPUTATION Right 09/07/2018   Procedure: Right fifth metatarsectomy;  Surgeon: [name], DPM;  Location: [place];  Service: Podiatry;  Laterality: Right;   AMPUTATION Left 03/08/2019   Procedure: AMPUTATION  3RD AND 4TH TOES LEFT FOOT;  Surgeon: [name], DPM;  Location: [place];  Service: Podiatry;  Laterality: Left;   AMPUTATION Left 03/15/2019   Procedure: AMPUTATION BELOW KNEE;  Surgeon: Elam Dutch, MD;  Location: [facility] OR;  Service: Vascular;  Laterality: Left;   AV FISTULA PLACEMENT Right 12/13/2012   Procedure: ARTERIOVENOUS (AV) FISTULA CREATION;  Surgeon: [name], MD;  Location: [place];  Service: Vascular;  Laterality: Right;  Ultrasound guided   AV FISTULA PLACEMENT Left 05/07/2016   Procedure: LEFT RADIOCEPHALIC ARTERIOVENOUS (AV) FISTULA CREATION;  Surgeon: [name], MD;  Location: [facility]  OR;  Service: Vascular;  Laterality: Left;   AV FISTULA PLACEMENT Right 08/09/2019   Procedure: INSERTION OF RIGHT ARTERIOVENOUS (AV) 4-73m GORE-TEX STRETCH GRAFT RIGHT  ARM;  Surgeon: FElam Dutch MD;  [place]  Service: Vascular;  Laterality: Right;   [place] 03/26/2016   Procedure: RIGHT BASILIC VEIN TRANSPOSITION;  Surgeon: TRosetta Posner MD;  [place]  Service: Vascular;  Laterality: Right;   CARDIAC CATHETERIZATION     CARDIAC DEFIBRILLATOR PLACEMENT     [employer]   CATARACT EXTRACTION W/PHACO Bilateral    EYE SURGERY Bilateral    Cataract   FISTULOGRAM Left 04/22/2018   Procedure:  FISTULOGRAM UPPER EXTREMITY;  Surgeon: CMarty Heck MD;  [place]  Service: Vascular;  Laterality: Left;   I & D EXTREMITY Right 07/15/2018   Procedure: IRRIGATION AND DEBRIDEMENT RIGHT FOOT;  Surgeon: PEvelina Bucy DPM;  [place]  Service: Podiatry;  Laterality: Right;   INCISION AND DRAINAGE ABSCESS / HEMATOMA OF BURSA / KNEE / THIGH Left 2012   "knee" (03/11/2013)   INSERT / REPLACE / REMOVE PACEMAKER     INSERTION OF DIALYSIS CATHETER Left 04/22/2018   Procedure: INSERTION OF DIALYSIS CATHETER;  Surgeon: CMarty Heck MD;  [place]  Service: Vascular;  Laterality: Left;   IR FLUORO GUIDE CV LINE LEFT  07/15/2018   IR PTA VENOUS EXCEPT DIALYSIS CIRCUIT  07/15/2018   LOWER EXTREMITY ANGIOGRAPHY Right 07/21/2018   Procedure: LOWER EXTREMITY ANGIOGRAPHY;  Surgeon: CMarty Heck MD;  [place];  Service: Cardiovascular;  Laterality: Right;   LOWER EXTREMITY ANGIOGRAPHY Bilateral 02/25/2019   Procedure: Lower Extremity Angiography;  Surgeon: FElam Dutch MD;  [place];  Service: Cardiovascular;  Laterality: Bilateral;   METATARSAL HEAD EXCISION Right 07/15/2018   Procedure: METATARSAL RESECTION;  Surgeon: PEvelina Bucy DPM;  [place]  Service: Podiatry;  Laterality: Right;   MULTIPLE TOOTH EXTRACTIONS     PERIPHERAL VASCULAR ATHERECTOMY Right 02/28/2019   Procedure: PERIPHERAL VASCULAR ATHERECTOMY;  Surgeon: CWaynetta Sandy MD;  [place];  Service: Cardiovascular;  Laterality: Right;  right tp trunk    PERIPHERAL VASCULAR BALLOON ANGIOPLASTY Left 02/25/2019   Procedure: PERIPHERAL VASCULAR BALLOON ANGIOPLASTY;  Surgeon: FElam Dutch MD;  [place];  Service: Cardiovascular;  Laterality: Left;  tibial peroneal trunk and PT   PERIPHERAL VASCULAR INTERVENTION Right 07/21/2018   Procedure: PERIPHERAL VASCULAR INTERVENTION;  Surgeon: CMarty Heck MD;  [place];  Service: Cardiovascular;  Laterality: Right;  peroneal stents   REVISON OF ARTERIOVENOUS FISTULA Right 31/08/7508  Procedure: Plication OF Right Arm ARTERIOVENOUS FISTULA;  Surgeon: CElam Dutch MD;  [place]  Service: Vascular;  Laterality: Right;   REVISON OF ARTERIOVENOUS FISTULA Left 04/22/2018   Procedure: REVISION OF RADIOCEPHALIC ARTERIOVENOUS FISTULA;  Surgeon: CMarty Heck MD;  [place]  Service: Vascular;  Laterality: Left;   SHUNTOGRAM N/A 05/31/2013   Procedure: SEarney Mallet  Surgeon: VSerafina Mitchell MD;  [facility];  Service: Cardiovascular;  Laterality: N/A;   UPPER EXTREMITY VENOGRAPHY Right 07/23/2018   Procedure: UPPER EXTREMITY VENOGRAPHY;  Surgeon: DAngelia Mould MD;  [place];  Service: Cardiovascular;  Laterality: Right;   UPPER EXTREMITY VENOGRAPHY  07/15/2019   Procedure: UPPER EXTREMITY VENOGRAPHY;  Surgeon: FElam Dutch MD;  [facility];  Service: Cardiovascular;;  rt arm    WOUND DEBRIDEMENT Right 07/17/2018   Procedure: Wound Debridement; Closure Filleted toe flap Right Foot;  Surgeon: [name], DPM;  Location: [place];  Service: Podiatry;  Laterality: Right;   WOUND DEBRIDEMENT Right 09/07/2018   Procedure: Debridement Right Foot Wound, application of wound vac;  Surgeon: [name], DPM;  Location: [place];  Service: Podiatry;  Laterality: Right;   WOUND DEBRIDEMENT Right 03/08/2019   Procedure: DEBRIDEMENT WOUND RIGHT FOOT;  Surgeon: [name], DPM;  Location: [place];  Service: Podiatry;  Laterality: Right;    There were no vitals filed for this visit.   Subjective Assessment - 01/29/21 1430     Subjective He has been walking with RW in home and out to car.  He is wearing prosthesis most of awake hours including to dialysis.    Pertinent History PAD, DM2, arthritis, gout, CHF, implanted cardioverter-defib, HTN,  CKD w/ ESRD    Patient Stated Goals to use prosthesis to walk in community,    Currently in Pain? No/denies                               [facility] Adult PT Treatment/Exercise - 01/29/21 1430       Transfers   Transfers Sit to Stand;Stand to Sit    Sit to Stand 5: Supervision;With upper extremity assist;With armrests;From chair/3-in-1;Other (comment)    Sit to Stand Details Verbal cues for sequencing;Verbal cues for technique;Visual cues/gestures for sequencing    Stand to Sit 5: Supervision;With upper extremity assist;With armrests;To chair/3-in-1;Other (comment)      Ambulation/Gait   Ambulation/Gait Yes    Ambulation/Gait Assistance 5: Supervision    Ambulation Distance (Feet) 100 Feet   60' ramp & curb and 100'   Assistive device Rolling walker;Prosthesis    Ramp 4: Min assist   RW & TTA prosthesis   Ramp Details (indicate cue type and reason) verbal & tactile cues on technique    Curb 4: Min assist   min guard RW & TTA prosthesis   Curb Details (indicate cue type and reason) verbal & tactile cues on technique    Gait Comments endurance pt amb 20' + ramp & curb then 40' before seated rest.      High Level Balance   High Level Balance Activities Side stepping;Backward walking      Neuro Re-ed    Neuro Re-ed Details  tandem balance, blance with feet normal width apart and turning trunk looking over shoulder X10 bilat      Knee/Hip Exercises: Stretches   Active Hamstring Stretch Both;2 reps;30 seconds   AAROM   Active Hamstring Stretch Limitations supine SLR with strap bw sets on leg press      Knee/Hip Exercises: Aerobic   Nustep --      Knee/Hip Exercises: Machines for Strengthening   Cybex Leg Press BLEs 100# 15 reps 2 sets with back flat LLE stance stability lifting off plate      Knee/Hip Exercises: Standing   Forward Step Up Both;5 reps;Hand Hold: 2;Step Height: 6"    Forward Step Up Limitations demo, verbal & tactile cues for technique    Step Down  Both;1 set;5 reps;Hand Hold: 2;Step Height: 6"    Step Down Limitations demo, verbal & tactile cues for technique      Knee/Hip Exercises: Seated   Sit to Sand 5 reps;without UE support;2  sets   from 24 inch barstool, ggoal to not touch //bars to stabilize     Prosthetics   Prosthetic Care Comments  He has appt with foot clinic and anticipates being able to wear normal shoe instead of cast shoe.  If he goes back into normal shoe, PT recommending appt with prosthetist for alignment.    Current prosthetic wear tolerance (days/week)  daily including dialysis    Current prosthetic wear tolerance (#hours/day)  ~90% of awake hours    Education Provided Other (comment)   see prosthetic care comments   Person(s) Educated Patient;Child(ren)    Education Method Explanation;Verbal cues    Education Method Verbalized understanding;Verbal cues required                      PT Short Term Goals - 01/22/21 1513       PT SHORT TERM GOAL #1   Title Patient verbalizes understanding of signs of sweat & drying limb/liner    Baseline MEt 01/17/21    Time 4    Period Weeks    Status New    Target Date 02/14/21      PT SHORT TERM GOAL #2   Title Patient standing balance scans & reaches 5" without UE support with supervision.    Time 4    Period Weeks    Status Revised    Target Date 02/14/21      PT SHORT TERM GOAL #3   Title Patient sit to/from stand with chairs without armrests to RW with supervision.    Time 4    Period Weeks    Status New    Target Date 02/14/21      PT SHORT TERM GOAL #4   Title Patient ambulates 6' with RW & prosthesis with supervision.    Time 4    Period Weeks    Status Revised    Target Date 02/14/21      PT SHORT TERM GOAL #5   Title Patient negotiates ramps & curbs with RW & prostheis with supervision.    Time 4    Period Weeks    Status Revised    Target Date 02/14/21               PT Long Term Goals - 12/18/20 1658       PT LONG TERM  GOAL #1   Title Patient & family verbalize & demonstrate understanding of proper prosthetic care to enable utilization of prosthesis.    Time 12    Period Weeks    Status On-going    Target Date 03/14/21      PT LONG TERM GOAL #2   Title Patient tolerates wear of prosthesis >90% of awake hours without skin or limb discomfort issues.    Time 12    Period Weeks    Status On-going    Target Date 03/14/21      PT LONG TERM GOAL #3   Title Tasks of Oceanographer with RW  >/= 45/56    Time 12    Period Weeks    Status Revised    Target Date 03/14/21      PT LONG TERM GOAL #4   Title Patient ambulates 250' with RW & prosthesis modified independent.    Time 12    Period Weeks    Status Revised    Target Date 03/14/21      PT LONG TERM GOAL #5   Title Patient negotiates ramps &  curbs with RW & prosthesis with supervision    Time 12    Period Weeks    Status Revised    Target Date 03/14/21                   Plan - 01/29/21 1523     Clinical Impression Statement Patient improved endurance enabling him to ambulate & negotiate ramp/curb similar to limited distance car to building.    Personal Factors and Comorbidities Comorbidity 3+;Fitness;Time since onset of injury/illness/exacerbation    Comorbidities PAD, DM2, arthritis, gout, CHF, implanted cardioverter-defib, HTN, CKD w/ ESRD, blind left knee    Examination-Activity Limitations Locomotion Level;Stairs;Stand;Transfers    Examination-Participation Restrictions Community Activity    Stability/Clinical Decision Making Evolving/Moderate complexity    Rehab Potential Good    PT Frequency 2x / week    PT Duration 12 weeks    PT Treatment/Interventions ADLs/Self Care Home Management;DME Instruction;Gait training;Stair training;Functional mobility training;Therapeutic activities;Therapeutic exercise;Balance training;Neuromuscular re-education;Patient/family education;Prosthetic Training;Manual techniques;Vestibular    PT Next  Visit Plan work towards updated STGs, progress as tolerated prosthetic gait with RW including ramps & curbs,  exercise for strength, balance, flexibility & endurance.    Consulted and Agree with Plan of Care Patient;Family member/caregiver    Family Member Consulted daughter,Sherry Cira Servant             Patient will benefit from skilled therapeutic intervention in order to improve the following deficits and impairments:  Abnormal gait, Cardiopulmonary status limiting activity, Decreased activity tolerance, Decreased balance, Decreased endurance, Decreased knowledge of use of DME, Decreased mobility, Decreased range of motion, Decreased scar mobility, Decreased strength, Increased edema, Postural dysfunction, Prosthetic Dependency  Visit Diagnosis: Muscle weakness (generalized)  Other abnormalities of gait and mobility  Stiffness of right knee, not elsewhere classified  Unsteadiness on feet  Abnormal posture     Problem List Patient Active Problem List   Diagnosis Date Noted   Polycythemia, secondary 02/21/2020   Stable treated proliferative diabetic retinopathy of right eye determined by examination associated with type 2 diabetes mellitus ([place]) 11/29/2019   Stable treated proliferative diabetic retinopathy of left eye without macular edema determined by examination associated with type 2 diabetes mellitus ([place]) 11/29/2019   Advanced nonexudative age-related macular degeneration of both eyes with subfoveal involvement 11/29/2019   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   Sepsis ([place]) 04/14/2019   Hypocalcemia 04/08/2019   Drug induced constipation    Anemia of chronic disease    ESRD on dialysis ([place])    Postoperative pain    Phantom limb pain (HCC)    S/P BKA (below knee amputation), left ([place]) 03/18/2019   Unilateral complete BKA, left, initial encounter ([place]) 03/17/2019   Critical lower limb ischemia ([place]) 03/12/2019   Infection of amputation stump, right  lower extremity ([place]) 03/10/2019   Hypotension 02/23/2019   Cellulitis of left foot 02/23/2019   Gangrene of toe of left foot ([place]) 76/28/3151   Complication of vascular dialysis catheter 11/24/2018   Chronic osteomyelitis involving right ankle and foot ([place])    Ulcer of right foot with fat layer exposed ([place]) 09/02/2018   Encounter for planned post-operative wound closure    Acute osteomyelitis of metatarsal bone of right foot ([place])    Diabetic ulcer of midfoot associated with diabetes mellitus due to underlying condition, with necrosis of bone ([place])    Osteomyelitis of right foot ([place])    Vascular calcification    Cellulitis and abscess of foot, except toes    Anemia  associated with chronic renal failure 07/13/2018   Cardiomyopathy, dilated, nonischemic (HCC) 07/13/2018   Diabetic foot infection ([place]) 07/13/2018   PAD (peripheral artery disease) ([place])    Macular degeneration    Ventricular tachycardia ([place]) 06/24/2017   Embolism due to vascular prosthetic devices, implants and grafts, initial encounter ([place]) 02/04/2016   Dependence on renal dialysis ([place]) 05/30/2015   Shortness of breath 49/44/7395   Chronic systolic heart failure ([place]) 08/22/2014   Diabetic infection of right foot ([place]) 07/13/2014   Type 2 diabetes mellitus with diabetic peripheral angiopathy without gangrene ([place]) 03/18/2014   Anxiety disorder, unspecified 02/23/2014   Fever, unspecified 02/23/2014   Headache 02/23/2014   Pain, unspecified 02/23/2014   Pruritus, unspecified 02/23/2014   Mild protein-calorie malnutrition ([place]) 12/26/2013   ESRD (end stage renal disease) on dialysis ([place]) 11/11/2013   Snoring 11/11/2013   Unspecified sleep apnea 11/11/2013   Other pancytopenia ([place]) 09/22/2013   Hemoptysis 09/20/2013   Personal history of nicotine dependence 07/12/2013   Coagulation defect, unspecified ([place]) 05/16/2013   Iron deficiency anemia, unspecified 05/06/2013   Other disorders of plasma-protein  metabolism, not elsewhere classified 04/15/2013   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 chronic kidney disease or end stage renal disease ([place]) 03/18/2013   Other cardiomyopathies ([place]) 03/18/2013   Secondary hyperparathyroidism of renal origin ([place]) 03/18/2013   Type 2 diabetes mellitus without complications ([place]) 84/41/7127   Pre-transplant evaluation for kidney transplant 01/11/2013   Automatic implantable cardioverter-defibrillator in situ 10/01/2010   HYPERCHOLESTEROLEMIA, MIXED 05/03/2010   Essential hypertension 05/03/2010   CHF 05/03/2010    Jamey Reas, PT, DPT 01/29/2021, 3:25 PM  [facility] Physical Therapy [address] [place], [place], [address] Phone: [phone]   Fax:  [phone]  Name: BREK REECE MRN: [id number] Date of Birth: 1946-07-23

## 2021-01-30 DIAGNOSIS — N2581 Secondary hyperparathyroidism of renal origin: Secondary | ICD-10-CM | POA: Diagnosis not present

## 2021-01-30 DIAGNOSIS — Z992 Dependence on renal dialysis: Secondary | ICD-10-CM | POA: Diagnosis not present

## 2021-01-30 DIAGNOSIS — D689 Coagulation defect, unspecified: Secondary | ICD-10-CM | POA: Diagnosis not present

## 2021-01-30 DIAGNOSIS — N186 End stage renal disease: Secondary | ICD-10-CM | POA: Diagnosis not present

## 2021-01-31 ENCOUNTER — Other Ambulatory Visit: Payer: Self-pay

## 2021-01-31 ENCOUNTER — Ambulatory Visit (INDEPENDENT_AMBULATORY_CARE_PROVIDER_SITE_OTHER): Payer: Medicare Other | Admitting: Physical Therapy

## 2021-01-31 DIAGNOSIS — M25661 Stiffness of right knee, not elsewhere classified: Secondary | ICD-10-CM | POA: Diagnosis not present

## 2021-01-31 DIAGNOSIS — R2681 Unsteadiness on feet: Secondary | ICD-10-CM | POA: Diagnosis not present

## 2021-01-31 DIAGNOSIS — R2689 Other abnormalities of gait and mobility: Secondary | ICD-10-CM | POA: Diagnosis not present

## 2021-01-31 DIAGNOSIS — M6281 Muscle weakness (generalized): Secondary | ICD-10-CM

## 2021-01-31 DIAGNOSIS — R293 Abnormal posture: Secondary | ICD-10-CM | POA: Diagnosis not present

## 2021-01-31 NOTE — Therapy (Signed)
[facility] Physical Therapy [address] [place], [place], [address] Phone: [phone]   Fax:  [phone]  Physical Therapy Treatment  Patient Details  Name: Timothy Faulkner MRN: [id number] Date of Birth: [birth year] Referring Provider (PT): Monica Martinez, MD   Encounter Date: 01/31/2021   PT End of Session - 01/31/21 1603     Visit Number 24    Number of Visits 21    Date for PT Re-Evaluation 12/24/20    Authorization Type U[employer] Medicare    Progress Note Due on Visit 66    PT Start Time 1514    PT Stop Time 1555    PT Time Calculation (min) 41 min    Equipment Utilized During Treatment Gait belt    Activity Tolerance Patient tolerated treatment well;Patient limited by fatigue    Behavior During Therapy WFL for tasks assessed/performed             Past Medical History:  Diagnosis Date   Anemia    Arthritis    Gout   Automatic implantable cardioverter-defibrillator in situ    [employer]   CHF    ESRD on dialysis [facility])    [name] 03/11/2013 (03/11/2013) dialysis M/W/F   Gangrene ([place])    left foot   GERD (gastroesophageal reflux disease)    Gout    "once a year"   Heart murmur    HYPERCHOLESTEROLEMIA, MIXED    HYPERTENSION    Macular degeneration    Osteomyelitis ([place])    right foot   Other primary cardiomyopathies 07/16/2011   Pacemaker    Peripheral arterial disease (HCC)    left fifth toe ulcer, healing   Pneumonia    Shortness of breath    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 CV LAB;  Service: Cardiovascular;  Laterality: N/A;   ABDOMINAL AORTOGRAM W/LOWER EXTREMITY Bilateral 07/20/2018   Procedure: ABDOMINAL AORTOGRAM W/LOWER EXTREMITY;  Surgeon: [name], MD;  Location: [place] CV LAB;  Service: Cardiovascular;  Laterality: Bilateral;   AMPUTATION Right 09/07/2018   Procedure: Right fifth metatarsectomy;  Surgeon: [name], DPM;  Location: [place];  Service: Podiatry;  Laterality: Right;   AMPUTATION Left 03/08/2019   Procedure: AMPUTATION  3RD AND 4TH TOES LEFT FOOT;  Surgeon: [name], DPM;  Location: [place];  Service: Podiatry;  Laterality: Left;   AMPUTATION Left 03/15/2019   Procedure: AMPUTATION BELOW KNEE;  Surgeon: Elam Dutch, MD;  Location: [facility] OR;  Service: Vascular;  Laterality: Left;   AV FISTULA PLACEMENT Right 12/13/2012   Procedure: ARTERIOVENOUS (AV) FISTULA CREATION;  Surgeon: [name], MD;  Location: [place];  Service: Vascular;  Laterality: Right;  Ultrasound guided   AV FISTULA PLACEMENT Left 05/07/2016   Procedure: LEFT RADIOCEPHALIC ARTERIOVENOUS (AV) FISTULA CREATION;  Surgeon: [name], MD;  Location[facility]  OR;  Service: Vascular;  Laterality: Left;   AV FISTULA PLACEMENT Right 08/09/2019   Procedure: INSERTION OF RIGHT ARTERIOVENOUS (AV) 4-57m GORE-TEX STRETCH GRAFT RIGHT  ARM;  Surgeon: FElam Dutch MD;  [place]  Service: Vascular;  Laterality: Right;   [place] 03/26/2016   Procedure: RIGHT BASILIC VEIN TRANSPOSITION;  Surgeon: TRosetta Posner MD;  [place]  Service: Vascular;  Laterality: Right;   CARDIAC CATHETERIZATION     CARDIAC DEFIBRILLATOR PLACEMENT     [employer]   CATARACT EXTRACTION W/PHACO Bilateral    EYE SURGERY Bilateral    Cataract   FISTULOGRAM Left 04/22/2018   Procedure:  FISTULOGRAM UPPER EXTREMITY;  Surgeon: CMarty Heck MD;  [place]  Service: Vascular;  Laterality: Left;   I & D EXTREMITY Right 07/15/2018   Procedure: IRRIGATION AND DEBRIDEMENT RIGHT FOOT;  Surgeon: PEvelina Bucy DPM;  [place]  Service: Podiatry;  Laterality: Right;   INCISION AND DRAINAGE ABSCESS / HEMATOMA OF BURSA / KNEE / THIGH Left 2012   "knee" (03/11/2013)   INSERT / REPLACE / REMOVE PACEMAKER     INSERTION OF DIALYSIS CATHETER Left 04/22/2018   Procedure: INSERTION OF DIALYSIS CATHETER;  Surgeon: CMarty Heck MD;  [place]  Service: Vascular;  Laterality: Left;   IR FLUORO GUIDE CV LINE LEFT  07/15/2018   IR PTA VENOUS EXCEPT DIALYSIS CIRCUIT  07/15/2018   LOWER EXTREMITY ANGIOGRAPHY Right 07/21/2018   Procedure: LOWER EXTREMITY ANGIOGRAPHY;  Surgeon: CMarty Heck MD;  [place];  Service: Cardiovascular;  Laterality: Right;   LOWER EXTREMITY ANGIOGRAPHY Bilateral 02/25/2019   Procedure: Lower Extremity Angiography;  Surgeon: FElam Dutch MD;  [place];  Service: Cardiovascular;  Laterality: Bilateral;   METATARSAL HEAD EXCISION Right 07/15/2018   Procedure: METATARSAL RESECTION;  Surgeon: PEvelina Bucy DPM;  [place]  Service: Podiatry;  Laterality: Right;   MULTIPLE TOOTH EXTRACTIONS     PERIPHERAL VASCULAR ATHERECTOMY Right 02/28/2019   Procedure: PERIPHERAL VASCULAR ATHERECTOMY;  Surgeon: CWaynetta Sandy MD;  [facility] [place] [facility] [facility] [facility];  Service: Cardiovascular;  Laterality: Right;  right tp trunk    PERIPHERAL VASCULAR BALLOON ANGIOPLASTY Left 02/25/2019   Procedure: PERIPHERAL VASCULAR BALLOON ANGIOPLASTY;  Surgeon: FElam Dutch MD;  [place];  Service: Cardiovascular;  Laterality: Left;  tibial peroneal trunk and PT   PERIPHERAL VASCULAR INTERVENTION Right 07/21/2018   Procedure: PERIPHERAL VASCULAR INTERVENTION;  Surgeon: CMarty Heck MD;  [place];  Service: Cardiovascular;  Laterality: Right;  peroneal stents   REVISON OF ARTERIOVENOUS FISTULA Right 37/86/7672  Procedure: Plication OF Right Arm ARTERIOVENOUS FISTULA;  Surgeon: CElam Dutch MD;  [place]  Service: Vascular;  Laterality: Right;   REVISON OF ARTERIOVENOUS FISTULA Left 04/22/2018   Procedure: REVISION OF RADIOCEPHALIC ARTERIOVENOUS FISTULA;  Surgeon: CMarty Heck MD;  [place]  Service: Vascular;  Laterality: Left;   SHUNTOGRAM N/A 05/31/2013   Procedure: [place]  Surgeon: VSerafina Mitchell MD;  [facility];  Service: Cardiovascular;  Laterality: N/A;   UPPER EXTREMITY VENOGRAPHY Right 07/23/2018   Procedure: UPPER EXTREMITY VENOGRAPHY;  Surgeon: DAngelia Mould MD;  [place] [facility];  Service: Cardiovascular;  Laterality: Right;   UPPER EXTREMITY VENOGRAPHY  07/15/2019   Procedure: UPPER EXTREMITY VENOGRAPHY;  Surgeon: FElam Dutch MD;  [facility];  Service: Cardiovascular;;  rt arm    WOUND DEBRIDEMENT Right 07/17/2018   Procedure: Wound Debridement; Closure Filleted toe flap Right Foot;  Surgeon: [name], DPM;  Location: [place];  Service: Podiatry;  Laterality: Right;   WOUND DEBRIDEMENT Right 09/07/2018   Procedure: Debridement Right Foot Wound, application of wound vac;  Surgeon: [name], DPM;  Location: [place];  Service: Podiatry;  Laterality: Right;   WOUND DEBRIDEMENT Right 03/08/2019   Procedure: DEBRIDEMENT WOUND RIGHT FOOT;  Surgeon: [name], DPM;  Location: [place];  Service: Podiatry;  Laterality: Right;    There were no vitals filed for this visit.   Subjective Assessment - 01/31/21 1541     Subjective He relays no pain or complaints with his prosthesis. Denies any diffculty with donning/doffing it.    Pertinent History PAD, DM2, arthritis, gout, CHF, implanted cardioverter-defib, HTN, CKD w/ ESRD     Patient Stated Goals to use prosthesis to walk in community,             [facility] Adult PT Treatment/Exercise - 01/31/21 0001       Transfers   Transfers Sit to Stand;Stand to Sit    Sit to Stand 5: Supervision;With upper extremity assist;With armrests;From chair/3-in-1;Other (comment)    Sit to Stand Details Verbal cues for sequencing;Verbal cues for technique;Visual cues/gestures for sequencing    Stand to Sit 5: Supervision;With upper extremity assist;With armrests;To chair/3-in-1;Other (comment)      Ambulation/Gait   Ambulation/Gait Yes    Ambulation/Gait Assistance 5: Supervision    Ambulation Distance (Feet) 150 Feet   then 100   Assistive device Rolling walker;Prosthesis      High Level Balance   High Level Balance Activities Side stepping;Backward walking    High Level Balance Comments 3 round trips in bars with UE support      Knee/Hip Exercises: Stretches   Active Hamstring Stretch Both;2 reps;30 seconds    Active Hamstring Stretch Limitations at leg press      Knee/Hip Exercises: Machines for Strengthening   Cybex Leg Press BLEs 106# 3X10, with H.S stretching after each set      Knee/Hip Exercises: Standing   Forward Step Up Both;5 reps;Hand Hold: 2;Step Height: 6"    Step Down Both;1 set;5 reps;Hand Hold: 2;Step Height: 6"      Knee/Hip Exercises: Seated   Sit to Sand 3 sets;5 reps;without UE support   from bar stool, did not need any assistance today                     PT Short Term Goals - 01/22/21 1513       PT SHORT TERM GOAL #1   Title Patient verbalizes understanding of signs of sweat & drying limb/liner    Baseline MEt 01/17/21    Time 4    Period Weeks    Status New    Target Date 02/14/21      PT SHORT TERM GOAL #2   Title Patient standing balance scans & reaches 5" without UE support with supervision.    Time 4    Period Weeks    Status Revised    Target Date 02/14/21      PT SHORT TERM GOAL #3   Title Patient sit to/from  stand with chairs without armrests to RW with supervision.    Time 4    Period Weeks    Status New    Target Date 02/14/21  PT SHORT TERM GOAL #4   Title Patient ambulates 64' with RW & prosthesis with supervision.    Time 4    Period Weeks    Status Revised    Target Date 02/14/21      PT SHORT TERM GOAL #5   Title Patient negotiates ramps & curbs with RW & prostheis with supervision.    Time 4    Period Weeks    Status Revised    Target Date 02/14/21               PT Long Term Goals - 12/18/20 1658       PT LONG TERM GOAL #1   Title Patient & family verbalize & demonstrate understanding of proper prosthetic care to enable utilization of prosthesis.    Time 12    Period Weeks    Status On-going    Target Date 03/14/21      PT LONG TERM GOAL #2   Title Patient tolerates wear of prosthesis >90% of awake hours without skin or limb discomfort issues.    Time 12    Period Weeks    Status On-going    Target Date 03/14/21      PT LONG TERM GOAL #3   Title Tasks of Oceanographer with RW  >/= 45/56    Time 12    Period Weeks    Status Revised    Target Date 03/14/21      PT LONG TERM GOAL #4   Title Patient ambulates 250' with RW & prosthesis modified independent.    Time 12    Period Weeks    Status Revised    Target Date 03/14/21      PT LONG TERM GOAL #5   Title Patient negotiates ramps & curbs with RW & prosthesis with supervision    Time 12    Period Weeks    Status Revised    Target Date 03/14/21                   Plan - 01/31/21 1610     Clinical Impression Statement he is showing improved overall strength, endurance, and walking tolerance. He does relay he has goal to walk without RW so we will continue to work on balance, strength, and progressing gait with Least restrictive assistive device as able however he will continue to need RW at this time for safety.    Personal Factors and Comorbidities Comorbidity 3+;Fitness;Time since  onset of injury/illness/exacerbation    Comorbidities PAD, DM2, arthritis, gout, CHF, implanted cardioverter-defib, HTN, CKD w/ ESRD, blind left knee    Examination-Activity Limitations Locomotion Level;Stairs;Stand;Transfers    Examination-Participation Restrictions Community Activity    Stability/Clinical Decision Making Evolving/Moderate complexity    Rehab Potential Good    PT Frequency 2x / week    PT Duration 12 weeks    PT Treatment/Interventions ADLs/Self Care Home Management;DME Instruction;Gait training;Stair training;Functional mobility training;Therapeutic activities;Therapeutic exercise;Balance training;Neuromuscular re-education;Patient/family education;Prosthetic Training;Manual techniques;Vestibular    PT Next Visit Plan work towards updated STGs, progress as tolerated prosthetic gait with RW including ramps & curbs,  exercise for strength, balance, flexibility & endurance.    Consulted and Agree with Plan of Care Patient;Family member/caregiver    Family Member Consulted daughter,Sherry Cira Servant             Patient will benefit from skilled therapeutic intervention in order to improve the following deficits and impairments:  Abnormal gait, Cardiopulmonary status limiting activity, Decreased activity tolerance, Decreased  balance, Decreased endurance, Decreased knowledge of use of DME, Decreased mobility, Decreased range of motion, Decreased scar mobility, Decreased strength, Increased edema, Postural dysfunction, Prosthetic Dependency  Visit Diagnosis: Muscle weakness (generalized)  Stiffness of right knee, not elsewhere classified  Other abnormalities of gait and mobility  Unsteadiness on feet  Abnormal posture     Problem List Patient Active Problem List   Diagnosis Date Noted   Polycythemia, secondary 02/21/2020   Stable treated proliferative diabetic retinopathy of right eye determined by examination associated with type 2 diabetes mellitus ([place]) 11/29/2019    Stable treated proliferative diabetic retinopathy of left eye without macular edema determined by examination associated with type 2 diabetes mellitus ([place]) 11/29/2019   Advanced nonexudative age-related macular degeneration of both eyes with subfoveal involvement 11/29/2019   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   Sepsis ([place]) 04/14/2019   Hypocalcemia 04/08/2019   Drug induced constipation    Anemia of chronic disease    ESRD on dialysis ([place])    Postoperative pain    Phantom limb pain (HCC)    S/P BKA (below knee amputation), left ([place]) 03/18/2019   Unilateral complete BKA, left, initial encounter ([place]) 03/17/2019   Critical lower limb ischemia ([place]) 03/12/2019   Infection of amputation stump, right lower extremity ([place]) 03/10/2019   Hypotension 02/23/2019   Cellulitis of left foot 02/23/2019   Gangrene of toe of left foot ([place]) 14/48/1856   Complication of vascular dialysis catheter 11/24/2018   Chronic osteomyelitis involving right ankle and foot ([place])    Ulcer of right foot with fat layer exposed ([place]) 09/02/2018   Encounter for planned post-operative wound closure    Acute osteomyelitis of metatarsal bone of right foot ([place])    Diabetic ulcer of midfoot associated with diabetes mellitus due to underlying condition, with necrosis of bone ([place])    Osteomyelitis of right foot ([place])    Vascular calcification    Cellulitis and abscess of foot, except toes    Anemia associated with chronic renal failure 07/13/2018   Cardiomyopathy, dilated, nonischemic ([place]) 07/13/2018   Diabetic foot infection ([place]) 07/13/2018   PAD (peripheral artery disease) ([place])    Macular degeneration    Ventricular tachycardia ([place]) 06/24/2017   Embolism due to vascular prosthetic devices, implants and grafts, initial encounter ([place]) 02/04/2016   Dependence on renal dialysis ([place]) 05/30/2015   Shortness of breath 31/49/7026   Chronic systolic heart failure ([place]) 08/22/2014   Diabetic  infection of right foot ([place]) 07/13/2014   Type 2 diabetes mellitus with diabetic peripheral angiopathy without gangrene ([place]) 03/18/2014   Anxiety disorder, unspecified 02/23/2014   Fever, unspecified 02/23/2014   Headache 02/23/2014   Pain, unspecified 02/23/2014   Pruritus, unspecified 02/23/2014   Mild protein-calorie malnutrition ([place]) 12/26/2013   ESRD (end stage renal disease) on dialysis ([place]) 11/11/2013   Snoring 11/11/2013   Unspecified sleep apnea 11/11/2013   Other pancytopenia ([place]) 09/22/2013   Hemoptysis 09/20/2013   Personal history of nicotine dependence 07/12/2013   Coagulation defect, unspecified ([place]) 05/16/2013   Iron deficiency anemia, unspecified 05/06/2013   Other disorders of plasma-protein metabolism, not elsewhere classified 04/15/2013   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 chronic kidney disease or end stage renal disease ([place]) 03/18/2013   Other cardiomyopathies ([place]) 03/18/2013   Secondary hyperparathyroidism of renal origin ([place]) 03/18/2013   Type 2 diabetes mellitus without complications ([place]) 37/85/8850   Pre-transplant evaluation for kidney transplant 01/11/2013   Automatic implantable cardioverter-defibrillator in situ 10/01/2010   HYPERCHOLESTEROLEMIA,  MIXED 05/03/2010   Essential hypertension 05/03/2010   CHF 05/03/2010    Silvestre Mesi 01/31/2021, 4:13 PM  [facility] Physical Therapy [address] [place], [place], [address] Phone: [phone]   Fax:  [phone]  Name: Timothy Faulkner MRN: [id number] Date of Birth: October 08, 1945

## 2021-02-01 DIAGNOSIS — D689 Coagulation defect, unspecified: Secondary | ICD-10-CM | POA: Diagnosis not present

## 2021-02-01 DIAGNOSIS — Z992 Dependence on renal dialysis: Secondary | ICD-10-CM | POA: Diagnosis not present

## 2021-02-01 DIAGNOSIS — N186 End stage renal disease: Secondary | ICD-10-CM | POA: Diagnosis not present

## 2021-02-01 DIAGNOSIS — N2581 Secondary hyperparathyroidism of renal origin: Secondary | ICD-10-CM | POA: Diagnosis not present

## 2021-02-04 DIAGNOSIS — N186 End stage renal disease: Secondary | ICD-10-CM | POA: Diagnosis not present

## 2021-02-04 DIAGNOSIS — N2581 Secondary hyperparathyroidism of renal origin: Secondary | ICD-10-CM | POA: Diagnosis not present

## 2021-02-04 DIAGNOSIS — Z992 Dependence on renal dialysis: Secondary | ICD-10-CM | POA: Diagnosis not present

## 2021-02-04 DIAGNOSIS — D689 Coagulation defect, unspecified: Secondary | ICD-10-CM | POA: Diagnosis not present

## 2021-02-05 ENCOUNTER — Other Ambulatory Visit: Payer: Self-pay

## 2021-02-05 ENCOUNTER — Encounter: Payer: Self-pay | Admitting: Physical Therapy

## 2021-02-05 ENCOUNTER — Ambulatory Visit (INDEPENDENT_AMBULATORY_CARE_PROVIDER_SITE_OTHER): Payer: Medicare Other | Admitting: Physical Therapy

## 2021-02-05 DIAGNOSIS — M6281 Muscle weakness (generalized): Secondary | ICD-10-CM

## 2021-02-05 DIAGNOSIS — M25661 Stiffness of right knee, not elsewhere classified: Secondary | ICD-10-CM

## 2021-02-05 DIAGNOSIS — R2681 Unsteadiness on feet: Secondary | ICD-10-CM

## 2021-02-05 DIAGNOSIS — R293 Abnormal posture: Secondary | ICD-10-CM | POA: Diagnosis not present

## 2021-02-05 DIAGNOSIS — R2689 Other abnormalities of gait and mobility: Secondary | ICD-10-CM

## 2021-02-05 NOTE — Therapy (Signed)
[facility] Physical Therapy [address] [place], [place], [address] Phone: [phone]   Fax:  [phone]  Physical Therapy Treatment  Patient Details  Name: Timothy Faulkner MRN: [id number] Date of Birth: Jun 10, 1946 Referring Provider (PT): Monica Martinez, MD   Encounter Date: 02/05/2021   PT End of Session - 02/05/21 1446     Visit Number 25    Number of Visits 64    Date for PT Re-Evaluation 12/24/20    Authorization Type UHC Medicare    Progress Note Due on Visit 30    PT Start Time 1430    PT Stop Time 1516    PT Time Calculation (min) 46 min    Equipment Utilized During Treatment Gait belt    Activity Tolerance Patient tolerated treatment well    Behavior During Therapy WFL for tasks assessed/performed             Past Medical History:  Diagnosis Date   Anemia    Arthritis    Gout   Automatic implantable cardioverter-defibrillator in situ    [employer]   CHF    ESRD on dialysis [facility])    [name] 03/11/2013 (03/11/2013) dialysis M/W/F   Gangrene ([place])    left foot   GERD (gastroesophageal reflux disease)    Gout    "once a year"   Heart murmur    HYPERCHOLESTEROLEMIA, MIXED    HYPERTENSION    Macular degeneration    Osteomyelitis ([place])    right foot   Other primary cardiomyopathies 07/16/2011   Pacemaker    Peripheral arterial disease (HCC)    left fifth toe ulcer, healing   Pneumonia    Shortness of breath    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 ([place])    left fifth toe   Wears dentures    Wears glasses     Past Surgical History:  Procedure Laterality Date   A/V FISTULAGRAM Left 07/20/2018   Procedure: A/V FISTULAGRAM;  Surgeon: [name], MD;  Location: [place] [facility];  Service: Cardiovascular;  Laterality: Left;   A/V FISTULAGRAM N/A 07/15/2019   Procedure: A/V FISTULAGRAM - Left Arm;  Surgeon: Elam Dutch, MD;  Location: [place] [facility];  Service: Cardiovascular;  Laterality:  N/A;   ABDOMINAL AORTOGRAM N/A 02/25/2019   Procedure: ABDOMINAL AORTOGRAM;  Surgeon: Elam Dutch, MD;  Location: [place] [facility];  Service: Cardiovascular;  Laterality: N/A;   ABDOMINAL AORTOGRAM W/LOWER EXTREMITY Bilateral 07/20/2018   Procedure: ABDOMINAL AORTOGRAM W/LOWER EXTREMITY;  Surgeon: [name], MD;  Location: [place] [facility];  Service: Cardiovascular;  Laterality: Bilateral;   AMPUTATION Right 09/07/2018   Procedure: Right fifth metatarsectomy;  Surgeon: [name], DPM;  Location: [place];  Service: Podiatry;  Laterality: Right;   AMPUTATION Left 03/08/2019   Procedure: AMPUTATION  3RD AND 4TH TOES LEFT FOOT;  Surgeon: [name], DPM;  Location: [place];  Service: Podiatry;  Laterality: Left;   AMPUTATION Left 03/15/2019   Procedure: AMPUTATION BELOW KNEE;  Surgeon: Elam Dutch, MD;  Location[facility] OR;  Service: Vascular;  Laterality: Left;   AV FISTULA PLACEMENT Right 12/13/2012   Procedure: ARTERIOVENOUS (AV) FISTULA CREATION;  Surgeon: [name], MD;  Location: [place];  Service: Vascular;  Laterality: Right;  Ultrasound guided   AV FISTULA PLACEMENT Left 05/07/2016   Procedure: LEFT RADIOCEPHALIC ARTERIOVENOUS (AV) FISTULA CREATION;  Surgeon: [name], MD;  Location: [place];  Service:  Vascular;  Laterality: Left;   AV FISTULA PLACEMENT Right 08/09/2019   Procedure: INSERTION OF RIGHT ARTERIOVENOUS (AV) 4-56m GORE-TEX STRETCH GRAFT RIGHT  ARM;  Surgeon: FElam Dutch MD;  [place]  Service: Vascular;  Laterality: Right;   [place] 03/26/2016   Procedure: RIGHT BASILIC VEIN TRANSPOSITION;  Surgeon: TRosetta Posner MD;  [place]  Service: Vascular;  Laterality: Right;   CARDIAC CATHETERIZATION     CARDIAC DEFIBRILLATOR PLACEMENT     [employer]   CATARACT EXTRACTION W/PHACO Bilateral    EYE SURGERY Bilateral    Cataract   FISTULOGRAM Left 04/22/2018   Procedure: FISTULOGRAM UPPER EXTREMITY;  Surgeon:  CMarty Heck MD;  [place]  Service: Vascular;  Laterality: Left;   I & D EXTREMITY Right 07/15/2018   Procedure: IRRIGATION AND DEBRIDEMENT RIGHT FOOT;  Surgeon: PEvelina Bucy DPM;  [place]  Service: Podiatry;  Laterality: Right;   INCISION AND DRAINAGE ABSCESS / HEMATOMA OF BURSA / KNEE / THIGH Left 2012   "knee" (03/11/2013)   INSERT / REPLACE / REMOVE PACEMAKER     INSERTION OF DIALYSIS CATHETER Left 04/22/2018   Procedure: INSERTION OF DIALYSIS CATHETER;  Surgeon: CMarty Heck MD;  [facility]  Service: Vascular;  Laterality: Left;   IR FLUORO GUIDE CV LINE LEFT  07/15/2018   IR PTA VENOUS EXCEPT DIALYSIS CIRCUIT  07/15/2018   LOWER EXTREMITY ANGIOGRAPHY Right 07/21/2018   Procedure: LOWER EXTREMITY ANGIOGRAPHY;  Surgeon: CMarty Heck MD;  [place];  Service: Cardiovascular;  Laterality: Right;   LOWER EXTREMITY ANGIOGRAPHY Bilateral 02/25/2019   Procedure: Lower Extremity Angiography;  Surgeon: FElam Dutch MD;  [place];  Service: Cardiovascular;  Laterality: Bilateral;   METATARSAL HEAD EXCISION Right 07/15/2018   Procedure: METATARSAL RESECTION;  Surgeon: PEvelina Bucy DPM;  [place]  Service: Podiatry;  Laterality: Right;   MULTIPLE TOOTH EXTRACTIONS     PERIPHERAL VASCULAR ATHERECTOMY Right 02/28/2019   Procedure: PERIPHERAL VASCULAR ATHERECTOMY;  Surgeon: CWaynetta Sandy MD;  [place];  Service: Cardiovascular;  Laterality: Right;  right tp trunk    PERIPHERAL VASCULAR BALLOON ANGIOPLASTY Left 02/25/2019   Procedure: PERIPHERAL VASCULAR BALLOON ANGIOPLASTY;  Surgeon: FElam Dutch MD;  [place];  Service: Cardiovascular;  Laterality: Left;  tibial peroneal trunk and PT   PERIPHERAL VASCULAR INTERVENTION Right 07/21/2018   Procedure: PERIPHERAL VASCULAR INTERVENTION;  Surgeon: CMarty Heck MD;  [place];  Service: Cardiovascular;  Laterality: Right;  peroneal stents   REVISON OF ARTERIOVENOUS FISTULA Right 38/29/5621  Procedure: Plication OF Right Arm ARTERIOVENOUS FISTULA;  Surgeon: CElam Dutch MD;  [place]  Service: Vascular;  Laterality: Right;   REVISON OF ARTERIOVENOUS FISTULA Left 04/22/2018   Procedure: REVISION OF RADIOCEPHALIC ARTERIOVENOUS FISTULA;  Surgeon: CMarty Heck MD;  [place]  Service: Vascular;  Laterality: Left;   SHUNTOGRAM N/A 05/31/2013   Procedure: SEarney Mallet  Surgeon: VSerafina Mitchell MD;  [facility];  Service: Cardiovascular;  Laterality: N/A;   UPPER EXTREMITY VENOGRAPHY Right 07/23/2018   Procedure: UPPER EXTREMITY VENOGRAPHY;  Surgeon: DAngelia Mould MD;  [place];  Service: Cardiovascular;  Laterality: Right;   UPPER EXTREMITY VENOGRAPHY  07/15/2019   Procedure: UPPER EXTREMITY VENOGRAPHY;  Surgeon: FElam Dutch MD;  [place];  Service: Cardiovascular;;  rt arm    WOUND DEBRIDEMENT Right 07/17/2018   Procedure: Wound Debridement; Closure Filleted toe flap Right Foot;  Surgeon: [name], DPM;  Location: [place];  Service: Podiatry;  Laterality: Right;   WOUND DEBRIDEMENT Right 09/07/2018   Procedure: Debridement Right Foot Wound, application of wound vac;  Surgeon: [name], DPM;  Location: [place];  Service: Podiatry;  Laterality: Right;   WOUND DEBRIDEMENT Right 03/08/2019   Procedure: DEBRIDEMENT WOUND RIGHT FOOT;  Surgeon: [name], DPM;  Location: [place];  Service: Podiatry;  Laterality: Right;    There were no vitals filed for this visit.   Subjective Assessment - 02/05/21 1430     Subjective He says that doctor wants him to wear cast shoe for 4 more weeks until small wound heals.    Pertinent History PAD, DM2, arthritis, gout, CHF, implanted cardioverter-defib, HTN, CKD w/ ESRD    Patient Stated Goals to use prosthesis to walk in  community,    Currently in Pain? No/denies                               [facility] Adult PT Treatment/Exercise - 02/05/21 1430       Transfers   Transfers Sit to Stand;Stand to Sit    Sit to Stand 5: Supervision;With upper extremity assist;With armrests;From chair/3-in-1;Other (comment)    Sit to Stand Details Verbal cues for sequencing;Verbal cues for technique;Visual cues/gestures for sequencing    Stand to Sit 5: Supervision;With upper extremity assist;With armrests;To chair/3-in-1;Other (comment)      Ambulation/Gait   Ambulation/Gait Yes    Ambulation/Gait Assistance 5: Supervision    Ambulation/Gait Assistance Details verbal & tactile cues on upright posture - looking forward not staring at floor    Ambulation Distance (Feet) 180 Feet   180' then 37' + ramp & curb   Assistive device Rolling walker;Prosthesis    Ramp 4: Min assist   RW & TTA prosthesis   Ramp Details (indicate cue type and reason) verbal & tactile cues on technique    Curb 4: Min assist   min guard RW & TTA prosthesis   Curb Details (indicate cue type and reason) verbal & tactile cues on technique    Gait Comments endurance pt amb 70' + ramp & curb before seated rest.      High Level Balance   High Level Balance Activities Side stepping;Backward walking   //bars   High Level Balance Comments 3 round trips in bars with UE support      Neuro Re-ed    Neuro Re-ed Details  --      Knee/Hip Exercises: Stretches   Active Hamstring Stretch Both;2 reps;30 seconds   AAROM   Active Hamstring Stretch Limitations supine SLR with strap bw sets on leg press      Knee/Hip Exercises: Machines for Strengthening   Cybex Leg Press --      Knee/Hip Exercises: Standing   Forward Step Up Both;5 reps;Hand Hold: 2;Step Height: 6"    Forward Step Up Limitations verbal & tactile cues for technique    Step Down Both;1 set;5 reps;Hand Hold: 2;Step Height: 6"    Step Down Limitations verbal & tactile cues for  technique    Rocker Board 1 minute   BUE support //bars, ant/level/post & right/level/left   Rocker Board Limitations verbal & tactile cues for technique      Knee/Hip Exercises: Seated  Sit to Sand 5 reps;without UE support;2 sets   from 24 inch barstool, goal to not touch //bars to stabilize     Prosthetics   Prosthetic Care Comments  --    Current prosthetic wear tolerance (days/week)  daily including dialysis    Current prosthetic wear tolerance (#hours/day)  ~90% of awake hours    Education Provided --   see prosthetic care comments                Balance Exercises - 02/05/21 1430       Balance Exercises: Standing   Standing Eyes Opened Wide ([place]);Solid surface;5 reps;Head turns   minA, head turns 4 directions   Standing Eyes Opened Limitations manual/tactile, visual (mirror) and verbal cues                 PT Short Term Goals - 01/22/21 1513       PT SHORT TERM GOAL #1   Title Patient verbalizes understanding of signs of sweat & drying limb/liner    Baseline MEt 01/17/21    Time 4    Period Weeks    Status New    Target Date 02/14/21      PT SHORT TERM GOAL #2   Title Patient standing balance scans & reaches 5" without UE support with supervision.    Time 4    Period Weeks    Status Revised    Target Date 02/14/21      PT SHORT TERM GOAL #3   Title Patient sit to/from stand with chairs without armrests to RW with supervision.    Time 4    Period Weeks    Status New    Target Date 02/14/21      PT SHORT TERM GOAL #4   Title Patient ambulates 54' with RW & prosthesis with supervision.    Time 4    Period Weeks    Status Revised    Target Date 02/14/21      PT SHORT TERM GOAL #5   Title Patient negotiates ramps & curbs with RW & prostheis with supervision.    Time 4    Period Weeks    Status Revised    Target Date 02/14/21               PT Long Term Goals - 12/18/20 1658       PT LONG TERM GOAL #1   Title Patient & family  verbalize & demonstrate understanding of proper prosthetic care to enable utilization of prosthesis.    Time 12    Period Weeks    Status On-going    Target Date 03/14/21      PT LONG TERM GOAL #2   Title Patient tolerates wear of prosthesis >90% of awake hours without skin or limb discomfort issues.    Time 12    Period Weeks    Status On-going    Target Date 03/14/21      PT LONG TERM GOAL #3   Title Tasks of Oceanographer with RW  >/= 45/56    Time 12    Period Weeks    Status Revised    Target Date 03/14/21      PT LONG TERM GOAL #4   Title Patient ambulates 250' with RW & prosthesis modified independent.    Time 12    Period Weeks    Status Revised    Target Date 03/14/21      PT LONG TERM GOAL #5  Title Patient negotiates ramps & curbs with RW & prosthesis with supervision    Time 12    Period Weeks    Status Revised    Target Date 03/14/21                   Plan - 02/05/21 1446     Clinical Impression Statement Patient increased distance able to ambulate before fatigue.  PT worked on standing balance facilitating righting reactions.  Patient wants to start trying cane but PT recommended not starting this work when wearing cast shoe.    Personal Factors and Comorbidities Comorbidity 3+;Fitness;Time since onset of injury/illness/exacerbation    Comorbidities PAD, DM2, arthritis, gout, CHF, implanted cardioverter-defib, HTN, CKD w/ ESRD, blind left knee    Examination-Activity Limitations Locomotion Level;Stairs;Stand;Transfers    Examination-Participation Restrictions Community Activity    Stability/Clinical Decision Making Evolving/Moderate complexity    Rehab Potential Good    PT Frequency 2x / week    PT Duration 12 weeks    PT Treatment/Interventions ADLs/Self Care Home Management;DME Instruction;Gait training;Stair training;Functional mobility training;Therapeutic activities;Therapeutic exercise;Balance training;Neuromuscular  re-education;Patient/family education;Prosthetic Training;Manual techniques;Vestibular    PT Next Visit Plan work towards updated STGs, progress as tolerated prosthetic gait with RW including ramps & curbs,  exercise for strength, balance, flexibility & endurance.    Consulted and Agree with Plan of Care Patient    Family Member Consulted --             Patient will benefit from skilled therapeutic intervention in order to improve the following deficits and impairments:  Abnormal gait, Cardiopulmonary status limiting activity, Decreased activity tolerance, Decreased balance, Decreased endurance, Decreased knowledge of use of DME, Decreased mobility, Decreased range of motion, Decreased scar mobility, Decreased strength, Increased edema, Postural dysfunction, Prosthetic Dependency  Visit Diagnosis: Muscle weakness (generalized)  Stiffness of right knee, not elsewhere classified  Other abnormalities of gait and mobility  Unsteadiness on feet  Abnormal posture     Problem List Patient Active Problem List   Diagnosis Date Noted   Polycythemia, secondary 02/21/2020   Stable treated proliferative diabetic retinopathy of right eye determined by examination associated with type 2 diabetes mellitus ([place]) 11/29/2019   Stable treated proliferative diabetic retinopathy of left eye without macular edema determined by examination associated with type 2 diabetes mellitus ([place]) 11/29/2019   Advanced nonexudative age-related macular degeneration of both eyes with subfoveal involvement 11/29/2019   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   Sepsis ([place]) 04/14/2019   Hypocalcemia 04/08/2019   Drug induced constipation    Anemia of chronic disease    ESRD on dialysis ([place])    Postoperative pain    Phantom limb pain (HCC)    S/P BKA (below knee amputation), left ([place]) 03/18/2019   Unilateral complete BKA, left, initial encounter ([place]) 03/17/2019   Critical lower limb ischemia ([place])  03/12/2019   Infection of amputation stump, right lower extremity ([place]) 03/10/2019   Hypotension 02/23/2019   Cellulitis of left foot 02/23/2019   Gangrene of toe of left foot ([place]) 25/63/8937   Complication of vascular dialysis catheter 11/24/2018   Chronic osteomyelitis involving right ankle and foot ([place])    Ulcer of right foot with fat layer exposed ([place]) 09/02/2018   Encounter for planned post-operative wound closure    Acute osteomyelitis of metatarsal bone of right foot ([place])    Diabetic ulcer of midfoot associated with diabetes mellitus due to underlying condition, with necrosis of bone (HCC)    Osteomyelitis of right  foot [facility])    Vascular calcification    Cellulitis and abscess of foot, except toes    Anemia associated with chronic renal failure 07/13/2018   Cardiomyopathy, dilated, nonischemic ([place]) 07/13/2018   Diabetic foot infection ([place]) 07/13/2018   PAD (peripheral artery disease) ([place])    Macular degeneration    Ventricular tachycardia ([place]) 06/24/2017   Embolism due to vascular prosthetic devices, implants and grafts, initial encounter ([place]) 02/04/2016   Dependence on renal dialysis ([place]) 05/30/2015   Shortness of breath 40/98/2867   Chronic systolic heart failure ([place]) 08/22/2014   Diabetic infection of right foot ([place]) 07/13/2014   Type 2 diabetes mellitus with diabetic peripheral angiopathy without gangrene ([place]) 03/18/2014   Anxiety disorder, unspecified 02/23/2014   Fever, unspecified 02/23/2014   Headache 02/23/2014   Pain, unspecified 02/23/2014   Pruritus, unspecified 02/23/2014   Mild protein-calorie malnutrition ([place]) 12/26/2013   ESRD (end stage renal disease) on dialysis ([place]) 11/11/2013   Snoring 11/11/2013   Unspecified sleep apnea 11/11/2013   Other pancytopenia ([place]) 09/22/2013   Hemoptysis 09/20/2013   Personal history of nicotine dependence 07/12/2013   Coagulation defect, unspecified ([place]) 05/16/2013   Iron deficiency anemia, unspecified  05/06/2013   Other disorders of plasma-protein metabolism, not elsewhere classified 04/15/2013   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 chronic kidney disease or end stage renal disease ([place]) 03/18/2013   Other cardiomyopathies ([place]) 03/18/2013   Secondary hyperparathyroidism of renal origin ([place]) 03/18/2013   Type 2 diabetes mellitus without complications ([place]) 51/98/2429   Pre-transplant evaluation for kidney transplant 01/11/2013   Automatic implantable cardioverter-defibrillator in situ 10/01/2010   HYPERCHOLESTEROLEMIA, MIXED 05/03/2010   Essential hypertension 05/03/2010   CHF 05/03/2010    Jamey Reas, PT, DPT 02/05/2021, 3:32 PM  [facility] Physical Therapy [address] [place], [place], [address] Phone: [phone]   Fax:  [phone]  Name: MIHCAEL LEDEE MRN: [id number] Date of Birth: 12-18-1945

## 2021-02-06 DIAGNOSIS — D689 Coagulation defect, unspecified: Secondary | ICD-10-CM | POA: Diagnosis not present

## 2021-02-06 DIAGNOSIS — Z992 Dependence on renal dialysis: Secondary | ICD-10-CM | POA: Diagnosis not present

## 2021-02-06 DIAGNOSIS — N2581 Secondary hyperparathyroidism of renal origin: Secondary | ICD-10-CM | POA: Diagnosis not present

## 2021-02-06 DIAGNOSIS — N186 End stage renal disease: Secondary | ICD-10-CM | POA: Diagnosis not present

## 2021-02-07 ENCOUNTER — Ambulatory Visit (INDEPENDENT_AMBULATORY_CARE_PROVIDER_SITE_OTHER): Payer: Medicare Other | Admitting: Physical Therapy

## 2021-02-07 ENCOUNTER — Other Ambulatory Visit: Payer: Self-pay

## 2021-02-07 DIAGNOSIS — R293 Abnormal posture: Secondary | ICD-10-CM | POA: Diagnosis not present

## 2021-02-07 DIAGNOSIS — M6281 Muscle weakness (generalized): Secondary | ICD-10-CM | POA: Diagnosis not present

## 2021-02-07 DIAGNOSIS — R2681 Unsteadiness on feet: Secondary | ICD-10-CM

## 2021-02-07 DIAGNOSIS — M25661 Stiffness of right knee, not elsewhere classified: Secondary | ICD-10-CM | POA: Diagnosis not present

## 2021-02-07 DIAGNOSIS — R2689 Other abnormalities of gait and mobility: Secondary | ICD-10-CM | POA: Diagnosis not present

## 2021-02-07 DIAGNOSIS — I12 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 chronic kidney disease or end stage renal disease: Secondary | ICD-10-CM | POA: Diagnosis not present

## 2021-02-07 DIAGNOSIS — N186 End stage renal disease: Secondary | ICD-10-CM | POA: Diagnosis not present

## 2021-02-07 DIAGNOSIS — Z992 Dependence on renal dialysis: Secondary | ICD-10-CM | POA: Diagnosis not present

## 2021-02-07 NOTE — Therapy (Signed)
[facility] Physical Therapy [address] [place], [place], [address] Phone: [phone]   Fax:  [phone]  Physical Therapy Treatment  Patient Details  Name: Timothy Faulkner MRN: [id number] Date of Birth: February 04, 1946 Referring Provider (PT): Monica Martinez, MD   Encounter Date: 02/07/2021   PT End of Session - 02/07/21 1324     Visit Number 26    Number of Visits 52    Date for PT Re-Evaluation 12/24/20    Authorization Type [employer] Medicare    Progress Note Due on Visit 45    PT Start Time 1515    PT Stop Time 1559    PT Time Calculation (min) 44 min    Equipment Utilized During Treatment Gait belt    Activity Tolerance Patient tolerated treatment well    Behavior During Therapy WFL for tasks assessed/performed             Past Medical History:  Diagnosis Date   Anemia    Arthritis    Gout   Automatic implantable cardioverter-defibrillator in situ    [employer]   CHF    ESRD on dialysis [facility])    [name] 03/11/2013 (03/11/2013) dialysis M/W/F   Gangrene ([place])    left foot   GERD (gastroesophageal reflux disease)    Gout    "once a year"   Heart murmur    HYPERCHOLESTEROLEMIA, MIXED    HYPERTENSION    Macular degeneration    Osteomyelitis ([place])    right foot   Other primary cardiomyopathies 07/16/2011   Pacemaker    Peripheral arterial disease (HCC)    left fifth toe ulcer, healing   Pneumonia    Shortness of breath    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 ([place])    left fifth toe   Wears dentures    Wears glasses     Past Surgical History:  Procedure Laterality Date   A/V FISTULAGRAM Left 07/20/2018   Procedure: A/V FISTULAGRAM;  Surgeon: [name], MD;  Location: [place] CV [facility];  Service: Cardiovascular;  Laterality: Left;   A/V FISTULAGRAM N/A 07/15/2019   Procedure: A/V FISTULAGRAM - Left Arm;  Surgeon: Elam Dutch, MD;  Location: [place] [facility];  Service: Cardiovascular;  Laterality:  N/A;   ABDOMINAL AORTOGRAM N/A 02/25/2019   Procedure: ABDOMINAL AORTOGRAM;  Surgeon: Elam Dutch, MD;  Location: [place] [facility];  Service: Cardiovascular;  Laterality: N/A;   ABDOMINAL AORTOGRAM W/LOWER EXTREMITY Bilateral 07/20/2018   Procedure: ABDOMINAL AORTOGRAM W/LOWER EXTREMITY;  Surgeon: [name], MD;  Location: [place] [facility];  Service: Cardiovascular;  Laterality: Bilateral;   AMPUTATION Right 09/07/2018   Procedure: Right fifth metatarsectomy;  Surgeon: [name], DPM;  Location: [place];  Service: Podiatry;  Laterality: Right;   AMPUTATION Left 03/08/2019   Procedure: AMPUTATION  3RD AND 4TH TOES LEFT FOOT;  Surgeon: [name], DPM;  Location: [place];  Service: Podiatry;  Laterality: Left;   AMPUTATION Left 03/15/2019   Procedure: AMPUTATION BELOW KNEE;  Surgeon: Elam Dutch, MD;  Location: [facility] OR;  Service: Vascular;  Laterality: Left;   AV FISTULA PLACEMENT Right 12/13/2012   Procedure: ARTERIOVENOUS (AV) FISTULA CREATION;  Surgeon: [name], MD;  Location: [place];  Service: Vascular;  Laterality: Right;  Ultrasound guided   AV FISTULA PLACEMENT Left 05/07/2016   Procedure: LEFT RADIOCEPHALIC ARTERIOVENOUS (AV) FISTULA CREATION;  Surgeon: [name], MD;  Location: [place];  Service:  Vascular;  Laterality: Left;   AV FISTULA PLACEMENT Right 08/09/2019   Procedure: INSERTION OF RIGHT ARTERIOVENOUS (AV) 4-56m GORE-TEX STRETCH GRAFT RIGHT  ARM;  Surgeon: FElam Dutch MD;  [place]  Service: Vascular;  Laterality: Right;   [place] 03/26/2016   Procedure: RIGHT BASILIC VEIN TRANSPOSITION;  Surgeon: TRosetta Posner MD;  [place]  Service: Vascular;  Laterality: Right;   CARDIAC CATHETERIZATION     CARDIAC DEFIBRILLATOR PLACEMENT     [employer]   CATARACT EXTRACTION W/PHACO Bilateral    EYE SURGERY Bilateral    Cataract   FISTULOGRAM Left 04/22/2018   Procedure: FISTULOGRAM UPPER EXTREMITY;  Surgeon:  CMarty Heck MD;  [place]  Service: Vascular;  Laterality: Left;   I & D EXTREMITY Right 07/15/2018   Procedure: IRRIGATION AND DEBRIDEMENT RIGHT FOOT;  Surgeon: PEvelina Bucy DPM;  [place]  Service: Podiatry;  Laterality: Right;   INCISION AND DRAINAGE ABSCESS / HEMATOMA OF BURSA / KNEE / THIGH Left 2012   "knee" (03/11/2013)   INSERT / REPLACE / REMOVE PACEMAKER     INSERTION OF DIALYSIS CATHETER Left 04/22/2018   Procedure: INSERTION OF DIALYSIS CATHETER;  Surgeon: CMarty Heck MD;  [facility]  Service: Vascular;  Laterality: Left;   IR FLUORO GUIDE CV LINE LEFT  07/15/2018   IR PTA VENOUS EXCEPT DIALYSIS CIRCUIT  07/15/2018   LOWER EXTREMITY ANGIOGRAPHY Right 07/21/2018   Procedure: LOWER EXTREMITY ANGIOGRAPHY;  Surgeon: CMarty Heck MD;  [place];  Service: Cardiovascular;  Laterality: Right;   LOWER EXTREMITY ANGIOGRAPHY Bilateral 02/25/2019   Procedure: Lower Extremity Angiography;  Surgeon: FElam Dutch MD;  [place];  Service: Cardiovascular;  Laterality: Bilateral;   METATARSAL HEAD EXCISION Right 07/15/2018   Procedure: METATARSAL RESECTION;  Surgeon: PEvelina Bucy DPM;  [place]  Service: Podiatry;  Laterality: Right;   MULTIPLE TOOTH EXTRACTIONS     PERIPHERAL VASCULAR ATHERECTOMY Right 02/28/2019   Procedure: PERIPHERAL VASCULAR ATHERECTOMY;  Surgeon: CWaynetta Sandy MD;  [place] [facility];  Service: Cardiovascular;  Laterality: Right;  right tp trunk    PERIPHERAL VASCULAR BALLOON ANGIOPLASTY Left 02/25/2019   Procedure: PERIPHERAL VASCULAR BALLOON ANGIOPLASTY;  Surgeon: FElam Dutch MD;  [place];  Service: Cardiovascular;  Laterality: Left;  tibial peroneal trunk and PT   PERIPHERAL VASCULAR INTERVENTION Right 07/21/2018   Procedure: PERIPHERAL VASCULAR INTERVENTION;  Surgeon: CMarty Heck MD;  [place];  Service: Cardiovascular;  Laterality: Right;  peroneal stents   REVISON OF ARTERIOVENOUS FISTULA Right 38/29/5621  Procedure: Plication OF Right Arm ARTERIOVENOUS FISTULA;  Surgeon: CElam Dutch MD;  [place]  Service: Vascular;  Laterality: Right;   REVISON OF ARTERIOVENOUS FISTULA Left 04/22/2018   Procedure: REVISION OF RADIOCEPHALIC ARTERIOVENOUS FISTULA;  Surgeon: CMarty Heck MD;  [place]  Service: Vascular;  Laterality: Left;   SHUNTOGRAM N/A 05/31/2013   Procedure: SEarney Mallet  Surgeon: VSerafina Mitchell MD;  [facility];  Service: Cardiovascular;  Laterality: N/A;   UPPER EXTREMITY VENOGRAPHY Right 07/23/2018   Procedure: UPPER EXTREMITY VENOGRAPHY;  Surgeon: DAngelia Mould MD;  [place];  Service: Cardiovascular;  Laterality: Right;   UPPER EXTREMITY VENOGRAPHY  07/15/2019   Procedure: UPPER EXTREMITY VENOGRAPHY;  Surgeon: FElam Dutch MD;  [place];  Service: Cardiovascular;;  rt arm    WOUND DEBRIDEMENT Right 07/17/2018   Procedure: Wound Debridement; Closure Filleted toe flap Right Foot;  Surgeon: [name], DPM;  Location: [place];  Service: Podiatry;  Laterality: Right;   WOUND DEBRIDEMENT Right 09/07/2018   Procedure: Debridement Right Foot Wound, application of wound vac;  Surgeon: [name], DPM;  Location: [place];  Service: Podiatry;  Laterality: Right;   WOUND DEBRIDEMENT Right 03/08/2019   Procedure: DEBRIDEMENT WOUND RIGHT FOOT;  Surgeon: [name], DPM;  Location: [place];  Service: Podiatry;  Laterality: Right;    There were no vitals filed for this visit.   Subjective Assessment - 02/07/21 1530     Subjective He denies pain or any issues with prosthesis today upon arrival.    Pertinent History PAD, DM2, arthritis, gout, CHF, implanted cardioverter-defib, HTN, CKD w/ ESRD    Patient Stated Goals to use prosthesis to walk in community,                 [facility] Adult PT Treatment/Exercise - 02/07/21 0001       Transfers   Transfers Sit to Stand;Stand to Sit    Sit to Stand 5: Supervision;With upper extremity assist;With armrests;From chair/3-in-1;Other (comment)    Sit to Stand Details Verbal cues for sequencing;Verbal cues for technique;Visual cues/gestures for sequencing    Stand to Sit 5: Supervision;With upper extremity assist;With armrests;To chair/3-in-1;Other (comment)      Ambulation/Gait   Ambulation/Gait Yes    Ambulation/Gait Assistance 5: Supervision    Ambulation Distance (Feet) 180 Feet   180' then 75   Assistive device Rolling walker;Prosthesis      High Level Balance   High Level Balance Activities Side stepping;Backward walking   and walking fwd with only one UE support   High Level Balance Comments 3 round trips in bars with bilt UE support except only one UE support for fwd walking      Knee/Hip Exercises: Machines for Strengthening   Cybex Leg Press BLEs 106# 3X15, with H.S stretching after each set      Knee/Hip Exercises: Standing   Forward Step Up Both;5 reps;Hand Hold: 2;Step Height: 6"    Forward Step Up Limitations verbal & tactile cues for technique    Step Down Both;1 set;5 reps;Hand Hold: 2;Step Height: 6"    Step Down Limitations verbal & tactile cues for technique                      PT Short Term Goals - 01/22/21 1513       PT SHORT TERM GOAL #1   Title Patient verbalizes understanding of signs of sweat & drying limb/liner    Baseline MEt 01/17/21    Time 4    Period Weeks    Status New    Target Date 02/14/21      PT SHORT TERM GOAL #2   Title Patient standing balance scans & reaches 5" without UE support with supervision.    Time 4    Period Weeks    Status Revised    Target Date 02/14/21      PT SHORT TERM GOAL #3   Title Patient sit to/from stand with chairs without armrests to RW with supervision.    Time 4    Period Weeks    Status New    Target Date  02/14/21      PT SHORT TERM GOAL #4   Title Patient ambulates  110' with RW & prosthesis with supervision.    Time 4    Period Weeks    Status Revised    Target Date 02/14/21      PT SHORT TERM GOAL #5   Title Patient negotiates ramps & curbs with RW & prostheis with supervision.    Time 4    Period Weeks    Status Revised    Target Date 02/14/21               PT Long Term Goals - 12/18/20 1658       PT LONG TERM GOAL #1   Title Patient & family verbalize & demonstrate understanding of proper prosthetic care to enable utilization of prosthesis.    Time 12    Period Weeks    Status On-going    Target Date 03/14/21      PT LONG TERM GOAL #2   Title Patient tolerates wear of prosthesis >90% of awake hours without skin or limb discomfort issues.    Time 12    Period Weeks    Status On-going    Target Date 03/14/21      PT LONG TERM GOAL #3   Title Tasks of Oceanographer with RW  >/= 45/56    Time 12    Period Weeks    Status Revised    Target Date 03/14/21      PT LONG TERM GOAL #4   Title Patient ambulates 250' with RW & prosthesis modified independent.    Time 12    Period Weeks    Status Revised    Target Date 03/14/21      PT LONG TERM GOAL #5   Title Patient negotiates ramps & curbs with RW & prosthesis with supervision    Time 12    Period Weeks    Status Revised    Target Date 03/14/21                   Plan - 02/07/21 1554     Clinical Impression Statement He continues to make good overall progress with PT. He does want to transition from RW to [facility] however primary PT does not recommend this due to decreased balance from cast shoe. I did have him walk today in parallel bars with just one UE support to simulate SPC vs RW and he did well with this.    Personal Factors and Comorbidities Comorbidity 3+;Fitness;Time since onset of injury/illness/exacerbation    Comorbidities PAD, DM2, arthritis, gout, CHF, implanted cardioverter-defib, HTN, CKD  w/ ESRD, blind left knee    Examination-Activity Limitations Locomotion Level;Stairs;Stand;Transfers    Examination-Participation Restrictions Community Activity    Stability/Clinical Decision Making Evolving/Moderate complexity    Rehab Potential Good    PT Frequency 2x / week    PT Duration 12 weeks    PT Treatment/Interventions ADLs/Self Care Home Management;DME Instruction;Gait training;Stair training;Functional mobility training;Therapeutic activities;Therapeutic exercise;Balance training;Neuromuscular re-education;Patient/family education;Prosthetic Training;Manual techniques;Vestibular    PT Next Visit Plan work towards updated STGs, progress as tolerated prosthetic gait with RW including ramps & curbs,  exercise for strength, balance, flexibility & endurance.    Consulted and Agree with Plan of Care Patient             Patient will benefit from skilled therapeutic intervention in order to improve the following deficits and impairments:  Abnormal gait, Cardiopulmonary status limiting activity, Decreased activity tolerance, Decreased balance, Decreased endurance, Decreased knowledge of use of DME, Decreased mobility, Decreased range  of motion, Decreased scar mobility, Decreased strength, Increased edema, Postural dysfunction, Prosthetic Dependency  Visit Diagnosis: Muscle weakness (generalized)  Stiffness of right knee, not elsewhere classified  Other abnormalities of gait and mobility  Unsteadiness on feet  Abnormal posture     Problem List Patient Active Problem List   Diagnosis Date Noted   Polycythemia, secondary 02/21/2020   Stable treated proliferative diabetic retinopathy of right eye determined by examination associated with type 2 diabetes mellitus (HCC) 11/29/2019   Stable treated proliferative diabetic retinopathy of left eye without macular edema determined by examination associated with type 2 diabetes mellitus (HCC) 11/29/2019   Advanced nonexudative  age-related macular degeneration of both eyes with subfoveal involvement 11/29/2019   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   Sepsis (HCC) 04/14/2019   Hypocalcemia 04/08/2019   Drug induced constipation    Anemia of chronic disease    ESRD on dialysis (HCC)    Postoperative pain    Phantom limb pain (HCC)    S/P BKA (below knee amputation), left (HCC) 03/18/2019   Unilateral complete BKA, left, initial encounter (HCC) 03/17/2019   Critical lower limb ischemia (HCC) 03/12/2019   Infection of amputation stump, right lower extremity (HCC) 03/10/2019   Hypotension 02/23/2019   Cellulitis of left foot 02/23/2019   Gangrene of toe of left foot (HCC) 02/22/2019   Complication of vascular dialysis catheter 11/24/2018   Chronic osteomyelitis involving right ankle and foot (HCC)    Ulcer of right foot with fat layer exposed (HCC) 09/02/2018   Encounter for planned post-operative wound closure    Acute osteomyelitis of metatarsal bone of right foot (HCC)    Diabetic ulcer of midfoot associated with diabetes mellitus due to underlying condition, with necrosis of bone (HCC)    Osteomyelitis of right foot (HCC)    Vascular calcification    Cellulitis and abscess of foot, except toes    Anemia associated with chronic renal failure 07/13/2018   Cardiomyopathy, dilated, nonischemic (HCC) 07/13/2018   Diabetic foot infection (HCC) 07/13/2018   PAD (peripheral artery disease) (HCC)    Macular degeneration    Ventricular tachycardia (HCC) 06/24/2017   Embolism due to vascular prosthetic devices, implants and grafts, initial encounter (HCC) 02/04/2016   Dependence on renal dialysis (HCC) 05/30/2015   Shortness of breath 02/05/2015   Chronic systolic heart failure (HCC) 08/22/2014   Diabetic infection of right foot (HCC) 07/13/2014   Type 2 diabetes mellitus with diabetic peripheral angiopathy without gangrene (HCC) 03/18/2014   Anxiety disorder, unspecified 02/23/2014   Fever,  unspecified 02/23/2014   Headache 02/23/2014   Pain, unspecified 02/23/2014   Pruritus, unspecified 02/23/2014   Mild protein-calorie malnutrition (HCC) 12/26/2013   ESRD (end stage renal disease) on dialysis (HCC) 11/11/2013   Snoring 11/11/2013   Unspecified sleep apnea 11/11/2013   Other pancytopenia (HCC) 09/22/2013   Hemoptysis 09/20/2013   Personal history of nicotine dependence 07/12/2013   Coagulation defect, unspecified (HCC) 05/16/2013   Iron deficiency anemia, unspecified 05/06/2013   Other disorders of plasma-protein metabolism, not elsewhere classified 04/15/2013   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 chronic kidney disease or end stage renal disease (HCC) 03/18/2013   Other cardiomyopathies (HCC) 03/18/2013   Secondary hyperparathyroidism of renal origin (HCC) 03/18/2013   Type 2 diabetes mellitus without complications (HCC) 03/18/2013   Pre-transplant evaluation for kidney transplant 01/11/2013   Automatic implantable cardioverter-defibrillator in situ 10/01/2010   HYPERCHOLESTEROLEMIA, MIXED 05/03/2010   Essential hypertension 05/03/2010   CHF 05/03/2010  Silvestre Mesi 02/07/2021, 3:55 PM  [facility] Physical Therapy [address] [place], [place], [address] Phone: [phone]   Fax:  [phone]  Name: Timothy Faulkner MRN: [id number] Date of Birth: [birth year]

## 2021-02-08 DIAGNOSIS — D689 Coagulation defect, unspecified: Secondary | ICD-10-CM | POA: Diagnosis not present

## 2021-02-08 DIAGNOSIS — N186 End stage renal disease: Secondary | ICD-10-CM | POA: Diagnosis not present

## 2021-02-08 DIAGNOSIS — Z992 Dependence on renal dialysis: Secondary | ICD-10-CM | POA: Diagnosis not present

## 2021-02-08 DIAGNOSIS — I12 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 chronic kidney disease or end stage renal disease: Secondary | ICD-10-CM | POA: Diagnosis not present

## 2021-02-08 DIAGNOSIS — R52 Pain, unspecified: Secondary | ICD-10-CM | POA: Diagnosis not present

## 2021-02-08 DIAGNOSIS — N2581 Secondary hyperparathyroidism of renal origin: Secondary | ICD-10-CM | POA: Diagnosis not present

## 2021-02-08 NOTE — Progress Notes (Signed)
  Subjective:  Patient ID: Timothy Faulkner, male    DOB: [birth year],  MRN: [id number]  Chief Complaint  Patient presents with   Wound Check    Denies any f/c/n/v. Reports new area showed up on last digit about 1 week ago. Denies any pain.    75 y.o. male presents for wound care. Hx confirmed with patient.   Objective:  Physical Exam: Wound Location: right 2nd toe Wound Measurement: epithelialized. Wound Base: epithelial Peri-wound: Normal Exudate: None: wound tissue dry wound without warmth, erythema, signs of acute infection  Wound Location: right 4th toe Wound Measurement: 0.2x02. Wound Base: granaular Peri-wound: Normal Exudate: None: wound tissue dry wound without warmth, erythema, signs of acute infection  Assessment:   1. Skin ulcer of fourth toe, right, limited to breakdown of skin [facility])    Plan:  Patient was evaluated and treated and all questions answered.  Ulcer right 2nd/4th toes -Previous ulcers appear healed. Small area on 4th toe without SOI. -No debridement today -Dressed with abx ointment and band-aid today. Continue daily. -Will need new appt for shoes to prevent further ulceration.   No follow-ups on file.

## 2021-02-11 DIAGNOSIS — N2581 Secondary hyperparathyroidism of renal origin: Secondary | ICD-10-CM | POA: Diagnosis not present

## 2021-02-11 DIAGNOSIS — Z992 Dependence on renal dialysis: Secondary | ICD-10-CM | POA: Diagnosis not present

## 2021-02-11 DIAGNOSIS — R52 Pain, unspecified: Secondary | ICD-10-CM | POA: Diagnosis not present

## 2021-02-11 DIAGNOSIS — D689 Coagulation defect, unspecified: Secondary | ICD-10-CM | POA: Diagnosis not present

## 2021-02-11 DIAGNOSIS — N186 End stage renal disease: Secondary | ICD-10-CM | POA: Diagnosis not present

## 2021-02-12 ENCOUNTER — Ambulatory Visit (INDEPENDENT_AMBULATORY_CARE_PROVIDER_SITE_OTHER): Payer: Medicare Other | Admitting: Physical Therapy

## 2021-02-12 ENCOUNTER — Encounter: Payer: Self-pay | Admitting: Physical Therapy

## 2021-02-12 ENCOUNTER — Other Ambulatory Visit: Payer: Self-pay

## 2021-02-12 DIAGNOSIS — R2689 Other abnormalities of gait and mobility: Secondary | ICD-10-CM | POA: Diagnosis not present

## 2021-02-12 DIAGNOSIS — M6281 Muscle weakness (generalized): Secondary | ICD-10-CM | POA: Diagnosis not present

## 2021-02-12 DIAGNOSIS — R2681 Unsteadiness on feet: Secondary | ICD-10-CM

## 2021-02-12 DIAGNOSIS — R293 Abnormal posture: Secondary | ICD-10-CM | POA: Diagnosis not present

## 2021-02-12 DIAGNOSIS — M25661 Stiffness of right knee, not elsewhere classified: Secondary | ICD-10-CM | POA: Diagnosis not present

## 2021-02-12 NOTE — Patient Instructions (Signed)
Sweating increases with an amputation. Your body is trying to regulate your temperature & without an extremity, you sweat more easily to cool off. Also prosthetic material like liners do not breath and add hot layers which causes even more sweating. With time your body typically will accommodate to prosthesis and your sweat level will come closer to level with amputation but not pre-amputation level.   You need to pat your limb & liner dry when you notice sweating. If you leave sweat trapped inside your liner, then it can result in a blister.   Signs of sweating in your liner: You are sweating elsewhere on your body or you notice sweat running / dripping.  Take note of how high your liner comes up on your limb when you first put your liner on your limb. If you notice that your liner has slipped down, then you probably have sweat inside your liner. A good time to check for liner slippage is when toileting.  You feel air bubbles inside your liner. When you liner slips, then air is allowed in bottom. As you put weight on prosthesis, the air is burp or pushed out. You feel something crawling or moving inside your liner. When sweat runs inside the closed system of liner, it often feels like a bug or something crawling inside your liner.  If any of above symptoms are noted, you need to remove your prosthesis & liner to pat your limb & liner dry. This is permanent need as leaving sweat or water trapped can result in a blister or wound.

## 2021-02-12 NOTE — Therapy (Signed)
[facility] [address] [place], [place], [address] Phone: [phone]   Fax:  [phone]  Physical Therapy Treatment  Patient Details  Name: Timothy Faulkner MRN: [id number] Date of Birth: 1945/12/29 Referring Provider (PT): Monica Martinez, MD   Encounter Date: 02/12/2021   PT End of Session - 02/12/21 8768     Visit Number 27    Number of Visits 63    Date for PT Re-Evaluation 12/24/20    Authorization Type UHC Medicare    Progress Note Due on Visit 42    PT Start Time 1511    PT Stop Time 1600    PT Time Calculation (min) 49 min    Equipment Utilized During Treatment Gait belt    Activity Tolerance Patient tolerated treatment well    Behavior During Therapy WFL for tasks assessed/performed             Past Medical History:  Diagnosis Date   Anemia    Arthritis    Gout   Automatic implantable cardioverter-defibrillator in situ    [employer]   CHF    ESRD on dialysis [facility])    [name] 03/11/2013 (03/11/2013) dialysis M/W/F   Gangrene ([place])    left foot   GERD (gastroesophageal reflux disease)    Gout    "once a year"   Heart murmur    HYPERCHOLESTEROLEMIA, MIXED    HYPERTENSION    Macular degeneration    Osteomyelitis ([place])    right foot   Other primary cardiomyopathies 07/16/2011   Pacemaker    Peripheral arterial disease (HCC)    left fifth toe ulcer, healing   Pneumonia    Shortness of breath    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 ([place])    left fifth toe   Wears dentures    Wears glasses     Past Surgical History:  Procedure Laterality Date   A/V FISTULAGRAM Left 07/20/2018   Procedure: A/V FISTULAGRAM;  Surgeon: [name], MD;  Location: [place] [facility];  Service: Cardiovascular;  Laterality: Left;   A/V FISTULAGRAM N/A 07/15/2019   Procedure: A/V FISTULAGRAM - Left Arm;  Surgeon: Elam Dutch, MD;  Location: [place] [facility];  Service: Cardiovascular;  Laterality:  N/A;   ABDOMINAL AORTOGRAM N/A 02/25/2019   Procedure: ABDOMINAL AORTOGRAM;  Surgeon: Elam Dutch, MD;  Location: [place] [facility];  Service: Cardiovascular;  Laterality: N/A;   ABDOMINAL AORTOGRAM W/LOWER EXTREMITY Bilateral 07/20/2018   Procedure: ABDOMINAL AORTOGRAM W/LOWER EXTREMITY;  Surgeon: [name], MD;  Location: [place] [facility];  Service: Cardiovascular;  Laterality: Bilateral;   AMPUTATION Right 09/07/2018   Procedure: Right fifth metatarsectomy;  Surgeon: [name], DPM;  Location: [place];  Service: Podiatry;  Laterality: Right;   AMPUTATION Left 03/08/2019   Procedure: AMPUTATION  3RD AND 4TH TOES LEFT FOOT;  Surgeon: [name], DPM;  Location: [place];  Service: Podiatry;  Laterality: Left;   AMPUTATION Left 03/15/2019   Procedure: AMPUTATION BELOW KNEE;  Surgeon: Elam Dutch, MD;  Location: [facility] OR;  Service: Vascular;  Laterality: Left;   AV FISTULA PLACEMENT Right 12/13/2012   Procedure: ARTERIOVENOUS (AV) FISTULA CREATION;  Surgeon: [name], MD;  Location: [place];  Service: Vascular;  Laterality: Right;  Ultrasound guided   AV FISTULA PLACEMENT Left 05/07/2016   Procedure: LEFT RADIOCEPHALIC ARTERIOVENOUS (AV) FISTULA CREATION;  Surgeon: [name], MD;  Location: [place];  Service:  Vascular;  Laterality: Left;   AV FISTULA PLACEMENT Right 08/09/2019   Procedure: INSERTION OF RIGHT ARTERIOVENOUS (AV) 4-56m GORE-TEX STRETCH GRAFT RIGHT  ARM;  Surgeon: FElam Dutch MD;  [place]  Service: Vascular;  Laterality: Right;   [place] 03/26/2016   Procedure: RIGHT BASILIC VEIN TRANSPOSITION;  Surgeon: TRosetta Posner MD;  [place]  Service: Vascular;  Laterality: Right;   CARDIAC CATHETERIZATION     CARDIAC DEFIBRILLATOR PLACEMENT     [employer]   CATARACT EXTRACTION W/PHACO Bilateral    EYE SURGERY Bilateral    Cataract   FISTULOGRAM Left 04/22/2018   Procedure: FISTULOGRAM UPPER EXTREMITY;  Surgeon:  CMarty Heck MD;  [place]  Service: Vascular;  Laterality: Left;   I & D EXTREMITY Right 07/15/2018   Procedure: IRRIGATION AND DEBRIDEMENT RIGHT FOOT;  Surgeon: PEvelina Bucy DPM;  [place]  Service: Podiatry;  Laterality: Right;   INCISION AND DRAINAGE ABSCESS / HEMATOMA OF BURSA / KNEE / THIGH Left 2012   "knee" (03/11/2013)   INSERT / REPLACE / REMOVE PACEMAKER     INSERTION OF DIALYSIS CATHETER Left 04/22/2018   Procedure: INSERTION OF DIALYSIS CATHETER;  Surgeon: CMarty Heck MD;  [facility]  Service: Vascular;  Laterality: Left;   IR FLUORO GUIDE CV LINE LEFT  07/15/2018   IR PTA VENOUS EXCEPT DIALYSIS CIRCUIT  07/15/2018   LOWER EXTREMITY ANGIOGRAPHY Right 07/21/2018   Procedure: LOWER EXTREMITY ANGIOGRAPHY;  Surgeon: CMarty Heck MD;  [place];  Service: Cardiovascular;  Laterality: Right;   LOWER EXTREMITY ANGIOGRAPHY Bilateral 02/25/2019   Procedure: Lower Extremity Angiography;  Surgeon: FElam Dutch MD;  [place];  Service: Cardiovascular;  Laterality: Bilateral;   METATARSAL HEAD EXCISION Right 07/15/2018   Procedure: METATARSAL RESECTION;  Surgeon: PEvelina Bucy DPM;  [place]  Service: Podiatry;  Laterality: Right;   MULTIPLE TOOTH EXTRACTIONS     PERIPHERAL VASCULAR ATHERECTOMY Right 02/28/2019   Procedure: PERIPHERAL VASCULAR ATHERECTOMY;  Surgeon: CWaynetta Sandy MD;  [place] [facility];  Service: Cardiovascular;  Laterality: Right;  right tp trunk    PERIPHERAL VASCULAR BALLOON ANGIOPLASTY Left 02/25/2019   Procedure: PERIPHERAL VASCULAR BALLOON ANGIOPLASTY;  Surgeon: FElam Dutch MD;  [place];  Service: Cardiovascular;  Laterality: Left;  tibial peroneal trunk and PT   PERIPHERAL VASCULAR INTERVENTION Right 07/21/2018   Procedure: PERIPHERAL VASCULAR INTERVENTION;  Surgeon: CMarty Heck MD;  [place];  Service: Cardiovascular;  Laterality: Right;  peroneal stents   REVISON OF ARTERIOVENOUS FISTULA Right 38/29/5621  Procedure: Plication OF Right Arm ARTERIOVENOUS FISTULA;  Surgeon: CElam Dutch MD;  [place]  Service: Vascular;  Laterality: Right;   REVISON OF ARTERIOVENOUS FISTULA Left 04/22/2018   Procedure: REVISION OF RADIOCEPHALIC ARTERIOVENOUS FISTULA;  Surgeon: CMarty Heck MD;  [place]  Service: Vascular;  Laterality: Left;   SHUNTOGRAM N/A 05/31/2013   Procedure: SEarney Mallet  Surgeon: VSerafina Mitchell MD;  [facility];  Service: Cardiovascular;  Laterality: N/A;   UPPER EXTREMITY VENOGRAPHY Right 07/23/2018   Procedure: UPPER EXTREMITY VENOGRAPHY;  Surgeon: DAngelia Mould MD;  [place];  Service: Cardiovascular;  Laterality: Right;   UPPER EXTREMITY VENOGRAPHY  07/15/2019   Procedure: UPPER EXTREMITY VENOGRAPHY;  Surgeon: FElam Dutch MD;  [place] [facility];  Service: Cardiovascular;;  rt arm    WOUND DEBRIDEMENT Right 07/17/2018   Procedure: Wound Debridement; Closure Filleted toe flap Right Foot;  Surgeon: [name], DPM;  Location: [place];  Service: Podiatry;  Laterality: Right;   WOUND DEBRIDEMENT Right 09/07/2018   Procedure: Debridement Right Foot Wound, application of wound vac;  Surgeon: [name], DPM;  Location: [place];  Service: Podiatry;  Laterality: Right;   WOUND DEBRIDEMENT Right 03/08/2019   Procedure: DEBRIDEMENT WOUND RIGHT FOOT;  Surgeon: [name], DPM;  Location: [place];  Service: Podiatry;  Laterality: Right;    There were no vitals filed for this visit.   Subjective Assessment - 02/12/21 1511     Subjective He continues to wear prosthesis all awake hours without issues.    Pertinent History PAD, DM2, arthritis, gout, CHF, implanted cardioverter-defib, HTN, CKD w/ ESRD    Patient Stated Goals to use prosthesis to walk in community,    Currently in Pain?  No/denies                               [facility] Adult PT Treatment/Exercise - 02/12/21 1512       Transfers   Transfers Sit to Stand;Stand to Sit    Sit to Stand 5: Supervision;With upper extremity assist;From chair/3-in-1;Other (comment)   goal to not touch //bars   Sit to Stand Details Verbal cues for sequencing;Verbal cues for technique;Visual cues/gestures for sequencing    Sit to Stand Details (indicate cue type and reason) 5 times with need to touch bars 3 of them    Stand to Sit 5: Supervision;With upper extremity assist;To chair/3-in-1;Other (comment)   goal to not need //bars to stabilize   Stand to Sit Details (indicate cue type and reason) Visual cues/gestures for precautions/safety;Verbal cues for technique      Ambulation/Gait   Ambulation/Gait Yes    Ambulation/Gait Assistance 4: Min assist;3: Mod assist   minA cane inside //bars. cane & HHA outside //bars   Ambulation/Gait Assistance Details demo, verbal & manual/tactile cues on cane use - sequence, cane placement length & width & balance reactions.    Ambulation Distance (Feet) 50 Feet   cane inside //bars 50' total, outside //bars with cane & HHA 40'   Assistive device Rolling walker;Prosthesis;Straight cane;1 person hand held assist   cane stand alone tip   Ambulation Surface Level;Indoor      High Level Balance   High Level Balance Activities Side stepping;Backward walking   and walking fwd with only one UE support   High Level Balance Comments 3 round trips in bars with bilt UE support except only one UE support for fwd walking      Knee/Hip Exercises: Stretches   Active Hamstring Stretch Both;2 reps;30 seconds   AAROM   Active Hamstring Stretch Limitations supine SLR with strap bw sets on leg press      Knee/Hip Exercises: Machines for Strengthening   Cybex Leg Press 112# 1 set 15 reps w/seat 45* and 100# 1 set 15 reps w/ seat flat.  both alternating lifting single leg off plate while maintaining  knee extension of stance limb.      Knee/Hip Exercises: Standing   Forward Step Up Both;5 reps;Hand Hold: 2;Step Height: 6"    Forward Step Up Limitations verbal & tactile cues for technique    Step Down Both;1 set;5 reps;Hand Hold: 2;Step Height: 6"    Step Down Limitations  verbal & tactile cues for technique      Prosthetics   Prosthetic Care Comments  reviewed including handout large bold print on signs of sweating & need to dry limb/liner    Current prosthetic wear tolerance (days/week)  daily including dialysis    Current prosthetic wear tolerance (#hours/day)  ~90% of awake hours                      PT Short Term Goals - 02/12/21 1615       PT SHORT TERM GOAL #1   Title Patient verbalizes understanding of signs of sweat & drying limb/liner    Time 4    Period Weeks    Status Achieved    Target Date 02/14/21      PT SHORT TERM GOAL #2   Title Patient standing balance scans & reaches 5" without UE support with supervision.    Time 4    Period Weeks    Status Achieved    Target Date 02/14/21      PT SHORT TERM GOAL #3   Title Patient sit to/from stand with chairs without armrests to RW with supervision.    Time 4    Period Weeks    Status Achieved    Target Date 02/14/21      PT SHORT TERM GOAL #4   Title Patient ambulates 25' with RW & prosthesis with supervision.    Time 4    Period Weeks    Status On-going    Target Date 02/14/21      PT SHORT TERM GOAL #5   Title Patient negotiates ramps & curbs with RW & prostheis with supervision.    Time 4    Period Weeks    Status On-going    Target Date 02/14/21               PT Long Term Goals - 12/18/20 1658       PT LONG TERM GOAL #1   Title Patient & family verbalize & demonstrate understanding of proper prosthetic care to enable utilization of prosthesis.    Time 12    Period Weeks    Status On-going    Target Date 03/14/21      PT LONG TERM GOAL #2   Title Patient tolerates wear of  prosthesis >90% of awake hours without skin or limb discomfort issues.    Time 12    Period Weeks    Status On-going    Target Date 03/14/21      PT LONG TERM GOAL #3   Title Tasks of Oceanographer with RW  >/= 45/56    Time 12    Period Weeks    Status Revised    Target Date 03/14/21      PT LONG TERM GOAL #4   Title Patient ambulates 250' with RW & prosthesis modified independent.    Time 12    Period Weeks    Status Revised    Target Date 03/14/21      PT LONG TERM GOAL #5   Title Patient negotiates ramps & curbs with RW & prosthesis with supervision    Time 12    Period Weeks    Status Revised    Target Date 03/14/21                   Plan - 02/12/21 1511     Clinical Impression Statement Patient met first 3 STGs for this  30-day period and PT anticipates meeting remaining 2 STGs next visit.  PT progressed to cane gait initially inside //bars then to outside //bars with HHA.  He will need more training to progress to safely using at household level.  He sees wound MD in 2 weeks and hopefully will be able to wear regular shoe which will help.    Personal Factors and Comorbidities Comorbidity 3+;Fitness;Time since onset of injury/illness/exacerbation    Comorbidities PAD, DM2, arthritis, gout, CHF, implanted cardioverter-defib, HTN, CKD w/ ESRD, blind left knee    Examination-Activity Limitations Locomotion Level;Stairs;Stand;Transfers    Examination-Participation Restrictions Community Activity    Stability/Clinical Decision Making Evolving/Moderate complexity    Rehab Potential Good    PT Frequency 2x / week    PT Duration 12 weeks    PT Treatment/Interventions ADLs/Self Care Home Management;DME Instruction;Gait training;Stair training;Functional mobility training;Therapeutic activities;Therapeutic exercise;Balance training;Neuromuscular re-education;Patient/family education;Prosthetic Training;Manual techniques;Vestibular    PT Next Visit Plan check remaining  STGs, progress as tolerated prosthetic gait with RW including ramps & curbs,  work on prosthetic gait with cane, exercise for strength, balance, flexibility & endurance.    Consulted and Agree with Plan of Care Patient             Patient will benefit from skilled therapeutic intervention in order to improve the following deficits and impairments:  Abnormal gait, Cardiopulmonary status limiting activity, Decreased activity tolerance, Decreased balance, Decreased endurance, Decreased knowledge of use of DME, Decreased mobility, Decreased range of motion, Decreased scar mobility, Decreased strength, Increased edema, Postural dysfunction, Prosthetic Dependency  Visit Diagnosis: Muscle weakness (generalized)  Stiffness of right knee, not elsewhere classified  Other abnormalities of gait and mobility  Unsteadiness on feet  Abnormal posture     Problem List Patient Active Problem List   Diagnosis Date Noted   Polycythemia, secondary 02/21/2020   Stable treated proliferative diabetic retinopathy of right eye determined by examination associated with type 2 diabetes mellitus ([place]) 11/29/2019   Stable treated proliferative diabetic retinopathy of left eye without macular edema determined by examination associated with type 2 diabetes mellitus ([place]) 11/29/2019   Advanced nonexudative age-related macular degeneration of both eyes with subfoveal involvement 11/29/2019   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   Sepsis ([place]) 04/14/2019   Hypocalcemia 04/08/2019   Drug induced constipation    Anemia of chronic disease    ESRD on dialysis ([place])    Postoperative pain    Phantom limb pain (HCC)    S/P BKA (below knee amputation), left ([place]) 03/18/2019   Unilateral complete BKA, left, initial encounter ([place]) 03/17/2019   Critical lower limb ischemia ([place]) 03/12/2019   Infection of amputation stump, right lower extremity ([place]) 03/10/2019   Hypotension 02/23/2019   Cellulitis of  left foot 02/23/2019   Gangrene of toe of left foot ([place]) 13/24/4010   Complication of vascular dialysis catheter 11/24/2018   Chronic osteomyelitis involving right ankle and foot ([place])    Ulcer of right foot with fat layer exposed ([place]) 09/02/2018   Encounter for planned post-operative wound closure    Acute osteomyelitis of metatarsal bone of right foot ([place])    Diabetic ulcer of midfoot associated with diabetes mellitus due to underlying condition, with necrosis of bone ([place])    Osteomyelitis of right foot ([place])    Vascular calcification    Cellulitis and abscess of foot, except toes    Anemia associated with chronic renal failure 07/13/2018   Cardiomyopathy, dilated, nonischemic ([place]) 07/13/2018   Diabetic foot infection (  [place]) 07/13/2018   PAD (peripheral artery disease) ([place])    Macular degeneration    Ventricular tachycardia ([place]) 06/24/2017   Embolism due to vascular prosthetic devices, implants and grafts, initial encounter ([place]) 02/04/2016   Dependence on renal dialysis ([place]) 05/30/2015   Shortness of breath 93/79/0240   Chronic systolic heart failure ([place]) 08/22/2014   Diabetic infection of right foot ([place]) 07/13/2014   Type 2 diabetes mellitus with diabetic peripheral angiopathy without gangrene ([place]) 03/18/2014   Anxiety disorder, unspecified 02/23/2014   Fever, unspecified 02/23/2014   Headache 02/23/2014   Pain, unspecified 02/23/2014   Pruritus, unspecified 02/23/2014   Mild protein-calorie malnutrition ([place]) 12/26/2013   ESRD (end stage renal disease) on dialysis ([place]) 11/11/2013   Snoring 11/11/2013   Unspecified sleep apnea 11/11/2013   Other pancytopenia ([place]) 09/22/2013   Hemoptysis 09/20/2013   Personal history of nicotine dependence 07/12/2013   Coagulation defect, unspecified ([place]) 05/16/2013   Iron deficiency anemia, unspecified 05/06/2013   Other disorders of plasma-protein metabolism, not elsewhere classified 04/15/2013   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 [place], [place], [address] Phone: [phone]   Fax:  [phone]  Name: Timothy Faulkner MRN: [id number] Date of Birth: 09/29/1945

## 2021-02-12 NOTE — Progress Notes (Signed)
Remote ICD transmission.   

## 2021-02-13 DIAGNOSIS — N2581 Secondary hyperparathyroidism of renal origin: Secondary | ICD-10-CM | POA: Diagnosis not present

## 2021-02-13 DIAGNOSIS — Z992 Dependence on renal dialysis: Secondary | ICD-10-CM | POA: Diagnosis not present

## 2021-02-13 DIAGNOSIS — N186 End stage renal disease: Secondary | ICD-10-CM | POA: Diagnosis not present

## 2021-02-13 DIAGNOSIS — D689 Coagulation defect, unspecified: Secondary | ICD-10-CM | POA: Diagnosis not present

## 2021-02-13 DIAGNOSIS — R52 Pain, unspecified: Secondary | ICD-10-CM | POA: Diagnosis not present

## 2021-02-15 DIAGNOSIS — N2581 Secondary hyperparathyroidism of renal origin: Secondary | ICD-10-CM | POA: Diagnosis not present

## 2021-02-15 DIAGNOSIS — D689 Coagulation defect, unspecified: Secondary | ICD-10-CM | POA: Diagnosis not present

## 2021-02-15 DIAGNOSIS — N186 End stage renal disease: Secondary | ICD-10-CM | POA: Diagnosis not present

## 2021-02-15 DIAGNOSIS — Z992 Dependence on renal dialysis: Secondary | ICD-10-CM | POA: Diagnosis not present

## 2021-02-15 DIAGNOSIS — R52 Pain, unspecified: Secondary | ICD-10-CM | POA: Diagnosis not present

## 2021-02-17 ENCOUNTER — Inpatient Hospital Stay (HOSPITAL_COMMUNITY)
Admission: EM | Admit: 2021-02-17 | Discharge: 2021-03-11 | DRG: 871 | Disposition: E | Payer: Medicare Other | Attending: Internal Medicine | Admitting: Internal Medicine

## 2021-02-17 ENCOUNTER — Encounter (HOSPITAL_COMMUNITY): Payer: Self-pay

## 2021-02-17 ENCOUNTER — Inpatient Hospital Stay (HOSPITAL_COMMUNITY): Payer: Medicare Other

## 2021-02-17 ENCOUNTER — Emergency Department (HOSPITAL_COMMUNITY): Payer: Medicare Other

## 2021-02-17 ENCOUNTER — Other Ambulatory Visit (HOSPITAL_COMMUNITY): Payer: Medicare Other

## 2021-02-17 DIAGNOSIS — A419 Sepsis, unspecified organism: Secondary | ICD-10-CM | POA: Diagnosis not present

## 2021-02-17 DIAGNOSIS — Z9289 Personal history of other medical treatment: Secondary | ICD-10-CM

## 2021-02-17 DIAGNOSIS — E1151 Type 2 diabetes mellitus with diabetic peripheral angiopathy without gangrene: Secondary | ICD-10-CM | POA: Diagnosis not present

## 2021-02-17 DIAGNOSIS — G9341 Metabolic encephalopathy: Secondary | ICD-10-CM | POA: Diagnosis not present

## 2021-02-17 DIAGNOSIS — J9600 Acute respiratory failure, unspecified whether with hypoxia or hypercapnia: Secondary | ICD-10-CM | POA: Diagnosis not present

## 2021-02-17 DIAGNOSIS — R0689 Other abnormalities of breathing: Secondary | ICD-10-CM | POA: Diagnosis not present

## 2021-02-17 DIAGNOSIS — Z66 Do not resuscitate: Secondary | ICD-10-CM | POA: Diagnosis not present

## 2021-02-17 DIAGNOSIS — D696 Thrombocytopenia, unspecified: Secondary | ICD-10-CM | POA: Diagnosis not present

## 2021-02-17 DIAGNOSIS — E1122 Type 2 diabetes mellitus with diabetic chronic kidney disease: Secondary | ICD-10-CM | POA: Diagnosis present

## 2021-02-17 DIAGNOSIS — Z87891 Personal history of nicotine dependence: Secondary | ICD-10-CM

## 2021-02-17 DIAGNOSIS — R778 Other specified abnormalities of plasma proteins: Secondary | ICD-10-CM

## 2021-02-17 DIAGNOSIS — R791 Abnormal coagulation profile: Secondary | ICD-10-CM | POA: Diagnosis present

## 2021-02-17 DIAGNOSIS — A4101 Sepsis due to Methicillin susceptible Staphylococcus aureus: Secondary | ICD-10-CM | POA: Diagnosis not present

## 2021-02-17 DIAGNOSIS — I428 Other cardiomyopathies: Secondary | ICD-10-CM | POA: Diagnosis not present

## 2021-02-17 DIAGNOSIS — N2581 Secondary hyperparathyroidism of renal origin: Secondary | ICD-10-CM | POA: Diagnosis present

## 2021-02-17 DIAGNOSIS — Z992 Dependence on renal dialysis: Secondary | ICD-10-CM

## 2021-02-17 DIAGNOSIS — I499 Cardiac arrhythmia, unspecified: Secondary | ICD-10-CM | POA: Diagnosis not present

## 2021-02-17 DIAGNOSIS — M109 Gout, unspecified: Secondary | ICD-10-CM | POA: Diagnosis present

## 2021-02-17 DIAGNOSIS — Z89512 Acquired absence of left leg below knee: Secondary | ICD-10-CM

## 2021-02-17 DIAGNOSIS — D631 Anemia in chronic kidney disease: Secondary | ICD-10-CM | POA: Diagnosis present

## 2021-02-17 DIAGNOSIS — R079 Chest pain, unspecified: Secondary | ICD-10-CM | POA: Diagnosis not present

## 2021-02-17 DIAGNOSIS — I5023 Acute on chronic systolic (congestive) heart failure: Secondary | ICD-10-CM | POA: Diagnosis present

## 2021-02-17 DIAGNOSIS — M1990 Unspecified osteoarthritis, unspecified site: Secondary | ICD-10-CM | POA: Diagnosis not present

## 2021-02-17 DIAGNOSIS — Z8249 Family history of ischemic heart disease and other diseases of the circulatory system: Secondary | ICD-10-CM

## 2021-02-17 DIAGNOSIS — E872 Acidosis, unspecified: Secondary | ICD-10-CM

## 2021-02-17 DIAGNOSIS — I462 Cardiac arrest due to underlying cardiac condition: Secondary | ICD-10-CM | POA: Diagnosis not present

## 2021-02-17 DIAGNOSIS — E873 Alkalosis: Secondary | ICD-10-CM | POA: Diagnosis not present

## 2021-02-17 DIAGNOSIS — Z743 Need for continuous supervision: Secondary | ICD-10-CM | POA: Diagnosis not present

## 2021-02-17 DIAGNOSIS — I132 Hypertensive heart and chronic kidney disease with heart failure and with stage 5 chronic kidney disease, or end stage renal disease: Secondary | ICD-10-CM | POA: Diagnosis not present

## 2021-02-17 DIAGNOSIS — R6520 Severe sepsis without septic shock: Secondary | ICD-10-CM

## 2021-02-17 DIAGNOSIS — R17 Unspecified jaundice: Secondary | ICD-10-CM | POA: Diagnosis present

## 2021-02-17 DIAGNOSIS — E874 Mixed disorder of acid-base balance: Secondary | ICD-10-CM | POA: Diagnosis not present

## 2021-02-17 DIAGNOSIS — R6521 Severe sepsis with septic shock: Secondary | ICD-10-CM | POA: Diagnosis not present

## 2021-02-17 DIAGNOSIS — Z20822 Contact with and (suspected) exposure to covid-19: Secondary | ICD-10-CM | POA: Diagnosis present

## 2021-02-17 DIAGNOSIS — Z515 Encounter for palliative care: Secondary | ICD-10-CM

## 2021-02-17 DIAGNOSIS — I10 Essential (primary) hypertension: Secondary | ICD-10-CM | POA: Diagnosis not present

## 2021-02-17 DIAGNOSIS — R52 Pain, unspecified: Secondary | ICD-10-CM

## 2021-02-17 DIAGNOSIS — G9340 Encephalopathy, unspecified: Secondary | ICD-10-CM

## 2021-02-17 DIAGNOSIS — R7881 Bacteremia: Secondary | ICD-10-CM

## 2021-02-17 DIAGNOSIS — E113593 Type 2 diabetes mellitus with proliferative diabetic retinopathy without macular edema, bilateral: Secondary | ICD-10-CM | POA: Diagnosis not present

## 2021-02-17 DIAGNOSIS — N186 End stage renal disease: Secondary | ICD-10-CM | POA: Diagnosis present

## 2021-02-17 DIAGNOSIS — E11649 Type 2 diabetes mellitus with hypoglycemia without coma: Secondary | ICD-10-CM | POA: Diagnosis not present

## 2021-02-17 DIAGNOSIS — E1129 Type 2 diabetes mellitus with other diabetic kidney complication: Secondary | ICD-10-CM | POA: Diagnosis not present

## 2021-02-17 DIAGNOSIS — Z79899 Other long term (current) drug therapy: Secondary | ICD-10-CM

## 2021-02-17 DIAGNOSIS — Z7902 Long term (current) use of antithrombotics/antiplatelets: Secondary | ICD-10-CM

## 2021-02-17 DIAGNOSIS — I509 Heart failure, unspecified: Secondary | ICD-10-CM | POA: Diagnosis not present

## 2021-02-17 DIAGNOSIS — R918 Other nonspecific abnormal finding of lung field: Secondary | ICD-10-CM | POA: Diagnosis not present

## 2021-02-17 DIAGNOSIS — D751 Secondary polycythemia: Secondary | ICD-10-CM | POA: Diagnosis present

## 2021-02-17 DIAGNOSIS — Z4659 Encounter for fitting and adjustment of other gastrointestinal appliance and device: Secondary | ICD-10-CM

## 2021-02-17 DIAGNOSIS — Z4682 Encounter for fitting and adjustment of non-vascular catheter: Secondary | ICD-10-CM | POA: Diagnosis not present

## 2021-02-17 DIAGNOSIS — H353133 Nonexudative age-related macular degeneration, bilateral, advanced atrophic without subfoveal involvement: Secondary | ICD-10-CM | POA: Diagnosis present

## 2021-02-17 DIAGNOSIS — Z95810 Presence of automatic (implantable) cardiac defibrillator: Secondary | ICD-10-CM

## 2021-02-17 DIAGNOSIS — R404 Transient alteration of awareness: Secondary | ICD-10-CM | POA: Diagnosis not present

## 2021-02-17 DIAGNOSIS — R001 Bradycardia, unspecified: Secondary | ICD-10-CM | POA: Diagnosis present

## 2021-02-17 DIAGNOSIS — K219 Gastro-esophageal reflux disease without esophagitis: Secondary | ICD-10-CM | POA: Diagnosis present

## 2021-02-17 DIAGNOSIS — Z79891 Long term (current) use of opiate analgesic: Secondary | ICD-10-CM

## 2021-02-17 DIAGNOSIS — Z7982 Long term (current) use of aspirin: Secondary | ICD-10-CM

## 2021-02-17 DIAGNOSIS — B9561 Methicillin susceptible Staphylococcus aureus infection as the cause of diseases classified elsewhere: Secondary | ICD-10-CM

## 2021-02-17 LAB — BLOOD CULTURE ID PANEL (REFLEXED) - BCID2

## 2021-02-17 LAB — I-STAT ARTERIAL BLOOD GAS, ED
Acid-Base Excess: 4 mmol/L — ABNORMAL HIGH (ref 0.0–2.0)
Bicarbonate: 21.9 mmol/L (ref 20.0–28.0)
Calcium, Ion: 1.09 mmol/L — ABNORMAL LOW (ref 1.15–1.40)
HCT: 41 % (ref 39.0–52.0)
Hemoglobin: 13.9 g/dL (ref 13.0–17.0)
O2 Saturation: 98 %
Patient temperature: 103
Potassium: 4.6 mmol/L (ref 3.5–5.1)
Sodium: 135 mmol/L (ref 135–145)
TCO2: 22 mmol/L (ref 22–32)
pCO2 arterial: 21.2 mmHg — ABNORMAL LOW (ref 32.0–48.0)
pH, Arterial: 7.627 (ref 7.350–7.450)
pO2, Arterial: 85 mmHg (ref 83.0–108.0)

## 2021-02-17 LAB — CBC WITH DIFFERENTIAL/PLATELET
Abs Immature Granulocytes: 0.11 10*3/uL — ABNORMAL HIGH (ref 0.00–0.07)
Basophils Absolute: 0 10*3/uL (ref 0.0–0.1)
Basophils Relative: 0 %
Eosinophils Absolute: 0 10*3/uL (ref 0.0–0.5)
Eosinophils Relative: 0 %
HCT: 43.2 % (ref 39.0–52.0)
Hemoglobin: 13.3 g/dL (ref 13.0–17.0)
Immature Granulocytes: 1 %
Lymphocytes Relative: 2 %
Lymphs Abs: 0.3 10*3/uL — ABNORMAL LOW (ref 0.7–4.0)
MCH: 32.9 pg (ref 26.0–34.0)
MCHC: 30.8 g/dL (ref 30.0–36.0)
MCV: 106.9 fL — ABNORMAL HIGH (ref 80.0–100.0)
Monocytes Absolute: 0.4 10*3/uL (ref 0.1–1.0)
Monocytes Relative: 3 %
Neutro Abs: 11.5 10*3/uL — ABNORMAL HIGH (ref 1.7–7.7)
Neutrophils Relative %: 94 %
Platelets: 104 10*3/uL — ABNORMAL LOW (ref 150–400)
RBC: 4.04 MIL/uL — ABNORMAL LOW (ref 4.22–5.81)
RDW: 15.7 % — ABNORMAL HIGH (ref 11.5–15.5)
WBC Morphology: INCREASED
WBC: 12.3 10*3/uL — ABNORMAL HIGH (ref 4.0–10.5)
nRBC: 0 % (ref 0.0–0.2)

## 2021-02-17 LAB — COMPREHENSIVE METABOLIC PANEL
ALT: 13 U/L (ref 0–44)
AST: 35 U/L (ref 15–41)
Albumin: 3.5 g/dL (ref 3.5–5.0)
Alkaline Phosphatase: 57 U/L (ref 38–126)
Anion gap: 21 — ABNORMAL HIGH (ref 5–15)
BUN: 40 mg/dL — ABNORMAL HIGH (ref 8–23)
CO2: 21 mmol/L — ABNORMAL LOW (ref 22–32)
Calcium: 10 mg/dL (ref 8.9–10.3)
Chloride: 94 mmol/L — ABNORMAL LOW (ref 98–111)
Creatinine, Ser: 8.85 mg/dL — ABNORMAL HIGH (ref 0.61–1.24)
GFR, Estimated: 6 mL/min — ABNORMAL LOW (ref >60)
Glucose, Bld: 96 mg/dL (ref 70–99)
Potassium: 4.5 mmol/L (ref 3.5–5.1)
Sodium: 136 mmol/L (ref 135–145)
Total Bilirubin: 1.6 mg/dL — ABNORMAL HIGH (ref 0.3–1.2)
Total Protein: 7.6 g/dL (ref 6.5–8.1)

## 2021-02-17 LAB — BASIC METABOLIC PANEL
Anion gap: 16 — ABNORMAL HIGH (ref 5–15)
Anion gap: 17 — ABNORMAL HIGH (ref 5–15)
BUN: 49 mg/dL — ABNORMAL HIGH (ref 8–23)
BUN: 55 mg/dL — ABNORMAL HIGH (ref 8–23)
CO2: 25 mmol/L (ref 22–32)
CO2: 20 mmol/L — ABNORMAL LOW (ref 22–32)
Calcium: 9.4 mg/dL (ref 8.9–10.3)
Calcium: 9.2 mg/dL (ref 8.9–10.3)
Chloride: 94 mmol/L — ABNORMAL LOW (ref 98–111)
Chloride: 97 mmol/L — ABNORMAL LOW (ref 98–111)
Creatinine, Ser: 8.99 mg/dL — ABNORMAL HIGH (ref 0.61–1.24)
Creatinine, Ser: 8.93 mg/dL — ABNORMAL HIGH (ref 0.61–1.24)
GFR, Estimated: 6 mL/min — ABNORMAL LOW (ref >60)
GFR, Estimated: 6 mL/min — ABNORMAL LOW (ref >60)
Glucose, Bld: 80 mg/dL (ref 70–99)
Glucose, Bld: 84 mg/dL (ref 70–99)
Potassium: 5.7 mmol/L — ABNORMAL HIGH (ref 3.5–5.1)
Potassium: 5.6 mmol/L — ABNORMAL HIGH (ref 3.5–5.1)
Sodium: 135 mmol/L (ref 135–145)
Sodium: 134 mmol/L — ABNORMAL LOW (ref 135–145)

## 2021-02-17 LAB — GLUCOSE, CAPILLARY
Glucose-Capillary: 61 mg/dL — ABNORMAL LOW (ref 70–99)
Glucose-Capillary: 97 mg/dL (ref 70–99)
Glucose-Capillary: 73 mg/dL (ref 70–99)
Glucose-Capillary: 84 mg/dL (ref 70–99)
Glucose-Capillary: 120 mg/dL — ABNORMAL HIGH (ref 70–99)

## 2021-02-17 LAB — CBC
HCT: 39.1 % (ref 39.0–52.0)
Hemoglobin: 12.2 g/dL — ABNORMAL LOW (ref 13.0–17.0)
MCH: 33.5 pg (ref 26.0–34.0)
MCHC: 31.2 g/dL (ref 30.0–36.0)
MCV: 107.4 fL — ABNORMAL HIGH (ref 80.0–100.0)
Platelets: 83 10*3/uL — ABNORMAL LOW (ref 150–400)
RBC: 3.64 MIL/uL — ABNORMAL LOW (ref 4.22–5.81)
RDW: 15.9 % — ABNORMAL HIGH (ref 11.5–15.5)
WBC: 12.4 10*3/uL — ABNORMAL HIGH (ref 4.0–10.5)
nRBC: 0 % (ref 0.0–0.2)

## 2021-02-17 LAB — CREATININE, SERUM
Creatinine, Ser: 8.86 mg/dL — ABNORMAL HIGH (ref 0.61–1.24)
GFR, Estimated: 6 mL/min — ABNORMAL LOW (ref >60)

## 2021-02-17 LAB — CBG MONITORING, ED: Glucose-Capillary: 93 mg/dL (ref 70–99)

## 2021-02-17 LAB — RESP PANEL BY RT-PCR (FLU A&B, COVID) ARPGX2
Influenza A by PCR: NEGATIVE
Influenza B by PCR: NEGATIVE
SARS Coronavirus 2 by RT PCR: NEGATIVE

## 2021-02-17 LAB — ECHOCARDIOGRAM COMPLETE
AR max vel: 1.36 cm2
AV Area VTI: 1.11 cm2
AV Area mean vel: 1.14 cm2
AV Mean grad: 5 mmHg
AV Peak grad: 9.6 mmHg
Ao pk vel: 1.55 m/s
Area-P 1/2: 2.95 cm2
Height: 68 in
MV VTI: 0.89 cm2
S' Lateral: 3.9 cm
Weight: 2528 oz

## 2021-02-17 LAB — PROTIME-INR
INR: 1.4 — ABNORMAL HIGH (ref 0.8–1.2)
Prothrombin Time: 16.8 seconds — ABNORMAL HIGH (ref 11.4–15.2)

## 2021-02-17 LAB — LACTIC ACID, PLASMA
Lactic Acid, Venous: 8.6 mmol/L (ref 0.5–1.9)
Lactic Acid, Venous: 6.6 mmol/L (ref 0.5–1.9)
Lactic Acid, Venous: 6 mmol/L (ref 0.5–1.9)
Lactic Acid, Venous: 4.3 mmol/L (ref 0.5–1.9)

## 2021-02-17 LAB — HEMOGLOBIN A1C
Hgb A1c MFr Bld: 4.5 % — ABNORMAL LOW (ref 4.8–5.6)
Mean Plasma Glucose: 82.45 mg/dL

## 2021-02-17 LAB — AMMONIA: Ammonia: 20 umol/L (ref 9–35)

## 2021-02-17 MED ORDER — SODIUM CHLORIDE 0.9 % IV BOLUS
30.0000 mL/kg | Freq: Once | INTRAVENOUS | Status: AC
  Administered 2021-02-17: 1000 mL via INTRAVENOUS

## 2021-02-17 MED ORDER — DOCUSATE SODIUM 100 MG PO CAPS: 100.0000 mg | ORAL_CAPSULE | Freq: Two times a day (BID) | ORAL | Status: DC | PRN

## 2021-02-17 MED ORDER — PIPERACILLIN-TAZOBACTAM 3.375 G IVPB 30 MIN
3.3750 g | Freq: Once | INTRAVENOUS | Status: AC
  Administered 2021-02-17: 3.375 g via INTRAVENOUS
  Filled 2021-02-17: qty 50

## 2021-02-17 MED ORDER — CHLORHEXIDINE GLUCONATE CLOTH 2 % EX PADS
6.0000 | MEDICATED_PAD | Freq: Every day | CUTANEOUS | Status: DC
  Administered 2021-02-17 – 2021-02-18 (×2): 6 via TOPICAL

## 2021-02-17 MED ORDER — DEXTROSE 50 % IV SOLN: 25.0000 mL | Freq: Once | INTRAVENOUS | Status: AC

## 2021-02-17 MED ORDER — DEXTROSE 50 % IV SOLN
INTRAVENOUS | Status: AC
  Filled 2021-02-17: qty 50

## 2021-02-17 MED ORDER — PERFLUTREN LIPID MICROSPHERE
1.0000 mL | INTRAVENOUS | Status: AC | PRN
  Administered 2021-02-17: 3 mL via INTRAVENOUS
  Filled 2021-02-17: qty 10

## 2021-02-17 MED ORDER — CLOPIDOGREL BISULFATE 75 MG PO TABS
75.0000 mg | ORAL_TABLET | Freq: Every day | ORAL | Status: DC
  Administered 2021-02-17: 75 mg
  Filled 2021-02-17 (×2): qty 1

## 2021-02-17 MED ORDER — DEXTROSE 50 % IV SOLN
25.0000 mL | Freq: Once | INTRAVENOUS | Status: AC
  Administered 2021-02-17: 25 mL via INTRAVENOUS

## 2021-02-17 MED ORDER — PANTOPRAZOLE SODIUM 40 MG IV SOLR
40.0000 mg | Freq: Every day | INTRAVENOUS | Status: DC
  Administered 2021-02-17: 40 mg via INTRAVENOUS
  Filled 2021-02-17: qty 40

## 2021-02-17 MED ORDER — DEXTROSE 50 % IV SOLN
INTRAVENOUS | Status: AC
  Administered 2021-02-17: 25 mL via INTRAVENOUS
  Filled 2021-02-17: qty 50

## 2021-02-17 MED ORDER — ASPIRIN 81 MG PO CHEW
81.0000 mg | CHEWABLE_TABLET | Freq: Every day | ORAL | Status: DC
  Administered 2021-02-17: 81 mg
  Filled 2021-02-17 (×2): qty 1

## 2021-02-17 MED ORDER — ACETAMINOPHEN 650 MG RE SUPP
650.0000 mg | Freq: Once | RECTAL | Status: AC
  Administered 2021-02-17: 650 mg via RECTAL
  Filled 2021-02-17: qty 1

## 2021-02-17 MED ORDER — ACETAMINOPHEN 650 MG RE SUPP: 650.0000 mg | Freq: Four times a day (QID) | RECTAL | Status: DC | PRN

## 2021-02-17 MED ORDER — INSULIN ASPART 100 UNIT/ML IJ SOLN
0.0000 [IU] | INTRAMUSCULAR | Status: DC
  Administered 2021-02-18: 2 [IU] via SUBCUTANEOUS

## 2021-02-17 MED ORDER — VANCOMYCIN HCL 1500 MG/300ML IV SOLN
1500.0000 mg | Freq: Once | INTRAVENOUS | Status: AC
  Administered 2021-02-17: 1500 mg via INTRAVENOUS
  Filled 2021-02-17: qty 300

## 2021-02-17 MED ORDER — SODIUM CHLORIDE 0.9 % IV SOLN
2.0000 g | INTRAVENOUS | Status: DC
  Administered 2021-02-17: 2 g via INTRAVENOUS
  Filled 2021-02-17 (×3): qty 20

## 2021-02-17 MED ORDER — SODIUM ZIRCONIUM CYCLOSILICATE 10 G PO PACK
10.0000 g | PACK | Freq: Once | ORAL | Status: AC
  Administered 2021-02-17: 10 g via ORAL
  Filled 2021-02-17: qty 1

## 2021-02-17 MED ORDER — CLOPIDOGREL BISULFATE 75 MG PO TABS: 75.0000 mg | ORAL_TABLET | Freq: Every day | ORAL | Status: DC

## 2021-02-17 MED ORDER — ASPIRIN 81 MG PO CHEW: 81.0000 mg | CHEWABLE_TABLET | Freq: Every day | ORAL | Status: DC

## 2021-02-17 MED ORDER — SODIUM CHLORIDE 0.9 % IV BOLUS: 1000.0000 mL | Freq: Once | INTRAVENOUS | Status: DC

## 2021-02-17 MED ORDER — LACTATED RINGERS IV BOLUS: 250.0000 mL | Freq: Once | INTRAVENOUS | Status: DC

## 2021-02-17 MED ORDER — POLYETHYLENE GLYCOL 3350 17 G PO PACK: 17.0000 g | PACK | Freq: Every day | ORAL | Status: DC | PRN

## 2021-02-17 MED ORDER — SODIUM CHLORIDE 0.9 % IV SOLN
2.0000 g | Freq: Once | INTRAVENOUS | Status: AC
  Administered 2021-02-17: 2 g via INTRAVENOUS
  Filled 2021-02-17: qty 2

## 2021-02-17 MED ORDER — LACTATED RINGERS IV BOLUS: 1000.0000 mL | Freq: Once | INTRAVENOUS | Status: DC

## 2021-02-17 MED ORDER — HEPARIN SODIUM (PORCINE) 5000 UNIT/ML IJ SOLN
5000.0000 [IU] | Freq: Three times a day (TID) | INTRAMUSCULAR | Status: DC
  Administered 2021-02-17 – 2021-02-18 (×5): 5000 [IU] via SUBCUTANEOUS
  Filled 2021-02-17 (×5): qty 1

## 2021-02-17 NOTE — ED Notes (Signed)
Rapid respirations he denies pain 02 sats difficult to find 3 different sites  02 sat 92

## 2021-02-17 NOTE — Progress Notes (Signed)
Pharmacy Antibiotic Note  Timothy Faulkner is a 75 y.o. male admitted on 02/26/2021 with sepsis.  Pharmacy has been consulted for vancomycin and cefepime dosing.  Plan: Cefepime 2gm IV x 1 Vancomycin 1500 mg IV x 1 F/u HD plans for further doses  Height: 5\' 8"  (172.7 cm) Weight: 71.7 kg (158 lb) (from Mar 2022 records) IBW/kg (Calculated) : 68.4  Temp (24hrs), Avg:103 F (39.4 C), Min:103 F (39.4 C), Max:103 F (39.4 C)  Recent Labs  Lab 03/10/2021 0117 02/08/2021 0317  WBC 12.3*  --   CREATININE 8.85*  --   LATICACIDVEN 8.6* 6.6*    Estimated Creatinine Clearance: 7.1 mL/min (A) (by C-G formula based on SCr of 8.85 mg/dL (H)).    No Known Allergies  Thank you for allowing pharmacy to be a part of this patient's care.  Excell Seltzer Poteet 02/28/2021 6:12 AM

## 2021-02-17 NOTE — Plan of Care (Signed)

## 2021-02-17 NOTE — ED Notes (Signed)
Present iv not working iv team contacted

## 2021-02-17 NOTE — Progress Notes (Signed)
PHARMACY - PHYSICIAN COMMUNICATION CRITICAL VALUE ALERT - BLOOD CULTURE IDENTIFICATION (BCID)  Timothy Faulkner is an 75 y.o. male who presented to [facility] on 02/19/2021 with possible sepsis.     Assessment:  ESRD s/p 1 dose of vancomycin, cefepime and zosyn 7/10 BCID with staph aureus  - no resistance noted  Name of physician (or Provider) Contacted: Dr Carlis Abbott  Changes to prescribed antibiotics recommended:  Rocephin 2gm q24h   Results for orders placed or performed during the hospital encounter of 02/28/2021  Blood Culture ID Panel (Reflexed) (Collected: 03/06/2021  1:48 AM)  Result Value Ref Range   Enterococcus faecalis NOT DETECTED NOT DETECTED   Enterococcus Faecium NOT DETECTED NOT DETECTED   Listeria monocytogenes NOT DETECTED NOT DETECTED   Staphylococcus species DETECTED (A) NOT DETECTED   Staphylococcus aureus (BCID) DETECTED (A) NOT DETECTED   Staphylococcus epidermidis NOT DETECTED NOT DETECTED   Staphylococcus lugdunensis NOT DETECTED NOT DETECTED   Streptococcus species NOT DETECTED NOT DETECTED   Streptococcus agalactiae NOT DETECTED NOT DETECTED   Streptococcus pneumoniae NOT DETECTED NOT DETECTED   Streptococcus pyogenes NOT DETECTED NOT DETECTED   A.calcoaceticus-baumannii NOT DETECTED NOT DETECTED   Bacteroides fragilis NOT DETECTED NOT DETECTED   Enterobacterales NOT DETECTED NOT DETECTED   Enterobacter cloacae complex NOT DETECTED NOT DETECTED   Escherichia coli NOT DETECTED NOT DETECTED   Klebsiella aerogenes NOT DETECTED NOT DETECTED   Klebsiella oxytoca NOT DETECTED NOT DETECTED   Klebsiella pneumoniae NOT DETECTED NOT DETECTED   Proteus species NOT DETECTED NOT DETECTED   Salmonella species NOT DETECTED NOT DETECTED   Serratia marcescens NOT DETECTED NOT DETECTED   Haemophilus influenzae NOT DETECTED NOT DETECTED   Neisseria meningitidis NOT DETECTED NOT DETECTED   Pseudomonas aeruginosa NOT DETECTED NOT DETECTED   Stenotrophomonas maltophilia NOT  DETECTED NOT DETECTED   Candida albicans NOT DETECTED NOT DETECTED   Candida auris NOT DETECTED NOT DETECTED   Candida glabrata NOT DETECTED NOT DETECTED   Candida krusei NOT DETECTED NOT DETECTED   Candida parapsilosis NOT DETECTED NOT DETECTED   Candida tropicalis NOT DETECTED NOT DETECTED   Cryptococcus neoformans/gattii NOT DETECTED NOT DETECTED   Meth resistant mecA/C and MREJ NOT DETECTED NOT DETECTED    Bonnita Nasuti Pharm.D. CPP, BCPS Clinical Pharmacist [phone] 03/09/2021 3:03 PM

## 2021-02-17 NOTE — ED Triage Notes (Signed)
Pt comes from home via [facility], has been altered for thee past 2 days, responds to verbal and painful stimuli, pt is a dialysis pt, MWF, complaint with treatment, febrile with EMS.

## 2021-02-17 NOTE — Progress Notes (Signed)
RUQ [place] with mildly dilated CBD, no mention of stones or wall thickening. He denies abdominal pain. Wants to try eating and drinking.  BP (!) 120/57   Pulse (!) 47   Temp 99.1 F (37.3 C) (Oral)   Resp 11   Ht 5\' 8"  (1.727 m)   Wt 71.7 kg Comment: from Mar 2022 records  SpO2 94%   BMI 24.02 kg/m  Bradycardic Awake, answering questions appropriately, oriented to place. Profoundly improved mental status since this morning.    Ok to do nursing bedside swallow and try diet.  Most recent BMP with K+ 5.7, but bicarb better and no good explanation for rapid jump in K+. Will recheck this afternoon. If he passes swallow, will give 1 dose lokelma.  Daughter updated at bedside.   Julian Hy, DO 03/08/2021 2:04 PM [facility]

## 2021-02-17 NOTE — ED Notes (Signed)
Critical care at  The bedside nasal 02 was suggested  02 placed on his nose  10 minutes later the pt had pulled it off.

## 2021-02-17 NOTE — ED Notes (Signed)
No working iv access

## 2021-02-17 NOTE — ED Notes (Signed)
Attempted report x1. 

## 2021-02-17 NOTE — ED Notes (Signed)
The pt is in constant motion he keeps pulling his ekg leads off  And his sat and bp only show up once in an hour

## 2021-02-17 NOTE — Progress Notes (Signed)
Patient seen and examined this morning in the ED. Remains confused, not on vasopressors.  BP 107/85 (BP Location: Left Arm)   Pulse (!) 126   Temp (!) 103 F (39.4 C) (Oral)   Resp (!) 23   Ht 5\' 8"  (1.727 m)   Wt 71.7 kg Comment: from Mar 2022 records  SpO2 (!) 62%   BMI 24.02 kg/m  Chronically ill appearing man lying in bed in NAD Confused, eyes open but not consistently tracking or attempting to communicate. Moving all extremities spontaneously but not following commands. [place]/AT, eyes anicteric S1S2, bradycardic Faint rhales on the right anteriorly, clear on the left. Breathing comfortably on RA, tachypnea. Skin warm, dry.  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 No cyanosis.  Labs reviewed. LA 8.6>6.6  K+ 4.5 Bicarb 21 BUN 40  T bili 1.6 with normal transaminase levels WBC 12.3, left shift INR 1.4  Assessment & plan: Acute metabolic encephalopathy due to sepsis- POA -delirium precautions -sepsis treatment -avoid sedating medications -agree with ICU monitoring   Sepsis; presumed due to bacteremia in ESRD patient. Biliary source also possible. No areas of cellulitis.  -follow blood cultures -con't broad empiric antibiotics -supportive care  ESRD, no urgent HD indications- POA -[facility] consult called in; tentatively HD tomorrow -renal diet when able to start eating; probably too confused currently -renally dose meds -strict I/Os -recheck BMP later this morning  Lactic acidosis due to sepsis; compensating with respiratory alkalosis-POA -recheck LA later this morning  Hyperbilirubinemia, likely 2/2 sepsis.  Had history of GB stones and sludge in 2020. Not clear that he followed up after as an outpatient. Still has his GB. -RUQ [place] -hold PTA statin for now  Elevated INR likely due to sepsis, possible due to liver pathology. Not on warfarin PTA. Present on admission. 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  -agree with holding  home regimen for now given possibility to develop hypotension  -Echo ordered overnight, pending  This patient is critically ill with multiple organ system failure which requires frequent high complexity decision making, assessment, support, evaluation, and titration of therapies. This was completed through the application of advanced monitoring technologies and extensive interpretation of multiple databases. During this encounter critical care time was devoted to patient care services described in this note for 33 minutes.  Julian Hy, DO 02/17/2021 7:51 AM [facility]

## 2021-02-17 NOTE — ED Notes (Signed)
Pt to ct 

## 2021-02-17 NOTE — Consult Note (Signed)
[facility]  Total days of antibiotics 1               Reason for Consult: sepsis due to MSSA bacteremia   Referring Physician: auto consult/gonzalez  Active Problems:   Sepsis ([place])    HPI: Timothy Faulkner is a 75 y.o. malehistory of HTN, DM, ESRD on HD, left BKA,  CHF, defibrillator in place, PAD admitted overnight with acute ams, EMS noted he was febrile to 103F, without hypotension but LA of 8.6, WBC of 12.3. started on broad spectrum abtx for sepsis. CXR mild congestion per my read. Infectious work up revealed blood cx + MSSA on BCID < 24hr of collection. Abtx narrowed to ceftriaxone. Patient steadily improving sensorium.lactic acidosis improving with fluid, not requiring pressors.  He is able to tell me where he is at, city, where he lives. 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   Pacemaker    Peripheral arterial disease (HCC)    left fifth toe ulcer, healing   Pneumonia    Shortness of breath    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)    left fifth toe   Wears dentures    Wears glasses     Allergies: No Known Allergies   MEDICATIONS:  aspirin  81 mg Per Tube Daily   Chlorhexidine Gluconate Cloth  6 each Topical Q0600   clopidogrel  75 mg Per Tube Daily   heparin  5,000 Units Subcutaneous Q8H   insulin aspart  0-6 Units Subcutaneous Q4H   pantoprazole (PROTONIX) IV  40 mg Intravenous QHS    Social History   Tobacco Use   Smoking  status: Former    Packs/day: 0.75    Years: 30.00    Pack years: 22.50    Types: Cigarettes    Quit date: 11/25/1992    Years since quitting: 28.2   Smokeless tobacco: Never  Vaping Use   Vaping Use: Never used  Substance Use Topics   Alcohol use: Not Currently   Drug use: Not Currently    Types: Marijuana    Comment: last use 10 years ago    Family History  Problem Relation Age of Onset   Heart disease Father    CAD Father      Review of Systems  Constitutional: positive for fever, chills, diaphoresis, activity change, appetite change, fatigue and unexpected weight change.  HENT: Negative for congestion, sore throat, rhinorrhea, sneezing, trouble swallowing and sinus pressure.  Eyes: Negative for photophobia and visual disturbance.  Respiratory: Negative for cough, chest tightness, shortness of breath, wheezing and stridor.  Cardiovascular: Negative for chest pain, palpitations and leg swelling.  Gastrointestinal: Negative for nausea, vomiting, abdominal pain, diarrhea, constipation, blood in stool, abdominal distention and anal bleeding.  Genitourinary: Negative for dysuria, hematuria, flank pain and difficulty urinating.  Musculoskeletal: Negative for myalgias, back  pain, joint swelling, arthralgias and gait problem.  Skin: Negative for color change, pallor, rash and wound.  Neurological: Negative for dizziness, tremors, weakness and light-headedness.  Hematological: Negative for adenopathy. Does not bruise/bleed easily.  Psychiatric/Behavioral: Negative for behavioral problems, confusion, sleep disturbance, dysphoric mood, decreased concentration and agitation.    OBJECTIVE: Temp:  [98 F (36.7 C)-103 F (39.4 C)] 98 F (36.7 C) (07/10 1610) Pulse Rate:  [37-126] 45 (07/10 1800) Resp:  [11-46] 13 (07/10 1800) BP: (91-164)/(53-151) 119/54 (07/10 1800) SpO2:  [62 %-100 %] 96 % (07/10 1800) Weight:  [71.7 kg] 71.7 kg (07/10 0331) Physical Exam  Constitutional: He is  oriented to person, place, and time. He appears well-developed and well-nourished. No distress.  HENT:  Mouth/Throat: Oropharynx is clear and moist. No oropharyngeal exudate. Missing a few teeth Cardiovascular: Normal rate, regular rhythm and normal heart sounds. Exam reveals no gallop and no friction rub.  No murmur heard.  Pulmonary/Chest: Effort normal and breath sounds normal. No respiratory distress. He has no wheezes.  Abdominal: Soft. Bowel sounds are normal. He exhibits no distension. There is no tenderness.  Lymphadenopathy:  He has no cervical adenopathy.  Ext: left BKA, hypopigmentation to left arm scarring. Neurological: He is alert and oriented to person, place, and time.  Skin: Skin is warm and dry. No rash noted. No erythema.  Psychiatric: He has a normal mood and affect. His behavior is normal.    LABS: Results for orders placed or performed during the hospital encounter of 02/08/2021 (from the past 48 hour(s))  Comprehensive metabolic panel     Status: Abnormal   Collection Time: 02/16/2021  1:17 AM  Result Value Ref Range   Sodium 136 135 - 145 mmol/L   Potassium 4.5 3.5 - 5.1 mmol/L   Chloride 94 (L) 98 - 111 mmol/L   CO2 21 (L) 22 - 32 mmol/L   Glucose, Bld 96 70 - 99 mg/dL    Comment: Glucose reference range applies only to samples taken after fasting for at least 8 hours.   BUN 40 (H) 8 - 23 mg/dL   Creatinine, Ser 8.85 (H) 0.61 - 1.24 mg/dL   Calcium 10.0 8.9 - 10.3 mg/dL   Total Protein 7.6 6.5 - 8.1 g/dL   Albumin 3.5 3.5 - 5.0 g/dL   AST 35 15 - 41 U/L   ALT 13 0 - 44 U/L    Comment: RESULTS CONFIRMED BY MANUAL DILUTION   Alkaline Phosphatase 57 38 - 126 U/L   Total Bilirubin 1.6 (H) 0.3 - 1.2 mg/dL   GFR, Estimated 6 (L) >60 mL/min    Comment: (NOTE) Calculated using the CKD-EPI Creatinine Equation (2021)    Anion gap 21 (H) 5 - 15    Comment: REPEATED TO VERIFY Performed at [facility] [address], [place], [place] [address]   Lactic  acid, plasma     Status: Abnormal   Collection Time: 02/15/2021  1:17 AM  Result Value Ref Range   Lactic Acid, Venous 8.6 (HH) 0.5 - 1.9 mmol/L    Comment: CRITICAL RESULT CALLED TO, READ BACK BY AND VERIFIED WITH:  C. CHRISCO RN @0333  03/03/2021 K. SANDERS Performed at [facility], [place] [address] [place], [place] [address]   CBC with Differential     Status: Abnormal   Collection Time: 02/27/2021  1:17 AM  Result Value Ref Range   WBC 12.3 (H) 4.0 - 10.5 K/uL   RBC 4.04 (L) 4.22 - 5.81  MIL/uL   Hemoglobin 13.3 13.0 - 17.0 g/dL    Comment: REPEATED TO VERIFY   HCT 43.2 39.0 - 52.0 %   MCV 106.9 (H) 80.0 - 100.0 fL   MCH 32.9 26.0 - 34.0 pg   MCHC 30.8 30.0 - 36.0 g/dL   RDW 15.7 (H) 11.5 - 15.5 %   Platelets 104 (L) 150 - 400 K/uL    Comment: Immature Platelet Fraction may be clinically indicated, consider ordering this additional test [id number] REPEATED TO VERIFY PLATELET COUNT CONFIRMED BY SMEAR    nRBC 0.0 0.0 - 0.2 %   Neutrophils Relative % 94 %   Neutro Abs 11.5 (H) 1.7 - 7.7 K/uL   Lymphocytes Relative 2 %   Lymphs Abs 0.3 (L) 0.7 - 4.0 K/uL   Monocytes Relative 3 %   Monocytes Absolute 0.4 0.1 - 1.0 K/uL   Eosinophils Relative 0 %   Eosinophils Absolute 0.0 0.0 - 0.5 K/uL   Basophils Relative 0 %   Basophils Absolute 0.0 0.0 - 0.1 K/uL   WBC Morphology INCREASED BANDS (>20% BANDS)     Comment: VACUOLATED NEUTROPHILS   Immature Granulocytes 1 %   Abs Immature Granulocytes 0.11 (H) 0.00 - 0.07 K/uL    Comment: Performed at [facility], [address] [place], [place] [address]  Protime-INR     Status: Abnormal   Collection Time: 03/02/2021  1:17 AM  Result Value Ref Range   Prothrombin Time 16.8 (H) 11.4 - 15.2 seconds   INR 1.4 (H) 0.8 - 1.2    Comment: (NOTE) INR goal varies based on device and disease states. Performed at [facility], [place] [address], [place], [place] [address]   Ammonia     Status: None   Collection Time: 03/03/2021  1:25 AM   Result Value Ref Range   Ammonia 20 9 - 35 umol/L    Comment: Performed at [facility], [place] [address] [place], [place] [address]  Culture, blood (Routine x 2)     Status: None (Preliminary result)   Collection Time: 02/11/2021  1:48 AM   Specimen: BLOOD LEFT FOREARM  Result Value Ref Range   Specimen Description BLOOD LEFT FOREARM    Special Requests      BOTTLES DRAWN AEROBIC AND ANAEROBIC Blood Culture results may not be optimal due to an inadequate volume of blood received in culture bottles   Culture  Setup Time      GRAM POSITIVE COCCI IN CLUSTERS IN BOTH AEROBIC AND ANAEROBIC BOTTLES CRITICAL RESULT CALLED TO, READ BACK BY AND VERIFIED WITH: PHARM D J.MILLEN ON [id number] AT 4403 BY E.PARRISH Performed at [facility], [place] [address] [place], [place] [address]    Culture GRAM POSITIVE COCCI    Report Status PENDING   Blood Culture ID Panel (Reflexed)     Status: Abnormal   Collection Time: 02/25/2021  1:48 AM  Result Value Ref Range   Enterococcus faecalis NOT DETECTED NOT DETECTED   Enterococcus Faecium NOT DETECTED NOT DETECTED   Listeria monocytogenes NOT DETECTED NOT DETECTED   Staphylococcus species DETECTED (A) NOT DETECTED    Comment: CRITICAL RESULT CALLED TO, READ BACK BY AND VERIFIED WITH: PHARM D J.MILLEN ON [id number] AT 1438 BY E.PARRISH    Staphylococcus aureus (BCID) DETECTED (A) NOT DETECTED    Comment: CRITICAL RESULT CALLED TO, READ BACK BY AND VERIFIED WITH: PHARM D J.MILLEN ON [id number] AT 1438 BY E.PARRISH    Staphylococcus  epidermidis NOT DETECTED NOT DETECTED   Staphylococcus lugdunensis NOT DETECTED NOT DETECTED   Streptococcus species NOT DETECTED NOT DETECTED   Streptococcus agalactiae NOT DETECTED NOT DETECTED   Streptococcus pneumoniae NOT DETECTED NOT DETECTED   Streptococcus pyogenes NOT DETECTED NOT DETECTED   A.calcoaceticus-baumannii NOT DETECTED NOT DETECTED   Bacteroides fragilis NOT DETECTED NOT DETECTED   Enterobacterales NOT  DETECTED NOT DETECTED   Enterobacter cloacae complex NOT DETECTED NOT DETECTED   Escherichia coli NOT DETECTED NOT DETECTED   Klebsiella aerogenes NOT DETECTED NOT DETECTED   Klebsiella oxytoca NOT DETECTED NOT DETECTED   Klebsiella pneumoniae NOT DETECTED NOT DETECTED   Proteus species NOT DETECTED NOT DETECTED   Salmonella species NOT DETECTED NOT DETECTED   Serratia marcescens NOT DETECTED NOT DETECTED   Haemophilus influenzae NOT DETECTED NOT DETECTED   Neisseria meningitidis NOT DETECTED NOT DETECTED   Pseudomonas aeruginosa NOT DETECTED NOT DETECTED   Stenotrophomonas maltophilia NOT DETECTED NOT DETECTED   Candida albicans NOT DETECTED NOT DETECTED   Candida auris NOT DETECTED NOT DETECTED   Candida glabrata NOT DETECTED NOT DETECTED   Candida krusei NOT DETECTED NOT DETECTED   Candida parapsilosis NOT DETECTED NOT DETECTED   Candida tropicalis NOT DETECTED NOT DETECTED   Cryptococcus neoformans/gattii NOT DETECTED NOT DETECTED   Meth resistant mecA/C and MREJ NOT DETECTED NOT DETECTED    Comment: Performed at [facility], [address] [place], [place] [address]  Culture, blood (Routine x 2)     Status: None (Preliminary result)   Collection Time: 02/09/2021  2:10 AM   Specimen: BLOOD LEFT HAND  Result Value Ref Range   Specimen Description BLOOD LEFT HAND    Special Requests      BOTTLES DRAWN AEROBIC AND ANAEROBIC Blood Culture results may not be optimal due to an inadequate volume of blood received in culture bottles   Culture  Setup Time      GRAM POSITIVE COCCI IN CLUSTERS IN BOTH AEROBIC AND ANAEROBIC BOTTLES CRITICAL VALUE NOTED.  VALUE IS CONSISTENT WITH PREVIOUSLY REPORTED AND CALLED VALUE. Performed at [facility], [place] [address] [place], [place] [address]    Culture GRAM POSITIVE COCCI    Report Status PENDING   I-Stat arterial blood gas, ED     Status: Abnormal   Collection Time: 02/27/2021  2:39 AM  Result Value Ref Range   pH, Arterial 7.627  (HH) 7.350 - 7.450   pCO2 arterial 21.2 (L) 32.0 - 48.0 mmHg   pO2, Arterial 85 83.0 - 108.0 mmHg   Bicarbonate 21.9 20.0 - 28.0 mmol/L   TCO2 22 22 - 32 mmol/L   O2 Saturation 98.0 %   Acid-Base Excess 4.0 (H) 0.0 - 2.0 mmol/L   Sodium 135 135 - 145 mmol/L   Potassium 4.6 3.5 - 5.1 mmol/L   Calcium, Ion 1.09 (L) 1.15 - 1.40 mmol/L   HCT 41.0 39.0 - 52.0 %   Hemoglobin 13.9 13.0 - 17.0 g/dL   Patient temperature 103.0 F    Collection site Radial    Drawn by RT    Sample type ARTERIAL    Comment NOTIFIED PHYSICIAN   Lactic acid, plasma     Status: Abnormal   Collection Time: 02/12/2021  3:17 AM  Result Value Ref Range   Lactic Acid, Venous 6.6 (HH) 0.5 - 1.9 mmol/L    Comment: CRITICAL VALUE NOTED.  VALUE IS CONSISTENT WITH PREVIOUSLY REPORTED AND CALLED VALUE. Performed at [facility],  [address], [place], [place] [address]   Resp Panel by RT-PCR (Flu A&B, Covid) Nasopharyngeal Swab     Status: None   Collection Time: 02/16/2021  3:25 AM   Specimen: Nasopharyngeal Swab; Nasopharyngeal(NP) swabs in vial transport medium  Result Value Ref Range   SARS Coronavirus 2 by RT PCR NEGATIVE NEGATIVE    Comment: (NOTE) SARS-CoV-2 target nucleic acids are NOT DETECTED.  The SARS-CoV-2 RNA is generally detectable in upper respiratory specimens during the acute phase of infection. The lowest concentration of SARS-CoV-2 viral copies this assay can detect is 138 copies/mL. A negative result does not preclude SARS-Cov-2 infection and should not be used as the sole basis for treatment or other patient management decisions. A negative result may occur with  improper specimen collection/handling, submission of specimen other than nasopharyngeal swab, presence of viral mutation(s) within the areas targeted by this assay, and inadequate number of viral copies(<138 copies/mL). A negative result must be combined with clinical observations, patient history, and epidemiological information.  The expected result is Negative.  Fact Sheet for Patients:  EntrepreneurPulse.com.au  Fact Sheet for Healthcare Providers:  IncredibleEmployment.be  This test is no t yet approved or cleared by the [place] FDA and  has been authorized for detection and/or diagnosis of SARS-CoV-2 by FDA under an Emergency Use Authorization (EUA). This EUA will remain  in effect (meaning this test can be used) for the duration of the COVID-19 declaration under Section 564(b)(1) of the Act, 21 U.S.C.section 360bbb-3(b)(1), unless the authorization is terminated  or revoked sooner.       Influenza A by PCR NEGATIVE NEGATIVE   Influenza B by PCR NEGATIVE NEGATIVE    Comment: (NOTE) The Xpert Xpress SARS-CoV-2/FLU/RSV plus assay is intended as an aid in the diagnosis of influenza from Nasopharyngeal swab specimens and should not be used as a sole basis for treatment. Nasal washings and aspirates are unacceptable for Xpert Xpress SARS-CoV-2/FLU/RSV testing.  Fact Sheet for Patients: EntrepreneurPulse.com.au  Fact Sheet for Healthcare Providers: IncredibleEmployment.be  This test is not yet approved or cleared by the [place] FDA and has been authorized for detection and/or diagnosis of SARS-CoV-2 by FDA under an Emergency Use Authorization (EUA). This EUA will remain in effect (meaning this test can be used) for the duration of the COVID-19 declaration under Section 564(b)(1) of the Act, 21 U.S.C. section 360bbb-3(b)(1), unless the authorization is terminated or revoked.  Performed at [facility], [place] [address] [place], [place] [address]   CBG monitoring, ED     Status: None   Collection Time: 03/08/2021  7:53 AM  Result Value Ref Range   Glucose-Capillary 93 70 - 99 mg/dL    Comment: Glucose reference range applies only to samples taken after fasting for at least 8 hours.  CBC     Status: Abnormal    Collection Time: 03/02/2021  7:55 AM  Result Value Ref Range   WBC 12.4 (H) 4.0 - 10.5 K/uL   RBC 3.64 (L) 4.22 - 5.81 MIL/uL   Hemoglobin 12.2 (L) 13.0 - 17.0 g/dL   HCT 39.1 39.0 - 52.0 %   MCV 107.4 (H) 80.0 - 100.0 fL   MCH 33.5 26.0 - 34.0 pg   MCHC 31.2 30.0 - 36.0 g/dL   RDW 15.9 (H) 11.5 - 15.5 %   Platelets 83 (L) 150 - 400 K/uL    Comment: Immature Platelet Fraction may be clinically indicated, consider ordering this additional test [id number] CONSISTENT WITH PREVIOUS  RESULT REPEATED TO VERIFY    nRBC 0.0 0.0 - 0.2 %    Comment: Performed at [facility], [place] [address], [place], [place] [address]  Creatinine, serum     Status: Abnormal   Collection Time: 02/26/2021  7:55 AM  Result Value Ref Range   Creatinine, Ser 8.86 (H) 0.61 - 1.24 mg/dL   GFR, Estimated 6 (L) >60 mL/min    Comment: (NOTE) Calculated using the CKD-EPI Creatinine Equation (2021) Performed at [facility] [address] [place], [place] [address]   Hemoglobin A1c     Status: Abnormal   Collection Time: 02/11/2021  7:55 AM  Result Value Ref Range   Hgb A1c MFr Bld 4.5 (L) 4.8 - 5.6 %    Comment: (NOTE) Pre diabetes:          5.7%-6.4%  Diabetes:              >6.4%  Glycemic control for   <7.0% adults with diabetes    Mean Plasma Glucose 82.45 mg/dL    Comment: Performed at [facility] [address] [place], [place] [address]  Basic metabolic panel     Status: Abnormal   Collection Time: 02/13/2021 10:23 AM  Result Value Ref Range   Sodium 135 135 - 145 mmol/L   Potassium 5.7 (H) 3.5 - 5.1 mmol/L   Chloride 94 (L) 98 - 111 mmol/L   CO2 25 22 - 32 mmol/L   Glucose, Bld 80 70 - 99 mg/dL    Comment: Glucose reference range applies only to samples taken after fasting for at least 8 hours.   BUN 49 (H) 8 - 23 mg/dL   Creatinine, Ser 8.99 (H) 0.61 - 1.24 mg/dL   Calcium 9.4 8.9 - 10.3 mg/dL   GFR, Estimated 6 (L) >60 mL/min    Comment: (NOTE) Calculated using the CKD-EPI  Creatinine Equation (2021)    Anion gap 16 (H) 5 - 15    Comment: Performed at [facility] [address] [place], [place] [address]  Lactic acid, plasma     Status: Abnormal   Collection Time: 02/24/2021 10:23 AM  Result Value Ref Range   Lactic Acid, Venous 6.0 (HH) 0.5 - 1.9 mmol/L    Comment: CRITICAL VALUE NOTED.  VALUE IS CONSISTENT WITH PREVIOUSLY REPORTED AND CALLED VALUE. Performed at [facility], [place] [address], [place], [place] [address]   Glucose, capillary     Status: Abnormal   Collection Time: 02/24/2021 11:48 AM  Result Value Ref Range   Glucose-Capillary 61 (L) 70 - 99 mg/dL    Comment: Glucose reference range applies only to samples taken after fasting for at least 8 hours.  Glucose, capillary     Status: None   Collection Time: 03/07/2021 12:43 PM  Result Value Ref Range   Glucose-Capillary 97 70 - 99 mg/dL    Comment: Glucose reference range applies only to samples taken after fasting for at least 8 hours.  Basic metabolic panel     Status: Abnormal   Collection Time: 02/16/2021  2:52 PM  Result Value Ref Range   Sodium 134 (L) 135 - 145 mmol/L   Potassium 5.6 (H) 3.5 - 5.1 mmol/L   Chloride 97 (L) 98 - 111 mmol/L   CO2 20 (L) 22 - 32 mmol/L   Glucose, Bld 84 70 - 99 mg/dL    Comment: Glucose reference range applies only to samples taken after fasting  for at least 8 hours.   BUN 55 (H) 8 - 23 mg/dL   Creatinine, Ser 8.93 (H) 0.61 - 1.24 mg/dL   Calcium 9.2 8.9 - 10.3 mg/dL   GFR, Estimated 6 (L) >60 mL/min    Comment: (NOTE) Calculated using the CKD-EPI Creatinine Equation (2021)    Anion gap 17 (H) 5 - 15    Comment: Performed at [facility] [address], [place], [place] [address]  Lactic acid, plasma     Status: Abnormal   Collection Time: 02/19/2021  2:52 PM  Result Value Ref Range   Lactic Acid, Venous 4.3 (HH) 0.5 - 1.9 mmol/L    Comment: CRITICAL VALUE NOTED.  VALUE IS CONSISTENT WITH PREVIOUSLY REPORTED AND CALLED  VALUE. Performed at [facility], [place] [address] [place], [place] [address]   Glucose, capillary     Status: None   Collection Time: 03/07/2021  4:05 PM  Result Value Ref Range   Glucose-Capillary 73 70 - 99 mg/dL    Comment: Glucose reference range applies only to samples taken after fasting for at least 8 hours.  Glucose, capillary     Status: None   Collection Time: 03/03/2021  5:52 PM  Result Value Ref Range   Glucose-Capillary 84 70 - 99 mg/dL    Comment: Glucose reference range applies only to samples taken after fasting for at least 8 hours.    MICRO: 7/10 blood cx gPC= MSSA IMAGING: DG Chest 1 View  Result Date: 03/02/2021 CLINICAL DATA:  Chest pain EXAM: CHEST  1 VIEW COMPARISON:  04/14/2019 FINDINGS: Cardiac shadow is stable. Defibrillator is again noted and stable. Dialysis catheter has been removed in the interval. Lungs are well aerated bilaterally. Mild central vascular congestion is seen without edema. No focal infiltrate is seen. Stenting in the right are is again identified and stable. IMPRESSION: Mild vascular congestion without interstitial edema. No other focal abnormality is noted. Electronically Signed   By: Inez Catalina M.D.   On: 02/21/2021 01:58   CT Head Wo Contrast  Result Date: 03/08/2021 CLINICAL DATA:  Altered mental status EXAM: CT HEAD WITHOUT CONTRAST TECHNIQUE: Contiguous axial images were obtained from the base of the skull through the vertex without intravenous contrast. COMPARISON:  04/14/2019 FINDINGS: Brain: Somewhat limited due to patient motion artifact. Chronic atrophic and ischemic changes are noted. No findings to suggest acute hemorrhage, acute infarction or space-occupying mass lesion are noted. Vascular: No hyperdense vessel or unexpected calcification. Skull: Normal. Negative for fracture or focal lesion. Sinuses/Orbits: No acute finding. Other: None. IMPRESSION: Chronic atrophic and ischemic changes without acute abnormality. Electronically  Signed   By: Inez Catalina M.D.   On: 02/08/2021 03:22   ECHOCARDIOGRAM COMPLETE  Result Date: 02/25/2021    ECHOCARDIOGRAM REPORT   Patient Name:   Timothy Faulkner Date of Exam: 02/21/2021 Medical Rec #:  [id number]       Height:       68.0 in Accession #:    [id number]      Weight:       158.0 lb Date of Birth:  [birth year]       BSA:          1.849 m Patient Age:    75 years        BP:           91/77 mmHg Patient Gender: M               HR:  50 bpm. Exam Location:  Inpatient Procedure: 2D Echo, Cardiac Doppler, Color Doppler and Intracardiac            Opacification Agent Indications:     Fever  History:         Patient has prior history of Echocardiogram examinations, most                  recent 04/17/2019. CHF; Pacemaker. ESRD. GERD.  Sonographer:     Clayton Lefort RDCS (AE) Referring Phys:  [id number] Estill Cotta Diagnosing Phys: [name] MD IMPRESSIONS  1. Left ventricular ejection fraction, by estimation, is 30 to 35%. The left ventricle has severely decreased function. The left ventricle demonstrates global hypokinesis. Left ventricular diastolic parameters are indeterminate.  2. Right ventricular systolic function is normal. The right ventricular size is normal.  3. The mitral valve is normal in structure. Mild mitral valve regurgitation.  4. The aortic valve is calcified. Aortic valve regurgitation is not visualized. Mild aortic valve sclerosis is present, with no evidence of aortic valve stenosis.  5. The inferior vena cava is normal in size with <50% respiratory variability, suggesting right atrial pressure of 8 mmHg. FINDINGS  Left Ventricle: Left ventricular ejection fraction, by estimation, is 30 to 35%. The left ventricle has severely decreased function. The left ventricle demonstrates global hypokinesis. Definity contrast agent was given IV to delineate the left ventricular endocardial borders. The left ventricular internal cavity size was normal in size. There is no left ventricular  hypertrophy. Left ventricular diastolic parameters are indeterminate. Right Ventricle: The right ventricular size is normal. No increase in right ventricular wall thickness. Right ventricular systolic function is normal. Left Atrium: Left atrial size was normal in size. Right Atrium: Right atrial size was normal in size. Pericardium: There is no evidence of pericardial effusion. Mitral Valve: The mitral valve is normal in structure. Mild mitral valve regurgitation. MV peak gradient, 4.0 mmHg. The mean mitral valve gradient is 1.0 mmHg. Tricuspid Valve: The tricuspid valve is normal in structure. Tricuspid valve regurgitation is mild. Aortic Valve: The aortic valve is calcified. Aortic valve regurgitation is not visualized. Mild aortic valve sclerosis is present, with no evidence of aortic valve stenosis. Aortic valve mean gradient measures 5.0 mmHg. Aortic valve peak gradient measures 9.6 mmHg. Aortic valve area, by VTI measures 1.11 cm. Pulmonic Valve: The pulmonic valve was normal in structure. Pulmonic valve regurgitation is not visualized. Aorta: The aortic root is normal in size and structure. Venous: The inferior vena cava is normal in size with less than 50% respiratory variability, suggesting right atrial pressure of 8 mmHg. IAS/Shunts: No atrial level shunt detected by color flow Doppler. Additional Comments: A device lead is visualized.  LEFT VENTRICLE PLAX 2D LVIDd:         4.50 cm  Diastology LVIDs:         3.90 cm  LV e' medial:    5.06 cm/s LV PW:         1.40 cm  LV E/e' medial:  22.1 LV IVS:        1.10 cm  LV e' lateral:   8.93 cm/s LVOT diam:     1.80 cm  LV E/e' lateral: 12.5 LV SV:         40 LV SV Index:   22 LVOT Area:     2.54 cm  RIGHT VENTRICLE             IVC RV Basal diam:  2.50 cm  IVC diam: 1.70 cm RV S prime:     10.10 cm/s TAPSE (M-mode): 1.3 cm LEFT ATRIUM           Index       RIGHT ATRIUM           Index LA diam:      3.80 cm 2.06 cm/m  RA Area:     10.80 cm LA Vol (A2C):  37.6 ml 20.34 ml/m RA Volume:   22.70 ml  12.28 ml/m LA Vol (A4C): 70.5 ml 38.13 ml/m  AORTIC VALVE AV Area (Vmax):    1.36 cm AV Area (Vmean):   1.14 cm AV Area (VTI):     1.11 cm AV Vmax:           155.00 cm/s AV Vmean:          99.600 cm/s AV VTI:            0.363 m AV Peak Grad:      9.6 mmHg AV Mean Grad:      5.0 mmHg LVOT Vmax:         82.80 cm/s LVOT Vmean:        44.500 cm/s LVOT VTI:          0.159 m LVOT/AV VTI ratio: 0.44  AORTA Ao Root diam: 3.20 cm MITRAL VALVE                TRICUSPID VALVE MV Area (PHT): 2.95 cm     TR Peak grad:   22.7 mmHg MV Area VTI:   0.89 cm     TR Vmax:        238.00 cm/s MV Peak grad:  4.0 mmHg MV Mean grad:  1.0 mmHg     SHUNTS MV Vmax:       1.00 m/s     Systemic VTI:  0.16 m MV Vmean:      55.7 cm/s    Systemic Diam: 1.80 cm MV Decel Time: 257 msec MV E velocity: 112.00 cm/s MV A velocity: 108.00 cm/s MV E/A ratio:  1.04 [name] MD Electronically signed by [name] MD Signature Date/Time: 02/27/2021/12:57:42 PM    Final    US Abdomen Limited RUQ (LIVER/GB)  Result Date: 03/07/2021 CLINICAL DATA:  Sepsis, hyperbilirubinemia EXAM: ULTRASOUND ABDOMEN LIMITED RIGHT UPPER QUADRANT COMPARISON:  None. FINDINGS: Gallbladder: No gallstones or wall thickening visualized. No sonographic Murphy sign noted by sonographer. Common bile duct: Diameter: 8 mm Liver: Limited visualization. No focal lesion identified. Within normal limits in parenchymal echogenicity. Portal vein is patent on color Doppler imaging with normal direction of blood flow towards the liver. Other: None. IMPRESSION: Mild common bile duct dilatation without calculi or other obstructing etiology identified. Consider MRCP or nuclear scintigraphic HIDA scan to further assess patency of the common bile duct if obstruction is suspected. Electronically Signed   By: Eddie Candle M.D.   On: 03/07/2021 10:18    Assessment/Plan:  75yo M who presented with sepsis, lactic acidosis found to have MSSA  bacteremia  - recommend to narrow further to cefazolin to dose after HD - will need TEE to evaluate in cardiac device/leads are involved - repeat blood cx after HD tomorrow  Will need to consider also looking for other nidus of infection  Lactic acidemia = improving with treatment of sepsis  Hyperbilirubinemia = mildly elevated. U/S showing mild CBD dilatation. Suspect slight elevation related to sepsis.continue to follow hepatic panel.

## 2021-02-17 NOTE — ED Notes (Signed)
Unable to obtain accurate O2 reading.

## 2021-02-17 NOTE — Progress Notes (Signed)
  Echocardiogram 2D Echocardiogram has been performed.  Timothy Faulkner 02/19/2021, 11:55 AM

## 2021-02-17 NOTE — H&P (Signed)
NAME:  Timothy Faulkner, MRN:  [id number], DOB:  03-01-1946, LOS: 0 ADMISSION DATE:  02/12/2021, CONSULTATION DATE:  03/09/2021 REFERRING MD:  Almyra Free MD, CHIEF COMPLAINT: AMS    History of Present Illness:  Patient is encephalopathic. Therefore history has been obtained from chart review. No family present at bedside.    Timothy Faulkner is 75 y.o. M who presented to the [facility] ED on 7/10 with AMS  He has a pertient past medical history of ESRD (MWF dialysis), CHF, Defibrillator in place, DM2, HTN, HLD, PAD, GERD, L BKA.  Per chart review, Timothy Faulkner lives at home with his daughter and he is able to hold normal conversations at baseline. His last dialysis was on Friday 7/8. He reportedly does not make urine. On 7/8-7/9 he became increasingly altered and he remained in bed all day on 7/9. Family expressed that he occasionally becomes confused once a month. Family denies that the patient had any fever, cough, vomiting, or diarrhea. EMS was called on 7/10 and he was brought to the ED.  ED workup was notable for a fever of 103F, WBC 12.3, initial lactate of 8.6, which decreased to 6.6 after 500cc of IVF. Head CT was negative for acute process. CXR did not demonstrate any clear infiltrate or effusion. No ST changes seen on 12 lead. Ammonia 20. LFT WNL. Creatinine 8.85 (baseline 4-5). He was given a dose of Vancomycin and Zosyn.   PCCM was consulted for admission to the ICU.  Pertinent  Medical History  ESRD (MWF dialysis), CHF, Defibrillator in place, DM2, HTN, HLD, PAD, GERD, L BKA,   Significant Hospital Events: Including procedures, antibiotic start and stop dates in addition to other pertinent events   7/10 Admit. 1 dose zosyn. Vanc> Cefepime> BC> UC>  Interim History / Subjective:  See above  Unable to obtain subjective evaluation due to patient status  Objective   Blood pressure (!) 144/78, pulse (!) 42, temperature (!) 103 F (39.4 C), temperature source Oral, resp. rate (!) 22, height 5'  8" (1.727 m), weight 71.7 kg, SpO2 (!) 82 %.       No intake or output data in the 24 hours ending 03/06/2021 0546 Filed Weights   02/25/2021 0331  Weight: 71.7 kg    Examination: General:  ill appearing, in bed, frail HEENT: MM pink/moist, anicteric, atraumatic Neuro: GCS 13 Confused, localizes, eyes open spontaneously, RASS +1, PERRL 2 CV: S1S2, SR on monitor, no m/r/g appreciated PULM:  Air movement in the upper lobes and in the lower lobes, Trachea midline, chest expansion symmetric GI: soft, bsx4 active, non distended Extremities: warm/dry, no pretibil edema, capillary refill less than 3 seconds  Skin: RUE fistula + thrill and bruit, no rashes or lesions   Resolved Hospital Problem list     Assessment & Plan:  Sepsis- ?source unclear. Dialysis MWF. Implanted defibrillator. Fever, Lactic 6.6.  Acute Metabolic Encephalopathy- suspect secondary to sepsis. Head CT negative for acute process, Ammonia 20, BG 96 Respiratory Alkalosis- suspect secondary to tachypnea from sepsis AGMA- AG 20, suspect secondary to lactic acidosis Lactic Acidosis- secondary to sepsis 8.6>6.6 -Admit to ICU -Trend lactate -Follow up [facility] and UC -Start Vanc and Cefepime. Narrow as cultures result -Obtain ECHO -Goal MAP 65 or greater -S/P 551ml IVF. Holding on further IVF in setting of ESRD -Continue to monitor neurological exam -Follow respiratory status closely  ESRD- MWF dialysis.  -Will need AM nephrology consult -HD stable at this time  Hx CHF, with Automatic implantable  cardioverter-defibrillator HX VF- Per cards notes with appropriate ICD shock. Hx HTN HX HLD HX PVD Seen by Dr. Lovena Le [facility] -Follow up ECHO -Holding on home antihypertensives at this time -Establish enteral access to facilitate ASA and plavix administration.  -Follow up if patient is still taking clonidine   DM2 -Blood Glucose goal 140-180. -Start SSI sensitive   Best Practice (right click and "Reselect  all SmartList Selections" daily)   Diet/type: NPO and NPO w/ meds via tube DVT prophylaxis: prophylactic heparin  GI prophylaxis: PPI Lines: N/A Foley:  N/A Code Status:  full code Last date of multidisciplinary goals of care discussion [Pending]  Labs   CBC: Recent Labs  Lab 02/08/2021 0117 03/08/2021 0239  WBC 12.3*  --   NEUTROABS 11.5*  --   HGB 13.3 13.9  HCT 43.2 41.0  MCV 106.9*  --   PLT 104*  --     Basic Metabolic Panel: Recent Labs  Lab 02/11/2021 0117 02/08/2021 0239  NA 136 135  K 4.5 4.6  CL 94*  --   CO2 21*  --   GLUCOSE 96  --   BUN 40*  --   CREATININE 8.85*  --   CALCIUM 10.0  --    GFR: Estimated Creatinine Clearance: 7.1 mL/min (A) (by C-G formula based on SCr of 8.85 mg/dL (H)). Recent Labs  Lab 02/16/2021 0117 02/19/2021 0317  WBC 12.3*  --   LATICACIDVEN 8.6* 6.6*    Liver Function Tests: Recent Labs  Lab 02/24/2021 0117  AST 35  ALT 13  ALKPHOS 57  BILITOT 1.6*  PROT 7.6  ALBUMIN 3.5   No results for input(s): LIPASE, AMYLASE in the last 168 hours. Recent Labs  Lab 02/28/2021 0125  AMMONIA 20    ABG    Component Value Date/Time   PHART 7.627 (HH) 02/26/2021 0239   PCO2ART 21.2 (L) 02/21/2021 0239   PO2ART 85 02/11/2021 0239   HCO3 21.9 02/27/2021 0239   TCO2 22 03/07/2021 0239   O2SAT 98.0 02/10/2021 0239     Coagulation Profile: Recent Labs  Lab 03/07/2021 0117  INR 1.4*    Cardiac Enzymes: No results for input(s): CKTOTAL, CKMB, CKMBINDEX, TROPONINI in the last 168 hours.  HbA1C: Hgb A1c MFr Bld  Date/Time Value Ref Range Status  03/12/2019 05:45 PM 5.2 4.8 - 5.6 % Final    Comment:    (NOTE) Pre diabetes:          5.7%-6.4% Diabetes:              >6.4% Glycemic control for   <7.0% adults with diabetes   07/13/2018 12:52 PM 4.7 (L) 4.8 - 5.6 % Final    Comment:    (NOTE) Pre diabetes:          5.7%-6.4% Diabetes:              >6.4% Glycemic control for   <7.0% adults with diabetes     CBG: No results  for input(s): GLUCAP in the last 168 hours.  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   Surgical History:   Past Surgical History:  Procedure Laterality Date   A/V FISTULAGRAM Left 07/20/2018   Procedure: A/V FISTULAGRAM;  Surgeon: [name], MD;  Location: [place] [facility];  Service: Cardiovascular;  Laterality: Left;   A/V FISTULAGRAM N/A 07/15/2019   Procedure: A/V FISTULAGRAM - Left Arm;  Surgeon: Elam Dutch, MD;  Location: [place] [facility];  Service: Cardiovascular;  Laterality: N/A;   ABDOMINAL AORTOGRAM N/A 02/25/2019   Procedure: ABDOMINAL AORTOGRAM;  Surgeon: Elam Dutch, MD;  Location: [place] [facility];  Service: Cardiovascular;  Laterality: N/A;   ABDOMINAL AORTOGRAM W/LOWER EXTREMITY Bilateral 07/20/2018   Procedure: ABDOMINAL AORTOGRAM W/LOWER EXTREMITY;  Surgeon: [name], MD;  Location: [place] [facility] LAB;  Service: Cardiovascular;  Laterality: Bilateral;   AMPUTATION Right 09/07/2018   Procedure: Right fifth metatarsectomy;  Surgeon: [name], DPM;  Location: [place];  Service: Podiatry;  Laterality: Right;   AMPUTATION Left 03/08/2019   Procedure: AMPUTATION  3RD AND 4TH TOES LEFT FOOT;  Surgeon: [name], DPM;  Location: [place];  Service: Podiatry;  Laterality: Left;   AMPUTATION Left 03/15/2019   Procedure: AMPUTATION BELOW KNEE;  Surgeon: Elam Dutch,  MD;  Location: [facility] OR;  Service: Vascular;  Laterality: Left;   AV FISTULA PLACEMENT Right 12/13/2012   Procedure: ARTERIOVENOUS (AV) FISTULA CREATION;  Surgeon: [name], MD;  Location: [place];  Service: Vascular;  Laterality: Right;  Ultrasound guided   AV FISTULA PLACEMENT Left 05/07/2016   Procedure: LEFT RADIOCEPHALIC ARTERIOVENOUS (AV) FISTULA CREATION;  Surgeon: [name], MD;  Location: [facility] OR;  Service: Vascular;  Laterality: Left;   AV FISTULA PLACEMENT Right 08/09/2019   Procedure: INSERTION OF RIGHT ARTERIOVENOUS (AV) 4-1mm GORE-TEX STRETCH GRAFT RIGHT  ARM;  Surgeon: Elam Dutch, MD;  Location: [place];  Service: Vascular;  Laterality: Right;   [place] Right 03/26/2016   Procedure: RIGHT BASILIC VEIN TRANSPOSITION;  Surgeon: [name], MD;  Location: [place];  Service: Vascular;  Laterality: Right;   CARDIAC CATHETERIZATION     CARDIAC DEFIBRILLATOR PLACEMENT     [employer]   CATARACT EXTRACTION W/PHACO Bilateral    EYE SURGERY Bilateral    Cataract   FISTULOGRAM Left 04/22/2018   Procedure: FISTULOGRAM UPPER EXTREMITY;  Surgeon: [name], MD;  Location: [place];  Service: Vascular;  Laterality: Left;   I & D EXTREMITY Right 07/15/2018   Procedure: IRRIGATION AND DEBRIDEMENT RIGHT FOOT;  Surgeon: [name], DPM;  Location: [place];  Service: Podiatry;  Laterality: Right;   INCISION AND DRAINAGE ABSCESS / HEMATOMA OF BURSA / KNEE / THIGH Left 2012   "knee" (03/11/2013)   INSERT / REPLACE / REMOVE PACEMAKER     INSERTION OF DIALYSIS CATHETER Left 04/22/2018   Procedure: INSERTION OF DIALYSIS CATHETER;  Surgeon: [name], MD;  Location: [place];  Service: Vascular;  Laterality: Left;   IR FLUORO GUIDE CV LINE LEFT  07/15/2018   IR PTA VENOUS EXCEPT DIALYSIS CIRCUIT  07/15/2018   LOWER EXTREMITY ANGIOGRAPHY Right 07/21/2018   Procedure: LOWER EXTREMITY ANGIOGRAPHY;  Surgeon: [name], MD;  Location: [place] [facility];   Service: Cardiovascular;  Laterality: Right;   LOWER EXTREMITY ANGIOGRAPHY Bilateral 02/25/2019   Procedure: Lower Extremity Angiography;  Surgeon: Elam Dutch, MD;  Location: [place] [facility];  Service: Cardiovascular;  Laterality: Bilateral;  METATARSAL HEAD EXCISION Right 07/15/2018   Procedure: METATARSAL RESECTION;  Surgeon: [name], DPM;  Location: [place];  Service: Podiatry;  Laterality: Right;   MULTIPLE TOOTH EXTRACTIONS     PERIPHERAL VASCULAR ATHERECTOMY Right 02/28/2019   Procedure: PERIPHERAL VASCULAR ATHERECTOMY;  Surgeon: Waynetta Sandy, MD;  Location: [place] [facility];  Service: Cardiovascular;  Laterality: Right;  right tp trunk    PERIPHERAL VASCULAR BALLOON ANGIOPLASTY Left 02/25/2019   Procedure: PERIPHERAL VASCULAR BALLOON ANGIOPLASTY;  Surgeon: Elam Dutch, MD;  Location: [place] [facility];  Service: Cardiovascular;  Laterality: Left;  tibial peroneal trunk and PT   PERIPHERAL VASCULAR INTERVENTION Right 07/21/2018   Procedure: PERIPHERAL VASCULAR INTERVENTION;  Surgeon: [name], MD;  Location: [place] [facility];  Service: Cardiovascular;  Laterality: Right;  peroneal stents   REVISON OF ARTERIOVENOUS FISTULA Right 01/15/3709   Procedure: Plication OF Right Arm ARTERIOVENOUS FISTULA;  Surgeon: Elam Dutch, MD;  Location: [place];  Service: Vascular;  Laterality: Right;   REVISON OF ARTERIOVENOUS FISTULA Left 04/22/2018   Procedure: REVISION OF RADIOCEPHALIC ARTERIOVENOUS FISTULA;  Surgeon: [name], MD;  Location: [place];  Service: Vascular;  Laterality: Left;   SHUNTOGRAM N/A 05/31/2013   Procedure: [place];  Surgeon: [name], MD;  Location: [facility];  Service: Cardiovascular;  Laterality: N/A;   UPPER EXTREMITY VENOGRAPHY Right 07/23/2018   Procedure: UPPER EXTREMITY VENOGRAPHY;  Surgeon: [name], MD;  Location: [place] [facility];  Service: Cardiovascular;  Laterality: Right;   UPPER  EXTREMITY VENOGRAPHY  07/15/2019   Procedure: UPPER EXTREMITY VENOGRAPHY;  Surgeon: Elam Dutch, MD;  Location: [place] [facility];  Service: Cardiovascular;;  rt arm    WOUND DEBRIDEMENT Right 07/17/2018   Procedure: Wound Debridement; Closure Filleted toe flap Right Foot;  Surgeon: [name], DPM;  Location: [place];  Service: Podiatry;  Laterality: Right;   WOUND DEBRIDEMENT Right 09/07/2018   Procedure: Debridement Right Foot Wound, application of wound vac;  Surgeon: [name], DPM;  Location: [place];  Service: Podiatry;  Laterality: Right;   WOUND DEBRIDEMENT Right 03/08/2019   Procedure: DEBRIDEMENT WOUND RIGHT FOOT;  Surgeon: [name], DPM;  Location: [place];  Service: Podiatry;  Laterality: Right;     Social History:   reports that he quit smoking about 28 years ago. His smoking use included cigarettes. He has a 22.50 pack-year smoking history. He has never used smokeless tobacco. He reports previous alcohol use. He reports previous drug use. Drug: Marijuana.   Family History:  His family history includes CAD in his father; Heart disease in his father.   Allergies No Known Allergies   Home Medications  Prior to Admission medications   Medication Sig Start Date End Date Taking? Authorizing Provider  acetaminophen (TYLENOL) 500 MG tablet Take 500 mg by mouth every 6 (six) hours as needed for mild pain or headache.     [provider]  Amino Acids-Protein Hydrolys (FEEDING SUPPLEMENT, PRO-STAT SUGAR FREE 64,) LIQD Take 30 mLs by mouth 2 (two) times daily. 03/03/19   Hongalgi, Lenis Dickinson, MD  aspirin (BAYER LOW DOSE) 81 MG EC tablet Take by mouth. 03/17/13   [provider]  AURYXIA 1 GM 210 MG(Fe) tablet Take 210 mg by mouth 3 (three) times daily.  06/08/19   [provider]  bisacodyl (DULCOLAX) 5 MG EC tablet Take 1 tablet (5 mg total) by mouth daily as needed for moderate  constipation. 04/17/19   [place] Shi, MD  calcitRIOL (ROCALTROL)  0.25 MCG capsule Take 0.25 mcg by mouth every Monday, Wednesday, and Friday.    [provider]  Calcium Carbonate Antacid (CALCIUM CARBONATE, DOSED IN MG ELEMENTAL CALCIUM,) 1250 MG/5ML SUSP Take 5 mLs (500 mg of elemental calcium total) by mouth every 6 (six) hours as needed for indigestion. 03/30/19   Angiulli, Lavon Paganini, PA-C  cinacalcet (SENSIPAR) 30 MG tablet Take 1 tablet (30 mg total) by mouth every other day. 03/30/19   Angiulli, Lavon Paganini, PA-C  CLONIDINE HCL PO Take by mouth. 05/23/20 05/22/21  [provider]  clopidogrel (PLAVIX) 75 MG tablet Take 1 tablet (75 mg total) by mouth daily with breakfast. 03/30/19   Angiulli, Lavon Paganini, PA-C  colchicine 0.6 MG tablet Take 0.6 mg by mouth daily as needed (as directed for gout flares).  02/26/18   [provider]  collagenase (SANTYL) ointment Apply topically daily. Wound measures 0.5x0.5. Apply to wound - edge to edge nickel thick.  Cover with wet gauze followed by dry gauze and change daily 05/15/20   [name], DPM  docusate sodium (COLACE) 100 MG capsule Take 2 capsules (200 mg total) by mouth 2 (two) times daily. 03/30/19   Angiulli, Lavon Paganini, PA-C  Doxercalciferol (HECTOROL IV) Doxercalciferol (Hectorol) 10/01/20 12/04/21  [provider]  heparin 1000 unit/mL SOLN injection Heparin Sodium (Porcine) 1,000 Units/mL Systemic 04/13/20 04/12/21  [provider]  HYDROcodone-acetaminophen (NORCO) 5-325 MG tablet Take 1 tablet by mouth every 6 (six) hours as needed for moderate pain. 08/09/19   Dagoberto Ligas, PA-C  lidocaine-prilocaine (EMLA) cream SMARTSIG:Sparingly Topical 05/11/20   [provider]  methocarbamol (ROBAXIN) 500 MG tablet Take 1 tablet (500 mg total) by mouth every 12 (twelve) hours as needed for muscle spasms. 03/30/19   Angiulli, Lavon Paganini, PA-C  midodrine (PROAMATINE) 10 MG tablet Take 1 tablet (10 mg total) by mouth every Monday, Wednesday, and Friday with hemodialysis. 03/30/19    Angiulli, Lavon Paganini, PA-C  oxyCODONE (OXY IR/ROXICODONE) 5 MG immediate release tablet Take by mouth.  04/19/19   [provider]  RENVELA 800 MG tablet Take 2 tablets by mouth in the morning, at noon, and at bedtime. 02/15/20   [provider]  rosuvastatin (CRESTOR) 10 MG tablet Take 1 tablet (10 mg total) by mouth daily at 6 PM. 03/30/19   Angiulli, Lavon Paganini, PA-C  silver sulfADIAZINE (SILVADENE) 1 % cream Apply pea-sized amount to wound daily. 05/12/19   [name], DPM     Critical care time: 56 Minutes     Noe Goyer Chancy Milroy., MSN, APRN, AGACNP-BC [facility]  02/25/2021 , 6:41 AM  Please see [email or web address] for pager details  If no response, please call [phone] After hours, please call Elink at [phone]

## 2021-02-17 NOTE — ED Provider Notes (Signed)
[facility] EMERGENCY DEPARTMENT Provider Note   CSN: [id number] Arrival date & time: 02/21/2021  0110     History Chief Complaint  Patient presents with   Code Sepsis    Timothy Faulkner is a 75 y.o. male.  Patient presents chief complaint of increased confusion and decreased energy at home.  He lives with his daughter at home.  His daughter states that he has been laying in bed the whole day.  He is been less responsive over the last 24 hours now.  Otherwise no reports of fevers at home.  No reports of cough or vomiting or diarrhea.  His last dialysis was this Friday.  His daughter states that he has had several times in the past where he becomes confused and she is concerned he may be happening again.  She states that he becomes confused maybe once every month or 2.  She states he was admitted for ultimately status about a year ago.  At baseline she states that he is able to hold normal conversations.      Past Medical History:  Diagnosis Date   Anemia    Arthritis    Gout   Automatic implantable cardioverter-defibrillator in situ    [employer]   CHF    ESRD on dialysis [facility])    [name] 03/11/2013 (03/11/2013) dialysis M/W/F   Gangrene (HCC)    left foot   GERD (gastroesophageal reflux disease)    Gout    "once a year"   Heart murmur    HYPERCHOLESTEROLEMIA, MIXED    HYPERTENSION    Macular degeneration    Osteomyelitis ([place])    right foot   Other primary cardiomyopathies 07/16/2011   Pacemaker    Peripheral arterial disease (HCC)    left fifth toe ulcer, healing   Pneumonia    Shortness of breath    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)    left fifth toe   Wears dentures    Wears glasses     Patient Active Problem List   Diagnosis Date Noted   Polycythemia, secondary 02/21/2020   Stable treated proliferative diabetic retinopathy of right eye determined by examination associated with type 2 diabetes mellitus ([place])  11/29/2019   Stable treated proliferative diabetic retinopathy of left eye without macular edema determined by examination associated with type 2 diabetes mellitus ([place]) 11/29/2019   Advanced nonexudative age-related macular degeneration of both eyes with subfoveal involvement 11/29/2019   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   Sepsis ([place]) 04/14/2019   Hypocalcemia 04/08/2019   Drug induced constipation    Anemia of chronic disease    ESRD on dialysis ([place])    Postoperative pain    Phantom limb pain (HCC)    S/P BKA (below knee amputation), left ([place]) 03/18/2019   Unilateral complete BKA, left, initial encounter ([place]) 03/17/2019   Critical lower limb ischemia ([place]) 03/12/2019   Infection of amputation stump, right lower extremity ([place]) 03/10/2019   Hypotension 02/23/2019   Cellulitis of left foot 02/23/2019   Gangrene of toe of left foot ([place]) 61/60/7371   Complication of vascular dialysis catheter 11/24/2018   Chronic osteomyelitis involving right ankle and foot ([place])    Ulcer of right foot with fat layer exposed ([place]) 09/02/2018   Encounter for planned post-operative wound closure    Acute osteomyelitis of metatarsal bone of right foot ([place])    Diabetic ulcer of midfoot associated with diabetes mellitus due to underlying condition,  with necrosis of bone ([place])    Osteomyelitis of right foot [facility])    Vascular calcification    Cellulitis and abscess of foot, except toes    Anemia associated with chronic renal failure 07/13/2018   Cardiomyopathy, dilated, 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   Diabetic foot infection ([place]) 07/13/2018   PAD (peripheral artery disease) ([place])    Macular degeneration    Ventricular tachycardia ([place]) 06/24/2017   Embolism due to vascular prosthetic devices, implants and grafts, initial encounter ([place]) 02/04/2016   Dependence on renal dialysis ([place]) 05/30/2015   Shortness of breath 26/71/2458   Chronic systolic heart failure ([place]) 08/22/2014    Diabetic infection of right foot ([place]) 07/13/2014   Type 2 diabetes mellitus with diabetic peripheral angiopathy without gangrene ([place]) 03/18/2014   Anxiety disorder, unspecified 02/23/2014   Fever, unspecified 02/23/2014   Headache 02/23/2014   Pain, unspecified 02/23/2014   Pruritus, unspecified 02/23/2014   Mild protein-calorie malnutrition ([place]) 12/26/2013   ESRD (end stage renal disease) on dialysis ([place]) 11/11/2013   Snoring 11/11/2013   Unspecified sleep apnea 11/11/2013   Other pancytopenia ([place]) 09/22/2013   Hemoptysis 09/20/2013   Personal history of nicotine dependence 07/12/2013   Coagulation defect, unspecified ([place]) 05/16/2013   Iron deficiency anemia, unspecified 05/06/2013   Other disorders of plasma-protein metabolism, not elsewhere classified 04/15/2013   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 chronic kidney disease or end stage renal disease ([place]) 03/18/2013   Other cardiomyopathies ([place]) 03/18/2013   Secondary hyperparathyroidism of renal origin ([place]) 03/18/2013   Type 2 diabetes mellitus without complications ([place]) 09/98/3382   Pre-transplant evaluation for kidney transplant 01/11/2013   Automatic implantable cardioverter-defibrillator in situ 10/01/2010   HYPERCHOLESTEROLEMIA, MIXED 05/03/2010   Essential hypertension 05/03/2010   CHF 05/03/2010    Past Surgical History:  Procedure Laterality Date   A/V FISTULAGRAM Left 07/20/2018   Procedure: A/V FISTULAGRAM;  Surgeon: [name], MD;  Location: [place] [facility];  Service: Cardiovascular;  Laterality: Left;   A/V FISTULAGRAM N/A 07/15/2019   Procedure: A/V FISTULAGRAM - Left Arm;  Surgeon: Elam Dutch, MD;  Location: [place] [facility];  Service: Cardiovascular;  Laterality: N/A;   ABDOMINAL AORTOGRAM N/A 02/25/2019   Procedure: ABDOMINAL AORTOGRAM;  Surgeon: Elam Dutch, MD;  Location: [place] [facility];  Service: Cardiovascular;  Laterality: N/A;   ABDOMINAL AORTOGRAM W/LOWER  EXTREMITY Bilateral 07/20/2018   Procedure: ABDOMINAL AORTOGRAM W/LOWER EXTREMITY;  Surgeon: [name], MD;  Location: [place] [facility];  Service: Cardiovascular;  Laterality: Bilateral;   AMPUTATION Right 09/07/2018   Procedure: Right fifth metatarsectomy;  Surgeon: [name], DPM;  Location: [place];  Service: Podiatry;  Laterality: Right;   AMPUTATION Left 03/08/2019   Procedure: AMPUTATION  3RD AND 4TH TOES LEFT FOOT;  Surgeon: [name], DPM;  Location: [place];  Service: Podiatry;  Laterality: Left;   AMPUTATION Left 03/15/2019   Procedure: AMPUTATION BELOW KNEE;  Surgeon: Elam Dutch, MD;  Location[facility] OR;  Service: Vascular;  Laterality: Left;   AV FISTULA PLACEMENT Right 12/13/2012   Procedure: ARTERIOVENOUS (AV) FISTULA CREATION;  Surgeon: [name], MD;  Location: [place];  Service: Vascular;  Laterality: Right;  Ultrasound guided   AV FISTULA PLACEMENT Left 05/07/2016   Procedure: LEFT RADIOCEPHALIC ARTERIOVENOUS (AV) FISTULA CREATION;  Surgeon: [name], MD;  Location: [place];  Service: Vascular;  Laterality: Left;   AV FISTULA PLACEMENT Right 08/09/2019   Procedure: INSERTION OF RIGHT  ARTERIOVENOUS (AV) 4-62mm GORE-TEX STRETCH GRAFT RIGHT  ARM;  Surgeon: Elam Dutch, MD;  Location: [place];  Service: Vascular;  Laterality: Right;   [place] Right 03/26/2016   Procedure: RIGHT BASILIC VEIN TRANSPOSITION;  Surgeon: [name], MD;  Location: [place];  Service: Vascular;  Laterality: Right;   CARDIAC CATHETERIZATION     CARDIAC DEFIBRILLATOR PLACEMENT     [employer]   CATARACT EXTRACTION W/PHACO Bilateral    EYE SURGERY Bilateral    Cataract   FISTULOGRAM Left 04/22/2018   Procedure: FISTULOGRAM UPPER EXTREMITY;  Surgeon: [name], MD;  Location: [place];  Service: Vascular;  Laterality: Left;   I & D EXTREMITY Right 07/15/2018   Procedure: IRRIGATION AND DEBRIDEMENT RIGHT FOOT;  Surgeon: [name], DPM;  Location:  [place];  Service: Podiatry;  Laterality: Right;   INCISION AND DRAINAGE ABSCESS / HEMATOMA OF BURSA / KNEE / THIGH Left 2012   "knee" (03/11/2013)   INSERT / REPLACE / REMOVE PACEMAKER     INSERTION OF DIALYSIS CATHETER Left 04/22/2018   Procedure: INSERTION OF DIALYSIS CATHETER;  Surgeon: [name], MD;  Location: [place];  Service: Vascular;  Laterality: Left;   IR FLUORO GUIDE CV LINE LEFT  07/15/2018   IR PTA VENOUS EXCEPT DIALYSIS CIRCUIT  07/15/2018   LOWER EXTREMITY ANGIOGRAPHY Right 07/21/2018   Procedure: LOWER EXTREMITY ANGIOGRAPHY;  Surgeon: [name], MD;  Location: [place] [facility];  Service: Cardiovascular;  Laterality: Right;   LOWER EXTREMITY ANGIOGRAPHY Bilateral 02/25/2019   Procedure: Lower Extremity Angiography;  Surgeon: Elam Dutch, MD;  Location: [place] [facility];  Service: Cardiovascular;  Laterality: Bilateral;   METATARSAL HEAD EXCISION Right 07/15/2018   Procedure: METATARSAL RESECTION;  Surgeon: [name], DPM;  Location: [place];  Service: Podiatry;  Laterality: Right;   MULTIPLE TOOTH EXTRACTIONS     PERIPHERAL VASCULAR ATHERECTOMY Right 02/28/2019   Procedure: PERIPHERAL VASCULAR ATHERECTOMY;  Surgeon: Waynetta Sandy, MD;  Location: [place] [facility];  Service: Cardiovascular;  Laterality: Right;  right tp trunk    PERIPHERAL VASCULAR BALLOON ANGIOPLASTY Left 02/25/2019   Procedure: PERIPHERAL VASCULAR BALLOON ANGIOPLASTY;  Surgeon: Elam Dutch, MD;  Location: [place] [facility];  Service: Cardiovascular;  Laterality: Left;  tibial peroneal trunk and PT   PERIPHERAL VASCULAR INTERVENTION Right 07/21/2018   Procedure: PERIPHERAL VASCULAR INTERVENTION;  Surgeon: [name], MD;  Location: [place] [facility];  Service: Cardiovascular;  Laterality: Right;  peroneal stents   REVISON OF ARTERIOVENOUS FISTULA Right 7/82/4235   Procedure: Plication OF Right Arm ARTERIOVENOUS FISTULA;  Surgeon: Elam Dutch, MD;   Location: [place];  Service: Vascular;  Laterality: Right;   REVISON OF ARTERIOVENOUS FISTULA Left 04/22/2018   Procedure: REVISION OF RADIOCEPHALIC ARTERIOVENOUS FISTULA;  Surgeon: [name], MD;  Location: [place];  Service: Vascular;  Laterality: Left;   SHUNTOGRAM N/A 05/31/2013   Procedure: [place] Mal[place];  Surgeon: [name], MD;  Location: [facility] LAB;  Service: Cardiovascular;  Laterality: N/A;   UPPER EXTREMITY VENOGRAPHY Right 07/23/2018   Procedure: UPPER EXTREMITY VENOGRAPHY;  Surgeon: [name], MD;  Location: [place] [facility];  Service: Cardiovascular;  Laterality: Right;   UPPER EXTREMITY VENOGRAPHY  07/15/2019   Procedure: UPPER EXTREMITY VENOGRAPHY;  Surgeon: Elam Dutch, MD;  Location: [place] [facility];  Service: Cardiovascular;;  rt arm    WOUND DEBRIDEMENT Right 07/17/2018   Procedure: Wound  Debridement; Closure Filleted toe flap Right Foot;  Surgeon: [name], DPM;  Location: [place];  Service: Podiatry;  Laterality: Right;   WOUND DEBRIDEMENT Right 09/07/2018   Procedure: Debridement Right Foot Wound, application of wound vac;  Surgeon: [name], DPM;  Location: [place];  Service: Podiatry;  Laterality: Right;   WOUND DEBRIDEMENT Right 03/08/2019   Procedure: DEBRIDEMENT WOUND RIGHT FOOT;  Surgeon: [name], DPM;  Location: [place];  Service: Podiatry;  Laterality: Right;       Family History  Problem Relation Age of Onset   Heart disease Father    CAD Father     Social History   Tobacco Use   Smoking status: Former    Packs/day: 0.75    Years: 30.00    Pack years: 22.50    Types: Cigarettes    Quit date: 11/25/1992    Years since quitting: 28.2   Smokeless tobacco: Never  Vaping Use   Vaping Use: Never used  Substance Use Topics   Alcohol use: Not Currently   Drug use: Not Currently    Types: Marijuana    Comment: last use 10 years ago    Home Medications Prior to Admission medications   Medication Sig  Start Date End Date Taking? Authorizing Provider  acetaminophen (TYLENOL) 500 MG tablet Take 500 mg by mouth every 6 (six) hours as needed for mild pain or headache.     [provider]  Amino Acids-Protein Hydrolys (FEEDING SUPPLEMENT, PRO-STAT SUGAR FREE 64,) LIQD Take 30 mLs by mouth 2 (two) times daily. 03/03/19   Hongalgi, Lenis Dickinson, MD  aspirin (BAYER LOW DOSE) 81 MG EC tablet Take by mouth. 03/17/13   [provider]  AURYXIA 1 GM 210 MG(Fe) tablet Take 210 mg by mouth 3 (three) times daily.  06/08/19   [provider]  bisacodyl (DULCOLAX) 5 MG EC tablet Take 1 tablet (5 mg total) by mouth daily as needed for moderate constipation. 04/17/19   [place] Shi, MD  calcitRIOL (ROCALTROL) 0.25 MCG capsule Take 0.25 mcg by mouth every Monday, Wednesday, and Friday.    [provider]  Calcium Carbonate Antacid (CALCIUM CARBONATE, DOSED IN MG ELEMENTAL CALCIUM,) 1250 MG/5ML SUSP Take 5 mLs (500 mg of elemental calcium total) by mouth every 6 (six) hours as needed for indigestion. 03/30/19   Angiulli, Lavon Paganini, PA-C  cinacalcet (SENSIPAR) 30 MG tablet Take 1 tablet (30 mg total) by mouth every other day. 03/30/19   Angiulli, Lavon Paganini, PA-C  CLONIDINE HCL PO Take by mouth. 05/23/20 05/22/21  [provider]  clopidogrel (PLAVIX) 75 MG tablet Take 1 tablet (75 mg total) by mouth daily with breakfast. 03/30/19   Angiulli, Lavon Paganini, PA-C  colchicine 0.6 MG tablet Take 0.6 mg by mouth daily as needed (as directed for gout flares).  02/26/18   [provider]  collagenase (SANTYL) ointment Apply topically daily. Wound measures 0.5x0.5. Apply to wound - edge to edge nickel thick.  Cover with wet gauze followed by dry gauze and change daily 05/15/20   [name], DPM  docusate sodium (COLACE) 100 MG capsule Take 2 capsules (200 mg total) by mouth 2 (two) times daily. 03/30/19   Angiulli, Lavon Paganini, PA-C  Doxercalciferol (HECTOROL IV) Doxercalciferol (Hectorol)  10/01/20 12/04/21  [provider]  heparin 1000 unit/mL SOLN injection Heparin Sodium (Porcine) 1,000 Units/mL Systemic 04/13/20 04/12/21  [provider]  HYDROcodone-acetaminophen (NORCO) 5-325 MG tablet Take 1 tablet by  mouth every 6 (six) hours as needed for moderate pain. 08/09/19   Dagoberto Ligas, PA-C  lidocaine-prilocaine (EMLA) cream SMARTSIG:Sparingly Topical 05/11/20   [provider]  methocarbamol (ROBAXIN) 500 MG tablet Take 1 tablet (500 mg total) by mouth every 12 (twelve) hours as needed for muscle spasms. 03/30/19   Angiulli, Lavon Paganini, PA-C  midodrine (PROAMATINE) 10 MG tablet Take 1 tablet (10 mg total) by mouth every Monday, Wednesday, and Friday with hemodialysis. 03/30/19   Angiulli, Lavon Paganini, PA-C  oxyCODONE (OXY IR/ROXICODONE) 5 MG immediate release tablet Take by mouth.  04/19/19   [provider]  RENVELA 800 MG tablet Take 2 tablets by mouth in the morning, at noon, and at bedtime. 02/15/20   [provider]  rosuvastatin (CRESTOR) 10 MG tablet Take 1 tablet (10 mg total) by mouth daily at 6 PM. 03/30/19   Angiulli, Lavon Paganini, PA-C  silver sulfADIAZINE (SILVADENE) 1 % cream Apply pea-sized amount to wound daily. 05/12/19   [name], DPM    Allergies    Patient has no known allergies.  Review of Systems   Review of Systems  Unable to perform ROS: Mental status change   Physical Exam Updated Vital Signs BP (!) 144/78 (BP Location: Left Leg)   Pulse (!) 42   Temp (!) 103 F (39.4 C) (Oral)   Resp (!) 22   Ht 5\' 8"  (1.727 m)   Wt 71.7 kg Comment: from Mar 2022 records  SpO2 (!) 82%   BMI 24.02 kg/m   Physical Exam Constitutional:      Appearance: He is well-developed.     Comments: Patient appears agitated, confused.  HENT:     Head: Normocephalic.     Nose: Nose normal.  Eyes:     Extraocular Movements: Extraocular movements intact.  Neck:     Comments: Patient ranging his neck well with no signs of nuchal  rigidity or meningismal signs. Cardiovascular:     Rate and Rhythm: Normal rate.  Pulmonary:     Effort: Pulmonary effort is normal.  Skin:    Coloration: Skin is not jaundiced.  Neurological:     Comments: Patient moving all extremities.  He has intermittent episodes of rapid breathing and then appears to calm down.  With repetitive questioning he will answer his name and answer who he lives with but otherwise starts to get agitated and unresponsive.    ED Results / Procedures / Treatments   Labs (all labs ordered are listed, but only abnormal results are displayed) Labs Reviewed  COMPREHENSIVE METABOLIC PANEL - Abnormal; Notable for the following components:      Result Value   Chloride 94 (*)    CO2 21 (*)    BUN 40 (*)    Creatinine, Ser 8.85 (*)    Total Bilirubin 1.6 (*)    GFR, Estimated 6 (*)    Anion gap 21 (*)    All other components within normal limits  LACTIC ACID, PLASMA - Abnormal; Notable for the following components:   Lactic Acid, Venous 8.6 (*)    All other components within normal limits  LACTIC ACID, PLASMA - Abnormal; Notable for the following components:   Lactic Acid, Venous 6.6 (*)    All other components within normal limits  CBC WITH DIFFERENTIAL/PLATELET - Abnormal; Notable for the following components:   WBC 12.3 (*)    RBC 4.04 (*)    MCV 106.9 (*)    RDW 15.7 (*)  All other components within normal limits  PROTIME-INR - Abnormal; Notable for the following components:   Prothrombin Time 16.8 (*)    INR 1.4 (*)    All other components within normal limits  I-STAT ARTERIAL BLOOD GAS, ED - Abnormal; Notable for the following components:   pH, Arterial 7.627 (*)    pCO2 arterial 21.2 (*)    Acid-Base Excess 4.0 (*)    Calcium, Ion 1.09 (*)    All other components within normal limits  RESP PANEL BY RT-PCR (FLU A&B, COVID) ARPGX2  CULTURE, BLOOD (ROUTINE X 2)  CULTURE, BLOOD (ROUTINE X 2)  AMMONIA  URINALYSIS, ROUTINE W REFLEX MICROSCOPIC   CBG MONITORING, ED    EKG EKG Interpretation  Date/Time:  Sunday February 17 2021 02:10:38 EDT Ventricular Rate:  81 PR Interval:  59 QRS Duration: 117 QT Interval:  458 QTC Calculation: 505 R Axis:   -86 Text Interpretation: Sinus rhythm Atrial premature complexes Short PR interval Probable left atrial enlargement Left anterior fascicular block Low voltage, extremity leads Nonspecific T abnormalities, lateral leads Confirmed by Thamas Jaegers ([id number]) on 03/04/2021 2:42:51 AM  Radiology DG Chest 1 View  Result Date: 02/09/2021 CLINICAL DATA:  Chest pain EXAM: CHEST  1 VIEW COMPARISON:  04/14/2019 FINDINGS: Cardiac shadow is stable. Defibrillator is again noted and stable. Dialysis catheter has been removed in the interval. Lungs are well aerated bilaterally. Mild central vascular congestion is seen without edema. No focal infiltrate is seen. Stenting in the right are is again identified and stable. IMPRESSION: Mild vascular congestion without interstitial edema. No other focal abnormality is noted. Electronically Signed   By: Inez Catalina M.D.   On: 02/16/2021 01:58   CT Head Wo Contrast  Result Date: 02/27/2021 CLINICAL DATA:  Altered mental status EXAM: CT HEAD WITHOUT CONTRAST TECHNIQUE: Contiguous axial images were obtained from the base of the skull through the vertex without intravenous contrast. COMPARISON:  04/14/2019 FINDINGS: Brain: Somewhat limited due to patient motion artifact. Chronic atrophic and ischemic changes are noted. No findings to suggest acute hemorrhage, acute infarction or space-occupying mass lesion are noted. Vascular: No hyperdense vessel or unexpected calcification. Skull: Normal. Negative for fracture or focal lesion. Sinuses/Orbits: No acute finding. Other: None. IMPRESSION: Chronic atrophic and ischemic changes without acute abnormality. Electronically Signed   By: Inez Catalina M.D.   On: 02/27/2021 03:22    Procedures .Critical Care  Date/Time: 02/22/2021 5:18  AM Performed by: Luna Fuse, MD Authorized by: Luna Fuse, MD   Critical care provider statement:    Critical care time (minutes):  45   Critical care time was exclusive of:  Separately billable procedures and treating other patients   Critical care was necessary to treat or prevent imminent or life-threatening deterioration of the following conditions:  Metabolic crisis and sepsis   Critical care was time spent personally by me on the following activities:  Discussions with consultants, evaluation of patient's response to treatment, examination of patient, ordering and performing treatments and interventions, ordering and review of laboratory studies, ordering and review of radiographic studies, pulse oximetry, re-evaluation of patient's condition, obtaining history from patient or surrogate and review of old charts   Medications Ordered in ED Medications  vancomycin (VANCOREADY) IVPB 1500 mg/300 mL (1,500 mg Intravenous New Bag/Given 03/02/2021 0430)  acetaminophen (TYLENOL) suppository 650 mg (650 mg Rectal Given 03/10/2021 0455)  sodium chloride 0.9 % bolus 2,151 mL (1,000 mLs Intravenous New Bag/Given 02/17/21 0400)  piperacillin-tazobactam (ZOSYN) IVPB 3.375 g (  0 g Intravenous Stopped 02/16/2021 0429)    ED Course  I have reviewed the triage vital signs and the nursing notes.  Pertinent labs & imaging results that were available during my care of the patient were reviewed by me and considered in my medical decision making (see chart for details).    MDM Rules/Calculators/A&P                          Labs sent.  White count of 12.  However lactic acid significant elevated 8.  Blood pressure appears normal in the 438P systolic range.  ABG shows pH of 7.66.  Patient given sepsis IV fluid resuscitation due to high lactic acidosis with fevers today.  Source is unclear.  Family states that he does not make any urine.  Chest x-ray is unremarkable.  Given his altered mental status and  alkalosis, patient will be admitted to the ICU.  Final Clinical Impression(s) / ED Diagnoses Final diagnoses:  Sepsis, due to unspecified organism, unspecified whether acute organ dysfunction present ([place])  Lactic acidosis  Respiratory alkalosis    Rx / DC Orders ED Discharge Orders     None        Luna Fuse, MD 03/03/2021 [phone]

## 2021-02-17 NOTE — Progress Notes (Signed)
PCCM Progress note  Called to bedside by RN stating patient has become progressively bradycardic. On arrival to bedside patient is seen in sinus brady with a rate fluctuating between 47-52. He is currently getting another push of 50% dextrose for hypoglycemia. He is mentating well and hemodynamically stable.   ECHO recently completed, formal read pending.   No acute interventions currently.   Jaquavion Mccannon D. Kenton Kingfisher, NP-C  Pulmonary & Critical Care Personal contact information can be found on Amion  03/10/2021, 12:32 PM

## 2021-02-17 NOTE — Consult Note (Signed)
ESRD Consult Note  Requesting provider: Collier Bullock, MD  Outpatient dialysis unit: [facility] Outpatient dialysis schedule: MWF  Assessment/Recommendations:   # ESRD:  Outpatient orders: [facility], mwf, 3hrs 58min, f180, 400/1.5 auto, edw 71.5kg, 2k, 2cal, 137na, 35 bicarb, heparin 5k units bolus, 3k units mid-run -plan for HD tomorrow, no urgent indications for renal replacement therapy today  # Acute metabolic encephalopathy -Likely related to sepsis? Per primary -no acute abnormalities on cth 7/10  # Sepsis, possible biliary source -Antibiotics and infectious work-up per primary service  # AGMA/lactic acidosis (improving) -limited in regards to fluid options, if to further receive fluid resuscitation, please monitor volume status closely. Overall, seems euvolemic on exam this AM  # Volume/ hypertension: EDW 71.5kg. Will UF as tolerated  # Anemia of Chronic Kidney Disease: Hemoglobin 12.2. not on esa's.  # Secondary Hyperparathyroidism/Hyperphosphatemia: hectorol 34mcg qtx, sensipar 30mg  post HD.   # Vascular access: AVG RUE  # Additional recommendations: - Dose all meds for creatinine clearance < 10 ml/min  - Unless absolutely necessary, no MRIs with gadolinium.  - Implement save arm precautions.  Prefer needle sticks in the dorsum of the hands or wrists.  No blood pressure measurements in arm. - If blood transfusion is requested during hemodialysis sessions, please alert [place] prior to the session.  - If a hemodialysis catheter line culture is requested, please alert [place] as only hemodialysis nurses are able to collect those specimens.   Recommendations were discussed with the primary team.  Gean Quint, MD [facility]  History of Present Illness: Timothy Faulkner is a/an 75 y.o. male with a past medical history of ESRD, DM 2, hypertension, CHF status post AICD, PAD status post left BKA, GERD, hyperlipidemia who presents with altered mental status.  Between  Friday and Saturday he been increasingly altered and remained in bed all day.  Was found to have a fever of 103 here, WBC 12.3, lactate 8.6.  He was found to have hyperbilirubinemia.  He received a dose of vancomycin and Zosyn as well as 500 cc of fluids. Last HD Friday 7/8 per review of ecube ROS unobtainable-AMS  Medications:  Current Facility-Administered Medications  Medication Dose Route Frequency Provider Last Rate Last Admin   acetaminophen (TYLENOL) suppository 650 mg  650 mg Rectal Q6H PRN Estill Cotta, NP       aspirin chewable tablet 81 mg  81 mg Per Tube Daily Estill Cotta, NP       clopidogrel (PLAVIX) tablet 75 mg  75 mg Per Tube Daily Estill Cotta, NP       docusate sodium (COLACE) capsule 100 mg  100 mg Oral BID PRN Estill Cotta, NP       heparin injection 5,000 Units  5,000 Units Subcutaneous Q8H Estill Cotta, NP   5,000 Units at 02/11/2021 0651   insulin aspart (novoLOG) injection 0-6 Units  0-6 Units Subcutaneous Q4H Estill Cotta, NP       pantoprazole (PROTONIX) injection 40 mg  40 mg Intravenous QHS Estill Cotta, NP       polyethylene glycol (MIRALAX / GLYCOLAX) packet 17 g  17 g Oral Daily PRN Estill Cotta, NP         ALLERGIES Patient has no known allergies.  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   Pacemaker    Peripheral arterial disease (HCC)    left fifth toe ulcer, healing   Pneumonia    Shortness of breath    Type 2 diabetes mellitus with left  diabetic foot ulcer (HCC)    left fifth toe   Wears dentures    Wears glasses      SOCIAL HISTORY Social History   Socioeconomic History   Marital status: Married    Spouse name: [name]   Number of children: 2   Years of education: 13   Highest education level: Not on file  Occupational History   Occupation: diasbled  Tobacco Use   Smoking status: Former    Packs/day: 0.75    Years: 30.00    Pack years: 22.50    Types: Cigarettes    Quit date: 11/25/1992    Years since quitting: 28.2   Smokeless tobacco: Never  Vaping Use   Vaping Use: Never used  Substance and Sexual Activity   Alcohol use: Not Currently   Drug use: Not Currently    Types: Marijuana    Comment: last use 10 years ago   Sexual activity: Yes  Other Topics Concern   Not on file  Social History Narrative   Pt lives in single story home with his wife and daughter   Has 2 children   Some college education   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[employer]"      Social Determinants of Radio broadcast assistant Strain: Not on file  Food Insecurity: Not on file  Transportation Needs: Not on file  Physical Activity: Not on file  Stress: Not on file  Social Connections: Not on file  Intimate Partner Violence: Not on file     FAMILY HISTORY Family History  Problem Relation Age of Onset   Heart disease Father    CAD Father      Review of Systems: 12 systems were reviewed and negative except per HPI  Physical Exam: Vitals:   03/09/2021 0740 03/05/2021 0820  BP: 111/61 125/80  Pulse: (!) 57 (!) 57  Resp: (!) 22 (!) 46  Temp: (!) 101.3 F (38.5 C)   SpO2:  94%   No intake/output data recorded.  Intake/Output Summary (Last 24 hours) at 03/04/2021 0910 Last data filed at 02/27/2021 0650 Gross per 24 hour  Intake 600 ml  Output --  Net 600 ml   General: nad, chronically ill appearing HEENT: anicteric sclera, MMM CV: normal rate, no murmurs Lungs: bilateral chest rise, normal wob Abd: soft,  non-tender, non-distended Ext: mult toe amputations, no edema Neuro: not following commands, awake, altered HD access: rue avg  Test Results Reviewed Lab Results  Component Value Date   NA 135 03/10/2021   K 4.6 02/19/2021   CL 94 (L) 02/21/2021   CO2 21 (L) 02/27/2021   BUN 40 (H) 02/25/2021   CREATININE 8.86 (H) 02/16/2021   CALCIUM 10.0 02/17/2021   ALBUMIN 3.5 02/17/2021   PHOS 4.4 03/28/2019    I have reviewed relevant outside healthcare records

## 2021-02-18 ENCOUNTER — Inpatient Hospital Stay (HOSPITAL_COMMUNITY): Payer: Medicare Other

## 2021-02-18 DIAGNOSIS — A419 Sepsis, unspecified organism: Secondary | ICD-10-CM | POA: Diagnosis not present

## 2021-02-18 DIAGNOSIS — I462 Cardiac arrest due to underlying cardiac condition: Secondary | ICD-10-CM

## 2021-02-18 DIAGNOSIS — B9561 Methicillin susceptible Staphylococcus aureus infection as the cause of diseases classified elsewhere: Secondary | ICD-10-CM

## 2021-02-18 DIAGNOSIS — R6521 Severe sepsis with septic shock: Secondary | ICD-10-CM | POA: Diagnosis not present

## 2021-02-18 DIAGNOSIS — I5023 Acute on chronic systolic (congestive) heart failure: Secondary | ICD-10-CM

## 2021-02-18 DIAGNOSIS — E873 Alkalosis: Secondary | ICD-10-CM | POA: Diagnosis not present

## 2021-02-18 DIAGNOSIS — J9600 Acute respiratory failure, unspecified whether with hypoxia or hypercapnia: Secondary | ICD-10-CM

## 2021-02-18 DIAGNOSIS — R778 Other specified abnormalities of plasma proteins: Secondary | ICD-10-CM

## 2021-02-18 LAB — POCT I-STAT 7, (LYTES, BLD GAS, ICA,H+H)
Acid-Base Excess: 0 mmol/L (ref 0.0–2.0)
Acid-base deficit: 4 mmol/L — ABNORMAL HIGH (ref 0.0–2.0)
Bicarbonate: 26.3 mmol/L (ref 20.0–28.0)
Bicarbonate: 20 mmol/L (ref 20.0–28.0)
Calcium, Ion: 1.2 mmol/L (ref 1.15–1.40)
Calcium, Ion: 1.1 mmol/L — ABNORMAL LOW (ref 1.15–1.40)
HCT: 41 % (ref 39.0–52.0)
HCT: 42 % (ref 39.0–52.0)
Hemoglobin: 13.9 g/dL (ref 13.0–17.0)
Hemoglobin: 14.3 g/dL (ref 13.0–17.0)
O2 Saturation: 96 %
O2 Saturation: 95 %
Patient temperature: 98
Patient temperature: 99.3
Potassium: 3.8 mmol/L (ref 3.5–5.1)
Potassium: 3.7 mmol/L (ref 3.5–5.1)
Sodium: 139 mmol/L (ref 135–145)
Sodium: 136 mmol/L (ref 135–145)
TCO2: 28 mmol/L (ref 22–32)
TCO2: 21 mmol/L — ABNORMAL LOW (ref 22–32)
pCO2 arterial: 49.6 mmHg — ABNORMAL HIGH (ref 32.0–48.0)
pCO2 arterial: 34.7 mmHg (ref 32.0–48.0)
pH, Arterial: 7.331 — ABNORMAL LOW (ref 7.350–7.450)
pH, Arterial: 7.371 (ref 7.350–7.450)
pO2, Arterial: 85 mmHg (ref 83.0–108.0)
pO2, Arterial: 80 mmHg — ABNORMAL LOW (ref 83.0–108.0)

## 2021-02-18 LAB — CBC
HCT: 38 % — ABNORMAL LOW (ref 39.0–52.0)
HCT: 39.3 % (ref 39.0–52.0)
Hemoglobin: 12.4 g/dL — ABNORMAL LOW (ref 13.0–17.0)
Hemoglobin: 13.1 g/dL (ref 13.0–17.0)
MCH: 33.3 pg (ref 26.0–34.0)
MCH: 33.4 pg (ref 26.0–34.0)
MCHC: 32.6 g/dL (ref 30.0–36.0)
MCHC: 33.3 g/dL (ref 30.0–36.0)
MCV: 102.2 fL — ABNORMAL HIGH (ref 80.0–100.0)
MCV: 100.3 fL — ABNORMAL HIGH (ref 80.0–100.0)
Platelets: 72 10*3/uL — ABNORMAL LOW (ref 150–400)
Platelets: 88 10*3/uL — ABNORMAL LOW (ref 150–400)
RBC: 3.72 MIL/uL — ABNORMAL LOW (ref 4.22–5.81)
RBC: 3.92 MIL/uL — ABNORMAL LOW (ref 4.22–5.81)
RDW: 15.3 % (ref 11.5–15.5)
RDW: 15.4 % (ref 11.5–15.5)
WBC: 10.3 10*3/uL (ref 4.0–10.5)
WBC: 14.4 10*3/uL — ABNORMAL HIGH (ref 4.0–10.5)
nRBC: 0 % (ref 0.0–0.2)
nRBC: 0 % (ref 0.0–0.2)

## 2021-02-18 LAB — HEPATIC FUNCTION PANEL
ALT: 23 U/L (ref 0–44)
AST: 71 U/L — ABNORMAL HIGH (ref 15–41)
Albumin: 3.2 g/dL — ABNORMAL LOW (ref 3.5–5.0)
Alkaline Phosphatase: 57 U/L (ref 38–126)
Bilirubin, Direct: 0.5 mg/dL — ABNORMAL HIGH (ref 0.0–0.2)
Indirect Bilirubin: 1.1 mg/dL — ABNORMAL HIGH (ref 0.3–0.9)
Total Bilirubin: 1.6 mg/dL — ABNORMAL HIGH (ref 0.3–1.2)
Total Protein: 7.2 g/dL (ref 6.5–8.1)

## 2021-02-18 LAB — TROPONIN I (HIGH SENSITIVITY): Troponin I (High Sensitivity): 1204 ng/L (ref <18)

## 2021-02-18 LAB — GLUCOSE, CAPILLARY
Glucose-Capillary: 51 mg/dL — ABNORMAL LOW (ref 70–99)
Glucose-Capillary: 73 mg/dL (ref 70–99)
Glucose-Capillary: 88 mg/dL (ref 70–99)

## 2021-02-18 LAB — BASIC METABOLIC PANEL
Anion gap: 15 (ref 5–15)
BUN: 68 mg/dL — ABNORMAL HIGH (ref 8–23)
CO2: 24 mmol/L (ref 22–32)
Calcium: 8.9 mg/dL (ref 8.9–10.3)
Chloride: 94 mmol/L — ABNORMAL LOW (ref 98–111)
Creatinine, Ser: 9.54 mg/dL — ABNORMAL HIGH (ref 0.61–1.24)
GFR, Estimated: 5 mL/min — ABNORMAL LOW (ref >60)
Glucose, Bld: 100 mg/dL — ABNORMAL HIGH (ref 70–99)
Potassium: 4.5 mmol/L (ref 3.5–5.1)
Sodium: 133 mmol/L — ABNORMAL LOW (ref 135–145)

## 2021-02-18 LAB — RENAL FUNCTION PANEL
Albumin: 3.3 g/dL — ABNORMAL LOW (ref 3.5–5.0)
Anion gap: 16 — ABNORMAL HIGH (ref 5–15)
BUN: 26 mg/dL — ABNORMAL HIGH (ref 8–23)
CO2: 22 mmol/L (ref 22–32)
Calcium: 8.9 mg/dL (ref 8.9–10.3)
Chloride: 96 mmol/L — ABNORMAL LOW (ref 98–111)
Creatinine, Ser: 5.33 mg/dL — ABNORMAL HIGH (ref 0.61–1.24)
GFR, Estimated: 11 mL/min — ABNORMAL LOW (ref >60)
Glucose, Bld: 206 mg/dL — ABNORMAL HIGH (ref 70–99)
Phosphorus: 3.4 mg/dL (ref 2.5–4.6)
Potassium: 3.8 mmol/L (ref 3.5–5.1)
Sodium: 134 mmol/L — ABNORMAL LOW (ref 135–145)

## 2021-02-18 LAB — PROTIME-INR
INR: 1.2 (ref 0.8–1.2)
Prothrombin Time: 15.5 seconds — ABNORMAL HIGH (ref 11.4–15.2)

## 2021-02-18 LAB — COOXEMETRY PANEL
Carboxyhemoglobin: 0.6 % (ref 0.5–1.5)
Methemoglobin: 0.8 % (ref 0.0–1.5)
O2 Saturation: 82.8 %
Total hemoglobin: 13.1 g/dL (ref 12.0–16.0)

## 2021-02-18 LAB — ECHOCARDIOGRAM LIMITED
Height: 68 in
Weight: 2528 oz

## 2021-02-18 LAB — MAGNESIUM
Magnesium: 2.4 mg/dL (ref 1.7–2.4)
Magnesium: 2.2 mg/dL (ref 1.7–2.4)

## 2021-02-18 LAB — PHOSPHORUS: Phosphorus: 3.7 mg/dL (ref 2.5–4.6)

## 2021-02-18 LAB — LACTIC ACID, PLASMA: Lactic Acid, Venous: 6 mmol/L (ref 0.5–1.9)

## 2021-02-18 MED ORDER — MIDAZOLAM HCL 2 MG/2ML IJ SOLN
INTRAMUSCULAR | Status: AC
  Filled 2021-02-18: qty 2

## 2021-02-18 MED ORDER — NOREPINEPHRINE 16 MG/250ML-% IV SOLN: 2.0000 ug/min | INTRAVENOUS | Status: DC

## 2021-02-18 MED ORDER — SODIUM CHLORIDE 0.9 % IV SOLN
250.0000 mL | INTRAVENOUS | Status: DC
  Administered 2021-02-18: 250 mL via INTRAVENOUS

## 2021-02-18 MED ORDER — FREE WATER: 150.0000 mL | Status: DC

## 2021-02-18 MED ORDER — DEXTROSE 50 % IV SOLN
INTRAVENOUS | Status: AC
  Administered 2021-02-18: 25 g via INTRAVENOUS
  Filled 2021-02-18: qty 50

## 2021-02-18 MED ORDER — ETOMIDATE 2 MG/ML IV SOLN
INTRAVENOUS | Status: AC
  Filled 2021-02-18: qty 20

## 2021-02-18 MED ORDER — ROCURONIUM BROMIDE 10 MG/ML (PF) SYRINGE
PREFILLED_SYRINGE | INTRAVENOUS | Status: AC
  Administered 2021-02-18: 100 mg via INTRAVENOUS
  Filled 2021-02-18: qty 10

## 2021-02-18 MED ORDER — FENTANYL CITRATE (PF) 100 MCG/2ML IJ SOLN
25.0000 ug | INTRAMUSCULAR | Status: DC | PRN
  Filled 2021-02-18: qty 2

## 2021-02-18 MED ORDER — HEPARIN SOD (PORK) LOCK FLUSH 10 UNIT/ML IV SOLN
10.0000 [IU] | Freq: Once | INTRAVENOUS | Status: DC
  Filled 2021-02-18: qty 1

## 2021-02-18 MED ORDER — NOREPINEPHRINE 4 MG/250ML-% IV SOLN
INTRAVENOUS | Status: AC
  Administered 2021-02-18: 20 ug/min via INTRAVENOUS
  Filled 2021-02-18: qty 250

## 2021-02-18 MED ORDER — ENSURE ENLIVE PO LIQD
237.0000 mL | Freq: Two times a day (BID) | ORAL | Status: DC
  Administered 2021-02-18: 237 mL via ORAL

## 2021-02-18 MED ORDER — HEPARIN SODIUM (PORCINE) 1000 UNIT/ML IJ SOLN
INTRAMUSCULAR | Status: AC
  Filled 2021-02-18: qty 5

## 2021-02-18 MED ORDER — DEXTROSE 50 % IV SOLN
50.0000 mL | Freq: Once | INTRAVENOUS | Status: AC
  Administered 2021-02-18: 50 mL via INTRAVENOUS
  Filled 2021-02-18: qty 50

## 2021-02-18 MED ORDER — FENTANYL CITRATE (PF) 100 MCG/2ML IJ SOLN
50.0000 ug | Freq: Once | INTRAMUSCULAR | Status: AC
Start: 2021-02-18 — End: 2021-02-18

## 2021-02-18 MED ORDER — GLYCOPYRROLATE 1 MG PO TABS
1.0000 mg | ORAL_TABLET | ORAL | Status: DC | PRN
  Filled 2021-02-18: qty 1

## 2021-02-18 MED ORDER — SODIUM CHLORIDE 0.9 % IV SOLN
1.0000 g | INTRAVENOUS | Status: DC
  Administered 2021-02-18: 1 g via INTRAVENOUS
  Filled 2021-02-18: qty 1

## 2021-02-18 MED ORDER — EPINEPHRINE 1 MG/10ML IJ SOSY
PREFILLED_SYRINGE | INTRAMUSCULAR | Status: AC
  Administered 2021-02-18: 0.5 mg
  Filled 2021-02-18: qty 20

## 2021-02-18 MED ORDER — ALBUMIN HUMAN 25 % IV SOLN
INTRAVENOUS | Status: AC
  Filled 2021-02-18: qty 50

## 2021-02-18 MED ORDER — LORAZEPAM 2 MG/ML IJ SOLN
1.0000 mg | INTRAMUSCULAR | Status: DC | PRN
  Administered 2021-02-18: 1 mg via INTRAVENOUS
  Filled 2021-02-18: qty 1

## 2021-02-18 MED ORDER — ETOMIDATE 2 MG/ML IV SOLN
INTRAVENOUS | Status: AC
  Administered 2021-02-18: 20 mg via INTRAVENOUS
  Filled 2021-02-18: qty 10

## 2021-02-18 MED ORDER — ROCURONIUM BROMIDE 50 MG/5ML IV SOLN: 100.0000 mg | Freq: Once | INTRAVENOUS | Status: AC

## 2021-02-18 MED ORDER — CEFAZOLIN SODIUM-DEXTROSE 2-4 GM/100ML-% IV SOLN
2.0000 g | INTRAVENOUS | Status: DC
  Filled 2021-02-18: qty 100

## 2021-02-18 MED ORDER — NOREPINEPHRINE 16 MG/250ML-% IV SOLN
0.0000 ug/min | INTRAVENOUS | Status: DC
  Administered 2021-02-18: 50 ug/min via INTRAVENOUS
  Administered 2021-02-18: 70 ug/min via INTRAVENOUS
  Filled 2021-02-18 (×2): qty 250

## 2021-02-18 MED ORDER — HYDROCORTISONE NA SUCCINATE PF 100 MG IJ SOLR: 100.0000 mg | Freq: Once | INTRAMUSCULAR | Status: DC

## 2021-02-18 MED ORDER — HEPARIN SODIUM (PORCINE) 1000 UNIT/ML DIALYSIS
5000.0000 [IU] | INTRAMUSCULAR | Status: DC | PRN
  Filled 2021-02-18: qty 5

## 2021-02-18 MED ORDER — NOREPINEPHRINE 4 MG/250ML-% IV SOLN: 2.0000 ug/min | INTRAVENOUS | Status: DC

## 2021-02-18 MED ORDER — SODIUM CHLORIDE 0.9 % IV SOLN: 100.0000 mL | INTRAVENOUS | Status: DC | PRN

## 2021-02-18 MED ORDER — PERFLUTREN LIPID MICROSPHERE
1.0000 mL | INTRAVENOUS | Status: DC | PRN
  Administered 2021-02-18: 2 mL via INTRAVENOUS
  Filled 2021-02-18: qty 10

## 2021-02-18 MED ORDER — ALBUMIN HUMAN 25 % IV SOLN
INTRAVENOUS | Status: AC
  Administered 2021-02-18: 12.5 g via INTRAVENOUS_CENTRAL
  Filled 2021-02-18: qty 50

## 2021-02-18 MED ORDER — LIDOCAINE-PRILOCAINE 2.5-2.5 % EX CREA
1.0000 "application " | TOPICAL_CREAM | CUTANEOUS | Status: DC | PRN
  Filled 2021-02-18: qty 5

## 2021-02-18 MED ORDER — POLYETHYLENE GLYCOL 3350 17 G PO PACK: 17.0000 g | PACK | Freq: Every day | ORAL | Status: DC

## 2021-02-18 MED ORDER — LACTATED RINGERS IV BOLUS
500.0000 mL | Freq: Once | INTRAVENOUS | Status: AC
  Administered 2021-02-18: 500 mL via INTRAVENOUS

## 2021-02-18 MED ORDER — VASOPRESSIN 20 UNITS/100 ML INFUSION FOR SHOCK
0.0400 [IU]/min | INTRAVENOUS | Status: DC
  Administered 2021-02-18: 0.04 [IU]/min via INTRAVENOUS
  Filled 2021-02-18: qty 100

## 2021-02-18 MED ORDER — FENTANYL CITRATE (PF) 100 MCG/2ML IJ SOLN
25.0000 ug | INTRAMUSCULAR | Status: DC | PRN
  Administered 2021-02-18: 100 ug via INTRAVENOUS

## 2021-02-18 MED ORDER — HYDROCORTISONE NA SUCCINATE PF 100 MG IJ SOLR
100.0000 mg | Freq: Two times a day (BID) | INTRAMUSCULAR | Status: DC
  Administered 2021-02-18: 100 mg via INTRAVENOUS
  Filled 2021-02-18: qty 2

## 2021-02-18 MED ORDER — LIDOCAINE HCL (PF) 1 % IJ SOLN: 5.0000 mL | INTRAMUSCULAR | Status: DC | PRN

## 2021-02-18 MED ORDER — HEPARIN SODIUM (PORCINE) 1000 UNIT/ML DIALYSIS
1000.0000 [IU] | INTRAMUSCULAR | Status: DC | PRN
  Filled 2021-02-18 (×2): qty 1

## 2021-02-18 MED ORDER — HEPARIN SODIUM (PORCINE) 1000 UNIT/ML DIALYSIS
1000.0000 [IU] | INTRAMUSCULAR | Status: DC | PRN
  Administered 2021-02-18: 2800 [IU] via INTRAVENOUS_CENTRAL
  Filled 2021-02-18 (×2): qty 6

## 2021-02-18 MED ORDER — FENTANYL CITRATE (PF) 100 MCG/2ML IJ SOLN
INTRAMUSCULAR | Status: AC
  Administered 2021-02-18: 50 ug via INTRAVENOUS
  Filled 2021-02-18: qty 2

## 2021-02-18 MED ORDER — GLYCOPYRROLATE 0.2 MG/ML IJ SOLN: 0.2000 mg | INTRAMUSCULAR | Status: DC | PRN

## 2021-02-18 MED ORDER — ONDANSETRON HCL 4 MG/2ML IJ SOLN: 4.0000 mg | Freq: Four times a day (QID) | INTRAMUSCULAR | Status: DC | PRN

## 2021-02-18 MED ORDER — OSMOLITE 1.5 CAL PO LIQD: 1000.0000 mL | ORAL | Status: DC

## 2021-02-18 MED ORDER — DEXTROSE 50 % IV SOLN: 25.0000 g | INTRAVENOUS | Status: AC

## 2021-02-18 MED ORDER — DOCUSATE SODIUM 50 MG/5ML PO LIQD: 100.0000 mg | Freq: Two times a day (BID) | ORAL | Status: DC

## 2021-02-18 MED ORDER — ETOMIDATE 2 MG/ML IV SOLN: 20.0000 mg | Freq: Once | INTRAVENOUS | Status: AC

## 2021-02-18 MED ORDER — SODIUM CHLORIDE 0.9 % IV SOLN
1.2500 ng/kg/min | INTRAVENOUS | Status: DC
  Administered 2021-02-18: 1.25 ng/kg/min via INTRAVENOUS
  Filled 2021-02-18: qty 1

## 2021-02-18 MED ORDER — EPINEPHRINE HCL 5 MG/250ML IV SOLN IN NS
0.5000 ug/min | INTRAVENOUS | Status: DC
Start: 2021-02-18 — End: 2021-02-18
  Administered 2021-02-18: 5 ug/min via INTRAVENOUS
  Administered 2021-02-18: 25 ug/min via INTRAVENOUS
  Filled 2021-02-18 (×2): qty 250

## 2021-02-18 MED ORDER — VANCOMYCIN HCL 750 MG/150ML IV SOLN
750.0000 mg | Freq: Once | INTRAVENOUS | Status: AC
  Administered 2021-02-18: 750 mg via INTRAVENOUS
  Filled 2021-02-18: qty 150

## 2021-02-18 MED ORDER — ONDANSETRON HCL 4 MG/2ML IJ SOLN
INTRAMUSCULAR | Status: AC
  Administered 2021-02-18: 4 mg via INTRAVENOUS
  Filled 2021-02-18: qty 2

## 2021-02-18 MED ORDER — PENTAFLUOROPROP-TETRAFLUOROETH EX AERO: 1.0000 "application " | INHALATION_SPRAY | CUTANEOUS | Status: DC | PRN

## 2021-02-18 MED ORDER — DEXMEDETOMIDINE HCL IN NACL 400 MCG/100ML IV SOLN
0.0000 ug/kg/h | INTRAVENOUS | Status: DC
  Administered 2021-02-18: 0.4 ug/kg/h via INTRAVENOUS
  Filled 2021-02-18: qty 100

## 2021-02-18 MED ORDER — KETAMINE HCL 50 MG/5ML IJ SOSY
PREFILLED_SYRINGE | INTRAMUSCULAR | Status: AC
  Filled 2021-02-18: qty 5

## 2021-02-18 MED ORDER — ALBUMIN HUMAN 25 % IV SOLN
25.0000 g | Freq: Four times a day (QID) | INTRAVENOUS | Status: DC
  Administered 2021-02-18: 25 g via INTRAVENOUS
  Filled 2021-02-18: qty 100

## 2021-02-18 MED ORDER — PROSOURCE TF PO LIQD: 45.0000 mL | Freq: Every day | ORAL | Status: DC

## 2021-02-18 MED ORDER — ALTEPLASE 2 MG IJ SOLR: 2.0000 mg | Freq: Once | INTRAMUSCULAR | Status: DC | PRN

## 2021-02-19 ENCOUNTER — Encounter: Payer: Medicare Other | Admitting: Physical Therapy

## 2021-02-19 LAB — CULTURE, BLOOD (ROUTINE X 2)

## 2021-02-19 LAB — GLUCOSE, CAPILLARY
Glucose-Capillary: 51 mg/dL — ABNORMAL LOW (ref 70–99)
Glucose-Capillary: 56 mg/dL — ABNORMAL LOW (ref 70–99)
Glucose-Capillary: 61 mg/dL — ABNORMAL LOW (ref 70–99)
Glucose-Capillary: 232 mg/dL — ABNORMAL HIGH (ref 70–99)

## 2021-02-21 ENCOUNTER — Encounter: Payer: Medicare Other | Admitting: Physical Therapy

## 2021-02-26 ENCOUNTER — Ambulatory Visit: Payer: Medicare Other | Admitting: Podiatry

## 2021-02-26 ENCOUNTER — Encounter: Payer: Medicare Other | Admitting: Physical Therapy

## 2021-02-27 ENCOUNTER — Telehealth: Payer: Self-pay | Admitting: *Deleted

## 2021-02-27 NOTE — Telephone Encounter (Signed)
Patients daughter calling to have bed removed from the home since patient has passed away. Is there a way for the team that installed the bed come back to get it?

## 2021-02-27 NOTE — Telephone Encounter (Signed)
Patient's daughter is wanting to return the bed that her dad had, did know were the order was sent,unable to locate.patient is deceased.

## 2021-02-28 ENCOUNTER — Encounter: Payer: Medicare Other | Admitting: Physical Therapy

## 2021-03-04 NOTE — Telephone Encounter (Signed)
Spoke to patient's daughter and expressed my condolences. She also said she already took care of the issue

## 2021-03-05 ENCOUNTER — Encounter: Payer: Medicare Other | Admitting: Physical Therapy

## 2021-03-07 ENCOUNTER — Encounter: Payer: Medicare Other | Admitting: Physical Therapy

## 2021-03-11 NOTE — Procedures (Signed)
Cortrak  Person Inserting Tube:  Meegan Shanafelt, RD Tube Type:  Cortrak - 43 inches Tube Size:  10 Tube Location:  Right nare Initial Placement:  Stomach Secured by: Bridle Technique Used to Measure Tube Placement:  Marking at nare/corner of mouth Cortrak Secured At:  66 cm  Cortrak Tube Team Note:  Consult received to place a Cortrak feeding tube.   X-ray is required, abdominal x-ray has been ordered by the Cortrak team. Please confirm tube placement before using the Cortrak tube.   If the tube becomes dislodged please keep the tube and contact the Cortrak team at [email or web address] (password [email or web address]) for replacement.  If after hours and replacement cannot be delayed, place a NG tube and confirm placement with an abdominal x-ray.    Treanna Dumler MS, RD, LDN, CNSC Clinical Nutrition Pager listed in AMION    

## 2021-03-11 NOTE — Progress Notes (Signed)
[facility] Progress Note  Subjective: on HD this am, confused  Vitals:   03/04/21 1000 2021/03/04 1015 04-Mar-2021 1030 Mar 04, 2021 1045  BP: (!) 148/115 (!) 76/40 (!) 79/44 (!) 92/38  Pulse:      Resp: 18 (!) 24 (!) 26 (!) 28  Temp:    98.1 F (36.7 C)  TempSrc:      SpO2:      Weight:      Height:        Exam:  alert, confused  no jvd  Chest cta bilat  Cor reg no RG  Abd soft ntnd no ascites   Ext no LE edema, L BKA   Alert, confused, moves all ext, lethargic   L AVG+bruit     OP HD: [facility]    MWF  3h 8min  400/1.5  71.5kg  2/2 bath LUE AVG Hep 5000+ [id number]   Assessment/ Plan ESRD - usual HD MWF. HD in progress this am on schedule.  Acute metabolic encephalopathy -Likely related to sepsis? No acute abnormalities on cth 7/10 Sepsis, possible biliary source -Antibiotics and infectious work-up per primary service AGMA/lactic acidosis - limited in regards to fluid options, if to further receive fluid resuscitation, please monitor volume status closely. Overall, seems euvolemic on exam, possibly dry Volume/ hypertension: under dry wt Anemia of Chronic Kidney Disease: Hemoglobin 12.2. not on esa's. Secondary Hyperparathyroidism/Hyperphosphatemia: hectorol 41mcg qtx, sensipar 30mg  post HD.     [name], MD 2021/03/04, 12:45 PM        Rob Nyomi Howser 03/04/2021, 12:44 PM   Recent Labs  Lab 02/10/2021 0755 02/13/2021 1023 03/01/2021 1452 04-Mar-2021 0500  K  --    < > 5.6* 4.5  BUN  --    < > 55* 68*  CREATININE 8.86*   < > 8.93* 9.54*  CALCIUM  --    < > 9.2 8.9  PHOS  --   --   --  3.7  HGB 12.2*  --   --  12.4*   < > = values in this interval not displayed.   Inpatient medications:  aspirin  81 mg Per Tube Daily   Chlorhexidine Gluconate Cloth  6 each Topical Q0600   clopidogrel  75 mg Per Tube Daily   feeding supplement  237 mL Oral BID BM   feeding supplement (PROSource TF)  45 mL Per Tube Daily   free water  150 mL Per Tube Q4H   heparin   5,000 Units Subcutaneous Q8H   heparin sodium (porcine)       insulin aspart  0-6 Units Subcutaneous Q4H   pantoprazole (PROTONIX) IV  40 mg Intravenous QHS    sodium chloride     sodium chloride     sodium chloride     albumin human      ceFAZolin (ANCEF) IV     feeding supplement (OSMOLITE 1.5 CAL)     norepinephrine (LEVOPHED) Adult infusion     sodium chloride, sodium chloride, acetaminophen, alteplase, docusate sodium, heparin, heparin, lidocaine (PF), lidocaine-prilocaine, ondansetron (ZOFRAN) IV, pentafluoroprop-tetrafluoroeth, polyethylene glycol

## 2021-03-11 NOTE — Progress Notes (Signed)
Initial Nutrition Assessment  DOCUMENTATION CODES:  Not applicable  INTERVENTION:  Initiate tube feeding via Cotrak: Osmolite 1.5 at 30 ml/h and increase by 10 ml every 6 hrs to goal rate of 55 ml/h (1320 ml per day) Prosource TF 45 ml ml daily  Provides 2020 kcal, 93 gm protein, 1003 ml free water daily.  Water flushes 150 ml every 4 hours.  Monitor magnesium, potassium, and phosphorus daily for at least 3 days, MD to replete as needed, as pt may be at risk for refeeding syndrome given unknown history of prior PO intake.  Add Ensure Enlive po BID, each supplement provides 350 kcal and 20 grams of protein.  NUTRITION DIAGNOSIS:  Increased nutrient needs related to acute illness (sepsis) as evidenced by estimated needs.  GOAL:  Patient will meet greater than or equal to 90% of their needs  MONITOR:   PO intake, Supplement acceptance, Diet advancement, Labs, Weight trends, TF tolerance, Skin, I & O's  REASON FOR ASSESSMENT:  New TF    ASSESSMENT:  75 yo male with a PMH of HTN, T2DM, ESRD on HD, L BKA, CHF, defibrillator in place, and PAD who presents with sepsis and lactic acidosis found to have MSSA bacteremia.  RD working remotely.  Pt with varying AMS this morning per RN via secure chat. Pt also has no meal documentation and has not had breakfast yet.   Pt also came in with AMS on admission and admitting MD unable to get any history.  Per Epic, pt has lost 13.6 lbs (8%) in the past 9 months, which is not necessarily significant for the time frame, but it is still concerning.  Given pt's unknown intake history, pt may be at refeeding risk. Monitor magnesium, potassium, and phosphorus daily for at least 3 days, MD to replete as needed, as pt is at risk for refeeding syndrome given this.   Recommend placement of Cortrak tube given pt's intermittent AMS and possibility that pt will be too confused to eat while admitted. Given that pt is currently on a soft diet, recommend  adding Ensure BID and having assistance with ordering meals.  Medications: reviewed; SSI, Protonix, Zofran PRN (given once today)  Labs: reviewed; Na 133 (L), CBG 73-120 HbA1c: 4.5% (02/2021)  NUTRITION - FOCUSED PHYSICAL EXAM: Unable to perform  Diet Order:   Diet Order             DIET SOFT Room service appropriate? Yes; Fluid consistency: Thin  Diet effective now                  EDUCATION NEEDS:  Education needs have been addressed  Skin:  Skin Assessment: Skin Integrity Issues: Skin Integrity Issues:: Other (Comment) Other: Amputation - L BKA  Last BM:  03/05/2021 - Type 6, large  Height:  Ht Readings from Last 1 Encounters:  02/19/2021 5\' 8"  (1.727 m)   Weight:  Wt Readings from Last 1 Encounters:  02/17/21 71.7 kg   BMI:  Body mass index is 24.02 kg/m.  Estimated Nutritional Needs:  Kcal:  1950-2150 Protein:  85-100 grams Fluid:  >1.95 L  Derrel Nip, RD, LDN (she/her/hers) Registered Dietitian I After-Hours/Weekend Pager # in [place]

## 2021-03-11 NOTE — Progress Notes (Signed)
  Echocardiogram 2D Echocardiogram has been performed.  Merrie Roof F 03-06-2021, 3:16 PM

## 2021-03-11 NOTE — Procedures (Signed)
Central Venous Catheter Insertion Procedure Note  Timothy Faulkner  [id number]  [birth year]  Date:March 17, 2021  Time:2:22 PM   Provider Performing:Vendela Troung R Rosalin Buster   Procedure: Insertion of Non-tunneled Central Venous Catheter(36556) with US guidance (07867)   Indication(s) Medication administration and Difficult access  Consent Unable to obtain consent due to emergent nature of procedure.  Anesthesia Topical only with 1% lidocaine   Timeout Verified patient identification, verified procedure, site/side was marked, verified correct patient position, special equipment/implants available, medications/allergies/relevant history reviewed, required imaging and test results available.  Sterile Technique Maximal sterile technique including full sterile barrier drape, hand hygiene, sterile gown, sterile gloves, mask, hair covering, sterile ultrasound probe cover (if used).  Procedure Description Area of catheter insertion was cleaned with chlorhexidine and draped in sterile fashion.  With real-time ultrasound guidance a central venous catheter was placed into the right femoral vein. Nonpulsatile blood flow and easy flushing noted in all ports.  The catheter was sutured in place and sterile dressing applied.  Complications/Tolerance None; patient tolerated the procedure well. Chest X-ray is ordered to verify placement for internal jugular or subclavian cannulation.   Chest x-ray is not ordered for femoral cannulation.  EBL Minimal  Specimen(s) None   Timothy Carpen Murrell Dome, PA-C

## 2021-03-11 NOTE — Progress Notes (Signed)
Per [employer] patient is ruled out for organ donation at this time.

## 2021-03-11 NOTE — Procedures (Signed)
Extubation Procedure Note  Patient Details:   Name: Timothy Faulkner DOB: 1946-04-26 MRN: [id number]   Airway Documentation:  Airway 8 mm (Active)  Secured at (cm) 23 cm 03-10-21 2040  Measured From Lips 03/10/21 2040  Superior 03-10-2021 2040  Secured By [employer] March 10, 2021 2040  Prone position No Mar 10, 2021 2040  Cuff Pressure (cm H2O) Green OR 18-26 CmH2O 03/10/21 2040  Site Condition Dry 2021-03-10 1300   Vent end date: 03/10/2021 Vent end time: 2045   Evaluation  O2 sats: currently acceptable Complications: No apparent complications Patient did tolerate procedure well. Bilateral Breath Sounds: Other (Comment) (course)   Patient extubated per MD order.   Catha Brow 2021-03-10, 8:46 PM

## 2021-03-11 NOTE — Progress Notes (Signed)
Chaplain at bedside at this time with family.

## 2021-03-11 NOTE — Progress Notes (Signed)
TOD- pronouced by Angus [place] Marylou Wages, RN and Erma Heritage, RN.  Family at bedside. Chaplain paged.   [phone]- Elink notified of TOD  NP Hayden Pedro notified of TOD

## 2021-03-11 NOTE — Procedures (Signed)
Central Venous Catheter Insertion Procedure Note  KERIC ZEHREN  [id number]  May 23, 1946  Date:2021/03/06  Time:2:23 PM   Provider Performing:Veryl Winemiller R Justice Milliron   Procedure: Insertion of Non-tunneled Central Venous Catheter(36556)with US guidance (99774)    Indication(s) Hemodialysis  Consent Unable to obtain consent due to emergent nature of procedure.  Anesthesia Topical only with 1% lidocaine   Timeout Verified patient identification, verified procedure, site/side was marked, verified correct patient position, special equipment/implants available, medications/allergies/relevant history reviewed, required imaging and test results available.  Sterile Technique Maximal sterile technique including full sterile barrier drape, hand hygiene, sterile gown, sterile gloves, mask, hair covering, sterile ultrasound probe cover (if used).  Procedure Description Area of catheter insertion was cleaned with chlorhexidine and draped in sterile fashion.   With real-time ultrasound guidance a HD catheter was placed into the left femoral vein.  Nonpulsatile blood flow and easy flushing noted in all ports.  The catheter was sutured in place and sterile dressing applied.  Complications/Tolerance None; patient tolerated the procedure well. Chest X-ray is ordered to verify placement for internal jugular or subclavian cannulation.  Chest x-ray is not ordered for femoral cannulation.  EBL Minimal  Specimen(s) None    Otilio Carpen Garlin Batdorf, PA-C

## 2021-03-11 NOTE — Progress Notes (Signed)
Pt's son, daughter, wife and other family at bedside at this time.

## 2021-03-11 NOTE — Progress Notes (Signed)
Pharmacy Antibiotic Note  Timothy Faulkner is a 75 y.o. male with MSSA bacteremia on ancef.  He is now on the vent and with worsening blood pressure on levophed and epinephrine.  Pharmacy has been consulted for vancomycin and cefepime dosing. He is noted with ESRD on HD with last HD 7/11 (3hrs at 350-400 BFR) -vancomycin 1500mg  IV given 7/10 at 4:30am -cefepime 2gm given 7/10 ~ 7am  Plan: -Cefepime 1gm IV q24h -vancomycin 750 mg IV x1 -Will follow plans for HD vs CRRT   Height: 5\' 8"  (172.7 cm) Weight: 71.7 kg (158 lb) (from Mar 2022 records) IBW/kg (Calculated) : 68.4  Temp (24hrs), Avg:97.9 F (36.6 C), Min:97.7 F (36.5 C), Max:98.1 F (36.7 C)  Recent Labs  Lab 02/10/2021 0117 03/07/2021 0317 02/11/2021 0755 02/15/2021 1023 02/21/2021 1452 2021/03/09 0500  WBC 12.3*  --  12.4*  --   --  10.3  CREATININE 8.85*  --  8.86* 8.99* 8.93* 9.54*  LATICACIDVEN 8.6* 6.6*  --  6.0* 4.3*  --      Estimated Creatinine Clearance: 6.6 mL/min (A) (by C-G formula based on SCr of 9.54 mg/dL (H)).    No Known Allergies  Thank you for allowing pharmacy to be a part of this patient's care.  Hildred Laser, PharmD Clinical Pharmacist **Pharmacist phone directory can now be found on [email or web address] (PW [email or web address]).  Listed under [place].

## 2021-03-11 NOTE — Consult Note (Addendum)
Advanced Heart Failure Team Consult Note   Primary Physician: [name], MD EP: Dr. Lovena Le  PCP-Cardiologist:  [name] AHF: New   Reason for Consultation: Acute on Chronic Systolic Heart Failure   HPI:    Timothy Faulkner is seen today for evaluation of acute on chronic systolic heart failure at the request of Dr. Tamala Julian, PCCM.   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 Followed by [name]. Dr. Lovena Le follows ICD. Last echo prior to this admit 04/2019 showed LVEF 25-30% w/ normal RV.   Presented to [facility] on 7/10 w/ AMS and found to be septic, Febrile w/ temp 103F, WBC 12.3, lactic acid 8.6. Head CT negative for acute process. CXR w/o infiltrate. Admitted to ICU, blood cultures obtained and started on Vanc + Zozyn. Blood cx + for MSSA. ID consulted and abx narrowed to ceftriaxone. ? Biliary source. Abdominal US + for mild CBD dilatation without calculi or other obstructing etiology identified. Bilirubin elevated at 1.6.   Echo on admit showed LVEF 30-35% w/ normal RV. No visibile vegetations.   Today,  he developed worsening AMS, worsening shock w/ hypotension and acute respiratory failure requiring intubation. Now on NE 50 + Epi 15. Limited echo repeated showing worsening HF w/ biventricular dysfunction.  LVEF now < 20%, RV moderately reduced. STAT Co-ox normal at 83%. Repeat Lactic acid pending. Hs trop 114>>1,204. MAP 58 on A-line.      Cardiac Studies  Echo 04/2019: LVEF 25-30%, RV normal.   2D Echo 02/11/2021 1. Left ventricular ejection fraction, by estimation, is 30 to 35%. The left ventricle has severely decreased function. The left ventricle demonstrates global hypokinesis. Left ventricular diastolic parameters are indeterminate. 2. Right ventricular systolic function is normal. The right ventricular size is normal. 3. The mitral valve is normal in  structure. Mild mitral valve regurgitation. 4. The aortic valve is calcified. Aortic valve regurgitation is not visualized. Mild aortic valve sclerosis is present, with no evidence of aortic valve stenosis. 5. The inferior vena cava is normal in size with <50% respiratory variability, suggesting right atrial pressure of 8 mmHg.   Limited Echo 03/13/21 1. Left ventricular ejection fraction, by estimation, is <20%. The left ventricle has severely decreased function. The left ventricle demonstrates global hypokinesis. 2. Right ventricular systolic function is moderately reduced. The right ventricular size is normal. 3. Findings consistent with biventricular failure (L>R) that appears worse than prior TTE on 03/01/2021. Comparison(s): Compared to prior TTE on 02/25/2021, the LVEF is now severely reduced <20% (likely closer to 10-15%) with moderate RV dysfunction as wel  Review of Systems: [y] = yes, [ ]  = no   General: Weight gain [ ] ; Weight loss [ ] ; Anorexia [ ] ; Fatigue [ Y]; Fever [ Y]; Chills [ ] ; Weakness [ Y]  Cardiac: Chest pain/pressure [ ] ; Resting SOB [ ] ; Exertional SOB [ ] ; Orthopnea [ ] ; Pedal Edema [ ] ; Palpitations [ ] ; Syncope [ ] ; Presyncope [ ] ; Paroxysmal nocturnal dyspnea[ ]   Pulmonary: Cough [ ] ; Wheezing[ ] ; Hemoptysis[ ] ; Sputum [ ] ; Snoring [ ]   GI: Vomiting[ ] ; Dysphagia[ ] ; Melena[ ] ; Hematochezia [ ] ; Heartburn[ ] ; Abdominal pain [ ] ; Constipation [ ] ; Diarrhea [ ] ; BRBPR [ ]   GU: Hematuria[ ] ; Dysuria [ ] ; Nocturia[ ]   Vascular: Pain in legs with walking [ ] ; Pain in feet  with lying flat [ ] ; Non-healing sores [ ] ; Stroke [ ] ; TIA [ ] ; Slurred speech [ ] ;  Neuro: Headaches[ ] ; Vertigo[ ] ; Seizures[ ] ; Paresthesias[ ] ;Blurred vision [ ] ; Diplopia [ ] ; Vision changes [ ]  AMS [Y] Ortho/Skin: Arthritis [ ] ; Joint pain [ ] ; Muscle pain [ ] ; Joint swelling [ ] ; Back Pain [ ] ; Rash [ ]   Psych: Depression[ ] ; Anxiety[ ]   Heme: Bleeding problems [ ] ; Clotting disorders [ ] ;  Anemia [ ]   Endocrine: Diabetes [ y]; Thyroid dysfunction[ ]   Home Medications Prior to Admission medications   Medication Sig Start Date End Date Taking? Authorizing Provider  acetaminophen (TYLENOL) 500 MG tablet Take 500 mg by mouth every 6 (six) hours as needed for mild pain or headache.    Yes [provider]  aspirin 81 MG EC tablet Take 81 mg by mouth daily. 03/17/13  Yes [provider]  calcitRIOL (ROCALTROL) 0.25 MCG capsule Take 0.25 mcg by mouth every Monday, Wednesday, and Friday.   Yes [provider]  clopidogrel (PLAVIX) 75 MG tablet Take 1 tablet (75 mg total) by mouth daily with breakfast. 03/30/19  Yes Angiulli, Lavon Paganini, PA-C  lidocaine-prilocaine (EMLA) cream Apply 1 application topically. Prior to dialysis Monday,Wednesday and friday 05/11/20  Yes [provider]  RENVELA 800 MG tablet Take 1,600 mg by mouth 3 (three) times daily with meals. 02/15/20  Yes [provider]  vitamin B-12 (CYANOCOBALAMIN) 1000 MCG tablet Take 1,000 mcg by mouth every Monday.   Yes [provider]  Amino Acids-Protein Hydrolys (FEEDING SUPPLEMENT, PRO-STAT SUGAR FREE 64,) LIQD Take 30 mLs by mouth 2 (two) times daily. Patient not taking: Reported on 02/12/2021 03/03/19   Modena Jansky, MD  AURYXIA 1 GM 210 MG(Fe) tablet Take 210 mg by mouth 3 (three) times daily.  Patient not taking: Reported on 02/12/2021 06/08/19   [provider]  bisacodyl (DULCOLAX) 5 MG EC tablet Take 1 tablet (5 mg total) by mouth daily as needed for moderate constipation. Patient not taking: Reported on 03/07/2021 04/17/19   Guilford Shi, MD  Calcium Carbonate Antacid (CALCIUM CARBONATE, DOSED IN MG ELEMENTAL CALCIUM,) 1250 MG/5ML SUSP Take 5 mLs (500 mg of elemental calcium total) by mouth every 6 (six) hours as needed for indigestion. Patient not taking: Reported on 02/21/2021 03/30/19   Angiulli, Lavon Paganini, PA-C  cinacalcet (SENSIPAR) 30 MG tablet Take 1 tablet  (30 mg total) by mouth every other day. Patient not taking: Reported on 03/04/2021 03/30/19   Angiulli, Lavon Paganini, PA-C  collagenase (SANTYL) ointment Apply topically daily. Wound measures 0.5x0.5. Apply to wound - edge to edge nickel thick.  Cover with wet gauze followed by dry gauze and change daily Patient not taking: Reported on 02/19/2021 05/15/20   [name], DPM  docusate sodium (COLACE) 100 MG capsule Take 2 capsules (200 mg total) by mouth 2 (two) times daily. Patient not taking: Reported on 02/13/2021 03/30/19   Byers, Lavon Paganini, PA-C  Doxercalciferol (HECTOROL IV) Dialysis days Monday, Wednesday and friday 10/01/20 12/04/21  [provider]  heparin 1000 unit/mL SOLN injection Dialysis Monday,Wednesday,friday 04/13/20 04/12/21  [provider]  HYDROcodone-acetaminophen (NORCO) 5-325 MG tablet Take 1 tablet by mouth every 6 (six) hours as needed for moderate pain. Patient not taking: Reported on 02/17/2021 08/09/19   Dagoberto Ligas, PA-C  methocarbamol (ROBAXIN) 500 MG tablet Take 1 tablet (500 mg total) by mouth every 12 (twelve) hours as needed for muscle spasms. Patient not  taking: Reported on 02/09/2021 03/30/19   Angiulli, Lavon Paganini, PA-C  midodrine (PROAMATINE) 10 MG tablet Take 1 tablet (10 mg total) by mouth every Monday, Wednesday, and Friday with hemodialysis. Patient not taking: Reported on 02/08/2021 03/30/19   Angiulli, Lavon Paganini, PA-C  rosuvastatin (CRESTOR) 10 MG tablet Take 1 tablet (10 mg total) by mouth daily at 6 PM. Patient not taking: Reported on 02/15/2021 03/30/19   Angiulli, Lavon Paganini, PA-C  silver sulfADIAZINE (SILVADENE) 1 % cream Apply pea-sized amount to wound daily. Patient not taking: Reported on 03/10/2021 05/12/19   [name], DPM    Past Medical History: Past Medical History:  Diagnosis Date   Anemia    Arthritis    Gout   Automatic implantable cardioverter-defibrillator in situ    [employer]   CHF    ESRD on dialysis [facility])     [name] 03/11/2013 (03/11/2013) dialysis M/W/F   Gangrene (HCC)    left foot   GERD (gastroesophageal reflux disease)    Gout    "once a year"   Heart murmur    HYPERCHOLESTEROLEMIA, MIXED    HYPERTENSION    Macular degeneration    Osteomyelitis ([place])    right foot   Other primary cardiomyopathies 07/16/2011   Pacemaker    Peripheral arterial disease (HCC)    left fifth toe ulcer, healing   Pneumonia    Shortness of breath    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)    left fifth toe   Wears dentures    Wears glasses     Past Surgical History: Past Surgical History:  Procedure Laterality Date   A/V FISTULAGRAM Left 07/20/2018   Procedure: A/V FISTULAGRAM;  Surgeon: [name], MD;  Location: [place] [facility];  Service: Cardiovascular;  Laterality: Left;   A/V FISTULAGRAM N/A 07/15/2019   Procedure: A/V FISTULAGRAM - Left Arm;  Surgeon: Elam Dutch, MD;  Location: [place] [facility];  Service: Cardiovascular;  Laterality: N/A;   ABDOMINAL AORTOGRAM N/A 02/25/2019   Procedure: ABDOMINAL AORTOGRAM;  Surgeon: Elam Dutch, MD;  Location: [place] [facility];  Service: Cardiovascular;  Laterality: N/A;   ABDOMINAL AORTOGRAM W/LOWER EXTREMITY Bilateral 07/20/2018   Procedure: ABDOMINAL AORTOGRAM W/LOWER EXTREMITY;  Surgeon: [name], MD;  Location: [place] [facility];  Service: Cardiovascular;  Laterality: Bilateral;   AMPUTATION Right 09/07/2018   Procedure: Right fifth metatarsectomy;  Surgeon: [name], DPM;  Location: [place];  Service: Podiatry;  Laterality: Right;   AMPUTATION Left 03/08/2019   Procedure: AMPUTATION  3RD AND 4TH TOES LEFT FOOT;  Surgeon: [name], DPM;  Location: [place];  Service: Podiatry;  Laterality: Left;   AMPUTATION Left 03/15/2019   Procedure: AMPUTATION BELOW KNEE;  Surgeon: Elam Dutch, MD;  Location: [facility] OR;  Service: Vascular;  Laterality: Left;   AV FISTULA PLACEMENT Right 12/13/2012    Procedure: ARTERIOVENOUS (AV) FISTULA CREATION;  Surgeon: [name], MD;  Location: [place];  Service: Vascular;  Laterality: Right;  Ultrasound guided   AV FISTULA PLACEMENT Left 05/07/2016   Procedure: LEFT RADIOCEPHALIC ARTERIOVENOUS (AV) FISTULA CREATION;  Surgeon: [name], MD;  Location: [facility] OR;  Service: Vascular;  Laterality: Left;   AV FISTULA PLACEMENT Right 08/09/2019   Procedure: INSERTION OF RIGHT ARTERIOVENOUS (AV) 4-64mm GORE-TEX STRETCH GRAFT RIGHT  ARM;  Surgeon: Elam Dutch, MD;  Location: [place];  Service: Vascular;  Laterality: Right;   BASCILIC  VEIN TRANSPOSITION Right 03/26/2016   Procedure: RIGHT BASILIC VEIN TRANSPOSITION;  Surgeon: [name], MD;  Location: [place];  Service: Vascular;  Laterality: Right;   CARDIAC CATHETERIZATION     CARDIAC DEFIBRILLATOR PLACEMENT     [employer]   CATARACT EXTRACTION W/PHACO Bilateral    EYE SURGERY Bilateral    Cataract   FISTULOGRAM Left 04/22/2018   Procedure: FISTULOGRAM UPPER EXTREMITY;  Surgeon: [name], MD;  Location: [place];  Service: Vascular;  Laterality: Left;   I & D EXTREMITY Right 07/15/2018   Procedure: IRRIGATION AND DEBRIDEMENT RIGHT FOOT;  Surgeon: [name], DPM;  Location: [place];  Service: Podiatry;  Laterality: Right;   INCISION AND DRAINAGE ABSCESS / HEMATOMA OF BURSA / KNEE / THIGH Left 2012   "knee" (03/11/2013)   INSERT / REPLACE / REMOVE PACEMAKER     INSERTION OF DIALYSIS CATHETER Left 04/22/2018   Procedure: INSERTION OF DIALYSIS CATHETER;  Surgeon: [name], MD;  Location: [place];  Service: Vascular;  Laterality: Left;   IR FLUORO GUIDE CV LINE LEFT  07/15/2018   IR PTA VENOUS EXCEPT DIALYSIS CIRCUIT  07/15/2018   LOWER EXTREMITY ANGIOGRAPHY Right 07/21/2018   Procedure: LOWER EXTREMITY ANGIOGRAPHY;  Surgeon: [name], MD;  Location: [place] [facility];  Service: Cardiovascular;  Laterality: Right;   LOWER EXTREMITY ANGIOGRAPHY Bilateral 02/25/2019    Procedure: Lower Extremity Angiography;  Surgeon: Elam Dutch, MD;  Location: [place] [facility];  Service: Cardiovascular;  Laterality: Bilateral;   METATARSAL HEAD EXCISION Right 07/15/2018   Procedure: METATARSAL RESECTION;  Surgeon: [name], DPM;  Location: [place];  Service: Podiatry;  Laterality: Right;   MULTIPLE TOOTH EXTRACTIONS     PERIPHERAL VASCULAR ATHERECTOMY Right 02/28/2019   Procedure: PERIPHERAL VASCULAR ATHERECTOMY;  Surgeon: Waynetta Sandy, MD;  Location: [place] [facility];  Service: Cardiovascular;  Laterality: Right;  right tp trunk    PERIPHERAL VASCULAR BALLOON ANGIOPLASTY Left 02/25/2019   Procedure: PERIPHERAL VASCULAR BALLOON ANGIOPLASTY;  Surgeon: Elam Dutch, MD;  Location: [employer] [facility];  Service: Cardiovascular;  Laterality: Left;  tibial peroneal trunk and PT   PERIPHERAL VASCULAR INTERVENTION Right 07/21/2018   Procedure: PERIPHERAL VASCULAR INTERVENTION;  Surgeon: [name], MD;  Location: [place] [facility];  Service: Cardiovascular;  Laterality: Right;  peroneal stents   REVISON OF ARTERIOVENOUS FISTULA Right 6/73/4193   Procedure: Plication OF Right Arm ARTERIOVENOUS FISTULA;  Surgeon: Elam Dutch, MD;  Location: [place];  Service: Vascular;  Laterality: Right;   REVISON OF ARTERIOVENOUS FISTULA Left 04/22/2018   Procedure: REVISION OF RADIOCEPHALIC ARTERIOVENOUS FISTULA;  Surgeon: [name], MD;  Location: [place];  Service: Vascular;  Laterality: Left;   SHUNTOGRAM N/A 05/31/2013   Procedure: [place];  Surgeon: [name], MD;  Location: [facility] CATH LAB;  Service: Cardiovascular;  Laterality: N/A;   UPPER EXTREMITY VENOGRAPHY Right 07/23/2018   Procedure: UPPER EXTREMITY VENOGRAPHY;  Surgeon: [name], MD;  Location: [place] [facility];  Service: Cardiovascular;  Laterality: Right;   UPPER EXTREMITY VENOGRAPHY  07/15/2019   Procedure: UPPER EXTREMITY VENOGRAPHY;  Surgeon: Elam Dutch, MD;  Location: [place] [facility];  Service: Cardiovascular;;  rt arm    WOUND DEBRIDEMENT Right 07/17/2018   Procedure: Wound Debridement; Closure Filleted toe flap Right Foot;  Surgeon: [name], DPM;  Location: [place];  Service: Podiatry;  Laterality: Right;   WOUND DEBRIDEMENT Right  09/07/2018   Procedure: Debridement Right Foot Wound, application of wound vac;  Surgeon: [name], DPM;  Location: [place];  Service: Podiatry;  Laterality: Right;   WOUND DEBRIDEMENT Right 03/08/2019   Procedure: DEBRIDEMENT WOUND RIGHT FOOT;  Surgeon: [name], DPM;  Location: [place];  Service: Podiatry;  Laterality: Right;    Family History: Family History  Problem Relation Age of Onset   Heart disease Father    CAD Father     Social History: Social History   Socioeconomic History   Marital status: Married    Spouse name: [name]   Number of children: 2   Years of education: 13   Highest education level: Not on file  Occupational History   Occupation: diasbled  Tobacco Use   Smoking status: Former    Packs/day: 0.75    Years: 30.00    Pack years: 22.50    Types: Cigarettes    Quit date: 11/25/1992    Years since quitting: 28.2   Smokeless tobacco: Never  Vaping Use   Vaping Use: Never used  Substance and Sexual Activity   Alcohol use: Not Currently   Drug use: Not Currently    Types: Marijuana    Comment: last use 10 years ago   Sexual activity: Yes  Other Topics Concern   Not on file  Social History Narrative   Pt lives in single story home with his wife and daughter   Has 2 children   Some college education   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[employer]"      Social Determinants of Health   Financial Resource Strain: Not on file  Food Insecurity: Not on file  Transportation Needs: Not on file  Physical Activity: Not on file  Stress: Not on file  Social Connections: Not on file    Allergies:  No Known Allergies  Objective:    Vital Signs:   Temp:   [97.7 F (36.5 C)-98.1 F (36.7 C)] 98.1 F (36.7 C) (07/11 1045) Pulse Rate:  [43-123] 123 (07/11 1300) Resp:  [9-32] 24 (07/11 1300) BP: (76-148)/(38-117) 102/55 (07/11 1300) SpO2:  [89 %-100 %] 90 % (07/11 1300) FiO2 (%):  [100 %] 100 % (07/11 1300) Last BM Date: 02/19/2021  Weight change: Filed Weights   02/22/2021 0331  Weight: 71.7 kg    Intake/Output:   Intake/Output Summary (Last 24 hours) at 2021-03-08 1635 Last data filed at 08-Mar-2021 1045 Gross per 24 hour  Intake 200.07 ml  Output -515 ml  Net 715.07 ml      Physical Exam    General:  chronically ill appearing. Intubated and sedated  HEENT: normal + ETT  Neck: supple. JVP not well visualized . Carotids 2+ bilat; no bruits. No lymphadenopathy or thyromegaly appreciated. Cor: PMI nondisplaced. Regular rhythm, tachy rate. No rubs, gallops or murmurs. Lungs: clear Abdomen: soft, nontender, nondistended. No hepatosplenomegaly. No bruits or masses. Good bowel sounds. Extremities: no cyanosis, clubbing, rash, edema, s/p left BKA + RUE fistula + Rt femoral HD cath + Left femoral CVC  Neuro: intubated and sedated   Telemetry   Sinus tach 120s   EKG    Sinus tach w/ PVCs 112 bpm   Labs   Basic Metabolic Panel: Recent Labs  Lab 02/14/2021 0117 02/27/2021 0239 03/02/2021 0755 02/23/2021 1023 02/09/2021 1452 2021-03-08 0500 2021-03-08 0836 2021-03-08 1318  NA 136   < >  --  135 134* 133* 134* 139  K 4.5   < >  --  5.7*  5.6* 4.5 3.8 3.8  CL 94*  --   --  94* 97* 94* 96*  --   CO2 21*  --   --  25 20* 24 22  --   GLUCOSE 96  --   --  80 84 100* 206*  --   BUN 40*  --   --  49* 55* 68* 26*  --   CREATININE 8.85*  --  8.86* 8.99* 8.93* 9.54* 5.33*  --   CALCIUM 10.0  --   --  9.4 9.2 8.9 8.9  --   MG  --   --   --   --   --  2.4 2.2  --   PHOS  --   --   --   --   --  3.7 3.4  --    < > = values in this interval not displayed.    Liver Function Tests: Recent Labs  Lab 02/09/2021 0117 2021-03-11 0836  AST 35  --   ALT  13  --   ALKPHOS 57  --   BILITOT 1.6*  --   PROT 7.6  --   ALBUMIN 3.5 3.3*   No results for input(s): LIPASE, AMYLASE in the last 168 hours. Recent Labs  Lab 02/20/2021 0125  AMMONIA 20    CBC: Recent Labs  Lab 02/16/2021 0117 02/19/2021 0239 02/16/2021 0755 03-11-2021 0500 03-11-21 1009 03/11/2021 1318  WBC 12.3*  --  12.4* 10.3 14.4*  --   NEUTROABS 11.5*  --   --   --   --   --   HGB 13.3 13.9 12.2* 12.4* 13.1 13.9  HCT 43.2 41.0 39.1 38.0* 39.3 41.0  MCV 106.9*  --  107.4* 102.2* 100.3*  --   PLT 104*  --  83* 72* 88*  --     Cardiac Enzymes: No results for input(s): CKTOTAL, CKMB, CKMBINDEX, TROPONINI in the last 168 hours.  BNP: BNP (last 3 results) No results for input(s): BNP in the last 8760 hours.  ProBNP (last 3 results) No results for input(s): PROBNP in the last 8760 hours.   CBG: Recent Labs  Lab 03/08/2021 1752 02/11/2021 2047 03-11-2021 0007 03/11/21 0807 03/11/2021 1207  GLUCAP 84 120* 73 88 51*    Coagulation Studies: Recent Labs    03/01/2021 0117 03-11-2021 0500  LABPROT 16.8* 15.5*  INR 1.4* 1.2     Imaging   DG Chest 1 View  Result Date: Mar 11, 2021 CLINICAL DATA:  Intubation, septic shock EXAM: CHEST  1 VIEW COMPARISON:  03/05/2021 FINDINGS: Interval placement of ET tube with distal tip terminating approximately 3.9 cm above the carina. Interval placement of an enteric tube extending below the diaphragm with distal tip beyond the inferior margin of the film. Left-sided implanted cardiac device. Overlying cardiac leads. Stable heart size. Interval development of patchy right upper lobe airspace opacity. Left lung remains clear. No pleural effusion or pneumothorax. Right axillary vascular stent. IMPRESSION: 1. Interval placement of ET tube and enteric tube as described. 2. Interval development of patchy right upper lobe airspace opacity. Electronically Signed   By: Davina Poke D.O.   On: 03/11/21 15:28   DG Abd Portable 1V  Result Date:  2021/03/11 CLINICAL DATA:  Feeding tube placement. EXAM: PORTABLE ABDOMEN - 1 VIEW COMPARISON:  None. FINDINGS: Feeding tube terminates in the region of the pylorus. Bowel gas pattern is unremarkable. IMPRESSION: Feeding tube tip is in the pyloric region. Electronically Signed   By: Lorin Picket  M.D.   On: 21-Feb-2021 12:35   ECHOCARDIOGRAM LIMITED  Result Date: 02/21/21    ECHOCARDIOGRAM LIMITED REPORT   Patient Name:   Timothy Faulkner Date of Exam: 2021/02/21 Medical Rec #:  [id number]       Height:       68.0 in Accession #:    [id number]      Weight:       158.0 lb Date of Birth:  May 15, 1946       BSA:          1.849 m Patient Age:    36 years        BP:           80/47 mmHg Patient Gender: M               HR:           104 bpm. Exam Location:  Inpatient Procedure: Limited Echo, 2D Echo and Intracardiac Opacification Agent STAT ECHO Indications:    Cardiac arrest  History:        Patient has prior history of Echocardiogram examinations, most                 recent 02/10/2021. Signs/Symptoms:Hypotension. ESRD on HD.                 Respiratory failure on mechanical ventilation.  Sonographer:    Merrie Roof RDCS Referring Phys: [id number] [place]  1. Left ventricular ejection fraction, by estimation, is <20%. The left ventricle has severely decreased function. The left ventricle demonstrates global hypokinesis.  2. Right ventricular systolic function is moderately reduced. The right ventricular size is normal.  3. Findings consistent with biventricular failure (L>R) that appears worse than prior TTE on 03/07/2021. Comparison(s): Compared to prior TTE on 03/07/2021, the LVEF is now severely reduced <20% (likely closer to 10-15%) with moderate RV dysfunction as well. Findings discussed with Dr. Tamala Julian. FINDINGS  Left Ventricle: Left ventricular ejection fraction, by estimation, is <20%. The left ventricle has severely decreased function. The left ventricle demonstrates global hypokinesis. Definity  contrast agent was given IV to delineate the left ventricular endocardial borders. The left ventricular internal cavity size was normal in size. There is no left ventricular hypertrophy. Right Ventricle: The right ventricular size is normal. Right ventricular systolic function is moderately reduced. Pericardium: There is no evidence of pericardial effusion. Mitral Valve: There is mild thickening of the mitral valve leaflet(s). There is mild calcification of the mitral valve leaflet(s). Moderate mitral annular calcification. Venous: IVC assessment for right atrial pressure unable to be performed due to mechanical ventilation. Additional Comments: A device lead is visualized. IVC IVC diam: 2.10 cm Gwyndolyn Kaufman MD Electronically signed by Gwyndolyn Kaufman MD Signature Date/Time: 2021-02-21/4:15:19 PM    Final      Medications:     Current Medications:  aspirin  81 mg Per Tube Daily   Chlorhexidine Gluconate Cloth  6 each Topical Q0600   clopidogrel  75 mg Per Tube Daily   docusate  100 mg Per Tube BID   EPINEPHrine       etomidate       feeding supplement  237 mL Oral BID BM   feeding supplement (PROSource TF)  45 mL Per Tube Daily   free water  150 mL Per Tube Q4H   heparin flush  10 Units Intracatheter Once   heparin flush  10 Units Intracatheter Once   heparin  5,000 Units Subcutaneous Q8H  heparin sodium (porcine)       hydrocortisone sod succinate (SOLU-CORTEF) inj  100 mg Intravenous Q12H   insulin aspart  0-6 Units Subcutaneous Q4H   ketamine HCl       midazolam       pantoprazole (PROTONIX) IV  40 mg Intravenous QHS   polyethylene glycol  17 g Per Tube Daily    Infusions:  sodium chloride     sodium chloride     sodium chloride 250 mL (Feb 28, 2021 1310)   albumin human     ceFEPime (MAXIPIME) IV 1 g (2021-02-28 1612)   dexmedetomidine (PRECEDEX) IV infusion 0.4 mcg/kg/hr (2021-02-28 1559)   epinephrine 15 mcg/min (02-28-21 1612)   feeding supplement (OSMOLITE 1.5 CAL)      norepinephrine (LEVOPHED) Adult infusion 50 mcg/min (February 28, 2021 1427)   vancomycin        Patient Profile   75 y/o male w/ chronic systolic heart failure 2/2 presumed NICM (nonischemic NST in 2011 and 2014), ESRD on HD (MWF), PVD s/p left BKA, HTN and H/o VF w/ ICD. Here w/ septic shock in the setting of MSSA Bacteremia. Echo w/ worsening HF w/ biventricular dysfunction. LVEF < 20%, RV moderately reduced (new).    Assessment/Plan   Acute on Chronic Systolic  Heart Failure w/ Biventricular Dysfunction  - Chronic systolic HF 2/2 presumed NICM (nonischemic NST in 2011 and 2014) - Echo 04/2019 LVEF 25-30% w/ normal RV - Echo 02/28/2021 EF 30-35%, RV normal  - Repeat limited Echo 02-28-2021 EF < 20%, RV moderately reduced, diffuse HK - Suspect acute interval change in LV function w/ new RV dysfunction is stress induced in setting of septic shock/ active infection  - Co-ox ok at 83% - Volume management through HD   2. Sepsis Shock 2/2 MSSA Bacteremia  - Suspect Biliary source, Abd [place] w/ CBD dilatation. Total Bili elevated at 1.6 - ID following, abx narrowed to cefepime  - TTE w/ no evidence of vegetation but will need definitive TEE  - c/w NE + Epi to maintain MAP > 65  - Co-ox ok at 83%. Primarily distributive shock - D/w PCCM. Plan to add Giapreza   3. ICD - h/o VF, followed by Dr. Lovena Le - MSSA Bacteremia - Needs TEE - EP consult ? Device Extraction   4. ESRD - HD MWF  - HD per nephrology, likely will need transition to CRRT given hypotension requiring pressor support   5. PVD - s/p remote left BKA    Length of Stay: 1  Brittainy Simmons, PA-C  Feb 28, 2021, 4:35 PM  Advanced Heart Failure Team Pager [phone] (M-F; 7a - 5p)  Please contact [facility] for night-coverage after hours (4p -7a ) and weekends on [email or web address]  Agree with above.   75 y/o male with ESRD, chronic systolic HF s/p ICD due to presumed NICM (no h/o cath), PAD s/p previous L BKA, DM2.  Baseline relatively  functional going to PT for new BKA.   Admitted with AMS. Found to have septic shock with MSS bacteremia. Initial echo yesterday with EF 3-35% but echo repeated today and EF 20% with sever RV dysfunction. Hstrop 114 -> 1200. ECG chronic inferolateral Qs.   Lactic acid 8.6 -> 6.0 -> 4.3  Now intubated on epi/NE. Co-ox 83%  General:  Ill appearing. Sedated on vent HEENT: normal + ETT Neck: supple.hard to see. Carotids 2+ bilat; no bruits. No lymphadenopathy or thryomegaly appreciated. Cor: PMI nondisplaced. Regular rate & rhythm. No rubs, gallops or murmurs. Lungs: clear  Abdomen: soft, nontender, nondistended. No hepatosplenomegaly. No bruits or masses. Good bowel sounds. Extremities: cachetic no cyanosis, clubbing, rash, edema L BKA LFV HD cath RFV TLC  RUE AVF Neuro: intubated/sedated  Based on co-ox, suspect shock is primarily septic in nature, however co-ox likely confounded by AV fistula (presence of peripheral shunt.   Profound drop in EF may be related to septic CM but based on his comorbidities and elevated hstrop suspect he likely has severe underlying CAD  For now, agree with support with NE/Epi/VP to get MAPs > 65 if possible.   If/when recovers will need cath with [name]. Will also need TEE and EP to see for possible ICD extraction.   CRITICAL CARE Performed by: Glori Bickers  Total critical care time: 60 minutes  Critical care time was exclusive of separately billable procedures and treating other patients.  Critical care was necessary to treat or prevent imminent or life-threatening deterioration.  Critical care was time spent personally by me (independent of midlevel providers or residents) on the following activities: development of treatment plan with patient and/or surrogate as well as nursing, discussions with consultants, evaluation of patient's response to treatment, examination of patient, obtaining history from patient or surrogate, ordering and  performing treatments and interventions, ordering and review of laboratory studies, ordering and review of radiographic studies, pulse oximetry and re-evaluation of patient's condition.  Glori Bickers, MD  6:13 PM

## 2021-03-11 NOTE — Death Summary Note (Signed)
DEATH SUMMARY   Patient Details  Name: Timothy Faulkner MRN: [id number] DOB: 24-Jun-1946  Admission/Discharge Information   Admit Date:  03/05/2021  Date of Death: Date of Death: 03-06-2021  Time of Death: Time of Death: 11/16/28  Length of Stay: 1  Referring Physician: [name], MD    Diagnoses  Profound septic shock secondary to MSSA bacteremia Multiorgan failure due to septic shock ESRD on HD DM2 CHF s/p defibrillator GERD L BKA HTN  Brief Hospital Course (including significant findings, care, treatment, and services provided and events leading to death)  [place] Timothy Faulkner is 75 y.o. M who presented to the [facility] ED on 03-06-2023 with AMS   He has a pertient past medical history of ESRD (MWF dialysis), CHF, Defibrillator in place, DM2, HTN, HLD, PAD, GERD, L BKA.   Per chart review, Mr. Waterhouse lives at home with his daughter and he is able to hold normal conversations at baseline. His last dialysis was on Friday 7/8. He reportedly does not make urine. On 7/8-7/9 he became increasingly altered and he remained in bed all day on 7/9. Family expressed that he occasionally becomes confused once a month. Family denies that the patient had any fever, cough, vomiting, or diarrhea. EMS was called on 2023-03-06 and he was brought to the ED.   ED workup was notable for a fever of 103F, WBC 12.3, initial lactate of 8.6, which decreased to 6.6 after 500cc of IVF. Head CT was negative for acute process. CXR did not demonstrate any clear infiltrate or effusion. No ST changes seen on 12 lead. Ammonia 20. LFT WNL. Creatinine 8.85 (baseline 4-5). He was given a dose of Vancomycin and Zosyn.   PCCM was consulted for admission to the ICU.  Patient initially responded to fluids but later on hospital day 1 went into irreversible shock.  Blood cultures grew MSSA. In light of inevitable demise, family elevated to allow him to pass in peace.    Pertinent Labs and Studies  Significant Diagnostic Studies DG Chest 1  View  Result Date: 06-Mar-2021 CLINICAL DATA:  Intubation, septic shock EXAM: CHEST  1 VIEW COMPARISON:  03-05-21 FINDINGS: Interval placement of ET tube with distal tip terminating approximately 3.9 cm above the carina. Interval placement of an enteric tube extending below the diaphragm with distal tip beyond the inferior margin of the film. Left-sided implanted cardiac device. Overlying cardiac leads. Stable heart size. Interval development of patchy right upper lobe airspace opacity. Left lung remains clear. No pleural effusion or pneumothorax. Right axillary vascular stent. IMPRESSION: 1. Interval placement of ET tube and enteric tube as described. 2. Interval development of patchy right upper lobe airspace opacity. Electronically Signed   By: Davina Poke D.O.   On: 2021-03-06 15:28   DG Chest 1 View  Result Date: 03-05-2021 CLINICAL DATA:  Chest pain EXAM: CHEST  1 VIEW COMPARISON:  04/14/2019 FINDINGS: Cardiac shadow is stable. Defibrillator is again noted and stable. Dialysis catheter has been removed in the interval. Lungs are well aerated bilaterally. Mild central vascular congestion is seen without edema. No focal infiltrate is seen. Stenting in the right are is again identified and stable. IMPRESSION: Mild vascular congestion without interstitial edema. No other focal abnormality is noted. Electronically Signed   By: Inez Catalina M.D.   On: 03/05/2021 01:58   CT Head Wo Contrast  Result Date: 03/05/2021 CLINICAL DATA:  Altered mental status EXAM: CT HEAD WITHOUT CONTRAST TECHNIQUE: Contiguous axial images were obtained from the base of  the skull through the vertex without intravenous contrast. COMPARISON:  04/14/2019 FINDINGS: Brain: Somewhat limited due to patient motion artifact. Chronic atrophic and ischemic changes are noted. No findings to suggest acute hemorrhage, acute infarction or space-occupying mass lesion are noted. Vascular: No hyperdense vessel or unexpected calcification.  Skull: Normal. Negative for fracture or focal lesion. Sinuses/Orbits: No acute finding. Other: None. IMPRESSION: Chronic atrophic and ischemic changes without acute abnormality. Electronically Signed   By: Inez Catalina M.D.   On: 02/20/2021 03:22   DG Abd Portable 1V  Result Date: 02/23/2021 CLINICAL DATA:  Feeding tube placement. EXAM: PORTABLE ABDOMEN - 1 VIEW COMPARISON:  None. FINDINGS: Feeding tube terminates in the region of the pylorus. Bowel gas pattern is unremarkable. IMPRESSION: Feeding tube tip is in the pyloric region. Electronically Signed   By: Lorin Picket M.D.   On: 02-23-2021 12:35   ECHOCARDIOGRAM COMPLETE  Result Date: 03/09/2021    ECHOCARDIOGRAM REPORT   Patient Name:   Timothy Faulkner Date of Exam: 03/06/2021 Medical Rec #:  [id number]       Height:       68.0 in Accession #:    [id number]      Weight:       158.0 lb Date of Birth:  [birth year]       BSA:          1.849 m Patient Age:    65 years        BP:           91/77 mmHg Patient Gender: M               HR:           50 bpm. Exam Location:  Inpatient Procedure: 2D Echo, Cardiac Doppler, Color Doppler and Intracardiac            Opacification Agent Indications:     Fever  History:         Patient has prior history of Echocardiogram examinations, most                  recent 04/17/2019. CHF; Pacemaker. ESRD. GERD.  Sonographer:     Clayton Lefort RDCS (AE) Referring Phys:  [id number] Estill Cotta Diagnosing Phys: [name] MD IMPRESSIONS  1. Left ventricular ejection fraction, by estimation, is 30 to 35%. The left ventricle has severely decreased function. The left ventricle demonstrates global hypokinesis. Left ventricular diastolic parameters are indeterminate.  2. Right ventricular systolic function is normal. The right ventricular size is normal.  3. The mitral valve is normal in structure. Mild mitral valve regurgitation.  4. The aortic valve is calcified. Aortic valve regurgitation is not visualized. Mild aortic valve  sclerosis is present, with no evidence of aortic valve stenosis.  5. The inferior vena cava is normal in size with <50% respiratory variability, suggesting right atrial pressure of 8 mmHg. FINDINGS  Left Ventricle: Left ventricular ejection fraction, by estimation, is 30 to 35%. The left ventricle has severely decreased function. The left ventricle demonstrates global hypokinesis. Definity contrast agent was given IV to delineate the left ventricular endocardial borders. The left ventricular internal cavity size was normal in size. There is no left ventricular hypertrophy. Left ventricular diastolic parameters are indeterminate. Right Ventricle: The right ventricular size is normal. No increase in right ventricular wall thickness. Right ventricular systolic function is normal. Left Atrium: Left atrial size was normal in size. Right Atrium: Right atrial size was normal in size. Pericardium: There  is no evidence of pericardial effusion. Mitral Valve: The mitral valve is normal in structure. Mild mitral valve regurgitation. MV peak gradient, 4.0 mmHg. The mean mitral valve gradient is 1.0 mmHg. Tricuspid Valve: The tricuspid valve is normal in structure. Tricuspid valve regurgitation is mild. Aortic Valve: The aortic valve is calcified. Aortic valve regurgitation is not visualized. Mild aortic valve sclerosis is present, with no evidence of aortic valve stenosis. Aortic valve mean gradient measures 5.0 mmHg. Aortic valve peak gradient measures 9.6 mmHg. Aortic valve area, by VTI measures 1.11 cm. Pulmonic Valve: The pulmonic valve was normal in structure. Pulmonic valve regurgitation is not visualized. Aorta: The aortic root is normal in size and structure. Venous: The inferior vena cava is normal in size with less than 50% respiratory variability, suggesting right atrial pressure of 8 mmHg. IAS/Shunts: No atrial level shunt detected by color flow Doppler. Additional Comments: A device lead is visualized.  LEFT  VENTRICLE PLAX 2D LVIDd:         4.50 cm  Diastology LVIDs:         3.90 cm  LV e' medial:    5.06 cm/s LV PW:         1.40 cm  LV E/e' medial:  22.1 LV IVS:        1.10 cm  LV e' lateral:   8.93 cm/s LVOT diam:     1.80 cm  LV E/e' lateral: 12.5 LV SV:         40 LV SV Index:   22 LVOT Area:     2.54 cm  RIGHT VENTRICLE             IVC RV Basal diam:  2.50 cm     IVC diam: 1.70 cm RV S prime:     10.10 cm/s TAPSE (M-mode): 1.3 cm LEFT ATRIUM           Index       RIGHT ATRIUM           Index LA diam:      3.80 cm 2.06 cm/m  RA Area:     10.80 cm LA Vol (A2C): 37.6 ml 20.34 ml/m RA Volume:   22.70 ml  12.28 ml/m LA Vol (A4C): 70.5 ml 38.13 ml/m  AORTIC VALVE AV Area (Vmax):    1.36 cm AV Area (Vmean):   1.14 cm AV Area (VTI):     1.11 cm AV Vmax:           155.00 cm/s AV Vmean:          99.600 cm/s AV VTI:            0.363 m AV Peak Grad:      9.6 mmHg AV Mean Grad:      5.0 mmHg LVOT Vmax:         82.80 cm/s LVOT Vmean:        44.500 cm/s LVOT VTI:          0.159 m LVOT/AV VTI ratio: 0.44  AORTA Ao Root diam: 3.20 cm MITRAL VALVE                TRICUSPID VALVE MV Area (PHT): 2.95 cm     TR Peak grad:   22.7 mmHg MV Area VTI:   0.89 cm     TR Vmax:        238.00 cm/s MV Peak grad:  4.0 mmHg MV Mean grad:  1.0 mmHg     SHUNTS  MV Vmax:       1.00 m/s     Systemic VTI:  0.16 m MV Vmean:      55.7 cm/s    Systemic Diam: 1.80 cm MV Decel Time: 257 msec MV E velocity: 112.00 cm/s MV A velocity: 108.00 cm/s MV E/A ratio:  1.04 [name] MD Electronically signed by [name] MD Signature Date/Time: 03/01/2021/12:57:42 PM    Final    ECHOCARDIOGRAM LIMITED  Result Date: 03-20-2021    ECHOCARDIOGRAM LIMITED REPORT   Patient Name:   DALAN COWGER Date of Exam: March 20, 2021 Medical Rec #:  [id number]       Height:       68.0 in Accession #:    [id number]      Weight:       158.0 lb Date of Birth:  Jul 25, 1946       BSA:          1.849 m Patient Age:    9 years        BP:           80/47 mmHg Patient Gender:  M               HR:           104 bpm. Exam Location:  Inpatient Procedure: Limited Echo, 2D Echo and Intracardiac Opacification Agent STAT ECHO Indications:    Cardiac arrest  History:        Patient has prior history of Echocardiogram examinations, most                 recent 02/16/2021. Signs/Symptoms:Hypotension. ESRD on HD.                 Respiratory failure on mechanical ventilation.  Sonographer:    Merrie Roof RDCS Referring Phys: [id number] [place]  1. Left ventricular ejection fraction, by estimation, is <20%. The left ventricle has severely decreased function. The left ventricle demonstrates global hypokinesis.  2. Right ventricular systolic function is moderately reduced. The right ventricular size is normal.  3. 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 Comparison(s): Compared to prior TTE on 02/12/2021, the LVEF is now severely reduced <20% (likely closer to 10-15%) with moderate RV dysfunction as well. Findings discussed with Dr. Tamala Julian. FINDINGS  Left Ventricle: Left ventricular ejection fraction, by estimation, is <20%. The left ventricle has severely decreased function. The left ventricle demonstrates global hypokinesis. Definity contrast agent was given IV to delineate the left ventricular endocardial borders. The left ventricular internal cavity size was normal in size. There is no left ventricular hypertrophy. Right Ventricle: The right ventricular size is normal. Right ventricular systolic function is moderately reduced. Pericardium: There is no evidence of pericardial effusion. Mitral Valve: There is mild thickening of the mitral valve leaflet(s). There is mild calcification of the mitral valve leaflet(s). Moderate mitral annular calcification. Venous: IVC assessment for right atrial pressure unable to be performed due to mechanical ventilation. Additional Comments: A device lead is visualized. IVC IVC diam: 2.10 cm Gwyndolyn Kaufman MD Electronically signed by Gwyndolyn Kaufman MD Signature Date/Time: 20-Mar-2021/4:15:19 PM    Final    US Abdomen Limited RUQ (LIVER/GB)  Result Date: 02/12/2021 CLINICAL DATA:  Sepsis, hyperbilirubinemia EXAM: ULTRASOUND ABDOMEN LIMITED RIGHT UPPER QUADRANT COMPARISON:  None. FINDINGS: Gallbladder: No gallstones or wall thickening visualized. No sonographic Murphy sign noted by sonographer. Common bile duct: Diameter: 8 mm Liver: Limited visualization. No focal lesion  identified. Within normal limits in parenchymal echogenicity. Portal vein is patent on color Doppler imaging with normal direction of blood flow towards the liver. Other: None. IMPRESSION: Mild common bile duct dilatation without calculi or other obstructing etiology identified. Consider MRCP or nuclear scintigraphic HIDA scan to further assess patency of the common bile duct if obstruction is suspected. Electronically Signed   By: Eddie Candle M.D.   On: 03/07/2021 10:18    Microbiology Recent Results (from the past 240 hour(s))  Culture, blood (Routine x 2)     Status: Abnormal   Collection Time: 02/11/2021  1:48 AM   Specimen: BLOOD LEFT FOREARM  Result Value Ref Range Status   Specimen Description BLOOD LEFT FOREARM  Final   Special Requests   Final    BOTTLES DRAWN AEROBIC AND ANAEROBIC Blood Culture results may not be optimal due to an inadequate volume of blood received in culture bottles   Culture  Setup Time   Final    GRAM POSITIVE COCCI IN CLUSTERS IN BOTH AEROBIC AND ANAEROBIC BOTTLES CRITICAL RESULT CALLED TO, READ BACK BY AND VERIFIED WITH: PHARM D J.MILLEN ON [id number] AT [id number] BY E.PARRISH Performed at [facility], [place] [address], [place], [place] [address]    Culture STAPHYLOCOCCUS AUREUS (A)  Final   Report Status 02/19/2021 FINAL  Final   Organism ID, Bacteria STAPHYLOCOCCUS AUREUS  Final      Susceptibility   Staphylococcus aureus - MIC*    CIPROFLOXACIN >=8 RESISTANT Resistant      ERYTHROMYCIN >=8 RESISTANT Resistant     GENTAMICIN <=0.5 SENSITIVE Sensitive     OXACILLIN 0.5 SENSITIVE Sensitive     TETRACYCLINE <=1 SENSITIVE Sensitive     VANCOMYCIN <=0.5 SENSITIVE Sensitive     TRIMETH/SULFA <=10 SENSITIVE Sensitive     CLINDAMYCIN <=0.25 SENSITIVE Sensitive     RIFAMPIN <=0.5 SENSITIVE Sensitive     Inducible Clindamycin NEGATIVE Sensitive     * STAPHYLOCOCCUS AUREUS  Blood Culture ID Panel (Reflexed)     Status: Abnormal   Collection Time: 03/08/2021  1:48 AM  Result Value Ref Range Status   Enterococcus faecalis NOT DETECTED NOT DETECTED Final   Enterococcus Faecium NOT DETECTED NOT DETECTED Final   Listeria monocytogenes NOT DETECTED NOT DETECTED Final   Staphylococcus species DETECTED (A) NOT DETECTED Final    Comment: CRITICAL RESULT CALLED TO, READ BACK BY AND VERIFIED WITH: PHARM D J.MILLEN ON [id number] AT 1438 BY E.PARRISH    Staphylococcus aureus (BCID) DETECTED (A) NOT DETECTED Final    Comment: CRITICAL RESULT CALLED TO, READ BACK BY AND VERIFIED WITH: PHARM D J.Williamson ON [id number] AT 3474 BY E.PARRISH    Staphylococcus epidermidis NOT DETECTED NOT DETECTED Final   Staphylococcus lugdunensis NOT DETECTED NOT DETECTED Final   Streptococcus species NOT DETECTED NOT DETECTED Final   Streptococcus agalactiae NOT DETECTED NOT DETECTED Final   Streptococcus pneumoniae NOT DETECTED NOT DETECTED Final   Streptococcus pyogenes NOT DETECTED NOT DETECTED Final   A.calcoaceticus-baumannii NOT DETECTED NOT DETECTED Final   Bacteroides fragilis NOT DETECTED NOT DETECTED Final   Enterobacterales NOT DETECTED NOT DETECTED Final   Enterobacter cloacae complex NOT DETECTED NOT DETECTED Final   Escherichia coli NOT DETECTED NOT DETECTED Final   Klebsiella aerogenes NOT DETECTED NOT DETECTED Final   Klebsiella oxytoca NOT DETECTED NOT DETECTED Final   Klebsiella pneumoniae NOT DETECTED NOT DETECTED Final   Proteus species NOT DETECTED NOT DETECTED Final    Salmonella species NOT DETECTED NOT  DETECTED Final   Serratia marcescens NOT DETECTED NOT DETECTED Final   Haemophilus influenzae NOT DETECTED NOT DETECTED Final   Neisseria meningitidis NOT DETECTED NOT DETECTED Final   Pseudomonas aeruginosa NOT DETECTED NOT DETECTED Final   Stenotrophomonas maltophilia NOT DETECTED NOT DETECTED Final   Candida albicans NOT DETECTED NOT DETECTED Final   Candida auris NOT DETECTED NOT DETECTED Final   Candida glabrata NOT DETECTED NOT DETECTED Final   Candida krusei NOT DETECTED NOT DETECTED Final   Candida parapsilosis NOT DETECTED NOT DETECTED Final   Candida tropicalis NOT DETECTED NOT DETECTED Final   Cryptococcus neoformans/gattii NOT DETECTED NOT DETECTED Final   Meth resistant mecA/C and MREJ NOT DETECTED NOT DETECTED Final    Comment: Performed at [facility], [place] [address], [place], [place] [address]  Culture, blood (Routine x 2)     Status: Abnormal   Collection Time: 02/19/2021  2:10 AM   Specimen: BLOOD LEFT HAND  Result Value Ref Range Status   Specimen Description BLOOD LEFT HAND  Final   Special Requests   Final    BOTTLES DRAWN AEROBIC AND ANAEROBIC Blood Culture results may not be optimal due to an inadequate volume of blood received in culture bottles   Culture  Setup Time   Final    GRAM POSITIVE COCCI IN CLUSTERS IN BOTH AEROBIC AND ANAEROBIC BOTTLES CRITICAL VALUE NOTED.  VALUE IS CONSISTENT WITH PREVIOUSLY REPORTED AND CALLED VALUE.    Culture (A)  Final    STAPHYLOCOCCUS AUREUS SUSCEPTIBILITIES PERFORMED ON PREVIOUS CULTURE WITHIN THE LAST 5 DAYS. Performed at [facility], [place] [address] [place], [place] [address]    Report Status 02/19/2021 FINAL  Final  Resp Panel by RT-PCR (Flu A&B, Covid) Nasopharyngeal Swab     Status: None   Collection Time: 03/03/2021  3:25 AM   Specimen: Nasopharyngeal Swab; Nasopharyngeal(NP) swabs in vial transport medium  Result Value Ref Range Status   SARS Coronavirus 2 by RT PCR  NEGATIVE NEGATIVE Final    Comment: (NOTE) SARS-CoV-2 target nucleic acids are NOT DETECTED.  The SARS-CoV-2 RNA is generally detectable in upper respiratory specimens during the acute phase of infection. The lowest concentration of SARS-CoV-2 viral copies this assay can detect is 138 copies/mL. A negative result does not preclude SARS-Cov-2 infection and should not be used as the sole basis for treatment or other patient management decisions. A negative result may occur with  improper specimen collection/handling, submission of specimen other than nasopharyngeal swab, presence of viral mutation(s) within the areas targeted by this assay, and inadequate number of viral copies(<138 copies/mL). A negative result must be combined with clinical observations, patient history, and epidemiological information. The expected result is Negative.  Fact Sheet for Patients:  EntrepreneurPulse.com.au  Fact Sheet for Healthcare Providers:  IncredibleEmployment.be  This test is no t yet approved or cleared by the [place] FDA and  has been authorized for detection and/or diagnosis of SARS-CoV-2 by FDA under an Emergency Use Authorization (EUA). This EUA will remain  in effect (meaning this test can be used) for the duration of the COVID-19 declaration under Section 564(b)(1) of the Act, 21 U.S.C.section 360bbb-3(b)(1), unless the authorization is terminated  or revoked sooner.       Influenza A by PCR NEGATIVE NEGATIVE Final   Influenza B by PCR NEGATIVE NEGATIVE Final    Comment: (NOTE) The Xpert Xpress SARS-CoV-2/FLU/RSV plus assay is intended as an aid in the diagnosis of influenza from Nasopharyngeal swab  specimens and should not be used as a sole basis for treatment. Nasal washings and aspirates are unacceptable for Xpert Xpress SARS-CoV-2/FLU/RSV testing.  Fact Sheet for Patients: EntrepreneurPulse.com.au  Fact Sheet for  Healthcare Providers: IncredibleEmployment.be  This test is not yet approved or cleared by the [place] FDA and has been authorized for detection and/or diagnosis of SARS-CoV-2 by FDA under an Emergency Use Authorization (EUA). This EUA will remain in effect (meaning this test can be used) for the duration of the COVID-19 declaration under Section 564(b)(1) of the Act, 21 U.S.C. section 360bbb-3(b)(1), unless the authorization is terminated or revoked.  Performed at [facility], [place] [address], [place], [place] [address]     Lab Basic Metabolic Panel: Recent Labs  Lab 02/09/2021 0117 03/06/2021 0239 03/04/2021 0755 03/07/2021 1023 02/08/2021 1452 03-16-2021 0500 2021/03/16 0836 03/16/21 1318 03-16-2021 1635  NA 136   < >  --  135 134* 133* 134* 139 136  K 4.5   < >  --  5.7* 5.6* 4.5 3.8 3.8 3.7  CL 94*  --   --  94* 97* 94* 96*  --   --   CO2 21*  --   --  25 20* 24 22  --   --   GLUCOSE 96  --   --  80 84 100* 206*  --   --   BUN 40*  --   --  49* 55* 68* 26*  --   --   CREATININE 8.85*  --  8.86* 8.99* 8.93* 9.54* 5.33*  --   --   CALCIUM 10.0  --   --  9.4 9.2 8.9 8.9  --   --   MG  --   --   --   --   --  2.4 2.2  --   --   PHOS  --   --   --   --   --  3.7 3.4  --   --    < > = values in this interval not displayed.   Liver Function Tests: Recent Labs  Lab 02/12/2021 0117 March 16, 2021 0836 Mar 16, 2021 1431  AST 35  --  71*  ALT 13  --  23  ALKPHOS 57  --  57  BILITOT 1.6*  --  1.6*  PROT 7.6  --  7.2  ALBUMIN 3.5 3.3* 3.2*   No results for input(s): LIPASE, AMYLASE in the last 168 hours. Recent Labs  Lab 02/16/2021 0125  AMMONIA 20   CBC: Recent Labs  Lab 02/24/2021 0117 02/20/2021 0239 02/21/2021 0755 03/16/21 0500 2021/03/16 1009 03/16/2021 1318 2021/03/16 1635  WBC 12.3*  --  12.4* 10.3 14.4*  --   --   NEUTROABS 11.5*  --   --   --   --   --   --   HGB 13.3   < > 12.2* 12.4* 13.1 13.9 14.3  HCT 43.2   < > 39.1 38.0* 39.3 41.0 42.0  MCV 106.9*   --  107.4* 102.2* 100.3*  --   --   PLT 104*  --  83* 72* 88*  --   --    < > = values in this interval not displayed.   Cardiac Enzymes: No results for input(s): CKTOTAL, CKMB, CKMBINDEX, TROPONINI in the last 168 hours. Sepsis Labs: Recent Labs  Lab 02/16/2021 0117 02/16/2021 0317 02/21/2021 0755 02/25/2021 1023 02/13/2021 1452 03-16-21 0500 2021-03-16 1009 2021-03-16 1621  WBC 12.3*  --  12.4*  --   --  10.3 14.4*  --   LATICACIDVEN 8.6* 6.6*  --  6.0* 4.3*  --   --  6.0*   Candee Furbish 02/20/2021, 4:28 PM

## 2021-03-11 NOTE — Progress Notes (Signed)
[place]ox would argue this is more septic shock driving septic cardiomyopathy.  Appreciate CHF team taking a look to see if anything more can be offered.  Erskine Emery MD PCCM

## 2021-03-11 NOTE — Progress Notes (Addendum)
Family at bedside for final viewing.   Preferred funeral home is [facility]. [facility] in [place], [place]. See Post mortem charting for contact information.   2215- Family left at this time with remaining personal belongings. Folder with patient hand prints given to family.     1mg  Ativan wasted in steri cycle with Luna Glasgow, RN

## 2021-03-11 NOTE — Consult Note (Signed)
Responded to page that pt just went on comfort care, several family members would appreciate chaplain visit. They did, especially the prayer together and prayer shawl to dover pt. Family is [place]. I let them know they can ask nurse to call a chaplain at any time.   Rev. Eloise Levels Chaplain

## 2021-03-11 NOTE — Procedures (Signed)
Intubation Procedure Note  Timothy Faulkner  [id number]  07/25/1946  Date:2021/02/24  Time:2:18 PM   Provider Performing:Vernon Jon R [name]    Procedure: Intubation (31500)  Indication(s) Respiratory Failure  Consent Unable to obtain consent due to emergent nature of procedure.   Anesthesia Etomidate, Fentanyl, and Rocuronium   Time Out Verified patient identification, verified procedure, site/side was marked, verified correct patient position, special equipment/implants available, medications/allergies/relevant history reviewed, required imaging and test results available.   Sterile Technique Usual hand hygeine, masks, and gloves were used   Procedure Description Patient positioned in bed supine.  Sedation given as noted above.  Patient was intubated with endotracheal tube using Glidescope.  View was Grade 1 full glottis .  Number of attempts was 1.  Colorimetric CO2 detector was consistent with tracheal placement.   Complications/Tolerance None; patient tolerated the procedure well. Chest X-ray is ordered to verify placement.   EBL Minimal   Specimen(s) None   Otilio Carpen [name], PA-C

## 2021-03-11 NOTE — Progress Notes (Signed)
Pushing epi intermittently to maintain perfusing MAPs (pre and post intubation) ABG okay, H/H on ABG okay Giving some fluid. Given degree of illness I am going to broaden abx until we have full maturity Repeat blood cultures Bedside echo shows severely reduced EF, will get formal limited to confirm Check EKG, stress steroids, trops, lactate, CBC

## 2021-03-11 NOTE — Significant Event (Signed)
Patient with progressive hypotension despite Vasopressin, EPI, Levophed, and Giapreza. Son, Daughter, Daughter-in-law called to waiting room. Updated on condition. Understand severity. Agree to DNR. Wish to discuss possible comfort measures.   Shortly after family approached and voiced wishes to transition to comfort care. Chaplain called to bedside. Bedside RN updated on plan of care.

## 2021-03-11 NOTE — Procedures (Signed)
Arterial Catheter Insertion Procedure Note  HEITH HAIGLER  [id number]  February 19, 1946  Date:03-05-2021  Time:2:18 PM    Provider Performing: Otilio Carpen Iverson Sees    Procedure: Insertion of Arterial Line [phone]) with US guidance ([id number])   Indication(s) Blood pressure monitoring and/or need for frequent ABGs  Consent Unable to obtain consent due to emergent nature of procedure.  Anesthesia None   Time Out Verified patient identification, verified procedure, site/side was marked, verified correct patient position, special equipment/implants available, medications/allergies/relevant history reviewed, required imaging and test results available.   Sterile Technique Maximal sterile technique including full sterile barrier drape, hand hygiene, sterile gown, sterile gloves, mask, hair covering, sterile ultrasound probe cover (if used).   Procedure Description Area of catheter insertion was cleaned with chlorhexidine and draped in sterile fashion. With real-time ultrasound guidance an arterial catheter was placed into the right radial artery.  Appropriate arterial tracings confirmed on monitor.     Complications/Tolerance None; patient tolerated the procedure well.   EBL Minimal   Specimen(s) None   Mickel Baas R Admiral Marcucci, PA-C \

## 2021-03-11 NOTE — Progress Notes (Signed)
NAME:  Timothy Faulkner, MRN:  [id number], DOB:  Feb 17, 1946, LOS: 1 ADMISSION DATE:  02/13/2021, CONSULTATION DATE:  03/10/2021 REFERRING MD:  Almyra Free MD, CHIEF COMPLAINT: AMS    History of Present Illness:  Patient is encephalopathic. Therefore history has been obtained from chart review. No family present at bedside.    Timothy Faulkner is 75 y.o. M who presented to the [facility] ED on 7/10 with AMS  He has a pertient past medical history of ESRD (MWF dialysis), CHF, Defibrillator in place, DM2, HTN, HLD, PAD, GERD, L BKA.  Per chart review, Timothy Faulkner lives at home with his daughter and he is able to hold normal conversations at baseline. His last dialysis was on Friday 7/8. He reportedly does not make urine. On 7/8-7/9 he became increasingly altered and he remained in bed all day on 7/9. Family expressed that he occasionally becomes confused once a month. Family denies that the patient had any fever, cough, vomiting, or diarrhea. EMS was called on 7/10 and he was brought to the ED.  ED workup was notable for a fever of 103F, WBC 12.3, initial lactate of 8.6, which decreased to 6.6 after 500cc of IVF. Head CT was negative for acute process. CXR did not demonstrate any clear infiltrate or effusion. No ST changes seen on 12 lead. Ammonia 20. LFT WNL. Creatinine 8.85 (baseline 4-5). He was given a dose of Vancomycin and Zosyn.   PCCM was consulted for admission to the ICU.  Pertinent  Medical History  ESRD (MWF dialysis), CHF, Defibrillator in place, DM2, HTN, HLD, PAD, GERD, L BKA,   Significant Hospital Events: Including procedures, antibiotic start and stop dates in addition to other pertinent events   7/10 Admit. MSSA bacteremia.  Interim History / Subjective:  No events, somnolent with shaking movements and variable Bps.  ABG pending. RR tenuous.  Getting HD.  Objective   Blood pressure (!) 92/38, pulse (!) 50, temperature 98.1 F (36.7 C), resp. rate (!) 28, height 5\' 8"  (1.727 m), weight  71.7 kg, SpO2 (!) 89 %.        Intake/Output Summary (Last 24 hours) at 03/03/2021 1212 Last data filed at Mar 03, 2021 1045 Gross per 24 hour  Intake 200.07 ml  Output -515 ml  Net 715.07 ml   Filed Weights   02/15/2021 0331  Weight: 71.7 kg    Examination: Constitutional: frail man somnolent Eyes: tracking, pupils equal Ears, nose, mouth, and throat: MM dry, trachea midline Cardiovascular: bradycardic, has weak peripheral pulses Respiratory: snoring respirations, RR in 20s Gastrointestinal: soft, hypoactive BS Skin: No rashes, normal turgor Neurologic: able to move all 4 ext with enough prompting, shaking of ext intermittent ?CO2 retention Psychiatric: AO x 3 but quickly falls back asleep, CBG was 50 so maybe this is reason    Resolved Hospital Problem list     Assessment & Plan:  Worsening shock, AMS, respiratory failure- MSSA bacteremia, hypoglycemia. Arterial line placed and confirmed severe shock - Intubate, pressors, push epi PRN - Correct electrolyte disturbances, acidemia - Ancef, repeat cultures - Will need additional access, appreciate Michaelyn Barter help  ESRD- MWF dialysis. Worsening BP with trial of HD today. -Will place line for CRRT  Hx CHF/VF/HTN/HLD/PVD/AICD- needs TEE at some point  DM2 with hypoglycemia- monitor  Best Practice (right click and "Reselect all SmartList Selections" daily)   Diet/type: NPO and NPO w/ meds via tube DVT prophylaxis: prophylactic heparin  GI prophylaxis: PPI Lines: N/A Foley:  N/A Code Status:  full  code Last date of multidisciplinary goals of care discussion [Pending]   Patient critically ill due to worsening encephalopathy, septic shock Interventions to address this today intubation, mechanical ventilation Risk of deterioration without these interventions is high  I personally spent 85 minutes providing critical care not including any separately billable procedures  Erskine Emery MD [facility]  Prefer epic messenger for cross cover needs If after hours, please call E-link

## 2021-03-11 NOTE — Progress Notes (Signed)
[facility] Infectious Diseases Follow Up Note  Patient Identification: Patient Name: Timothy Faulkner MRN: [id number] [place] Date: 03/06/2021  1:10 AM Age: 75 y.o.Today's Date: Mar 09, 2021   Reason for Visit: MSSA bacteremia  Active Problems:   Sepsis ([place])  Antibiotics: Vancomycin 7/9                    Zosyn 7/9                    ceftriaxone 7/10  Lines/Tubes: Left subclavian HD catheter, right upper arm AV fistula, arterial site, right arterial site, PIV's  Interval Events: Afebrile, stable leukocytosis   Assessment MSSA bacteremia ( High Grade)  CHF status post AICD ESRD on hemodialysis: Access right upper arm Graft  Thrombocytopenia - in the setting of sepsis ? Confused/Encephalopathy- in the setting of sepsis. CT head 7/10 with no acute findings   Recommendations Switch ceftriaxone to cefazolin with HD Follow-up repeat blood cultures tomorrow TEE is pending  Given MSSA bacteremia and AICD, would presume AICD to be infected. Recommend EP evaluation Monitor CBC and BMP Monitor for metastatic sites of infection including Left UE AV Graft ( currently having HD and could not be examined) Following   Rest of the management as per the primary team. Thank you for the consult. Please page with pertinent questions or concerns.  ______________________________________________________________________ Subjective patient seen and examined at the bedside. He is having HD, appear irritable/confused but answers questions appropriately. Denies back pain or peripheral joints pain/swelling   Vitals BP (!) 100/53   Pulse (!) 47   Temp 97.8 F (36.6 C) (Axillary)   Resp (!) 25   Ht 5\' 8"  (1.727 m)   Wt 71.7 kg Comment: from Mar 2022 records  SpO2 97%   BMI 24.02 kg/m     Physical Exam Constitutional:  Lying in bed, irritable, appears comfortable     Comments:   Cardiovascular:     Rate and Rhythm: Normal rate and regular  rhythm.     Heart sounds:   Pulmonary:     Effort: Pulmonary effort is normal.     Comments:   Abdominal:     Palpations: Abdomen is soft.     Tenderness: Non tender and non distended   Musculoskeletal:        General: No swelling or tenderness. Left BKA  Skin:    Comments: NO obvious lesions or rashes   Neurological:     General: Grossly non focal  Psychiatric:        Mood and Affect: Mood normal. Awake, alert and oriented    Pertinent Microbiology Results for orders placed or performed during the hospital encounter of 03/08/2021  Culture, blood (Routine x 2)     Status: None (Preliminary result)   Collection Time: 02/24/2021  1:48 AM   Specimen: BLOOD LEFT FOREARM  Result Value Ref Range Status   Specimen Description BLOOD LEFT FOREARM  Final   Special Requests   Final    BOTTLES DRAWN AEROBIC AND ANAEROBIC Blood Culture results may not be optimal due to an inadequate volume of blood received in culture bottles   Culture  Setup Time   Final    GRAM POSITIVE COCCI IN CLUSTERS IN BOTH AEROBIC AND ANAEROBIC BOTTLES CRITICAL RESULT CALLED TO, READ BACK BY AND VERIFIED WITH: PHARM D J.MILLEN ON [id number] AT 6440 BY E.PARRISH Performed at [facility], [place] [address], [place], [place] [address]    Culture GRAM POSITIVE  COCCI  Final   Report Status PENDING  Incomplete  Blood Culture ID Panel (Reflexed)     Status: Abnormal   Collection Time: 02/14/2021  1:48 AM  Result Value Ref Range Status   Enterococcus faecalis NOT DETECTED NOT DETECTED Final   Enterococcus Faecium NOT DETECTED NOT DETECTED Final   Listeria monocytogenes NOT DETECTED NOT DETECTED Final   Staphylococcus species DETECTED (A) NOT DETECTED Final    Comment: CRITICAL RESULT CALLED TO, READ BACK BY AND VERIFIED WITH: PHARM D J.MILLEN ON [id number] AT 1438 BY E.PARRISH    Staphylococcus aureus (BCID) DETECTED (A) NOT DETECTED Final    Comment: CRITICAL RESULT CALLED TO, READ BACK BY AND VERIFIED WITH: PHARM  D J.[place] ON [id number] AT 8144 BY E.PARRISH    Staphylococcus epidermidis NOT DETECTED NOT DETECTED Final   Staphylococcus lugdunensis NOT DETECTED NOT DETECTED Final   Streptococcus species NOT DETECTED NOT DETECTED Final   Streptococcus agalactiae NOT DETECTED NOT DETECTED Final   Streptococcus pneumoniae NOT DETECTED NOT DETECTED Final   Streptococcus pyogenes NOT DETECTED NOT DETECTED Final   A.calcoaceticus-baumannii NOT DETECTED NOT DETECTED Final   Bacteroides fragilis NOT DETECTED NOT DETECTED Final   Enterobacterales NOT DETECTED NOT DETECTED Final   Enterobacter cloacae complex NOT DETECTED NOT DETECTED Final   Escherichia coli NOT DETECTED NOT DETECTED Final   Klebsiella aerogenes NOT DETECTED NOT DETECTED Final   Klebsiella oxytoca NOT DETECTED NOT DETECTED Final   Klebsiella pneumoniae NOT DETECTED NOT DETECTED Final   Proteus species NOT DETECTED NOT DETECTED Final   Salmonella species NOT DETECTED NOT DETECTED Final   Serratia marcescens NOT DETECTED NOT DETECTED Final   Haemophilus influenzae NOT DETECTED NOT DETECTED Final   Neisseria meningitidis NOT DETECTED NOT DETECTED Final   Pseudomonas aeruginosa NOT DETECTED NOT DETECTED Final   Stenotrophomonas maltophilia NOT DETECTED NOT DETECTED Final   Candida albicans NOT DETECTED NOT DETECTED Final   Candida auris NOT DETECTED NOT DETECTED Final   Candida glabrata NOT DETECTED NOT DETECTED Final   Candida krusei NOT DETECTED NOT DETECTED Final   Candida parapsilosis NOT DETECTED NOT DETECTED Final   Candida tropicalis NOT DETECTED NOT DETECTED Final   Cryptococcus neoformans/gattii NOT DETECTED NOT DETECTED Final   Meth resistant mecA/C and MREJ NOT DETECTED NOT DETECTED Final    Comment: Performed at [facility], [address], [place], [place] [address]  Culture, blood (Routine x 2)     Status: None (Preliminary result)   Collection Time: 02/15/2021  2:10 AM   Specimen: BLOOD LEFT HAND  Result Value Ref Range  Status   Specimen Description BLOOD LEFT HAND  Final   Special Requests   Final    BOTTLES DRAWN AEROBIC AND ANAEROBIC Blood Culture results may not be optimal due to an inadequate volume of blood received in culture bottles   Culture  Setup Time   Final    GRAM POSITIVE COCCI IN CLUSTERS IN BOTH AEROBIC AND ANAEROBIC BOTTLES CRITICAL VALUE NOTED.  VALUE IS CONSISTENT WITH PREVIOUSLY REPORTED AND CALLED VALUE. Performed at [facility], [place] [address], [place], [place] [address]    Culture GRAM POSITIVE COCCI  Final   Report Status PENDING  Incomplete  Resp Panel by RT-PCR (Flu A&B, Covid) Nasopharyngeal Swab     Status: None   Collection Time: 03/03/2021  3:25 AM   Specimen: Nasopharyngeal Swab; Nasopharyngeal(NP) swabs in vial transport medium  Result Value Ref Range Status   SARS Coronavirus 2 by  RT PCR NEGATIVE NEGATIVE Final    Comment: (NOTE) SARS-CoV-2 target nucleic acids are NOT DETECTED.  The SARS-CoV-2 RNA is generally detectable in upper respiratory specimens during the acute phase of infection. The lowest concentration of SARS-CoV-2 viral copies this assay can detect is 138 copies/mL. A negative result does not preclude SARS-Cov-2 infection and should not be used as the sole basis for treatment or other patient management decisions. A negative result may occur with  improper specimen collection/handling, submission of specimen other than nasopharyngeal swab, presence of viral mutation(s) within the areas targeted by this assay, and inadequate number of viral copies(<138 copies/mL). A negative result must be combined with clinical observations, patient history, and epidemiological information. The expected result is Negative.  Fact Sheet for Patients:  EntrepreneurPulse.com.au  Fact Sheet for Healthcare Providers:  IncredibleEmployment.be  This test is no t yet approved or cleared by the [place] FDA and  has been  authorized for detection and/or diagnosis of SARS-CoV-2 by FDA under an Emergency Use Authorization (EUA). This EUA will remain  in effect (meaning this test can be used) for the duration of the COVID-19 declaration under Section 564(b)(1) of the Act, 21 U.S.C.section 360bbb-3(b)(1), unless the authorization is terminated  or revoked sooner.       Influenza A by PCR NEGATIVE NEGATIVE Final   Influenza B by PCR NEGATIVE NEGATIVE Final    Comment: (NOTE) The Xpert Xpress SARS-CoV-2/FLU/RSV plus assay is intended as an aid in the diagnosis of influenza from Nasopharyngeal swab specimens and should not be used as a sole basis for treatment. Nasal washings and aspirates are unacceptable for Xpert Xpress SARS-CoV-2/FLU/RSV testing.  Fact Sheet for Patients: EntrepreneurPulse.com.au  Fact Sheet for Healthcare Providers: IncredibleEmployment.be  This test is not yet approved or cleared by the [place] FDA and has been authorized for detection and/or diagnosis of SARS-CoV-2 by FDA under an Emergency Use Authorization (EUA). This EUA will remain in effect (meaning this test can be used) for the duration of the COVID-19 declaration under Section 564(b)(1) of the Act, 21 U.S.C. section 360bbb-3(b)(1), unless the authorization is terminated or revoked.  Performed at [facility], [place] [address], [place], [place] [address]      Pertinent Lab. CBC Latest Ref Rng & Units 02/21/2021 02/25/2021 02/22/2021  WBC 4.0 - 10.5 K/uL 12.4(H) - 12.3(H)  Hemoglobin 13.0 - 17.0 g/dL 12.2(L) 13.9 13.3  Hematocrit 39.0 - 52.0 % 39.1 41.0 43.2  Platelets 150 - 400 K/uL 83(L) - 104(L)   CMP Latest Ref Rng & Units 02/14/2021 03/05/2021 02/26/2021  Glucose 70 - 99 mg/dL 84 80 -  BUN 8 - 23 mg/dL 55(H) 49(H) -  Creatinine 0.61 - 1.24 mg/dL 8.93(H) 8.99(H) 8.86(H)  Sodium 135 - 145 mmol/L 134(L) 135 -  Potassium 3.5 - 5.1 mmol/L 5.6(H) 5.7(H) -  Chloride 98 - 111  mmol/L 97(L) 94(L) -  CO2 22 - 32 mmol/L 20(L) 25 -  Calcium 8.9 - 10.3 mg/dL 9.2 9.4 -  Total Protein 6.5 - 8.1 g/dL - - -  Total Bilirubin 0.3 - 1.2 mg/dL - - -  Alkaline Phos 38 - 126 U/L - - -  AST 15 - 41 U/L - - -  ALT 0 - 44 U/L - - -     Pertinent Imaging today Plain films and CT images have been personally visualized and interpreted; radiology reports have been reviewed. Decision making incorporated into the Impression / Recommendations.  TTE 02/17/2021 1. Left ventricular ejection  fraction, by estimation, is 30 to 35%. The left ventricle has severely decreased function. The left ventricle demonstrates global hypokinesis. Left ventricular diastolic parameters are indeterminate. 2. Right ventricular systolic function is normal. The right ventricular size is normal. 3. The mitral valve is normal in structure. Mild mitral valve regurgitation. 4. The aortic valve is calcified. Aortic valve regurgitation is not visualized. Mild aortic valve sclerosis is present, with no evidence of aortic valve stenosis. 5. The inferior vena cava is normal in size with <50% respiratory variability, suggesting right atrial pressure of 8 mmHg.  I spent more than 35 minutes for this patient encounter including review of prior medical records, coordination of care  with greater than 50% of time being face to face/counseling and discussing diagnostics/treatment plan with the patient/family.  Electronically signed by:   Rosiland Oz, MD Infectious Disease Physician [facility] for Infectious Disease Pager: [phone]

## 2021-03-11 DEATH — deceased

## 2021-03-12 ENCOUNTER — Encounter: Payer: Medicare Other | Admitting: Physical Therapy

## 2021-03-14 ENCOUNTER — Encounter: Payer: Medicare Other | Admitting: Physical Therapy

## 2021-03-26 ENCOUNTER — Encounter: Payer: Medicare Other | Admitting: Student

## 2021-05-13 ENCOUNTER — Encounter (INDEPENDENT_AMBULATORY_CARE_PROVIDER_SITE_OTHER): Payer: Medicare Other | Admitting: Ophthalmology
